# Patient Record
Sex: Female | Born: 1935
Health system: Southern US, Community
[De-identification: ages and names within clinical notes are randomized; demographics above are authoritative.]

## PROBLEM LIST (undated history)

## (undated) DIAGNOSIS — G610 Guillain-Barre syndrome: Secondary | ICD-10-CM

## (undated) DIAGNOSIS — A0472 Enterocolitis due to Clostridium difficile, not specified as recurrent: Secondary | ICD-10-CM

## (undated) DIAGNOSIS — H9313 Tinnitus, bilateral: Secondary | ICD-10-CM

## (undated) DIAGNOSIS — K7689 Other specified diseases of liver: Secondary | ICD-10-CM

## (undated) DIAGNOSIS — R011 Cardiac murmur, unspecified: Secondary | ICD-10-CM

## (undated) DIAGNOSIS — C539 Malignant neoplasm of cervix uteri, unspecified: Secondary | ICD-10-CM

## (undated) DIAGNOSIS — T8859XA Other complications of anesthesia, initial encounter: Secondary | ICD-10-CM

## (undated) DIAGNOSIS — I82409 Acute embolism and thrombosis of unspecified deep veins of unspecified lower extremity: Secondary | ICD-10-CM

## (undated) DIAGNOSIS — C801 Malignant (primary) neoplasm, unspecified: Secondary | ICD-10-CM

## (undated) DIAGNOSIS — T4145XA Adverse effect of unspecified anesthetic, initial encounter: Secondary | ICD-10-CM

## (undated) DIAGNOSIS — C189 Malignant neoplasm of colon, unspecified: Secondary | ICD-10-CM

## (undated) DIAGNOSIS — Z5189 Encounter for other specified aftercare: Secondary | ICD-10-CM

## (undated) DIAGNOSIS — D649 Anemia, unspecified: Secondary | ICD-10-CM

## (undated) DIAGNOSIS — I10 Essential (primary) hypertension: Secondary | ICD-10-CM

## (undated) DIAGNOSIS — I639 Cerebral infarction, unspecified: Secondary | ICD-10-CM

## (undated) HISTORY — DX: Cardiac murmur, unspecified: R01.1

## (undated) HISTORY — DX: Encounter for other specified aftercare: Z51.89

## (undated) HISTORY — DX: Anemia, unspecified: D64.9

## (undated) HISTORY — DX: Malignant neoplasm of colon, unspecified: C18.9

## (undated) HISTORY — PX: ABDOMINAL HYSTERECTOMY: SHX81

## (undated) HISTORY — DX: Malignant neoplasm of cervix uteri, unspecified: C53.9

---

## 1966-08-15 HISTORY — PX: TUBAL LIGATION: SHX77

## 1970-08-15 DIAGNOSIS — C539 Malignant neoplasm of cervix uteri, unspecified: Secondary | ICD-10-CM

## 1970-08-15 HISTORY — DX: Malignant neoplasm of cervix uteri, unspecified: C53.9

## 1979-08-16 HISTORY — PX: BACK SURGERY: SHX140

## 2012-04-30 ENCOUNTER — Other Ambulatory Visit (HOSPITAL_COMMUNITY)
Admission: RE | Admit: 2012-04-30 | Discharge: 2012-04-30 | Disposition: A | Discharge status: Home / Self Care | Payer: BC Managed Care – PPO | Source: Ambulatory Visit | Attending: Family Medicine | Admitting: Family Medicine

## 2012-04-30 DIAGNOSIS — Z124 Encounter for screening for malignant neoplasm of cervix: Secondary | ICD-10-CM | POA: Insufficient documentation

## 2013-08-15 DIAGNOSIS — Z5189 Encounter for other specified aftercare: Secondary | ICD-10-CM

## 2013-08-15 DIAGNOSIS — C189 Malignant neoplasm of colon, unspecified: Secondary | ICD-10-CM

## 2013-08-15 HISTORY — DX: Encounter for other specified aftercare: Z51.89

## 2013-08-15 HISTORY — DX: Malignant neoplasm of colon, unspecified: C18.9

## 2013-09-03 ENCOUNTER — Emergency Department (HOSPITAL_COMMUNITY): Payer: Medicare Other

## 2013-09-03 ENCOUNTER — Observation Stay (HOSPITAL_COMMUNITY)
Admission: EM | Admit: 2013-09-03 | Discharge: 2013-09-05 | Disposition: A | Discharge status: Home / Self Care | Payer: Medicare Other | Attending: Internal Medicine | Admitting: Internal Medicine

## 2013-09-03 ENCOUNTER — Encounter (HOSPITAL_COMMUNITY): Payer: Self-pay | Admitting: Emergency Medicine

## 2013-09-03 DIAGNOSIS — I10 Essential (primary) hypertension: Secondary | ICD-10-CM | POA: Insufficient documentation

## 2013-09-03 DIAGNOSIS — I059 Rheumatic mitral valve disease, unspecified: Secondary | ICD-10-CM | POA: Diagnosis not present

## 2013-09-03 DIAGNOSIS — J449 Chronic obstructive pulmonary disease, unspecified: Secondary | ICD-10-CM | POA: Diagnosis not present

## 2013-09-03 DIAGNOSIS — D649 Anemia, unspecified: Secondary | ICD-10-CM | POA: Diagnosis present

## 2013-09-03 DIAGNOSIS — J4489 Other specified chronic obstructive pulmonary disease: Secondary | ICD-10-CM | POA: Insufficient documentation

## 2013-09-03 DIAGNOSIS — R55 Syncope and collapse: Secondary | ICD-10-CM | POA: Diagnosis present

## 2013-09-03 DIAGNOSIS — R61 Generalized hyperhidrosis: Secondary | ICD-10-CM | POA: Diagnosis not present

## 2013-09-03 DIAGNOSIS — I951 Orthostatic hypotension: Secondary | ICD-10-CM

## 2013-09-03 DIAGNOSIS — C539 Malignant neoplasm of cervix uteri, unspecified: Secondary | ICD-10-CM | POA: Insufficient documentation

## 2013-09-03 DIAGNOSIS — D509 Iron deficiency anemia, unspecified: Secondary | ICD-10-CM | POA: Diagnosis not present

## 2013-09-03 DIAGNOSIS — R0609 Other forms of dyspnea: Secondary | ICD-10-CM | POA: Diagnosis not present

## 2013-09-03 DIAGNOSIS — I079 Rheumatic tricuspid valve disease, unspecified: Secondary | ICD-10-CM | POA: Diagnosis not present

## 2013-09-03 DIAGNOSIS — R0989 Other specified symptoms and signs involving the circulatory and respiratory systems: Secondary | ICD-10-CM | POA: Insufficient documentation

## 2013-09-03 HISTORY — DX: Malignant (primary) neoplasm, unspecified: C80.1

## 2013-09-03 HISTORY — DX: Essential (primary) hypertension: I10

## 2013-09-03 LAB — CBC WITH DIFFERENTIAL/PLATELET
Basophils Absolute: 0.1 10*3/uL (ref 0.0–0.1)
Basophils Absolute: 0.1 10*3/uL (ref 0.0–0.1)
Basophils Relative: 1 % (ref 0–1)
Basophils Relative: 1 % (ref 0–1)
EOS PCT: 4 % (ref 0–5)
Eosinophils Absolute: 0.2 10*3/uL (ref 0.0–0.7)
Eosinophils Absolute: 0.3 10*3/uL (ref 0.0–0.7)
Eosinophils Relative: 3 % (ref 0–5)
HCT: 24.3 % — ABNORMAL LOW (ref 36.0–46.0)
HEMATOCRIT: 26.7 % — AB (ref 36.0–46.0)
HEMOGLOBIN: 8.2 g/dL — AB (ref 12.0–15.0)
Hemoglobin: 7.2 g/dL — ABNORMAL LOW (ref 12.0–15.0)
LYMPHS ABS: 1.2 10*3/uL (ref 0.7–4.0)
LYMPHS ABS: 2 10*3/uL (ref 0.7–4.0)
LYMPHS PCT: 24 % (ref 12–46)
Lymphocytes Relative: 15 % (ref 12–46)
MCH: 19 pg — ABNORMAL LOW (ref 26.0–34.0)
MCH: 20.3 pg — ABNORMAL LOW (ref 26.0–34.0)
MCHC: 29.6 g/dL — ABNORMAL LOW (ref 30.0–36.0)
MCHC: 30.7 g/dL (ref 30.0–36.0)
MCV: 64.1 fL — AB (ref 78.0–100.0)
MCV: 66.1 fL — AB (ref 78.0–100.0)
MONOS PCT: 8 % (ref 3–12)
Monocytes Absolute: 0.5 10*3/uL (ref 0.1–1.0)
Monocytes Absolute: 0.7 10*3/uL (ref 0.1–1.0)
Monocytes Relative: 6 % (ref 3–12)
NEUTROS ABS: 5.1 10*3/uL (ref 1.7–7.7)
Neutro Abs: 5.7 10*3/uL (ref 1.7–7.7)
Neutrophils Relative %: 75 % (ref 43–77)
Neutrophils Relative %: 63 % (ref 43–77)
PLATELETS: 402 10*3/uL — AB (ref 150–400)
Platelets: 380 10*3/uL (ref 150–400)
RBC: 3.79 MIL/uL — AB (ref 3.87–5.11)
RBC: 4.04 MIL/uL (ref 3.87–5.11)
RDW: 16.7 % — ABNORMAL HIGH (ref 11.5–15.5)
RDW: 18.7 % — AB (ref 11.5–15.5)
WBC: 7.7 10*3/uL (ref 4.0–10.5)
WBC: 8.2 10*3/uL (ref 4.0–10.5)

## 2013-09-03 LAB — COMPREHENSIVE METABOLIC PANEL
ALK PHOS: 41 U/L (ref 39–117)
ALT: 9 U/L (ref 0–35)
AST: 19 U/L (ref 0–37)
Albumin: 3.3 g/dL — ABNORMAL LOW (ref 3.5–5.2)
BUN: 19 mg/dL (ref 6–23)
CHLORIDE: 104 meq/L (ref 96–112)
CO2: 22 mEq/L (ref 19–32)
CREATININE: 0.83 mg/dL (ref 0.50–1.10)
Calcium: 9 mg/dL (ref 8.4–10.5)
GFR calc Af Amer: 78 mL/min — ABNORMAL LOW (ref >90)
GFR, EST NON AFRICAN AMERICAN: 67 mL/min — AB (ref >90)
Glucose, Bld: 113 mg/dL — ABNORMAL HIGH (ref 70–99)
Potassium: 4.9 mEq/L (ref 3.7–5.3)
SODIUM: 140 meq/L (ref 137–147)
Total Bilirubin: 0.3 mg/dL (ref 0.3–1.2)
Total Protein: 6.7 g/dL (ref 6.0–8.3)

## 2013-09-03 LAB — PROTIME-INR
INR: 1.04 (ref 0.00–1.49)
Prothrombin Time: 13.4 seconds (ref 11.6–15.2)

## 2013-09-03 LAB — IRON AND TIBC
IRON: 10 ug/dL — AB (ref 42–135)
Saturation Ratios: 2 % — ABNORMAL LOW (ref 20–55)
TIBC: 444 ug/dL (ref 250–470)
UIBC: 434 ug/dL — AB (ref 125–400)

## 2013-09-03 LAB — TROPONIN I: Troponin I: <0.3 ng/mL (ref <0.30)

## 2013-09-03 LAB — RETICULOCYTES
RBC.: 3.77 MIL/uL — ABNORMAL LOW (ref 3.87–5.11)
Retic Count, Absolute: 41.5 10*3/uL (ref 19.0–186.0)
Retic Ct Pct: 1.1 % (ref 0.4–3.1)

## 2013-09-03 LAB — PREPARE RBC (CROSSMATCH)

## 2013-09-03 LAB — ABO/RH: ABO/RH(D): A POS

## 2013-09-03 LAB — D-DIMER, QUANTITATIVE: D-Dimer, Quant: 0.68 ug/mL-FEU — ABNORMAL HIGH (ref 0.00–0.48)

## 2013-09-03 LAB — OCCULT BLOOD, POC DEVICE: Fecal Occult Bld: NEGATIVE

## 2013-09-03 LAB — FERRITIN: Ferritin: 3 ng/mL — ABNORMAL LOW (ref 10–291)

## 2013-09-03 LAB — VITAMIN B12: Vitamin B-12: 345 pg/mL (ref 211–911)

## 2013-09-03 LAB — FOLATE

## 2013-09-03 LAB — POCT I-STAT TROPONIN I: Troponin i, poc: 0.01 ng/mL (ref 0.00–0.08)

## 2013-09-03 MED ORDER — ACETAMINOPHEN 650 MG RE SUPP: 650.0000 mg | Freq: Four times a day (QID) | RECTAL | Status: DC | PRN

## 2013-09-03 MED ORDER — SODIUM CHLORIDE 0.9 % IV SOLN
INTRAVENOUS | Status: DC
  Administered 2013-09-03 – 2013-09-05 (×3): via INTRAVENOUS

## 2013-09-03 MED ORDER — ACETAMINOPHEN 325 MG PO TABS: 650.0000 mg | ORAL_TABLET | Freq: Four times a day (QID) | ORAL | Status: DC | PRN

## 2013-09-03 NOTE — ED Provider Notes (Signed)
CSN: [id number]     Arrival date & time 09/03/13  1142 History   First MD Initiated Contact with Patient 09/03/13 1147     Chief Complaint  Patient presents with  . Near Syncope   (Consider location/radiation/quality/duration/timing/severity/associated sxs/prior Treatment) HPI  Is a 78 year old female who presents the emergency department chief complaint of near syncope.  The patient has a past medical history of hypertension.  She is otherwise healthy and takes no medications.  Patient states that today while at her church she picked up a stool to move it to another room.  She states that by the time she got there she became extremely short of breath, diaphoretic, very lightheaded.  Patient states she had to sit down.  She felt as if she were going to lose consciousness.  Her daughter states that she was unable to get her words out.  Patient did not fully collapse.  Lasted less than 30 seconds.  Patient was brought to the ED via EMS.  She denies any racing or skipping in her heart prior to the event.  She has had no recent illnesses, denies any melena or hematochezia.  No history of anemia.  The patient does endorse progressively worsening shortness of breath since the end of summer.  She states that her dyspnea is exertional.  Patient states she was walking to have mild daily before the end of summer when it became too hot.  She has not been exercising since that time.  She did not feel that her shortness of breath is due to deconditioning.  Patient is able to complete ADLs.  She states that dyspnea occurs with forceful movement such as carrying heavy objects or pushing her garbage can into the driveway.  She states this is new and has never happened before.  The patient does have a murmur which was evaluated by cardiologist. Denies fevers, chills, myalgias, arthralgias. Denies DOE, SOB, chest tightness or pressure, radiation to left arm, jaw or back, or diaphoresis. Denies dysuria, flank pain,  suprapubic pain, frequency, urgency, or hematuria. Denies headaches weakness, visual disturbances. Denies abdominal pain, nausea, vomiting, diarrhea or constipation.   Past Medical History  Diagnosis Date  . Hypertension   . Cancer    Past Surgical History  Procedure Laterality Date  . Back surgery     Family History  Problem Relation Age of Onset  . Diabetes Sister   . Diabetes Brother   . Heart failure Brother   . Hypertension Brother    History  Substance Use Topics  . Smoking status: Never Smoker   . Smokeless tobacco: Never Used  . Alcohol Use: No   OB History   Grav Para Term Preterm Abortions TAB SAB Ect Mult Living                 Review of Systems Ten systems reviewed and are negative for acute change, except as noted in the HPI.   Allergies  Review of patient's allergies indicates no known allergies.  Home Medications  No current outpatient prescriptions on file. BP 132/76  Pulse 84  Resp 14  SpO2 100% Physical Exam Physical Exam  Nursing note and vitals reviewed. Constitutional: She is oriented to person, place, and time. She appears well-developed and well-nourished. No distress.  HENT:  Head: Normocephalic and atraumatic.  Eyes: Conjunctivae normal and EOM are normal. Pupils are equal, round, and reactive to light. No scleral icterus.  Neck: Normal range of motion.  Cardiovascular: Normal rate, regular rhythm and  normal heart sounds.  Exam reveals no gallop and no friction rub.   Systolic  Pulmonary/Chest: Effort normal and breath sounds normal. No respiratory distress.  Abdominal: Soft. Bowel sounds are normal. She exhibits no distension and no mass. There is no tenderness. There is no guarding.  Neurological: She is alert and oriented to person, place, and time. Marland Kitchen Speech is clear and goal oriented, follows commands Major Cranial nerves without deficit, no facial droop Normal strength in upper and lower extremities bilaterally including  dorsiflexion and plantar flexion, strong and equal grip strength Sensation normal to light and sharp touch Moves extremities without ataxia, coordination intact Normal finger to nose and rapid alternating movements Neg romberg, no pronator drift Normal gait Normal heel-shin and balance Skin: Skin is warm and dry. She is not diaphoretic.    ED Course  Procedures (including critical care time) Labs Review Labs Reviewed  CBC WITH DIFFERENTIAL - Abnormal; Notable for the following:    RBC 3.79 (*)    Hemoglobin 7.2 (*)    HCT 24.3 (*)    MCV 64.1 (*)    MCH 19.0 (*)    MCHC 29.6 (*)    RDW 16.7 (*)    Platelets 402 (*)    All other components within normal limits  COMPREHENSIVE METABOLIC PANEL - Abnormal; Notable for the following:    Glucose, Bld 113 (*)    Albumin 3.3 (*)    GFR calc non Af Amer 67 (*)    GFR calc Af Amer 78 (*)    All other components within normal limits  PROTIME-INR  VITAMIN B12  FOLATE  IRON AND TIBC  FERRITIN  RETICULOCYTES  POCT I-STAT TROPONIN I  OCCULT BLOOD, POC DEVICE  POCT CBG (FASTING - GLUCOSE)-MANUAL ENTRY  TYPE AND SCREEN  PREPARE RBC (CROSSMATCH)  ABO/RH   Imaging Review No results found.  EKG Interpretation   None       MDM   1. Symptomatic anemia   2. Orthostasis    1:00 PM Patient with near syncope. Normal neuro. Assessment appears to have low hemoglobin.  At 7.2.  I've ordered occult stool card.   1:29 PM Stool negative. She is orthostatic. I feel she will need admission for transfusion and symptomatic anemia  2:10 PM Patient will be admitted by Dr. Clementeen Graham. Patient agrees with poc. The patient appears reasonably stabilized for admission considering the current resources, flow, and capabilities available in the ED at this time, and I doubt any other [facility] requiring further screening and/or treatment in the ED prior to admission.    Margarita Mail, PA-C 09/03/13 1411

## 2013-09-03 NOTE — ED Notes (Signed)
Per EMS- patient was at church working and suddenly became weak, pale nauseated, and diaphoretic, but did not pass out or hit her head. EMS stated paatient was awake and alert upon arrival. 12 lead unremarkable.

## 2013-09-03 NOTE — Progress Notes (Signed)
Report from Freda Munro, [place] in ED.

## 2013-09-03 NOTE — ED Notes (Signed)
Bed: HK32 Expected date:  Expected time:  Means of arrival:  Comments: EMS-syncopal episode

## 2013-09-03 NOTE — H&P (Addendum)
[facility] History and Physical  Kasen Sako UXN:[id number] DOB: 04-17-1936 DOA: 09/03/2013  Referring physician: ED  PCP: Alessandra Grout, MD  Chief Complaint:  Near syncope  HPI:  78 year old female with a history of hypertension not on any medications who was doing regular household chores at home and moving a stool in the kitchen suddenly felt short of breath and diaphoretic with dizziness and had a near-syncopal event. Her daughter held her before she slumped on the down and the symptoms lasted for less than 1 minute. Patient reports that she could hear her daughter talking. She denies any chest pain, palpitations, headache, blurred vision, denies any nausea or vomiting. Patient reports dyspnea on exertion and fatigue for past 5-6 months. She denies change in her weight or appetite. She denies taking any NSAID's. denies urinary symptoms. Denies dark stools or hematemesis. She denies any leg swellings or recent travel. Denies history of blood clots. Denies any sick contacts. She has not had EGD or colonoscopy in the past. Denies history of colon cancer in family.  Course in the ED orthostatic BP positive in ED. CBC showed hb of 7.2 and suggestive of microcytic anemia. Stool foe occult blood was negative. CXR negative. EKG was unremarkable. l. [facility] consulted for admission under observation on telemetry.   Review of Systems:  Constitutional: Diaphoresis and fatigue, Denies fever, chills, , appetite change. HEENT: Denies photophobia, eye pain, redness, hearing loss, ear pain, congestion, sore throat, rhinorrhea, sneezing, mouth sores, trouble swallowing, neck pain, neck stiffness and tinnitus.   Respiratory: Dyspnea on exertion, Denies  cough, chest tightness,  and wheezing.   Cardiovascular: Denies chest pain, palpitations and leg swelling.  Gastrointestinal: Denies nausea, vomiting, abdominal pain, diarrhea, constipation, blood in stool and abdominal distention.   Genitourinary: Denies dysuria, urgency, frequency, hematuria, flank pain and difficulty urinating.  Endocrine: Denies: hot or cold intolerance, sweats, changes in hair or nails, polyuria, polydipsia. Musculoskeletal: Denies myalgias, back pain, joint swelling, arthralgias and gait problem.  Skin: Denies pallor, rash and wound.  Neurological:  near syncope, dizziness, deniesseizures, syncope, weakness, light-headedness, numbness and headaches.  Psychiatric/Behavioral: Denies confusion, nervousness, sleep disturbance and agitation   Past Medical History  Diagnosis Date  . Hypertension   . Cancer    Past Surgical History  Procedure Laterality Date  . Back surgery     Social History:  reports that she has never smoked. She has never used smokeless tobacco. She reports that she does not drink alcohol or use illicit drugs.  No Known Allergies  Family History  Problem Relation Age of Onset  . Diabetes Sister   . Diabetes Brother   . Heart failure Brother   . Hypertension Brother     Prior to Admission medications   Not on File    Physical Exam:  Filed Vitals:   09/03/13 1232 09/03/13 1300 09/03/13 1459 09/03/13 1513  BP: 123/73 128/67 156/64 146/80  Pulse: 98 77 92 87  Temp:   98.2 F (36.8 C) 98.6 F (37 C)  TempSrc:   Oral Oral  Resp:  16 18 18   Height:   5' 1.5" (1.562 m)   Weight:   61.236 kg (135 lb)   SpO2:  100% 100% 100%    Constitutional: Vital signs reviewed.  Patient is an elderly female lying in bed in no acute distress HEENT: Pallor present, no icterus, moist oral mucosa Cardiovascular: RRR, S1 normal, S2 normal, no MRG, Pulmonary/Chest: CTAB, no wheezes, rales, or rhonchi Abdominal: Soft. Non-tender, non-distended, bowel  sounds are normal, no masses, organomegaly, or guarding present.   extremities: Warm, no edema CNS: AAO x3  Labs on Admission:  Basic Metabolic Panel:  Recent Labs Lab 09/03/13 1232  NA 140  K 4.9  CL 104  CO2 22  GLUCOSE  113*  BUN 19  CREATININE 0.83  CALCIUM 9.0   Liver Function Tests:  Recent Labs Lab 09/03/13 1232  AST 19  ALT 9  ALKPHOS 41  BILITOT 0.3  PROT 6.7  ALBUMIN 3.3*   No results found for this basename: LIPASE, AMYLASE,  in the last 168 hours No results found for this basename: AMMONIA,  in the last 168 hours CBC:  Recent Labs Lab 09/03/13 1232  WBC 7.7  NEUTROABS 5.7  HGB 7.2*  HCT 24.3*  MCV 64.1*  PLT 402*   Cardiac Enzymes: No results found for this basename: CKTOTAL, CKMB, CKMBINDEX, TROPONINI,  in the last 168 hours BNP: No components found with this basename: POCBNP,  CBG: No results found for this basename: GLUCAP,  in the last 168 hours  Radiological Exams on Admission: Dg Chest 2 View  09/03/2013   CLINICAL DATA:  Near syncope  EXAM: CHEST  2 VIEW  COMPARISON:  None.  FINDINGS: Mild pulmonary hyperinflation is seen, consistent with COPD. No evidence of pulmonary infiltrate or edema. No evidence pleural effusion. Heart size is within normal limits. No mass or lymphadenopathy identified. No suspicious bone lesions seen.  IMPRESSION: COPD.  No active disease.   Electronically Signed   By: Earle Gell M.D.   On: 09/03/2013 13:36    EKG: Sinus rhythm, no ST-T changes  Assessment/Plan  Principal Problem: Near syncope Patient was to for orthostatic blood pressure and microcytic anemia. Monitor on telemetry. We'll check serial troponins. Check d-dimer.  Transfuse 1 unit PRBC. Continue IV normal saline -stool for occult  blood was negative in ED. Symptoms of dyspnea on exertion and fatigue ongoing for few  Months. Monitor serial H&H. Check iron panel  ferritin, B12 , TSH -recheck orthostasis in am. Has not had GI w/up in the past and will benefit from outpt EGD/ colonoscopy.     DVT prophylaxis: SCDs  Code Status: full code Family Communication: Daughter at bedside Disposition Plan: home Tomorrow if H&H stable  Louellen Molder [facility] Pager [phone]  If 7PM-7AM, please contact night-coverage [email or web address] Password [facility] 09/03/2013, 3:36 PM   Total time spent: 45 minutes

## 2013-09-03 NOTE — Progress Notes (Signed)
Pt arrived from ED on stretcher. Amb to bed w/ one stand by assist. Oriented to callbell and environment. POC discussed w/ pt and dtr. Aware of need for blood transfusion and agreeable. VSS.

## 2013-09-04 ENCOUNTER — Encounter (HOSPITAL_COMMUNITY): Payer: Self-pay | Admitting: Radiology

## 2013-09-04 ENCOUNTER — Observation Stay (HOSPITAL_COMMUNITY): Payer: Medicare Other

## 2013-09-04 DIAGNOSIS — I951 Orthostatic hypotension: Secondary | ICD-10-CM | POA: Diagnosis not present

## 2013-09-04 DIAGNOSIS — D509 Iron deficiency anemia, unspecified: Secondary | ICD-10-CM | POA: Diagnosis not present

## 2013-09-04 DIAGNOSIS — C539 Malignant neoplasm of cervix uteri, unspecified: Secondary | ICD-10-CM | POA: Diagnosis not present

## 2013-09-04 DIAGNOSIS — R0609 Other forms of dyspnea: Secondary | ICD-10-CM | POA: Diagnosis not present

## 2013-09-04 LAB — TYPE AND SCREEN
ABO/RH(D): A POS
Antibody Screen: NEGATIVE
UNIT DIVISION: 0

## 2013-09-04 LAB — GLUCOSE, CAPILLARY: GLUCOSE-CAPILLARY: 96 mg/dL (ref 70–99)

## 2013-09-04 LAB — TROPONIN I
Troponin I: <0.3 ng/mL (ref <0.30)
Troponin I: <0.3 ng/mL (ref <0.30)

## 2013-09-04 LAB — TSH: TSH: 1.571 u[IU]/mL (ref 0.350–4.500)

## 2013-09-04 MED ORDER — SODIUM CHLORIDE 0.9 % IV SOLN
125.0000 mg | Freq: Once | INTRAVENOUS | Status: AC
  Administered 2013-09-04: 125 mg via INTRAVENOUS
  Filled 2013-09-04: qty 10

## 2013-09-04 MED ORDER — IOHEXOL 350 MG/ML SOLN
100.0000 mL | Freq: Once | INTRAVENOUS | Status: AC | PRN
  Administered 2013-09-04: 100 mL via INTRAVENOUS

## 2013-09-04 MED ORDER — FERROUS SULFATE 325 (65 FE) MG PO TABS
325.0000 mg | ORAL_TABLET | Freq: Two times a day (BID) | ORAL | Status: DC
  Administered 2013-09-04 – 2013-09-05 (×2): 325 mg via ORAL
  Filled 2013-09-04 (×4): qty 1

## 2013-09-04 NOTE — Progress Notes (Signed)
[facility] PROGRESS NOTE  Cathy Crounse POE:[id number] DOB: January 17, 1936 DOA: 09/03/2013 PCP: [name], MD  Assessment/Plan: Orthostatic hypotension -Continue IV fluids Near syncope/dyspnea on exertion -Echocardiogram -Partly due to symptomatic anemia -Transfused 1 unit PRBC -FOBT negative -Troponin negative x3 Symptomatic anemia -Partly due to iron deficiency-- iron saturation 2% -Nulecit IV x 1 -Start ferrous sulfate 325 mg twice a day -FOBT negative Elevated d-dimer -CT angiogram chest   Family Communication:   No family at beside Disposition Plan:   Home when medically stable       Procedures/Studies: Dg Chest 2 View  09/03/2013   CLINICAL DATA:  Near syncope  EXAM: CHEST  2 VIEW  COMPARISON:  None.  FINDINGS: Mild pulmonary hyperinflation is seen, consistent with COPD. No evidence of pulmonary infiltrate or edema. No evidence pleural effusion. Heart size is within normal limits. No mass or lymphadenopathy identified. No suspicious bone lesions seen.  IMPRESSION: COPD.  No active disease.   Electronically Signed   By: Earle Gell M.D.   On: 09/03/2013 13:36         Subjective: Patient is feeling better today. Denies fevers, chills, chest discomfort, shortness of breath, dizziness, nausea, vomiting, diarrhea, abdominal pain, dysuria, hematuria.  Objective: Filed Vitals:   09/03/13 1745 09/03/13 1812 09/03/13 2037 09/04/13 0443  BP: 138/69 150/64 133/67 149/72  Pulse: 86 84 66 68  Temp: 98.2 F (36.8 C) 98.2 F (36.8 C) 98.7 F (37.1 C) 98.4 F (36.9 C)  TempSrc: Oral Oral Oral Oral  Resp: 18 16 17 16   Height:      Weight:      SpO2: 99%  97% 99%    Intake/Output Summary (Last 24 hours) at 09/04/13 1306 Last data filed at 09/04/13 0559  Gross per 24 hour  Intake   1820 ml  Output      0 ml  Net   1820 ml   Weight change:  Exam:   General:  Pt is alert, follows commands appropriately, not in acute distress  HEENT: No icterus,  No thrush, /AT  Cardiovascular: RRR, S1/S2, no rubs, no gallops  Respiratory: CTA bilaterally, no wheezing, no crackles, no rhonchi  Abdomen: Soft/+BS, non tender, non distended, no guarding  Extremities: No edema, No lymphangitis, No petechiae, No rashes, no synovitis  Data Reviewed: Basic Metabolic Panel:  Recent Labs Lab 09/03/13 1232  NA 140  K 4.9  CL 104  CO2 22  GLUCOSE 113*  BUN 19  CREATININE 0.83  CALCIUM 9.0   Liver Function Tests:  Recent Labs Lab 09/03/13 1232  AST 19  ALT 9  ALKPHOS 41  BILITOT 0.3  PROT 6.7  ALBUMIN 3.3*   No results found for this basename: LIPASE, AMYLASE,  in the last 168 hours No results found for this basename: AMMONIA,  in the last 168 hours CBC:  Recent Labs Lab 09/03/13 1232 09/03/13 1954  WBC 7.7 8.2  NEUTROABS 5.7 5.1  HGB 7.2* 8.2*  HCT 24.3* 26.7*  MCV 64.1* 66.1*  PLT 402* 380   Cardiac Enzymes:  Recent Labs Lab 09/03/13 1954 09/04/13 0201 09/04/13 0755  TROPONINI <0.30 <0.30 <0.30   BNP: No components found with this basename: POCBNP,  CBG:  Recent Labs Lab 09/04/13 1130  GLUCAP 96    No results found for this or any previous visit (from the past 240 hour(s)).   Scheduled Meds: . ferric gluconate (FERRLECIT/NULECIT) IV  125 mg Intravenous Once  . ferrous sulfate  325 mg Oral BID  WC   Continuous Infusions: . sodium chloride 100 mL/hr at 09/04/13 0559     Laporscha Linehan, DO  [facility] Pager [phone]  If 7PM-7AM, please contact night-coverage [email or web address] Password [email or web address] 09/04/2013, 1:06 PM   LOS: 1 day

## 2013-09-05 DIAGNOSIS — I059 Rheumatic mitral valve disease, unspecified: Secondary | ICD-10-CM

## 2013-09-05 LAB — CBC
HEMATOCRIT: 26.8 % — AB (ref 36.0–46.0)
Hemoglobin: 7.8 g/dL — ABNORMAL LOW (ref 12.0–15.0)
MCH: 19.7 pg — ABNORMAL LOW (ref 26.0–34.0)
MCHC: 29.1 g/dL — ABNORMAL LOW (ref 30.0–36.0)
MCV: 67.8 fL — ABNORMAL LOW (ref 78.0–100.0)
PLATELETS: 329 10*3/uL (ref 150–400)
RBC: 3.95 MIL/uL (ref 3.87–5.11)
RDW: 18.8 % — ABNORMAL HIGH (ref 11.5–15.5)
WBC: 6.8 10*3/uL (ref 4.0–10.5)

## 2013-09-05 LAB — BASIC METABOLIC PANEL
BUN: 8 mg/dL (ref 6–23)
CHLORIDE: 108 meq/L (ref 96–112)
CO2: 25 meq/L (ref 19–32)
Calcium: 8 mg/dL — ABNORMAL LOW (ref 8.4–10.5)
Creatinine, Ser: 0.79 mg/dL (ref 0.50–1.10)
GFR calc non Af Amer: 79 mL/min — ABNORMAL LOW (ref >90)
Glucose, Bld: 91 mg/dL (ref 70–99)
Potassium: 4.2 mEq/L (ref 3.7–5.3)
Sodium: 141 mEq/L (ref 137–147)

## 2013-09-05 MED ORDER — FERROUS SULFATE 325 (65 FE) MG PO TABS: 325.0000 mg | ORAL_TABLET | Freq: Two times a day (BID) | ORAL | Status: DC

## 2013-09-05 NOTE — ED Provider Notes (Signed)
Medical screening examination/treatment/procedure(s) were conducted as a shared visit with non-physician practitioner(s) and myself.  I personally evaluated the patient during the encounter.  EKG Interpretation    Date/Time:  Tuesday September 03 2013 12:02:08 EST Ventricular Rate:  84 PR Interval:  157 QRS Duration: 89 QT Interval:  379 QTC Calculation: 448 R Axis:   38 Text Interpretation:  Sinus rhythm Abnormal inferior Q waves ED PHYSICIAN INTERPRETATION AVAILABLE IN [facility] Confirmed by TEST, RECORD ([id number]) on 09/05/2013 10:15:33 AM            Cynthia [name] is a 78 y.o. female hx of anemia, HTN here with near syncope. SOB with exertion for several weeks. Today, almost passed out. No black stool, no vomiting. No chest pain. Hg 7.2, occ neg. I think she has symptomatic anemia, will transfuse and admit.   CRITICAL CARE Performed by: Darl Householder, DAVID   Total critical care time: 30 min   Critical care time was exclusive of separately billable procedures and treating other patients.  Critical care was necessary to treat or prevent imminent or life-threatening deterioration.  Critical care was time spent personally by me on the following activities: development of treatment plan with patient and/or surrogate as well as nursing, discussions with consultants, evaluation of patient's response to treatment, examination of patient, obtaining history from patient or surrogate, ordering and performing treatments and interventions, ordering and review of laboratory studies, ordering and review of radiographic studies, pulse oximetry and re-evaluation of patient's condition.     Wandra Arthurs, MD 09/05/13 [phone]

## 2013-09-05 NOTE — Progress Notes (Signed)
Echocardiogram 2D Echocardiogram has been performed.  Joelene Millin 09/05/2013, 9:03 AM

## 2013-09-05 NOTE — Progress Notes (Signed)
D/C instructions reviewed w/ pt and dtr. All questions answered, no further questions. Pt d/c in w/c by NT in stable condition. Pt in possession of d/c instructions, script for iron, and all personal belongings. Pt d/c to dtr's car.

## 2013-09-05 NOTE — Discharge Summary (Signed)
Physician Discharge Summary  Cynthia [name] UUV:[id number] DOB: [birth year] DOA: 09/03/2013  PCP: [name], MD  Admit date: 09/03/2013 Discharge date: 09/05/2013  Recommendations for Outpatient Follow-up:  1. Pt will need to follow up with PCP in 2 weeks post discharge 2. Please obtain BMP to evaluate electrolytes and kidney function 3. Please also check CBC to evaluate Hg and Hct levels 4. Please followup on echocardiogram results performed 09/05/2013  Discharge Diagnoses:  Principal Problem:   Symptomatic anemia Active Problems:   Orthostasis   Near syncope   Iron deficiency anemia Orthostatic hypotension  -Continue IV fluids  -Clinically improved with fluids Near syncope/dyspnea on exertion  -Echocardiogram --results pending at the time of discharge -Partly due to symptomatic anemia  -Transfused 1 unit PRBC--> hemoglobin remained stable after transfusion  -FOBT negative  -Troponin negative x3  -Symptomatically improved after blood transfusion Symptomatic anemia  -Partly due to iron deficiency-- iron saturation 2%  -Nulecit IV x 1  -Start ferrous sulfate 325 mg twice a day  -FOBT negative  Elevated d-dimer  -CT angiogram chest--negative for pulmonary embolus -Patchy airspace disease on CT angiogram of the chest likely represent atelectasis as the patient has no fever, dyspnea, or leukocytosis  Discharge Condition: Stable  Disposition:  home  Diet: Regular Wt Readings from Last 3 Encounters:  09/03/13 61.236 kg (135 lb)    History of present illness:  78 year old female with a history of hypertension not on any medications who was doing regular household chores at home and moving a stool in the kitchen suddenly felt short of breath and diaphoretic with dizziness and had a near-syncopal event. Her daughter held her before she slumped on the down and the symptoms lasted for less than 1 minute. Patient reports that she could hear her daughter talking. She denies  any chest pain, palpitations, headache, blurred vision, denies any nausea or vomiting. Patient reports dyspnea on exertion and fatigue for past 5-6 months. She denies change in her weight or appetite. She denies taking any NSAID's. denies urinary symptoms. Denies dark stools or hematemesis. She denies any leg swellings or recent travel. Denies history of blood clots. Denies any sick contacts.      Discharge Exam: Filed Vitals:   09/05/13 0530  BP: 153/68  Pulse: 78  Temp: 98.2 F (36.8 C)  Resp: 16   Filed Vitals:   09/04/13 0443 09/04/13 1438 09/04/13 2115 09/05/13 0530  BP: 149/72 136/72 128/77 153/68  Pulse: 68 77 72 78  Temp: 98.4 F (36.9 C) 98.2 F (36.8 C) 98.2 F (36.8 C) 98.2 F (36.8 C)  TempSrc: Oral Oral Oral Oral  Resp: 16 15 18 16   Height:      Weight:      SpO2: 99% 100% 95% 98%   General: A&O x 3, NAD, pleasant, cooperative Cardiovascular: RRR, no rub, no gallop, no S3 Respiratory: CTAB, no wheeze, no rhonchi Abdomen:soft, nontender, nondistended, positive bowel sounds Extremities: No edema, No lymphangitis, no petechiae  Discharge Instructions      Discharge Orders   Future Orders Complete By Expires   Diet - low sodium heart healthy  As directed    Increase activity slowly  As directed        Medication List         ferrous sulfate 325 (65 FE) MG tablet  Take 1 tablet (325 mg total) by mouth 2 (two) times daily with a meal.         The results of significant diagnostics from this  hospitalization (including imaging, microbiology, ancillary and laboratory) are listed below for reference.    Significant Diagnostic Studies: Dg Chest 2 View  09/03/2013   CLINICAL DATA:  Near syncope  EXAM: CHEST  2 VIEW  COMPARISON:  None.  FINDINGS: Mild pulmonary hyperinflation is seen, consistent with COPD. No evidence of pulmonary infiltrate or edema. No evidence pleural effusion. Heart size is within normal limits. No mass or lymphadenopathy identified. No  suspicious bone lesions seen.  IMPRESSION: COPD.  No active disease.   Electronically Signed   By: Earle Gell M.D.   On: 09/03/2013 13:36   Ct Angio Chest Pe W/cm &/or Wo Cm  09/04/2013   CLINICAL DATA:  Dyspnea on exertion.  Cervical cancer.  EXAM: CT ANGIOGRAPHY CHEST WITH CONTRAST  TECHNIQUE: Multidetector CT imaging of the chest was performed using the standard protocol during bolus administration of intravenous contrast. Multiplanar CT image reconstructions including MIPs were obtained to evaluate the vascular anatomy.  CONTRAST:  134mL OMNIPAQUE IOHEXOL 350 MG/ML SOLN  COMPARISON:  None.  FINDINGS: No filling defects in the pulmonary arterial tree to suggest acute pulmonary thromboembolism  No abnormal mediastinal adenopathy.  No pericardial effusion.  No pneumothorax or pleural effusion.  Linear atelectasis at the lung bases. Minimal patchy airspace disease at the posterior right lung base.  Borderline enlarged bilateral hilar lymph nodes.  No acute bony deformity.  Low-density lesions in the left lobe of the liver are likely benign cysts. Hounsfield unit measurements are 10 or less.  Review of the MIP images confirms the above findings.  IMPRESSION: No evidence of acute pulmonary thromboembolism.  Minimal patchy airspace disease in the posterior basal segment of the right lower lobe.   Electronically Signed   By: Maryclare Bean M.D.   On: 09/04/2013 15:32     Microbiology: No results found for this or any previous visit (from the past 240 hour(s)).   Labs: Basic Metabolic Panel:  Recent Labs Lab 09/03/13 1232 09/05/13 0523  NA 140 141  K 4.9 4.2  CL 104 108  CO2 22 25  GLUCOSE 113* 91  BUN 19 8  CREATININE 0.83 0.79  CALCIUM 9.0 8.0*   Liver Function Tests:  Recent Labs Lab 09/03/13 1232  AST 19  ALT 9  ALKPHOS 41  BILITOT 0.3  PROT 6.7  ALBUMIN 3.3*   No results found for this basename: LIPASE, AMYLASE,  in the last 168 hours No results found for this basename: AMMONIA,   in the last 168 hours CBC:  Recent Labs Lab 09/03/13 1232 09/03/13 1954 09/05/13 0523  WBC 7.7 8.2 6.8  NEUTROABS 5.7 5.1  --   HGB 7.2* 8.2* 7.8*  HCT 24.3* 26.7* 26.8*  MCV 64.1* 66.1* 67.8*  PLT 402* 380 329   Cardiac Enzymes:  Recent Labs Lab 09/03/13 1954 09/04/13 0201 09/04/13 0755  TROPONINI <0.30 <0.30 <0.30   BNP: No components found with this basename: POCBNP,  CBG:  Recent Labs Lab 09/04/13 1130  GLUCAP 96    Time coordinating discharge:  Greater than 30 minutes  Signed:  Owynn Mosqueda, DO [facility] Pager: [phone] 09/05/2013, 11:44 AM

## 2013-09-10 ENCOUNTER — Other Ambulatory Visit: Payer: Self-pay | Admitting: Family Medicine

## 2013-09-10 DIAGNOSIS — Z1231 Encounter for screening mammogram for malignant neoplasm of breast: Secondary | ICD-10-CM

## 2013-09-17 ENCOUNTER — Other Ambulatory Visit: Payer: Self-pay | Admitting: Family Medicine

## 2013-09-17 DIAGNOSIS — D649 Anemia, unspecified: Secondary | ICD-10-CM

## 2013-09-17 DIAGNOSIS — R109 Unspecified abdominal pain: Secondary | ICD-10-CM

## 2013-09-20 ENCOUNTER — Ambulatory Visit
Admission: RE | Admit: 2013-09-20 | Discharge: 2013-09-20 | Disposition: A | Discharge status: Home / Self Care | Payer: Medicare Other | Source: Ambulatory Visit | Attending: Family Medicine | Admitting: Family Medicine

## 2013-09-20 DIAGNOSIS — R109 Unspecified abdominal pain: Secondary | ICD-10-CM

## 2013-09-20 DIAGNOSIS — D649 Anemia, unspecified: Secondary | ICD-10-CM

## 2013-09-20 MED ORDER — IOHEXOL 300 MG/ML  SOLN
100.0000 mL | Freq: Once | INTRAMUSCULAR | Status: AC | PRN
  Administered 2013-09-20: 100 mL via INTRAVENOUS

## 2013-09-25 ENCOUNTER — Other Ambulatory Visit: Payer: Self-pay | Admitting: Gastroenterology

## 2013-09-25 ENCOUNTER — Other Ambulatory Visit (INDEPENDENT_AMBULATORY_CARE_PROVIDER_SITE_OTHER): Payer: Self-pay | Admitting: General Surgery

## 2013-09-25 ENCOUNTER — Telehealth (INDEPENDENT_AMBULATORY_CARE_PROVIDER_SITE_OTHER): Payer: Self-pay

## 2013-09-25 DIAGNOSIS — C189 Malignant neoplasm of colon, unspecified: Secondary | ICD-10-CM

## 2013-09-25 NOTE — Telephone Encounter (Signed)
Spoke with pts daughter and informed her of the appt for pts PET scan at [facility] on 10/04/13 with an arrival time of 8:45am.  Instructed her that pt must be NPO after midnight.  She will make pt aware when she wakes up.  She is agreeable at this time.

## 2013-09-25 NOTE — Telephone Encounter (Signed)
Spoke with the pt's daughter and gave her appt information to see Dr. Dalbert Batman on 09/27/13 at 2:30pm.  Will discuss scheduling the PET scan at that appointment.

## 2013-09-26 ENCOUNTER — Telehealth (INDEPENDENT_AMBULATORY_CARE_PROVIDER_SITE_OTHER): Payer: Self-pay

## 2013-09-26 NOTE — Telephone Encounter (Signed)
Requested notes, colonoscopy report and pathology from Dr. Michail Sermon.  Pt to be seen on 09/27/13 by Dr. Dalbert Batman.

## 2013-09-27 ENCOUNTER — Ambulatory Visit (INDEPENDENT_AMBULATORY_CARE_PROVIDER_SITE_OTHER): Payer: Medicare Other | Admitting: General Surgery

## 2013-09-27 ENCOUNTER — Encounter (INDEPENDENT_AMBULATORY_CARE_PROVIDER_SITE_OTHER): Payer: Self-pay | Admitting: General Surgery

## 2013-09-27 VITALS — BP 172/100 | HR 88 | Temp 97.8°F | Resp 16 | Ht 61.75 in | Wt 133.0 lb

## 2013-09-27 DIAGNOSIS — D649 Anemia, unspecified: Secondary | ICD-10-CM

## 2013-09-27 DIAGNOSIS — C182 Malignant neoplasm of ascending colon: Secondary | ICD-10-CM

## 2013-09-27 LAB — CBC WITH DIFFERENTIAL/PLATELET
BASOS ABS: 0 10*3/uL (ref 0.0–0.1)
BASOS PCT: 1 % (ref 0–1)
Eosinophils Absolute: 0.2 10*3/uL (ref 0.0–0.7)
Eosinophils Relative: 4 % (ref 0–5)
HEMATOCRIT: 35.2 % — AB (ref 36.0–46.0)
HEMOGLOBIN: 10.6 g/dL — AB (ref 12.0–15.0)
LYMPHS PCT: 27 % (ref 12–46)
Lymphs Abs: 1.6 10*3/uL (ref 0.7–4.0)
MCH: 21.9 pg — ABNORMAL LOW (ref 26.0–34.0)
MCHC: 30.1 g/dL (ref 30.0–36.0)
MCV: 72.6 fL — ABNORMAL LOW (ref 78.0–100.0)
MONO ABS: 0.4 10*3/uL (ref 0.1–1.0)
MONOS PCT: 7 % (ref 3–12)
NEUTROS PCT: 61 % (ref 43–77)
Neutro Abs: 3.7 10*3/uL (ref 1.7–7.7)
Platelets: 402 10*3/uL — ABNORMAL HIGH (ref 150–400)
RBC: 4.85 MIL/uL (ref 3.87–5.11)
RDW: 26.3 % — ABNORMAL HIGH (ref 11.5–15.5)
WBC: 5.9 10*3/uL (ref 4.0–10.5)

## 2013-09-27 LAB — COMPREHENSIVE METABOLIC PANEL
ALBUMIN: 4.1 g/dL (ref 3.5–5.2)
ALK PHOS: 39 U/L (ref 39–117)
ALT: 10 U/L (ref 0–35)
AST: 21 U/L (ref 0–37)
BUN: 7 mg/dL (ref 6–23)
CALCIUM: 9.6 mg/dL (ref 8.4–10.5)
CHLORIDE: 104 meq/L (ref 96–112)
CO2: 26 mEq/L (ref 19–32)
Creat: 0.79 mg/dL (ref 0.50–1.10)
GLUCOSE: 98 mg/dL (ref 70–99)
POTASSIUM: 4.1 meq/L (ref 3.5–5.3)
Sodium: 139 mEq/L (ref 135–145)
TOTAL PROTEIN: 6.6 g/dL (ref 6.0–8.3)
Total Bilirubin: 0.5 mg/dL (ref 0.2–1.2)

## 2013-09-27 NOTE — Patient Instructions (Addendum)
You have a colon cancer on the right side of your  colon. This is fairly large and is almost certainly the cause of your anemia.  This has not obstructed your colon yet, but it will in the future.  There may be a spot on the posterior aspect of the right lobe of the liver.  You will be scheduled for a PET CT scan to make sure there is no sign of any cancer spread  You will be scheduled for a laparoscopic right colon resection, possible open resection.  You will need to take a bowel prep the day before surgery.    Laparoscopic Colectomy Laparoscopic colectomy is surgery to remove part or all of the large intestine (colon). This procedure is used to treat several conditions, including:  Inflammation and infection of the colon (diverticulitis).  Tumors or masses in the colon.  Inflammatory bowel disease, such as Crohn disease or ulcerative colitis. Colectomy is an option when symptoms cannot be controlled with medicines.  Bleeding from the colon that cannot be controlled by another method.  Blockage or obstruction of the colon. LET [facility] CARE PROVIDER KNOW ABOUT:  Any allergies you have.  All medicines you are taking, including vitamins, herbs, eye drops, creams, and over-the-counter medicines.  Previous problems you or members of your family have had with the use of anesthetics.  Any blood disorders you have.  Previous surgeries you have had.  Medical conditions you have. RISKS AND COMPLICATIONS Generally, this is a safe procedure. However, as with any procedure, complications can occur. Possible complications include:  Infection.  Bleeding.  Damage to other organs.  Leaking from where the colon was sewn together.  Future blockage of the small intestines from scar tissue. Another surgery may be needed to repair this. In some cases, complications such as damage to other organs or excessive bleeding may require the surgeon to convert from a laparoscopic procedure  to an open procedure. This involves making a larger incision in the abdomen to perform the procedure. BEFORE THE PROCEDURE  Ask your health care provider about changing or stopping any regular medicines.  You may be prescribed an oral bowel prep. This involves drinking a large amount of medicated liquid, starting the day before your surgery. The liquid will cause you to have multiple loose stools until your stool is almost clear or light green. This cleans out your colon in preparation for the surgery.  Do not eat or drink anything else once you have started the bowel prep, unless your health care provider tells you it is safe to do so.  You may also be given antibiotic pills to clean out your colon of bacteria. Be sure to follow the directions carefully and take the medicine at the correct time. PROCEDURE   Small monitors will be put on your body. They are used to check your heart, blood pressure, and oxygen level.  An IV access tube will be put into one of your veins. Medicine will be able to flow directly into your body through this IV tube.  You might be given a medicine to help you relax (sedative).  You will be given a medicine to make you sleep through the procedure (general anesthetic). A breathing tube may be placed into your lungs during the procedure.  A thin, flexible tube (catheter) will be placed into your bladder to collect urine.  A tube may be put in through your nose. It is called a nasogastric tube. It is used to remove stomach  fluids after surgery until the intestines start working again.  Your abdomen will be filled with air so that it expands. This gives the surgeon more room to operate and makes your organs easier to see.  Several small cuts (incisions) are made in your abdomen.  A thin, lighted tube with a tiny camera on the end (laparoscope) is put through one of the small incisions. The camera on the laparoscope sends a picture to a TV screen in the operating  room. This gives the surgeon a good view inside your abdomen.  Hollow tubes are put through the other small incisions in your abdomen. The tools needed for the procedure are put through these tubes.  Clamps or staples are put on both ends of the diseased part of the colon.  The part of the intestine between the clamps or staples is removed.  If possible, the ends of the healthy colon that remain will be stitched or stapled together to allow your body to expel waste (stool).  Sometimes, the remaining colon cannot be stitched back together. If this is the case, a colostomy is needed. For a colostomy:  An opening (stoma) to the outside of your body is made through the abdomen.  The end of the colon is brought to the opening. It is stitched to the skin.  A bag is attached to the opening. Stool will drain into this bag. The bag is removable.  The colostomy can be temporary or permanent.  The incisions from the colectomy are closed with stitches or staples. AFTER THE PROCEDURE  You will be monitored closely in a recovery area until you are stable and doing well. You will then be moved to a regular hospital room.  You will need to receive fluids through an IV tube until your bowel function has returned. This may take 1 3 days. Once your bowels are working again, you will be started on clear liquids and then advanced to solid food as tolerated.  You will be given pain medicines to control your pain. Document Released: 10/22/2002 Document Revised: 05/22/2013 Document Reviewed: 03/13/2013 [facility] Patient Information 2014 [place], [place].

## 2013-09-27 NOTE — Progress Notes (Addendum)
Patient ID: Cynthia [name], female   DOB: June 25, 1936, 78 y.o.   MRN: [id number]  Chief Complaint  Patient presents with  . Colon Cancer    new pt    HPI Cynthia [name] is a 78 y.o. female.  She is referred by Dr. Wilford [name] for evaluation and management of symptomatic adenocarcinoma of the cecum. Her primary care physician Dr. Silvana [name].  This patient has enjoyed good health. She doesn't see doctors much but does get an annual physical. She's never had a colonoscopy. She had a near syncopal episode at church recently, last month, went to the ED and found to be anemic. She was transfused one unit of blood. She was placed on iron, which turned her stools black. . She says she has felt fatigued but denies any change in appetite. Denies seeing blood in her stools or dark stools. Denies abdominal pain nausea or vomiting. Minimal change in weight.  On January 22 her hemoglobin was 7.8. She says it is up to 9.1     I cannot find that report. Basic metabolic panel normal. No other labs.  A CT scan was performed on February 6 shows a 5 cm cecal mass extending into the terminal ileum compatible with a carcinoma. There were suspected extension and the pericolonic fat with adjacent small lymph nodes age and multiple benign-appearing liver cyst. There was a 11 x 16 mm soft tissue implant overlying the posterior right hepatic lobe possibly reflecting a peritoneal nodule.There is a large right adnexal cyst. There was no evidence of obstruction by CT. A PET/CT was recommended  Dr. Michail [name] performed a colonoscopy shortly thereafter and found a large fungating ulcerated partially obstructing mass in the cecum which was circumferential. There was no blood. He was able to visualize the ileocecal valve. Biopsy shows adenocarcinoma.  She is asymptomatic today she is here with her daughter.   Comorbidities include a vaginal hysterectomy in her 46s for cervical cancer. Hypertension. Anemia.  Hyperlipidemia. Mild mitral regurgitation. HPI  Past Medical History  Diagnosis Date  . Hypertension   . Cancer     cervical ca  . Anemia   . Blood transfusion without reported diagnosis   . Colon cancer   . Heart murmur     Past Surgical History  Procedure Laterality Date  . Back surgery    . Abdominal hysterectomy      vaginal- partial    Family History  Problem Relation Age of Onset  . Diabetes Sister   . Cancer Sister     leukemia  . Diabetes Brother   . Heart failure Brother   . Hypertension Brother     Social History History  Substance Use Topics  . Smoking status: Never Smoker   . Smokeless tobacco: Never Used  . Alcohol Use: No    No Known Allergies  No current outpatient prescriptions on file.   No current facility-administered medications for this visit.    Review of Systems Review of Systems  Constitutional: Negative for fever, chills and unexpected weight change.  HENT: Negative for congestion, hearing loss, sore throat, trouble swallowing and voice change.   Eyes: Negative for visual disturbance.  Respiratory: Negative for cough and wheezing.   Cardiovascular: Negative for chest pain, palpitations and leg swelling.  Gastrointestinal: Negative for nausea, vomiting, abdominal pain, diarrhea, constipation, blood in stool, abdominal distention and anal bleeding.  Genitourinary: Negative for hematuria, vaginal bleeding and difficulty urinating.  Musculoskeletal: Negative for arthralgias.  Skin: Negative for rash and wound.  Neurological: Positive for syncope. Negative for seizures and headaches.  Hematological: Negative for adenopathy. Does not bruise/bleed easily.  Psychiatric/Behavioral: Negative for confusion.    Blood pressure 172/100, pulse 88, temperature 97.8 F (36.6 C), temperature source Oral, resp. rate 16, height 5' 1.75" (1.568 m), weight 133 lb (60.328 kg).  Physical Exam Physical Exam  Constitutional: She is oriented to person,  place, and time. She appears well-developed and well-nourished. No distress.  She looks quite good. Alert and healthy. Mild pallor but not bad. Alert and oriented. Daughter is with her throughout the counter.  HENT:  Head: Normocephalic and atraumatic.  Nose: Nose normal.  Mouth/Throat: No oropharyngeal exudate.  Eyes: Conjunctivae and EOM are normal. Pupils are equal, round, and reactive to light. Left eye exhibits no discharge. No scleral icterus.  Neck: Neck supple. No JVD present. No tracheal deviation present. No thyromegaly present.  Cardiovascular: Normal rate, regular rhythm, normal heart sounds and intact distal pulses.   No murmur heard. Pulmonary/Chest: Effort normal and breath sounds normal. No respiratory distress. She has no wheezes. She has no rales. She exhibits no tenderness.  Abdominal: Soft. Bowel sounds are normal. She exhibits no distension and no mass. There is no tenderness. There is no rebound and no guarding.  Question stretch mark versus short vertical incision below umbilicus. Patient not sure whether she have surgery or not.abdomen is soft and nontender. Not distended. No mass. No inguinal adenopathy.  Musculoskeletal: She exhibits no edema and no tenderness.  Lymphadenopathy:    She has no cervical adenopathy.  Neurological: She is alert and oriented to person, place, and time. She exhibits normal muscle tone. Coordination normal.  Skin: Skin is warm. No rash noted. She is not diaphoretic. No erythema. No pallor.  Psychiatric: She has a normal mood and affect. Her behavior is normal. Judgment and thought content normal.    Data Reviewed Referring physician office note. Colonoscopy report. CT scan. Pathology report  Assessment     locally advanced adenocarcinoma of the cecum.There clinically this is circumferential and there is concern that this will obstruct in the future, but she is not clinically obstructed at this point in time.  Question liver or  peritoneal nodule at lower edge of the posterior right lobe of liver  Right adnexal cyst. Consider excision at time of colectomy.  Status post vaginal hysterectomy remote, cervical cancer  Significant anemia, symptomatic resulting in near syncope requiring transfusion.  Hypertension  Mild mitral regurgitation     Plan    I had a long discussion with the patient and her daughter. She was advised to undergo elective laparoscopic-assisted right colectomy, possible open colectomy in the near future. I told her that we would try to assess her liver at the same time.  Over to refer her to a medical oncologist preop, and she declined but does want to see one postop  Schedule PET/CT preop-on 10/04/2013.   Discussed in GI Tumor Board 10/02/2013.  Consensus is that If PET positive at posterior liver nodule, then refer for CT guided biopsy.  If negative, ignore.  Case presented to Dr. Nancy Marus, [facility].   She states that adnexal mass should be excised.   Discussed with patient and she will see DR. [name] at [facility] March 5 for preop evaluation.  We will coordinate surgery thereafter.   We will go ahead and get lab work now to make sure we know what her hemoglobin is  I told her she could resume a soft diet  and FeSO4.  I discussed the indications, details, techniques, and numerous risk of the surgery with the patient her daughter. There where the risk of bleeding, infection, wound healing problems such as hernia, conversion to open laparotomy, injury to adjacent organs such as the ureter or intestine with major reconstructive surgery, reoperation for complications such as anastomotic leak, and other on proceeding problems. She understands all these issues are well. At this time all of her questions are entered. She agrees with this plan.        Edsel Petrin. Dalbert Batman, M.D., [facility] General and Minimally invasive Surgery Breast and Colorectal Surgery Office:    [phone] Pager:   [phone]  09/27/2013, 3:20 PM

## 2013-09-28 LAB — URINALYSIS
Bilirubin Urine: NEGATIVE
Glucose, UA: NEGATIVE mg/dL
Hgb urine dipstick: NEGATIVE
Ketones, ur: 15 mg/dL — AB
Leukocytes, UA: NEGATIVE
Nitrite: NEGATIVE
PROTEIN: NEGATIVE mg/dL
Specific Gravity, Urine: 1.011 (ref 1.005–1.030)
UROBILINOGEN UA: 0.2 mg/dL (ref 0.0–1.0)
pH: 5.5 (ref 5.0–8.0)

## 2013-09-28 LAB — CEA: CEA: 1.8 ng/mL (ref 0.0–5.0)

## 2013-09-30 ENCOUNTER — Ambulatory Visit
Admission: RE | Admit: 2013-09-30 | Discharge: 2013-09-30 | Disposition: A | Discharge status: Home / Self Care | Payer: Medicare Other | Source: Ambulatory Visit | Attending: Family Medicine | Admitting: Family Medicine

## 2013-09-30 DIAGNOSIS — Z1231 Encounter for screening mammogram for malignant neoplasm of breast: Secondary | ICD-10-CM

## 2013-10-02 ENCOUNTER — Telehealth (INDEPENDENT_AMBULATORY_CARE_PROVIDER_SITE_OTHER): Payer: Self-pay | Admitting: General Surgery

## 2013-10-02 ENCOUNTER — Other Ambulatory Visit: Payer: Self-pay | Admitting: Family Medicine

## 2013-10-02 DIAGNOSIS — R928 Other abnormal and inconclusive findings on diagnostic imaging of breast: Secondary | ICD-10-CM

## 2013-10-02 NOTE — Telephone Encounter (Signed)
Per Dr. Dalbert Batman, called pt to ask if she has a GYN, as her pre-op CT showed a cyst on her ovary.  This could be addressed during the colon surgery.  Pt states her PCP, Dr. Stephanie Acre, sees her for any GYN concerns at this time.  Dr. Dalbert Batman later called to have our office contact pt with appt to be assessed ASAP by Dr. [name] to evaluate her for removal of that ovary at the same time as her surgery for colon Ca.  Also to let OR schedulers know to schedule her surgery at [facility].  Dr. [name] will see pt at next office day, October 17, 2013 at 10:00 am, arrive 9:30 am.  [facility] to call back for this appt information.

## 2013-10-02 NOTE — Telephone Encounter (Signed)
Pt made aware of her appointment with Dr. [name].

## 2013-10-04 ENCOUNTER — Ambulatory Visit (HOSPITAL_COMMUNITY)
Admission: RE | Admit: 2013-10-04 | Discharge: 2013-10-04 | Disposition: A | Discharge status: Home / Self Care | Payer: Medicare Other | Source: Ambulatory Visit | Attending: General Surgery | Admitting: General Surgery

## 2013-10-04 ENCOUNTER — Encounter (HOSPITAL_COMMUNITY): Payer: Self-pay

## 2013-10-04 DIAGNOSIS — C189 Malignant neoplasm of colon, unspecified: Secondary | ICD-10-CM | POA: Insufficient documentation

## 2013-10-04 DIAGNOSIS — K639 Disease of intestine, unspecified: Secondary | ICD-10-CM | POA: Insufficient documentation

## 2013-10-04 DIAGNOSIS — K7689 Other specified diseases of liver: Secondary | ICD-10-CM | POA: Insufficient documentation

## 2013-10-04 LAB — GLUCOSE, CAPILLARY: GLUCOSE-CAPILLARY: 92 mg/dL (ref 70–99)

## 2013-10-04 MED ORDER — FLUDEOXYGLUCOSE F - 18 (FDG) INJECTION
7.3000 | Freq: Once | INTRAVENOUS | Status: AC | PRN
  Administered 2013-10-04: 7.3 via INTRAVENOUS

## 2013-10-07 NOTE — Addendum Note (Signed)
Addended by: Ivor Costa on: 10/07/2013 05:32 PM   Modules accepted: Orders

## 2013-10-11 ENCOUNTER — Other Ambulatory Visit (HOSPITAL_COMMUNITY): Payer: Medicare Other

## 2013-10-14 ENCOUNTER — Encounter (HOSPITAL_COMMUNITY): Payer: Self-pay | Admitting: Pharmacy Technician

## 2013-10-14 ENCOUNTER — Telehealth (INDEPENDENT_AMBULATORY_CARE_PROVIDER_SITE_OTHER): Payer: Self-pay | Admitting: *Deleted

## 2013-10-14 NOTE — Telephone Encounter (Signed)
Called patient per Dr. Dalbert Batman to let her know that the PET scan shows no evidence of liver disease.  Patient states understanding at this time.

## 2013-10-16 NOTE — Progress Notes (Signed)
Chest ct 09-04-13 epic Echo 09-05-13 epic ekg 09-03-13 epic

## 2013-10-16 NOTE — Patient Instructions (Addendum)
[place]  10/16/2013   Your procedure is scheduled on: Tuesday March 10th  Report to [place] at 530 AM.  Call this number if you have problems the morning of surgery [phone]   Remember: follow any bowel prep instructions from dr Dalbert Batman, blood type will be drawn morning of surgery in shirt stay.  Do not eat food :After Midnight Sunday night, clear liquids all day Monday 10-21-2013, no clear liquids after midnight Monday night.     Take these medicines the morning of surgery with A SIP OF WATER:                                 [place]             You may not have any metal on your body including hair pins and piercings  Do not wear jewelry, make-up.  Do not wear lotions, powders, or perfumes. No  Deodorant is to be worn.   Men may shave face and neck.  Do not bring valuables to the hospital. [place].  Contacts, dentures or bridgework may not be worn into surgery.  Leave suitcase in the car. After surgery it may be brought to your room.  For patients admitted to the hospital, checkout time is 11:00 AM the day of discharge.   P Please read over the following fact sheets that you were given: [facility] Preparing for surgery sheet,  incentive spirometer sheet, blood fact sheet.     FAILURE TO FOLLOW THESE INSTRUCTIONS MAY RESULT IN THE CANCELLATION OF YOUR SURGERY.  PATIENT SIGNATURE___________________________________________  NURSE SIGNATURE_____________________________________________

## 2013-10-16 NOTE — Progress Notes (Signed)
Cbc wirh, cmet, cea and ua done and you reviewed results 09-28-2013 in epic, do you want these labs repeated with pre op 10-17-2013 0800 am,? thanks

## 2013-10-17 ENCOUNTER — Encounter (HOSPITAL_COMMUNITY)
Admission: RE | Admit: 2013-10-17 | Discharge: 2013-10-17 | Disposition: A | Discharge status: Home / Self Care | Payer: Medicare Other | Source: Ambulatory Visit | Attending: General Surgery | Admitting: General Surgery

## 2013-10-17 ENCOUNTER — Encounter (HOSPITAL_COMMUNITY): Payer: Self-pay

## 2013-10-17 ENCOUNTER — Ambulatory Visit: Payer: Medicare Other | Attending: Gynecologic Oncology | Admitting: Gynecologic Oncology

## 2013-10-17 ENCOUNTER — Encounter: Payer: Self-pay | Admitting: Gynecologic Oncology

## 2013-10-17 DIAGNOSIS — N83209 Unspecified ovarian cyst, unspecified side: Secondary | ICD-10-CM | POA: Insufficient documentation

## 2013-10-17 DIAGNOSIS — C180 Malignant neoplasm of cecum: Secondary | ICD-10-CM | POA: Insufficient documentation

## 2013-10-17 DIAGNOSIS — I10 Essential (primary) hypertension: Secondary | ICD-10-CM | POA: Insufficient documentation

## 2013-10-17 DIAGNOSIS — R011 Cardiac murmur, unspecified: Secondary | ICD-10-CM | POA: Insufficient documentation

## 2013-10-17 DIAGNOSIS — Z01812 Encounter for preprocedural laboratory examination: Secondary | ICD-10-CM | POA: Insufficient documentation

## 2013-10-17 DIAGNOSIS — Z79899 Other long term (current) drug therapy: Secondary | ICD-10-CM | POA: Insufficient documentation

## 2013-10-17 HISTORY — DX: Adverse effect of unspecified anesthetic, initial encounter: T41.45XA

## 2013-10-17 HISTORY — DX: Other complications of anesthesia, initial encounter: T88.59XA

## 2013-10-17 HISTORY — DX: Other specified diseases of liver: K76.89

## 2013-10-17 LAB — CBC WITH DIFFERENTIAL/PLATELET
BASOS ABS: 0 10*3/uL (ref 0.0–0.1)
Basophils Relative: 0 % (ref 0–1)
Eosinophils Absolute: 0.4 10*3/uL (ref 0.0–0.7)
Eosinophils Relative: 5 % (ref 0–5)
HCT: 36.2 % (ref 36.0–46.0)
Hemoglobin: 10.9 g/dL — ABNORMAL LOW (ref 12.0–15.0)
LYMPHS ABS: 1.2 10*3/uL (ref 0.7–4.0)
Lymphocytes Relative: 16 % (ref 12–46)
MCH: 23.5 pg — ABNORMAL LOW (ref 26.0–34.0)
MCHC: 30.1 g/dL (ref 30.0–36.0)
MCV: 78.2 fL (ref 78.0–100.0)
MONOS PCT: 6 % (ref 3–12)
Monocytes Absolute: 0.5 10*3/uL (ref 0.1–1.0)
Neutro Abs: 5.5 10*3/uL (ref 1.7–7.7)
Neutrophils Relative %: 73 % (ref 43–77)
PLATELETS: 349 10*3/uL (ref 150–400)
RBC: 4.63 MIL/uL (ref 3.87–5.11)
WBC: 7.6 10*3/uL (ref 4.0–10.5)

## 2013-10-17 LAB — COMPREHENSIVE METABOLIC PANEL
ALT: 8 U/L (ref 0–35)
AST: 19 U/L (ref 0–37)
Albumin: 3.4 g/dL — ABNORMAL LOW (ref 3.5–5.2)
Alkaline Phosphatase: 42 U/L (ref 39–117)
BUN: 11 mg/dL (ref 6–23)
CO2: 27 mEq/L (ref 19–32)
Calcium: 9.6 mg/dL (ref 8.4–10.5)
Chloride: 102 mEq/L (ref 96–112)
Creatinine, Ser: 0.81 mg/dL (ref 0.50–1.10)
GFR calc non Af Amer: 69 mL/min — ABNORMAL LOW (ref >90)
GFR, EST AFRICAN AMERICAN: 80 mL/min — AB (ref >90)
Glucose, Bld: 87 mg/dL (ref 70–99)
Potassium: 4.3 mEq/L (ref 3.7–5.3)
SODIUM: 141 meq/L (ref 137–147)
TOTAL PROTEIN: 6.9 g/dL (ref 6.0–8.3)
Total Bilirubin: 0.4 mg/dL (ref 0.3–1.2)

## 2013-10-17 LAB — URINALYSIS, ROUTINE W REFLEX MICROSCOPIC
Bilirubin Urine: NEGATIVE
Glucose, UA: NEGATIVE mg/dL
Hgb urine dipstick: NEGATIVE
Ketones, ur: NEGATIVE mg/dL
LEUKOCYTES UA: NEGATIVE
NITRITE: NEGATIVE
PH: 6.5 (ref 5.0–8.0)
Protein, ur: NEGATIVE mg/dL
Specific Gravity, Urine: 1.005 (ref 1.005–1.030)
Urobilinogen, UA: 0.2 mg/dL (ref 0.0–1.0)

## 2013-10-17 LAB — CEA: CEA: 1.7 ng/mL (ref 0.0–5.0)

## 2013-10-17 NOTE — Patient Instructions (Signed)
Plan for surgery on March 10 with Dr. [name] and Dr. Dalbert Batman.    Bilateral Salpingo-Oophorectomy Bilateral salpingo-oophorectomy is the surgical removal of both fallopian tubes and ovaries. The ovaries are small organs that produce eggs in women. The fallopian tubes transport the egg from the ovary to the womb (uterus). A bilateral salpingo-oophorectomy may be done for various reasons, including:  Infection in the fallopian tube and ovary.  Scar tissue in the fallopian tube and ovary (adhesions).  A cyst or tumor on the ovary.  A need to remove the fallopian tube and ovary when removing the uterus.  Cancer of the fallopian tube or ovary. LET [facility] CARE PROVIDER KNOW ABOUT:  Any allergies you have.  All medicines you are taking, including vitamins, herbs, eye drops, creams, and over-the-counter medicines.  Previous problems you or members of your family have had with the use of anesthetics.  Any blood disorders you have.  Previous surgeries you have had.  Medical conditions you have. RISKS AND COMPLICATIONS  Generally, this is a safe procedure. However, as with any procedure, complications can occur. Possible complications include:  Injury to surrounding organs.  Bleeding.  Infection.  Blood clots in the legs or lungs.  Problems related to anesthesia. BEFORE THE PROCEDURE  Ask your health care provider about changing or stopping your regular medicines. You may need to stop taking certain medicines, such as aspirin or blood thinners, at least 1 week before the surgery.  Do not eat or drink anything for at least 8 hours before the surgery.  If you smoke, do not smoke for at least 2 weeks before the surgery.  Make plans to have someone drive you home after the procedure or after your hospital stay. Also arrange for someone to help you with activities during recovery. PROCEDURE  You will be given medicine to help you relax before the procedure (sedative). You will  then be given medicine to make you sleep through the procedure (general anesthetic). These medicines will be given through an IV access tube that is put into one of your veins.  Once you are asleep, your lower abdomen will be shaved and cleaned. A thin, flexible tube (catheter) will be placed in your bladder.  The surgeon may use a laparoscopic, robotic, or open technique for this surgery:  In the laparoscopic technique, the surgery is done through two small cuts (incisions) in the abdomen. A thin, lighted tube with a tiny camera on the end (laparoscope) is inserted into one of the incisions. The tools needed for the procedure are put through the other incision.  A robotic technique may be chosen to perform complex surgery in a small space. In the robotic technique, small incisions are made. A camera and surgical instruments are passed through the incisions. Surgical instruments are controlled with the help of a robotic arm.  In the open technique, the surgery is done through one large incision in the abdomen.  Using any of these techniques, the surgeon will remove the fallopian tube and ovary. The blood vessels will be clamped and tied.  The surgeon will then use staples or stitches to close the incision or incisions. AFTER THE PROCEDURE  You will be taken to a recovery area where your progress will be monitored for 1 3 hours. Your blood pressure, pulse, and temperature will be checked often. You will remain in the recovery area until you are stable and waking up.  If the laparoscopic technique was used, you may be allowed to go  home after several hours. You may have some shoulder pain. This is normal and usually goes away in a day or two.  If the open technique was used, you will be admitted to the hospital for a couple of days.  You will be given pain medicine as necessary.  The IV tube and catheter will be removed before you are discharged. Document Released: 05/29/2009 Document  Revised: 05/22/2013 Document Reviewed: 01/23/2013 [facility] Patient Information 2014 [employer].

## 2013-10-17 NOTE — Progress Notes (Signed)
Consult Note: Gyn-Onc  Cynthia [name] 78 y.o. female  CC:  Chief Complaint  Patient presents with  . Ovarian Cyst    New patient    HPI: Cynthia [name] is a 78 y.o. G4P4 who was referred to me today by Dr. Fanny Skates for a large right adnexal mass. She was referred to Dr. Dalbert Batman by  Dr. Wilford Corner for evaluation and management of symptomatic adenocarcinoma of the cecum. Her primary care physician Dr. Jonathon Jordan.   This patient has enjoyed good health. She doesn't see doctors much but does get an annual physical. She's never had a colonoscopy. She had a near syncopal episode at church went to the ED and found to be anemic. She was transfused one unit of blood. She was placed on iron, which turned her stools black.  On January 22 her hemoglobin was 7.8.   A CT scan was performed on September 20, 2013 shows a 5 cm cecal mass extending into the terminal ileum compatible with a carcinoma. There were suspected extension and the pericolonic fat with adjacent small lymph nodes age and multiple benign-appearing liver cyst. There was a 11 x 16 mm soft tissue implant overlying the posterior right hepatic lobe possibly reflecting a peritoneal nodule.There is a large right adnexal cyst. Status post hysterectomy. 7.0 x 5.8 cm right ovarian cyst  without malignant features on CT.  Dr. Michail Sermon performed a colonoscopy shortly thereafter and found a large fungating ulcerated partially obstructing mass in the cecum which was circumferential. There was no blood. He was able to visualize the ileocecal valve. Biopsy shows adenocarcinoma.  PET/CT 10/04/13 CHEST  No hypermetabolic mediastinal or hilar nodes. No suspicious pulmonary nodules on the CT scan. A nodular density adjacent to the major fissure on the left on image number 66 is likely an intrapulmonary lymph node.  ABDOMEN/PELVIS  Large right cecal mass as demonstrated on the prior CT scan demonstrating marked FDG uptake with SUV max of  30.0. I do not see any definite hypermetabolic pericecal lymph nodes. No hypermetabolic liver lesions to suggest liver metastasis. There are multiple benign appearing hepatic cysts. No retroperitoneal adenopathy. The 10 mm lymph node anterior to the right psoas muscle does not show any FDG uptake. There is also a small cluster of adjacent nodes which are not definitely a metabolically active. An 8 mm lymph node in the right lower pelvis on image number 159 does not show any hypermetabolism.  As demonstrated on the CT scan there is a large cyst associated with the right ovary. Hypermetabolism adjacent to this area laterally is probably the right ureter. Small irregular density in the right lower pelvis on image number 170 measuring approximately 12 mm does show hypermetabolism (SUV max 6.0) but would be an unusual drainage pattern for cecal cancer. Attention to this area on future studies is suggested.  SKELETON  No focal hypermetabolic activity to suggest skeletal metastasis.  IMPRESSION:  1. Large right cecal mass consistent with neoplasm with SUV max of 30.0  2. No findings to suggest hepatic metastatic disease.  3. Pericecal lymph nodes do not demonstrate any definite FDG uptake but are still worrisome for metastatic disease. A hypermetabolic lesion in the right lower pelvis needs close attention.  She is scheduled for surgery with Dr. Dalbert Batman on March 10. We will perform a concomitant bilateral salpingo-oophorectomy at that time.  Review of Systems  Constitutional: Denies fever. Skin: No rash, sores, jaundice, itching, or dryness.  Cardiovascular: No chest pain, shortness of breath, or  edema  Pulmonary: No cough or wheeze.  Gastro Intestinal:  No nausea, vomiting, early satiety Genitourinary: No frequency, urgency, or dysuria.  Denies vaginal bleeding and discharge.  Musculoskeletal: No myalgia, arthralgia, joint swelling or pain.  Neurologic: No weakness, numbness, or change in gait.      Current Meds:  Outpatient Encounter Prescriptions as of 10/17/2013  Medication Sig  . ferrous sulfate 325 (65 FE) MG tablet Take 325 mg by mouth 2 (two) times daily.  . ciprofloxacin (CIPRO) 500 MG tablet Take 500 mg by mouth 2 (two) times daily. Day before surgery 10-21-2013  . metroNIDAZOLE (FLAGYL) 500 MG tablet Take 500 mg by mouth 3 (three) times daily. Day before surgery 10-21-2013  . polyethylene glycol (GOLYTELY/NULYTELY) 236 G solution Take 4,000 mLs by mouth once.    Allergy: No Known Allergies  Social Hx:   History   Social History  . Marital Status: Widowed    Spouse Name: N/A    Number of Children: N/A  . Years of Education: N/A   Occupational History  . Not on file.   Social History Main Topics  . Smoking status: Never Smoker   . Smokeless tobacco: Never Used  . Alcohol Use: No  . Drug Use: No  . Sexual Activity: Not on file   Other Topics Concern  . Not on file   Social History Narrative  . No narrative on file    Past Surgical Hx: Vaginal hysterectomy 40 years ago for cervical dysplasia Past Surgical History  Procedure Laterality Date  . Abdominal hysterectomy      vaginal- partial  . Back surgery  1981    lower lumbar  . Tubal ligation  1968    Past Medical Hx:  Past Medical History  Diagnosis Date  . Cancer     cervical ca  . Anemia   . Blood transfusion without reported diagnosis jan 2015  . Colon cancer   . Heart murmur   . Hypertension     no bp meds   . Liver cyst   . Complication of anesthesia     slow to awaken in past    Oncology Hx:   No history exists.    Family Hx:  Family History  Problem Relation Age of Onset  . Diabetes Sister   . Cancer Sister     leukemia  . Diabetes Brother   . Heart failure Brother   . Hypertension Brother   . Breast cancer Maternal Aunt     Vitals:  There were no vitals taken for this visit. weight 133 pounds, height 5 foot 1, temperature 38.7, blood pressure 148/83, pulse 92,  respirations 16  Physical Exam: Well-nourished well-developed female in no acute distress.  Neck: Supple, no lymphadenopathy no thyromegaly.  Lungs: Clear to auscultation bilaterally.  Cardiovascular: Regular rate rhythm.   Abdomen: Well-healed vertical infraumbilical midline incision. Soft, nontender, nondistended.  Groins: No lymphadenopathy.  Extremities: No edema.  Pelvic: Normal external female genitalia. Bimanual examination reveals no masses or nodularity. Rectal was deferred.  Assessment/Plan: 77-year-old with seek colon cancer who appears to have disease limited in that area however, was found have an incidental right ovarian cyst. She is scheduled for a laparoscopic-assisted right colectomy possible open colectomy on March 10. We will plan to proceed with a bilateral salpingo-oophorectomy at that time. I discussed with the patient and her daughter that we use whatever incisions Dr. Ingram has used for his surgery. We will send a right ovarian mass for frozen   section. However, I do not believe it represents malignancy as it has benign features both on CT scan and PET scan.  Her questions were elicited in answer to her satisfaction. She has been counseled regarding perioperative risk.  Hally Colella A., MD 10/17/2013, 10:12 AM

## 2013-10-17 NOTE — H&P (Signed)
Cynthia [name]   MRN:  [id number]   Description: 78 year old female  Provider: [name], MD  Department: [facility]              Diagnoses      Malignant neoplasm of ascending colon    -  Primary      153.6      Symptomatic anemia          285.9            Current Vitals - Last Recorded      BP Pulse Temp(Src) Resp Ht Wt      172/100 88 97.8 F (36.6 C) (Oral) 16 5' 1.75" (1.568 m) 133 lb (60.328 kg)      BMI  24.54 kg/m2                   History and Physical     [name], MD   Status: Addendum            Patient ID: Cynthia [name], female   DOB: [birth year], 78 y.o.   MRN: [id number]               HPI Cynthia [name] is a 78 y.o. female.  She is referred by Dr. Wilford Corner for evaluation and management of symptomatic adenocarcinoma of the cecum. Her primary care physician Dr. Silvana Newness.   This patient has enjoyed good health. She doesn't see doctors much but does get an annual physical. She's never had a colonoscopy. She had a near syncopal episode at church recently, last month, went to the ED and found to be anemic. She was transfused one unit of blood. She was placed on iron, which turned her stools black. . She says she has felt fatigued but denies any change in appetite. Denies seeing blood in her stools or dark stools. Denies abdominal pain nausea or vomiting. Minimal change in weight.   On January 22 her hemoglobin was 7.8. She says it is up to 9.1     I cannot find that report. Basic metabolic panel normal. No other labs.   A CT scan was performed on February 6 shows a 5 cm cecal mass extending into the terminal ileum compatible with a carcinoma. There were suspected extension and the pericolonic fat with adjacent small lymph nodes age and multiple benign-appearing liver cyst. There was a 11 x 16 mm soft tissue implant overlying the posterior right hepatic lobe possibly reflecting a peritoneal nodule.There is a  large right adnexal cyst. There was no evidence of obstruction by CT. A PET/CT was recommended   Dr. Michail Sermon performed a colonoscopy shortly thereafter and found a large fungating ulcerated partially obstructing mass in the cecum which was circumferential. There was no blood. He was able to visualize the ileocecal valve. Biopsy shows adenocarcinoma.   She is asymptomatic today she is here with her daughter.    Comorbidities include a vaginal hysterectomy in her 66s for cervical cancer. Hypertension. Anemia. Hyperlipidemia. Mild mitral regurgitation.        Past Medical History   Diagnosis  Date   .  Hypertension     .  Cancer         cervical ca   .  Anemia     .  Blood transfusion without reported diagnosis     .  Colon cancer     .  Heart murmur  Past Surgical History   Procedure  Laterality  Date   .  Back surgery       .  Abdominal hysterectomy           vaginal- partial         Family History   Problem  Relation  Age of Onset   .  Diabetes  Sister     .  Cancer  Sister         leukemia   .  Diabetes  Brother     .  Heart failure  Brother     .  Hypertension  Brother          Social History History   Substance Use Topics   .  Smoking status:  Never Smoker    .  Smokeless tobacco:  Never Used   .  Alcohol Use:  No       No Known Allergies    No current outpatient prescriptions on file.             Review of Systems   Constitutional: Negative for fever, chills and unexpected weight change.  HENT: Negative for congestion, hearing loss, sore throat, trouble swallowing and voice change.   Eyes: Negative for visual disturbance.  Respiratory: Negative for cough and wheezing.   Cardiovascular: Negative for chest pain, palpitations and leg swelling.  Gastrointestinal: Negative for nausea, vomiting, abdominal pain, diarrhea, constipation, blood in stool, abdominal distention and anal bleeding.  Genitourinary: Negative for hematuria,  vaginal bleeding and difficulty urinating.  Musculoskeletal: Negative for arthralgias.  Skin: Negative for rash and wound.  Neurological: Positive for syncope. Negative for seizures and headaches.  Hematological: Negative for adenopathy. Does not bruise/bleed easily.  Psychiatric/Behavioral: Negative for confusion.      Blood pressure 172/100, pulse 88, temperature 97.8 F (36.6 C), temperature source Oral, resp. rate 16, height 5' 1.75" (1.568 m), weight 133 lb (60.328 kg).   Physical Exam  Constitutional: She is oriented to person, place, and time. She appears well-developed and well-nourished. No distress.  She looks quite good. Alert and healthy. Mild pallor but not bad. Alert and oriented. Daughter is with her throughout the counter.  HENT:   Head: Normocephalic and atraumatic.   Nose: Nose normal.   Mouth/Throat: No oropharyngeal exudate.  Eyes: Conjunctivae and EOM are normal. Pupils are equal, round, and reactive to light. Left eye exhibits no discharge. No scleral icterus.  Neck: Neck supple. No JVD present. No tracheal deviation present. No thyromegaly present.  Cardiovascular: Normal rate, regular rhythm, normal heart sounds and intact distal pulses.    No murmur heard. Pulmonary/Chest: Effort normal and breath sounds normal. No respiratory distress. She has no wheezes. She has no rales. She exhibits no tenderness.  Abdominal: Soft. Bowel sounds are normal. She exhibits no distension and no mass. There is no tenderness. There is no rebound and no guarding.  Question stretch mark versus short vertical incision below umbilicus. Patient not sure whether she have surgery or not.abdomen is soft and nontender. Not distended. No mass. No inguinal adenopathy.  Musculoskeletal: She exhibits no edema and no tenderness.  Lymphadenopathy:    She has no cervical adenopathy.  Neurological: She is alert and oriented to person, place, and time. She exhibits normal muscle tone. Coordination  normal.  Skin: Skin is warm. No rash noted. She is not diaphoretic. No erythema. No pallor.  Psychiatric: She has a normal mood and affect. Her behavior is normal. Judgment and thought content normal.  Data Reviewed Referring physician office note. Colonoscopy report. CT scan. Pathology report   Assessment     locally advanced adenocarcinoma of the cecum.There clinically this is circumferential and there is concern that this will obstruct in the future, but she is not clinically obstructed at this point in time.   Question liver or peritoneal nodule at lower edge of the posterior right lobe of liver   Right adnexal cyst. Consider excision at time of colectomy.   Status post vaginal hysterectomy remote, cervical cancer   Significant anemia, symptomatic resulting in near syncope requiring transfusion.   Hypertension   Mild mitral regurgitation      Plan    I had a long discussion with the patient and her daughter. She was advised to undergo elective laparoscopic-assisted right colectomy, possible open colectomy in the near future. I told her that we would try to assess her liver at the same time.   Over to refer her to a medical oncologist preop, and she declined but does want to see one postop   Schedule PET/CT preop-on 10/04/2013.   Discussed in GI Tumor Board 10/02/2013.  Consensus is that If PET positive at posterior liver nodule, then refer for CT guided biopsy.  If negative, ignore.   Case presented to Dr. Nancy Marus, [facility].   She states that adnexal mass should be excised.   Discussed with patient and she will see DR. [name] at [facility] March 5 for preop evaluation.  We will coordinate surgery thereafter.    We will go ahead and get lab work now to make sure we know what her hemoglobin is   I told her she could resume a soft diet and FeSO4.   I discussed the indications, details, techniques, and numerous risk of the surgery with the patient her  daughter. There where the risk of bleeding, infection, wound healing problems such as hernia, conversion to open laparotomy, injury to adjacent organs such as the ureter or intestine with major reconstructive surgery, reoperation for complications such as anastomotic leak, and other on proceeding problems. She understands all these issues are well. At this time all of her questions are entered. She agrees with this plan.           Edsel Petrin. Dalbert Batman, M.D., [facility] General and Minimally invasive Surgery Breast and Colorectal Surgery Office:   [phone] Pager:   [phone]

## 2013-10-18 ENCOUNTER — Telehealth: Payer: Self-pay | Admitting: *Deleted

## 2013-10-18 NOTE — Telephone Encounter (Signed)
Spoke with patient and confirmed appointment with Dr. Benay Spice for 11/11/13.  Contact names, numbers, and directions were given.  Patient had no questions and appreciated the call.

## 2013-10-21 ENCOUNTER — Inpatient Hospital Stay (HOSPITAL_COMMUNITY): Admission: RE | Admit: 2013-10-21 | Payer: Medicare Other | Source: Ambulatory Visit | Admitting: General Surgery

## 2013-10-21 ENCOUNTER — Telehealth: Payer: Self-pay | Admitting: Gynecologic Oncology

## 2013-10-21 ENCOUNTER — Encounter (HOSPITAL_COMMUNITY): Admission: RE | Payer: Self-pay | Source: Ambulatory Visit

## 2013-10-21 SURGERY — COLECTOMY, RIGHT, LAPAROSCOPIC
Anesthesia: General

## 2013-10-21 NOTE — Telephone Encounter (Signed)
Telephone call to check on pre-operative status.  Patient complaint with pre-operative instructions.  Reinforced NPO after midnight.  No questions or concerns voiced.  Instructed to call for any needs. 

## 2013-10-22 ENCOUNTER — Inpatient Hospital Stay (HOSPITAL_COMMUNITY)
Admission: RE | Admit: 2013-10-22 | Discharge: 2013-10-27 | DRG: 330 | Disposition: A | Discharge status: Home / Self Care | Payer: Medicare Other | Source: Ambulatory Visit | Attending: General Surgery | Admitting: General Surgery

## 2013-10-22 ENCOUNTER — Encounter (HOSPITAL_COMMUNITY): Payer: Self-pay | Admitting: *Deleted

## 2013-10-22 ENCOUNTER — Encounter (HOSPITAL_COMMUNITY)
Admission: RE | Disposition: A | Discharge status: Home / Self Care | Payer: Self-pay | Source: Ambulatory Visit | Attending: General Surgery

## 2013-10-22 ENCOUNTER — Encounter (HOSPITAL_COMMUNITY): Payer: Medicare Other | Admitting: Certified Registered Nurse Anesthetist

## 2013-10-22 ENCOUNTER — Inpatient Hospital Stay (HOSPITAL_COMMUNITY): Payer: Medicare Other | Admitting: Certified Registered Nurse Anesthetist

## 2013-10-22 DIAGNOSIS — R011 Cardiac murmur, unspecified: Secondary | ICD-10-CM | POA: Diagnosis present

## 2013-10-22 DIAGNOSIS — C180 Malignant neoplasm of cecum: Principal | ICD-10-CM | POA: Diagnosis present

## 2013-10-22 DIAGNOSIS — I059 Rheumatic mitral valve disease, unspecified: Secondary | ICD-10-CM | POA: Diagnosis present

## 2013-10-22 DIAGNOSIS — Z833 Family history of diabetes mellitus: Secondary | ICD-10-CM

## 2013-10-22 DIAGNOSIS — I824Z9 Acute embolism and thrombosis of unspecified deep veins of unspecified distal lower extremity: Secondary | ICD-10-CM

## 2013-10-22 DIAGNOSIS — E785 Hyperlipidemia, unspecified: Secondary | ICD-10-CM | POA: Diagnosis present

## 2013-10-22 DIAGNOSIS — D649 Anemia, unspecified: Secondary | ICD-10-CM | POA: Diagnosis present

## 2013-10-22 DIAGNOSIS — C189 Malignant neoplasm of colon, unspecified: Secondary | ICD-10-CM

## 2013-10-22 DIAGNOSIS — I10 Essential (primary) hypertension: Secondary | ICD-10-CM | POA: Diagnosis present

## 2013-10-22 DIAGNOSIS — Z8249 Family history of ischemic heart disease and other diseases of the circulatory system: Secondary | ICD-10-CM

## 2013-10-22 DIAGNOSIS — N83209 Unspecified ovarian cyst, unspecified side: Secondary | ICD-10-CM | POA: Diagnosis present

## 2013-10-22 DIAGNOSIS — Z806 Family history of leukemia: Secondary | ICD-10-CM

## 2013-10-22 DIAGNOSIS — C182 Malignant neoplasm of ascending colon: Secondary | ICD-10-CM | POA: Diagnosis present

## 2013-10-22 DIAGNOSIS — Z8541 Personal history of malignant neoplasm of cervix uteri: Secondary | ICD-10-CM

## 2013-10-22 HISTORY — PX: SALPINGOOPHORECTOMY: SHX82

## 2013-10-22 HISTORY — PX: LAPAROSCOPIC RIGHT COLECTOMY: SHX5925

## 2013-10-22 LAB — CBC
HCT: 31 % — ABNORMAL LOW (ref 36.0–46.0)
Hemoglobin: 9.7 g/dL — ABNORMAL LOW (ref 12.0–15.0)
MCH: 24.2 pg — ABNORMAL LOW (ref 26.0–34.0)
MCHC: 31.3 g/dL (ref 30.0–36.0)
MCV: 77.3 fL — ABNORMAL LOW (ref 78.0–100.0)
PLATELETS: 252 10*3/uL (ref 150–400)
RBC: 4.01 MIL/uL (ref 3.87–5.11)
WBC: 10.5 10*3/uL (ref 4.0–10.5)

## 2013-10-22 LAB — TYPE AND SCREEN
ABO/RH(D): A POS
ANTIBODY SCREEN: NEGATIVE

## 2013-10-22 SURGERY — COLECTOMY, RIGHT, LAPAROSCOPIC
Anesthesia: General | Laterality: Right

## 2013-10-22 MED ORDER — PROPOFOL 10 MG/ML IV BOLUS
INTRAVENOUS | Status: DC | PRN
  Administered 2013-10-22: 110 mg via INTRAVENOUS
  Administered 2013-10-22: 20 mg via INTRAVENOUS

## 2013-10-22 MED ORDER — EPHEDRINE SULFATE 50 MG/ML IJ SOLN
INTRAMUSCULAR | Status: AC
  Filled 2013-10-22: qty 1

## 2013-10-22 MED ORDER — EPHEDRINE SULFATE 50 MG/ML IJ SOLN
INTRAMUSCULAR | Status: DC | PRN
  Administered 2013-10-22: 5 mg via INTRAVENOUS
  Administered 2013-10-22: 10 mg via INTRAVENOUS
  Administered 2013-10-22: 5 mg via INTRAVENOUS
  Administered 2013-10-22 (×2): 10 mg via INTRAVENOUS

## 2013-10-22 MED ORDER — ALVIMOPAN 12 MG PO CAPS
12.0000 mg | ORAL_CAPSULE | Freq: Once | ORAL | Status: AC
  Administered 2013-10-22: 12 mg via ORAL
  Filled 2013-10-22: qty 1

## 2013-10-22 MED ORDER — HYDROMORPHONE HCL PF 1 MG/ML IJ SOLN
INTRAMUSCULAR | Status: AC
  Filled 2013-10-22: qty 1

## 2013-10-22 MED ORDER — FENTANYL CITRATE 0.05 MG/ML IJ SOLN
INTRAMUSCULAR | Status: DC | PRN
  Administered 2013-10-22: 100 ug via INTRAVENOUS
  Administered 2013-10-22 (×3): 50 ug via INTRAVENOUS

## 2013-10-22 MED ORDER — DEXAMETHASONE SODIUM PHOSPHATE 10 MG/ML IJ SOLN
INTRAMUSCULAR | Status: AC
  Filled 2013-10-22: qty 1

## 2013-10-22 MED ORDER — ALUM & MAG HYDROXIDE-SIMETH 200-200-20 MG/5ML PO SUSP
30.0000 mL | Freq: Four times a day (QID) | ORAL | Status: DC | PRN
  Administered 2013-10-23: 30 mL via ORAL
  Filled 2013-10-22 (×2): qty 30

## 2013-10-22 MED ORDER — BUPIVACAINE-EPINEPHRINE PF 0.5-1:200000 % IJ SOLN
INTRAMUSCULAR | Status: AC
  Filled 2013-10-22: qty 30

## 2013-10-22 MED ORDER — PROMETHAZINE HCL 25 MG/ML IJ SOLN: 6.2500 mg | INTRAMUSCULAR | Status: DC | PRN

## 2013-10-22 MED ORDER — NEOSTIGMINE METHYLSULFATE 1 MG/ML IJ SOLN
INTRAMUSCULAR | Status: AC
  Filled 2013-10-22: qty 10

## 2013-10-22 MED ORDER — POTASSIUM CHLORIDE IN NACL 20-0.9 MEQ/L-% IV SOLN
INTRAVENOUS | Status: AC
  Filled 2013-10-22: qty 1000

## 2013-10-22 MED ORDER — SODIUM CHLORIDE 0.9 % IJ SOLN
INTRAMUSCULAR | Status: AC
  Filled 2013-10-22: qty 10

## 2013-10-22 MED ORDER — SUCCINYLCHOLINE CHLORIDE 20 MG/ML IJ SOLN
INTRAMUSCULAR | Status: DC | PRN
  Administered 2013-10-22: 100 mg via INTRAVENOUS

## 2013-10-22 MED ORDER — ROCURONIUM BROMIDE 100 MG/10ML IV SOLN
INTRAVENOUS | Status: AC
  Filled 2013-10-22: qty 1

## 2013-10-22 MED ORDER — LACTATED RINGERS IV SOLN: INTRAVENOUS | Status: DC

## 2013-10-22 MED ORDER — MIDAZOLAM HCL 2 MG/2ML IJ SOLN
INTRAMUSCULAR | Status: AC
  Filled 2013-10-22: qty 2

## 2013-10-22 MED ORDER — DEXAMETHASONE SODIUM PHOSPHATE 10 MG/ML IJ SOLN
INTRAMUSCULAR | Status: DC | PRN
  Administered 2013-10-22: 10 mg via INTRAVENOUS

## 2013-10-22 MED ORDER — ONDANSETRON HCL 4 MG/2ML IJ SOLN
4.0000 mg | Freq: Four times a day (QID) | INTRAMUSCULAR | Status: DC | PRN
  Administered 2013-10-22: 4 mg via INTRAVENOUS
  Filled 2013-10-22: qty 2

## 2013-10-22 MED ORDER — HYDROMORPHONE HCL PF 1 MG/ML IJ SOLN
0.2500 mg | INTRAMUSCULAR | Status: DC | PRN
Start: 2013-10-22 — End: 2013-10-22
  Administered 2013-10-22 (×4): 0.5 mg via INTRAVENOUS

## 2013-10-22 MED ORDER — ROCURONIUM BROMIDE 100 MG/10ML IV SOLN
INTRAVENOUS | Status: DC | PRN
  Administered 2013-10-22: 40 mg via INTRAVENOUS
  Administered 2013-10-22: 5 mg via INTRAVENOUS

## 2013-10-22 MED ORDER — ONDANSETRON HCL 4 MG/2ML IJ SOLN
INTRAMUSCULAR | Status: AC
  Filled 2013-10-22: qty 2

## 2013-10-22 MED ORDER — ATROPINE SULFATE 0.4 MG/ML IJ SOLN
INTRAMUSCULAR | Status: AC
  Filled 2013-10-22: qty 1

## 2013-10-22 MED ORDER — PROPOFOL 10 MG/ML IV BOLUS
INTRAVENOUS | Status: AC
  Filled 2013-10-22: qty 20

## 2013-10-22 MED ORDER — DEXTROSE 5 % IV SOLN
2.0000 g | Freq: Two times a day (BID) | INTRAVENOUS | Status: AC
  Administered 2013-10-22: 2 g via INTRAVENOUS
  Filled 2013-10-22: qty 2

## 2013-10-22 MED ORDER — GLYCOPYRROLATE 0.2 MG/ML IJ SOLN
INTRAMUSCULAR | Status: DC | PRN
  Administered 2013-10-22: 0.4 mg via INTRAVENOUS

## 2013-10-22 MED ORDER — ENOXAPARIN SODIUM 40 MG/0.4ML ~~LOC~~ SOLN
40.0000 mg | SUBCUTANEOUS | Status: DC
Start: 2013-10-23 — End: 2013-10-24
  Administered 2013-10-23 – 2013-10-24 (×2): 40 mg via SUBCUTANEOUS
  Filled 2013-10-22 (×4): qty 0.4

## 2013-10-22 MED ORDER — HYDROMORPHONE HCL PF 1 MG/ML IJ SOLN
0.5000 mg | INTRAMUSCULAR | Status: DC | PRN
  Administered 2013-10-22 – 2013-10-24 (×3): 1 mg via INTRAVENOUS
  Filled 2013-10-22 (×3): qty 1

## 2013-10-22 MED ORDER — ONDANSETRON HCL 4 MG PO TABS
4.0000 mg | ORAL_TABLET | Freq: Four times a day (QID) | ORAL | Status: DC | PRN
  Filled 2013-10-22: qty 1

## 2013-10-22 MED ORDER — PNEUMOCOCCAL VAC POLYVALENT 25 MCG/0.5ML IJ INJ
0.5000 mL | INJECTION | INTRAMUSCULAR | Status: DC
  Filled 2013-10-22 (×2): qty 0.5

## 2013-10-22 MED ORDER — GLYCOPYRROLATE 0.2 MG/ML IJ SOLN
INTRAMUSCULAR | Status: AC
  Filled 2013-10-22: qty 3

## 2013-10-22 MED ORDER — 0.9 % SODIUM CHLORIDE (POUR BTL) OPTIME
TOPICAL | Status: DC | PRN
  Administered 2013-10-22: 2000 mL

## 2013-10-22 MED ORDER — POTASSIUM CHLORIDE IN NACL 20-0.9 MEQ/L-% IV SOLN
INTRAVENOUS | Status: DC
  Administered 2013-10-22 – 2013-10-25 (×7): via INTRAVENOUS
  Filled 2013-10-22 (×8): qty 1000

## 2013-10-22 MED ORDER — OXYCODONE-ACETAMINOPHEN 5-325 MG PO TABS
1.0000 | ORAL_TABLET | ORAL | Status: DC | PRN
  Administered 2013-10-24 – 2013-10-26 (×6): 2 via ORAL
  Filled 2013-10-22 (×6): qty 2

## 2013-10-22 MED ORDER — PROMETHAZINE HCL 25 MG/ML IJ SOLN
12.5000 mg | Freq: Four times a day (QID) | INTRAMUSCULAR | Status: DC | PRN
  Administered 2013-10-22: 12.5 mg via INTRAVENOUS
  Filled 2013-10-22: qty 1

## 2013-10-22 MED ORDER — FENTANYL CITRATE 0.05 MG/ML IJ SOLN
INTRAMUSCULAR | Status: AC
  Filled 2013-10-22: qty 5

## 2013-10-22 MED ORDER — DEXTROSE 5 % IV SOLN
2.0000 g | INTRAVENOUS | Status: AC
  Administered 2013-10-22: 2 g via INTRAVENOUS
  Filled 2013-10-22: qty 2

## 2013-10-22 MED ORDER — NEOSTIGMINE METHYLSULFATE 1 MG/ML IJ SOLN
INTRAMUSCULAR | Status: DC | PRN
  Administered 2013-10-22: 3 mg via INTRAVENOUS

## 2013-10-22 MED ORDER — LIDOCAINE HCL (CARDIAC) 20 MG/ML IV SOLN
INTRAVENOUS | Status: AC
  Filled 2013-10-22: qty 5

## 2013-10-22 MED ORDER — BUPIVACAINE-EPINEPHRINE 0.5% -1:200000 IJ SOLN
INTRAMUSCULAR | Status: DC | PRN
  Administered 2013-10-22: 28 mL

## 2013-10-22 MED ORDER — LACTATED RINGERS IV SOLN
INTRAVENOUS | Status: DC | PRN
  Administered 2013-10-22 (×3): via INTRAVENOUS

## 2013-10-22 MED ORDER — LIDOCAINE HCL (CARDIAC) 20 MG/ML IV SOLN
INTRAVENOUS | Status: DC | PRN
  Administered 2013-10-22: 60 mg via INTRAVENOUS

## 2013-10-22 MED ORDER — PHENYLEPHRINE 40 MCG/ML (10ML) SYRINGE FOR IV PUSH (FOR BLOOD PRESSURE SUPPORT)
PREFILLED_SYRINGE | INTRAVENOUS | Status: AC
  Filled 2013-10-22: qty 10

## 2013-10-22 MED ORDER — SUCCINYLCHOLINE CHLORIDE 20 MG/ML IJ SOLN
INTRAMUSCULAR | Status: AC
  Filled 2013-10-22: qty 1

## 2013-10-22 MED ORDER — ONDANSETRON HCL 4 MG/2ML IJ SOLN
INTRAMUSCULAR | Status: DC | PRN
  Administered 2013-10-22: 4 mg via INTRAVENOUS

## 2013-10-22 MED ORDER — PHENYLEPHRINE HCL 10 MG/ML IJ SOLN
INTRAMUSCULAR | Status: DC | PRN
  Administered 2013-10-22: 80 ug via INTRAVENOUS

## 2013-10-22 MED ORDER — ALVIMOPAN 12 MG PO CAPS
12.0000 mg | ORAL_CAPSULE | Freq: Two times a day (BID) | ORAL | Status: DC
  Administered 2013-10-23 (×2): 12 mg via ORAL
  Filled 2013-10-22 (×4): qty 1

## 2013-10-22 MED ORDER — CEFOTETAN DISODIUM-DEXTROSE 2-2.08 GM-% IV SOLR
INTRAVENOUS | Status: AC
  Filled 2013-10-22: qty 50

## 2013-10-22 SURGICAL SUPPLY — 100 items
APPLIER CLIP 5 13 M/L LIGAMAX5 (MISCELLANEOUS)
APPLIER CLIP ROT 10 11.4 M/L (STAPLE)
BAG URINE DRAINAGE (UROLOGICAL SUPPLIES) IMPLANT
BENZOIN TINCTURE PRP APPL 2/3 (GAUZE/BANDAGES/DRESSINGS) IMPLANT
BLADE EXTENDED COATED 6.5IN (ELECTRODE) ×5 IMPLANT
BLADE HEX COATED 2.75 (ELECTRODE) ×5 IMPLANT
BLADE SURG 15 STRL LF DISP TIS (BLADE) IMPLANT
BLADE SURG 15 STRL SS (BLADE)
BLADE SURG SZ10 CARB STEEL (BLADE) IMPLANT
CABLE HIGH FREQUENCY MONO STRZ (ELECTRODE) ×5 IMPLANT
CANISTER SUCTION 2500CC (MISCELLANEOUS) IMPLANT
CATH FOLEY 2WAY SLVR  5CC 16FR (CATHETERS)
CATH FOLEY 2WAY SLVR 5CC 16FR (CATHETERS) IMPLANT
CATH ROBINSON RED A/P 16FR (CATHETERS) IMPLANT
CELLS DAT CNTRL 66122 CELL SVR (MISCELLANEOUS) ×3 IMPLANT
CHLORAPREP W/TINT 26ML (MISCELLANEOUS) ×5 IMPLANT
CLAMP ENDO BABCK 10MM (STAPLE) IMPLANT
CLEANER TIP ELECTROSURG 2X2 (MISCELLANEOUS) IMPLANT
CLIP APPLIE 5 13 M/L LIGAMAX5 (MISCELLANEOUS) IMPLANT
CLIP APPLIE ROT 10 11.4 M/L (STAPLE) IMPLANT
CLOSURE WOUND 1/2 X4 (GAUZE/BANDAGES/DRESSINGS)
CONT SPEC 4OZ CLIKSEAL STRL BL (MISCELLANEOUS) IMPLANT
COVER MAYO STAND STRL (DRAPES) ×10 IMPLANT
DECANTER SPIKE VIAL GLASS SM (MISCELLANEOUS) IMPLANT
DEVICE TROCAR PUNCTURE CLOSURE (ENDOMECHANICALS) IMPLANT
DRAPE LAPAROSCOPIC ABDOMINAL (DRAPES) ×5 IMPLANT
DRAPE LG THREE QUARTER DISP (DRAPES) ×5 IMPLANT
DRAPE POUCH INSTRU U-SHP 10X18 (DRAPES) IMPLANT
DRAPE TABLE BACK 44X90 PK DISP (DRAPES) IMPLANT
DRAPE WARM FLUID 44X44 (DRAPE) ×5 IMPLANT
DRSG OPSITE POSTOP 4X8 (GAUZE/BANDAGES/DRESSINGS) ×5 IMPLANT
ELECT REM PT RETURN 9FT ADLT (ELECTROSURGICAL) ×5
ELECTRODE REM PT RTRN 9FT ADLT (ELECTROSURGICAL) ×3 IMPLANT
ENSEAL DEVICE STD TIP 35CM (ENDOMECHANICALS) IMPLANT
GAUZE SPONGE 4X4 16PLY XRAY LF (GAUZE/BANDAGES/DRESSINGS) IMPLANT
GLOVE BIO SURGEON STRL SZ 6.5 (GLOVE) ×4 IMPLANT
GLOVE BIO SURGEON STRL SZ7.5 (GLOVE) ×20 IMPLANT
GLOVE BIO SURGEONS STRL SZ 6.5 (GLOVE) ×1
GLOVE BIOGEL PI IND STRL 7.0 (GLOVE) ×12 IMPLANT
GLOVE BIOGEL PI INDICATOR 7.0 (GLOVE) ×8
GLOVE EUDERMIC 7 POWDERFREE (GLOVE) ×20 IMPLANT
GOWN STRL REUS W/TWL LRG LVL3 (GOWN DISPOSABLE) ×5 IMPLANT
GOWN STRL REUS W/TWL XL LVL3 (GOWN DISPOSABLE) ×50 IMPLANT
HOLDER FOLEY CATH W/STRAP (MISCELLANEOUS) ×5 IMPLANT
KIT BASIN OR (CUSTOM PROCEDURE TRAY) ×5 IMPLANT
LEGGING LITHOTOMY PAIR STRL (DRAPES) ×10 IMPLANT
LIGASURE IMPACT 36 18CM CVD LR (INSTRUMENTS) IMPLANT
MANIFOLD NEPTUNE II (INSTRUMENTS) ×5 IMPLANT
MANIPULATOR UTERINE 4.5 ZUMI (MISCELLANEOUS) IMPLANT
NS IRRIG 1000ML POUR BTL (IV SOLUTION) ×10 IMPLANT
PACK GENERAL/GYN (CUSTOM PROCEDURE TRAY) ×5 IMPLANT
PENCIL BUTTON HOLSTER BLD 10FT (ELECTRODE) ×5 IMPLANT
POUCH SPECIMEN RETRIEVAL 10MM (ENDOMECHANICALS) IMPLANT
RELOAD PROXIMATE 75MM BLUE (ENDOMECHANICALS) ×10 IMPLANT
RTRCTR WOUND ALEXIS 18CM MED (MISCELLANEOUS) ×5
SCALPEL HARMONIC ACE (MISCELLANEOUS) ×5 IMPLANT
SCISSORS LAP 5X35 DISP (ENDOMECHANICALS) ×5 IMPLANT
SET IRRIG TUBING LAPAROSCOPIC (IRRIGATION / IRRIGATOR) ×5 IMPLANT
SHEET LAVH (DRAPES) IMPLANT
SLEEVE ENDOPATH XCEL 5M (ENDOMECHANICALS) ×10 IMPLANT
SOLUTION ANTI FOG 6CC (MISCELLANEOUS) ×5 IMPLANT
SPONGE GAUZE 4X4 12PLY (GAUZE/BANDAGES/DRESSINGS) IMPLANT
SPONGE LAP 18X18 X RAY DECT (DISPOSABLE) IMPLANT
STAPLER GUN LINEAR PROX 60 (STAPLE) ×5 IMPLANT
STAPLER PROXIMATE 75MM BLUE (STAPLE) ×5 IMPLANT
STAPLER VISISTAT 35W (STAPLE) IMPLANT
STRIP CLOSURE SKIN 1/2X4 (GAUZE/BANDAGES/DRESSINGS) IMPLANT
SUCTION POOLE TIP (SUCTIONS) ×5 IMPLANT
SUT PDS AB 1 CT1 27 (SUTURE) ×10 IMPLANT
SUT PDS AB 1 TP1 96 (SUTURE) IMPLANT
SUT SILK 2 0 (SUTURE) ×2
SUT SILK 2 0 SH CR/8 (SUTURE) ×5 IMPLANT
SUT SILK 2-0 18XBRD TIE 12 (SUTURE) ×3 IMPLANT
SUT SILK 3 0 (SUTURE) ×2
SUT SILK 3 0 SH CR/8 (SUTURE) ×10 IMPLANT
SUT SILK 3-0 18XBRD TIE 12 (SUTURE) ×3 IMPLANT
SUT VIC AB 3-0 PS2 18 (SUTURE)
SUT VIC AB 3-0 PS2 18XBRD (SUTURE) IMPLANT
SUT VIC AB 3-0 SH 18 (SUTURE) ×5 IMPLANT
SUT VICRYL 0 UR6 27IN ABS (SUTURE) IMPLANT
SYR CONTROL 10ML LL (SYRINGE) IMPLANT
SYS LAPSCP GELPORT 120MM (MISCELLANEOUS) ×5
SYSTEM LAPSCP GELPORT 120MM (MISCELLANEOUS) ×3 IMPLANT
TOWEL OR 17X26 10 PK STRL BLUE (TOWEL DISPOSABLE) ×10 IMPLANT
TOWEL OR NON WOVEN STRL DISP B (DISPOSABLE) ×10 IMPLANT
TRAY FOLEY CATH 14FRSI W/METER (CATHETERS) ×5 IMPLANT
TRAY LAP CHOLE (CUSTOM PROCEDURE TRAY) ×5 IMPLANT
TROCAR BLADELESS OPT 5 100 (ENDOMECHANICALS) ×5 IMPLANT
TROCAR BLADELESS OPT 5 75 (ENDOMECHANICALS) IMPLANT
TROCAR XCEL 12X100 BLDLESS (ENDOMECHANICALS) IMPLANT
TROCAR XCEL BLUNT TIP 100MML (ENDOMECHANICALS) ×5 IMPLANT
TROCAR XCEL NON-BLD 11X100MML (ENDOMECHANICALS) ×5 IMPLANT
TROCAR XCEL UNIV SLVE 11M 100M (ENDOMECHANICALS) IMPLANT
TUBING INSUFFLATION 10FT LAP (TUBING) ×5 IMPLANT
TUBING NON-CON 1/4 X 20 CONN (TUBING) IMPLANT
TUBING NON-CON 1/4 X 20' CONN (TUBING)
UNDERPAD 30X30 INCONTINENT (UNDERPADS AND DIAPERS) ×10 IMPLANT
WATER STERILE IRR 1500ML POUR (IV SOLUTION) IMPLANT
YANKAUER SUCT BULB TIP 10FT TU (MISCELLANEOUS) ×5 IMPLANT
YANKAUER SUCT BULB TIP NO VENT (SUCTIONS) ×5 IMPLANT

## 2013-10-22 NOTE — Interval H&P Note (Signed)
History and Physical Interval Note:  10/22/2013 7:32 AM  Cynthia [name]  has presented today for surgery, with the diagnosis of Colon cancer  The goals and the various methods of treatment have been discussed with the patient and family. After consideration of risks, benefits and other options for treatment, the patient has consented to  Procedure(s): LAPAROSCOPIC RIGHT COLECTOMY POSSIBLE OPEN (Right) LAPAROSCOPIC BILATERAL SALPINGO OOPHORECTOMY (Bilateral) as a surgical intervention .  The patient's history has been reviewed, patient examined, no change in status, stable for surgery.  I have reviewed the patient's chart and labs.  Questions were answered to the patient's satisfaction.     [name]

## 2013-10-22 NOTE — Op Note (Signed)
Patient Name:           Cynthia [name]   Date of Surgery:        10/22/2013  Pre op Diagnosis:      Locally advanced adenocarcinoma of the cecum,  simple, 9 cm right ovarian cyst  Post op Diagnosis:    Same  Procedure:                 Laparoscopic-assisted right colectomy, Bilateral salpingo-oophorectomy( performed by Dr. [name])   Surgeon:                     Edsel Petrin. Dalbert Batman, M.D., FACS  Assistant:                      Alphonsa Overall, M.D., FACS  Operative Indications:   Cynthia [name] is a 78 y.o. female. She is referred by Dr. Wilford Corner for evaluation and management of symptomatic adenocarcinoma of the cecum. Her primary care physician Dr. Jonathon Jordan.  This patient has enjoyed good health.  She's never had a colonoscopy. She had a near syncopal episode at church recently, , went to the ED and found to be anemic. She was transfused one unit of blood. She was placed on iron, which turned her stools black. . She says she has felt fatigued but denies any change in appetite. Denies seeing blood in her stools or dark stools. Denies abdominal pain nausea or vomiting. Minimal change in weight.  On January 22 her hemoglobin was 7.8. Now it is 10.Marland Kitchen Basic metabolic panel normal. No other labs.  A CT scan was performed on February 6 shows a 5 cm cecal mass extending into the terminal ileum compatible with a carcinoma. There were suspected extension and the pericolonic fat with adjacent small lymph nodes age and multiple benign-appearing liver cyst. There was a 11 x 16 mm soft tissue implant overlying the posterior right hepatic lobe possibly reflecting a peritoneal nodule.There is a large, simple right adnexal cyst. There was no evidence of obstruction by CT. A PET/CT Showed no enhancement in the liver or around the liver. Dr. Michail Sermon performed a colonoscopy shortly thereafter and found a large fungating ulcerated partially obstructing mass in the cecum which was circumferential. He was  able to visualize the ileocecal valve. Biopsy shows adenocarcinoma. She was evaluated by Dr. [name] because of the large right ovarian cyst. Bilateral salpingo-oophorectomy was recommended..  Comorbidities include a vaginal hysterectomy in her 46s for cervical cancer. Hypertension. Anemia. Hyperlipidemia. Mild mitral regurgitation   Operative Findings:       There was a bulky mass in the cecum but it did not have any gross evidence of invasion of surrounding structures. The retroperitoneum was clean. The right ureter was easily visualized. The duodenum was easily visualized. The liver looked normal. Special attention was paid to the undersurface of the right lobe of the liver,  back to the diaphragm and saw no abnormality. I did not take down the triangular ligament. The right ovary was  associated with a large cyst which was smooth and appeared benign. The left ovary looked normal. No sign of any metastatic disease.  Procedure in Detail:          Following the induction of general endotracheal anesthesia a Foley catheter was placed, oral gastric tube was placed, intravenous antibiotics were given. The patient was positioned in lithotomy position and the abdomen and perineum were prepped and draped in a sterile fashion. Surgical time out  was performed. An 11 mm Hassan trocar was placed in the midline just above the umbilicus with an open technique. This was secured with pursestring suture of 0 Vicryl. Pneumoperitoneum was created. The camera was inserted. 5 mm trocars were placed in the lower midline and a 5 mm trocar placed in the left lower quadrant.          I divided the terminal ileum, ileocecal valve, right tube and ovary with the cyst, hepatic flexure, transverse colon liver gallbladder stomach and duodenum. I divided the lateral peritoneal attachments of the cecum and then  divided the peritoneum overlying the terminal ileum and mobilized this very nicely. I was unable to sweep the terminal ileum and  right colon medially. Using the harmonic scalpel I divided some of its peritoneal attachments. The duodenum came into view and was easily visualized and avoided. I continued the dissection up to the hepatic flexure. Then using the harmonic scalpel I divided the attachments of the hepatic flexure and right transverse colon to the undersurface of the liver. After dividing the gastrohepatic omentum the hepatic flexure became quite mobile and  I had completed the mobilization. We identified the right ureter with peristalsis. There is no injury. I made a 7 cm vertical incision partly below and partly above the umbilicus, connecting it with the [facility] trocar. The fascia was incised in the midline. A wound protector was placed. The  terminal ileum and right colon and hepatic flexure were easily dekicered into the wound. The terminal ileum was transected with the GIA stapling device about 6 or 7 inches proximal to the ileocecal valve. The right transverse colon was transected just to the right of the middle colic vessels, preserving the middle colic vessels. Mesenteric vessels were isolated and divided.   smaller areas were divided with the harmonic scalpel. A small right branch of the middle colic vessels and the right colic vessels were clamped divided and ligated with 2 silk ties. The specimen was removed and sent to pathology. There was no bleeding. The mesentery was closed with interrupted sutures of 3-0 Vicryl. Anastomosis was created between the terminal ileum and the midtransverse colon with the GIA stapling device. The common defect was inspected. It was pink and viable and there  was no bleeding. Common defect was closed with a TA 60 stapling device. A few other sutures were placed in the staple line at critical points. We finished the closure of the mesentery. We tacked the omentum down on the staple line. We irrigated the area and everything looked fine. We dropped the small bowel and colon back into the  abdominal cavity.      At this point in the case Dr. [name] came in and performed a bilateral salpingo-oophorectomy. Because of the laxity of the abdominal wall she was able to do this through the existing extraction site. This was uneventful and will be dictated separately.      The midline fascia was closed with a running suture of #1 PDS. After irrigating the  Wound the skin was closed with a few skin staples and Telfa wicks. A re- insufflation was performed. I irrigated out the subhepatic subphrenic and pelvic areas and there was no bleeding. The anastomosis looked good. The pelvic surgery looked good. We released pneumoperitoneum and  Removed the trocars. The trocar sites were closed with skin staples. Appropriate protocol bandages were placed and the patient taken to PACU in stable condition. EBL less than 50 cc. Counts correct. Complications none.  Edsel Petrin. Dalbert Batman, M.D., FACS General and Minimally Invasive Surgery Breast and Colorectal Surgery  10/22/2013 10:05 AM

## 2013-10-22 NOTE — H&P (View-Only) (Signed)
Consult Note: Gyn-Onc  Cynthia [name] 78 y.o. female  CC:  Chief Complaint  Patient presents with  . Ovarian Cyst    New patient    HPI: Cynthia [name] is a 78 y.o. G4P4 who was referred to me today by Dr. Fanny Skates for a large right adnexal mass. She was referred to Dr. Dalbert Batman by  Dr. Wilford Corner for evaluation and management of symptomatic adenocarcinoma of the cecum. Her primary care physician Dr. Jonathon Jordan.   This patient has enjoyed good health. She doesn't see doctors much but does get an annual physical. She's never had a colonoscopy. She had a near syncopal episode at church went to the ED and found to be anemic. She was transfused one unit of blood. She was placed on iron, which turned her stools black.  On January 22 her hemoglobin was 7.8.   A CT scan was performed on September 20, 2013 shows a 5 cm cecal mass extending into the terminal ileum compatible with a carcinoma. There were suspected extension and the pericolonic fat with adjacent small lymph nodes age and multiple benign-appearing liver cyst. There was a 11 x 16 mm soft tissue implant overlying the posterior right hepatic lobe possibly reflecting a peritoneal nodule.There is a large right adnexal cyst. Status post hysterectomy. 7.0 x 5.8 cm right ovarian cyst  without malignant features on CT.  Dr. Michail Sermon performed a colonoscopy shortly thereafter and found a large fungating ulcerated partially obstructing mass in the cecum which was circumferential. There was no blood. He was able to visualize the ileocecal valve. Biopsy shows adenocarcinoma.  PET/CT 10/04/13 CHEST  No hypermetabolic mediastinal or hilar nodes. No suspicious pulmonary nodules on the CT scan. A nodular density adjacent to the major fissure on the left on image number 66 is likely an intrapulmonary lymph node.  ABDOMEN/PELVIS  Large right cecal mass as demonstrated on the prior CT scan demonstrating marked FDG uptake with SUV max of  30.0. I do not see any definite hypermetabolic pericecal lymph nodes. No hypermetabolic liver lesions to suggest liver metastasis. There are multiple benign appearing hepatic cysts. No retroperitoneal adenopathy. The 10 mm lymph node anterior to the right psoas muscle does not show any FDG uptake. There is also a small cluster of adjacent nodes which are not definitely a metabolically active. An 8 mm lymph node in the right lower pelvis on image number 159 does not show any hypermetabolism.  As demonstrated on the CT scan there is a large cyst associated with the right ovary. Hypermetabolism adjacent to this area laterally is probably the right ureter. Small irregular density in the right lower pelvis on image number 170 measuring approximately 12 mm does show hypermetabolism (SUV max 6.0) but would be an unusual drainage pattern for cecal cancer. Attention to this area on future studies is suggested.  SKELETON  No focal hypermetabolic activity to suggest skeletal metastasis.  IMPRESSION:  1. Large right cecal mass consistent with neoplasm with SUV max of 30.0  2. No findings to suggest hepatic metastatic disease.  3. Pericecal lymph nodes do not demonstrate any definite FDG uptake but are still worrisome for metastatic disease. A hypermetabolic lesion in the right lower pelvis needs close attention.  She is scheduled for surgery with Dr. Dalbert Batman on March 10. We will perform a concomitant bilateral salpingo-oophorectomy at that time.  Review of Systems  Constitutional: Denies fever. Skin: No rash, sores, jaundice, itching, or dryness.  Cardiovascular: No chest pain, shortness of breath, or  edema  Pulmonary: No cough or wheeze.  Gastro Intestinal:  No nausea, vomiting, early satiety Genitourinary: No frequency, urgency, or dysuria.  Denies vaginal bleeding and discharge.  Musculoskeletal: No myalgia, arthralgia, joint swelling or pain.  Neurologic: No weakness, numbness, or change in gait.      Current Meds:  Outpatient Encounter Prescriptions as of 10/17/2013  Medication Sig  . ferrous sulfate 325 (65 FE) MG tablet Take 325 mg by mouth 2 (two) times daily.  . ciprofloxacin (CIPRO) 500 MG tablet Take 500 mg by mouth 2 (two) times daily. Day before surgery 10-21-2013  . metroNIDAZOLE (FLAGYL) 500 MG tablet Take 500 mg by mouth 3 (three) times daily. Day before surgery 10-21-2013  . polyethylene glycol (GOLYTELY/NULYTELY) 236 G solution Take 4,000 mLs by mouth once.    Allergy: No Known Allergies  Social Hx:   History   Social History  . Marital Status: Widowed    Spouse Name: N/A    Number of Children: N/A  . Years of Education: N/A   Occupational History  . Not on file.   Social History Main Topics  . Smoking status: Never Smoker   . Smokeless tobacco: Never Used  . Alcohol Use: No  . Drug Use: No  . Sexual Activity: Not on file   Other Topics Concern  . Not on file   Social History Narrative  . No narrative on file    Past Surgical Hx: Vaginal hysterectomy 40 years ago for cervical dysplasia Past Surgical History  Procedure Laterality Date  . Abdominal hysterectomy      vaginal- partial  . Back surgery  1981    lower lumbar  . Tubal ligation  1968    Past Medical Hx:  Past Medical History  Diagnosis Date  . Cancer     cervical ca  . Anemia   . Blood transfusion without reported diagnosis jan 2015  . Colon cancer   . Heart murmur   . Hypertension     no bp meds   . Liver cyst   . Complication of anesthesia     slow to awaken in past    Oncology Hx:   No history exists.    Family Hx:  Family History  Problem Relation Age of Onset  . Diabetes Sister   . Cancer Sister     leukemia  . Diabetes Brother   . Heart failure Brother   . Hypertension Brother   . Breast cancer Maternal Aunt     Vitals:  There were no vitals taken for this visit. weight 133 pounds, height 5 foot 1, temperature 38.7, blood pressure 148/83, pulse 92,  respirations 16  Physical Exam: Well-nourished well-developed female in no acute distress.  Neck: Supple, no lymphadenopathy no thyromegaly.  Lungs: Clear to auscultation bilaterally.  Cardiovascular: Regular rate rhythm.   Abdomen: Well-healed vertical infraumbilical midline incision. Soft, nontender, nondistended.  Groins: No lymphadenopathy.  Extremities: No edema.  Pelvic: Normal external female genitalia. Bimanual examination reveals no masses or nodularity. Rectal was deferred.  Assessment/Plan: 78 year old with seek colon cancer who appears to have disease limited in that area however, was found have an incidental right ovarian cyst. She is scheduled for a laparoscopic-assisted right colectomy possible open colectomy on March 10. We will plan to proceed with a bilateral salpingo-oophorectomy at that time. I discussed with the patient and her daughter that we use whatever incisions Dr. Derrell LollingIngram has used for his surgery. We will send a right ovarian mass for frozen  section. However, I do not believe it represents malignancy as it has benign features both on CT scan and PET scan.  Her questions were elicited in answer to her satisfaction. She has been counseled regarding perioperative risk.  Cynthia Rodger A., MD 10/17/2013, 10:12 AM

## 2013-10-22 NOTE — Preoperative (Signed)
Beta Blockers   Reason not to administer Beta Blockers:Not Applicable 

## 2013-10-22 NOTE — Anesthesia Preprocedure Evaluation (Signed)
Anesthesia Evaluation  Patient identified by MRN, date of birth, ID band Patient awake    Reviewed: Allergy & Precautions, H&P , NPO status , Patient's Chart, lab work & pertinent test results  History of Anesthesia Complications (+) PROLONGED EMERGENCE and history of anesthetic complications  Airway Mallampati: II TM Distance: >3 FB Neck ROM: Full    Dental no notable dental hx.    Pulmonary COPD breath sounds clear to auscultation  Pulmonary exam normal       Cardiovascular hypertension, + Valvular Problems/Murmurs Rhythm:Regular Rate:Normal     Neuro/Psych negative neurological ROS  negative psych ROS   GI/Hepatic negative GI ROS, Neg liver ROS,   Endo/Other  negative endocrine ROS  Renal/GU negative Renal ROS  negative genitourinary   Musculoskeletal negative musculoskeletal ROS (+)   Abdominal   Peds negative pediatric ROS (+)  Hematology  (+) anemia ,   Anesthesia Other Findings   Reproductive/Obstetrics negative OB ROS                           Anesthesia Physical Anesthesia Plan  ASA: II  Anesthesia Plan: General   Post-op Pain Management:    Induction: Intravenous  Airway Management Planned: Oral ETT  Additional Equipment:   Intra-op Plan:   Post-operative Plan: Extubation in OR  Informed Consent: I have reviewed the patients History and Physical, chart, labs and discussed the procedure including the risks, benefits and alternatives for the proposed anesthesia with the patient or authorized representative who has indicated his/her understanding and acceptance.   Dental advisory given  Plan Discussed with: CRNA  Anesthesia Plan Comments:         Anesthesia Quick Evaluation

## 2013-10-22 NOTE — Anesthesia Postprocedure Evaluation (Signed)
  Anesthesia Post-op Note  Patient: Cynthia [name]  Procedure(s) Performed: Procedure(s) (LRB): LAPAROSCOPIC RIGHT COLECTOMY  (Right) BILATERALSALPINGO OOPHORECTOMY  Patient Location: PACU  Anesthesia Type: General  Level of Consciousness: awake and alert   Airway and Oxygen Therapy: Patient Spontanous Breathing  Post-op Pain: mild  Post-op Assessment: Post-op Vital signs reviewed, Patient's Cardiovascular Status Stable, Respiratory Function Stable, Patent Airway and No signs of Nausea or vomiting  Last Vitals:  Filed Vitals:   10/22/13 1200  BP: 113/53  Pulse: 64  Temp:   Resp: 11    Post-op Vital Signs: stable   Complications: No apparent anesthesia complications

## 2013-10-22 NOTE — Op Note (Signed)
PATIENT: Cynthia [name] DATE OF BIRTH: 10-Aug-1936 ENCOUNTER DATE: 10/22/2013   Preop Diagnosis: Colon cancer, simple 9 cm right ovarian cyst  Postoperative Diagnosis: same.   Surgery: bilateral salpingo-oophorectomy  Surgeons:  Imagene Gurney A. [name], MD; Fanny Skates, MD   Anesthesia: General   Estimated blood loss: Minimal ml   IVF:  Refer to anesthesia record   Urine output:  Refer to anesthesia record   Complications: None   Pathology: Bilateral tubes and ovaries  Operative findings: When I arrived in the operating room Dr. Dalbert Batman had completed a right hemicolectomy. There were two 5 mm laparoscopic ports and a vertical midline incision measuring approximately 7 cm.  Procedure: The patient was identified in the preoperative holding area. Informed consent was signed on the chart. Patient was seen history was reviewed and exam was performed.   I arrived to the operating room as stated above there were two 5 mm laparoscopic ports and a vertical midline incision measuring approximately 7 cm. Upon palpation the right adnexal mass was able to be elevated out of the abdomen. There was a long ovarian pedicle. It was clamped x2 with Kelly clamps and transected. The area was suture ligated with 2-0 Vicryl. The area was hemostatic. Similarly on the patient's left side the ovary was identified and grasped with a Babcock clamp. The ovarian vessels were identified. There were clamped x2 with a Kelly clamp transected and ligated with 2-0 suture.  The pedicles were inspected and noted to be hemostatic.  The right ovarian mass appeared to be a thin smooth ovarian cyst most consistent with cystadenoma. It was not sent for frozen section. The left ovary was atrophic in appearance and normal for age. This concludes the bilateral salpingo-oophorectomy. The remainder of the case will be dictated by Dr. Dalbert Batman.   This is Nancy Marus dictating an operative note on patient Cynthia [name].

## 2013-10-22 NOTE — Transfer of Care (Signed)
Immediate Anesthesia Transfer of Care Note  Patient: Cynthia [name]  Procedure(s) Performed: Procedure(s) (LRB): LAPAROSCOPIC RIGHT COLECTOMY  (Right) LAPAROSCOPIC BILATERAL SALPINGO OOPHORECTOMY (Bilateral)  Patient Location: PACU  Anesthesia Type: General  Level of Consciousness: sedated, patient cooperative and responds to stimulation  Airway & Oxygen Therapy: Patient Spontanous Breathing and Patient connected to face mask oxgen  Post-op Assessment: Report given to PACU RN and Post -op Vital signs reviewed and stable  Post vital signs: Reviewed and stable  Complications: No apparent anesthesia complications

## 2013-10-22 NOTE — Interval H&P Note (Signed)
History and Physical Interval Note:  10/22/2013 7:20 AM  Cynthia [name]  has presented today for surgery, with the diagnosis of Colon cancer  The various methods of treatment have been discussed with the patient and family. After consideration of risks, benefits and other options for treatment, the patient has consented to  Procedure(s): LAPAROSCOPIC RIGHT COLECTOMY POSSIBLE OPEN (Right) LAPAROSCOPIC BILATERAL SALPINGO OOPHORECTOMY (Bilateral) as a surgical intervention .  The patient's history has been reviewed, patient examined, no change in status, stable for surgery.  I have reviewed the patient's chart and labs.  Questions were answered to the patient's satisfaction.     [place] A.

## 2013-10-23 ENCOUNTER — Encounter (HOSPITAL_COMMUNITY): Payer: Self-pay | Admitting: General Surgery

## 2013-10-23 LAB — BASIC METABOLIC PANEL
BUN: 8 mg/dL (ref 6–23)
CHLORIDE: 106 meq/L (ref 96–112)
CO2: 26 mEq/L (ref 19–32)
CREATININE: 0.76 mg/dL (ref 0.50–1.10)
Calcium: 8.4 mg/dL (ref 8.4–10.5)
GFR, EST NON AFRICAN AMERICAN: 80 mL/min — AB (ref >90)
Glucose, Bld: 101 mg/dL — ABNORMAL HIGH (ref 70–99)
Potassium: 4.7 mEq/L (ref 3.7–5.3)
Sodium: 141 mEq/L (ref 137–147)

## 2013-10-23 LAB — CBC
HEMATOCRIT: 28 % — AB (ref 36.0–46.0)
Hemoglobin: 8.6 g/dL — ABNORMAL LOW (ref 12.0–15.0)
MCH: 24 pg — ABNORMAL LOW (ref 26.0–34.0)
MCHC: 30.7 g/dL (ref 30.0–36.0)
MCV: 78 fL (ref 78.0–100.0)
Platelets: 249 10*3/uL (ref 150–400)
RBC: 3.59 MIL/uL — ABNORMAL LOW (ref 3.87–5.11)
WBC: 10.2 10*3/uL (ref 4.0–10.5)

## 2013-10-23 NOTE — Progress Notes (Signed)
1 Day Post-Op Procedure(s) (LRB): LAPAROSCOPIC RIGHT COLECTOMY  (Right) BILATERALSALPINGO OOPHORECTOMY  Subjective: Patient reports doing well except for facial flushing that developed yesterday afternoon.  Stating that her "face feels slightly warm but the rest of my body feels cold."  Tolerating sips of clears this am.  Minimal pain reported with adequate pain relief with PRN medications.  Passing flatus.  Ambulating with assist.  Denies chest pain, dyspnea, or having a bowel movement.  No concerns voiced.  Objective: Vital signs in last 24 hours: Temp:  [97.4 F (36.3 C)-98.7 F (37.1 C)] 98.5 F (36.9 C) (03/11 1000) Pulse Rate:  [53-79] 68 (03/11 1000) Resp:  [10-18] 18 (03/11 1000) BP: (110-130)/(53-81) 123/81 mmHg (03/11 1000) SpO2:  [92 %-100 %] 92 % (03/11 1000) Weight:  [133 lb 9.6 oz (60.601 kg)] 133 lb 9.6 oz (60.601 kg) (03/10 1600) Last BM Date: 10/21/13  Intake/Output from previous day: 03/10 0701 - 03/11 0700 In: 5322.1 [P.O.:120; I.V.:5152.1; IV Piggyback:50] Out: 2225 [Urine:2200; Blood:25]  Physical Examination: General: alert, cooperative, no distress and mild facial flushing noted Resp: clear to auscultation bilaterally Cardio: regular rate and rhythm, S1, S2 normal, no murmur, click, rub or gallop GI: soft, non-tender; bowel sounds normal; no masses,  no organomegaly and incision: midline incision lightly stained and intact, lap site dressings clean, dry, and intact Extremities: extremities normal, atraumatic, no cyanosis or edema  Labs: WBC/Hgb/Hct/Plts:  10.2/8.6/28.0/249 (03/11 0450) BUN/Cr/glu/ALT/AST/amyl/lip:  8/0.76/--/--/--/--/-- (03/11 0450)  Assessment: 78 y.o. s/p Procedure(s): LAPAROSCOPIC RIGHT COLECTOMY  BILATERALSALPINGO OOPHORECTOMY: stable Pain:  Pain is well-controlled on PRN medications.  Heme:  Hgb 8.6 and Hct 28.0 this am.    CV: BP and HR stable post-operatively.  Continue to monitor.  GI:  Tolerating po:  Yes, clear  liquids  FEN:  Stable post-operatively.  Prophylaxis: intermittent pneumatic compression boots and lovenox daily.  Plan: Continue post-operative plan of care per Dr. Dalbert Batman Encourage IS use, deep breathing, and coughing   LOS: 1 day    CROSS, MELISSA DEAL 10/23/2013, 11:09 AM

## 2013-10-23 NOTE — Care Management Note (Signed)
    Page 1 of 1   10/23/2013     10:28:56 AM   CARE MANAGEMENT NOTE 10/23/2013  Patient:  Cynthia [name], Cynthia [name]   Account Number:  [id number]  Date Initiated:  10/23/2013  Documentation initiated by:  Sunday Spillers  Subjective/Objective Assessment:   78 yo female admitted s/p colectomy and oophorectomy. PTA lived at home with daughter.     Action/Plan:   Home when stable   Anticipated DC Date:  10/26/2013   Anticipated DC Plan:  [place]  CM consult      Choice offered to / List presented to:             Status of service:  Completed, signed off Medicare Important Message given?  NA - LOS <3 / Initial given by admissions (If response is "NO", the following Medicare IM given date fields will be blank) Date Medicare IM given:   Date Additional Medicare IM given:    Discharge Disposition:  HOME/SELF CARE  Per UR Regulation:  Reviewed for med. necessity/level of care/duration of stay  If discussed at [place] of Stay Meetings, dates discussed:    Comments:

## 2013-10-23 NOTE — Progress Notes (Addendum)
1 Day Post-Op  Subjective: Doing well. Alert. No respiratory problems. No nausea. Was to drink some liquids. Pain under good control.Vital signs stable. Good urine output.  Morning labs pending  I discussed the operative findings with her.  Objective: Vital signs in last 24 hours: Temp:  [97.4 F (36.3 C)-98.7 F (37.1 C)] 98.7 F (37.1 C) (03/11 0201) Pulse Rate:  [46-91] 68 (03/11 0201) Resp:  [10-18] 18 (03/11 0201) BP: (112-153)/(53-86) 114/71 mmHg (03/11 0201) SpO2:  [96 %-100 %] 100 % (03/11 0201) Weight:  [133 lb 9.6 oz (60.601 kg)] 133 lb 9.6 oz (60.601 kg) (03/10 1600) Last BM Date: 10/21/13  Intake/Output from previous day: 03/10 0701 - 03/11 0700 In: 5202.1 [I.V.:5152.1; IV Piggyback:50] Out: 1925 [Urine:1900; Blood:25] Intake/Output this shift: Total I/O In: 1404.2 [I.V.:1404.2] Out: 525 [Urine:525]    EXAM: General appearance: alert. Cooperative. No distress. Oriented. Mental status normal. Resp: clear to auscultation bilaterally GI: abdomen soft. Bowel sounds present. Not distended. Minimal, appropriate tenderness.  Wounds clean.  Lab Results:  Results for orders placed during the hospital encounter of 10/22/13 (from the past 24 hour(s))  TYPE AND SCREEN     Status: None   Collection Time    10/22/13  6:25 AM      Result Value Ref Range   ABO/RH(D) A POS     Antibody Screen NEG     Sample Expiration 10/25/2013    CBC     Status: Abnormal   Collection Time    10/22/13 10:38 AM      Result Value Ref Range   WBC 10.5  4.0 - 10.5 K/uL   RBC 4.01  3.87 - 5.11 MIL/uL   Hemoglobin 9.7 (*) 12.0 - 15.0 g/dL   HCT 31.0 (*) 36.0 - 46.0 %   MCV 77.3 (*) 78.0 - 100.0 fL   MCH 24.2 (*) 26.0 - 34.0 pg   MCHC 31.3  30.0 - 36.0 g/dL   RDW NOT CALCULATED  11.5 - 15.5 %   Platelets 252  150 - 400 K/uL  CBC     Status: Abnormal   Collection Time    10/23/13  4:50 AM      Result Value Ref Range   WBC 10.2  4.0 - 10.5 K/uL   RBC 3.59 (*) 3.87 - 5.11 MIL/uL   Hemoglobin 8.6 (*) 12.0 - 15.0 g/dL   HCT 28.0 (*) 36.0 - 46.0 %   MCV 78.0  78.0 - 100.0 fL   MCH 24.0 (*) 26.0 - 34.0 pg   MCHC 30.7  30.0 - 36.0 g/dL   RDW NOT CALCULATED  11.5 - 15.5 %   Platelets 249  150 - 400 K/uL     Studies/Results: @RISRSLT24 @  . alvimopan  12 mg Oral BID  . enoxaparin (LOVENOX) injection  40 mg Subcutaneous Q24H  . pneumococcal 23 valent vaccine  0.5 mL Intramuscular Tomorrow-1000     Assessment/Plan: s/p Procedure(s): LAPAROSCOPIC RIGHT COLECTOMY  BILATERALSALPINGO OOPHORECTOMY  POD #1. Laparoscopic right colectomy and BSO. Stable and doing well. Discontinue Foley Ambulate Clear liquid diet entereg Check labs and pathology.  Adenocarcinoma of cecum. Await pathology and staging. Has appt. With Dr. Benay Spice March 30.  Anemia. Monitor hemoglobin Hypertension. Well-controlled  @PROBHOSP @  LOS: 1 day    Bryn Perkin M 10/23/2013  . .prob

## 2013-10-24 DIAGNOSIS — I82409 Acute embolism and thrombosis of unspecified deep veins of unspecified lower extremity: Secondary | ICD-10-CM

## 2013-10-24 DIAGNOSIS — M79609 Pain in unspecified limb: Secondary | ICD-10-CM

## 2013-10-24 HISTORY — DX: Acute embolism and thrombosis of unspecified deep veins of unspecified lower extremity: I82.409

## 2013-10-24 MED ORDER — BISACODYL 10 MG RE SUPP: 10.0000 mg | Freq: Once | RECTAL | Status: DC

## 2013-10-24 MED ORDER — ALVIMOPAN 12 MG PO CAPS
12.0000 mg | ORAL_CAPSULE | Freq: Two times a day (BID) | ORAL | Status: DC
  Administered 2013-10-24 (×2): 12 mg via ORAL
  Filled 2013-10-24 (×4): qty 1

## 2013-10-24 MED ORDER — ENOXAPARIN SODIUM 60 MG/0.6ML ~~LOC~~ SOLN
60.0000 mg | Freq: Two times a day (BID) | SUBCUTANEOUS | Status: AC
  Administered 2013-10-24 – 2013-10-25 (×3): 60 mg via SUBCUTANEOUS
  Filled 2013-10-24 (×4): qty 0.6

## 2013-10-24 MED ORDER — ENOXAPARIN SODIUM 60 MG/0.6ML ~~LOC~~ SOLN: 1.0000 mg/kg | Freq: Two times a day (BID) | SUBCUTANEOUS | Status: DC

## 2013-10-24 MED ORDER — ENOXAPARIN SODIUM 30 MG/0.3ML ~~LOC~~ SOLN
20.0000 mg | Freq: Once | SUBCUTANEOUS | Status: AC
  Administered 2013-10-24: 20 mg via SUBCUTANEOUS
  Filled 2013-10-24: qty 0.2

## 2013-10-24 MED ORDER — METOPROLOL TARTRATE 1 MG/ML IV SOLN
2.5000 mg | Freq: Four times a day (QID) | INTRAVENOUS | Status: DC
Start: 2013-10-24 — End: 2013-10-27
  Administered 2013-10-24 – 2013-10-27 (×13): 2.5 mg via INTRAVENOUS
  Filled 2013-10-24 (×17): qty 5

## 2013-10-24 NOTE — Progress Notes (Signed)
Dr. Rosendo Gros aware of doppler results= positive clot was noted in rt calf. See new orders received.

## 2013-10-24 NOTE — Progress Notes (Signed)
2 Days Post-Op  Subjective: Alert and stable. Tolerating clear liquids without nausea. Says she passed a little flatus and had a small stool. Has ambulated some. Voiding okay with good urine output.  Only complaint is right lateral thigh pain. Denies swelling or pain below knee. Distorted last minute. BP has been elevated a little bit.  Pathology pending. Labs yesterday showed hemoglobin 8.6, WBC 10,200, potassium 4.7, creatinine 0.76, glucose 101.  Objective: Vital signs in last 24 hours: Temp:  [97.9 F (36.6 C)-98.6 F (37 C)] 97.9 F (36.6 C) (03/12 0537) Pulse Rate:  [62-83] 76 (03/12 0537) Resp:  [16-18] 18 (03/12 0537) BP: (118-166)/(78-88) 166/88 mmHg (03/12 0537) SpO2:  [92 %-98 %] 98 % (03/12 0537) Last BM Date: 10/21/13  Intake/Output from previous day: 03/11 0701 - 03/12 0700 In: 2395.4 [P.O.:480; I.V.:1915.4] Out: 1750 [Urine:1750] Intake/Output this shift: Total I/O In: 896.3 [I.V.:896.3] Out: 950 [Urine:950]   EXAM: General appearance: alert. Pleasant. Cooperative. Mental status normal. Does not appear in distress. Resp: clear to auscultation bilaterally GI: abdomen soft. Bowel sounds present, somewhat hypoactive. Wounds look good. Question borderline distended. She says abdomen is normal. Extremities: she localizes right thigh pain to the vastus lateralis with active range of motion, but not passive range of motion. Not really tender.. No swelling of thigh or calf. Homans sign negative. Excellent femoral and DP pulses.  Lab Results:  No results found for this or any previous visit (from the past 24 hour(s)).   Studies/Results: @RISRSLT24 @  . alvimopan  12 mg Oral BID  . bisacodyl  10 mg Rectal Once  . enoxaparin (LOVENOX) injection  40 mg Subcutaneous Q24H  . metoprolol  2.5 mg Intravenous 4 times per day  . pneumococcal 23 valent vaccine  0.5 mL Intramuscular Tomorrow-1000     Assessment/Plan: s/p Procedure(s): LAPAROSCOPIC RIGHT COLECTOMY   POD  #2. Laparoscopic right colectomy and BSO. Stable and doing well.  Full liquid diet  Ambulate more entereg  Await path  Adenocarcinoma of cecum. Await pathology and staging. Has appt. With Dr. Benay Spice March 30.   Right lateral thigh pain. History and physical exam suggested musculoskeletal pain. We'll apply a heating pad. We'll get Duplex to be sure there is no DVT, but doubt  Anemia. Monitor hemoglobin .  Labs tomorrow  Hypertension. Slightly elevated with thigh pain. Add Lopressor.   @PROBHOSP @  LOS: 2 days    Cynthia [name] M 10/24/2013  . .prob

## 2013-10-24 NOTE — Progress Notes (Deleted)
Pharmacy Brief Note - Alvimopan (Entereg)  The standing order set for alvimopan (Entereg) now includes an automatic order to discontinue the drug after the patient has had a bowel movement.  The change was approved by the [place] and the Medical Executive Committee.    This patient has had a bowel movement documented by nursing.  Therefore, alvimopan has been discontinued.  If there are questions, please contact the pharmacy at [phone].  Thank you  Vanessa Waldo, PharmD.   Pager:  [phone] 10:56 AM

## 2013-10-24 NOTE — Progress Notes (Signed)
*  PRELIMINARY RESULTS* Vascular Ultrasound Lower extremity venous duplex has been completed.  Preliminary findings: Right = There appears to be isolated DVT in the right calf, possibly a soleal vein. Left = no evidence of DVT.  Gave results to RN who will contact MD.   Landry Mellow, RDMS, RVT  10/24/2013, 10:37 AM

## 2013-10-25 LAB — BASIC METABOLIC PANEL
BUN: 7 mg/dL (ref 6–23)
CALCIUM: 8.6 mg/dL (ref 8.4–10.5)
CO2: 28 meq/L (ref 19–32)
Chloride: 103 mEq/L (ref 96–112)
Creatinine, Ser: 0.66 mg/dL (ref 0.50–1.10)
GFR calc Af Amer: >90 mL/min (ref >90)
GFR, EST NON AFRICAN AMERICAN: 84 mL/min — AB (ref >90)
Glucose, Bld: 95 mg/dL (ref 70–99)
Potassium: 4.5 mEq/L (ref 3.7–5.3)
SODIUM: 139 meq/L (ref 137–147)

## 2013-10-25 LAB — CBC
HCT: 30.5 % — ABNORMAL LOW (ref 36.0–46.0)
Hemoglobin: 9.3 g/dL — ABNORMAL LOW (ref 12.0–15.0)
MCH: 24 pg — AB (ref 26.0–34.0)
MCHC: 30.5 g/dL (ref 30.0–36.0)
MCV: 78.6 fL (ref 78.0–100.0)
Platelets: 251 10*3/uL (ref 150–400)
RBC: 3.88 MIL/uL (ref 3.87–5.11)
WBC: 9.6 10*3/uL (ref 4.0–10.5)

## 2013-10-25 MED ORDER — RIVAROXABAN 15 MG PO TABS
15.0000 mg | ORAL_TABLET | Freq: Two times a day (BID) | ORAL | Status: DC
  Administered 2013-10-26 – 2013-10-27 (×3): 15 mg via ORAL
  Filled 2013-10-25 (×5): qty 1

## 2013-10-25 NOTE — Progress Notes (Signed)
Patient stating "I am doing better..I think."  She reports tolerating diet with no nausea or emesis.  Reporting improvement in right upper leg discomfort and mobility.  "Yesterday I could barely move it and today is better."  No vaginal bleeding reported.  Voiding without difficulty.  Passing flatus and reports having two bowel movements this am.  "They are working on this clot in my leg."  Currently on lovenox to be transitioned to [place].  Follow up appointment arranged with Dr. [name] for post-operative follow up on 12/04/13 at 11:45am.  Advised to call for any questions or concerns.  Abdomen soft with midline dressing and lap site intact.  Continue post-operative plan of care per Dr. Dalbert Batman.

## 2013-10-25 NOTE — Progress Notes (Signed)
ANTICOAGULATION CONSULT NOTE - Initial Consult  Pharmacy Consult for Xarelto Indication: Calf-vein DVT  No Known Allergies  Patient Measurements: Height: 5' 1.75" (156.8 cm) Weight: 133 lb 9.6 oz (60.601 kg) IBW/kg (Calculated) : 49.53   Vital Signs: Temp: 97.2 F (36.2 C) (03/13 0540) Temp src: Oral (03/13 0540) BP: 148/87 mmHg (03/13 1110) Pulse Rate: 75 (03/13 1110)  Labs:  Recent Labs  10/23/13 0450 10/25/13 0435  HGB 8.6* 9.3*  HCT 28.0* 30.5*  PLT 249 251  CREATININE 0.76 0.66    Estimated Creatinine Clearance: 51.7 ml/min (by C-G formula based on Cr of 0.66).   Medical History: Past Medical History  Diagnosis Date  . Cancer     cervical ca  . Anemia   . Blood transfusion without reported diagnosis jan 2015  . Colon cancer   . Heart murmur   . Hypertension     no bp meds   . Liver cyst   . Complication of anesthesia     slow to awaken in past    Medications:  Scheduled:  . bisacodyl  10 mg Rectal Once  . enoxaparin (LOVENOX) injection  60 mg Subcutaneous Q12H  . metoprolol  2.5 mg Intravenous 4 times per day  . pneumococcal 23 valent vaccine  0.5 mL Intramuscular Tomorrow-1000  . [START ON 10/26/2013] Rivaroxaban  15 mg Oral BID WC   Infusions:  . 0.9 % NaCl with KCl 20 mEq / L 50 mL/hr at 10/24/13 1853   PRN: alum & mag hydroxide-simeth, HYDROmorphone (DILAUDID) injection, ondansetron (ZOFRAN) IV, ondansetron, oxyCODONE-acetaminophen, promethazine  Assessment: 78 y/o F with cecal adenocarcinoma and ovarian cyst, underwent laparoscopic-assisted R colectomy and bilateral salpingo-oophorectomy on 10/22/13.  Postoperatively she developed a duplex-proven R calf-vein DVT; treatment-dose Lovenox (5m/kg = 68mq12h) was started on 3/12.  Patient expressed fear regarding planned transition to warfarin so after consultation with hematology the attending MD has decided to transition to rivaroxaban (Xarelto) instead.   Orders received for pharmacy to assist  with Xarelto dosing recommendation.  No significant drug interactions identified between Xarelto and patient's usual medication regimen.  Estimated CrCl (52 mL/min) is well above the 30 mL/min threshold recommended for assessing eligibility to proceed with Xarelto.  Recommend: 1. Discontinue Lovenox after tonight's 6pm dose. 2. Tomorrow AM, begin Xarelto 15 mg BID with breakfast and supper x 21 days, then change to Xarelto 20 mg daily with supper until discontinued by MD. 3. No clotting time monitoring needed.   Recommended dosage should remain appropriate as long as estimated CrCl remains > 30 mL/min and there is no evidence of bleeding.   Met with patient and daughter, reviewed Xarelto teaching points, answered medication-related questions.  They expressed willingness to proceed with transition to Xarelto tomorrow morning.   Teaching kit provided, including a dose diary and a voucher card that should cover the cost of her first Xarelto prescription.  RaClayburn PertPharmD, BCPS Pager: [phone]/13/2015  1:25 PM

## 2013-10-25 NOTE — Discharge Instructions (Addendum)
Information on my medicine - XARELTO (rivaroxaban)  This medication education was reviewed with me or my healthcare representative as part of my discharge preparation.  The pharmacist that spoke with me during my hospital stay was:  Absher, Julieta Bellini, RPH  WHY WAS XARELTO PRESCRIBED FOR YOU? Xarelto was prescribed to treat blood clots that may have been found in the veins of your legs (deep vein thrombosis) or in your lungs (pulmonary embolism) and to reduce the risk of them occurring again.  What do you need to know about Xarelto? The starting dose is one 15 mg tablet taken TWICE daily with food for the FIRST 21 DAYS, then the dose is changed to one 20 mg tablet taken ONCE A DAY with your evening meal.  DO NOT stop taking Xarelto without talking to the health care provider who prescribed the medication.  Refill your prescription for 20 mg tablets before you run out.  After discharge, you should have regular check-up appointments with your healthcare provider that is prescribing your Xarelto.  In the future your dose may need to be changed if your kidney function changes by a significant amount.  What do you do if you miss a dose? If you are taking Xarelto TWICE DAILY and you miss a dose, take it as soon as you remember. You may take two 15 mg tablets (total 30 mg) at the same time then resume your regularly scheduled 15 mg twice daily the next day.  If you are taking Xarelto ONCE DAILY and you miss a dose, take it as soon as you remember on the same day then continue your regularly scheduled once daily regimen the next day. Do not take two doses of Xarelto at the same time.   Important Safety Information Xarelto is a blood thinner medicine that can cause bleeding. You should call your healthcare provider right away if you experience any of the following:   Bleeding from an injury or your nose that does not stop.   Unusual colored urine (red or dark brown) or unusual colored stools  (red or black).   Unusual bruising for unknown reasons.   A serious fall or if you hit your head (even if there is no bleeding).  Some medicines may interact with Xarelto and might increase your risk of bleeding while on Xarelto. To help avoid this, consult your healthcare provider or pharmacist prior to using any new prescription or non-prescription medications, including herbals, vitamins, non-steroidal anti-inflammatory drugs (NSAIDs) and supplements.  This website has more information on Xarelto: [email or web address]   ABDOMINAL SURGERY: POST OP INSTRUCTIONS  1. DIET: Follow a light bland diet the first 24 hours after arrival home, such as soup, liquids, crackers, etc.  Be sure to include lots of fluids daily.  Avoid fast food or heavy meals as your are more likely to get nauseated.  Do not eat any uncooked fruits or vegetables for the next 2 weeks as your colon heals. 2. Take your usually prescribed home medications unless otherwise directed. 3. PAIN CONTROL: a. Pain is best controlled by a usual combination of three different methods TOGETHER: i. Ice/Heat ii. Over the counter pain medication iii. Prescription pain medication b. Most patients will experience some swelling and bruising around the incisions.  Ice packs or heating pads (30-60 minutes up to 6 times a day) will help. Use ice for the first few days to help decrease swelling and bruising, then switch to heat to help relax tight/sore spots and speed recovery.  Some  people prefer to use ice alone, heat alone, alternating between ice & heat.  Experiment to what works for you.  Swelling and bruising can take several weeks to resolve.   c. It is helpful to take an over-the-counter pain medication regularly for the first few weeks.  Choose one of the following that works best for you: i. Naproxen (Aleve, etc)  Two 220mg  tabs twice a day ii. Ibuprofen (Advil, etc) Three 200mg  tabs four times a day (every meal &  bedtime) iii. Acetaminophen (Tylenol, etc) 500-650mg  four times a day (every meal & bedtime) d. A  prescription for pain medication (such as oxycodone, hydrocodone, etc) should be given to you upon discharge.  Take your pain medication as prescribed.  i. If you are having problems/concerns with the prescription medicine (does not control pain, nausea, vomiting, rash, itching, etc), please call us [phone] to see if we need to switch you to a different pain medicine that will work better for you and/or control your side effect better. ii. If you need a refill on your pain medication, please contact your pharmacy.  They will contact our office to request authorization. Prescriptions will not be filled after 5 pm or on week-ends. 4. Avoid getting constipated.  Between the surgery and the pain medications, it is common to experience some constipation.  Increasing fluid intake and taking a fiber supplement (such as Metamucil, Citrucel, FiberCon, MiraLax, etc) 1-2 times a day regularly will usually help prevent this problem from occurring.  A mild laxative (prune juice, Milk of Magnesia, MiraLax, etc) should be taken according to package directions if there are no bowel movements after 48 hours.   5. Watch out for diarrhea.  If you have many loose bowel movements, simplify your diet to bland foods & liquids for a few days.  Stop any stool softeners and decrease your fiber supplement.  Switching to mild anti-diarrheal medications (Kayopectate, Pepto Bismol) can help.  If this worsens or does not improve, please call us. 6. Wash / shower every day.  You may shower over the incision / wound.  Avoid baths until the skin is fully healed.  Continue to shower over incision(s) after the dressing is off. 7. Remove your waterproof bandages 5 days after surgery.  You may leave the incision open to air.  You may replace a dressing/Band-Aid to cover the incision for comfort if you wish. 8. ACTIVITIES as tolerated:    a. You may resume regular (light) daily activities beginning the next day--such as daily self-care, walking, climbing stairs--gradually increasing activities as tolerated.  If you can walk 30 minutes without difficulty, it is safe to try more intense activity such as jogging, treadmill, bicycling, low-impact aerobics, swimming, etc. b. Save the most intensive and strenuous activity for last such as sit-ups, heavy lifting, contact sports, etc  Refrain from any heavy lifting or straining until you are off narcotics for pain control.   c. DO NOT PUSH THROUGH PAIN.  Let pain be your guide: If it hurts to do something, don't do it.  Pain is your body warning you to avoid that activity for another week until the pain goes down. d. You may drive when you are no longer taking prescription pain medication, you can comfortably wear a seatbelt, and you can safely maneuver your car and apply brakes. e. Dennis Bast may have sexual intercourse when it is comfortable.  9. FOLLOW UP in our office a. Please call [facility] at [phone] to set up an appointment  to see your surgeon in the office for a follow-up appointment approximately 1-2 weeks after your surgery. b. Make sure that you call for this appointment the day you arrive home to insure a convenient appointment time. 10. IF YOU HAVE DISABILITY OR FAMILY LEAVE FORMS, BRING THEM TO THE OFFICE FOR PROCESSING.  DO NOT GIVE THEM TO YOUR DOCTOR.   WHEN TO CALL us [phone]: 1. Poor pain control 2. Reactions / problems with new medications (rash/itching, nausea, etc)  3. Fever over 101.5 F (38.5 C) 4. Inability to urinate 5. Nausea and/or vomiting 6. Worsening swelling or bruising 7. Continued bleeding from incision. 8. Increased pain, redness, or drainage from the incision  The clinic staff is available to answer your questions during regular business hours (8:30am-5pm).  Please dont hesitate to call and ask to speak to one of our nurses for clinical concerns.    A surgeon from [facility] is always on call at the hospitals   If you have a medical emergency, go to the nearest emergency room or call 911.    [facility], [place], [place], [place], [place]  [address] ? MAIN: [phone]) [phone] ? TOLL FREE: [phone] ? FAX [phone] [email or web address]

## 2013-10-25 NOTE — Progress Notes (Addendum)
3 Days Post-Op  Subjective: Tolerating full occluded diet without nausea or vomiting. Passing some flatus and had another small bowel movement. Doesn't really feel hungry and did not advance to solid food yet.  She states that her right thigh pain is much better.  Duplex venous study of lower extremities showed DVT of the right soleal calf vein. She was started on full dose Lovenox last night. I discussed this with her. She is fearful of Coumadin and really does not want to have Lovenox shots long-term. She asks if there is an alternative.  Pathology report discussed and given to patient. Locally invasive adenocarcinoma of cecum involving serosa and the terminal ileum. 15 nodes negative.   T4a, N0.   Both ovaries and right ovarian cyst benign.  Objective: Vital signs in last 24 hours: Temp:  [97.2 F (36.2 C)-97.8 F (36.6 C)] 97.2 F (36.2 C) (03/13 0540) Pulse Rate:  [73-87] 83 (03/13 0540) Resp:  [18] 18 (03/13 0540) BP: (131-164)/(83-93) 164/89 mmHg (03/13 0540) SpO2:  [93 %-96 %] 93 % (03/13 0540) Last BM Date: 10/23/13  Intake/Output from previous day: 03/12 0701 - 03/13 0700 In: 2499.6 [P.O.:1680; I.V.:819.6] Out: 2400 [Urine:2400] Intake/Output this shift: Total I/O In: 1141.7 [P.O.:960; I.V.:181.7] Out: 900 [Urine:900]   EXAM: General appearance: alert and cooperative. Mental status normal. In no distress. Resp: clear to auscultation bilaterally GI: abdomen is soft. Minimally tender. Wounds are clean  and no sign of infection. Excellent bowel sounds. Extremities: lower extremities without edema. Nontender right thigh. Nontender right calf. Homans sign negative.  Lab Results:  Results for orders placed during the hospital encounter of 10/22/13 (from the past 24 hour(s))  CBC     Status: Abnormal   Collection Time    10/25/13  4:35 AM      Result Value Ref Range   WBC 9.6  4.0 - 10.5 K/uL   RBC 3.88  3.87 - 5.11 MIL/uL   Hemoglobin 9.3 (*) 12.0 - 15.0 g/dL   HCT  30.5 (*) 36.0 - 46.0 %   MCV 78.6  78.0 - 100.0 fL   MCH 24.0 (*) 26.0 - 34.0 pg   MCHC 30.5  30.0 - 36.0 g/dL   RDW NOT CALCULATED  11.5 - 15.5 %   Platelets 251  150 - 400 K/uL  BASIC METABOLIC PANEL     Status: Abnormal   Collection Time    10/25/13  4:35 AM      Result Value Ref Range   Sodium 139  137 - 147 mEq/L   Potassium 4.5  3.7 - 5.3 mEq/L   Chloride 103  96 - 112 mEq/L   CO2 28  19 - 32 mEq/L   Glucose, Bld 95  70 - 99 mg/dL   BUN 7  6 - 23 mg/dL   Creatinine, Ser 0.66  0.50 - 1.10 mg/dL   Calcium 8.6  8.4 - 10.5 mg/dL   GFR calc non Af Amer 84 (*) >90 mL/min   GFR calc Af Amer >90  >90 mL/min     Studies/Results: @RISRSLT24 @  . alvimopan  12 mg Oral BID  . bisacodyl  10 mg Rectal Once  . enoxaparin (LOVENOX) injection  60 mg Subcutaneous Q12H  . metoprolol  2.5 mg Intravenous 4 times per day  . pneumococcal 23 valent vaccine  0.5 mL Intramuscular Tomorrow-1000     Assessment/Plan: s/p Procedure(s): LAPAROSCOPIC RIGHT COLECTOMY  BILATERALSALPINGO OOPHORECTOMY   POD #3. Laparoscopic right colectomy and BSO. Stable and doing  well.  Full liquid diet , advance as tol Ambulate more  entereg  Change dressing today, remove wicks. Home Sat./Sun.  Adenocarcinoma of cecum. T4a, N0 as above. . Has appt. with Dr. Benay Spice March 30.   DVT right soleal vein. Right thigh pain may be incidental and unrelated.  She needs anticoagulation to prevent propagation for a few months. Fearful of Coumadin so I will ask Dr. Julieanne Manson to advise regarding outpatient management and surveillance, since he is going to see her March 30 anyway for medical oncology consultation. Maybe can use Xarelto.  Anemia. Monitor hemoglobin . Hgb 9.3 today...better.  Hypertension.  Better.  Lopressor for now.. Anticipate resolution as pain resolves. Was on no meds as outpatient.   @PROBHOSP @  LOS: 3 days    Khamron Gellert M 10/25/2013  . .prob

## 2013-10-25 NOTE — Progress Notes (Signed)
I discussed the patient's right calf DVT with Dr. Murriel Hopper. He thinks that Xarelto will be a good option for her.  His recommendation was loading dose of 15 mg twice a day for 3 weeks and then 20 mg per day. Alternatively he suggested pharmacy consult.  I have requested pharmacy consult for Xarelto for treatment of DVT right calf.. The plan would be to continue Lovenox until pm dose today  and then discontinue Lovenox and start Xarelto tomorrow morning. Will await pharmacy recommendations for outpatient dosing.   Edsel Petrin. Dalbert Batman, M.D., [facility] General and Minimally invasive Surgery Breast and Colorectal Surgery Office:   [phone] Pager:   [phone]

## 2013-10-26 NOTE — Progress Notes (Signed)
4 Days Post-Op  Subjective: Tolerating full liquids, having bowel function. Diet advanced in orders but pt unaware she could order more.  She will try this for lunch today.   Still has some right thigh pain, but it is much better.  Duplex venous study of lower extremities showed DVT of the right soleal calf vein. She started Xarelto this morning.    Pathology report discussed and given to patient. Locally invasive adenocarcinoma of cecum involving serosa and the terminal ileum. 15 nodes negative.   T4a, N0.   Both ovaries and right ovarian cyst benign.  Objective: Vital signs in last 24 hours: Temp:  [97.9 F (36.6 C)-99 F (37.2 C)] 98.2 F (36.8 C) (03/14 0531) Pulse Rate:  [73-78] 73 (03/14 0531) Resp:  [16-18] 16 (03/14 0531) BP: (146-153)/(80-88) 146/88 mmHg (03/14 0531) SpO2:  [95 %-96 %] 95 % (03/14 0531) Last BM Date: 10/25/13  Intake/Output from previous day: 03/13 0701 - 03/14 0700 In: 1618.3 [P.O.:480; I.V.:1138.3] Out: 3752 [Urine:3750; Stool:2] Intake/Output this shift:     EXAM: General appearance: alert and cooperative. Mental status normal. In no distress. Resp: clear to auscultation bilaterally GI: abdomen is soft. Minimally tender. Wounds are clean  and no sign of infection. Excellent bowel sounds. Extremities: lower extremities without edema. Nontender right thigh. Nontender right calf. Homans sign negative.  Lab Results:  No results found for this or any previous visit (from the past 24 hour(s)).   Studies/Results: @RISRSLT24 @  . bisacodyl  10 mg Rectal Once  . metoprolol  2.5 mg Intravenous 4 times per day  . pneumococcal 23 valent vaccine  0.5 mL Intramuscular Tomorrow-1000  . Rivaroxaban  15 mg Oral BID WC     Assessment/Plan: s/p Procedure(s): LAPAROSCOPIC RIGHT COLECTOMY  BILATERALSALPINGO OOPHORECTOMY   POD #4. Laparoscopic right colectomy and BSO. Stable and doing well.  Cont reg diet Ambulate more  Home Sat./Sun.  Adenocarcinoma  of cecum. T4a, N0 as above. . Has appt. with Dr. Benay Spice March 30.   DVT right soleal vein. Right thigh pain may be incidental and unrelated.  She needs anticoagulation to prevent propagation for a few months.  If continues to do well today, will d/c tom on 15mg  bid Xarelto dosing.   Anemia. Monitor hemoglobin .   Hypertension.  Better.  Lopressor for now.. Anticipate resolution as pain resolves. Was on no meds as outpatient.   @PROBHOSP @  LOS: 4 days    Myrel Rappleye C. 01/29/736  . .prob

## 2013-10-27 DIAGNOSIS — I824Z9 Acute embolism and thrombosis of unspecified deep veins of unspecified distal lower extremity: Secondary | ICD-10-CM

## 2013-10-27 LAB — CBC
HCT: 31.2 % — ABNORMAL LOW (ref 36.0–46.0)
Hemoglobin: 10 g/dL — ABNORMAL LOW (ref 12.0–15.0)
MCH: 24.4 pg — ABNORMAL LOW (ref 26.0–34.0)
MCHC: 32.1 g/dL (ref 30.0–36.0)
MCV: 76.3 fL — ABNORMAL LOW (ref 78.0–100.0)
PLATELETS: 325 10*3/uL (ref 150–400)
RBC: 4.09 MIL/uL (ref 3.87–5.11)
WBC: 6.6 10*3/uL (ref 4.0–10.5)

## 2013-10-27 MED ORDER — OXYCODONE-ACETAMINOPHEN 5-325 MG PO TABS: 1.0000 | ORAL_TABLET | ORAL | Status: DC | PRN

## 2013-10-27 MED ORDER — RIVAROXABAN 15 MG PO TABS: 15.0000 mg | ORAL_TABLET | Freq: Two times a day (BID) | ORAL | Status: DC

## 2013-10-27 NOTE — Discharge Summary (Signed)
Physician Discharge Summary  Patient ID: Cynthia [name] MRN: [id number] DOB/AGE: [birth year] 78 y.o.  Admit date: 10/22/2013 Discharge date: 10/27/2013  Admission Diagnoses: colon cancer  Discharge Diagnoses:  Active Problems:   Malignant neoplasm of ascending colon   DVT, lower extremity, distal   Discharged Condition: good  Hospital Course: The patient was admitted to the hospital after laparoscopic assisted right hemicolectomy. She tolerated this well. Her Foley was removed by postop day 2. Her diet was advanced slowly. She began to have bowel movements without difficulty. Her pain was controlled. She did develop some right leg pain and an ultrasound of her leg was obtained. This showed an acute DVT in her lower calf. She was started on therapeutic Lovenox and then transitioned to Xarelto. She was discharged to home on postop day 5 in stable condition.  Consults: pharmacy  Significant Diagnostic Studies: labs: cbc, chemistries and LE [place]  Treatments: IV hydration, analgesia: acetaminophen w/ codeine and anticoagulation: Xarelto   Discharge Exam: Blood pressure 149/83, pulse 73, temperature 98.1 F (36.7 C), temperature source Oral, resp. rate 16, height 5' 1.75" (1.568 m), weight 133 lb 9.6 oz (60.601 kg), SpO2 97.00%. General appearance: alert and cooperative GI: normal findings: soft, non-tender Incision/Wound: small amt of erythema at umbilicus. Does not appear to be cellulitic but more reactive    Disposition: 01-Home or Self Care   Future Appointments Provider Department Dept Phone   11/08/2013 1:30 PM [name], [facility], [place] [phone]   11/11/2013 1:30 PM [facility] [phone]   11/11/2013 2:00 PM Ladell Pier, MD [facility] [phone]   12/04/2013 11:45 AM Paola A. [name], [facility] [phone]        Medication List    STOP taking these medications       CIPRO 500 MG tablet  Generic drug:  ciprofloxacin     metroNIDAZOLE 500 MG tablet  Commonly known as:  FLAGYL     polyethylene glycol 236 G solution  Commonly known as:  GoLYTELY/NuLYTELY      TAKE these medications       ferrous sulfate 325 (65 FE) MG tablet  Take 325 mg by mouth 2 (two) times daily.     oxyCODONE-acetaminophen 5-325 MG per tablet  Commonly known as:  PERCOCET/ROXICET  Take 1-2 tablets by mouth every 4 (four) hours as needed for moderate pain.     Rivaroxaban 15 MG Tabs tablet  Commonly known as:  XARELTO  Take 1 tablet (15 mg total) by mouth 2 (two) times daily with a meal.           Follow-up Information   Follow up with LIDCVU,DTHYH A., MD On 12/04/2013. (at 11:45am at the [facility])    Specialty:  Gynecologic Oncology   Contact information:   [address] [address] [phone]       Follow up with [name], MD. (as scheduled)    Specialty:  General Surgery   Contact information:   [address] [place] [address] [phone]       Follow up with [name], MD. (as scheduled)    Specialty:  Oncology   Contact information:   [place] [address] [phone]       Signed: Rosario Adie 6/43/8381, 8:40 AM

## 2013-11-01 ENCOUNTER — Encounter (HOSPITAL_COMMUNITY): Payer: Self-pay

## 2013-11-01 ENCOUNTER — Ambulatory Visit (INDEPENDENT_AMBULATORY_CARE_PROVIDER_SITE_OTHER): Payer: Medicare Other

## 2013-11-01 DIAGNOSIS — Z4802 Encounter for removal of sutures: Secondary | ICD-10-CM

## 2013-11-01 NOTE — Progress Notes (Signed)
Pt comes in today 10 s/p laparoscopic right colectomy for staple removal. Incision has healed nicely. No signs of infection. All staples were removed without difficulty. Steri strips were applied. Pt to follow up at scheduled appointment time.

## 2013-11-04 ENCOUNTER — Encounter (INDEPENDENT_AMBULATORY_CARE_PROVIDER_SITE_OTHER): Payer: Self-pay

## 2013-11-08 ENCOUNTER — Encounter (INDEPENDENT_AMBULATORY_CARE_PROVIDER_SITE_OTHER): Payer: Medicare Other | Admitting: General Surgery

## 2013-11-08 ENCOUNTER — Ambulatory Visit (INDEPENDENT_AMBULATORY_CARE_PROVIDER_SITE_OTHER): Payer: Medicare Other | Admitting: General Surgery

## 2013-11-08 ENCOUNTER — Encounter (INDEPENDENT_AMBULATORY_CARE_PROVIDER_SITE_OTHER): Payer: Self-pay | Admitting: General Surgery

## 2013-11-08 VITALS — BP 150/78 | HR 78 | Temp 97.2°F | Resp 16 | Ht 61.0 in | Wt 129.8 lb

## 2013-11-08 DIAGNOSIS — C182 Malignant neoplasm of ascending colon: Secondary | ICD-10-CM

## 2013-11-08 DIAGNOSIS — I824Z9 Acute embolism and thrombosis of unspecified deep veins of unspecified distal lower extremity: Secondary | ICD-10-CM

## 2013-11-08 DIAGNOSIS — D509 Iron deficiency anemia, unspecified: Secondary | ICD-10-CM

## 2013-11-08 NOTE — Patient Instructions (Signed)
You are doing well following your colon surgery. We have reviewed your pathology.  Keep your appointment with Dr. Benay Spice next week. He will discuss with you whether you need any other form of treatment  Continue to follow with your primary care physician regarding management of your anemia  Continue to take xarelto for the blood clot in your right calf. Discuss the duration of therapy with Dr. Benay Spice. He is an expert in blood disorders.  Return to see Dr. Dalbert Batman in approximately 5 weeks.

## 2013-11-08 NOTE — Progress Notes (Signed)
Patient ID: Cynthia [name], female   DOB: 06-01-1936, 78 y.o.   MRN: [id number] History: This patient underwent laparoscopic-assisted right colectomy And BSS.on 10/22/2013. While in the hospital she had some right thigh pain. We did a duplex scan, and surprisingly, we found a DVT and a deep calf vein. She is on Xarelto at this time. The pain has resolved and she feels very good. Appetite is improving. Bowel movements are basically normal, usually solid occasionally  loose. No abdominal pain or nausea. No wound problems. Her pathology report showed a large cancer in the right colon, stage T4 A., N0. Tubes and ovaries were benign.She is scheduled to see Dr. Julieanne Manson this coming Monday. I've asked her to discuss the duration of the xarelto with him.  Exam: Patient looks very good. Her daughter is with her. No distress Abdomen soft. Flat. Nontender. Wounds looked good. Slight erythema but no platelets.  Assessment: Locally advanced cancer of the right colon, pathologic stage TIV A., N0. Recovering uneventfully following laparoscopic right colectomy and BSO. DVT right calf vein. Asymptomatic on xarelto Iron deficiency anemia, improving on iron, being followed by Dr. Stephanie Acre.  Plan: To  see Dr. Benay Spice  next week. He will discuss whether she needs adjuvant therapy or no. T.  He also asked him to address the duration of the Xarelto therapy Iron deficiency anemia is being managed and monitored by Dr. Lottie Dawson  return to see me in 5 weeks I discussed diet and activities.   Edsel Petrin. Dalbert Batman, M.D., [facility] General and Minimally invasive Surgery Breast and Colorectal Surgery Office:   [phone] Pager:   [phone]

## 2013-11-11 ENCOUNTER — Encounter: Payer: Self-pay | Admitting: Oncology

## 2013-11-11 ENCOUNTER — Ambulatory Visit (HOSPITAL_BASED_OUTPATIENT_CLINIC_OR_DEPARTMENT_OTHER): Payer: 59 | Admitting: Oncology

## 2013-11-11 ENCOUNTER — Other Ambulatory Visit: Payer: Self-pay | Admitting: *Deleted

## 2013-11-11 ENCOUNTER — Ambulatory Visit: Payer: Medicare Other

## 2013-11-11 ENCOUNTER — Telehealth: Payer: Self-pay | Admitting: Oncology

## 2013-11-11 VITALS — BP 166/90 | HR 91 | Temp 97.8°F | Resp 18 | Ht 61.0 in | Wt 130.7 lb

## 2013-11-11 DIAGNOSIS — Z8541 Personal history of malignant neoplasm of cervix uteri: Secondary | ICD-10-CM

## 2013-11-11 DIAGNOSIS — C189 Malignant neoplasm of colon, unspecified: Secondary | ICD-10-CM

## 2013-11-11 DIAGNOSIS — C20 Malignant neoplasm of rectum: Secondary | ICD-10-CM

## 2013-11-11 DIAGNOSIS — I82409 Acute embolism and thrombosis of unspecified deep veins of unspecified lower extremity: Secondary | ICD-10-CM

## 2013-11-11 DIAGNOSIS — D279 Benign neoplasm of unspecified ovary: Secondary | ICD-10-CM

## 2013-11-11 DIAGNOSIS — C182 Malignant neoplasm of ascending colon: Secondary | ICD-10-CM

## 2013-11-11 DIAGNOSIS — D509 Iron deficiency anemia, unspecified: Secondary | ICD-10-CM

## 2013-11-11 NOTE — Progress Notes (Signed)
Checked in new pt with no financial concerns. °

## 2013-11-11 NOTE — Telephone Encounter (Signed)
PER POF DR SHERRILL REQ TO [facility] APPT W/BARB NEFF AND 6MTH APPT/SCH PT FOR 10/5 @9 :45 & DR @10 :15-PRINTED & GAVE PT [facility]

## 2013-11-11 NOTE — Progress Notes (Signed)
Met with Cynthia [name] and daughter.   Explained role of nurse navigator. Educational information provided on colorectal cancer  Referral made to dietician for diet education. [facility] resources provided to patient, including SW service information.  Contact names and phone numbers were provided for entire [facility] team.   Patient has chosen not to take any treatment.  She was advised by MD to eat a healthy diet, exercise, and have a colonoscopy in one year.  She is deciding on whether or not to continue follow-up with Oncology or primary care.  No barriers to care identified.  Patient verbalized comprehension and teach back method was used.  Will continue to follow as needed.

## 2013-11-11 NOTE — Progress Notes (Signed)
[place] Patient Consult   Referring MD: Anica Alcaraz 78 y.o.  May 01, 1936    Reason for Referral: Colon cancer   HPI: She was admitted in January with exertional dyspnea and presyncope. She was found to have severe anemia. The hemoglobin returned at 7.2 with an MCV of 64.1 and ferritin of 3. She was transfused with one unit of packed red blood cells and placed on iron.  She was referred to Dr. Michail Sermon and was taken to a colonoscopy 09/25/2013. A fungating ulcerated mass was noted in the cecum. The mass was circumferential. Biopsies were obtained. The pathology revealed invasive adenocarcinoma.  A CT the abdomen and pelvis on 09/20/2013 revealed multiple hepatic cysts. A cecal mass was noted extending into the terminal ileum. Small pericecal lymph nodes measured up to 6 mm. An 11 x 16 mm soft tissue implant was noted overlying the posterior right hepatic lobe possibly reflecting a peritoneal nodule. A 7 cm right ovarian cyst was noted. A CT of the chest 09/04/2013 when she presented with Exertional dyspnea revealed no pulmonary embolism.  A PET scan 10/04/2013 revealed no hypermetabolic mediastinal or hilar nodes. The cecal mass had an SUV of 30. No hypermetabolic liver lesions. No hypermetabolic. Cecal lymph nodes. The 10 mm lymph node anterior to the right psoas muscle showed no FDG uptake. A small a regular density in the right pelvis measured 12 mm with an SUV of 6.  She was referred to Dr. Dalbert Batman and was taken the operating room on 10/22/2013 for a laparoscopic assisted right colectomy and bilateral oophorectomy. A mass was noted in the cecum without gross evidence of invasion of surrounding structures. The liver appeared normal. No abnormality was noted at the undersurface of the right liver. A benign-appearing cyst was seen in the right ovary. The left ovary appeared normal. No evidence of metastatic disease.  The pathology ([id number])  revealed an invasive moderately differentiated adenocarcinoma of the right colon invading through the muscularis propria and involving the serosa. 16 lymph nodes were negative for metastatic carcinoma. The resection margins were negative. No lymphovascular or perineural invasion. No tumor perforation. Intratumoral lymphocytes and a Crohn's reaction were noted at the periphery of the tumor. Mismatch repair protein expression was normal. The tumor returned microsatellite stable. The right ovary returned with a benign serous cystadenofibroma. The left ovary was benign.  She reports an uneventful operative recovery.  Past Medical History  Diagnosis Date  . Cancer  30s     cervical ca  . Anemia-iron deficiency   January 2015   .  G4 P4    . Colon cancer-cecum (T4 a, N0)   10/22/2013   . Heart murmur   . Hypertension     no bp meds   .    Marland Kitchen Complication of anesthesia     slow to awaken in past    Past Surgical History  Procedure Laterality Date  . Abdominal hysterectomy      vaginal- partial  . Back surgery  1981    lower lumbar  . Tubal ligation  1968  . Laparoscopic right colectomy Right 10/22/2013    Procedure: LAPAROSCOPIC RIGHT COLECTOMY ;  Surgeon: [name], MD;  Location: [facility];  Service: General;  Laterality: Right;  . Salpingoophorectomy  10/22/2013    Procedure: Marquette Saa;  Surgeon: [name]. [name], MD;  Location: [facility];  Service: Gynecology;;    Medications: Reviewed  Allergies: No Known Allergies  Family history:  Her maternal grandmother had "stomach cancer ", a maternal great aunt had breast cancer. A sister has "leukemia ". No history of colon, rectal, or uterine cancer  Social History:   She lives with her daughter in [place]. She is retired from the [employer]. She does not use tobacco or alcohol. No transfusion history prior to the admission in January 2015. No risk factor for HIV or hepatitis     ROS:   Positives  include: Dyspnea on exertion prior to surgery, right thigh pain following surgery-diagnosed with a DVT-the pain has resolved  A complete ROS was otherwise negative.  Physical Exam:  Blood pressure 166/90, pulse 91, temperature 97.8 F (36.6 C), temperature source Oral, resp. rate 18, height $RemoveBe'5\' 1"'EJysGHQyS$  (1.549 m), weight 130 lb 11.2 oz (59.285 kg), SpO2 100.00%.  HEENT: Oropharynx without visible mass, neck without mass Lungs: Clear bilaterally Cardiac: Regular rate and rhythm Abdomen: No hepatosplenomegaly, healed midline incision, no mass  Vascular: Trace edema at the right lower leg Lymph nodes: No cervical, supra-clavicular, axillary, or inguinal nodes Neurologic: Alert and oriented, the motor she appears intact in the upper and lower extremities Skin: No rash Musculoskeletal: No spine tenderness   LAB:  CBC  Lab Results  Component Value Date   WBC 6.6 10/27/2013   HGB 10.0* 10/27/2013   HCT 31.2* 10/27/2013   MCV 76.3* 10/27/2013   PLT 325 10/27/2013   NEUTROABS 5.5 10/17/2013     CMP      Component Value Date/Time   NA 139 10/25/2013 0435   K 4.5 10/25/2013 0435   CL 103 10/25/2013 0435   CO2 28 10/25/2013 0435   GLUCOSE 95 10/25/2013 0435   BUN 7 10/25/2013 0435   CREATININE 0.66 10/25/2013 0435   CREATININE 0.79 09/27/2013 1548   CALCIUM 8.6 10/25/2013 0435   PROT 6.9 10/17/2013 0830   ALBUMIN 3.4* 10/17/2013 0830   AST 19 10/17/2013 0830   ALT 8 10/17/2013 0830   ALKPHOS 42 10/17/2013 0830   BILITOT 0.4 10/17/2013 0830   GFRNONAA 84* 10/25/2013 0435   GFRAA >90 10/25/2013 0435    Lab Results  Component Value Date   CEA 1.7 10/17/2013    Imaging:  As per history of present illness   Assessment/Plan:   1. Moderately differentiated adenocarcinoma of the cecum, stage IIb (T4a, N0), status post a laparoscopic assisted right colectomy 10/22/2013  The tumor returned microsatellite stable with normal mismatch repair protein expression 2. Right ovary cystadenofibroma, status post a  bilateral oophorectomy 10/22/2013 3. Remote history of cervical cancer 4. Iron deficiency anemia 5. Right calf deep vein thrombosis 10/24/2013-maintained on xarelto 6. Indeterminate 12 mm hypermetabolic right pelvic density on the staging PET scan 10/04/2013 7. Soft tissue implant overlying the posterior right liver on the CT 61/60/7371-GGY hypermetabolic on the PET scan 69/48/5462   Disposition:   Ms. Doubek has been diagnosed with adenocarcinoma of the cecum. I discussed the prognosis and adjuvant treatment options with her today. We reviewed the details of the surgical pathology report.  She has a good prognosis for a long-term disease-free survival. I estimated the chance of recurrent colon cancer over the next 5 years to be in the 20-30% range. We discussed the expected improvement in the cure rate with adjuvant 5-fluorouracil based chemotherapy. We specifically discussed capecitabine. We reviewed the potential toxicities associated with capecitabine including the chance for mucositis, diarrhea, hematologic toxicity, rash, hyperpigmentation, and the hand/foot syndrome.  She indicated that she does not wish to receive  adjuvant chemotherapy.  She does not appear to have hereditary non-polyposis colon cancer syndrome. She understands family members should receive screening for colon cancer.  I will present her case at the GI tumor conference 11/13/2013 to discuss the indication for followup imaging of the nodular area near the posterior right liver and the hypermetabolic pelvic nodule.  Ms. Kisling will return for an office visit and CEA in 6 months.  I recommend she continue anticoagulation therapy for a total of 6 months. She will followup with Dr. Stephanie Acre for management of anticoagulation and followup of the iron deficiency anemia.  Approximately 50 minutes were spent with patient today. The majority of the time was used for counseling and coordination of care.  [place],  [place] 11/11/2013, 2:19 PM

## 2013-11-13 ENCOUNTER — Encounter: Payer: Self-pay | Admitting: Nutrition

## 2013-11-15 ENCOUNTER — Telehealth: Payer: Self-pay

## 2013-11-15 DIAGNOSIS — I824Z9 Acute embolism and thrombosis of unspecified deep veins of unspecified distal lower extremity: Secondary | ICD-10-CM

## 2013-11-15 MED ORDER — RIVAROXABAN 20 MG PO TABS: 20.0000 mg | ORAL_TABLET | Freq: Every day | ORAL | Status: DC

## 2013-11-15 NOTE — Telephone Encounter (Signed)
Dr. Ammie Dalton told her that he was going to electronically send a prescription the day of her visit 11-11-13.  No prescription was sent. Told Cynthia [name] that a months supply of Xarelto 20 mg daily will be sent to [facility] on [address] with refills by Dr. Stephanie Acre.

## 2013-11-20 ENCOUNTER — Telehealth: Payer: Self-pay | Admitting: *Deleted

## 2013-11-20 NOTE — Telephone Encounter (Signed)
Called patient with appointment to see Dr. Benay Spice tomorrow at 3 pm to discuss pathology and recommendations for adjuvant therapy. She agrees to appointment.

## 2013-11-21 ENCOUNTER — Ambulatory Visit (HOSPITAL_BASED_OUTPATIENT_CLINIC_OR_DEPARTMENT_OTHER): Payer: 59 | Admitting: Oncology

## 2013-11-21 VITALS — BP 151/80 | HR 82 | Temp 97.6°F | Resp 18 | Ht 61.0 in | Wt 131.7 lb

## 2013-11-21 DIAGNOSIS — C182 Malignant neoplasm of ascending colon: Secondary | ICD-10-CM

## 2013-11-21 DIAGNOSIS — D509 Iron deficiency anemia, unspecified: Secondary | ICD-10-CM

## 2013-11-21 DIAGNOSIS — I82409 Acute embolism and thrombosis of unspecified deep veins of unspecified lower extremity: Secondary | ICD-10-CM

## 2013-11-21 DIAGNOSIS — Z8541 Personal history of malignant neoplasm of cervix uteri: Secondary | ICD-10-CM

## 2013-11-21 DIAGNOSIS — C180 Malignant neoplasm of cecum: Secondary | ICD-10-CM

## 2013-11-21 NOTE — Progress Notes (Signed)
  [place] OFFICE PROGRESS NOTE   Diagnosis: Colon cancer  INTERVAL HISTORY:   She returns for an unscheduled visit. Her case was presented at the GI tumor conference 11/13/2013. Additional reviewed the pathology confirmed a positive lymph node for metastatic carcinoma. We contacted [name] to come in for additional discussion.  She is taking iron and the hemoglobin is being followed by Dr. Stephanie Acre. She continues to have malaise. No other complaint.  Objective:  Vital signs in last 24 hours:  Blood pressure 151/80, pulse 82, temperature 97.6 F (36.4 C), temperature source Oral, resp. rate 18, height _0  (1.549 m), weight 131 lb 11.2 oz (59.739 kg).   Physical examination-not performed today  Lab Results:  Lab Results  Component Value Date   WBC 6.6 10/27/2013   HGB 10.0* 10/27/2013   HCT 31.2* 10/27/2013   MCV 76.3* 10/27/2013   PLT 325 10/27/2013   NEUTROABS 5.5 10/17/2013    Lab Results  Component Value Date   CEA 1.7 10/17/2013     Medications: I have reviewed the patient's current medications.  Assessment/Plan: 1. Moderately differentiated adenocarcinoma of the cecum, stage III (T4a, N1), status post a laparoscopic assisted right colectomy 10/22/2013 The tumor returned microsatellite stable with normal mismatch repair protein expression 2. Right ovary cystadenofibroma, status post a bilateral oophorectomy 10/22/2013 3. Remote history of cervical cancer 4. Iron deficiency anemia 5. Right calf deep vein thrombosis 10/24/2013-maintained on xarelto 6. Indeterminate 12 mm hypermetabolic right pelvic density on the staging PET scan 10/04/2013-CT followup recommended per the GI tumor conference 11/13/2013 7. Soft tissue implant overlying the posterior right liver on the CT 72/27/7375-GRJ hypermetabolic on the PET scan 02/23/5246   Disposition:  She appears well. I discussed the positive lymph node with [name] and her daughter. The chance of  developing recurrent colon cancer is higher based on this finding. I recommend adjuvant capecitabine. We again reviewed the potential toxicities associated with capecitabine. She understands that if she develops recurrent colon cancer no therapy will be curative. We have multiple systemic agents available which offer the chance of partial clinical remission. We discussed the average survival for patients with metastatic colon cancer. Her initial decision is again adjuvant chemotherapy. She agreed to reviewed reading materials on capecitabine and contact us with her decision on adjuvant therapy 11/25/2013.  An indeterminate 12 mm right pelvic density was noted on the staging PET scan 10/04/2013. The conference radiologist and others present recommend a followup surveillance CT at a 3 to four-month interval. We will contact [name] with this recommendation.  She will return as scheduled for an office visit and CEA in 6 months.  Ladell Pier, MD  11/21/2013  3:38 PM

## 2013-11-22 ENCOUNTER — Encounter: Payer: Self-pay | Admitting: Oncology

## 2013-11-22 NOTE — Progress Notes (Signed)
XARELTO 20 MG APPROVED UNTIL 12/27/13. [id number].  I SPOKE TO EVAN @ [place] [phone].

## 2013-11-25 ENCOUNTER — Encounter: Payer: Self-pay | Admitting: *Deleted

## 2013-11-25 ENCOUNTER — Ambulatory Visit: Payer: Medicare Other | Admitting: Nutrition

## 2013-11-25 NOTE — Progress Notes (Signed)
78 year old female diagnosed with colon cancer status post right colectomy.  Past medical history includes anemia, heart murmur, hypertension, liver cyst, remote cervical cancer.  Medications include, ferrous sulfate, and Xarelto.  Labs were reviewed.  Height: 61 inches. Weight: 130.7 pounds March 30. Usual body weight: 135 pounds. BMI: 24.71.  Patient is interested in diet education for anemia.  She has a poor appetite, but is trying to eat small amounts often.  She complains of malaise.  She reports she continues to heal from her surgery.  She is beginning to walk on a regular basis.  She has no other nutrition concerns.  Nutrition diagnosis: Food and nutrition related knowledge deficit related to anemia.  As evidenced by no prior need for nutrition related information.  Intervention: Patient educated on high iron foods, and strategies for increased iron absorption.  I reviewed high protein diet with patient.  Encouraged adequate calories to promote weight maintenance.  Provided fact sheets for patient.  Questions were answered.  Teach back method used.  Monitoring, evaluation, goals: Patient will tolerate healthy, plant-based diet with increased iron containing foods.  Next visit: No followup scheduled.  Patient has my contact information and agrees to call me with further questions or concerns.

## 2013-11-25 NOTE — [facility] Navigator Note (Signed)
Dr. Benay Spice spoke with patient  In hallway while here to visit dietician.  Patient stated she is waiting to see her primary care MD on 12/06/13 before she makes a treatment decision.  Dr. Benay Spice advised adjuvant treatment and patient verbalized understanding but wants to discuss things over with primary care doctor re: her anemia.  She stated she would contact Dr. Benay Spice after 12/06/13 to let him know her decision.  Dr. Benay Spice also informed pt of recommendation (per GI conference) for a follow-up CT scan of pelvis in 3-4 months to evaluate small pelvic nodule found on previous PET.  She verbalized understanding and was without questions.

## 2013-12-04 ENCOUNTER — Ambulatory Visit: Payer: 59 | Attending: Gynecologic Oncology | Admitting: Gynecologic Oncology

## 2013-12-04 ENCOUNTER — Encounter: Payer: Self-pay | Admitting: Gynecologic Oncology

## 2013-12-04 VITALS — BP 169/77 | Temp 98.1°F | Resp 21 | Ht 61.75 in | Wt 131.0 lb

## 2013-12-04 DIAGNOSIS — N83209 Unspecified ovarian cyst, unspecified side: Secondary | ICD-10-CM | POA: Diagnosis present

## 2013-12-04 NOTE — Progress Notes (Signed)
Consult Note: Gyn-Onc  Cynthia [name] 78 y.o. female  CC:  Chief Complaint  Patient presents with  . Ovarian Cyst    HPI: Cynthia [name] is a 78 y.o. G4P4 who was referred to me today by Dr. Fanny Skates for a large right adnexal mass. She was referred to Dr. Dalbert Batman by  Dr. Wilford Corner for evaluation and management of symptomatic adenocarcinoma of the cecum. Her primary care physician Dr. Jonathon Jordan.   This patient has enjoyed good health. She doesn't see doctors much but does get an annual physical. She's never had a colonoscopy. She had a near syncopal episode at church went to the ED and found to be anemic. She was transfused one unit of blood. She was placed on iron, which turned her stools black.  On January 22 her hemoglobin was 7.8.   A CT scan was performed on September 20, 2013 shows a 5 cm cecal mass extending into the terminal ileum compatible with a carcinoma. There were suspected extension and the pericolonic fat with adjacent small lymph nodes age and multiple benign-appearing liver cyst. There was a 11 x 16 mm soft tissue implant overlying the posterior right hepatic lobe possibly reflecting a peritoneal nodule.There is a large right adnexal cyst. Status post hysterectomy. 7.0 x 5.8 cm right ovarian cyst  without malignant features on CT.  Dr. Michail Sermon performed a colonoscopy shortly thereafter and found a large fungating ulcerated partially obstructing mass in the cecum which was circumferential. There was no blood. He was able to visualize the ileocecal valve. Biopsy shows adenocarcinoma.  PET/CT 10/04/13 CHEST  No hypermetabolic mediastinal or hilar nodes. No suspicious pulmonary nodules on the CT scan. A nodular density adjacent to the major fissure on the left on image number 66 is likely an intrapulmonary lymph node.  ABDOMEN/PELVIS  Large right cecal mass as demonstrated on the prior CT scan demonstrating marked FDG uptake with SUV max of 30.0. I do not see  any definite hypermetabolic pericecal lymph nodes. No hypermetabolic liver lesions to suggest liver metastasis. There are multiple benign appearing hepatic cysts. No retroperitoneal adenopathy. The 10 mm lymph node anterior to the right psoas muscle does not show any FDG uptake. There is also a small cluster of adjacent nodes which are not definitely a metabolically active. An 8 mm lymph node in the right lower pelvis on image number 159 does not show any hypermetabolism.  As demonstrated on the CT scan there is a large cyst associated with the right ovary. Hypermetabolism adjacent to this area laterally is probably the right ureter. Small irregular density in the right lower pelvis on image number 170 measuring approximately 12 mm does show hypermetabolism (SUV max 6.0) but would be an unusual drainage pattern for cecal cancer. Attention to this area on future studies is suggested.  SKELETON  No focal hypermetabolic activity to suggest skeletal metastasis.  IMPRESSION:  1. Large right cecal mass consistent with neoplasm with SUV max of 30.0  2. No findings to suggest hepatic metastatic disease.  3. Pericecal lymph nodes do not demonstrate any definite FDG uptake but are still worrisome for metastatic disease. A hypermetabolic lesion in the right lower pelvis needs close attention.  On March 10 she underwent a laparoscopic hand-assisted colectomy with Dr. Dalbert Batman. At that time we performed a bilateral salpingo-oophorectomy. When I arrived in the operating room Dr. Dalbert Batman had completed a right hemicolectomy. There were two 5 mm laparoscopic ports and a vertical midline incision measuring approximately 7 cm. We  were able to use that small incision to perform her surgery and not add any additional port sites. Pathology from our perspective was benign. She comes in today for brief postoperative check. She has been seen by Dr. Dalbert Batman postoperatively as well as Dr. Benay Spice She did have a DVT in deep Vein  thrombosis of the right leg postoperatively and was placed on xarelto.  1. Lymph node, biopsy, highest right colic node - ONE LYMPH NODE, NEGATIVE FOR METASTATIC CARCINOMA (0/1). 2. Colon, segmental resection for tumor, right colon - INVASIVE MODERATELY DIFFERENTIATED ADENOCARCINOMA, INVADING THROUGH THE MUSCULARIS PROPRIA, INVOLVING THE SEROSA, EXTENDING TO THE SMALL BOWEL MUCOSA. - NO ANGIOLYMPHATIC INVASION IDENTIFIED. - ONE OF FIFTEEN LYMPH NODES, POSITIVE FOR METASTATIC CARCINOMA (1/15). - RESECTION MARGINS, NEGATIVE FOR ATYPIA OR MALIGNANCY. 3. Ovary and fallopian tube, right - BENIGN SEROUS CYSTADENOFIBROMA, NO ATYPIA OR MALIGNANCY. 4. Ovary and fallopian tube, left - BENIGN OVARIAN TISSUE WITH BENIGN SEROUS CYST AND ENDOSALPINGIOSIS, NO ATYPIA OR MALIGNANCY. - BENIGN FIBROADIPOSE TISSUE.  She comes in accompanied by her daughter today for her postoperative check. She is very tearful and upset at times during the interview. She had many questions regarding why her PET scan was negative preoperatively they found metastatic cancer lymph node. While I cannot tell the size of the metastatic focus within the lymph node, I told her that it was a microscopic focus in may be below the level of detection of the PET scan and that does occasionally unfortunately does happen. I told her that no imaging study would be perfect and that fortunately the pathologists identified the metastatic focus. She had other questions and concerns regarding the chemotherapy and her body's ability to tolerate it. I discussed that she could always attempt taking the capecitabine for a week or so and depending on the side effects she could discontinue it or continue it. She is seeing her primary care physician later this week and will ultimately make a decision. She seems to be a bit overwhelmed about her pathology report and the need for additional therapy at this time.  Current Meds:  Outpatient Encounter Prescriptions  as of 12/04/2013  Medication Sig  . ferrous sulfate 325 (65 FE) MG tablet Take 325 mg by mouth 2 (two) times daily.  . Rivaroxaban (XARELTO) 20 MG TABS tablet Take 1 tablet (20 mg total) by mouth daily with supper.  Marland Kitchen oxyCODONE-acetaminophen (PERCOCET/ROXICET) 5-325 MG per tablet     Allergy: No Known Allergies  Social Hx:   History   Social History  . Marital Status: Widowed    Spouse Name: N/A    Number of Children: N/A  . Years of Education: N/A   Occupational History  . Not on file.   Social History Main Topics  . Smoking status: Never Smoker   . Smokeless tobacco: Never Used  . Alcohol Use: No  . Drug Use: No  . Sexual Activity: Not on file   Other Topics Concern  . Not on file   Social History Narrative  . No narrative on file    Past Surgical Hx: Vaginal hysterectomy 40 years ago for cervical dysplasia Past Surgical History  Procedure Laterality Date  . Abdominal hysterectomy      vaginal- partial  . Back surgery  1981    lower lumbar  . Tubal ligation  1968  . Laparoscopic right colectomy Right 10/22/2013    Procedure: LAPAROSCOPIC RIGHT COLECTOMY ;  Surgeon: [name], MD;  Location: [facility];  Service: General;  Laterality: Right;  . Salpingoophorectomy  10/22/2013    Procedure: Lesly Dukes OOPHORECTOMY;  Surgeon: Imagene Gurney A. [name], MD;  Location: [facility];  Service: Gynecology;;    Past Medical Hx:  Past Medical History  Diagnosis Date  . Cancer     cervical ca  . Anemia   . Blood transfusion without reported diagnosis jan 2015  . Colon cancer   . Heart murmur   . Hypertension     no bp meds   . Liver cyst   . Complication of anesthesia     slow to awaken in past    Oncology Hx:   No history exists.    Family Hx:  Family History  Problem Relation Age of Onset  . Diabetes Sister   . Cancer Sister     leukemia  . Diabetes Brother   . Heart failure Brother   . Hypertension Brother   . Breast cancer Maternal Aunt     Vitals:   Blood pressure 169/77, temperature 98.1 F (36.7 C), temperature source Oral, resp. rate 21, height 5' 1.75" (1.568 m), weight 131 lb (59.421 kg). weight 133 pounds, height 5 foot 1, temperature 38.7, blood pressure 148/83, pulse 92, respirations 16  Physical Exam: Well-nourished well-developed female in no acute distress.  Abdomen: Well-healed vertical infraumbilical midline incision. Soft, nontender, nondistended.  Extremities: No edema.  Assessment/Plan: 78 year old with T4 colon cancer at the time of her pre-op imaging, she was found to have a large right adnexal mass. Her ovaries showed benign pathology. She's doing well from a GYN oncology perspective. She has followup scheduled with her medical oncologist, Dr. Benay Spice. She will return to see [place] prn.   Jerred Zaremba A. [name], MD 12/04/2013, 12:08 PM

## 2013-12-04 NOTE — Patient Instructions (Signed)
Please call for any questions or concerns. 

## 2013-12-09 ENCOUNTER — Telehealth: Payer: Self-pay | Admitting: *Deleted

## 2013-12-09 ENCOUNTER — Telehealth: Payer: Self-pay | Admitting: Oncology

## 2013-12-09 ENCOUNTER — Other Ambulatory Visit: Payer: Self-pay | Admitting: *Deleted

## 2013-12-09 NOTE — Telephone Encounter (Signed)
S/w the pt and she is aware of her appt on 12/11/2013.

## 2013-12-09 NOTE — Telephone Encounter (Signed)
Received phone call from patient stating that she has decided to take Xeloda as recommended by Dr. Benay Spice.  Dr. Benay Spice informed and will be ordering the drug.  Patient is aware that she will be notified by drug company as to where the drug will be dispensed.  She is aware that she will be called with appointment to meet with MD and will be scheduled for a chemo class.

## 2013-12-11 ENCOUNTER — Encounter: Payer: Self-pay | Admitting: Oncology

## 2013-12-11 ENCOUNTER — Other Ambulatory Visit: Payer: Medicare Other

## 2013-12-11 ENCOUNTER — Ambulatory Visit (HOSPITAL_BASED_OUTPATIENT_CLINIC_OR_DEPARTMENT_OTHER): Payer: 59 | Admitting: Oncology

## 2013-12-11 ENCOUNTER — Telehealth: Payer: Self-pay | Admitting: Oncology

## 2013-12-11 ENCOUNTER — Other Ambulatory Visit: Payer: Self-pay | Admitting: *Deleted

## 2013-12-11 VITALS — BP 163/89 | HR 85 | Temp 97.8°F | Resp 19 | Ht 61.75 in | Wt 132.2 lb

## 2013-12-11 DIAGNOSIS — D509 Iron deficiency anemia, unspecified: Secondary | ICD-10-CM

## 2013-12-11 DIAGNOSIS — I82409 Acute embolism and thrombosis of unspecified deep veins of unspecified lower extremity: Secondary | ICD-10-CM

## 2013-12-11 DIAGNOSIS — Z8541 Personal history of malignant neoplasm of cervix uteri: Secondary | ICD-10-CM

## 2013-12-11 DIAGNOSIS — D279 Benign neoplasm of unspecified ovary: Secondary | ICD-10-CM

## 2013-12-11 DIAGNOSIS — C180 Malignant neoplasm of cecum: Secondary | ICD-10-CM

## 2013-12-11 DIAGNOSIS — C189 Malignant neoplasm of colon, unspecified: Secondary | ICD-10-CM

## 2013-12-11 MED ORDER — CAPECITABINE 500 MG PO TABS: 1500.0000 mg | ORAL_TABLET | Freq: Two times a day (BID) | ORAL | Status: DC

## 2013-12-11 MED ORDER — PROCHLORPERAZINE MALEATE 5 MG PO TABS: 5.0000 mg | ORAL_TABLET | Freq: Four times a day (QID) | ORAL | Status: DC | PRN

## 2013-12-11 NOTE — Telephone Encounter (Signed)
Gave pt appt for lab and MD for May today

## 2013-12-11 NOTE — Progress Notes (Signed)
Faxed xeloda prescription to [facility], [phone] phone # [phone]

## 2013-12-11 NOTE — Telephone Encounter (Signed)
, °

## 2013-12-11 NOTE — Progress Notes (Signed)
  [place] OFFICE PROGRESS NOTE   Diagnosis: Colon cancer  INTERVAL HISTORY:   Cynthia [name] has decided to proceed with adjuvant capecitabine. She has reviewed reading materials on capecitabine. She feels well. She continues iron and xarelto.  Objective:  Vital signs in last 24 hours:  Blood pressure 163/89, pulse 85, temperature 97.8 F (36.6 C), temperature source Oral, resp. rate 19, height 5' 1.75" (1.568 m), weight 132 lb 3.2 oz (59.966 kg).   Resp: Lungs clear bilaterally Cardio: Regular rate and rhythm GI: Nontender, no hepatomegaly, healed midline incision Vascular: Trace edema at the right lower leg   Lab Results:  Lab Results  Component Value Date   WBC 6.6 10/27/2013   HGB 10.0* 10/27/2013   HCT 31.2* 10/27/2013   MCV 76.3* 10/27/2013   PLT 325 10/27/2013   NEUTROABS 5.5 10/17/2013    Lab Results  Component Value Date   CEA 1.7 10/17/2013    Imaging:  No results found.  Medications: I have reviewed the patient's current medications.  Assessment/Plan: 1. Moderately differentiated adenocarcinoma of the cecum, stage III (T4a, N1), status post a laparoscopic assisted right colectomy 10/22/2013 The tumor returned microsatellite stable with normal mismatch repair protein expression 2. Right ovary cystadenofibroma, status post a bilateral oophorectomy 10/22/2013 3. Remote history of cervical cancer 4. Iron deficiency anemia 5. Right calf deep vein thrombosis 10/24/2013-maintained on xarelto 6. Indeterminate 12 mm hypermetabolic right pelvic density on the staging PET scan 10/04/2013-CT followup recommended per the GI tumor conference 11/13/2013 7. Soft tissue implant overlying the posterior right liver on the CT 79/15/0413-SCB hypermetabolic on the PET scan 83/77/9396   Disposition:  She has recovered from surgery. Cynthia [name] has decided to proceed with adjuvant capecitabine. We again reviewed the potential toxicities associated with  capecitabine including the chance for mucositis, diarrhea, hematologic toxicity, rash, hyperpigmentation, and the hand/foot syndrome. She will attend a chemotherapy teaching class today. The plan is to begin a first cycle of adjuvant capecitabine 12/16/2013.  Cynthia [name] will return for an office and lab visit 01/02/2014.  Cynthia Pier, MD  12/11/2013  11:38 AM

## 2013-12-17 ENCOUNTER — Ambulatory Visit (INDEPENDENT_AMBULATORY_CARE_PROVIDER_SITE_OTHER): Payer: Medicare Other | Admitting: General Surgery

## 2013-12-17 ENCOUNTER — Telehealth: Payer: Self-pay | Admitting: *Deleted

## 2013-12-17 ENCOUNTER — Encounter (INDEPENDENT_AMBULATORY_CARE_PROVIDER_SITE_OTHER): Payer: Self-pay | Admitting: General Surgery

## 2013-12-17 VITALS — BP 154/82 | HR 88 | Temp 98.5°F | Resp 16 | Ht 61.75 in | Wt 132.0 lb

## 2013-12-17 DIAGNOSIS — D509 Iron deficiency anemia, unspecified: Secondary | ICD-10-CM

## 2013-12-17 DIAGNOSIS — I824Z9 Acute embolism and thrombosis of unspecified deep veins of unspecified distal lower extremity: Secondary | ICD-10-CM

## 2013-12-17 DIAGNOSIS — C182 Malignant neoplasm of ascending colon: Secondary | ICD-10-CM

## 2013-12-17 NOTE — Patient Instructions (Signed)
You have healed all of your surgical wounds very well.  You may resume normal physical activities without restriction  Discuss the duration of the iron and his xarelto with Dr. Benay Spice  Drink lots of fluids and eat lots of fruits and vegetables.  You should get a colonoscopy in one year  Return to see Dr. Dalbert Batman in June, 2016

## 2013-12-17 NOTE — Progress Notes (Signed)
Patient ID: Cynthia [name], female   DOB: 1935-10-28, 78 y.o.   MRN: [id number] History:  This patient underwent laparoscopic-assisted right colectomy And BSO on 10/22/2013. While in the hospital she had some right thigh pain. We did a duplex scan, and surprisingly, we found a DVT in a deep calf vein. She is on Xarelto at this time. The pain has resolved and she feels very good.  Appetite is improving. Bowel movements are basically normal, usually solid.   No abdominal pain or nausea. No wound problems.  Her pathology report showed a large cancer in the right colon, stage T4a,., N0. Tubes and ovaries were benign.She has seen Dr. Julieanne Manson and has decided to go on adjuvant cepacitabine.  I've asked her to discuss the duration of the iron and xarelto with him.   Exam:  Patient looks very good. Color good.  Her daughter is with her. No distress  Abdomen soft. Flat. Nontender. Wounds looked good. Well healed. No hernia.  Assessment:  Locally advanced cancer of the right colon, pathologic stage T4a,., N0. Recovering uneventfully following laparoscopic right colectomy and BSO.  DVT right calf vein. Asymptomatic on xarelto  Iron deficiency anemia, improving on iron, being followed by Dr. Stephanie Acre.   Plan:   Dr. Benay Spice plans to initiate adjuvant  Cepacitabine.  Anticipate that he will draw blood work periodically and obtain followup CT scan at the one year mark.  Recommend colonoscopy in one year. Hydration, fruits and vegetables emphasized.  Resume normal physical activity without restriction I asked the patient and her daughter to discuss the duration of the xarelto with Dr. Benay Spice. I told them it should be at least 3 months, but no more than 6 months total. Iron deficiency anemia is being managed and monitored by Dr. Neena Rhymes M. Dalbert Batman, M.D., [facility]  General and Minimally invasive Surgery  Breast and Colorectal Surgery  Office: [phone]   Pager: [phone]

## 2013-12-17 NOTE — Telephone Encounter (Signed)
Has not heard from pharmacy yet-was supposed to start Xeloda cycle on 12/16/13.

## 2013-12-17 NOTE — Telephone Encounter (Signed)
Confirmed with pharmacy, [facility] at [phone] that order still in process. Made them aware it was to be started yesterday-script faxed on 12/11/13. They will contact her today and get it delivered by 12/18/13. Called [facility] and made her aware of call and gave her phone # to follow up this afternoon if she has not heard from them.

## 2013-12-20 ENCOUNTER — Telehealth: Payer: Self-pay | Admitting: *Deleted

## 2013-12-20 ENCOUNTER — Other Ambulatory Visit: Payer: Self-pay | Admitting: Oncology

## 2013-12-20 NOTE — Telephone Encounter (Signed)
Message from pt reporting she has not heard from pharmacy yet. Called [facility], prescription was approved 3 days ago. They were waiting for pt to contact them again. Prescription was still under review the first time she called. Requested pharmacy contact pt to set up delivery of med. Left message on voicemail for pt informing her of above.

## 2013-12-23 NOTE — Telephone Encounter (Signed)
Too early, not due until after followup on 01/02/14

## 2013-12-24 ENCOUNTER — Telehealth: Payer: Self-pay | Admitting: *Deleted

## 2013-12-24 NOTE — Telephone Encounter (Signed)
Message from pt reporting Xeloda arrived today. Plans to start 12/25/13 with breakfast. Order sent to scheduler to move office visit appt to the following week.

## 2013-12-28 ENCOUNTER — Telehealth: Payer: Self-pay | Admitting: Oncology

## 2013-12-28 NOTE — Telephone Encounter (Signed)
lvm for pt regarding to May appt....amiled pt avs and letter

## 2014-01-02 ENCOUNTER — Other Ambulatory Visit: Payer: 59

## 2014-01-02 ENCOUNTER — Ambulatory Visit: Payer: 59 | Admitting: Oncology

## 2014-01-07 ENCOUNTER — Other Ambulatory Visit: Payer: Self-pay | Admitting: Oncology

## 2014-01-07 ENCOUNTER — Ambulatory Visit (HOSPITAL_BASED_OUTPATIENT_CLINIC_OR_DEPARTMENT_OTHER): Payer: 59 | Admitting: Oncology

## 2014-01-07 ENCOUNTER — Other Ambulatory Visit (HOSPITAL_BASED_OUTPATIENT_CLINIC_OR_DEPARTMENT_OTHER): Payer: 59

## 2014-01-07 ENCOUNTER — Telehealth: Payer: Self-pay | Admitting: Oncology

## 2014-01-07 VITALS — BP 162/84 | HR 73 | Temp 98.2°F | Resp 18 | Ht 61.0 in | Wt 133.3 lb

## 2014-01-07 DIAGNOSIS — C182 Malignant neoplasm of ascending colon: Secondary | ICD-10-CM

## 2014-01-07 DIAGNOSIS — M799 Soft tissue disorder, unspecified: Secondary | ICD-10-CM

## 2014-01-07 DIAGNOSIS — C180 Malignant neoplasm of cecum: Secondary | ICD-10-CM

## 2014-01-07 DIAGNOSIS — N949 Unspecified condition associated with female genital organs and menstrual cycle: Secondary | ICD-10-CM

## 2014-01-07 DIAGNOSIS — I824Z9 Acute embolism and thrombosis of unspecified deep veins of unspecified distal lower extremity: Secondary | ICD-10-CM

## 2014-01-07 DIAGNOSIS — C189 Malignant neoplasm of colon, unspecified: Secondary | ICD-10-CM

## 2014-01-07 DIAGNOSIS — D279 Benign neoplasm of unspecified ovary: Secondary | ICD-10-CM

## 2014-01-07 LAB — COMPREHENSIVE METABOLIC PANEL (CC13)
ALK PHOS: 35 U/L — AB (ref 40–150)
ALT: 13 U/L (ref 0–55)
ANION GAP: 11 meq/L (ref 3–11)
AST: 21 U/L (ref 5–34)
Albumin: 3.8 g/dL (ref 3.5–5.0)
BILIRUBIN TOTAL: 0.51 mg/dL (ref 0.20–1.20)
BUN: 12.4 mg/dL (ref 7.0–26.0)
CO2: 25 meq/L (ref 22–29)
CREATININE: 0.8 mg/dL (ref 0.6–1.1)
Calcium: 9.4 mg/dL (ref 8.4–10.4)
Chloride: 107 mEq/L (ref 98–109)
GLUCOSE: 96 mg/dL (ref 70–140)
Potassium: 4.5 mEq/L (ref 3.5–5.1)
Sodium: 143 mEq/L (ref 136–145)
Total Protein: 6.7 g/dL (ref 6.4–8.3)

## 2014-01-07 LAB — CBC WITH DIFFERENTIAL/PLATELET
BASO%: 0.5 % (ref 0.0–2.0)
Basophils Absolute: 0 10*3/uL (ref 0.0–0.1)
EOS%: 1.7 % (ref 0.0–7.0)
Eosinophils Absolute: 0.1 10*3/uL (ref 0.0–0.5)
HEMATOCRIT: 38.1 % (ref 34.8–46.6)
HGB: 12.2 g/dL (ref 11.6–15.9)
LYMPH%: 32.5 % (ref 14.0–49.7)
MCH: 27.6 pg (ref 25.1–34.0)
MCHC: 31.9 g/dL (ref 31.5–36.0)
MCV: 86.6 fL (ref 79.5–101.0)
MONO#: 0.4 10*3/uL (ref 0.1–0.9)
MONO%: 7.6 % (ref 0.0–14.0)
NEUT#: 2.7 10*3/uL (ref 1.5–6.5)
NEUT%: 57.7 % (ref 38.4–76.8)
Platelets: 247 10*3/uL (ref 145–400)
RBC: 4.4 10*6/uL (ref 3.70–5.45)
RDW: 15.6 % — ABNORMAL HIGH (ref 11.2–14.5)
WBC: 4.6 10*3/uL (ref 3.9–10.3)
lymph#: 1.5 10*3/uL (ref 0.9–3.3)

## 2014-01-07 NOTE — Progress Notes (Signed)
  [place] OFFICE PROGRESS NOTE   Diagnosis: Colon cancer  INTERVAL HISTORY:   She returns as scheduled. She began a first cycle of adjuvant Xeloda 12/25/2013. No mouth sores, diarrhea, or hand/foot pain. No bleeding. She continues anticoagulation.  Objective:  Vital signs in last 24 hours:  Blood pressure 162/84, pulse 73, temperature 98.2 F (36.8 C), temperature source Oral, resp. rate 18, height $RemoveBe'5\' 1"'cUOUjkTOy$  (1.549 m), weight 133 lb 4.8 oz (60.464 kg).    HEENT: No thrush or ulcers Resp: Lungs clear bilaterally Cardio: Regular rate and rhythm GI: No hepatomegaly, nontender Vascular: No leg edema  Skin: Palms without erythema    Lab Results:  Lab Results  Component Value Date   WBC 4.6 01/07/2014   HGB 12.2 01/07/2014   HCT 38.1 01/07/2014   MCV 86.6 01/07/2014   PLT 247 01/07/2014   NEUTROABS 2.7 01/07/2014    Medications: I have reviewed the patient's current medications.  Assessment/Plan: 1. Moderately differentiated adenocarcinoma of the cecum, stage III (T4a, N1), status post a laparoscopic assisted right colectomy 10/22/2013 The tumor returned microsatellite stable with normal mismatch repair protein expression Cycle 1 adjuvant Xeloda 12/25/2013 2. Right ovary cystadenofibroma, status post a bilateral oophorectomy 10/22/2013 3. Remote history of cervical cancer 4. Iron deficiency anemia-resolved 5. Right calf deep vein thrombosis 10/24/2013-maintained on xarelto 6. Indeterminate 12 mm hypermetabolic right pelvic density on the staging PET scan 10/04/2013-CT followup recommended per the GI tumor conference 11/13/2013 7. Soft tissue implant overlying the posterior right liver on the CT 01/13/5614-PPH hypermetabolic on the PET scan 43/27/6147   Disposition:  She will complete the first cycle of adjuvant Xeloda today. She appears to be tolerating the Xeloda well. He plans to begin cycle 2 01/15/2014. The iron deficiency anemia has resolved. She will  discontinue iron therapy. The plan is to continue xarelto anticoagulation until she returns for a followup office visit 01/29/2014.  Ladell Pier, MD  01/07/2014  12:13 PM

## 2014-01-07 NOTE — Telephone Encounter (Signed)
gv and printed appts cehd and avs for pt for June °

## 2014-01-09 ENCOUNTER — Ambulatory Visit
Admission: RE | Admit: 2014-01-09 | Discharge: 2014-01-09 | Disposition: A | Discharge status: Home / Self Care | Payer: Medicare Other | Source: Ambulatory Visit | Attending: Family Medicine | Admitting: Family Medicine

## 2014-01-09 DIAGNOSIS — R928 Other abnormal and inconclusive findings on diagnostic imaging of breast: Secondary | ICD-10-CM

## 2014-01-22 ENCOUNTER — Other Ambulatory Visit: Payer: Self-pay | Admitting: Oncology

## 2014-01-29 ENCOUNTER — Other Ambulatory Visit: Payer: Self-pay | Admitting: *Deleted

## 2014-01-29 ENCOUNTER — Ambulatory Visit (HOSPITAL_BASED_OUTPATIENT_CLINIC_OR_DEPARTMENT_OTHER): Payer: Medicare Other | Admitting: Nurse Practitioner

## 2014-01-29 ENCOUNTER — Other Ambulatory Visit (HOSPITAL_BASED_OUTPATIENT_CLINIC_OR_DEPARTMENT_OTHER): Payer: Medicare Other

## 2014-01-29 ENCOUNTER — Telehealth: Payer: Self-pay | Admitting: Oncology

## 2014-01-29 VITALS — BP 157/88 | HR 82 | Temp 98.1°F | Resp 18 | Ht 61.0 in | Wt 131.4 lb

## 2014-01-29 DIAGNOSIS — C189 Malignant neoplasm of colon, unspecified: Secondary | ICD-10-CM

## 2014-01-29 DIAGNOSIS — R21 Rash and other nonspecific skin eruption: Secondary | ICD-10-CM

## 2014-01-29 DIAGNOSIS — C180 Malignant neoplasm of cecum: Secondary | ICD-10-CM

## 2014-01-29 DIAGNOSIS — L539 Erythematous condition, unspecified: Secondary | ICD-10-CM

## 2014-01-29 DIAGNOSIS — I82409 Acute embolism and thrombosis of unspecified deep veins of unspecified lower extremity: Secondary | ICD-10-CM

## 2014-01-29 DIAGNOSIS — C182 Malignant neoplasm of ascending colon: Secondary | ICD-10-CM

## 2014-01-29 DIAGNOSIS — Z8541 Personal history of malignant neoplasm of cervix uteri: Secondary | ICD-10-CM

## 2014-01-29 LAB — CBC WITH DIFFERENTIAL/PLATELET
BASO%: 1 % (ref 0.0–2.0)
BASOS ABS: 0 10*3/uL (ref 0.0–0.1)
EOS%: 6.2 % (ref 0.0–7.0)
Eosinophils Absolute: 0.3 10*3/uL (ref 0.0–0.5)
HCT: 37.8 % (ref 34.8–46.6)
HEMOGLOBIN: 12.2 g/dL (ref 11.6–15.9)
LYMPH#: 1.5 10*3/uL (ref 0.9–3.3)
LYMPH%: 30.7 % (ref 14.0–49.7)
MCH: 28.5 pg (ref 25.1–34.0)
MCHC: 32.2 g/dL (ref 31.5–36.0)
MCV: 88.5 fL (ref 79.5–101.0)
MONO#: 0.4 10*3/uL (ref 0.1–0.9)
MONO%: 9.1 % (ref 0.0–14.0)
NEUT#: 2.5 10*3/uL (ref 1.5–6.5)
NEUT%: 53 % (ref 38.4–76.8)
PLATELETS: 243 10*3/uL (ref 145–400)
RBC: 4.27 10*6/uL (ref 3.70–5.45)
RDW: 15.8 % — ABNORMAL HIGH (ref 11.2–14.5)
WBC: 4.8 10*3/uL (ref 3.9–10.3)

## 2014-01-29 MED ORDER — CAPECITABINE 500 MG PO TABS: ORAL_TABLET | ORAL | Status: DC

## 2014-01-29 NOTE — Telephone Encounter (Signed)
gv and printed appt sched and avs fo rpt for July °

## 2014-01-29 NOTE — Progress Notes (Signed)
  [place] OFFICE PROGRESS NOTE   Diagnosis:  Colon cancer.  INTERVAL HISTORY:   Cynthia [name] returns as scheduled. She completed cycle 2 Xeloda beginning 01/15/2014. She denies nausea/vomiting. No mouth sores. No diarrhea. No hand or foot pain or redness. She denies abdominal pain. She has a good appetite. No bleeding. She has noted a pruritic rash at the right upper back for several weeks.  Objective:  Vital signs in last 24 hours:  Blood pressure 157/88, pulse 82, temperature 98.1 F (36.7 C), temperature source Oral, resp. rate 18, height $RemoveBe'5\' 1"'JdJoYkMPS$  (1.549 m), weight 131 lb 6.4 oz (59.603 kg), SpO2 98.00%.    HEENT: No thrush or ulcerations. Resp: Lungs clear. Cardio: Regular cardiac rhythm. GI: Abdomen soft and nontender. No hepatomegaly. Vascular: No leg edema. Skin: Palms and soles with mild erythema. Approximate 2 cm dry appearing rash at the right upper back, does not appear vesicular.    Lab Results:  Lab Results  Component Value Date   WBC 4.8 01/29/2014   HGB 12.2 01/29/2014   HCT 37.8 01/29/2014   MCV 88.5 01/29/2014   PLT 243 01/29/2014   NEUTROABS 2.5 01/29/2014    Imaging:  No results found.  Medications: I have reviewed the patient's current medications.  Assessment/Plan: 1. Moderately differentiated adenocarcinoma of the cecum, stage III (T4a, N1), status post a laparoscopic assisted right colectomy 10/22/2013 The tumor returned microsatellite stable with normal mismatch repair protein expression  Cycle 1 adjuvant Xeloda 12/25/2013. Cycle 2 adjuvant Xeloda 01/15/2014. 2. Right ovary cystadenofibroma, status post a bilateral oophorectomy 10/22/2013 3. Remote history of cervical cancer. 4. Iron deficiency anemia-resolved. 5. Right calf deep vein thrombosis 10/24/2013-maintained on xarelto. 6. Indeterminate 12 mm hypermetabolic right pelvic density on the staging PET scan 10/04/2013-CT followup recommended per the GI tumor conference  11/13/2013. 7. Soft tissue implant overlying the posterior right liver on the CT 15/61/5379-KFE hypermetabolic on the PET scan 76/14/7092.   Disposition: She appears stable. She has completed 2 cycles of adjuvant Xeloda. Plan to proceed with cycle 3 as scheduled on 02/05/2014.  She has completed 3 months of anticoagulation for the right leg DVT. She will discontinued Xarelto.  She will return for a followup visit on 02/20/2014. She will contact the office in the interim with any problems. We specifically discussed progressive symptoms of hand-foot syndrome.  Plan reviewed with Dr. Benay Spice.    Ned Card ANP/GNP-BC   01/29/2014  10:58 AM

## 2014-02-13 ENCOUNTER — Telehealth: Payer: Self-pay | Admitting: *Deleted

## 2014-02-13 NOTE — Telephone Encounter (Signed)
Called back to follow up on diarrhea. Has been having one loose/almost liquid stool per day despite what she eats. Has not taken anything yet. Told her she can take OTC Imodium if she wants to thicken up her stool some. May take up to 6/day if necessary. If continues to have liquid stools 4-5 times/day, then hold her Xeloda and call office.

## 2014-02-13 NOTE — Telephone Encounter (Signed)
Left VM that she is having diarrhea. Asking what to do?

## 2014-02-20 ENCOUNTER — Telehealth: Payer: Self-pay | Admitting: Oncology

## 2014-02-20 ENCOUNTER — Other Ambulatory Visit (HOSPITAL_BASED_OUTPATIENT_CLINIC_OR_DEPARTMENT_OTHER): Payer: Medicare Other

## 2014-02-20 ENCOUNTER — Ambulatory Visit (HOSPITAL_BASED_OUTPATIENT_CLINIC_OR_DEPARTMENT_OTHER): Payer: Medicare Other | Admitting: Oncology

## 2014-02-20 VITALS — BP 134/87 | HR 93 | Temp 97.1°F | Resp 18 | Ht 61.0 in | Wt 131.5 lb

## 2014-02-20 DIAGNOSIS — C180 Malignant neoplasm of cecum: Secondary | ICD-10-CM

## 2014-02-20 DIAGNOSIS — C189 Malignant neoplasm of colon, unspecified: Secondary | ICD-10-CM

## 2014-02-20 DIAGNOSIS — R197 Diarrhea, unspecified: Secondary | ICD-10-CM

## 2014-02-20 DIAGNOSIS — Z8541 Personal history of malignant neoplasm of cervix uteri: Secondary | ICD-10-CM

## 2014-02-20 DIAGNOSIS — Z86718 Personal history of other venous thrombosis and embolism: Secondary | ICD-10-CM

## 2014-02-20 DIAGNOSIS — C182 Malignant neoplasm of ascending colon: Secondary | ICD-10-CM

## 2014-02-20 LAB — CBC WITH DIFFERENTIAL/PLATELET
BASO%: 0.3 % (ref 0.0–2.0)
BASOS ABS: 0 10*3/uL (ref 0.0–0.1)
EOS%: 0.9 % (ref 0.0–7.0)
Eosinophils Absolute: 0.1 10*3/uL (ref 0.0–0.5)
HEMATOCRIT: 37.4 % (ref 34.8–46.6)
HEMOGLOBIN: 12.4 g/dL (ref 11.6–15.9)
LYMPH%: 14.3 % (ref 14.0–49.7)
MCH: 30.5 pg (ref 25.1–34.0)
MCHC: 33.1 g/dL (ref 31.5–36.0)
MCV: 92.2 fL (ref 79.5–101.0)
MONO#: 0.8 10*3/uL (ref 0.1–0.9)
MONO%: 11.6 % (ref 0.0–14.0)
NEUT#: 5.3 10*3/uL (ref 1.5–6.5)
NEUT%: 72.9 % (ref 38.4–76.8)
PLATELETS: 236 10*3/uL (ref 145–400)
RBC: 4.05 10*6/uL (ref 3.70–5.45)
RDW: 18.9 % — ABNORMAL HIGH (ref 11.2–14.5)
WBC: 7.3 10*3/uL (ref 3.9–10.3)
lymph#: 1 10*3/uL (ref 0.9–3.3)

## 2014-02-20 LAB — COMPREHENSIVE METABOLIC PANEL (CC13)
ALT: 10 U/L (ref 0–55)
AST: 14 U/L (ref 5–34)
Albumin: 3.4 g/dL — ABNORMAL LOW (ref 3.5–5.0)
Alkaline Phosphatase: 50 U/L (ref 40–150)
Anion Gap: 7 mEq/L (ref 3–11)
BILIRUBIN TOTAL: 0.65 mg/dL (ref 0.20–1.20)
BUN: 13.3 mg/dL (ref 7.0–26.0)
CALCIUM: 8.9 mg/dL (ref 8.4–10.4)
CHLORIDE: 105 meq/L (ref 98–109)
CO2: 26 mEq/L (ref 22–29)
CREATININE: 0.8 mg/dL (ref 0.6–1.1)
Glucose: 123 mg/dl (ref 70–140)
Potassium: 4.1 mEq/L (ref 3.5–5.1)
Sodium: 138 mEq/L (ref 136–145)
Total Protein: 6.4 g/dL (ref 6.4–8.3)

## 2014-02-20 NOTE — Telephone Encounter (Signed)
gv adn printed appt scehd and avs fo rpt for July

## 2014-02-20 NOTE — Progress Notes (Signed)
  [place] OFFICE PROGRESS NOTE   Diagnosis: Colon cancer  INTERVAL HISTORY:   She returns as scheduled. She began a third cycle of Xeloda 02/05/2014. No mouth sores or hand/foot pain. She developed diarrhea yesterday (series 3 episodes) and reports 1 episode of diarrhea today. She complains of malaise and anorexia beginning on 02/16/2014. No fever. She has a chronic cold feeling. Her toes turn "purple "in the cold. No foot pain.  Objective:  Vital signs in last 24 hours:  Blood pressure 134/87, pulse 93, temperature 97.1 F (36.2 C), temperature source Oral, resp. rate 18, height $RemoveBe'5\' 1"'hfvcfcrBt$  (1.549 m), weight 131 lb 8 oz (59.648 kg).    HEENT: The mucous membranes are moist, no thrush or ulcers Resp: Lungs clear bilaterally Cardio: Regular rate and rhythm GI: No hepatomegaly, no mass, mild tenderness in the right lower quadrant, active bowel sounds Vascular: No leg edema , good pulses in the feet Skin: Mild purplish discoloration at the fingers and toes. Mild erythema over the soles. No skin thickening or breakdown.     Lab Results:  Lab Results  Component Value Date   WBC 7.3 02/20/2014   HGB 12.4 02/20/2014   HCT 37.4 02/20/2014   MCV 92.2 02/20/2014   PLT 236 02/20/2014   NEUTROABS 5.3 02/20/2014   Potassium 4.1, creatinine 0.8, BUN 13.3  Medications: I have reviewed the patient's current medications.  Assessment/Plan: 1. Moderately differentiated adenocarcinoma of the cecum, stage III (T4a, N1), status post a laparoscopic assisted right colectomy 10/22/2013 The tumor returned microsatellite stable with normal mismatch repair protein expression  Cycle 1 adjuvant Xeloda 12/25/2013.  Cycle 2 adjuvant Xeloda 01/15/2014. Cycle 3 adjuvant Xeloda 02/05/2014 2. Right ovary cystadenofibroma, status post a bilateral oophorectomy 10/22/2013 3. Remote history of cervical cancer. 4. Iron deficiency anemia-resolved. 5. Right calf deep vein thrombosis 10/24/2013-she completed  3 months of xarelto 6. Indeterminate 12 mm hypermetabolic right pelvic density on the staging PET scan 10/04/2013-CT followup recommended per the GI tumor conference 11/13/2013. 7. Soft tissue implant overlying the posterior right liver on the CT 71/21/9758-ITG hypermetabolic on the PET scan 54/98/2641. 8. Diarrhea-likely secondary to Xeloda   Disposition:  She has completed 3 cycles of adjuvant Xeloda. She is scheduled to begin cycle 4 on 02/26/2014. I encouraged her to push fluids and use Imodium. She will contact us on 02/24/2014 with an update on her condition. She will not begin the next cycle of Xeloda if the diarrhea has not resolved.  The purplish discoloration at the fingers and toes is likely related to Raynaud's phenomena. She reports this is a chronic finding.  [name], MD  02/20/2014  11:43 AM

## 2014-02-21 ENCOUNTER — Ambulatory Visit: Payer: Medicare Other | Admitting: Nutrition

## 2014-02-21 ENCOUNTER — Telehealth: Payer: Self-pay | Admitting: *Deleted

## 2014-02-21 NOTE — Telephone Encounter (Signed)
Pt called with update :Reports she felt "terrible" after office visit 02/20/14. Noticed fever (101) when she got home. Temp 99 this morning. Latest temp 101.2.  Nausea last night- improved now. 2 loose stools in last 24 hours. Pt reports she is drinking Gatorade and water. Was able to eat bland foods this AM. Fatigued. Reviewed with Dr. Benay Spice: Pt should go to ED to be evaluated. MD does not think this is related to chemo.  Called pt with above instructions. She voiced understanding. Stated she does not wish to go to ED. Informed her that elevated temp may be sign of infection and ED would be best to rule that out. She agreed to do go to ED.

## 2014-02-21 NOTE — Progress Notes (Signed)
Received message from RN. Patient was having difficulties with taste alterations and fatigue.  I contacted patient by phone this morning and spoke with her regarding appetite.  Patient reports everything tastes different.  She is very tired and feels that she sleeps a lot during the day.  Weight stable and was documented as 131 pounds July 9.  Nutrition diagnosis: Food and nutrition related knowledge deficit continues.  Intervention: Patient educated on strategies for improving taste alterations. Recommended patient consume cold soft, moist protein foods in small amounts throughout the day.   Provided strategies for increased oral intake.   Will mail fact sheets for patient.   Questions were answered.  Teach back method used.  Monitoring, evaluation, goals: Patient will tolerate adequate calories and protein to minimize weight loss.  Next visit: I will attempt to contact patient by phone for followup.   **Disclaimer: This note was dictated with voice recognition software. Similar sounding words can inadvertently be transcribed and this note may contain transcription errors which may not have been corrected upon publication of note.**

## 2014-02-22 ENCOUNTER — Emergency Department (HOSPITAL_COMMUNITY): Payer: Medicare Other

## 2014-02-22 ENCOUNTER — Encounter (HOSPITAL_COMMUNITY): Payer: Self-pay | Admitting: Emergency Medicine

## 2014-02-22 ENCOUNTER — Emergency Department (HOSPITAL_COMMUNITY)
Admission: EM | Admit: 2014-02-22 | Discharge: 2014-02-22 | Disposition: A | Discharge status: Home / Self Care | Payer: Medicare Other | Attending: Emergency Medicine | Admitting: Emergency Medicine

## 2014-02-22 DIAGNOSIS — C969 Malignant neoplasm of lymphoid, hematopoietic and related tissue, unspecified: Secondary | ICD-10-CM | POA: Insufficient documentation

## 2014-02-22 DIAGNOSIS — I10 Essential (primary) hypertension: Secondary | ICD-10-CM | POA: Insufficient documentation

## 2014-02-22 DIAGNOSIS — C189 Malignant neoplasm of colon, unspecified: Secondary | ICD-10-CM | POA: Insufficient documentation

## 2014-02-22 DIAGNOSIS — R197 Diarrhea, unspecified: Secondary | ICD-10-CM | POA: Insufficient documentation

## 2014-02-22 DIAGNOSIS — Z8541 Personal history of malignant neoplasm of cervix uteri: Secondary | ICD-10-CM | POA: Insufficient documentation

## 2014-02-22 DIAGNOSIS — R011 Cardiac murmur, unspecified: Secondary | ICD-10-CM | POA: Insufficient documentation

## 2014-02-22 DIAGNOSIS — R509 Fever, unspecified: Secondary | ICD-10-CM | POA: Insufficient documentation

## 2014-02-22 DIAGNOSIS — R112 Nausea with vomiting, unspecified: Secondary | ICD-10-CM

## 2014-02-22 LAB — CBC WITH DIFFERENTIAL/PLATELET
Basophils Absolute: 0 10*3/uL (ref 0.0–0.1)
Basophils Relative: 0 % (ref 0–1)
EOS ABS: 0.1 10*3/uL (ref 0.0–0.7)
Eosinophils Relative: 1 % (ref 0–5)
HCT: 35.7 % — ABNORMAL LOW (ref 36.0–46.0)
HEMOGLOBIN: 12.4 g/dL (ref 12.0–15.0)
LYMPHS ABS: 1.1 10*3/uL (ref 0.7–4.0)
Lymphocytes Relative: 16 % (ref 12–46)
MCH: 30.8 pg (ref 26.0–34.0)
MCHC: 34.7 g/dL (ref 30.0–36.0)
MCV: 88.6 fL (ref 78.0–100.0)
Monocytes Absolute: 1.7 10*3/uL — ABNORMAL HIGH (ref 0.1–1.0)
Monocytes Relative: 24 % — ABNORMAL HIGH (ref 3–12)
NEUTROS ABS: 3.9 10*3/uL (ref 1.7–7.7)
Neutrophils Relative %: 58 % (ref 43–77)
Platelets: 232 10*3/uL (ref 150–400)
RBC: 4.03 MIL/uL (ref 3.87–5.11)
RDW: 21.3 % — ABNORMAL HIGH (ref 11.5–15.5)
WBC: 6.8 10*3/uL (ref 4.0–10.5)

## 2014-02-22 LAB — COMPREHENSIVE METABOLIC PANEL
ALK PHOS: 55 U/L (ref 39–117)
ALT: 9 U/L (ref 0–35)
AST: 15 U/L (ref 0–37)
Albumin: 3.1 g/dL — ABNORMAL LOW (ref 3.5–5.2)
Anion gap: 14 (ref 5–15)
BUN: 15 mg/dL (ref 6–23)
CHLORIDE: 97 meq/L (ref 96–112)
CO2: 24 mEq/L (ref 19–32)
Calcium: 8.8 mg/dL (ref 8.4–10.5)
Creatinine, Ser: 0.81 mg/dL (ref 0.50–1.10)
GFR calc non Af Amer: 69 mL/min — ABNORMAL LOW (ref >90)
GFR, EST AFRICAN AMERICAN: 80 mL/min — AB (ref >90)
GLUCOSE: 111 mg/dL — AB (ref 70–99)
POTASSIUM: 3.7 meq/L (ref 3.7–5.3)
Sodium: 135 mEq/L — ABNORMAL LOW (ref 137–147)
Total Bilirubin: 0.8 mg/dL (ref 0.3–1.2)
Total Protein: 6.4 g/dL (ref 6.0–8.3)

## 2014-02-22 LAB — URINE MICROSCOPIC-ADD ON

## 2014-02-22 LAB — URINALYSIS, ROUTINE W REFLEX MICROSCOPIC
GLUCOSE, UA: NEGATIVE mg/dL
Hgb urine dipstick: NEGATIVE
KETONES UR: NEGATIVE mg/dL
NITRITE: NEGATIVE
PROTEIN: 30 mg/dL — AB
Specific Gravity, Urine: 1.03 (ref 1.005–1.030)
Urobilinogen, UA: 0.2 mg/dL (ref 0.0–1.0)
pH: 5.5 (ref 5.0–8.0)

## 2014-02-22 MED ORDER — ONDANSETRON HCL 4 MG/2ML IJ SOLN
4.0000 mg | Freq: Once | INTRAMUSCULAR | Status: AC
Start: 2014-02-22 — End: 2014-02-22
  Administered 2014-02-22: 4 mg via INTRAVENOUS
  Filled 2014-02-22: qty 2

## 2014-02-22 MED ORDER — CIPROFLOXACIN HCL 500 MG PO TABS: 500.0000 mg | ORAL_TABLET | Freq: Two times a day (BID) | ORAL | Status: DC

## 2014-02-22 MED ORDER — ONDANSETRON 8 MG PO TBDP: 8.0000 mg | ORAL_TABLET | Freq: Three times a day (TID) | ORAL | Status: DC | PRN

## 2014-02-22 MED ORDER — ACETAMINOPHEN 325 MG PO TABS
650.0000 mg | ORAL_TABLET | Freq: Once | ORAL | Status: AC
  Administered 2014-02-22: 650 mg via ORAL
  Filled 2014-02-22: qty 2

## 2014-02-22 MED ORDER — SODIUM CHLORIDE 0.9 % IV BOLUS (SEPSIS)
500.0000 mL | Freq: Once | INTRAVENOUS | Status: AC
  Administered 2014-02-22: 500 mL via INTRAVENOUS

## 2014-02-22 MED ORDER — SODIUM CHLORIDE 0.9 % IV BOLUS (SEPSIS)
1000.0000 mL | Freq: Once | INTRAVENOUS | Status: AC
Start: 2014-02-22 — End: 2014-02-22
  Administered 2014-02-22: 1000 mL via INTRAVENOUS

## 2014-02-22 NOTE — ED Notes (Signed)
Pt is aware that a urine sample is needed.  

## 2014-02-22 NOTE — ED Notes (Signed)
Initial contact-pt A&Ox4. Ambulatory with steady gait, moving all extremities equally. C/o fever with Tmax 101 at home. C/o nausea with no actual emesis occurences-only dry heaves. Per daughter "when she drinks too much liquid she feels sick." On third round of PO chemo. Since starting chemo pt states "I have had lots of diarrhea." In NAD. MD Steinl at bedside.

## 2014-02-22 NOTE — Discharge Instructions (Signed)
Rest. Drink plenty of fluids. Hold/stop the xeloda until instructed otherwise by your doctor/oncologist.  Take zofran as need for nausea. Take cipro (antibiotic) as prescribed. Follow up with your doctor Monday for recheck if symptoms fail to resolve. Return to ER if worse, new symptoms, persistent vomiting, weak/faint, abdominal pain, severe diarrhea, other concern.     Nausea and Vomiting Nausea is a sick feeling that often comes before throwing up (vomiting). Vomiting is a reflex where stomach contents come out of your mouth. Vomiting can cause severe loss of body fluids (dehydration). Children and elderly adults can become dehydrated quickly, especially if they also have diarrhea. Nausea and vomiting are symptoms of a condition or disease. It is important to find the cause of your symptoms. CAUSES   Direct irritation of the stomach lining. This irritation can result from increased acid production (gastroesophageal reflux disease), infection, food poisoning, taking certain medicines (such as nonsteroidal anti-inflammatory drugs), alcohol use, or tobacco use.  Signals from the brain.These signals could be caused by a headache, heat exposure, an inner ear disturbance, increased pressure in the brain from injury, infection, a tumor, or a concussion, pain, emotional stimulus, or metabolic problems.  An obstruction in the gastrointestinal tract (bowel obstruction).  Illnesses such as diabetes, hepatitis, gallbladder problems, appendicitis, kidney problems, cancer, sepsis, atypical symptoms of a heart attack, or eating disorders.  Medical treatments such as chemotherapy and radiation.  Receiving medicine that makes you sleep (general anesthetic) during surgery. DIAGNOSIS Your caregiver may ask for tests to be done if the problems do not improve after a few days. Tests may also be done if symptoms are severe or if the reason for the nausea and vomiting is not clear. Tests may include:  Urine  tests.  Blood tests.  Stool tests.  Cultures (to look for evidence of infection).  X-rays or other imaging studies. Test results can help your caregiver make decisions about treatment or the need for additional tests. TREATMENT You need to stay well hydrated. Drink frequently but in small amounts.You may wish to drink water, sports drinks, clear broth, or eat frozen ice pops or gelatin dessert to help stay hydrated.When you eat, eating slowly may help prevent nausea.There are also some antinausea medicines that may help prevent nausea. HOME CARE INSTRUCTIONS   Take all medicine as directed by your caregiver.  If you do not have an appetite, do not force yourself to eat. However, you must continue to drink fluids.  If you have an appetite, eat a normal diet unless your caregiver tells you differently.  Eat a variety of complex carbohydrates (rice, wheat, potatoes, bread), lean meats, yogurt, fruits, and vegetables.  Avoid high-fat foods because they are more difficult to digest.  Drink enough water and fluids to keep your urine clear or pale yellow.  If you are dehydrated, ask your caregiver for specific rehydration instructions. Signs of dehydration may include:  Severe thirst.  Dry lips and mouth.  Dizziness.  Dark urine.  Decreasing urine frequency and amount.  Confusion.  Rapid breathing or pulse. SEEK IMMEDIATE MEDICAL CARE IF:   You have blood or brown flecks (like coffee grounds) in your vomit.  You have black or bloody stools.  You have a severe headache or stiff neck.  You are confused.  You have severe abdominal pain.  You have chest pain or trouble breathing.  You do not urinate at least once every 8 hours.  You develop cold or clammy skin.  You continue to vomit for  longer than 24 to 48 hours.  You have a fever. MAKE SURE YOU:   Understand these instructions.  Will watch your condition.  Will get help right away if you are not doing  well or get worse. Document Released: 08/01/2005 Document Revised: 10/24/2011 Document Reviewed: 12/29/2010 [facility] Patient Information 2015 [place], [place]. This information is not intended to replace advice given to you by your health care provider. Make sure you discuss any questions you have with your health care provider.    Diarrhea Diarrhea is frequent loose and watery bowel movements. It can cause you to feel weak and dehydrated. Dehydration can cause you to become tired and thirsty, have a dry mouth, and have decreased urination that often is dark yellow. Diarrhea is a sign of another problem, most often an infection that will not last long. In most cases, diarrhea typically lasts 2-3 days. However, it can last longer if it is a sign of something more serious. It is important to treat your diarrhea as directed by your caregive to lessen or prevent future episodes of diarrhea. CAUSES  Some common causes include:  Gastrointestinal infections caused by viruses, bacteria, or parasites.  Food poisoning or food allergies.  Certain medicines, such as antibiotics, chemotherapy, and laxatives.  Artificial sweeteners and fructose.  Digestive disorders. HOME CARE INSTRUCTIONS  Ensure adequate fluid intake (hydration): have 1 cup (8 oz) of fluid for each diarrhea episode. Avoid fluids that contain simple sugars or sports drinks, fruit juices, whole milk products, and sodas. Your urine should be clear or pale yellow if you are drinking enough fluids. Hydrate with an oral rehydration solution that you can purchase at pharmacies, retail stores, and online. You can prepare an oral rehydration solution at home by mixing the following ingredients together:   - tsp table salt.   tsp baking soda.   tsp salt substitute containing potassium chloride.  1  tablespoons sugar.  1 L (34 oz) of water.  Certain foods and beverages may increase the speed at which food moves through the  gastrointestinal (GI) tract. These foods and beverages should be avoided and include:  Caffeinated and alcoholic beverages.  High-fiber foods, such as raw fruits and vegetables, nuts, seeds, and whole grain breads and cereals.  Foods and beverages sweetened with sugar alcohols, such as xylitol, sorbitol, and mannitol.  Some foods may be well tolerated and may help thicken stool including:  Starchy foods, such as rice, toast, pasta, low-sugar cereal, oatmeal, grits, baked potatoes, crackers, and bagels.  Bananas.  Applesauce.  Add probiotic-rich foods to help increase healthy bacteria in the GI tract, such as yogurt and fermented milk products.  Wash your hands well after each diarrhea episode.  Only take over-the-counter or prescription medicines as directed by your caregiver.  Take a warm bath to relieve any burning or pain from frequent diarrhea episodes. SEEK IMMEDIATE MEDICAL CARE IF:   You are unable to keep fluids down.  You have persistent vomiting.  You have blood in your stool, or your stools are black and tarry.  You do not urinate in 6-8 hours, or there is only a small amount of very dark urine.  You have abdominal pain that increases or localizes.  You have weakness, dizziness, confusion, or lightheadedness.  You have a severe headache.  Your diarrhea gets worse or does not get better.  You have a fever or persistent symptoms for more than 2-3 days.  You have a fever and your symptoms suddenly get worse. MAKE SURE  YOU:   Understand these instructions.  Will watch your condition.  Will get help right away if you are not doing well or get worse. Document Released: 07/22/2002 Document Revised: 07/18/2012 Document Reviewed: 04/08/2012 [facility] Patient Information 2015 [place], [place]. This information is not intended to replace advice given to you by your health care provider. Make sure you discuss any questions you have with your health care provider.

## 2014-02-22 NOTE — ED Provider Notes (Signed)
CSN: [id number]     Arrival date & time 02/22/14  1157 History   First MD Initiated Contact with Patient 02/22/14 1237     Chief Complaint  Patient presents with  . Fever  . Nausea  . Emesis  . Diarrhea  . Cancer     (Consider location/radiation/quality/duration/timing/severity/associated sxs/prior Treatment) Patient is a 78 y.o. female presenting with fever, vomiting, and diarrhea. The history is provided by the patient and a relative.  Fever Associated symptoms: diarrhea and vomiting   Associated symptoms: no chest pain, no confusion, no cough, no dysuria, no headaches, no rash, no rhinorrhea and no sore throat   Emesis Associated symptoms: diarrhea   Associated symptoms: no abdominal pain, no headaches and no sore throat   Diarrhea Associated symptoms: fever and vomiting   Associated symptoms: no abdominal pain and no headaches   pt with hx colon ca, s/p partial colectomy a few month ago, mets to lymph nodes, c/o nvd, and fevers, in the past 2 days. Persistent nausea w dry heaves, no bilious or bloody emesis. Diarrhea watery, not bloody. No abd pain or distension. Fevers x 2 days. Denies headaches. No cough or uri c/o. No chest pain. No sob. No dysuria or gu c/o. No rash. No recent abx. States only new med is Xeloda, which she was started on 2-3 weeks ago.      Past Medical History  Diagnosis Date  . Cancer     cervical ca  . Anemia   . Blood transfusion without reported diagnosis jan 2015  . Colon cancer   . Heart murmur   . Hypertension     no bp meds   . Liver cyst   . Complication of anesthesia     slow to awaken in past   Past Surgical History  Procedure Laterality Date  . Abdominal hysterectomy      vaginal- partial  . Back surgery  1981    lower lumbar  . Tubal ligation  1968  . Laparoscopic right colectomy Right 10/22/2013    Procedure: LAPAROSCOPIC RIGHT COLECTOMY ;  Surgeon: [name], MD;  Location: [facility];  Service: General;  Laterality: Right;   . Salpingoophorectomy  10/22/2013    Procedure: Marquette Saa;  Surgeon: [name]. [name], MD;  Location: [facility];  Service: Gynecology;;   Family History  Problem Relation Age of Onset  . Diabetes Sister   . Cancer Sister     leukemia  . Diabetes Brother   . Heart failure Brother   . Hypertension Brother   . Breast cancer Maternal Aunt    History  Substance Use Topics  . Smoking status: Never Smoker   . Smokeless tobacco: Never Used  . Alcohol Use: No   OB History   Grav Para Term Preterm Abortions TAB SAB Ect Mult Living                 Review of Systems  Constitutional: Positive for fever.  HENT: Negative for rhinorrhea and sore throat.   Eyes: Negative for redness.  Respiratory: Negative for cough and shortness of breath.   Cardiovascular: Negative for chest pain and leg swelling.  Gastrointestinal: Positive for vomiting and diarrhea. Negative for abdominal pain and blood in stool.  Genitourinary: Negative for dysuria and flank pain.  Musculoskeletal: Negative for back pain and neck pain.  Skin: Negative for rash.  Neurological: Negative for headaches.  Hematological: Does not bruise/bleed easily.  Psychiatric/Behavioral: Negative for confusion.  Allergies  Review of patient's allergies indicates no known allergies.  Home Medications   Prior to Admission medications   Medication Sig Start Date End Date Taking? Authorizing Provider  capecitabine (XELODA) 500 MG tablet Take 3 tablets by mouth (1500mg ) every morning and 3 tablets every evening for 14 days on and 7 days off 01/29/14   Ladell Pier, MD  Loperamide HCl (IMODIUM A-D PO) Take 2 mg by mouth as needed.    Historical Provider, MD  prochlorperazine (COMPAZINE) 5 MG tablet Take 1 tablet (5 mg total) by mouth every 6 (six) hours as needed for nausea or vomiting. 12/11/13   Ladell Pier, MD   BP 122/72  Pulse 95  Temp(Src) 98.8 F (37.1 C) (Oral)  Resp 18  SpO2 96% Physical Exam   Nursing note and vitals reviewed. Constitutional: She is oriented to person, place, and time. She appears well-developed and well-nourished. No distress.  HENT:  Mouth/Throat: Oropharynx is clear and moist.  Eyes: Conjunctivae are normal. Pupils are equal, round, and reactive to light. No scleral icterus.  Neck: Neck supple. No tracheal deviation present.  No stiffness or rigidity  Cardiovascular: Normal rate, regular rhythm, normal heart sounds and intact distal pulses.  Exam reveals no gallop and no friction rub.   No murmur heard. Pulmonary/Chest: Effort normal and breath sounds normal. No respiratory distress.  Abdominal: Soft. Normal appearance and bowel sounds are normal. She exhibits no distension and no mass. There is no tenderness. There is no rebound and no guarding.  Genitourinary:  No cva tenderness  Musculoskeletal: She exhibits no edema and no tenderness.  Neurological: She is alert and oriented to person, place, and time.  Skin: Skin is warm and dry. No rash noted. She is not diaphoretic.  Psychiatric: She has a normal mood and affect.    ED Course  Procedures (including critical care time) Labs Review  Results for orders placed during the hospital encounter of 02/22/14  CBC WITH DIFFERENTIAL      Result Value Ref Range   WBC 6.8  4.0 - 10.5 K/uL   RBC 4.03  3.87 - 5.11 MIL/uL   Hemoglobin 12.4  12.0 - 15.0 g/dL   HCT 35.7 (*) 36.0 - 46.0 %   MCV 88.6  78.0 - 100.0 fL   MCH 30.8  26.0 - 34.0 pg   MCHC 34.7  30.0 - 36.0 g/dL   RDW 21.3 (*) 11.5 - 15.5 %   Platelets 232  150 - 400 K/uL   Neutrophils Relative % 58  43 - 77 %   Neutro Abs 3.9  1.7 - 7.7 K/uL   Lymphocytes Relative 16  12 - 46 %   Lymphs Abs 1.1  0.7 - 4.0 K/uL   Monocytes Relative 24 (*) 3 - 12 %   Monocytes Absolute 1.7 (*) 0.1 - 1.0 K/uL   Eosinophils Relative 1  0 - 5 %   Eosinophils Absolute 0.1  0.0 - 0.7 K/uL   Basophils Relative 0  0 - 1 %   Basophils Absolute 0.0  0.0 - 0.1 K/uL   COMPREHENSIVE METABOLIC PANEL      Result Value Ref Range   Sodium 135 (*) 137 - 147 mEq/L   Potassium 3.7  3.7 - 5.3 mEq/L   Chloride 97  96 - 112 mEq/L   CO2 24  19 - 32 mEq/L   Glucose, Bld 111 (*) 70 - 99 mg/dL   BUN 15  6 - 23 mg/dL  Creatinine, Ser 0.81  0.50 - 1.10 mg/dL   Calcium 8.8  8.4 - 10.5 mg/dL   Total Protein 6.4  6.0 - 8.3 g/dL   Albumin 3.1 (*) 3.5 - 5.2 g/dL   AST 15  0 - 37 U/L   ALT 9  0 - 35 U/L   Alkaline Phosphatase 55  39 - 117 U/L   Total Bilirubin 0.8  0.3 - 1.2 mg/dL   GFR calc non Af Amer 69 (*) >90 mL/min   GFR calc Af Amer 80 (*) >90 mL/min   Anion gap 14  5 - 15  URINALYSIS, ROUTINE W REFLEX MICROSCOPIC      Result Value Ref Range   Color, Urine ORANGE (*) YELLOW   APPearance CLOUDY (*) CLEAR   Specific Gravity, Urine 1.030  1.005 - 1.030   pH 5.5  5.0 - 8.0   Glucose, UA NEGATIVE  NEGATIVE mg/dL   Hgb urine dipstick NEGATIVE  NEGATIVE   Bilirubin Urine SMALL (*) NEGATIVE   Ketones, ur NEGATIVE  NEGATIVE mg/dL   Protein, ur 30 (*) NEGATIVE mg/dL   Urobilinogen, UA 0.2  0.0 - 1.0 mg/dL   Nitrite NEGATIVE  NEGATIVE   Leukocytes, UA SMALL (*) NEGATIVE  URINE MICROSCOPIC-ADD ON      Result Value Ref Range   WBC, UA 0-2  <3 WBC/hpf   Bacteria, UA FEW (*) RARE   Casts HYALINE CASTS (*) NEGATIVE   Urine-Other MUCOUS PRESENT     Dg Chest Port 1 View  02/22/2014   CLINICAL DATA:  Fever for 3 days, colon cancer on chemotherapy, history hypertension  EXAM: PORTABLE CHEST - 1 VIEW  COMPARISON:  Portable exam 3267 hr compared to 09/03/2013  FINDINGS: Normal heart size, mediastinal contours, and pulmonary vascularity.  Lungs appear hyperinflated but clear.  No pleural effusion or pneumothorax.  Bones demineralized.  IMPRESSION: No acute abnormalities.   Electronically Signed   By: Lavonia Dana M.D.   On: 02/22/2014 13:44     MDM  Iv ns bolus. zofran iv.  Labs.  Reviewed nursing notes and prior charts for additional history.   Discussed pt with  on call oncologist, Dr Earlie Server - he indicates other than holding pts xeloda, would give few days rx w cipro for home, they will follow up in clinic.  Recheck pt - pt feels much improved. No nv.  abd soft nt.  Tolerating po fluids.  Pt appears currently stable for d/c.  To return if worse, persistent vomiting, abdominal pain, severe diarrhea, weak/faint.    Mirna Mires, MD 02/22/14 [phone]

## 2014-02-22 NOTE — ED Notes (Signed)
Pt did not have stool prior to discharge. MD aware

## 2014-02-22 NOTE — ED Notes (Signed)
PT has colon cancer and is taking chemo pills.  States that since Thursday, she has had fever and NVD.  She has had fever of 101 at home.  Down to 98.8 now.

## 2014-02-23 LAB — URINE CULTURE
Colony Count: NO GROWTH
Culture: NO GROWTH

## 2014-02-24 ENCOUNTER — Telehealth: Payer: Self-pay | Admitting: *Deleted

## 2014-02-24 MED ORDER — DIPHENOXYLATE-ATROPINE 2.5-0.025 MG PO TABS: 1.0000 | ORAL_TABLET | Freq: Four times a day (QID) | ORAL | Status: DC | PRN

## 2014-02-24 NOTE — Telephone Encounter (Signed)
   Provider input needed: Diarrhea & Can't sleep   Reason for call: Diarrhea, Can't sleep.  Constitutional: positive for fatigue Gastrointestinal: positive for diarrhea and 3-6 loose stools day despite taking up to #6 Imodium/day Integument/breast: positive for bruises, but upon further discussion these are from IV sticks and she is having no spontaneous bleeding   ALLERGIES:  has No Known Allergies.  Patient last received chemotherapy/ treatment on 6/27-7/8-Xeloda  Patient was last seen in the office on 02/20/14  Next appt is 03/13/14   Is patient having fevers greater than 100.5?  No, temp now 97.9   Is patient having uncontrolled pain, or new pain? no   Is patient having new back pain that changes with position (worsens or eases when laying down?)  no   Is patient able to eat and drink? yes    Is patient able to pass stool without difficulty?   yes, 3-6 loose stools/day     Is patient having uncontrolled nausea?  no    Summary Based on the above information advised patient to  Push po fluids, Continue the Cipro she was put on in ER this weekend, will call in Lomotil and she can take 1-2 tablets qid prn per Dr. Benay Spice. Hold her Xeloda and call office with update on 7/14 or 7/15. She declines to come in to office today saying "I'm too weak because I'm not getting much sleep". Does not want anything to sleep, saying "I don't want any more medicine".   [name]  02/24/2014, 12:27 PM   Background Info  Cynthia [name]   DOB: Sep 05, 1935   MR#: [id number]   CSN#   [id number] 02/24/2014

## 2014-02-26 ENCOUNTER — Telehealth: Payer: Self-pay | Admitting: Oncology

## 2014-02-26 ENCOUNTER — Telehealth: Payer: Self-pay | Admitting: *Deleted

## 2014-02-26 NOTE — Telephone Encounter (Addendum)
Call from pt requesting office visit appt. Reports she feels better, has had 5 loose stools in 24 hours. Taking antidiarrheal med. Has not had a fever in 2 days. Completed antibiotic. Still feeling weak, although a improved a bit. Reviewed with Dr. Benay Spice: appt given for 03/03/14 with Lattie Haw, NP. Called pt with appt, she voiced understanding, knows to [place] until office visit.

## 2014-02-26 NOTE — Telephone Encounter (Signed)
lmonvm for pt re appt for 7/20. schedule mailed.

## 2014-02-28 LAB — CULTURE, BLOOD (ROUTINE X 2)
CULTURE: NO GROWTH
Culture: NO GROWTH

## 2014-03-03 ENCOUNTER — Other Ambulatory Visit (HOSPITAL_BASED_OUTPATIENT_CLINIC_OR_DEPARTMENT_OTHER): Payer: Medicare Other

## 2014-03-03 ENCOUNTER — Ambulatory Visit (HOSPITAL_BASED_OUTPATIENT_CLINIC_OR_DEPARTMENT_OTHER): Payer: Medicare Other | Admitting: Nurse Practitioner

## 2014-03-03 VITALS — BP 155/71 | HR 77 | Temp 98.7°F | Resp 17 | Ht 61.0 in | Wt 133.2 lb

## 2014-03-03 DIAGNOSIS — Z8541 Personal history of malignant neoplasm of cervix uteri: Secondary | ICD-10-CM

## 2014-03-03 DIAGNOSIS — C180 Malignant neoplasm of cecum: Secondary | ICD-10-CM

## 2014-03-03 DIAGNOSIS — Z86718 Personal history of other venous thrombosis and embolism: Secondary | ICD-10-CM

## 2014-03-03 DIAGNOSIS — R197 Diarrhea, unspecified: Secondary | ICD-10-CM

## 2014-03-03 DIAGNOSIS — C182 Malignant neoplasm of ascending colon: Secondary | ICD-10-CM

## 2014-03-03 LAB — COMPREHENSIVE METABOLIC PANEL (CC13)
ALT: 16 U/L (ref 0–55)
AST: 17 U/L (ref 5–34)
Albumin: 2.7 g/dL — ABNORMAL LOW (ref 3.5–5.0)
Alkaline Phosphatase: 47 U/L (ref 40–150)
Anion Gap: 7 mEq/L (ref 3–11)
BUN: 13.2 mg/dL (ref 7.0–26.0)
CALCIUM: 8.8 mg/dL (ref 8.4–10.4)
CO2: 29 mEq/L (ref 22–29)
Chloride: 107 mEq/L (ref 98–109)
Creatinine: 0.7 mg/dL (ref 0.6–1.1)
Glucose: 109 mg/dl (ref 70–140)
POTASSIUM: 3.9 meq/L (ref 3.5–5.1)
SODIUM: 143 meq/L (ref 136–145)
TOTAL PROTEIN: 6.1 g/dL — AB (ref 6.4–8.3)
Total Bilirubin: 0.33 mg/dL (ref 0.20–1.20)

## 2014-03-03 LAB — CBC WITH DIFFERENTIAL/PLATELET
BASO%: 0.4 % (ref 0.0–2.0)
Basophils Absolute: 0 10*3/uL (ref 0.0–0.1)
EOS%: 2.2 % (ref 0.0–7.0)
Eosinophils Absolute: 0.2 10*3/uL (ref 0.0–0.5)
HCT: 32.8 % — ABNORMAL LOW (ref 34.8–46.6)
HGB: 10.8 g/dL — ABNORMAL LOW (ref 11.6–15.9)
LYMPH%: 14.9 % (ref 14.0–49.7)
MCH: 30.6 pg (ref 25.1–34.0)
MCHC: 32.8 g/dL (ref 31.5–36.0)
MCV: 93.5 fL (ref 79.5–101.0)
MONO#: 0.6 10*3/uL (ref 0.1–0.9)
MONO%: 7.8 % (ref 0.0–14.0)
NEUT#: 6 10*3/uL (ref 1.5–6.5)
NEUT%: 74.7 % (ref 38.4–76.8)
Platelets: 405 10*3/uL — ABNORMAL HIGH (ref 145–400)
RBC: 3.51 10*6/uL — ABNORMAL LOW (ref 3.70–5.45)
RDW: 23.8 % — AB (ref 11.2–14.5)
WBC: 8.1 10*3/uL (ref 3.9–10.3)
lymph#: 1.2 10*3/uL (ref 0.9–3.3)

## 2014-03-03 NOTE — Progress Notes (Signed)
  [facility] OFFICE PROGRESS NOTE   Diagnosis:  Colon cancer.  INTERVAL HISTORY:   Cynthia [name] returns prior to scheduled followup. She completed cycle 3 Xeloda beginning 02/05/2014. She was seen in the emergency Department on 02/22/2014 with fever, nausea and diarrhea. Blood cultures and a urine culture were negative. Chest x-ray was negative. She completed a course of Cipro.  She reports persistent diarrhea up until yesterday. She has been taking Lomotil. She last took Lomotil 03/02/2014. She has had no loose stools so far today. No more fevers. No nausea/vomiting. No mouth sores. She notes that her palms are mildly red. No hand or foot pain. She is tolerating fluids without difficulty.  Objective:  Vital signs in last 24 hours:  Blood pressure 155/71, pulse 77, temperature 98.7 F (37.1 C), temperature source Oral, resp. rate 17, height _0  (1.549 m), weight 133 lb 3.2 oz (60.419 kg), SpO2 98.00%.    HEENT: No thrush or ulceration. Resp: Lungs clear. Cardio: Regular cardiac rhythm. GI: Abdomen is soft and nontender. No mass. No organomegaly. Bowel sounds active. Vascular: No leg edema.  Skin: Palms with skin thickening and hyperpigmentation. No erythema or skin breakdown.    Lab Results:  Lab Results  Component Value Date   WBC 8.1 03/03/2014   HGB 10.8* 03/03/2014   HCT 32.8* 03/03/2014   MCV 93.5 03/03/2014   PLT 405* 03/03/2014   NEUTROABS 6.0 03/03/2014    Imaging:  No results found.  Medications: I have reviewed the patient's current medications.  Assessment/Plan: 1. Moderately differentiated adenocarcinoma of the cecum, stage III (T4a, N1), status post a laparoscopic assisted right colectomy 10/22/2013 The tumor returned microsatellite stable with normal mismatch repair protein expression  Cycle 1 adjuvant Xeloda 12/25/2013.  Cycle 2 adjuvant Xeloda 01/15/2014.  Cycle 3 adjuvant Xeloda 02/05/2014. 2. Right ovary cystadenofibroma, status post a  bilateral oophorectomy 10/22/2013 3. Remote history of cervical cancer. 4. Iron deficiency anemia-resolved. 5. Right calf deep vein thrombosis 10/24/2013-she completed 3 months of xarelto. 6. Indeterminate 12 mm hypermetabolic right pelvic density on the staging PET scan 10/04/2013-CT followup recommended per the GI tumor conference 11/13/2013. 7. Soft tissue implant overlying the posterior right liver on the CT 19/41/7408-XKG hypermetabolic on the PET scan 81/85/6314. 8. Diarrhea-likely secondary to Xeloda.   Disposition: Cynthia [name] has completed 3 cycles of adjuvant Xeloda. Cycle 4 was held due to diarrhea. The diarrhea persisted through yesterday.  The diarrhea is likely related to Xeloda. We are placing the Xeloda on hold. She was given a collection container for C. difficile if the diarrhea recurs. Otherwise she will return for her scheduled followup visit on 03/13/2014.  She will contact the office in the interim with further problems.    Ned Card ANP/GNP-BC   03/03/2014  4:57 PM

## 2014-03-05 ENCOUNTER — Telehealth: Payer: Self-pay | Admitting: *Deleted

## 2014-03-05 ENCOUNTER — Ambulatory Visit: Payer: Medicare Other

## 2014-03-05 ENCOUNTER — Other Ambulatory Visit: Payer: Self-pay | Admitting: *Deleted

## 2014-03-05 DIAGNOSIS — R197 Diarrhea, unspecified: Secondary | ICD-10-CM

## 2014-03-05 LAB — CLOSTRIDIUM DIFFICILE BY PCR: Toxigenic C. Difficile by PCR: POSITIVE — CR

## 2014-03-05 MED ORDER — METRONIDAZOLE 500 MG PO TABS: 500.0000 mg | ORAL_TABLET | Freq: Three times a day (TID) | ORAL | Status: DC

## 2014-03-05 NOTE — Telephone Encounter (Signed)
Per Dr. Benay Spice; notified pt that C.Diff results came back POSITIVE.  Instruction given to take Flagyl 500 mg three times a day for 10 days; HOLD Xeloda and to call office if diarrhea is not better in 2-3 days.  Pt verbalized understanding of instructions.

## 2014-03-11 ENCOUNTER — Other Ambulatory Visit: Payer: Self-pay

## 2014-03-12 ENCOUNTER — Other Ambulatory Visit: Payer: Self-pay | Admitting: *Deleted

## 2014-03-12 DIAGNOSIS — C182 Malignant neoplasm of ascending colon: Secondary | ICD-10-CM

## 2014-03-13 ENCOUNTER — Ambulatory Visit (HOSPITAL_BASED_OUTPATIENT_CLINIC_OR_DEPARTMENT_OTHER): Payer: Medicare Other | Admitting: Nurse Practitioner

## 2014-03-13 ENCOUNTER — Telehealth: Payer: Self-pay | Admitting: Oncology

## 2014-03-13 ENCOUNTER — Other Ambulatory Visit (HOSPITAL_BASED_OUTPATIENT_CLINIC_OR_DEPARTMENT_OTHER): Payer: Medicare Other

## 2014-03-13 VITALS — BP 158/95 | HR 74 | Temp 98.4°F | Resp 18 | Ht 61.0 in | Wt 129.8 lb

## 2014-03-13 DIAGNOSIS — C180 Malignant neoplasm of cecum: Secondary | ICD-10-CM

## 2014-03-13 DIAGNOSIS — C189 Malignant neoplasm of colon, unspecified: Secondary | ICD-10-CM

## 2014-03-13 DIAGNOSIS — C182 Malignant neoplasm of ascending colon: Secondary | ICD-10-CM

## 2014-03-13 LAB — CBC WITH DIFFERENTIAL/PLATELET
BASO%: 1.4 % (ref 0.0–2.0)
Basophils Absolute: 0.1 10*3/uL (ref 0.0–0.1)
EOS%: 2.6 % (ref 0.0–7.0)
Eosinophils Absolute: 0.2 10*3/uL (ref 0.0–0.5)
HCT: 37.1 % (ref 34.8–46.6)
HGB: 11.8 g/dL (ref 11.6–15.9)
LYMPH%: 25.1 % (ref 14.0–49.7)
MCH: 30.4 pg (ref 25.1–34.0)
MCHC: 31.7 g/dL (ref 31.5–36.0)
MCV: 96 fL (ref 79.5–101.0)
MONO#: 0.5 10*3/uL (ref 0.1–0.9)
MONO%: 8.4 % (ref 0.0–14.0)
NEUT%: 62.5 % (ref 38.4–76.8)
NEUTROS ABS: 3.7 10*3/uL (ref 1.5–6.5)
PLATELETS: 327 10*3/uL (ref 145–400)
RBC: 3.86 10*6/uL (ref 3.70–5.45)
RDW: 24.6 % — ABNORMAL HIGH (ref 11.2–14.5)
WBC: 5.9 10*3/uL (ref 3.9–10.3)
lymph#: 1.5 10*3/uL (ref 0.9–3.3)

## 2014-03-13 LAB — COMPREHENSIVE METABOLIC PANEL (CC13)
ALK PHOS: 38 U/L — AB (ref 40–150)
ALT: 16 U/L (ref 0–55)
AST: 25 U/L (ref 5–34)
Albumin: 3.3 g/dL — ABNORMAL LOW (ref 3.5–5.0)
Anion Gap: 7 mEq/L (ref 3–11)
BILIRUBIN TOTAL: 0.45 mg/dL (ref 0.20–1.20)
BUN: 11.3 mg/dL (ref 7.0–26.0)
CO2: 25 mEq/L (ref 22–29)
Calcium: 9.5 mg/dL (ref 8.4–10.4)
Chloride: 109 mEq/L (ref 98–109)
Creatinine: 0.7 mg/dL (ref 0.6–1.1)
Glucose: 86 mg/dl (ref 70–140)
Potassium: 4.5 mEq/L (ref 3.5–5.1)
SODIUM: 142 meq/L (ref 136–145)
TOTAL PROTEIN: 6.7 g/dL (ref 6.4–8.3)

## 2014-03-13 NOTE — Progress Notes (Addendum)
  [place] OFFICE PROGRESS NOTE   Diagnosis: Colon cancer.   INTERVAL HISTORY:   Ms. Geoffroy returns as scheduled. She completed cycle 3 Xeloda beginning 02/05/2014. She developed diarrhea and was seen in the emergency Department on 02/22/2014. We saw her in the office on 03/03/2014. C. difficile by PCR returned positive 03/05/2014. She was started on a course of Flagyl.  The diarrhea is better. Stools are more formed. She is having 2-3 bowel movements a day. No nausea or vomiting. No mouth sores. No hand or foot pain or redness.  Objective:  Vital signs in last 24 hours:  Blood pressure 158/95, pulse 74, temperature 98.4 F (36.9 C), temperature source Oral, resp. rate 18, height $RemoveBe'5\' 1"'ulNhrftKV$  (1.549 m), weight 129 lb 12.8 oz (58.877 kg), SpO2 98.00%.    HEENT: No thrush or ulcerations. Resp: Lungs clear. Cardio: Regular cardiac rhythm. GI: Abdomen soft and nontender. No hepatomegaly. Vascular: No leg edema.  Skin: Palms without erythema.    Lab Results:  Lab Results  Component Value Date   WBC 5.9 03/13/2014   HGB 11.8 03/13/2014   HCT 37.1 03/13/2014   MCV 96.0 03/13/2014   PLT 327 03/13/2014   NEUTROABS 3.7 03/13/2014    Imaging:  No results found.  Medications: I have reviewed the patient's current medications.  Assessment/Plan: 1. Moderately differentiated adenocarcinoma of the cecum, stage III (T4a, N1), status post a laparoscopic assisted right colectomy 10/22/2013 The tumor returned microsatellite stable with normal mismatch repair protein expression  Cycle 1 adjuvant Xeloda 12/25/2013.  Cycle 2 adjuvant Xeloda 01/15/2014.  Cycle 3 adjuvant Xeloda 02/05/2014. 2. Right ovary cystadenofibroma, status post a bilateral oophorectomy 10/22/2013 3. Remote history of cervical cancer. 4. Iron deficiency anemia-resolved. 5. Right calf deep vein thrombosis 10/24/2013-she completed 3 months of xarelto. 6. Indeterminate 12 mm hypermetabolic right pelvic  density on the staging PET scan 10/04/2013-CT followup recommended per the GI tumor conference 11/13/2013. 7. Soft tissue implant overlying the posterior right liver on the CT 42/68/3419-QQI hypermetabolic on the PET scan 29/79/8921. 8. C. difficile colitis 03/05/2014. She is completing a course of Flagyl.   Disposition: Ms. Baise appears stable. She has completed 3 cycles of adjuvant Xeloda. Cycle 4 was held due to diarrhea. The diarrhea was likely due to C. difficile and is improving with Flagyl.  Dr. Benay Spice recommends resuming Xeloda following completion of the course of Flagyl and resolution of diarrhea. The Xeloda will be dose reduced to 1000 mg twice daily for 14 days followed by a 7 day break. We anticipate she will begin cycle 4 on 03/18/2014 if the diarrhea has resolved.  We will see her in followup on 04/04/2014. She understands to discontinue Xeloda and contact the office if the diarrhea recurs.  Patient seen with Dr. Benay Spice. 25 minutes were spent face-to-face at today's visit with the majority of that time involved in counseling/coordination of care.    Ned Card ANP/GNP-BC   03/13/2014  12:56 PM  This was a shared visit with Ned Card. She is completing treatment for C. Difficile colitis. I suspect the C. Difficile calls the diarrhea and fever/chills. The plan is to resume Xeloda with a dose reduction after completion of antibiotics and if the diarrhea has resolved.  Julieanne Manson, M.D.

## 2014-03-13 NOTE — Telephone Encounter (Signed)
Pt confirmed labs/ov per 07/30 POF, gave pt AVS....KJ °

## 2014-03-24 ENCOUNTER — Telehealth: Payer: Self-pay | Admitting: *Deleted

## 2014-03-24 ENCOUNTER — Other Ambulatory Visit: Payer: Self-pay | Admitting: *Deleted

## 2014-03-24 DIAGNOSIS — C182 Malignant neoplasm of ascending colon: Secondary | ICD-10-CM

## 2014-03-24 NOTE — Telephone Encounter (Signed)
Per Dr. Benay Spice : Continue to hold Xeloda. Recheck stool sample for C. Diff. Patient notified and agrees. Will have collection container at front desk for family member to pick up and she will return when she gets sample. She has not had any diarrhea today or needed a Lomotil.

## 2014-03-24 NOTE — Telephone Encounter (Signed)
   Provider input needed: Diarrhea & Xeloda   Reason for call: Weekend diarrhea     ALLERGIES:  has No Known Allergies.  Patient last received chemotherapy/ treatment on Resumed xeloda 1000 mg bid 03/18/14-took on 8/4-8/7. Then stopped  Patient was last seen in the office on 7/30    Next appt is 8/21  Is patient having fevers greater than 100.5?  no   Is patient having uncontrolled pain, or new pain? no   Is patient having new back pain that changes with position (worsens or eases when laying down?)  no   Is patient able to eat and drink? yes    Is patient able to pass stool without difficulty?   yes, loose stools 5/day on Saturday and 4/day on Sunday with Lomotil. Has not had stool or dose of Lomotil today.     Is patient having uncontrolled nausea?  no    patient calls 03/24/2014 with complaint of  Gastrointestinal: positive for diarrhea   Summary Based on the above information advised patient to continue to hold Xeloda and will call her back with MD recommendations.   [name]  03/24/2014, 10:16 AM   Background Info  Cynthia [name]   DOB: 1936-06-29   MR#: [id number]   CSN#   [id number] 03/24/2014

## 2014-03-26 ENCOUNTER — Other Ambulatory Visit: Payer: Self-pay | Admitting: Nurse Practitioner

## 2014-03-26 ENCOUNTER — Ambulatory Visit: Payer: Medicare Other

## 2014-03-26 ENCOUNTER — Telehealth: Payer: Self-pay | Admitting: *Deleted

## 2014-03-26 DIAGNOSIS — C182 Malignant neoplasm of ascending colon: Secondary | ICD-10-CM

## 2014-03-26 DIAGNOSIS — A0472 Enterocolitis due to Clostridium difficile, not specified as recurrent: Secondary | ICD-10-CM

## 2014-03-26 LAB — CLOSTRIDIUM DIFFICILE BY PCR: Toxigenic C. Difficile by PCR: POSITIVE — CR

## 2014-03-26 MED ORDER — METRONIDAZOLE 500 MG PO TABS: 500.0000 mg | ORAL_TABLET | Freq: Three times a day (TID) | ORAL | Status: DC

## 2014-03-26 NOTE — Telephone Encounter (Signed)
Notified pt that test came back positive for C.Diff; instructed to start taking Flagyl 1 tablet three times a day for 10 days.  Pt very upset "why do I continue to test positive"  Pt states she has been off her Xeloda and Cipro and "for the first time since January, I feel good"  "Nobody has any answers for me"  Explained to pt to start Flagyl tonight and be consistent with taking meds as directed 3 times a day.  Pt stated she did have appt with MD;  Informed pt that she does have appt  On 04/04/14 and encouraged pt to keep appt to discuss her concerns.  Pt verbalized understanding of instructions.

## 2014-04-04 ENCOUNTER — Telehealth: Payer: Self-pay | Admitting: Oncology

## 2014-04-04 ENCOUNTER — Other Ambulatory Visit: Payer: Self-pay | Admitting: *Deleted

## 2014-04-04 ENCOUNTER — Encounter: Payer: Self-pay | Admitting: Oncology

## 2014-04-04 ENCOUNTER — Other Ambulatory Visit (HOSPITAL_BASED_OUTPATIENT_CLINIC_OR_DEPARTMENT_OTHER): Payer: Medicare Other

## 2014-04-04 ENCOUNTER — Ambulatory Visit (HOSPITAL_BASED_OUTPATIENT_CLINIC_OR_DEPARTMENT_OTHER): Payer: Medicare Other | Admitting: Oncology

## 2014-04-04 VITALS — BP 173/81 | HR 78 | Temp 98.0°F | Resp 18 | Ht 61.0 in | Wt 134.4 lb

## 2014-04-04 DIAGNOSIS — C180 Malignant neoplasm of cecum: Secondary | ICD-10-CM

## 2014-04-04 DIAGNOSIS — C189 Malignant neoplasm of colon, unspecified: Secondary | ICD-10-CM

## 2014-04-04 DIAGNOSIS — Z86718 Personal history of other venous thrombosis and embolism: Secondary | ICD-10-CM

## 2014-04-04 DIAGNOSIS — R197 Diarrhea, unspecified: Secondary | ICD-10-CM

## 2014-04-04 LAB — COMPREHENSIVE METABOLIC PANEL (CC13)
ALK PHOS: 39 U/L — AB (ref 40–150)
ALT: 11 U/L (ref 0–55)
AST: 23 U/L (ref 5–34)
Albumin: 3.6 g/dL (ref 3.5–5.0)
Anion Gap: 8 mEq/L (ref 3–11)
BILIRUBIN TOTAL: 0.22 mg/dL (ref 0.20–1.20)
BUN: 13.6 mg/dL (ref 7.0–26.0)
CO2: 27 mEq/L (ref 22–29)
CREATININE: 0.7 mg/dL (ref 0.6–1.1)
Calcium: 9.3 mg/dL (ref 8.4–10.4)
Chloride: 108 mEq/L (ref 98–109)
GLUCOSE: 86 mg/dL (ref 70–140)
Potassium: 4.5 mEq/L (ref 3.5–5.1)
SODIUM: 143 meq/L (ref 136–145)
TOTAL PROTEIN: 6.8 g/dL (ref 6.4–8.3)

## 2014-04-04 LAB — CBC WITH DIFFERENTIAL/PLATELET
BASO%: 1 % (ref 0.0–2.0)
Basophils Absolute: 0.1 10*3/uL (ref 0.0–0.1)
EOS ABS: 0.2 10*3/uL (ref 0.0–0.5)
EOS%: 3.1 % (ref 0.0–7.0)
HCT: 36.8 % (ref 34.8–46.6)
HGB: 12 g/dL (ref 11.6–15.9)
LYMPH%: 20.9 % (ref 14.0–49.7)
MCH: 32.2 pg (ref 25.1–34.0)
MCHC: 32.6 g/dL (ref 31.5–36.0)
MCV: 98.7 fL (ref 79.5–101.0)
MONO#: 0.5 10*3/uL (ref 0.1–0.9)
MONO%: 8.5 % (ref 0.0–14.0)
NEUT#: 4.1 10*3/uL (ref 1.5–6.5)
NEUT%: 66.5 % (ref 38.4–76.8)
PLATELETS: 258 10*3/uL (ref 145–400)
RBC: 3.73 10*6/uL (ref 3.70–5.45)
RDW: 20.6 % — ABNORMAL HIGH (ref 11.2–14.5)
WBC: 6.1 10*3/uL (ref 3.9–10.3)
lymph#: 1.3 10*3/uL (ref 0.9–3.3)

## 2014-04-04 MED ORDER — VANCOMYCIN HCL 125 MG PO CAPS: ORAL_CAPSULE | ORAL | Status: DC

## 2014-04-04 NOTE — Telephone Encounter (Signed)
Pt confirmed ov per 08/21 POF, gave pt AVS....KJ

## 2014-04-04 NOTE — Progress Notes (Signed)
CALLED [facility] [phone] SPOKE TO ELIZABETH AND HER VANCOMYCIN HAS BEEN APPROVED FROM 04/04/14 TO 04-18-14.  [id number]  CALLED [facility] [phone] AND IT WENT THRU.

## 2014-04-04 NOTE — Progress Notes (Signed)
CALLED NURSE AMY AND LET HER KNOW.

## 2014-04-04 NOTE — Progress Notes (Signed)
  [place] OFFICE PROGRESS NOTE   Diagnosis: Colon cancer, C. difficile colitis  INTERVAL HISTORY:   She returns as scheduled. She resumed capecitabine on 03/18/2014 and developed recurrent diarrhea beginning on 03/21/2014. She discontinued the capecitabine. A repeat stool sample on 03/26/2014 returned positive for the C. difficile toxin. She was placed back on Flagyl. She has taken Flagyl for the past week and continues to have 4 loose stools per day. No dominant pain, nausea, or fever. Good appetite. No hand or foot pain.  Objective:  Vital signs in last 24 hours:  Blood pressure 173/81, pulse 78, temperature 98 F (36.7 C), temperature source Oral, resp. rate 18, height _0  (1.549 m), weight 134 lb 6.4 oz (60.963 kg), SpO2 98.00%.    HEENT: No thrush or ulcers Resp: Lungs clear bilaterally Cardio: Regular rate and rhythm GI: The abdomen is soft and nontender, no hepatomegaly, no mass Vascular: No leg edema  Skin: Palms without erythema     Lab Results:  Lab Results  Component Value Date   WBC 6.1 04/04/2014   HGB 12.0 04/04/2014   HCT 36.8 04/04/2014   MCV 98.7 04/04/2014   PLT 258 04/04/2014   NEUTROABS 4.1 04/04/2014   Potassium 4.5    Medications: I have reviewed the patient's current medications.  Assessment/Plan: 1. Moderately differentiated adenocarcinoma of the cecum, stage III (T4a, N1), status post a laparoscopic assisted right colectomy 10/22/2013 The tumor returned microsatellite stable with normal mismatch repair protein expression  Cycle 1 adjuvant Xeloda 12/25/2013.  Cycle 2 adjuvant Xeloda 01/15/2014.  Cycle 3 adjuvant Xeloda 02/05/2014. Cycle 4 adjuvant Xeloda 03/18/2014, discontinued after 4 days secondary to diarrhea 2. Right ovary cystadenofibroma, status post a bilateral oophorectomy 10/22/2013 3. Remote history of cervical cancer. 4. Iron deficiency anemia-resolved. 5. Right calf deep vein thrombosis 10/24/2013-she completed 3  months of xarelto. 6. Indeterminate 12 mm hypermetabolic right pelvic density on the staging PET scan 10/04/2013-CT followup recommended per the GI tumor conference 11/13/2013. 7. Soft tissue implant overlying the posterior right liver on the CT 33/29/5188-CZY hypermetabolic on the PET scan 60/63/0160. 8. C. difficile colitis 03/05/2014. She completed a course of Flagyl, recurrent diarrhea beginning 03/21/2014, positive C. difficile toxin 03/26/2014-resumed Flagyl for planned 10 day course  Vancomycin beginning 04/04/2014   Disposition:  She appears well, but she continues to have 4 loose stools per day despite another week of Flagyl. I think it is likely the diarrhea is related to persistent C. difficile colitis as opposed to toxicity from capecitabine. She will discontinue Flagyl and began a slow vancomycin taper. She will contact us for increased diarrhea or new symptoms. Ms. Avery will return for an office visit 04/15/2014.  [name], MD  04/04/2014  10:56 AM

## 2014-04-07 ENCOUNTER — Telehealth: Payer: Self-pay | Admitting: *Deleted

## 2014-04-07 NOTE — Telephone Encounter (Signed)
Received message from pt re: new antibiotic; states she has not started yet because it was not approved by insurance.  This office call pt Rx - [facility] and they stated it was approved by pt insurance but they had to order and should be available for pick-up 04/08/14.  Called pt and left voice message to this effect and encouraged pt to call Rx in am for time to pick-up med and to call this office if questions.

## 2014-04-15 ENCOUNTER — Encounter: Payer: Self-pay | Admitting: *Deleted

## 2014-04-15 ENCOUNTER — Telehealth: Payer: Self-pay | Admitting: Oncology

## 2014-04-15 ENCOUNTER — Ambulatory Visit (HOSPITAL_BASED_OUTPATIENT_CLINIC_OR_DEPARTMENT_OTHER): Payer: Medicare Other | Admitting: Oncology

## 2014-04-15 VITALS — BP 150/92 | HR 81 | Temp 97.7°F | Resp 18 | Ht 61.0 in | Wt 135.8 lb

## 2014-04-15 DIAGNOSIS — Z86718 Personal history of other venous thrombosis and embolism: Secondary | ICD-10-CM

## 2014-04-15 DIAGNOSIS — Z8541 Personal history of malignant neoplasm of cervix uteri: Secondary | ICD-10-CM

## 2014-04-15 DIAGNOSIS — R197 Diarrhea, unspecified: Secondary | ICD-10-CM

## 2014-04-15 DIAGNOSIS — C182 Malignant neoplasm of ascending colon: Secondary | ICD-10-CM

## 2014-04-15 DIAGNOSIS — C180 Malignant neoplasm of cecum: Secondary | ICD-10-CM

## 2014-04-15 NOTE — Progress Notes (Signed)
  [place] OFFICE PROGRESS NOTE   Diagnosis: Colon cancer  INTERVAL HISTORY:   She returns as scheduled. The diarrhea has improved since starting vancomycin. She continues a slow vancomycin taper. She reports having 3-4 bowel movements per day. The bowel movements are formed. She has noted swelling in the left foot for the past 2 days. No leg swelling or pain. The swelling is better today.  Objective:  Vital signs in last 24 hours:  Blood pressure 150/92, pulse 81, temperature 97.7 F (36.5 C), temperature source Oral, resp. rate 18, height $RemoveBe'5\' 1"'cOdpWmqUc$  (1.549 m), weight 135 lb 12.8 oz (61.598 kg), SpO2 100.00%.    HEENT: No thrush or ulcer Resp: Lungs clear bilaterally Cardio: Regular rate and rhythm GI: No hepatomegaly Vascular: Trace edema at the left ankle. No leg edema or erythema. No leg tenderness  Skin: Palms without erythema, mild dry desquamation at the soles     Lab Results:  Lab Results  Component Value Date   WBC 6.1 04/04/2014   HGB 12.0 04/04/2014   HCT 36.8 04/04/2014   MCV 98.7 04/04/2014   PLT 258 04/04/2014   NEUTROABS 4.1 04/04/2014    Lab Results  Component Value Date   NA 143 04/04/2014    Lab Results  Component Value Date   CEA 1.7 10/17/2013     Medications: I have reviewed the patient's current medications.  Assessment/Plan: 1. Moderately differentiated adenocarcinoma of the cecum, stage III (T4a, N1), status post a laparoscopic assisted right colectomy 10/22/2013 The tumor returned microsatellite stable with normal mismatch repair protein expression  Cycle 1 adjuvant Xeloda 12/25/2013.  Cycle 2 adjuvant Xeloda 01/15/2014.  Cycle 3 adjuvant Xeloda 02/05/2014.  Cycle 4 adjuvant Xeloda 03/18/2014, discontinued after 4 days secondary to diarrhea Cycle 5 adjuvant Xeloda 04/16/2014, Xeloda dose reduced to 1000 mg in the a.m. and 500 mg in the p.m. 2. Right ovary cystadenofibroma, status post a bilateral oophorectomy  10/22/2013 3. Remote history of cervical cancer. 4. Iron deficiency anemia-resolved. 5. Right calf deep vein thrombosis 10/24/2013-she completed 3 months of xarelto. 6. Indeterminate 12 mm hypermetabolic right pelvic density on the staging PET scan 10/04/2013-CT followup recommended per the GI tumor conference 11/13/2013. 7. Soft tissue implant overlying the posterior right liver on the CT 62/95/2841-LKG hypermetabolic on the PET scan 40/05/2724. 8. C. difficile colitis 03/05/2014. She completed a course of Flagyl, recurrent diarrhea beginning 03/21/2014, positive C. difficile toxin 03/26/2014-resumed Flagyl for planned 10 day course Vancomycin beginning 04/08/2014    Disposition:  The diarrhea has improved. She understands it is unclear whether the diarrhea is related to C. difficile colitis or capecitabine. She will complete a vancomycin taper as prescribed. She will begin the next cycle of capecitabine 04/16/2014 with a dose reduction. She knows to discontinue capecitabine and contact us for recurrent diarrhea. Ms. Evans will return for an office visit in 2 weeks. She will call us if the left ankle/foot edema progresses.  [name], MD  04/15/2014  8:37 AM

## 2014-04-15 NOTE — Telephone Encounter (Signed)
Pt confirmed labs/ov per 09/01 POF, gave pt AVS..Marland KitchenKJ

## 2014-04-30 ENCOUNTER — Other Ambulatory Visit: Payer: Self-pay | Admitting: *Deleted

## 2014-04-30 ENCOUNTER — Telehealth: Payer: Self-pay | Admitting: Oncology

## 2014-04-30 ENCOUNTER — Other Ambulatory Visit (HOSPITAL_BASED_OUTPATIENT_CLINIC_OR_DEPARTMENT_OTHER): Payer: Medicare Other

## 2014-04-30 ENCOUNTER — Ambulatory Visit (HOSPITAL_BASED_OUTPATIENT_CLINIC_OR_DEPARTMENT_OTHER): Payer: Medicare Other | Admitting: Oncology

## 2014-04-30 VITALS — BP 162/87 | HR 66 | Temp 97.8°F | Resp 18 | Ht 61.0 in | Wt 136.1 lb

## 2014-04-30 DIAGNOSIS — C180 Malignant neoplasm of cecum: Secondary | ICD-10-CM

## 2014-04-30 DIAGNOSIS — C182 Malignant neoplasm of ascending colon: Secondary | ICD-10-CM

## 2014-04-30 DIAGNOSIS — Z86718 Personal history of other venous thrombosis and embolism: Secondary | ICD-10-CM

## 2014-04-30 LAB — CBC WITH DIFFERENTIAL/PLATELET
BASO%: 0.2 % (ref 0.0–2.0)
BASOS ABS: 0 10*3/uL (ref 0.0–0.1)
EOS ABS: 0.2 10*3/uL (ref 0.0–0.5)
EOS%: 3.2 % (ref 0.0–7.0)
HCT: 38.4 % (ref 34.8–46.6)
HGB: 12.5 g/dL (ref 11.6–15.9)
LYMPH%: 30.5 % (ref 14.0–49.7)
MCH: 32.1 pg (ref 25.1–34.0)
MCHC: 32.6 g/dL (ref 31.5–36.0)
MCV: 98.7 fL (ref 79.5–101.0)
MONO#: 0.4 10*3/uL (ref 0.1–0.9)
MONO%: 6.9 % (ref 0.0–14.0)
NEUT#: 3.1 10*3/uL (ref 1.5–6.5)
NEUT%: 59.2 % (ref 38.4–76.8)
PLATELETS: 221 10*3/uL (ref 145–400)
RBC: 3.89 10*6/uL (ref 3.70–5.45)
RDW: 15.4 % — ABNORMAL HIGH (ref 11.2–14.5)
WBC: 5.2 10*3/uL (ref 3.9–10.3)
lymph#: 1.6 10*3/uL (ref 0.9–3.3)

## 2014-04-30 LAB — COMPREHENSIVE METABOLIC PANEL (CC13)
ALT: 10 U/L (ref 0–55)
ANION GAP: 11 meq/L (ref 3–11)
AST: 23 U/L (ref 5–34)
Albumin: 3.7 g/dL (ref 3.5–5.0)
Alkaline Phosphatase: 44 U/L (ref 40–150)
BUN: 13.8 mg/dL (ref 7.0–26.0)
CALCIUM: 9 mg/dL (ref 8.4–10.4)
CHLORIDE: 106 meq/L (ref 98–109)
CO2: 24 meq/L (ref 22–29)
CREATININE: 0.8 mg/dL (ref 0.6–1.1)
GLUCOSE: 85 mg/dL (ref 70–140)
Potassium: 4.3 mEq/L (ref 3.5–5.1)
Sodium: 142 mEq/L (ref 136–145)
Total Bilirubin: 0.42 mg/dL (ref 0.20–1.20)
Total Protein: 7 g/dL (ref 6.4–8.3)

## 2014-04-30 MED ORDER — CAPECITABINE 500 MG PO TABS: ORAL_TABLET | ORAL | Status: DC

## 2014-04-30 NOTE — Telephone Encounter (Signed)
gv and printed appt sched and avs for pt for OCT. °

## 2014-04-30 NOTE — Telephone Encounter (Signed)
PER 9/16 POF MOVED 10/13 APPTS TO 10/6. LMONVM FOR PT AND MAILED SCHEDULE.

## 2014-04-30 NOTE — Telephone Encounter (Signed)
Pt notified that she has 1 re-fill remaining on her current script for Xeloda and to call few days prior to her next cycle that will begin 9/23 for re-fill.  Pt verbalized understanding of information.

## 2014-04-30 NOTE — Telephone Encounter (Signed)
p[er pof to sch pt appt-pt aware of sch appt

## 2014-04-30 NOTE — Progress Notes (Signed)
  [place] OFFICE PROGRESS NOTE   Diagnosis: Colon cancer  INTERVAL HISTORY:   She returns as scheduled. She resumed Xeloda 04/16/2014. She denies mouth sores, hand/foot pain, and the diarrhea continues to improve. She continues the vancomycin taper. She has one large formed bowel movement each morning and a few small bowel movements later in the day. No Frank diarrhea. No fever.  Objective:  Vital signs in last 24 hours:  Blood pressure 162/87, pulse 66, temperature 97.8 F (36.6 C), temperature source Oral, resp. rate 18, height $RemoveBe'5\' 1"'aLjGDCjhp$  (1.549 m), weight 136 lb 1.6 oz (61.735 kg).    HEENT: No thrush or ulcers Resp: Lungs clear bilaterally Cardio: Regular in rhythm GI: No hepatomegaly, nontender Vascular: No leg edema  Skin: Mild hyperpigmentation hands     Lab Results:  Lab Results  Component Value Date   WBC 5.2 04/30/2014   HGB 12.5 04/30/2014   HCT 38.4 04/30/2014   MCV 98.7 04/30/2014   PLT 221 04/30/2014   NEUTROABS 3.1 04/30/2014     Lab Results  Component Value Date   CEA 1.7 10/17/2013    Imaging:  No results found.  Medications: I have reviewed the patient's current medications.  Assessment/Plan: 1. Moderately differentiated adenocarcinoma of the cecum, stage III (T4a, N1), status post a laparoscopic assisted right colectomy 10/22/2013 The tumor returned microsatellite stable with normal mismatch repair protein expression  Cycle 1 adjuvant Xeloda 12/25/2013.  Cycle 2 adjuvant Xeloda 01/15/2014.  Cycle 3 adjuvant Xeloda 02/05/2014.  Cycle 4 adjuvant Xeloda 03/18/2014, discontinued after 4 days secondary to diarrhea  Cycle 5 adjuvant Xeloda 04/16/2014, Xeloda dose reduced to 1000 mg in the a.m. and 500 mg in the p.m. 2. Right ovary cystadenofibroma, status post a bilateral oophorectomy 10/22/2013 3. Remote history of cervical cancer. 4. Iron deficiency anemia-resolved. 5. Right calf deep vein thrombosis 10/24/2013-she completed 3 months  of xarelto. 6. Indeterminate 12 mm hypermetabolic right pelvic density on the staging PET scan 10/04/2013-CT followup recommended per the GI tumor conference 11/13/2013. 7. Soft tissue implant overlying the posterior right liver on the CT 42/35/3614-ERX hypermetabolic on the PET scan 54/00/8676. 8. C. difficile colitis 03/05/2014. She completed a course of Flagyl, recurrent diarrhea beginning 03/21/2014, positive C. difficile toxin 03/26/2014-resumed Flagyl for planned 10 day course Vancomycin beginning 04/08/2014   Disposition:  She appears to be tolerating the Xeloda well at the current dose. She will complete the planned vancomycin taper. The plan is to complete a total of 8 cycles of adjuvant Xeloda. She will contact us for recurrent diarrhea. Cynthia [name] will return for an office visit after the next cycle of Xeloda.  [name], MD  04/30/2014  12:40 PM

## 2014-05-02 ENCOUNTER — Telehealth: Payer: Self-pay | Admitting: *Deleted

## 2014-05-02 MED ORDER — CAPECITABINE 500 MG PO TABS: ORAL_TABLET | ORAL | Status: DC

## 2014-05-02 NOTE — Telephone Encounter (Signed)
Message from [facility] to clarify pt's Xeloda instructions. They have refill on file for Xeloda 1,500 mg BID and received a message to refill. Returned call to pharmacy with verbal order for Xeloda 1,000 mg QAM 500 mg QPM for 14 days then 7 days off. Pt to start 05/07/14.

## 2014-05-05 ENCOUNTER — Other Ambulatory Visit: Payer: Self-pay | Admitting: Oncology

## 2014-05-05 DIAGNOSIS — C182 Malignant neoplasm of ascending colon: Secondary | ICD-10-CM

## 2014-05-13 ENCOUNTER — Encounter: Payer: Self-pay | Admitting: *Deleted

## 2014-05-19 ENCOUNTER — Other Ambulatory Visit: Payer: 59

## 2014-05-19 ENCOUNTER — Ambulatory Visit: Payer: 59 | Admitting: Oncology

## 2014-05-20 ENCOUNTER — Ambulatory Visit (HOSPITAL_BASED_OUTPATIENT_CLINIC_OR_DEPARTMENT_OTHER): Payer: Medicare Other | Admitting: Nurse Practitioner

## 2014-05-20 ENCOUNTER — Telehealth: Payer: Self-pay | Admitting: Nurse Practitioner

## 2014-05-20 ENCOUNTER — Other Ambulatory Visit (HOSPITAL_BASED_OUTPATIENT_CLINIC_OR_DEPARTMENT_OTHER): Payer: Medicare Other

## 2014-05-20 VITALS — BP 155/89 | HR 87 | Temp 98.5°F | Resp 15 | Wt 137.3 lb

## 2014-05-20 DIAGNOSIS — C180 Malignant neoplasm of cecum: Secondary | ICD-10-CM

## 2014-05-20 DIAGNOSIS — Z8541 Personal history of malignant neoplasm of cervix uteri: Secondary | ICD-10-CM

## 2014-05-20 DIAGNOSIS — C189 Malignant neoplasm of colon, unspecified: Secondary | ICD-10-CM

## 2014-05-20 DIAGNOSIS — Z86718 Personal history of other venous thrombosis and embolism: Secondary | ICD-10-CM

## 2014-05-20 DIAGNOSIS — C182 Malignant neoplasm of ascending colon: Secondary | ICD-10-CM

## 2014-05-20 LAB — CBC WITH DIFFERENTIAL/PLATELET
BASO%: 0.8 % (ref 0.0–2.0)
Basophils Absolute: 0 10*3/uL (ref 0.0–0.1)
EOS ABS: 0.1 10*3/uL (ref 0.0–0.5)
EOS%: 2 % (ref 0.0–7.0)
HEMATOCRIT: 38.9 % (ref 34.8–46.6)
HGB: 12.5 g/dL (ref 11.6–15.9)
LYMPH%: 26.4 % (ref 14.0–49.7)
MCH: 31.5 pg (ref 25.1–34.0)
MCHC: 32 g/dL (ref 31.5–36.0)
MCV: 98.3 fL (ref 79.5–101.0)
MONO#: 0.4 10*3/uL (ref 0.1–0.9)
MONO%: 6.2 % (ref 0.0–14.0)
NEUT%: 64.6 % (ref 38.4–76.8)
NEUTROS ABS: 3.7 10*3/uL (ref 1.5–6.5)
PLATELETS: 261 10*3/uL (ref 145–400)
RBC: 3.96 10*6/uL (ref 3.70–5.45)
RDW: 16.3 % — ABNORMAL HIGH (ref 11.2–14.5)
WBC: 5.8 10*3/uL (ref 3.9–10.3)
lymph#: 1.5 10*3/uL (ref 0.9–3.3)

## 2014-05-20 NOTE — Telephone Encounter (Signed)
, °

## 2014-05-20 NOTE — Progress Notes (Signed)
  [place] OFFICE PROGRESS NOTE   Diagnosis:  Colon cancer  INTERVAL HISTORY:   Cynthia [name] returns as scheduled. She completed cycle 6 adjuvant Xeloda beginning 05/07/2014. She denies nausea/vomiting. No mouth sores. No hand or foot pain or redness. She is not having loose stools. She estimates 2 formed bowel movements a day. She completes the vancomycin taper tomorrow. She denies pain.  Objective:  Vital signs in last 24 hours:  Blood pressure 155/89, pulse 87, temperature 98.5 F (36.9 C), temperature source Oral, resp. rate 15, weight 137 lb 4.8 oz (62.279 kg).    HEENT: No thrush or ulcers. Resp: Lungs clear bilaterally. Cardio: Regular rate and rhythm. GI: Abdomen soft and nontender. No hepatomegaly. Vascular: No leg edema. Calves soft and nontender.  Skin: Palms with mild erythema. No skin breakdown.    Lab Results:  Lab Results  Component Value Date   WBC 5.8 05/20/2014   HGB 12.5 05/20/2014   HCT 38.9 05/20/2014   MCV 98.3 05/20/2014   PLT 261 05/20/2014   NEUTROABS 3.7 05/20/2014    Imaging:  No results found.  Medications: I have reviewed the patient's current medications.  Assessment/Plan: 1. Moderately differentiated adenocarcinoma of the cecum, stage III (T4a, N1), status post a laparoscopic assisted right colectomy 10/22/2013 The tumor returned microsatellite stable with normal mismatch repair protein expression  Cycle 1 adjuvant Xeloda 12/25/2013.  Cycle 2 adjuvant Xeloda 01/15/2014.  Cycle 3 adjuvant Xeloda 02/05/2014.  Cycle 4 adjuvant Xeloda 03/18/2014, discontinued after 4 days secondary to diarrhea  Cycle 5 adjuvant Xeloda 04/16/2014, Xeloda dose reduced to 1000 mg in the a.m. and 500 mg in the p.m. Cycle 6 adjuvant Xeloda 05/07/2014. 2. Right ovary cystadenofibroma, status post a bilateral oophorectomy 10/22/2013 3. Remote history of cervical cancer. 4. Iron deficiency anemia-resolved. 5. Right calf deep vein thrombosis  10/24/2013-she completed 3 months of xarelto. 6. Indeterminate 12 mm hypermetabolic right pelvic density on the staging PET scan 10/04/2013-CT followup recommended per the GI tumor conference 11/13/2013. 7. Soft tissue implant overlying the posterior right liver on the CT 13/03/6577-ION hypermetabolic on the PET scan 62/95/2841. 8. C. difficile colitis 03/05/2014. She completed a course of Flagyl, recurrent diarrhea beginning 03/21/2014, positive C. difficile toxin 03/26/2014-resumed Flagyl for planned 10 day course Vancomycin beginning 04/08/2014    Disposition: Ms. Manny appears stable. She has completed 6 cycles of adjuvant Xeloda. Plan to proceed with cycle 7 beginning 05/28/2014. She will return for a followup visit on 06/11/2014. She will contact the office in the interim with any problems. We specifically discussed recurrent diarrhea.    Ned Card ANP/GNP-BC   05/20/2014  2:46 PM

## 2014-05-21 ENCOUNTER — Telehealth: Payer: Self-pay | Admitting: *Deleted

## 2014-05-21 ENCOUNTER — Other Ambulatory Visit: Payer: Self-pay | Admitting: Oncology

## 2014-05-21 DIAGNOSIS — C189 Malignant neoplasm of colon, unspecified: Secondary | ICD-10-CM

## 2014-05-21 LAB — CEA: CEA: 1.7 ng/mL (ref 0.0–5.0)

## 2014-05-21 NOTE — Telephone Encounter (Signed)
Left message on voicemail informing pt of normal labs.  

## 2014-05-21 NOTE — Telephone Encounter (Signed)
Message copied by Brien Few on Wed May 21, 2014  9:15 AM ------      Message from: [name] B      Created: Wed May 21, 2014  8:26 AM       Please call patient, cea is normal ------

## 2014-05-27 ENCOUNTER — Ambulatory Visit: Payer: Medicare Other | Admitting: Nurse Practitioner

## 2014-05-27 ENCOUNTER — Other Ambulatory Visit: Payer: Medicare Other

## 2014-06-11 ENCOUNTER — Ambulatory Visit (HOSPITAL_BASED_OUTPATIENT_CLINIC_OR_DEPARTMENT_OTHER): Payer: Medicare Other | Admitting: Nurse Practitioner

## 2014-06-11 ENCOUNTER — Telehealth: Payer: Self-pay | Admitting: Nurse Practitioner

## 2014-06-11 ENCOUNTER — Other Ambulatory Visit (HOSPITAL_BASED_OUTPATIENT_CLINIC_OR_DEPARTMENT_OTHER): Payer: Medicare Other

## 2014-06-11 VITALS — BP 166/88 | HR 83 | Temp 97.9°F | Resp 18 | Ht 61.0 in | Wt 133.9 lb

## 2014-06-11 DIAGNOSIS — C180 Malignant neoplasm of cecum: Secondary | ICD-10-CM

## 2014-06-11 DIAGNOSIS — C189 Malignant neoplasm of colon, unspecified: Secondary | ICD-10-CM

## 2014-06-11 DIAGNOSIS — Z86718 Personal history of other venous thrombosis and embolism: Secondary | ICD-10-CM

## 2014-06-11 LAB — CBC WITH DIFFERENTIAL/PLATELET
BASO%: 0.7 % (ref 0.0–2.0)
Basophils Absolute: 0 10*3/uL (ref 0.0–0.1)
EOS%: 1.3 % (ref 0.0–7.0)
Eosinophils Absolute: 0.1 10*3/uL (ref 0.0–0.5)
HEMATOCRIT: 40.6 % (ref 34.8–46.6)
HGB: 13 g/dL (ref 11.6–15.9)
LYMPH%: 23.4 % (ref 14.0–49.7)
MCH: 31.1 pg (ref 25.1–34.0)
MCHC: 32 g/dL (ref 31.5–36.0)
MCV: 97.2 fL (ref 79.5–101.0)
MONO#: 0.5 10*3/uL (ref 0.1–0.9)
MONO%: 8.8 % (ref 0.0–14.0)
NEUT#: 3.8 10*3/uL (ref 1.5–6.5)
NEUT%: 65.8 % (ref 38.4–76.8)
PLATELETS: 224 10*3/uL (ref 145–400)
RBC: 4.17 10*6/uL (ref 3.70–5.45)
RDW: 17 % — ABNORMAL HIGH (ref 11.2–14.5)
WBC: 5.8 10*3/uL (ref 3.9–10.3)
lymph#: 1.4 10*3/uL (ref 0.9–3.3)

## 2014-06-11 LAB — COMPREHENSIVE METABOLIC PANEL (CC13)
ALT: 13 U/L (ref 0–55)
ANION GAP: 8 meq/L (ref 3–11)
AST: 18 U/L (ref 5–34)
Albumin: 3.9 g/dL (ref 3.5–5.0)
Alkaline Phosphatase: 49 U/L (ref 40–150)
BILIRUBIN TOTAL: 0.46 mg/dL (ref 0.20–1.20)
BUN: 12.3 mg/dL (ref 7.0–26.0)
CO2: 28 mEq/L (ref 22–29)
CREATININE: 0.8 mg/dL (ref 0.6–1.1)
Calcium: 9.5 mg/dL (ref 8.4–10.4)
Chloride: 107 mEq/L (ref 98–109)
Glucose: 82 mg/dl (ref 70–140)
Potassium: 4.1 mEq/L (ref 3.5–5.1)
Sodium: 142 mEq/L (ref 136–145)
Total Protein: 7.1 g/dL (ref 6.4–8.3)

## 2014-06-11 NOTE — Telephone Encounter (Signed)
Gave avs. Will mail cal w/ appt.

## 2014-06-11 NOTE — Progress Notes (Signed)
  [place] OFFICE PROGRESS NOTE   Diagnosis:  Colon cancer  INTERVAL HISTORY:   [name] returns as scheduled. She completed cycle 7 adjuvant Xeloda beginning 05/28/2014. She denies nausea/vomiting. No mouth sores. No diarrhea. No hand or foot pain or redness. She has a good appetite. She denies pain.  Objective:  Vital signs in last 24 hours:  Blood pressure 166/88, pulse 83, temperature 97.9 F (36.6 C), temperature source Oral, resp. rate 18, height $RemoveBe'5\' 1"'vjqFYtMrJ$  (1.549 m), weight 133 lb 14.4 oz (60.737 kg), SpO2 100.00%.    HEENT: No thrush or ulcers. Resp: Lungs clear bilaterally. Cardio: Regular rate and rhythm. GI: Abdomen soft and nontender. No hepatomegaly. Vascular: No leg edema.  Skin: Palms with mild erythema. No skin breakdown.    Lab Results:  Lab Results  Component Value Date   WBC 5.8 06/11/2014   HGB 13.0 06/11/2014   HCT 40.6 06/11/2014   MCV 97.2 06/11/2014   PLT 224 06/11/2014   NEUTROABS 3.8 06/11/2014    Imaging:  No results found.  Medications: I have reviewed the patient's current medications.  Assessment/Plan: 1. Moderately differentiated adenocarcinoma of the cecum, stage III (T4a, N1), status post a laparoscopic assisted right colectomy 10/22/2013 The tumor returned microsatellite stable with normal mismatch repair protein expression  Cycle 1 adjuvant Xeloda 12/25/2013.  Cycle 2 adjuvant Xeloda 01/15/2014.  Cycle 3 adjuvant Xeloda 02/05/2014.  Cycle 4 adjuvant Xeloda 03/18/2014, discontinued after 4 days secondary to diarrhea  Cycle 5 adjuvant Xeloda 04/16/2014, Xeloda dose reduced to 1000 mg in the a.m. and 500 mg in the p.m.  Cycle 6 adjuvant Xeloda 05/07/2014. Cycle 7 adjuvant Xeloda 05/28/2014. Cycle 8 adjuvant Xeloda 06/18/2014. 2. Right ovary cystadenofibroma, status post a bilateral oophorectomy 10/22/2013 3. Remote history of cervical cancer. 4. Iron deficiency anemia-resolved. 5. Right calf deep vein  thrombosis 10/24/2013-she completed 3 months of xarelto. 6. Indeterminate 12 mm hypermetabolic right pelvic density on the staging PET scan 10/04/2013-CT followup recommended per the GI tumor conference 11/13/2013. 7. Soft tissue implant overlying the posterior right liver on the CT 93/26/7124-PYK hypermetabolic on the PET scan 99/83/3825. 8. C. difficile colitis 03/05/2014. She completed a course of Flagyl, recurrent diarrhea beginning 03/21/2014, positive C. difficile toxin 03/26/2014-resumed Flagyl for planned 10 day course.  Vancomycin beginning 04/08/2014.  Taper complete 05/21/2014.   Disposition: Cynthia [name] appears stable. She has completed 7 cycles of adjuvant Xeloda. Plan to proceed with the eighth and final cycle beginning 06/18/2014.  She will return for a followup visit on 07/18/2014 with CT scans approximately one week prior. She will contact the office in the interim with any problems.  Plan reviewed with Dr. Benay Spice.    Ned Card ANP/GNP-BC   06/11/2014  12:23 PM

## 2014-07-14 ENCOUNTER — Other Ambulatory Visit: Payer: Medicare Other

## 2014-07-14 ENCOUNTER — Ambulatory Visit (HOSPITAL_COMMUNITY)
Admission: RE | Admit: 2014-07-14 | Discharge: 2014-07-14 | Disposition: A | Discharge status: Home / Self Care | Payer: Medicare Other | Source: Ambulatory Visit | Attending: Nurse Practitioner | Admitting: Nurse Practitioner

## 2014-07-14 ENCOUNTER — Other Ambulatory Visit (HOSPITAL_BASED_OUTPATIENT_CLINIC_OR_DEPARTMENT_OTHER): Payer: Medicare Other

## 2014-07-14 DIAGNOSIS — Z90710 Acquired absence of both cervix and uterus: Secondary | ICD-10-CM | POA: Insufficient documentation

## 2014-07-14 DIAGNOSIS — Z9049 Acquired absence of other specified parts of digestive tract: Secondary | ICD-10-CM | POA: Insufficient documentation

## 2014-07-14 DIAGNOSIS — C180 Malignant neoplasm of cecum: Secondary | ICD-10-CM

## 2014-07-14 DIAGNOSIS — C189 Malignant neoplasm of colon, unspecified: Secondary | ICD-10-CM

## 2014-07-14 DIAGNOSIS — R911 Solitary pulmonary nodule: Secondary | ICD-10-CM | POA: Diagnosis not present

## 2014-07-14 DIAGNOSIS — K7689 Other specified diseases of liver: Secondary | ICD-10-CM | POA: Diagnosis not present

## 2014-07-14 LAB — CBC WITH DIFFERENTIAL/PLATELET
BASO%: 0.2 % (ref 0.0–2.0)
BASOS ABS: 0 10*3/uL (ref 0.0–0.1)
EOS ABS: 0.1 10*3/uL (ref 0.0–0.5)
EOS%: 2.3 % (ref 0.0–7.0)
HCT: 40.5 % (ref 34.8–46.6)
HEMOGLOBIN: 13.3 g/dL (ref 11.6–15.9)
LYMPH%: 25.1 % (ref 14.0–49.7)
MCH: 31.4 pg (ref 25.1–34.0)
MCHC: 32.8 g/dL (ref 31.5–36.0)
MCV: 95.5 fL (ref 79.5–101.0)
MONO#: 0.4 10*3/uL (ref 0.1–0.9)
MONO%: 6.2 % (ref 0.0–14.0)
NEUT%: 66.2 % (ref 38.4–76.8)
NEUTROS ABS: 4 10*3/uL (ref 1.5–6.5)
Platelets: 220 10*3/uL (ref 145–400)
RBC: 4.24 10*6/uL (ref 3.70–5.45)
RDW: 17.3 % — AB (ref 11.2–14.5)
WBC: 6 10*3/uL (ref 3.9–10.3)
lymph#: 1.5 10*3/uL (ref 0.9–3.3)

## 2014-07-14 LAB — COMPREHENSIVE METABOLIC PANEL (CC13)
ALBUMIN: 4.1 g/dL (ref 3.5–5.0)
ALK PHOS: 55 U/L (ref 40–150)
ALT: 12 U/L (ref 0–55)
AST: 26 U/L (ref 5–34)
Anion Gap: 10 mEq/L (ref 3–11)
BUN: 12.2 mg/dL (ref 7.0–26.0)
CALCIUM: 10 mg/dL (ref 8.4–10.4)
CO2: 27 mEq/L (ref 22–29)
Chloride: 104 mEq/L (ref 98–109)
Creatinine: 0.8 mg/dL (ref 0.6–1.1)
GLUCOSE: 97 mg/dL (ref 70–140)
POTASSIUM: 4.2 meq/L (ref 3.5–5.1)
Sodium: 141 mEq/L (ref 136–145)
TOTAL PROTEIN: 7.5 g/dL (ref 6.4–8.3)
Total Bilirubin: 0.6 mg/dL (ref 0.20–1.20)

## 2014-07-14 MED ORDER — IOHEXOL 300 MG/ML  SOLN
100.0000 mL | Freq: Once | INTRAMUSCULAR | Status: AC | PRN
  Administered 2014-07-14: 100 mL via INTRAVENOUS

## 2014-07-15 LAB — CEA: CEA: 2.5 ng/mL (ref 0.0–5.0)

## 2014-07-18 ENCOUNTER — Telehealth: Payer: Self-pay | Admitting: Oncology

## 2014-07-18 ENCOUNTER — Other Ambulatory Visit: Payer: Medicare Other

## 2014-07-18 ENCOUNTER — Ambulatory Visit (HOSPITAL_BASED_OUTPATIENT_CLINIC_OR_DEPARTMENT_OTHER): Payer: Medicare Other | Admitting: Oncology

## 2014-07-18 VITALS — BP 177/87 | HR 75 | Temp 97.8°F | Resp 18 | Ht 61.0 in | Wt 135.7 lb

## 2014-07-18 DIAGNOSIS — C189 Malignant neoplasm of colon, unspecified: Secondary | ICD-10-CM

## 2014-07-18 DIAGNOSIS — C180 Malignant neoplasm of cecum: Secondary | ICD-10-CM

## 2014-07-18 NOTE — Telephone Encounter (Signed)
Gave avs & cal for March 2016. °

## 2014-07-18 NOTE — Progress Notes (Addendum)
Cynthia [name] OFFICE PROGRESS NOTE   Diagnosis: Colon cancer  INTERVAL HISTORY:   She returns as scheduled. She began cycle 8 of adjuvant Xeloda 06/18/2014. She reports tolerating the Xeloda well. No mouth sores, diarrhea, or hand/foot pain. She feels well. Good appetite.  Objective:  Vital signs in last 24 hours:  Blood pressure 177/87, pulse 75, temperature 97.8 F (36.6 C), temperature source Oral, resp. rate 18, height _0  (1.549 m), weight 135 lb 11.2 oz (61.553 kg).    HEENT: Thrush or ulcers Lymphatics: No cervical, supra-clavicular, axillary, or inguinal nodes Resp: Lungs clear bilaterally Cardio: Regular rate and rhythm GI: No hepatomegaly, nontender, no mass Vascular: No leg edema   Lab Results:  Lab Results  Component Value Date   WBC 6.0 07/14/2014   HGB 13.3 07/14/2014   HCT 40.5 07/14/2014   MCV 95.5 07/14/2014   PLT 220 07/14/2014   NEUTROABS 4.0 07/14/2014     Lab Results  Component Value Date   CEA 2.5 07/14/2014    Imaging:  Ct Chest W Contrast  07/14/2014   CLINICAL DATA:  Followup stage III colon carcinoma. Status post right colectomy and adjuvant chemotherapy.  EXAM: CT CHEST, ABDOMEN, AND PELVIS WITH CONTRAST  TECHNIQUE: Multidetector CT imaging of the chest, abdomen and pelvis was performed following the standard protocol during bolus administration of intravenous contrast.  CONTRAST:  172m OMNIPAQUE IOHEXOL 300 MG/ML  SOLN  COMPARISON:  PET-CT on 10/04/13  FINDINGS: CT CHEST FINDINGS  Mediastinum/Hilar Regions: No masses or pathologically enlarged lymph nodes identified.  Other Thoracic Lymphadenopathy:  None.  Lungs: 4 mm pulmonary nodule in the left midlung the adjacent to the fissure on image 28 is stable and most likely represents a pulmonary lymph node. No new or enlarging pulmonary nodules or masses identified.  Pleura:  No evidence of effusion or mass.  Vascular/Cardiac:  No acute findings identified.  Other:  None.   Musculoskeletal:  No suspicious bone lesions identified.  CT ABDOMEN AND PELVIS FINDINGS  Hepatobiliary: Multiple cysts in the left hepatic lobe are stable. No liver masses are identified. Gallbladder is unremarkable.  Pancreas: No mass, inflammatory changes, or other parenchymal abnormality identified.  Spleen:  Within normal limits in size and appearance.  Adrenal Glands:  No mass identified.  Kidneys/Urinary Tract: No masses identified. No evidence of hydronephrosis.  Stomach/Bowel/Peritoneum: Postop changes seen from interval right colectomy. No residual wall thickening or soft tissue mass seen involving bowel. No evidence of bowel obstruction.  Vascular/Lymphatic: No pathologically enlarged lymph nodes identified. No other significant abnormality identified.  Reproductive: Prior hysterectomy. Previous seen cystic lesion in the right adnexa is no longer visualized.  Other:  None.  Musculoskeletal:  No suspicious bone lesions identified.  IMPRESSION: No radiographic evidence of recurrent or metastatic carcinoma within the chest, abdomen, or pelvis. No other acute findings identified.   Electronically Signed   By: JEarle GellM.D.   On: 07/14/2014 15:15   Ct Abdomen Pelvis W Contrast  07/14/2014   CLINICAL DATA:  Followup stage III colon carcinoma. Status post right colectomy and adjuvant chemotherapy.  EXAM: CT CHEST, ABDOMEN, AND PELVIS WITH CONTRAST  TECHNIQUE: Multidetector CT imaging of the chest, abdomen and pelvis was performed following the standard protocol during bolus administration of intravenous contrast.  CONTRAST:  1014mOMNIPAQUE IOHEXOL 300 MG/ML  SOLN  COMPARISON:  PET-CT on 10/04/13  FINDINGS: CT CHEST FINDINGS  Mediastinum/Hilar Regions: No masses or pathologically enlarged lymph nodes identified.  Other Thoracic Lymphadenopathy:  None.  Lungs: 4 mm pulmonary nodule in the left midlung the adjacent to the fissure on image 28 is stable and most likely represents a pulmonary lymph node. No  new or enlarging pulmonary nodules or masses identified.  Pleura:  No evidence of effusion or mass.  Vascular/Cardiac:  No acute findings identified.  Other:  None.  Musculoskeletal:  No suspicious bone lesions identified.  CT ABDOMEN AND PELVIS FINDINGS  Hepatobiliary: Multiple cysts in the left hepatic lobe are stable. No liver masses are identified. Gallbladder is unremarkable.  Pancreas: No mass, inflammatory changes, or other parenchymal abnormality identified.  Spleen:  Within normal limits in size and appearance.  Adrenal Glands:  No mass identified.  Kidneys/Urinary Tract: No masses identified. No evidence of hydronephrosis.  Stomach/Bowel/Peritoneum: Postop changes seen from interval right colectomy. No residual wall thickening or soft tissue mass seen involving bowel. No evidence of bowel obstruction.  Vascular/Lymphatic: No pathologically enlarged lymph nodes identified. No other significant abnormality identified.  Reproductive: Prior hysterectomy. Previous seen cystic lesion in the right adnexa is no longer visualized.  Other:  None.  Musculoskeletal:  No suspicious bone lesions identified.  IMPRESSION: No radiographic evidence of recurrent or metastatic carcinoma within the chest, abdomen, or pelvis. No other acute findings identified.   Electronically Signed   By: Earle Gell M.D.   On: 07/14/2014 15:15    Medications: I have reviewed the patient's current medications.  Assessment/Plan: 1. Moderately differentiated adenocarcinoma of the cecum, stage III (T4a, N1), status post a laparoscopic assisted right colectomy 10/22/2013  The tumor returned microsatellite stable with normal mismatch repair protein expression   Cycle 1 adjuvant Xeloda 12/25/2013.   Cycle 2 adjuvant Xeloda 01/15/2014.   Cycle 3 adjuvant Xeloda 02/05/2014.   Cycle 4 adjuvant Xeloda 03/18/2014, discontinued after 4 days secondary to diarrhea   Cycle 5 adjuvant Xeloda 04/16/2014, Xeloda dose reduced to 1000 mg  in the a.m. and 500 mg in the p.m.   Cycle 6 adjuvant Xeloda 05/07/2014.  Cycle 7 adjuvant Xeloda 05/28/2014.  Cycle 8 adjuvant Xeloda 06/18/2014.  Restaging CTs on 07/14/2014 with no evidence of metastatic colon cancer 2. Right ovary cystadenofibroma, status post a bilateral oophorectomy 10/22/2013 3. Remote history of cervical cancer. 4. Iron deficiency anemia-resolved. 5. Right calf deep vein thrombosis 10/24/2013-she completed 3 months of xarelto. 6. Indeterminate 12 mm hypermetabolic right pelvic density on the staging PET scan 10/04/2013-CT followup recommended per the GI tumor conference 11/13/2013. No longer visualized on a CT 07/14/2014. 7. Soft tissue implant overlying the posterior right liver on the CT 54/65/0354-SFK hypermetabolic on the PET scan 81/27/5170. 8. C. difficile colitis 03/05/2014. She completed a course of Flagyl, recurrent diarrhea beginning 03/21/2014, positive C. difficile toxin 03/26/2014-resumed Flagyl for planned 10 day course.  Vancomycin beginning 04/08/2014.  Taper complete 05/21/2014.   Disposition:  Cynthia [name] has completed adjuvant therapy. The C. difficile colitis has resolved. The restaging CT reveals no evidence of recurrent colon cancer.  She will return for an office visit and CEA in 3 months. We will refer her to Dr. Michail Sermon for a one-year surveillance colonoscopy. She will see Dr. Stephanie Acre for management of hypertension.  [name], MD  07/18/2014  11:12 AM   I reviewed the 07/14/2014 CT with a radiologist at [facility]. The right pelvic hypermetabolic soft tissue lesion noted on the PET/CT  is not visualized on the current study.

## 2014-07-21 ENCOUNTER — Ambulatory Visit: Payer: Medicare Other | Admitting: Oncology

## 2014-10-16 ENCOUNTER — Telehealth: Payer: Self-pay | Admitting: Oncology

## 2014-10-16 ENCOUNTER — Ambulatory Visit (HOSPITAL_BASED_OUTPATIENT_CLINIC_OR_DEPARTMENT_OTHER): Payer: Medicare Other | Admitting: Oncology

## 2014-10-16 ENCOUNTER — Other Ambulatory Visit (HOSPITAL_BASED_OUTPATIENT_CLINIC_OR_DEPARTMENT_OTHER): Payer: Medicare Other

## 2014-10-16 VITALS — BP 186/95 | HR 78 | Temp 97.8°F | Resp 20 | Ht 61.0 in | Wt 135.1 lb

## 2014-10-16 DIAGNOSIS — Z8541 Personal history of malignant neoplasm of cervix uteri: Secondary | ICD-10-CM

## 2014-10-16 DIAGNOSIS — C180 Malignant neoplasm of cecum: Secondary | ICD-10-CM

## 2014-10-16 DIAGNOSIS — C189 Malignant neoplasm of colon, unspecified: Secondary | ICD-10-CM

## 2014-10-16 DIAGNOSIS — Z86718 Personal history of other venous thrombosis and embolism: Secondary | ICD-10-CM

## 2014-10-16 LAB — CEA: CEA: 2.3 ng/mL (ref 0.0–5.0)

## 2014-10-16 NOTE — Progress Notes (Signed)
  [facility] OFFICE PROGRESS NOTE   Diagnosis: Colon cancer  INTERVAL HISTORY:   She returns as scheduled. She feels well. No diarrhea. She reports the blood pressure is lower when she takes it at home. She underwent a colonoscopy by Dr. Michail Sermon last month (we do not have the report available today)   Objective:  Vital signs in last 24 hours:  Blood pressure 186/95, pulse 78, temperature 97.8 F (36.6 C), temperature source Oral, resp. rate 20, height $RemoveBe'5\' 1"'MLAssPIoo$  (1.549 m), weight 135 lb 1.6 oz (61.281 kg).    HEENT: Neck without mass Lymphatics: No cervical, supra-clavicular, axillary, or inguinal nodes Resp: Lungs clear bilaterally Cardio: Regular rate and rhythm GI: No hepatosplenomegaly, nontender, no mass Vascular: No leg edema   Lab Results  Component Value Date   CEA 2.5 07/14/2014    Medications: I have reviewed the patient's current medications.  Assessment/Plan: 1. Moderately differentiated adenocarcinoma of the cecum, stage III (T4a, N1), status post a laparoscopic assisted right colectomy 10/22/2013  The tumor returned microsatellite stable with normal mismatch repair protein expression   Cycle 1 adjuvant Xeloda 12/25/2013.   Cycle 2 adjuvant Xeloda 01/15/2014.   Cycle 3 adjuvant Xeloda 02/05/2014.   Cycle 4 adjuvant Xeloda 03/18/2014, discontinued after 4 days secondary to diarrhea   Cycle 5 adjuvant Xeloda 04/16/2014, Xeloda dose reduced to 1000 mg in the a.m. and 500 mg in the p.m.   Cycle 6 adjuvant Xeloda 05/07/2014.  Cycle 7 adjuvant Xeloda 05/28/2014.  Cycle 8 adjuvant Xeloda 06/18/2014.  Restaging CTs on 07/14/2014 with no evidence of metastatic colon cancer 2. Right ovary cystadenofibroma, status post a bilateral oophorectomy 10/22/2013 3. Remote history of cervical cancer. 4. Iron deficiency anemia-resolved. 5. Right calf deep vein thrombosis 10/24/2013-she completed 3 months of xarelto. 6. Indeterminate 12 mm  hypermetabolic right pelvic density on the staging PET scan 10/04/2013-CT followup recommended per the GI tumor conference 11/13/2013. No longer visualized on a CT 07/14/2014. 7. Soft tissue implant overlying the posterior right liver on the CT 18/48/5927-GFR hypermetabolic on the PET scan 43/20/0379. 8. C. difficile colitis 03/05/2014. She completed a course of Flagyl, recurrent diarrhea beginning 03/21/2014, positive C. difficile toxin 03/26/2014-resumed Flagyl for planned 10 day course.  Vancomycin beginning 04/08/2014.  Taper complete 05/21/2014.   Disposition:  She remains in clinical remission from colon cancer. We will follow-up on the CEA from today. She will return for an office visit and CEA in 6 months. We will schedule surveillance CT scans for early December 2016. We will get the colonoscopy report from Dr. Michail Sermon.  Ms. Blum will schedule an appointment with Dr. Stephanie Acre to evaluate the hypertension.  [name], MD  10/16/2014  10:52 AM

## 2014-10-16 NOTE — Telephone Encounter (Addendum)
Labs/ov per 03/03 POF scheduled..... KJ, pt was given schedule by another scheduler

## 2014-10-16 NOTE — Telephone Encounter (Signed)
gv adn printed appt sched and avs for pt for Sept.... °

## 2014-10-17 ENCOUNTER — Telehealth: Payer: Self-pay | Admitting: *Deleted

## 2014-10-17 NOTE — Telephone Encounter (Signed)
Patient called back to Triage.  Let her know that Dr. Benay Spice wanted her to know that her CEA is normal and that she should follow up as scheduled.  She appreciated the information.

## 2014-10-17 NOTE — Telephone Encounter (Signed)
Lm for pt to rtn call for lab results. 

## 2014-10-17 NOTE — Telephone Encounter (Signed)
-----   Message from Ludwig Lean, RN sent at 10/17/2014  2:08 PM EST -----   ----- Message -----    From: Ladell Pier, MD    Sent: 10/17/2014   7:09 AM      To: Ludwig Lean, RN, #  Please call patient, cea is normal, f/u as scheduled

## 2014-12-14 DIAGNOSIS — I639 Cerebral infarction, unspecified: Secondary | ICD-10-CM

## 2014-12-14 HISTORY — DX: Cerebral infarction, unspecified: I63.9

## 2015-01-11 ENCOUNTER — Encounter (HOSPITAL_COMMUNITY): Payer: Self-pay | Admitting: Emergency Medicine

## 2015-01-11 ENCOUNTER — Inpatient Hospital Stay (HOSPITAL_COMMUNITY)
Admission: EM | Admit: 2015-01-11 | Discharge: 2015-01-13 | DRG: 069 | Disposition: A | Discharge status: Home Health | Payer: Medicare Other | Attending: Family Medicine | Admitting: Family Medicine

## 2015-01-11 ENCOUNTER — Emergency Department (HOSPITAL_COMMUNITY): Payer: Medicare Other

## 2015-01-11 ENCOUNTER — Inpatient Hospital Stay (HOSPITAL_COMMUNITY): Payer: Medicare Other

## 2015-01-11 DIAGNOSIS — R200 Anesthesia of skin: Secondary | ICD-10-CM

## 2015-01-11 DIAGNOSIS — R410 Disorientation, unspecified: Secondary | ICD-10-CM | POA: Diagnosis present

## 2015-01-11 DIAGNOSIS — G459 Transient cerebral ischemic attack, unspecified: Secondary | ICD-10-CM | POA: Diagnosis present

## 2015-01-11 DIAGNOSIS — R208 Other disturbances of skin sensation: Secondary | ICD-10-CM | POA: Diagnosis not present

## 2015-01-11 DIAGNOSIS — Z8541 Personal history of malignant neoplasm of cervix uteri: Secondary | ICD-10-CM

## 2015-01-11 DIAGNOSIS — I10 Essential (primary) hypertension: Secondary | ICD-10-CM | POA: Diagnosis present

## 2015-01-11 DIAGNOSIS — G92 Toxic encephalopathy: Secondary | ICD-10-CM | POA: Diagnosis present

## 2015-01-11 DIAGNOSIS — R209 Unspecified disturbances of skin sensation: Secondary | ICD-10-CM | POA: Diagnosis present

## 2015-01-11 DIAGNOSIS — Z85038 Personal history of other malignant neoplasm of large intestine: Secondary | ICD-10-CM

## 2015-01-11 DIAGNOSIS — R202 Paresthesia of skin: Secondary | ICD-10-CM

## 2015-01-11 DIAGNOSIS — I672 Cerebral atherosclerosis: Secondary | ICD-10-CM | POA: Diagnosis present

## 2015-01-11 LAB — URINALYSIS, ROUTINE W REFLEX MICROSCOPIC
Bilirubin Urine: NEGATIVE
Glucose, UA: NEGATIVE mg/dL
Hgb urine dipstick: NEGATIVE
KETONES UR: NEGATIVE mg/dL
LEUKOCYTES UA: NEGATIVE
NITRITE: NEGATIVE
Protein, ur: NEGATIVE mg/dL
Specific Gravity, Urine: 1.008 (ref 1.005–1.030)
Urobilinogen, UA: 0.2 mg/dL (ref 0.0–1.0)
pH: 7 (ref 5.0–8.0)

## 2015-01-11 LAB — I-STAT CHEM 8, ED
BUN: 14 mg/dL (ref 6–20)
CALCIUM ION: 1.16 mmol/L (ref 1.13–1.30)
CHLORIDE: 104 mmol/L (ref 101–111)
Creatinine, Ser: 0.8 mg/dL (ref 0.44–1.00)
Glucose, Bld: 95 mg/dL (ref 65–99)
HEMATOCRIT: 43 % (ref 36.0–46.0)
Hemoglobin: 14.6 g/dL (ref 12.0–15.0)
POTASSIUM: 4 mmol/L (ref 3.5–5.1)
Sodium: 140 mmol/L (ref 135–145)
TCO2: 22 mmol/L (ref 0–100)

## 2015-01-11 LAB — PROTIME-INR
INR: 1.06 (ref 0.00–1.49)
Prothrombin Time: 14 seconds (ref 11.6–15.2)

## 2015-01-11 LAB — ETHANOL: Alcohol, Ethyl (B): <5 mg/dL (ref <5)

## 2015-01-11 LAB — CBC
HCT: 40.6 % (ref 36.0–46.0)
Hemoglobin: 13.4 g/dL (ref 12.0–15.0)
MCH: 29.1 pg (ref 26.0–34.0)
MCHC: 33 g/dL (ref 30.0–36.0)
MCV: 88.1 fL (ref 78.0–100.0)
Platelets: 206 10*3/uL (ref 150–400)
RBC: 4.61 MIL/uL (ref 3.87–5.11)
RDW: 14.6 % (ref 11.5–15.5)
WBC: 6.4 10*3/uL (ref 4.0–10.5)

## 2015-01-11 LAB — RAPID URINE DRUG SCREEN, HOSP PERFORMED
AMPHETAMINES: NOT DETECTED
Barbiturates: NOT DETECTED
Benzodiazepines: NOT DETECTED
COCAINE: NOT DETECTED
OPIATES: NOT DETECTED
Tetrahydrocannabinol: NOT DETECTED

## 2015-01-11 LAB — DIFFERENTIAL
Basophils Absolute: 0 10*3/uL (ref 0.0–0.1)
Basophils Relative: 0 % (ref 0–1)
EOS PCT: 2 % (ref 0–5)
Eosinophils Absolute: 0.1 10*3/uL (ref 0.0–0.7)
LYMPHS ABS: 1.8 10*3/uL (ref 0.7–4.0)
Lymphocytes Relative: 29 % (ref 12–46)
Monocytes Absolute: 0.4 10*3/uL (ref 0.1–1.0)
Monocytes Relative: 6 % (ref 3–12)
NEUTROS ABS: 4 10*3/uL (ref 1.7–7.7)
NEUTROS PCT: 63 % (ref 43–77)

## 2015-01-11 LAB — COMPREHENSIVE METABOLIC PANEL
ALK PHOS: 42 U/L (ref 38–126)
ALT: 13 U/L — AB (ref 14–54)
AST: 24 U/L (ref 15–41)
Albumin: 4 g/dL (ref 3.5–5.0)
Anion gap: 11 (ref 5–15)
BUN: 13 mg/dL (ref 6–20)
CO2: 25 mmol/L (ref 22–32)
CREATININE: 0.74 mg/dL (ref 0.44–1.00)
Calcium: 9.2 mg/dL (ref 8.9–10.3)
Chloride: 103 mmol/L (ref 101–111)
GFR calc Af Amer: >60 mL/min (ref >60)
GFR calc non Af Amer: >60 mL/min (ref >60)
GLUCOSE: 93 mg/dL (ref 65–99)
POTASSIUM: 4.1 mmol/L (ref 3.5–5.1)
SODIUM: 139 mmol/L (ref 135–145)
TOTAL PROTEIN: 7.4 g/dL (ref 6.5–8.1)
Total Bilirubin: 0.5 mg/dL (ref 0.3–1.2)

## 2015-01-11 LAB — I-STAT TROPONIN, ED: Troponin i, poc: 0 ng/mL (ref 0.00–0.08)

## 2015-01-11 LAB — AMMONIA: Ammonia: 18 umol/L (ref 9–35)

## 2015-01-11 LAB — APTT: aPTT: 27 seconds (ref 24–37)

## 2015-01-11 LAB — VITAMIN B12: Vitamin B-12: 168 pg/mL — ABNORMAL LOW (ref 180–914)

## 2015-01-11 LAB — PHOSPHORUS: Phosphorus: 4.2 mg/dL (ref 2.5–4.6)

## 2015-01-11 LAB — MAGNESIUM: MAGNESIUM: 1.8 mg/dL (ref 1.7–2.4)

## 2015-01-11 LAB — TSH: TSH: 1.802 u[IU]/mL (ref 0.350–4.500)

## 2015-01-11 MED ORDER — LABETALOL HCL 5 MG/ML IV SOLN: 10.0000 mg | INTRAVENOUS | Status: DC | PRN

## 2015-01-11 MED ORDER — METOPROLOL SUCCINATE ER 25 MG PO TB24
25.0000 mg | ORAL_TABLET | Freq: Every evening | ORAL | Status: DC
  Administered 2015-01-12: 25 mg via ORAL
  Filled 2015-01-11: qty 1

## 2015-01-11 MED ORDER — ASPIRIN 300 MG RE SUPP
300.0000 mg | Freq: Every day | RECTAL | Status: DC
  Administered 2015-01-12: 300 mg via RECTAL
  Filled 2015-01-11: qty 1

## 2015-01-11 MED ORDER — SENNOSIDES-DOCUSATE SODIUM 8.6-50 MG PO TABS: 1.0000 | ORAL_TABLET | Freq: Every evening | ORAL | Status: DC | PRN

## 2015-01-11 MED ORDER — ASPIRIN 325 MG PO TABS
325.0000 mg | ORAL_TABLET | Freq: Every day | ORAL | Status: DC
  Administered 2015-01-13: 325 mg via ORAL
  Filled 2015-01-11: qty 1

## 2015-01-11 NOTE — ED Notes (Signed)
Doctors at the bedside

## 2015-01-11 NOTE — Significant Event (Signed)
Rapid Response Event Note  Overview:  Called to see patient as part of Code Stroke at 1150.    Initial Focused Assessment: Arrived at ED desk alert but confused. NIHSS 3 with numbness on left side and unable to recall month or age.  No relevant history. LSN 1120 in church, with sudden onset of confusion and numbness. Arrived by EMS transport.  Interventions: Assisted with CT scan, assessment, and transfer to MRI. Patient remained confused through out tests. No stroke intervention indicated at this time per neurologist.   Event Summary:   at      at          Cynthia [name]

## 2015-01-11 NOTE — H&P (Signed)
[facility] Admission History and Physical Service Pager: [phone]  Patient name: Cynthia [name] Medical record number: [id number] Date of birth: 01-Dec-1935 Age: 79 y.o. Gender: female  Primary Care Provider: [name], MD Consultants: Neurology Code Status: Partial (DNI) Confirmed on admission.   Chief Complaint: Dizziness / Right sided numbness.  Assessment and Plan: Cynthia [name] is a 79 y.o. female presenting with Loss of short term memory and an episode of right sided numbness. PMH is significant for Cervical Cancer, Colon Cancer s/p hemicolectomy and in remission, HTN.   # Acute Encephalopathy -  Pt. With hx of right sided numbness / weakness while at [facility] this am, Now with no memory of events from last night until now. HTN to 200/120 by EMS at [facility]. She did not understand why she is in the hospital. Her children filled in the story. NIHSS - 3 on admission. Poor short term memory with remembering 1/3 words on MMSE otherwise no deficits on MMSE. Neurological evaluation without any focal deficits. MRI/ CT without evidence of stroke. Electrolytes normal, BAL < 5, EKG without acute changes. Differential includes TIA, Transient Global Amnesia, HTN Encephalopathy, PRES, Metabolic derangement, Toxic Metabolic, post-ictal. Hopeful for recovery given limited, albeit significant, scope of deficit.    - Admit to tele / neuro under Dr. Mingo Amber - Observation overnight for improvement.  - Toxic Metabolic Encephalopathy Labs - Mg, Phos, B12, Folate, TSH, ammonia - UDS pending - Holding off on infectious workup given the transient nature of her symptoms and little evidence of other abnormality on initial testing.  - Started on ASA - Holding Lovenox for 24 hours.  - Neurology on board.  - CVA/TIA work up:  - Carotid dopplers  - 2D echocardiogram  - Neuro checks per protocol - PT/OT - High intensity statin deferred to PCP, Dr. Thayer Jew - EEG  # HTN  - On Metoprolol 25mg  XL at home. Continue here.   FEN/GI: NPO until swallow screen.  Prophylaxis: SCD's and then Lovenox after 24 hours.   Disposition: Observation pending further workup and resolution of symptoms.   History of Present Illness: Cynthia [name] is a 79 y.o. female presenting with confusion and loss of short term memory after an episode of right sided numbness and weakness at 11:30am today. She was at [facility], but she cannot remember anything after yesterday evening. She has never had an episode like this. She is confused in the Ed, and her two Adult children filled in the rest of the history / report from [facility] members and EMS. They said that she was sitting in church / other members reported that she was acting somewhat strange. She would not stand up to sing in the choir like she normally does, and then when they tried to get her to stand up she couldn't. She was acting strange / couldn't remember where she was. EMS was called, and found her BP to be 200/120 when they initially evaluated her. They brought her to the ED, where she was found to be still confused. She no longer had the right sided numbness or weakness in the ED. Her NIHSS was 3.   In the ED, Vital signs were stable, and only significant for mildly elevated BP's to 185 / 92 initially, but returned to 150's/80s without intervention. .   Review Of Systems: Per HPI with the following additions: None Otherwise 12 point review of systems was performed and was unremarkable.  Patient Active Problem List   Diagnosis Date  Noted  . TIA (transient ischemic attack) 01/11/2015  . Confusion   . Left arm numbness   . DVT, lower extremity, distal 10/27/2013  . T4a, N0 09/27/2013  . Iron deficiency anemia 09/04/2013  . Symptomatic anemia 09/03/2013  . Orthostasis 09/03/2013  . Near syncope 09/03/2013   Past Medical History: Past Medical History  Diagnosis Date  . Cancer     cervical ca  . Anemia   . Blood transfusion  without reported diagnosis jan 2015  . Colon cancer   . Heart murmur   . Hypertension     no bp meds   . Liver cyst   . Complication of anesthesia     slow to awaken in past   Past Surgical History: Past Surgical History  Procedure Laterality Date  . Abdominal hysterectomy      vaginal- partial  . Back surgery  1981    lower lumbar  . Tubal ligation  1968  . Laparoscopic right colectomy Right 10/22/2013    Procedure: LAPAROSCOPIC RIGHT COLECTOMY ;  Surgeon: [name], MD;  Location: [facility] ORS;  Service: General;  Laterality: Right;  . Salpingoophorectomy  10/22/2013    Procedure: Marquette Saa;  Surgeon: [name]. [name], MD;  Location: [facility] ORS;  Service: Gynecology;;   Social History: History  Substance Use Topics  . Smoking status: Never Smoker   . Smokeless tobacco: Never Used  . Alcohol Use: No   Additional social history: None  Please also refer to relevant sections of EMR.  Family History: Family History  Problem Relation Age of Onset  . Diabetes Sister   . Cancer Sister     leukemia  . Diabetes Brother   . Heart failure Brother   . Hypertension Brother   . Breast cancer Maternal Aunt    Allergies and Medications: No Known Allergies No current facility-administered medications on file prior to encounter.   Current Outpatient Prescriptions on File Prior to Encounter  Medication Sig Dispense Refill  . loperamide (IMODIUM) 2 MG capsule Take 2-4 mg by mouth every 4 (four) hours as needed for diarrhea or loose stools.    . ondansetron (ZOFRAN ODT) 8 MG disintegrating tablet Take 1 tablet (8 mg total) by mouth every 8 (eight) hours as needed for nausea or vomiting. (Patient not taking: Reported on 07/18/2014) 10 tablet 0    Objective: BP 164/74 mmHg  Pulse 66  Temp(Src) 98.1 F (36.7 C) (Oral)  Resp 18  SpO2 96% Exam: General: NAD, conversant, oriented only to [place], Laying flat in bed.  Eyes: EOMI, PERRLA, No injection.  ENTM: Nares  patent, O/P clear, MMM Neck: FROM, Supple, no LAD Cardiovascular: RRR, 2/6 systolic ejection murmur, no gallops or rubs, no heaves, or thrills.  Respiratory: CTA Bilaterally, appropriate rate, unlabored.  Abdomen: S, NT, ND, +BS MSK: MAEW, No TTP, no Edema, 2+ distal pulses.  Skin: No rashes, no lesions Neuro: AAOx3 (name, "hospital in [place]," and "June" but didn't know year or day of week/month) not oriented to situation at all, CN II-XII tested and in tact, 5/5 motor in all extremities and equal, No sensory deficits, Ambulation without difficulty, No pronator drift, Cerebellar tested and in tact. MMSE without any deficits other than missed "O" in "WORLD backwards", and missed 2/3 short term work recall. Instant recall is 3/3 intact. Psych: Appropriate mood / affect.   Labs and Imaging: CBC BMET   Recent Labs Lab 01/11/15 1200 01/11/15 1207  WBC 6.4  --   HGB 13.4  14.6  HCT 40.6 43.0  PLT 206  --     Recent Labs Lab 01/11/15 1200 01/11/15 1207  NA 139 140  K 4.1 4.0  CL 103 104  CO2 25  --   BUN 13 14  CREATININE 0.74 0.80  GLUCOSE 93 95  CALCIUM 9.2  --      Troponin - 0.0  EKG - No acute changes   MRI Brain 5/29:  FINDINGS: No evidence for acute infarction, hemorrhage, mass lesion, hydrocephalus, or extra-axial fluid. Moderate cerebral and cerebellar atrophy. Moderate subcortical and periventricular T2 and FLAIR hyperintensities, likely chronic microvascular ischemic change. Chronic lacunar infarct RIGHT basal ganglia. Prominent perivascular spaces. Flow voids are maintained throughout the carotid, basilar, and vertebral arteries. There are no areas of chronic hemorrhage. Partial empty sella. No tonsillar herniation. No upper cervical lesions. Extracranial soft tissues unremarkable.  IMPRESSION: Chronic changes as described. No acute intracranial findings.  Specifically no evidence for acute stroke.   Electronically Signed  By: Rolla Flatten  M.D.  On: 01/11/2015 13:31    Aquilla Hacker, MD 01/11/2015, 4:48 PM PGY-1, [facility] pager: [phone], text pages welcome   I have seen and evaluated the patient with Dr. Minda Ditto. I am in agreement with the note above in its revised form. My additions are in red.  Heiley Shaikh B. Bonner Puna, MD, PGY-2 01/11/2015 5:49 PM

## 2015-01-11 NOTE — ED Notes (Signed)
To ED via [facility] from church with episode of "confusion" while at church. Pt having repetitive questioning, stating " I thought it was somebody else that was sick, I did not know it was me. I thought I was taking care of someone else." continues to ask "what happened?" "was I at church?" Daughter at bedside.

## 2015-01-11 NOTE — Progress Notes (Signed)
Patient arrived to room from ED. Daughter at bedside. Safety precaution and orders reviewed with patient/daughter. TELE applied and confirmed. Report to oncoming RN.   Ave Filter, RN

## 2015-01-11 NOTE — Consult Note (Signed)
Stroke Consult    Chief Complaint: left sided numbness and confusion HPI: Cynthia [name] is an 79 y.o. female hx of HTN, colon cancer presenting with acute onset of left sided numbness and confusion. Per EMS LSW 1120. She was at church when she acutely developed numbness on her left side. Per EMS she was alert and oriented upon their arrival, CBG of 93 and BP 200/120. They report on the ride over she appeared to get more confused. Her daughter notes that her blood pressure has been elevated for the past few days which is not typical for her. No prior TIA or CVA history. She is not on an antiplatelet at home. CT head imaging reviewed and no acute process.   Initial NIHSS of 3. Discussed tPA with patients daughter. Discussed the benefits and risks of treating. Based on mildness of symptoms her daughter wished to hold off at this point pending MRI results. MRI imaging shows no acute infarct.     Past Medical History  Diagnosis Date  . Cancer     cervical ca  . Anemia   . Blood transfusion without reported diagnosis jan 2015  . Colon cancer   . Heart murmur   . Hypertension     no bp meds   . Liver cyst   . Complication of anesthesia     slow to awaken in past    Past Surgical History  Procedure Laterality Date  . Abdominal hysterectomy      vaginal- partial  . Back surgery  1981    lower lumbar  . Tubal ligation  1968  . Laparoscopic right colectomy Right 10/22/2013    Procedure: LAPAROSCOPIC RIGHT COLECTOMY ;  Surgeon: [name], MD;  Location: [facility];  Service: General;  Laterality: Right;  . Salpingoophorectomy  10/22/2013    Procedure: Marquette Saa;  Surgeon: [name]. [name], MD;  Location: [facility];  Service: Gynecology;;    Family History  Problem Relation Age of Onset  . Diabetes Sister   . Cancer Sister     leukemia  . Diabetes Brother   . Heart failure Brother   . Hypertension Brother   . Breast cancer Maternal Aunt    Social History:   reports that she has never smoked. She has never used smokeless tobacco. She reports that she does not drink alcohol or use illicit drugs.  Allergies: No Known Allergies   (Not in a hospital admission)  ROS: Out of a complete 14 system review, the patient complains of only the following symptoms, and all other reviewed systems are negative. +confusion   Physical Examination: Filed Vitals:   01/11/15 1223  BP: 185/92  Pulse: 78  Temp: 98.1 F (36.7 C)  Resp: 16   Physical Exam  Constitutional: He appears well-developed and well-nourished.  Psych: Affect appropriate to situation Eyes: No scleral injection HENT: No OP obstrucion Head: Normocephalic.  Cardiovascular: Normal rate and regular rhythm.  Respiratory: Effort normal and breath sounds normal.  GI: Soft. Bowel sounds are normal. No distension. There is no tenderness.  Skin: WDI   Neurologic Examination: Mental Status: Alert, oriented to name, hospital, unsure on date and age. Mild confusion, repeatedly asks "i thought it was somebody else that was sick, I did not know it was me".   Speech fluent without evidence of aphasia. No dysarthria. Able to follow 3 step commands without difficulty. Cranial Nerves: II: funduscopic exam wnl bilaterally, visual fields grossly normal, pupils equal, round, reactive to light and  accommodation III,IV, VI: ptosis not present, extra-ocular motions intact bilaterally V,VII: smile symmetric, facial light touch sensation normal bilaterally VIII: hearing normal bilaterally IX,X: gag reflex present XI: trapezius strength/neck flexion strength normal bilaterally XII: tongue strength normal  Motor: Right : Upper extremity    Left:     Upper extremity 5/5 deltoid       5/5 deltoid 5/5 biceps      5/5 biceps  5/5 triceps      5/5 triceps 5/5 hand grip      5/5 hand grip  Lower extremity     Lower extremity 5/5 hip flexor      5/5 hip flexor 5/5 quadricep      5/5 quadriceps  5/5  hamstrings     5/5 hamstrings 5/5 plantar flexion       5/5 plantar flexion 5/5 plantar extension     5/5 plantar extension Tone and bulk:normal tone throughout; no atrophy noted Sensory: decreased LT on the LUE > LLE Deep Tendon Reflexes: 2+ and symmetric throughout Plantars: Right: downgoing   Left: downgoing Cerebellar: normal finger-to-nose, and normal heel-to-shin test Gait: deferred  Laboratory Studies:   Basic Metabolic Panel:  Recent Labs Lab 01/11/15 1207  NA 140  K 4.0  CL 104  GLUCOSE 95  BUN 14  CREATININE 0.80    Liver Function Tests: No results for input(s): AST, ALT, ALKPHOS, BILITOT, PROT, ALBUMIN in the last 168 hours. No results for input(s): LIPASE, AMYLASE in the last 168 hours. No results for input(s): AMMONIA in the last 168 hours.  CBC:  Recent Labs Lab 01/11/15 1200 01/11/15 1207  WBC 6.4  --   NEUTROABS 4.0  --   HGB 13.4 14.6  HCT 40.6 43.0  MCV 88.1  --   PLT 206  --     Cardiac Enzymes: No results for input(s): CKTOTAL, CKMB, CKMBINDEX, TROPONINI in the last 168 hours.  BNP: Invalid input(s): POCBNP  CBG: No results for input(s): GLUCAP in the last 168 hours.  Microbiology: Results for orders placed or performed in visit on 03/26/14  Clostridium Difficile by PCR     Status: Abnormal   Collection Time: 03/26/14  2:26 PM  Result Value Ref Range Status   C difficile by pcr Positive (AA) Negative Final    Comment:  This assay detects the presence of Clostridium difficile DNA codingfor toxin B (tcdB) by real-time polymerase chain reaction (PCR)amplification.This test was developed and its performance characteristics have beendetermined by [employer].  Performance characteristics referto the analytical performance of the test. This test has not beencleared or approved by the [place] Food and Drug Administration. The FDAhas determined that such clearance or approval is not necessary. Thislaboratory is  certified under the  [facility] as qualified to perform high complexity clinicallaboratory testing.CALLED B. MISENHEIMER MT 16:45 03/26/14 ([email or web address])    Coagulation Studies: No results for input(s): LABPROT, INR in the last 72 hours.  Urinalysis: No results for input(s): COLORURINE, LABSPEC, PHURINE, GLUCOSEU, HGBUR, BILIRUBINUR, KETONESUR, PROTEINUR, UROBILINOGEN, NITRITE, LEUKOCYTESUR in the last 168 hours.  Invalid input(s): APPERANCEUR  Lipid Panel:  No results found for: CHOL, TRIG, HDL, CHOLHDL, VLDL, LDLCALC  HgbA1C: No results found for: HGBA1C  Urine Drug Screen:  No results found for: LABOPIA, COCAINSCRNUR, LABBENZ, AMPHETMU, THCU, LABBARB  Alcohol Level: No results for input(s): ETH in the last 168 hours.  Other results:  Imaging: Ct Head Wo Contrast  01/11/2015   CLINICAL DATA:  Code stroke, right  facial numbness, confusion, left-sided weakness  EXAM: CT HEAD WITHOUT CONTRAST  TECHNIQUE: Contiguous axial images were obtained from the base of the skull through the vertex without intravenous contrast.  COMPARISON:  None.  FINDINGS: No evidence of parenchymal hemorrhage or extra-axial fluid collection. No mass lesion, mass effect, or midline shift.  No CT evidence of acute infarction.  Subcortical white matter and periventricular small vessel ischemic changes. Mild intracranial atherosclerosis.  Global cortical atrophy.  No ventriculomegaly.  The visualized paranasal sinuses are essentially clear. The mastoid air cells are unopacified.  No evidence of calvarial fracture.  IMPRESSION: No evidence of acute intracranial abnormality.  Atrophy with small vessel ischemic changes.  These results were called by telephone at the time of interpretation on 01/11/2015 at 12:10 pm to Dr. Jim Like, who verbally acknowledged these results.   Electronically Signed   By: Julian Hy M.D.   On: 01/11/2015 12:11    Assessment: 79 y.o. female with history of HTN, colon cancer  presenting with acute onset of left sided sensory loss and confusion. BP noted to be 200/120 upon EMS arrival. MRI brain shows no acute infarct. Will admit for further workup. Differential includes hypertensive encephalopathy/PRES vs TIA vs seizure/post-ictal. Patient afebrile with no leukocytosis.   Plan: 1. BP control per primary team 2. EEG 3. Echocardiogram 4. Carotid dopplers 5. Check B12, TSH, ammonia, UDS 6. ASA 81mg  daily 7. Will continue to follow   Jim Like, DO [facility] [phone]  If 7pm- 7am, please page neurology on call as listed in [place]. 01/11/2015, 12:36 PM

## 2015-01-11 NOTE — ED Notes (Signed)
Remains in MRI 

## 2015-01-11 NOTE — ED Notes (Signed)
Pt undressed, in gown, on monitor, continuous pulse oximetry and blood pressure cuff; visitor at bedside at this time

## 2015-01-11 NOTE — ED Notes (Signed)
Pt is in MRI at this time.

## 2015-01-11 NOTE — ED Provider Notes (Signed)
CSN: [id number]     Arrival date & time 01/11/15  1156 History   First MD Initiated Contact with Patient 01/11/15 1205     Chief Complaint  Patient presents with  . Code Stroke    @EDPCLEARED @ (Consider location/radiation/quality/duration/timing/severity/associated sxs/prior Treatment) HPI The patient acutely developed confusion and left-sided numbness while at church at 11:30. There was not apparent motor defect. She had confusion about location and repetitive questioning. Upon EMS arrival the patient was hypertensive. Initially they found her to be fairly well oriented but then on route seemed more confused. She has had recent blood pressure elevations over the past 3 days per her daughter's report. No other apparent recent illness. Past Medical History  Diagnosis Date  . Cancer     cervical ca  . Anemia   . Blood transfusion without reported diagnosis jan 2015  . Colon cancer   . Heart murmur   . Hypertension     no bp meds   . Liver cyst   . Complication of anesthesia     slow to awaken in past   Past Surgical History  Procedure Laterality Date  . Abdominal hysterectomy      vaginal- partial  . Back surgery  1981    lower lumbar  . Tubal ligation  1968  . Laparoscopic right colectomy Right 10/22/2013    Procedure: LAPAROSCOPIC RIGHT COLECTOMY ;  Surgeon: [name], MD;  Location: [facility];  Service: General;  Laterality: Right;  . Salpingoophorectomy  10/22/2013    Procedure: Marquette Saa;  Surgeon: [name]. [name], MD;  Location: [facility];  Service: Gynecology;;   Family History  Problem Relation Age of Onset  . Diabetes Sister   . Cancer Sister     leukemia  . Diabetes Brother   . Heart failure Brother   . Hypertension Brother   . Breast cancer Maternal Aunt    History  Substance Use Topics  . Smoking status: Never Smoker   . Smokeless tobacco: Never Used  . Alcohol Use: No   OB History    No data available     Review of Systems 10  Systems reviewed and are negative for acute change except as noted in the HPI.    Allergies  Review of patient's allergies indicates no known allergies.  Home Medications   Prior to Admission medications   Medication Sig Start Date End Date Taking? Authorizing Provider  loperamide (IMODIUM) 2 MG capsule Take 2-4 mg by mouth every 4 (four) hours as needed for diarrhea or loose stools.   Yes Historical Provider, MD  metoprolol succinate (TOPROL-XL) 25 MG 24 hr tablet Take 25 mg by mouth every evening. 12/20/14  Yes Historical Provider, MD  ondansetron (ZOFRAN ODT) 8 MG disintegrating tablet Take 1 tablet (8 mg total) by mouth every 8 (eight) hours as needed for nausea or vomiting. Patient not taking: Reported on 07/18/2014 02/22/14   Lajean Saver, MD   BP 164/74 mmHg  Pulse 66  Temp(Src) 98.1 F (36.7 C) (Oral)  Resp 18  SpO2 96% Physical Exam  Constitutional: She appears well-developed and well-nourished.  Mildly confused. No respiratory distress.  HENT:  Head: Normocephalic and atraumatic.  Mouth/Throat: Oropharynx is clear and moist.  Eyes: EOM are normal. Pupils are equal, round, and reactive to light.  Neck: Neck supple.  Cardiovascular: Normal rate, regular rhythm, normal heart sounds and intact distal pulses.   Pulmonary/Chest: Effort normal and breath sounds normal.  Abdominal: Soft. Bowel sounds are normal.  She exhibits no distension. There is no tenderness.  Musculoskeletal: Normal range of motion. She exhibits no edema.  Neurological: She is alert. She has normal strength. No cranial nerve deficit. She exhibits normal muscle tone. Coordination normal. GCS eye subscore is 4. GCS verbal subscore is 5. GCS motor subscore is 6.  Patient does report some difficulty using the left hand to remove rings on her right due to numbness.  Skin: Skin is warm, dry and intact.    ED Course  Procedures (including critical care time) Labs Review Labs Reviewed  COMPREHENSIVE METABOLIC  PANEL - Abnormal; Notable for the following:    ALT 13 (*)    All other components within normal limits  ETHANOL  PROTIME-INR  APTT  CBC  DIFFERENTIAL  URINE RAPID DRUG SCREEN (HOSP PERFORMED) NOT AT [facility]  URINALYSIS, ROUTINE W REFLEX MICROSCOPIC (NOT AT [facility])  I-STAT CHEM 8, ED  I-STAT TROPOININ, ED    Imaging Review Ct Head Wo Contrast  01/11/2015   CLINICAL DATA:  Code stroke, right facial numbness, confusion, left-sided weakness  EXAM: CT HEAD WITHOUT CONTRAST  TECHNIQUE: Contiguous axial images were obtained from the base of the skull through the vertex without intravenous contrast.  COMPARISON:  None.  FINDINGS: No evidence of parenchymal hemorrhage or extra-axial fluid collection. No mass lesion, mass effect, or midline shift.  No CT evidence of acute infarction.  Subcortical white matter and periventricular small vessel ischemic changes. Mild intracranial atherosclerosis.  Global cortical atrophy.  No ventriculomegaly.  The visualized paranasal sinuses are essentially clear. The mastoid air cells are unopacified.  No evidence of calvarial fracture.  IMPRESSION: No evidence of acute intracranial abnormality.  Atrophy with small vessel ischemic changes.  These results were called by telephone at the time of interpretation on 01/11/2015 at 12:10 pm to Dr. Jim Like, who verbally acknowledged these results.   Electronically Signed   By: Julian Hy M.D.   On: 01/11/2015 12:11   Mr Brain Wo Contrast  01/11/2015   CLINICAL DATA:  Acute onset of confusion while at church earlier today. LEFT arm numbness.  EXAM: MRI HEAD WITHOUT CONTRAST  TECHNIQUE: Multiplanar, multiecho pulse sequences of the brain and surrounding structures were obtained without intravenous contrast.  COMPARISON:  CT head 01/11/2015.  FINDINGS: No evidence for acute infarction, hemorrhage, mass lesion, hydrocephalus, or extra-axial fluid. Moderate cerebral and cerebellar atrophy. Moderate subcortical and periventricular  T2 and FLAIR hyperintensities, likely chronic microvascular ischemic change. Chronic lacunar infarct RIGHT basal ganglia. Prominent perivascular spaces. Flow voids are maintained throughout the carotid, basilar, and vertebral arteries. There are no areas of chronic hemorrhage. Partial empty sella. No tonsillar herniation. No upper cervical lesions. Extracranial soft tissues unremarkable.  IMPRESSION: Chronic changes as described.  No acute intracranial findings.  Specifically no evidence for acute stroke.   Electronically Signed   By: Rolla Flatten M.D.   On: 01/11/2015 13:31     EKG Interpretation   Date/Time:  Sunday Jan 11 2015 12:20:45 EDT Ventricular Rate:  75 PR Interval:  180 QRS Duration: 88 QT Interval:  398 QTC Calculation: 444 R Axis:   65 Text Interpretation:  Sinus rhythm Biatrial enlargement Minimal ST  depression, lateral leads normal. no chabge c/w old Confirmed by Johnney Killian,  MD, Jeannie Done [phone]) on 01/11/2015 3:30:35 PM      MDM   Final diagnoses:  Disorientation  Arm numbness left   Patient initially evaluated as code stroke. NIH scale of 3. Dr. Janann Colonel evaluated the patient and had  MRI performed. At this point in time acute CVA is not identified. Of note hypertension blood pressures were reported from the field. A bladder Sumner's first thought was for possible hypertensive encephalopathy. However in the emergency department the pressures have been systolics of 818M to 037V. The patient's mental status rains unchanged. At this point the patient will be admitted for further evaluation and diagnostic workup as needed.    Charlesetta Shanks, MD 01/11/15 [phone]

## 2015-01-11 NOTE — ED Notes (Signed)
Pt in MRI-- continues to ask repetitive questions -- this nurse in MRI with pt, Dr. In MRI also.

## 2015-01-11 NOTE — ED Notes (Signed)
Returned from MRI-- monitor on, daughter and friend at bedside.

## 2015-01-11 NOTE — ED Notes (Signed)
Pt passed the swallow screenb

## 2015-01-11 NOTE — ED Notes (Signed)
The pt had difficulty gettiing rid of the cracker in the back of her throat after she had chewed and swallowed the cracker,.  She was failed t as swallow screen

## 2015-01-12 ENCOUNTER — Inpatient Hospital Stay (HOSPITAL_COMMUNITY): Payer: Medicare Other

## 2015-01-12 ENCOUNTER — Encounter (HOSPITAL_COMMUNITY): Payer: Self-pay

## 2015-01-12 DIAGNOSIS — R410 Disorientation, unspecified: Secondary | ICD-10-CM

## 2015-01-12 DIAGNOSIS — R200 Anesthesia of skin: Secondary | ICD-10-CM

## 2015-01-12 DIAGNOSIS — G459 Transient cerebral ischemic attack, unspecified: Secondary | ICD-10-CM

## 2015-01-12 DIAGNOSIS — R202 Paresthesia of skin: Secondary | ICD-10-CM

## 2015-01-12 DIAGNOSIS — R208 Other disturbances of skin sensation: Secondary | ICD-10-CM

## 2015-01-12 LAB — LIPID PANEL
CHOL/HDL RATIO: 3.6 ratio
CHOLESTEROL: 245 mg/dL — AB (ref 0–200)
HDL: 69 mg/dL (ref >40)
LDL Cholesterol: 164 mg/dL — ABNORMAL HIGH (ref 0–99)
TRIGLYCERIDES: 60 mg/dL (ref <150)
VLDL: 12 mg/dL (ref 0–40)

## 2015-01-12 MED ORDER — CYANOCOBALAMIN 1000 MCG/ML IJ SOLN
1000.0000 ug | Freq: Once | INTRAMUSCULAR | Status: AC
  Administered 2015-01-12: 1000 ug via INTRAMUSCULAR
  Filled 2015-01-12: qty 1

## 2015-01-12 MED ORDER — HEPARIN SODIUM (PORCINE) 5000 UNIT/ML IJ SOLN
5000.0000 [IU] | Freq: Three times a day (TID) | INTRAMUSCULAR | Status: DC
  Administered 2015-01-12 – 2015-01-13 (×2): 5000 [IU] via SUBCUTANEOUS
  Filled 2015-01-12 (×2): qty 1

## 2015-01-12 NOTE — Progress Notes (Signed)
Subjective: Patient still feels her fingers on her left hand have paresthesia.  No paresthesia of her palm or dorsum of her hand.  She also still has decreased sensation on the left cheek that "comes and goes".   [id number] TSH-1.8 Mag- 1.8 Ammonia- 18   Objective: Current vital signs: BP 162/86 mmHg  Pulse 77  Temp(Src) 98.6 F (37 C) (Oral)  Resp 18  Ht 5\' 2"  (1.575 m)  Wt 61.916 kg (136 lb 8 oz)  BMI 24.96 kg/m2  SpO2 95% Vital signs in last 24 hours: Temp:  [97.7 F (36.5 C)-99.3 F (37.4 C)] 98.6 F (37 C) (05/30 0630) Pulse Rate:  [64-84] 77 (05/30 0800) Resp:  [14-18] 18 (05/30 0800) BP: (123-185)/(56-92) 162/86 mmHg (05/30 0800) SpO2:  [94 %-100 %] 95 % (05/30 0800) Weight:  [61.916 kg (136 lb 8 oz)] 61.916 kg (136 lb 8 oz) (05/29 2050)  Intake/Output from previous day:   Intake/Output this shift:   Nutritional status: Diet NPO time specified  Neurologic Exam:  Mental Status: Alert, oriented, thought content appropriate.  Speech fluent without evidence of aphasia.  Able to follow 3 step commands without difficulty. Cranial Nerves: II: Visual fields grossly normal, pupils equal, round, reactive to light and accommodation III,IV, VI: ptosis not present, extra-ocular motions intact bilaterally V,VII: smile symmetric, facial light touch sensation decreased on the left cheek VIII: hearing normal bilaterally IX,X: uvula rises symmetrically XI: bilateral shoulder shrug XII: midline tongue extension without atrophy or fasciculations  Motor: Right : Upper extremity   5/5    Left:     Upper extremity   5/5  Lower extremity   5/5     Lower extremity   5/5 Tone and bulk:normal tone throughout; no atrophy noted Sensory: Pinprick and light touch decreased in left fingers on left hand, otherwise intact.  Deep Tendon Reflexes:  Right: Upper Extremity   Left: Upper extremity   biceps (C-5 to C-6) 2/4   biceps (C-5 to C-6) 2/4 tricep (C7) 2/4    triceps (C7)  2/4 Brachioradialis (C6) 2/4  Brachioradialis (C6) 2/4  Lower Extremity Lower Extremity  quadriceps (L-2 to L-4) 2/4   quadriceps (L-2 to L-4) 2/4 Achilles (S1) 2/4   Achilles (S1) 2/4  Plantars: Right: downgoing   Left: downgoing Cerebellar: normal finger-to-nose,  normal heel-to-shin test    Lab Results: Basic Metabolic Panel:  Recent Labs Lab 01/11/15 1200 01/11/15 1207 01/11/15 1920  NA 139 140  --   K 4.1 4.0  --   CL 103 104  --   CO2 25  --   --   GLUCOSE 93 95  --   BUN 13 14  --   CREATININE 0.74 0.80  --   CALCIUM 9.2  --   --   MG  --   --  1.8  PHOS  --   --  4.2    Liver Function Tests:  Recent Labs Lab 01/11/15 1200  AST 24  ALT 13*  ALKPHOS 42  BILITOT 0.5  PROT 7.4  ALBUMIN 4.0   No results for input(s): LIPASE, AMYLASE in the last 168 hours.  Recent Labs Lab 01/11/15 1920  AMMONIA 18    CBC:  Recent Labs Lab 01/11/15 1200 01/11/15 1207  WBC 6.4  --   NEUTROABS 4.0  --   HGB 13.4 14.6  HCT 40.6 43.0  MCV 88.1  --   PLT 206  --     Cardiac Enzymes: No results for input(s):  CKTOTAL, CKMB, CKMBINDEX, TROPONINI in the last 168 hours.  Lipid Panel: No results for input(s): CHOL, TRIG, HDL, CHOLHDL, VLDL, LDLCALC in the last 168 hours.  CBG: No results for input(s): GLUCAP in the last 168 hours.  Microbiology: Results for orders placed or performed in visit on 03/26/14  Clostridium Difficile by PCR     Status: Abnormal   Collection Time: 03/26/14  2:26 PM  Result Value Ref Range Status   C difficile by pcr Positive (AA) Negative Final    Comment:  This assay detects the presence of Clostridium difficile DNA codingfor toxin B (tcdB) by real-time polymerase chain reaction (PCR)amplification.This test was developed and its performance characteristics have beendetermined by [employer].  Performance characteristics referto the analytical performance of the test. This test has not beencleared or approved by the [place] Food  and Drug Administration. The FDAhas determined that such clearance or approval is not necessary. Thislaboratory is  certified under the [facility] [place] as qualified to perform high complexity clinicallaboratory testing.CALLED B. MISENHEIMER MT 16:45 03/26/14 ([email or web address])    Coagulation Studies:  Recent Labs  01/11/15 1200  LABPROT 14.0  INR 1.06    Imaging: Ct Head Wo Contrast  01/12/2015   CLINICAL DATA:  Dizziness with numbness and weakness, beginning 01/11/2015. Recurrence of symptoms today with numbness in LEFT face and tingling in LEFT fingers. Patient complains of seeing black spot in RIGHT eye.  EXAM: CT HEAD WITHOUT CONTRAST  TECHNIQUE: Contiguous axial images were obtained from the base of the skull through the vertex without intravenous contrast.  COMPARISON:  MR brain 01/11/2015.  CT head 01/11/2015.  FINDINGS: No evidence for acute infarction, hemorrhage, mass lesion, hydrocephalus, or extra-axial fluid. Age-related atrophic changes are stable.  Hypoattenuation of white matter representing small vessel disease without features to suggest PRES or acute subcortical infarction. No subarachnoid blood.  Calcific atherosclerosis of the proximal intracranial vessels. Unremarkable orbits, sinuses, and mastoids.  IMPRESSION: Stable exam. No acute intracranial findings. No features to suggest proximal large vessel occlusion.   Electronically Signed   By: Rolla Flatten M.D.   On: 01/12/2015 08:39   Ct Head Wo Contrast  01/11/2015   CLINICAL DATA:  Code stroke, right facial numbness, confusion, left-sided weakness  EXAM: CT HEAD WITHOUT CONTRAST  TECHNIQUE: Contiguous axial images were obtained from the base of the skull through the vertex without intravenous contrast.  COMPARISON:  None.  FINDINGS: No evidence of parenchymal hemorrhage or extra-axial fluid collection. No mass lesion, mass effect, or midline shift.  No CT evidence of acute infarction.  Subcortical white  matter and periventricular small vessel ischemic changes. Mild intracranial atherosclerosis.  Global cortical atrophy.  No ventriculomegaly.  The visualized paranasal sinuses are essentially clear. The mastoid air cells are unopacified.  No evidence of calvarial fracture.  IMPRESSION: No evidence of acute intracranial abnormality.  Atrophy with small vessel ischemic changes.  These results were called by telephone at the time of interpretation on 01/11/2015 at 12:10 pm to Dr. Jim Like, who verbally acknowledged these results.   Electronically Signed   By: Julian Hy M.D.   On: 01/11/2015 12:11   Mr Jodene Nam Head Wo Contrast  01/11/2015   CLINICAL DATA:  Follow-up.  History of colon cancer, hypertension.  EXAM: MRA HEAD WITHOUT CONTRAST  TECHNIQUE: Angiographic images of the Circle of Willis were obtained using MRA technique without intravenous contrast.  COMPARISON:  MRI brain Jan 11, 2015 at 1241 hours of  FINDINGS: Anterior circulation:  Normal flow related enhancement of the included cervical, petrous, cavernous and supra clinoid internal carotid arteries. Patent anterior communicating artery. Normal flow related enhancement of the anterior and middle cerebral arteries, including more distal segments.  No large vessel occlusion, high-grade stenosis, aneurysm. Moderate luminal irregularity of the anterior and middle cerebral arteries  Posterior circulation: RIGHT vertebral artery is dominant. Basilar artery is patent, with normal flow related enhancement of the main branch vessels. Normal flow related enhancement of the posterior cerebral arteries.  No high-grade stenosis, abnormal luminal irregularity, aneurysm. Mild to moderate stenosis RIGHT P1 segment, with thready flow related enhancement of P3 segment, poor visualization RIGHT P4 segment, thready LEFT P3 and P4 segments.  IMPRESSION: Moderate luminal irregularity of the anterior circulation most consistent with atherosclerosis.  Poor visualization of  RIGHT greater than LEFT distal posterior cerebral arteries favoring slow flow, less likely occlusion. Mild to moderate RIGHT  P1 stenosis. Mild luminal irregularity of the posterior cerebral arteries consistent with atherosclerosis.   Electronically Signed   By: Elon Alas M.D.   On: 01/11/2015 22:21   Mr Brain Wo Contrast  01/11/2015   CLINICAL DATA:  Acute onset of confusion while at church earlier today. LEFT arm numbness.  EXAM: MRI HEAD WITHOUT CONTRAST  TECHNIQUE: Multiplanar, multiecho pulse sequences of the brain and surrounding structures were obtained without intravenous contrast.  COMPARISON:  CT head 01/11/2015.  FINDINGS: No evidence for acute infarction, hemorrhage, mass lesion, hydrocephalus, or extra-axial fluid. Moderate cerebral and cerebellar atrophy. Moderate subcortical and periventricular T2 and FLAIR hyperintensities, likely chronic microvascular ischemic change. Chronic lacunar infarct RIGHT basal ganglia. Prominent perivascular spaces. Flow voids are maintained throughout the carotid, basilar, and vertebral arteries. There are no areas of chronic hemorrhage. Partial empty sella. No tonsillar herniation. No upper cervical lesions. Extracranial soft tissues unremarkable.  IMPRESSION: Chronic changes as described.  No acute intracranial findings.  Specifically no evidence for acute stroke.   Electronically Signed   By: Rolla Flatten M.D.   On: 01/11/2015 13:31    Medications:  Scheduled: . aspirin  300 mg Rectal Daily   Or  . aspirin  325 mg Oral Daily  . metoprolol succinate  25 mg Oral QPM    Assessment/Plan:  79 y.o. female with history of HTN, colon cancer presenting with acute onset of left sided sensory loss and confusion. BP noted to be 200/120 upon EMS arrival. MRI brain shows no acute infarct. Currently confusion has resolved and left sided numbness improving. Continues to have decreased sensation in fingers of left hand and left cheek.  EEG, Carotids, echo all  pending. Symptoms likely secondary to elevated BP but will continue to rule out seizure versus TIA.   Will continue to follow.    Etta Quill PA-C [facility] [phone]  01/12/2015, 9:39 AM  I personally participated in this patient's evaluation and management, including formulating above clinical impression and management recommendations.  Rush Farmer M.D. [facility] [phone]

## 2015-01-12 NOTE — Progress Notes (Signed)
*  PRELIMINARY RESULTS* Vascular Ultrasound Carotid Duplex (Doppler) has been completed.  Preliminary findings: Bilateral:  1-39% ICA stenosis.  Vertebral artery flow is antegrade.      Landry Mellow, RDMS, RVT  01/12/2015, 1:37 PM

## 2015-01-12 NOTE — Evaluation (Signed)
Physical Therapy Evaluation Patient Details Name: Cynthia [name] MRN: [id number] DOB: 08-Nov-1935 Today's Date: 01/12/2015   History of Present Illness  79 y.o. female presenting with loss of short term memory and left sided numbness. PMH: Cervical Cancer, Colon Cancer s/p hemicolectomy and in remission, HTN  Clinical Impression  Patient demonstrates deficits in functional mobility as indicated below. Will need continued skilled PT to address deficits and maximize function. Will see as indicated and progress as tolerated.  OF NOTE: Patient with very apparent difficulties performing cognitive tasks and ambulation resulting in several LOB, patient also limited by left visual field deficits causing patient to not recognize hazards in this region. Spoke with patient and family at length regarding mobility concerns and strategies for management. Patient will need HHPT evaluation upon acute discharge and recommend supervision during all mobility.    Follow Up Recommendations Home health PT;Supervision for mobility/OOB    Equipment Recommendations  None recommended by PT    Recommendations for Other Services       Precautions / Restrictions Precautions Precautions: Fall Restrictions Weight Bearing Restrictions: No      Mobility  Bed Mobility Overal bed mobility: Independent                Transfers Overall transfer level: Needs assistance Equipment used: None Transfers: Sit to/from Stand Sit to Stand: Min guard            Ambulation/Gait Ambulation/Gait assistance: Min guard;Min assist Ambulation Distance (Feet): 280 Feet Assistive device: None Gait Pattern/deviations: Step-through pattern;Decreased stride length;Narrow base of support Gait velocity: decreased Gait velocity interpretation: Below normal speed for age/gender General Gait Details: patient with difficulty performing cognitive tasks during mobility. patient also continues to demonstrate deficits in  left visual field impacting ability to mobilize safely. Patient required assist for LOB x3 and almost walked into door x2 and other objects.  Stairs Stairs: Yes Stairs assistance: Min guard Stair Management: One rail Right;Step to pattern Number of Stairs: 3 General stair comments: min guard for assist  Wheelchair Mobility    Modified Rankin (Stroke Patients Only)       Balance Overall balance assessment: Needs assistance Sitting-balance support: Single extremity supported;Feet supported Sitting balance-Leahy Scale: Good     Standing balance support: No upper extremity supported Standing balance-Leahy Scale: Fair                               Pertinent Vitals/Pain Pain Assessment: No/denies pain    Home Living Family/patient expects to be discharged to:: Private residence Living Arrangements: Children (Lives with Daughter) Available Help at Discharge: Family;Available PRN/intermittently (Daughter can adjust schedule to meet Pt needs) Type of Home: House Home Access: Stairs to enter Entrance Stairs-Rails: None Entrance Stairs-Number of Steps: 3 Home Layout: One level Home Equipment: Walker - 2 wheels;Cane - single point      Prior Function Level of Independence: Independent               Hand Dominance   Dominant Hand: Right    Extremity/Trunk Assessment   Upper Extremity Assessment: LUE deficits/detail       LUE Deficits / Details: Pt reports numbness and tingling in L hand and digits3-5   Lower Extremity Assessment: Defer to PT evaluation         Communication   Communication: No difficulties  Cognition Arousal/Alertness: Awake/alert Behavior During Therapy: WFL for tasks assessed/performed Overall Cognitive Status: Within Functional Limits for tasks assessed  General Comments General comments (skin integrity, edema, etc.): spoke with patient and family at length regarding concerns for mobility and  visual deficits. Discussed daughters concerns re: home environment and safety with mobility. Patient and daughter receptive to HHPT evaluation for safety and mobility.     Exercises        Assessment/Plan    PT Assessment Patient needs continued PT services  PT Diagnosis Difficulty walking;Abnormality of gait   PT Problem List Decreased activity tolerance;Decreased balance;Decreased mobility;Decreased coordination;Decreased cognition  PT Treatment Interventions DME instruction;Gait training;Stair training;Functional mobility training;Therapeutic activities;Therapeutic exercise;Balance training;Cognitive remediation;Patient/family education   PT Goals (Current goals can be found in the Care Plan section) Acute Rehab PT Goals Patient Stated Goal: To brush teeth PT Goal Formulation: With patient/family Time For Goal Achievement: 01/26/15 Potential to Achieve Goals: Good    Frequency Min 3X/week   Barriers to discharge        Co-evaluation               End of Session Equipment Utilized During Treatment: Gait belt Activity Tolerance: Patient tolerated treatment well Patient left: in bed;with call bell/phone within reach;with bed alarm set;with family/visitor present Nurse Communication: Mobility status         Time: 1497-0263 PT Time Calculation (min) (ACUTE ONLY): 26 min   Charges:   PT Evaluation $Initial PT Evaluation Tier I: 1 Procedure PT Treatments $Self Care/Home Management: 8-22   PT G CodesDuncan Dull 19-Jan-2015, 4:07 PM Alben Deeds, [place] DPT  [phone]

## 2015-01-12 NOTE — Progress Notes (Signed)
PT Cancellation Note  Patient Details Name: Cynthia [name] MRN: [id number] DOB: 1935/12/24   Cancelled Treatment:    Reason Eval/Treat Not Completed: Medical issues which prohibited therapy, Per nursing patient with new symptoms, will hold PT evaluation until MD clearance.   Duncan Dull 01/12/2015, 7:48 AM Alben Deeds, PT DPT  [phone]

## 2015-01-12 NOTE — Discharge Summary (Signed)
[facility] Discharge Summary  Patient name: Cynthia [name] Medical record number: [id number] Date of birth: 1936/07/29 Age: 79 y.o. Gender: female Date of Admission: 01/11/2015  Date of Discharge: 01/13/15 Admitting Physician: Alveda Reasons, MD  Primary Care Provider: [name], MD Consultants: neurology  Indication for Hospitalization: neurology  Discharge Diagnoses/Problem List:  TIA L arm numbness Disorientation Numbness and tingling of L side of face  Disposition: Discharge home with [facility] physical therapy  Discharge Condition: Stable  Discharge Exam:  Temp: [98.8 F (37.1 C)-99.9 F (37.7 C)] 98.8 F (37.1 C) (05/31 0617) Pulse Rate: [71-86] 73 (05/31 0617) Resp: [16-18] 18 (05/31 0617) BP: (115-145)/(58-80) 117/70 mmHg (05/31 0617) SpO2: [94 %-97 %] 95 % (05/31 0617) Physical Exam: General: awake, alert, NAD, daughter at bedside Cardiovascular: RRR, systolic murmur, no rubs Respiratory: CTAB, no increased WOB Abdomen: flat, soft, NT/ND, +BS Extremities: WWP, no edema, +2DP Neuro: 5/5 UE and LE strength, negative rapid alternating hand movement test, negative finger to nose test, CN 2-12 grossly in tact, sensation slightly decreased on medial aspect of L hand compared to R, visual field testing normal, EOMI, PERRLA, speech normal, follows commands, AOx3 Psych: pleasant, good eye contact, mood stable, speech normal Skin: dry, intact, no rashes  MMSE: significant for 1/3 short term recall. Patient able to remember with prompt.  Brief Hospital Course:  Cynthia [name] is a 79 y.o. female that presented with Loss of short term memory and an episode of left sided numbness. PMH is significant for Cervical Cancer, Colon Cancer s/p hemicolectomy and in remission, HTN.   On exam, NIHSS 3, patient exhibited poor short term memory.  She had an unremarkable neurologic exam.  Her electrolytes were normal, blood alcohol  negative, EKG with q waves but this was unchanged from previous.  CT/MRI were negative for acute processes.  MRA with evidence of atherosclerosis in the anterior circulation and P1 stenosis.  Posterior cerebral arteries with atherosclerosis. Distal posterior a's poorly visualized- appear to have slow flow R>L.  Neurology was consulted who recommended EEG, Echo, Carotid dopplers.  Patient was allowed to be permissively hypertensive while stroke evaluation took place.  EEG was unremarkable.  Carotid dopplers were unremarkable.  Echo showed Grade 1 diastolic dysfunction with EF 65-70%, mild TR, and a trivial pericardial effusion.  As stroke/TIA workup was essentially negative for cause, neurology recommended that patient follow up outpatient for continued surveillance of her L facial numbness.  Patient was evaluated by PT/OT.  Home health PT was recommended at discharge.  Patient endorsed intermittent "black spots" in her L lower visual fields that had been present before hospitalization during times of stress.  On exam, she exhibited no visual field deficits.  However, PT noted that patient had difficulty seeing object in her left visual field.  Upon reevaluation patient's symptoms had again resolved.  We discussed that it would be in her best interest to follow up with opthalmology should she continue to have these symptoms.    Discharge instructions and return precautions were reviewed with patient and her daughter.  Patient was discharged in stable condition with instructions to follow up closely with her PCP and neurology.  Patient and daughter voiced good understanding.  Issues for Follow Up:  1. MMSE, short term memory 2. Consider referral to opthalmology if continued floaters in visual field 3. Will need outpatient neurology referral for continued evaluation/management of L face/L hand numbness 4. CBGs/A1c.  A1c in hospital 5.8 5. Tolerance of Lipitor.  Patient started on Lipitor 40mg   Significant  Procedures: none  Significant Labs and Imaging:   Recent Labs Lab 01/11/15 1200 01/11/15 1207 01/11/15 1920  WBC 6.4  --   --   HGB 13.4 14.6  --   HCT 40.6 43.0 36.9  PLT 206  --   --     Recent Labs Lab 01/11/15 1200 01/11/15 1207 01/11/15 1920  NA 139 140  --   K 4.1 4.0  --   CL 103 104  --   CO2 25  --   --   GLUCOSE 93 95  --   BUN 13 14  --   CREATININE 0.74 0.80  --   CALCIUM 9.2  --   --   MG  --   --  1.8  PHOS  --   --  4.2  ALKPHOS 42  --   --   AST 24  --   --   ALT 13*  --   --   ALBUMIN 4.0  --   --    Lipid Panel     Component Value Date/Time   CHOL 245* 01/12/2015 1400   TRIG 60 01/12/2015 1400   HDL 69 01/12/2015 1400   CHOLHDL 3.6 01/12/2015 1400   VLDL 12 01/12/2015 1400   LDLCALC 164* 01/12/2015 1400   Iron/TIBC/Ferritin/ %Sat    Component Value Date/Time   IRON 10* 09/03/2013 1425   TIBC 444 09/03/2013 1425   FERRITIN 3* 09/03/2013 1425   IRONPCTSAT 2* 09/03/2013 1425   B12: 168 Hgb A1c: 5.8 TSH: 1.802 Mg: 1.8 Phos: 4.2 Ammonia: 18 Ethanol: <5  Drugs of Abuse     Component Value Date/Time   LABOPIA NONE DETECTED 01/11/2015 1725   COCAINSCRNUR NONE DETECTED 01/11/2015 1725   LABBENZ NONE DETECTED 01/11/2015 1725   AMPHETMU NONE DETECTED 01/11/2015 1725   THCU NONE DETECTED 01/11/2015 1725   LABBARB NONE DETECTED 01/11/2015 1725    Urinalysis    Component Value Date/Time   COLORURINE YELLOW 01/11/2015 [place] 01/11/2015 1725   LABSPEC 1.008 01/11/2015 1725   PHURINE 7.0 01/11/2015 1725   GLUCOSEU NEGATIVE 01/11/2015 1725   HGBUR NEGATIVE 01/11/2015 1725   BILIRUBINUR NEGATIVE 01/11/2015 1725   KETONESUR NEGATIVE 01/11/2015 1725   PROTEINUR NEGATIVE 01/11/2015 1725   UROBILINOGEN 0.2 01/11/2015 1725   NITRITE NEGATIVE 01/11/2015 [place] 01/11/2015 1725   2D echo: Study Conclusions - Left ventricle: The cavity size was normal. There was mild concentric hypertrophy.  Systolic function was vigorous. The estimated ejection fraction was in the range of 65% to 70%. Wall motion was normal; there were no regional wall motion abnormalities. Doppler parameters are consistent with abnormal left ventricular relaxation (grade 1 diastolic dysfunction). - Aortic valve: There was no regurgitation. - Mitral valve: Structurally normal valve. There was no regurgitation. - Left atrium: The atrium was normal in size. - Right ventricle: The cavity size was normal. Wall thickness was normal. Systolic function was normal. - Right atrium: The atrium was normal in size. - Atrial septum: No defect or patent foramen ovale was identified. - Tricuspid valve: Structurally normal valve. There was mild regurgitation. - Pulmonic valve: There was no regurgitation. - Pulmonary arteries: Systolic pressure was within the normal range. - Inferior vena cava: The vessel was normal in size. - Pericardium, extracardiac: A trivial pericardial effusion was identified posterior to the heart. Features were not consistent with tamponade physiology.  Carotid dopplers: Preliminary findings: Bilateral:  1-39% ICA stenosis. Vertebral artery flow is antegrade.  EEG:  The study was performed during wakefulness and drowsiness. No activating procedures performed.  Description:In the wakeful state, the best background consisted of a relatively low amplitude, posterior dominant, well sustained, symmetric and reactive 16 Hz rhythm (fast alpha, normal variant). Drowsiness demonstrated dropout of the alpha rhythm. No focal or generalized epileptiform discharges noted.  No pathologic areas of slowing seen.  EKG showed sinus rhythm.  Impression: this is a normal awake and drowsy EEG. Please, be aware that a normal EEG does not exclude the possibility of epilepsy.   Ct Head Wo Contrast  01/12/2015   CLINICAL DATA:  Dizziness with numbness and weakness, beginning 01/11/2015.  Recurrence of symptoms today with numbness in LEFT face and tingling in LEFT fingers. Patient complains of seeing black spot in RIGHT eye.  EXAM: CT HEAD WITHOUT CONTRAST  TECHNIQUE: Contiguous axial images were obtained from the base of the skull through the vertex without intravenous contrast.  COMPARISON:  MR brain 01/11/2015.  CT head 01/11/2015.  FINDINGS: No evidence for acute infarction, hemorrhage, mass lesion, hydrocephalus, or extra-axial fluid. Age-related atrophic changes are stable.  Hypoattenuation of white matter representing small vessel disease without features to suggest PRES or acute subcortical infarction. No subarachnoid blood.  Calcific atherosclerosis of the proximal intracranial vessels. Unremarkable orbits, sinuses, and mastoids.  IMPRESSION: Stable exam. No acute intracranial findings. No features to suggest proximal large vessel occlusion.   Electronically Signed   By: Rolla Flatten M.D.   On: 01/12/2015 08:39   Ct Head Wo Contrast  01/11/2015   CLINICAL DATA:  Code stroke, right facial numbness, confusion, left-sided weakness  EXAM: CT HEAD WITHOUT CONTRAST  TECHNIQUE: Contiguous axial images were obtained from the base of the skull through the vertex without intravenous contrast.  COMPARISON:  None.  FINDINGS: No evidence of parenchymal hemorrhage or extra-axial fluid collection. No mass lesion, mass effect, or midline shift.  No CT evidence of acute infarction.  Subcortical white matter and periventricular small vessel ischemic changes. Mild intracranial atherosclerosis.  Global cortical atrophy.  No ventriculomegaly.  The visualized paranasal sinuses are essentially clear. The mastoid air cells are unopacified.  No evidence of calvarial fracture.  IMPRESSION: No evidence of acute intracranial abnormality.  Atrophy with small vessel ischemic changes.  These results were called by telephone at the time of interpretation on 01/11/2015 at 12:10 pm to Dr. Jim Like, who verbally  acknowledged these results.   Electronically Signed   By: Julian Hy M.D.   On: 01/11/2015 12:11   Mr Jodene Nam Head Wo Contrast  01/11/2015   CLINICAL DATA:  Follow-up.  History of colon cancer, hypertension.  EXAM: MRA HEAD WITHOUT CONTRAST  TECHNIQUE: Angiographic images of the Circle of Willis were obtained using MRA technique without intravenous contrast.  COMPARISON:  MRI brain Jan 11, 2015 at 1241 hours of  FINDINGS: Anterior circulation: Normal flow related enhancement of the included cervical, petrous, cavernous and supra clinoid internal carotid arteries. Patent anterior communicating artery. Normal flow related enhancement of the anterior and middle cerebral arteries, including more distal segments.  No large vessel occlusion, high-grade stenosis, aneurysm. Moderate luminal irregularity of the anterior and middle cerebral arteries  Posterior circulation: RIGHT vertebral artery is dominant. Basilar artery is patent, with normal flow related enhancement of the main branch vessels. Normal flow related enhancement of the posterior cerebral arteries.  No high-grade stenosis, abnormal luminal irregularity, aneurysm. Mild to moderate stenosis RIGHT P1 segment, with thready  flow related enhancement of P3 segment, poor visualization RIGHT P4 segment, thready LEFT P3 and P4 segments.  IMPRESSION: Moderate luminal irregularity of the anterior circulation most consistent with atherosclerosis.  Poor visualization of RIGHT greater than LEFT distal posterior cerebral arteries favoring slow flow, less likely occlusion. Mild to moderate RIGHT  P1 stenosis. Mild luminal irregularity of the posterior cerebral arteries consistent with atherosclerosis.   Electronically Signed   By: Elon Alas M.D.   On: 01/11/2015 22:21   Mr Brain Wo Contrast  01/11/2015   CLINICAL DATA:  Acute onset of confusion while at church earlier today. LEFT arm numbness.  EXAM: MRI HEAD WITHOUT CONTRAST  TECHNIQUE: Multiplanar, multiecho  pulse sequences of the brain and surrounding structures were obtained without intravenous contrast.  COMPARISON:  CT head 01/11/2015.  FINDINGS: No evidence for acute infarction, hemorrhage, mass lesion, hydrocephalus, or extra-axial fluid. Moderate cerebral and cerebellar atrophy. Moderate subcortical and periventricular T2 and FLAIR hyperintensities, likely chronic microvascular ischemic change. Chronic lacunar infarct RIGHT basal ganglia. Prominent perivascular spaces. Flow voids are maintained throughout the carotid, basilar, and vertebral arteries. There are no areas of chronic hemorrhage. Partial empty sella. No tonsillar herniation. No upper cervical lesions. Extracranial soft tissues unremarkable.  IMPRESSION: Chronic changes as described.  No acute intracranial findings.  Specifically no evidence for acute stroke.   Electronically Signed   By: Rolla Flatten M.D.   On: 01/11/2015 13:31    Results/Tests Pending at Time of Discharge: none  Discharge Medications:    Medication List    STOP taking these medications        loperamide 2 MG capsule  Commonly known as:  IMODIUM     ondansetron 8 MG disintegrating tablet  Commonly known as:  ZOFRAN ODT      TAKE these medications        aspirin EC 81 MG tablet  Take 1 tablet (81 mg total) by mouth daily.     atorvastatin 40 MG tablet  Commonly known as:  LIPITOR  Take 1 tablet (40 mg total) by mouth daily.     metoprolol succinate 25 MG 24 hr tablet  Commonly known as:  TOPROL-XL  Take 25 mg by mouth every evening.        Discharge Instructions: Please refer to Patient Instructions section of EMR for full details.  Patient was counseled important signs and symptoms that should prompt return to medical care, changes in medications, dietary instructions, activity restrictions, and follow up appointments.   Follow-Up Appointments: Follow-up Information    Follow up with [name], MD. Schedule an appointment as soon as  possible for a visit in 3 days.   Specialty:  Family Medicine   Why:  hospital follow up   Contact information:   [place] [address] [phone]       Schedule an appointment as soon as possible for a visit with Neurology.   Why:  L hand numbness/ face numbness      Janora Norlander, DO 01/15/2015, 4:54 PM PGY-1, [place]

## 2015-01-12 NOTE — Progress Notes (Signed)
OT Cancellation Note  Patient Details Name: Cynthia [name] MRN: [id number] DOB: [birth year]   Cancelled Treatment:    Reason Eval/Treat Not Completed: Patient not medically ready (RN requesting AM hold- Ot to continue to check back)  Pt with new symptoms at this time and not appropriate for evaluation    Peri Maris  Pager: [phone]  01/12/2015, 7:52 AM

## 2015-01-12 NOTE — Progress Notes (Signed)
EEG completed, results pending. 

## 2015-01-12 NOTE — Progress Notes (Signed)
Family Medicine Teaching Service Daily Progress Note Intern Pager: [phone]  Patient name: Cynthia [name] Medical record number: [id number] Date of birth: 05-03-1936 Age: 79 y.o. Gender: female  Primary Care Provider: [name], MD Consultants: neurology Code Status: partial (DNI)  Pt Overview and Major Events to Date:  05/29: admitted  Assessment and Plan: Cynthia [name] is a 79 y.o. female presenting with Loss of short term memory and an episode of left sided numbness. PMH is significant for Cervical Cancer, Colon Cancer s/p hemicolectomy and in remission, HTN.   # Acute Encephalopathy - HTN to 200/120 by EMS at [facility]. NIHSS - 3 on admission.  Neurological evaluation without any focal deficits. MRI/ CT without evidence of stroke. Electrolytes normal.  MRA with evidence of atherosclerosis in the anterior circulation and P1 stenosis/posterior cerebral arteries with atherosclerosis.  Distal posterior a's poorly visualized- appear to have slow flow R>L - telemetry - Holding off on infectious workup given the transient nature of her symptoms and little evidence of other abnormality on initial testing.  - ASA - Holding Lovenox for 24 hours.  - c/s neurology, appreciate recs - Awaiting Carotid dopplers & 2D echocardiogram  - Awaiting EEG - Neuro checks per protocol - PT/OT - High intensity statin deferred to PCP, Dr. Thayer [name]  # HTN - On Metoprolol 25mg  XL at home. Continue here.   FEN/GI: NPO until swallow screen.  Prophylaxis: SCD's and then Lovenox after 24 hours.   Disposition: Observation pending further workup and resolution of symptoms.  Subjective:  Patient reports to me that she is feeling ok this am.  She notes that she has numbness in her L hand on the medial 3 fingers.  "It feels [name] my hand is trying to fall asleep".  She also reports intermittent "black spots" in her vision, particularly on the left lower visual fields.  She reports that she has had  this before.  It comes and goes when she is stressed.  We discussed patient's memory with daughter.  Daughter reports that her mother's memory is usually really good.  She does not get lost and is able to perform all ADLs independently.  Patient feels [name] "lots of stuff has been happening" to her body since she was diagnosed with cancer.  Upon examination, she was no longer seeing black spots.  Objective: Temp:  [97.7 F (36.5 C)-99.3 F (37.4 C)] 98.6 F (37 C) (05/30 0630) Pulse Rate:  [64-84] 76 (05/30 0630) Resp:  [14-18] 18 (05/30 0630) BP: (123-185)/(56-92) 139/77 mmHg (05/30 0630) SpO2:  [94 %-100 %] 94 % (05/30 0630) Weight:  [136 lb 8 oz (61.916 kg)] 136 lb 8 oz (61.916 kg) (05/29 2050) Physical Exam: General: awake, alert, NAD, daughter at bedside Cardiovascular: RRR, systolic murmur, no rubs Respiratory: CTAB, no increased WOB Abdomen: flat, soft, NT/ND, +BS Extremities: WWP, no edema,  +2DP Neuro: 5/5 UE and LE strength, negative rapid alternating hand movement test, negative finger to nose test, CN 2-12 grossly in tact, sensation slightly decreased on medial aspect of L hand compared to R, visual field testing normal, EOMI, PERRLA, speech normal, follows commands  MMSE: significant for 0/3 short term recall.  Patient able to remember with prompt.  Laboratory:  Recent Labs Lab 01/11/15 1200 01/11/15 1207  WBC 6.4  --   HGB 13.4 14.6  HCT 40.6 43.0  PLT 206  --     Recent Labs Lab 01/11/15 1200 01/11/15 1207  NA 139 140  K 4.1 4.0  CL 103  104  CO2 25  --   BUN 13 14  CREATININE 0.74 0.80  CALCIUM 9.2  --   PROT 7.4  --   BILITOT 0.5  --   ALKPHOS 42  --   ALT 13*  --   AST 24  --   GLUCOSE 93 95   TSH 1.802 Vit B12: 168 Mg 1.8 Phos: 4.2 Ammonia: 18 UDS neg UA neg ETOH neg  Imaging/Diagnostic Tests: Ct Head Wo Contrast  01/11/2015   CLINICAL DATA:  Code stroke, right facial numbness, confusion, left-sided weakness  EXAM: CT HEAD WITHOUT  CONTRAST  TECHNIQUE: Contiguous axial images were obtained from the base of the skull through the vertex without intravenous contrast.  COMPARISON:  None.  FINDINGS: No evidence of parenchymal hemorrhage or extra-axial fluid collection. No mass lesion, mass effect, or midline shift.  No CT evidence of acute infarction.  Subcortical white matter and periventricular small vessel ischemic changes. Mild intracranial atherosclerosis.  Global cortical atrophy.  No ventriculomegaly.  The visualized paranasal sinuses are essentially clear. The mastoid air cells are unopacified.  No evidence of calvarial fracture.  IMPRESSION: No evidence of acute intracranial abnormality.  Atrophy with small vessel ischemic changes.  These results were called by telephone at the time of interpretation on 01/11/2015 at 12:10 pm to Cynthia [name], who verbally acknowledged these results.   Electronically Signed   By: Julian Hy M.D.   On: 01/11/2015 12:11   Mr Jodene Nam Head Wo Contrast  01/11/2015   CLINICAL DATA:  Follow-up.  History of colon cancer, hypertension.  EXAM: MRA HEAD WITHOUT CONTRAST  TECHNIQUE: Angiographic images of the Circle of Willis were obtained using MRA technique without intravenous contrast.  COMPARISON:  MRI brain Jan 11, 2015 at 1241 hours of  FINDINGS: Anterior circulation: Normal flow related enhancement of the included cervical, petrous, cavernous and supra clinoid internal carotid arteries. Patent anterior communicating artery. Normal flow related enhancement of the anterior and middle cerebral arteries, including more distal segments.  No large vessel occlusion, high-grade stenosis, aneurysm. Moderate luminal irregularity of the anterior and middle cerebral arteries  Posterior circulation: RIGHT vertebral artery is dominant. Basilar artery is patent, with normal flow related enhancement of the main branch vessels. Normal flow related enhancement of the posterior cerebral arteries.  No high-grade stenosis,  abnormal luminal irregularity, aneurysm. Mild to moderate stenosis RIGHT P1 segment, with thready flow related enhancement of P3 segment, poor visualization RIGHT P4 segment, thready LEFT P3 and P4 segments.  IMPRESSION: Moderate luminal irregularity of the anterior circulation most consistent with atherosclerosis.  Poor visualization of RIGHT greater than LEFT distal posterior cerebral arteries favoring slow flow, less likely occlusion. Mild to moderate RIGHT  P1 stenosis. Mild luminal irregularity of the posterior cerebral arteries consistent with atherosclerosis.   Electronically Signed   By: Elon Alas M.D.   On: 01/11/2015 22:21   Mr Brain Wo Contrast  01/11/2015   CLINICAL DATA:  Acute onset of confusion while at church earlier today. LEFT arm numbness.  EXAM: MRI HEAD WITHOUT CONTRAST  TECHNIQUE: Multiplanar, multiecho pulse sequences of the brain and surrounding structures were obtained without intravenous contrast.  COMPARISON:  CT head 01/11/2015.  FINDINGS: No evidence for acute infarction, hemorrhage, mass lesion, hydrocephalus, or extra-axial fluid. Moderate cerebral and cerebellar atrophy. Moderate subcortical and periventricular T2 and FLAIR hyperintensities, likely chronic microvascular ischemic change. Chronic lacunar infarct RIGHT basal ganglia. Prominent perivascular spaces. Flow voids are maintained throughout the carotid, basilar, and vertebral arteries. There  are no areas of chronic hemorrhage. Partial empty sella. No tonsillar herniation. No upper cervical lesions. Extracranial soft tissues unremarkable.  IMPRESSION: Chronic changes as described.  No acute intracranial findings.  Specifically no evidence for acute stroke.   Electronically Signed   By: Rolla Flatten M.D.   On: 01/11/2015 13:31   Janora Norlander, DO 01/12/2015, 8:00 AM PGY-1, [place] Intern pager: [phone], text pages welcome

## 2015-01-12 NOTE — Evaluation (Signed)
Clinical/Bedside Swallow Evaluation Patient Details  Name: Cynthia [name] MRN: [id number] Date of Birth: August 08, 1936  Today's Date: 01/12/2015 Time: SLP Start Time (ACUTE ONLY): 27 SLP Stop Time (ACUTE ONLY): 1030 SLP Time Calculation (min) (ACUTE ONLY): 10 min  Past Medical History:  Past Medical History  Diagnosis Date  . Cancer     cervical ca  . Anemia   . Blood transfusion without reported diagnosis jan 2015  . Colon cancer   . Heart murmur   . Hypertension     no bp meds   . Liver cyst   . Complication of anesthesia     slow to awaken in past   Past Surgical History:  Past Surgical History  Procedure Laterality Date  . Abdominal hysterectomy      vaginal- partial  . Back surgery  1981    lower lumbar  . Tubal ligation  1968  . Laparoscopic right colectomy Right 10/22/2013    Procedure: LAPAROSCOPIC RIGHT COLECTOMY ;  Surgeon: [name], MD;  Location: [facility];  Service: General;  Laterality: Right;  . Salpingoophorectomy  10/22/2013    Procedure: [place] Saa;  Surgeon: [name]. [name], MD;  Location: [facility];  Service: Gynecology;;   HPI:  Cynthia [name] is a 79 y.o. female presenting with Loss of short term memory and an episode of right sided numbness. PMH is significant for Cervical Cancer, Colon Cancer s/p hemicolectomy and in remission, HTN. MRI negative. New left sided facial and LUE numbness reported in am on 5/30.    Assessment / Plan / Recommendation Clinical Impression  Pt demonstrates normal swallow function. No signs of aspiration observed.     Aspiration Risk  Mild    Diet Recommendation  (regular/thin)   Medication Administration: Whole meds with liquid    Other  Recommendations Oral Care Recommendations: Patient independent with oral care   Follow Up Recommendations       Frequency and Duration        Pertinent Vitals/Pain NA    SLP Swallow Goals     Swallow Study Prior Functional Status        General Other Pertinent Information: Cynthia [name] is a 79 y.o. female presenting with Loss of short term memory and an episode of right sided numbness. PMH is significant for Cervical Cancer, Colon Cancer s/p hemicolectomy and in remission, HTN. MRI negative. New left sided facial and LUE numbness reported in am on 5/30.  Type of Study: Bedside swallow evaluation Diet Prior to this Study: NPO Temperature Spikes Noted: No Respiratory Status: Room air Behavior/Cognition: Alert;Cooperative;Pleasant mood Oral Cavity - Dentition: Adequate natural dentition/normal for age Self-Feeding Abilities: Able to feed self Patient Positioning: Upright in bed Baseline Vocal Quality: Normal Volitional Cough: Strong Volitional Swallow: Able to elicit    Oral/Motor/Sensory Function Overall Oral Motor/Sensory Function: Appears within functional limits for tasks assessed   Ice Chips     Thin Liquid Thin Liquid: Within functional limits Presentation: Cup;Self Fed;Straw    Nectar Thick Nectar Thick Liquid: Not tested   Honey Thick Honey Thick Liquid: Not tested   Puree Puree: Within functional limits   Solid   GO    Solid: Within functional limits      [facility], MA CCC-SLP [phone]  Cynthia [name], Katherene Ponto 01/12/2015,10:40 AM

## 2015-01-12 NOTE — Progress Notes (Addendum)
Patient c/o numbness on the left face and tingling in her left fingers. She reported that she feels like her left fingers are going to sleep and they seem to get tighter.Pt also c/o seeing "black spot" on right eye"Patient is alert and oriented at this time. Daughter at bedside at this time. Attending MD and Neurologist Paged. Dr. Nicole Kindred called back with order.    Ave Filter, RN

## 2015-01-12 NOTE — Progress Notes (Signed)
**  Interval Note**  Additionally, below is patient's clock from this am's exam    Feiga Nadel M. Lajuana Ripple, DO PGY-1, [place]

## 2015-01-12 NOTE — Procedures (Signed)
EEG report.  Brief clinical history: 79 y.o. female with history of HTN, colon cancer presenting with acute onset of left sided sensory loss and confusion. BP noted to be 200/120 upon EMS arrival. MRI brain shows no acute infarct. Currently confusion has resolved and left sided numbness improving.   Technique: this is a 17 channel routine scalp EEG performed at the bedside with bipolar and monopolar montages arranged in accordance to the international 10/20 system of electrode placement. One channel was dedicated to EKG recording.  The study was performed during wakefulness and drowsiness. No activating procedures performed.  Description:In the wakeful state, the best background consisted of a relatively low amplitude, posterior dominant, well sustained, symmetric and reactive 16 Hz rhythm (fast alpha, normal variant). Drowsiness demonstrated dropout of the alpha rhythm. No focal or generalized epileptiform discharges noted.  No pathologic areas of slowing seen.  EKG showed sinus rhythm.  Impression: this is a normal awake and drowsy EEG. Please, be aware that a normal EEG does not exclude the possibility of epilepsy.  Clinical correlation is advised.   Dorian Pod, MD

## 2015-01-12 NOTE — Progress Notes (Signed)
  Echocardiogram 2D Echocardiogram has been performed.  Cynthia [name] 01/12/2015, 1:35 PM

## 2015-01-12 NOTE — Evaluation (Signed)
Occupational Therapy Evaluation Patient Details Name: Cynthia [name] MRN: [id number] DOB: 01/26/1936 Today's Date: 01/12/2015    History of Present Illness 79 y.o. female presenting with loss of short term memory and left sided numbness. PMH: Cervical Cancer, Colon Cancer s/p hemicolectomy and in remission, HTN   Clinical Impression   Pt has mild balance deficits (leans left) during functional mobility.  Cuing required for placement at sink during grooming task due to lack of awareness of left side in space. Education provided on safety during ADLs with decreased sensation on L side (face and UE). Pt reports seeing small black dots on L side while sitting in bed, BP was 145/85. Pt reports black dots disappeared after grooming activity and BP decreased to 133/80 while seated at end of session. Pt would benefit from skilled OT services to address sensation deficits on L side and balance during functional tasks.    Follow Up Recommendations  Outpatient OT    Equipment Recommendations       Recommendations for Other Services       Precautions / Restrictions Precautions Precautions: Fall Restrictions Weight Bearing Restrictions: No      Mobility Bed Mobility Overal bed mobility: Independent                Transfers Overall transfer level: Needs assistance Equipment used: None Transfers: Sit to/from Stand Sit to Stand: Min guard              Balance Overall balance assessment: Needs assistance Sitting-balance support: Single extremity supported;Feet supported       Standing balance support: Single extremity supported;During functional activity                                ADL Overall ADL's : Needs assistance/impaired Eating/Feeding: Independent   Grooming: Wash/dry hands;Wash/dry face;Oral care;Cueing for safety;Standing;Min guard Grooming Details (indicate cue type and reason): Pt required cuing for body alignment at sink due to L  neglect Upper Body Bathing: Sitting;Min guard   Lower Body Bathing: Sit to/from stand;Min guard   Upper Body Dressing : Sitting;Supervision/safety   Lower Body Dressing: Min guard;Sit to/from stand   Toilet Transfer: Nature conservation officer;Ambulation   Toileting- Clothing Manipulation and Hygiene: Min guard;Sit to/from stand   Tub/ Banker: Civil engineer, contracting;Ambulation;Tub transfer;Grab bars   Functional mobility during ADLs: Minimal assistance General ADL Comments: Pt required cues to align body at sink and bumped into items on L side. Was able to complete grooming task while maintaining balance.     Vision Vision Assessment?: Yes Eye Alignment: Within Functional Limits Alignment/Gaze Preference: Within Defined Limits Tracking/Visual Pursuits: Able to track stimulus in all quads without difficulty Convergence: Within functional limits Visual Fields: No apparent deficits Additional Comments: Pt reports seeing small black dots in L eye when BP 145/85. Dots disappear when seated at end of session and BP 133/80.    Perception     Praxis      Pertinent Vitals/Pain Pain Assessment: No/denies pain     Hand Dominance Right   Extremity/Trunk Assessment Upper Extremity Assessment Upper Extremity Assessment: LUE deficits/detail LUE Deficits / Details: Pt reports numbness and tingling in L hand and digits3-5   Lower Extremity Assessment Lower Extremity Assessment: Defer to PT evaluation       Communication Communication Communication: No difficulties   Cognition Arousal/Alertness: Awake/alert Behavior During Therapy: WFL for tasks assessed/performed Overall Cognitive Status: Within Functional Limits for tasks assessed  General Comments       Exercises       Shoulder Instructions      Home Living Family/patient expects to be discharged to:: Private residence Living Arrangements: Children (Lives with Daughter) Available Help at  Discharge: Family;Available PRN/intermittently (Daughter can adjust schedule to meet Pt needs) Type of Home: House Home Access: Stairs to enter [employer] of Steps: 3 Entrance Stairs-Rails: None Home Layout: One level     Bathroom Shower/Tub: Teacher, early years/pre: Standard     Home Equipment: None          Prior Functioning/Environment Level of Independence: Independent             OT Diagnosis: Generalized weakness;Disturbance of vision   OT Problem List: Impaired balance (sitting and/or standing);Decreased safety awareness;Impaired sensation   OT Treatment/Interventions: Self-care/ADL training;Therapeutic exercise;DME and/or AE instruction;Therapeutic activities;Patient/family education;Balance training    OT Goals(Current goals can be found in the care plan section) Acute Rehab OT Goals Patient Stated Goal: To brush teeth OT Goal Formulation: With patient Time For Goal Achievement: 01/26/15 Potential to Achieve Goals: Good ADL Goals Pt Will Perform Lower Body Dressing: with modified independence;sit to/from stand (maintain balance) Pt Will Perform Tub/Shower Transfer: with min guard assist;Tub transfer;ambulating;shower seat Additional ADL Goal #1: Pt will locate 4 out of 5 items on left side of body during functional activity.  OT Frequency: Min 3X/week   Barriers to D/C:            Co-evaluation              End of Session Equipment Utilized During Treatment: Gait belt  Activity Tolerance: Patient tolerated treatment well Patient left:  (with transport)   Time: 1240-1310 OT Time Calculation (min): 30 min Charges:  OT General Charges $OT Visit: 1 Procedure OT Treatments $Self Care/Home Management : 8-22 mins G-Codes:    Forest Gleason 01/12/2015, 2:03 PM

## 2015-01-13 LAB — FOLATE RBC
FOLATE, RBC: 1042 ng/mL (ref >498)
Folate, Hemolysate: 384.6 ng/mL
Hematocrit: 36.9 % (ref 34.0–46.6)

## 2015-01-13 LAB — HEMOGLOBIN A1C
HEMOGLOBIN A1C: 5.8 % — AB (ref 4.8–5.6)
Mean Plasma Glucose: 120 mg/dL

## 2015-01-13 MED ORDER — ASPIRIN EC 81 MG PO TBEC: 81.0000 mg | DELAYED_RELEASE_TABLET | Freq: Every day | ORAL | Status: DC

## 2015-01-13 MED ORDER — ATORVASTATIN CALCIUM 40 MG PO TABS: 40.0000 mg | ORAL_TABLET | Freq: Every day | ORAL | Status: DC

## 2015-01-13 NOTE — Care Management Note (Signed)
Case Management Note  Patient Details  Name: JAZSMIN COUSE MRN: [id number] Date of Birth: 03/30/1936  Subjective/Objective:                    Action/Plan:  Met with patient and family to discuss home health needs. Patient has chosen [facility]. Miranda with [facility] was notified and has accepted the referral for probable discharge home later today.  Patient's contact number is [phone]. Expected Discharge Date:                  Expected Discharge Plan:  [place]  In-House Referral:     Discharge planning Services  CM Consult  Post Acute Care Choice:    Choice offered to:  Patient  DME Arranged:    DME Agency:     HH Arranged:  PT, OT HH Agency:  [place]  Status of Service:  Completed, signed off  Medicare Important Message Given:  Yes Date Medicare IM Given:  01/13/15 Medicare IM give by:  Lorne Skeens RN, MSN, CM Date Additional Medicare IM Given:    Additional Medicare Important Message give by:     If discussed at [place] of Stay Meetings, dates discussed:    Additional Comments:  Rolm Baptise, RN 01/13/2015, 10:51 AM

## 2015-01-13 NOTE — Progress Notes (Signed)
Patient refused bath this AM verbalizing that she is being discharge and will take care this at home.  Ave Filter, RN

## 2015-01-13 NOTE — Progress Notes (Signed)
Patient denied seeing black spots in left eye but reported seeing "carnation" characterized by a white area then red string as she move her head and eventually fade away. RN asked if this is the first time and she said no and stated "it happened yesterday and I thought I told you". Daughter at bedside confirmed that she was with pt all day and denied patient reporting this to her. Family is concern and want MD to know. MD paged.   Ave Filter, RN

## 2015-01-13 NOTE — Progress Notes (Signed)
Family Medicine Teaching Service Daily Progress Note Intern Pager: [phone]  Patient name: Cynthia [name] Medical record number: [id number] Date of birth: August 12, 1936 Age: 79 y.o. Gender: female  Primary Care Provider: [name], MD Consultants: neurology Code Status: partial (DNI)  Pt Overview and Major Events to Date:  05/29: admitted  Assessment and Plan: Cynthia [name] is a 79 y.o. female presenting with Loss of short term memory and an episode of left sided numbness. PMH is significant for Cervical Cancer, Colon Cancer s/p hemicolectomy and in remission, HTN.   # Acute Encephalopathy - HTN to 200/120 by EMS at [facility]. NIHSS - 3 on admission.  Neurological evaluation without any focal deficits. MRI/ CT without evidence of stroke. Electrolytes normal.  MRA with evidence of atherosclerosis in the anterior circulation and P1 stenosis/posterior cerebral arteries with atherosclerosis.  Distal posterior a's poorly visualized- appear to have slow flow R>L. Carotid doppler, EEG unremarkable.  Echo showed Grade 1 diastolic dysfunction with EF 65-70%, mild TR, and a trivial pericardial effusion - telemetry - Holding off on infectious workup given the transient nature of her symptoms and little evidence of other abnormality on initial testing.  - [facility] - c/s neurology, appreciate recs - Neuro checks per protocol - PT/OT - High intensity statin deferred to PCP, Dr. Thayer Jew  # HTN - BP controlled.  On Metoprolol 25mg  XL at home. Continue here.   FEN/GI: NPO until swallow screen.  Prophylaxis: SCD's and then Lovenox after 24 hours.   Disposition: Likely discharge home today.  Subjective:  Patient reports to me that she is better this am.  She is no longer seeing black spots.  She continues to reports a "numb" feeling in her L cheek and medial aspect of her L hand but on examination she reports that sensation feels the same bilaterally, there is just an underlying numb feeling  there.  Patient denies any other concerns at this time.  Objective: Temp:  [98.8 F (37.1 C)-99.9 F (37.7 C)] 98.8 F (37.1 C) (05/31 0617) Pulse Rate:  [71-86] 73 (05/31 0617) Resp:  [16-18] 18 (05/31 0617) BP: (115-145)/(58-80) 117/70 mmHg (05/31 0617) SpO2:  [94 %-97 %] 95 % (05/31 0617) Physical Exam: General: awake, alert, NAD, daughter at bedside Cardiovascular: RRR, systolic murmur, no rubs Respiratory: CTAB, no increased WOB Abdomen: flat, soft, NT/ND, +BS Extremities: WWP, no edema,  +2DP Neuro: 5/5 UE and LE strength, negative rapid alternating hand movement test, negative finger to nose test, CN 2-12 grossly in tact, sensation slightly decreased on medial aspect of L hand compared to R, visual field testing normal, EOMI, PERRLA, speech normal, follows commands, AOx3  MMSE: significant for 1/3 short term recall.  Patient able to remember with prompt.  Laboratory:  Recent Labs Lab 01/11/15 1200 01/11/15 1207  WBC 6.4  --   HGB 13.4 14.6  HCT 40.6 43.0  PLT 206  --     Recent Labs Lab 01/11/15 1200 01/11/15 1207  NA 139 140  K 4.1 4.0  CL 103 104  CO2 25  --   BUN 13 14  CREATININE 0.74 0.80  CALCIUM 9.2  --   PROT 7.4  --   BILITOT 0.5  --   ALKPHOS 42  --   ALT 13*  --   AST 24  --   GLUCOSE 93 95   TSH 1.802 Vit B12: 168 Mg 1.8 Phos: 4.2 Ammonia: 18 UDS neg UA neg ETOH neg  Imaging/Diagnostic Tests: Ct Head Wo Contrast  01/12/2015   CLINICAL DATA:  Dizziness with numbness and weakness, beginning 01/11/2015. Recurrence of symptoms today with numbness in LEFT face and tingling in LEFT fingers. Patient complains of seeing black spot in RIGHT eye.  EXAM: CT HEAD WITHOUT CONTRAST  TECHNIQUE: Contiguous axial images were obtained from the base of the skull through the vertex without intravenous contrast.  COMPARISON:  MR brain 01/11/2015.  CT head 01/11/2015.  FINDINGS: No evidence for acute infarction, hemorrhage, mass lesion, hydrocephalus, or  extra-axial fluid. Age-related atrophic changes are stable.  Hypoattenuation of white matter representing small vessel disease without features to suggest PRES or acute subcortical infarction. No subarachnoid blood.  Calcific atherosclerosis of the proximal intracranial vessels. Unremarkable orbits, sinuses, and mastoids.  IMPRESSION: Stable exam. No acute intracranial findings. No features to suggest proximal large vessel occlusion.   Electronically Signed   By: Rolla Flatten M.D.   On: 01/12/2015 08:39   Ct Head Wo Contrast  01/11/2015   CLINICAL DATA:  Code stroke, right facial numbness, confusion, left-sided weakness  EXAM: CT HEAD WITHOUT CONTRAST  TECHNIQUE: Contiguous axial images were obtained from the base of the skull through the vertex without intravenous contrast.  COMPARISON:  None.  FINDINGS: No evidence of parenchymal hemorrhage or extra-axial fluid collection. No mass lesion, mass effect, or midline shift.  No CT evidence of acute infarction.  Subcortical white matter and periventricular small vessel ischemic changes. Mild intracranial atherosclerosis.  Global cortical atrophy.  No ventriculomegaly.  The visualized paranasal sinuses are essentially clear. The mastoid air cells are unopacified.  No evidence of calvarial fracture.  IMPRESSION: No evidence of acute intracranial abnormality.  Atrophy with small vessel ischemic changes.  These results were called by telephone at the time of interpretation on 01/11/2015 at 12:10 pm to Dr. Jim Like, who verbally acknowledged these results.   Electronically Signed   By: Julian Hy M.D.   On: 01/11/2015 12:11   Mr Jodene Nam Head Wo Contrast  01/11/2015   CLINICAL DATA:  Follow-up.  History of colon cancer, hypertension.  EXAM: MRA HEAD WITHOUT CONTRAST  TECHNIQUE: Angiographic images of the Circle of Willis were obtained using MRA technique without intravenous contrast.  COMPARISON:  MRI brain Jan 11, 2015 at 1241 hours of  FINDINGS: Anterior  circulation: Normal flow related enhancement of the included cervical, petrous, cavernous and supra clinoid internal carotid arteries. Patent anterior communicating artery. Normal flow related enhancement of the anterior and middle cerebral arteries, including more distal segments.  No large vessel occlusion, high-grade stenosis, aneurysm. Moderate luminal irregularity of the anterior and middle cerebral arteries  Posterior circulation: RIGHT vertebral artery is dominant. Basilar artery is patent, with normal flow related enhancement of the main branch vessels. Normal flow related enhancement of the posterior cerebral arteries.  No high-grade stenosis, abnormal luminal irregularity, aneurysm. Mild to moderate stenosis RIGHT P1 segment, with thready flow related enhancement of P3 segment, poor visualization RIGHT P4 segment, thready LEFT P3 and P4 segments.  IMPRESSION: Moderate luminal irregularity of the anterior circulation most consistent with atherosclerosis.  Poor visualization of RIGHT greater than LEFT distal posterior cerebral arteries favoring slow flow, less likely occlusion. Mild to moderate RIGHT  P1 stenosis. Mild luminal irregularity of the posterior cerebral arteries consistent with atherosclerosis.   Electronically Signed   By: Elon Alas M.D.   On: 01/11/2015 22:21   Mr Brain Wo Contrast  01/11/2015   CLINICAL DATA:  Acute onset of confusion while at church earlier today. LEFT arm numbness.  EXAM: MRI HEAD WITHOUT CONTRAST  TECHNIQUE: Multiplanar, multiecho pulse sequences of the brain and surrounding structures were obtained without intravenous contrast.  COMPARISON:  CT head 01/11/2015.  FINDINGS: No evidence for acute infarction, hemorrhage, mass lesion, hydrocephalus, or extra-axial fluid. Moderate cerebral and cerebellar atrophy. Moderate subcortical and periventricular T2 and FLAIR hyperintensities, likely chronic microvascular ischemic change. Chronic lacunar infarct RIGHT basal  ganglia. Prominent perivascular spaces. Flow voids are maintained throughout the carotid, basilar, and vertebral arteries. There are no areas of chronic hemorrhage. Partial empty sella. No tonsillar herniation. No upper cervical lesions. Extracranial soft tissues unremarkable.  IMPRESSION: Chronic changes as described.  No acute intracranial findings.  Specifically no evidence for acute stroke.   Electronically Signed   By: Rolla Flatten M.D.   On: 01/11/2015 13:31   Janora Norlander, DO 01/13/2015, 8:35 AM PGY-1, [place] Intern pager: [phone], text pages welcome

## 2015-01-13 NOTE — Progress Notes (Signed)
Occupational Therapy Treatment Patient Details Name: Cynthia [name] MRN: [id number] DOB: March 11, 1936 Today's Date: 01/13/2015    History of present illness 79 y.o. female presenting with loss of short term memory and left sided numbness. PMH: Cervical Cancer, Colon Cancer s/p hemicolectomy and in remission, HTN   OT comments  Pt in bed upon arrival, reporting seeing red flower in left visual field that moves.  BP 132/80 laying. Pt performed grooming task at sink, requiring cues to find items on L side of counter. Assessed vision during focusing activities while seated, Pt reports seeing red flower more frequently when focusing on written tasks. Pt reports red flower is carnation shaped and blinks/ jumps to left. Would benefit from continued OT service for L neglect during ADLs.  Follow Up Recommendations  Outpatient OT    Equipment Recommendations       Recommendations for Other Services      Precautions / Restrictions Precautions Precautions: Fall Restrictions Weight Bearing Restrictions: No       Mobility Bed Mobility Overal bed mobility: Independent                Transfers Overall transfer level: Needs assistance Equipment used: None Transfers: Sit to/from Stand Sit to Stand: Min guard              Balance Overall balance assessment: Needs assistance Sitting-balance support: Feet supported       Standing balance support: Single extremity supported;During functional activity                       ADL Overall ADL's : Needs assistance/impaired     Grooming: Wash/dry hands;Wash/dry face;Oral care;Min guard;Standing Grooming Details (indicate cue type and reason): Pt. states numbness on L side of mouth and neck while grooming. Upper Body Bathing: Min guard;Standing                           Functional mobility during ADLs: Min guard General ADL Comments: Pt aware of L sided numbness during grooming and bathing task. Pt able to  find items on L side of sink with cuing.      Vision                 Additional Comments: Pt reports seeing red carnation flower in L visual field. States flower blinks and jumps, and is not constant. Pt sees red flower more often when focusing on visual task. Pt provided letter cancellation task and able to scan locate letters. Pt required cues to recall instructions midway through task. pt started using information obtained from first line on the third line and needed cues back to the instructions. Pt provided clock with time. pt needed min cues to recall instructions of time to draw. Pt able to correctly draw clock. Pt reports seeing flower to the L side of therapist face, left side of handout, and with focused attention disappears. Pt able to fill in 2 out 3 items ( unable to recognize a smiley face but recognize house and flower) Pt able to return demo drawing house and flower. Pt able to see flower with R and L eye occlusion for monocular vision.    Perception     Praxis      Cognition   Behavior During Therapy: WFL for tasks assessed/performed Overall Cognitive Status: Within Functional Limits for tasks assessed  Extremity/Trunk Assessment               Exercises     Shoulder Instructions       General Comments      Pertinent Vitals/ Pain       Pain Assessment: No/denies pain  Home Living                                          Prior Functioning/Environment              Frequency Min 3X/week     Progress Toward Goals  OT Goals(current goals can now be found in the care plan section)     Acute Rehab OT Goals Patient Stated Goal: to get out of bed OT Goal Formulation: With patient ADL Goals Pt Will Perform Lower Body Dressing: with modified independence;sit to/from stand Pt Will Perform Tub/Shower Transfer: with min guard assist;Tub transfer;ambulating;shower seat Additional ADL Goal #1: Pt will  locate 4 out of 5 items on left side of body during functional activity.  Plan      Co-evaluation                 End of Session Equipment Utilized During Treatment: Gait belt   Activity Tolerance Patient tolerated treatment well   Patient Left in chair;with call bell/phone within reach   Nurse Communication          Time: [phone] OT Time Calculation (min): 40 min  Charges: OT General Charges $OT Visit: 1 Procedure OT Treatments $Self Care/Home Management : 8-22 mins $Therapeutic Activity: 23-37 mins  Forest Gleason 01/13/2015, 11:36 AM

## 2015-01-13 NOTE — Discharge Instructions (Signed)
You were admitted for altered mental status and numbness in your left upper extremity that was concerning for stroke/TIA.  You had imaging studies of your head, which revealed no acute processes (meaning no stroke).  Echocardiogram, EEG, Carotid dopplers were all unremarkable and did not demonstrate any findings to explain your symptoms.  You should follow up with your primary care doctor in the next week.  We recommend that you have an outpatient opthalmology appointment if you continue to see black spots.  In addition, you should see neurology outpatient for continued monitoring/management of the numbness in your L fingers.  Paresthesia Paresthesia is a burning or prickling feeling. This feeling can happen in any part of the body. It often happens in the hands, arms, legs, or feet. HOME CARE  Avoid drinking alcohol.  Try massage or needle therapy (acupuncture) to help with your problems.  Keep all doctor visits as told. GET HELP RIGHT AWAY IF:   You feel weak.  You have trouble walking or moving.  You have problems speaking or seeing.  You feel confused.  You cannot control when you poop (bowel movement) or pee (urinate).  You lose feeling (numbness) after an injury.  You pass out (faint).  Your burning or prickling feeling gets worse when you walk.  You have pain, cramps, or feel dizzy.  You have a rash. MAKE SURE YOU:   Understand these instructions.  Will watch your condition.  Will get help right away if you are not doing well or get worse. Document Released: 07/14/2008 Document Revised: 10/24/2011 Document Reviewed: 04/22/2011 [facility] Patient Information 2015 [place], [place]. This information is not intended to replace advice given to you by your health care provider. Make sure you discuss any questions you have with your health care provider.  Transient Ischemic Attack A transient ischemic attack (TIA) is a "warning stroke" that causes stroke-like symptoms. Unlike  a stroke, a TIA does not cause permanent damage to the brain. The symptoms of a TIA can happen very fast and do not last long. It is important to know the symptoms of a TIA and what to do. This can help prevent a major stroke or death. CAUSES   A TIA is caused by a temporary blockage in an artery in the brain or neck (carotid artery). The blockage does not allow the brain to get the blood supply it needs and can cause different symptoms. The blockage can be caused by either:  A blood clot.  Fatty buildup (plaque) in a neck or brain artery. RISK FACTORS  High blood pressure (hypertension).  High cholesterol.  Diabetes mellitus.  Heart disease.  The build up of plaque in the blood vessels (peripheral artery disease or atherosclerosis).  The build up of plaque in the blood vessels providing blood and oxygen to the brain (carotid artery stenosis).  An abnormal heart rhythm (atrial fibrillation).  Obesity.  Smoking.  Taking oral contraceptives (especially in combination with smoking).  Physical inactivity.  A diet high in fats, salt (sodium), and calories.  Alcohol use.  Use of illegal drugs (especially cocaine and methamphetamine).  Being female.  Being African American.  Being over the age of 15.  Family history of stroke.  Previous history of blood clots, stroke, TIA, or heart attack.  Sickle cell disease. SYMPTOMS  TIA symptoms are the same as a stroke but are temporary. These symptoms usually develop suddenly, or may be newly present upon awakening from sleep:  Sudden weakness or numbness of the face, arm,  or leg, especially on one side of the body.  Sudden trouble walking or difficulty moving arms or legs.  Sudden confusion.  Sudden personality changes.  Trouble speaking (aphasia) or understanding.  Difficulty swallowing.  Sudden trouble seeing in one or both eyes.  Double vision.  Dizziness.  Loss of balance or coordination.  Sudden severe  headache with no known cause.  Trouble reading or writing.  Loss of bowel or bladder control.  Loss of consciousness. DIAGNOSIS  Your caregiver may be able to determine the presence or absence of a TIA based on your symptoms, history, and physical exam. Computed tomography (CT scan) of the brain is usually performed to help identify a TIA. Other tests may be done to diagnose a TIA. These tests may include:  Electrocardiography.  Continuous heart monitoring.  Echocardiography.  Carotid ultrasonography.  Magnetic resonance imaging (MRI).  A scan of the brain circulation.  Blood tests. PREVENTION  The risk of a TIA can be decreased by appropriately treating high blood pressure, high cholesterol, diabetes, heart disease, and obesity and by quitting smoking, limiting alcohol, and staying physically active. TREATMENT  Time is of the essence. Since the symptoms of TIA are the same as a stroke, it is important to seek treatment as soon as possible because you may need a medicine to dissolve the clot (thrombolytic) that cannot be given if too much time has passed. Treatment options vary. Treatment options may include rest, oxygen, intravenous (IV) fluids, and medicines to thin the blood (anticoagulants). Medicines and diet may be used to address diabetes, high blood pressure, and other risk factors. Measures will be taken to prevent short-term and long-term complications, including infection from breathing foreign material into the lungs (aspiration pneumonia), blood clots in the legs, and falls. Treatment options include procedures to either remove plaque in the carotid arteries or dilate carotid arteries that have narrowed due to plaque. Those procedures are:  Carotid endarterectomy.  Carotid angioplasty and stenting. HOME CARE INSTRUCTIONS   Take all medicines prescribed by your caregiver. Follow the directions carefully. Medicines may be used to control risk factors for a stroke. Be sure  you understand all your medicine instructions.  You may be told to take aspirin or the anticoagulant warfarin. Warfarin needs to be taken exactly as instructed.  Taking too much or too little warfarin is dangerous. Too much warfarin increases the risk of bleeding. Too little warfarin continues to allow the risk for blood clots. While taking warfarin, you will need to have regular blood tests to measure your blood clotting time. A PT blood test measures how long it takes for blood to clot. Your PT is used to calculate another value called an INR. Your PT and INR help your caregiver to adjust your dose of warfarin. The dose can change for many reasons. It is critically important that you take warfarin exactly as prescribed.  Many foods, especially foods high in vitamin K can interfere with warfarin and affect the PT and INR. Foods high in vitamin K include spinach, kale, broccoli, cabbage, collard and turnip greens, brussels sprouts, peas, cauliflower, seaweed, and parsley as well as beef and pork liver, green tea, and soybean oil. You should eat a consistent amount of foods high in vitamin K. Avoid major changes in your diet, or notify your caregiver before changing your diet. Arrange a visit with a dietitian to answer your questions.  Many medicines can interfere with warfarin and affect the PT and INR. You must tell your caregiver about  any and all medicines you take, this includes all vitamins and supplements. Be especially cautious with aspirin and anti-inflammatory medicines. Do not take or discontinue any prescribed or over-the-counter medicine except on the advice of your caregiver or pharmacist.  Warfarin can have side effects, such as excessive bruising or bleeding. You will need to hold pressure over cuts for longer than usual. Your caregiver or pharmacist will discuss other potential side effects.  Avoid sports or activities that may cause injury or bleeding.  Be mindful when shaving,  flossing your teeth, or handling sharp objects.  Alcohol can change the body's ability to handle warfarin. It is best to avoid alcoholic drinks or consume only very small amounts while taking warfarin. Notify your caregiver if you change your alcohol intake.  Notify your dentist or other caregivers before procedures.  Eat a diet that includes 5 or more servings of fruits and vegetables each day. This may reduce the risk of stroke. Certain diets may be prescribed to address high blood pressure, high cholesterol, diabetes, or obesity.  A low-sodium, low-saturated fat, low-trans fat, low-cholesterol diet is recommended to manage high blood pressure.  A low-saturated fat, low-trans fat, low-cholesterol, and high-fiber diet may control cholesterol levels.  A controlled-carbohydrate, controlled-sugar diet is recommended to manage diabetes.  A reduced-calorie, low-sodium, low-saturated fat, low-trans fat, low-cholesterol diet is recommended to manage obesity.  Maintain a healthy weight.  Stay physically active. It is recommended that you get at least 30 minutes of activity on most or all days.  Do not smoke.  Limit alcohol use even if you are not taking warfarin. Moderate alcohol use is considered to be:  No more than 2 drinks each day for men.  No more than 1 drink each day for nonpregnant women.  Stop drug abuse.  Home safety. A safe home environment is important to reduce the risk of falls. Your caregiver may arrange for specialists to evaluate your home. Having grab bars in the bedroom and bathroom is often important. Your caregiver may arrange for equipment to be used at home, such as raised toilets and a seat for the shower.  Follow all instructions for follow-up with your caregiver. This is very important. This includes any referrals and lab tests. Proper follow up can prevent a stroke or another TIA from occurring. SEEK MEDICAL CARE IF:  You have personality changes.  You have  difficulty swallowing.  You are seeing double.  You have dizziness.  You have a fever.  You have skin breakdown. SEEK IMMEDIATE MEDICAL CARE IF:  Any of these symptoms may represent a serious problem that is an emergency. Do not wait to see if the symptoms will go away. Get medical help right away. Call your local emergency services (911 in [place] Do not drive yourself to the hospital.  You have sudden weakness or numbness of the face, arm, or leg, especially on one side of the body.  You have sudden trouble walking or difficulty moving arms or legs.  You have sudden confusion.  You have trouble speaking (aphasia) or understanding.  You have sudden trouble seeing in one or both eyes.  You have a loss of balance or coordination.  You have a sudden, severe headache with no known cause.  You have new chest pain or an irregular heartbeat.  You have a partial or total loss of consciousness. MAKE SURE YOU:   Understand these instructions.  Will watch your condition.  Will get help right away if you are not doing  well or get worse. Document Released: 05/11/2005 Document Revised: 08/06/2013 Document Reviewed: 11/06/2013 [facility] Patient Information 2015 [place], [place]. This information is not intended to replace advice given to you by your health care provider. Make sure you discuss any questions you have with your health care provider.

## 2015-01-13 NOTE — Progress Notes (Signed)
Discharge instructions reviewed with patient/family. All questions answered at this time. Equipment received. Transport by family.   Ave Filter, RN

## 2015-01-13 NOTE — Progress Notes (Signed)
Subjective: No changes over night. Continues to feel numbness on the left cheek and left fingers. No other complaints.   Objective: Current vital signs: BP 117/70 mmHg  Pulse 73  Temp(Src) 98.8 F (37.1 C) (Oral)  Resp 18  Ht 5\' 2"  (1.575 m)  Wt 61.916 kg (136 lb 8 oz)  BMI 24.96 kg/m2  SpO2 95% Vital signs in last 24 hours: Temp:  [98.8 F (37.1 C)-99.9 F (37.7 C)] 98.8 F (37.1 C) (05/31 0617) Pulse Rate:  [71-86] 73 (05/31 0617) Resp:  [16-18] 18 (05/31 0617) BP: (115-145)/(58-80) 117/70 mmHg (05/31 0617) SpO2:  [94 %-97 %] 95 % (05/31 0617)  Intake/Output from previous day: 05/30 0701 - 05/31 0700 In: 720 [P.O.:720] Out: -  Intake/Output this shift: Total I/O In: 360 [P.O.:360] Out: -  Nutritional status: Diet Heart Room service appropriate?: Yes; Fluid consistency:: Thin  Neurologic Exam: Mental Status: Alert, oriented, thought content appropriate. Speech fluent without evidence of aphasia. Able to follow 3 step commands without difficulty. Cranial Nerves: II: Visual fields grossly normal, pupils equal, round, reactive to light and accommodation III,IV, VI: ptosis not present, extra-ocular motions intact bilaterally V,VII: smile symmetric, facial light touch sensation decreased on the left cheek VIII: hearing normal bilaterally IX,X: uvula rises symmetrically XI: bilateral shoulder shrug XII: midline tongue extension without atrophy or fasciculations  Motor: Right :Upper extremity 5/5Left: Upper extremity 5/5 Lower extremity 5/5Lower extremity 5/5 Tone and bulk:normal tone throughout; no atrophy noted Sensory: Pinprick and light touch decreased in left fingers on left hand, otherwise intact.  Deep Tendon Reflexes:  Right: Upper Extremity Left: Upper extremity   biceps (C-5 to C-6) 2/4 biceps (C-5 to  C-6) 2/4 tricep (C7) 2/4triceps (C7) 2/4 Brachioradialis (C6) 2/4Brachioradialis (C6) 2/4  Lower Extremity Lower Extremity  quadriceps (L-2 to L-4) 2/4 quadriceps (L-2 to L-4) 2/4 Achilles (S1) 2/4Achilles (S1) 2/4  Plantars: Right: downgoingLeft: downgoing Cerebellar: normal finger-to-nose, normal heel-to-shin test   Lab Results: Basic Metabolic Panel:  Recent Labs Lab 01/11/15 1200 01/11/15 1207 01/11/15 1920  NA 139 140  --   K 4.1 4.0  --   CL 103 104  --   CO2 25  --   --   GLUCOSE 93 95  --   BUN 13 14  --   CREATININE 0.74 0.80  --   CALCIUM 9.2  --   --   MG  --   --  1.8  PHOS  --   --  4.2    Liver Function Tests:  Recent Labs Lab 01/11/15 1200  AST 24  ALT 13*  ALKPHOS 42  BILITOT 0.5  PROT 7.4  ALBUMIN 4.0   No results for input(s): LIPASE, AMYLASE in the last 168 hours.  Recent Labs Lab 01/11/15 1920  AMMONIA 18    CBC:  Recent Labs Lab 01/11/15 1200 01/11/15 1207  WBC 6.4  --   NEUTROABS 4.0  --   HGB 13.4 14.6  HCT 40.6 43.0  MCV 88.1  --   PLT 206  --     Cardiac Enzymes: No results for input(s): CKTOTAL, CKMB, CKMBINDEX, TROPONINI in the last 168 hours.  Lipid Panel:  Recent Labs Lab 01/12/15 1400  CHOL 245*  TRIG 60  HDL 69  CHOLHDL 3.6  VLDL 12  LDLCALC 164*    CBG: No results for input(s): GLUCAP in the last 168 hours.  Microbiology: Results for orders placed or performed in visit on 03/26/14  Clostridium Difficile by PCR  Status: Abnormal   Collection Time: 03/26/14  2:26 PM  Result Value Ref Range Status   C difficile by pcr Positive (AA) Negative Final    Comment:  This assay detects the presence of Clostridium difficile DNA codingfor toxin B (tcdB) by real-time polymerase chain reaction (PCR)amplification.This test was developed and its performance  characteristics have beendetermined by [employer].  Performance characteristics referto the analytical performance of the test. This test has not beencleared or approved by the [place] Food and Drug Administration. The FDAhas determined that such clearance or approval is not necessary. Thislaboratory is  certified under the [facility] as qualified to perform high complexity clinicallaboratory testing.CALLED B. MISENHEIMER MT 16:45 03/26/14 ([email or web address])    Coagulation Studies:  Recent Labs  01/11/15 1200  LABPROT 14.0  INR 1.06    Imaging: Ct Head Wo Contrast  01/12/2015   CLINICAL DATA:  Dizziness with numbness and weakness, beginning 01/11/2015. Recurrence of symptoms today with numbness in LEFT face and tingling in LEFT fingers. Patient complains of seeing black spot in RIGHT eye.  EXAM: CT HEAD WITHOUT CONTRAST  TECHNIQUE: Contiguous axial images were obtained from the base of the skull through the vertex without intravenous contrast.  COMPARISON:  MR brain 01/11/2015.  CT head 01/11/2015.  FINDINGS: No evidence for acute infarction, hemorrhage, mass lesion, hydrocephalus, or extra-axial fluid. Age-related atrophic changes are stable.  Hypoattenuation of white matter representing small vessel disease without features to suggest PRES or acute subcortical infarction. No subarachnoid blood.  Calcific atherosclerosis of the proximal intracranial vessels. Unremarkable orbits, sinuses, and mastoids.  IMPRESSION: Stable exam. No acute intracranial findings. No features to suggest proximal large vessel occlusion.   Electronically Signed   By: Rolla Flatten M.D.   On: 01/12/2015 08:39   Ct Head Wo Contrast  01/11/2015   CLINICAL DATA:  Code stroke, right facial numbness, confusion, left-sided weakness  EXAM: CT HEAD WITHOUT CONTRAST  TECHNIQUE: Contiguous axial images were obtained from the base of the skull through the vertex without intravenous contrast.   COMPARISON:  None.  FINDINGS: No evidence of parenchymal hemorrhage or extra-axial fluid collection. No mass lesion, mass effect, or midline shift.  No CT evidence of acute infarction.  Subcortical white matter and periventricular small vessel ischemic changes. Mild intracranial atherosclerosis.  Global cortical atrophy.  No ventriculomegaly.  The visualized paranasal sinuses are essentially clear. The mastoid air cells are unopacified.  No evidence of calvarial fracture.  IMPRESSION: No evidence of acute intracranial abnormality.  Atrophy with small vessel ischemic changes.  These results were called by telephone at the time of interpretation on 01/11/2015 at 12:10 pm to Dr. Jim Like, who verbally acknowledged these results.   Electronically Signed   By: Julian Hy M.D.   On: 01/11/2015 12:11   Mr Jodene Nam Head Wo Contrast  01/11/2015   CLINICAL DATA:  Follow-up.  History of colon cancer, hypertension.  EXAM: MRA HEAD WITHOUT CONTRAST  TECHNIQUE: Angiographic images of the Circle of Willis were obtained using MRA technique without intravenous contrast.  COMPARISON:  MRI brain Jan 11, 2015 at 1241 hours of  FINDINGS: Anterior circulation: Normal flow related enhancement of the included cervical, petrous, cavernous and supra clinoid internal carotid arteries. Patent anterior communicating artery. Normal flow related enhancement of the anterior and middle cerebral arteries, including more distal segments.  No large vessel occlusion, high-grade stenosis, aneurysm. Moderate luminal irregularity of the anterior and middle cerebral arteries  Posterior circulation: RIGHT vertebral artery is  dominant. Basilar artery is patent, with normal flow related enhancement of the main branch vessels. Normal flow related enhancement of the posterior cerebral arteries.  No high-grade stenosis, abnormal luminal irregularity, aneurysm. Mild to moderate stenosis RIGHT P1 segment, with thready flow related enhancement of P3  segment, poor visualization RIGHT P4 segment, thready LEFT P3 and P4 segments.  IMPRESSION: Moderate luminal irregularity of the anterior circulation most consistent with atherosclerosis.  Poor visualization of RIGHT greater than LEFT distal posterior cerebral arteries favoring slow flow, less likely occlusion. Mild to moderate RIGHT  P1 stenosis. Mild luminal irregularity of the posterior cerebral arteries consistent with atherosclerosis.   Electronically Signed   By: Elon Alas M.D.   On: 01/11/2015 22:21   Mr Brain Wo Contrast  01/11/2015   CLINICAL DATA:  Acute onset of confusion while at church earlier today. LEFT arm numbness.  EXAM: MRI HEAD WITHOUT CONTRAST  TECHNIQUE: Multiplanar, multiecho pulse sequences of the brain and surrounding structures were obtained without intravenous contrast.  COMPARISON:  CT head 01/11/2015.  FINDINGS: No evidence for acute infarction, hemorrhage, mass lesion, hydrocephalus, or extra-axial fluid. Moderate cerebral and cerebellar atrophy. Moderate subcortical and periventricular T2 and FLAIR hyperintensities, likely chronic microvascular ischemic change. Chronic lacunar infarct RIGHT basal ganglia. Prominent perivascular spaces. Flow voids are maintained throughout the carotid, basilar, and vertebral arteries. There are no areas of chronic hemorrhage. Partial empty sella. No tonsillar herniation. No upper cervical lesions. Extracranial soft tissues unremarkable.  IMPRESSION: Chronic changes as described.  No acute intracranial findings.  Specifically no evidence for acute stroke.   Electronically Signed   By: Rolla Flatten M.D.   On: 01/11/2015 13:31    EEG:  Normal and no electrographic seizures.   Echo: EF 65-70% with no PFO  Carotid doppler: 1-39 % stenosis bilaterally  LDL 164 A1c pending    Medications:  Scheduled: . aspirin  300 mg Rectal Daily   Or  . aspirin  325 mg Oral Daily  . heparin subcutaneous  5,000 Units Subcutaneous 3 times per day   . metoprolol succinate  25 mg Oral QPM    Assessment/Plan: 79 YO female with acute onset left sided numbness and confusion . Confusion has resolved but numbness remains. Stroke work up does not reveal etiology for patch numbness.   Recommend: 1) Continue BP control 2) Continue ASA daily 3) Follow up out patient neurology for further evaluation of numbness --possibly consider EMG/NCV as out patient.   Neurology will S/O  Etta Quill PA-C [facility] [phone]  01/13/2015, 8:46 AM  I personally participated in this patient's evaluation and management, including formulating the above clinical impression and management recommendations.  Rush Farmer M.D. [facility] [phone]

## 2015-01-29 ENCOUNTER — Ambulatory Visit: Payer: Self-pay | Admitting: Neurology

## 2015-01-29 ENCOUNTER — Encounter: Payer: Self-pay | Admitting: Neurology

## 2015-01-29 ENCOUNTER — Ambulatory Visit (INDEPENDENT_AMBULATORY_CARE_PROVIDER_SITE_OTHER): Payer: Medicare Other | Admitting: Neurology

## 2015-01-29 VITALS — BP 138/74 | HR 76 | Resp 18 | Ht 61.0 in | Wt 139.1 lb

## 2015-01-29 DIAGNOSIS — I639 Cerebral infarction, unspecified: Secondary | ICD-10-CM | POA: Insufficient documentation

## 2015-01-29 DIAGNOSIS — I10 Essential (primary) hypertension: Secondary | ICD-10-CM | POA: Diagnosis not present

## 2015-01-29 DIAGNOSIS — E538 Deficiency of other specified B group vitamins: Secondary | ICD-10-CM | POA: Diagnosis not present

## 2015-01-29 NOTE — Patient Instructions (Signed)
You probably had a stroke that was not picked up on MRI 1.  Continue aspirin 81mg  daily 2.  Continue Lipitor 40mg  daily.  Would recheck lipid panel in 3 months 3.  Continue B12 supplementation.  Follow up level with Dr. Stephanie Acre 4.  Mediterranean diet 5.  Routine exercise 6.  Follow up in 3 months.

## 2015-01-29 NOTE — Progress Notes (Signed)
NEUROLOGY CONSULTATION NOTE  Cynthia [name] MRN: [id number] DOB: 12/17/1935  Referring provider: Dr. Stephanie Acre Primary care provider: Dr. Stephanie Acre  Reason for consult:  TIA  HISTORY OF PRESENT ILLNESS: Cynthia [name] is a 79 year old right-handed woman with hypertension, hypercholesterolemia, mild MR, chronic low back pain and history of cervical cancer who presents for TIA.  She is accompanied by her daughter who provides some history.  She was admitted to [facility] on 01/11/15 for altered mental status.  She was sitting in church when suddenly she slumped over.  She reportedly said she could not see out of her left eye.  She has no recollection of this and does not remember anything until she was already in her hospital bed.  She had elevated blood pressure.  MRI of the brain showed moderate cerebral and cerebellar atrophy with chronic small vessel disease but no acute infarcts or bleed.  MRA of the head showed moderate atherosclerosis in the anterior circulation.  Carotid doppler showed less than 40% bilateral stenosis.  Hgb A1c was 5.8.  LDL was 164.  She was started on ASA 81mg  daily and Lipitor 40mg  daily.  She reported left sided numbness involving the face, arm and leg.  At this time, she still notes numbness and tingling involving the face and arm.  Vision has resolved.  She was also found to have a B12 level of 168.  She received shots in the hospital.  She currently is taking 1073mcg daily.  Her PCP plans to follow up the level, and if not much improved, will likely switch to injections.  PAST MEDICAL HISTORY: Past Medical History  Diagnosis Date  . Cancer     cervical ca  . Anemia   . Blood transfusion without reported diagnosis jan 2015  . Colon cancer   . Heart murmur   . Hypertension     no bp meds   . Liver cyst   . Complication of anesthesia     slow to awaken in past  . Colon cancer   . Cervical cancer     PAST SURGICAL HISTORY: Past Surgical  History  Procedure Laterality Date  . Abdominal hysterectomy      vaginal- partial  . Back surgery  1981    lower lumbar  . Tubal ligation  1968  . Laparoscopic right colectomy Right 10/22/2013    Procedure: LAPAROSCOPIC RIGHT COLECTOMY ;  Surgeon: [name], MD;  Location: [facility];  Service: General;  Laterality: Right;  . Salpingoophorectomy  10/22/2013    Procedure: Marquette Saa;  Surgeon: [name]. [name], MD;  Location: [facility];  Service: Gynecology;;    MEDICATIONS: Current Outpatient Prescriptions on File Prior to Visit  Medication Sig Dispense Refill  . aspirin EC 81 MG tablet Take 1 tablet (81 mg total) by mouth daily. 90 tablet 0  . atorvastatin (LIPITOR) 40 MG tablet Take 1 tablet (40 mg total) by mouth daily. 90 tablet 0  . metoprolol succinate (TOPROL-XL) 25 MG 24 hr tablet Take 25 mg by mouth every evening.     No current facility-administered medications on file prior to visit.    ALLERGIES: No Known Allergies  FAMILY HISTORY: Family History  Problem Relation Age of Onset  . Diabetes Sister   . Cancer Sister     leukemia  . Diabetes Brother   . Heart failure Brother   . Hypertension Brother   . Breast cancer Maternal Aunt   . Rheum arthritis Mother   .  Heart failure Father   . Cancer Maternal Grandmother     GIST  . Heart failure Maternal Grandfather   . Aneurysm Paternal Grandmother     SOCIAL HISTORY: History   Social History  . Marital Status: Widowed    Spouse Name: N/A  . Number of Children: N/A  . Years of Education: N/A   Occupational History  . Not on file.   Social History Main Topics  . Smoking status: Never Smoker   . Smokeless tobacco: Never Used  . Alcohol Use: No  . Drug Use: No  . Sexual Activity: No   Other Topics Concern  . Not on file   Social History Narrative    REVIEW OF SYSTEMS: Constitutional: No fevers, chills, or sweats, no generalized fatigue, change in appetite Eyes: No visual changes,  double vision, eye pain Ear, nose and throat: No hearing loss, ear pain, nasal congestion, sore throat Cardiovascular: No chest pain, palpitations Respiratory:  No shortness of breath at rest or with exertion, wheezes GastrointestinaI: No nausea, vomiting, diarrhea, abdominal pain, fecal incontinence Genitourinary:  No dysuria, urinary retention or frequency Musculoskeletal:  No neck pain, back pain Integumentary: No rash, pruritus, skin lesions Neurological: as above Psychiatric: No depression, insomnia, anxiety Endocrine: No palpitations, fatigue, diaphoresis, mood swings, change in appetite, change in weight, increased thirst Hematologic/Lymphatic:  No anemia, purpura, petechiae. Allergic/Immunologic: no itchy/runny eyes, nasal congestion, recent allergic reactions, rashes  PHYSICAL EXAM: Filed Vitals:   01/29/15 1506  BP: 138/74  Pulse: 76  Resp: 18   General: No acute distress Head:  Normocephalic/atraumatic Eyes:  fundi unremarkable, without vessel changes, exudates, hemorrhages or papilledema. Neck: supple, no paraspinal tenderness, full range of motion Back: No paraspinal tenderness Heart: regular rate and rhythm Lungs: Clear to auscultation bilaterally. Vascular: No carotid bruits. Neurological Exam: Mental status: alert and oriented to person, place, and time, recent and remote memory intact, fund of knowledge intact, attention and concentration intact, speech fluent and not dysarthric, language intact. Cranial nerves: CN I: not tested CN II: pupils equal, round and reactive to light, visual fields intact, fundi unremarkable, without vessel changes, exudates, hemorrhages or papilledema. CN III, IV, VI:  full range of motion, no nystagmus, no ptosis CN V: decreased left V1-V3 sensation CN VII: upper and lower face symmetric CN VIII: hearing intact CN IX, X: gag intact, uvula midline CN XI: sternocleidomastoid and trapezius muscles intact CN XII: tongue midline Bulk &  Tone: normal, no fasciculations. Motor:  5/5 throughout Sensation:  Pinprick and vibration intact Deep Tendon Reflexes:  2+ throughout, toes downgoing Finger to nose testing:  No dysmetria Heel to shin:  No dysmetria Gait:  Normal station and stride.  Some minor unsteadiness with tandem walking. Romberg negative.  IMPRESSION: Probable CVA not picked up on diffusion-weighted imaging B12 deficiency.  Likely incidental finding.  The symptoms were of sudden onset and numbness is unilateral. Hypertension.  Blood pressure is a little elevated, which she says is due to white coat syndrome.  BP checks at home are low.  PLAN: Continue ASA 81mg  daily Continue Liptor 40mg  (LDL goal should be less than 100.  Recheck fasting lipid panel in 3 months) Monitor B12 as per PCP Mediterranean diet Routine exercise Monitor BP with PCP Follow up in 3 months  Thank you for allowing me to take part in the care of this patient.  Metta Clines, DO  CC:  Jonathon Jordan, MD

## 2015-04-16 ENCOUNTER — Other Ambulatory Visit (HOSPITAL_BASED_OUTPATIENT_CLINIC_OR_DEPARTMENT_OTHER): Payer: Medicare Other

## 2015-04-16 ENCOUNTER — Ambulatory Visit (HOSPITAL_BASED_OUTPATIENT_CLINIC_OR_DEPARTMENT_OTHER): Payer: Medicare Other | Admitting: Nurse Practitioner

## 2015-04-16 ENCOUNTER — Encounter: Payer: Self-pay | Admitting: Nurse Practitioner

## 2015-04-16 ENCOUNTER — Telehealth: Payer: Self-pay | Admitting: Nurse Practitioner

## 2015-04-16 VITALS — BP 162/82 | HR 72 | Temp 98.3°F | Resp 18 | Ht 61.0 in | Wt 142.7 lb

## 2015-04-16 DIAGNOSIS — Z85038 Personal history of other malignant neoplasm of large intestine: Secondary | ICD-10-CM | POA: Diagnosis not present

## 2015-04-16 DIAGNOSIS — C189 Malignant neoplasm of colon, unspecified: Secondary | ICD-10-CM

## 2015-04-16 NOTE — Progress Notes (Signed)
  [facility] OFFICE PROGRESS NOTE   Diagnosis:  Colon cancer  INTERVAL HISTORY:   Cynthia [name] returns as scheduled. She reports having a stroke earlier this year.. She is now followed by neurology. She reports residual left facial numbness and left visual deficit.  Bowels moving regularly. She reports having a colonoscopy in February of this year. No bloody or black stools. No pain with bowel movements. No nausea or vomiting. No abdominal pain except after eating nuts.  Objective:  Vital signs in last 24 hours:  Blood pressure 162/82, pulse 72, temperature 98.3 F (36.8 C), temperature source Oral, resp. rate 18, height _0  (1.549 m), weight 142 lb 11.2 oz (64.728 kg), SpO2 96 %.    HEENT: No thrush or ulcers. Lymphatics: No palpable cervical, supra cervical, axillary or inguinal lymph nodes. Resp: Lungs clear bilaterally. Cardio: Regular rate and rhythm. GI: Abdomen soft and nontender. No organomegaly. No mass. Vascular: No leg edema.  Lab Results:  Lab Results  Component Value Date   WBC 6.4 01/11/2015   HGB 14.6 01/11/2015   HCT 36.9 01/11/2015   MCV 88.1 01/11/2015   PLT 206 01/11/2015   NEUTROABS 4.0 01/11/2015    Imaging:  No results found.  Medications: I have reviewed the patient's current medications.  Assessment/Plan: 1. Moderately differentiated adenocarcinoma of the cecum, stage III (T4a, N1), status post a laparoscopic assisted right colectomy 10/22/2013  The tumor returned microsatellite stable with normal mismatch repair protein expression   Cycle 1 adjuvant Xeloda 12/25/2013.   Cycle 2 adjuvant Xeloda 01/15/2014.   Cycle 3 adjuvant Xeloda 02/05/2014.   Cycle 4 adjuvant Xeloda 03/18/2014, discontinued after 4 days secondary to diarrhea   Cycle 5 adjuvant Xeloda 04/16/2014, Xeloda dose reduced to 1000 mg in the a.m. and 500 mg in the p.m.   Cycle 6 adjuvant Xeloda 05/07/2014.  Cycle 7 adjuvant Xeloda  05/28/2014.  Cycle 8 adjuvant Xeloda 06/18/2014.  Restaging CTs on 07/14/2014 with no evidence of metastatic colon cancer 2. Right ovary cystadenofibroma, status post a bilateral oophorectomy 10/22/2013 3. Remote history of cervical cancer. 4. Iron deficiency anemia-resolved. 5. Right calf deep vein thrombosis 10/24/2013-she completed 3 months of xarelto. 6. Indeterminate 12 mm hypermetabolic right pelvic density on the staging PET scan 10/04/2013-CT followup recommended per the GI tumor conference 11/13/2013. No longer visualized on a CT 07/14/2014. 7. Soft tissue implant overlying the posterior right liver on the CT 11/73/5670-LID hypermetabolic on the PET scan 10/13/3141. 8. C. difficile colitis 03/05/2014. She completed a course of Flagyl, recurrent diarrhea beginning 03/21/2014, positive C. difficile toxin 03/26/2014-resumed Flagyl for planned 10 day course.  Vancomycin beginning 04/08/2014.  Taper complete 05/21/2014.   Disposition: Cynthia [name] remains in clinical remission from colon cancer. We will follow-up on the CEA from today. We are referring her for surveillance CT scans in 3 months. She will return for a follow-up visit a few days after the scans to review the results. She will contact the office in the interim with any problems.  Plan reviewed with Dr. Benay Spice.    Ned Card ANP/GNP-BC   04/16/2015  10:59 AM

## 2015-04-16 NOTE — Telephone Encounter (Signed)
per pof to sch pt appt-gave pt copy of avs-gave pt contrast-adv cntral sch will call to sch scan

## 2015-04-16 NOTE — Progress Notes (Signed)
Checked in pt for front desk. Pt made pymt of $40 via [employer].receipt given. Pt signed Aob.

## 2015-04-17 LAB — CEA: CEA: 2.6 ng/mL (ref 0.0–5.0)

## 2015-05-15 ENCOUNTER — Encounter: Payer: Self-pay | Admitting: Neurology

## 2015-05-15 ENCOUNTER — Encounter: Payer: Self-pay | Admitting: *Deleted

## 2015-05-15 ENCOUNTER — Ambulatory Visit (INDEPENDENT_AMBULATORY_CARE_PROVIDER_SITE_OTHER): Payer: Medicare Other | Admitting: Neurology

## 2015-05-15 VITALS — BP 180/100 | HR 80 | Ht 61.0 in | Wt 142.4 lb

## 2015-05-15 DIAGNOSIS — I10 Essential (primary) hypertension: Secondary | ICD-10-CM | POA: Diagnosis not present

## 2015-05-15 DIAGNOSIS — I639 Cerebral infarction, unspecified: Secondary | ICD-10-CM | POA: Diagnosis not present

## 2015-05-15 DIAGNOSIS — E7800 Pure hypercholesterolemia, unspecified: Secondary | ICD-10-CM

## 2015-05-15 NOTE — Patient Instructions (Signed)
Continue aspirin 81mg  daily Continue Lipitor 40mg  daily (have a fasting lipid panel performed with your next blood draw in November) Mediterranean diet Routine exercise (use the treadmill) Monitor blood pressure Follow up in May

## 2015-05-15 NOTE — Progress Notes (Signed)
NEUROLOGY FOLLOW UP OFFICE NOTE  Cynthia [name] [id number]  HISTORY OF PRESENT ILLNESS: Cynthia [name] is a 79 year old right-handed woman with hypertension, hypercholesterolemia, mild MR, chronic low back pain and history of cervical cancer who follows up for CVA.  UPDATE: She takes ASA 81mg  and Lipitor 40mg .  She is doing well.  She denies new symptoms.  HISTORY: She was admitted to [facility] on 01/11/15 for altered mental status.  She was sitting in church when suddenly she slumped over.  She reportedly said she could not see out of her left eye.  She has no recollection of this and does not remember anything until she was already in her hospital bed.  She had elevated blood pressure.  MRI of the brain showed moderate cerebral and cerebellar atrophy with chronic small vessel disease but no acute infarcts or bleed.  MRA of the head showed moderate atherosclerosis in the anterior circulation.  Carotid doppler showed less than 40% bilateral stenosis.  Hgb A1c was 5.8.  LDL was 164.  She was started on ASA 81mg  daily and Lipitor 40mg  daily.  She reported left sided numbness involving the face, arm and leg.  At this time, she still notes numbness and tingling involving the face and arm.  Vision has resolved.  She was also found to have a B12 level of 168.  She received shots in the hospital.  She currently is taking 1022mcg daily.  Her PCP plans to follow up the level, and if not much improved, will likely switch to injections.  PAST MEDICAL HISTORY: Past Medical History  Diagnosis Date  . Cancer     cervical ca  . Anemia   . Blood transfusion without reported diagnosis jan 2015  . Colon cancer   . Heart murmur   . Hypertension     no bp meds   . Liver cyst   . Complication of anesthesia     slow to awaken in past  . Colon cancer   . Cervical cancer     MEDICATIONS: Current Outpatient Prescriptions on File Prior to Visit  Medication Sig Dispense Refill  .  aspirin EC 81 MG tablet Take 1 tablet (81 mg total) by mouth daily. 90 tablet 0  . atorvastatin (LIPITOR) 40 MG tablet Take 1 tablet (40 mg total) by mouth daily. 90 tablet 0  . metoprolol succinate (TOPROL-XL) 25 MG 24 hr tablet Take 25 mg by mouth every evening.    . Vitamin D, Ergocalciferol, (DRISDOL) 50000 UNITS CAPS capsule Take 50,000 Units by mouth 1 day or 1 dose.     No current facility-administered medications on file prior to visit.    ALLERGIES: No Known Allergies  FAMILY HISTORY: Family History  Problem Relation Age of Onset  . Diabetes Sister   . Cancer Sister     leukemia  . Diabetes Brother   . Heart failure Brother   . Hypertension Brother   . Breast cancer Maternal Aunt   . Rheum arthritis Mother   . Heart failure Father   . Cancer Maternal Grandmother     GIST  . Heart failure Maternal Grandfather   . Aneurysm Paternal Grandmother     SOCIAL HISTORY: Social History   Social History  . Marital Status: Widowed    Spouse Name: N/A  . Number of Children: N/A  . Years of Education: N/A   Occupational History  . Not on file.   Social History Main Topics  . Smoking  status: Never Smoker   . Smokeless tobacco: Never Used  . Alcohol Use: No  . Drug Use: No  . Sexual Activity: No   Other Topics Concern  . Not on file   Social History Narrative    REVIEW OF SYSTEMS: Constitutional: No fevers, chills, or sweats, no generalized fatigue, change in appetite Eyes: No visual changes, double vision, eye pain Ear, nose and throat: No hearing loss, ear pain, nasal congestion, sore throat Cardiovascular: No chest pain, palpitations Respiratory:  No shortness of breath at rest or with exertion, wheezes GastrointestinaI: No nausea, vomiting, diarrhea, abdominal pain, fecal incontinence Genitourinary:  No dysuria, urinary retention or frequency Musculoskeletal:  No neck pain, back pain Integumentary: No rash, pruritus, skin lesions Neurological: as  above Psychiatric: No depression, insomnia, anxiety Endocrine: No palpitations, fatigue, diaphoresis, mood swings, change in appetite, change in weight, increased thirst Hematologic/Lymphatic:  No anemia, purpura, petechiae. Allergic/Immunologic: no itchy/runny eyes, nasal congestion, recent allergic reactions, rashes  PHYSICAL EXAM: Filed Vitals:   05/15/15 1104  BP: 180/100  Pulse: 80   General: No acute distress.  Patient appears well-groomed.   Head:  Normocephalic/atraumatic Eyes:  Fundoscopic exam unremarkable without vessel changes, exudates, hemorrhages or papilledema. Neck: supple, no paraspinal tenderness, full range of motion Heart:  Regular rate and rhythm Lungs:  Clear to auscultation bilaterally Back: No paraspinal tenderness Neurological Exam: alert and oriented to person, place, and time. Attention span and concentration intact, recent and remote memory intact, fund of knowledge intact.  Speech fluent and not dysarthric, language intact.  CN II-XII intact. Fundoscopic exam unremarkable without vessel changes, exudates, hemorrhages or papilledema.  Bulk and tone normal, muscle strength 5/5 throughout.  Sensation to pinprick reduced in left foot.  Otherwise, pinprick and vibration intact.  Deep tendon reflexes 2+ throughout, toes downgoing.  Finger to nose and heel to shin testing intact.  Gait normal, Romberg negative.  IMPRESSION: Probable CVA not picked up on diffusion-weighted imaging Hypertension.  Blood pressure is a little elevated, which she says is due to white coat syndrome.  BP checks at home are low. Hyperlipidemia  PLAN: Continue ASA 81mg  daily Continue Liptor 40mg  (LDL goal should be less than 100.  She said she is having blood work done in November.  Asked that fasting lipid panel be repeated) Monitor B12 as per PCP Mediterranean diet Routine exercise Monitor BP with PCP Follow up in May  15 minutes spent face to face with patient, over 50% spent  discussing management.  Metta Clines, DO  CC:  Jonathon Jordan, MD

## 2015-07-16 ENCOUNTER — Ambulatory Visit (HOSPITAL_COMMUNITY)
Admission: RE | Admit: 2015-07-16 | Discharge: 2015-07-16 | Disposition: A | Discharge status: Home / Self Care | Payer: Medicare Other | Source: Ambulatory Visit | Attending: Nurse Practitioner | Admitting: Nurse Practitioner

## 2015-07-16 ENCOUNTER — Encounter (HOSPITAL_COMMUNITY): Payer: Self-pay

## 2015-07-16 ENCOUNTER — Other Ambulatory Visit (HOSPITAL_BASED_OUTPATIENT_CLINIC_OR_DEPARTMENT_OTHER): Payer: Medicare Other

## 2015-07-16 DIAGNOSIS — R160 Hepatomegaly, not elsewhere classified: Secondary | ICD-10-CM | POA: Insufficient documentation

## 2015-07-16 DIAGNOSIS — Z85038 Personal history of other malignant neoplasm of large intestine: Secondary | ICD-10-CM | POA: Insufficient documentation

## 2015-07-16 DIAGNOSIS — Z08 Encounter for follow-up examination after completed treatment for malignant neoplasm: Secondary | ICD-10-CM | POA: Diagnosis present

## 2015-07-16 DIAGNOSIS — Z980 Intestinal bypass and anastomosis status: Secondary | ICD-10-CM | POA: Diagnosis not present

## 2015-07-16 DIAGNOSIS — R911 Solitary pulmonary nodule: Secondary | ICD-10-CM | POA: Insufficient documentation

## 2015-07-16 LAB — COMPREHENSIVE METABOLIC PANEL (CC13)
ALBUMIN: 3.9 g/dL (ref 3.5–5.0)
ALT: 14 U/L (ref 0–55)
AST: 27 U/L (ref 5–34)
Alkaline Phosphatase: 44 U/L (ref 40–150)
Anion Gap: 9 mEq/L (ref 3–11)
BILIRUBIN TOTAL: 0.65 mg/dL (ref 0.20–1.20)
BUN: 14.7 mg/dL (ref 7.0–26.0)
CALCIUM: 9.5 mg/dL (ref 8.4–10.4)
CO2: 26 mEq/L (ref 22–29)
CREATININE: 0.9 mg/dL (ref 0.6–1.1)
Chloride: 106 mEq/L (ref 98–109)
EGFR: 62 mL/min/{1.73_m2} — AB (ref >90)
GLUCOSE: 87 mg/dL (ref 70–140)
Potassium: 4.4 mEq/L (ref 3.5–5.1)
SODIUM: 141 meq/L (ref 136–145)
Total Protein: 7 g/dL (ref 6.4–8.3)

## 2015-07-16 LAB — CEA: CEA: 1.7 ng/mL (ref 0.0–5.0)

## 2015-07-16 MED ORDER — IOHEXOL 300 MG/ML  SOLN
100.0000 mL | Freq: Once | INTRAMUSCULAR | Status: AC | PRN
  Administered 2015-07-16: 100 mL via INTRAVENOUS

## 2015-07-20 ENCOUNTER — Ambulatory Visit (HOSPITAL_BASED_OUTPATIENT_CLINIC_OR_DEPARTMENT_OTHER): Payer: Medicare Other | Admitting: Oncology

## 2015-07-20 ENCOUNTER — Telehealth: Payer: Self-pay | Admitting: Oncology

## 2015-07-20 VITALS — BP 164/83 | HR 64 | Temp 97.7°F | Ht 61.0 in | Wt 139.6 lb

## 2015-07-20 DIAGNOSIS — Z8673 Personal history of transient ischemic attack (TIA), and cerebral infarction without residual deficits: Secondary | ICD-10-CM

## 2015-07-20 DIAGNOSIS — K769 Liver disease, unspecified: Secondary | ICD-10-CM

## 2015-07-20 DIAGNOSIS — C182 Malignant neoplasm of ascending colon: Secondary | ICD-10-CM

## 2015-07-20 DIAGNOSIS — Z86718 Personal history of other venous thrombosis and embolism: Secondary | ICD-10-CM | POA: Diagnosis not present

## 2015-07-20 DIAGNOSIS — Z8541 Personal history of malignant neoplasm of cervix uteri: Secondary | ICD-10-CM

## 2015-07-20 DIAGNOSIS — C180 Malignant neoplasm of cecum: Secondary | ICD-10-CM | POA: Diagnosis not present

## 2015-07-20 NOTE — Progress Notes (Signed)
  [facility] OFFICE PROGRESS NOTE   Diagnosis: Colon cancer  INTERVAL HISTORY:   Cynthia [name] returns as scheduled. She continues to have left-sided numbness and tingling following the CVA the summer. No other complaint.  Objective:  Vital signs in last 24 hours:  Blood pressure 164/83, pulse 64, temperature 97.7 F (36.5 C), temperature source Oral, height 5' 1" (1.549 m), weight 139 lb 9.6 oz (63.322 kg), SpO2 98 %.    HEENT: Neck without mass Lymphatics: No cervical, supraclavicular, axillary, or inguinal nodes Resp: Lungs clear bilaterally Cardio: Regular rate and rhythm GI: No hepatomegaly, no mass, nontender Vascular: No leg edema   Lab Results:  Lab Results  Component Value Date   WBC 6.4 01/11/2015   HGB 14.6 01/11/2015   HCT 36.9 01/11/2015   MCV 88.1 01/11/2015   PLT 206 01/11/2015   NEUTROABS 4.0 01/11/2015      Lab Results  Component Value Date   CEA 1.7 07/16/2015    Imaging:  CT chest, abdomen, and pelvis 07/16/2015-left lower lobe pulmonary nodule measuring 4 mm is unchanged. 2 new hypodense liver lesions measuring 1.7 cm and 1.2 cm in the right liver suspicious for metastases. Other liver cyst persist.  Medications: I have reviewed the patient's current medications.  Assessment/Plan: 1. Moderately differentiated adenocarcinoma of the cecum, stage III (T4a, N1), status post a laparoscopic assisted right colectomy 10/22/2013  The tumor returned microsatellite stable with normal mismatch repair protein expression   Cycle 1 adjuvant Xeloda 12/25/2013.   Cycle 2 adjuvant Xeloda 01/15/2014.   Cycle 3 adjuvant Xeloda 02/05/2014.   Cycle 4 adjuvant Xeloda 03/18/2014, discontinued after 4 days secondary to diarrhea   Cycle 5 adjuvant Xeloda 04/16/2014, Xeloda dose reduced to 1000 mg in the a.m. and 500 mg in the p.m.   Cycle 6 adjuvant Xeloda 05/07/2014.  Cycle 7 adjuvant Xeloda 05/28/2014.  Cycle 8 adjuvant Xeloda  06/18/2014.  Restaging CTs on 07/14/2014 with no evidence of metastatic colon cancer  Restaging CTs 07/16/2015 with 2 new hypodense liver lesions concerning for metastases, no other evidence of disease progression 2. Right ovary cystadenofibroma, status post a bilateral oophorectomy 10/22/2013 3. Remote history of cervical cancer. 4. Iron deficiency anemia-resolved. 5. Right calf deep vein thrombosis 10/24/2013-she completed 3 months of xarelto. 6. Indeterminate 12 mm hypermetabolic right pelvic density on the staging PET scan 10/04/2013-CT followup recommended per the GI tumor conference 11/13/2013. No longer visualized on a CT 07/14/2014. 7. Soft tissue implant overlying the posterior right liver on the CT 06/14/5944-OPF hypermetabolic on the PET scan 29/24/4628. 8. C. difficile colitis 03/05/2014. She completed a course of Flagyl, recurrent diarrhea beginning 03/21/2014, positive C. difficile toxin 03/26/2014-resumed Flagyl for planned 10 day course.  Vancomycin beginning 04/08/2014.  Taper complete 05/21/2014.  9.   CVA May 2016   Disposition:  [name] appears stable. The restaging CT reveals 2 liver lesions suspicious for metastases. I reviewed the CT images with [name] and her daughter. She will be referred for a staging PET scan and biopsy of a liver lesion. She will return for an office visit after these procedures.  If a diagnosis of metastatic colon cancer is confirmed we will decide on treatment which may include a referral to consider liver resection, radiofrequency ablation, or systemic therapy.  [name], MD  07/20/2015  1:30 PM

## 2015-07-20 NOTE — Telephone Encounter (Signed)
per pof to sch pt appt-adv [facility] al to sch trmt-pt aware

## 2015-07-23 ENCOUNTER — Other Ambulatory Visit: Payer: Self-pay | Admitting: Radiology

## 2015-07-28 ENCOUNTER — Ambulatory Visit (HOSPITAL_COMMUNITY)
Admission: RE | Admit: 2015-07-28 | Discharge: 2015-07-28 | Disposition: A | Discharge status: Home / Self Care | Payer: Medicare Other | Source: Ambulatory Visit | Attending: Oncology | Admitting: Oncology

## 2015-07-28 DIAGNOSIS — K769 Liver disease, unspecified: Secondary | ICD-10-CM | POA: Diagnosis present

## 2015-07-28 DIAGNOSIS — I10 Essential (primary) hypertension: Secondary | ICD-10-CM | POA: Diagnosis not present

## 2015-07-28 DIAGNOSIS — Z85038 Personal history of other malignant neoplasm of large intestine: Secondary | ICD-10-CM | POA: Diagnosis not present

## 2015-07-28 DIAGNOSIS — Z8541 Personal history of malignant neoplasm of cervix uteri: Secondary | ICD-10-CM | POA: Insufficient documentation

## 2015-07-28 DIAGNOSIS — Z833 Family history of diabetes mellitus: Secondary | ICD-10-CM | POA: Insufficient documentation

## 2015-07-28 DIAGNOSIS — Z79899 Other long term (current) drug therapy: Secondary | ICD-10-CM | POA: Insufficient documentation

## 2015-07-28 DIAGNOSIS — Z8249 Family history of ischemic heart disease and other diseases of the circulatory system: Secondary | ICD-10-CM | POA: Insufficient documentation

## 2015-07-28 DIAGNOSIS — Z9049 Acquired absence of other specified parts of digestive tract: Secondary | ICD-10-CM | POA: Insufficient documentation

## 2015-07-28 DIAGNOSIS — C182 Malignant neoplasm of ascending colon: Secondary | ICD-10-CM

## 2015-07-28 LAB — GLUCOSE, CAPILLARY: Glucose-Capillary: 85 mg/dL (ref 65–99)

## 2015-07-28 MED ORDER — FLUDEOXYGLUCOSE F - 18 (FDG) INJECTION
6.8700 | Freq: Once | INTRAVENOUS | Status: AC | PRN
  Administered 2015-07-28: 6.87 via INTRAVENOUS

## 2015-07-29 ENCOUNTER — Telehealth: Payer: Self-pay | Admitting: *Deleted

## 2015-07-29 ENCOUNTER — Other Ambulatory Visit: Payer: Self-pay | Admitting: Radiology

## 2015-07-29 NOTE — Telephone Encounter (Signed)
-----   Message from Ladell Pier, MD sent at 07/28/2015  5:42 PM EST ----- Please call patient, PET identified only 1 liver lesion suspicious for a metastasis. Proceed with biopsy as scheduled and f/u 12/22 as scheduled

## 2015-07-29 NOTE — Telephone Encounter (Signed)
Voicemail received from patient asking what information collaborative nurse left for her.  This nurse called repeating previous call information on patient identified voicemail.  Biopsy scheduled tomorrow.

## 2015-07-30 ENCOUNTER — Ambulatory Visit (HOSPITAL_COMMUNITY)
Admission: RE | Admit: 2015-07-30 | Discharge: 2015-07-30 | Disposition: A | Discharge status: Home / Self Care | Payer: Medicare Other | Source: Ambulatory Visit | Attending: Oncology | Admitting: Oncology

## 2015-07-30 ENCOUNTER — Encounter (HOSPITAL_COMMUNITY): Payer: Self-pay

## 2015-07-30 DIAGNOSIS — K769 Liver disease, unspecified: Secondary | ICD-10-CM | POA: Diagnosis not present

## 2015-07-30 DIAGNOSIS — C182 Malignant neoplasm of ascending colon: Secondary | ICD-10-CM

## 2015-07-30 LAB — APTT: aPTT: 22 seconds — ABNORMAL LOW (ref 24–37)

## 2015-07-30 LAB — CBC
HCT: 40.8 % (ref 36.0–46.0)
Hemoglobin: 13.4 g/dL (ref 12.0–15.0)
MCH: 29.8 pg (ref 26.0–34.0)
MCHC: 32.8 g/dL (ref 30.0–36.0)
MCV: 90.7 fL (ref 78.0–100.0)
PLATELETS: 217 10*3/uL (ref 150–400)
RBC: 4.5 MIL/uL (ref 3.87–5.11)
RDW: 13.9 % (ref 11.5–15.5)
WBC: 5.2 10*3/uL (ref 4.0–10.5)

## 2015-07-30 LAB — PROTIME-INR
INR: 1.07 (ref 0.00–1.49)
PROTHROMBIN TIME: 14.1 s (ref 11.6–15.2)

## 2015-07-30 MED ORDER — SODIUM CHLORIDE 0.9 % IV SOLN: Freq: Once | INTRAVENOUS | Status: DC

## 2015-07-30 MED ORDER — FENTANYL CITRATE (PF) 100 MCG/2ML IJ SOLN
INTRAMUSCULAR | Status: AC
  Filled 2015-07-30: qty 2

## 2015-07-30 MED ORDER — GELATIN ABSORBABLE 12-7 MM EX MISC
CUTANEOUS | Status: AC
  Filled 2015-07-30: qty 1

## 2015-07-30 MED ORDER — FENTANYL CITRATE (PF) 100 MCG/2ML IJ SOLN
INTRAMUSCULAR | Status: AC | PRN
  Administered 2015-07-30: 50 ug via INTRAVENOUS

## 2015-07-30 MED ORDER — LIDOCAINE HCL (PF) 1 % IJ SOLN
INTRAMUSCULAR | Status: AC
  Filled 2015-07-30: qty 10

## 2015-07-30 MED ORDER — MIDAZOLAM HCL 2 MG/2ML IJ SOLN
INTRAMUSCULAR | Status: AC | PRN
  Administered 2015-07-30: 1 mg via INTRAVENOUS

## 2015-07-30 MED ORDER — SODIUM CHLORIDE 0.9 % IV SOLN
INTRAVENOUS | Status: AC | PRN
  Administered 2015-07-30: 10 mL/h via INTRAVENOUS

## 2015-07-30 MED ORDER — MIDAZOLAM HCL 2 MG/2ML IJ SOLN
INTRAMUSCULAR | Status: AC
  Filled 2015-07-30: qty 4

## 2015-07-30 NOTE — H&P (Signed)
Chief Complaint: Patient was seen in consultation today for liver lesion bx at the request of Sherrill,[name]  Referring Physician(s): Sherrill,[name]  History of Present Illness: Cynthia [name] is a 79 y.o. female   Hx colon Ca---colectomy 10/2013 Hx cervical Ca New liver lesion noted on follow up/staging studies  IMPRESSION: 2 cm hypermetabolic liver lesion in the inferior right hepatic lobe, consistent with liver metastasis.  No other sites of recurrent or metastatic carcinoma identified.  Now scheduled for liver lesion biopsy per Dr Benay Spice  Past Medical History  Diagnosis Date  . Cancer ([place])     cervical ca  . Anemia   . Blood transfusion without reported diagnosis jan 2015  . Colon cancer ([place])   . Heart murmur   . Hypertension     no bp meds   . Liver cyst   . Complication of anesthesia     slow to awaken in past  . Colon cancer ([place])   . Cervical cancer [facility])     Past Surgical History  Procedure Laterality Date  . Abdominal hysterectomy      vaginal- partial  . Back surgery  1981    lower lumbar  . Tubal ligation  1968  . Laparoscopic right colectomy Right 10/22/2013    Procedure: LAPAROSCOPIC RIGHT COLECTOMY ;  Surgeon: [name], MD;  Location: [facility];  Service: General;  Laterality: Right;  . Salpingoophorectomy  10/22/2013    Procedure: [place] Saa;  Surgeon: [name]. [name], MD;  Location: [facility];  Service: Gynecology;;    Allergies: Review of patient's allergies indicates no known allergies.  Medications: Prior to Admission medications   Medication Sig Start Date End Date Taking? Authorizing Provider  aspirin EC 81 MG tablet Take 1 tablet (81 mg total) by mouth daily. 01/13/15  Yes Ashly M Gottschalk, DO  atorvastatin (LIPITOR) 40 MG tablet Take 1 tablet (40 mg total) by mouth daily. 01/13/15  Yes Ashly M Gottschalk, DO  metoprolol succinate (TOPROL-XL) 25 MG 24 hr tablet Take 25 mg by mouth every evening.  12/20/14  Yes Historical Provider, MD  Multiple Vitamin (MULTIVITAMIN WITH MINERALS) TABS tablet Take 1 tablet by mouth daily.   Yes Historical Provider, MD     Family History  Problem Relation Age of Onset  . Diabetes Sister   . Cancer Sister     leukemia  . Diabetes Brother   . Heart failure Brother   . Hypertension Brother   . Breast cancer Maternal Aunt   . Rheum arthritis Mother   . Heart failure Father   . Cancer Maternal Grandmother     GIST  . Heart failure Maternal Grandfather   . Aneurysm Paternal Grandmother     Social History   Social History  . Marital Status: Widowed    Spouse Name: N/A  . Number of Children: N/A  . Years of Education: N/A   Social History Main Topics  . Smoking status: Never Smoker   . Smokeless tobacco: Never Used  . Alcohol Use: No  . Drug Use: No  . Sexual Activity: No   Other Topics Concern  . None   Social History Narrative    Review of Systems: A 12 point ROS discussed and pertinent positives are indicated in the HPI above.  All other systems are negative.  Review of Systems  Constitutional: Negative for fever, activity change, appetite change and fatigue.  Cardiovascular: Negative for chest pain.  Gastrointestinal: Positive for abdominal pain.  Musculoskeletal: Negative for back pain.  Neurological: Positive for weakness.  Psychiatric/Behavioral: Negative for behavioral problems and confusion.    Vital Signs: BP 182/83 mmHg  Pulse 72  Temp(Src) 97.6 F (36.4 C)  Physical Exam  Constitutional: She is oriented to person, place, and time.  Cardiovascular: Normal rate, regular rhythm and normal heart sounds.   Pulmonary/Chest: Effort normal.  Abdominal: Soft. Bowel sounds are normal.  Musculoskeletal: Normal range of motion.  Neurological: She is alert and oriented to person, place, and time.  Skin: Skin is warm and dry.  Psychiatric: She has a normal mood and affect. Her behavior is normal. Judgment and thought  content normal.  Nursing note and vitals reviewed.   Mallampati Score:  MD Evaluation Airway: WNL Heart: WNL Abdomen: WNL Chest/ Lungs: WNL ASA  Classification: 3 Mallampati/Airway Score: One  Imaging: Ct Chest W Contrast  07/16/2015  CLINICAL DATA:  Stage III colon cancer status post right colectomy on 10/22/2013 with ongoing chemotherapy, presenting for restaging. Remote history of cervical cancer and hysterectomy. EXAM: CT CHEST, ABDOMEN, AND PELVIS WITH CONTRAST TECHNIQUE: Multidetector CT imaging of the chest, abdomen and pelvis was performed following the standard protocol during bolus administration of intravenous contrast. CONTRAST:  177mL OMNIPAQUE IOHEXOL 300 MG/ML  SOLN COMPARISON:  07/14/2014 CT chest, abdomen and pelvis. FINDINGS: CT CHEST FINDINGS Mediastinum/Nodes: Normal heart size. No pericardial fluid/thickening. Left anterior descending coronary atherosclerosis. Great vessels are normal in course and caliber. No central pulmonary emboli. Normal visualized thyroid. Normal esophagus. No pathologically enlarged axillary, mediastinal or hilar lymph nodes. Lungs/Pleura: No pneumothorax. No pleural effusion. Solid anterior left lower lobe 4 mm pulmonary nodule (series 5/image 20) is stable since 09/04/2013. No acute consolidative airspace disease, new significant pulmonary nodules or lung masses. Stable minimal scarring at both lung bases. Musculoskeletal: No aggressive appearing focal osseous lesions. Minimal degenerative changes in the thoracic spine. CT ABDOMEN PELVIS FINDINGS Hepatobiliary: There are two new hypodense liver masses measuring 1.7 cm and segment 6 of the right liver lobe (series 2/ image 66) and 1.2 cm in segment 7 of the right liver lobe (series 2/ image 47). There is a 0.6 cm hypodense lesion in segment 2 of the left liver lobe (series 2/ image 54), which is decreased in size from 2.2 cm, in keeping with a decreasing liver cyst. There are 5 additional simple liver  cysts that are unchanged, largest 5.3 cm in segment 3 of the left liver lobe. There is a hypodense 1.4 cm ovoid structure posterior to the right liver lobe (series 2/ image 60), which is stable back to 09/20/2013. Normal gallbladder with no radiopaque cholelithiasis. No biliary ductal dilatation. Pancreas: Normal, with no mass or duct dilation. Spleen: Normal size. No mass. Adrenals/Urinary Tract: Normal adrenals. Normal kidneys with no hydronephrosis and no renal mass. Normal bladder. Stomach/Bowel: Grossly normal stomach. Stable postsurgical changes status post subtotal right hemicolectomy, with no evidence of anastomotic tumor recurrence at the ileocolic anastomosis. Normal caliber small bowel with no small bowel wall thickening. Mild to moderate sigmoid diverticulosis, with no wall thickening or pericolonic fat stranding in the residual colon. Vascular/Lymphatic: Atherosclerotic nonaneurysmal abdominal aorta. Patent portal, splenic, hepatic and renal veins. No pathologically enlarged lymph nodes in the abdomen or pelvis. Reproductive: Status post hysterectomy, with no abnormal findings at the vaginal cuff. No adnexal mass. Other: No pneumoperitoneum, ascites or focal fluid collection. Musculoskeletal: No aggressive appearing focal osseous lesions. Moderate degenerative changes in the lumbar spine. IMPRESSION: 1. Two new hypodense liver masses, suspicious for  liver metastases. 2. Otherwise no new sites of metastatic disease in the chest, abdomen or pelvis. No evidence of local tumor recurrence at the ileocolic anastomosis. 3. Stable solitary 4 mm left lower lobe pulmonary nodule, for which 22 months stability has been demonstrated, probably benign. 4. Stable small hypodense ovoid structure posterior to the right liver lobe, for which 21 month stability has been demonstrated, probably benign. Electronically Signed   By: Ilona Sorrel M.D.   On: 07/16/2015 10:21   Ct Abdomen Pelvis W Contrast  07/16/2015   CLINICAL DATA:  Stage III colon cancer status post right colectomy on 10/22/2013 with ongoing chemotherapy, presenting for restaging. Remote history of cervical cancer and hysterectomy. EXAM: CT CHEST, ABDOMEN, AND PELVIS WITH CONTRAST TECHNIQUE: Multidetector CT imaging of the chest, abdomen and pelvis was performed following the standard protocol during bolus administration of intravenous contrast. CONTRAST:  14mL OMNIPAQUE IOHEXOL 300 MG/ML  SOLN COMPARISON:  07/14/2014 CT chest, abdomen and pelvis. FINDINGS: CT CHEST FINDINGS Mediastinum/Nodes: Normal heart size. No pericardial fluid/thickening. Left anterior descending coronary atherosclerosis. Great vessels are normal in course and caliber. No central pulmonary emboli. Normal visualized thyroid. Normal esophagus. No pathologically enlarged axillary, mediastinal or hilar lymph nodes. Lungs/Pleura: No pneumothorax. No pleural effusion. Solid anterior left lower lobe 4 mm pulmonary nodule (series 5/image 20) is stable since 09/04/2013. No acute consolidative airspace disease, new significant pulmonary nodules or lung masses. Stable minimal scarring at both lung bases. Musculoskeletal: No aggressive appearing focal osseous lesions. Minimal degenerative changes in the thoracic spine. CT ABDOMEN PELVIS FINDINGS Hepatobiliary: There are two new hypodense liver masses measuring 1.7 cm and segment 6 of the right liver lobe (series 2/ image 66) and 1.2 cm in segment 7 of the right liver lobe (series 2/ image 47). There is a 0.6 cm hypodense lesion in segment 2 of the left liver lobe (series 2/ image 54), which is decreased in size from 2.2 cm, in keeping with a decreasing liver cyst. There are 5 additional simple liver cysts that are unchanged, largest 5.3 cm in segment 3 of the left liver lobe. There is a hypodense 1.4 cm ovoid structure posterior to the right liver lobe (series 2/ image 60), which is stable back to 09/20/2013. Normal gallbladder with no radiopaque  cholelithiasis. No biliary ductal dilatation. Pancreas: Normal, with no mass or duct dilation. Spleen: Normal size. No mass. Adrenals/Urinary Tract: Normal adrenals. Normal kidneys with no hydronephrosis and no renal mass. Normal bladder. Stomach/Bowel: Grossly normal stomach. Stable postsurgical changes status post subtotal right hemicolectomy, with no evidence of anastomotic tumor recurrence at the ileocolic anastomosis. Normal caliber small bowel with no small bowel wall thickening. Mild to moderate sigmoid diverticulosis, with no wall thickening or pericolonic fat stranding in the residual colon. Vascular/Lymphatic: Atherosclerotic nonaneurysmal abdominal aorta. Patent portal, splenic, hepatic and renal veins. No pathologically enlarged lymph nodes in the abdomen or pelvis. Reproductive: Status post hysterectomy, with no abnormal findings at the vaginal cuff. No adnexal mass. Other: No pneumoperitoneum, ascites or focal fluid collection. Musculoskeletal: No aggressive appearing focal osseous lesions. Moderate degenerative changes in the lumbar spine. IMPRESSION: 1. Two new hypodense liver masses, suspicious for liver metastases. 2. Otherwise no new sites of metastatic disease in the chest, abdomen or pelvis. No evidence of local tumor recurrence at the ileocolic anastomosis. 3. Stable solitary 4 mm left lower lobe pulmonary nodule, for which 22 months stability has been demonstrated, probably benign. 4. Stable small hypodense ovoid structure posterior to the right liver lobe, for  which 21 month stability has been demonstrated, probably benign. Electronically Signed   By: Ilona Sorrel M.D.   On: 07/16/2015 10:21   Nm Pet Image Restag (ps) Skull Base To Thigh  07/28/2015  CLINICAL DATA:  Subsequent treatment strategy for right colonic carcinoma. New liver lesions seen on recent CT. EXAM: NUCLEAR MEDICINE PET SKULL BASE TO THIGH TECHNIQUE: 6.9 mCi F-18 FDG was injected intravenously. Full-ring PET imaging was  performed from the skull base to thigh after the radiotracer. CT data was obtained and used for attenuation correction and anatomic localization. FASTING BLOOD GLUCOSE:  Value: 85 mg/dl COMPARISON:  CT on 07/16/2015 and PET-CT on 10/04/2013 FINDINGS: NECK No hypermetabolic lymph nodes in the neck. CHEST No hypermetabolic mediastinal or hilar nodes. 4 mm left midlung pulmonary nodule on image 31/series 8 remains stable and shows no associated metabolic activity, likely an intrapulmonary lymph node. ABDOMEN/PELVIS A few benign left hepatic lobe cysts are again noted. At least 1 new small hypermetabolic lesion is seen in the inferior right hepatic lobe which measures approximately 1.7 cm on image 116/series 4, and is hypermetabolic, with SUV max of 6.5. This is consistent with a liver metastasis. No other definite hypermetabolic liver lesions visualized. No abnormal hypermetabolic activity within the pancreas, adrenal glands, or spleen. No hypermetabolic lymph nodes in the abdomen or pelvis. Sigmoid diverticulosis again demonstrated, without evidence of diverticulitis. SKELETON No focal hypermetabolic activity to suggest skeletal metastasis. IMPRESSION: 2 cm hypermetabolic liver lesion in the inferior right hepatic lobe, consistent with liver metastasis. No other sites of recurrent or metastatic carcinoma identified. Electronically Signed   By: Earle Gell M.D.   On: 07/28/2015 16:42    Labs:  CBC:  Recent Labs  01/11/15 1200 01/11/15 1207 01/11/15 1920  WBC 6.4  --   --   HGB 13.4 14.6  --   HCT 40.6 43.0 36.9  PLT 206  --   --     COAGS:  Recent Labs  01/11/15 1200  INR 1.06  APTT 27    BMP:  Recent Labs  01/11/15 1200 01/11/15 1207 07/16/15 0755  NA 139 140 141  K 4.1 4.0 4.4  CL 103 104  --   CO2 25  --  26  GLUCOSE 93 95 87  BUN 13 14 14.7  CALCIUM 9.2  --  9.5  CREATININE 0.74 0.80 0.9  GFRNONAA >60  --   --   GFRAA >60  --   --     LIVER FUNCTION TESTS:  Recent  Labs  01/11/15 1200 07/16/15 0755  BILITOT 0.5 0.65  AST 24 27  ALT 13* 14  ALKPHOS 42 44  PROT 7.4 7.0  ALBUMIN 4.0 3.9    TUMOR MARKERS:  Recent Labs  10/16/14 1020 04/16/15 1027 07/16/15 0755  CEA 2.3 2.6 1.7    Assessment and Plan:  Hx Cervical Ca Hx Colon Ca New liver lesions +PET Scheduled now for biopsy of same Risks and Benefits discussed with the patient including, but not limited to bleeding, infection, damage to adjacent structures or low yield requiring additional tests. All of the patient's questions were answered, patient is agreeable to proceed. Consent signed and in chart.   Thank you for this interesting consult.  I greatly enjoyed meeting Cynthia [name] and look forward to participating in their care.  A copy of this report was sent to the requesting provider on this date.  Signed: Clelia Trabucco A 07/30/2015, 12:42 PM   I spent a total  of  30 Minutes   in face to face in clinical consultation, greater than 50% of which was counseling/coordinating care for liver lesion bx

## 2015-07-30 NOTE — Discharge Instructions (Signed)

## 2015-07-30 NOTE — Procedures (Signed)
[place] core bx R liver lesion 18g x2 to surg path No complication No blood loss. See complete dictation in [facility].

## 2015-08-06 ENCOUNTER — Ambulatory Visit (HOSPITAL_BASED_OUTPATIENT_CLINIC_OR_DEPARTMENT_OTHER): Payer: Medicare Other | Admitting: Oncology

## 2015-08-06 ENCOUNTER — Telehealth: Payer: Self-pay | Admitting: Oncology

## 2015-08-06 ENCOUNTER — Other Ambulatory Visit: Payer: Self-pay | Admitting: *Deleted

## 2015-08-06 VITALS — BP 149/95 | HR 73 | Temp 98.4°F | Resp 17 | Ht 61.0 in | Wt 140.5 lb

## 2015-08-06 DIAGNOSIS — C189 Malignant neoplasm of colon, unspecified: Secondary | ICD-10-CM

## 2015-08-06 DIAGNOSIS — C787 Secondary malignant neoplasm of liver and intrahepatic bile duct: Secondary | ICD-10-CM | POA: Diagnosis not present

## 2015-08-06 DIAGNOSIS — C180 Malignant neoplasm of cecum: Secondary | ICD-10-CM | POA: Diagnosis not present

## 2015-08-06 MED ORDER — CEPHALEXIN 500 MG PO CAPS: 500.0000 mg | ORAL_CAPSULE | Freq: Three times a day (TID) | ORAL | Status: DC

## 2015-08-06 NOTE — Telephone Encounter (Signed)
Gave patient avs report and appointments for February and Dr. Lisbeth Renshaw 08/26/2015. Left message for [facility] re order for eval/tx ablation of liver lesions - IR will call patient - patient aware.

## 2015-08-06 NOTE — Telephone Encounter (Signed)
Received return call from [facility] in [facility] per [facility] is the facility who does the consult eval/tx for the liver ablation and the referral has been sent to them. I left a follow up message for Vicky at [facility] [phone] re order and [facility] will contact patient re appointment. Also left message for patient and informed her to expect a call from [facility] re liver ablation and to call me back if she has any questions.

## 2015-08-06 NOTE — Progress Notes (Signed)
[facility] OFFICE PROGRESS NOTE   Diagnosis:  Colon cancer  INTERVAL HISTORY:    She returns as scheduled. Shin when a PET scan on 07/28/2015. 1 small  Hypermetabolic lesion was noted in the right liver. No other hypermetabolic liver lesions were seen. No other site of metastatic disease.  She was taken to an ultrasound-guided biopsy of the hypermobile right liver lesion by Dr. Vernard Gambles on 07/30/2015. The pathology [phone] ) confirmed adenocarcinoma consistent with a primary colorectal tumor. The tumor cells returned positive for CD X-2 and cytokeratin 20.    she reports erythema and tenderness overlying a right great toe bunion for the past week. This has improved with Neosporin ointment.  Objective:  Vital signs in last 24 hours:  Blood pressure 149/95, pulse 73, temperature 98.4 F (36.9 C), temperature source Oral, resp. rate 17, height _0  (1.549 m), weight 140 lb 8 oz (63.73 kg), SpO2 97 %.    HEENT:  Neck without mass Lymphatics:  No cervical or supra-clavicular nodes Resp:  Lungs clear bilaterally Cardio:  Regular rate and rhythm, 2/6 systolic murmur GI:  No hepatomegaly, no mass Vascular:  No leg edema  Skin: there is a bunion at the first MTP joint with overlying erythema/superficial ulceration an fluctuance. I could not express discharge.   Portacath/PICC-without erythema  Lab Results:  Lab Results  Component Value Date   WBC 5.2 07/30/2015   HGB 13.4 07/30/2015   HCT 40.8 07/30/2015   MCV 90.7 07/30/2015   PLT 217 07/30/2015   NEUTROABS 4.0 01/11/2015      Lab Results  Component Value Date   CEA 1.7 07/16/2015    Imaging:  PET scan images from  07/28/2015-reviewed Medications: I have reviewed the patient's current medications.  Assessment/Plan: 1. Moderately differentiated adenocarcinoma of the cecum, stage III (T4a, N1), status post a laparoscopic assisted right colectomy 10/22/2013  The tumor returned microsatellite  stable with normal mismatch repair protein expression   Cycle 1 adjuvant Xeloda 12/25/2013.   Cycle 2 adjuvant Xeloda 01/15/2014.   Cycle 3 adjuvant Xeloda 02/05/2014.   Cycle 4 adjuvant Xeloda 03/18/2014, discontinued after 4 days secondary to diarrhea   Cycle 5 adjuvant Xeloda 04/16/2014, Xeloda dose reduced to 1000 mg in the a.m. and 500 mg in the p.m.   Cycle 6 adjuvant Xeloda 05/07/2014.  Cycle 7 adjuvant Xeloda 05/28/2014.  Cycle 8 adjuvant Xeloda 06/18/2014.  Restaging CTs on 07/14/2014 with no evidence of metastatic colon cancer  Restaging CTs 07/16/2015 with 2 new hypodense liver lesions concerning for metastases, no other evidence of disease progression   PET scan 07/28/2015 with a single hypermetabolic right liver lesion, no other evidence of metastatic disease   Ultrasound-guided biopsy of the right liver lesion 07/30/2015 confirmed metastatic colon cancer 2. Right ovary cystadenofibroma, status post a bilateral oophorectomy 10/22/2013 3. Remote history of cervical cancer. 4. Iron deficiency anemia-resolved. 5. Right calf deep vein thrombosis 10/24/2013-she completed 3 months of xarelto. 6. Indeterminate 12 mm hypermetabolic right pelvic density on the staging PET scan 10/04/2013-CT followup recommended per the GI tumor conference 11/13/2013. No longer visualized on a CT 07/14/2014. 7. Soft tissue implant overlying the posterior right liver on the CT 66/44/0347-QQV hypermetabolic on the PET scan 95/63/8756. 8. C. difficile colitis 03/05/2014. She completed a course of Flagyl, recurrent diarrhea beginning 03/21/2014, positive C. difficile toxin 03/26/2014-resumed Flagyl for planned 10 day course.  Vancomycin beginning 04/08/2014.  Taper complete 05/21/2014.  9. CVA May 2016 10.  Skin infection at the  right first MTP joint- Keflex prescribed 08/06/2015    Disposition:   Ms.Shams has been diagnosed with metastatic colon cancer involving a right liver  lesion. There is no additional clinical or x-ray evidence of distant metastatic disease aside from the possibility of another right liver lesion. Her case was presented at the GI tumor conference 08/05/2015. It may be difficult to ablate the superior liver lesion. She will be referred to consider ablation of the dominant liver lesion. I will refer her to Dr. Lisbeth Renshaw to consider SRS to the other liver lesion if this grows on a follow-up CT.    Ms. Wiersma at her daughter are in agreement to this plan. They understand the small chance that an ablation procedure will be curative. We discussed the possibility of a hepatectomy procedure and she would rather be considered for ablation.   Ms. Heiden will return for an office visit in approximately 6 weeks.  [name], MD  08/06/2015  2:46 PM

## 2015-08-18 ENCOUNTER — Ambulatory Visit
Admission: RE | Admit: 2015-08-18 | Discharge: 2015-08-18 | Disposition: A | Discharge status: Home / Self Care | Payer: Medicare Other | Source: Ambulatory Visit | Attending: Oncology | Admitting: Oncology

## 2015-08-18 DIAGNOSIS — C189 Malignant neoplasm of colon, unspecified: Secondary | ICD-10-CM | POA: Insufficient documentation

## 2015-08-18 DIAGNOSIS — C787 Secondary malignant neoplasm of liver and intrahepatic bile duct: Principal | ICD-10-CM

## 2015-08-18 NOTE — Consult Note (Signed)
Chief Complaint: Patient was seen in consultation today at the request of Sherrill,[name] Metastatic colon adenocarcinoma to the liver.   Referring Physician(s): Sherrill,[name]  History of Present Illness: Cynthia [name] is a 80 y.o. female with original diagnosis of invasive adenocarcinoma of the cecum in February, 2015. She underwent right hemicolectomy and lymph node dissection on 10/22/2013 demonstrating a 7 cm maximum diameter cecal adenocarcinoma with moderate differentiation. One of 15 resected lymph nodes were positive for metastatic carcinoma.  She underwent 8 cycles of adjuvant Xeloda from May, 2015 until November, 2015.  CT on 07/16/2015 demonstrated 2 new liver lesions with a roughly 1.7 cm inferior right lobe lesion in segment 6 and a 1.2 cm lesion in the superior right lobe in segment 7. PET scan on 07/28/2015 demonstrated increased metabolic activity in the inferior 1.7 cm right lobe liver lesion with SUV max of 6.5. Increased metabolic activity was not identified in the area of possible superior right lobe lesion. CEA has not been elevated with the latest CEA 1.7 on 07/16/2015.  Ultrasound guided biopsy was performed of the inferior and larger right lobe liver lesion on 07/30/2015 revealing evidence of adenocarcinoma with immunohistochemistry consistent with metastatic colorectal adenocarcinoma.  Past Medical History  Diagnosis Date  . Cancer ([place])     cervical ca  . Anemia   . Blood transfusion without reported diagnosis jan 2015  . Colon cancer ([place])   . Heart murmur   . Hypertension     no bp meds   . Liver cyst   . Complication of anesthesia     slow to awaken in past  . Colon cancer ([place])   . Cervical cancer [facility])     Past Surgical History  Procedure Laterality Date  . Abdominal hysterectomy      vaginal- partial  . Back surgery  1981    lower lumbar  . Tubal ligation  1968  . Laparoscopic right colectomy Right 10/22/2013    Procedure:  LAPAROSCOPIC RIGHT COLECTOMY ;  Surgeon: [name], MD;  Location: [facility];  Service: General;  Laterality: Right;  . Salpingoophorectomy  10/22/2013    Procedure: [place] Saa;  Surgeon: [name]. [name], MD;  Location: [facility];  Service: Gynecology;;    Allergies: Review of patient's allergies indicates no known allergies.  Medications: Prior to Admission medications   Medication Sig Start Date End Date Taking? Authorizing Provider  aspirin EC 81 MG tablet Take 1 tablet (81 mg total) by mouth daily. 01/13/15  Yes Ashly M Gottschalk, DO  atorvastatin (LIPITOR) 40 MG tablet Take 1 tablet (40 mg total) by mouth daily. 01/13/15  Yes Ashly M Gottschalk, DO  metoprolol succinate (TOPROL-XL) 25 MG 24 hr tablet Take 25 mg by mouth every evening. 12/20/14  Yes Historical Provider, MD  Multiple Vitamin (MULTIVITAMIN WITH MINERALS) TABS tablet Take 1 tablet by mouth daily.   Yes Historical Provider, MD  cephALEXin (KEFLEX) 500 MG capsule Take 1 capsule (500 mg total) by mouth 3 (three) times daily. For 7 days Patient not taking: Reported on 08/18/2015 08/06/15   Ladell Pier, MD     Family History  Problem Relation Age of Onset  . Diabetes Sister   . Cancer Sister     leukemia  . Diabetes Brother   . Heart failure Brother   . Hypertension Brother   . Breast cancer Maternal Aunt   . Rheum arthritis Mother   . Heart failure Father   .  Cancer Maternal Grandmother     GIST  . Heart failure Maternal Grandfather   . Aneurysm Paternal Grandmother     Social History   Social History  . Marital Status: Widowed    Spouse Name: N/A  . Number of Children: N/A  . Years of Education: N/A   Social History Main Topics  . Smoking status: Never Smoker   . Smokeless tobacco: Never Used  . Alcohol Use: No  . Drug Use: No  . Sexual Activity: No   Other Topics Concern  . Not on file   Social History Narrative    ECOG Status: 0 - Asymptomatic  Review of Systems: A 12  point ROS discussed and pertinent positives are indicated in the HPI above.  All other systems are negative.  Review of Systems  Constitutional: Negative.   Respiratory: Negative.   Cardiovascular: Negative.   Gastrointestinal: Negative.   Genitourinary: Negative.   Musculoskeletal: Negative.   Neurological: Negative.     Vital Signs: BP 158/82 mmHg  Pulse 77  Temp(Src) 98.4 F (36.9 C) (Oral)  Resp 13  Ht 5' 2" (1.575 m)  Wt 139 lb (63.05 kg)  BMI 25.42 kg/m2  SpO2 97%  Physical Exam  Constitutional: She is oriented to person, place, and time. She appears well-developed. No distress.  HENT:  Head: Normocephalic and atraumatic.  Neck: Neck supple. No JVD present.  Cardiovascular: Normal rate, regular rhythm and normal heart sounds.  Exam reveals no gallop and no friction rub.   No murmur heard. Pulmonary/Chest: Effort normal and breath sounds normal. No respiratory distress. She has no wheezes. She has no rales.  Abdominal: Soft. Bowel sounds are normal. She exhibits no distension and no mass. There is no tenderness. There is no rebound and no guarding.  Musculoskeletal: She exhibits no edema.  Neurological: She is alert and oriented to person, place, and time.  Skin: She is not diaphoretic.  Nursing note and vitals reviewed.   Mallampati Score:     Imaging: Nm Pet Image Restag (ps) Skull Base To Thigh  07/28/2015  CLINICAL DATA:  Subsequent treatment strategy for right colonic carcinoma. New liver lesions seen on recent CT. EXAM: NUCLEAR MEDICINE PET SKULL BASE TO THIGH TECHNIQUE: 6.9 mCi F-18 FDG was injected intravenously. Full-ring PET imaging was performed from the skull base to thigh after the radiotracer. CT data was obtained and used for attenuation correction and anatomic localization. FASTING BLOOD GLUCOSE:  Value: 85 mg/dl COMPARISON:  CT on 07/16/2015 and PET-CT on 10/04/2013 FINDINGS: NECK No hypermetabolic lymph nodes in the neck. CHEST No hypermetabolic  mediastinal or hilar nodes. 4 mm left midlung pulmonary nodule on image 31/series 8 remains stable and shows no associated metabolic activity, likely an intrapulmonary lymph node. ABDOMEN/PELVIS A few benign left hepatic lobe cysts are again noted. At least 1 new small hypermetabolic lesion is seen in the inferior right hepatic lobe which measures approximately 1.7 cm on image 116/series 4, and is hypermetabolic, with SUV max of 6.5. This is consistent with a liver metastasis. No other definite hypermetabolic liver lesions visualized. No abnormal hypermetabolic activity within the pancreas, adrenal glands, or spleen. No hypermetabolic lymph nodes in the abdomen or pelvis. Sigmoid diverticulosis again demonstrated, without evidence of diverticulitis. SKELETON No focal hypermetabolic activity to suggest skeletal metastasis. IMPRESSION: 2 cm hypermetabolic liver lesion in the inferior right hepatic lobe, consistent with liver metastasis. No other sites of recurrent or metastatic carcinoma identified. Electronically Signed   By: Sharrie Rothman.D.  On: 07/28/2015 16:42   US Biopsy  07/30/2015  CLINICAL DATA:  History of colon carcinoma. Hypermetabolic right lobe liver lesion. EXAM: ULTRASOUND-GUIDED CORE LIVER BIOPSY TECHNIQUE: An ultrasound guided liver biopsy was thoroughly discussed with the patient and questions were answered. The benefits, risks, alternatives, and complications were also discussed. The patient understands and wishes to proceed with the procedure. A verbal as well as written consent was obtained. Survey ultrasound of the liver was performed, the right lobe lesion localized, and an appropriate skin entry site was determined. Skin site was marked, prepped with Betadine, and draped in usual sterile fashion, and infiltrated locally with 1% lidocaine. Intravenous Fentanyl and Versed were administered as conscious sedation during continuous cardiorespiratory monitoring by the radiology RN, with a total  moderate sedation time of less than 30 minutes. A 17 gauge trocar needle was advanced under ultrasound guidance to the margin of the lesion. 2 coaxial 18gauge core samples were then obtained through the guide needle. The guide needle was removed. Post procedure scans demonstrate no apparent complication. COMPLICATIONS: COMPLICATIONS None immediate FINDINGS: 12 mm subcapsular lesion at identified in the right lobe near the tip of the liver corresponding to lesion on PET-CT. Core biopsy samples obtained as above. IMPRESSION: 1. Technically successful ultrasound guided core liver lesion biopsy. Electronically Signed   By: Lucrezia Europe M.D.   On: 07/30/2015 14:46    Labs:  CBC:  Recent Labs  01/11/15 1200 01/11/15 1207 01/11/15 1920 07/30/15 1230  WBC 6.4  --   --  5.2  HGB 13.4 14.6  --  13.4  HCT 40.6 43.0 36.9 40.8  PLT 206  --   --  217    COAGS:  Recent Labs  01/11/15 1200 07/30/15 1230  INR 1.06 1.07  APTT 27 22*    BMP:  Recent Labs  01/11/15 1200 01/11/15 1207 07/16/15 0755  NA 139 140 141  K 4.1 4.0 4.4  CL 103 104  --   CO2 25  --  26  GLUCOSE 93 95 87  BUN 13 14 14.7  CALCIUM 9.2  --  9.5  CREATININE 0.74 0.80 0.9  GFRNONAA >60  --   --   GFRAA >60  --   --     LIVER FUNCTION TESTS:  Recent Labs  01/11/15 1200 07/16/15 0755  BILITOT 0.5 0.65  AST 24 27  ALT 13* 14  ALKPHOS 42 44  PROT 7.4 7.0  ALBUMIN 4.0 3.9    TUMOR MARKERS:  Recent Labs  10/16/14 1020 04/16/15 1027 07/16/15 0755  CEA 2.3 2.6 1.7    Assessment and Plan:  I met with Cynthia [name] and her daughter. We reviewed the recent imaging findings. The inferior right lobe lesion is small enough and in a good location to treat with percutaneous thermal ablation. When reviewing the recent CT study, the superior right lobe lesion probably measures around 10 mm in diameter. This may be on the border of what may be accurately detectable by PET scan in the liver. This lesion would be  more difficult technically to treat with ablation but is amenable to thermal ablation as well, if it shows further evidence of growth to suggest metastatic disease. I did recommend to Cynthia [name] that we obtain an MRI of the abdomen to fully evaluate the liver which may be more convincing of metastatic disease in the smaller lesion and also evaluate for any other subtle lesions that might have been undetected by CT.  CT-guided thermal ablation  of metastatic disease in the liver was discussed including risks and benefits. The procedure involves general anesthesia and overnight hospital stay and is performed at [facility]. The patient would like to definitely proceed with scheduling a thermal ablation procedure of the larger biopsy-proven liver metastasis. We will begin the scheduling process. In the meantime, we will also schedule an outpatient MRI of the abdomen with gadolinium to further evaluate the smaller liver lesion.  Thank you for this interesting consult.  I greatly enjoyed meeting Cynthia [name] and look forward to participating in their care.  A copy of this report was sent to the requesting provider on this date.  SignedAletta Edouard T 08/18/2015, 3:39 PM   I spent a total of 40 Minutes in face to face in clinical consultation, greater than 50% of which was counseling/coordinating care for treatment of metastatic colon adenocarcinoma to the liver.

## 2015-08-19 ENCOUNTER — Other Ambulatory Visit (HOSPITAL_COMMUNITY): Payer: Self-pay | Admitting: Interventional Radiology

## 2015-08-19 DIAGNOSIS — C787 Secondary malignant neoplasm of liver and intrahepatic bile duct: Secondary | ICD-10-CM

## 2015-08-20 ENCOUNTER — Ambulatory Visit (HOSPITAL_COMMUNITY)
Admission: RE | Admit: 2015-08-20 | Discharge: 2015-08-20 | Disposition: A | Discharge status: Home / Self Care | Payer: Medicare Other | Source: Ambulatory Visit | Attending: Interventional Radiology | Admitting: Interventional Radiology

## 2015-08-20 DIAGNOSIS — K7689 Other specified diseases of liver: Secondary | ICD-10-CM | POA: Diagnosis not present

## 2015-08-20 DIAGNOSIS — C787 Secondary malignant neoplasm of liver and intrahepatic bile duct: Secondary | ICD-10-CM

## 2015-08-20 MED ORDER — GADOBENATE DIMEGLUMINE 529 MG/ML IV SOLN
15.0000 mL | Freq: Once | INTRAVENOUS | Status: AC | PRN
  Administered 2015-08-20: 13 mL via INTRAVENOUS

## 2015-08-21 ENCOUNTER — Telehealth: Payer: Self-pay | Admitting: *Deleted

## 2015-08-21 NOTE — Telephone Encounter (Signed)
Pt returned call, informed her of MRI result. Only 2 lesions, per Dr. Benay Spice. Proceed with ablation as planned. Pt voiced understanding. Reports it may not be scheduled until early Feb.

## 2015-08-21 NOTE — Telephone Encounter (Signed)
-----   Message from Cynthia Pier, MD sent at 08/20/2015  7:57 PM EST ----- Please call patient, MRI confirms only 2 liver lesions, proceed with ablation as planned by radiology

## 2015-08-24 ENCOUNTER — Other Ambulatory Visit: Payer: Self-pay | Admitting: Interventional Radiology

## 2015-08-24 DIAGNOSIS — C189 Malignant neoplasm of colon, unspecified: Secondary | ICD-10-CM

## 2015-08-24 DIAGNOSIS — C787 Secondary malignant neoplasm of liver and intrahepatic bile duct: Principal | ICD-10-CM

## 2015-08-25 ENCOUNTER — Encounter: Payer: Self-pay | Admitting: Radiation Oncology

## 2015-08-25 NOTE — Progress Notes (Addendum)
GI Location of Tumor / Histology: Metastatic Colon cancer  Involving right liver lesion   Cynthia [name] presented  months ago with symptoms of:   Biopsies of  (if applicable) revealed: Diagnosis 07/30/15: Dr. Vernard Gambles.,MD Liver, needle/core biopsy, right lobe - ADENOCARCINOMA.consuistent with a primary colorectal tumor   Past/Anticipated interventions by surgeon, if any:  CT  : 08/17/14 appt with Dr. Otho Ket;  Right thermal Ablation Liver Dr. Cherie Dark 09/25/2015  Past/Anticipated interventions by medical oncology, if any:Dr. Sherrill,08/06/15 and   Ned Card, f/u2/2/17  Weight changes, if any: NO Bowel/Bladder complaints, if IN:[id number]  Nausea / Vomiting, if any: NO  Pain issues, if any: No  Any blood per rectum:   NO  SAFETY ISSUES: yes  Prior radiation? NO  Pacemaker/ICD?  NO  Possible current pregnancy?  N/A  Is the patient on methotrexate? NO   Widowed, 3 living children, 1 deceased,  c/o: HX remote cervical cancer, Mod diff adenocarcinoma of the cecum,stage III (T4,N1) s/p laparoscopic assisted right colectomy 10/22/13, hx DVT 10/24/13, CVA May 2016, non smoker, no alcohol or drug use,no smokeless tobacco Sister cancer, dx 3 years ago, age 69 now, Leukemia, living, taking chemo pill   Numbnes left side of head all the way down from stroke that was in the middle of May 2016,  BP 156/88 mmHg  Pulse 92  Temp(Src) 98.4 F (36.9 C) (Oral)  Resp 16  Ht 5\' 2"  (1.575 m)  Wt 143 lb 4.8 oz (65 kg)  BMI 26.20 kg/m2  Wt Readings from Last 3 Encounters:  08/26/15 143 lb 4.8 oz (65 kg)  08/18/15 139 lb (63.05 kg)  08/06/15 140 lb 8 oz (63.73 kg)

## 2015-08-26 ENCOUNTER — Ambulatory Visit
Admission: RE | Admit: 2015-08-26 | Discharge: 2015-08-26 | Disposition: A | Discharge status: Home / Self Care | Payer: Medicare Other | Source: Ambulatory Visit | Attending: Radiation Oncology | Admitting: Radiation Oncology

## 2015-08-26 ENCOUNTER — Encounter: Payer: Self-pay | Admitting: Radiation Oncology

## 2015-08-26 VITALS — BP 156/88 | HR 92 | Temp 98.4°F | Resp 16 | Ht 62.0 in | Wt 143.3 lb

## 2015-08-26 DIAGNOSIS — Z803 Family history of malignant neoplasm of breast: Secondary | ICD-10-CM | POA: Insufficient documentation

## 2015-08-26 DIAGNOSIS — C787 Secondary malignant neoplasm of liver and intrahepatic bile duct: Secondary | ICD-10-CM | POA: Diagnosis not present

## 2015-08-26 DIAGNOSIS — Z7982 Long term (current) use of aspirin: Secondary | ICD-10-CM | POA: Insufficient documentation

## 2015-08-26 DIAGNOSIS — Z8249 Family history of ischemic heart disease and other diseases of the circulatory system: Secondary | ICD-10-CM | POA: Diagnosis not present

## 2015-08-26 DIAGNOSIS — I10 Essential (primary) hypertension: Secondary | ICD-10-CM | POA: Insufficient documentation

## 2015-08-26 DIAGNOSIS — Z9049 Acquired absence of other specified parts of digestive tract: Secondary | ICD-10-CM | POA: Insufficient documentation

## 2015-08-26 DIAGNOSIS — Z85038 Personal history of other malignant neoplasm of large intestine: Secondary | ICD-10-CM | POA: Diagnosis not present

## 2015-08-26 DIAGNOSIS — C20 Malignant neoplasm of rectum: Secondary | ICD-10-CM

## 2015-08-26 DIAGNOSIS — Z8541 Personal history of malignant neoplasm of cervix uteri: Secondary | ICD-10-CM | POA: Insufficient documentation

## 2015-08-26 DIAGNOSIS — Z8673 Personal history of transient ischemic attack (TIA), and cerebral infarction without residual deficits: Secondary | ICD-10-CM | POA: Diagnosis not present

## 2015-08-26 DIAGNOSIS — Z833 Family history of diabetes mellitus: Secondary | ICD-10-CM | POA: Diagnosis not present

## 2015-08-26 DIAGNOSIS — Z79899 Other long term (current) drug therapy: Secondary | ICD-10-CM | POA: Diagnosis not present

## 2015-08-26 DIAGNOSIS — Z9221 Personal history of antineoplastic chemotherapy: Secondary | ICD-10-CM | POA: Insufficient documentation

## 2015-08-26 HISTORY — DX: Enterocolitis due to Clostridium difficile, not specified as recurrent: A04.72

## 2015-08-26 HISTORY — DX: Cerebral infarction, unspecified: I63.9

## 2015-08-26 NOTE — Progress Notes (Addendum)
Radiation Oncology         [phone] ________________________________  Name: Cynthia [name] MRN: [id number]  Date: 08/26/2015  DOB: 12/22/1935  HL:[id number],SLHTDS A, MD  Ladell Pier, MD     REFERRING PHYSICIAN: Ladell Pier, MD   DIAGNOSIS: The encounter diagnosis was Metastasis to liver [facility]).    Adenocarcinoma of the cecum,stage III (T4,N1) with liver metastases   HISTORY OF PRESENT ILLNESS::Cynthia [name] is a 80 y.o. female who is seen for an initial consultation visit regarding the patient's diagnosis of invasive adenocarcinoma of the cecum in February, 2015 now with liver metastases.    She underwent right hemicolectomy and lymph node dissection on 10/22/2013 demonstrating a 7 cm maximum diameter cecal adenocarcinoma with moderate differentiation. One of 15 resected lymph nodes were positive for metastatic carcinoma. She underwent 8 cycles of adjuvant Xeloda from May, 2015 until November, 2015. CT on 07/16/2015 demonstrated 2 new liver lesions with a roughly 1.7 cm inferior right lobe lesion in segment 6 and a 1.2 cm lesion in the superior right lobe in segment 7. PET scan on 07/28/2015 demonstrated increased metabolic activity in the inferior 1.7 cm right lobe liver lesion with SUV max of 6.5. Increased metabolic activity was not identified in the area of possible superior right lobe lesion. CEA has not been elevated with the latest CEA 1.7 on 07/16/2015.   The patient's case was discussed in GI clinic. The patient met in consultation for thermal ablation potentially to the two live lesions with Dr.Yamagata. The patient is scheduled for ablation on February 10th.   On today's visit, the patient does not have any physical complaints.  PREVIOUS RADIATION THERAPY: No   PAST MEDICAL HISTORY:  has a past medical history of Anemia; Blood transfusion without reported diagnosis (jan 2015); Heart murmur; Hypertension; Liver cyst; Complication of anesthesia; Cervical  cancer ([place]); Colon cancer ([place]); Colon cancer ([place]); Cancer ([place]); C. difficile colitis (03/05/14/03/22/14/04/05/14); and Stroke ([place]) (12/2014).     PAST SURGICAL HISTORY: Past Surgical History  Procedure Laterality Date  . Abdominal hysterectomy      vaginal- partial  . Back surgery  1981    lower lumbar  . Tubal ligation  1968  . Laparoscopic right colectomy Right 10/22/2013    Procedure: LAPAROSCOPIC RIGHT COLECTOMY ;  Surgeon: [name], MD;  Location: [facility];  Service: General;  Laterality: Right;  . Salpingoophorectomy  10/22/2013    Procedure: [place] Saa;  Surgeon: [name]. [name], MD;  Location: [facility]S;  Service: Gynecology;;     FAMILY HISTORY: family history includes Aneurysm in her paternal grandmother; Breast cancer in her maternal aunt; Cancer in her maternal grandmother and sister; Diabetes in her brother and sister; Heart failure in her brother, father, and maternal grandfather; Hypertension in her brother; Rheum arthritis in her mother.   SOCIAL HISTORY:  reports that she has never smoked. She has never used smokeless tobacco. She reports that she does not drink alcohol or use illicit drugs.   ALLERGIES: Review of patient's allergies indicates no known allergies.   MEDICATIONS:  Current Outpatient Prescriptions  Medication Sig Dispense Refill  . aspirin EC 81 MG tablet Take 1 tablet (81 mg total) by mouth daily. 90 tablet 0  . atorvastatin (LIPITOR) 40 MG tablet Take 1 tablet (40 mg total) by mouth daily. 90 tablet 0  . metoprolol succinate (TOPROL-XL) 25 MG 24 hr tablet Take 25 mg by mouth every evening.    . Multiple Vitamin (MULTIVITAMIN WITH  MINERALS) TABS tablet Take 1 tablet by mouth daily.     No current facility-administered medications for this encounter.     REVIEW OF SYSTEMS:  A 15 point review of systems is documented in the electronic medical record. This was obtained by the nursing staff. However, I reviewed this with the  patient to discuss relevant findings and make appropriate changes.  Pertinent items are noted in HPI.    PHYSICAL EXAM:  height is 5' 2"  (1.575 m) and weight is 143 lb 4.8 oz (65 kg). Her oral temperature is 98.4 F (36.9 C). Her blood pressure is 156/88 and her pulse is 92. Her respiration is 16.    ECOG = 0  0 - Asymptomatic (Fully active, able to carry on all predisease activities without restriction)  1 - Symptomatic but completely ambulatory (Restricted in physically strenuous activity but ambulatory and able to carry out work of a light or sedentary nature. For example, light housework, office work)  2 - Symptomatic, <50% in bed during the day (Ambulatory and capable of all self care but unable to carry out any work activities. Up and about more than 50% of waking hours)  3 - Symptomatic, >50% in bed, but not bedbound (Capable of only limited self-care, confined to bed or chair 50% or more of waking hours)  4 - Bedbound (Completely disabled. Cannot carry on any self-care. Totally confined to bed or chair)  5 - Death   Eustace Pen MM, Creech RH, Tormey DC, et al. [phone]). "Toxicity and response criteria of the  [facility]". [place] Oncol. 5 (6): 649-55  _   LABORATORY DATA:  Lab Results  Component Value Date   WBC 5.2 07/30/2015   HGB 13.4 07/30/2015   HCT 40.8 07/30/2015   MCV 90.7 07/30/2015   PLT 217 07/30/2015   Lab Results  Component Value Date   NA 141 07/16/2015   K 4.4 07/16/2015   CL 104 01/11/2015   CO2 26 07/16/2015   Lab Results  Component Value Date   ALT 14 07/16/2015   AST 27 07/16/2015   ALKPHOS 44 07/16/2015   BILITOT 0.65 07/16/2015      RADIOGRAPHY: Mr Abdomen W Wo Contrast  08/20/2015  CLINICAL DATA:  Adenocarcinoma, liver metastasis with primary indicative of colorectal adenocarcinoma. Evaluate hypermetabolic liver lesions discovered on prior PET-CT. EXAM: MRI ABDOMEN WITHOUT AND WITH CONTRAST TECHNIQUE: Multiplanar  multisequence MR imaging of the abdomen was performed both before and after the administration of intravenous contrast. CONTRAST:  31m MULTIHANCE GADOBENATE DIMEGLUMINE 529 MG/ML IV SOLN COMPARISON:  07/28/2015 PET.  07/16/2015 abdominal pelvic CT. FINDINGS: Mild to moderate motion degradation, primarily affecting the pre and postcontrast dynamic series. Lower chest: Normal heart size without pericardial or pleural effusion. Hepatobiliary: Multiple hepatic cysts. Tiny hepatic dome lesion described on the 07/16/2015 CT is best visualized on image 17/ series 6, measuring on the order of 9 mm. This demonstrates probable restrict diffusion on image 42/series 4 and peripheral post-contrast enhancement, most apparent on image 37/series 12. Similar to slightly decreased from 12 mm on the prior CT. Posterior segment right liver lobe (segment 6) lesion is again identified. This measures 1.6 x 0.9 cm on image 61/ series 1002. Minimally decreased from 1.7 x 1.1 cm on 07/16/2015. This demonstrates definite restricted diffusion including on image 54/series 4. Peripheral post-contrast enhancement. No new suspicious liver lesion identified. Normal gallbladder, without biliary ductal dilatation. Pancreas: Normal, without mass or ductal dilatation. Spleen: Normal in size, without  focal abnormality. Adrenals/Urinary Tract: Normal adrenal glands. Normal kidneys, without hydronephrosis. Stomach/Bowel: Proximal gastric underdistention. Large and small bowel loops unremarkable. Vascular/Lymphatic: Normal caliber of the aorta and branch vessels. No retroperitoneal or retrocrural adenopathy. Other: No ascites. Musculoskeletal: No acute osseous abnormality. IMPRESSION: 1. Similar to slight decrease in size of 2 hepatic metastasis. Given definite restricted diffusion in the larger lesion and possible restricted diffusion in the smaller lesion, residual viable tumor is favored. 2. No new liver lesions or extrahepatic metastatic disease  identified. 3. Motion degraded exam, especially the pre and postcontrast dynamic portions. Electronically Signed   By: Abigail Miyamoto M.D.   On: 08/20/2015 10:14   US Biopsy  07/30/2015  CLINICAL DATA:  History of colon carcinoma. Hypermetabolic right lobe liver lesion. EXAM: ULTRASOUND-GUIDED CORE LIVER BIOPSY TECHNIQUE: An ultrasound guided liver biopsy was thoroughly discussed with the patient and questions were answered. The benefits, risks, alternatives, and complications were also discussed. The patient understands and wishes to proceed with the procedure. A verbal as well as written consent was obtained. Survey ultrasound of the liver was performed, the right lobe lesion localized, and an appropriate skin entry site was determined. Skin site was marked, prepped with Betadine, and draped in usual sterile fashion, and infiltrated locally with 1% lidocaine. Intravenous Fentanyl and Versed were administered as conscious sedation during continuous cardiorespiratory monitoring by the radiology RN, with a total moderate sedation time of less than 30 minutes. A 17 gauge trocar needle was advanced under ultrasound guidance to the margin of the lesion. 2 coaxial 18gauge core samples were then obtained through the guide needle. The guide needle was removed. Post procedure scans demonstrate no apparent complication. COMPLICATIONS: COMPLICATIONS None immediate FINDINGS: 12 mm subcapsular lesion at identified in the right lobe near the tip of the liver corresponding to lesion on PET-CT. Core biopsy samples obtained as above. IMPRESSION: 1. Technically successful ultrasound guided core liver lesion biopsy. Electronically Signed   By: Lucrezia Europe M.D.   On: 07/30/2015 14:46       IMPRESSION:  The patient has been diagnosed with adenocarcinoma of the cecum,stage III (T4,N1) with liver metastases. 2 metastases have been seen on recent imaging with one metastasis superiorly near the diaphragm. The patient is currently  scheduled for thermal ablation of the two liver lesions on February 10th. However, if ablation is unable to be performed on the superior lesion, then the patient would be a good candidate for stereotactic body radiation treatment to this liver metastasis. The patient would receive 3 treatments.  PLAN: We discussed the possible side effects and risks of treatment in addition to the possible benefits of treatment. We discussed the protocol for radiation treatment.  All of the patient's questions were answered.   I will continue to follow the patient's case and will look out for the results of her ablation.  The patient was seen today for 30 minutes, with the majority of the time spent counseling the patient on his diagnosis of cancer and coordinating his care.   ________________________________   Jodelle Gross, MD, PhD   **Disclaimer: This note was dictated with voice recognition software. Similar sounding words can inadvertently be transcribed and this note may contain transcription errors which may not have been corrected upon publication of note.**  This document serves as a record of services personally performed by Kyung Rudd, MD. It was created on his behalf by Derek Mound, a trained medical scribe. The creation of this record is based on the scribe's personal  observations and the provider's statements to them. This document has been checked and approved by the attending provider.

## 2015-08-26 NOTE — Progress Notes (Signed)
Please see the Nurse Progress Note in the MD Initial Consult Encounter for this patient. 

## 2015-08-28 ENCOUNTER — Encounter: Payer: Self-pay | Admitting: Radiation Oncology

## 2015-08-28 DIAGNOSIS — C787 Secondary malignant neoplasm of liver and intrahepatic bile duct: Secondary | ICD-10-CM | POA: Insufficient documentation

## 2015-09-04 ENCOUNTER — Other Ambulatory Visit: Payer: Self-pay | Admitting: Radiology

## 2015-09-16 DIAGNOSIS — G610 Guillain-Barre syndrome: Secondary | ICD-10-CM

## 2015-09-16 HISTORY — DX: Guillain-Barre syndrome: G61.0

## 2015-09-17 ENCOUNTER — Ambulatory Visit (HOSPITAL_BASED_OUTPATIENT_CLINIC_OR_DEPARTMENT_OTHER): Payer: Medicare Other | Admitting: Nurse Practitioner

## 2015-09-17 VITALS — BP 167/98 | HR 84 | Temp 98.4°F | Resp 18 | Wt 141.2 lb

## 2015-09-17 DIAGNOSIS — C180 Malignant neoplasm of cecum: Secondary | ICD-10-CM

## 2015-09-17 DIAGNOSIS — C787 Secondary malignant neoplasm of liver and intrahepatic bile duct: Secondary | ICD-10-CM | POA: Diagnosis not present

## 2015-09-17 DIAGNOSIS — C189 Malignant neoplasm of colon, unspecified: Secondary | ICD-10-CM

## 2015-09-17 DIAGNOSIS — Z8541 Personal history of malignant neoplasm of cervix uteri: Secondary | ICD-10-CM | POA: Diagnosis not present

## 2015-09-17 DIAGNOSIS — Z86718 Personal history of other venous thrombosis and embolism: Secondary | ICD-10-CM

## 2015-09-17 NOTE — Progress Notes (Addendum)
  Cynthia [name] OFFICE PROGRESS NOTE   Diagnosis:  Colon cancer  INTERVAL HISTORY:   Cynthia [name] returns as scheduled. He feels well. She denies pain. No nausea or vomiting. Bowels moving regularly. She has a good appetite.  Objective:  Vital signs in last 24 hours:  Blood pressure 167/98, pulse 84, temperature 98.4 F (36.9 C), temperature source Oral, resp. rate 18, weight 141 lb 3.2 oz (64.048 kg), SpO2 99 %.    HEENT: No thrush or ulcers. Resp: Lungs clear bilaterally. Cardio: Regular rate and rhythm. GI: Abdomen soft and nontender. No hepatomegaly. Vascular: No leg edema.   Lab Results:  Lab Results  Component Value Date   WBC 5.2 07/30/2015   HGB 13.4 07/30/2015   HCT 40.8 07/30/2015   MCV 90.7 07/30/2015   PLT 217 07/30/2015   NEUTROABS 4.0 01/11/2015    Imaging:  No results found.  Medications: I have reviewed the patient's current medications.  Assessment/Plan: 1. Moderately differentiated adenocarcinoma of the cecum, stage III (T4a, N1), status post a laparoscopic assisted right colectomy 10/22/2013  The tumor returned microsatellite stable with normal mismatch repair protein expression   Cycle 1 adjuvant Xeloda 12/25/2013.   Cycle 2 adjuvant Xeloda 01/15/2014.   Cycle 3 adjuvant Xeloda 02/05/2014.   Cycle 4 adjuvant Xeloda 03/18/2014, discontinued after 4 days secondary to diarrhea   Cycle 5 adjuvant Xeloda 04/16/2014, Xeloda dose reduced to 1000 mg in the a.m. and 500 mg in the p.m.   Cycle 6 adjuvant Xeloda 05/07/2014.  Cycle 7 adjuvant Xeloda 05/28/2014.  Cycle 8 adjuvant Xeloda 06/18/2014.  Restaging CTs on 07/14/2014 with no evidence of metastatic colon cancer  Restaging CTs 07/16/2015 with 2 new hypodense liver lesions concerning for metastases, no other evidence of disease progression  PET scan 07/28/2015 with a single hypermetabolic right liver lesion, no other evidence of metastatic  disease  Ultrasound-guided biopsy of the right liver lesion 07/30/2015 confirmed metastatic colon cancer  MRI abdomen 08/20/2015 with similar to slight decrease in size of 2 hepatic metastasis. No new liver lesions or extrahepatic metastatic disease identified. 2. Right ovary cystadenofibroma, status post a bilateral oophorectomy 10/22/2013 3. Remote history of cervical cancer. 4. Iron deficiency anemia-resolved. 5. Right calf deep vein thrombosis 10/24/2013-she completed 3 months of xarelto. 6. Indeterminate 12 mm hypermetabolic right pelvic density on the staging PET scan 10/04/2013-CT followup recommended per the GI tumor conference 11/13/2013. No longer visualized on a CT 07/14/2014. 7. Soft tissue implant overlying the posterior right liver on the CT 59/93/5701-XBL hypermetabolic on the PET scan 39/10/90. 8. C. difficile colitis 03/05/2014. She completed a course of Flagyl, recurrent diarrhea beginning 03/21/2014, positive C. difficile toxin 03/26/2014-resumed Flagyl for planned 10 day course.  Vancomycin beginning 04/08/2014.  Taper complete 05/21/2014.  9. CVA May 2016 10. Skin infection at the right first MTP joint- Keflex prescribed 08/06/2015   Disposition: Cynthia [name] appears stable. She is scheduled to undergo CT guided thermal ablation of the metastatic disease in the liver 09/25/2015. We scheduled a return visit here in 2 months.  Patient seen with Dr. Benay Spice.    Ned Card ANP/GNP-BC   09/17/2015  12:31 PM   This was a shared visit with Ned Card. Cynthia [name] will undergo ablation of the liver lesions next week. She will return for an office visit here in 2 months.  Julieanne Manson, M.D.

## 2015-09-17 NOTE — Patient Instructions (Addendum)
TALIJAH CHESEBRO  09/17/2015   Your procedure is scheduled on: 09-25-15  Report to [facility] Main  Entrance take [facility]  elevators to 3rd floor to  [place] at 630 AM.  Call this number if you have problems the morning of surgery [phone]   Remember: ONLY 1 PERSON MAY GO WITH YOU TO SHORT STAY TO GET  READY MORNING OF [place].  Do not eat food or drink liquids :After Midnight.     Take these medicines the morning of surgery with A SIP OF WATER: NONE                               You may not have any metal on your body including hair pins and              piercings  Do not wear jewelry, make-up, lotions, powders or perfumes, deodorant             Do not wear nail polish.  Do not shave  48 hours prior to surgery.              Men may shave face and neck.   Do not bring valuables to the hospital. [place].  Contacts, dentures or bridgework may not be worn into surgery.  Leave suitcase in the car. After surgery it may be brought to your room.                  Please read over the following fact sheets you were given: _____________________________________________________________________             [facility] - Preparing for Surgery Before surgery, you can play an important role.  Because skin is not sterile, your skin needs to be as free of germs as possible.  You can reduce the number of germs on your skin by washing with CHG (chlorahexidine gluconate) soap before surgery.  CHG is an antiseptic cleaner which kills germs and bonds with the skin to continue killing germs even after washing. Please DO NOT use if you have an allergy to CHG or antibacterial soaps.  If your skin becomes reddened/irritated stop using the CHG and inform your nurse when you arrive at Short Stay. Do not shave (including legs and underarms) for at least 48 hours prior to the first CHG shower.  You may shave your  face/neck. Please follow these instructions carefully:  1.  Shower with CHG Soap the night before surgery and the  morning of Surgery.  2.  If you choose to wash your hair, wash your hair first as usual with your  normal  shampoo.  3.  After you shampoo, rinse your hair and body thoroughly to remove the  shampoo.                           4.  Use CHG as you would any other liquid soap.  You can apply chg directly  to the skin and wash                       Gently with a scrungie or clean washcloth.  5.  Apply the CHG Soap to  your body ONLY FROM THE NECK DOWN.   Do not use on face/ open                           Wound or open sores. Avoid contact with eyes, ears mouth and genitals (private parts).                       Wash face,  Genitals (private parts) with your normal soap.             6.  Wash thoroughly, paying special attention to the area where your surgery  will be performed.  7.  Thoroughly rinse your body with warm water from the neck down.  8.  DO NOT shower/wash with your normal soap after using and rinsing off  the CHG Soap.                9.  Pat yourself dry with a clean towel.            10.  Wear clean pajamas.            11.  Place clean sheets on your bed the night of your first shower and do not  sleep with pets. Day of Surgery : Do not apply any lotions/deodorants the morning of surgery.  Please wear clean clothes to the hospital/surgery center.  FAILURE TO FOLLOW THESE INSTRUCTIONS MAY RESULT IN THE CANCELLATION OF YOUR SURGERY PATIENT SIGNATURE_________________________________  NURSE SIGNATURE__________________________________  ________________________________________________________________________  WHAT IS A BLOOD TRANSFUSION? Blood Transfusion Information  A transfusion is the replacement of blood or some of its parts. Blood is made up of multiple cells which provide different functions.  Red blood cells carry oxygen and are used for blood loss  replacement.  White blood cells fight against infection.  Platelets control bleeding.  Plasma helps clot blood.  Other blood products are available for specialized needs, such as hemophilia or other clotting disorders. BEFORE THE TRANSFUSION  Who gives blood for transfusions?   Healthy volunteers who are fully evaluated to make sure their blood is safe. This is blood bank blood. Transfusion therapy is the safest it has ever been in the practice of medicine. Before blood is taken from a donor, a complete history is taken to make sure that person has no history of diseases nor engages in risky social behavior (examples are intravenous drug use or sexual activity with multiple partners). The donor's travel history is screened to minimize risk of transmitting infections, such as malaria. The donated blood is tested for signs of infectious diseases, such as HIV and hepatitis. The blood is then tested to be sure it is compatible with you in order to minimize the chance of a transfusion reaction. If you or a relative donates blood, this is often done in anticipation of surgery and is not appropriate for emergency situations. It takes many days to process the donated blood. RISKS AND COMPLICATIONS Although transfusion therapy is very safe and saves many lives, the main dangers of transfusion include:   Getting an infectious disease.  Developing a transfusion reaction. This is an allergic reaction to something in the blood you were given. Every precaution is taken to prevent this. The decision to have a blood transfusion has been considered carefully by your caregiver before blood is given. Blood is not given unless the benefits outweigh the risks. AFTER THE TRANSFUSION  Right after receiving a blood transfusion, you will usually  feel much better and more energetic. This is especially true if your red blood cells have gotten low (anemic). The transfusion raises the level of the red blood cells which  carry oxygen, and this usually causes an energy increase.  The nurse administering the transfusion will monitor you carefully for complications. HOME CARE INSTRUCTIONS  No special instructions are needed after a transfusion. You may find your energy is better. Speak with your caregiver about any limitations on activity for underlying diseases you may have. SEEK MEDICAL CARE IF:   Your condition is not improving after your transfusion.  You develop redness or irritation at the intravenous (IV) site. SEEK IMMEDIATE MEDICAL CARE IF:  Any of the following symptoms occur over the next 12 hours:  Shaking chills.  You have a temperature by mouth above 102 F (38.9 C), not controlled by medicine.  Chest, back, or muscle pain.  People around you feel you are not acting correctly or are confused.  Shortness of breath or difficulty breathing.  Dizziness and fainting.  You get a rash or develop hives.  You have a decrease in urine output.  Your urine turns a dark color or changes to pink, red, or brown. Any of the following symptoms occur over the next 10 days:  You have a temperature by mouth above 102 F (38.9 C), not controlled by medicine.  Shortness of breath.  Weakness after normal activity.  The white part of the eye turns yellow (jaundice).  You have a decrease in the amount of urine or are urinating less often.  Your urine turns a dark color or changes to pink, red, or brown. Document Released: 07/29/2000 Document Revised: 10/24/2011 Document Reviewed: 03/17/2008 [facility] Patient Information 2014 [place], [place].  _______________________________________________________________________

## 2015-09-17 NOTE — Progress Notes (Signed)
CHEST CT 07-16-15 EPIC ECHO 01-12-15 EPIC CAROTID DUPLEX 01-12-15 EPIC EKG 01-11-15 EPIC

## 2015-09-18 ENCOUNTER — Telehealth: Payer: Self-pay | Admitting: Oncology

## 2015-09-18 NOTE — Telephone Encounter (Signed)
Lft msg for pt confirming MD visit per 02/02 POF, mailed out schedule to pt.Marland Kitchen KJ

## 2015-09-21 ENCOUNTER — Encounter (HOSPITAL_COMMUNITY)
Admission: RE | Admit: 2015-09-21 | Discharge: 2015-09-21 | Disposition: A | Discharge status: Home / Self Care | Payer: Medicare Other | Source: Ambulatory Visit | Attending: Interventional Radiology | Admitting: Interventional Radiology

## 2015-09-21 ENCOUNTER — Encounter (HOSPITAL_COMMUNITY): Payer: Self-pay

## 2015-09-21 DIAGNOSIS — Z01812 Encounter for preprocedural laboratory examination: Secondary | ICD-10-CM | POA: Insufficient documentation

## 2015-09-21 DIAGNOSIS — Z0183 Encounter for blood typing: Secondary | ICD-10-CM | POA: Insufficient documentation

## 2015-09-21 DIAGNOSIS — K769 Liver disease, unspecified: Secondary | ICD-10-CM | POA: Insufficient documentation

## 2015-09-21 HISTORY — DX: Tinnitus, bilateral: H93.13

## 2015-09-21 LAB — CBC
HEMATOCRIT: 40.2 % (ref 36.0–46.0)
HEMOGLOBIN: 13.1 g/dL (ref 12.0–15.0)
MCH: 29.3 pg (ref 26.0–34.0)
MCHC: 32.6 g/dL (ref 30.0–36.0)
MCV: 89.9 fL (ref 78.0–100.0)
Platelets: 232 10*3/uL (ref 150–400)
RBC: 4.47 MIL/uL (ref 3.87–5.11)
RDW: 14.2 % (ref 11.5–15.5)
WBC: 5.6 10*3/uL (ref 4.0–10.5)

## 2015-09-21 LAB — COMPREHENSIVE METABOLIC PANEL
ALBUMIN: 4.2 g/dL (ref 3.5–5.0)
ALT: 17 U/L (ref 14–54)
ANION GAP: 9 (ref 5–15)
AST: 28 U/L (ref 15–41)
Alkaline Phosphatase: 42 U/L (ref 38–126)
BILIRUBIN TOTAL: 0.8 mg/dL (ref 0.3–1.2)
BUN: 15 mg/dL (ref 6–20)
CHLORIDE: 105 mmol/L (ref 101–111)
CO2: 28 mmol/L (ref 22–32)
Calcium: 10 mg/dL (ref 8.9–10.3)
Creatinine, Ser: 0.87 mg/dL (ref 0.44–1.00)
GFR calc Af Amer: >60 mL/min (ref >60)
GFR calc non Af Amer: >60 mL/min (ref >60)
GLUCOSE: 89 mg/dL (ref 65–99)
POTASSIUM: 4.8 mmol/L (ref 3.5–5.1)
SODIUM: 142 mmol/L (ref 135–145)
TOTAL PROTEIN: 7.2 g/dL (ref 6.5–8.1)

## 2015-09-21 LAB — PROTIME-INR
INR: 1.03 (ref 0.00–1.49)
PROTHROMBIN TIME: 13.7 s (ref 11.6–15.2)

## 2015-09-21 LAB — APTT: APTT: 28 s (ref 24–37)

## 2015-09-24 ENCOUNTER — Other Ambulatory Visit: Payer: Self-pay | Admitting: General Surgery

## 2015-09-25 ENCOUNTER — Ambulatory Visit (HOSPITAL_COMMUNITY): Payer: Medicare Other | Admitting: Certified Registered Nurse Anesthetist

## 2015-09-25 ENCOUNTER — Encounter (HOSPITAL_COMMUNITY): Payer: Self-pay

## 2015-09-25 ENCOUNTER — Ambulatory Visit (HOSPITAL_COMMUNITY)
Admission: RE | Admit: 2015-09-25 | Discharge: 2015-09-25 | Disposition: A | Discharge status: Home / Self Care | Payer: Medicare Other | Source: Ambulatory Visit | Attending: Interventional Radiology | Admitting: Interventional Radiology

## 2015-09-25 ENCOUNTER — Encounter (HOSPITAL_COMMUNITY): Payer: Self-pay | Admitting: Anesthesiology

## 2015-09-25 ENCOUNTER — Observation Stay (HOSPITAL_COMMUNITY)
Admission: RE | Admit: 2015-09-25 | Discharge: 2015-09-26 | Disposition: A | Discharge status: Home / Self Care | Payer: Medicare Other | Source: Ambulatory Visit | Attending: Interventional Radiology | Admitting: Interventional Radiology

## 2015-09-25 ENCOUNTER — Encounter (HOSPITAL_COMMUNITY)
Admission: RE | Disposition: A | Discharge status: Home / Self Care | Payer: Self-pay | Source: Ambulatory Visit | Attending: Interventional Radiology

## 2015-09-25 DIAGNOSIS — C189 Malignant neoplasm of colon, unspecified: Secondary | ICD-10-CM | POA: Diagnosis not present

## 2015-09-25 DIAGNOSIS — Z8673 Personal history of transient ischemic attack (TIA), and cerebral infarction without residual deficits: Secondary | ICD-10-CM | POA: Insufficient documentation

## 2015-09-25 DIAGNOSIS — R109 Unspecified abdominal pain: Secondary | ICD-10-CM | POA: Insufficient documentation

## 2015-09-25 DIAGNOSIS — D649 Anemia, unspecified: Secondary | ICD-10-CM | POA: Insufficient documentation

## 2015-09-25 DIAGNOSIS — I739 Peripheral vascular disease, unspecified: Secondary | ICD-10-CM | POA: Insufficient documentation

## 2015-09-25 DIAGNOSIS — C787 Secondary malignant neoplasm of liver and intrahepatic bile duct: Secondary | ICD-10-CM | POA: Diagnosis not present

## 2015-09-25 DIAGNOSIS — I10 Essential (primary) hypertension: Secondary | ICD-10-CM | POA: Insufficient documentation

## 2015-09-25 DIAGNOSIS — Z79899 Other long term (current) drug therapy: Secondary | ICD-10-CM | POA: Insufficient documentation

## 2015-09-25 DIAGNOSIS — Z8541 Personal history of malignant neoplasm of cervix uteri: Secondary | ICD-10-CM | POA: Insufficient documentation

## 2015-09-25 DIAGNOSIS — Z7982 Long term (current) use of aspirin: Secondary | ICD-10-CM | POA: Insufficient documentation

## 2015-09-25 DIAGNOSIS — Z9049 Acquired absence of other specified parts of digestive tract: Secondary | ICD-10-CM | POA: Diagnosis not present

## 2015-09-25 LAB — TYPE AND SCREEN
ABO/RH(D): A POS
Antibody Screen: NEGATIVE

## 2015-09-25 SURGERY — RADIO FREQUENCY ABLATION
Anesthesia: General | Laterality: Right

## 2015-09-25 MED ORDER — ROCURONIUM BROMIDE 100 MG/10ML IV SOLN
INTRAVENOUS | Status: DC | PRN
  Administered 2015-09-25: 50 mg via INTRAVENOUS
  Administered 2015-09-25: 20 mg via INTRAVENOUS

## 2015-09-25 MED ORDER — CEFAZOLIN SODIUM-DEXTROSE 2-3 GM-% IV SOLR
2.0000 g | Freq: Once | INTRAVENOUS | Status: AC
  Administered 2015-09-25: 2 g via INTRAVENOUS
  Filled 2015-09-25 (×2): qty 50

## 2015-09-25 MED ORDER — METOPROLOL SUCCINATE ER 25 MG PO TB24
25.0000 mg | ORAL_TABLET | ORAL | Status: AC
  Administered 2015-09-25: 25 mg via ORAL
  Filled 2015-09-25: qty 1

## 2015-09-25 MED ORDER — METOPROLOL SUCCINATE ER 25 MG PO TB24
25.0000 mg | ORAL_TABLET | Freq: Every evening | ORAL | Status: DC
  Administered 2015-09-25: 25 mg via ORAL
  Filled 2015-09-25 (×2): qty 1

## 2015-09-25 MED ORDER — FENTANYL CITRATE (PF) 250 MCG/5ML IJ SOLN
INTRAMUSCULAR | Status: AC
  Filled 2015-09-25: qty 5

## 2015-09-25 MED ORDER — PROPOFOL 10 MG/ML IV BOLUS
INTRAVENOUS | Status: DC | PRN
  Administered 2015-09-25: 120 mg via INTRAVENOUS

## 2015-09-25 MED ORDER — ONDANSETRON HCL 4 MG/2ML IJ SOLN
INTRAMUSCULAR | Status: DC | PRN
  Administered 2015-09-25: 4 mg via INTRAVENOUS

## 2015-09-25 MED ORDER — PROMETHAZINE HCL 25 MG/ML IJ SOLN
6.2500 mg | INTRAMUSCULAR | Status: DC | PRN
  Administered 2015-09-25: 12.5 mg via INTRAVENOUS

## 2015-09-25 MED ORDER — SENNOSIDES-DOCUSATE SODIUM 8.6-50 MG PO TABS: 1.0000 | ORAL_TABLET | Freq: Every day | ORAL | Status: DC | PRN

## 2015-09-25 MED ORDER — HYDROCODONE-ACETAMINOPHEN 5-325 MG PO TABS
1.0000 | ORAL_TABLET | ORAL | Status: DC | PRN
  Administered 2015-09-25: 2 via ORAL
  Filled 2015-09-25: qty 2

## 2015-09-25 MED ORDER — FENTANYL CITRATE (PF) 100 MCG/2ML IJ SOLN
INTRAMUSCULAR | Status: AC
  Filled 2015-09-25: qty 2

## 2015-09-25 MED ORDER — FENTANYL CITRATE (PF) 100 MCG/2ML IJ SOLN
INTRAMUSCULAR | Status: DC | PRN
  Administered 2015-09-25: 100 ug via INTRAVENOUS
  Administered 2015-09-25 (×3): 50 ug via INTRAVENOUS

## 2015-09-25 MED ORDER — LACTATED RINGERS IV SOLN
INTRAVENOUS | Status: DC
  Administered 2015-09-25 (×2): via INTRAVENOUS

## 2015-09-25 MED ORDER — HYDROMORPHONE HCL 1 MG/ML IJ SOLN: 1.0000 mg | INTRAMUSCULAR | Status: DC | PRN

## 2015-09-25 MED ORDER — HYDROMORPHONE HCL 1 MG/ML IJ SOLN: 0.2500 mg | INTRAMUSCULAR | Status: DC | PRN

## 2015-09-25 MED ORDER — SUGAMMADEX SODIUM 200 MG/2ML IV SOLN
INTRAVENOUS | Status: DC | PRN
  Administered 2015-09-25: 200 mg via INTRAVENOUS

## 2015-09-25 MED ORDER — PHENYLEPHRINE HCL 10 MG/ML IJ SOLN
INTRAMUSCULAR | Status: DC | PRN
  Administered 2015-09-25 (×2): 80 ug via INTRAVENOUS
  Administered 2015-09-25: 40 ug via INTRAVENOUS
  Administered 2015-09-25 (×4): 80 ug via INTRAVENOUS
  Administered 2015-09-25: 40 ug via INTRAVENOUS
  Administered 2015-09-25 (×3): 80 ug via INTRAVENOUS
  Administered 2015-09-25: 40 ug via INTRAVENOUS

## 2015-09-25 MED ORDER — ATORVASTATIN CALCIUM 40 MG PO TABS
40.0000 mg | ORAL_TABLET | Freq: Every evening | ORAL | Status: DC
  Administered 2015-09-25: 40 mg via ORAL
  Filled 2015-09-25 (×2): qty 1

## 2015-09-25 MED ORDER — PROMETHAZINE HCL 25 MG/ML IJ SOLN
INTRAMUSCULAR | Status: AC
  Filled 2015-09-25: qty 1

## 2015-09-25 MED ORDER — DOCUSATE SODIUM 100 MG PO CAPS
100.0000 mg | ORAL_CAPSULE | Freq: Two times a day (BID) | ORAL | Status: DC
  Administered 2015-09-25: 100 mg via ORAL
  Filled 2015-09-25 (×2): qty 1

## 2015-09-25 MED ORDER — ONDANSETRON HCL 4 MG/2ML IJ SOLN
4.0000 mg | Freq: Four times a day (QID) | INTRAMUSCULAR | Status: DC | PRN
  Administered 2015-09-25: 4 mg via INTRAVENOUS
  Filled 2015-09-25: qty 2

## 2015-09-25 MED ORDER — MORPHINE SULFATE 2 MG/ML IV SOLN
INTRAVENOUS | Status: AC
  Filled 2015-09-25: qty 25

## 2015-09-25 MED ORDER — HYDROMORPHONE HCL 2 MG/ML IJ SOLN
INTRAMUSCULAR | Status: AC
  Filled 2015-09-25: qty 1

## 2015-09-25 MED ORDER — EPHEDRINE SULFATE 50 MG/ML IJ SOLN
INTRAMUSCULAR | Status: DC | PRN
  Administered 2015-09-25 (×2): 5 mg via INTRAVENOUS
  Administered 2015-09-25: 10 mg via INTRAVENOUS

## 2015-09-25 MED ORDER — LIDOCAINE HCL (CARDIAC) 20 MG/ML IV SOLN
INTRAVENOUS | Status: DC | PRN
  Administered 2015-09-25: 50 mg via INTRAVENOUS

## 2015-09-25 MED ORDER — SODIUM CHLORIDE 0.9 % IV SOLN
INTRAVENOUS | Status: DC
  Administered 2015-09-25: 13:00:00 via INTRAVENOUS

## 2015-09-25 MED ORDER — PHENYLEPHRINE HCL 10 MG/ML IJ SOLN
10.0000 mg | INTRAVENOUS | Status: DC | PRN
  Administered 2015-09-25: 50 ug/min via INTRAVENOUS

## 2015-09-25 NOTE — Anesthesia Postprocedure Evaluation (Signed)
Anesthesia Post Note  Patient: Cynthia [name]  Procedure(s) Performed: Procedure(s) (LRB): RIGHT THERMAL ABLATION OF LIVER (Right)  Patient location during evaluation: PACU Anesthesia Type: General Level of consciousness: awake and alert Pain management: pain level controlled Vital Signs Assessment: post-procedure vital signs reviewed and stable Respiratory status: spontaneous breathing, nonlabored ventilation, respiratory function stable and patient connected to nasal cannula oxygen Cardiovascular status: blood pressure returned to baseline and stable Postop Assessment: no signs of nausea or vomiting Anesthetic complications: no    Last Vitals:  Filed Vitals:   09/25/15 1230 09/25/15 1250  BP: 144/78 149/74  Pulse: 63 57  Temp:    Resp: 12 14    Last Pain:  Filed Vitals:   09/25/15 1317  PainSc: 0-No pain                 Maddelyn Rocca J

## 2015-09-25 NOTE — Anesthesia Preprocedure Evaluation (Addendum)
Anesthesia Evaluation  Patient identified by MRN, date of birth, ID band Patient awake    Reviewed: Allergy & Precautions, NPO status , Patient's Chart, lab work & pertinent test results  History of Anesthesia Complications (+) history of anesthetic complications  Airway Mallampati: II  TM Distance: >3 FB Neck ROM: Full    Dental no notable dental hx.    Pulmonary neg pulmonary ROS,    Pulmonary exam normal breath sounds clear to auscultation       Cardiovascular Exercise Tolerance: Good hypertension, Pt. on medications and Pt. on home beta blockers + Peripheral Vascular Disease  Normal cardiovascular exam+ Valvular Problems/Murmurs  Rhythm:Regular Rate:Normal  ECHO 01-12-15: Study Conclusions  - Left ventricle: The cavity size was normal. There was mild concentric hypertrophy. Systolic function was vigorous. The estimated ejection fraction was in the range of 65% to 70%. Wall motion was normal; there were no regional wall motion abnormalities. Doppler parameters are consistent with abnormal left ventricular relaxation (grade 1 diastolic dysfunction). - Aortic valve: There was no regurgitation. - Mitral valve: Structurally normal valve. There was no regurgitation. - Left atrium: The atrium was normal in size. - Right ventricle: The cavity size was normal. Wall thickness was normal. Systolic function was normal. - Right atrium: The atrium was normal in size. - Atrial septum: No defect or patent foramen ovale was identified. - Tricuspid valve: Structurally normal valve. There was mild regurgitation. - Pulmonic valve: There was no regurgitation. - Pulmonary arteries: Systolic pressure was within the normal range. - Inferior vena cava: The vessel was normal in size. - Pericardium, extracardiac: A trivial pericardial effusion was identified posterior to the heart. Features were not consistent with  tamponade physiology.    Neuro/Psych PSYCHIATRIC DISORDERS TIACVA    GI/Hepatic negative GI ROS, Neg liver ROS,   Endo/Other  negative endocrine ROS  Renal/GU negative Renal ROS  negative genitourinary   Musculoskeletal negative musculoskeletal ROS (+)   Abdominal   Peds negative pediatric ROS (+)  Hematology  (+) anemia ,   Anesthesia Other Findings   Reproductive/Obstetrics negative OB ROS                            Anesthesia Physical Anesthesia Plan  ASA: III  Anesthesia Plan: General   Post-op Pain Management:    Induction: Intravenous  Airway Management Planned: Oral ETT  Additional Equipment:   Intra-op Plan:   Post-operative Plan: Extubation in OR  Informed Consent: I have reviewed the patients History and Physical, chart, labs and discussed the procedure including the risks, benefits and alternatives for the proposed anesthesia with the patient or authorized representative who has indicated his/her understanding and acceptance.   Dental advisory given  Plan Discussed with: CRNA  Anesthesia Plan Comments:         Anesthesia Quick Evaluation

## 2015-09-25 NOTE — Anesthesia Procedure Notes (Signed)
Procedure Name: Intubation Date/Time: 09/25/2015 8:50 AM Performed by: Deliah Boston Pre-anesthesia Checklist: Patient identified, Emergency Drugs available, Suction available and Patient being monitored Patient Re-evaluated:Patient Re-evaluated prior to inductionOxygen Delivery Method: Circle System Utilized Preoxygenation: Pre-oxygenation with 100% oxygen Intubation Type: IV induction Ventilation: Mask ventilation without difficulty Laryngoscope Size: Mac and 3 Grade View: Grade I Tube type: Oral Tube size: 7.0 mm Number of attempts: 1 Airway Equipment and Method: Oral airway Placement Confirmation: ETT inserted through vocal cords under direct vision,  positive ETCO2 and breath sounds checked- equal and bilateral Secured at: 21 cm Tube secured with: Tape Dental Injury: Teeth and Oropharynx as per pre-operative assessment

## 2015-09-25 NOTE — Progress Notes (Signed)
Patient ID: Cynthia [name], female   DOB: 1935-10-05, 80 y.o.   MRN: [id number] Follow up on patient after Microwave thermal ablation to 2 liver lesions this morning.  Patient is complaining of some abdominal pain on the anterior side of her abdomen.  She just received 2 vicodin about 10 minutes ago.  Abd: soft, tender in RUQ appropriately  A/P: 1. S/p microwave thermal ablation of liver lesions x2 -add IV dilaudid if needed for breakthrough pain -Dc foley around 1800 -may start to mobilize -try some liquids and advance diet as tolerates -plan for DC home tomorrow  [name] E 4:24 PM 09/25/2015

## 2015-09-25 NOTE — H&P (Signed)
Chief Complaint: Patient was seen in consultation today for microwave ablation of hepatic lesions at the request of Dr. Adolph Pollack  Referring Physician(s): Dr. Benay Spice  History of Present Illness: Cynthia [name] is a 80 y.o. female with metastatic colon cancer to the liver. She is found to have 2 separate lesions and was seen in consult by Dr. Kathlene Cote. She is now scheduled for CT guided microwave ablation. Please see full consult note for details. Feels well, no recent illness, fevers, chills. No medications changes. Has been NPO this am  Past Medical History  Diagnosis Date  . Anemia   . Blood transfusion without reported diagnosis jan 2015  . Heart murmur   . Hypertension     no bp meds   . Liver cyst   . C. difficile colitis 03/05/14/03/22/14/04/05/14    recurrent  . Stroke ([place]) 12/2014    NUMBNESS ON LEFT SIDE  . Cervical cancer ([place]) 1972  . Colon cancer ([place]) JAN 2015  . Colon cancer ([place])   . Cancer ([place])     liver  . Complication of anesthesia     slow to awaken in past, DONE WELL RECENTLY  . Tinnitus of both ears ALL THE TIME    Past Surgical History  Procedure Laterality Date  . Back surgery  1981    lower lumbar  . Tubal ligation  1968  . Laparoscopic right colectomy Right 10/22/2013    Procedure: LAPAROSCOPIC RIGHT COLECTOMY ;  Surgeon: [name], MD;  Location: [facility];  Service: General;  Laterality: Right;  . Salpingoophorectomy  10/22/2013    Procedure: [place] Saa;  Surgeon: [name]. [name], MD;  Location: [facility];  Service: Gynecology;;  . Abdominal hysterectomy      vaginal- partial    Allergies: Review of patient's allergies indicates no known allergies.  Medications: Prior to Admission medications   Medication Sig Start Date End Date Taking? Authorizing Provider  aspirin EC 81 MG tablet Take 1 tablet (81 mg total) by mouth daily. Patient taking differently: Take 81 mg by mouth every morning.  01/13/15   Janora Norlander, DO  atorvastatin (LIPITOR) 40 MG tablet Take 1 tablet (40 mg total) by mouth daily. Patient taking differently: Take 40 mg by mouth every evening.  01/13/15   Janora Norlander, DO  metoprolol succinate (TOPROL-XL) 25 MG 24 hr tablet Take 25 mg by mouth every evening. 12/20/14   Historical Provider, MD  Multiple Vitamin (MULTIVITAMIN WITH MINERALS) TABS tablet Take 1 tablet by mouth every morning.     Historical Provider, MD     Family History  Problem Relation Age of Onset  . Diabetes Sister   . Cancer Sister     leukemia  . Diabetes Brother   . Heart failure Brother   . Hypertension Brother   . Breast cancer Maternal Aunt   . Rheum arthritis Mother   . Heart failure Father   . Cancer Maternal Grandmother     GIST  . Heart failure Maternal Grandfather   . Aneurysm Paternal Grandmother     Social History   Social History  . Marital Status: Widowed    Spouse Name: N/A  . Number of Children: N/A  . Years of Education: N/A   Social History Main Topics  . Smoking status: Never Smoker   . Smokeless tobacco: Never Used  . Alcohol Use: No  . Drug Use: No  . Sexual Activity: No   Other Topics Concern  .  None   Social History Narrative     Review of Systems: A 12 point ROS discussed and pertinent positives are indicated in the HPI above.  All other systems are negative.  Review of Systems  Vital Signs: T: 97.9, BP: 169/97, HR: 97, RR: 16  Physical Exam  Constitutional: She is oriented to person, place, and time. She appears well-developed and well-nourished. No distress.  HENT:  Head: Normocephalic.  Mouth/Throat: Oropharynx is clear and moist.  Neck: Normal range of motion. No tracheal deviation present.  Cardiovascular: Normal rate, regular rhythm and normal heart sounds.   Pulmonary/Chest: Effort normal and breath sounds normal. No respiratory distress.  Neurological: She is alert and oriented to person, place, and time.  Skin: Skin is warm and dry.    Psychiatric: She has a normal mood and affect. Judgment normal.    Mallampati Score:  MD Evaluation Airway: WNL Heart: WNL Abdomen: WNL Chest/ Lungs: WNL  Imaging: No results found.  Labs:  CBC:  Recent Labs  01/11/15 1200 01/11/15 1207 01/11/15 1920 07/30/15 1230 09/21/15 1035  WBC 6.4  --   --  5.2 5.6  HGB 13.4 14.6  --  13.4 13.1  HCT 40.6 43.0 36.9 40.8 40.2  PLT 206  --   --  217 232    COAGS:  Recent Labs  01/11/15 1200 07/30/15 1230 09/21/15 1035  INR 1.06 1.07 1.03  APTT 27 22* 28    BMP:  Recent Labs  01/11/15 1200 01/11/15 1207 07/16/15 0755 09/21/15 1035  NA 139 140 141 142  K 4.1 4.0 4.4 4.8  CL 103 104  --  105  CO2 25  --  26 28  GLUCOSE 93 95 87 89  BUN 13 14 14.7 15  CALCIUM 9.2  --  9.5 10.0  CREATININE 0.74 0.80 0.9 0.87  GFRNONAA >60  --   --  >60  GFRAA >60  --   --  >60    LIVER FUNCTION TESTS:  Recent Labs  01/11/15 1200 07/16/15 0755 09/21/15 1035  BILITOT 0.5 0.65 0.8  AST 24 27 28   ALT 13* 14 17  ALKPHOS 42 44 42  PROT 7.4 7.0 7.2  ALBUMIN 4.0 3.9 4.2    TUMOR MARKERS:  Recent Labs  10/16/14 1020 04/16/15 1027 07/16/15 0755  CEA 2.3 2.6 1.7    Assessment and Plan: Metastatic colon cancer to the liver For CT guided microwave ablation of hepatic lesion(s) Procedure reviewed, including risks, complications, plan for overnight admission Labs reviewed, ok Consent signed in chart   Electronically Signed: Ascencion Dike 09/25/2015, 8:27 AM   I spent a total of 20 Minutes  in face to face in clinical consultation, greater than 50% of which was counseling/coordinating care for CT guided cryoablation of liver lesion

## 2015-09-25 NOTE — Transfer of Care (Signed)
Immediate Anesthesia Transfer of Care Note  Patient: Cynthia [name]  Procedure(s) Performed: Procedure(s): RIGHT THERMAL ABLATION OF LIVER (Right)  Patient Location: PACU  Anesthesia Type:General  Level of Consciousness: Patient easily awoken, sedated, comfortable, cooperative, following commands, responds to stimulation.   Airway & Oxygen Therapy: Patient spontaneously breathing, ventilating well, oxygen via simple oxygen mask.  Post-op Assessment: Report given to PACU RN, vital signs reviewed and stable, moving all extremities.   Post vital signs: Reviewed and stable.  Complications: No apparent anesthesia complications

## 2015-09-25 NOTE — Procedures (Signed)
Interventional Radiology Procedure Note  Procedure:  CT guided thermal ablation of liver metastases  Anesthesia:  General  Complications:  None  Estimated Blood Loss: < 10 mL  Inferior right lobe liver metastasis treated with NeuWave PR MWA probe:  10 min cycle at 72 W Superior dome liver metastasis treated with same NeuWave PR probe:  5 min cycle at 65W  Plan:  PACU recovery followed by overnight observation. Cynthia [name]. Kathlene Cote, M.D Pager:  [phone]

## 2015-09-26 ENCOUNTER — Other Ambulatory Visit: Payer: Self-pay | Admitting: Physician Assistant

## 2015-09-26 DIAGNOSIS — C787 Secondary malignant neoplasm of liver and intrahepatic bile duct: Secondary | ICD-10-CM

## 2015-09-26 LAB — COMPREHENSIVE METABOLIC PANEL
ALT: 207 U/L — AB (ref 14–54)
ANION GAP: 7 (ref 5–15)
AST: 371 U/L — ABNORMAL HIGH (ref 15–41)
Albumin: 3.1 g/dL — ABNORMAL LOW (ref 3.5–5.0)
Alkaline Phosphatase: 34 U/L — ABNORMAL LOW (ref 38–126)
BUN: 12 mg/dL (ref 6–20)
CALCIUM: 8.2 mg/dL — AB (ref 8.9–10.3)
CHLORIDE: 105 mmol/L (ref 101–111)
CO2: 27 mmol/L (ref 22–32)
Creatinine, Ser: 0.75 mg/dL (ref 0.44–1.00)
GLUCOSE: 94 mg/dL (ref 65–99)
POTASSIUM: 3.9 mmol/L (ref 3.5–5.1)
SODIUM: 139 mmol/L (ref 135–145)
TOTAL PROTEIN: 5.4 g/dL — AB (ref 6.5–8.1)
Total Bilirubin: 0.8 mg/dL (ref 0.3–1.2)

## 2015-09-26 LAB — CBC
HCT: 33.5 % — ABNORMAL LOW (ref 36.0–46.0)
Hemoglobin: 10.8 g/dL — ABNORMAL LOW (ref 12.0–15.0)
MCH: 29.8 pg (ref 26.0–34.0)
MCHC: 32.2 g/dL (ref 30.0–36.0)
MCV: 92.3 fL (ref 78.0–100.0)
PLATELETS: 181 10*3/uL (ref 150–400)
RBC: 3.63 MIL/uL — AB (ref 3.87–5.11)
RDW: 14.8 % (ref 11.5–15.5)
WBC: 8.3 10*3/uL (ref 4.0–10.5)

## 2015-09-26 NOTE — Progress Notes (Signed)
Discharge instructions reviewed with patient until no more questions ask. Assessment uchanged

## 2015-09-26 NOTE — Care Management Obs Status (Signed)
[facility] NOTIFICATION   Patient Details  Name: Cynthia [name] MRN: [id number] Date of Birth: Oct 02, 1935   Medicare Observation Status Notification Given:  Yes    Erenest Rasher, RN 09/26/2015, 10:14 AM

## 2015-09-26 NOTE — Discharge Summary (Signed)
Patient ID: CHANEKA SANDMAN MRN: [id number] DOB/AGE: 04/22/1936 80 y.o.  Admit date: 09/25/2015 Discharge date: 09/26/2015  Admission Diagnoses: Colon cancer with metastatic disease to the liver  Discharge Diagnoses:  Active Problems:   Colon carcinoma metastatic to liver [facility])   Discharged Condition: good  Hospital Course:   Cynthia [name] is a 80 y.o. female with metastatic colon cancer to the liver.  She was found to have 2 separate lesions and was seen in consult by Dr. Kathlene Cote.   She underwent a CT guided microwave ablation of those lesions yesterday.  She has done very well.    She is tolerating PO. She is not requiring any pain medications.  She is ambulating and voiding normally  Consults: None   Treatments:   CLINICAL DATA: Metastatic colon carcinoma to the liver. Two separate metastatic lesions are present with a larger 1.6 cm biopsy proven metastasis in the posterior and inferior right lobe and a 10 mm lesion at the dome of the liver.  EXAM: CT-GUIDED PERCUTANEOUS THERMAL ABLATION OF LIVER  COMPARISON: MRI of the abdomen on 08/20/2015  ANESTHESIA/SEDATION: Anesthesia: General  Medications: 2 G IV Ancef. As antibiotic prophylaxis, Ancef was ordered pre-procedure and administered intravenously within one hour of incision.  CONTRAST: None.  PROCEDURE: The procedure, risks, benefits, and alternatives were explained to the patient. Questions regarding the procedure were encouraged and answered. The patient understands and consents to the procedure.  The patient was placed under general anesthesia. A time-out was performed. Initial unenhanced CT was performed in a supine and slightly oblique position to localize the liver. Ultrasound was also performed of the liver for procedural planning.  The right abdominal wall was prepped with Betadine in a sterile fashion, and a sterile drape was applied covering the  operative field. A sterile gown and sterile gloves were used for the procedure.  Under CT guidance, a 15 cm length NeuWave PR percutaneous thermal ablation probe was advanced into the inferior right lobe of the liver. Probe positioning was confirmed by CT prior to ablation.  Thermal ablation was performed of the inferior liver lesion using the NeuWave system for 10 minutes at 47 W. Tract cautery was then performed as the probe was retracted.  Additional localizing CT was performed of the superior liver. Under CT guidance, a 15 cm NeuWave PR ablation probe was advanced into the superior liver. Probe positioning was confirmed by CT. Thermal ablation was performed of the superior liver lesion for 5 minutes at 87 W. Tract cautery was performed as the probe was retracted.  Post-procedural CT was performed.  COMPLICATIONS: None  FINDINGS: By ultrasound, neither of the liver lesions was well visualized. Unenhanced CT was able to localize the smaller lesion at the medial dome of the liver. This measures approximately 1.3 cm in greatest diameter. The second lesion in the inferior right lobe posteriorly measures approximately 1.4 x 2.0 cm. Both were able to be successfully ablated. Postprocedure CT shows no evidence of hemorrhage. No pneumothorax is identified at the right lung base.  IMPRESSION: CT guided percutaneous thermal ablation of two separate metastatic lesions of the liver. The patient will be observed overnight. Initial follow-up will be performed in approximately 4 weeks.   Electronically Signed  By: Aletta Edouard M.D.  On: 09/25/2015 13:10  Discharge Exam: Blood pressure 105/57, pulse 60, temperature 98.6 F (37 C), temperature source Oral, resp. rate 18, height 5\' 2"  (1.575 m), weight 141 lb 8 oz (64.184 kg), SpO2 100 %. Awake  and Alert NAD Lungs Clear bilaterally Heart RRR Abdomen soft, NTND RUQ stick site looks good, no  bleeding/bruising  Disposition: 06-Home-Health Care Svc She will follow up with Dr. Kathlene Cote in clinic in about 4 weeks.    Medication List    TAKE these medications        aspirin EC 81 MG tablet  Take 1 tablet (81 mg total) by mouth daily.     atorvastatin 40 MG tablet  Commonly known as:  LIPITOR  Take 1 tablet (40 mg total) by mouth daily.     metoprolol succinate 25 MG 24 hr tablet  Commonly known as:  TOPROL-XL  Take 25 mg by mouth every evening.     multivitamin with minerals Tabs tablet  Take 1 tablet by mouth every morning.          Electronically Signed: Murrell Redden PA-C 09/26/2015, 9:05 AM   I have spent Less Than 30 Minutes discharging Pricilla [name].

## 2015-09-26 NOTE — Care Management Note (Signed)
Case Management Note  Patient Details  Name: BREANNIA PHILIPS MRN: [id number] Date of Birth: 04/21/1936  Subjective/Objective:  Colon cancer with metastatic disease to the liver                  Action/Plan: NCM spoke to pt and dtr, Carma Lair # [phone]. Pt has RW at home. States she is independent at home. She can afford her medications.  PCP Dr Jonathon Jordan Expected Discharge Date:  09/26/2015              Expected Discharge Plan:  Home/Self Care  In-House Referral:  NA  Discharge planning Services  CM Consult  Post Acute Care Choice:  NA Choice offered to:  NA  DME Arranged:  N/A DME Agency:  NA  HH Arranged:  NA HH Agency:  NA  Status of Service:  Completed, signed off  Medicare Important Message Given:    Date Medicare IM Given:    Medicare IM give by:    Date Additional Medicare IM Given:    Additional Medicare Important Message give by:     If discussed at [place] of Stay Meetings, dates discussed:    Additional Comments:  Erenest Rasher, RN 09/26/2015, 10:21 AM

## 2015-09-28 ENCOUNTER — Other Ambulatory Visit (HOSPITAL_COMMUNITY): Payer: Self-pay | Admitting: Interventional Radiology

## 2015-09-28 DIAGNOSIS — C787 Secondary malignant neoplasm of liver and intrahepatic bile duct: Secondary | ICD-10-CM

## 2015-10-06 ENCOUNTER — Inpatient Hospital Stay (HOSPITAL_COMMUNITY)
Admission: EM | Admit: 2015-10-06 | Discharge: 2015-10-27 | DRG: 094 | Disposition: A | Discharge status: Rehabilitation Facility | Payer: Medicare Other | Attending: Internal Medicine | Admitting: Internal Medicine

## 2015-10-06 ENCOUNTER — Emergency Department (HOSPITAL_COMMUNITY): Payer: Medicare Other

## 2015-10-06 ENCOUNTER — Encounter (HOSPITAL_COMMUNITY): Payer: Self-pay | Admitting: Radiology

## 2015-10-06 ENCOUNTER — Observation Stay (HOSPITAL_COMMUNITY): Payer: Medicare Other

## 2015-10-06 DIAGNOSIS — J9600 Acute respiratory failure, unspecified whether with hypoxia or hypercapnia: Secondary | ICD-10-CM

## 2015-10-06 DIAGNOSIS — E1165 Type 2 diabetes mellitus with hyperglycemia: Secondary | ICD-10-CM | POA: Diagnosis present

## 2015-10-06 DIAGNOSIS — T83518A Infection and inflammatory reaction due to other urinary catheter, initial encounter: Secondary | ICD-10-CM | POA: Diagnosis not present

## 2015-10-06 DIAGNOSIS — J9690 Respiratory failure, unspecified, unspecified whether with hypoxia or hypercapnia: Secondary | ICD-10-CM

## 2015-10-06 DIAGNOSIS — T451X5A Adverse effect of antineoplastic and immunosuppressive drugs, initial encounter: Secondary | ICD-10-CM | POA: Diagnosis present

## 2015-10-06 DIAGNOSIS — R202 Paresthesia of skin: Secondary | ICD-10-CM | POA: Diagnosis not present

## 2015-10-06 DIAGNOSIS — M47812 Spondylosis without myelopathy or radiculopathy, cervical region: Secondary | ICD-10-CM | POA: Diagnosis present

## 2015-10-06 DIAGNOSIS — R579 Shock, unspecified: Secondary | ICD-10-CM | POA: Diagnosis present

## 2015-10-06 DIAGNOSIS — J9811 Atelectasis: Secondary | ICD-10-CM | POA: Diagnosis not present

## 2015-10-06 DIAGNOSIS — Z7982 Long term (current) use of aspirin: Secondary | ICD-10-CM

## 2015-10-06 DIAGNOSIS — D62 Acute posthemorrhagic anemia: Secondary | ICD-10-CM | POA: Diagnosis not present

## 2015-10-06 DIAGNOSIS — I10 Essential (primary) hypertension: Secondary | ICD-10-CM

## 2015-10-06 DIAGNOSIS — Y95 Nosocomial condition: Secondary | ICD-10-CM | POA: Diagnosis not present

## 2015-10-06 DIAGNOSIS — Z85038 Personal history of other malignant neoplasm of large intestine: Secondary | ICD-10-CM

## 2015-10-06 DIAGNOSIS — Z978 Presence of other specified devices: Secondary | ICD-10-CM

## 2015-10-06 DIAGNOSIS — G373 Acute transverse myelitis in demyelinating disease of central nervous system: Secondary | ICD-10-CM | POA: Insufficient documentation

## 2015-10-06 DIAGNOSIS — E876 Hypokalemia: Secondary | ICD-10-CM | POA: Diagnosis not present

## 2015-10-06 DIAGNOSIS — I69354 Hemiplegia and hemiparesis following cerebral infarction affecting left non-dominant side: Secondary | ICD-10-CM

## 2015-10-06 DIAGNOSIS — H9313 Tinnitus, bilateral: Secondary | ICD-10-CM | POA: Diagnosis present

## 2015-10-06 DIAGNOSIS — R270 Ataxia, unspecified: Secondary | ICD-10-CM | POA: Diagnosis not present

## 2015-10-06 DIAGNOSIS — D6489 Other specified anemias: Secondary | ICD-10-CM | POA: Diagnosis present

## 2015-10-06 DIAGNOSIS — G610 Guillain-Barre syndrome: Secondary | ICD-10-CM | POA: Diagnosis not present

## 2015-10-06 DIAGNOSIS — G8250 Quadriplegia, unspecified: Secondary | ICD-10-CM | POA: Diagnosis present

## 2015-10-06 DIAGNOSIS — G9340 Encephalopathy, unspecified: Secondary | ICD-10-CM

## 2015-10-06 DIAGNOSIS — G629 Polyneuropathy, unspecified: Secondary | ICD-10-CM

## 2015-10-06 DIAGNOSIS — E785 Hyperlipidemia, unspecified: Secondary | ICD-10-CM | POA: Diagnosis present

## 2015-10-06 DIAGNOSIS — I6310 Cerebral infarction due to embolism of unspecified precerebral artery: Secondary | ICD-10-CM | POA: Diagnosis not present

## 2015-10-06 DIAGNOSIS — N390 Urinary tract infection, site not specified: Secondary | ICD-10-CM | POA: Diagnosis not present

## 2015-10-06 DIAGNOSIS — R4701 Aphasia: Secondary | ICD-10-CM | POA: Diagnosis present

## 2015-10-06 DIAGNOSIS — G6289 Other specified polyneuropathies: Secondary | ICD-10-CM

## 2015-10-06 DIAGNOSIS — Z9221 Personal history of antineoplastic chemotherapy: Secondary | ICD-10-CM

## 2015-10-06 DIAGNOSIS — Z9049 Acquired absence of other specified parts of digestive tract: Secondary | ICD-10-CM

## 2015-10-06 DIAGNOSIS — K920 Hematemesis: Secondary | ICD-10-CM | POA: Diagnosis not present

## 2015-10-06 DIAGNOSIS — C189 Malignant neoplasm of colon, unspecified: Secondary | ICD-10-CM | POA: Diagnosis present

## 2015-10-06 DIAGNOSIS — Y738 Miscellaneous gastroenterology and urology devices associated with adverse incidents, not elsewhere classified: Secondary | ICD-10-CM | POA: Diagnosis not present

## 2015-10-06 DIAGNOSIS — Z8541 Personal history of malignant neoplasm of cervix uteri: Secondary | ICD-10-CM

## 2015-10-06 DIAGNOSIS — Z8505 Personal history of malignant neoplasm of liver: Secondary | ICD-10-CM

## 2015-10-06 DIAGNOSIS — I119 Hypertensive heart disease without heart failure: Secondary | ICD-10-CM | POA: Diagnosis present

## 2015-10-06 DIAGNOSIS — D7589 Other specified diseases of blood and blood-forming organs: Secondary | ICD-10-CM | POA: Diagnosis not present

## 2015-10-06 DIAGNOSIS — Z4659 Encounter for fitting and adjustment of other gastrointestinal appliance and device: Secondary | ICD-10-CM

## 2015-10-06 DIAGNOSIS — J189 Pneumonia, unspecified organism: Secondary | ICD-10-CM | POA: Diagnosis not present

## 2015-10-06 DIAGNOSIS — N179 Acute kidney failure, unspecified: Secondary | ICD-10-CM | POA: Diagnosis not present

## 2015-10-06 DIAGNOSIS — E871 Hypo-osmolality and hyponatremia: Secondary | ICD-10-CM | POA: Diagnosis not present

## 2015-10-06 DIAGNOSIS — E538 Deficiency of other specified B group vitamins: Secondary | ICD-10-CM | POA: Diagnosis present

## 2015-10-06 DIAGNOSIS — T17908A Unspecified foreign body in respiratory tract, part unspecified causing other injury, initial encounter: Secondary | ICD-10-CM

## 2015-10-06 DIAGNOSIS — R042 Hemoptysis: Secondary | ICD-10-CM

## 2015-10-06 DIAGNOSIS — R29898 Other symptoms and signs involving the musculoskeletal system: Secondary | ICD-10-CM

## 2015-10-06 DIAGNOSIS — C787 Secondary malignant neoplasm of liver and intrahepatic bile duct: Secondary | ICD-10-CM

## 2015-10-06 DIAGNOSIS — I6340 Cerebral infarction due to embolism of unspecified cerebral artery: Secondary | ICD-10-CM | POA: Diagnosis present

## 2015-10-06 DIAGNOSIS — D638 Anemia in other chronic diseases classified elsewhere: Secondary | ICD-10-CM | POA: Diagnosis present

## 2015-10-06 DIAGNOSIS — M21379 Foot drop, unspecified foot: Secondary | ICD-10-CM | POA: Diagnosis not present

## 2015-10-06 DIAGNOSIS — J9601 Acute respiratory failure with hypoxia: Secondary | ICD-10-CM | POA: Diagnosis not present

## 2015-10-06 DIAGNOSIS — R339 Retention of urine, unspecified: Secondary | ICD-10-CM | POA: Diagnosis not present

## 2015-10-06 DIAGNOSIS — Z452 Encounter for adjustment and management of vascular access device: Secondary | ICD-10-CM

## 2015-10-06 HISTORY — DX: Acute embolism and thrombosis of unspecified deep veins of unspecified lower extremity: I82.409

## 2015-10-06 LAB — I-STAT CHEM 8, ED
BUN: 9 mg/dL (ref 6–20)
CHLORIDE: 101 mmol/L (ref 101–111)
Calcium, Ion: 1.1 mmol/L — ABNORMAL LOW (ref 1.13–1.30)
Creatinine, Ser: 0.6 mg/dL (ref 0.44–1.00)
GLUCOSE: 105 mg/dL — AB (ref 65–99)
HEMATOCRIT: 42 % (ref 36.0–46.0)
Hemoglobin: 14.3 g/dL (ref 12.0–15.0)
POTASSIUM: 4.2 mmol/L (ref 3.5–5.1)
Sodium: 140 mmol/L (ref 135–145)
TCO2: 25 mmol/L (ref 0–100)

## 2015-10-06 LAB — URINALYSIS, ROUTINE W REFLEX MICROSCOPIC
Bilirubin Urine: NEGATIVE
GLUCOSE, UA: NEGATIVE mg/dL
Hgb urine dipstick: NEGATIVE
Ketones, ur: NEGATIVE mg/dL
LEUKOCYTES UA: NEGATIVE
Nitrite: NEGATIVE
PROTEIN: NEGATIVE mg/dL
SPECIFIC GRAVITY, URINE: 1.009 (ref 1.005–1.030)
pH: 8 (ref 5.0–8.0)

## 2015-10-06 LAB — CBC
HEMATOCRIT: 36.7 % (ref 36.0–46.0)
Hemoglobin: 12 g/dL (ref 12.0–15.0)
MCH: 29.3 pg (ref 26.0–34.0)
MCHC: 32.7 g/dL (ref 30.0–36.0)
MCV: 89.5 fL (ref 78.0–100.0)
PLATELETS: 332 10*3/uL (ref 150–400)
RBC: 4.1 MIL/uL (ref 3.87–5.11)
RDW: 13.9 % (ref 11.5–15.5)
WBC: 7.5 10*3/uL (ref 4.0–10.5)

## 2015-10-06 LAB — COMPREHENSIVE METABOLIC PANEL
ALT: 36 U/L (ref 14–54)
ANION GAP: 10 (ref 5–15)
AST: 31 U/L (ref 15–41)
Albumin: 3.1 g/dL — ABNORMAL LOW (ref 3.5–5.0)
Alkaline Phosphatase: 46 U/L (ref 38–126)
BILIRUBIN TOTAL: 0.2 mg/dL — AB (ref 0.3–1.2)
BUN: 8 mg/dL (ref 6–20)
CO2: 25 mmol/L (ref 22–32)
Calcium: 9.1 mg/dL (ref 8.9–10.3)
Chloride: 104 mmol/L (ref 101–111)
Creatinine, Ser: 0.62 mg/dL (ref 0.44–1.00)
Glucose, Bld: 109 mg/dL — ABNORMAL HIGH (ref 65–99)
POTASSIUM: 4.2 mmol/L (ref 3.5–5.1)
Sodium: 139 mmol/L (ref 135–145)
TOTAL PROTEIN: 6.7 g/dL (ref 6.5–8.1)

## 2015-10-06 LAB — RAPID URINE DRUG SCREEN, HOSP PERFORMED
Amphetamines: NOT DETECTED
BARBITURATES: NOT DETECTED
Benzodiazepines: NOT DETECTED
COCAINE: NOT DETECTED
Opiates: NOT DETECTED
Tetrahydrocannabinol: NOT DETECTED

## 2015-10-06 LAB — VITAMIN B12: VITAMIN B 12: 457 pg/mL (ref 180–914)

## 2015-10-06 LAB — I-STAT TROPONIN, ED: TROPONIN I, POC: 0.01 ng/mL (ref 0.00–0.08)

## 2015-10-06 LAB — APTT: APTT: 28 s (ref 24–37)

## 2015-10-06 LAB — PROTIME-INR
INR: 1.19 (ref 0.00–1.49)
Prothrombin Time: 15.3 seconds — ABNORMAL HIGH (ref 11.6–15.2)

## 2015-10-06 LAB — DIFFERENTIAL
BASOS ABS: 0 10*3/uL (ref 0.0–0.1)
BASOS PCT: 0 %
EOS ABS: 0.2 10*3/uL (ref 0.0–0.7)
EOS PCT: 2 %
Lymphocytes Relative: 13 %
Lymphs Abs: 1 10*3/uL (ref 0.7–4.0)
MONOS PCT: 7 %
Monocytes Absolute: 0.5 10*3/uL (ref 0.1–1.0)
NEUTROS ABS: 5.8 10*3/uL (ref 1.7–7.7)
NEUTROS PCT: 78 %

## 2015-10-06 LAB — ETHANOL

## 2015-10-06 MED ORDER — ONDANSETRON HCL 4 MG/2ML IJ SOLN
4.0000 mg | Freq: Four times a day (QID) | INTRAMUSCULAR | Status: DC | PRN
Start: 2015-10-06 — End: 2015-10-11
  Administered 2015-10-06 – 2015-10-08 (×5): 4 mg via INTRAVENOUS
  Filled 2015-10-06 (×5): qty 2

## 2015-10-06 MED ORDER — MORPHINE SULFATE (PF) 2 MG/ML IV SOLN
0.5000 mg | INTRAVENOUS | Status: DC | PRN
  Administered 2015-10-06 – 2015-10-07 (×6): 0.5 mg via INTRAVENOUS
  Filled 2015-10-06 (×6): qty 1

## 2015-10-06 MED ORDER — STROKE: EARLY STAGES OF RECOVERY BOOK
Freq: Once | Status: DC
  Filled 2015-10-06: qty 1

## 2015-10-06 MED ORDER — ACETAMINOPHEN 500 MG PO TABS
1000.0000 mg | ORAL_TABLET | Freq: Three times a day (TID) | ORAL | Status: DC | PRN
  Administered 2015-10-06: 1000 mg via ORAL
  Filled 2015-10-06 (×3): qty 2

## 2015-10-06 MED ORDER — ASPIRIN EC 81 MG PO TBEC: 81.0000 mg | DELAYED_RELEASE_TABLET | Freq: Every morning | ORAL | Status: DC

## 2015-10-06 MED ORDER — SENNOSIDES-DOCUSATE SODIUM 8.6-50 MG PO TABS: 1.0000 | ORAL_TABLET | Freq: Every evening | ORAL | Status: DC | PRN

## 2015-10-06 MED ORDER — ATORVASTATIN CALCIUM 40 MG PO TABS
40.0000 mg | ORAL_TABLET | Freq: Every day | ORAL | Status: DC
  Administered 2015-10-06: 40 mg via ORAL
  Filled 2015-10-06: qty 1

## 2015-10-06 MED ORDER — METOPROLOL SUCCINATE ER 25 MG PO TB24
37.5000 mg | ORAL_TABLET | Freq: Every evening | ORAL | Status: DC
  Administered 2015-10-06 – 2015-10-08 (×3): 37.5 mg via ORAL
  Filled 2015-10-06 (×3): qty 2

## 2015-10-06 MED ORDER — ENOXAPARIN SODIUM 40 MG/0.4ML ~~LOC~~ SOLN
40.0000 mg | SUBCUTANEOUS | Status: DC
  Administered 2015-10-06 – 2015-10-08 (×3): 40 mg via SUBCUTANEOUS
  Filled 2015-10-06 (×3): qty 0.4

## 2015-10-06 NOTE — ED Notes (Signed)
Paged Linna Darner to [place], RN

## 2015-10-06 NOTE — ED Notes (Signed)
PER [facility], Pt from home for increased bilateral weakness to legs since 0100 and tingling to right side of body. Hx of Stroke in May 2016 with left side tingling and weakness deficit. No speech difficulty, no droop, grips equal. 20g to RAC.

## 2015-10-06 NOTE — ED Provider Notes (Signed)
MSE was initiated and I personally evaluated the patient and placed orders (if any) at  6:18 AM on October 06, 2015.  The patient appears stable so that the remainder of the MSE may be completed by another provider.   Pt is a 80 y.o. female with history of hypertension, hyperlipidemia, previous stroke affecting her left side who presents to the emergency department with tingling in the right arm and right leg that are new for her that started at 1 AM. States she called EMS out because of this tingling in her right arm around 1 AM but refused transport. Later she developed bilateral lower extremity weakness sometime this morning which is new for her and she called EMS back and agreed to transport. States she has been unable to ambulate because of this weakness. Patient seen by Dr. Nicole Kindred with neurology who is canceled a code stroke. Hemodynamically stable. Labs, head CT pending. I feel she can be followed by oncoming provider. She is stable. Airway intact. Vital signs unremarkable.  [place], DO 10/06/15 [phone]

## 2015-10-06 NOTE — H&P (Signed)
[facility] History and Physical  Cynthia [name] [id number] DOB: 22-Dec-1935 DOA: 10/06/2015  Referring physician: Emergency Department PCP: Cynthia Coma, MD   CHIEF COMPLAINT:    Numbness of both hands and feet               HPI: Cynthia [name] is a 80 y.o. female with metastatic colon cancer, status post right colectomy and chemotherapy 2015. In December patient was found to have liver lesions, biopsy confirmed metastatic colon cancer. She underwent ablation of the liver lesions on the tenth of this month. Patient presented to the emergency department today with complaints of tingling in her right arm and leg which started last night. Hands and feet feel like they are asleep or "cold". She was very dizzy last night before bedtime and noticed a colorful floater in her left eye. This am patient was unable to standing secondary to poor balance and weakness of her lower extremities. Patient had a stroke a few years ago. She was left with some left-sided weakness but normally walks around independently. Patient has not started any new medications lately other than extra strength Tylenol. She gives a history of B12 deficiency.   Patient has been running temps near 100 at home, told by Radiation Onc to expect this.          ED COURSE:           Labs:   Troponin normal. Glucose 105, normal renal function. CBC normal. UDS negative, alcohol level less than 5  CXR:    Right lower lobe atelectasis and small effusion         Head CT without acute findings  EKG:    Sinus rhythm Anterolateral infarct, age indeterminate Baseline wander in lead(s) II III aVF V2 V3 V4 No significant change since last tracing Confirmed by WARD, DO, KRISTEN [id number]) on 10/06/2015 6:34:42 AM Also confirmed by WARD, DO, KRISTEN [id number]), editor [place] CCT, BEVERLY ([id number]) on 10/06/2015 8:23:29 AM                  Medications - No data to display   Review of Systems  Constitutional: Positive  for fever.  HENT: Negative.   Eyes: Negative.   Respiratory: Negative.   Cardiovascular: Negative.   Gastrointestinal: Negative.   Genitourinary: Negative.   Musculoskeletal: Negative.   Skin: Negative.   Neurological: Positive for dizziness and tingling.  Endo/Heme/Allergies: Negative.   Psychiatric/Behavioral: Negative.     Past Medical History  Diagnosis Date  . Anemia   . Blood transfusion without reported diagnosis jan 2015  . Heart murmur   . Hypertension     no bp meds   . Liver cyst   . C. difficile colitis 03/05/14/03/22/14/04/05/14    recurrent  . Stroke ([place]) 12/2014    NUMBNESS ON LEFT SIDE  . Cervical cancer ([place]) 1972  . Colon cancer ([place]) JAN 2015  . Colon cancer ([place])   . Cancer ([place])     liver  . Complication of anesthesia     slow to awaken in past, DONE WELL RECENTLY  . Tinnitus of both ears ALL THE TIME   Past Surgical History  Procedure Laterality Date  . Back surgery  1981    lower lumbar  . Tubal ligation  1968  . Laparoscopic right colectomy Right 10/22/2013    Procedure: LAPAROSCOPIC RIGHT COLECTOMY ;  Surgeon: Cynthia Hector, MD;  Location: [facility];  Service: General;  Laterality: Right;  . Salpingoophorectomy  10/22/2013  Procedure: BILATERALSALPINGO OOPHORECTOMY;  Surgeon: Cynthia Lora. [name], MD;  Location: [facility];  Service: Gynecology;;  . Abdominal hysterectomy      vaginal- partial    SOCIAL HISTORY:  reports that she has never smoked. She has never used smokeless tobacco. She reports that she does not drink alcohol or use illicit drugs. Lives:   At home with daughter     Assistive devices:   None needed for ambulation.   No Known Allergies  Family History  Problem Relation Age of Onset  . Diabetes Sister   . Cancer Sister     leukemia  . Diabetes Brother   . Heart failure Brother   . Hypertension Brother   . Breast cancer Maternal Aunt   . Rheum arthritis Mother   . Heart failure Father   . Cancer Maternal Grandmother     GIST    . Heart failure Maternal Grandfather   . Aneurysm Paternal Grandmother     Prior to Admission medications   Medication Sig Start Date End Date Taking? Authorizing Provider  acetaminophen (TYLENOL) 500 MG tablet Take 500 mg by mouth every 6 (six) hours as needed for mild pain.   Yes Historical Provider, MD  aspirin EC 81 MG tablet Take 1 tablet (81 mg total) by mouth daily. Patient taking differently: Take 81 mg by mouth every morning.  01/13/15  Yes Ashly M Gottschalk, DO  atorvastatin (LIPITOR) 40 MG tablet Take 1 tablet (40 mg total) by mouth daily. 01/13/15  Yes Ashly M Gottschalk, DO  metoprolol succinate (TOPROL-XL) 25 MG 24 hr tablet Take 37.5 mg by mouth every evening.  12/20/14  Yes Historical Provider, MD  Multiple Vitamin (MULTIVITAMIN WITH MINERALS) TABS tablet Take 1 tablet by mouth every morning.    Yes Historical Provider, MD   PHYSICAL EXAM: Filed Vitals:   10/06/15 1030 10/06/15 1100 10/06/15 1115 10/06/15 1130  BP: 172/91 151/95 161/89 152/84  Pulse: 68 71 66 65  Temp:      TempSrc:      Resp:      Height:      Weight:      SpO2: 96% 95% 96% 97%    Wt Readings from Last 3 Encounters:  10/06/15 62.097 kg (136 lb 14.4 oz)  09/25/15 64.184 kg (141 lb 8 oz)  09/21/15 63.322 kg (139 lb 9.6 oz)    General:  Pleasant white female. Appears calm and comfortable Eyes: PER, normal lids, irises & conjunctiva ENT: grossly normal hearing, lips & tongue Neck: no LAD, no masses Cardiovascular: RRR, no murmurs. No LE edema.  Respiratory: Respirations even and unlabored. Normal respiratory effort. Lungs CTA bilaterally, no wheezes / rales .   Abdomen: soft, non-distended, non-tender, active bowel sounds. No obvious masses.  Skin: no rash seen on limited exam Musculoskeletal: grossly normal tone BUE/BLE Psychiatric: grossly normal mood and affect, speech fluent and appropriate Neurologic: grossly non-focal.         LABS ON ADMISSION:    Basic Metabolic Panel:  Recent  Labs Lab 10/06/15 0612 10/06/15 0620  NA 139 140  K 4.2 4.2  CL 104 101  CO2 25  --   GLUCOSE 109* 105*  BUN 8 9  CREATININE 0.62 0.60  CALCIUM 9.1  --    Liver Function Tests:  Recent Labs Lab 10/06/15 0612  AST 31  ALT 36  ALKPHOS 46  BILITOT 0.2*  PROT 6.7  ALBUMIN 3.1*    CBC:  Recent Labs Lab 10/06/15 0612 10/06/15  [id number]  WBC 7.5  --   NEUTROABS 5.8  --   HGB 12.0 14.3  HCT 36.7 42.0  MCV 89.5  --   PLT 332  --     CREATININE: 0.6 (10/06/15 0620) Estimated creatinine clearance - 51 mL/min  Radiological Exams on Admission: Dg Chest 2 View  10/06/2015  CLINICAL DATA:  Shortness of breath, dizziness EXAM: CHEST  2 VIEW COMPARISON:  02/22/2014 FINDINGS: Linear atelectasis or scarring in the right lower lung. Small right pleural effusion. Left lung is clear. Heart is normal size. No acute bony abnormality. IMPRESSION: Right lower lung atelectasis with small right effusion. Electronically Signed   By: Rolm Baptise M.D.   On: 10/06/2015 09:19   Ct Head Wo Contrast  10/06/2015  CLINICAL DATA:  Acute onset of right-sided tingling and bilateral weakness. Initial encounter. EXAM: CT HEAD WITHOUT CONTRAST TECHNIQUE: Contiguous axial images were obtained from the base of the skull through the vertex without intravenous contrast. COMPARISON:  CT of the head performed 01/12/2015, and MRI/MRA of the brain performed 01/11/2015 FINDINGS: There is no evidence of acute infarction, mass lesion, or intra- or extra-axial hemorrhage on CT. Mild periventricular white matter change likely reflects small vessel ischemic microangiopathy. Prominence of the ventricles and sulci suggest mild cortical volume loss. The posterior fossa, including the cerebellum, brainstem and fourth ventricle, is within normal limits. The third and lateral ventricles, and basal ganglia are unremarkable in appearance. The cerebral hemispheres demonstrate grossly normal gray-white differentiation. No mass effect or  midline shift is seen. There is no evidence of fracture; visualized osseous structures are unremarkable in appearance. The visualized portions of the orbits are within normal limits. The paranasal sinuses and mastoid air cells are well-aerated. No significant soft tissue abnormalities are seen. IMPRESSION: 1. No acute intracranial pathology seen on CT. 2. Mild cortical volume loss and scattered small vessel ischemic microangiopathy. Electronically Signed   By: Garald Balding M.D.   On: 10/06/2015 06:32     ASSESSMENT / PLAN   Paresthesia of both hands and both feet accompanied by loss of balance and lower extremity weakness.   Acute onset. Started last night after an episode of dizziness. Etiology unclear. No acute findings on head CT -Admit to Observation - Tele bed -Neurology evaluation in progress.  -B12, thiamine levels -TIA / Stroke orders utilized including MRI / MRA head , carotid studies, echocardiogram, fasting lipids,    Hgb A1c, Swallow screen by RN. Can eat if passes. PT, OT evaluation.  -Will defer any additional workup to Neuro  Colon cancer metastasized to liver. Patient is s/p colon resection followed by chemo. Found in Dec 2016 to have liver lesions, s/p ablation earlier this month.  Hypertension. BP mildly elevated in ED.  -hold home toprol for permissive HTN until ruled out for CVA  Hyperlipidemia.  Continue home statin  Hx of CVA per patient, no old infarcts seen on imaging    CONSULTANTS:    Neurology  Code Status: Partial code, DNI DVT Prophylaxis: Lovenox  Family Communication:  Patient alert, oriented and understands plan of care.  Disposition Plan: Discharge to home in 24-48 hours   Time spent: 60 minutes Tye Savoy  NP [facility] Pager [phone]

## 2015-10-06 NOTE — ED Notes (Signed)
Juliann Pulse- [phone]

## 2015-10-06 NOTE — Consult Note (Addendum)
NEURO HOSPITALIST CONSULT NOTE   Requestig physician: Dr. Wallace Going, NP   Reason for Consult: Gait ataxia, numbness and paresthesias.  HPI:                                                                                                                                          Cynthia [name] is an 80 y.o. female with metastatic colon cancer s/p surgery and chemotherapy, who presents to the ED today with complaints of right arm and leg paresthesia as well as numbness of hands and feet. Was dizzy the night before admission and also noticed a colorful "floater" in her left eye. New symptoms on awakening included difficulty standing with poor balance and bilateral lower extremity weakness. She has a prior history of stroke with residual left sided weakness. At baseline she walks independently. She endorses a history of B12 deficiency. She has also been having low grade fevers at home.    Past Medical History  Diagnosis Date  . Anemia   . Blood transfusion without reported diagnosis jan 2015  . Heart murmur   . Hypertension     no bp meds   . Liver cyst   . C. difficile colitis 03/05/14/03/22/14/04/05/14    recurrent  . Stroke ([place]) 12/2014    NUMBNESS ON LEFT SIDE  . Cervical cancer ([place]) 1972  . Colon cancer ([place]) JAN 2015  . Colon cancer ([place])   . Cancer ([place])     liver  . Complication of anesthesia     slow to awaken in past, DONE WELL RECENTLY  . Tinnitus of both ears ALL THE TIME    Past Surgical History  Procedure Laterality Date  . Back surgery  1981    lower lumbar  . Tubal ligation  1968  . Laparoscopic right colectomy Right 10/22/2013    Procedure: LAPAROSCOPIC RIGHT COLECTOMY ;  Surgeon: [name], MD;  [facility];  Service: General;  Laterality: Right;  . Salpingoophorectomy  10/22/2013    Procedure: [place] Saa;  Surgeon: [name]. [name], MD;  Location: [facility];  Service: Gynecology;;  . Abdominal hysterectomy       vaginal- partial    Family History  Problem Relation Age of Onset  . Diabetes Sister   . Cancer Sister     leukemia  . Diabetes Brother   . Heart failure Brother   . Hypertension Brother   . Breast cancer Maternal Aunt   . Rheum arthritis Mother   . Heart failure Father   . Cancer Maternal Grandmother     GIST  . Heart failure Maternal Grandfather   . Aneurysm Paternal Grandmother     Social History:  reports that she has never smoked. She has never used smokeless tobacco. She reports  that she does not drink alcohol or use illicit drugs.  No Known Allergies Prior to Admission:  Prescriptions prior to admission  Medication Sig Dispense Refill Last Dose  . acetaminophen (TYLENOL) 500 MG tablet Take 500 mg by mouth every 6 (six) hours as needed for mild pain.   10/04/2015  . aspirin EC 81 MG tablet Take 1 tablet (81 mg total) by mouth daily. (Patient taking differently: Take 81 mg by mouth every morning. ) 90 tablet 0 10/05/2015 at Unknown time  . atorvastatin (LIPITOR) 40 MG tablet Take 1 tablet (40 mg total) by mouth daily. 90 tablet 0 10/05/2015 at Unknown time  . metoprolol succinate (TOPROL-XL) 25 MG 24 hr tablet Take 37.5 mg by mouth every evening.    10/05/2015 at 1800  . Multiple Vitamin (MULTIVITAMIN WITH MINERALS) TABS tablet Take 1 tablet by mouth every morning.    10/05/2015 at Unknown time   MEDICATIONS:                                                                                                                        ROS:                                                                                                                                       History obtained from   General ROS: negative for - chills, fatigue, fever, night sweats, weight gain or weight loss Psychological ROS: negative for - behavioral disorder, hallucinations, memory difficulties, mood swings or suicidal ideation Ophthalmic ROS: negative for - blurry vision, double vision, eye pain or  loss of vision ENT ROS: negative for - epistaxis, nasal discharge, oral lesions, sore throat, tinnitus or vertigo Allergy and Immunology ROS: negative for - hives or itchy/watery eyes Hematological and Lymphatic ROS: negative for - bleeding problems, bruising or swollen lymph nodes Endocrine ROS: negative for - galactorrhea, hair pattern changes, polydipsia/polyuria or temperature intolerance Respiratory ROS: negative for - cough, hemoptysis, shortness of breath or wheezing Cardiovascular ROS: negative for - chest pain, dyspnea on exertion, edema or irregular heartbeat Gastrointestinal ROS: negative for - abdominal pain, diarrhea, hematemesis, nausea/vomiting or stool incontinence Genito-Urinary ROS: negative for - dysuria, hematuria, incontinence or urinary frequency/urgency Musculoskeletal ROS: negative for - joint swelling or muscular weakness Neurological ROS: as noted in HPI Dermatological ROS: negative for rash and skin lesion changes   Blood pressure 152/84, pulse 65, temperature  97.7 F (36.5 C), temperature source Oral, resp. rate 12, height 5\' 2"  (1.575 m), weight 62.097 kg (136 lb 14.4 oz), SpO2 97 %.   Neurologic Examination:                                                                                                      HEENT-  Normocephalic, no lesions, without obvious abnormality.  Normal external eye and conjunctiva.  Normal TM's bilaterally.  Normal auditory canals and external ears. Normal external nose, mucus membranes and septum.  Normal pharynx.  Neurological Examination Mental Status: Alert, oriented, thought content appropriate.  Speech fluent without evidence of aphasia.  Able to follow 3 step commands without difficulty. Cranial Nerves: II: Visual fields grossly normal, pupils equal, round, reactive to light and accommodation III,IV, VI: ptosis not present, extra-ocular motions intact bilaterally V: Decreased sensation to left face. VII: No facial droop VIII:  hearing intact to conversation IX,X: No hypophonia or hoarseness noted. XI: bilateral shoulder shrug symmetric XII: midline tongue extension Motor: Right : 4/5 strength x 4 without asymmetry.  Sensory: Severe loss of vibration sensation and proprioception to toes and ankles. Moderately decreased vibration sensation at knees. Decreased temperature sensation to right leg sparing the foot. Decreased temperature left forearm. Deep Tendon Reflexes: 1+ bilateral brachioradialis. 0 at biceps, patellae and achilles bilaterally.  Plantars: Equivocal.  Cerebellar: Dysmetria with left finger to nose.  Gait: Knees buckle with attempted ambulation.     Lab Results: Basic Metabolic Panel:  Recent Labs Lab 10/06/15 0612 10/06/15 0620  NA 139 140  K 4.2 4.2  CL 104 101  CO2 25  --   GLUCOSE 109* 105*  BUN 8 9  CREATININE 0.62 0.60  CALCIUM 9.1  --     Liver Function Tests:  Recent Labs Lab 10/06/15 0612  AST 31  ALT 36  ALKPHOS 46  BILITOT 0.2*  PROT 6.7  ALBUMIN 3.1*   No results for input(s): LIPASE, AMYLASE in the last 168 hours. No results for input(s): AMMONIA in the last 168 hours.  CBC:  Recent Labs Lab 10/06/15 0612 10/06/15 0620  WBC 7.5  --   NEUTROABS 5.8  --   HGB 12.0 14.3  HCT 36.7 42.0  MCV 89.5  --   PLT 332  --     Cardiac Enzymes: No results for input(s): CKTOTAL, CKMB, CKMBINDEX, TROPONINI in the last 168 hours.  Lipid Panel: No results for input(s): CHOL, TRIG, HDL, CHOLHDL, VLDL, LDLCALC in the last 168 hours.  CBG: No results for input(s): GLUCAP in the last 168 hours.  Microbiology: Results for orders placed or performed in visit on 03/26/14  Clostridium Difficile by PCR     Status: Abnormal   Collection Time: 03/26/14  2:26 PM  Result Value Ref Range Status   Toxigenic C Difficile by pcr Positive (AA) Negative Final    Comment:  This assay detects the presence of Clostridium difficile DNA codingfor toxin B (tcdB) by real-time polymerase  chain reaction (PCR)amplification.This test was developed and its performance characteristics have beendetermined by [employer].  Performance characteristics referto the analytical performance of the test. This test has not beencleared or approved by the [place] Food and Drug Administration. The FDAhas determined that such clearance or approval is not necessary. Thislaboratory is  certified under the [facility] as qualified to perform high complexity clinicallaboratory testing.CALLED B. MISENHEIMER MT 16:45 03/26/14 ([email or web address])    Coagulation Studies:  Recent Labs  10/06/15 0612  LABPROT 15.3*  INR 1.19    Imaging: Dg Chest 2 View  10/06/2015  CLINICAL DATA:  Shortness of breath, dizziness EXAM: CHEST  2 VIEW COMPARISON:  02/22/2014 FINDINGS: Linear atelectasis or scarring in the right lower lung. Small right pleural effusion. Left lung is clear. Heart is normal size. No acute bony abnormality. IMPRESSION: Right lower lung atelectasis with small right effusion. Electronically Signed   By: Rolm Baptise M.D.   On: 10/06/2015 09:19   Ct Head Wo Contrast  10/06/2015  CLINICAL DATA:  Acute onset of right-sided tingling and bilateral weakness. Initial encounter. EXAM: CT HEAD WITHOUT CONTRAST TECHNIQUE: Contiguous axial images were obtained from the base of the skull through the vertex without intravenous contrast. COMPARISON:  CT of the head performed 01/12/2015, and MRI/MRA of the brain performed 01/11/2015 FINDINGS: There is no evidence of acute infarction, mass lesion, or intra- or extra-axial hemorrhage on CT. Mild periventricular white matter change likely reflects small vessel ischemic microangiopathy. Prominence of the ventricles and sulci suggest mild cortical volume loss. The posterior fossa, including the cerebellum, brainstem and fourth ventricle, is within normal limits. The third and lateral ventricles, and basal ganglia are unremarkable in  appearance. The cerebral hemispheres demonstrate grossly normal gray-white differentiation. No mass effect or midline shift is seen. There is no evidence of fracture; visualized osseous structures are unremarkable in appearance. The visualized portions of the orbits are within normal limits. The paranasal sinuses and mastoid air cells are well-aerated. No significant soft tissue abnormalities are seen. IMPRESSION: 1. No acute intracranial pathology seen on CT. 2. Mild cortical volume loss and scattered small vessel ischemic microangiopathy. Electronically Signed   By: Garald Balding M.D.   On: 10/06/2015 06:32   Assessment: 1. Acute onset of gait ataxia with dizziness, nausea, numbness and tingling of feet. Exam in ED reveals no lateralized motor/sensory deficit or ataxia.  MRI reveals no acute infarction. DDx includes small cerebellar TIA, atypical presentation of Guillain-Barre syndrome and acute thiamine deficiency.  2. Severely impaired proprioception and vibration sensation of lower extremities. Not likely to be due to chronic B12 deficiency as B12 level was normal. May be a length-dependent axonal neuropathy secondary to chemotherapy. GBS possible, but absent reflexes more likely secondary to chemotherapy.   Recommendations: 1. HgbA1c, fasting lipid panel 2. MRA brain 3. PT consult, OT consult, Speech consult 4. Echocardiogram 5. Carotid dopplers 6. Continue home ASA and atorvastatin for now. If results of work up are most consistent with TIA, then consider escalation of antiplatelet therapy.   7. Risk factor modification 8. Telemetry monitoring 9. B12 level.  10. Thiamine level.  11. MRI of cervical spine to assess for possible dorsal column lesion.  12. If above work up is unrevealing, may need outpatient EMG/NCS to further assess whether the distal neuropathy is axonal, demyelinating or both. Also to assess if there is a NMJ defect since paraneoplastic syndrome is possible given her  diagnosis of colon cancer. Also may need lumbar puncture if GBS is felt to be higher on DDx after stroke work up, or if she develops  dyspnea.    Kerney Elbe, MD 10/06/2015, 12:12 PM

## 2015-10-06 NOTE — Progress Notes (Signed)
Pt with Nausea and vomiting, stating pain 5/10 in both hands and both feet.  Pt states her hands and feet feel cold, painful, numb, and tingling.  MD notified, IV zofran and IV morphine given to pt (See MAR).  Will continue to monitor.   Fredrich Romans, RN

## 2015-10-06 NOTE — ED Provider Notes (Signed)
CSN: [id number]     Arrival date & time 10/06/15  0609 History   First MD Initiated Contact with Patient 10/06/15 [phone]     Chief Complaint  Patient presents with  . Tingling     (Consider location/radiation/quality/duration/timing/severity/associated sxs/prior Treatment) HPI   Patient is a 80 year old female with history of metastatic colon cancer, HTN, CVA May 2016, she presents to the ER with complaints of tingling and pain in bilateral hands and feet that began at 1am.  She describes it as a tightening, and it feels like "socks are squeezing both feet and hurts."  Since her stroke in May of last year she states that she has only had numbness in her left hand and her left leg, she now has that sensation bilaterally.  She also states that at 1 AM this morning she had vertigo whenever she attempted to stand up she felt that she is going to lose her balance. It is not reproduced with sitting up or rolling over. Her family members who live with her at the bedside and they deny any slurred speech, facial droop, altered mental status, weakness or fall.  The only other symptom patient reports has some generalized weakness since surgery on February 10 which worsened yesterday when she went to the store. She became very dyspneic with minimal activity. She denies chest pain, palpitations, cough, wheeze, lower extremity edema, neck pain, back pain, headache, orthopnea, PND.  Since patient had her procedure 11 days ago she has intermittent subjective low-grade fevers with TMax 100.    1:47 PM Pt has clarified timeline, she first noticed feeling off balance last night at approximately 8:30 PM. She sat down and watch some TV and then when she attempts to get up to go to bed between 9:30 and 10 PM she had the strength to get up and had a hold onto things be able to walk through her home. At 1 AM she had laid in bed and noticed bilateral numbness which was new. She was unable to go to sleep so she called her  daughter who attempted to assist her out of bed however she was unable to stand up at all. 911 was called however the patient refused to be transported. Later 911 was called again and she arrived to the emergency room at approximately 6:30 AM.   Past Medical History  Diagnosis Date  . Anemia   . Blood transfusion without reported diagnosis jan 2015  . Heart murmur   . Hypertension     no bp meds   . Liver cyst   . C. difficile colitis 03/05/14/03/22/14/04/05/14    recurrent  . Stroke ([place]) 12/2014    NUMBNESS ON LEFT SIDE  . Cervical cancer ([place]) 1972  . Colon cancer ([place]) JAN 2015  . Colon cancer ([place])   . Cancer ([place])     liver  . Complication of anesthesia     slow to awaken in past, DONE WELL RECENTLY  . Tinnitus of both ears ALL THE TIME   Past Surgical History  Procedure Laterality Date  . Back surgery  1981    lower lumbar  . Tubal ligation  1968  . Laparoscopic right colectomy Right 10/22/2013    Procedure: LAPAROSCOPIC RIGHT COLECTOMY ;  Surgeon: [name], MD;  Location: [facility];  Service: General;  Laterality: Right;  . Salpingoophorectomy  10/22/2013    Procedure: [place] Saa;  Surgeon: [name]. [name], MD;  Location: [facility];  Service: Gynecology;;  . Abdominal  hysterectomy      vaginal- partial   Family History  Problem Relation Age of Onset  . Diabetes Sister   . Cancer Sister     leukemia  . Diabetes Brother   . Heart failure Brother   . Hypertension Brother   . Breast cancer Maternal Aunt   . Rheum arthritis Mother   . Heart failure Father   . Cancer Maternal Grandmother     GIST  . Heart failure Maternal Grandfather   . Aneurysm Paternal Grandmother    Social History  Substance Use Topics  . Smoking status: Never Smoker   . Smokeless tobacco: Never Used  . Alcohol Use: No   OB History    No data available     Review of Systems  All other systems reviewed and are negative.     Allergies  Review of patient's  allergies indicates no known allergies.  Home Medications   Prior to Admission medications   Medication Sig Start Date End Date Taking? Authorizing Provider  acetaminophen (TYLENOL) 500 MG tablet Take 500 mg by mouth every 6 (six) hours as needed for mild pain.   Yes Historical Provider, MD  aspirin EC 81 MG tablet Take 1 tablet (81 mg total) by mouth daily. Patient taking differently: Take 81 mg by mouth every morning.  01/13/15  Yes Ashly M Gottschalk, DO  atorvastatin (LIPITOR) 40 MG tablet Take 1 tablet (40 mg total) by mouth daily. 01/13/15  Yes Ashly M Gottschalk, DO  metoprolol succinate (TOPROL-XL) 25 MG 24 hr tablet Take 37.5 mg by mouth every evening.  12/20/14  Yes Historical Provider, MD  Multiple Vitamin (MULTIVITAMIN WITH MINERALS) TABS tablet Take 1 tablet by mouth every morning.    Yes Historical Provider, MD   BP 161/84 mmHg  Pulse 57  Temp(Src) 97.7 F (36.5 C) (Oral)  Resp 12  Ht 5\' 2"  (1.575 m)  Wt 62.097 kg  BMI 25.03 kg/m2  SpO2 94% Physical Exam  Constitutional: She is oriented to person, place, and time. She appears well-developed and well-nourished. No distress.  Elderly female, appears stated age, nontoxic in appearance  HENT:  Head: Normocephalic and atraumatic.  Nose: Nose normal.  Mouth/Throat: Oropharynx is clear and moist. No oropharyngeal exudate.  Bilateral TM's normal in appearance  Eyes: Conjunctivae and EOM are normal. Pupils are equal, round, and reactive to light. Right eye exhibits no discharge. Left eye exhibits no discharge. No scleral icterus.  Neck: Normal range of motion. No JVD present. No tracheal deviation present. No thyromegaly present.  Cardiovascular: Normal rate, regular rhythm, normal heart sounds and intact distal pulses.  Exam reveals no gallop and no friction rub.   No murmur heard. Pulmonary/Chest: Effort normal and breath sounds normal. No respiratory distress. She has no wheezes. She has no rales. She exhibits no tenderness.   Abdominal: Soft. Bowel sounds are normal. She exhibits no distension and no mass. There is no tenderness. There is no rebound and no guarding.  Musculoskeletal: Normal range of motion. She exhibits no edema or tenderness.  Lymphadenopathy:    She has no cervical adenopathy.  Neurological: She is alert and oriented to person, place, and time. No cranial nerve deficit. She exhibits normal muscle tone. Coordination abnormal.  Speech is clear and goal oriented, follows commands Major Cranial nerves without deficit, no facial droop Normal strength in upper and lower extremities bilaterally including dorsiflexion and plantar flexion, strong and equal grip strength Sensation normal to light touch in all extremities Moves  extremities without ataxia/dysmetria Heel/shin normal Normal finger to nose No pronator drift when done from seated position Ataxic gait   Skin: Skin is warm and dry. No rash noted. She is not diaphoretic. No erythema. No pallor.  Psychiatric: She has a normal mood and affect. Her behavior is normal. Judgment and thought content normal.  Nursing note and vitals reviewed.   ED Course  Procedures (including critical care time) Labs Review Labs Reviewed  PROTIME-INR - Abnormal; Notable for the following:    Prothrombin Time 15.3 (*)    All other components within normal limits  COMPREHENSIVE METABOLIC PANEL - Abnormal; Notable for the following:    Glucose, Bld 109 (*)    Albumin 3.1 (*)    Total Bilirubin 0.2 (*)    All other components within normal limits  I-STAT CHEM 8, ED - Abnormal; Notable for the following:    Glucose, Bld 105 (*)    Calcium, Ion 1.10 (*)    All other components within normal limits  ETHANOL  APTT  CBC  DIFFERENTIAL  URINE RAPID DRUG SCREEN, HOSP PERFORMED  URINALYSIS, ROUTINE W REFLEX MICROSCOPIC (NOT AT [facility])  VITAMIN B12  VITAMIN B1  I-STAT TROPOININ, ED    Imaging Review Dg Chest 2 View  10/06/2015  CLINICAL DATA:  Shortness of  breath, dizziness EXAM: CHEST  2 VIEW COMPARISON:  02/22/2014 FINDINGS: Linear atelectasis or scarring in the right lower lung. Small right pleural effusion. Left lung is clear. Heart is normal size. No acute bony abnormality. IMPRESSION: Right lower lung atelectasis with small right effusion. Electronically Signed   By: Rolm Baptise M.D.   On: 10/06/2015 09:19   Ct Head Wo Contrast  10/06/2015  CLINICAL DATA:  Acute onset of right-sided tingling and bilateral weakness. Initial encounter. EXAM: CT HEAD WITHOUT CONTRAST TECHNIQUE: Contiguous axial images were obtained from the base of the skull through the vertex without intravenous contrast. COMPARISON:  CT of the head performed 01/12/2015, and MRI/MRA of the brain performed 01/11/2015 FINDINGS: There is no evidence of acute infarction, mass lesion, or intra- or extra-axial hemorrhage on CT. Mild periventricular white matter change likely reflects small vessel ischemic microangiopathy. Prominence of the ventricles and sulci suggest mild cortical volume loss. The posterior fossa, including the cerebellum, brainstem and fourth ventricle, is within normal limits. The third and lateral ventricles, and basal ganglia are unremarkable in appearance. The cerebral hemispheres demonstrate grossly normal gray-white differentiation. No mass effect or midline shift is seen. There is no evidence of fracture; visualized osseous structures are unremarkable in appearance. The visualized portions of the orbits are within normal limits. The paranasal sinuses and mastoid air cells are well-aerated. No significant soft tissue abnormalities are seen. IMPRESSION: 1. No acute intracranial pathology seen on CT. 2. Mild cortical volume loss and scattered small vessel ischemic microangiopathy. Electronically Signed   By: Garald Balding M.D.   On: 10/06/2015 06:32   I have personally reviewed and evaluated these images and lab results as part of my medical decision-making.   EKG  Interpretation   Date/Time:  Tuesday October 06 2015 06:30:57 EST Ventricular Rate:  76 PR Interval:  169 QRS Duration: 87 QT Interval:  394 QTC Calculation: 443 R Axis:   28 Text Interpretation:  Sinus rhythm Anterolateral infarct, age  indeterminate Baseline wander in lead(s) II III aVF V2 V3 V4 No  significant change since last tracing Confirmed by WARD,  DO, KRISTEN  [id number]) on 10/06/2015 6:34:42 AM Also confirmed by WARD,  DO,  KRISTEN  [phone]), editor [place]  CCT, [place] ([id number])  on 10/06/2015 8:23:29 AM      MDM   Possible CVA - pt presented with vague sx, including tingling, numbness, "squeezing pain" to bilateral hands and feet, with unclear timeline Work up for stroke initiated, code stroke cleared initially by Dr. Leonides Schanz.  In ER gurney pt appears to have non-focal neuro exam, normal strength, sensation and coordination with finger nose/heel shin, CN 2-12 grossly normal, however with 2-person assist, pt has ataxic gait and unable to ambulate or stand independently.   She clarified timeline stating sx began at 8:30 pm last night and progressed until 1 am when the pt could no longer stand or walk independently.  She initially refused EMS transport to the hospital, however she later presented at 6:30 am to [facility] ED via [facility].  Pt discussed with Dr. Cheral Marker inpt neurology Spoke with Nevin Bloodgood from [facility] for admission to tele (Dr. Marily Memos), for further work up and tx  MRI brain was ordered  Final diagnoses:  Ataxia  Other polyneuropathy ([place])       Delsa Grana, PA-C 10/06/15 1347  Pattricia Boss, MD 10/06/15 [phone]

## 2015-10-07 ENCOUNTER — Ambulatory Visit (HOSPITAL_BASED_OUTPATIENT_CLINIC_OR_DEPARTMENT_OTHER): Payer: Medicare Other

## 2015-10-07 ENCOUNTER — Observation Stay (HOSPITAL_COMMUNITY): Payer: Medicare Other

## 2015-10-07 DIAGNOSIS — C787 Secondary malignant neoplasm of liver and intrahepatic bile duct: Secondary | ICD-10-CM | POA: Diagnosis not present

## 2015-10-07 DIAGNOSIS — R579 Shock, unspecified: Secondary | ICD-10-CM | POA: Diagnosis not present

## 2015-10-07 DIAGNOSIS — G622 Polyneuropathy due to other toxic agents: Secondary | ICD-10-CM

## 2015-10-07 DIAGNOSIS — R202 Paresthesia of skin: Secondary | ICD-10-CM | POA: Diagnosis not present

## 2015-10-07 DIAGNOSIS — R270 Ataxia, unspecified: Secondary | ICD-10-CM | POA: Diagnosis not present

## 2015-10-07 DIAGNOSIS — G459 Transient cerebral ischemic attack, unspecified: Secondary | ICD-10-CM | POA: Diagnosis not present

## 2015-10-07 DIAGNOSIS — C189 Malignant neoplasm of colon, unspecified: Secondary | ICD-10-CM | POA: Diagnosis not present

## 2015-10-07 LAB — LIPID PANEL
CHOL/HDL RATIO: 3.8 ratio
CHOLESTEROL: 149 mg/dL (ref 0–200)
HDL: 39 mg/dL — ABNORMAL LOW (ref >40)
LDL Cholesterol: 99 mg/dL (ref 0–99)
TRIGLYCERIDES: 56 mg/dL (ref <150)
VLDL: 11 mg/dL (ref 0–40)

## 2015-10-07 LAB — COMPREHENSIVE METABOLIC PANEL
ALT: 32 U/L (ref 14–54)
AST: 30 U/L (ref 15–41)
Albumin: 3.1 g/dL — ABNORMAL LOW (ref 3.5–5.0)
Alkaline Phosphatase: 50 U/L (ref 38–126)
Anion gap: 14 (ref 5–15)
BUN: 13 mg/dL (ref 6–20)
CHLORIDE: 99 mmol/L — AB (ref 101–111)
CO2: 25 mmol/L (ref 22–32)
Calcium: 9.7 mg/dL (ref 8.9–10.3)
Creatinine, Ser: 0.65 mg/dL (ref 0.44–1.00)
Glucose, Bld: 150 mg/dL — ABNORMAL HIGH (ref 65–99)
POTASSIUM: 4 mmol/L (ref 3.5–5.1)
Sodium: 138 mmol/L (ref 135–145)
Total Bilirubin: 0.5 mg/dL (ref 0.3–1.2)
Total Protein: 7.4 g/dL (ref 6.5–8.1)

## 2015-10-07 LAB — CK: CK TOTAL: 81 U/L (ref 38–234)

## 2015-10-07 MED ORDER — ATORVASTATIN CALCIUM 80 MG PO TABS: 80.0000 mg | ORAL_TABLET | Freq: Every day | ORAL | Status: DC

## 2015-10-07 MED ORDER — MORPHINE SULFATE (PF) 2 MG/ML IV SOLN
1.0000 mg | INTRAVENOUS | Status: DC | PRN
  Administered 2015-10-08 (×6): 1 mg via INTRAVENOUS
  Filled 2015-10-07 (×7): qty 1

## 2015-10-07 MED ORDER — GABAPENTIN 100 MG PO CAPS
100.0000 mg | ORAL_CAPSULE | Freq: Three times a day (TID) | ORAL | Status: DC
  Administered 2015-10-07 – 2015-10-10 (×11): 100 mg via ORAL
  Filled 2015-10-07 (×11): qty 1

## 2015-10-07 MED ORDER — CLOPIDOGREL BISULFATE 75 MG PO TABS
75.0000 mg | ORAL_TABLET | Freq: Every day | ORAL | Status: DC
  Administered 2015-10-07 – 2015-10-09 (×3): 75 mg via ORAL
  Filled 2015-10-07 (×3): qty 1

## 2015-10-07 MED ORDER — MORPHINE SULFATE (PF) 2 MG/ML IV SOLN
1.0000 mg | INTRAVENOUS | Status: AC
  Administered 2015-10-07: 1 mg via INTRAVENOUS

## 2015-10-07 MED ORDER — DEXTROSE-NACL 5-0.9 % IV SOLN
INTRAVENOUS | Status: DC
  Administered 2015-10-07: 13:00:00 via INTRAVENOUS

## 2015-10-07 MED ORDER — ALPRAZOLAM 0.5 MG PO TABS
0.5000 mg | ORAL_TABLET | Freq: Once | ORAL | Status: AC
  Administered 2015-10-07: 0.5 mg via ORAL
  Filled 2015-10-07: qty 1

## 2015-10-07 MED ORDER — DEXTROSE-NACL 5-0.9 % IV SOLN
INTRAVENOUS | Status: DC
  Filled 2015-10-07: qty 1000

## 2015-10-07 NOTE — Progress Notes (Signed)
STROKE TEAM PROGRESS NOTE   HISTORY OF PRESENT ILLNESS Cynthia [name] is an 80 y.o. female with metastatic colon cancer s/p surgery and chemotherapy, who presents to the ED today with complaints of right arm and leg paresthesia as well as numbness of hands and feet. Was dizzy the night before admission and also noticed a colorful "floater" in her left eye. New symptoms on awakening included difficulty standing with poor balance and bilateral lower extremity weakness. She has a prior history of stroke with residual left sided weakness. At baseline she walks independently. She endorses a history of B12 deficiency. She has also been having low grade fevers at home.     SUBJECTIVE (INTERVAL HISTORY) Family at bedside. Generally weak. No headache. She feel lightheaded. Very tired. Getting more tired. General  neurology consulted.   OBJECTIVE Temp:  [97.5 F (36.4 C)-98 F (36.7 C)] 97.9 F (36.6 C) (02/22 0600) Pulse Rate:  [50-100] 71 (02/22 0600) Cardiac Rhythm:  [-] Sinus bradycardia (02/22 0700) Resp:  [16-20] 16 (02/22 0600) BP: (117-187)/(77-99) 163/92 mmHg (02/22 0600) SpO2:  [91 %-100 %] 97 % (02/22 0600)  CBC:  Recent Labs Lab 10/06/15 0612 10/06/15 0620  WBC 7.5  --   NEUTROABS 5.8  --   HGB 12.0 14.3  HCT 36.7 42.0  MCV 89.5  --   PLT 332  --     Basic Metabolic Panel:  Recent Labs Lab 10/06/15 0612 10/06/15 0620  NA 139 140  K 4.2 4.2  CL 104 101  CO2 25  --   GLUCOSE 109* 105*  BUN 8 9  CREATININE 0.62 0.60  CALCIUM 9.1  --     Lipid Panel:    Component Value Date/Time   CHOL 149 10/07/2015 0555   TRIG 56 10/07/2015 0555   HDL 39* 10/07/2015 0555   CHOLHDL 3.8 10/07/2015 0555   VLDL 11 10/07/2015 0555   LDLCALC 99 10/07/2015 0555   HgbA1c:  Lab Results  Component Value Date   HGBA1C 5.8* 01/12/2015   Urine Drug Screen:    Component Value Date/Time   LABOPIA NONE DETECTED 10/06/2015 0733   COCAINSCRNUR NONE DETECTED 10/06/2015 0733    LABBENZ NONE DETECTED 10/06/2015 0733   AMPHETMU NONE DETECTED 10/06/2015 0733   THCU NONE DETECTED 10/06/2015 0733   LABBARB NONE DETECTED 10/06/2015 0733      IMAGING  Dg Chest 2 View 10/06/2015   Right lower lung atelectasis with small right effusion.   Ct Head Wo Contrast 10/06/2015   1. No acute intracranial pathology seen on CT.  2. Mild cortical volume loss and scattered small vessel ischemic microangiopathy.    Mr Brain Wo Contrast 10/06/2015   Atrophy and chronic microvascular ischemia No acute abnormality.      PHYSICAL EXAM  Physical exam: Exam: Gen: NAD    CV: RRR, no SEM  Neuro: Detailed Neurologic Exam  Speech:    Speech is normal; fluent and spontaneous with normal comprehension.  Cognition:    The patient is oriented to person, place, and day, date, month;    Cranial Nerves:    The pupils are equal, round, and reactive to light. Attempted funduscopic exam could not visualize. Visual fields are full to finger confrontation. Extraocular movements are intact. Trigeminal sensation is intact and the muscles of mastication are normal. The face is symmetric. The palate elevates in the midline. Hearing intact. Voice is normal. Shoulder shrug is normal. The tongue has normal motion without fasciculations.   Coordination:  finger to nose difficulty but not out of proportion to weakness  Motor Observation:    No asymmetry, no atrophy, and no involuntary movements noted. Tone:    Normal muscle tone.      Strength:    Generalized weakness 3/5-3+/5     Reflex Exam:  DTR's:    Globally absent except trace bracioradialis Toes:    The toes are downgoing bilaterally.   Clonus:    Clonus is absent.     ASSESSMENT/PLAN Cynthia [name] is a 80 y.o. female with history of metastatic colon cancer status post surgery and chemotherapy, previous stroke with residual left-sided weakness, B-12 deficiency, anemia, and hypertension, presenting with right  arm and leg paresthesias, poor balance, and bilateral lower extremity weakness.  She did not receive IV t-PA due to minimal deficits.  Patient's progression not consistent with stroke or TIA. General neurology consulted.   MRI  No acute abnormality.   MRA  pending  Carotid Doppler pending  2D Echo pending  LDL 99  HgbA1c pending  VTE prophylaxis - Lovenox  Diet Heart Room service appropriate?: Yes; Fluid consistency:: Thin  aspirin 81 mg daily prior to admission, now on aspirin 81 mg daily   Will change to Plavix  Patient counseled to be compliant with her antithrombotic medications  Ongoing aggressive stroke risk factor management  Therapy recommendations:  Pending  Disposition: Pending  Hypertension  Blood pressure running mildly high   Hyperlipidemia  Home meds:  Lipitor 40 mg daily resumed in hospital  LDL 99, goal < 70  Increase Lipitor to 80 mg daily  Continue statin at discharge    Other Stroke Risk Factors  Advanced age  Hx stroke/TIA   Other Active Problems  Metastatic colon cancer  Hospital day #   Mikey Bussing PA-C [facility] Pager [phone] 10/07/2015, 9:31 AM  Patient's progression not consistent with stroke or TIA. General neurology consulted. Instruct him a sign off at this time.   Personally examined patient and images, and have participated in and made any corrections needed to history, physical, neuro exam,assessment and plan as stated above.  I have personally obtained the history, evaluated lab date, reviewed imaging studies and agree with radiology interpretations.    Sarina Ill, MD Stroke Neurology [phone] [facility]     To contact Stroke Continuity provider, please refer to [email or web address] After hours, contact General Neurology with pressure running mildly high

## 2015-10-07 NOTE — Care Management Note (Signed)
Case Management Note  Patient Details  Name: Cynthia [name] MRN: [id number] Date of Birth: [birth year]  Subjective/Objective:                    Action/Plan: Patient presented with Numbness of both hands and feetLives at home with children. Will follow for discharge needs pending PT/OT evals and physician orders.  Expected Discharge Date:                  Expected Discharge Plan:     In-House Referral:     Discharge planning Services     Post Acute Care Choice:    Choice offered to:     DME Arranged:    DME Agency:     HH Arranged:    HH Agency:     Status of Service:  In process, will continue to follow  Medicare Important Message Given:    Date Medicare IM Given:    Medicare IM give by:    Date Additional Medicare IM Given:    Additional Medicare Important Message give by:     If discussed at [place] of Stay Meetings, dates discussed:    Additional Comments:  Rolm Baptise, RN 10/07/2015, 11:36 AM [phone]

## 2015-10-07 NOTE — Progress Notes (Signed)
OT Cancellation Note  Patient Details Name: Cynthia [name] MRN: [id number] DOB: [birth year]   Cancelled Treatment:    Reason Eval/Treat Not Completed: Pain limiting ability to participate (Pt observed to be grimicing and writhing in bed.) Pt reports pain (pins and needles) throughout UEs and LEs; worst in RLE. Will follow up tomorrow for OT eval as time allows.   Binnie Kand M.S., OTR/L Pager: [phone]  10/07/2015, 4:05 PM

## 2015-10-07 NOTE — Progress Notes (Addendum)
Inpatient Rehabilitation  Patient was screened by Gunnar Fusi for appropriateness for an Inpatient Acute Rehab consult.  At this time we are recommending an Inpatient Rehab consult if patient is changed to inpatient status and if we have a more definitive rehab diagnosis.  Notified RN CM.  Please call with questions.    Carmelia Roller., CCC/SLP Admission Coordinator  [place]  Cell [phone]

## 2015-10-07 NOTE — Evaluation (Signed)
Physical Therapy Evaluation Patient Details Name: Cynthia [name] MRN: [id number] DOB: April 04, 1936 Today's Date: 10/07/2015   History of Present Illness  Cynthia [name] is a 80 y.o. female with metastatic colon cancer, status post right colectomy and chemotherapy 2015. In December patient was found to have liver lesions, biopsy confirmed metastatic colon cancer. She underwent ablation of the liver lesions on the tenth of this month. Presented with numbness and tingling and weakness in LE's.  Was unable to ambulate and normally ambulates independent.  Clinical Impression  Patient presents with decreased independence with mobility due to deficits listed in PT problem list.  She will benefit from skilled PT in the acute setting to allow return home with family support following CIR level rehab stay.  Spoke with MD regarding presentation and possible need for spinal imaging.      Follow Up Recommendations CIR    Equipment Recommendations  Wheelchair (measurements PT);Wheelchair cushion (measurements PT)    Recommendations for Other Services Rehab consult     Precautions / Restrictions Precautions Precautions: Fall      Mobility  Bed Mobility Overal bed mobility: Needs Assistance Bed Mobility: Rolling;Sidelying to Sit;Sit to Supine Rolling: Mod assist Sidelying to sit: Max assist   Sit to supine: Mod assist   General bed mobility comments: rolling with use of rail and cues assist for lower body; side to sit, assist for legs off bed and to lift trunk upright; assist to supine cues for managing upper body with hand placement and assist for legs into bed  Transfers Overall transfer level: Needs assistance   Transfers: Lateral/Scoot Transfers          Lateral/Scoot Transfers: Max assist General transfer comment: cues and assist for anterior weight shift, blocking knees, and scooting hips along edge of bed toward head of bed several scoots to achieve  positioning  Ambulation/Gait                Stairs            Wheelchair Mobility    Modified Rankin (Stroke Patients Only)       Balance Overall balance assessment: Needs assistance Sitting-balance support: Bilateral upper extremity supported;Feet supported Sitting balance-Leahy Scale: Poor Sitting balance - Comments: maintains with supervision with UE support on edge of bed x 1 minute                                     Pertinent Vitals/Pain Pain Assessment: 0-10 Pain Score: 6  Pain Location: All over, mostly hands and feet Pain Descriptors / Indicators: Aching;Tingling;Numbness Pain Intervention(s): Monitored during session    Home Living Family/patient expects to be discharged to:: Private residence Living Arrangements: Children Available Help at Discharge: Family;Available PRN/intermittently Type of Home: House Home Access: Stairs to enter   Entrance Stairs-Number of Steps: 1 Home Layout: One level Home Equipment: Walker - 2 wheels;Walker - standard;Cane - quad;Crutches;Bedside commode;Shower seat;Grab bars - tub/shower      Prior Function Level of Independence: Independent               Hand Dominance   Dominant Hand: Right    Extremity/Trunk Assessment   Upper Extremity Assessment: LUE deficits/detail;RUE deficits/detail RUE Deficits / Details: AAROM WFL, strength shoulder flexion 2+/5, elbow flexion 3+/5, elbow extension 3-/5; numbness and tingling fingers to shoulders   RUE Sensation: decreased light touch LUE Deficits / Details: AROM WFL, strength shoulder flexion  3/5, elbow flexion 4-/5, elbow extension 3/5, numbness and tingling fingers to shoulders   Lower Extremity Assessment: RLE deficits/detail;LLE deficits/detail RLE Deficits / Details: AAROM WFL, strength hip flexion 2-/5, knee extension 3/5, ankle DF 2/5, numbness and tingling feet to knees LLE Deficits / Details: AAROM WFL, strength hip flexion 2-/5, knee  extension 3-/5, ankle DF 2-/5,  numbness and tingling feet to knees     Communication   Communication: No difficulties  Cognition Arousal/Alertness: Awake/alert Behavior During Therapy: WFL for tasks assessed/performed Overall Cognitive Status: Within Functional Limits for tasks assessed                      General Comments General comments (skin integrity, edema, etc.): reports previous history of stress ncontinence, says she can still tell when she is wet    Exercises General Exercises - Lower Extremity Ankle Circles/Pumps: AAROM;Both;10 reps;Supine Heel Slides: AAROM;Both;5 reps;Supine      Assessment/Plan    PT Assessment Patient needs continued PT services  PT Diagnosis Generalized weakness;Other (comment) (quadraparesis)   PT Problem List Decreased strength;Impaired sensation;Decreased mobility;Decreased balance;Decreased activity tolerance;Decreased safety awareness;Decreased knowledge of use of DME  PT Treatment Interventions DME instruction;Balance training;Functional mobility training;Patient/family education;Gait training;Therapeutic activities;Therapeutic exercise;Wheelchair mobility training   PT Goals (Current goals can be found in the Care Plan section) Acute Rehab PT Goals Patient Stated Goal: To walk PT Goal Formulation: With patient Time For Goal Achievement: 10/21/15 Potential to Achieve Goals: Good    Frequency Min 3X/week   Barriers to discharge Decreased caregiver support daughter works 2-3 half days, per week    Co-evaluation               End of Session Equipment Utilized During Treatment: Gait belt Activity Tolerance: Patient limited by fatigue;Other (comment) (nausea) Patient left: in bed;with bed alarm set;with call bell/phone within reach      Functional Assessment Tool Used: Clinical Judgement Functional Limitation: Mobility: Walking and moving around Mobility: Walking and Moving Around Current Status [id number]): At least 60  percent but less than 80 percent impaired, limited or restricted Mobility: Walking and Moving Around Goal Status [id number]): At least 40 percent but less than 60 percent impaired, limited or restricted    Time: [id number] PT Time Calculation (min) (ACUTE ONLY): 30 min   Charges:   PT Evaluation $PT Eval High Complexity: 1 Procedure PT Treatments $Therapeutic Activity: 8-22 mins   PT G Codes:   PT G-Codes **NOT FOR INPATIENT CLASS** Functional Assessment Tool Used: Clinical Judgement Functional Limitation: Mobility: Walking and moving around Mobility: Walking and Moving Around Current Status [id number]): At least 60 percent but less than 80 percent impaired, limited or restricted Mobility: Walking and Moving Around Goal Status [phone]): At least 40 percent but less than 60 percent impaired, limited or restricted    Reginia Naas 10/07/2015, 12:02 PM  Magda Kiel, [place] 10/07/2015

## 2015-10-07 NOTE — Progress Notes (Signed)
[facility] PROGRESS NOTE  Cynthia [name] [id number] DOB: Oct 10, 1935 DOA: 10/06/2015 PCP: [name], MD  Assessment/Plan: Paresthesia of both hands and both feet accompanied by loss of balance and lower extremity weakness. Acute onset. Started last night after an episode of dizziness. Etiology unclear. No acute findings on head CT -Neurology evaluation in progress.  -B12 level is 457 -TIA / Stroke orders utilized including MRI / MRA head , carotid studies, echocardiogram, fasting lipids. Will start gabapentin 100 g by mouth 3 times a day  Colon cancer metastasized to liver. Patient is s/p colon resection followed by chemo. Found in Dec 2016 to have liver lesions, s/p ablation earlier this month.  Hypertension. BP mildly elevated in ED. MRI head is negative for stroke We'll continue with home medication Toprol-XL 37.5 mg by mouth daily  Hyperlipidemia.  Continue home statin  Hx of CVA per patient, no old infarcts seen on imaging   Code Status: Partial code, DO NOT INTUBATE Family Communication: Discussed with patient's daughter at bedside Disposition Plan: Pending workup of generalized weakness, numbness   Consultants:  *Neurology  Procedures:  Carotid Dopplers  Antibiotics:  None  HPI/Subjective: 80 y.o. female with metastatic colon cancer, status post right colectomy and chemotherapy 2015. In December patient was found to have liver lesions, biopsy confirmed metastatic colon cancer. She underwent ablation of the liver lesions on the tenth of this month. Patient presented to the emergency department today with complaints of tingling in her right arm and leg which started last night. Hands and feet feel like they are asleep or "cold". She was very dizzy last night before bedtime and noticed a colorful floater in her left eye. This am patient was unable to standing secondary to poor balance and weakness of her lower extremities. Patient had a stroke a  few years ago. She was left with some left-sided weakness but normally walks around independently. Patient has not started any new medications lately other than extra strength Tylenol. She gives a history of B12 deficiency.  Patient continues to complain of numbness and tingling of upper and lower extremities.   Objective: Filed Vitals:   10/07/15 1020 10/07/15 1442  BP: 160/88 181/91  Pulse: 78 62  Temp: 98.9 F (37.2 C) 97.6 F (36.4 C)  Resp: 18 18   No intake or output data in the 24 hours ending 10/07/15 1624 Filed Weights   10/06/15 0628  Weight: 62.097 kg (136 lb 14.4 oz)    Exam:   General:  Appears in no acute distress  Cardiovascular: S1-S2 regular  Respiratory: Clear to auscultation bilaterally  Abdomen: Soft, nontender, no organomegaly  Musculoskeletal: No cyanosis/clubbing/edema of the lower extremities   Data Reviewed: Basic Metabolic Panel:  Recent Labs Lab 10/06/15 0612 10/06/15 0620 10/07/15 1041  NA 139 140 138  K 4.2 4.2 4.0  CL 104 101 99*  CO2 25  --  25  GLUCOSE 109* 105* 150*  BUN 8 9 13   CREATININE 0.62 0.60 0.65  CALCIUM 9.1  --  9.7   Liver Function Tests:  Recent Labs Lab 10/06/15 0612 10/07/15 1041  AST 31 30  ALT 36 32  ALKPHOS 46 50  BILITOT 0.2* 0.5  PROT 6.7 7.4  ALBUMIN 3.1* 3.1*   CBC:  Recent Labs Lab 10/06/15 0612 10/06/15 0620  WBC 7.5  --   NEUTROABS 5.8  --   HGB 12.0 14.3  HCT 36.7 42.0  MCV 89.5  --   PLT 332  --  Cardiac Enzymes:  Recent Labs Lab 10/07/15 1041  CKTOTAL 81     Studies: Dg Chest 2 View  10/06/2015  CLINICAL DATA:  Shortness of breath, dizziness EXAM: CHEST  2 VIEW COMPARISON:  02/22/2014 FINDINGS: Linear atelectasis or scarring in the right lower lung. Small right pleural effusion. Left lung is clear. Heart is normal size. No acute bony abnormality. IMPRESSION: Right lower lung atelectasis with small right effusion. Electronically Signed   By: Rolm Baptise M.D.   On:  10/06/2015 09:19   Ct Head Wo Contrast  10/06/2015  CLINICAL DATA:  Acute onset of right-sided tingling and bilateral weakness. Initial encounter. EXAM: CT HEAD WITHOUT CONTRAST TECHNIQUE: Contiguous axial images were obtained from the base of the skull through the vertex without intravenous contrast. COMPARISON:  CT of the head performed 01/12/2015, and MRI/MRA of the brain performed 01/11/2015 FINDINGS: There is no evidence of acute infarction, mass lesion, or intra- or extra-axial hemorrhage on CT. Mild periventricular white matter change likely reflects small vessel ischemic microangiopathy. Prominence of the ventricles and sulci suggest mild cortical volume loss. The posterior fossa, including the cerebellum, brainstem and fourth ventricle, is within normal limits. The third and lateral ventricles, and basal ganglia are unremarkable in appearance. The cerebral hemispheres demonstrate grossly normal gray-white differentiation. No mass effect or midline shift is seen. There is no evidence of fracture; visualized osseous structures are unremarkable in appearance. The visualized portions of the orbits are within normal limits. The paranasal sinuses and mastoid air cells are well-aerated. No significant soft tissue abnormalities are seen. IMPRESSION: 1. No acute intracranial pathology seen on CT. 2. Mild cortical volume loss and scattered small vessel ischemic microangiopathy. Electronically Signed   By: Garald Balding M.D.   On: 10/06/2015 06:32   Mr Brain Wo Contrast  10/06/2015  CLINICAL DATA:  Ataxia. EXAM: MRI HEAD WITHOUT CONTRAST TECHNIQUE: Multiplanar, multiecho pulse sequences of the brain and surrounding structures were obtained without intravenous contrast. COMPARISON:  CT head 10/06/2015 FINDINGS: Negative for acute infarct. Mild to moderate chronic microvascular ischemic change in the white matter. Chronic ischemia in the basal ganglia and thalamus bilaterally. Mild chronic ischemia in the pons.  Age-appropriate atrophy.  Negative for hydrocephalus Negative for intracranial hemorrhage.  Negative for mass or edema Paranasal sinuses clear.  Pituitary not enlarged.  Normal skullbase. IMPRESSION: Atrophy and chronic microvascular ischemia No acute abnormality. Electronically Signed   By: Franchot Gallo M.D.   On: 10/06/2015 14:01   Mr Jodene Nam Head/brain Wo Cm  10/07/2015  CLINICAL DATA:  Gait ataxia, numbness, and paresthesias. EXAM: MRA HEAD WITHOUT CONTRAST TECHNIQUE: Angiographic images of the Circle of Willis were obtained using MRA technique without intravenous contrast. COMPARISON:  01/11/2015 FINDINGS: The study is mildly motion degraded. The visualized distal vertebral arteries are patent with the right being dominant. PICA, AICA, and SCA origins are patent. Basilar artery is patent without stenosis. Posterior communicating arteries are not identified. PCAs are patent without evidence of significant proximal stenosis, although there is right greater than left left PCA branch vessel irregularity and attenuation. This is less prominent than on the prior study. The distal left cervical ICA is tortuous. Intracranial ICAs are patent without evidence of significant stenosis. ACAs and MCAs are patent without evidence of significant proximal stenosis or major branch occlusion. Moderate anterior circulation branch vessel irregularity is again seen. No intracranial aneurysm is identified. IMPRESSION: 1. No evidence of large vessel occlusion or significant proximal stenosis. 2. Moderate branch vessel irregularity suggestive of atherosclerosis. Electronically  Signed   By: Logan Bores M.D.   On: 10/07/2015 11:32    Scheduled Meds: .  stroke: mapping our early stages of recovery book   Does not apply Once  . ALPRAZolam  0.5 mg Oral Once  . clopidogrel  75 mg Oral Daily  . enoxaparin (LOVENOX) injection  40 mg Subcutaneous Q24H  . metoprolol succinate  37.5 mg Oral QPM   Continuous Infusions: . dextrose 5 %  and 0.9% NaCl 75 mL/hr at 10/07/15 1309    Active Problems:   Colon cancer metastasized to liver [facility])   Peripheral neuropathy (HCC)   Paresthesia of both hands   Paresthesia of both feet   Cerebral embolism with cerebral infarction    Time spent: 25 min    [facility] Pager [phone]. If 7PM-7AM, please contact night-coverage at [email or web address], password [facility] 10/07/2015, 4:24 PM

## 2015-10-07 NOTE — Progress Notes (Signed)
*  PRELIMINARY RESULTS* Vascular Ultrasound Carotid Duplex (Doppler) has been completed.   There is no obvious evidence of hemodynamically significant internal carotid artery stenosis bilaterally. Vertebral arteries are patent with antegrade flow.  10/07/2015 12:21 PM Maudry Mayhew, RVT, RDCS, RDMS

## 2015-10-08 ENCOUNTER — Observation Stay (HOSPITAL_COMMUNITY): Payer: Medicare Other

## 2015-10-08 ENCOUNTER — Ambulatory Visit (HOSPITAL_COMMUNITY): Payer: Medicare Other

## 2015-10-08 DIAGNOSIS — C787 Secondary malignant neoplasm of liver and intrahepatic bile duct: Secondary | ICD-10-CM | POA: Diagnosis not present

## 2015-10-08 DIAGNOSIS — G622 Polyneuropathy due to other toxic agents: Secondary | ICD-10-CM | POA: Diagnosis not present

## 2015-10-08 DIAGNOSIS — C189 Malignant neoplasm of colon, unspecified: Secondary | ICD-10-CM | POA: Diagnosis not present

## 2015-10-08 DIAGNOSIS — R202 Paresthesia of skin: Secondary | ICD-10-CM | POA: Diagnosis not present

## 2015-10-08 LAB — HEMOGLOBIN A1C
Hgb A1c MFr Bld: 5.9 % — ABNORMAL HIGH (ref 4.8–5.6)
Mean Plasma Glucose: 123 mg/dL

## 2015-10-08 LAB — VITAMIN B1: VITAMIN B1 (THIAMINE): 147.2 nmol/L (ref 66.5–200.0)

## 2015-10-08 LAB — MAGNESIUM: MAGNESIUM: 1.9 mg/dL (ref 1.7–2.4)

## 2015-10-08 MED ORDER — GADOBENATE DIMEGLUMINE 529 MG/ML IV SOLN
15.0000 mL | Freq: Once | INTRAVENOUS | Status: AC | PRN
  Administered 2015-10-08: 13 mL via INTRAVENOUS

## 2015-10-08 NOTE — Progress Notes (Signed)
C/o of persistent legs pain without relief from morphine. NP paged with increased dose of morphine. Continues to c/o morphine 1mg  is not effective.

## 2015-10-08 NOTE — Evaluation (Signed)
Occupational Therapy Evaluation Patient Details Name: Cynthia [name] MRN: [id number] DOB: 06/04/1936 Today's Date: 10/08/2015    History of Present Illness Cynthia [name] is a 80 y.o. female with metastatic colon cancer, status post right colectomy and chemotherapy 2015. In December patient was found to have liver lesions, biopsy confirmed metastatic colon cancer. She underwent ablation of the liver lesions on the tenth of this month. Presented with numbness and tingling and weakness in LE's.  Was unable to ambulate and normally ambulates independent.   Clinical Impression   Pt reports she was independent with ADLs and mobility PTA. Currently pt is overall min-mod assist for ADLs and bed mobility. Pt declined transfers at this time secondary to pain. Pt with episode of urinary incontinence; reports she can tell when she is wet but did not identify to therapist that she was wet during session (RN aware). RN tech assisted therapist with cleaning pt and bed change. Recommending CIR level therapies for follow up to maximize independence and safety with ADLs and functional mobility. Pt would benefit from continued skilled OT to address established goals.    Follow Up Recommendations  CIR;Supervision/Assistance - 24 hour    Equipment Recommendations  Other (comment) (TBD)    Recommendations for Other Services Rehab consult     Precautions / Restrictions Precautions Precautions: Fall Restrictions Weight Bearing Restrictions: No      Mobility Bed Mobility Overal bed mobility: Needs Assistance Bed Mobility: Rolling;Sidelying to Sit;Sit to Sidelying Rolling: Min assist Sidelying to sit: Min assist (assist for trunk and controlled descent of LEs to ground)     Sit to sidelying: Min assist (assist to bring LEs onto bed) General bed mobility comments: Heavy use of hand rail, HOB flat. VCs for hand placement and technique. Pt able to pull self to top of bed with use of rails and  knees bent min assist to hold feet.   Transfers                 General transfer comment: Transfers not attempted at this time; pt declining sencondary to pain.    Balance Overall balance assessment: Needs assistance Sitting-balance support: Bilateral upper extremity supported Sitting balance-Leahy Scale: Poor                                      ADL Overall ADL's : Needs assistance/impaired Eating/Feeding: Set up;Sitting   Grooming: Minimal assistance;Sitting   Upper Body Bathing: Minimal assitance;Sitting   Lower Body Bathing: Moderate assistance;+2 for safety/equipment;Sit to/from stand   Upper Body Dressing : Minimal assistance;Sitting Upper Body Dressing Details (indicate cue type and reason): Pt able to doff/don hospital gown at bed level. Lower Body Dressing: Moderate assistance;Sit to/from stand;+2 for safety/equipment Lower Body Dressing Details (indicate cue type and reason): Pt unable to reach feet to pull up socks secondary to pain in legs and lower back               General ADL Comments: Pts daughter present for OT eval. Pt unable to identify that she had urinated in bed and was wet. Transfers were not attempted at this time; pt reports she is in too much pain to attempt standing.     Vision     Perception     Praxis      Pertinent Vitals/Pain Pain Assessment: 0-10 Pain Score: 8  Pain Location: bilateral hands, bilateral LEs up to lower back  Pain Descriptors / Indicators: Aching;Numbness;Tightness;Pins and needles Pain Intervention(s): Limited activity within patient's tolerance;Monitored during session;Repositioned;Premedicated before session     Hand Dominance Right   Extremity/Trunk Assessment Upper Extremity Assessment Upper Extremity Assessment: Generalized weakness   Lower Extremity Assessment Lower Extremity Assessment: Defer to PT evaluation   Cervical / Trunk Assessment Cervical / Trunk Assessment: Other  exceptions Cervical / Trunk Exceptions: hx of back sx   Communication Communication Communication: No difficulties   Cognition Arousal/Alertness: Awake/alert Behavior During Therapy: Anxious Overall Cognitive Status: Within Functional Limits for tasks assessed                     General Comments       Exercises       Shoulder Instructions      Home Living Family/patient expects to be discharged to:: Unsure Living Arrangements: Children Available Help at Discharge: Family;Available 24 hours/day Type of Home: House Home Access: Stairs to enter [employer] of Steps: 1 Entrance Stairs-Rails: None Home Layout: One level     Bathroom Shower/Tub: Tub/shower unit Shower/tub characteristics: Architectural technologist: Standard Bathroom Accessibility: Yes How Accessible: Accessible via walker Home Equipment: [place] - 2 wheels;Walker - standard;Cane - quad;Crutches;Bedside commode;Shower seat;Grab bars - tub/shower          Prior Functioning/Environment Level of Independence: Independent             OT Diagnosis: Generalized weakness;Acute pain   OT Problem List: Decreased strength;Decreased range of motion;Decreased activity tolerance;Impaired balance (sitting and/or standing);Decreased safety awareness;Decreased knowledge of use of DME or AE;Decreased knowledge of precautions;Pain   OT Treatment/Interventions: Self-care/ADL training;Therapeutic exercise;Energy conservation;DME and/or AE instruction;Therapeutic activities;Patient/family education;Balance training    OT Goals(Current goals can be found in the care plan section) Acute Rehab OT Goals Patient Stated Goal: return to being independent OT Goal Formulation: With patient Time For Goal Achievement: 10/22/15 Potential to Achieve Goals: Good ADL Goals Pt Will Perform Grooming: with min assist;standing Pt Will Perform Upper Body Bathing: with supervision;sitting Pt Will Perform Lower Body  Bathing: with min assist;sit to/from stand Pt Will Transfer to Toilet: with min assist;with +2 assist;ambulating;bedside commode (over toilet) Pt Will Perform Toileting - Clothing Manipulation and hygiene: with min assist;sit to/from stand  OT Frequency: Min 2X/week   Barriers to D/C:            Co-evaluation              End of Session Nurse Communication: Mobility status;Other (comment) (pt with urinary incontinent episode; RN tech assisted)  Activity Tolerance: Patient limited by pain Patient left: in bed;with call bell/phone within reach;with nursing/sitter in room   Time: 1410-1445 OT Time Calculation (min): 35 min Charges:  OT General Charges $OT Visit: 1 Procedure OT Evaluation $OT Eval Moderate Complexity: 1 Procedure OT Treatments $Self Care/Home Management : 8-22 mins G-Codes: OT G-codes **NOT FOR INPATIENT CLASS** Functional Assessment Tool Used: Clinical judgement Functional Limitation: Self care Self Care Current Status [id number]): At least 20 percent but less than 40 percent impaired, limited or restricted Self Care Goal Status [id number]): At least 1 percent but less than 20 percent impaired, limited or restricted   Binnie Kand M.S., OTR/L Pager: [phone]  10/08/2015, 3:04 PM

## 2015-10-08 NOTE — Progress Notes (Signed)
  Echocardiogram 2D Echocardiogram has been performed.  Cynthia [name] 10/08/2015, 11:03 AM

## 2015-10-08 NOTE — Care Management Obs Status (Signed)
[place] NOTIFICATION   Patient Details  Name: Cynthia [name] MRN: [id number] Date of Birth: Jan 30, 1936   Medicare Observation Status Notification Given:  Yes    Pollie Friar, RN 10/08/2015, 1:59 PM

## 2015-10-08 NOTE — Progress Notes (Signed)
[facility] PROGRESS NOTE  Cynthia [name] [id number] DOB: [birth year] DOA: 10/06/2015 PCP: [name], MD  Assessment/Plan:  Paresthesia of both hands and both feet accompanied by loss of balance and lower extremity weakness.  Acute onset. Started last night after an episode of dizziness. Etiology unclear. No acute findings on head CT -Neurology evaluation in progress.  -B12 level is 457 - Magnesium is 1.9, CK 81 - MRI/MRA brain negative for stroke. Started on gabapentin 100 g by mouth 3 times a day  Colon cancer metastasized to liver. Patient is s/p colon resection followed by chemo. Found in Dec 2016 to have liver lesions, s/p ablation of liver lesions earlier this month.  Hypertension. BP mildly elevated in ED. MRI head is negative for stroke We'll continue with home medication Toprol-XL 37.5 mg by mouth daily  Hyperlipidemia.  Continue home statin  Hx of CVA per patient, no old infarcts seen on imaging   Code Status: Partial code, DO NOT INTUBATE Family Communication: Discussed with patient's daughter at bedside Disposition Plan: Pending workup of generalized weakness, numbness   Consultants:  *Neurology  Procedures:  Carotid Dopplers  Antibiotics:  None  HPI/Subjective: 80 y.o. female with metastatic colon cancer, status post right colectomy and chemotherapy 2015. In December patient was found to have liver lesions, biopsy confirmed metastatic colon cancer. She underwent ablation of the liver lesions on the tenth of this month. Patient presented to the emergency department today with complaints of tingling in her right arm and leg which started last night. Hands and feet feel like they are asleep or "cold". She was very dizzy last night before bedtime and noticed a colorful floater in her left eye. This am patient was unable to standing secondary to poor balance and weakness of her lower extremities. Patient had a stroke a few years ago. She  was left with some left-sided weakness but normally walks around independently. Patient has not started any new medications lately other than extra strength Tylenol. She gives a history of B12 deficiency.   Patient says that she feels better this morning. But continues to have numbness and muscle pain in lower extremities   Objective: Filed Vitals:   10/08/15 0621 10/08/15 1059  BP: 166/72 177/89  Pulse: 53 67  Temp: 97.5 F (36.4 C) 98.4 F (36.9 C)  Resp: 20 20   No intake or output data in the 24 hours ending 10/08/15 1340 Filed Weights   10/06/15 0628  Weight: 62.097 kg (136 lb 14.4 oz)    Exam:   General:  Appears in no acute distress  Cardiovascular: S1-S2 regular  Respiratory: Clear to auscultation bilaterally  Abdomen: Soft, nontender, no organomegaly  Musculoskeletal: No cyanosis/clubbing/edema of the lower extremities   Data Reviewed: Basic Metabolic Panel:  Recent Labs Lab 10/06/15 0612 10/06/15 0620 10/07/15 1041 10/08/15 0521  NA 139 140 138  --   K 4.2 4.2 4.0  --   CL 104 101 99*  --   CO2 25  --  25  --   GLUCOSE 109* 105* 150*  --   BUN 8 9 13   --   CREATININE 0.62 0.60 0.65  --   CALCIUM 9.1  --  9.7  --   MG  --   --   --  1.9   Liver Function Tests:  Recent Labs Lab 10/06/15 0612 10/07/15 1041  AST 31 30  ALT 36 32  ALKPHOS 46 50  BILITOT 0.2* 0.5  PROT 6.7 7.4  ALBUMIN  3.1* 3.1*   CBC:  Recent Labs Lab 10/06/15 0612 10/06/15 0620  WBC 7.5  --   NEUTROABS 5.8  --   HGB 12.0 14.3  HCT 36.7 42.0  MCV 89.5  --   PLT 332  --    Cardiac Enzymes:  Recent Labs Lab 10/07/15 1041  CKTOTAL 81     Studies: Mr Brain Wo Contrast  10/06/2015  CLINICAL DATA:  Ataxia. EXAM: MRI HEAD WITHOUT CONTRAST TECHNIQUE: Multiplanar, multiecho pulse sequences of the brain and surrounding structures were obtained without intravenous contrast. COMPARISON:  CT head 10/06/2015 FINDINGS: Negative for acute infarct. Mild to moderate chronic  microvascular ischemic change in the white matter. Chronic ischemia in the basal ganglia and thalamus bilaterally. Mild chronic ischemia in the pons. Age-appropriate atrophy.  Negative for hydrocephalus Negative for intracranial hemorrhage.  Negative for mass or edema Paranasal sinuses clear.  Pituitary not enlarged.  Normal skullbase. IMPRESSION: Atrophy and chronic microvascular ischemia No acute abnormality. Electronically Signed   By: Franchot Gallo M.D.   On: 10/06/2015 14:01   Mr Jodene Nam Head/brain Wo Cm  10/07/2015  CLINICAL DATA:  Gait ataxia, numbness, and paresthesias. EXAM: MRA HEAD WITHOUT CONTRAST TECHNIQUE: Angiographic images of the Circle of Willis were obtained using MRA technique without intravenous contrast. COMPARISON:  01/11/2015 FINDINGS: The study is mildly motion degraded. The visualized distal vertebral arteries are patent with the right being dominant. PICA, AICA, and SCA origins are patent. Basilar artery is patent without stenosis. Posterior communicating arteries are not identified. PCAs are patent without evidence of significant proximal stenosis, although there is right greater than left left PCA branch vessel irregularity and attenuation. This is less prominent than on the prior study. The distal left cervical ICA is tortuous. Intracranial ICAs are patent without evidence of significant stenosis. ACAs and MCAs are patent without evidence of significant proximal stenosis or major branch occlusion. Moderate anterior circulation branch vessel irregularity is again seen. No intracranial aneurysm is identified. IMPRESSION: 1. No evidence of large vessel occlusion or significant proximal stenosis. 2. Moderate branch vessel irregularity suggestive of atherosclerosis. Electronically Signed   By: Logan Bores M.D.   On: 10/07/2015 11:32    Scheduled Meds: .  stroke: mapping our early stages of recovery book   Does not apply Once  . clopidogrel  75 mg Oral Daily  . enoxaparin (LOVENOX)  injection  40 mg Subcutaneous Q24H  . gabapentin  100 mg Oral TID  . metoprolol succinate  37.5 mg Oral QPM   Continuous Infusions: . dextrose 5 % and 0.9% NaCl 75 mL/hr at 10/07/15 1309    Active Problems:   Colon cancer metastasized to liver [facility])   Peripheral neuropathy (HCC)   Paresthesia of both hands   Paresthesia of both feet   Cerebral embolism with cerebral infarction    Time spent: 25 min    [facility] Pager [phone]. If 7PM-7AM, please contact night-coverage at [email or web address], password [facility] 10/08/2015, 1:40 PM

## 2015-10-08 NOTE — Progress Notes (Signed)
OT Cancellation Note  Patient Details Name: Cynthia [name] MRN: [id number] DOB: [birth year]   Cancelled Treatment:    Reason Eval/Treat Not Completed: Patient at procedure or test/ unavailable. Will follow up for OT eval as time allows.  Binnie Kand M.S., OTR/L Pager: [phone]  10/08/2015, 10:41 AM

## 2015-10-09 ENCOUNTER — Inpatient Hospital Stay (HOSPITAL_COMMUNITY): Payer: Medicare Other

## 2015-10-09 ENCOUNTER — Observation Stay (HOSPITAL_COMMUNITY): Payer: Medicare Other

## 2015-10-09 DIAGNOSIS — Y95 Nosocomial condition: Secondary | ICD-10-CM | POA: Diagnosis not present

## 2015-10-09 DIAGNOSIS — C787 Secondary malignant neoplasm of liver and intrahepatic bile duct: Secondary | ICD-10-CM

## 2015-10-09 DIAGNOSIS — R202 Paresthesia of skin: Secondary | ICD-10-CM | POA: Diagnosis not present

## 2015-10-09 DIAGNOSIS — J9600 Acute respiratory failure, unspecified whether with hypoxia or hypercapnia: Secondary | ICD-10-CM | POA: Diagnosis not present

## 2015-10-09 DIAGNOSIS — J9811 Atelectasis: Secondary | ICD-10-CM | POA: Diagnosis not present

## 2015-10-09 DIAGNOSIS — T17998A Other foreign object in respiratory tract, part unspecified causing other injury, initial encounter: Secondary | ICD-10-CM | POA: Diagnosis not present

## 2015-10-09 DIAGNOSIS — R410 Disorientation, unspecified: Secondary | ICD-10-CM | POA: Diagnosis not present

## 2015-10-09 DIAGNOSIS — Z9049 Acquired absence of other specified parts of digestive tract: Secondary | ICD-10-CM | POA: Diagnosis not present

## 2015-10-09 DIAGNOSIS — R270 Ataxia, unspecified: Secondary | ICD-10-CM | POA: Diagnosis present

## 2015-10-09 DIAGNOSIS — T83518A Infection and inflammatory reaction due to other urinary catheter, initial encounter: Secondary | ICD-10-CM | POA: Diagnosis not present

## 2015-10-09 DIAGNOSIS — R209 Unspecified disturbances of skin sensation: Secondary | ICD-10-CM | POA: Diagnosis not present

## 2015-10-09 DIAGNOSIS — R339 Retention of urine, unspecified: Secondary | ICD-10-CM | POA: Diagnosis not present

## 2015-10-09 DIAGNOSIS — I69354 Hemiplegia and hemiparesis following cerebral infarction affecting left non-dominant side: Secondary | ICD-10-CM | POA: Diagnosis not present

## 2015-10-09 DIAGNOSIS — N179 Acute kidney failure, unspecified: Secondary | ICD-10-CM | POA: Diagnosis not present

## 2015-10-09 DIAGNOSIS — Z9221 Personal history of antineoplastic chemotherapy: Secondary | ICD-10-CM | POA: Diagnosis not present

## 2015-10-09 DIAGNOSIS — D62 Acute posthemorrhagic anemia: Secondary | ICD-10-CM | POA: Diagnosis not present

## 2015-10-09 DIAGNOSIS — C189 Malignant neoplasm of colon, unspecified: Secondary | ICD-10-CM | POA: Diagnosis not present

## 2015-10-09 DIAGNOSIS — F4323 Adjustment disorder with mixed anxiety and depressed mood: Secondary | ICD-10-CM | POA: Diagnosis not present

## 2015-10-09 DIAGNOSIS — G373 Acute transverse myelitis in demyelinating disease of central nervous system: Secondary | ICD-10-CM | POA: Insufficient documentation

## 2015-10-09 DIAGNOSIS — Z8541 Personal history of malignant neoplasm of cervix uteri: Secondary | ICD-10-CM | POA: Diagnosis not present

## 2015-10-09 DIAGNOSIS — K920 Hematemesis: Secondary | ICD-10-CM | POA: Diagnosis not present

## 2015-10-09 DIAGNOSIS — N319 Neuromuscular dysfunction of bladder, unspecified: Secondary | ICD-10-CM | POA: Diagnosis not present

## 2015-10-09 DIAGNOSIS — M21379 Foot drop, unspecified foot: Secondary | ICD-10-CM | POA: Diagnosis not present

## 2015-10-09 DIAGNOSIS — I119 Hypertensive heart disease without heart failure: Secondary | ICD-10-CM | POA: Diagnosis present

## 2015-10-09 DIAGNOSIS — Z7982 Long term (current) use of aspirin: Secondary | ICD-10-CM | POA: Diagnosis not present

## 2015-10-09 DIAGNOSIS — E1165 Type 2 diabetes mellitus with hyperglycemia: Secondary | ICD-10-CM | POA: Diagnosis present

## 2015-10-09 DIAGNOSIS — I6310 Cerebral infarction due to embolism of unspecified precerebral artery: Secondary | ICD-10-CM

## 2015-10-09 DIAGNOSIS — R042 Hemoptysis: Secondary | ICD-10-CM | POA: Diagnosis not present

## 2015-10-09 DIAGNOSIS — R630 Anorexia: Secondary | ICD-10-CM | POA: Diagnosis not present

## 2015-10-09 DIAGNOSIS — G622 Polyneuropathy due to other toxic agents: Secondary | ICD-10-CM | POA: Diagnosis not present

## 2015-10-09 DIAGNOSIS — E871 Hypo-osmolality and hyponatremia: Secondary | ICD-10-CM | POA: Diagnosis not present

## 2015-10-09 DIAGNOSIS — R4701 Aphasia: Secondary | ICD-10-CM | POA: Diagnosis present

## 2015-10-09 DIAGNOSIS — E785 Hyperlipidemia, unspecified: Secondary | ICD-10-CM | POA: Diagnosis present

## 2015-10-09 DIAGNOSIS — N390 Urinary tract infection, site not specified: Secondary | ICD-10-CM | POA: Diagnosis not present

## 2015-10-09 DIAGNOSIS — R579 Shock, unspecified: Secondary | ICD-10-CM

## 2015-10-09 DIAGNOSIS — D7589 Other specified diseases of blood and blood-forming organs: Secondary | ICD-10-CM | POA: Diagnosis not present

## 2015-10-09 DIAGNOSIS — Z8505 Personal history of malignant neoplasm of liver: Secondary | ICD-10-CM | POA: Diagnosis not present

## 2015-10-09 DIAGNOSIS — E538 Deficiency of other specified B group vitamins: Secondary | ICD-10-CM | POA: Diagnosis present

## 2015-10-09 DIAGNOSIS — T451X5A Adverse effect of antineoplastic and immunosuppressive drugs, initial encounter: Secondary | ICD-10-CM | POA: Diagnosis present

## 2015-10-09 DIAGNOSIS — G8220 Paraplegia, unspecified: Secondary | ICD-10-CM | POA: Diagnosis not present

## 2015-10-09 DIAGNOSIS — Y738 Miscellaneous gastroenterology and urology devices associated with adverse incidents, not elsewhere classified: Secondary | ICD-10-CM | POA: Diagnosis not present

## 2015-10-09 DIAGNOSIS — G610 Guillain-Barre syndrome: Secondary | ICD-10-CM | POA: Diagnosis not present

## 2015-10-09 DIAGNOSIS — D638 Anemia in other chronic diseases classified elsewhere: Secondary | ICD-10-CM | POA: Diagnosis present

## 2015-10-09 DIAGNOSIS — G8250 Quadriplegia, unspecified: Secondary | ICD-10-CM | POA: Diagnosis not present

## 2015-10-09 DIAGNOSIS — J9601 Acute respiratory failure with hypoxia: Secondary | ICD-10-CM | POA: Diagnosis not present

## 2015-10-09 DIAGNOSIS — R609 Edema, unspecified: Secondary | ICD-10-CM | POA: Diagnosis not present

## 2015-10-09 DIAGNOSIS — E876 Hypokalemia: Secondary | ICD-10-CM | POA: Diagnosis not present

## 2015-10-09 DIAGNOSIS — R4182 Altered mental status, unspecified: Secondary | ICD-10-CM | POA: Diagnosis not present

## 2015-10-09 DIAGNOSIS — D6489 Other specified anemias: Secondary | ICD-10-CM | POA: Diagnosis present

## 2015-10-09 DIAGNOSIS — I82502 Chronic embolism and thrombosis of unspecified deep veins of left lower extremity: Secondary | ICD-10-CM | POA: Diagnosis not present

## 2015-10-09 DIAGNOSIS — H9313 Tinnitus, bilateral: Secondary | ICD-10-CM | POA: Diagnosis present

## 2015-10-09 DIAGNOSIS — J189 Pneumonia, unspecified organism: Secondary | ICD-10-CM | POA: Diagnosis not present

## 2015-10-09 DIAGNOSIS — M47812 Spondylosis without myelopathy or radiculopathy, cervical region: Secondary | ICD-10-CM | POA: Diagnosis present

## 2015-10-09 DIAGNOSIS — Z85038 Personal history of other malignant neoplasm of large intestine: Secondary | ICD-10-CM | POA: Diagnosis not present

## 2015-10-09 LAB — CBC
HCT: 29.7 % — ABNORMAL LOW (ref 36.0–46.0)
HEMATOCRIT: 31.6 % — AB (ref 36.0–46.0)
Hemoglobin: 10.7 g/dL — ABNORMAL LOW (ref 12.0–15.0)
Hemoglobin: 10 g/dL — ABNORMAL LOW (ref 12.0–15.0)
MCH: 29.2 pg (ref 26.0–34.0)
MCH: 29.2 pg (ref 26.0–34.0)
MCHC: 33.9 g/dL (ref 30.0–36.0)
MCHC: 33.7 g/dL (ref 30.0–36.0)
MCV: 86.3 fL (ref 78.0–100.0)
MCV: 86.6 fL (ref 78.0–100.0)
PLATELETS: 458 10*3/uL — AB (ref 150–400)
Platelets: 525 10*3/uL — ABNORMAL HIGH (ref 150–400)
RBC: 3.66 MIL/uL — AB (ref 3.87–5.11)
RBC: 3.43 MIL/uL — AB (ref 3.87–5.11)
RDW: 13.5 % (ref 11.5–15.5)
RDW: 13.4 % (ref 11.5–15.5)
WBC: 19.2 10*3/uL — AB (ref 4.0–10.5)
WBC: 15.8 10*3/uL — AB (ref 4.0–10.5)

## 2015-10-09 LAB — COMPREHENSIVE METABOLIC PANEL
ALT: 24 U/L (ref 14–54)
AST: 36 U/L (ref 15–41)
Albumin: 2.5 g/dL — ABNORMAL LOW (ref 3.5–5.0)
Alkaline Phosphatase: 40 U/L (ref 38–126)
Anion gap: 12 (ref 5–15)
BUN: 13 mg/dL (ref 6–20)
CHLORIDE: 97 mmol/L — AB (ref 101–111)
CO2: 21 mmol/L — AB (ref 22–32)
Calcium: 8.4 mg/dL — ABNORMAL LOW (ref 8.9–10.3)
Creatinine, Ser: 1.01 mg/dL — ABNORMAL HIGH (ref 0.44–1.00)
GFR, EST NON AFRICAN AMERICAN: 52 mL/min — AB (ref >60)
Glucose, Bld: 201 mg/dL — ABNORMAL HIGH (ref 65–99)
POTASSIUM: 3.6 mmol/L (ref 3.5–5.1)
SODIUM: 130 mmol/L — AB (ref 135–145)
Total Bilirubin: 0.5 mg/dL (ref 0.3–1.2)
Total Protein: 5.8 g/dL — ABNORMAL LOW (ref 6.5–8.1)

## 2015-10-09 LAB — POCT I-STAT 3, ART BLOOD GAS (G3+)
Acid-base deficit: 6 mmol/L — ABNORMAL HIGH (ref 0.0–2.0)
Bicarbonate: 18.5 mEq/L — ABNORMAL LOW (ref 20.0–24.0)
O2 Saturation: 90 %
PH ART: 7.354 (ref 7.350–7.450)
TCO2: 20 mmol/L (ref 0–100)
pCO2 arterial: 33 mmHg — ABNORMAL LOW (ref 35.0–45.0)
pO2, Arterial: 59 mmHg — ABNORMAL LOW (ref 80.0–100.0)

## 2015-10-09 LAB — TYPE AND SCREEN
ABO/RH(D): A POS
Antibody Screen: NEGATIVE

## 2015-10-09 LAB — LACTIC ACID, PLASMA
LACTIC ACID, VENOUS: 3.9 mmol/L — AB (ref 0.5–2.0)
LACTIC ACID, VENOUS: 1.7 mmol/L (ref 0.5–2.0)

## 2015-10-09 LAB — ABO/RH: ABO/RH(D): A POS

## 2015-10-09 LAB — MRSA PCR SCREENING: MRSA by PCR: NEGATIVE

## 2015-10-09 MED ORDER — SODIUM CHLORIDE 0.9 % IV SOLN
INTRAVENOUS | Status: DC
  Administered 2015-10-09: 13:00:00 via INTRAVENOUS

## 2015-10-09 MED ORDER — CHLORHEXIDINE GLUCONATE 0.12 % MT SOLN
15.0000 mL | Freq: Two times a day (BID) | OROMUCOSAL | Status: DC
  Administered 2015-10-09: 15 mL via OROMUCOSAL

## 2015-10-09 MED ORDER — SODIUM CHLORIDE 0.9 % IV SOLN
80.0000 mg | Freq: Once | INTRAVENOUS | Status: DC
  Filled 2015-10-09: qty 80

## 2015-10-09 MED ORDER — SODIUM CHLORIDE 0.9 % IV SOLN
750.0000 mL | INTRAVENOUS | Status: DC | PRN
  Administered 2015-10-09: 750 mL via INTRAVENOUS

## 2015-10-09 MED ORDER — PANTOPRAZOLE SODIUM 40 MG IV SOLR: 40.0000 mg | Freq: Two times a day (BID) | INTRAVENOUS | Status: DC

## 2015-10-09 MED ORDER — SODIUM CHLORIDE 0.9 % IV SOLN
8.0000 mg/h | INTRAVENOUS | Status: DC
  Filled 2015-10-09 (×8): qty 80

## 2015-10-09 MED ORDER — SODIUM CHLORIDE 0.9 % IV SOLN
8.0000 mg/h | INTRAVENOUS | Status: DC
  Filled 2015-10-09 (×2): qty 80

## 2015-10-09 MED ORDER — CETYLPYRIDINIUM CHLORIDE 0.05 % MT LIQD
7.0000 mL | Freq: Two times a day (BID) | OROMUCOSAL | Status: DC
  Administered 2015-10-09: 7 mL via OROMUCOSAL

## 2015-10-09 MED ORDER — SODIUM CHLORIDE 0.9 % IV SOLN: INTRAVENOUS | Status: DC

## 2015-10-09 MED ORDER — PANTOPRAZOLE SODIUM 40 MG IV SOLR
40.0000 mg | Freq: Two times a day (BID) | INTRAVENOUS | Status: DC
  Administered 2015-10-12 – 2015-10-14 (×5): 40 mg via INTRAVENOUS
  Filled 2015-10-09 (×5): qty 40

## 2015-10-09 MED ORDER — SODIUM CHLORIDE 0.9 % IV SOLN
INTRAVENOUS | Status: DC
  Administered 2015-10-09 – 2015-10-10 (×2): via INTRAVENOUS

## 2015-10-09 MED ORDER — CETYLPYRIDINIUM CHLORIDE 0.05 % MT LIQD
7.0000 mL | Freq: Two times a day (BID) | OROMUCOSAL | Status: DC
  Administered 2015-10-09 – 2015-10-11 (×4): 7 mL via OROMUCOSAL

## 2015-10-09 NOTE — Progress Notes (Signed)
Pt states seeing things, seeing people. Pt can't fall asleep. Morphine not effective with pain management. NP made aware with no new order.

## 2015-10-09 NOTE — Progress Notes (Signed)
Critical MD in the room, patient still drowsy. Rapid response nurse called. Patient transferred to [facility].

## 2015-10-09 NOTE — Progress Notes (Signed)
[place] reported to the writer that there was no step down bed. MD notified.

## 2015-10-09 NOTE — Consult Note (Signed)
[facility] Consultation Note  Referring Provider: Dr. Merrie Roof [facility]) Primary Care Physician:  [name], MD  Reason for Consultation:  Acute upper GI tract bleed  HPI: Cynthia [name] is a 80 y.o. female with history of colon cancer with liver metastases.  Admitted to hospital for variety of neurologic complaints, currently on clopidigrel, and is undergoing neurologic evaluation.  Apparently, late this morning, patient had acute onset of worsening neurologic symptoms with some drop in systolic blood pressure and was transferred in ICU recently.  Once in ICU patient apparently had large amount of coffee ground emesis.  No reported abdominal pain.  No frankly blood emesis.  No melena or hematochezia.  Unclear whether patient has ever had an endoscopy before.   Past Medical History  Diagnosis Date  . Anemia   . Blood transfusion without reported diagnosis jan 2015  . Heart murmur   . Hypertension     no bp meds   . Liver cyst   . C. difficile colitis 03/05/14/03/22/14/04/05/14    recurrent  . Stroke ([place]) 12/2014    NUMBNESS ON LEFT SIDE  . Cervical cancer ([place]) 1972  . Colon cancer ([place]) JAN 2015  . Colon cancer ([place])   . Cancer ([place])     liver  . Complication of anesthesia     slow to awaken in past, DONE WELL RECENTLY  . Tinnitus of both ears ALL THE TIME    Past Surgical History  Procedure Laterality Date  . Back surgery  1981    lower lumbar  . Tubal ligation  1968  . Laparoscopic right colectomy Right 10/22/2013    Procedure: LAPAROSCOPIC RIGHT COLECTOMY ;  Surgeon: [name], MD;  Location: [facility] ORS;  Service: General;  Laterality: Right;  . Salpingoophorectomy  10/22/2013    Procedure: [place] Saa;  Surgeon: [name]. [name], MD;  Location: [facility] ORS;  Service: Gynecology;;  . Abdominal hysterectomy      vaginal- partial    Prior to Admission medications   Medication Sig Start Date End Date Taking? Authorizing Provider   acetaminophen (TYLENOL) 500 MG tablet Take 500 mg by mouth every 6 (six) hours as needed for mild pain.   Yes Historical Provider, MD  aspirin EC 81 MG tablet Take 1 tablet (81 mg total) by mouth daily. Patient taking differently: Take 81 mg by mouth every morning.  01/13/15  Yes Ashly M Gottschalk, DO  atorvastatin (LIPITOR) 40 MG tablet Take 1 tablet (40 mg total) by mouth daily. 01/13/15  Yes Ashly M Gottschalk, DO  metoprolol succinate (TOPROL-XL) 25 MG 24 hr tablet Take 37.5 mg by mouth every evening.  12/20/14  Yes Historical Provider, MD  Multiple Vitamin (MULTIVITAMIN WITH MINERALS) TABS tablet Take 1 tablet by mouth every morning.    Yes Historical Provider, MD    Current Facility-Administered Medications  Medication Dose Route Frequency Provider Last Rate Last Dose  .  stroke: mapping our early stages of recovery book   Does not apply Once Willia Craze, NP      . 0.9 %  sodium chloride infusion   Intravenous Continuous Raylene Miyamoto, MD      . 0.9 %  sodium chloride infusion   Intravenous Continuous Raylene Miyamoto, MD      . 0.9 %  sodium chloride infusion   Intravenous Continuous [name], NP      . acetaminophen (TYLENOL) tablet 1,000 mg  1,000 mg Oral TID PRN Waldemar Dickens,  MD   1,000 mg at 10/06/15 1659  . dextrose 5 %-0.9 % sodium chloride infusion   Intravenous Continuous Oswald Hillock, MD 75 mL/hr at 10/07/15 1309    . enoxaparin (LOVENOX) injection 40 mg  40 mg Subcutaneous Q24H Willia Craze, NP   40 mg at 10/08/15 2042  . gabapentin (NEURONTIN) capsule 100 mg  100 mg Oral TID Oswald Hillock, MD   100 mg at 10/09/15 0858  . metoprolol succinate (TOPROL-XL) 24 hr tablet 37.5 mg  37.5 mg Oral QPM Willia Craze, NP   37.5 mg at 10/08/15 1810  . morphine 2 MG/ML injection 1 mg  1 mg Intravenous Q3H PRN Gardiner Barefoot, NP   1 mg at 10/08/15 2150  . ondansetron (ZOFRAN) injection 4 mg  4 mg Intravenous Q6H PRN Gardiner Barefoot, NP   4 mg at 10/08/15  0425  . pantoprazole (PROTONIX) 80 mg in sodium chloride 0.9 % 100 mL IVPB  80 mg Intravenous Once Oswald Hillock, MD      . pantoprazole (PROTONIX) 80 mg in sodium chloride 0.9 % 250 mL (0.32 mg/mL) infusion  8 mg/hr Intravenous Continuous Oswald Hillock, MD      . Derrill Memo ON 10/12/2015] pantoprazole (PROTONIX) injection 40 mg  40 mg Intravenous Q12H Oswald Hillock, MD      . senna-docusate (Senokot-S) tablet 1 tablet  1 tablet Oral QHS PRN Willia Craze, NP        Allergies as of 10/06/2015  . (No Known Allergies)    Family History  Problem Relation Age of Onset  . Diabetes Sister   . Cancer Sister     leukemia  . Diabetes Brother   . Heart failure Brother   . Hypertension Brother   . Breast cancer Maternal Aunt   . Rheum arthritis Mother   . Heart failure Father   . Cancer Maternal Grandmother     GIST  . Heart failure Maternal Grandfather   . Aneurysm Paternal Grandmother     Social History   Social History  . Marital Status: Widowed    Spouse Name: N/A  . Number of Children: N/A  . Years of Education: N/A   Occupational History  . Not on file.   Social History Main Topics  . Smoking status: Never Smoker   . Smokeless tobacco: Never Used  . Alcohol Use: No  . Drug Use: No  . Sexual Activity: No   Other Topics Concern  . Not on file   Social History Narrative    Review of Systems: Patient somnolent, slow to answer questions  Physical Exam: Vital signs in last 24 hours: Temp:  [97.4 F (36.3 C)-98.9 F (37.2 C)] 97.8 F (36.6 C) (02/24 0943) Pulse Rate:  [53-91] 85 (02/24 1215) Resp:  [16-20] 16 (02/24 1215) BP: (65-181)/(50-94) 137/86 mmHg (02/24 1215) SpO2:  [93 %-100 %] 100 % (02/24 1215) Last BM Date: 10/06/15 General:   Alert, somnolent but arousable, difficult to answer questions Head:  Normocephalic and atraumatic. Eyes:  Sclera clear, no icterus.   Conjunctiva pink. Ears:  Normal auditory acuity. Nose:  NGT in place; 50cc of coffee ground  material, no frank blood seen.  No deformity, discharge,  or lesions. Mouth:  No deformity or lesions.  Oropharynx pink & moist. Neck:  Supple; no masses or thyromegaly. Lungs:  Clear throughout to auscultation.   No wheezes, crackles, or rhonchi. No acute distress. Heart:  Regular rate and rhythm;  no murmurs, clicks, rubs,  or gallops. Abdomen:  Soft, nontender and nondistended. Old surgical scars No masses, hepatosplenomegaly or hernias noted. Normal bowel sounds, without guarding, and without rebound.     Msk:  Symmetrical without gross deformities. Normal posture. Pulses:  Normal pulses noted. Extremities:  Without clubbing or edema. Neurologic:  Alert and  oriented x4; somnolent but arousable Skin:  Pale, scattered ecchymoses, Intact without significant lesions or rashes. Psych:  Somnolent but arousable, slow to answer questions   Lab Results:  Recent Labs  10/09/15 1030  WBC 19.2*  HGB 10.7*  HCT 31.6*  PLT 525*   BMET  Recent Labs  10/07/15 1041 10/09/15 1030  NA 138 130*  K 4.0 3.6  CL 99* 97*  CO2 25 21*  GLUCOSE 150* 201*  BUN 13 13  CREATININE 0.65 1.01*  CALCIUM 9.7 8.4*   LFT  Recent Labs  10/09/15 1030  PROT 5.8*  ALBUMIN 2.5*  AST 36  ALT 24  ALKPHOS 40  BILITOT 0.5   PT/INR No results for input(s): LABPROT, INR in the last 72 hours.  Studies/Results: Mr Cervical Spine W Wo Contrast  10/08/2015  CLINICAL DATA:  80 year old female with ataxia, loss of balance and lower extremity weakness with bilateral upper and lower extremity paresthesia, acute onset. Metastatic colon cancer. Subsequent encounter. EXAM: MRI CERVICAL SPINE WITHOUT AND WITH CONTRAST TECHNIQUE: Multiplanar and multiecho pulse sequences of the cervical spine, to include the craniocervical junction and cervicothoracic junction, were obtained according to standard protocol without and with intravenous contrast. CONTRAST:  66mL MULTIHANCE GADOBENATE DIMEGLUMINE 529 MG/ML IV SOLN  COMPARISON:  Brain MRI 10/06/2015.  PET-CT 07/28/2015 FINDINGS: Straightening of cervical lordosis. Multilevel cervical degenerative endplate changes. There is trace superior endplate marrow edema anteriorly at C7 which appears degenerative in nature. No marrow lesion in the cervical spine suspicious for metastatic disease. Visible bone marrow signal at the skullbase appears stable and within normal limits. Cervicomedullary junction is within normal limits. Negative visualized posterior fossa structures. Spinal cord signal is within normal limits at all visualized levels. No abnormal cervical intradural enhancement. Negative paraspinal soft tissues. Cervical spine degenerative changes: C2-C3: Mild to moderate facet hypertrophy greater on the right. Borderline to mild right C3 foraminal stenosis. C3-C4: Disc space loss with circumferential disc osteophyte complex eccentric to the right. Broad-based posterior component of disc. Narrowing of the ventral CSF space without spinal stenosis. Moderate to severe uncovertebral and facet hypertrophy on the right. Mild left and severe right C4 foraminal stenosis. C4-C5: Trace anterolisthesis with moderate to severe facet hypertrophy greater on the right. Mild disc bulge. No spinal stenosis. Moderate right C5 foraminal stenosis. C5-C6: Disc space loss. Circumferential disc osteophyte complex broad-based posterior component of disc. Mild ligament flavum hypertrophy. Borderline to mild spinal stenosis. Uncovertebral hypertrophy with moderate bilateral C6 foraminal stenosis. C6-C7: Disc space loss. Circumferential disc osteophyte complex. Broad-based posterior component of disc eccentric to the left. Uncovertebral hypertrophy greater on the left. Mild ligament flavum hypertrophy. Borderline to mild spinal stenosis. Severe left C7 foraminal stenosis. C7-T1: Moderate facet hypertrophy.  No stenosis. No upper thoracic spinal stenosis. IMPRESSION: 1. No metastatic disease identified in  the cervical spine. Mild degenerative appearing endplate marrow edema at C7. 2. Multilevel disc, endplate, and facet degeneration in the cervical spine. 3. Subsequent borderline to mild spinal stenosis at C5-C6 and C6-C7 with no spinal cord mass effect or signal abnormality. 4. Subsequent moderate or severe neural foraminal stenosis at the right C4, right C5, bilateral C6, and  left C7 nerve levels. Electronically Signed   By: Genevie Ann M.D.   On: 10/08/2015 17:39   Dg Chest Port 1 View  10/09/2015  CLINICAL DATA:  Hemoptysis EXAM: PORTABLE CHEST 1 VIEW COMPARISON:  10/06/2015 FINDINGS: Cardiomediastinal silhouette is stable. Elevation of the right hemidiaphragm again noted. Again noted triangular shape atelectasis in right middle lobe/ infrahilar region. There is new linear atelectasis left base medially. No segmental infiltrate or pulmonary edema. IMPRESSION: Elevation of the right hemidiaphragm again noted. Again noted triangular shape atelectasis in right middle lobe/ infrahilar region. There is new linear atelectasis left base medially. No segmental infiltrate or pulmonary edema. Electronically Signed   By: Lahoma Crocker M.D.   On: 10/09/2015 10:24   Impression:  1.  Coffee ground emesis.  There is no evidence of overt active upper GI tract bleeding.  Suspect current emesis represents esophagitis  and gastritis from clopidigrel. 2.  Anemia.  Likely multifactorial.  No overt GI tract bleeding at the present time. 3.  Metastatic colon cancer  Plan:  1.  Volume repletion as needed with IVF. 2.  PPI. 3.  Follow serial CBCs. 4.  No need for any type of endoscopic evaluation at the present time. 5.  [facility] will follow.   LOS: 0 days   Natalyn Szymanowski M  10/09/2015, 12:36 PM  Pager [phone] If no answer or after 5 PM call [phone]

## 2015-10-09 NOTE — Progress Notes (Addendum)
[facility] PROGRESS NOTE  Cynthia [name] [id number] DOB: 1935/12/10 DOA: 10/06/2015 PCP: [name], MD  Assessment/Plan:  Shock/hypotension- in the setting of coffee-ground emesis, likely upper GI bleed. Discontinue Plavix. Started normal saline fluid bolus, IV Protonix infusion, occult blood testing of upper GI gastric/duodenum. Check stat CBC, lactic acid, CMP. Transfer the patient to stepdown  Paresthesia of both hands and both feet accompanied by loss of balance and lower extremity weakness.  Acute onset. Started last night after an episode of dizziness. Etiology unclear. No acute findings on head CT -Neurology evaluation in progress.  -B12 level is 457 - Magnesium is 1.9, CK 81 - MRI/MRA brain negative for stroke. Started on gabapentin 100 g by mouth 3 times a day - CT cervical spine shows changes of cervical spondylosis.  Colon cancer metastasized to liver. Patient is s/p colon resection followed by chemo. Found in Dec 2016 to have liver lesions, s/p ablation of liver lesions earlier this month.  Hypertension. BP mildly elevated in ED. MRI head is negative for stroke We'll continue with home medication Toprol-XL 37.5 mg by mouth daily  Hyperlipidemia.  Continue home statin  Hx of CVA per patient, no old infarcts seen on imaging   Code Status: Partial code, DO NOT INTUBATE Family Communication: Discussed with patient's daughter at bedside Disposition Plan: Pending, patient now has hypotension, likely upper GI bleed. We will transfer to stepdown/ICU.   Consultants:  *Neurology  Procedures:  Carotid Dopplers  Antibiotics:  None  HPI/Subjective: 80 y.o. female with metastatic colon cancer, status post right colectomy and chemotherapy 2015. In December patient was found to have liver lesions, biopsy confirmed metastatic colon cancer. She underwent ablation of the liver lesions on the tenth of this month. Patient presented to the emergency  department today with complaints of tingling in her right arm and leg which started last night. Hands and feet feel like they are asleep or "cold". She was very dizzy last night before bedtime and noticed a colorful floater in her left eye. This am patient was unable to standing secondary to poor balance and weakness of her lower extremities. Patient had a stroke a few years ago. She was left with some left-sided weakness but normally walks around independently. Patient has not started any new medications lately other than extra strength Tylenol. She gives a history of B12 deficiency.   This morning patient was found to be hypotensive, lethargic, also had coffee-ground emesis. Patient was recently put on Plavix were neurology recommendation. Patient denied any cough, shortness of breath. No abdominal pain. No dysuria.    Objective: Filed Vitals:   10/09/15 1315 10/09/15 1330  BP: 95/68 83/58  Pulse: 85 66  Temp:    Resp: 18 18    Intake/Output Summary (Last 24 hours) at 10/09/15 1356 Last data filed at 10/09/15 1300  Gross per 24 hour  Intake 3618.5 ml  Output      0 ml  Net 3618.5 ml   Filed Weights   10/06/15 0628  Weight: 62.097 kg (136 lb 14.4 oz)    Exam:   General:  Appears  lethargic   Cardiovascular: S1-S2 regular  Respiratory: Clear to auscultation bilaterally  Abdomen: Soft, nontender, no organomegaly  Musculoskeletal: No cyanosis/clubbing/edema of the lower extremities   Data Reviewed: Basic Metabolic Panel:  Recent Labs Lab 10/06/15 0612 10/06/15 0620 10/07/15 1041 10/08/15 0521 10/09/15 1030  NA 139 140 138  --  130*  K 4.2 4.2 4.0  --  3.6  CL  104 101 99*  --  97*  CO2 25  --  25  --  21*  GLUCOSE 109* 105* 150*  --  201*  BUN 8 9 13   --  13  CREATININE 0.62 0.60 0.65  --  1.01*  CALCIUM 9.1  --  9.7  --  8.4*  MG  --   --   --  1.9  --    Liver Function Tests:  Recent Labs Lab 10/06/15 0612 10/07/15 1041 10/09/15 1030  AST 31 30 36   ALT 36 32 24  ALKPHOS 46 50 40  BILITOT 0.2* 0.5 0.5  PROT 6.7 7.4 5.8*  ALBUMIN 3.1* 3.1* 2.5*   CBC:  Recent Labs Lab 10/06/15 0612 10/06/15 0620 10/09/15 1030  WBC 7.5  --  19.2*  NEUTROABS 5.8  --   --   HGB 12.0 14.3 10.7*  HCT 36.7 42.0 31.6*  MCV 89.5  --  86.3  PLT 332  --  525*   Cardiac Enzymes:  Recent Labs Lab 10/07/15 1041  CKTOTAL 81     Studies: Mr Cervical Spine W Wo Contrast  10/08/2015  CLINICAL DATA:  80 year old female with ataxia, loss of balance and lower extremity weakness with bilateral upper and lower extremity paresthesia, acute onset. Metastatic colon cancer. Subsequent encounter. EXAM: MRI CERVICAL SPINE WITHOUT AND WITH CONTRAST TECHNIQUE: Multiplanar and multiecho pulse sequences of the cervical spine, to include the craniocervical junction and cervicothoracic junction, were obtained according to standard protocol without and with intravenous contrast. CONTRAST:  39mL MULTIHANCE GADOBENATE DIMEGLUMINE 529 MG/ML IV SOLN COMPARISON:  Brain MRI 10/06/2015.  PET-CT 07/28/2015 FINDINGS: Straightening of cervical lordosis. Multilevel cervical degenerative endplate changes. There is trace superior endplate marrow edema anteriorly at C7 which appears degenerative in nature. No marrow lesion in the cervical spine suspicious for metastatic disease. Visible bone marrow signal at the skullbase appears stable and within normal limits. Cervicomedullary junction is within normal limits. Negative visualized posterior fossa structures. Spinal cord signal is within normal limits at all visualized levels. No abnormal cervical intradural enhancement. Negative paraspinal soft tissues. Cervical spine degenerative changes: C2-C3: Mild to moderate facet hypertrophy greater on the right. Borderline to mild right C3 foraminal stenosis. C3-C4: Disc space loss with circumferential disc osteophyte complex eccentric to the right. Broad-based posterior component of disc. Narrowing of  the ventral CSF space without spinal stenosis. Moderate to severe uncovertebral and facet hypertrophy on the right. Mild left and severe right C4 foraminal stenosis. C4-C5: Trace anterolisthesis with moderate to severe facet hypertrophy greater on the right. Mild disc bulge. No spinal stenosis. Moderate right C5 foraminal stenosis. C5-C6: Disc space loss. Circumferential disc osteophyte complex broad-based posterior component of disc. Mild ligament flavum hypertrophy. Borderline to mild spinal stenosis. Uncovertebral hypertrophy with moderate bilateral C6 foraminal stenosis. C6-C7: Disc space loss. Circumferential disc osteophyte complex. Broad-based posterior component of disc eccentric to the left. Uncovertebral hypertrophy greater on the left. Mild ligament flavum hypertrophy. Borderline to mild spinal stenosis. Severe left C7 foraminal stenosis. C7-T1: Moderate facet hypertrophy.  No stenosis. No upper thoracic spinal stenosis. IMPRESSION: 1. No metastatic disease identified in the cervical spine. Mild degenerative appearing endplate marrow edema at C7. 2. Multilevel disc, endplate, and facet degeneration in the cervical spine. 3. Subsequent borderline to mild spinal stenosis at C5-C6 and C6-C7 with no spinal cord mass effect or signal abnormality. 4. Subsequent moderate or severe neural foraminal stenosis at the right C4, right C5, bilateral C6, and left C7 nerve levels.  Electronically Signed   By: Genevie Ann M.D.   On: 10/08/2015 17:39   Dg Chest Port 1 View  10/09/2015  CLINICAL DATA:  Central catheter placement EXAM: PORTABLE CHEST 1 VIEW COMPARISON:  October 09, 2015 study obtained earlier in the day FINDINGS: Central catheter tip is in the superior vena cava. Nasogastric tube tip and side port are below the diaphragm with the side port seen in the stomach. No pneumothorax. There is atelectatic change in the bases, slightly more on the left than on the right. Lungs elsewhere clear. Heart size and pulmonary  vascularity are normal. No adenopathy. No bone lesions. IMPRESSION: Tube and catheter positions as described without pneumothorax. Bibasilar atelectasis. No consolidation. No change in cardiac silhouette. Electronically Signed   By: Lowella Grip III M.D.   On: 10/09/2015 12:43   Dg Chest Port 1 View  10/09/2015  CLINICAL DATA:  Hemoptysis EXAM: PORTABLE CHEST 1 VIEW COMPARISON:  10/06/2015 FINDINGS: Cardiomediastinal silhouette is stable. Elevation of the right hemidiaphragm again noted. Again noted triangular shape atelectasis in right middle lobe/ infrahilar region. There is new linear atelectasis left base medially. No segmental infiltrate or pulmonary edema. IMPRESSION: Elevation of the right hemidiaphragm again noted. Again noted triangular shape atelectasis in right middle lobe/ infrahilar region. There is new linear atelectasis left base medially. No segmental infiltrate or pulmonary edema. Electronically Signed   By: Lahoma Crocker M.D.   On: 10/09/2015 10:24    Scheduled Meds: .  stroke: mapping our early stages of recovery book   Does not apply Once  . antiseptic oral rinse  7 mL Mouth Rinse q12n4p  . chlorhexidine  15 mL Mouth Rinse BID  . enoxaparin (LOVENOX) injection  40 mg Subcutaneous Q24H  . gabapentin  100 mg Oral TID  . metoprolol succinate  37.5 mg Oral QPM  . pantoprazole (PROTONIX) IV  80 mg Intravenous Once  . [START ON 10/12/2015] pantoprazole (PROTONIX) IV  40 mg Intravenous Q12H   Continuous Infusions: . sodium chloride    . sodium chloride 10 mL/hr at 10/09/15 1300  . sodium chloride 75 mL/hr at 10/09/15 1300  . pantoprozole (PROTONIX) infusion      Active Problems:   Colon cancer metastasized to liver [facility])   Peripheral neuropathy (HCC)   Paresthesia of both hands   Paresthesia of both feet   Cerebral embolism with cerebral infarction   Shock ([place])    Time spent: 25 min    [facility] Pager [phone]. If 7PM-7AM, please contact  night-coverage at [email or web address], password [facility] 10/09/2015, 1:56 PM  LOS: 0 days

## 2015-10-09 NOTE — Progress Notes (Signed)
Notified neuro MD on call for increase in NIH stroke scale from 5 to 10. Pt is A&O x 4 and in NAD. No new orders received at this time. Will continue to monitor closely.  Sherlie Ban, RN

## 2015-10-09 NOTE — Progress Notes (Signed)
Subjective: Became hypotensive this AM. Upper GI bleed suspected. Transferred to MICU.   Objective: Current vital signs: BP 148/70 mmHg  Pulse 74  Temp(Src) 97.4 F (36.3 C) (Oral)  Resp 18  Ht 5' 2"  (1.575 m)  Wt 62.097 kg (136 lb 14.4 oz)  BMI 25.03 kg/m2  SpO2 98% Vital signs in last 24 hours: Temp:  [97.4 F (36.3 C)-98.1 F (36.7 C)] 97.4 F (36.3 C) (02/24 2050) Pulse Rate:  [60-91] 74 (02/24 2100) Resp:  [14-22] 18 (02/24 2100) BP: (65-175)/(50-108) 148/70 mmHg (02/24 2100) SpO2:  [93 %-100 %] 98 % (02/24 2100)  Intake/Output from previous day:   Intake/Output this shift: Total I/O In: 255 [I.V.:255] Out: 15 [Urine:15] Nutritional status:    Neurologic Exam: General: Coffee ground emesis occurred after transfer to MICU.   Mental Status: Drowsy, oriented, thought content appropriate. Speech fluent without evidence of aphasia. Good insight. Able to follow all commands without difficulty. Cranial Nerves: II: Visual fields grossly normal, pupils equal, round, reactive to light and accommodation III,IV, VI: ptosis not present, extra-ocular motions intact bilaterally V: Decreased sensation to left face. VII: No facial droop VIII: hearing intact to conversation IX,X: No hypophonia or hoarseness noted. XI: bilateral shoulder shrug symmetric XII: midline tongue extension Motor: 4/5 strength bilateral upper extremities. 3-4/5 lower extremities without asymmetry.  Sensory: Severe loss of vibration sensation and proprioception to toes and ankles. Moderately decreased vibration sensation at knees. Decreased temperature sensation to right leg sparing the foot. Decreased temperature left forearm. Deep Tendon Reflexes: 1+ bilateral brachioradialis. 0 at biceps, patellae and achilles bilaterally.  Plantars: Equivocal.  Cerebellar: Dysmetria with left finger to nose.  Gait: Deferred.   Lab Results: Basic Metabolic Panel:  Recent Labs Lab 10/06/15 0612 10/06/15 0620  10/07/15 1041 10/08/15 0521 10/09/15 1030  NA 139 140 138  --  130*  K 4.2 4.2 4.0  --  3.6  CL 104 101 99*  --  97*  CO2 25  --  25  --  21*  GLUCOSE 109* 105* 150*  --  201*  BUN 8 9 13   --  13  CREATININE 0.62 0.60 0.65  --  1.01*  CALCIUM 9.1  --  9.7  --  8.4*  MG  --   --   --  1.9  --     Liver Function Tests:  Recent Labs Lab 10/06/15 0612 10/07/15 1041 10/09/15 1030  AST 31 30 36  ALT 36 32 24  ALKPHOS 46 50 40  BILITOT 0.2* 0.5 0.5  PROT 6.7 7.4 5.8*  ALBUMIN 3.1* 3.1* 2.5*   No results for input(s): LIPASE, AMYLASE in the last 168 hours. No results for input(s): AMMONIA in the last 168 hours.  CBC:  Recent Labs Lab 10/06/15 0612 10/06/15 0620 10/09/15 1030 10/09/15 1400  WBC 7.5  --  19.2* 15.8*  NEUTROABS 5.8  --   --   --   HGB 12.0 14.3 10.7* 10.0*  HCT 36.7 42.0 31.6* 29.7*  MCV 89.5  --  86.3 86.6  PLT 332  --  525* 458*    Cardiac Enzymes:  Recent Labs Lab 10/07/15 1041  CKTOTAL 81    Lipid Panel:  Recent Labs Lab 10/07/15 0555  CHOL 149  TRIG 56  HDL 39*  CHOLHDL 3.8  VLDL 11  LDLCALC 99    CBG: No results for input(s): GLUCAP in the last 168 hours.  Microbiology: Results for orders placed or performed during the hospital encounter of  10/06/15  MRSA PCR Screening     Status: None   Collection Time: 10/09/15 12:58 PM  Result Value Ref Range Status   MRSA by PCR NEGATIVE NEGATIVE Final    Comment:        The GeneXpert MRSA Assay (FDA approved for NASAL specimens only), is one component of a comprehensive MRSA colonization surveillance program. It is not intended to diagnose MRSA infection nor to guide or monitor treatment for MRSA infections.     Coagulation Studies: No results for input(s): LABPROT, INR in the last 72 hours.  Imaging: Mr Cervical Spine W Wo Contrast  10/08/2015  CLINICAL DATA:  80 year old female with ataxia, loss of balance and lower extremity weakness with bilateral upper and lower  extremity paresthesia, acute onset. Metastatic colon cancer. Subsequent encounter. EXAM: MRI CERVICAL SPINE WITHOUT AND WITH CONTRAST TECHNIQUE: Multiplanar and multiecho pulse sequences of the cervical spine, to include the craniocervical junction and cervicothoracic junction, were obtained according to standard protocol without and with intravenous contrast. CONTRAST:  52m MULTIHANCE GADOBENATE DIMEGLUMINE 529 MG/ML IV SOLN COMPARISON:  Brain MRI 10/06/2015.  PET-CT 07/28/2015 FINDINGS: Straightening of cervical lordosis. Multilevel cervical degenerative endplate changes. There is trace superior endplate marrow edema anteriorly at C7 which appears degenerative in nature. No marrow lesion in the cervical spine suspicious for metastatic disease. Visible bone marrow signal at the skullbase appears stable and within normal limits. Cervicomedullary junction is within normal limits. Negative visualized posterior fossa structures. Spinal cord signal is within normal limits at all visualized levels. No abnormal cervical intradural enhancement. Negative paraspinal soft tissues. Cervical spine degenerative changes: C2-C3: Mild to moderate facet hypertrophy greater on the right. Borderline to mild right C3 foraminal stenosis. C3-C4: Disc space loss with circumferential disc osteophyte complex eccentric to the right. Broad-based posterior component of disc. Narrowing of the ventral CSF space without spinal stenosis. Moderate to severe uncovertebral and facet hypertrophy on the right. Mild left and severe right C4 foraminal stenosis. C4-C5: Trace anterolisthesis with moderate to severe facet hypertrophy greater on the right. Mild disc bulge. No spinal stenosis. Moderate right C5 foraminal stenosis. C5-C6: Disc space loss. Circumferential disc osteophyte complex broad-based posterior component of disc. Mild ligament flavum hypertrophy. Borderline to mild spinal stenosis. Uncovertebral hypertrophy with moderate bilateral C6  foraminal stenosis. C6-C7: Disc space loss. Circumferential disc osteophyte complex. Broad-based posterior component of disc eccentric to the left. Uncovertebral hypertrophy greater on the left. Mild ligament flavum hypertrophy. Borderline to mild spinal stenosis. Severe left C7 foraminal stenosis. C7-T1: Moderate facet hypertrophy.  No stenosis. No upper thoracic spinal stenosis. IMPRESSION: 1. No metastatic disease identified in the cervical spine. Mild degenerative appearing endplate marrow edema at C7. 2. Multilevel disc, endplate, and facet degeneration in the cervical spine. 3. Subsequent borderline to mild spinal stenosis at C5-C6 and C6-C7 with no spinal cord mass effect or signal abnormality. 4. Subsequent moderate or severe neural foraminal stenosis at the right C4, right C5, bilateral C6, and left C7 nerve levels. Electronically Signed   By: HGenevie AnnM.D.   On: 10/08/2015 17:39   Dg Chest Port 1 View  10/09/2015  CLINICAL DATA:  Central catheter placement EXAM: PORTABLE CHEST 1 VIEW COMPARISON:  October 09, 2015 study obtained earlier in the day FINDINGS: Central catheter tip is in the superior vena cava. Nasogastric tube tip and side port are below the diaphragm with the side port seen in the stomach. No pneumothorax. There is atelectatic change in the bases, slightly more on the left  than on the right. Lungs elsewhere clear. Heart size and pulmonary vascularity are normal. No adenopathy. No bone lesions. IMPRESSION: Tube and catheter positions as described without pneumothorax. Bibasilar atelectasis. No consolidation. No change in cardiac silhouette. Electronically Signed   By: Lowella Grip III M.D.   On: 10/09/2015 12:43   Dg Chest Port 1 View  10/09/2015  CLINICAL DATA:  Hemoptysis EXAM: PORTABLE CHEST 1 VIEW COMPARISON:  10/06/2015 FINDINGS: Cardiomediastinal silhouette is stable. Elevation of the right hemidiaphragm again noted. Again noted triangular shape atelectasis in right middle  lobe/ infrahilar region. There is new linear atelectasis left base medially. No segmental infiltrate or pulmonary edema. IMPRESSION: Elevation of the right hemidiaphragm again noted. Again noted triangular shape atelectasis in right middle lobe/ infrahilar region. There is new linear atelectasis left base medially. No segmental infiltrate or pulmonary edema. Electronically Signed   By: Lahoma Crocker M.D.   On: 10/09/2015 10:24    Medications: Medication list reviewed.   Assessment: 1. Acute onset of gait ataxia with dizziness, nausea, numbness and tingling of feet. Exam in ED revealed no lateralized motor/sensory deficit or ataxia. MRI revealed no acute infarction. DDx included small cerebellar TIA, atypical presentation of Guillain-Barre syndrome. Thiamine and B12 levels drawn this admission are normal.  2. DDx now also includes paraneoplastic encephalomyelitis, which, although rare, should be further evaluated with LP and MRI thoracic and lumbar spine given continued lower extremity weakness and sensory level at T12 seen on today's exam. A distinct sensory level was not seen on initial exam this admission, suggestive of possible transverse myelitis.   3. Severely impaired proprioception and vibration sensation of lower extremities. Also on DDx for item #2 sensory findings would be a length-dependent axonal neuropathy secondary to chemotherapy. GBS possible, but absent reflexes are more likely secondary to chemotherapy.  4. MRI of cervical spine did not reveal a structural explanation for her lower extremity weakness.  5. New upper GI bleed with hypotension.  6. Stated prior history of stroke. Felt by patient to be the etiology for the LUE ataxia seen on exam, which she states is chronic since her stroke. No definite chronic stroke seen on MRI. Possible MRI negative cerebral infarction (rare) versus paraneoplastic syndrome for her LUE ataxia (also on DDx for her acute onset of lower extremity ataxia  precipitating this admission).   Recommendations: 1. Agree with holding Plavix. When stable and no further bleeding, secondary stroke prevention should be with ASA.  vical spine to assess for possible dorsal column lesion.  2. If above work up is unrevealing, may need outpatient EMG/NCS to further assess whether the distal neuropathy is axonal, demyelinating or both. Also to assess if there is a NMJ defect since paraneoplastic syndrome is possible given her diagnosis of colon cancer.  3. Order serum anti-voltage-gated calcium channel antibody titer to assess for possible Lambert-Eaton myasthenic syndrome (a paraneoplastic syndrome resulting in weakness secondary to NMJ defect).  4. When stable and Plavix has been discontinued for 3-5 days, consider obtaining LP. Labs should include cell count, protein, albumin, glucose, IgG and oligoclonal bands. Concomitant blood draw for serum IgG and albumin levels should also be obtained to calculate intrathecal IgG synthesis rate.  5. If weakness continues to worsen and it is not felt to be secondary to hypotension/deconditioning/intercurrent illness, consider a trial of IVIG. IVIG is one of the first line treatments for GBS and paraneoplastic encephalitis, as well as transverse myelitis.  6. ESR, C-reactive protein, P-ANCA and C-ANCA. 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  A 7. When stable, and if renal function is adequate, obtain MRI of thoracic and lumbar spine with and without contrast. ` 8. Extensively discussed with family today.   65 total minutes spent in the care of this critically ill patient, including bedside evaluation and family discussion.   Kerney Elbe, MD 10/09/2015, 10:13 PM

## 2015-10-09 NOTE — Progress Notes (Signed)
Patient started on 1 litre bolus, but blood pressure still low in the 65/44. MD notified. MD ordered patient to be transferred to ICU.

## 2015-10-09 NOTE — Progress Notes (Signed)
PULMONARY / CRITICAL CARE MEDICINE   Name: Cynthia [name] MRN: [id number] DOB: [birth year]    ADMISSION DATE:  10/06/2015 CONSULTATION DATE: 10/09/15  REFERRING MD: Clide Dales   CHIEF COMPLAINT: Complaints of tingling in her right arm and leg which  HISTORY OF PRESENT ILLNESS:  Cynthia [name] is a 80 y.o. White  female with metastatic colon cancer, status post right colectomy and chemotherapy 2015. In December patient was found to have liver lesions, biopsy was performed and  Confirmed to have metastatic colon cancer. She underwent ablation of the liver lesions on the tenth of this month. Patient presented to the emergency department on 10/06/15 with complaints of tingling in her right arm and leg which started previous night that  .   .On 10/07/15 patient was unable to stand due   to poor balance and weakness of her lower extremities. Patient had a stroke a few years ago. She was left with some left-sided weakness but normally walks around independently.  Patient continues to feel weak and tired, numbness and tingling of upper extrimity and neurology was following her  On 10/09/15  Patient was found to be Hypotensive,85/50  And was as low as 65/44and was transferred to the ICU for closer monitoring.  PAST MEDICAL HISTORY :  She  has a past medical history of Anemia; Blood transfusion without reported diagnosis (jan 2015); Heart murmur; Hypertension; Liver cyst; C. difficile colitis (03/05/14/03/22/14/04/05/14); Stroke [facility]) (12/2014); Cervical cancer ([place]) (1972); Colon cancer ([place]) (JAN 2015); Colon cancer ([place]); Cancer ([place]); Complication of anesthesia; and Tinnitus of both ears (ALL THE TIME).  PAST SURGICAL HISTORY: She  has past surgical history that includes Back surgery (1981); Tubal ligation (1968); Laparoscopic right colectomy (Right, 10/22/2013); Salpingoophorectomy (10/22/2013); and Abdominal hysterectomy.  No Known Allergies  No current facility-administered medications on file  prior to encounter.   Current Outpatient Prescriptions on File Prior to Encounter  Medication Sig  . aspirin EC 81 MG tablet Take 1 tablet (81 mg total) by mouth daily. (Patient taking differently: Take 81 mg by mouth every morning. )  . atorvastatin (LIPITOR) 40 MG tablet Take 1 tablet (40 mg total) by mouth daily.  . metoprolol succinate (TOPROL-XL) 25 MG 24 hr tablet Take 37.5 mg by mouth every evening.   . Multiple Vitamin (MULTIVITAMIN WITH MINERALS) TABS tablet Take 1 tablet by mouth every morning.     FAMILY HISTORY:  Her indicated that her mother is deceased. She indicated that her father is deceased. She indicated that her sister is deceased. She indicated that her brother is alive.   SOCIAL HISTORY: She  reports that she has never smoked. She has never used smokeless tobacco. She reports that she does not drink alcohol or use illicit drugs.  REVIEW OF SYSTEMS:   Review of Systems  Constitutional: Positive for malaise/fatigue. Negative for fever and chills.  HENT: Negative for congestion, ear discharge, ear pain, hearing loss, nosebleeds, sore throat and tinnitus.   Eyes: Negative for blurred vision, double vision, photophobia, pain, discharge and redness.  Respiratory: Negative for cough, hemoptysis, sputum production, shortness of breath, wheezing and stridor.   Cardiovascular: Negative for chest pain, palpitations, orthopnea, claudication and leg swelling.  Gastrointestinal: Negative for abdominal pain, diarrhea, constipation, blood in stool and melena.  Genitourinary: Negative for urgency, frequency and hematuria.  Musculoskeletal: Negative for back pain and neck pain.  Skin: Negative for itching and rash.  Neurological: Positive for tingling and weakness. Negative for speech change, focal weakness, seizures and headaches.  Endo/Heme/Allergies: Negative for polydipsia.  Psychiatric/Behavioral: Negative for hallucinations and substance abuse.   SUBJECTIVE:  Patient was   Awake , alert, oriented, answers all questions appropriately. On arrival to the unit she was normotensive but started vomitting . Therefore N/G tube was inserted and placed on low intermittent wall suction. The contents og the n/g tube was more like coffee colored therefore GI was consulted for suspected GI bleed  VITAL SIGNS: BP 83/58 mmHg  Pulse 66  Temp(Src) 97.8 F (36.6 C) (Oral)  Resp 18  Ht 5\' 2"  (1.575 m)  Wt 136 lb 14.4 oz (62.097 kg)  BMI 25.03 kg/m2  SpO2 100%  HEMODYNAMICS:    VENTILATOR SETTINGS:   INTAKE / OUTPUT:   PHYSICAL EXAMINATION: General:  Elderly white female,found bed,  Neuro:  Awake, alert, oriented, follows commands HEENT: Atraumatic, normocephalic Cardiovascular: SI,S2, rrr, no MRG Lungs: Clear,no use of accessory muscle Abdomen: Soft, nontender Musculoskeletal: normal muscle tone  Skin:Intact  LABS:  BMET  Recent Labs Lab 10/06/15 0612 10/06/15 0620 10/07/15 1041 10/09/15 1030  NA 139 140 138 130*  K 4.2 4.2 4.0 3.6  CL 104 101 99* 97*  CO2 25  --  25 21*  BUN 8 9 13 13   CREATININE 0.62 0.60 0.65 1.01*  GLUCOSE 109* 105* 150* 201*    Electrolytes  Recent Labs Lab 10/06/15 0612 10/07/15 1041 10/08/15 0521 10/09/15 1030  CALCIUM 9.1 9.7  --  8.4*  MG  --   --  1.9  --     CBC  Recent Labs Lab 10/06/15 0612 10/06/15 0620 10/09/15 1030 10/09/15 1400  WBC 7.5  --  19.2* 15.8*  HGB 12.0 14.3 10.7* 10.0*  HCT 36.7 42.0 31.6* 29.7*  PLT 332  --  525* 458*    Coag's  Recent Labs Lab 10/06/15 0612  APTT 28  INR 1.19    Sepsis Markers  Recent Labs Lab 10/09/15 1047  LATICACIDVEN 3.9*    ABG  Recent Labs Lab 10/09/15 1123  PHART 7.354  PCO2ART 33.0*  PO2ART 59.0*    Liver Enzymes  Recent Labs Lab 10/06/15 0612 10/07/15 1041 10/09/15 1030  AST 31 30 36  ALT 36 32 24  ALKPHOS 46 50 40  BILITOT 0.2* 0.5 0.5  ALBUMIN 3.1* 3.1* 2.5*    Cardiac Enzymes No results for input(s): TROPONINI,  PROBNP in the last 168 hours.  Glucose No results for input(s): GLUCAP in the last 168 hours.  Imaging Mr Cervical Spine W Wo Contrast  10/08/2015  CLINICAL DATA:  80 year old female with ataxia, loss of balance and lower extremity weakness with bilateral upper and lower extremity paresthesia, acute onset. Metastatic colon cancer. Subsequent encounter. EXAM: MRI CERVICAL SPINE WITHOUT AND WITH CONTRAST TECHNIQUE: Multiplanar and multiecho pulse sequences of the cervical spine, to include the craniocervical junction and cervicothoracic junction, were obtained according to standard protocol without and with intravenous contrast. CONTRAST:  39mL MULTIHANCE GADOBENATE DIMEGLUMINE 529 MG/ML IV SOLN COMPARISON:  Brain MRI 10/06/2015.  PET-CT 07/28/2015 FINDINGS: Straightening of cervical lordosis. Multilevel cervical degenerative endplate changes. There is trace superior endplate marrow edema anteriorly at C7 which appears degenerative in nature. No marrow lesion in the cervical spine suspicious for metastatic disease. Visible bone marrow signal at the skullbase appears stable and within normal limits. Cervicomedullary junction is within normal limits. Negative visualized posterior fossa structures. Spinal cord signal is within normal limits at all visualized levels. No abnormal cervical intradural enhancement. Negative paraspinal soft tissues. Cervical spine degenerative changes:  C2-C3: Mild to moderate facet hypertrophy greater on the right. Borderline to mild right C3 foraminal stenosis. C3-C4: Disc space loss with circumferential disc osteophyte complex eccentric to the right. Broad-based posterior component of disc. Narrowing of the ventral CSF space without spinal stenosis. Moderate to severe uncovertebral and facet hypertrophy on the right. Mild left and severe right C4 foraminal stenosis. C4-C5: Trace anterolisthesis with moderate to severe facet hypertrophy greater on the right. Mild disc bulge. No spinal  stenosis. Moderate right C5 foraminal stenosis. C5-C6: Disc space loss. Circumferential disc osteophyte complex broad-based posterior component of disc. Mild ligament flavum hypertrophy. Borderline to mild spinal stenosis. Uncovertebral hypertrophy with moderate bilateral C6 foraminal stenosis. C6-C7: Disc space loss. Circumferential disc osteophyte complex. Broad-based posterior component of disc eccentric to the left. Uncovertebral hypertrophy greater on the left. Mild ligament flavum hypertrophy. Borderline to mild spinal stenosis. Severe left C7 foraminal stenosis. C7-T1: Moderate facet hypertrophy.  No stenosis. No upper thoracic spinal stenosis. IMPRESSION: 1. No metastatic disease identified in the cervical spine. Mild degenerative appearing endplate marrow edema at C7. 2. Multilevel disc, endplate, and facet degeneration in the cervical spine. 3. Subsequent borderline to mild spinal stenosis at C5-C6 and C6-C7 with no spinal cord mass effect or signal abnormality. 4. Subsequent moderate or severe neural foraminal stenosis at the right C4, right C5, bilateral C6, and left C7 nerve levels. Electronically Signed   By: Genevie Ann M.D.   On: 10/08/2015 17:39   Dg Chest Port 1 View  10/09/2015  CLINICAL DATA:  Central catheter placement EXAM: PORTABLE CHEST 1 VIEW COMPARISON:  October 09, 2015 study obtained earlier in the day FINDINGS: Central catheter tip is in the superior vena cava. Nasogastric tube tip and side port are below the diaphragm with the side port seen in the stomach. No pneumothorax. There is atelectatic change in the bases, slightly more on the left than on the right. Lungs elsewhere clear. Heart size and pulmonary vascularity are normal. No adenopathy. No bone lesions. IMPRESSION: Tube and catheter positions as described without pneumothorax. Bibasilar atelectasis. No consolidation. No change in cardiac silhouette. Electronically Signed   By: Lowella Grip III M.D.   On: 10/09/2015 12:43    Dg Chest Port 1 View  10/09/2015  CLINICAL DATA:  Hemoptysis EXAM: PORTABLE CHEST 1 VIEW COMPARISON:  10/06/2015 FINDINGS: Cardiomediastinal silhouette is stable. Elevation of the right hemidiaphragm again noted. Again noted triangular shape atelectasis in right middle lobe/ infrahilar region. There is new linear atelectasis left base medially. No segmental infiltrate or pulmonary edema. IMPRESSION: Elevation of the right hemidiaphragm again noted. Again noted triangular shape atelectasis in right middle lobe/ infrahilar region. There is new linear atelectasis left base medially. No segmental infiltrate or pulmonary edema. Electronically Signed   By: Lahoma Crocker M.D.   On: 10/09/2015 10:24     STUDIES:   MRI head wo contrast >2/22 was indicative ofNo evidence of large vessel occlusion or significant proximal stenosis. Moderate branch vessel irregularity suggestive of Atherosclerosis MR Brain wo contrast>2/21 indicative ofAtrophy and chronic microvascular ischemia  CThead wo contrast>2/211 was negative  CULTURES: None  ANTIBIOTICS: None  SIGNIFICANT EVENTS: ED> 2/21 ICU> 2/22  LINES/TUBES: Central line> 10/09/15  DISCUSSION: Cynthia [name] is a 80 y.o. White  female with metastatic colon cancer, status post right colectomy and chemotherapy 2015. She came eo the ED on  10/06/15 with left sided  Numbness, tingling and increased weakness .  She was moved to ICU on 10/09/15 with hypotension  For closer monitoring  ASSESSMENT / PLAN:  PULMONARY A: Risk for aspiration. P:   NPO now N/G tube on low intermittent suction Will intubate if airway compromised  CARDIOVASCULAR A:  Hypotension- resolved P:  Continue  Fluids MAP GOAL>65  RENAL A:   AKI Thrombocytosis Hypoalbunemia P:   Trend chem  Volume resuscitated  GASTROINTESTINAL A:    Hx of metastatic colon cancer S/P right colectomy and chemo(2015) Liver lesion  Acute upper GI bleed  P:   Gastroentrology   Consulted Continue Protonix  HEMATOLOGIC A:   At risk of anemia  P:  Transfuse for HgB<7  INFECTIOUS A:   Leukocytosis possibly due to stress response   P:   Possibly due to stress response CXR  negative    ENDOCRINE A:   No active issue P:   SSI if BS> 150mg /dl  NEUROLOGIC A:   Acute CVA not present P:   RASS goal: 0 Neurology following Dc plavix empiric  FAMILY  - Updates: No family present at the bedside  - Inter-disciplinary family meet or Palliative Care meeting due by: 10/14/15  Bincy Varughese,AG-ACNP Pulmonary & Critical Care Pulmonary and [place] Pager: [phone]  10/09/2015, 2:34 PM   STAFF NOTE: I, Merrie Roof, MD FACP have personally reviewed patient's available data, including medical history, events of note, physical examination and test results as part of my evaluation. I have discussed with resident/NP and other care providers such as pharmacist, RN and RRT. In addition, I personally evaluated patient and elicited key findings of: upon evaluation on floors, she was obtunded but could awaken to commanda and speak, she looked hypoperfused, mottled knees, noted coffee ground blood in mouth, no sig tachy but on BB, concern is acute bleed upper with plavix as source in setting colon Cancer recent embolization liver lesions, move to icu stat, obtain cbc frequent, ensure type and hold, GI consul tin case worsens and needs scope, stat coags, lactic, bolus, and place large bore line for resus and possible need pressors, MAP goal 65, npo, i also updated son in room extensive and d/w neurology there working dx, dc plavix of course, if needed add levophed, obtain cvp if declines to need pressors The patient is critically ill with multiple organ systems failure and requires high complexity decision making for assessment and support, frequent evaluation and titration of therapies, application of advanced monitoring  technologies and extensive interpretation of multiple databases.   Critical Care Time devoted to patient care services described in this note is 53 Minutes. This time reflects time of care of this signee: Merrie Roof, MD FACP. This critical care time does not reflect procedure time, or teaching time or supervisory time of PA/NP/Med student/Med Resident etc but could involve care discussion time. Rest per NP/medical resident whose note is outlined above and that I agree with   [name]. Titus Mould, MD, [place] Pgr: [place] [facility] 10/09/2015 4:13 PM

## 2015-10-09 NOTE — Procedures (Signed)
Central Venous Catheter Insertion Procedure Note Cynthia [name] [id number] Jan 01, 1936  Procedure: Insertion of Central Venous Catheter Indications: Assessment of intravascular volume, Drug and/or fluid administration and Frequent blood sampling  Procedure Details Consent: Unable to obtain consent because of emergent medical necessity. Time Out: Verified patient identification, verified procedure, site/side was marked, verified correct patient position, special equipment/implants available, medications/allergies/relevent history reviewed, required imaging and test results available.  Performed  Maximum sterile technique was used including antiseptics, cap, gloves, gown, hand hygiene, mask and sheet. Skin prep: Chlorhexidine; local anesthetic administered A antimicrobial bonded/coated single lumen catheter was placed in the right internal jugular vein using the Seldinger technique.  Placement of the guide wire was verified by Utrasound Evaluation Blood flow good Complications: No apparent complications Patient did tolerate procedure well. Chest X-ray ordered to verify placement.  CXR: pending.  Cynthia [name] 10/09/2015, 12:11 PM  Cynthia [name]  Cynthia [name]. Cynthia Mould, MD, [place] Pgr: Cynthia [name] Pulmonary & Critical Care

## 2015-10-09 NOTE — Progress Notes (Signed)
   10/09/15 1145  PT Visit Information  Reason Eval/Treat Not Completed Medical issues which prohibited therapy (Pt transferred to ICU post rapid response.  PT to follow up due to change in medical status.  )

## 2015-10-09 NOTE — Progress Notes (Signed)
Noted patient's BP 85/50. MD on unit, was verbally notified.

## 2015-10-09 NOTE — Progress Notes (Signed)
MD saw patient and requested patient be transferred to step down.

## 2015-10-10 MED ORDER — LABETALOL HCL 5 MG/ML IV SOLN
10.0000 mg | INTRAVENOUS | Status: DC | PRN
  Administered 2015-10-10 (×2): 10 mg via INTRAVENOUS
  Filled 2015-10-10 (×2): qty 4

## 2015-10-10 MED ORDER — HYDRALAZINE HCL 20 MG/ML IJ SOLN
10.0000 mg | INTRAMUSCULAR | Status: DC | PRN
  Administered 2015-10-10 – 2015-10-11 (×2): 10 mg via INTRAVENOUS
  Filled 2015-10-10 (×2): qty 1

## 2015-10-10 NOTE — Progress Notes (Signed)
Pt now hypertensive, BP 165/79 at 0200, repeated at 0215 to verify, BP 164/82. Notified Dr. Jimmy Footman, no new orders received at this time, will re-notify if SBP >170 and continue to monitor closely.

## 2015-10-10 NOTE — Progress Notes (Signed)
PULMONARY / CRITICAL CARE MEDICINE   Name: ELLAJANE EYESTONE MRN: [id number] DOB: April 18, 1936    ADMISSION DATE:  10/06/2015 CONSULTATION DATE: 10/09/15  REFERRING MD: Clide Dales   CHIEF COMPLAINT: Complaints of tingling in her right arm and leg which  HISTORY OF PRESENT ILLNESS:  Cynthia [name] is a 80 y.o. White  female with metastatic colon cancer, status post right colectomy and chemotherapy 2015. In December patient was found to have liver lesions, biopsy was performed and  Confirmed to have metastatic colon cancer. She underwent ablation of the liver lesions on the tenth of this month. Patient presented to the emergency department on 10/06/15 with complaints of tingling in her right arm and leg which started previous night that  .   .On 10/07/15 patient was unable to stand due   to poor balance and weakness of her lower extremities. Patient had a stroke a few years ago. She was left with some left-sided weakness but normally walks around independently.  Patient continues to feel weak and tired, numbness and tingling of upper extrimity and neurology was following her  On 10/09/15  Patient was found to be Hypotensive,85/50  And was as low as 65/44and was transferred to the ICU for closer monitoring.   SUBJECTIVE:  Awake and in no distress.   VITAL SIGNS: BP 176/79 mmHg  Pulse 83  Temp(Src) 97.5 F (36.4 C) (Oral)  Resp 11  Ht 5\' 2"  (1.575 m)  Wt 136 lb 14.4 oz (62.097 kg)  BMI 25.03 kg/m2  SpO2 100%  HEMODYNAMICS:    VENTILATOR SETTINGS:   INTAKE / OUTPUT: I/O last 3 completed shifts: In: [id number] [I.V.:6314; NG/GT:90] Out: 2050 [Urine:1730; Emesis/NG output:320] PHYSICAL EXAMINATION: General:  Elderly white female, no distress.   Neuro:  Awake, alert, oriented, follows commands HEENT: Atraumatic, normocephalic Cardiovascular: SI,S2, rrr, no MRG Lungs: Clear,no use of accessory muscle Abdomen: Soft, nontender Musculoskeletal: normal muscle tone   Skin:Intact  LABS:  BMET  Recent Labs Lab 10/06/15 0612 10/06/15 0620 10/07/15 1041 10/09/15 1030  NA 139 140 138 130*  K 4.2 4.2 4.0 3.6  CL 104 101 99* 97*  CO2 25  --  25 21*  BUN 8 9 13 13   CREATININE 0.62 0.60 0.65 1.01*  GLUCOSE 109* 105* 150* 201*    Electrolytes  Recent Labs Lab 10/06/15 0612 10/07/15 1041 10/08/15 0521 10/09/15 1030  CALCIUM 9.1 9.7  --  8.4*  MG  --   --  1.9  --     CBC  Recent Labs Lab 10/06/15 0612 10/06/15 0620 10/09/15 1030 10/09/15 1400  WBC 7.5  --  19.2* 15.8*  HGB 12.0 14.3 10.7* 10.0*  HCT 36.7 42.0 31.6* 29.7*  PLT 332  --  525* 458*    Coag's  Recent Labs Lab 10/06/15 0612  APTT 28  INR 1.19    Sepsis Markers  Recent Labs Lab 10/09/15 1047 10/09/15 1343  LATICACIDVEN 3.9* 1.7    ABG  Recent Labs Lab 10/09/15 1123  PHART 7.354  PCO2ART 33.0*  PO2ART 59.0*    Liver Enzymes  Recent Labs Lab 10/06/15 0612 10/07/15 1041 10/09/15 1030  AST 31 30 36  ALT 36 32 24  ALKPHOS 46 50 40  BILITOT 0.2* 0.5 0.5  ALBUMIN 3.1* 3.1* 2.5*    Cardiac Enzymes No results for input(s): TROPONINI, PROBNP in the last 168 hours.  Glucose No results for input(s): GLUCAP in the last 168 hours.  Imaging No results found.   STUDIES:  MRI head wo contrast >2/22 was indicative ofNo evidence of large vessel occlusion or significant proximal stenosis. Moderate branch vessel irregularity suggestive of Atherosclerosis MR Brain wo contrast>2/21 indicative ofAtrophy and chronic microvascular ischemia  CThead wo contrast>2/211 was negative  CULTURES: None  ANTIBIOTICS: None  SIGNIFICANT EVENTS: ED> 2/21 ICU> 2/22  LINES/TUBES: Central line> 10/09/15   ASSESSMENT / PLAN:  Hx of metastatic colon cancer; S/P right colectomy and chemo(2015) Liver lesion Acute upper GI bleed -->hgb stable  Plan:   Cont NG to LIWS Cont PPI Decision for endoscopy per GI  Acute onset of gate ataia and  paraesthesia. To date etiology unclear.  CT neg for CVA. ? Atypical GBS vs paraneoplastic encephalomyelitis.. She is more awake today but remains weak.  Plan:   Neurology following Given no clear dx would consider LP first prior to IVIG but if neurology feels otherwise there no strong contra-indication from a critical care stand-point to go forward with this  Discussion Cynthia [name] is a 80 y.o. White  female with metastatic colon cancer, status post right colectomy and chemotherapy 2015. She came eo the ED on  10/06/15 with left sided  Numbness, tingling and increased weakness .  She was moved to ICU on 10/09/15 with hypotension  For closer monitoring. Stable over night. Will move her to the SDU. GI has recommended keeping NGT to LIWS and cont PPI. They will determine EGD timing. From a CCM stand-point no strong contra-indication to IVIG but having LP for actual dx would be helpful. She is stable to move from the ICU to the step-down setting. We will s/o. The medical team will assume care effective in the am.   Erick Colace ACNP-BC [place] Pager # [phone] OR # [phone] if no answer     10/10/2015 12:58 PM

## 2015-10-10 NOTE — Progress Notes (Signed)
Pt restless and pulling at hands, states she has been having tactile hallucinations on her hands since being given morphine yesterday. Pt is A&O x 4, ICU-CAM negative. Pt states she knows the hallucinations are not real, but cannot "get rid of them". Will continue to monitor closely.

## 2015-10-10 NOTE — Progress Notes (Addendum)
Subjective: States that her leg strength is somewhat improved subjectively since yesterday. Experiencing formed tactile sensations with a feeling as if her hands are encased in a sticky glue - the hallucination becomes worse with hand movement. No visual component to this hallucination, although she has also had visual hallucinations this admission, felt by patient to most likely be due to opiate medication.   Objective: Current vital signs: BP 151/75 mmHg  Pulse 73  Temp(Src) 96.5 F (35.8 C) (Oral)  Resp 21  Ht 5' 2"  (1.575 m)  Wt 62.097 kg (136 lb 14.4 oz)  BMI 25.03 kg/m2  SpO2 99% Vital signs in last 24 hours: Temp:  [96.5 F (35.8 C)-98.4 F (36.9 C)] 96.5 F (35.8 C) (02/25 0900) Pulse Rate:  [66-94] 73 (02/25 0900) Resp:  [12-25] 21 (02/25 0900) BP: (65-178)/(50-108) 151/75 mmHg (02/25 0900) SpO2:  [93 %-100 %] 99 % (02/25 0900)  Intake/Output from previous day: 02/24 0701 - 02/25 0700 In: 6404 [I.V.:6314; NG/GT:90] Out: 2050 [Urine:1730; Emesis/NG output:320] Intake/Output this shift: Total I/O In: 170 [I.V.:170] Out: 200 [Urine:200] Nutritional status:    Neurologic Exam: Mental Status: Awake, oriented, thought content appropriate. Speech fluent without evidence of aphasia. Good insight. Able to follow all commands without difficulty. Appears fatigued. Cranial Nerves: II: Fixates and tracks normally. III,IV, VI: ptosis not present, extra-ocular motions intact bilaterally V: Decreased sensation to left face. VII: No facial droop VIII: hearing intact to conversation IX,X: No hypophonia or hoarseness noted. XI: bilateral shoulder shrug symmetric XII: midline tongue extension Motor: 4/5 strength bilateral upper extremities.  LLE: 2/5 hip flexion, 4/5 knee extension, 3/5 ankle dorsiflexion. RLE: 2/5 hip flexion, 2/5 knee extension, 2/5 ankle dorsiflexion. Bilateral lower extremity motor findings with some inconsistency most likely secondary to effort-dependence  and fatigueability. After a short rest strength improves somewhat but still with significant weakness.  Sensory: Severe loss of vibration sensation and proprioception to toes and ankles. Moderately decreased vibration sensation at knees. Decreased temperature sensation to right leg sparing the foot. Decreased temperature left forearm. Decreased fine touch sensation bilateral lower extremities from feet to hip girdle region with no clearly defined sensory level.  Deep Tendon Reflexes: 1+ bilateral brachioradialis. Trace reflexes at biceps. 0 at patellae and achilles bilaterally.  Plantars: Mute.  Cerebellar: Dysmetria with left finger to nose.  Gait: Unable to ambulate.   Lab Results: Basic Metabolic Panel:  Recent Labs Lab 10/06/15 0612 10/06/15 0620 10/07/15 1041 10/08/15 0521 10/09/15 1030  NA 139 140 138  --  130*  K 4.2 4.2 4.0  --  3.6  CL 104 101 99*  --  97*  CO2 25  --  25  --  21*  GLUCOSE 109* 105* 150*  --  201*  BUN 8 9 13   --  13  CREATININE 0.62 0.60 0.65  --  1.01*  CALCIUM 9.1  --  9.7  --  8.4*  MG  --   --   --  1.9  --     Liver Function Tests:  Recent Labs Lab 10/06/15 0612 10/07/15 1041 10/09/15 1030  AST 31 30 36  ALT 36 32 24  ALKPHOS 46 50 40  BILITOT 0.2* 0.5 0.5  PROT 6.7 7.4 5.8*  ALBUMIN 3.1* 3.1* 2.5*   No results for input(s): LIPASE, AMYLASE in the last 168 hours. No results for input(s): AMMONIA in the last 168 hours.  CBC:  Recent Labs Lab 10/06/15 0612 10/06/15 0620 10/09/15 1030 10/09/15 1400  WBC 7.5  --  19.2* 15.8*  NEUTROABS 5.8  --   --   --   HGB 12.0 14.3 10.7* 10.0*  HCT 36.7 42.0 31.6* 29.7*  MCV 89.5  --  86.3 86.6  PLT 332  --  525* 458*    Cardiac Enzymes:  Recent Labs Lab 10/07/15 1041  CKTOTAL 81    Lipid Panel:  Recent Labs Lab 10/07/15 0555  CHOL 149  TRIG 56  HDL 39*  CHOLHDL 3.8  VLDL 11  LDLCALC 99    CBG: No results for input(s): GLUCAP in the last 168  hours.  Microbiology: Results for orders placed or performed during the hospital encounter of 10/06/15  MRSA PCR Screening     Status: None   Collection Time: 10/09/15 12:58 PM  Result Value Ref Range Status   MRSA by PCR NEGATIVE NEGATIVE Final    Comment:        The GeneXpert MRSA Assay (FDA approved for NASAL specimens only), is one component of a comprehensive MRSA colonization surveillance program. It is not intended to diagnose MRSA infection nor to guide or monitor treatment for MRSA infections.     Coagulation Studies: No results for input(s): LABPROT, INR in the last 72 hours.  Imaging: Mr Cervical Spine W Wo Contrast  10/08/2015  CLINICAL DATA:  80 year old female with ataxia, loss of balance and lower extremity weakness with bilateral upper and lower extremity paresthesia, acute onset. Metastatic colon cancer. Subsequent encounter. EXAM: MRI CERVICAL SPINE WITHOUT AND WITH CONTRAST TECHNIQUE: Multiplanar and multiecho pulse sequences of the cervical spine, to include the craniocervical junction and cervicothoracic junction, were obtained according to standard protocol without and with intravenous contrast. CONTRAST:  74m MULTIHANCE GADOBENATE DIMEGLUMINE 529 MG/ML IV SOLN COMPARISON:  Brain MRI 10/06/2015.  PET-CT 07/28/2015 FINDINGS: Straightening of cervical lordosis. Multilevel cervical degenerative endplate changes. There is trace superior endplate marrow edema anteriorly at C7 which appears degenerative in nature. No marrow lesion in the cervical spine suspicious for metastatic disease. Visible bone marrow signal at the skullbase appears stable and within normal limits. Cervicomedullary junction is within normal limits. Negative visualized posterior fossa structures. Spinal cord signal is within normal limits at all visualized levels. No abnormal cervical intradural enhancement. Negative paraspinal soft tissues. Cervical spine degenerative changes: C2-C3: Mild to moderate  facet hypertrophy greater on the right. Borderline to mild right C3 foraminal stenosis. C3-C4: Disc space loss with circumferential disc osteophyte complex eccentric to the right. Broad-based posterior component of disc. Narrowing of the ventral CSF space without spinal stenosis. Moderate to severe uncovertebral and facet hypertrophy on the right. Mild left and severe right C4 foraminal stenosis. C4-C5: Trace anterolisthesis with moderate to severe facet hypertrophy greater on the right. Mild disc bulge. No spinal stenosis. Moderate right C5 foraminal stenosis. C5-C6: Disc space loss. Circumferential disc osteophyte complex broad-based posterior component of disc. Mild ligament flavum hypertrophy. Borderline to mild spinal stenosis. Uncovertebral hypertrophy with moderate bilateral C6 foraminal stenosis. C6-C7: Disc space loss. Circumferential disc osteophyte complex. Broad-based posterior component of disc eccentric to the left. Uncovertebral hypertrophy greater on the left. Mild ligament flavum hypertrophy. Borderline to mild spinal stenosis. Severe left C7 foraminal stenosis. C7-T1: Moderate facet hypertrophy.  No stenosis. No upper thoracic spinal stenosis. IMPRESSION: 1. No metastatic disease identified in the cervical spine. Mild degenerative appearing endplate marrow edema at C7. 2. Multilevel disc, endplate, and facet degeneration in the cervical spine. 3. Subsequent borderline to mild spinal stenosis at C5-C6 and C6-C7 with no spinal cord mass effect  or signal abnormality. 4. Subsequent moderate or severe neural foraminal stenosis at the right C4, right C5, bilateral C6, and left C7 nerve levels. Electronically Signed   By: Genevie Ann M.D.   On: 10/08/2015 17:39   Dg Chest Port 1 View  10/09/2015  CLINICAL DATA:  Central catheter placement EXAM: PORTABLE CHEST 1 VIEW COMPARISON:  October 09, 2015 study obtained earlier in the day FINDINGS: Central catheter tip is in the superior vena cava. Nasogastric tube  tip and side port are below the diaphragm with the side port seen in the stomach. No pneumothorax. There is atelectatic change in the bases, slightly more on the left than on the right. Lungs elsewhere clear. Heart size and pulmonary vascularity are normal. No adenopathy. No bone lesions. IMPRESSION: Tube and catheter positions as described without pneumothorax. Bibasilar atelectasis. No consolidation. No change in cardiac silhouette. Electronically Signed   By: Lowella Grip III M.D.   On: 10/09/2015 12:43   Dg Chest Port 1 View  10/09/2015  CLINICAL DATA:  Hemoptysis EXAM: PORTABLE CHEST 1 VIEW COMPARISON:  10/06/2015 FINDINGS: Cardiomediastinal silhouette is stable. Elevation of the right hemidiaphragm again noted. Again noted triangular shape atelectasis in right middle lobe/ infrahilar region. There is new linear atelectasis left base medially. No segmental infiltrate or pulmonary edema. IMPRESSION: Elevation of the right hemidiaphragm again noted. Again noted triangular shape atelectasis in right middle lobe/ infrahilar region. There is new linear atelectasis left base medially. No segmental infiltrate or pulmonary edema. Electronically Signed   By: Lahoma Crocker M.D.   On: 10/09/2015 10:24    Medications: Medication list reviewed.   Assessment/Plan: 1. Acute onset of gait ataxia with dizziness, nausea, numbness and tingling of feet. Exam in ED revealed no lateralized motor/sensory deficit or ataxia. MRI revealed no acute infarction. DDx included small cerebellar TIA, atypical presentation of Guillain-Barre syndrome. Thiamine and B12 levels drawn this admission are normal. States that her leg strength is somewhat improved subjectively since yesterday. On exam there is bilateral lower extremity weakness with some inconsistency most likely secondary to effort-dependence and fatigueability. After a short rest strength improves somewhat but still with significant weakness.  2. DDx also includes  paraneoplastic encephalomyelitis, which, although rare, should be further evaluated with LP and MRI thoracic and lumbar spine given continued lower extremity weakness and sensory level at T12 seen on today's exam. A distinct sensory level was not seen on initial exam this admission, suggestive of possible transverse myelitis.  3. Severely impaired proprioception and vibration sensation of lower extremities. Also on DDx for item #2 sensory findings would be a length-dependent axonal neuropathy secondary to chemotherapy. GBS possible and higher on the DDx given gradually worsening lower extremity motor strength, although fatigue from recent blood loss could play a role.   4. MRI of cervical spine did not reveal a structural explanation for her lower extremity weakness.  5. Upper GI bleed. Hgb level yesterday at 2 PM following stabilization was 10.   6. Stated prior history of stroke. Felt by patient to be the etiology for the LUE ataxia seen on exam, which she states is chronic since her stroke. No definite chronic stroke seen on MRI. Possible MRI negative cerebral infarction (rare) versus paraneoplastic syndrome for her LUE ataxia (also on DDx for her acute onset of lower extremity ataxia precipitating this admission).  7. Experiencing formed tactile sensations with a feeling as if her hands are encased in a sticky glue - the hallucination becomes worse with hand movement.  No visual component to this hallucination, although she has also had visual hallucinations this admission, felt by patient to most likely be due to opiate medication. Also compatible with a paraneoplastic encephalitis.   Recommendations: 1. Now appears stable enough to obtain MRI of thoracic and lumbar spine with and without contrast, which is necessary to rule out intrathecal metastatic lesion, vertebral fracture with compression of the cauda equina, transverse myelitis or other structural abnormality that could explain her lower  extremity weakness.  2. Given areflexia on lower extremity exam and hypoactive upper extremity reflexes, potential benefits of a 5 trial of IVIG for possible GBS may outweigh risks. If ICU team feels that this is safe given her other comorbidities, administer IVIG at 0.4g/kg qd x 5 days.   3. Order serum anti-voltage-gated calcium channel antibody titer to assess for possible Lambert-Eaton myasthenic syndrome (a paraneoplastic syndrome resulting in weakness secondary to NMJ defect).  4. When stable and Plavix has been discontinued for 3-5 days, obtain LP. Labs should include cell count, protein, albumin, glucose, IgG and oligoclonal bands. Concomitant blood draw for serum IgG and albumin levels should also be obtained to calculate intrathecal IgG synthesis rate.  5. ESR, C-reactive protein, P-ANCA and C-ANCA. Case reports document these as being positive in rare cases of paraneoplastic neuropathy secondary to colon adenocarcinoma. A 6. Agree with holding Plavix. When stable and no further bleeding, secondary stroke prevention should be with ASA. 7. Will need outpatient EMG/NCS to further assess whether the distal neuropathy is axonal, demyelinating or both. Also to assess if there is a NMJ defect since paraneoplastic syndrome is possible given her diagnosis of colon cancer.   A total of 35 minutes were spent in the care of this critically ill patient today, including bedside evaluation and coordination of care.   Kerney Elbe, MD  10/10/2015, 9:16 AM

## 2015-10-10 NOTE — Progress Notes (Signed)
[facility] Progress Note  Subjective: Became hypotensive briefly last night having a lot of hallucinations, later became hypertensive. NG tube draining very small amount of coffee-ground material overnight  Objective: Vital signs in last 24 hours: Temp:  [97.4 F (36.3 C)-98.4 F (36.9 C)] 98.4 F (36.9 C) (02/25 0338) Pulse Rate:  [66-94] 89 (02/25 0700) Resp:  [12-25] 21 (02/25 0700) BP: (65-178)/(50-108) 178/91 mmHg (02/25 0700) SpO2:  [93 %-100 %] 100 % (02/25 0700) Weight change:    PE: Unchanged  Lab Results: Results for orders placed or performed during the hospital encounter of 10/06/15 (from the past 24 hour(s))  Comprehensive metabolic panel     Status: Abnormal   Collection Time: 10/09/15 10:30 AM  Result Value Ref Range   Sodium 130 (L) 135 - 145 mmol/L   Potassium 3.6 3.5 - 5.1 mmol/L   Chloride 97 (L) 101 - 111 mmol/L   CO2 21 (L) 22 - 32 mmol/L   Glucose, Bld 201 (H) 65 - 99 mg/dL   BUN 13 6 - 20 mg/dL   Creatinine, Ser 1.01 (H) 0.44 - 1.00 mg/dL   Calcium 8.4 (L) 8.9 - 10.3 mg/dL   Total Protein 5.8 (L) 6.5 - 8.1 g/dL   Albumin 2.5 (L) 3.5 - 5.0 g/dL   AST 36 15 - 41 U/L   ALT 24 14 - 54 U/L   Alkaline Phosphatase 40 38 - 126 U/L   Total Bilirubin 0.5 0.3 - 1.2 mg/dL   GFR calc non Af Amer 52 (L) >60 mL/min   GFR calc Af Amer >60 >60 mL/min   Anion gap 12 5 - 15  CBC     Status: Abnormal   Collection Time: 10/09/15 10:30 AM  Result Value Ref Range   WBC 19.2 (H) 4.0 - 10.5 K/uL   RBC 3.66 (L) 3.87 - 5.11 MIL/uL   Hemoglobin 10.7 (L) 12.0 - 15.0 g/dL   HCT 31.6 (L) 36.0 - 46.0 %   MCV 86.3 78.0 - 100.0 fL   MCH 29.2 26.0 - 34.0 pg   MCHC 33.9 30.0 - 36.0 g/dL   RDW 13.5 11.5 - 15.5 %   Platelets 525 (H) 150 - 400 K/uL  Lactic acid, plasma     Status: Abnormal   Collection Time: 10/09/15 10:47 AM  Result Value Ref Range   Lactic Acid, Venous 3.9 (HH) 0.5 - 2.0 mmol/L  Type and screen [place]     Status: None   Collection Time: 10/09/15 11:00 AM  Result Value Ref Range   ABO/RH(D) A POS    Antibody Screen NEG    Sample Expiration 10/12/2015   ABO/Rh     Status: None   Collection Time: 10/09/15 11:00 AM  Result Value Ref Range   ABO/RH(D) A POS   I-STAT 3, arterial blood gas (G3+)     Status: Abnormal   Collection Time: 10/09/15 11:23 AM  Result Value Ref Range   pH, Arterial 7.354 7.350 - 7.450   pCO2 arterial 33.0 (L) 35.0 - 45.0 mmHg   pO2, Arterial 59.0 (L) 80.0 - 100.0 mmHg   Bicarbonate 18.5 (L) 20.0 - 24.0 mEq/L   TCO2 20 0 - 100 mmol/L   O2 Saturation 90.0 %   Acid-base deficit 6.0 (H) 0.0 - 2.0 mmol/L   Patient temperature 97.5 F    Collection site RADIAL, ALLEN'S TEST ACCEPTABLE    Drawn by Operator    Sample type ARTERIAL   MRSA PCR Screening  Status: None   Collection Time: 10/09/15 12:58 PM  Result Value Ref Range   MRSA by PCR NEGATIVE NEGATIVE  Lactic acid, plasma     Status: None   Collection Time: 10/09/15  1:43 PM  Result Value Ref Range   Lactic Acid, Venous 1.7 0.5 - 2.0 mmol/L  CBC     Status: Abnormal   Collection Time: 10/09/15  2:00 PM  Result Value Ref Range   WBC 15.8 (H) 4.0 - 10.5 K/uL   RBC 3.43 (L) 3.87 - 5.11 MIL/uL   Hemoglobin 10.0 (L) 12.0 - 15.0 g/dL   HCT 29.7 (L) 36.0 - 46.0 %   MCV 86.6 78.0 - 100.0 fL   MCH 29.2 26.0 - 34.0 pg   MCHC 33.7 30.0 - 36.0 g/dL   RDW 13.4 11.5 - 15.5 %   Platelets 458 (H) 150 - 400 K/uL    Studies/Results: Mr Cervical Spine W Wo Contrast  10/08/2015  CLINICAL DATA:  80 year old female with ataxia, loss of balance and lower extremity weakness with bilateral upper and lower extremity paresthesia, acute onset. Metastatic colon cancer. Subsequent encounter. EXAM: MRI CERVICAL SPINE WITHOUT AND WITH CONTRAST TECHNIQUE: Multiplanar and multiecho pulse sequences of the cervical spine, to include the craniocervical junction and cervicothoracic junction, were obtained according to standard protocol without and with  intravenous contrast. CONTRAST:  93mL MULTIHANCE GADOBENATE DIMEGLUMINE 529 MG/ML IV SOLN COMPARISON:  Brain MRI 10/06/2015.  PET-CT 07/28/2015 FINDINGS: Straightening of cervical lordosis. Multilevel cervical degenerative endplate changes. There is trace superior endplate marrow edema anteriorly at C7 which appears degenerative in nature. No marrow lesion in the cervical spine suspicious for metastatic disease. Visible bone marrow signal at the skullbase appears stable and within normal limits. Cervicomedullary junction is within normal limits. Negative visualized posterior fossa structures. Spinal cord signal is within normal limits at all visualized levels. No abnormal cervical intradural enhancement. Negative paraspinal soft tissues. Cervical spine degenerative changes: C2-C3: Mild to moderate facet hypertrophy greater on the right. Borderline to mild right C3 foraminal stenosis. C3-C4: Disc space loss with circumferential disc osteophyte complex eccentric to the right. Broad-based posterior component of disc. Narrowing of the ventral CSF space without spinal stenosis. Moderate to severe uncovertebral and facet hypertrophy on the right. Mild left and severe right C4 foraminal stenosis. C4-C5: Trace anterolisthesis with moderate to severe facet hypertrophy greater on the right. Mild disc bulge. No spinal stenosis. Moderate right C5 foraminal stenosis. C5-C6: Disc space loss. Circumferential disc osteophyte complex broad-based posterior component of disc. Mild ligament flavum hypertrophy. Borderline to mild spinal stenosis. Uncovertebral hypertrophy with moderate bilateral C6 foraminal stenosis. C6-C7: Disc space loss. Circumferential disc osteophyte complex. Broad-based posterior component of disc eccentric to the left. Uncovertebral hypertrophy greater on the left. Mild ligament flavum hypertrophy. Borderline to mild spinal stenosis. Severe left C7 foraminal stenosis. C7-T1: Moderate facet hypertrophy.  No  stenosis. No upper thoracic spinal stenosis. IMPRESSION: 1. No metastatic disease identified in the cervical spine. Mild degenerative appearing endplate marrow edema at C7. 2. Multilevel disc, endplate, and facet degeneration in the cervical spine. 3. Subsequent borderline to mild spinal stenosis at C5-C6 and C6-C7 with no spinal cord mass effect or signal abnormality. 4. Subsequent moderate or severe neural foraminal stenosis at the right C4, right C5, bilateral C6, and left C7 nerve levels. Electronically Signed   By: Genevie Ann M.D.   On: 10/08/2015 17:39   Dg Chest Port 1 View  10/09/2015  CLINICAL DATA:  Central catheter placement EXAM:  PORTABLE CHEST 1 VIEW COMPARISON:  October 09, 2015 study obtained earlier in the day FINDINGS: Central catheter tip is in the superior vena cava. Nasogastric tube tip and side port are below the diaphragm with the side port seen in the stomach. No pneumothorax. There is atelectatic change in the bases, slightly more on the left than on the right. Lungs elsewhere clear. Heart size and pulmonary vascularity are normal. No adenopathy. No bone lesions. IMPRESSION: Tube and catheter positions as described without pneumothorax. Bibasilar atelectasis. No consolidation. No change in cardiac silhouette. Electronically Signed   By: Lowella Grip III M.D.   On: 10/09/2015 12:43   Dg Chest Port 1 View  10/09/2015  CLINICAL DATA:  Hemoptysis EXAM: PORTABLE CHEST 1 VIEW COMPARISON:  10/06/2015 FINDINGS: Cardiomediastinal silhouette is stable. Elevation of the right hemidiaphragm again noted. Again noted triangular shape atelectasis in right middle lobe/ infrahilar region. There is new linear atelectasis left base medially. No segmental infiltrate or pulmonary edema. IMPRESSION: Elevation of the right hemidiaphragm again noted. Again noted triangular shape atelectasis in right middle lobe/ infrahilar region. There is new linear atelectasis left base medially. No segmental infiltrate  or pulmonary edema. Electronically Signed   By: Lahoma Crocker M.D.   On: 10/09/2015 10:24      Assessment: Coffee-ground emesis appears to represent a non-destabilizing upper GI bleed History of colon cancer with liver metastases  Plan: Continue NG suction IV PPI and monitor stools and hemoglobin, may need an endoscopy in the next day or 2.    Raveena Hebdon C 10/10/2015, 8:19 AM  Pager [phone] If no answer or after 5 PM call [phone]

## 2015-10-10 NOTE — Progress Notes (Signed)
PRN orders received for IV Labetalol. Will administer and continue to monitor.

## 2015-10-10 NOTE — Progress Notes (Signed)
Pt oliguric since beginning of shift, bladder scan showed 46 cc. eLink notified, no new orders received at this time. Will continue to monitor closely.

## 2015-10-11 ENCOUNTER — Inpatient Hospital Stay (HOSPITAL_COMMUNITY): Payer: Medicare Other

## 2015-10-11 DIAGNOSIS — J9600 Acute respiratory failure, unspecified whether with hypoxia or hypercapnia: Secondary | ICD-10-CM | POA: Insufficient documentation

## 2015-10-11 LAB — CBC WITH DIFFERENTIAL/PLATELET
Basophils Absolute: 0 10*3/uL (ref 0.0–0.1)
Basophils Relative: 0 %
EOS ABS: 0 10*3/uL (ref 0.0–0.7)
EOS PCT: 0 %
HCT: 25.4 % — ABNORMAL LOW (ref 36.0–46.0)
Hemoglobin: 8.4 g/dL — ABNORMAL LOW (ref 12.0–15.0)
LYMPHS ABS: 1.2 10*3/uL (ref 0.7–4.0)
Lymphocytes Relative: 7 %
MCH: 28.3 pg (ref 26.0–34.0)
MCHC: 33.1 g/dL (ref 30.0–36.0)
MCV: 85.5 fL (ref 78.0–100.0)
MONOS PCT: 5 %
Monocytes Absolute: 0.8 10*3/uL (ref 0.1–1.0)
Neutro Abs: 15.3 10*3/uL — ABNORMAL HIGH (ref 1.7–7.7)
Neutrophils Relative %: 88 %
PLATELETS: 455 10*3/uL — AB (ref 150–400)
RBC: 2.97 MIL/uL — AB (ref 3.87–5.11)
RDW: 14 % (ref 11.5–15.5)
WBC: 17.4 10*3/uL — AB (ref 4.0–10.5)

## 2015-10-11 LAB — POCT I-STAT 3, ART BLOOD GAS (G3+)
Acid-Base Excess: 2 mmol/L (ref 0.0–2.0)
Bicarbonate: 26.6 mEq/L — ABNORMAL HIGH (ref 20.0–24.0)
O2 SAT: 100 %
PCO2 ART: 39.1 mmHg (ref 35.0–45.0)
PH ART: 7.437 (ref 7.350–7.450)
TCO2: 28 mmol/L (ref 0–100)
pO2, Arterial: 393 mmHg — ABNORMAL HIGH (ref 80.0–100.0)

## 2015-10-11 LAB — COMPREHENSIVE METABOLIC PANEL
ALT: 24 U/L (ref 14–54)
ANION GAP: 13 (ref 5–15)
AST: 37 U/L (ref 15–41)
Albumin: 2.5 g/dL — ABNORMAL LOW (ref 3.5–5.0)
Alkaline Phosphatase: 53 U/L (ref 38–126)
BILIRUBIN TOTAL: 0.8 mg/dL (ref 0.3–1.2)
BUN: 15 mg/dL (ref 6–20)
CO2: 22 mmol/L (ref 22–32)
Calcium: 8.6 mg/dL — ABNORMAL LOW (ref 8.9–10.3)
Chloride: 99 mmol/L — ABNORMAL LOW (ref 101–111)
Creatinine, Ser: 0.53 mg/dL (ref 0.44–1.00)
Glucose, Bld: 136 mg/dL — ABNORMAL HIGH (ref 65–99)
POTASSIUM: 3.4 mmol/L — AB (ref 3.5–5.1)
Sodium: 134 mmol/L — ABNORMAL LOW (ref 135–145)
TOTAL PROTEIN: 5.8 g/dL — AB (ref 6.5–8.1)

## 2015-10-11 LAB — TROPONIN I
TROPONIN I: 0.17 ng/mL — AB (ref <0.031)
TROPONIN I: 0.2 ng/mL — AB (ref <0.031)
Troponin I: 0.22 ng/mL — ABNORMAL HIGH (ref <0.031)

## 2015-10-11 LAB — TSH: TSH: 0.676 u[IU]/mL (ref 0.350–4.500)

## 2015-10-11 LAB — MAGNESIUM: MAGNESIUM: 1.8 mg/dL (ref 1.7–2.4)

## 2015-10-11 LAB — PROCALCITONIN: PROCALCITONIN: 0.96 ng/mL

## 2015-10-11 LAB — LACTIC ACID, PLASMA: LACTIC ACID, VENOUS: 1.5 mmol/L (ref 0.5–2.0)

## 2015-10-11 MED ORDER — FENTANYL CITRATE (PF) 100 MCG/2ML IJ SOLN
50.0000 ug | INTRAMUSCULAR | Status: AC | PRN
  Administered 2015-10-11 (×2): 25 ug via INTRAVENOUS
  Administered 2015-10-11: 50 ug via INTRAVENOUS
  Filled 2015-10-11 (×2): qty 2

## 2015-10-11 MED ORDER — GADOBENATE DIMEGLUMINE 529 MG/ML IV SOLN
13.0000 mL | Freq: Once | INTRAVENOUS | Status: AC | PRN
  Administered 2015-10-11: 13 mL via INTRAVENOUS

## 2015-10-11 MED ORDER — DEXTROSE 5 % IV SOLN
30.0000 ug/min | INTRAVENOUS | Status: DC
  Administered 2015-10-11: 40 ug/min via INTRAVENOUS
  Filled 2015-10-11 (×2): qty 1

## 2015-10-11 MED ORDER — PIPERACILLIN-TAZOBACTAM 3.375 G IVPB 30 MIN
3.3750 g | Freq: Once | INTRAVENOUS | Status: AC
  Administered 2015-10-11: 3.375 g via INTRAVENOUS
  Filled 2015-10-11: qty 50

## 2015-10-11 MED ORDER — SODIUM CHLORIDE 0.9 % IV BOLUS (SEPSIS)
750.0000 mL | Freq: Once | INTRAVENOUS | Status: AC
  Administered 2015-10-11: 750 mL via INTRAVENOUS

## 2015-10-11 MED ORDER — IMMUNE GLOBULIN (HUMAN) 5 GM/50ML IV SOLN
400.0000 mg/kg | INTRAVENOUS | Status: DC
  Administered 2015-10-11 – 2015-10-15 (×4): 25 g via INTRAVENOUS
  Filled 2015-10-11 (×9): qty 50

## 2015-10-11 MED ORDER — DEXMEDETOMIDINE HCL IN NACL 200 MCG/50ML IV SOLN
0.0000 ug/kg/h | INTRAVENOUS | Status: DC
Start: 2015-10-11 — End: 2015-10-11
  Administered 2015-10-11: 0.6 ug/kg/h via INTRAVENOUS
  Filled 2015-10-11: qty 50

## 2015-10-11 MED ORDER — VANCOMYCIN HCL 500 MG IV SOLR
500.0000 mg | Freq: Two times a day (BID) | INTRAVENOUS | Status: DC
  Administered 2015-10-11 – 2015-10-13 (×5): 500 mg via INTRAVENOUS
  Filled 2015-10-11 (×7): qty 500

## 2015-10-11 MED ORDER — FENTANYL CITRATE (PF) 100 MCG/2ML IJ SOLN
INTRAMUSCULAR | Status: AC
  Administered 2015-10-11: 100 ug
  Filled 2015-10-11: qty 4

## 2015-10-11 MED ORDER — CHLORHEXIDINE GLUCONATE 0.12% ORAL RINSE (MEDLINE KIT)
15.0000 mL | Freq: Two times a day (BID) | OROMUCOSAL | Status: DC
  Administered 2015-10-11 – 2015-10-15 (×9): 15 mL via OROMUCOSAL

## 2015-10-11 MED ORDER — VANCOMYCIN HCL 10 G IV SOLR
1250.0000 mg | Freq: Once | INTRAVENOUS | Status: AC
  Administered 2015-10-11: 1250 mg via INTRAVENOUS
  Filled 2015-10-11: qty 1250

## 2015-10-11 MED ORDER — FENTANYL CITRATE (PF) 100 MCG/2ML IJ SOLN
50.0000 ug | INTRAMUSCULAR | Status: DC | PRN
  Administered 2015-10-11 – 2015-10-14 (×11): 50 ug via INTRAVENOUS
  Filled 2015-10-11 (×12): qty 2

## 2015-10-11 MED ORDER — ETOMIDATE 2 MG/ML IV SOLN
20.0000 mg | Freq: Once | INTRAVENOUS | Status: AC
  Administered 2015-10-11: 20 mg via INTRAVENOUS

## 2015-10-11 MED ORDER — MIDAZOLAM HCL 2 MG/2ML IJ SOLN
INTRAMUSCULAR | Status: AC
  Administered 2015-10-11: 2 mg
  Filled 2015-10-11: qty 4

## 2015-10-11 MED ORDER — PIPERACILLIN-TAZOBACTAM 3.375 G IVPB
3.3750 g | Freq: Three times a day (TID) | INTRAVENOUS | Status: DC
  Administered 2015-10-11 – 2015-10-17 (×18): 3.375 g via INTRAVENOUS
  Filled 2015-10-11 (×19): qty 50

## 2015-10-11 MED ORDER — ANTISEPTIC ORAL RINSE SOLUTION (CORINZ)
7.0000 mL | Freq: Four times a day (QID) | OROMUCOSAL | Status: DC
  Administered 2015-10-11 – 2015-10-15 (×16): 7 mL via OROMUCOSAL

## 2015-10-11 NOTE — Progress Notes (Signed)
PULMONARY / CRITICAL CARE MEDICINE   Name: Cynthia [name] MRN: [id number] DOB: 1936/03/06    ADMISSION DATE:  10/06/2015 CONSULTATION DATE: 10/09/15  REFERRING MD: Clide Dales   CHIEF COMPLAINT: Complaints of tingling in her right arm and leg which  HISTORY OF PRESENT ILLNESS:  Cynthia [name] is a 80 y.o. White  female with metastatic colon cancer, status post right colectomy and chemotherapy 2015. In December patient was found to have liver lesions, biopsy was performed and  Confirmed to have metastatic colon cancer. She underwent ablation of the liver lesions on the tenth of this month. Patient presented to the emergency department on 10/06/15 with complaints of tingling in her right arm and leg which started previous night that  .   .On 10/07/15 patient was unable to stand due   to poor balance and weakness of her lower extremities. Patient had a stroke a few years ago. She was left with some left-sided weakness but normally walks around independently.  Patient continues to feel weak and tired, numbness and tingling of upper extrimity and neurology was following her.    SUBJECTIVE:  .   VITAL SIGNS: BP 140/82 mmHg  Pulse 106  Temp(Src) 97.5 F (36.4 C) (Axillary)  Resp 23  Ht 5\' 2"  (1.575 m)  Wt 136 lb 14.4 oz (62.097 kg)  BMI 25.03 kg/m2  SpO2 95%  HEMODYNAMICS:    VENTILATOR SETTINGS:   INTAKE / OUTPUT: I/O last 3 completed shifts: In: 3080 [I.V.:2900; NG/GT:180] Out: 2080 [Urine:2010; Emesis/NG output:70] PHYSICAL EXAMINATION: General:  Elderly white female w/ increased accessory muscle use and intermittent confusion  Neuro:  Awake, alert, confused, follows commands, marked decreased LE strength HEENT: Atraumatic, normocephalic, NGT in place. Neck veins flat. MM dry Cardiovascular: SI,S2, rrr, no MRG Lungs:increased accessory muscle use. Diffuse rhonchi. Tactile frem  Abdomen: Soft, nontender Musculoskeletal: normal muscle tone   Skin:Intact  LABS:  BMET  Recent Labs Lab 10/06/15 0612 10/06/15 0620 10/07/15 1041 10/09/15 1030  NA 139 140 138 130*  K 4.2 4.2 4.0 3.6  CL 104 101 99* 97*  CO2 25  --  25 21*  BUN 8 9 13 13   CREATININE 0.62 0.60 0.65 1.01*  GLUCOSE 109* 105* 150* 201*    Electrolytes  Recent Labs Lab 10/06/15 0612 10/07/15 1041 10/08/15 0521 10/09/15 1030  CALCIUM 9.1 9.7  --  8.4*  MG  --   --  1.9  --     CBC  Recent Labs Lab 10/09/15 1030 10/09/15 1400 10/11/15 0930  WBC 19.2* 15.8* 17.4*  HGB 10.7* 10.0* 8.4*  HCT 31.6* 29.7* 25.4*  PLT 525* 458* 455*    Coag's  Recent Labs Lab 10/06/15 0612  APTT 28  INR 1.19    Sepsis Markers  Recent Labs Lab 10/09/15 1047 10/09/15 1343  LATICACIDVEN 3.9* 1.7    ABG  Recent Labs Lab 10/09/15 1123  PHART 7.354  PCO2ART 33.0*  PO2ART 59.0*    Liver Enzymes  Recent Labs Lab 10/06/15 0612 10/07/15 1041 10/09/15 1030  AST 31 30 36  ALT 36 32 24  ALKPHOS 46 50 40  BILITOT 0.2* 0.5 0.5  ALBUMIN 3.1* 3.1* 2.5*    Cardiac Enzymes No results for input(s): TROPONINI, PROBNP in the last 168 hours.  Glucose No results for input(s): GLUCAP in the last 168 hours.  Imaging No results found.   STUDIES:   MRI head wo contrast >2/22 was indicative ofNo evidence of large vessel occlusion or significant proximal  stenosis. Moderate branch vessel irregularity suggestive of Atherosclerosis MR Brain wo contrast>2/21 indicative ofAtrophy and chronic microvascular ischemia  CThead wo contrast>2/211 was negative  CULTURES: Sputum 2/26>>>  ANTIBIOTICS: vanc 2/26>>> Zosyn 2/26>>>  SIGNIFICANT EVENTS: ED> 2/21 On 10/09/15  Patient was found to be Hypotensive,85/50  And was as low as 65/44and was transferred to the ICU for closer monitoring. Prob UGIB from plavix  2/25 looked better; plan for SDU 2/26: increased weakness, worsening resp distress. Code status reversed, Intubated. Emergent IVIG and  MRI   LINES/TUBES: Central line> 10/09/15>>> OETT 2/26>>>   ASSESSMENT / PLAN:  Neuro: A: Acute onset of markedly progressive gate ataia, followed by ascending LE weakness and paraesthesia.  CT neg for CVA. ? Atypical GBS vs transverse myelitis vs  paraneoplastic encephalomyelitis. Remarkably worse. Now w/ resp muscle weakness. Spoke w/ neuro; planning on empiric IVIG w/ working dx of GBS vs TM P:   MRI today Empiric IVIG Supportive care   PULMONARY A: Acute hypoxic respiratory syndrome d/t neuromuscular weakness and progressive atelectasis Possible HCAP P Intubate/ventilate PAD protocol  F/u cxr  Sputum culture   Cardiovascular A: borderline HTN; Sedation related hypotension P Tele IVFs Fluid bolus and neo as indicated GI:  A: Hx of metastatic colon cancer; S/P right colectomy and chemo(2015) Liver lesion Acute upper GI bleed -->hgb stable  P:   Cont NG to LIWS Cont PPI Decision for endoscopy per GI; may want to consider this after intubation   Renal A: Mild hyponatremia  P: Check abg Cont NS Avoid hypotension Strict I&O  HEME A: Anemia of critical illness w/ UGIB-->no evidence of active bleeding as of 2/24 Rising leukocytosis  P: See ID section  Trend CBC Place PAS Transfuse per protocol   Infectious disease A: R/o HCAP P: Sputum culture Start empiric vanc/zosyn 2/26>>> PCT algo   Endocrine A: Hyperglycemia/DM P: ssi   Much worse w/ progressive neuro-muscular weakness. Now requiring intubation. Changed to full code. Will plan for MRI, empiric IVIG, add HCAP coverage given rising WBC count. Extensive time organizing this w/ the neuro-hosptialist team as well as discussing this w/ family who have agreed to full code status and brief intubation to see if we can reverse the current neurological acute issues.   Cynthia [name] ACNP-BC [place] Pager # [phone] OR # [phone] if no answer     10/11/2015 10:11  AM

## 2015-10-11 NOTE — Progress Notes (Signed)
NIF -21, VC .84, .91 results given to MD.

## 2015-10-11 NOTE — Progress Notes (Signed)
[facility] Progress Note  Subjective: Patient alert and oriented but is experiencing upper and lower extremity weakness, still having visual and tactile hallucinations. About 50 mL of mildly coffee-ground material and suction canister. No stools. No hemoglobin in 2 days.  Objective: Vital signs in last 24 hours: Temp:  [96.5 F (35.8 C)-97.6 F (36.4 C)] 97.5 F (36.4 C) (02/26 0800) Pulse Rate:  [73-109] 106 (02/26 0800) Resp:  [11-26] 22 (02/26 0800) BP: (83-214)/(57-161) 115/89 mmHg (02/26 0800) SpO2:  [94 %-100 %] 98 % (02/26 0800) Weight change:    PE: Detailed exam not done Lab Results: No results found for this or any previous visit (from the past 24 hour(s)).  Studies/Results: Dg Chest Port 1 View  10/09/2015  CLINICAL DATA:  Central catheter placement EXAM: PORTABLE CHEST 1 VIEW COMPARISON:  October 09, 2015 study obtained earlier in the day FINDINGS: Central catheter tip is in the superior vena cava. Nasogastric tube tip and side port are below the diaphragm with the side port seen in the stomach. No pneumothorax. There is atelectatic change in the bases, slightly more on the left than on the right. Lungs elsewhere clear. Heart size and pulmonary vascularity are normal. No adenopathy. No bone lesions. IMPRESSION: Tube and catheter positions as described without pneumothorax. Bibasilar atelectasis. No consolidation. No change in cardiac silhouette. Electronically Signed   By: Lowella Grip III M.D.   On: 10/09/2015 12:43   Dg Chest Port 1 View  10/09/2015  CLINICAL DATA:  Hemoptysis EXAM: PORTABLE CHEST 1 VIEW COMPARISON:  10/06/2015 FINDINGS: Cardiomediastinal silhouette is stable. Elevation of the right hemidiaphragm again noted. Again noted triangular shape atelectasis in right middle lobe/ infrahilar region. There is new linear atelectasis left base medially. No segmental infiltrate or pulmonary edema. IMPRESSION: Elevation of the right hemidiaphragm again  noted. Again noted triangular shape atelectasis in right middle lobe/ infrahilar region. There is new linear atelectasis left base medially. No segmental infiltrate or pulmonary edema. Electronically Signed   By: Lahoma Crocker M.D.   On: 10/09/2015 10:24      Assessment: Coffee-ground emesis, seems to have slowed History of colon cancer with liver metastases. Weakness, Hallucinations  Plan: 1. Check hemoglobin and continue to monitor stools and NG output 2. Continue PPI 3. Will need EGD at some point but will hold off due to clinical stability and neuropsychiatric issues which are being worked up. 4. Cannot tell from chart status of her metastatic cancer and may need imaging in relation to her paresis.    Cynthia [name] C 10/11/2015, 8:49 AM  Pager [phone] If no answer or after 5 PM call [phone]

## 2015-10-11 NOTE — Progress Notes (Addendum)
Subjective: Called to reevaluate patient for recurrent hallucinations and worsening leg weakness.   Objective: Current vital signs: BP 140/82 mmHg  Pulse 106  Temp(Src) 97.5 F (36.4 C) (Axillary)  Resp 23  Ht 5\' 2"  (1.575 m)  Wt 62.097 kg (136 lb 14.4 oz)  BMI 25.03 kg/m2  SpO2 95% Vital signs in last 24 hours: Temp:  [97.5 F (36.4 C)-97.6 F (36.4 C)] 97.5 F (36.4 C) (02/26 0800) Pulse Rate:  [77-133] 106 (02/26 0900) Resp:  [11-26] 23 (02/26 0900) BP: (83-214)/(57-161) 140/82 mmHg (02/26 0900) SpO2:  [94 %-100 %] 95 % (02/26 0900)  Intake/Output from previous day: 02/25 0701 - 02/26 0700 In: 2060 [I.V.:1880; NG/GT:180] Out: 1970 [id number]; Emesis/NG output:50] Intake/Output this shift: Total I/O In: 105 [I.V.:75; NG/GT:30] Out: 300 [Urine:300] Nutritional status:    General exam: HEENT: NCAT Pulmonary: Using accessory muscles of respiration. Appears dyspneic. Skin: Mottling of skin along portions of the lower extremities, most notably the knees.   Neurologic Exam: Mental Status: Awake, oriented, thought content appropriate with good insight. Appears fatigued, limiting speech output. No dysphasia.  Cranial Nerves: II: Fixates and tracks normally. III,IV, VI: ptosis not present, extra-ocular motions intact bilaterally V: Decreased sensation to left face. VII: No facial droop VIII: hearing intact to conversation IX,X: No hypophonia or hoarseness noted. XI: bilateral shoulder shrug symmetric XII: midline tongue extension Motor: 2/5 strength bilateral upper extremities symmetrically. Worsened since prior exam.  LLE: 1/5 hip flexion, 2/5 knee extension, 2/5 ankle dorsiflexion. Worsened since prior exam.  RLE: 1/5 hip flexion, 2/5 knee extension, 2/5 ankle dorsiflexion. Worsened since prior exam.  Flaccid tone x 4, worse in lower extremities.   Sensory: Severe loss of vibration sensation and proprioception to toes and ankles. Moderately decreased vibration  sensation at knees. Decreased temperature sensation to right leg sparing the foot. Decreased temperature left forearm. Decreased fine touch sensation bilateral lower extremities from feet to hip girdle region with no clearly defined sensory level. Sensory findings unchanged since prior exam.  Deep Tendon Reflexes: 1+ bilateral brachioradialis, decreased since last exam. 0 at biceps. 0 at patellae and achilles bilaterally.  Plantars: Mute.  Cerebellar: Unable to test.  Gait: Unable to ambulate.   Lab Results: Basic Metabolic Panel:  Recent Labs Lab 10/06/15 0612 10/06/15 0620 10/07/15 1041 10/08/15 0521 10/09/15 1030  NA 139 140 138  --  130*  K 4.2 4.2 4.0  --  3.6  CL 104 101 99*  --  97*  CO2 25  --  25  --  21*  GLUCOSE 109* 105* 150*  --  201*  BUN 8 9 13   --  13  CREATININE 0.62 0.60 0.65  --  1.01*  CALCIUM 9.1  --  9.7  --  8.4*  MG  --   --   --  1.9  --     Liver Function Tests:  Recent Labs Lab 10/06/15 0612 10/07/15 1041 10/09/15 1030  AST 31 30 36  ALT 36 32 24  ALKPHOS 46 50 40  BILITOT 0.2* 0.5 0.5  PROT 6.7 7.4 5.8*  ALBUMIN 3.1* 3.1* 2.5*   No results for input(s): LIPASE, AMYLASE in the last 168 hours. No results for input(s): AMMONIA in the last 168 hours.  CBC:  Recent Labs Lab 10/06/15 0612 10/06/15 0620 10/09/15 1030 10/09/15 1400 10/11/15 0930  WBC 7.5  --  19.2* 15.8* 17.4*  NEUTROABS 5.8  --   --   --  15.3*  HGB 12.0 14.3 10.7*  10.0* 8.4*  HCT 36.7 42.0 31.6* 29.7* 25.4*  MCV 89.5  --  86.3 86.6 85.5  PLT 332  --  525* 458* 455*    Cardiac Enzymes:  Recent Labs Lab 10/07/15 1041  CKTOTAL 81    Lipid Panel:  Recent Labs Lab 10/07/15 0555  CHOL 149  TRIG 56  HDL 39*  CHOLHDL 3.8  VLDL 11  LDLCALC 99    CBG: No results for input(s): GLUCAP in the last 168 hours.  Microbiology: Results for orders placed or performed during the hospital encounter of 10/06/15  MRSA PCR Screening     Status: None    Collection Time: 10/09/15 12:58 PM  Result Value Ref Range Status   MRSA by PCR NEGATIVE NEGATIVE Final    Comment:        The GeneXpert MRSA Assay (FDA approved for NASAL specimens only), is one component of a comprehensive MRSA colonization surveillance program. It is not intended to diagnose MRSA infection nor to guide or monitor treatment for MRSA infections.     Coagulation Studies: No results for input(s): LABPROT, INR in the last 72 hours.  Imaging: Dg Chest Port 1 View  10/09/2015  CLINICAL DATA:  Central catheter placement EXAM: PORTABLE CHEST 1 VIEW COMPARISON:  October 09, 2015 study obtained earlier in the day FINDINGS: Central catheter tip is in the superior vena cava. Nasogastric tube tip and side port are below the diaphragm with the side port seen in the stomach. No pneumothorax. There is atelectatic change in the bases, slightly more on the left than on the right. Lungs elsewhere clear. Heart size and pulmonary vascularity are normal. No adenopathy. No bone lesions. IMPRESSION: Tube and catheter positions as described without pneumothorax. Bibasilar atelectasis. No consolidation. No change in cardiac silhouette. Electronically Signed   By: Lowella Grip III M.D.   On: 10/09/2015 12:43   Dg Chest Port 1 View  10/09/2015  CLINICAL DATA:  Hemoptysis EXAM: PORTABLE CHEST 1 VIEW COMPARISON:  10/06/2015 FINDINGS: Cardiomediastinal silhouette is stable. Elevation of the right hemidiaphragm again noted. Again noted triangular shape atelectasis in right middle lobe/ infrahilar region. There is new linear atelectasis left base medially. No segmental infiltrate or pulmonary edema. IMPRESSION: Elevation of the right hemidiaphragm again noted. Again noted triangular shape atelectasis in right middle lobe/ infrahilar region. There is new linear atelectasis left base medially. No segmental infiltrate or pulmonary edema. Electronically Signed   By: Lahoma Crocker M.D.   On: 10/09/2015 10:24     Medications:  Current facility-administered medications:  .   stroke: mapping our early stages of recovery book, , Does not apply, Once, Willia Craze, NP .  0.9 %  sodium chloride infusion, , Intravenous, Continuous, Raylene Miyamoto, MD .  0.9 %  sodium chloride infusion, , Intravenous, Continuous, Raylene Miyamoto, MD, Stopped at 10/10/15 1500 .  0.9 %  sodium chloride infusion, , Intravenous, Continuous, [name], NP, Last Rate: 75 mL/hr at 10/11/15 0600 .  0.9 %  sodium chloride infusion, 750 mL, Intravenous, PRN, Erick Colace, NP, Stopped at 10/09/15 1500 .  acetaminophen (TYLENOL) tablet 1,000 mg, 1,000 mg, Oral, TID PRN, Waldemar Dickens, MD, 1,000 mg at 10/06/15 1659 .  antiseptic oral rinse (CPC / CETYLPYRIDINIUM CHLORIDE 0.05%) solution 7 mL, 7 mL, Mouth Rinse, BID, Raylene Miyamoto, MD, 7 mL at 10/10/15 2333 .  fentaNYL (SUBLIMAZE) 100 MCG/2ML injection, , , ,  .  gabapentin (NEURONTIN) capsule 100 mg, 100  mg, Oral, TID, Oswald Hillock, MD, 100 mg at 10/10/15 2232 .  hydrALAZINE (APRESOLINE) injection 10 mg, 10 mg, Intravenous, Q2H PRN, Colbert Coyer, MD, 10 mg at 10/11/15 0420 .  labetalol (NORMODYNE,TRANDATE) injection 10 mg, 10 mg, Intravenous, Q2H PRN, Colbert Coyer, MD, 10 mg at 10/10/15 0707 .  metoprolol succinate (TOPROL-XL) 24 hr tablet 37.5 mg, 37.5 mg, Oral, QPM, Willia Craze, NP, 37.5 mg at 10/08/15 1810 .  midazolam (VERSED) 2 MG/2ML injection, , , ,  .  morphine 2 MG/ML injection 1 mg, 1 mg, Intravenous, Q3H PRN, Gardiner Barefoot, NP, 1 mg at 10/08/15 2150 .  ondansetron (ZOFRAN) injection 4 mg, 4 mg, Intravenous, Q6H PRN, Gardiner Barefoot, NP, 4 mg at 10/08/15 0425 .  pantoprazole (PROTONIX) 80 mg in sodium chloride 0.9 % 100 mL IVPB, 80 mg, Intravenous, Once, Oswald Hillock, MD, 80 mg at 10/09/15 1130 .  pantoprazole (PROTONIX) 80 mg in sodium chloride 0.9 % 250 mL (0.32 mg/mL) infusion, 8 mg/hr, Intravenous, Continuous,  Oswald Hillock, MD, 8 mg/hr at 10/09/15 1130 .  [START ON 10/12/2015] pantoprazole (PROTONIX) injection 40 mg, 40 mg, Intravenous, Q12H, Oswald Hillock, MD .  senna-docusate (Senokot-S) tablet 1 tablet, 1 tablet, Oral, QHS PRN, Willia Craze, NP  Assessment: 1. Progressive worsening of lower extremity weakness, now with new onset of worsened upper extremity motor strength. Hyporeflexia of upper extremities also has worsened. Lower extremities remain areflexic. Tone in all 4 extremities has decreased since yesterday. Now using accessory muscles of respiration and appears dyspneic. FVC and NIF ordered at time of this evaluation are 0.84 liter and -21 mm Hg, respectively. Most likely component of the DDx given rate of worsening and ascending pattern of her weakness is GBS. Still on the DDx but less likely would be paraneoplastic encephalomyelitis. Overall clinical picture would be atypical for myasthenia gravis and LEMS, given lack of bulbar weakness for the former and rapid fatigueability for the latter. Her upper and lower extremity findings today are now more consistent with an acute neuropathy than a transverse myelitis. Her hallucinations are more likely attributable to mild hospital delirium. A thoracic and lumbar spine MRI will still be needed to rule out a transverse myelitis. Benefits/risks favor empiric treatment of probable GBS prior to obtaining LP and MRI, given rapid deterioration. Discussed with patient as well as her daughter over the phone, who have both expressed understanding and agreement with the plan.  2. Will likely need intubation. Has a DNI order. Discussed with patient as well as her daughter over the phone. We have obtained verbal consent to reverse the DNI order and intubate if necessary.  3. Son, who is her healthcare power of attorney, was unreachable by telephone despite two attempts.  Recommendations: 1. Discussed with MICU attending. The patient will need to be intubated due to  respiratory compromise.  2. Obtain MRI of thoracic and lumbar spine with and without contrast after she has been intubated and stabilized.  3. Start IVIG now. Plan is for 0.4 g/kg qd x 5 days.  4. Frequent neuro checks.  5. Will remain on the Intensivist service.  6. Avoid benzodiazepines and other muscle relaxants as well as meds that can worsen neuromuscular junction transmission.  7. Avoid medications that can decrease respiratory drive.  8. Avoid morphine as patient states she has hallucinations with this medication.   45 minutes spent in the bedside assessment of this critically ill patient. Time included discussion with family  and coordination of care.   Kerney Elbe, MD 10/11/2015, 10:09 AM

## 2015-10-11 NOTE — Progress Notes (Signed)
Paged neuro dr Cheral Marker and hospitalist dr Sherral Hammers to make aware of change in status. Pt unable move bilateral lower extremities with decreased sensation, unable to move bilateral arms to gravity. Pt uable to keep trunk upright in bed. Increased work of breathing at times with rate in upper 20s. Increased O2 from 1L to 3L [place] due to sats dropping to upper 80. Pt has decreased breath sounds throughout.

## 2015-10-11 NOTE — Progress Notes (Signed)
Notify NP pete babcock about decrease heart rate to 50s and decrease BP 80s/50s while on precedex and having to turn on neo. Order to DC precedex

## 2015-10-11 NOTE — Procedures (Signed)
Intubation Procedure Note Cynthia [name] [id number] 1936/07/14  Procedure: Intubation Indications: Respiratory insufficiency  Procedure Details Consent: Risks of procedure as well as the alternatives and risks of each were explained to the (patient/caregiver).  Consent for procedure obtained. Time Out: Verified patient identification, verified procedure, site/side was marked, verified correct patient position, special equipment/implants available, medications/allergies/relevent history reviewed, required imaging and test results available.  Performed  Cynthia [name] and 3   Evaluation Hemodynamic Status: BP stable throughout; O2 sats: transiently fell during during procedure Patient's Current Condition: stable Complications: No apparent complications Patient did tolerate procedure well. Chest X-ray ordered to verify placement.  CXR: pending.   Cynthia [name] 10/11/2015

## 2015-10-11 NOTE — Progress Notes (Signed)
Pharmacy Antibiotic Note  Cynthia [name] is a 80 y.o. female admitted on 10/06/2015 with pneumonia.  Increased weakness, respiratory distress, intubated on 2/26. Pharmacy has been consulted for vanc/zosyn dosing. Hx metastatic colon cancer. Afeb, wbc 17.4 (up). SCr fluctuating 0.65>1.01>0.53, CrCl~51, good UOP.  Plan: Zosyn 3.375g IV (57min inf) x 1; then 3.375g IV q8h (4h inf) Vanc 1250mg  x1; then 500mg  IV q12h Monitor clinical progress, c/s, renal function, abx plan/LOT VT@SS  as indicated  Height: 5\' 2"  (157.5 cm) Weight: 136 lb 14.4 oz (62.097 kg) IBW/kg (Calculated) : 50.1  Temp (24hrs), Avg:97.6 F (36.4 C), Min:97.5 F (36.4 C), Max:97.6 F (36.4 C)   Recent Labs Lab 10/06/15 0612 10/06/15 0620 10/07/15 1041 10/09/15 1030 10/09/15 1047 10/09/15 1343 10/09/15 1400 10/11/15 0930  WBC 7.5  --   --  19.2*  --   --  15.8* 17.4*  CREATININE 0.62 0.60 0.65 1.01*  --   --   --  0.53  LATICACIDVEN  --   --   --   --  3.9* 1.7  --  1.5    Estimated Creatinine Clearance: 51 mL/min (by C-G formula based on Cr of 0.53).    No Known Allergies  Antimicrobials this admission: 2/26 vanc >>  2/26 zosyn >>   Dose adjustments this admission: n/a  Microbiology results: 2/26 BCx:  2/26 Sputum:   2/26 UC: 2/26 MRSA PCR: neg  Elicia Lamp, PharmD, BCPS Clinical Pharmacist Pager [phone] 10/11/2015 10:45 AM

## 2015-10-12 ENCOUNTER — Inpatient Hospital Stay (HOSPITAL_COMMUNITY): Payer: Medicare Other

## 2015-10-12 ENCOUNTER — Encounter (HOSPITAL_COMMUNITY): Payer: Self-pay | Admitting: Pulmonary Disease

## 2015-10-12 DIAGNOSIS — R209 Unspecified disturbances of skin sensation: Secondary | ICD-10-CM

## 2015-10-12 DIAGNOSIS — J9601 Acute respiratory failure with hypoxia: Secondary | ICD-10-CM

## 2015-10-12 LAB — BASIC METABOLIC PANEL
ANION GAP: 12 (ref 5–15)
Anion gap: 8 (ref 5–15)
BUN: 14 mg/dL (ref 6–20)
BUN: 15 mg/dL (ref 6–20)
CALCIUM: 7.9 mg/dL — AB (ref 8.9–10.3)
CALCIUM: 8 mg/dL — AB (ref 8.9–10.3)
CO2: 25 mmol/L (ref 22–32)
CO2: 25 mmol/L (ref 22–32)
CREATININE: 0.52 mg/dL (ref 0.44–1.00)
Chloride: 100 mmol/L — ABNORMAL LOW (ref 101–111)
Chloride: 101 mmol/L (ref 101–111)
Creatinine, Ser: 0.63 mg/dL (ref 0.44–1.00)
GFR calc Af Amer: >60 mL/min (ref >60)
Glucose, Bld: 116 mg/dL — ABNORMAL HIGH (ref 65–99)
Glucose, Bld: 132 mg/dL — ABNORMAL HIGH (ref 65–99)
Potassium: 2.7 mmol/L — CL (ref 3.5–5.1)
Potassium: 3.3 mmol/L — ABNORMAL LOW (ref 3.5–5.1)
SODIUM: 133 mmol/L — AB (ref 135–145)
Sodium: 138 mmol/L (ref 135–145)

## 2015-10-12 LAB — PROCALCITONIN: PROCALCITONIN: 0.61 ng/mL

## 2015-10-12 LAB — URINE CULTURE: CULTURE: NO GROWTH

## 2015-10-12 LAB — CBC
HCT: 22.7 % — ABNORMAL LOW (ref 36.0–46.0)
Hemoglobin: 7.5 g/dL — ABNORMAL LOW (ref 12.0–15.0)
MCH: 28.4 pg (ref 26.0–34.0)
MCHC: 33 g/dL (ref 30.0–36.0)
MCV: 86 fL (ref 78.0–100.0)
PLATELETS: 388 10*3/uL (ref 150–400)
RBC: 2.64 MIL/uL — ABNORMAL LOW (ref 3.87–5.11)
RDW: 14.3 % (ref 11.5–15.5)
WBC: 13.7 10*3/uL — AB (ref 4.0–10.5)

## 2015-10-12 LAB — APTT: aPTT: 27 seconds (ref 24–37)

## 2015-10-12 MED ORDER — POTASSIUM CHLORIDE 20 MEQ/15ML (10%) PO SOLN
40.0000 meq | ORAL | Status: AC
  Administered 2015-10-12 (×2): 40 meq
  Filled 2015-10-12 (×3): qty 30

## 2015-10-12 NOTE — Progress Notes (Addendum)
10/12/2015 8:36 AM Pt. With loose, dark bm. Dr. Watt Climes on floor and made aware. No new orders at this time. Will continue to closely monitor patient.  Bushra Denman, Arville Lime

## 2015-10-12 NOTE — Progress Notes (Signed)
[facility] ADULT ICU REPLACEMENT PROTOCOL FOR AM LAB REPLACEMENT ONLY  The patient does apply for the [facility] Adult ICU Electrolyte Replacment Protocol based on the criteria listed below:   1. Is GFR >/= 40 ml/min? Yes.    Patient's GFR today is >60 2. Is urine output >/= 0.5 ml/kg/hr for the last 6 hours? Yes.   Patient's UOP is 1.2 ml/kg/hr 3. Is BUN < 60 mg/dL? Yes.    Patient's BUN today is 14 4. Abnormal electrolyte(s): K2.7 5. Ordered repletion with: per protocol 6. If a panic level lab has been reported, has the CCM MD in charge been notified? Yes.  .   Physician:  A De-Dios, MD  Vear Clock 10/12/2015 5:13 AM

## 2015-10-12 NOTE — Progress Notes (Addendum)
10/12/2015 5:30 PM Neurologist at bedside with verbal order to decrease IVIG infusion to lowest possible rate and do not increase due to increase in BP. Orders enacted. Will continue to closely monitor patient.  Salena Ortlieb, Arville Lime

## 2015-10-12 NOTE — Progress Notes (Signed)
Initial Nutrition Assessment  DOCUMENTATION CODES:   Not applicable  INTERVENTION:    Start TF as medically appropriate.  Recommend Vital AF 1.2 formula at 10 ml/hr and increase by 10 ml every 8 hours to goal rate of 30 ml/hr.   Prostat liquid protein 30 ml TID.  Total TF regimen to provide 1164 kcals, 99 gm protein, 584 ml of free water  NUTRITION DIAGNOSIS:   Inadequate oral intake related to inability to eat as evidenced by NPO status  GOAL:   Patient will meet greater than or equal to 90% of their needs  MONITOR:   Vent status, Labs, Weight trends, Skin, I & O's  REASON FOR ASSESSMENT:   Ventilator  ASSESSMENT:   80 yo Female with metastatic colon cancer, and with neurologic issues of numbness tingling increased weakness. Stay complicated by GI bleed. Transfer to the ICU for hypotension.  Patient is currently intubated on ventilator support -- NGT to LIS MV: 6.4 L/min Temp (24hrs), Avg:97.7 F (36.5 C), Min:97.2 F (36.2 C), Max:98.2 F (36.8 C)   Patient discussed in ICU rounds.    MRI showed [place] Liter.  To start IVIG today.  Pt also with subacute upper GI bleeding.  + coffee ground/ dark stool emesis.  Nutrition focused physical exam completed.  No muscle or subcutaneous fat depletion noticed.  Diet Order:  Diet NPO time specified  Skin:  Reviewed, no issues  Last BM:  2/27  Height:   Ht Readings from Last 1 Encounters:  10/06/15 5\' 2"  (1.575 m)    Weight:   Wt Readings from Last 1 Encounters:  10/06/15 136 lb 14.4 oz (62.097 kg)    Ideal Body Weight:  50 kg  BMI:  Body mass index is 25.03 kg/(m^2).  Estimated Nutritional Needs:   Kcal:  1154  Protein:  95-105 gm  Fluid:  per MD  EDUCATION NEEDS:   No education needs identified at this time  Arthur Holms, RD, LDN Pager #: [phone] After-Hours Pager #: [phone]

## 2015-10-12 NOTE — Progress Notes (Signed)
Cynthia [name] 9:26 AM  Subjective: Patient seen and examined and case discussed with her nurse and my partner Dr. Amedeo Plenty and her hospital computer chart reviewed and she denies any abdominal pain and the results of her MRI was noted  Objective: Vital signs stable afebrile no acute distress abdomen is soft nontender she is intubated and coffee grounds coming out of her NG tube with dark bowel movement as per nurse hemoglobin slight decrease  Assessment: Cynthia [name] in patient with multiple medical problems including subacute upper GI bleeding  Plan: Will ask critical care team to call my partner Dr. Oletta Lamas who is available all day to discuss the timing of endoscopy which we are happy to proceed with when okay with you  [facility]  Pager [phone] After 5PM or if no answer call [phone]

## 2015-10-12 NOTE — Progress Notes (Signed)
PT Cancellation Note  Patient Details Name: Cynthia [name] MRN: [id number] DOB: 01/27/1936   Cancelled Treatment:    Reason Eval/Treat Not Completed: Patient not medically ready Pt emergently intubated last night and found to have GBS. Not appropriate for PT at this time. Will follow up.   Marguarite Arbour A Berenice Oehlert 10/12/2015, 9:41 AM Wray Kearns, PT, DPT [phone]

## 2015-10-12 NOTE — Progress Notes (Signed)
Interval History:                                                                                                                      Cynthia [name] is an 80 y.o. female patient with severe upper and lower extremity weakness, intubated. Suspected Guillain-Barr syndrome based on clinical evaluation with acute weakness, and areflexia. She is started on IVIG by Dr. Cheral Marker yesterday.   Nursing staff reports the patient was able to move her upper extremities better, and able to shrug her shoulders better than yesterday. Tolerating lower rates of IVIG infusion at 30 mL an hour, with some hypertension noted with higher rates beyond 70 mL an hour.   Patient's daughter at bedside.    Past Medical History: Past Medical History  Diagnosis Date  . Anemia   . Blood transfusion without reported diagnosis jan 2015  . Heart murmur   . Hypertension     no bp meds   . Liver cyst   . C. difficile colitis 03/05/14, 03/22/14, 04/05/14    recurrent  . Stroke ([place]) 12/2014    NUMBNESS ON LEFT SIDE  . Cervical cancer ([place]) 1972  . Colon cancer ([place]) JAN 2015  . Colon cancer ([place])   . Cancer ([place])     liver  . Complication of anesthesia     slow to awaken in past, DONE WELL RECENTLY  . Tinnitus of both ears ALL THE TIME  . DVT (deep venous thrombosis) ([place]) 10/24/13    Right leg    Past Surgical History  Procedure Laterality Date  . Back surgery  1981    lower lumbar  . Tubal ligation  1968  . Laparoscopic right colectomy Right 10/22/2013    Procedure: LAPAROSCOPIC RIGHT COLECTOMY ;  Surgeon: [name], MD;  Location: [facility];  Service: General;  Laterality: Right;  . Salpingoophorectomy  10/22/2013    Procedure: [place] Saa;  Surgeon: [name]. [name], MD;  Location: [facility];  Service: Gynecology;;  . Abdominal hysterectomy      vaginal- partial    Family History: Family History  Problem Relation Age of Onset  . Diabetes Sister   . Cancer Sister     leukemia  .  Diabetes Brother   . Heart failure Brother   . Hypertension Brother   . Breast cancer Maternal Aunt   . Rheum arthritis Mother   . Heart failure Father   . Cancer Maternal Grandmother     GIST  . Heart failure Maternal Grandfather   . Aneurysm Paternal Grandmother     Social History:   reports that she has never smoked. She has never used smokeless tobacco. She reports that she does not drink alcohol or use illicit drugs.  Allergies:  No Known Allergies   Medications:  Current facility-administered medications:  .  0.9 %  sodium chloride infusion, , Intravenous, Continuous, Chesley Mires, MD, Last Rate: 50 mL/hr at 10/12/15 1900 .  antiseptic oral rinse solution (CORINZ), 7 mL, Mouth Rinse, QID, Erick Colace, NP, 7 mL at 10/12/15 1620 .  chlorhexidine gluconate (PERIDEX) 0.12 % solution 15 mL, 15 mL, Mouth Rinse, BID, Erick Colace, NP, 15 mL at 10/12/15 2047 .  fentaNYL (SUBLIMAZE) injection 50 mcg, 50 mcg, Intravenous, Q2H PRN, Erick Colace, NP, 50 mcg at 10/12/15 2048 .  Immune Globulin 10% (OCTAGAM) IV infusion 25 g, 400 mg/kg, Intravenous, Q24 Hr x 5, Kerney Elbe, MD, Last Rate: 37.2 mL/hr at 10/12/15 1900, 25 g at 10/12/15 1900 .  pantoprazole (PROTONIX) injection 40 mg, 40 mg, Intravenous, Q12H, Oswald Hillock, MD, 40 mg at 10/12/15 2133 .  piperacillin-tazobactam (ZOSYN) IVPB 3.375 g, 3.375 g, Intravenous, Q8H, Romona Curls, RPH, 3.375 g at 10/12/15 1907 .  vancomycin (VANCOCIN) 500 mg in sodium chloride 0.9 % 100 mL IVPB, 500 mg, Intravenous, Q12H, Romona Curls, RPH, 500 mg at 10/12/15 1130   Neurologic Examination:                                                                                                      Blood pressure 108/58, pulse 83, temperature 98.7 F (37.1 C), temperature source Oral, resp. rate 16, height 5\' 2"  (1.575 m), weight  62.097 kg (136 lb 14.4 oz), SpO2 100 %.  Patient is intubated, able to open eyes to verbal command. She follows commands consistently not in acute distress. She is able to squeeze hands and slightly move her upper extremity is bilaterally. No movement in lower extremities.  she is areflexic. Reports grossly intact sensation throughout.     Lab Results: Basic Metabolic Panel:  Recent Labs Lab 10/07/15 1041 10/08/15 0521 10/09/15 1030 10/11/15 0930 10/12/15 0347 10/12/15 2039  NA 138  --  130* 134* 133* 138  K 4.0  --  3.6 3.4* 2.7* 3.3*  CL 99*  --  97* 99* 100* 101  CO2 25  --  21* 22 25 25   GLUCOSE 150*  --  201* 136* 116* 132*  BUN 13  --  13 15 14 15   CREATININE 0.65  --  1.01* 0.53 0.52 0.63  CALCIUM 9.7  --  8.4* 8.6* 7.9* 8.0*  MG  --  1.9  --  1.8  --   --     Liver Function Tests:  Recent Labs Lab 10/06/15 0612 10/07/15 1041 10/09/15 1030 10/11/15 0930  AST 31 30 36 37  ALT 36 32 24 24  ALKPHOS 46 50 40 53  BILITOT 0.2* 0.5 0.5 0.8  PROT 6.7 7.4 5.8* 5.8*  ALBUMIN 3.1* 3.1* 2.5* 2.5*   No results for input(s): LIPASE, AMYLASE in the last 168 hours. No results for input(s): AMMONIA in the last 168 hours.  CBC:  Recent Labs Lab 10/06/15 0612 10/06/15 0620 10/09/15 1030 10/09/15 1400 10/11/15 0930 10/12/15 0347  WBC 7.5  --  19.2* 15.8* 17.4* 13.7*  NEUTROABS 5.8  --   --   --  15.3*  --   HGB 12.0 14.3 10.7* 10.0* 8.4* 7.5*  HCT 36.7 42.0 31.6* 29.7* 25.4* 22.7*  MCV 89.5  --  86.3 86.6 85.5 86.0  PLT 332  --  525* 458* 455* 388    Cardiac Enzymes:  Recent Labs Lab 10/07/15 1041 10/11/15 0930 10/11/15 1738 10/11/15 2214  CKTOTAL 81  --   --   --   TROPONINI  --  0.17* 0.22* 0.20*    Lipid Panel:  Recent Labs Lab 10/07/15 0555  CHOL 149  TRIG 56  HDL 39*  CHOLHDL 3.8  VLDL 11  LDLCALC 99    CBG: No results for input(s): GLUCAP in the last 168 hours.  Microbiology: Results for orders placed or performed during the  hospital encounter of 10/06/15  MRSA PCR Screening     Status: None   Collection Time: 10/09/15 12:58 PM  Result Value Ref Range Status   MRSA by PCR NEGATIVE NEGATIVE Final    Comment:        The GeneXpert MRSA Assay (FDA approved for NASAL specimens only), is one component of a comprehensive MRSA colonization surveillance program. It is not intended to diagnose MRSA infection nor to guide or monitor treatment for MRSA infections.   Urine culture     Status: None   Collection Time: 10/11/15  9:36 AM  Result Value Ref Range Status   Specimen Description URINE, RANDOM  Final   Special Requests NONE  Final   Culture NO GROWTH 1 DAY  Final   Report Status 10/12/2015 FINAL  Final  Culture, respiratory (NON-Expectorated)     Status: None (Preliminary result)   Collection Time: 10/11/15 10:45 AM  Result Value Ref Range Status   Specimen Description TRACHEAL ASPIRATE  Final   Special Requests NONE  Final   Gram Stain   Final    RARE WBC NO SQUAMOUS EPITHELIAL CELLS SEEN FEW GRAM POSITIVE COCCI IN CLUSTERS Performed at [facility]    Culture   Final    NORMAL OROPHARYNGEAL FLORA Performed at [facility]    Report Status PENDING  Incomplete  Culture, blood (routine x 2)     Status: None (Preliminary result)   Collection Time: 10/11/15 11:45 AM  Result Value Ref Range Status   Specimen Description BLOOD LEFT ANTECUBITAL  Final   Special Requests BOTTLES DRAWN AEROBIC AND ANAEROBIC  5CC  Final   Culture NO GROWTH 1 DAY  Final   Report Status PENDING  Incomplete  Culture, blood (routine x 2)     Status: None (Preliminary result)   Collection Time: 10/11/15 11:52 AM  Result Value Ref Range Status   Specimen Description BLOOD LEFT ARM  Final   Special Requests IN PEDIATRIC BOTTLE  2CC  Final   Culture NO GROWTH 1 DAY  Final   Report Status PENDING  Incomplete    Imaging: Mr Thoracic Spine W Wo Contrast  10/11/2015  CLINICAL DATA:  Colon cancer. Progressive  lower extremity weakness. New onset of upper extremity motor weakness. Height but reflexia of the upper extremities has progressed. The lower extremities are without reflexes. Decreased tone. Question Guillain-Barre syndrome. EXAM: MRI THORACIC AND LUMBAR SPINE WITHOUT AND WITH CONTRAST TECHNIQUE: Multiplanar and multiecho pulse sequences of the thoracic and lumbar spine were obtained without and with intravenous contrast. CONTRAST:  54mL MULTIHANCE GADOBENATE DIMEGLUMINE 529 MG/ML IV SOLN COMPARISON:  MRI of the brain 10/06/2015. MRI  of the cervical spine 10/08/2015. FINDINGS: MR THORACIC SPINE FINDINGS Normal signal is present throughout the thoracic spinal cord. The conus medullaris terminates at L1, within normal limits. Heterogeneous marrow signal is present. There is no focal enhancement to suggest metastatic disease. Posterior hemangiomas are present at T6 and T7. Vertebral body heights are maintained throughout the thoracic spine. Bilateral pleural effusions are present, right greater than left. There is associated volume loss and atelectasis. Mild leftward curvature is present in the upper thoracic spine, centered at T5. No significant focal disc protrusion is present within the thoracic spine. Asymmetric left-sided facet hypertrophy is present at T9-10 results in mild left foraminal stenosis. The foramina are otherwise patent bilaterally. There is some enhancement about the distal spinal cord and particularly within the nerve roots. MR LUMBAR SPINE FINDINGS Normal signal is present in the conus medullaris which terminates at L2. A prominent hemangioma is noted posteriorly at the L1 level. There is chronic fatty endplate marrow change at L5-S1. Limited imaging of the abdomen demonstrates atherosclerotic changes in the aorta without aneurysm. Prominent hepatic cysts are again noted. L1-2: Mild facet hypertrophy is noted bilaterally without significant stenosis. L2-3: Mild facet hypertrophy is present  bilaterally. There is mild disc bulging. There is no significant stenosis. L3-4: A broad-based disc protrusion is present. Mild facet hypertrophy is evident bilaterally. A left laminectomy is noted. There is no residual or recurrent stenosis. L4-5: A left laminectomy is noted. Mild facet hypertrophy is present bilaterally. A broad-based disc protrusion is worse on the right. This results in mild subarticular and foraminal narrowing bilaterally. L5-S1: A left laminectomy is noted. Mild facet hypertrophy is worse on the left. Mild foraminal narrowing is also worse on the left. Both anterior and posterior nerve roots demonstrate enhancement without significant enlargement. IMPRESSION: 1. No evidence for metastatic disease to the thoracic or lumbar spine. 2. Peripheral enhancement of the distal thoracic spinal cord is well is the nerve roots with anterior and posterior suggesting Guillain-Barre. No discrete mass lesion is present. Other inflammatory or autoimmune process ease or metastatic disease are considered less likely given the uniformity and lack of significant nerve root enlargement. 3. Left laminectomies at L3-4, L4-5, and L5-S1. 4. Mild foraminal narrowing bilaterally at L4-5 and L5-S1. 5. Mild subarticular narrowing bilaterally at L4-5 despite laminectomy. 6. Mild left foraminal narrowing at T9-10 due to facet disease. Electronically Signed   By: San Morelle M.D.   On: 10/11/2015 17:35   Mr Lumbar Spine W Wo Contrast  10/11/2015  : Please see lumbar spine report by Dr. Jobe Igo, under accession [id number] Electronically Signed   By: Van Clines M.D.   On: 10/11/2015 17:52   Portable Chest Xray  10/12/2015  CLINICAL DATA:  Intubation . EXAM: PORTABLE CHEST 1 VIEW COMPARISON:  10/11/2015. FINDINGS: Endotracheal tube, right IJ central line, NG tube in stable position. Cardiomegaly with normal pulmonary vascularity. Low lung volumes with basilar atelectasis and/or infiltrates. Small  bilateral pleural effusions. IMPRESSION: 1. Lines and tubes in stable position. 2. Low lung volumes with bibasilar atelectasis and/or infiltrates. Small bilateral pleural effusions. Electronically Signed   By: Marcello Moores  Register   On: 10/12/2015 07:37   Portable Chest Xray  10/11/2015  CLINICAL DATA:  Acute respiratory failure -endotracheal tube placement. EXAM: PORTABLE CHEST 1 VIEW COMPARISON:  10/09/2015 and prior exams FINDINGS: Endotracheal tube with tip 4.5 cm above carina is now noted. NG tube with tip overlying the mid stomach and right IJ central venous catheter sheath again noted. Bilateral lower  lung opacities are noted. Trace bilateral pleural effusions are present. There is no evidence of pneumothorax. The cardiomediastinal silhouette is unchanged. IMPRESSION: Endotracheal tube with tip 4.5 cm above carina. Little significant change in bilateral lower lung atelectasis/ airspace disease with trace bilateral pleural effusions. Electronically Signed   By: Margarette Canada M.D.   On: 10/11/2015 11:16   Dg Chest Port 1 View  10/09/2015  CLINICAL DATA:  Central catheter placement EXAM: PORTABLE CHEST 1 VIEW COMPARISON:  October 09, 2015 study obtained earlier in the day FINDINGS: Central catheter tip is in the superior vena cava. Nasogastric tube tip and side port are below the diaphragm with the side port seen in the stomach. No pneumothorax. There is atelectatic change in the bases, slightly more on the left than on the right. Lungs elsewhere clear. Heart size and pulmonary vascularity are normal. No adenopathy. No bone lesions. IMPRESSION: Tube and catheter positions as described without pneumothorax. Bibasilar atelectasis. No consolidation. No change in cardiac silhouette. Electronically Signed   By: Lowella Grip III M.D.   On: 10/09/2015 12:43   Dg Chest Port 1 View  10/09/2015  CLINICAL DATA:  Hemoptysis EXAM: PORTABLE CHEST 1 VIEW COMPARISON:  10/06/2015 FINDINGS: Cardiomediastinal silhouette  is stable. Elevation of the right hemidiaphragm again noted. Again noted triangular shape atelectasis in right middle lobe/ infrahilar region. There is new linear atelectasis left base medially. No segmental infiltrate or pulmonary edema. IMPRESSION: Elevation of the right hemidiaphragm again noted. Again noted triangular shape atelectasis in right middle lobe/ infrahilar region. There is new linear atelectasis left base medially. No segmental infiltrate or pulmonary edema. Electronically Signed   By: Lahoma Crocker M.D.   On: 10/09/2015 10:24    Assessment and plan:   Cynthia [name] is an 80 y.o. female patient  with quadriparesis, areflexia, suspected Guillain-Barr syndrome based on clinical evaluation and started on IVIG. She is doing better with some mild improvement in the upper extremities as described above. Tolerating lower rates of IVIG infusion better. Hence recommended keep the rate of infusion at 30 mL an hour without increasing it further.  Total of 5 days of  IVIG infusion planned with a total dose of 2 g/kg over 5 days.  We'll follow-up.

## 2015-10-12 NOTE — Progress Notes (Signed)
CRITICAL VALUE ALERT  Critical value received:  K 2.7  Date of notification:  10/12/2015  Time of notification:  0500  Critical value read back:Yes.    Nurse who received alert:  Glenford Peers RN  MD notified (1st page):  Warren Lacy MD  Time of first page:  0504  MD notified (2nd page):  Time of second page:  Responding MD:  De-Dios  Time MD responded:  [phone]

## 2015-10-12 NOTE — Progress Notes (Signed)
PULMONARY / CRITICAL CARE MEDICINE   Name: Cynthia [name] MRN: [id number] DOB: September 10, 1935    ADMISSION DATE:  10/06/2015 CONSULTATION DATE: 10/09/15  REFERRING MD: Triad  CHIEF COMPLAINT: Paresthesia  SUBJECTIVE:  Remains on vent.  VITAL SIGNS: BP 127/69 mmHg  Pulse 78  Temp(Src) 98.2 F (36.8 C) (Axillary)  Resp 19  Ht 5\' 2"  (1.575 m)  Wt 136 lb 14.4 oz (62.097 kg)  BMI 25.03 kg/m2  SpO2 99%  VENTILATOR SETTINGS: Vent Mode:  [-] CPAP;PSV FiO2 (%):  [40 %-100 %] 40 % Set Rate:  [14 bmp] 14 bmp Vt Set:  [400 mL] 400 mL PEEP:  [5 cmH20] 5 cmH20 Pressure Support:  [10 cmH20] 10 cmH20 Plateau Pressure:  [14 cmH20-15 cmH20] 14 cmH20   INTAKE / OUTPUT: I/O last 3 completed shifts: In: 4426.5 [I.V.:2854; NG/GT:210; IV Piggyback:1362.5] Out: 2300 [Urine:2175; Emesis/NG output:125]   PHYSICAL EXAMINATION: General: ill appearing Neuro: RASS -3 HEENT: ETT in place Cardiac: regular, no murmur Chest: scattered crackles Abd: soft, non tender Ext: no edema Skin: no rashes  LABS:  BMET  Recent Labs Lab 10/09/15 1030 10/11/15 0930 10/12/15 0347  NA 130* 134* 133*  K 3.6 3.4* 2.7*  CL 97* 99* 100*  CO2 21* 22 25  BUN 13 15 14   CREATININE 1.01* 0.53 0.52  GLUCOSE 201* 136* 116*    Electrolytes  Recent Labs Lab 10/08/15 0521 10/09/15 1030 10/11/15 0930 10/12/15 0347  CALCIUM  --  8.4* 8.6* 7.9*  MG 1.9  --  1.8  --     CBC  Recent Labs Lab 10/09/15 1400 10/11/15 0930 10/12/15 0347  WBC 15.8* 17.4* 13.7*  HGB 10.0* 8.4* 7.5*  HCT 29.7* 25.4* 22.7*  PLT 458* 455* 388    Coag's  Recent Labs Lab 10/06/15 0612  APTT 28  INR 1.19    Sepsis Markers  Recent Labs Lab 10/09/15 1047 10/09/15 1343 10/11/15 0930 10/12/15 0347  LATICACIDVEN 3.9* 1.7 1.5  --   PROCALCITON  --   --  0.96 0.61    ABG  Recent Labs Lab 10/09/15 1123 10/11/15 1141  PHART 7.354 7.437  PCO2ART 33.0* 39.1  PO2ART 59.0* 393.0*    Liver  Enzymes  Recent Labs Lab 10/07/15 1041 10/09/15 1030 10/11/15 0930  AST 30 36 37  ALT 32 24 24  ALKPHOS 50 40 53  BILITOT 0.5 0.5 0.8  ALBUMIN 3.1* 2.5* 2.5*    Cardiac Enzymes  Recent Labs Lab 10/11/15 0930 10/11/15 1738 10/11/15 2214  TROPONINI 0.17* 0.22* 0.20*    Glucose No results for input(s): GLUCAP in the last 168 hours.  Imaging Mr Thoracic Spine W Wo Contrast  10/11/2015  CLINICAL DATA:  Colon cancer. Progressive lower extremity weakness. New onset of upper extremity motor weakness. Height but reflexia of the upper extremities has progressed. The lower extremities are without reflexes. Decreased tone. Question Guillain-Barre syndrome. EXAM: MRI THORACIC AND LUMBAR SPINE WITHOUT AND WITH CONTRAST TECHNIQUE: Multiplanar and multiecho pulse sequences of the thoracic and lumbar spine were obtained without and with intravenous contrast. CONTRAST:  50mL MULTIHANCE GADOBENATE DIMEGLUMINE 529 MG/ML IV SOLN COMPARISON:  MRI of the brain 10/06/2015. MRI of the cervical spine 10/08/2015. FINDINGS: MR THORACIC SPINE FINDINGS Normal signal is present throughout the thoracic spinal cord. The conus medullaris terminates at L1, within normal limits. Heterogeneous marrow signal is present. There is no focal enhancement to suggest metastatic disease. Posterior hemangiomas are present at T6 and T7. Vertebral body heights are maintained throughout the  thoracic spine. Bilateral pleural effusions are present, right greater than left. There is associated volume loss and atelectasis. Mild leftward curvature is present in the upper thoracic spine, centered at T5. No significant focal disc protrusion is present within the thoracic spine. Asymmetric left-sided facet hypertrophy is present at T9-10 results in mild left foraminal stenosis. The foramina are otherwise patent bilaterally. There is some enhancement about the distal spinal cord and particularly within the nerve roots. MR LUMBAR SPINE FINDINGS  Normal signal is present in the conus medullaris which terminates at L2. A prominent hemangioma is noted posteriorly at the L1 level. There is chronic fatty endplate marrow change at L5-S1. Limited imaging of the abdomen demonstrates atherosclerotic changes in the aorta without aneurysm. Prominent hepatic cysts are again noted. L1-2: Mild facet hypertrophy is noted bilaterally without significant stenosis. L2-3: Mild facet hypertrophy is present bilaterally. There is mild disc bulging. There is no significant stenosis. L3-4: A broad-based disc protrusion is present. Mild facet hypertrophy is evident bilaterally. A left laminectomy is noted. There is no residual or recurrent stenosis. L4-5: A left laminectomy is noted. Mild facet hypertrophy is present bilaterally. A broad-based disc protrusion is worse on the right. This results in mild subarticular and foraminal narrowing bilaterally. L5-S1: A left laminectomy is noted. Mild facet hypertrophy is worse on the left. Mild foraminal narrowing is also worse on the left. Both anterior and posterior nerve roots demonstrate enhancement without significant enlargement. IMPRESSION: 1. No evidence for metastatic disease to the thoracic or lumbar spine. 2. Peripheral enhancement of the distal thoracic spinal cord is well is the nerve roots with anterior and posterior suggesting Guillain-Barre. No discrete mass lesion is present. Other inflammatory or autoimmune process ease or metastatic disease are considered less likely given the uniformity and lack of significant nerve root enlargement. 3. Left laminectomies at L3-4, L4-5, and L5-S1. 4. Mild foraminal narrowing bilaterally at L4-5 and L5-S1. 5. Mild subarticular narrowing bilaterally at L4-5 despite laminectomy. 6. Mild left foraminal narrowing at T9-10 due to facet disease. Electronically Signed   By: San Morelle M.D.   On: 10/11/2015 17:35   Mr Lumbar Spine W Wo Contrast  10/11/2015  : Please see lumbar spine  report by Dr. Jobe Igo, under accession [id number] Electronically Signed   By: Van Clines M.D.   On: 10/11/2015 17:52   Portable Chest Xray  10/12/2015  CLINICAL DATA:  Intubation . EXAM: PORTABLE CHEST 1 VIEW COMPARISON:  10/11/2015. FINDINGS: Endotracheal tube, right IJ central line, NG tube in stable position. Cardiomegaly with normal pulmonary vascularity. Low lung volumes with basilar atelectasis and/or infiltrates. Small bilateral pleural effusions. IMPRESSION: 1. Lines and tubes in stable position. 2. Low lung volumes with bibasilar atelectasis and/or infiltrates. Small bilateral pleural effusions. Electronically Signed   By: Marcello Moores  Register   On: 10/12/2015 07:37   Portable Chest Xray  10/11/2015  CLINICAL DATA:  Acute respiratory failure -endotracheal tube placement. EXAM: PORTABLE CHEST 1 VIEW COMPARISON:  10/09/2015 and prior exams FINDINGS: Endotracheal tube with tip 4.5 cm above carina is now noted. NG tube with tip overlying the mid stomach and right IJ central venous catheter sheath again noted. Bilateral lower lung opacities are noted. Trace bilateral pleural effusions are present. There is no evidence of pneumothorax. The cardiomediastinal silhouette is unchanged. IMPRESSION: Endotracheal tube with tip 4.5 cm above carina. Little significant change in bilateral lower lung atelectasis/ airspace disease with trace bilateral pleural effusions. Electronically Signed   By: Margarette Canada M.D.   On:  10/11/2015 11:16     STUDIES:  2/21 CT head >> no acute findings 2/21 MRI brain >> chronic microvascular ischemic changes 2/23 Echo >> EF 60 to 65%, mild AR, mild MR 2/23 MRI c spine >> stenosis 2/26 MRI t/l spine >> Peripheral enhancement of the distal thoracic spinal cord is well is the nerve roots with anterior and posterior suggesting Guillain-Barre  CULTURES: 2/26 Urine >> 2/26 Sputum >> 2/26 Blood >>  ANTIBIOTICS: 2/26 Vancomycin >> 2/26 Zosyn >>  SIGNIFICANT EVENTS: 2/21  Admit 2/22 neurology consulted 2/24 Coffee ground emesis, hypotension >> to ICU; GI consulted 2/26 Respiratory worse >> changed code status >> intubated; start IVIG 2/27 Off pressors  LINES/TUBES: 2/24 Rt IJ CVL >>  2/26 ETT >>   DISCUSSION: 80 yo female presented with paresthesia in upper/lower extremities.  This was associated with gait ataxia.  She developed coffee ground emesis and hypotension >> transferred to ICU.  She has hx of CVA in May 2016, metastatic colon cancer s/p Rt colectomy and xeloda (last in November 2015), and recurrent C diff.  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 P:   F/u LP results Day 2/5 of IVIG RASS goal -1  PULMONARY A: Acute hypoxic respiratory failure. P Full vent support F/u CXR  CARDIAC A: Hypotension 2nd to GI bleed 2/24 >> resolved. P Monitor hemodynamics  GASTROENTEROLOGY A: Acute upper GI bleed 2/214. Hx of metastatic colon cancer >> s/p abalation of liver lesions by IR 10/06/15. P:   F/u with GI to determine timing of EGD Continue protonix bid Add tube feeds when okay with GI  RENAL A: Hypokalemia. P: Replace electrolytes as needed  HEMATOLOGY A: Anemia 2nd to GI bleed, critical illness, chronic disease. P: F/u CBC Transfuse for Hb < 7 SCD's for DVT prevention  INFECTIOUS DISEASE. A: HCAP. P: Day 2 of vancomycin, zosyn  ENDOCRINE A: Hyperglycemia. P: Monitor blood sugar on BMET  Goals of Care: Was initially DNI >> code status reversed 2/26.  Initial plan to re-assess after 5 to 7 days on vent.  Explained to family that recovery from GBS is typically prolonged process that requires long term vent support, and that they should take this into consideration as we discuss goals of care.  Updated pt's family at bedside.  CC time 47 minutes.  Chesley Mires, MD [facility] Pulmonary/Critical Care 10/12/2015, 11:24  AM Pager:  [phone] After 3pm call: [phone]

## 2015-10-13 ENCOUNTER — Inpatient Hospital Stay (HOSPITAL_COMMUNITY): Payer: Medicare Other

## 2015-10-13 LAB — HEMOGLOBIN AND HEMATOCRIT, BLOOD
HCT: 29.4 % — ABNORMAL LOW (ref 36.0–46.0)
Hemoglobin: 9.4 g/dL — ABNORMAL LOW (ref 12.0–15.0)

## 2015-10-13 LAB — BASIC METABOLIC PANEL
Anion gap: 11 (ref 5–15)
Anion gap: 8 (ref 5–15)
BUN: 14 mg/dL (ref 6–20)
BUN: 14 mg/dL (ref 6–20)
CALCIUM: 7.6 mg/dL — AB (ref 8.9–10.3)
CO2: 26 mmol/L (ref 22–32)
CO2: 26 mmol/L (ref 22–32)
CREATININE: 0.54 mg/dL (ref 0.44–1.00)
Calcium: 8.1 mg/dL — ABNORMAL LOW (ref 8.9–10.3)
Chloride: 104 mmol/L (ref 101–111)
Chloride: 105 mmol/L (ref 101–111)
Creatinine, Ser: 0.66 mg/dL (ref 0.44–1.00)
GFR calc Af Amer: >60 mL/min (ref >60)
GFR calc Af Amer: >60 mL/min (ref >60)
GLUCOSE: 111 mg/dL — AB (ref 65–99)
GLUCOSE: 118 mg/dL — AB (ref 65–99)
POTASSIUM: 3.9 mmol/L (ref 3.5–5.1)
Potassium: 2.9 mmol/L — ABNORMAL LOW (ref 3.5–5.1)
SODIUM: 141 mmol/L (ref 135–145)
SODIUM: 139 mmol/L (ref 135–145)

## 2015-10-13 LAB — CBC
HCT: 19.7 % — ABNORMAL LOW (ref 36.0–46.0)
HEMOGLOBIN: 6.7 g/dL — AB (ref 12.0–15.0)
MCH: 29.8 pg (ref 26.0–34.0)
MCHC: 34 g/dL (ref 30.0–36.0)
MCV: 87.6 fL (ref 78.0–100.0)
PLATELETS: 304 10*3/uL (ref 150–400)
RBC: 2.25 MIL/uL — ABNORMAL LOW (ref 3.87–5.11)
RDW: 15 % (ref 11.5–15.5)
WBC: 12.4 10*3/uL — ABNORMAL HIGH (ref 4.0–10.5)

## 2015-10-13 LAB — MAGNESIUM: MAGNESIUM: 1.8 mg/dL (ref 1.7–2.4)

## 2015-10-13 LAB — PROCALCITONIN: PROCALCITONIN: 0.33 ng/mL

## 2015-10-13 LAB — PREPARE RBC (CROSSMATCH)

## 2015-10-13 MED ORDER — GABAPENTIN 250 MG/5ML PO SOLN
250.0000 mg | Freq: Three times a day (TID) | ORAL | Status: DC
  Administered 2015-10-13 – 2015-10-14 (×4): 250 mg via ORAL
  Filled 2015-10-13 (×7): qty 6

## 2015-10-13 MED ORDER — POTASSIUM CHLORIDE 20 MEQ/15ML (10%) PO SOLN
40.0000 meq | ORAL | Status: AC
  Administered 2015-10-13 (×2): 40 meq
  Filled 2015-10-13 (×3): qty 30

## 2015-10-13 MED ORDER — HYDRALAZINE HCL 20 MG/ML IJ SOLN
10.0000 mg | INTRAMUSCULAR | Status: DC | PRN
  Administered 2015-10-13 – 2015-10-14 (×3): 10 mg via INTRAVENOUS
  Filled 2015-10-13 (×3): qty 1

## 2015-10-13 MED ORDER — SODIUM CHLORIDE 0.9 % IV SOLN: Freq: Once | INTRAVENOUS | Status: AC

## 2015-10-13 MED ORDER — MAGNESIUM SULFATE 2 GM/50ML IV SOLN
2.0000 g | Freq: Once | INTRAVENOUS | Status: AC
  Administered 2015-10-13: 2 g via INTRAVENOUS
  Filled 2015-10-13: qty 50

## 2015-10-13 MED ORDER — LIDOCAINE 5 % EX PTCH
2.0000 | MEDICATED_PATCH | CUTANEOUS | Status: DC
  Administered 2015-10-13 – 2015-10-26 (×8): 2 via TRANSDERMAL
  Filled 2015-10-13 (×16): qty 2

## 2015-10-13 MED ORDER — GABAPENTIN 250 MG/5ML PO SOLN
250.0000 mg | Freq: Three times a day (TID) | ORAL | Status: DC
  Filled 2015-10-13: qty 6

## 2015-10-13 NOTE — Progress Notes (Signed)
[facility] [facility] Progress Note Patient Name: JADELIN WIMS DOB: 01/28/1936 MRN: [id number]   Date of Service  10/13/2015  HPI/Events of Note  RN called for Hb 6.56from 7.6 from 8 from 10. GI following. With coffee ground emesis  eICU Interventions  Transfuse 1 u PRBC     Intervention Category Major Interventions: Hemorrhage - evaluation and management  [place] 10/13/2015, 7:04 AM

## 2015-10-13 NOTE — Progress Notes (Signed)
[facility] ICU Electrolyte Replacement Protocol  Patient Name: Cynthia [name] DOB: Mar 30, 1936 MRN: [id number]  Date of Service  10/13/2015   HPI/Events of Note    Recent Labs Lab 10/08/15 0521 10/09/15 1030 10/11/15 0930 10/12/15 0347 10/12/15 2039 10/13/15 0415  NA  --  130* 134* 133* 138 141  K  --  3.6 3.4* 2.7* 3.3* 2.9*  CL  --  97* 99* 100* 101 104  CO2  --  21* 22 25 25 26   GLUCOSE  --  201* 136* 116* 132* 111*  BUN  --  13 15 14 15 14   CREATININE  --  1.01* 0.53 0.52 0.63 0.66  CALCIUM  --  8.4* 8.6* 7.9* 8.0* 7.6*  MG 1.9  --  1.8  --   --  1.8    Estimated Creatinine Clearance: 51 mL/min (by C-G formula based on Cr of 0.66).  Intake/Output      02/27 0701 - 02/28 0700   I.V. (mL/kg) 964.7 (15.5)   NG/GT 30   IV Piggyback 200   Total Intake(mL/kg) 1194.7 (19.2)   Urine (mL/kg/hr) 785 (0.5)   Emesis/NG output 350 (0.2)   Total Output 1135   Net +59.7        - I/O DETAILED x24h    Total I/O In: 110 [I.V.:60; IV Piggyback:50] Out: 230 [Urine:230] - I/O THIS SHIFT    ASSESSMENT   eICURN Interventions  K+ replaced per protocol.   ASSESSMENT: MAJOR ELECTROLYTE    Lorene Dy 10/13/2015, 6:04 AM

## 2015-10-13 NOTE — Progress Notes (Signed)
10/13/2015 1450 Notified pharmacy reference, ready to administer IVIG. Will notify IV team upon arrival to floor.  [name], Arville Lime

## 2015-10-13 NOTE — Progress Notes (Signed)
Pt. able to pull (-)35 on NIF/FVC .8 L, RT to monitor.

## 2015-10-13 NOTE — Progress Notes (Signed)
10/13/2015 1615 Notified IV therapy IVIG on floor and ready to infuse. Will await IV team to initiate.  Nissa Stannard, Arville Lime

## 2015-10-13 NOTE — Progress Notes (Signed)
Cynthia [name] 7:16 AM  Subjective: Patient continues to have coffee ground from her NG without any further bowel movements and no abdominal pain and she seems to answer questions appropriately and her case was discussed with her nurse as well  Objective: Vital signs stable afebrile no acute distress abdomen is soft nontender hemoglobin decreased  Assessment: Multiple medical problems including subacute upper GI bleeding  Plan: Please let myself or Dr. Oletta Lamas know when it is okay to proceed with endoscopy from critical care team standpoint  [facility]  Pager [phone] After 5PM or if no answer call [phone]

## 2015-10-13 NOTE — Progress Notes (Signed)
10/13/2015 1130 Nursing note Pt. C/o significant increase in pain/tingling sensation in RUE. Pt. States prn fentanyl is not helping. Neurology paged and made aware. MD to see pt. And place orders. Pt. And family updated on plan of care. Will continue to closely monitor patient. Kasheem Toner, Arville Lime

## 2015-10-13 NOTE — Progress Notes (Signed)
Tentative bedside egd tomorrow at [facility]

## 2015-10-13 NOTE — Progress Notes (Signed)
Informed by primary RN that the IVIG is to infuse at the lowest rate for the entire infusion due to it causing her BP to rise.  So it will not be titrated per protocol.   Left at 37.13ml/hr via pump.

## 2015-10-13 NOTE — Progress Notes (Signed)
PULMONARY / CRITICAL CARE MEDICINE   Name: Cynthia [name] MRN: [id number] DOB: 1936/05/18    ADMISSION DATE:  10/06/2015 CONSULTATION DATE: 10/09/15  REFERRING MD: Triad  CHIEF COMPLAINT: Paresthesia  SUBJECTIVE:  Denies chest pain or abdominal pain.  Improved movement in upper extremities.  VITAL SIGNS: BP 169/79 mmHg  Pulse 66  Temp(Src) 98.1 F (36.7 C) (Axillary)  Resp 16  Ht 5\' 2"  (1.575 m)  Wt 136 lb 14.4 oz (62.097 kg)  BMI 25.03 kg/m2  SpO2 100%  VENTILATOR SETTINGS: Vent Mode:  [-] PRVC FiO2 (%):  [40 %] 40 % Set Rate:  [14 bmp] 14 bmp Vt Set:  [400 mL] 400 mL PEEP:  [5 cmH20] 5 cmH20 Plateau Pressure:  [14 cmH20-21 cmH20] 14 cmH20   INTAKE / OUTPUT: I/O last 3 completed shifts: In: 2474.2 [I.V.:1979.2; NG/GT:120; IV Piggyback:375] Out: 2365 [Urine:1490; Emesis/NG output:875]   PHYSICAL EXAMINATION: General: ill appearing Neuro: RASS 0, improved movement in upper extremities HEENT: ETT in place Cardiac: regular, no murmur Chest: crackles Rt > Lt Abd: soft, non tender Ext: no edema Skin: no rashes  LABS:  BMET  Recent Labs Lab 10/12/15 0347 10/12/15 2039 10/13/15 0415  NA 133* 138 141  K 2.7* 3.3* 2.9*  CL 100* 101 104  CO2 25 25 26   BUN 14 15 14   CREATININE 0.52 0.63 0.66  GLUCOSE 116* 132* 111*    Electrolytes  Recent Labs Lab 10/08/15 0521  10/11/15 0930 10/12/15 0347 10/12/15 2039 10/13/15 0415  CALCIUM  --   < > 8.6* 7.9* 8.0* 7.6*  MG 1.9  --  1.8  --   --  1.8  < > = values in this interval not displayed.  CBC  Recent Labs Lab 10/11/15 0930 10/12/15 0347 10/13/15 0415  WBC 17.4* 13.7* 12.4*  HGB 8.4* 7.5* 6.7*  HCT 25.4* 22.7* 19.7*  PLT 455* 388 304    Coag's  Recent Labs Lab 10/12/15 1200  APTT 27    Sepsis Markers  Recent Labs Lab 10/09/15 1047 10/09/15 1343 10/11/15 0930 10/12/15 0347 10/13/15 0415  LATICACIDVEN 3.9* 1.7 1.5  --   --   PROCALCITON  --   --  0.96 0.61 0.33     ABG  Recent Labs Lab 10/09/15 1123 10/11/15 1141  PHART 7.354 7.437  PCO2ART 33.0* 39.1  PO2ART 59.0* 393.0*    Liver Enzymes  Recent Labs Lab 10/07/15 1041 10/09/15 1030 10/11/15 0930  AST 30 36 37  ALT 32 24 24  ALKPHOS 50 40 53  BILITOT 0.5 0.5 0.8  ALBUMIN 3.1* 2.5* 2.5*    Cardiac Enzymes  Recent Labs Lab 10/11/15 0930 10/11/15 1738 10/11/15 2214  TROPONINI 0.17* 0.22* 0.20*    Glucose No results for input(s): GLUCAP in the last 168 hours.  Imaging Dg Chest Port 1 View  10/13/2015  CLINICAL DATA:  Respiratory failure EXAM: PORTABLE CHEST 1 VIEW COMPARISON:  10/12/2015 FINDINGS: Endotracheal tube and nasogastric catheter are again seen and stable. A right jugular central the sheath is noted and stable. Small bilateral pleural effusions are again identified. Slight increased progression in the degree of left lower lobe consolidation is noted IMPRESSION: Small bilateral pleural effusions. Tubes and lines as described above. Progressive consolidation in the left lower lobe. Electronically Signed   By: Inez Catalina M.D.   On: 10/13/2015 07:43     STUDIES:  2/21 CT head >> no acute findings 2/21 MRI brain >> chronic microvascular ischemic changes 2/23 Echo >> EF  60 to 65%, mild AR, mild MR 2/23 MRI c spine >> stenosis 2/26 MRI t/l spine >> Peripheral enhancement of the distal thoracic spinal cord is well is the nerve roots with anterior and posterior suggesting Guillain-Barre  CULTURES: 2/26 Urine >> negative 2/26 Sputum >> 2/26 Blood >>  ANTIBIOTICS: 2/26 Vancomycin >> 2/26 Zosyn >>  SIGNIFICANT EVENTS: 2/21 Admit 2/22 neurology consulted 2/24 Coffee ground emesis, hypotension >> to ICU; GI consulted 2/26 Respiratory worse >> changed code status >> intubated; start IVIG 2/27 Off pressors 2/28 Transfuse 1 unit PRBC  LINES/TUBES: 2/24 Rt IJ CVL >>  2/26 ETT >>   DISCUSSION: 80 yo female presented with paresthesia in upper/lower extremities.   This was associated with gait ataxia.  She developed coffee ground emesis and hypotension >> transferred to ICU.  She has hx of CVA in May 2016, metastatic colon cancer s/p Rt colectomy and xeloda (last in November 2015) and s/p ablation of liver lesion by IR 10/06/15, and recurrent C diff.  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 P:   Day 3/5 of IVIG RASS goal 0 to -1  PULMONARY A: Acute hypoxic respiratory failure. P Pressure support wean as tolerated F/u CXR  CARDIAC A: Hypotension 2nd to GI bleed 2/24 >> resolved. Hypertension. P Monitor hemodynamics Prn hydralazine for SBP > 160  GASTROENTEROLOGY A: Acute upper GI bleed 2/24. P:   Should be okay to arrange for EGD on 3/01 or 3/02 >> defer to GI Continue protonix bid Add tube feeds when okay with GI  RENAL A: Hypokalemia. Hypomagnesemia. P: Replace electrolytes as needed  HEMATOLOGY A: Anemia 2nd to GI bleed, critical illness, chronic disease. P: F/u CBC Transfuse for Hb < 7 SCD's for DVT prevention  INFECTIOUS DISEASE. A: HCAP. P: Day 3 of vancomycin, zosyn  ENDOCRINE A: Hyperglycemia. P: Monitor blood sugar on BMET  Goals of Care: Was initially DNI >> code status reversed 2/26.  Initial plan to re-assess after 5 to 7 days on vent.   CC time 33 minutes.  Chesley Mires, MD [facility] Pulmonary/Critical Care 10/13/2015, 9:19 AM Pager:  [phone] After 3pm call: [phone]

## 2015-10-13 NOTE — Progress Notes (Signed)
Interval History:                                                                                                                      Cynthia [name] is an 80 y.o. female patient with Guillain-Barr syndrome, currently on IVIG infusion, Therese day 3. She has noticed significant improvement in the motor motor strength, especially in her upper extremities. She is able to elevate bilateral upper extremities antigravity and briefly although couldn't sustain. She is able to flex her elbow easily bilaterally. Lower studies she has slightly increased movement but not significant, does not have any antigravity strength in lower extremities, severe weakness in her feet. She is alert and follows commands, continues to remain intubated.  She's been experiencing some distress due to painful paresthesias in bilateral upper extremities worse on the right arm. She is able to communicate with nodding yes and no consistently.   Past Medical History: Past Medical History  Diagnosis Date  . Anemia   . Blood transfusion without reported diagnosis jan 2015  . Heart murmur   . Hypertension     no bp meds   . Liver cyst   . C. difficile colitis 03/05/14, 03/22/14, 04/05/14    recurrent  . Stroke ([place]) 12/2014    NUMBNESS ON LEFT SIDE  . Cervical cancer ([place]) 1972  . Colon cancer ([place]) JAN 2015  . Colon cancer ([place])   . Cancer ([place])     liver  . Complication of anesthesia     slow to awaken in past, DONE WELL RECENTLY  . Tinnitus of both ears ALL THE TIME  . DVT (deep venous thrombosis) ([place]) 10/24/13    Right leg    Past Surgical History  Procedure Laterality Date  . Back surgery  1981    lower lumbar  . Tubal ligation  1968  . Laparoscopic right colectomy Right 10/22/2013    Procedure: LAPAROSCOPIC RIGHT COLECTOMY ;  Surgeon: [name], MD;  Location: [facility];  Service: General;  Laterality: Right;  . Salpingoophorectomy  10/22/2013    Procedure: [place] Saa;  Surgeon: [name]. [name], MD;  Location: [facility];  Service: Gynecology;;  . Abdominal hysterectomy      vaginal- partial    Family History: Family History  Problem Relation Age of Onset  . Diabetes Sister   . Cancer Sister     leukemia  . Diabetes Brother   . Heart failure Brother   . Hypertension Brother   . Breast cancer Maternal Aunt   . Rheum arthritis Mother   . Heart failure Father   . Cancer Maternal Grandmother     GIST  . Heart failure Maternal Grandfather   . Aneurysm Paternal Grandmother     Social History:   reports that she has never smoked. She has never used smokeless tobacco. She reports that she does not drink alcohol or use illicit drugs.  Allergies:  No Known Allergies   Medications:  Current facility-administered medications:  .  0.9 %  sodium chloride infusion, , Intravenous, Continuous, Chesley Mires, MD, Last Rate: 50 mL/hr at 10/13/15 1300 .  antiseptic oral rinse solution (CORINZ), 7 mL, Mouth Rinse, QID, Erick Colace, NP, 7 mL at 10/13/15 1200 .  chlorhexidine gluconate (PERIDEX) 0.12 % solution 15 mL, 15 mL, Mouth Rinse, BID, Erick Colace, NP, 15 mL at 10/13/15 0804 .  fentaNYL (SUBLIMAZE) injection 50 mcg, 50 mcg, Intravenous, Q2H PRN, Erick Colace, NP, 50 mcg at 10/13/15 1340 .  gabapentin (NEURONTIN) 250 MG/5ML solution 250 mg, 250 mg, Oral, 3 times per day, Pema Thomure Fuller Mandril, MD .  hydrALAZINE (APRESOLINE) injection 10 mg, 10 mg, Intravenous, Q4H PRN, Chesley Mires, MD, 10 mg at 10/13/15 1122 .  Immune Globulin 10% (OCTAGAM) IV infusion 25 g, 400 mg/kg, Intravenous, Q24 Hr x 5, Kerney Elbe, MD, Last Rate: 37.2 mL/hr at 10/12/15 1900, 25 g at 10/12/15 1900 .  lidocaine (LIDODERM) 5 % 2 patch, 2 patch, Transdermal, Q24H, Donye Campanelli Fuller Mandril, MD .  pantoprazole (PROTONIX) injection 40 mg, 40 mg, Intravenous, Q12H, Oswald Hillock, MD, 40 mg at 10/13/15 0907 .  piperacillin-tazobactam (ZOSYN) IVPB 3.375 g, 3.375 g, Intravenous, Q8H, Haley Henderson Newcomer, RPH, 3.375 g at 10/13/15 1049 .  vancomycin (VANCOCIN) 500 mg in sodium chloride 0.9 % 100 mL IVPB, 500 mg, Intravenous, Q12H, Romona Curls, RPH, 500 mg at 10/13/15 1048   Neurologic Examination:                                                                                                      Blood pressure 156/75, pulse 85, temperature 97.3 F (36.3 C), temperature source Axillary, resp. rate 16, height 5\' 2"  (1.575 m), weight 62.097 kg (136 lb 14.4 oz), SpO2 100 %.  Patient remains intubated, not on any sedation. She is alert and follows commands easily. Able to flex bilateral elbows easily and briefly able to show some antigravity strength for couple of seconds in bilateral upper extremities but could not sustain beyond that. At the lower extremity she is able to move her legs sideways without antigravity strength no motor movement in her ankles. Nods to feeling symmetric sensation throughout. She is in distress from painful paresthesias in her bilateral upper extremities. Continues to be areflexic in bilateral upper and lower extremities. No abnormal involuntary movements noted  Lab Results: Basic Metabolic Panel:  Recent Labs Lab 10/08/15 0521 10/09/15 1030 10/11/15 0930 10/12/15 0347 10/12/15 2039 10/13/15 0415  NA  --  130* 134* 133* 138 141  K  --  3.6 3.4* 2.7* 3.3* 2.9*  CL  --  97* 99* 100* 101 104  CO2  --  21* 22 25 25 26   GLUCOSE  --  201* 136* 116* 132* 111*  BUN  --  13 15 14 15 14   CREATININE  --  1.01* 0.53 0.52 0.63 0.66  CALCIUM  --  8.4* 8.6* 7.9* 8.0* 7.6*  MG 1.9  --  1.8  --   --  1.8  Liver Function Tests:  Recent Labs Lab 10/07/15 1041 10/09/15 1030 10/11/15 0930  AST 30 36 37  ALT 32 24 24  ALKPHOS 50 40 53  BILITOT 0.5 0.5 0.8  PROT 7.4 5.8* 5.8*  ALBUMIN 3.1* 2.5* 2.5*   No results for input(s): LIPASE, AMYLASE  in the last 168 hours. No results for input(s): AMMONIA in the last 168 hours.  CBC:  Recent Labs Lab 10/09/15 1030 10/09/15 1400 10/11/15 0930 10/12/15 0347 10/13/15 0415  WBC 19.2* 15.8* 17.4* 13.7* 12.4*  NEUTROABS  --   --  15.3*  --   --   HGB 10.7* 10.0* 8.4* 7.5* 6.7*  HCT 31.6* 29.7* 25.4* 22.7* 19.7*  MCV 86.3 86.6 85.5 86.0 87.6  PLT 525* 458* 455* 388 304    Cardiac Enzymes:  Recent Labs Lab 10/07/15 1041 10/11/15 0930 10/11/15 1738 10/11/15 2214  CKTOTAL 81  --   --   --   TROPONINI  --  0.17* 0.22* 0.20*    Lipid Panel:  Recent Labs Lab 10/07/15 0555  CHOL 149  TRIG 56  HDL 39*  CHOLHDL 3.8  VLDL 11  LDLCALC 99    CBG: No results for input(s): GLUCAP in the last 168 hours.  Microbiology: Results for orders placed or performed during the hospital encounter of 10/06/15  MRSA PCR Screening     Status: None   Collection Time: 10/09/15 12:58 PM  Result Value Ref Range Status   MRSA by PCR NEGATIVE NEGATIVE Final    Comment:        The GeneXpert MRSA Assay (FDA approved for NASAL specimens only), is one component of a comprehensive MRSA colonization surveillance program. It is not intended to diagnose MRSA infection nor to guide or monitor treatment for MRSA infections.   Urine culture     Status: None   Collection Time: 10/11/15  9:36 AM  Result Value Ref Range Status   Specimen Description URINE, RANDOM  Final   Special Requests NONE  Final   Culture NO GROWTH 1 DAY  Final   Report Status 10/12/2015 FINAL  Final  Culture, respiratory (NON-Expectorated)     Status: None (Preliminary result)   Collection Time: 10/11/15 10:45 AM  Result Value Ref Range Status   Specimen Description TRACHEAL ASPIRATE  Final   Special Requests NONE  Final   Gram Stain   Final    RARE WBC NO SQUAMOUS EPITHELIAL CELLS SEEN FEW GRAM POSITIVE COCCI IN CLUSTERS Performed at [facility]    Culture   Final    Culture reincubated for better  growth Performed at [facility]    Report Status PENDING  Incomplete  Culture, blood (routine x 2)     Status: None (Preliminary result)   Collection Time: 10/11/15 11:45 AM  Result Value Ref Range Status   Specimen Description BLOOD LEFT ANTECUBITAL  Final   Special Requests BOTTLES DRAWN AEROBIC AND ANAEROBIC  5CC  Final   Culture NO GROWTH 2 DAYS  Final   Report Status PENDING  Incomplete  Culture, blood (routine x 2)     Status: None (Preliminary result)   Collection Time: 10/11/15 11:52 AM  Result Value Ref Range Status   Specimen Description BLOOD LEFT ARM  Final   Special Requests IN PEDIATRIC BOTTLE  2CC  Final   Culture NO GROWTH 2 DAYS  Final   Report Status PENDING  Incomplete    Imaging: Mr Thoracic Spine W Wo Contrast  10/11/2015  CLINICAL DATA:  Colon cancer. Progressive lower extremity weakness. New onset of upper extremity motor weakness. Height but reflexia of the upper extremities has progressed. The lower extremities are without reflexes. Decreased tone. Question Guillain-Barre syndrome. EXAM: MRI THORACIC AND LUMBAR SPINE WITHOUT AND WITH CONTRAST TECHNIQUE: Multiplanar and multiecho pulse sequences of the thoracic and lumbar spine were obtained without and with intravenous contrast. CONTRAST:  25mL MULTIHANCE GADOBENATE DIMEGLUMINE 529 MG/ML IV SOLN COMPARISON:  MRI of the brain 10/06/2015. MRI of the cervical spine 10/08/2015. FINDINGS: MR THORACIC SPINE FINDINGS Normal signal is present throughout the thoracic spinal cord. The conus medullaris terminates at L1, within normal limits. Heterogeneous marrow signal is present. There is no focal enhancement to suggest metastatic disease. Posterior hemangiomas are present at T6 and T7. Vertebral body heights are maintained throughout the thoracic spine. Bilateral pleural effusions are present, right greater than left. There is associated volume loss and atelectasis. Mild leftward curvature is present in the upper  thoracic spine, centered at T5. No significant focal disc protrusion is present within the thoracic spine. Asymmetric left-sided facet hypertrophy is present at T9-10 results in mild left foraminal stenosis. The foramina are otherwise patent bilaterally. There is some enhancement about the distal spinal cord and particularly within the nerve roots. MR LUMBAR SPINE FINDINGS Normal signal is present in the conus medullaris which terminates at L2. A prominent hemangioma is noted posteriorly at the L1 level. There is chronic fatty endplate marrow change at L5-S1. Limited imaging of the abdomen demonstrates atherosclerotic changes in the aorta without aneurysm. Prominent hepatic cysts are again noted. L1-2: Mild facet hypertrophy is noted bilaterally without significant stenosis. L2-3: Mild facet hypertrophy is present bilaterally. There is mild disc bulging. There is no significant stenosis. L3-4: A broad-based disc protrusion is present. Mild facet hypertrophy is evident bilaterally. A left laminectomy is noted. There is no residual or recurrent stenosis. L4-5: A left laminectomy is noted. Mild facet hypertrophy is present bilaterally. A broad-based disc protrusion is worse on the right. This results in mild subarticular and foraminal narrowing bilaterally. L5-S1: A left laminectomy is noted. Mild facet hypertrophy is worse on the left. Mild foraminal narrowing is also worse on the left. Both anterior and posterior nerve roots demonstrate enhancement without significant enlargement. IMPRESSION: 1. No evidence for metastatic disease to the thoracic or lumbar spine. 2. Peripheral enhancement of the distal thoracic spinal cord is well is the nerve roots with anterior and posterior suggesting Guillain-Barre. No discrete mass lesion is present. Other inflammatory or autoimmune process ease or metastatic disease are considered less likely given the uniformity and lack of significant nerve root enlargement. 3. Left  laminectomies at L3-4, L4-5, and L5-S1. 4. Mild foraminal narrowing bilaterally at L4-5 and L5-S1. 5. Mild subarticular narrowing bilaterally at L4-5 despite laminectomy. 6. Mild left foraminal narrowing at T9-10 due to facet disease. Electronically Signed   By: San Morelle M.D.   On: 10/11/2015 17:35   Mr Lumbar Spine W Wo Contrast  10/11/2015  : Please see lumbar spine report by Dr. Jobe Igo, under accession [id number] Electronically Signed   By: Van Clines M.D.   On: 10/11/2015 17:52   Dg Chest Port 1 View  10/13/2015  CLINICAL DATA:  Respiratory failure EXAM: PORTABLE CHEST 1 VIEW COMPARISON:  10/12/2015 FINDINGS: Endotracheal tube and nasogastric catheter are again seen and stable. A right jugular central the sheath is noted and stable. Small bilateral pleural effusions are again identified. Slight increased progression in the degree of left lower  lobe consolidation is noted IMPRESSION: Small bilateral pleural effusions. Tubes and lines as described above. Progressive consolidation in the left lower lobe. Electronically Signed   By: Inez Catalina M.D.   On: 10/13/2015 07:43   Portable Chest Xray  10/12/2015  CLINICAL DATA:  Intubation . EXAM: PORTABLE CHEST 1 VIEW COMPARISON:  10/11/2015. FINDINGS: Endotracheal tube, right IJ central line, NG tube in stable position. Cardiomegaly with normal pulmonary vascularity. Low lung volumes with basilar atelectasis and/or infiltrates. Small bilateral pleural effusions. IMPRESSION: 1. Lines and tubes in stable position. 2. Low lung volumes with bibasilar atelectasis and/or infiltrates. Small bilateral pleural effusions. Electronically Signed   By: Marcello Moores  Register   On: 10/12/2015 07:37   Portable Chest Xray  10/11/2015  CLINICAL DATA:  Acute respiratory failure -endotracheal tube placement. EXAM: PORTABLE CHEST 1 VIEW COMPARISON:  10/09/2015 and prior exams FINDINGS: Endotracheal tube with tip 4.5 cm above carina is now noted. NG tube with tip  overlying the mid stomach and right IJ central venous catheter sheath again noted. Bilateral lower lung opacities are noted. Trace bilateral pleural effusions are present. There is no evidence of pneumothorax. The cardiomediastinal silhouette is unchanged. IMPRESSION: Endotracheal tube with tip 4.5 cm above carina. Little significant change in bilateral lower lung atelectasis/ airspace disease with trace bilateral pleural effusions. Electronically Signed   By: Margarette Canada M.D.   On: 10/11/2015 11:16    Assessment and plan:   Cynthia [name] is an 80 y.o. female patient with Guillain-Barr syndrome, currently intubated and although not on sedation. She is alert and follows commands. There is some mild improvement on the motor strength in bilateral upper extremities as described above. Today's a day 3 of IVIG, total planned for 5 days of infusion with a total dose of 2 g/kg. Tolerating well.  Discussed her current neurological assessment with her daughter at bedside and also with her son on phone, answered several of their questions. Patient has been expressing distress due to painful paresthesias in bilateral upper extremities. Recommend starting gabapentin 250 mg 3 times a day via NG tube, and uses Lidoderm patch topically in her upper extremities in the location causing significant distress.  We'll follow-up.

## 2015-10-14 ENCOUNTER — Inpatient Hospital Stay (HOSPITAL_COMMUNITY): Payer: Medicare Other

## 2015-10-14 ENCOUNTER — Encounter (HOSPITAL_COMMUNITY): Payer: Self-pay

## 2015-10-14 ENCOUNTER — Encounter (HOSPITAL_COMMUNITY)
Admission: EM | Disposition: A | Discharge status: Rehabilitation Facility | Payer: Self-pay | Source: Home / Self Care | Attending: Pulmonary Disease

## 2015-10-14 HISTORY — PX: ESOPHAGOGASTRODUODENOSCOPY: SHX5428

## 2015-10-14 LAB — TYPE AND SCREEN
ABO/RH(D): A POS
Antibody Screen: NEGATIVE
UNIT DIVISION: 0

## 2015-10-14 LAB — GLUCOSE, CAPILLARY
GLUCOSE-CAPILLARY: 163 mg/dL — AB (ref 65–99)
GLUCOSE-CAPILLARY: 106 mg/dL — AB (ref 65–99)

## 2015-10-14 LAB — CBC
HCT: 27.8 % — ABNORMAL LOW (ref 36.0–46.0)
Hemoglobin: 9 g/dL — ABNORMAL LOW (ref 12.0–15.0)
MCH: 28.6 pg (ref 26.0–34.0)
MCHC: 32.4 g/dL (ref 30.0–36.0)
MCV: 88.3 fL (ref 78.0–100.0)
Platelets: 330 10*3/uL (ref 150–400)
RBC: 3.15 MIL/uL — ABNORMAL LOW (ref 3.87–5.11)
RDW: 15 % (ref 11.5–15.5)
WBC: 13.9 10*3/uL — ABNORMAL HIGH (ref 4.0–10.5)

## 2015-10-14 LAB — VANCOMYCIN, TROUGH: VANCOMYCIN TR: 5 ug/mL — AB (ref 10.0–20.0)

## 2015-10-14 LAB — PROCALCITONIN: PROCALCITONIN: 0.21 ng/mL

## 2015-10-14 LAB — MAGNESIUM: Magnesium: 2 mg/dL (ref 1.7–2.4)

## 2015-10-14 SURGERY — EGD (ESOPHAGOGASTRODUODENOSCOPY)
Anesthesia: Moderate Sedation

## 2015-10-14 MED ORDER — GABAPENTIN 250 MG/5ML PO SOLN
500.0000 mg | Freq: Three times a day (TID) | ORAL | Status: DC
  Administered 2015-10-14 – 2015-10-22 (×23): 500 mg via ORAL
  Filled 2015-10-14 (×27): qty 10

## 2015-10-14 MED ORDER — FENTANYL CITRATE (PF) 100 MCG/2ML IJ SOLN
INTRAMUSCULAR | Status: DC | PRN
  Administered 2015-10-14: 25 ug via INTRAVENOUS

## 2015-10-14 MED ORDER — SODIUM CHLORIDE 0.9 % IV SOLN
25.0000 ug/h | INTRAVENOUS | Status: DC
  Administered 2015-10-14: 50 ug/h via INTRAVENOUS
  Administered 2015-10-16 – 2015-10-17 (×2): 75 ug/h via INTRAVENOUS
  Filled 2015-10-14 (×4): qty 50

## 2015-10-14 MED ORDER — VITAL AF 1.2 CAL PO LIQD
1000.0000 mL | ORAL | Status: DC
  Administered 2015-10-14 – 2015-10-22 (×7): 1000 mL
  Filled 2015-10-14 (×8): qty 1000

## 2015-10-14 MED ORDER — KCL IN DEXTROSE-NACL 20-5-0.45 MEQ/L-%-% IV SOLN
INTRAVENOUS | Status: DC
  Administered 2015-10-14: 50 mL/h via INTRAVENOUS
  Administered 2015-10-15 – 2015-10-22 (×6): via INTRAVENOUS
  Administered 2015-10-24: 30 mL/h via INTRAVENOUS
  Administered 2015-10-27: 04:00:00 via INTRAVENOUS
  Filled 2015-10-14 (×17): qty 1000

## 2015-10-14 MED ORDER — VITAL HIGH PROTEIN PO LIQD: 1000.0000 mL | ORAL | Status: DC

## 2015-10-14 MED ORDER — MIDAZOLAM HCL 10 MG/2ML IJ SOLN
INTRAMUSCULAR | Status: DC | PRN
  Administered 2015-10-14: 2 mg via INTRAVENOUS

## 2015-10-14 MED ORDER — PRO-STAT SUGAR FREE PO LIQD
30.0000 mL | Freq: Two times a day (BID) | ORAL | Status: DC
  Administered 2015-10-14 – 2015-10-26 (×24): 30 mL
  Filled 2015-10-14 (×26): qty 30

## 2015-10-14 MED ORDER — FENTANYL CITRATE (PF) 100 MCG/2ML IJ SOLN
INTRAMUSCULAR | Status: AC
  Filled 2015-10-14: qty 2

## 2015-10-14 MED ORDER — SODIUM CHLORIDE 0.9 % IV SOLN
INTRAVENOUS | Status: DC
  Administered 2015-10-14: 20 mL/h via INTRAVENOUS

## 2015-10-14 MED ORDER — VANCOMYCIN HCL IN DEXTROSE 1-5 GM/200ML-% IV SOLN
1000.0000 mg | Freq: Two times a day (BID) | INTRAVENOUS | Status: DC
  Administered 2015-10-14 – 2015-10-15 (×2): 1000 mg via INTRAVENOUS
  Filled 2015-10-14 (×3): qty 200

## 2015-10-14 MED ORDER — MIDAZOLAM HCL 5 MG/ML IJ SOLN
INTRAMUSCULAR | Status: AC
  Filled 2015-10-14: qty 2

## 2015-10-14 MED ORDER — FENTANYL BOLUS VIA INFUSION
25.0000 ug | INTRAVENOUS | Status: DC | PRN
  Administered 2015-10-15: 25 ug via INTRAVENOUS
  Filled 2015-10-14: qty 25

## 2015-10-14 NOTE — Procedures (Signed)
CORTRAK FEEDING TUBE PLACEMENT  Person Inserting Tube:  HOFFMAN, PAUL W Tube Type:  Cortrak - 43 inches Tube Location:  Right nare Initial Placement:  Stomach Cortrak Total Procedure Time:  11 Cortrak Secured At:  68 cm Procedure Comments:  KUB ordered for placement No complications

## 2015-10-14 NOTE — Progress Notes (Signed)
Pt. able to pull (-)20 cmH20 thru vent circuit with good effort/FVC performed by pt. after coaching for her to take deep breath with forced exhalation was able to exhale .85 L with good effort, RT to monitor.

## 2015-10-14 NOTE — Progress Notes (Signed)
Interval History:                                                                                                                      Cynthia [name] is an 80 y.o. female patient with Guillain-Barr syndrome, on IVIG at a 4. Nursing staff informed that she is been making progress able to elevate her upper extremities better today. During my evaluation she is receiving a fentanyl has been sedated, sleeping. She remains intubated. Her son was at bedside.  Past Medical History: Past Medical History  Diagnosis Date  . Anemia   . Blood transfusion without reported diagnosis jan 2015  . Heart murmur   . Hypertension     no bp meds   . Liver cyst   . C. difficile colitis 03/05/14, 03/22/14, 04/05/14    recurrent  . Stroke ([place]) 12/2014    NUMBNESS ON LEFT SIDE  . Cervical cancer ([place]) 1972  . Colon cancer ([place]) JAN 2015  . Colon cancer ([place])   . Cancer ([place])     liver  . Complication of anesthesia     slow to awaken in past, DONE WELL RECENTLY  . Tinnitus of both ears ALL THE TIME  . DVT (deep venous thrombosis) ([place]) 10/24/13    Right leg    Past Surgical History  Procedure Laterality Date  . Back surgery  1981    lower lumbar  . Tubal ligation  1968  . Laparoscopic right colectomy Right 10/22/2013    Procedure: LAPAROSCOPIC RIGHT COLECTOMY ;  Surgeon: [name], MD;  Location: [facility];  Service: General;  Laterality: Right;  . Salpingoophorectomy  10/22/2013    Procedure: [place] Saa;  Surgeon: [name]. [name], MD;  Location: [facility];  Service: Gynecology;;  . Abdominal hysterectomy      vaginal- partial    Family History: Family History  Problem Relation Age of Onset  . Diabetes Sister   . Cancer Sister     leukemia  . Diabetes Brother   . Heart failure Brother   . Hypertension Brother   . Breast cancer Maternal Aunt   . Rheum arthritis Mother   . Heart failure Father   . Cancer Maternal Grandmother     GIST  . Heart failure Maternal  Grandfather   . Aneurysm Paternal Grandmother     Social History:   reports that she has never smoked. She has never used smokeless tobacco. She reports that she does not drink alcohol or use illicit drugs.  Allergies:  No Known Allergies   Medications:  Current facility-administered medications:  .  antiseptic oral rinse solution (CORINZ), 7 mL, Mouth Rinse, QID, Erick Colace, NP, 7 mL at 10/14/15 1650 .  chlorhexidine gluconate (PERIDEX) 0.12 % solution 15 mL, 15 mL, Mouth Rinse, BID, Erick Colace, NP, 15 mL at 10/14/15 1920 .  dextrose 5 % and 0.45 % NaCl with KCl 20 mEq/L infusion, , Intravenous, Continuous, Chesley Mires, MD, Last Rate: 50 mL/hr at 10/14/15 0952, 50 mL/hr at 10/14/15 0952 .  feeding supplement (PRO-STAT SUGAR FREE 64) liquid 30 mL, 30 mL, Per Tube, BID, Chesley Mires, MD, 30 mL at 10/14/15 1535 .  feeding supplement (VITAL AF 1.2 CAL) liquid 1,000 mL, 1,000 mL, Per Tube, Continuous, Chesley Mires, MD, Last Rate: 20 mL/hr at 10/14/15 1535, 1,000 mL at 10/14/15 1535 .  fentaNYL (SUBLIMAZE) 2,500 mcg in sodium chloride 0.9 % 250 mL (10 mcg/mL) infusion, 25-400 mcg/hr, Intravenous, Continuous, Praveen Mannam, MD, Last Rate: 5 mL/hr at 10/14/15 1742, 50 mcg/hr at 10/14/15 1742 .  fentaNYL (SUBLIMAZE) bolus via infusion 25 mcg, 25 mcg, Intravenous, Q1H PRN, Praveen Mannam, MD .  gabapentin (NEURONTIN) 250 MG/5ML solution 500 mg, 500 mg, Oral, 3 times per day, Metztli Sachdev Fuller Mandril, MD .  hydrALAZINE (APRESOLINE) injection 10 mg, 10 mg, Intravenous, Q4H PRN, Chesley Mires, MD, 10 mg at 10/14/15 0242 .  Immune Globulin 10% (OCTAGAM) IV infusion 25 g, 400 mg/kg, Intravenous, Q24 Hr x 5, Kerney Elbe, MD, Last Rate: 37 mL/hr at 10/14/15 1756, 25 g at 10/14/15 1756 .  lidocaine (LIDODERM) 5 % 2 patch, 2 patch, Transdermal, Q24H, Jay Haskew Fuller Mandril, MD,  2 patch at 10/13/15 1937 .  pantoprazole (PROTONIX) injection 40 mg, 40 mg, Intravenous, Q12H, Oswald Hillock, MD, 40 mg at 10/14/15 0902 .  piperacillin-tazobactam (ZOSYN) IVPB 3.375 g, 3.375 g, Intravenous, Q8H, Romona Curls, RPH, 3.375 g at 10/14/15 1912 .  vancomycin (VANCOCIN) IVPB 1000 mg/200 mL premix, 1,000 mg, Intravenous, Q12H, Karren Cobble, RPH, 1,000 mg at 10/14/15 1235   Neurologic Examination:                                                                                                      Blood pressure 103/61, pulse 72, temperature 97.5 F (36.4 C), temperature source Oral, resp. rate 18, height 5\' 2"  (1.575 m), weight 62.097 kg (136 lb 14.4 oz), SpO2 100 %. Patient intubated, sedated with fentanyl. Sleeping   Lab Results: Basic Metabolic Panel:  Recent Labs Lab 10/08/15 0521  10/11/15 0930 10/12/15 0347 10/12/15 2039 10/13/15 0415 10/13/15 2008 10/14/15 0456  NA  --   < > 134* 133* 138 141 139  --   K  --   < > 3.4* 2.7* 3.3* 2.9* 3.9  --   CL  --   < > 99* 100* 101 104 105  --   CO2  --   < > 22 25 25 26 26   --   GLUCOSE  --   < > 136* 116* 132* 111* 118*  --   BUN  --   < >  15 14 15 14 14   --   CREATININE  --   < > 0.53 0.52 0.63 0.66 0.54  --   CALCIUM  --   < > 8.6* 7.9* 8.0* 7.6* 8.1*  --   MG 1.9  --  1.8  --   --  1.8  --  2.0  < > = values in this interval not displayed.  Liver Function Tests:  Recent Labs Lab 10/09/15 1030 10/11/15 0930  AST 36 37  ALT 24 24  ALKPHOS 40 53  BILITOT 0.5 0.8  PROT 5.8* 5.8*  ALBUMIN 2.5* 2.5*   No results for input(s): LIPASE, AMYLASE in the last 168 hours. No results for input(s): AMMONIA in the last 168 hours.  CBC:  Recent Labs Lab 10/09/15 1400 10/11/15 0930 10/12/15 0347 10/13/15 0415 10/13/15 1500 10/14/15 0456  WBC 15.8* 17.4* 13.7* 12.4*  --  13.9*  NEUTROABS  --  15.3*  --   --   --   --   HGB 10.0* 8.4* 7.5* 6.7* 9.4* 9.0*  HCT 29.7* 25.4* 22.7* 19.7* 29.4* 27.8*  MCV 86.6  85.5 86.0 87.6  --  88.3  PLT 458* 455* 388 304  --  330    Cardiac Enzymes:  Recent Labs Lab 10/11/15 0930 10/11/15 1738 10/11/15 2214  TROPONINI 0.17* 0.22* 0.20*    Lipid Panel: No results for input(s): CHOL, TRIG, HDL, CHOLHDL, VLDL, LDLCALC in the last 168 hours.  CBG:  Recent Labs Lab 10/14/15 1602 10/14/15 1922  GLUCAP 163* 106*    Microbiology: Results for orders placed or performed during the hospital encounter of 10/06/15  MRSA PCR Screening     Status: None   Collection Time: 10/09/15 12:58 PM  Result Value Ref Range Status   MRSA by PCR NEGATIVE NEGATIVE Final    Comment:        The GeneXpert MRSA Assay (FDA approved for NASAL specimens only), is one component of a comprehensive MRSA colonization surveillance program. It is not intended to diagnose MRSA infection nor to guide or monitor treatment for MRSA infections.   Urine culture     Status: None   Collection Time: 10/11/15  9:36 AM  Result Value Ref Range Status   Specimen Description URINE, RANDOM  Final   Special Requests NONE  Final   Culture NO GROWTH 1 DAY  Final   Report Status 10/12/2015 FINAL  Final  Culture, respiratory (NON-Expectorated)     Status: None (Preliminary result)   Collection Time: 10/11/15 10:45 AM  Result Value Ref Range Status   Specimen Description TRACHEAL ASPIRATE  Final   Special Requests NONE  Final   Gram Stain   Final    RARE WBC NO SQUAMOUS EPITHELIAL CELLS SEEN FEW GRAM POSITIVE COCCI IN CLUSTERS Performed at [facility]    Culture   Final    Culture reincubated for better growth Performed at [facility]    Report Status PENDING  Incomplete  Culture, blood (routine x 2)     Status: None (Preliminary result)   Collection Time: 10/11/15 11:45 AM  Result Value Ref Range Status   Specimen Description BLOOD LEFT ANTECUBITAL  Final   Special Requests BOTTLES DRAWN AEROBIC AND ANAEROBIC  5CC  Final   Culture NO GROWTH 3 DAYS  Final    Report Status PENDING  Incomplete  Culture, blood (routine x 2)     Status: None (Preliminary result)   Collection Time: 10/11/15 11:52 AM  Result  Value Ref Range Status   Specimen Description BLOOD LEFT ARM  Final   Special Requests IN PEDIATRIC BOTTLE  2CC  Final   Culture NO GROWTH 3 DAYS  Final   Report Status PENDING  Incomplete    Imaging: Mr Thoracic Spine W Wo Contrast  10/11/2015  CLINICAL DATA:  Colon cancer. Progressive lower extremity weakness. New onset of upper extremity motor weakness. Height but reflexia of the upper extremities has progressed. The lower extremities are without reflexes. Decreased tone. Question Guillain-Barre syndrome. EXAM: MRI THORACIC AND LUMBAR SPINE WITHOUT AND WITH CONTRAST TECHNIQUE: Multiplanar and multiecho pulse sequences of the thoracic and lumbar spine were obtained without and with intravenous contrast. CONTRAST:  45mL MULTIHANCE GADOBENATE DIMEGLUMINE 529 MG/ML IV SOLN COMPARISON:  MRI of the brain 10/06/2015. MRI of the cervical spine 10/08/2015. FINDINGS: MR THORACIC SPINE FINDINGS Normal signal is present throughout the thoracic spinal cord. The conus medullaris terminates at L1, within normal limits. Heterogeneous marrow signal is present. There is no focal enhancement to suggest metastatic disease. Posterior hemangiomas are present at T6 and T7. Vertebral body heights are maintained throughout the thoracic spine. Bilateral pleural effusions are present, right greater than left. There is associated volume loss and atelectasis. Mild leftward curvature is present in the upper thoracic spine, centered at T5. No significant focal disc protrusion is present within the thoracic spine. Asymmetric left-sided facet hypertrophy is present at T9-10 results in mild left foraminal stenosis. The foramina are otherwise patent bilaterally. There is some enhancement about the distal spinal cord and particularly within the nerve roots. MR LUMBAR SPINE FINDINGS Normal  signal is present in the conus medullaris which terminates at L2. A prominent hemangioma is noted posteriorly at the L1 level. There is chronic fatty endplate marrow change at L5-S1. Limited imaging of the abdomen demonstrates atherosclerotic changes in the aorta without aneurysm. Prominent hepatic cysts are again noted. L1-2: Mild facet hypertrophy is noted bilaterally without significant stenosis. L2-3: Mild facet hypertrophy is present bilaterally. There is mild disc bulging. There is no significant stenosis. L3-4: A broad-based disc protrusion is present. Mild facet hypertrophy is evident bilaterally. A left laminectomy is noted. There is no residual or recurrent stenosis. L4-5: A left laminectomy is noted. Mild facet hypertrophy is present bilaterally. A broad-based disc protrusion is worse on the right. This results in mild subarticular and foraminal narrowing bilaterally. L5-S1: A left laminectomy is noted. Mild facet hypertrophy is worse on the left. Mild foraminal narrowing is also worse on the left. Both anterior and posterior nerve roots demonstrate enhancement without significant enlargement. IMPRESSION: 1. No evidence for metastatic disease to the thoracic or lumbar spine. 2. Peripheral enhancement of the distal thoracic spinal cord is well is the nerve roots with anterior and posterior suggesting Guillain-Barre. No discrete mass lesion is present. Other inflammatory or autoimmune process ease or metastatic disease are considered less likely given the uniformity and lack of significant nerve root enlargement. 3. Left laminectomies at L3-4, L4-5, and L5-S1. 4. Mild foraminal narrowing bilaterally at L4-5 and L5-S1. 5. Mild subarticular narrowing bilaterally at L4-5 despite laminectomy. 6. Mild left foraminal narrowing at T9-10 due to facet disease. Electronically Signed   By: San Morelle M.D.   On: 10/11/2015 17:35   Mr Lumbar Spine W Wo Contrast  10/11/2015  : Please see lumbar spine report  by Dr. Jobe Igo, under accession [id number] Electronically Signed   By: Van Clines M.D.   On: 10/11/2015 17:52   Dg Chest [facility]  10/14/2015  CLINICAL DATA:  Followup respiratory failure EXAM: PORTABLE CHEST 1 VIEW COMPARISON:  10/13/2015 FINDINGS: Endotracheal tube, nasogastric catheter and right jugular sheath are again seen and stable. The cardiac shadow is stable. Increased density is noted in the left lung base but slightly increased from the previous exam. Mild right basilar atelectasis remains. No bony abnormality is seen. IMPRESSION: Bibasilar changes with slight progression on the left. Electronically Signed   By: Inez Catalina M.D.   On: 10/14/2015 07:51   Dg Chest Port 1 View  10/13/2015  CLINICAL DATA:  Respiratory failure EXAM: PORTABLE CHEST 1 VIEW COMPARISON:  10/12/2015 FINDINGS: Endotracheal tube and nasogastric catheter are again seen and stable. A right jugular central the sheath is noted and stable. Small bilateral pleural effusions are again identified. Slight increased progression in the degree of left lower lobe consolidation is noted IMPRESSION: Small bilateral pleural effusions. Tubes and lines as described above. Progressive consolidation in the left lower lobe. Electronically Signed   By: Inez Catalina M.D.   On: 10/13/2015 07:43   Portable Chest Xray  10/12/2015  CLINICAL DATA:  Intubation . EXAM: PORTABLE CHEST 1 VIEW COMPARISON:  10/11/2015. FINDINGS: Endotracheal tube, right IJ central line, NG tube in stable position. Cardiomegaly with normal pulmonary vascularity. Low lung volumes with basilar atelectasis and/or infiltrates. Small bilateral pleural effusions. IMPRESSION: 1. Lines and tubes in stable position. 2. Low lung volumes with bibasilar atelectasis and/or infiltrates. Small bilateral pleural effusions. Electronically Signed   By: Marcello Moores  Register   On: 10/12/2015 07:37   Portable Chest Xray  10/11/2015  CLINICAL DATA:  Acute respiratory failure  -endotracheal tube placement. EXAM: PORTABLE CHEST 1 VIEW COMPARISON:  10/09/2015 and prior exams FINDINGS: Endotracheal tube with tip 4.5 cm above carina is now noted. NG tube with tip overlying the mid stomach and right IJ central venous catheter sheath again noted. Bilateral lower lung opacities are noted. Trace bilateral pleural effusions are present. There is no evidence of pneumothorax. The cardiomediastinal silhouette is unchanged. IMPRESSION: Endotracheal tube with tip 4.5 cm above carina. Little significant change in bilateral lower lung atelectasis/ airspace disease with trace bilateral pleural effusions. Electronically Signed   By: Margarette Canada M.D.   On: 10/11/2015 11:16   Dg Abd Portable 1v  10/14/2015  CLINICAL DATA:  NG tube placement EXAM: PORTABLE ABDOMEN - 1 VIEW COMPARISON:  None FINDINGS: Feeding tube is in place with the tip in the peripyloric region. Nonobstructive bowel gas pattern. IMPRESSION: Feeding tube tip in the peripyloric region. Electronically Signed   By: Rolm Baptise M.D.   On: 10/14/2015 12:39    Assessment and plan:   Cynthia [name] is an 80 y.o. female patient with Guillain-Barr syndrome, on IVIG day 4 today. Per nursing staff report, she has been making gradual progress with her motor strength in upper extremities. Her distress from painful paresthesias in her upper extremities have improved gabapentin although requires fentanyl due to some residual distress. Recommend increasing the gabapentin dose to 500 mg every 8 hours, and reduce use of fentanyl as tolerated.  Tomorrow is the last day of IVIG.  Discussed with her son at bedside, answered several of his questions  We'll follow-up.

## 2015-10-14 NOTE — Progress Notes (Signed)
PULMONARY / CRITICAL CARE MEDICINE   Name: Cynthia [name] MRN: [id number] DOB: 05/26/1936    ADMISSION DATE:  10/06/2015 CONSULTATION DATE: 10/09/15  REFERRING MD: [facility]  CHIEF COMPLAINT: Paresthesia  SUBJECTIVE:  Pain in extremities improved.  Tolerating some pressure support.  VITAL SIGNS: BP 147/77 mmHg  Pulse 85  Temp(Src) 97 F (36.1 C) (Axillary)  Resp 18  Ht 5\' 2"  (1.575 m)  Wt 136 lb 14.4 oz (62.097 kg)  BMI 25.03 kg/m2  SpO2 99%  VENTILATOR SETTINGS: Vent Mode:  [-] PSV;CPAP FiO2 (%):  [40 %] 40 % Set Rate:  [14 bmp] 14 bmp Vt Set:  [400 mL] 400 mL PEEP:  [5 cmH20] 5 cmH20 Pressure Support:  [10 cmH20] 10 cmH20 Plateau Pressure:  [11 cmH20-15 cmH20] 15 cmH20   INTAKE / OUTPUT: I/O last 3 completed shifts: In: 2408.8 [I.V.:1478.8; Blood:330; NG/GT:200; IV Piggyback:400] Out: 2170 [id number]; Emesis/NG output:580]   PHYSICAL EXAMINATION: General: ill appearing Neuro: RASS 0, improved movement in upper extremities HEENT: ETT in place Cardiac: regular, no murmur Chest: better air movement Abd: soft, non tender Ext: no edema Skin: no rashes  LABS:  BMET  Recent Labs Lab 10/12/15 2039 10/13/15 0415 10/13/15 2008  NA 138 141 139  K 3.3* 2.9* 3.9  CL 101 104 105  CO2 25 26 26   BUN 15 14 14   CREATININE 0.63 0.66 0.54  GLUCOSE 132* 111* 118*    Electrolytes  Recent Labs Lab 10/11/15 0930  10/12/15 2039 10/13/15 0415 10/13/15 2008 10/14/15 0456  CALCIUM 8.6*  < > 8.0* 7.6* 8.1*  --   MG 1.8  --   --  1.8  --  2.0  < > = values in this interval not displayed.  CBC  Recent Labs Lab 10/12/15 0347 10/13/15 0415 10/13/15 1500 10/14/15 0456  WBC 13.7* 12.4*  --  13.9*  HGB 7.5* 6.7* 9.4* 9.0*  HCT 22.7* 19.7* 29.4* 27.8*  PLT 388 304  --  330    Coag's  Recent Labs Lab 10/12/15 1200  APTT 27    Sepsis Markers  Recent Labs Lab 10/09/15 1047 10/09/15 1343  10/11/15 0930 10/12/15 0347 10/13/15 0415 10/14/15 0456   LATICACIDVEN 3.9* 1.7  --  1.5  --   --   --   PROCALCITON  --   --   < > 0.96 0.61 0.33 0.21  < > = values in this interval not displayed.  ABG  Recent Labs Lab 10/09/15 1123 10/11/15 1141  PHART 7.354 7.437  PCO2ART 33.0* 39.1  PO2ART 59.0* 393.0*    Liver Enzymes  Recent Labs Lab 10/07/15 1041 10/09/15 1030 10/11/15 0930  AST 30 36 37  ALT 32 24 24  ALKPHOS 50 40 53  BILITOT 0.5 0.5 0.8  ALBUMIN 3.1* 2.5* 2.5*    Cardiac Enzymes  Recent Labs Lab 10/11/15 0930 10/11/15 1738 10/11/15 2214  TROPONINI 0.17* 0.22* 0.20*    Glucose No results for input(s): GLUCAP in the last 168 hours.  Imaging Dg Chest Port 1 View  10/14/2015  CLINICAL DATA:  Followup respiratory failure EXAM: PORTABLE CHEST 1 VIEW COMPARISON:  10/13/2015 FINDINGS: Endotracheal tube, nasogastric catheter and right jugular sheath are again seen and stable. The cardiac shadow is stable. Increased density is noted in the left lung base but slightly increased from the previous exam. Mild right basilar atelectasis remains. No bony abnormality is seen. IMPRESSION: Bibasilar changes with slight progression on the left. Electronically Signed   By: Inez Catalina  M.D.   On: 10/14/2015 07:51     STUDIES:  2/21 CT head >> no acute findings 2/21 MRI brain >> chronic microvascular ischemic changes 2/23 Echo >> EF 60 to 65%, mild AR, mild MR 2/23 MRI c spine >> stenosis 2/26 MRI t/l spine >> Peripheral enhancement of the distal thoracic spinal cord is well is the nerve roots with anterior and posterior suggesting Guillain-Barre  CULTURES: 2/26 Urine >> negative 2/26 Sputum >> 2/26 Blood >>  ANTIBIOTICS: 2/26 Vancomycin >> 2/26 Zosyn >>  SIGNIFICANT EVENTS: 2/21 Admit 2/22 neurology consulted 2/24 Coffee ground emesis, hypotension >> to ICU; GI consulted 2/26 Respiratory worse >> changed code status >> intubated; start IVIG 2/27 Off pressors 2/28 Transfuse 1 unit PRBC  LINES/TUBES: 2/24 Rt IJ  CVL >>  2/26 ETT >>   DISCUSSION: 80 yo female presented with paresthesia in upper/lower extremities.  This was associated with gait ataxia.  She developed coffee ground emesis and hypotension >> transferred to ICU.  She has hx of CVA in May 2016, metastatic colon cancer s/p Rt colectomy and xeloda (last in November 2015) and s/p ablation of liver lesion by IR 10/06/15, and recurrent C diff.  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 Neuropathic pain. P:   Day 4/5 of IVIG RASS goal 0 to -1 Continue neurontin, lidoderm patch, fentanyl gtt  PULMONARY A: Acute hypoxic respiratory failure. P Pressure support wean as tolerated F/u CXR  CARDIAC A: Hypotension 2nd to GI bleed 2/24 >> resolved. Hypertension. P Monitor hemodynamics Prn hydralazine for SBP > 160  GASTROENTEROLOGY A: Acute upper GI bleed 2/24. P:   For EGD 3/01 Continue protonix bid Add tube feeds when okay with GI  RENAL A: Hypokalemia. Hypomagnesemia. P: Replace electrolytes as needed  HEMATOLOGY A: Anemia 2nd to GI bleed, critical illness, chronic disease. P: F/u CBC Transfuse for Hb < 7 SCD's for DVT prevention  INFECTIOUS DISEASE. A: HCAP. P: Day 4 of vancomycin, zosyn  ENDOCRINE A: Hyperglycemia. P: Monitor blood sugar on BMET  Goals of Care: Was initially DNI >> code status reversed 2/26.  Initial plan to re-assess after 5 to 7 days on vent.   CC time 32 minutes.  Chesley Mires, MD [facility] Pulmonary/Critical Care 10/14/2015, 8:51 AM Pager:  [phone] After 3pm call: [phone]

## 2015-10-14 NOTE — Progress Notes (Signed)
Patient was able to pull (-20) for NIF through vent circuit with great effort. Patient VC was .76 L,, good effort.

## 2015-10-14 NOTE — Progress Notes (Addendum)
Nutrition Follow Up  DOCUMENTATION CODES:   Not applicable  INTERVENTION:    Initiate Vital AF 1.2 formula at 20 ml/hr and increase by 10 ml every 8 hours to goal rate of 40 ml/hr.   Prostat liquid protein 30 ml BID.  Total TF regimen to provide 1352 kcals, 102 gm protein, 803 ml of free water  NUTRITION DIAGNOSIS:   Inadequate oral intake related to inability to eat as evidenced by NPO status, ongoing  GOAL:   Patient will meet greater than or equal to 90% of their needs, progressing  MONITOR:   TF tolerance, Vent status, Labs, Weight trends, Skin, I & O's  ASSESSMENT:   80 yo Female with metastatic colon cancer, and with neurologic issues of numbness tingling increased weakness. Stay complicated by GI bleed. Transfer to the ICU for hypotension.   Patient s/p procedure 3/1: EGD, DIAGNOSTIC -- impression -- no active bleeding  Patient is currently intubated on ventilator support MV: 8.2 L/min Temp (24hrs), Avg:97.4 F (36.3 C), Min:96.5 F (35.8 C), Max:98.6 F (37 C)   MRI showed GuilIain Barr.  Receiving IVIG.  Ok to start TF per GI.  CORTRAK feeding tube in place.  KUB pending.  Will place TF orders.  Diet Order:  Diet NPO time specified  Skin:  Reviewed, no issues  Last BM:  2/28  Height:   Ht Readings from Last 1 Encounters:  10/06/15 5\' 2"  (1.575 m)    Weight:   Wt Readings from Last 1 Encounters:  10/06/15 136 lb 14.4 oz (62.097 kg)    Ideal Body Weight:  50 kg  BMI:  Body mass index is 25.03 kg/(m^2).  Estimated Nutritional Needs:   Kcal:  1239  Protein:  95-105 gm  Fluid:  per MD  EDUCATION NEEDS:   No education needs identified at this time  Arthur Holms, RD, LDN Pager #: [phone] After-Hours Pager #: [phone]

## 2015-10-14 NOTE — Progress Notes (Signed)
ANTIBIOTIC CONSULT NOTE - INITIAL  Pharmacy Consult for Vancomycin/Zosyn Indication: HCAP  No Known Allergies  Patient Measurements: Height: 5\' 2"  (157.5 cm) Weight: 136 lb 14.4 oz (62.097 kg) IBW/kg (Calculated) : 50.1 Adjusted Body Weight:    Vital Signs: Temp: 97 F (36.1 C) (03/01 0824) Temp Source: Axillary (03/01 0824) BP: 141/75 mmHg (03/01 0900) Pulse Rate: 83 (03/01 0900) Intake/Output from previous day: 02/28 0701 - 03/01 0700 In: 2353.8 [I.V.:1473.8; Blood:330; NG/GT:200; IV Piggyback:350] Out: 1440 [Urine:1360; Emesis/NG output:80] Intake/Output from this shift: Total I/O In: 114.7 [I.V.:114.7] Out: 45 [Urine:45]  Labs:  Recent Labs  10/12/15 0347 10/12/15 2039 10/13/15 0415 10/13/15 1500 10/13/15 2008 10/14/15 0456  WBC 13.7*  --  12.4*  --   --  13.9*  HGB 7.5*  --  6.7* 9.4*  --  9.0*  PLT 388  --  304  --   --  330  CREATININE 0.52 0.63 0.66  --  0.54  --    Estimated Creatinine Clearance: 51 mL/min (by C-G formula based on Cr of 0.54). No results for input(s): VANCOTROUGH, VANCOPEAK, VANCORANDOM, GENTTROUGH, GENTPEAK, GENTRANDOM, TOBRATROUGH, TOBRAPEAK, TOBRARND, AMIKACINPEAK, AMIKACINTROU, AMIKACIN in the last 72 hours.   Microbiology: Recent Results (from the past 720 hour(s))  MRSA PCR Screening     Status: None   Collection Time: 10/09/15 12:58 PM  Result Value Ref Range Status   MRSA by PCR NEGATIVE NEGATIVE Final    Comment:        The GeneXpert MRSA Assay (FDA approved for NASAL specimens only), is one component of a comprehensive MRSA colonization surveillance program. It is not intended to diagnose MRSA infection nor to guide or monitor treatment for MRSA infections.   Urine culture     Status: None   Collection Time: 10/11/15  9:36 AM  Result Value Ref Range Status   Specimen Description URINE, RANDOM  Final   Special Requests NONE  Final   Culture NO GROWTH 1 DAY  Final   Report Status 10/12/2015 FINAL  Final  Culture,  respiratory (NON-Expectorated)     Status: None (Preliminary result)   Collection Time: 10/11/15 10:45 AM  Result Value Ref Range Status   Specimen Description TRACHEAL ASPIRATE  Final   Special Requests NONE  Final   Gram Stain   Final    RARE WBC NO SQUAMOUS EPITHELIAL CELLS SEEN FEW GRAM POSITIVE COCCI IN CLUSTERS Performed at [facility]    Culture   Final    Culture reincubated for better growth Performed at [facility]    Report Status PENDING  Incomplete  Culture, blood (routine x 2)     Status: None (Preliminary result)   Collection Time: 10/11/15 11:45 AM  Result Value Ref Range Status   Specimen Description BLOOD LEFT ANTECUBITAL  Final   Special Requests BOTTLES DRAWN AEROBIC AND ANAEROBIC  5CC  Final   Culture NO GROWTH 2 DAYS  Final   Report Status PENDING  Incomplete  Culture, blood (routine x 2)     Status: None (Preliminary result)   Collection Time: 10/11/15 11:52 AM  Result Value Ref Range Status   Specimen Description BLOOD LEFT ARM  Final   Special Requests IN PEDIATRIC BOTTLE  2CC  Final   Culture NO GROWTH 2 DAYS  Final   Report Status PENDING  Incomplete    Medical History: Past Medical History  Diagnosis Date  . Anemia   . Blood transfusion without reported diagnosis jan 2015  .  Heart murmur   . Hypertension     no bp meds   . Liver cyst   . C. difficile colitis 03/05/14, 03/22/14, 04/05/14    recurrent  . Stroke ([place]) 12/2014    NUMBNESS ON LEFT SIDE  . Cervical cancer ([place]) 1972  . Colon cancer ([place]) JAN 2015  . Colon cancer ([place])   . Cancer ([place])     liver  . Complication of anesthesia     slow to awaken in past, DONE WELL RECENTLY  . Tinnitus of both ears ALL THE TIME  . DVT (deep venous thrombosis) ([place]) 10/24/13    Right leg   Assessment: Cynthia [name] is a 80 y.o. female admitted on 10/06/2015 with pneumonia.  Increased weakness, respiratory distress, intubated on 2/26.  Hx metastatic colon cancer. Afeb, wbc 17.4  (up). SCr fluctuating 0.65>1.01>0.53, CrCl~51.  Infectious Disease: Vanc/Zosyn D#4 for r/o HAP. Afeb (low 96.5), wbc down 17.4 > 13.7> 12.4>13.9 back up, LA trend down to 1.5. CrCl 51. IVIG x 5d. PC 0.21. (Also with h/o recurrent Cdiff). All doses have been charted.  2/26 Vanc >>  - 3/1 VT 5: Incr to 1g IV q12h (15mg /kg) 2/26 Zosyn >>   2/26 BCx: ngtd 2/26 Sputum: few GPC in clusters 2/26 UC: negative final 2/26 MRSA PCR: neg  Goal of Therapy:  Vancomycin trough level 15-20 mcg/ml  Plan:  Zosyn 3.375g Iv q8hr Change Vancomycin to 1g IV q12 hrs today (closer to 15mg /kg) Trough again after 3-5 doses at [facility] if continued.   Cynthia [name], PharmD, BCPS Clinical Staff Pharmacist Pager [phone]  Eilene Ghazi Stillinger 10/14/2015,9:31 AM

## 2015-10-14 NOTE — Progress Notes (Signed)
Utilization Review Completed.  

## 2015-10-14 NOTE — Op Note (Signed)
[facility] [place], [address]   ENDOSCOPY PROCEDURE REPORT  PATIENT: Cynthia, [name]  MR#: [id number] BIRTHDATE: October 13, 1935 , 79  yrs. old GENDER: female ENDOSCOPIST: Clarene Essex, MD REFERRED BY: PROCEDURE DATE:  10-21-2015 PROCEDURE:  EGD, diagnostic ASA CLASS:     Class IV INDICATIONS:  hematemesis. MEDICATIONS: Fentanyl 25 mcg IV and Versed 2 mg IV TOPICAL ANESTHETIC: none  DESCRIPTION OF PROCEDURE: After the risks benefits and alternatives of the procedure were thoroughly explained, informed consent was obtained.  The Pentax Gastroscope [id number] endoscope was introduced through the mouth and advanced to the second portion of the duodenum ,after her NG tube was removed in the customary fashion Without limitations.  The instrument was slowly withdrawn as the mucosa was fully examined. Estimated blood loss is zero unless otherwise noted in this procedure report.    The findings are recorded below       Retroflexed views revealed no abnormalities.     The scope was then withdrawn from the patient and the procedure completed.  COMPLICATIONS: There were no immediate complications.  ENDOSCOPIC IMPRESSION: 1. No active bleeding seen 2. Tiny to small hiatal hernia with minimal NG tube trauma 3. Proximal linear ulcer questionable from NG tube 4. Otherwise within normal limits to the second portion of the duodenum  RECOMMENDATIONS: continue pump inhibitors okay to begin tube feeds and okay to hold off on further NG suction for now and okay to use blood thinners if need be and please call us if we could be of any further assistance  REPEAT EXAM: as needed  eSigned:  Clarene Essex, MD 10-21-15 11:04 AM    CC:  CPT CODES: ICD CODES:  The ICD and CPT codes recommended by this software are interpretations from the data that the clinical staff has captured with the software.  The verification of the translation of this report  to the ICD and CPT codes and modifiers is the sole responsibility of the health care institution and practicing physician where this report was generated.  [employer] will not be held responsible for the validity of the ICD and CPT codes included on this report.  AMA assumes no liability for data contained or not contained herein. CPT is a Designer, television/film set of the [employer].  PATIENT NAME:  Cynthia, [name] MR#: [id number]

## 2015-10-15 ENCOUNTER — Inpatient Hospital Stay (HOSPITAL_COMMUNITY): Payer: Medicare Other

## 2015-10-15 ENCOUNTER — Encounter (HOSPITAL_COMMUNITY): Payer: Self-pay | Admitting: Gastroenterology

## 2015-10-15 LAB — CBC
HCT: 26.7 % — ABNORMAL LOW (ref 36.0–46.0)
Hemoglobin: 8.6 g/dL — ABNORMAL LOW (ref 12.0–15.0)
MCH: 28.6 pg (ref 26.0–34.0)
MCHC: 32.2 g/dL (ref 30.0–36.0)
MCV: 88.7 fL (ref 78.0–100.0)
PLATELETS: 286 10*3/uL (ref 150–400)
RBC: 3.01 MIL/uL — AB (ref 3.87–5.11)
RDW: 15.2 % (ref 11.5–15.5)
WBC: 10.7 10*3/uL — AB (ref 4.0–10.5)

## 2015-10-15 LAB — GLUCOSE, CAPILLARY
GLUCOSE-CAPILLARY: 132 mg/dL — AB (ref 65–99)
GLUCOSE-CAPILLARY: 120 mg/dL — AB (ref 65–99)
GLUCOSE-CAPILLARY: 115 mg/dL — AB (ref 65–99)
Glucose-Capillary: 108 mg/dL — ABNORMAL HIGH (ref 65–99)
Glucose-Capillary: 136 mg/dL — ABNORMAL HIGH (ref 65–99)
Glucose-Capillary: 143 mg/dL — ABNORMAL HIGH (ref 65–99)

## 2015-10-15 LAB — BASIC METABOLIC PANEL
ANION GAP: 3 — AB (ref 5–15)
BUN: 21 mg/dL — ABNORMAL HIGH (ref 6–20)
CO2: 28 mmol/L (ref 22–32)
Calcium: 7.9 mg/dL — ABNORMAL LOW (ref 8.9–10.3)
Chloride: 105 mmol/L (ref 101–111)
Creatinine, Ser: 0.51 mg/dL (ref 0.44–1.00)
GFR calc Af Amer: >60 mL/min (ref >60)
GLUCOSE: 156 mg/dL — AB (ref 65–99)
POTASSIUM: 3.4 mmol/L — AB (ref 3.5–5.1)
SODIUM: 136 mmol/L (ref 135–145)

## 2015-10-15 LAB — CULTURE, RESPIRATORY

## 2015-10-15 LAB — CULTURE, RESPIRATORY W GRAM STAIN: Culture: NORMAL

## 2015-10-15 LAB — PROCALCITONIN: PROCALCITONIN: 0.17 ng/mL

## 2015-10-15 MED ORDER — CHLORHEXIDINE GLUCONATE 0.12% ORAL RINSE (MEDLINE KIT)
15.0000 mL | Freq: Two times a day (BID) | OROMUCOSAL | Status: DC
  Administered 2015-10-15 – 2015-10-18 (×6): 15 mL via OROMUCOSAL

## 2015-10-15 MED ORDER — PANTOPRAZOLE SODIUM 40 MG PO PACK
40.0000 mg | PACK | ORAL | Status: DC
  Administered 2015-10-16 – 2015-10-22 (×7): 40 mg
  Filled 2015-10-15 (×10): qty 20

## 2015-10-15 MED ORDER — POTASSIUM CHLORIDE 20 MEQ/15ML (10%) PO SOLN
20.0000 meq | ORAL | Status: AC
  Administered 2015-10-15 (×2): 20 meq
  Filled 2015-10-15 (×2): qty 15

## 2015-10-15 MED ORDER — GERHARDT'S BUTT CREAM
TOPICAL_CREAM | Freq: Four times a day (QID) | CUTANEOUS | Status: DC
  Administered 2015-10-15 (×4): via TOPICAL
  Administered 2015-10-16 (×3): 1 via TOPICAL
  Administered 2015-10-16 – 2015-10-17 (×4): via TOPICAL
  Administered 2015-10-17: 1 via TOPICAL
  Administered 2015-10-18 (×2): via TOPICAL
  Administered 2015-10-18: 1 via TOPICAL
  Administered 2015-10-18 – 2015-10-19 (×2): via TOPICAL
  Administered 2015-10-19 (×2): 1 via TOPICAL
  Administered 2015-10-19: 10:00:00 via TOPICAL
  Administered 2015-10-20: 1 via TOPICAL
  Administered 2015-10-20: 23:00:00 via TOPICAL
  Administered 2015-10-20: 1 via TOPICAL
  Administered 2015-10-20 – 2015-10-23 (×12): via TOPICAL
  Administered 2015-10-24: 1 via TOPICAL
  Administered 2015-10-24: 22:00:00 via TOPICAL
  Administered 2015-10-24 (×2): 1 via TOPICAL
  Administered 2015-10-25 – 2015-10-27 (×10): via TOPICAL
  Filled 2015-10-15 (×2): qty 1

## 2015-10-15 MED ORDER — ANTISEPTIC ORAL RINSE SOLUTION (CORINZ)
7.0000 mL | OROMUCOSAL | Status: DC
  Administered 2015-10-15 – 2015-10-18 (×35): 7 mL via OROMUCOSAL

## 2015-10-15 NOTE — Progress Notes (Signed)
Orthopedic Tech Progress Note Patient Details:  Cynthia [name] 09/16/1935 [id number]  Ortho Devices Type of Ortho Device: Postop shoe/boot Ortho Device/Splint Interventions: Application   [name] 10/15/2015, 8:24 AM

## 2015-10-15 NOTE — Progress Notes (Signed)
Due to spo2 dropping down in the mid 80's patients FIO2 was increased to 50%. Nurse is aware, will continue to monitor pt.

## 2015-10-15 NOTE — Progress Notes (Signed)
PULMONARY / CRITICAL CARE MEDICINE   Name: Cynthia [name] MRN: [id number] DOB: [birth year]    ADMISSION DATE:  10/06/2015 CONSULTATION DATE: 10/09/15  REFERRING MD: Triad  CHIEF COMPLAINT: Paresthesia  SUBJECTIVE:   Patient asking to be turned.  Reports back pain.  Denies SOB.  RT reports episodes of desaturations with movement, slow to recover.    VITAL SIGNS: BP 155/75 mmHg  Pulse 102  Temp(Src) 97.9 F (36.6 C) (Oral)  Resp 15  Ht 5\' 2"  (1.575 m)  Wt 145 lb 11.6 oz (66.1 kg)  BMI 26.65 kg/m2  SpO2 87%  VENTILATOR SETTINGS: Vent Mode:  [-] PRVC FiO2 (%):  [40 %] 40 % Set Rate:  [14 bmp] 14 bmp Vt Set:  [400 mL] 400 mL PEEP:  [5 cmH20] 5 cmH20 Pressure Support:  [10 cmH20] 10 cmH20 Plateau Pressure:  [13 cmH20-19 cmH20] 13 cmH20   INTAKE / OUTPUT: I/O last 3 completed shifts: In: 3633.1 [I.V.:2354.8; Other:60; NG/GT:468.3; IV Piggyback:750] Out: [id number] [Urine:1205; Stool:200]   PHYSICAL EXAMINATION: General: ill appearing female on vent Neuro: RASS 0, improved movement in upper extremities, 2-3/5 BUE  HEENT: ETT in place, mm pink/moist Cardiac: regular, no murmur Chest: even/non-labored, rhonchi bilaterally posterior Abd: soft, non tender Ext: no edema Skin: no rashes or lesions, cool lower extremities with motteling  LABS:  BMET  Recent Labs Lab 10/13/15 0415 10/13/15 2008 10/15/15 0406  NA 141 139 136  K 2.9* 3.9 3.4*  CL 104 105 105  CO2 26 26 28   BUN 14 14 21*  CREATININE 0.66 0.54 0.51  GLUCOSE 111* 118* 156*    Electrolytes  Recent Labs Lab 10/11/15 0930  10/13/15 0415 10/13/15 2008 10/14/15 0456 10/15/15 0406  CALCIUM 8.6*  < > 7.6* 8.1*  --  7.9*  MG 1.8  --  1.8  --  2.0  --   < > = values in this interval not displayed.  CBC  Recent Labs Lab 10/13/15 0415 10/13/15 1500 10/14/15 0456 10/15/15 0406  WBC 12.4*  --  13.9* 10.7*  HGB 6.7* 9.4* 9.0* 8.6*  HCT 19.7* 29.4* 27.8* 26.7*  PLT 304  --  330 286     Coag's  Recent Labs Lab 10/12/15 1200  APTT 27    Sepsis Markers  Recent Labs Lab 10/09/15 1047 10/09/15 1343 10/11/15 0930  10/13/15 0415 10/14/15 0456 10/15/15 0406  LATICACIDVEN 3.9* 1.7 1.5  --   --   --   --   PROCALCITON  --   --  0.96  < > 0.33 0.21 0.17  < > = values in this interval not displayed.  ABG  Recent Labs Lab 10/09/15 1123 10/11/15 1141  PHART 7.354 7.437  PCO2ART 33.0* 39.1  PO2ART 59.0* 393.0*    Liver Enzymes  Recent Labs Lab 10/09/15 1030 10/11/15 0930  AST 36 37  ALT 24 24  ALKPHOS 40 53  BILITOT 0.5 0.8  ALBUMIN 2.5* 2.5*    Cardiac Enzymes  Recent Labs Lab 10/11/15 0930 10/11/15 1738 10/11/15 2214  TROPONINI 0.17* 0.22* 0.20*    Glucose  Recent Labs Lab 10/14/15 1602 10/14/15 1922 10/14/15 2352 10/15/15 0359  GLUCAP 163* 106* 143* 132*    Imaging Dg Chest Port 1 View  10/15/2015  CLINICAL DATA:  Respiratory failure. EXAM: PORTABLE CHEST 1 VIEW COMPARISON:  10/14/2015. FINDINGS: Endotracheal tube, feeding tube, right IJ sheath in stable position. Stable mild cardiomegaly. No pulmonary venous congestion. Persistent left lower lobe atelectasis and consolidation. Persistent right base  subsegmental atelectasis. Small left pleural effusion. IMPRESSION: 1. Lines and tubes in stable position. 2. Persistent dense left lower lobe atelectasis and consolidation. Persistent right base subsegmental atelectasis. Persistent small left pleural effusion . Electronically Signed   By: Marcello Moores  Register   On: 10/15/2015 07:38   Dg Abd Portable 1v  10/14/2015  CLINICAL DATA:  NG tube placement EXAM: PORTABLE ABDOMEN - 1 VIEW COMPARISON:  None FINDINGS: Feeding tube is in place with the tip in the peripyloric region. Nonobstructive bowel gas pattern. IMPRESSION: Feeding tube tip in the peripyloric region. Electronically Signed   By: Rolm Baptise M.D.   On: 10/14/2015 12:39     STUDIES:  2/21 CT head >> no acute findings 2/21 MRI  brain >> chronic microvascular ischemic changes 2/23 Echo >> EF 60 to 65%, mild AR, mild MR 2/23 MRI c spine >> stenosis 2/26 MRI t/l spine >> Peripheral enhancement of the distal thoracic spinal cord is well is the nerve roots with anterior and posterior suggesting Guillain-Barre 3/01 EGD >> no active bleeding, tiny to small hiatal hernia with minimal NGT trauma  CULTURES: 2/26 Urine >> negative 2/26 Sputum >> normal flora 2/26 Blood >>  ANTIBIOTICS: 2/26 Vancomycin >> 2/26 Zosyn >>  SIGNIFICANT EVENTS: 2/21 Admit 2/22 neurology consulted 2/24 Coffee ground emesis, hypotension >> to ICU; GI consulted 2/26 Respiratory worse >> changed code status >> intubated; start IVIG 2/27 Off pressors 2/28 Transfuse 1 unit PRBC 3/02 BUE strength 2-3/5  LINES/TUBES: 2/24 Rt IJ CVL >>  2/26 ETT >>   DISCUSSION: 80 yo female presented with paresthesia in upper/lower extremities.  This was associated with gait ataxia.  She developed coffee ground emesis and hypotension >> transferred to ICU.  She has hx of CVA in May 2016, metastatic colon cancer s/p Rt colectomy and xeloda (last in November 2015) and s/p ablation of liver lesion by IR 10/06/15, and recurrent C diff.  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 Neuropathic pain. P:   Day 5/5 of IVIG RASS goal 0 to -1 Continue neurontin, lidoderm patch, fentanyl gtt PT efforts, passive ROM Q6  PULMONARY A: Acute hypoxic respiratory failure. LLL Airspace Disease - mucus plugging / infiltrate vs effusion P: Adjust vent settings, PEEP increased to 12, R 18 Daily SBT when meets criteria  F/u CXR  CARDIAC A: Hypotension 2nd to GI bleed 2/24 >> resolved. Hypertension. P Monitor hemodynamics PRN hydralazine for SBP > 160  GASTROENTEROLOGY A: Acute upper GI bleed 2/24 - s/p EGD 3/1  P:   Continue protonix bid TF per nutrition  No NGT  suction for now  RENAL A: Hypokalemia. Hypomagnesemia. P: Replace electrolytes as needed Trend BMP / UOP   HEMATOLOGY A: Anemia 2nd to GI bleed, critical illness, chronic disease. P: F/u CBC Transfuse for Hb < 7 SCD's for DVT prevention  INFECTIOUS DISEASE. A: HCAP. P: Day 5 of vancomycin, zosyn Follow abx as above  ENDOCRINE A: Hyperglycemia. P: Monitor blood sugar on BMET  Goals of Care: Was initially DNI >> code status reversed 2/26.  Plan to re-assess after 5 to 7 days on vent.     Noe Gens, NP-C [facility] Pgr: [phone] or if no answer [phone] 10/15/2015, 9:03 AM

## 2015-10-15 NOTE — Progress Notes (Signed)
Cynthia [name] 9:28 AM  Subjective: Patient in good spirits still intubated and case discussed with her daughter and we reviewed her endoscopic findings and no signs of further bleeding or other obvious GI issues  Objective: Vital signs stable afebrile no acute distress abdomen is soft nontender hemoglobin stable BUN slight increase  Assessment: Seemingly resolved upper GI bleeding  Plan: Please let us know if we could be of any further assistance during this hospital stay and wish the patient and the daughter well  [facility]  Pager [phone] After 5PM or if no answer call [phone]

## 2015-10-15 NOTE — Progress Notes (Signed)
[facility] ICU Electrolyte Replacement Protocol  Patient Name: Cynthia [name] DOB: 1936-03-22 MRN: [id number]  Date of Service  10/15/2015   HPI/Events of Note    Recent Labs Lab 10/11/15 0930 10/12/15 0347 10/12/15 2039 10/13/15 0415 10/13/15 2008 10/14/15 0456 10/15/15 0406  NA 134* 133* 138 141 139  --  136  K 3.4* 2.7* 3.3* 2.9* 3.9  --  3.4*  CL 99* 100* 101 104 105  --  105  CO2 22 25 25 26 26   --  28  GLUCOSE 136* 116* 132* 111* 118*  --  156*  BUN 15 14 15 14 14   --  21*  CREATININE 0.53 0.52 0.63 0.66 0.54  --  0.51  CALCIUM 8.6* 7.9* 8.0* 7.6* 8.1*  --  7.9*  MG 1.8  --   --  1.8  --  2.0  --     Estimated Creatinine Clearance: 51 mL/min (by C-G formula based on Cr of 0.51).  Intake/Output      03/01 0701 - 03/02 0700   I.V. (mL/kg) 1500.6 (24.2)   Other 60   NG/GT 408.3   IV Piggyback 550   Total Intake(mL/kg) 2518.9 (40.6)   Urine (mL/kg/hr) 765 (0.5)   Stool 200 (0.1)   Total Output 965   Net +1553.9       Stool Occurrence 2 x    - I/O DETAILED x24h    Total I/O In: 1497 [I.V.:827; Other:60; NG/GT:310; IV Piggyback:300] Out: 530 [Urine:330; Stool:200] - I/O THIS SHIFT    ASSESSMENT   eICURN Interventions  K+ replaced per protocol. MD notified   ASSESSMENT: MAJOR ELECTROLYTE    Lorene Dy 10/15/2015, 6:08 AM

## 2015-10-16 ENCOUNTER — Inpatient Hospital Stay (HOSPITAL_COMMUNITY): Payer: Medicare Other

## 2015-10-16 LAB — GLUCOSE, CAPILLARY
GLUCOSE-CAPILLARY: 119 mg/dL — AB (ref 65–99)
GLUCOSE-CAPILLARY: 97 mg/dL (ref 65–99)
Glucose-Capillary: 100 mg/dL — ABNORMAL HIGH (ref 65–99)
Glucose-Capillary: 105 mg/dL — ABNORMAL HIGH (ref 65–99)
Glucose-Capillary: 111 mg/dL — ABNORMAL HIGH (ref 65–99)
Glucose-Capillary: 111 mg/dL — ABNORMAL HIGH (ref 65–99)

## 2015-10-16 LAB — CBC
HEMATOCRIT: 27.6 % — AB (ref 36.0–46.0)
HEMOGLOBIN: 8.7 g/dL — AB (ref 12.0–15.0)
MCH: 28.5 pg (ref 26.0–34.0)
MCHC: 31.5 g/dL (ref 30.0–36.0)
MCV: 90.5 fL (ref 78.0–100.0)
Platelets: 273 10*3/uL (ref 150–400)
RBC: 3.05 MIL/uL — AB (ref 3.87–5.11)
RDW: 15.5 % (ref 11.5–15.5)
WBC: 9.5 10*3/uL (ref 4.0–10.5)

## 2015-10-16 LAB — BASIC METABOLIC PANEL
ANION GAP: 7 (ref 5–15)
BUN: 20 mg/dL (ref 6–20)
CHLORIDE: 101 mmol/L (ref 101–111)
CO2: 30 mmol/L (ref 22–32)
Calcium: 8 mg/dL — ABNORMAL LOW (ref 8.9–10.3)
Creatinine, Ser: 0.42 mg/dL — ABNORMAL LOW (ref 0.44–1.00)
Glucose, Bld: 145 mg/dL — ABNORMAL HIGH (ref 65–99)
Potassium: 3.7 mmol/L (ref 3.5–5.1)
Sodium: 138 mmol/L (ref 135–145)

## 2015-10-16 LAB — CULTURE, BLOOD (ROUTINE X 2)
CULTURE: NO GROWTH
Culture: NO GROWTH

## 2015-10-16 LAB — PROCALCITONIN: Procalcitonin: 0.11 ng/mL

## 2015-10-16 MED ORDER — ENOXAPARIN SODIUM 40 MG/0.4ML ~~LOC~~ SOLN
40.0000 mg | SUBCUTANEOUS | Status: DC
Start: 2015-10-16 — End: 2015-10-27
  Administered 2015-10-16 – 2015-10-27 (×12): 40 mg via SUBCUTANEOUS
  Filled 2015-10-16 (×12): qty 0.4

## 2015-10-16 NOTE — Progress Notes (Signed)
PULMONARY / CRITICAL CARE MEDICINE   Name: Cynthia [name] MRN: [id number] DOB: 1935/12/15    ADMISSION DATE:  10/06/2015 CONSULTATION DATE: 10/09/15  REFERRING MD: Triad  CHIEF COMPLAINT: Paresthesia  SUBJECTIVE:    Oxygen needs better.  Pain better controlled.  VITAL SIGNS: BP 112/62 mmHg  Pulse 88  Temp(Src) 96.5 F (35.8 C) (Axillary)  Resp 19  Ht 5\' 2"  (1.575 m)  Wt 143 lb 4.8 oz (65 kg)  BMI 26.20 kg/m2  SpO2 100%  VENTILATOR SETTINGS: Vent Mode:  [-] PRVC FiO2 (%):  [50 %-60 %] 50 % Set Rate:  [18 bmp] 18 bmp Vt Set:  [400 mL] 400 mL PEEP:  [12 cmH20] 12 cmH20 Plateau Pressure:  [21 cmH20-25 cmH20] 25 cmH20   INTAKE / OUTPUT: I/O last 3 completed shifts: In: 4009.5 [I.V.:2249.5; Other:60; NG/GT:1250; IV Piggyback:450] Out: [id number]; Stool:200]   PHYSICAL EXAMINATION: General: on vent Neuro: RASS 0, 3/5 upper extremities HEENT: ETT in place Cardiac: regular, no murmur Chest: no wheeze, better air movement at bases Abd: soft, non tender Ext: no edema Skin: no rashes or lesions  LABS:  BMET  Recent Labs Lab 10/13/15 2008 10/15/15 0406 10/16/15 0415  NA 139 136 138  K 3.9 3.4* 3.7  CL 105 105 101  CO2 26 28 30   BUN 14 21* 20  CREATININE 0.54 0.51 0.42*  GLUCOSE 118* 156* 145*    Electrolytes  Recent Labs Lab 10/11/15 0930  10/13/15 0415 10/13/15 2008 10/14/15 0456 10/15/15 0406 10/16/15 0415  CALCIUM 8.6*  < > 7.6* 8.1*  --  7.9* 8.0*  MG 1.8  --  1.8  --  2.0  --   --   < > = values in this interval not displayed.  CBC  Recent Labs Lab 10/14/15 0456 10/15/15 0406 10/16/15 0415  WBC 13.9* 10.7* 9.5  HGB 9.0* 8.6* 8.7*  HCT 27.8* 26.7* 27.6*  PLT 330 286 273    Coag's  Recent Labs Lab 10/12/15 1200  APTT 27    Sepsis Markers  Recent Labs Lab 10/09/15 1047 10/09/15 1343 10/11/15 0930  10/14/15 0456 10/15/15 0406 10/16/15 0415  LATICACIDVEN 3.9* 1.7 1.5  --   --   --   --   PROCALCITON  --   --   0.96  < > 0.21 0.17 0.11  < > = values in this interval not displayed.  ABG  Recent Labs Lab 10/09/15 1123 10/11/15 1141  PHART 7.354 7.437  PCO2ART 33.0* 39.1  PO2ART 59.0* 393.0*    Liver Enzymes  Recent Labs Lab 10/09/15 1030 10/11/15 0930  AST 36 37  ALT 24 24  ALKPHOS 40 53  BILITOT 0.5 0.8  ALBUMIN 2.5* 2.5*    Cardiac Enzymes  Recent Labs Lab 10/11/15 0930 10/11/15 1738 10/11/15 2214  TROPONINI 0.17* 0.22* 0.20*    Glucose  Recent Labs Lab 10/15/15 0837 10/15/15 1210 10/15/15 1542 10/15/15 1928 10/16/15 10/16/15 0401  GLUCAP 136* 108* 120* 115* 111* 105*    Imaging Dg Chest Port 1 View  10/16/2015  CLINICAL DATA:  Respiratory failure. EXAM: PORTABLE CHEST 1 VIEW COMPARISON:  10/15/2015. FINDINGS: Endotracheal tube, right IJ line, feeding tube in stable position. Mediastinum hilar structures normal. Low lung volumes with bibasilar atelectasis and/or infiltrates, partial improvement from prior exam. Small left pleural effusion. No pneumothorax . IMPRESSION: 1. Lines and tubes in stable position. 2. Low lung volumes with persistent but significantly improved bibasilar atelectasis and/or infiltrates. Small left pleural effusion . Electronically  Signed   ByMarcello Moores  Register   On: 10/16/2015 07:41     STUDIES:  2/21 CT head >> no acute findings 2/21 MRI brain >> chronic microvascular ischemic changes 2/23 Echo >> EF 60 to 65%, mild AR, mild MR 2/23 MRI c spine >> stenosis 2/26 MRI t/l spine >> Peripheral enhancement of the distal thoracic spinal cord is well is the nerve roots with anterior and posterior suggesting Guillain-Barre 3/01 EGD >> no active bleeding, tiny to small hiatal hernia with minimal NGT trauma  CULTURES: 2/26 Urine >> negative 2/26 Sputum >> normal flora 2/26 Blood >>  ANTIBIOTICS: 2/26 Vancomycin >> 2/26 Zosyn >>  SIGNIFICANT EVENTS: 2/21 Admit 2/22 neurology consulted 2/24 Coffee ground emesis, hypotension >> to ICU; GI  consulted 2/26 Respiratory worse >> changed code status >> intubated; start IVIG 2/27 Off pressors 2/28 Transfuse 1 unit PRBC 3/02 increased O2/PEEP needss 3/03 O2 needs improved  LINES/TUBES: 2/24 Rt IJ CVL >>  2/26 ETT >>   DISCUSSION: 80 yo female presented with paresthesia in upper/lower extremities.  This was associated with gait ataxia.  She developed coffee ground emesis and hypotension >> transferred to ICU.  She has hx of CVA in May 2016, metastatic colon cancer s/p Rt colectomy and xeloda (last in November 2015) and s/p ablation of liver lesion by IR 10/06/15, and recurrent C diff.  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 Neuropathic pain. P:   RASS goal 0 to -1 Continue neurontin, lidoderm patch, fentanyl gtt PT efforts, passive ROM Q6 Foot drop boots  PULMONARY A: Acute hypoxic respiratory failure. Basilar ATX 3/02 >> CXR better 3/03. P: Decreased FiO2 to 45%, PEEP to 5 Try pushing vent weaning over weekend F/u CXR  CARDIAC A: Hypotension 2nd to GI bleed 2/24 >> resolved. Hypertension. P Monitor hemodynamics PRN hydralazine for SBP > 160  GASTROENTEROLOGY A: Acute upper GI bleed 2/24. P:   Protonix daily TF per nutrition  No NGT suction for now  RENAL A: Hypokalemia. Hypomagnesemia. P: Replace electrolytes as needed Trend BMP / UOP   HEMATOLOGY A: Anemia 2nd to GI bleed, critical illness, chronic disease. P: F/u CBC Transfuse for Hb < 7 SCD's for DVT prevention >> add lovenox 3/03  INFECTIOUS DISEASE. A: HCAP. P: Day 6/7 of vancomycin, zosyn  ENDOCRINE A: Hyperglycemia. P: Monitor blood sugar on BMET  Goals of Care: Was initially DNI >> code status reversed 2/26.  Plan to re-assess after 5 to 7 days on vent.  If neuro function continues to improve, then she might be ready for extubation trial over  weekend.  Updated pts family at bedside.  CC time 34 minutes.  Chesley Mires, MD [facility] 10/16/2015, 9:16 AM Pager:  [phone] After 3pm call: [phone]

## 2015-10-16 NOTE — Progress Notes (Signed)
Wasted 192ml of Fentanyl down the sink with Hurshel Party RN

## 2015-10-17 ENCOUNTER — Inpatient Hospital Stay (HOSPITAL_COMMUNITY): Payer: Medicare Other

## 2015-10-17 DIAGNOSIS — R270 Ataxia, unspecified: Secondary | ICD-10-CM

## 2015-10-17 DIAGNOSIS — Z789 Other specified health status: Secondary | ICD-10-CM

## 2015-10-17 DIAGNOSIS — T17908A Unspecified foreign body in respiratory tract, part unspecified causing other injury, initial encounter: Secondary | ICD-10-CM | POA: Insufficient documentation

## 2015-10-17 DIAGNOSIS — Z978 Presence of other specified devices: Secondary | ICD-10-CM | POA: Insufficient documentation

## 2015-10-17 DIAGNOSIS — J9600 Acute respiratory failure, unspecified whether with hypoxia or hypercapnia: Secondary | ICD-10-CM

## 2015-10-17 DIAGNOSIS — T17998A Other foreign object in respiratory tract, part unspecified causing other injury, initial encounter: Secondary | ICD-10-CM

## 2015-10-17 LAB — GLUCOSE, CAPILLARY
GLUCOSE-CAPILLARY: 100 mg/dL — AB (ref 65–99)
Glucose-Capillary: 113 mg/dL — ABNORMAL HIGH (ref 65–99)
Glucose-Capillary: 102 mg/dL — ABNORMAL HIGH (ref 65–99)
Glucose-Capillary: 120 mg/dL — ABNORMAL HIGH (ref 65–99)
Glucose-Capillary: 115 mg/dL — ABNORMAL HIGH (ref 65–99)
Glucose-Capillary: 108 mg/dL — ABNORMAL HIGH (ref 65–99)

## 2015-10-17 LAB — CBC
HEMATOCRIT: 24.4 % — AB (ref 36.0–46.0)
Hemoglobin: 7.6 g/dL — ABNORMAL LOW (ref 12.0–15.0)
MCH: 28.4 pg (ref 26.0–34.0)
MCHC: 31.1 g/dL (ref 30.0–36.0)
MCV: 91 fL (ref 78.0–100.0)
PLATELETS: 217 10*3/uL (ref 150–400)
RBC: 2.68 MIL/uL — ABNORMAL LOW (ref 3.87–5.11)
RDW: 15.8 % — AB (ref 11.5–15.5)
WBC: 8.1 10*3/uL (ref 4.0–10.5)

## 2015-10-17 LAB — PROCALCITONIN: PROCALCITONIN: 0.13 ng/mL

## 2015-10-17 LAB — BASIC METABOLIC PANEL
ANION GAP: 5 (ref 5–15)
BUN: 22 mg/dL — ABNORMAL HIGH (ref 6–20)
CO2: 30 mmol/L (ref 22–32)
Calcium: 7.8 mg/dL — ABNORMAL LOW (ref 8.9–10.3)
Chloride: 103 mmol/L (ref 101–111)
Creatinine, Ser: 0.53 mg/dL (ref 0.44–1.00)
GFR calc Af Amer: >60 mL/min (ref >60)
Glucose, Bld: 113 mg/dL — ABNORMAL HIGH (ref 65–99)
POTASSIUM: 3.6 mmol/L (ref 3.5–5.1)
SODIUM: 138 mmol/L (ref 135–145)

## 2015-10-17 MED ORDER — FUROSEMIDE 10 MG/ML IJ SOLN
20.0000 mg | Freq: Two times a day (BID) | INTRAMUSCULAR | Status: AC
Start: 2015-10-17 — End: 2015-10-18
  Administered 2015-10-17 – 2015-10-18 (×4): 20 mg via INTRAVENOUS
  Filled 2015-10-17 (×4): qty 2

## 2015-10-17 MED ORDER — POTASSIUM CHLORIDE 20 MEQ/15ML (10%) PO SOLN
40.0000 meq | Freq: Once | ORAL | Status: AC
  Administered 2015-10-17: 40 meq
  Filled 2015-10-17: qty 30

## 2015-10-17 NOTE — Progress Notes (Signed)
PULMONARY / CRITICAL CARE MEDICINE   Name: MITHRA BRANDEN MRN: [id number] DOB: 01/27/1936    ADMISSION DATE:  10/06/2015 CONSULTATION DATE: 10/09/15  REFERRING MD: Triad  CHIEF COMPLAINT: Paresthesia  SUBJECTIVE:    weaning  VITAL SIGNS: BP 153/80 mmHg  Pulse 96  Temp(Src) 97.9 F (36.6 C) (Axillary)  Resp 13  Ht 5\' 2"  (1.575 m)  Wt 66.6 kg (146 lb 13.2 oz)  BMI 26.85 kg/m2  SpO2 100%  VENTILATOR SETTINGS: Vent Mode:  [-] CPAP;PSV FiO2 (%):  [40 %] 40 % Set Rate:  [18 bmp] 18 bmp Vt Set:  [400 mL] 400 mL PEEP:  [8 cmH20] 8 cmH20 Pressure Support:  [5 cmH20] 5 cmH20 Plateau Pressure:  [18 cmH20-21 cmH20] 21 cmH20   INTAKE / OUTPUT: I/O last 3 completed shifts: In: 3288.5 [I.V.:1638.5; NG/GT:1400; IV Piggyback:250] Out: 2280 [Urine:1580; Stool:700]   PHYSICAL EXAMINATION: General: on vent Neuro: RASS 0, 3/5 upper extremities - weak, bad prox muscle weakness more, fc pos HEENT: ETT in place Cardiac: s1 s2 RRR no r Chest: CTA Abd: soft, non tender Ext: no edema Skin: no rashes or lesions  LABS:  BMET  Recent Labs Lab 10/15/15 0406 10/16/15 0415 10/17/15 0430  NA 136 138 138  K 3.4* 3.7 3.6  CL 105 101 103  CO2 28 30 30   BUN 21* 20 22*  CREATININE 0.51 0.42* 0.53  GLUCOSE 156* 145* 113*    Electrolytes  Recent Labs Lab 10/11/15 0930  10/13/15 0415  10/14/15 0456 10/15/15 0406 10/16/15 0415 10/17/15 0430  CALCIUM 8.6*  < > 7.6*  < >  --  7.9* 8.0* 7.8*  MG 1.8  --  1.8  --  2.0  --   --   --   < > = values in this interval not displayed.  CBC  Recent Labs Lab 10/15/15 0406 10/16/15 0415 10/17/15 0430  WBC 10.7* 9.5 8.1  HGB 8.6* 8.7* 7.6*  HCT 26.7* 27.6* 24.4*  PLT 286 273 217    Coag's  Recent Labs Lab 10/12/15 1200  APTT 27    Sepsis Markers  Recent Labs Lab 10/11/15 0930  10/15/15 0406 10/16/15 0415 10/17/15 0430  LATICACIDVEN 1.5  --   --   --   --   PROCALCITON 0.96  < > 0.17 0.11 0.13  < > = values  in this interval not displayed.  ABG  Recent Labs Lab 10/11/15 1141  PHART 7.437  PCO2ART 39.1  PO2ART 393.0*    Liver Enzymes  Recent Labs Lab 10/11/15 0930  AST 37  ALT 24  ALKPHOS 53  BILITOT 0.8  ALBUMIN 2.5*    Cardiac Enzymes  Recent Labs Lab 10/11/15 0930 10/11/15 1738 10/11/15 2214  TROPONINI 0.17* 0.22* 0.20*    Glucose  Recent Labs Lab 10/16/15 1220 10/16/15 1653 10/16/15 1940 10/17/15 0007 10/17/15 0351 10/17/15 0750  GLUCAP 97 111* 100* 100* 113* 102*    Imaging Dg Chest Port 1 View  10/17/2015  CLINICAL DATA:  Respiratory failure EXAM: PORTABLE CHEST 1 VIEW COMPARISON:  October 16, 2015 FINDINGS: There is increased new opacity in the left retrocardiac region with shift of the heart and mediastinum to the left. The shift in the of the heart and mediastinum suggests volume loss on the left, possibly atelectasis. No pneumothorax. No other change in the cardiomediastinal silhouette. The ET tube is in good position in the mid trachea. A feeding tube terminates below today's film. A right IJ sheath remains. No other  interval changes or acute abnormalities. IMPRESSION: 1. There is new opacity in the left base obscuring the left hemidiaphragm. There is also a shift of the heart and mediastinum to the left suggesting at least a component of this opacity is due to atelectasis. Superimposed infiltrate is not excluded. Recommend attention on follow-up. 2. No other interval changes or acute abnormalities. Electronically Signed   By: Dorise Bullion III M.D   On: 10/17/2015 07:12     STUDIES:  2/21 CT head >> no acute findings 2/21 MRI brain >> chronic microvascular ischemic changes 2/23 Echo >> EF 60 to 65%, mild AR, mild MR 2/23 MRI c spine >> stenosis 2/26 MRI t/l spine >> Peripheral enhancement of the distal thoracic spinal cord is well is the nerve roots with anterior and posterior suggesting Guillain-Barre 3/01 EGD >> no active bleeding, tiny to small  hiatal hernia with minimal NGT trauma  CULTURES: 2/26 Urine >> negative 2/26 Sputum >> normal flora 2/26 Blood >>  ANTIBIOTICS: 2/26 Vancomycin >> 2/26 Zosyn >>  SIGNIFICANT EVENTS: 2/21 Admit 2/22 neurology consulted 2/24 Coffee ground emesis, hypotension >> to ICU; GI consulted 2/26 Respiratory worse >> changed code status >> intubated; start IVIG 2/27 Off pressors 2/28 Transfuse 1 unit PRBC 3/02 increased O2/PEEP needss 3/03 O2 needs improved  LINES/TUBES: 2/24 Rt IJ CVL >>  2/26 ETT >>   DISCUSSION: 80 yo female presented with paresthesia in upper/lower extremities.  This was associated with gait ataxia.  She developed coffee ground emesis and hypotension >> transferred to ICU.  She has hx of CVA in May 2016, metastatic colon cancer s/p Rt colectomy and xeloda (last in November 2015) and s/p ablation of liver lesion by IR 10/06/15, and recurrent C diff.  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 Neuropathic pain. P:   RASS goal 0, fent drip, with mandatory WUA Continue neurontin, lidoderm patch, fentanyl gtt PT efforts, passive ROM Q6 Foot drop boots Assess NIF, V, will reorder  PULMONARY A: Acute hypoxic respiratory failure - neuromusclar Basilar ATX 3/02 >> CXR better 3/03. Resolving pcxr P: Decreased FiO2 to 45%, PEEP to 5 pcxr in am , seems to be improving Waning cpap 5 ps p5, goal 2 h, currently at PS 8 , need to reduce if able Get NIF, VC q12h See neuro Even balance  CARDIAC A: Hypotension 2nd to GI bleed 2/24 >> resolved. Hypertension. P Monitor hemodynamics PRN hydralazine for SBP > 160 Add oral agents if persistently over 150  GASTROENTEROLOGY A: Acute upper GI bleed 2/24. P:   Protonix daily TF per nutrition   RENAL A: Edema on arms P: k supp small Chem in am  Lasix start kvo  HEMATOLOGY A: Anemia 2nd  to GI bleed, critical illness, chronic disease. P: F/u CBC add lovenox 3/03  INFECTIOUS DISEASE. A: HCAP. P: Zosyn to stop today, consider 7 days, today  ENDOCRINE A: Hyperglycemia. P: Monitor blood sugar on BMET  Goals of Care: Was initially DNI >> code status reversed 2/26.  Plan to re-assess after 5 to 7 days on vent.  If neuro function continues to improve, then she might be ready for extubation trial over weekend.  Updated pts family at bedside. Done df  Ccm time 30 min   [name]. Titus Mould, MD, [place] Pgr: [facility]

## 2015-10-18 ENCOUNTER — Inpatient Hospital Stay (HOSPITAL_COMMUNITY): Payer: Medicare Other

## 2015-10-18 DIAGNOSIS — G610 Guillain-Barre syndrome: Secondary | ICD-10-CM | POA: Insufficient documentation

## 2015-10-18 LAB — CBC WITH DIFFERENTIAL/PLATELET
BASOS PCT: 0 %
Basophils Absolute: 0 10*3/uL (ref 0.0–0.1)
EOS PCT: 1 %
Eosinophils Absolute: 0.1 10*3/uL (ref 0.0–0.7)
HEMATOCRIT: 27.5 % — AB (ref 36.0–46.0)
Hemoglobin: 8.6 g/dL — ABNORMAL LOW (ref 12.0–15.0)
LYMPHS PCT: 8 %
Lymphs Abs: 1 10*3/uL (ref 0.7–4.0)
MCH: 28.5 pg (ref 26.0–34.0)
MCHC: 31.3 g/dL (ref 30.0–36.0)
MCV: 91.1 fL (ref 78.0–100.0)
MONO ABS: 0.8 10*3/uL (ref 0.1–1.0)
MONOS PCT: 7 %
NEUTROS ABS: 9.8 10*3/uL — AB (ref 1.7–7.7)
Neutrophils Relative %: 84 %
PLATELETS: 229 10*3/uL (ref 150–400)
RBC: 3.02 MIL/uL — ABNORMAL LOW (ref 3.87–5.11)
RDW: 15.9 % — AB (ref 11.5–15.5)
WBC: 11.8 10*3/uL — ABNORMAL HIGH (ref 4.0–10.5)

## 2015-10-18 LAB — GLUCOSE, CAPILLARY
GLUCOSE-CAPILLARY: 124 mg/dL — AB (ref 65–99)
GLUCOSE-CAPILLARY: 107 mg/dL — AB (ref 65–99)
GLUCOSE-CAPILLARY: 101 mg/dL — AB (ref 65–99)
GLUCOSE-CAPILLARY: 108 mg/dL — AB (ref 65–99)
Glucose-Capillary: 111 mg/dL — ABNORMAL HIGH (ref 65–99)
Glucose-Capillary: 113 mg/dL — ABNORMAL HIGH (ref 65–99)

## 2015-10-18 LAB — BASIC METABOLIC PANEL
Anion gap: 8 (ref 5–15)
BUN: 24 mg/dL — AB (ref 6–20)
CALCIUM: 8.1 mg/dL — AB (ref 8.9–10.3)
CHLORIDE: 101 mmol/L (ref 101–111)
CO2: 31 mmol/L (ref 22–32)
Creatinine, Ser: 0.54 mg/dL (ref 0.44–1.00)
GFR calc Af Amer: >60 mL/min (ref >60)
GLUCOSE: 146 mg/dL — AB (ref 65–99)
POTASSIUM: 3.8 mmol/L (ref 3.5–5.1)
Sodium: 140 mmol/L (ref 135–145)

## 2015-10-18 LAB — PROCALCITONIN: Procalcitonin: 0.11 ng/mL

## 2015-10-18 MED ORDER — CETYLPYRIDINIUM CHLORIDE 0.05 % MT LIQD
7.0000 mL | Freq: Two times a day (BID) | OROMUCOSAL | Status: DC
  Administered 2015-10-19 – 2015-10-27 (×15): 7 mL via OROMUCOSAL

## 2015-10-18 MED ORDER — CHLORHEXIDINE GLUCONATE 0.12 % MT SOLN
15.0000 mL | Freq: Two times a day (BID) | OROMUCOSAL | Status: DC
  Administered 2015-10-18 – 2015-10-27 (×17): 15 mL via OROMUCOSAL
  Filled 2015-10-18 (×17): qty 15

## 2015-10-18 NOTE — Progress Notes (Signed)
PULMONARY / CRITICAL CARE MEDICINE   Name: Cynthia [name] MRN: [id number] DOB: 03-30-1936    ADMISSION DATE:  10/06/2015 CONSULTATION DATE: 10/09/15  REFERRING MD: Triad  CHIEF COMPLAINT: Paresthesia  SUBJECTIVE:    Weaning well, NIF, VC are great Neg 1.4 liters  VITAL SIGNS: BP 109/60 mmHg  Pulse 93  Temp(Src) 97.9 F (36.6 C) (Axillary)  Resp 15  Ht 5\' 2"  (1.575 m)  Wt 64.864 kg (143 lb)  BMI 26.15 kg/m2  SpO2 100%  VENTILATOR SETTINGS: Vent Mode:  [-] CPAP;PSV FiO2 (%):  [40 %] 40 % Set Rate:  [18 bmp] 18 bmp Vt Set:  [400 mL] 400 mL PEEP:  [5 cmH20] 5 cmH20 Pressure Support:  [5 cmH20] 5 cmH20 Plateau Pressure:  [15 cmH20-21 cmH20] 18 cmH20   INTAKE / OUTPUT: I/O last 3 completed shifts: In: 3054.8 [I.V.:1299.8; Other:150; NG/GT:1530; IV Piggyback:75] Out: [id number] [Urine:3285; Stool:1150]   PHYSICAL EXAMINATION: General: on vent Neuro: RASS 0, improved strength ext HEENT: ETT in place Cardiac: s1 s2 RRR no r Chest: CTA, good effort on VC and NIF Abd: soft, non tender, no r/g Ext: no edema Skin: no rashes or lesions  LABS:  BMET  Recent Labs Lab 10/16/15 0415 10/17/15 0430 10/18/15 0341  NA 138 138 140  K 3.7 3.6 3.8  CL 101 103 101  CO2 30 30 31   BUN 20 22* 24*  CREATININE 0.42* 0.53 0.54  GLUCOSE 145* 113* 146*    Electrolytes  Recent Labs Lab 10/13/15 0415  10/14/15 0456  10/16/15 0415 10/17/15 0430 10/18/15 0341  CALCIUM 7.6*  < >  --   < > 8.0* 7.8* 8.1*  MG 1.8  --  2.0  --   --   --   --   < > = values in this interval not displayed.  CBC  Recent Labs Lab 10/16/15 0415 10/17/15 0430 10/18/15 0341  WBC 9.5 8.1 11.8*  HGB 8.7* 7.6* 8.6*  HCT 27.6* 24.4* 27.5*  PLT 273 217 229    Coag's  Recent Labs Lab 10/12/15 1200  APTT 27    Sepsis Markers  Recent Labs Lab 10/16/15 0415 10/17/15 0430 10/18/15 0341  PROCALCITON 0.11 0.13 0.11    ABG  Recent Labs Lab 10/11/15 1141  PHART 7.437  PCO2ART  39.1  PO2ART 393.0*    Liver Enzymes No results for input(s): AST, ALT, ALKPHOS, BILITOT, ALBUMIN in the last 168 hours.  Cardiac Enzymes  Recent Labs Lab 10/11/15 1738 10/11/15 2214  TROPONINI 0.22* 0.20*    Glucose  Recent Labs Lab 10/17/15 1200 10/17/15 1607 10/17/15 2031 10/18/15 0029 10/18/15 0429 10/18/15 0751  GLUCAP 120* 115* 108* 113* 111* 124*    Imaging Dg Chest Port 1 View  10/18/2015  CLINICAL DATA:  Aspiration, history hypertension, colon cancer EXAM: PORTABLE CHEST 1 VIEW COMPARISON:  Portable exam 0522 hours compared to 10/17/2015 FINDINGS: Tip of endotracheal tube projects 4.6 cm above carina. Feeding tube extends into stomach. RIGHT jugular central venous catheter tip projects over confluence of the SVC. Normal heart size, mediastinal contours and pulmonary vascularity. Atherosclerotic calcification aorta. Persistent LEFT lower lobe atelectasis versus consolidation with associated small LEFT pleural effusion. Lungs emphysematous but otherwise clear. No pneumothorax. Bones demineralized. IMPRESSION: Persistent LEFT lower lobe atelectasis versus consolidation with small LEFT pleural effusion. When compared to the previous exam, little change. Electronically Signed   By: Lavonia Dana M.D.   On: 10/18/2015 07:18     STUDIES:  2/21 CT head >>  no acute findings 2/21 MRI brain >> chronic microvascular ischemic changes 2/23 Echo >> EF 60 to 65%, mild AR, mild MR 2/23 MRI c spine >> stenosis 2/26 MRI t/l spine >> Peripheral enhancement of the distal thoracic spinal cord is well is the nerve roots with anterior and posterior suggesting Guillain-Barre 3/01 EGD >> no active bleeding, tiny to small hiatal hernia with minimal NGT trauma  CULTURES: 2/26 Urine >> negative 2/26 Sputum >> normal flora 2/26 Blood >>  ANTIBIOTICS: 2/26 Vancomycin >> 2/26 Zosyn >>  SIGNIFICANT EVENTS: 2/21 Admit 2/22 neurology consulted 2/24 Coffee ground emesis, hypotension >> to  ICU; GI consulted 2/26 Respiratory worse >> changed code status >> intubated; start IVIG 2/27 Off pressors 2/28 Transfuse 1 unit PRBC 3/02 increased O2/PEEP needss 3/03 O2 needs improved  LINES/TUBES: 2/24 Rt IJ CVL >>  2/26 ETT >>   DISCUSSION: 80 yo female presented with paresthesia in upper/lower extremities.  This was associated with gait ataxia.  She developed coffee ground emesis and hypotension >> transferred to ICU.  She has hx of CVA in May 2016, metastatic colon cancer s/p Rt colectomy and xeloda (last in November 2015) and s/p ablation of liver lesion by IR 10/06/15, and recurrent C diff.  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 Neuropathic pain. P:   RASS goal 0, fent drip, with mandatory WUA Continue neurontin, lidoderm patch, fentanyl gtt - off currently Foot drop boots Assess NIF, V, excellent results  PULMONARY A: Acute hypoxic respiratory failure - neuromusclar Basilar ATX 3/02 >> CXR better 3/03. Resolving pcxr Effusion left base P: upright Waning cpap 5 ps p5, goal 30 min , assess for extubation today, mechanics support , has been treated GBS Get NIF, VC q12h - excellent results Neg balance has improved left base Will confirm no trach option with family pre extubation and if so then DNI   CARDIAC A: Hypotension 2nd to GI bleed 2/24 >> resolved. Hypertension contollled P Monitor hemodynamics PRN hydralazine for SBP > 160  GASTROENTEROLOGY A: Acute upper GI bleed 2/24. P:   Protonix daily TF per nutrition - hold for weaning If extubate, keep cortak in place  RENAL A: Edema on arms Left effusion P: Chem in am  maintain lasix, same goals kvo  HEMATOLOGY A: Anemia 2nd to GI bleed, critical illness, chronic disease. P: lovenox 3/03  INFECTIOUS DISEASE. A: HCAP. P: Received coarse  abx  ENDOCRINE A: Hyperglycemia. P: Monitor blood sugar on BMET wnl thus far  Goals of Care: Was initially DNI >> code status reversed 2/26.  Will assess wanting for reintubation / trach  Pre dc tube  Updated pts family at bedside. Done df son  Ccm time 30 min   [name]. Titus Mould, MD, [place] Pgr: [facility]

## 2015-10-18 NOTE — Progress Notes (Signed)
NIF -42, VC 1.2L with great pt effort.  Vital signs stable, sats 100% on 40% FIO2, decreased to 30% FIO2.  PT is weaning in CPAP/PS SBT trial.  RT will continue to monitor.

## 2015-10-18 NOTE — Procedures (Signed)
Extubation Procedure Note  Patient Details:   Name: Cynthia [name] DOB: [birth year] MRN: [id number]   Airway Documentation:  Airway 7.5 mm (Active)  Secured at (cm) 23 cm 10/18/2015  7:49 AM  Measured From Lips 10/18/2015  7:49 AM  [place] 10/18/2015  7:49 AM  Secured By [employer] 10/18/2015  7:49 AM  Tube Holder Repositioned Yes 10/18/2015  7:49 AM  Cuff Pressure (cm H2O) 24 cm H2O 10/18/2015  3:25 AM  Site Condition Dry 10/18/2015  7:49 AM    Evaluation  O2 sats: stable throughout Complications: No apparent complications Patient did tolerate procedure well. Bilateral Breath Sounds: Rhonchi Suctioning: Airway Yes   Pt extubated to 4L [place].  Sats are 99% and pt is tolerating well.  RT will continue to monitor.  Pierre Bali 10/18/2015, 11:28 AM

## 2015-10-18 NOTE — Progress Notes (Signed)
Interval History:                                                                                                                      Cynthia [name] is an 80 y.o. female patient with flaccid quadriparesis, areflexia diagnosis Guillain-Barr syndrome, status post 5 days of IVIG treatment with a total dose of 2 g/kg. She is still intubated , weaning well.   Her neuropathic pain is fairly well controlled at this time. She is on gabapentin, lidocaine patches and was previously on fentanyl infusion for pain which has been off.  She is alert while intubated, follows commands and nods  Yes/no consistently  to answer questions.    Past Medical History: Past Medical History  Diagnosis Date  . Anemia   . Blood transfusion without reported diagnosis jan 2015  . Heart murmur   . Hypertension     no bp meds   . Liver cyst   . C. difficile colitis 03/05/14, 03/22/14, 04/05/14    recurrent  . Stroke ([place]) 12/2014    NUMBNESS ON LEFT SIDE  . Cervical cancer ([place]) 1972  . Colon cancer ([place]) JAN 2015  . Colon cancer ([place])   . Cancer ([place])     liver  . Complication of anesthesia     slow to awaken in past, DONE WELL RECENTLY  . Tinnitus of both ears ALL THE TIME  . DVT (deep venous thrombosis) ([place]) 10/24/13    Right leg    Past Surgical History  Procedure Laterality Date  . Back surgery  1981    lower lumbar  . Tubal ligation  1968  . Laparoscopic right colectomy Right 10/22/2013    Procedure: LAPAROSCOPIC RIGHT COLECTOMY ;  Surgeon: [name], MD;  Location: [facility];  Service: General;  Laterality: Right;  . Salpingoophorectomy  10/22/2013    Procedure: [place] Saa;  Surgeon: [name]. [name], MD;  Location: [facility];  Service: Gynecology;;  . Abdominal hysterectomy      vaginal- partial  . Esophagogastroduodenoscopy N/A 10/14/2015    Procedure: ESOPHAGOGASTRODUODENOSCOPY (EGD);  Surgeon: Clarene Essex, MD;  Location: [facility];  Service: Endoscopy;  Laterality: N/A;   Bedside in ICU    Family History: Family History  Problem Relation Age of Onset  . Diabetes Sister   . Cancer Sister     leukemia  . Diabetes Brother   . Heart failure Brother   . Hypertension Brother   . Breast cancer Maternal Aunt   . Rheum arthritis Mother   . Heart failure Father   . Cancer Maternal Grandmother     GIST  . Heart failure Maternal Grandfather   . Aneurysm Paternal Grandmother     Social History:   reports that she has never smoked. She has never used smokeless tobacco. She reports that she does not drink alcohol or use illicit drugs.  Allergies:  No Known Allergies   Medications:  Current facility-administered medications:  .  [START ON 10/19/2015] antiseptic oral rinse (CPC / CETYLPYRIDINIUM CHLORIDE 0.05%) solution 7 mL, 7 mL, Mouth Rinse, q12n4p, Raylene Miyamoto, MD .  chlorhexidine (PERIDEX) 0.12 % solution 15 mL, 15 mL, Mouth Rinse, BID, Raylene Miyamoto, MD .  dextrose 5 % and 0.45 % NaCl with KCl 20 mEq/L infusion, , Intravenous, Continuous, Chesley Mires, MD, Last Rate: 30 mL/hr at 10/18/15 1800 .  enoxaparin (LOVENOX) injection 40 mg, 40 mg, Subcutaneous, Q24H, Otilio Miu, RPH, 40 mg at 10/18/15 1000 .  feeding supplement (PRO-STAT SUGAR FREE 64) liquid 30 mL, 30 mL, Per Tube, BID, Chesley Mires, MD, 30 mL at 10/18/15 1000 .  feeding supplement (VITAL AF 1.2 CAL) liquid 1,000 mL, 1,000 mL, Per Tube, Continuous, Chesley Mires, MD, Last Rate: 40 mL/hr at 10/18/15 0800, 1,000 mL at 10/18/15 0800 .  fentaNYL (SUBLIMAZE) 2,500 mcg in sodium chloride 0.9 % 250 mL (10 mcg/mL) infusion, 25-400 mcg/hr, Intravenous, Continuous, Praveen Mannam, MD, Stopped at 10/18/15 0800 .  fentaNYL (SUBLIMAZE) bolus via infusion 25 mcg, 25 mcg, Intravenous, Q1H PRN, Praveen Mannam, MD, 25 mcg at 10/15/15 2338 .  furosemide (LASIX) injection 20 mg, 20 mg,  Intravenous, Q12H, Raylene Miyamoto, MD, 20 mg at 10/18/15 1000 .  gabapentin (NEURONTIN) 250 MG/5ML solution 500 mg, 500 mg, Oral, 3 times per day, Lianni Kanaan Fuller Mandril, MD, 500 mg at 10/18/15 1528 .  Gerhardt's butt cream, , Topical, QID, Chesley Mires, MD .  hydrALAZINE (APRESOLINE) injection 10 mg, 10 mg, Intravenous, Q4H PRN, Chesley Mires, MD, 10 mg at 10/14/15 0242 .  lidocaine (LIDODERM) 5 % 2 patch, 2 patch, Transdermal, Q24H, Reiko Vinje Fuller Mandril, MD, 2 patch at 10/18/15 2025 .  pantoprazole sodium (PROTONIX) 40 mg/20 mL oral suspension 40 mg, 40 mg, Per Tube, Q24H, Chesley Mires, MD, 40 mg at 10/18/15 1000   Neurologic Examination:                                                                                                      Blood pressure 145/75, pulse 85, temperature 98.2 F (36.8 C), temperature source Oral, resp. rate 13, height 5\' 2"  (1.575 m), weight 64.864 kg (143 lb), SpO2 100 %.   intubated , alert , follows commands.  Able to elevate her bilateral upper extremities antigravity, better with the left upper extremity.  Has some movement in bilateral lower extremities but no anti-gravity strength. Continues to be areflexic.   Lab Results: Basic Metabolic Panel:  Recent Labs Lab 10/13/15 0415 10/13/15 2008 10/14/15 0456 10/15/15 0406 10/16/15 0415 10/17/15 0430 10/18/15 0341  NA 141 139  --  136 138 138 140  K 2.9* 3.9  --  3.4* 3.7 3.6 3.8  CL 104 105  --  105 101 103 101  CO2 26 26  --  28 30 30 31   GLUCOSE 111* 118*  --  156* 145* 113* 146*  BUN 14 14  --  21* 20 22* 24*  CREATININE 0.66 0.54  --  0.51 0.42* 0.53 0.54  CALCIUM  7.6* 8.1*  --  7.9* 8.0* 7.8* 8.1*  MG 1.8  --  2.0  --   --   --   --     Liver Function Tests: No results for input(s): AST, ALT, ALKPHOS, BILITOT, PROT, ALBUMIN in the last 168 hours. No results for input(s): LIPASE, AMYLASE in the last 168 hours. No results for input(s): AMMONIA in the last 168  hours.  CBC:  Recent Labs Lab 10/14/15 0456 10/15/15 0406 10/16/15 0415 10/17/15 0430 10/18/15 0341  WBC 13.9* 10.7* 9.5 8.1 11.8*  NEUTROABS  --   --   --   --  9.8*  HGB 9.0* 8.6* 8.7* 7.6* 8.6*  HCT 27.8* 26.7* 27.6* 24.4* 27.5*  MCV 88.3 88.7 90.5 91.0 91.1  PLT 330 286 273 217 229    Cardiac Enzymes:  Recent Labs Lab 10/11/15 2214  TROPONINI 0.20*    Lipid Panel: No results for input(s): CHOL, TRIG, HDL, CHOLHDL, VLDL, LDLCALC in the last 168 hours.  CBG:  Recent Labs Lab 10/18/15 0429 10/18/15 0751 10/18/15 1212 10/18/15 1641 10/18/15 1951  GLUCAP 111* 124* 107* 101* 108*    Microbiology: Results for orders placed or performed during the hospital encounter of 10/06/15  MRSA PCR Screening     Status: None   Collection Time: 10/09/15 12:58 PM  Result Value Ref Range Status   MRSA by PCR NEGATIVE NEGATIVE Final    Comment:        The GeneXpert MRSA Assay (FDA approved for NASAL specimens only), is one component of a comprehensive MRSA colonization surveillance program. It is not intended to diagnose MRSA infection nor to guide or monitor treatment for MRSA infections.   Urine culture     Status: None   Collection Time: 10/11/15  9:36 AM  Result Value Ref Range Status   Specimen Description URINE, RANDOM  Final   Special Requests NONE  Final   Culture NO GROWTH 1 DAY  Final   Report Status 10/12/2015 FINAL  Final  Culture, respiratory (NON-Expectorated)     Status: None   Collection Time: 10/11/15 10:45 AM  Result Value Ref Range Status   Specimen Description TRACHEAL ASPIRATE  Final   Special Requests NONE  Final   Gram Stain   Final    RARE WBC NO SQUAMOUS EPITHELIAL CELLS SEEN FEW GRAM POSITIVE COCCI IN CLUSTERS Performed at [facility]    Culture   Final    NORMAL OROPHARYNGEAL FLORA Performed at [facility]    Report Status 10/15/2015 FINAL  Final  Culture, blood (routine x 2)     Status: None   Collection  Time: 10/11/15 11:45 AM  Result Value Ref Range Status   Specimen Description BLOOD LEFT ANTECUBITAL  Final   Special Requests BOTTLES DRAWN AEROBIC AND ANAEROBIC  5CC  Final   Culture NO GROWTH 5 DAYS  Final   Report Status 10/16/2015 FINAL  Final  Culture, blood (routine x 2)     Status: None   Collection Time: 10/11/15 11:52 AM  Result Value Ref Range Status   Specimen Description BLOOD LEFT ARM  Final   Special Requests IN PEDIATRIC BOTTLE  [facility]  Final   Culture NO GROWTH 5 DAYS  Final   Report Status 10/16/2015 FINAL  Final    Imaging: Dg Chest Port 1 View  10/18/2015  CLINICAL DATA:  Aspiration, history hypertension, colon cancer EXAM: PORTABLE CHEST 1 VIEW COMPARISON:  Portable exam 0522 hours compared to 10/17/2015 FINDINGS:  Tip of endotracheal tube projects 4.6 cm above carina. Feeding tube extends into stomach. RIGHT jugular central venous catheter tip projects over confluence of the SVC. Normal heart size, mediastinal contours and pulmonary vascularity. Atherosclerotic calcification aorta. Persistent LEFT lower lobe atelectasis versus consolidation with associated small LEFT pleural effusion. Lungs emphysematous but otherwise clear. No pneumothorax. Bones demineralized. IMPRESSION: Persistent LEFT lower lobe atelectasis versus consolidation with small LEFT pleural effusion. When compared to the previous exam, little change. Electronically Signed   By: Lavonia Dana M.D.   On: 10/18/2015 07:18   Dg Chest Port 1 View  10/17/2015  CLINICAL DATA:  Respiratory failure EXAM: PORTABLE CHEST 1 VIEW COMPARISON:  October 16, 2015 FINDINGS: There is increased new opacity in the left retrocardiac region with shift of the heart and mediastinum to the left. The shift in the of the heart and mediastinum suggests volume loss on the left, possibly atelectasis. No pneumothorax. No other change in the cardiomediastinal silhouette. The ET tube is in good position in the mid trachea. A feeding tube terminates  below today's film. A right IJ sheath remains. No other interval changes or acute abnormalities. IMPRESSION: 1. There is new opacity in the left base obscuring the left hemidiaphragm. There is also a shift of the heart and mediastinum to the left suggesting at least a component of this opacity is due to atelectasis. Superimposed infiltrate is not excluded. Recommend attention on follow-up. 2. No other interval changes or acute abnormalities. Electronically Signed   By: Dorise Bullion III M.D   On: 10/17/2015 07:12   Dg Chest Port 1 View  10/16/2015  CLINICAL DATA:  Respiratory failure. EXAM: PORTABLE CHEST 1 VIEW COMPARISON:  10/15/2015. FINDINGS: Endotracheal tube, right IJ line, feeding tube in stable position. Mediastinum hilar structures normal. Low lung volumes with bibasilar atelectasis and/or infiltrates, partial improvement from prior exam. Small left pleural effusion. No pneumothorax . IMPRESSION: 1. Lines and tubes in stable position. 2. Low lung volumes with persistent but significantly improved bibasilar atelectasis and/or infiltrates. Small left pleural effusion . Electronically Signed   By: Marcello Moores  Register   On: 10/16/2015 07:41   Dg Chest Port 1 View  10/15/2015  CLINICAL DATA:  Respiratory failure. EXAM: PORTABLE CHEST 1 VIEW COMPARISON:  10/14/2015. FINDINGS: Endotracheal tube, feeding tube, right IJ sheath in stable position. Stable mild cardiomegaly. No pulmonary venous congestion. Persistent left lower lobe atelectasis and consolidation. Persistent right base subsegmental atelectasis. Small left pleural effusion. IMPRESSION: 1. Lines and tubes in stable position. 2. Persistent dense left lower lobe atelectasis and consolidation. Persistent right base subsegmental atelectasis. Persistent small left pleural effusion . Electronically Signed   By: Marcello Moores  Register   On: 10/15/2015 07:38    Assessment and plan:   Cynthia [name] is an 80 y.o. female patient with flaccid quadriparesis,  areflexia diagnosis Guillain-Barr syndrome, status post 5 days of IVIG treatment with a total dose of 2 g/kg. She is still intubated , weaning well.  Her neuropathic pain is fairly well controlled at this time. She is on gabapentin, lidocaine patches and was previously on fentanyl infusion for pain which has been off.  She is alert while intubated, follows commands and nods  Yes/no consistently  to answer questions. Able to elevate her bilateral upper extremities antigravity, better with the left upper extremity.  Has some movement in bilateral lower extremities but no anti-gravity strength. Continues to be areflexic.   expected to be extubated soon,  As she has been weaning well with  the vent settings.  Continue physical and occupational therapy.  Her painful paresthesias appear to be well controlled with gabapentin and lidocaine patches.   no further neurological recommendations at this time.  We'll follow-up as needed. Please call for any questions.

## 2015-10-19 ENCOUNTER — Inpatient Hospital Stay (HOSPITAL_COMMUNITY): Payer: Medicare Other

## 2015-10-19 DIAGNOSIS — R4182 Altered mental status, unspecified: Secondary | ICD-10-CM

## 2015-10-19 LAB — CBC WITH DIFFERENTIAL/PLATELET
BASOS ABS: 0 10*3/uL (ref 0.0–0.1)
BASOS PCT: 0 %
EOS PCT: 1 %
Eosinophils Absolute: 0.1 10*3/uL (ref 0.0–0.7)
HCT: 26.2 % — ABNORMAL LOW (ref 36.0–46.0)
Hemoglobin: 8.1 g/dL — ABNORMAL LOW (ref 12.0–15.0)
Lymphocytes Relative: 9 %
Lymphs Abs: 0.9 10*3/uL (ref 0.7–4.0)
MCH: 28 pg (ref 26.0–34.0)
MCHC: 30.9 g/dL (ref 30.0–36.0)
MCV: 90.7 fL (ref 78.0–100.0)
MONO ABS: 0.5 10*3/uL (ref 0.1–1.0)
MONOS PCT: 5 %
Neutro Abs: 8.7 10*3/uL — ABNORMAL HIGH (ref 1.7–7.7)
Neutrophils Relative %: 85 %
PLATELETS: 208 10*3/uL (ref 150–400)
RBC: 2.89 MIL/uL — ABNORMAL LOW (ref 3.87–5.11)
RDW: 15.8 % — AB (ref 11.5–15.5)
WBC: 10.2 10*3/uL (ref 4.0–10.5)

## 2015-10-19 LAB — BASIC METABOLIC PANEL
ANION GAP: 5 (ref 5–15)
BUN: 19 mg/dL (ref 6–20)
CALCIUM: 8.1 mg/dL — AB (ref 8.9–10.3)
CO2: 32 mmol/L (ref 22–32)
CREATININE: 0.54 mg/dL (ref 0.44–1.00)
Chloride: 104 mmol/L (ref 101–111)
GFR calc Af Amer: >60 mL/min (ref >60)
GLUCOSE: 114 mg/dL — AB (ref 65–99)
POTASSIUM: 4 mmol/L (ref 3.5–5.1)
SODIUM: 141 mmol/L (ref 135–145)

## 2015-10-19 LAB — GLUCOSE, CAPILLARY
GLUCOSE-CAPILLARY: 105 mg/dL — AB (ref 65–99)
GLUCOSE-CAPILLARY: 94 mg/dL (ref 65–99)
GLUCOSE-CAPILLARY: 97 mg/dL (ref 65–99)
Glucose-Capillary: 92 mg/dL (ref 65–99)
Glucose-Capillary: 102 mg/dL — ABNORMAL HIGH (ref 65–99)
Glucose-Capillary: 117 mg/dL — ABNORMAL HIGH (ref 65–99)

## 2015-10-19 LAB — PHOSPHORUS: Phosphorus: 3.5 mg/dL (ref 2.5–4.6)

## 2015-10-19 LAB — MAGNESIUM: MAGNESIUM: 1.7 mg/dL (ref 1.7–2.4)

## 2015-10-19 LAB — PROCALCITONIN

## 2015-10-19 MED ORDER — MAGNESIUM SULFATE 50 % IJ SOLN
2.0000 g | Freq: Once | INTRAMUSCULAR | Status: AC
  Administered 2015-10-19: 2 g via INTRAVENOUS
  Filled 2015-10-19: qty 4

## 2015-10-19 MED ORDER — FENTANYL CITRATE (PF) 100 MCG/2ML IJ SOLN
25.0000 ug | INTRAMUSCULAR | Status: DC | PRN
  Administered 2015-10-19 (×2): 25 ug via INTRAVENOUS
  Filled 2015-10-19 (×2): qty 2

## 2015-10-19 NOTE — Progress Notes (Signed)
RT note- NIF-38 FVC-1.5L

## 2015-10-19 NOTE — Evaluation (Signed)
Physical Therapy Re-Evaluation Patient Details Name: Cynthia [name] MRN: [id number] DOB: [birth year] Today's Date: 10/19/2015   History of Present Illness  Cynthia [name] is a 80 y.o. female with metastatic colon cancer, status post right colectomy and chemotherapy 2015. In December patient was found to have liver lesions, biopsy confirmed metastatic colon cancer. She underwent ablation of the liver lesions on the tenth of last month.  Was found to have GBS and was intubated 2/26-10/18/15.  Clinical Impression  Patient presents with worsening weakness and poor balance and other issues listed in PT problem list.  She will benefit from skilled PT in the acute setting to allow return home with family support following CIR level rehab stay.    Follow Up Recommendations CIR    Equipment Recommendations  Wheelchair (measurements PT);Wheelchair cushion (measurements PT)    Recommendations for Other Services Rehab consult;OT consult     Precautions / Restrictions Precautions Precautions: Fall      Mobility  Bed Mobility Overal bed mobility: Needs Assistance   Rolling: Mod assist;+2 for physical assistance Sidelying to sit: Total assist;+2 for physical assistance   Sit to supine: +2 for physical assistance;Total assist   General bed mobility comments: assist to flex knee and reach for rail and bring legs off bed and lift trunk  Transfers                 General transfer comment: attempted with lift pad to use sky lift for OOB to chair, but sky lift broken and nurses on unit reported no maximove available, so removed lift pad and   Ambulation/Gait                Stairs            Wheelchair Mobility    Modified Rankin (Stroke Patients Only)       Balance Overall balance assessment: Needs assistance Sitting-balance support: Bilateral upper extremity supported;Feet unsupported Sitting balance-Leahy Scale: Zero Sitting balance - Comments: needed +2 A  for EOB                                     Pertinent Vitals/Pain Faces Pain Scale: Hurts whole lot Pain Location: feet and legs with movement Pain Descriptors / Indicators: Aching;Numbness;Tingling Pain Intervention(s): Monitored during session;Repositioned;Limited activity within patient's tolerance    Home Living Family/patient expects to be discharged to:: Inpatient rehab Living Arrangements: Children Available Help at Discharge: Family;Available 24 hours/day Type of Home: House Home Access: Stairs to enter Entrance Stairs-Rails: None Entrance Stairs-Number of Steps: 1 Home Layout: One level Home Equipment: Walker - 2 wheels;Walker - standard;Cane - quad;Crutches;Bedside commode;Shower seat;Grab bars - tub/shower      Prior Function Level of Independence: Independent               Hand Dominance        Extremity/Trunk Assessment     RUE Deficits / Details: AAROM WFL, strength shoulder flexion 2+/5, elbow flexion 3/5, elbow extension 2/5; numbness and tingling fingers to shoulders; poor control with movements, i.e. hit herself when flexing elbows     LUE Deficits / Details: AAROM WFL, strength shoulder flexion 2+/5, elbow flexion 3/5, elbow extension 2/5; numbness and tingling fingers to shoulders; poor control with movements, i.e. hit herself when flexing elbows     RLE Deficits / Details: AAROM WFL, strength hip flexion 2-/5, knee extension 1+/5, ankle DF 0/5, numbness and  tingling feet to knees LLE Deficits / Details: AAROM WFL, strength hip flexion 2-/5, knee extension 1+/5, ankle DF 0/5, numbness and tingling feet to knees     Communication   Communication: No difficulties  Cognition Arousal/Alertness: Awake/alert Behavior During Therapy: WFL for tasks assessed/performed Overall Cognitive Status: Within Functional Limits for tasks assessed                      General Comments General comments (skin integrity, edema, etc.): has  rectal pouch, foley, mepilex on heels and PRAFO    Exercises General Exercises - Lower Extremity Ankle Circles/Pumps: PROM;Both;10 reps;Supine Heel Slides: AAROM;Both;5 reps;Supine      Assessment/Plan    PT Assessment Patient needs continued PT services  PT Diagnosis Generalized weakness;Other (comment) (quadriparesis)   PT Problem List Decreased strength;Decreased activity tolerance;Decreased balance;Pain;Decreased mobility;Decreased coordination;Impaired sensation  PT Treatment Interventions DME instruction;Balance training;Neuromuscular re-education;Functional mobility training;Patient/family education;Therapeutic activities;Therapeutic exercise;Wheelchair mobility training   PT Goals (Current goals can be found in the Care Plan section) Acute Rehab PT Goals Patient Stated Goal: return to being independent PT Goal Formulation: With patient Time For Goal Achievement: 11/02/15 Potential to Achieve Goals: Fair    Frequency Min 3X/week   Barriers to discharge Decreased caregiver support      Co-evaluation               End of Session Equipment Utilized During Treatment: Oxygen Activity Tolerance: Patient limited by pain;Patient limited by fatigue Patient left: with call bell/phone within reach;in bed           Time: 1337-1400 PT Time Calculation (min) (ACUTE ONLY): 23 min   Charges:   PT Evaluation $PT Re-evaluation: 1 Procedure PT Treatments $Therapeutic Activity: 8-22 mins   PT G Codes:        Reginia Naas 11-16-15, 4:55 PM Magda Kiel, [place] 11/16/15

## 2015-10-19 NOTE — Progress Notes (Addendum)
Wasted 192ml of Fentanyl down the sink with Lissa Morales.

## 2015-10-19 NOTE — Progress Notes (Addendum)
Nutrition Follow Up  DOCUMENTATION CODES:   Not applicable  INTERVENTION:    If TF re-initiation warranted, recommend resuming Vital AF 1.2 formula at 25 ml/hr and increase by 10 ml every 4 hours to goal rate of 55 ml/hr  Above TF regimen to provide 1584 kcals, 99 gm protein, 1071 ml of free water  NUTRITION DIAGNOSIS:   Inadequate oral intake related to inability to eat as evidenced by NPO status, ongoing  GOAL:   Patient will meet greater than or equal to 90% of their needs, currently unmet  MONITOR:   Diet advancement, TF tolerance, Labs, Weight trends, I & O's  ASSESSMENT:   80 yo Female with metastatic colon cancer, and with neurologic issues of numbness tingling increased weakness. Stay complicated by GI bleed. Transfer to the ICU for hypotension.   Patient s/p procedure 3/1: EGD, DIAGNOSTIC -- impression -- no active bleeding  Patient extubated 3/5. TF (Vital AF 1.2 formula) stopped.  CORTRAK tube (tip in peripyloric region) remains in place. MRI showed [place].  IVIG completed for 5 days.  Neurology note 3/6 reviewed.  Patient slowly improving. S/p bedside swallow evaluation this AM.  SLP recommending FEES.  Diet Order:  Diet NPO time specified  Skin:  Reviewed, no issues  Last BM:  N/A  Height:   Ht Readings from Last 1 Encounters:  10/06/15 5\' 2"  (1.575 m)    Weight:   Wt Readings from Last 1 Encounters:  10/18/15 143 lb (64.864 kg)    Ideal Body Weight:  50 kg  BMI:  Body mass index is 26.15 kg/(m^2).  Estimated Nutritional Needs:   Kcal:  1500-1700  Protein:  95-105 gm  Fluid:  >/= 1.5 L  EDUCATION NEEDS:   No education needs identified at this time  Arthur Holms, RD, LDN Pager #: [phone] After-Hours Pager #: [phone]

## 2015-10-19 NOTE — Progress Notes (Signed)
Rehab Admissions Coordinator Note:  Patient was screened by Cleatrice Burke for appropriateness for an Inpatient Acute Rehab Consult per PT recommendation.  At this time, we are recommending Inpatient Rehab consult. Please place order.  Cleatrice Burke 10/19/2015, 5:02 PM  I can be reached at [phone].

## 2015-10-19 NOTE — Progress Notes (Addendum)
PULMONARY / CRITICAL CARE MEDICINE   Name: Cynthia [name] MRN: [id number] DOB: 1935/09/06    ADMISSION DATE:  10/06/2015 CONSULTATION DATE: 10/09/15  REFERRING MD: [facility]  CHIEF COMPLAINT: Paresthesia  SUBJECTIVE:    Extubated well, no sig secretions, coughing well  VITAL SIGNS: BP 129/68 mmHg  Pulse 66  Temp(Src) 98 F (36.7 C) (Oral)  Resp 17  Ht 5\' 2"  (1.575 m)  Wt 64.864 kg (143 lb)  BMI 26.15 kg/m2  SpO2 98%  VENTILATOR SETTINGS:     INTAKE / OUTPUT: I/O last 3 completed shifts: In: 2019.5 [I.V.:1149.5; NG/GT:870] Out: [id number] [Urine:3695; Stool:650]   PHYSICAL EXAMINATION: General: on vent Neuro: improved strength, cough well, lifting arms well HEENT:jvd wnl Cardiac: s1 s2 RRR no r Chest: CTA, good effort Abd: soft, non tender, no r/g Ext: no edema Skin: no rashes or lesions  LABS:  BMET  Recent Labs Lab 10/17/15 0430 10/18/15 0341 10/19/15 0446  NA 138 140 141  K 3.6 3.8 4.0  CL 103 101 104  CO2 30 31 32  BUN 22* 24* 19  CREATININE 0.53 0.54 0.54  GLUCOSE 113* 146* 114*    Electrolytes  Recent Labs Lab 10/13/15 0415  10/14/15 0456  10/17/15 0430 10/18/15 0341 10/19/15 0446  CALCIUM 7.6*  < >  --   < > 7.8* 8.1* 8.1*  MG 1.8  --  2.0  --   --   --  1.7  PHOS  --   --   --   --   --   --  3.5  < > = values in this interval not displayed.  CBC  Recent Labs Lab 10/17/15 0430 10/18/15 0341 10/19/15 0446  WBC 8.1 11.8* 10.2  HGB 7.6* 8.6* 8.1*  HCT 24.4* 27.5* 26.2*  PLT 217 229 208    Coag's  Recent Labs Lab 10/12/15 1200  APTT 27    Sepsis Markers  Recent Labs Lab 10/17/15 0430 10/18/15 0341 10/19/15 0446  PROCALCITON 0.13 0.11 <0.10    ABG No results for input(s): PHART, PCO2ART, PO2ART in the last 168 hours.  Liver Enzymes No results for input(s): AST, ALT, ALKPHOS, BILITOT, ALBUMIN in the last 168 hours.  Cardiac Enzymes No results for input(s): TROPONINI, PROBNP in the last 168  hours.  Glucose  Recent Labs Lab 10/18/15 1212 10/18/15 1641 10/18/15 1951 10/19/15 0023 10/19/15 0347 10/19/15 0831  GLUCAP 107* 101* 108* 105* 92 97    Imaging Dg Chest Port 1 View  10/19/2015  CLINICAL DATA:  Respiratory failure. EXAM: PORTABLE CHEST 1 VIEW COMPARISON:  10/18/2015. FINDINGS: Interim extubation. Right IJ sheath and feeding tube in stable position. Low lung volumes. Interim partial clearing of left base atelectasis and/or infiltrate. Mild right base and right mid lung field subsegmental atelectasis. Persistent small left pleural effusion. Small left pleural effusion. No pneumothorax. IMPRESSION: 1. Interim extubation. Right IJ sheath and feeding tube in stable position. 2. Low lung volumes. Interim partial clearing of left base atelectasis and/or infiltrate. Mild right base and right mid lung field subsegmental atelectasis. Persistent small left pleural effusion. Continued follow-up exam suggested to demonstrate clearing to exclude underlying focal lesion. Electronically Signed   By: Marcello Moores  Register   On: 10/19/2015 07:19    STUDIES:  2/21 CT head >> no acute findings 2/21 MRI brain >> chronic microvascular ischemic changes 2/23 Echo >> EF 60 to 65%, mild AR, mild MR 2/23 MRI c spine >> stenosis 2/26 MRI t/l spine >> Peripheral enhancement of the  distal thoracic spinal cord is well is the nerve roots with anterior and posterior suggesting Guillain-Barre 3/01 EGD >> no active bleeding, tiny to small hiatal hernia with minimal NGT trauma 3/6 fees>>>  CULTURES: 2/26 Urine >> negative 2/26 Sputum >> normal flora 2/26 Blood >>  ANTIBIOTICS: 2/26 Vancomycin >> 2/26 Zosyn >>  SIGNIFICANT EVENTS: 2/21 Admit 2/22 neurology consulted 2/24 Coffee ground emesis, hypotension >> to ICU; GI consulted 2/26 Respiratory worse >> changed code status >> intubated; start IVIG 2/27 Off pressors 2/28 Transfuse 1 unit PRBC 3/02 increased O2/PEEP needss 3/03 O2 needs  improved 3/6 neg 2 liters   LINES/TUBES: 2/24 Rt IJ CVL >>3/6  2/26 ETT >> 3/6  DISCUSSION: 80 yo female presented with paresthesia in upper/lower extremities.  This was associated with gait ataxia.  She developed coffee ground emesis and hypotension >> transferred to ICU.  She has hx of CVA in May 2016, metastatic colon cancer s/p Rt colectomy and xeloda (last in November 2015) and s/p ablation of liver lesion by IR 10/06/15, and recurrent C diff.  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 Neuropathic pain. P:   Continue neurontin, lidoderm patch Maintain NIF, VC PT active  PULMONARY A: Acute hypoxic respiratory failure - neuromusclar Basilar ATX 3/02 >> CXR better 3/03. Resolving pcxr Effusion left base P: upright PT Upright as able IS  CARDIAC A: Hypotension 2nd to GI bleed 2/24 >> resolved. Hypertension contollled P Monitor hemodynamics PRN hydralazine for SBP > 160, not needed  GASTROENTEROLOGY A: Acute upper GI bleed 2/24. High risk dysphagia P:   Protonix daily TF per nutrition - hold for fees first  RENAL A: Edema on arms Left effusion Neg balance tolerated well P: Chem in am  maintain lasix, same goals kvo Mag supp  HEMATOLOGY A: Anemia 2nd to GI bleed, critical illness, chronic disease. P: lovenox 3/03 Diluted anemia, lasix  INFECTIOUS DISEASE. A: HCAP. P: Received coarse abx  ENDOCRINE A: Hyperglycemia. P: cbg in am   Full code, if re ett then trach To [facility], sdu Dc line Dc foley in 24 hr, lasix maintain, poor to turn   [employer]. Titus Mould, MD, [place] Pgr: [facility]

## 2015-10-19 NOTE — Procedures (Signed)
Objective Swallowing Evaluation: Type of Study: FEES-Fiberoptic Endoscopic Evaluation of Swallow  Patient Details  Name: Cynthia [name] MRN: [id number] Date of Birth: [birth year]  Today's Date: 10/19/2015 Time: SLP Start Time (ACUTE ONLY): 1520-SLP Stop Time (ACUTE ONLY): 1550 SLP Time Calculation (min) (ACUTE ONLY): 30 min  Past Medical History:  Past Medical History  Diagnosis Date  . Anemia   . Blood transfusion without reported diagnosis jan 2015  . Heart murmur   . Hypertension     no bp meds   . Liver cyst   . C. difficile colitis 03/05/14, 03/22/14, 04/05/14    recurrent  . Stroke ([place]) 12/2014    NUMBNESS ON LEFT SIDE  . Cervical cancer ([place]) 1972  . Colon cancer ([place]) JAN 2015  . Colon cancer ([place])   . Cancer ([place])     liver  . Complication of anesthesia     slow to awaken in past, DONE WELL RECENTLY  . Tinnitus of both ears ALL THE TIME  . DVT (deep venous thrombosis) ([employer]) 10/24/13    Right leg   Past Surgical History:  Past Surgical History  Procedure Laterality Date  . Back surgery  1981    lower lumbar  . Tubal ligation  1968  . Laparoscopic right colectomy Right 10/22/2013    Procedure: LAPAROSCOPIC RIGHT COLECTOMY ;  Surgeon: [name], MD;  Location: [facility];  Service: General;  Laterality: Right;  . Salpingoophorectomy  10/22/2013    Procedure: Marquette Saa;  Surgeon: [name]. [name], MD;  Location: [facility]S;  Service: Gynecology;;  . Abdominal hysterectomy      vaginal- partial  . Esophagogastroduodenoscopy N/A 10/14/2015    Procedure: ESOPHAGOGASTRODUODENOSCOPY (EGD);  Surgeon: Clarene Essex, MD;  Location: [facility] ENDOSCOPY;  Service: Endoscopy;  Laterality: N/A;  Bedside in ICU   HPI: RETTA SALIBA is an 80 y.o. female patient presented with paresthesia in UE/LE. 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 Pt developed coffee ground  emesis and hypotension - acute UGI bleed on 2/24.  Pt was intubated from 10/11/15 until 10/18/15. She has hx of finding of chronic right  basal ganglia CVA in May 2016 (BSE done, pt normal), metastatic colon cancer s/p Rt colectomy.   No Data Recorded  Assessment / Plan / Recommendation  CHL IP CLINICAL IMPRESSIONS 10/19/2015  Therapy Diagnosis --  Clinical Impression Pt presents with diffuse weakness across the hyolaryngeal musculature particularly impacting pharyngeal constriction, epigltotic deflection and laryngeal closure. WIth purees and nectar thick liquids the pt silently penetrates trace amounts to the cords during the swallow and there is diffuse residual throughout oropharynx post swallow. Suspect mild neuromuscular weakness impacts strength of musculature, which combined with sensory deficits and weak cough, put pt at high risk of aspiration. Expect improvement in function as treatment continues. Pt may benefit from RMT and strengthening exercises with reassessment in 2-5 days.   Impact on safety and function Severe aspiration risk      CHL IP TREATMENT RECOMMENDATION 10/19/2015  Treatment Recommendations Therapy as outlined in treatment plan below     No flowsheet data found.  CHL IP DIET RECOMMENDATION 10/19/2015  SLP Diet Recommendations NPO;Alternative means - temporary  Liquid Administration via --  Medication Administration --  Compensations --  Postural Changes --      No flowsheet data found.    CHL IP FOLLOW UP RECOMMENDATIONS 10/19/2015  Follow up Recommendations  Inpatient Rehab      No flowsheet data found.         CHL IP ORAL PHASE 10/19/2015  Oral Phase WFL  Oral - Pudding Teaspoon --  Oral - Pudding Cup --  Oral - Honey Teaspoon --  Oral - Honey Cup --  Oral - Nectar Teaspoon --  Oral - Nectar Cup --  Oral - Nectar Straw --  Oral - Thin Teaspoon --  Oral - Thin Cup --  Oral - Thin Straw --  Oral - Puree --  Oral - Mech Soft --  Oral - Regular --  Oral -  Multi-Consistency --  Oral - Pill --  Oral Phase - Comment --    CHL IP PHARYNGEAL PHASE 10/19/2015  Pharyngeal Phase Impaired  Pharyngeal- Pudding Teaspoon --  Pharyngeal --  Pharyngeal- Pudding Cup --  Pharyngeal --  Pharyngeal- Honey Teaspoon --  Pharyngeal --  Pharyngeal- Honey Cup --  Pharyngeal --  Pharyngeal- Nectar Teaspoon --  Pharyngeal --  Pharyngeal- Nectar Cup Delayed swallow initiation-vallecula;Reduced epiglottic inversion;Reduced airway/laryngeal closure;Reduced pharyngeal peristalsis;Penetration/Aspiration during swallow;Pharyngeal residue - valleculae;Inter-arytenoid space residue;Lateral channel residue;Pharyngeal residue - posterior pharnyx  Pharyngeal Material enters airway, CONTACTS cords and not ejected out  Pharyngeal- Nectar Straw --  Pharyngeal --  Pharyngeal- Thin Teaspoon --  Pharyngeal --  Pharyngeal- Thin Cup --  Pharyngeal --  Pharyngeal- Thin Straw --  Pharyngeal --  Pharyngeal- Puree Delayed swallow initiation-vallecula;Reduced epiglottic inversion;Reduced airway/laryngeal closure;Reduced pharyngeal peristalsis;Penetration/Aspiration during swallow;Pharyngeal residue - valleculae;Inter-arytenoid space residue;Lateral channel residue;Pharyngeal residue - posterior pharnyx  Pharyngeal Material enters airway, CONTACTS cords and not ejected out  Pharyngeal- Mechanical Soft --  Pharyngeal --  Pharyngeal- Regular --  Pharyngeal --  Pharyngeal- Multi-consistency --  Pharyngeal --  Pharyngeal- Pill --  Pharyngeal --  Pharyngeal Comment --     No flowsheet data found.  No flowsheet data found.  Chaz Ronning, Katherene Ponto 10/19/2015, 4:02 PM

## 2015-10-19 NOTE — Evaluation (Signed)
Clinical/Bedside Swallow Evaluation Patient Details  Name: Cynthia [name] MRN: [id number] Date of Birth: [birth year]  Today's Date: 10/19/2015 Time: SLP Start Time (ACUTE ONLY): 15 SLP Stop Time (ACUTE ONLY): 0938 SLP Time Calculation (min) (ACUTE ONLY): 23 min  Past Medical History:  Past Medical History  Diagnosis Date  . Anemia   . Blood transfusion without reported diagnosis jan 2015  . Heart murmur   . Hypertension     no bp meds   . Liver cyst   . C. difficile colitis 03/05/14, 03/22/14, 04/05/14    recurrent  . Stroke ([place]) 12/2014    NUMBNESS ON LEFT SIDE  . Cervical cancer ([place]) 1972  . Colon cancer ([place]) JAN 2015  . Colon cancer ([place])   . Cancer ([place])     liver  . Complication of anesthesia     slow to awaken in past, DONE WELL RECENTLY  . Tinnitus of both ears ALL THE TIME  . DVT (deep venous thrombosis) ([place]) 10/24/13    Right leg   Past Surgical History:  Past Surgical History  Procedure Laterality Date  . Back surgery  1981    lower lumbar  . Tubal ligation  1968  . Laparoscopic right colectomy Right 10/22/2013    Procedure: LAPAROSCOPIC RIGHT COLECTOMY ;  Surgeon: [name], MD;  Location: [facility];  Service: General;  Laterality: Right;  . Salpingoophorectomy  10/22/2013    Procedure: Marquette Saa;  Surgeon: [name]. [name], MD;  Location: [facility];  Service: Gynecology;;  . Abdominal hysterectomy      vaginal- partial  . Esophagogastroduodenoscopy N/A 10/14/2015    Procedure: ESOPHAGOGASTRODUODENOSCOPY (EGD);  Surgeon: Clarene Essex, MD;  Location: [facility];  Service: Endoscopy;  Laterality: N/A;  Bedside in ICU   HPI:  Cynthia [name] is an 80 y.o. female patient presented with paresthesia in UE/LE. 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 Pt developed coffee ground emesis and hypotension - acute UGI bleed on 2/24.  Pt was  intubated from 10/11/15 until 10/18/15. She has hx of finding of chronic right  basal ganglia CVA in May 2016 (BSE done, pt normal), metastatic colon cancer s/p Rt colectomy.    Assessment / Plan / Recommendation Clinical Impression  Pt demonstrates no signs of neuromuscular impact on oral or oropharyngeal function; pt takes bites and sips with adequate ROM, labial seal, rapid oral transit and timely swallow. However, dysphonia following intubation concerning for decreased airway protection with increased risk due to weak cough with accessory muscle use. Pt initially with no signs of aspiration, but delayed cough after trials complete concerning for silent aspiration. Recommend FEES for objective assessment of swallow prior to PO diet. Pt in agreement.     Aspiration Risk  Severe aspiration risk    Diet Recommendation NPO;Ice chips PRN after oral care   Medication Administration: Via alternative means    Other  Recommendations Oral Care Recommendations: Oral care QID   Follow up Recommendations  Inpatient Rehab    Frequency and Duration            Prognosis        Swallow Study   General HPI: Cynthia [name] is an 79 y.o. female patient presented with paresthesia in UE/LE. 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 Pt developed coffee  ground emesis and hypotension - acute UGI bleed on 2/24.  Pt was intubated from 10/11/15 until 10/18/15. She has hx of finding of chronic right  basal ganglia CVA in May 2016 (BSE done, pt normal), metastatic colon cancer s/p Rt colectomy.  Type of Study: Bedside Swallow Evaluation Previous Swallow Assessment: see HPI Diet Prior to this Study: NG Tube;NPO Temperature Spikes Noted: No Respiratory Status: Room air History of Recent Intubation: Yes Length of Intubations (days): 8 days Date extubated: 10/18/15 Behavior/Cognition:  Alert;Cooperative;Pleasant mood Oral Cavity Assessment: Dry Oral Care Completed by SLP: No Oral Cavity - Dentition: Adequate natural dentition Vision: Functional for self-feeding Self-Feeding Abilities: Total assist Patient Positioning: Upright in bed Baseline Vocal Quality: Hoarse Volitional Cough: Weak Volitional Swallow: Able to elicit    Oral/Motor/Sensory Function Overall Oral Motor/Sensory Function: Within functional limits   Ice Chips     Thin Liquid Thin Liquid: Impaired Pharyngeal  Phase Impairments: Cough - Delayed    Nectar Thick Nectar Thick Liquid: Not tested   Honey Thick Honey Thick Liquid: Not tested   Puree Puree: Within functional limits   Solid   GO   Solid: Not tested       Herbie Baltimore, MA CCC-SLP [phone]  Lynann Beaver 10/19/2015,9:48 AM

## 2015-10-19 NOTE — Progress Notes (Signed)
Subjective: Patient was successfully extubated yesterday and has continued to breathe adequately on her own. No adverse events occurred overnight. She's complaining of mild to moderate pain involving distal upper extremities.  Objective: Current vital signs: BP 129/68 mmHg  Pulse 66  Temp(Src) 98 F (36.7 C) (Oral)  Resp 17  Ht 5\' 2"  (1.575 m)  Wt 64.864 kg (143 lb)  BMI 26.15 kg/m2  SpO2 98%  Neurologic Exam: Patient was alert and in no acute distress. Mental status was normal. She was able to verbalize with low volume speech. Extraocular was were full and conjugate. No facial weakness was noted. She was able to lift both upper extremities against gravity but had moderately severe weakness proximally and distally. Lower extremities with much weaker with slight voluntary movement of right lower extremity distracted and not involving left lower extremity. She also had numbness of her left lower extremity more so than the right.  Medications: I have reviewed the patient's current medications.  Assessment/Plan: 80 year old lady with GBS, slowly improving following a course of IVIG for 5 days. She is extubated at this point and breathing well on on her own. Strength of upper extremities is slowly improving. Patient will undoubtedly need extensive rehabilitation intervention.  Physical occupational therapy consults have been ordered, as well as speech therapy to assess patient's swallowing. Based on their recommendations we will also consult inpatient rehabilitation.  No changes in current management recommended. We will continue to follow this patient with you.  C.R. Nicole Kindred, MD [facility] [phone]  10/19/2015  9:28 AM

## 2015-10-19 NOTE — Progress Notes (Signed)
[place] Progress Note Patient Name: ANGULA WOOLF DOB: [birth year] MRN: [id number]   Date of Service  10/19/2015  HPI/Events of Note  Patient c/o leg pain.   eICU Interventions  Will order Fentanyl 25 mcg IV Q 2 hours PRN.     Intervention Category Intermediate Interventions: Pain - evaluation and management  Sommer,Steven Eugene 10/19/2015, 4:44 AM

## 2015-10-20 ENCOUNTER — Encounter (HOSPITAL_COMMUNITY): Payer: Self-pay | Admitting: *Deleted

## 2015-10-20 ENCOUNTER — Inpatient Hospital Stay (HOSPITAL_COMMUNITY): Payer: Medicare Other

## 2015-10-20 LAB — GLUCOSE, CAPILLARY
GLUCOSE-CAPILLARY: 128 mg/dL — AB (ref 65–99)
GLUCOSE-CAPILLARY: 112 mg/dL — AB (ref 65–99)
Glucose-Capillary: 100 mg/dL — ABNORMAL HIGH (ref 65–99)
Glucose-Capillary: 108 mg/dL — ABNORMAL HIGH (ref 65–99)
Glucose-Capillary: 113 mg/dL — ABNORMAL HIGH (ref 65–99)

## 2015-10-20 LAB — CBC WITH DIFFERENTIAL/PLATELET
BASOS ABS: 0 10*3/uL (ref 0.0–0.1)
BASOS PCT: 0 %
Eosinophils Absolute: 0.1 10*3/uL (ref 0.0–0.7)
Eosinophils Relative: 1 %
HEMATOCRIT: 28.3 % — AB (ref 36.0–46.0)
HEMOGLOBIN: 8.9 g/dL — AB (ref 12.0–15.0)
Lymphocytes Relative: 11 %
Lymphs Abs: 1 10*3/uL (ref 0.7–4.0)
MCH: 28.3 pg (ref 26.0–34.0)
MCHC: 31.4 g/dL (ref 30.0–36.0)
MCV: 90.1 fL (ref 78.0–100.0)
MONO ABS: 0.7 10*3/uL (ref 0.1–1.0)
Monocytes Relative: 8 %
NEUTROS ABS: 7.5 10*3/uL (ref 1.7–7.7)
NEUTROS PCT: 80 %
Platelets: 244 10*3/uL (ref 150–400)
RBC: 3.14 MIL/uL — AB (ref 3.87–5.11)
RDW: 15.8 % — AB (ref 11.5–15.5)
WBC: 9.3 10*3/uL (ref 4.0–10.5)

## 2015-10-20 LAB — COMPREHENSIVE METABOLIC PANEL
ALBUMIN: 1.7 g/dL — AB (ref 3.5–5.0)
ALT: 29 U/L (ref 14–54)
AST: 44 U/L — AB (ref 15–41)
Alkaline Phosphatase: 40 U/L (ref 38–126)
Anion gap: 9 (ref 5–15)
BILIRUBIN TOTAL: 0.4 mg/dL (ref 0.3–1.2)
BUN: 21 mg/dL — AB (ref 6–20)
CHLORIDE: 101 mmol/L (ref 101–111)
CO2: 28 mmol/L (ref 22–32)
Calcium: 8.2 mg/dL — ABNORMAL LOW (ref 8.9–10.3)
Creatinine, Ser: 0.56 mg/dL (ref 0.44–1.00)
GFR calc Af Amer: >60 mL/min (ref >60)
GFR calc non Af Amer: >60 mL/min (ref >60)
GLUCOSE: 133 mg/dL — AB (ref 65–99)
POTASSIUM: 4 mmol/L (ref 3.5–5.1)
Sodium: 138 mmol/L (ref 135–145)
TOTAL PROTEIN: 5.9 g/dL — AB (ref 6.5–8.1)

## 2015-10-20 LAB — PROCALCITONIN

## 2015-10-20 MED ORDER — METOPROLOL TARTRATE 1 MG/ML IV SOLN
2.5000 mg | Freq: Four times a day (QID) | INTRAVENOUS | Status: DC
  Administered 2015-10-20 – 2015-10-23 (×11): 2.5 mg via INTRAVENOUS
  Filled 2015-10-20 (×11): qty 5

## 2015-10-20 MED ORDER — METOPROLOL SUCCINATE ER 25 MG PO TB24: 37.5000 mg | ORAL_TABLET | Freq: Every evening | ORAL | Status: DC

## 2015-10-20 MED ORDER — ATORVASTATIN CALCIUM 40 MG PO TABS: 40.0000 mg | ORAL_TABLET | Freq: Every day | ORAL | Status: DC

## 2015-10-20 NOTE — Progress Notes (Addendum)
Speech Language Pathology Treatment: Dysphagia  Patient Details Name: Cynthia [name] MRN: [id number] DOB: 11/29/1935 Today's Date: 10/20/2015 Time: [id number] SLP Time Calculation (min) (ACUTE ONLY): 12 min  Assessment / Plan / Recommendation Clinical Impression  SLP provided therapeutic exercise with ice chips. Minimal verbal instruction needed for effortful swallow and supraglottic swallow. Will continue to monitor for readiness for repeat swallow eval (MBS).    HPI HPI: Cynthia [name] is an 80 y.o. female patient presented with paresthesia in UE/LE. 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 Pt developed coffee ground emesis and hypotension - acute UGI bleed on 2/24.  Pt was intubated from 10/11/15 until 10/18/15. She has hx of finding of chronic right  basal ganglia CVA in May 2016 (BSE done, pt normal), metastatic colon cancer s/p Rt colectomy.       SLP Plan  Continue with current plan of care     Recommendations  Diet recommendations: NPO (ice chips ok) Medication Administration: Via alternative means             General recommendations: Rehab consult Oral Care Recommendations: Oral care QID Follow up Recommendations: Inpatient Rehab Plan: Continue with current plan of care     GO                Romuald Mccaslin, Katherene Ponto 10/20/2015, 3:59 PM

## 2015-10-20 NOTE — Progress Notes (Signed)
RT note- FVC-1.55L NIF-40 Good effort

## 2015-10-20 NOTE — Progress Notes (Signed)
NIF-35 FVC 4L with good effort

## 2015-10-20 NOTE — Progress Notes (Signed)
UR Completed Sharalee Witman Graves-Bigelow, RN,BSN [phone]  

## 2015-10-20 NOTE — Progress Notes (Signed)
Physical Therapy Treatment Patient Details Name: Cynthia [name] MRN: [id number] DOB: 1935/10/12 Today's Date: 10/20/2015    History of Present Illness Cynthia [name] is a 80 y.o. female with metastatic colon cancer, status post right colectomy and chemotherapy 2015. In December patient was found to have liver lesions, biopsy confirmed metastatic colon cancer. She underwent ablation of the liver lesions on the tenth of last month.  Was found to have GBS and was intubated 2/26-10/18/15.    PT Comments    Patient very tired today. Strength is slowly improving in BUEs however pt continues to have incoordination. Focused on there ex in supine and bed mobility. Tolerated transfer OOB to chair using maxi-move. Will continue to follow.   Follow Up Recommendations  CIR     Equipment Recommendations  Wheelchair (measurements PT);Wheelchair cushion (measurements PT)    Recommendations for Other Services       Precautions / Restrictions Precautions Precautions: Fall Restrictions Weight Bearing Restrictions: No    Mobility  Bed Mobility Overal bed mobility: Needs Assistance Bed Mobility: Rolling Rolling: Max assist;+2 for physical assistance         General bed mobility comments: Assist to flex knee and reach for rail when rolling to right/left. Difficulty maintaining grip on rail with BUEs.   Transfers Overall transfer level: Needs assistance               General transfer comment: Used maxi move to transfer pt to chair.   Ambulation/Gait                 Stairs            Wheelchair Mobility    Modified Rankin (Stroke Patients Only)       Balance Overall balance assessment: Needs assistance (not formally assessed.) Sitting-balance support: Feet supported;No upper extremity supported Sitting balance-Leahy Scale: Zero Sitting balance - Comments: Requires total A to reposition trunk in chair with pillows for upright.                            Cognition Arousal/Alertness: Lethargic Behavior During Therapy: WFL for tasks assessed/performed Overall Cognitive Status: Within Functional Limits for tasks assessed                      Exercises General Exercises - Lower Extremity Ankle Circles/Pumps: PROM;Both;10 reps;Supine Quad Sets: Both;10 reps;Supine Heel Slides: AAROM;5 reps;Supine    General Comments General comments (skin integrity, edema, etc.): Has rectal pouch, foley, PRAFO.      Pertinent Vitals/Pain Pain Assessment: Faces Faces Pain Scale: Hurts even more Pain Location: legs with movement Pain Descriptors / Indicators: Sore;Aching Pain Intervention(s): Monitored during session;Limited activity within patient's tolerance;Repositioned    Home Living                      Prior Function            PT Goals (current goals can now be found in the care plan section) Progress towards PT goals: Progressing toward goals (slowly)    Frequency  Min 3X/week    PT Plan Current plan remains appropriate    Co-evaluation             End of Session   Activity Tolerance: Patient limited by lethargy Patient left: in chair;with call bell/phone within reach     Time: 1422-1444 PT Time Calculation (min) (ACUTE ONLY): 22 min  Charges:  $Therapeutic  Activity: 8-22 mins                    G Codes:      Deidra Spease A Seleen Walter 10/20/2015, 2:58 PM Wray Kearns, [place], DPT [phone]

## 2015-10-20 NOTE — Progress Notes (Signed)
[facility] PROGRESS NOTE  TANGINA LORDI [id number] DOB: 08/16/1935 DOA: 10/06/2015 PCP: [name], MD  HPI/Brief narrative 80 yo female presented with paresthesia in upper/lower extremities. This was associated with gait ataxia. She developed coffee ground emesis and hypotension . Patient was transferred to the ICU and intubated on 2/26.  Assessment/Plan: 1. GBS 1. Seen by neurology 2. Patient received 5 day course of IVIG with improvement 3. PT/OT consulted with recs for CIR 2. Hypoxic respiratory failure 1. Patient required intubation on PCCM service until 3/7 2. Patient successfully extubated 3/6 and currently remains on min O2 support 3. HTN 1. BP currently stable, albeit suboptimal 2. Home metoprolol on hold as pt remains NPO 3. Will start IV lopressor as tolerated, plan to resume home PO meds when able to tolerate PO 4. Acute blood loss anemia secondary to upper gi bleed 1. Pt was seen by GI and underwent EGD on 3/1 with findings of tiny to small hiatal hernia and prox linear ulcer questionable from NG tube 2. Hgb stable at present 5. HCAP 1. Completed course of abx while in ICU 6. DVT prophylaxis 1. On lovenox subq  Code Status: Full Family Communication: Pt in room Disposition Plan: CIR work up pending   Consultants:  PCCM  Neurology  GI  Procedures:  EGD 3/1  Antibiotics: Anti-infectives    Start     Dose/Rate Route Frequency Ordered Stop   10/14/15 1200  vancomycin (VANCOCIN) IVPB 1000 mg/200 mL premix  Status:  Discontinued     1,000 mg 200 mL/hr over 60 Minutes Intravenous Every 12 hours 10/14/15 1114 10/15/15 0930   10/11/15 2300  vancomycin (VANCOCIN) 500 mg in sodium chloride 0.9 % 100 mL IVPB  Status:  Discontinued     500 mg 100 mL/hr over 60 Minutes Intravenous Every 12 hours 10/11/15 1051 10/14/15 1114   10/11/15 1900  piperacillin-tazobactam (ZOSYN) IVPB 3.375 g  Status:  Discontinued     3.375 g 12.5 mL/hr over 240  Minutes Intravenous Every 8 hours 10/11/15 1051 10/17/15 1044   10/11/15 1100  piperacillin-tazobactam (ZOSYN) IVPB 3.375 g     3.375 g 100 mL/hr over 30 Minutes Intravenous  Once 10/11/15 1051 10/11/15 1155   10/11/15 1100  vancomycin (VANCOCIN) 1,250 mg in sodium chloride 0.9 % 250 mL IVPB     1,250 mg 166.7 mL/hr over 90 Minutes Intravenous  Once 10/11/15 1051 10/11/15 1415       HPI/Subjective: No complaints this AM.  Objective: Filed Vitals:   10/20/15 1100 10/20/15 1137 10/20/15 1200 10/20/15 1614  BP:   158/80   Pulse: 79  88   Temp:  97.9 F (36.6 C)  98.2 F (36.8 C)  TempSrc:  Oral  Oral  Resp: 9  16   Height:      Weight:      SpO2: 99%  100%     Intake/Output Summary (Last 24 hours) at 10/20/15 1624 Last data filed at 10/20/15 1200  Gross per 24 hour  Intake   1500 ml  Output   1125 ml  Net    375 ml   Filed Weights   10/17/15 0418 10/18/15 0300 10/20/15 0429  Weight: 66.6 kg (146 lb 13.2 oz) 64.864 kg (143 lb) 65.1 kg (143 lb 8.3 oz)    Exam:   General:  Awake, in nad  Cardiovascular: regular, s1, s2  Respiratory: normal resp effort, no wheezing  Abdomen: soft,nondistended  Musculoskeletal: perfused, no clubbing   Data Reviewed: Basic  Metabolic Panel:  Recent Labs Lab 10/14/15 0456  10/16/15 0415 10/17/15 0430 10/18/15 0341 10/19/15 0446 10/20/15 0500  NA  --   < > 138 138 140 141 138  K  --   < > 3.7 3.6 3.8 4.0 4.0  CL  --   < > 101 103 101 104 101  CO2  --   < > 30 30 31  32 28  GLUCOSE  --   < > 145* 113* 146* 114* 133*  BUN  --   < > 20 22* 24* 19 21*  CREATININE  --   < > 0.42* 0.53 0.54 0.54 0.56  CALCIUM  --   < > 8.0* 7.8* 8.1* 8.1* 8.2*  MG 2.0  --   --   --   --  1.7  --   PHOS  --   --   --   --   --  3.5  --   < > = values in this interval not displayed. Liver Function Tests:  Recent Labs Lab 10/20/15 0500  AST 44*  ALT 29  ALKPHOS 40  BILITOT 0.4  PROT 5.9*  ALBUMIN 1.7*   No results for input(s):  LIPASE, AMYLASE in the last 168 hours. No results for input(s): AMMONIA in the last 168 hours. CBC:  Recent Labs Lab 10/16/15 0415 10/17/15 0430 10/18/15 0341 10/19/15 0446 10/20/15 0500  WBC 9.5 8.1 11.8* 10.2 9.3  NEUTROABS  --   --  9.8* 8.7* 7.5  HGB 8.7* 7.6* 8.6* 8.1* 8.9*  HCT 27.6* 24.4* 27.5* 26.2* 28.3*  MCV 90.5 91.0 91.1 90.7 90.1  PLT 273 217 229 208 244   Cardiac Enzymes: No results for input(s): CKTOTAL, CKMB, CKMBINDEX, TROPONINI in the last 168 hours. BNP (last 3 results) No results for input(s): BNP in the last 8760 hours.  ProBNP (last 3 results) No results for input(s): PROBNP in the last 8760 hours.  CBG:  Recent Labs Lab 10/19/15 1605 10/19/15 1954 10/20/15 0427 10/20/15 0800 10/20/15 1136  GLUCAP 117* 94 100* 128* 112*    Recent Results (from the past 240 hour(s))  Urine culture     Status: None   Collection Time: 10/11/15  9:36 AM  Result Value Ref Range Status   Specimen Description URINE, RANDOM  Final   Special Requests NONE  Final   Culture NO GROWTH 1 DAY  Final   Report Status 10/12/2015 FINAL  Final  Culture, respiratory (NON-Expectorated)     Status: None   Collection Time: 10/11/15 10:45 AM  Result Value Ref Range Status   Specimen Description TRACHEAL ASPIRATE  Final   Special Requests NONE  Final   Gram Stain   Final    RARE WBC NO SQUAMOUS EPITHELIAL CELLS SEEN FEW GRAM POSITIVE COCCI IN CLUSTERS Performed at [facility]    Culture   Final    NORMAL OROPHARYNGEAL FLORA Performed at [facility]    Report Status 10/15/2015 FINAL  Final  Culture, blood (routine x 2)     Status: None   Collection Time: 10/11/15 11:45 AM  Result Value Ref Range Status   Specimen Description BLOOD LEFT ANTECUBITAL  Final   Special Requests BOTTLES DRAWN AEROBIC AND ANAEROBIC  5CC  Final   Culture NO GROWTH 5 DAYS  Final   Report Status 10/16/2015 FINAL  Final  Culture, blood (routine x 2)     Status: None   Collection  Time: 10/11/15 11:52 AM  Result Value Ref  Range Status   Specimen Description BLOOD LEFT ARM  Final   Special Requests IN PEDIATRIC BOTTLE  [facility]  Final   Culture NO GROWTH 5 DAYS  Final   Report Status 10/16/2015 FINAL  Final     Studies: Dg Chest Port 1 View  10/20/2015  CLINICAL DATA:  Guillain-Barre syndrome. EXAM: PORTABLE CHEST 1 VIEW COMPARISON:  10/19/2015. FINDINGS: Right IJ sheath and feeding tube in stable position. Mediastinum and hilar structures are stable. Interim improvement of bibasilar atelectasis. Small bilateral pleural effusions. No pneumothorax. IMPRESSION: 1. Lines and tubes in stable position. 2. Interim partial clearing of bibasilar atelectasis. Small bilateral pleural effusions are again noted . Electronically Signed   By: Marcello Moores  Register   On: 10/20/2015 07:49   Dg Chest Port 1 View  10/19/2015  CLINICAL DATA:  Respiratory failure. EXAM: PORTABLE CHEST 1 VIEW COMPARISON:  10/18/2015. FINDINGS: Interim extubation. Right IJ sheath and feeding tube in stable position. Low lung volumes. Interim partial clearing of left base atelectasis and/or infiltrate. Mild right base and right mid lung field subsegmental atelectasis. Persistent small left pleural effusion. Small left pleural effusion. No pneumothorax. IMPRESSION: 1. Interim extubation. Right IJ sheath and feeding tube in stable position. 2. Low lung volumes. Interim partial clearing of left base atelectasis and/or infiltrate. Mild right base and right mid lung field subsegmental atelectasis. Persistent small left pleural effusion. Continued follow-up exam suggested to demonstrate clearing to exclude underlying focal lesion. Electronically Signed   By: Marcello Moores  Register   On: 10/19/2015 07:19   Dg Abd Portable 1v  10/20/2015  CLINICAL DATA:  Encounter for feeding tube placement. EXAM: PORTABLE ABDOMEN - 1 VIEW COMPARISON:  October 14, 2015. FINDINGS: The bowel gas pattern is normal. Distal tip of feeding tube is seen in expected  position of distal stomach or first portion of duodenum. IMPRESSION: Distal tip of feeding tube seen in expected position of distal stomach or first portion of duodenum. Electronically Signed   By: Marijo Conception, M.D.   On: 10/20/2015 14:00    Scheduled Meds: . antiseptic oral rinse  7 mL Mouth Rinse q12n4p  . chlorhexidine  15 mL Mouth Rinse BID  . enoxaparin (LOVENOX) injection  40 mg Subcutaneous Q24H  . feeding supplement (PRO-STAT SUGAR FREE 64)  30 mL Per Tube BID  . gabapentin  500 mg Oral 3 times per day  . Gerhardt's butt cream   Topical QID  . lidocaine  2 patch Transdermal Q24H  . pantoprazole sodium  40 mg Per Tube Q24H   Continuous Infusions: . dextrose 5 % and 0.45 % NaCl with KCl 20 mEq/L 30 mL/hr at 10/20/15 0800  . feeding supplement (VITAL AF 1.2 CAL) 1,000 mL (10/20/15 0000)    Active Problems:   Colon cancer metastasized to liver [facility])   Peripheral neuropathy (HCC)   Paresthesia of both hands   Paresthesia of both feet   Cerebral embolism with cerebral infarction   Shock ([place])   Hemoptysis   Transverse myelitis ([place])   Acute respiratory failure ([place])   Aspiration into airway   Endotracheally intubated   GBS (Guillain-Barre syndrome) ([place])   [facility], [facility] Pager [phone]. If 7PM-7AM, please contact night-coverage at [email or web address], password [facility] 10/20/2015, 4:24 PM  LOS: 11 days

## 2015-10-20 NOTE — Care Management Note (Addendum)
Case Management Note  Patient Details  Name: Cynthia [name] MRN: [id number] Date of Birth: 1935-12-06  Subjective/Objective:     Pt transferred from the unit. 80 yo female presented with paresthesia in upper/lower extremities- associated with gait ataxia.Pt developed coffee ground emesis and hypotension . Patient was transferred to the ICU and intubated on 2/26 extubated on 10-19-15. GBS- Consulted by neurology -Patient received 5 day course of IVIG with improvement. PT/OT consulted with recommendation for CIR- Orders placed for CIR to consult.              Action/Plan: CM did reach out to [facility] with CIR- she is following the patient. CM will continue to monitor for disposition needs.   Expected Discharge Date:                  Expected Discharge Plan:  [place]  In-House Referral:     Discharge planning Services  CM Consult  Post Acute Care Choice:   N/A Choice offered to:   N/A  DME Arranged:   N/A DME Agency:   N/A  HH Arranged:   N/A HH Agency:   N/A  Status of Service:  Completed.  Medicare Important Message Given:  Yes Date Medicare IM Given:    Medicare IM give by:    Date Additional Medicare IM Given:    Additional Medicare Important Message give by:     If discussed at [place] of Stay Meetings, dates discussed:    Additional Comments: [place]erton Chapel, RN,BSN [phone] Pt approved for CIR. Plan for d/c today. No further needs from CM at this time.  Bethena Roys, RN 10/20/2015, 4:20 PM

## 2015-10-20 NOTE — Progress Notes (Signed)
Report given to [facility], pt transported on bed, maintained on cardiac monitor, room air, VS. Stable. No acute distress. Accompanied by RN.

## 2015-10-20 NOTE — Care Management Important Message (Signed)
Important Message  Patient Details  Name: Cynthia [name] MRN: [id number] Date of Birth: 1936/01/17   Medicare Important Message Given:  Yes    Loann Quill 10/20/2015, 8:25 AM

## 2015-10-21 DIAGNOSIS — R202 Paresthesia of skin: Secondary | ICD-10-CM

## 2015-10-21 DIAGNOSIS — G8250 Quadriplegia, unspecified: Secondary | ICD-10-CM

## 2015-10-21 LAB — CBC
HCT: 29.9 % — ABNORMAL LOW (ref 36.0–46.0)
HEMOGLOBIN: 9.5 g/dL — AB (ref 12.0–15.0)
MCH: 28.5 pg (ref 26.0–34.0)
MCHC: 31.8 g/dL (ref 30.0–36.0)
MCV: 89.8 fL (ref 78.0–100.0)
PLATELETS: 265 10*3/uL (ref 150–400)
RBC: 3.33 MIL/uL — AB (ref 3.87–5.11)
RDW: 15.7 % — ABNORMAL HIGH (ref 11.5–15.5)
WBC: 10 10*3/uL (ref 4.0–10.5)

## 2015-10-21 LAB — GLUCOSE, CAPILLARY
GLUCOSE-CAPILLARY: 108 mg/dL — AB (ref 65–99)
GLUCOSE-CAPILLARY: 110 mg/dL — AB (ref 65–99)
GLUCOSE-CAPILLARY: 121 mg/dL — AB (ref 65–99)
Glucose-Capillary: 115 mg/dL — ABNORMAL HIGH (ref 65–99)
Glucose-Capillary: 106 mg/dL — ABNORMAL HIGH (ref 65–99)
Glucose-Capillary: 113 mg/dL — ABNORMAL HIGH (ref 65–99)

## 2015-10-21 LAB — BASIC METABOLIC PANEL
Anion gap: 8 (ref 5–15)
BUN: 20 mg/dL (ref 6–20)
CALCIUM: 8.1 mg/dL — AB (ref 8.9–10.3)
CHLORIDE: 103 mmol/L (ref 101–111)
CO2: 22 mmol/L (ref 22–32)
CREATININE: 0.46 mg/dL (ref 0.44–1.00)
GFR calc Af Amer: >60 mL/min (ref >60)
Glucose, Bld: 108 mg/dL — ABNORMAL HIGH (ref 65–99)
POTASSIUM: 4.3 mmol/L (ref 3.5–5.1)
SODIUM: 133 mmol/L — AB (ref 135–145)

## 2015-10-21 LAB — PROCALCITONIN: Procalcitonin: <0.1 ng/mL

## 2015-10-21 NOTE — Progress Notes (Signed)
RT note- FVC-1.48L NIF-38 Good effort

## 2015-10-21 NOTE — Consult Note (Signed)
Physical Medicine and Rehabilitation Consult Reason for Consult: GBS/multi-medical Referring Physician: [facility]   HPI: Cynthia [name] is a 80 y.o. right handed female with history of hypertension, CVA maintained on Plavix and little residual weakness, metastatic colon cancer status post right colectomy and chemotherapy 2015 followed by radiation oncology Dr. Lisbeth Renshaw.. Recently underwent ablation of liver lesions 09/25/2015. Patient lives with daughter and independent prior to admission. One level home. Presented 10/06/2015 with complaints of tingling in her hands and feet and numbness to right arm and leg with associated dizziness. Cranial CT scan MRI showed no acute abnormalities. Echocardiogram with ejection fraction 65% mild MR. MRI thoracic lumbar spine show peripheral enhancement of the distal thoracic spinal cord as well as the nerve roots suggesting Guillain-Barr syndrome. Neurology follow-up placed on IVIG 5 days. Patient with acute respiratory failure required intubation 10/11/15- 10/18/2015. Hospital course gastroenterology for coffee-ground emesis hemoglobin 7.6 with EGD completed showing no active bleeding there was a tiny to small hiatal hernia. Hemoglobin and hematocrit remained stable latest hemoglobin 9.5. Patient has a nasogastric tube for  for nutritional support and currently remains nothing by mouth with follow-up per speech therapy. Marland KitchenHCAP with antibiotic course completed. Subcutaneous Lovenox added for DVT prophylaxis 10/16/2015. Physical therapy evaluation completed and ongoing with recommendations of physical medicine rehabilitation consult.  Still has rectal tube, last IV fentanyl yesterday  Review of Systems  Constitutional: Positive for malaise/fatigue. Negative for fever and chills.  HENT: Negative for hearing loss.   Eyes: Negative for blurred vision and double vision.  Respiratory: Negative for cough.        Some shortness of breath with heavy exertion    Cardiovascular: Positive for leg swelling. Negative for chest pain and palpitations.  Gastrointestinal: Positive for constipation. Negative for nausea and vomiting.  Genitourinary: Negative for dysuria and hematuria.  Musculoskeletal: Positive for myalgias and joint pain.  Skin: Negative for rash.  Neurological: Positive for weakness. Negative for seizures and headaches.  All other systems reviewed and are negative.  Past Medical History  Diagnosis Date  . Anemia   . Blood transfusion without reported diagnosis jan 2015  . Heart murmur   . Hypertension     no bp meds   . Liver cyst   . C. difficile colitis 03/05/14, 03/22/14, 04/05/14    recurrent  . Stroke ([place]) 12/2014    NUMBNESS ON LEFT SIDE  . Cervical cancer ([place]) 1972  . Colon cancer ([place]) JAN 2015  . Colon cancer ([place])   . Cancer ([place])     liver  . Complication of anesthesia     slow to awaken in past, DONE WELL RECENTLY  . Tinnitus of both ears ALL THE TIME  . DVT (deep venous thrombosis) ([place]) 10/24/13    Right leg   Past Surgical History  Procedure Laterality Date  . Back surgery  1981    lower lumbar  . Tubal ligation  1968  . Laparoscopic right colectomy Right 10/22/2013    Procedure: LAPAROSCOPIC RIGHT COLECTOMY ;  Surgeon: [name], MD;  Location: [facility] ORS;  Service: General;  Laterality: Right;  . Salpingoophorectomy  10/22/2013    Procedure: [place] Saa;  Surgeon: [name]. [name], MD;  Location: [facility] ORS;  Service: Gynecology;;  . Abdominal hysterectomy      vaginal- partial  . Esophagogastroduodenoscopy N/A 10/14/2015    Procedure: ESOPHAGOGASTRODUODENOSCOPY (EGD);  Surgeon: Clarene Essex, MD;  Location: [facility] ENDOSCOPY;  Service: Endoscopy;  Laterality: N/A;  Bedside in  ICU   Family History  Problem Relation Age of Onset  . Diabetes Sister   . Cancer Sister     leukemia  . Diabetes Brother   . Heart failure Brother   . Hypertension Brother   . Breast cancer Maternal Aunt   . Rheum  arthritis Mother   . Heart failure Father   . Cancer Maternal Grandmother     GIST  . Heart failure Maternal Grandfather   . Aneurysm Paternal Grandmother    Social History:  reports that she has never smoked. She has never used smokeless tobacco. She reports that she does not drink alcohol or use illicit drugs. Allergies: No Known Allergies Medications Prior to Admission  Medication Sig Dispense Refill  . acetaminophen (TYLENOL) 500 MG tablet Take 500 mg by mouth every 6 (six) hours as needed for mild pain.    Marland Kitchen aspirin EC 81 MG tablet Take 1 tablet (81 mg total) by mouth daily. (Patient taking differently: Take 81 mg by mouth every morning. ) 90 tablet 0  . atorvastatin (LIPITOR) 40 MG tablet Take 1 tablet (40 mg total) by mouth daily. 90 tablet 0  . metoprolol succinate (TOPROL-XL) 25 MG 24 hr tablet Take 37.5 mg by mouth every evening.     . Multiple Vitamin (MULTIVITAMIN WITH MINERALS) TABS tablet Take 1 tablet by mouth every morning.       Home: Home Living Family/patient expects to be discharged to:: Inpatient rehab Living Arrangements: Children Available Help at Discharge: Family, Available 24 hours/day Type of Home: House Home Access: Stairs to enter [employer] of Steps: 1 Entrance Stairs-Rails: None Home Layout: One level Bathroom Shower/Tub: Chiropodist: Standard Bathroom Accessibility: Yes Home Equipment: Environmental consultant - 2 wheels, Walker - standard, Cane - quad, Crutches, Bedside commode, Shower seat, Grab bars - tub/shower  Functional History: Prior Function Level of Independence: Independent Functional Status:  Mobility: Bed Mobility Overal bed mobility: Needs Assistance Bed Mobility: Rolling Rolling: Max assist, +2 for physical assistance Sidelying to sit: Total assist, +2 for physical assistance Sit to supine: +2 for physical assistance, Total assist Sit to sidelying: Min assist (assist to bring LEs onto bed) General bed mobility  comments: Assist to flex knee and reach for rail when rolling to right/left. Difficulty maintaining grip on rail with BUEs.  Transfers Overall transfer level: Needs assistance Transfer via Lift Equipment: Maximove Transfers: Lateral/Scoot Transfers  Lateral/Scoot Transfers: Max assist General transfer comment: Used maxi move to transfer pt to chair.       ADL: ADL Overall ADL's : Needs assistance/impaired Eating/Feeding: Set up, Sitting Grooming: Minimal assistance, Sitting Upper Body Bathing: Minimal assitance, Sitting Lower Body Bathing: Moderate assistance, +2 for safety/equipment, Sit to/from stand Upper Body Dressing : Minimal assistance, Sitting Upper Body Dressing Details (indicate cue type and reason): Pt able to doff/don hospital gown at bed level. Lower Body Dressing: Moderate assistance, Sit to/from stand, +2 for safety/equipment Lower Body Dressing Details (indicate cue type and reason): Pt unable to reach feet to pull up socks secondary to pain in legs and lower back General ADL Comments: Pts daughter present for OT eval. Pt unable to identify that she had urinated in bed and was wet. Transfers were not attempted at this time; pt reports she is in too much pain to attempt standing.  Cognition: Cognition Overall Cognitive Status: Within Functional Limits for tasks assessed Orientation Level: Oriented X4 Cognition Arousal/Alertness: Lethargic Behavior During Therapy: WFL for tasks assessed/performed Overall Cognitive Status: Within Functional Limits  for tasks assessed  Blood pressure 143/73, pulse 70, temperature 97.9 F (36.6 C), temperature source Oral, resp. rate 16, height _0  (1.575 m), weight 62.823 kg (138 lb 8 oz), SpO2 100 %. Physical Exam  Constitutional: She is oriented to person, place, and time. She appears well-developed.  HENT:  Nasogastric tube in place  Eyes:  Pupils round and reactive to light  Neck: Neck supple. No thyromegaly present.    Cardiovascular: Regular rhythm.   Respiratory:  Decreased breath sounds at the bases but clear to auscultation  GI: Soft. Bowel sounds are normal. She exhibits no distension.  Neurological: She is alert and oriented to person, place, and time.  Patient interacts to conversation. Follows simple commands. Fair awareness of deficits.  Skin: Skin is warm and dry.  Sensation absent LT in BUE and BLE Motor 3/5 in bilat delt bi tri grip, 2- HF, 3- knee ext 0 ADF Sensory ataxia in UEs  Results for orders placed or performed during the hospital encounter of 10/06/15 (from the past 24 hour(s))  Glucose, capillary     Status: Abnormal   Collection Time: 10/20/15  8:00 AM  Result Value Ref Range   Glucose-Capillary 128 (H) 65 - 99 mg/dL   Comment 1 Capillary Specimen    Comment 2 Notify RN   Glucose, capillary     Status: Abnormal   Collection Time: 10/20/15 11:36 AM  Result Value Ref Range   Glucose-Capillary 112 (H) 65 - 99 mg/dL   Comment 1 Capillary Specimen    Comment 2 Notify RN   Glucose, capillary     Status: Abnormal   Collection Time: 10/20/15  4:12 PM  Result Value Ref Range   Glucose-Capillary 108 (H) 65 - 99 mg/dL  Glucose, capillary     Status: Abnormal   Collection Time: 10/20/15  7:40 PM  Result Value Ref Range   Glucose-Capillary 113 (H) 65 - 99 mg/dL  Glucose, capillary     Status: Abnormal   Collection Time: 10/21/15 12:57 AM  Result Value Ref Range   Glucose-Capillary 110 (H) 65 - 99 mg/dL  Glucose, capillary     Status: Abnormal   Collection Time: 10/21/15  3:58 AM  Result Value Ref Range   Glucose-Capillary 121 (H) 65 - 99 mg/dL   Comment 1 Notify RN    Comment 2 Document in Chart   Basic metabolic panel     Status: Abnormal   Collection Time: 10/21/15  4:00 AM  Result Value Ref Range   Sodium 133 (L) 135 - 145 mmol/L   Potassium 4.3 3.5 - 5.1 mmol/L   Chloride 103 101 - 111 mmol/L   CO2 22 22 - 32 mmol/L   Glucose, Bld 108 (H) 65 - 99 mg/dL   BUN 20 6 -  20 mg/dL   Creatinine, Ser 0.46 0.44 - 1.00 mg/dL   Calcium 8.1 (L) 8.9 - 10.3 mg/dL   GFR calc non Af Amer >60 >60 mL/min   GFR calc Af Amer >60 >60 mL/min   Anion gap 8 5 - 15  CBC     Status: Abnormal   Collection Time: 10/21/15  4:00 AM  Result Value Ref Range   WBC 10.0 4.0 - 10.5 K/uL   RBC 3.33 (L) 3.87 - 5.11 MIL/uL   Hemoglobin 9.5 (L) 12.0 - 15.0 g/dL   HCT 29.9 (L) 36.0 - 46.0 %   MCV 89.8 78.0 - 100.0 fL   MCH 28.5 26.0 - 34.0 pg  MCHC 31.8 30.0 - 36.0 g/dL   RDW 15.7 (H) 11.5 - 15.5 %   Platelets 265 150 - 400 K/uL   Dg Chest Port 1 View  10/20/2015  CLINICAL DATA:  Guillain-Barre syndrome. EXAM: PORTABLE CHEST 1 VIEW COMPARISON:  10/19/2015. FINDINGS: Right IJ sheath and feeding tube in stable position. Mediastinum and hilar structures are stable. Interim improvement of bibasilar atelectasis. Small bilateral pleural effusions. No pneumothorax. IMPRESSION: 1. Lines and tubes in stable position. 2. Interim partial clearing of bibasilar atelectasis. Small bilateral pleural effusions are again noted . Electronically Signed   By: Marcello Moores  Register   On: 10/20/2015 07:49   Dg Abd Portable 1v  10/20/2015  CLINICAL DATA:  Encounter for feeding tube placement. EXAM: PORTABLE ABDOMEN - 1 VIEW COMPARISON:  October 14, 2015. FINDINGS: The bowel gas pattern is normal. Distal tip of feeding tube is seen in expected position of distal stomach or first portion of duodenum. IMPRESSION: Distal tip of feeding tube seen in expected position of distal stomach or first portion of duodenum. Electronically Signed   By: Marijo Conception, M.D.   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 Does the need for close, 24 hr/day medical supervision in concert with the patient's rehab needs make it unreasonable for this patient to be served in a less intensive setting? Yes 2. Co-Morbidities requiring supervision/potential complications: s/p colectomy for hx of colon ca, recent  ablation of liver met 3. Due to bladder management, bowel management, safety, skin/wound care, disease management, medication administration, pain management and patient education, does the patient require 24 hr/day rehab nursing? Yes 4. Does the patient require coordinated care of a physician, rehab nurse, PT (1-2 hrs/day, 5 days/week), OT (1-2 hrs/day, 5 days/week) and SLP (.5-1 hrs/day, 5 days/week) to address physical and functional deficits in the context of the above medical diagnosis(es)? Yes Addressing deficits in the following areas: balance, endurance, locomotion, strength, transferring, bowel/bladder control, bathing, dressing, feeding, grooming, toileting, cognition, speech, language, swallowing and psychosocial support 5. Can the patient actively participate in an intensive therapy program of at least 3 hrs of therapy per day at least 5 days per week? not currently 6. The potential for patient to make measurable gains while on inpatient rehab is fair 7. Anticipated functional outcomes upon discharge from inpatient rehab are min assist and mod assist  with PT, min assist and mod assist with OT, min assist and mod assist with SLP. 8. Estimated rehab length of stay to reach the above functional goals is: 22-28d 9. Does the patient have adequate social supports and living environment to accommodate these discharge functional goals? Potentially 10. Anticipated D/C setting: Home 11. Anticipated post D/C treatments: [place] therapy 12. Overall Rehab/Functional Prognosis: good and fair  RECOMMENDATIONS: This patient's condition is appropriate for continued rehabilitative care in the following setting: CIR once rectal tube is out and off IV fentanyl Patient has agreed to participate in recommended program. Yes Note that insurance prior authorization may be required for reimbursement for recommended care.  Comment:     10/21/2015

## 2015-10-21 NOTE — Progress Notes (Signed)
PROGRESS NOTE  Cynthia [name] [id number] DOB: 09-01-1935 DOA: 10/06/2015 PCP: [name], MD Outpatient Specialists:    LOS: 12 days   Brief Narrative: 80 yo female presented with paresthesia in upper/lower extremities. This was associated with gait ataxia. She developed coffee ground emesis and hypotension . Patient was transferred to the ICU and intubated on 2/26.  Assessment & Plan: Active Problems:   Colon cancer metastasized to liver [facility])   Peripheral neuropathy (HCC)   Paresthesia of both hands   Paresthesia of both feet   Cerebral embolism with cerebral infarction   Shock ([place])   Hemoptysis   Transverse myelitis ([place])   Acute respiratory failure (HCC)   Aspiration into airway   Endotracheally intubated   GBS (Guillain-Barre syndrome) (HCC)   GBS - neurology was consulted and have followed patient while hospitalized, she has received 5 day course of IVIG with subsequent improvement in her weakness.  - PT/OT consulted with recs for CIR Hypoxic respiratory failure - Patient required intubation on PCCM service until 3/7, successfully extubated 3/6 and currently remains on min O2 support - continue NIF HTN - BP currently stable, albeit suboptimal - Home metoprolol on hold as pt remains NPO - Will start IV lopressor as tolerated, plan to resume home PO meds when able to tolerate PO Acute blood loss anemia secondary to upper gi bleed - Pt was seen by GI and underwent EGD on 3/1 with findings of tiny to small hiatal hernia and prox linear ulcer questionable from NG tube - Hgb stable at present HCAP Completed course of abx while in ICU with Vancomycin and Zosyn, end date 3/4   DVT prophylaxis: Lovenox Code Status: Full Family Communication: no family bedside Disposition Plan: CIR vs SNF   Consultants:   Neurology   PCCM  Procedures:   EGD 3/1  Cortrak tube  Antimicrobials:  Vancomycin / Zosyn - finished course 3/4  Subjective: - no  complaints, she is feeling stronger today, denies any chest pain, denies any shortness of breath.  Objective: Filed Vitals:   10/21/15 0400 10/21/15 0623 10/21/15 0820 10/21/15 1250  BP:  149/80 162/90   Pulse:  72 92   Temp:   97.5 F (36.4 C) 97.6 F (36.4 C)  TempSrc:   Oral Oral  Resp:  15 18   Height:      Weight: 62.823 kg (138 lb 8 oz)     SpO2:  100% 100%     Intake/Output Summary (Last 24 hours) at 10/21/15 1433 Last data filed at 10/21/15 1303  Gross per 24 hour  Intake 1021.5 ml  Output   1400 ml  Net -378.5 ml   Filed Weights   10/18/15 0300 10/20/15 0429 10/21/15 0400  Weight: 64.864 kg (143 lb) 65.1 kg (143 lb 8.3 oz) 62.823 kg (138 lb 8 oz)    Examination: BP 162/90 mmHg  Pulse 92  Temp(Src) 97.6 F (36.4 C) (Oral)  Resp 18  Ht 5\' 2"  (1.575 m)  Wt 62.823 kg (138 lb 8 oz)  BMI 25.33 kg/m2  SpO2 100% General exam: NAD Respiratory system: Clear. No increased work of breathing. No wheezing, no crackles Cardiovascular system: regular rate and rhythm, no murmurs, gallops. No JVD. No peripheral edema.  Gastrointestinal system: Abdomen is nondistended, soft and nontender. Normal bowel sounds heard. Central nervous system: AxOx3. Strength 4 out of 5 in the upper extremities, 1 out of 5 in the lower extremities. Extremities: No clubbing/cyanosis Skin: no rashes   Data Reviewed:  I have personally reviewed following labs and imaging studies  CBC:  Recent Labs Lab 10/17/15 0430 10/18/15 0341 10/19/15 0446 10/20/15 0500 10/21/15 0400  WBC 8.1 11.8* 10.2 9.3 10.0  NEUTROABS  --  9.8* 8.7* 7.5  --   HGB 7.6* 8.6* 8.1* 8.9* 9.5*  HCT 24.4* 27.5* 26.2* 28.3* 29.9*  MCV 91.0 91.1 90.7 90.1 89.8  PLT 217 229 208 244 [id number]   Basic Metabolic Panel:  Recent Labs Lab 10/17/15 0430 10/18/15 0341 10/19/15 0446 10/20/15 0500 10/21/15 0400  NA 138 140 141 138 133*  K 3.6 3.8 4.0 4.0 4.3  CL 103 101 104 101 103  CO2 30 31 32 28 22  GLUCOSE 113* 146* 114*  133* 108*  BUN 22* 24* 19 21* 20  CREATININE 0.53 0.54 0.54 0.56 0.46  CALCIUM 7.8* 8.1* 8.1* 8.2* 8.1*  MG  --   --  1.7  --   --   PHOS  --   --  3.5  --   --    GFR: Estimated Creatinine Clearance: 51.3 mL/min (by C-G formula based on Cr of 0.46). Liver Function Tests:  Recent Labs Lab 10/20/15 0500  AST 44*  ALT 29  ALKPHOS 40  BILITOT 0.4  PROT 5.9*  ALBUMIN 1.7*   No results for input(s): LIPASE, AMYLASE in the last 168 hours. No results for input(s): AMMONIA in the last 168 hours. Coagulation Profile: No results for input(s): INR, PROTIME in the last 168 hours. Cardiac Enzymes: No results for input(s): CKTOTAL, CKMB, CKMBINDEX, TROPONINI in the last 168 hours. BNP (last 3 results) No results for input(s): PROBNP in the last 8760 hours. HbA1C: No results for input(s): HGBA1C in the last 72 hours. CBG:  Recent Labs Lab 10/20/15 1940 10/21/15 0057 10/21/15 0358 10/21/15 0817 10/21/15 1127  GLUCAP 113* 110* 121* 108* 115*   Lipid Profile: No results for input(s): CHOL, HDL, LDLCALC, TRIG, CHOLHDL, LDLDIRECT in the last 72 hours. Thyroid Function Tests: No results for input(s): TSH, T4TOTAL, FREET4, T3FREE, THYROIDAB in the last 72 hours. Anemia Panel: No results for input(s): VITAMINB12, FOLATE, FERRITIN, TIBC, IRON, RETICCTPCT in the last 72 hours. Urine analysis:    Component Value Date/Time   COLORURINE YELLOW 10/06/2015 [place] 10/06/2015 0733   LABSPEC 1.009 10/06/2015 0733   PHURINE 8.0 10/06/2015 0733   GLUCOSEU NEGATIVE 10/06/2015 0733   HGBUR NEGATIVE 10/06/2015 0733   BILIRUBINUR NEGATIVE 10/06/2015 0733   KETONESUR NEGATIVE 10/06/2015 0733   PROTEINUR NEGATIVE 10/06/2015 0733   UROBILINOGEN 0.2 01/11/2015 1725   NITRITE NEGATIVE 10/06/2015 0733   LEUKOCYTESUR NEGATIVE 10/06/2015 0733   Sepsis Labs: Invalid input(s): PROCALCITONIN, LACTICIDVEN  No results found for this or any previous visit (from the past 240 hour(s)).     Radiology Studies: Dg Chest Port 1 View  10/20/2015  CLINICAL DATA:  Guillain-Barre syndrome. EXAM: PORTABLE CHEST 1 VIEW COMPARISON:  10/19/2015. FINDINGS: Right IJ sheath and feeding tube in stable position. Mediastinum and hilar structures are stable. Interim improvement of bibasilar atelectasis. Small bilateral pleural effusions. No pneumothorax. IMPRESSION: 1. Lines and tubes in stable position. 2. Interim partial clearing of bibasilar atelectasis. Small bilateral pleural effusions are again noted . Electronically Signed   By: Marcello Moores  Register   On: 10/20/2015 07:49   Dg Abd Portable 1v  10/20/2015  CLINICAL DATA:  Encounter for feeding tube placement. EXAM: PORTABLE ABDOMEN - 1 VIEW COMPARISON:  October 14, 2015. FINDINGS: The bowel gas pattern is normal. Distal  tip of feeding tube is seen in expected position of distal stomach or first portion of duodenum. IMPRESSION: Distal tip of feeding tube seen in expected position of distal stomach or first portion of duodenum. Electronically Signed   By: Marijo Conception, M.D.   On: 10/20/2015 14:00     Scheduled Meds: . antiseptic oral rinse  7 mL Mouth Rinse q12n4p  . chlorhexidine  15 mL Mouth Rinse BID  . enoxaparin (LOVENOX) injection  40 mg Subcutaneous Q24H  . feeding supplement (PRO-STAT SUGAR FREE 64)  30 mL Per Tube BID  . gabapentin  500 mg Oral 3 times per day  . Gerhardt's butt cream   Topical QID  . lidocaine  2 patch Transdermal Q24H  . metoprolol  2.5 mg Intravenous 4 times per day  . pantoprazole sodium  40 mg Per Tube Q24H   Continuous Infusions: . dextrose 5 % and 0.45 % NaCl with KCl 20 mEq/L 30 mL/hr at 10/20/15 2247  . feeding supplement (VITAL AF 1.2 CAL) 1,000 mL (10/20/15 2250)     Marzetta Board, MD, PhD [facility] Pager [phone]  If 7PM-7AM, please contact night-coverage [email or web address] Password [facility] 10/21/2015, 2:33 PM

## 2015-10-21 NOTE — Progress Notes (Signed)
I met with pt and her daughter at bedside. Both prefer inpt rehab when medically ready. Noted rectal tube. I iwll begin insurance authorization when rectal tube no longer needed and tolerance for more intense therapy improved. [phone]

## 2015-10-22 LAB — URINALYSIS, ROUTINE W REFLEX MICROSCOPIC
BILIRUBIN URINE: NEGATIVE
Glucose, UA: NEGATIVE mg/dL
KETONES UR: NEGATIVE mg/dL
NITRITE: POSITIVE — AB
PH: 7 (ref 5.0–8.0)
Protein, ur: NEGATIVE mg/dL
Specific Gravity, Urine: 1.015 (ref 1.005–1.030)

## 2015-10-22 LAB — URINE MICROSCOPIC-ADD ON

## 2015-10-22 LAB — PROCALCITONIN: Procalcitonin: <0.1 ng/mL

## 2015-10-22 LAB — GLUCOSE, CAPILLARY
GLUCOSE-CAPILLARY: 103 mg/dL — AB (ref 65–99)
GLUCOSE-CAPILLARY: 106 mg/dL — AB (ref 65–99)
GLUCOSE-CAPILLARY: 107 mg/dL — AB (ref 65–99)
GLUCOSE-CAPILLARY: 108 mg/dL — AB (ref 65–99)
GLUCOSE-CAPILLARY: 116 mg/dL — AB (ref 65–99)
GLUCOSE-CAPILLARY: 118 mg/dL — AB (ref 65–99)
Glucose-Capillary: 114 mg/dL — ABNORMAL HIGH (ref 65–99)

## 2015-10-22 MED ORDER — GABAPENTIN 250 MG/5ML PO SOLN
300.0000 mg | Freq: Three times a day (TID) | ORAL | Status: DC
  Administered 2015-10-22 (×2): 300 mg via ORAL
  Filled 2015-10-22 (×5): qty 6

## 2015-10-22 MED ORDER — DEXTROSE 5 % IV SOLN
1.0000 g | INTRAVENOUS | Status: DC
  Administered 2015-10-22 – 2015-10-27 (×6): 1 g via INTRAVENOUS
  Filled 2015-10-22 (×6): qty 10

## 2015-10-22 NOTE — Clinical Social Work Note (Signed)
Clinical Social Work Assessment  Patient Details  Name: Cynthia [name] MRN: [id number] Date of Birth: 1936-07-12  Date of referral:  10/22/15               Reason for consult:  Discharge Planning                Permission sought to share information with:  Family Supports Permission granted to share information::  Yes, Verbal Permission Granted  Name::     Tax adviser::     Relationship::     Contact Information:     Housing/Transportation Living arrangements for the past 2 months:  Single Family Home Source of Information:  Patient Patient Interpreter Needed:  None Criminal Activity/Legal Involvement Pertinent to Current Situation/Hospitalization:  No - Comment as needed Significant Relationships:  Adult Children Lives with:  Self Do you feel safe going back to the place where you live?  Yes Need for family participation in patient care:  Yes (Comment)  Care giving concerns:  The patient is hopefull that she will be able to go to CIR at discharge.   Social Worker assessment / plan:  CSW met with patient and patient's daughter at bedside. The patient and daughter state that they are hoping for a CIR admission. The patient and daughter are not interested in looking at SNF for discharge as both feel CIR will serve the patient best. CSW explained that CIR admission cannot be guaranteed and the purpose of looking at SNF as alternative. The patient and daughter still want to refrain from SNF referrals at this time. CSW will return if CIR is not likely. The patient appears to have strong family support.  Employment status:  Retired Nurse, adult PT Recommendations:  Inpatient [place] / Referral to community resources:  Other (Comment Required) (Patient and daughter would like to refrain from SNF placement.)   Patient/Family's Response to care:  The patient shares that she is appreciative of the care she has received.    Patient/Family's Understanding of and Emotional Response to Diagnosis, Current Treatment, and Prognosis: The patient appears to be coping well with this complex hospitalization. She is hopefull she will progress to no longer needing the feeding tube and starting her PT. Both patient and daughter have realistic expectations of the patient's post DC needs.  Emotional Assessment Appearance:  Appears stated age Attitude/Demeanor/Rapport:  Other (Patient is appropriate and welcoming of CSW.) Affect (typically observed):  Accepting, Appropriate, Calm, Pleasant Orientation:  Oriented to Self, Oriented to Place, Oriented to  Time, Oriented to Situation Alcohol / Substance use:  Not Applicable Psych involvement (Current and /or in the community):  No (Comment)  Discharge Needs  Concerns to be addressed:  Discharge Planning Concerns Readmission within the last 30 days:  No Current discharge risk:  Physical Impairment Barriers to Discharge:  Continued Medical Work up   Rigoberto Noel, LCSW 10/22/2015, 3:43 PM

## 2015-10-22 NOTE — Progress Notes (Addendum)
Patient had foley cath removed earlier today. Patient is to void and placed on bed pan, unable to void. Bladder scanned and patient has 837cc in bladder, patient has no feeling of discomfort or feeling like she needs to void. K.Kirby NP paged to make aware and question if patient needs in and out cath or foley replaced. [facility] [employer]

## 2015-10-22 NOTE — Progress Notes (Signed)
Orthopedic Tech Progress Note Patient Details:  Cynthia [name] 02-18-1936 [id number]  Ortho Devices Type of Ortho Device: Postop shoe/boot Ortho Device/Splint Location: heel boot Ortho Device/Splint Interventions: Application   [name] 10/22/2015, 7:07 PM

## 2015-10-22 NOTE — Progress Notes (Signed)
UR Completed Shahira Fiske Graves-Bigelow, RN,BSN [phone]  

## 2015-10-22 NOTE — Progress Notes (Signed)
NIF -45, VC 1.6  Good effort

## 2015-10-22 NOTE — Progress Notes (Signed)
Physical Therapy Treatment Patient Details Name: Cynthia [name] MRN: [id number] DOB: [birth year] Today's Date: 10/22/2015    History of Present Illness Cynthia [name] is a 80 y.o. female with metastatic colon cancer, status post right colectomy and chemotherapy 2015. In December patient was found to have liver lesions, biopsy confirmed metastatic colon cancer. She underwent ablation of the liver lesions on the tenth of last month.  Was found to have GBS and was intubated 2/26-10/18/15.    PT Comments    Session focused on sitting edge of bed. The patient was able to provide some assistance when sitting through bilateral UEs. She does fatigue very quickly and supported rest breaks were required. The patient requested returning to bed due to fatigue. PT to continue to follow.   Follow Up Recommendations  CIR     Equipment Recommendations  Wheelchair (measurements PT);Wheelchair cushion (measurements PT)    Recommendations for Other Services Rehab consult;OT consult     Precautions / Restrictions Precautions Precautions: Fall Restrictions Weight Bearing Restrictions: No    Mobility  Bed Mobility Overal bed mobility: Needs Assistance Bed Mobility: Supine to Sit;Sit to Supine     Supine to sit: +2 for physical assistance;Total assist (pt attempting to assist with UEs) Sit to supine: Max assist;+2 for physical assistance (Pt attempting to assist with UEs)   General bed mobility comments: When going from sitting to supine the patient's Lt UE did get internally rotated prior to going supine. This was repositioned and the patient dened any pain or discomfort in the Lt shoulder.   Transfers                    Ambulation/Gait                 Stairs            Wheelchair Mobility    Modified Rankin (Stroke Patients Only)       Balance Overall balance assessment: Needs assistance Sitting-balance support: Bilateral upper extremity  supported Sitting balance-Leahy Scale: Zero Sitting balance - Comments: While sitting the patient was able to provide assistance through bilateral UEs and trunk musculature. Support decreasing briefly to max assist.                             Cognition Arousal/Alertness: Lethargic Behavior During Therapy: WFL for tasks assessed/performed Overall Cognitive Status: Within Functional Limits for tasks assessed                      Exercises      General Comments General comments (skin integrity, edema, etc.): Focus of session was on sitting balance and tolerance. Patient fatiguing quickly during session.       Pertinent Vitals/Pain Pain Assessment: No/denies pain    Home Living                      Prior Function            PT Goals (current goals can now be found in the care plan section) Acute Rehab PT Goals Patient Stated Goal: get some rest PT Goal Formulation: With patient Time For Goal Achievement: 11/02/15 Potential to Achieve Goals: Fair Progress towards PT goals: Progressing toward goals    Frequency  Min 3X/week    PT Plan Current plan remains appropriate    Co-evaluation  End of Session Equipment Utilized During Treatment: Oxygen Activity Tolerance: Patient limited by fatigue Patient left: in bed;with call bell/phone within reach;with bed alarm set;with SCD's reapplied     Time: 1015-1035 PT Time Calculation (min) (ACUTE ONLY): 20 min  Charges:  $Therapeutic Activity: 8-22 mins                    G Codes:      Cassell Clement, PT, CSCS Pager [phone] Office [phone]  10/22/2015, 11:40 AM

## 2015-10-22 NOTE — Progress Notes (Signed)
Speech Language Pathology Treatment: Dysphagia  Patient Details Name: Cynthia [name] MRN: [id number] DOB: 05-11-1936 Today's Date: 10/22/2015 Time: [id number] SLP Time Calculation (min) (ACUTE ONLY): 14 min  Assessment / Plan / Recommendation Clinical Impression  Pt observed to demonstrate improved vocal quality though strength of cough still poor due to neuromuscular weakness. SLP provided oral care followed by trials for ice chips with pt completing effortful swallow independently. She reports completing this exercise with ice chip over the past two days as recommended. SLP provided 2 bites of puree, again with hard effortful swallows though pt did have some throat clearing following trials. She reports this is due to her dislike of the flavor. Plan to f/u with MBS tomorrow for objective determination of PO readiness. Pt in agreement.     HPI        SLP Plan  MBS     Recommendations  Diet recommendations: NPO             General recommendations: Rehab consult Oral Care Recommendations: Oral care BID;Oral care QID Follow up Recommendations: Inpatient Rehab Plan: [place] Virlan Kempker, [place] CCC-SLP [id number]  Lynann Beaver 10/22/2015, 2:49 PM

## 2015-10-22 NOTE — Progress Notes (Signed)
RT in to see pt. for Respiratory mechanics, FVC-1.34(L)/NIF(-)45, awake and alert, RT to monitor.

## 2015-10-22 NOTE — Progress Notes (Signed)
PROGRESS NOTE  Cynthia [name] [id number] DOB: 03-01-1936 DOA: 10/06/2015 PCP: [name], MD Outpatient Specialists:    LOS: 13 days   Brief Narrative: 80 yo female presented with paresthesia in upper/lower extremities. This was associated with gait ataxia. She developed coffee ground emesis and hypotension . Patient was transferred to the ICU and intubated on 2/26.  Assessment & Plan: Active Problems:   Colon cancer metastasized to liver [facility])   Peripheral neuropathy (HCC)   Paresthesia of both hands   Paresthesia of both feet   Cerebral embolism with cerebral infarction   Shock ([place])   Hemoptysis   Transverse myelitis ([place])   Acute respiratory failure (HCC)   Aspiration into airway   Endotracheally intubated   GBS (Guillain-Barre syndrome) (HCC)   GBS - neurology was consulted and have followed patient while hospitalized, she has received 5 day course of IVIG with subsequent improvement in her weakness.  - PT/OT consulted with recs for CIR - admission pending insurance authorization and medical clearance. Not ready today  - work with PT today, d/c rectal tube afterwards. Discussed with RN  Hypoxic respiratory failure - Patient required intubation on PCCM service until 3/7, successfully extubated 3/6 and currently remains on min O2 support - continue NIF - stable today  Confusion / intermittent hallucinations - patient with intermittent hallucinations throughout her hospital stay, related to morphine initially and also to fenanyl - has not used narcotics in 2 days, discontinue fentanyl - decrease gabapentin today - check UA  UTI - UA with strong evidence of infection - start Ceftriaxone - urine cultures pending  HTN - BP currently stable, albeit suboptimal - Home metoprolol on hold as pt remains NPO - Will start IV lopressor as tolerated, plan to resume home PO meds when able to tolerate PO  Acute blood loss anemia secondary to upper gi bleed -  Pt was seen by GI and underwent EGD on 3/1 with findings of tiny to small hiatal hernia and prox linear ulcer questionable from NG tube - Hgb stable at present  HCAP - Completed course of abx while in ICU with Vancomycin and Zosyn, end date 3/4   DVT prophylaxis: Lovenox Code Status: Full Family Communication: no family bedside Disposition Plan: CIR  Consultants:   Neurology   PCCM  Procedures:   EGD 3/1  Cortrak tube  Antimicrobials:  Vancomycin / Zosyn - finished course 3/4  Subjective: - no complaints, denies shortness of breath or chest pain. She is AxOx4 however some slight evidence of confusion  Objective: Filed Vitals:   10/22/15 0400 10/22/15 0755 10/22/15 0800 10/22/15 1049  BP: 147/77 141/82 92/62 137/82  Pulse:  86 81 96  Temp:  97.9 F (36.6 C) 97.9 F (36.6 C)   TempSrc:  Oral Oral   Resp:  24 12 14   Height:      Weight:      SpO2:  98% 99% 98%    Intake/Output Summary (Last 24 hours) at 10/22/15 1127 Last data filed at 10/22/15 0600  Gross per 24 hour  Intake    440 ml  Output   3110 ml  Net  -2670 ml   Filed Weights   10/20/15 0429 10/21/15 0400 10/22/15 0325  Weight: 65.1 kg (143 lb 8.3 oz) 62.823 kg (138 lb 8 oz) 67.858 kg (149 lb 9.6 oz)   Examination: BP 137/82 mmHg  Pulse 96  Temp(Src) 97.9 F (36.6 C) (Oral)  Resp 14  Ht 5\' 2"  (1.575 m)  Wt  67.858 kg (149 lb 9.6 oz)  BMI 27.36 kg/m2  SpO2 98% General exam: NAD Respiratory system: Clear. No increased work of breathing. No wheezing, no crackles Cardiovascular system: regular rate and rhythm, no murmurs, gallops. No JVD. No peripheral edema.  Gastrointestinal system: Abdomen is nondistended, soft and nontender. Normal bowel sounds heard. Central nervous system: AxOx3. Strength 4 out of 5 in the upper extremities, 1 out of 5 in the lower extremities. Extremities: No clubbing/cyanosis Skin: no rashes  Data Reviewed: I have personally reviewed following labs and imaging  studies  CBC:  Recent Labs Lab 10/17/15 0430 10/18/15 0341 10/19/15 0446 10/20/15 0500 10/21/15 0400  WBC 8.1 11.8* 10.2 9.3 10.0  NEUTROABS  --  9.8* 8.7* 7.5  --   HGB 7.6* 8.6* 8.1* 8.9* 9.5*  HCT 24.4* 27.5* 26.2* 28.3* 29.9*  MCV 91.0 91.1 90.7 90.1 89.8  PLT 217 229 208 244 [id number]   Basic Metabolic Panel:  Recent Labs Lab 10/17/15 0430 10/18/15 0341 10/19/15 0446 10/20/15 0500 10/21/15 0400  NA 138 140 141 138 133*  K 3.6 3.8 4.0 4.0 4.3  CL 103 101 104 101 103  CO2 30 31 32 28 22  GLUCOSE 113* 146* 114* 133* 108*  BUN 22* 24* 19 21* 20  CREATININE 0.53 0.54 0.54 0.56 0.46  CALCIUM 7.8* 8.1* 8.1* 8.2* 8.1*  MG  --   --  1.7  --   --   PHOS  --   --  3.5  --   --    GFR: Estimated Creatinine Clearance: 53.2 mL/min (by C-G formula based on Cr of 0.46). Liver Function Tests:  Recent Labs Lab 10/20/15 0500  AST 44*  ALT 29  ALKPHOS 40  BILITOT 0.4  PROT 5.9*  ALBUMIN 1.7*   CBG:  Recent Labs Lab 10/21/15 1618 10/21/15 1935 10/21/15 2350 10/22/15 0436 10/22/15 0935  GLUCAP 106* 113* 116* 107* 114*   Urine analysis:    Component Value Date/Time   COLORURINE YELLOW 10/22/2015 0919   APPEARANCEUR CLOUDY* 10/22/2015 0919   LABSPEC 1.015 10/22/2015 0919   PHURINE 7.0 10/22/2015 0919   GLUCOSEU NEGATIVE 10/22/2015 0919   HGBUR SMALL* 10/22/2015 0919   BILIRUBINUR NEGATIVE 10/22/2015 0919   KETONESUR NEGATIVE 10/22/2015 0919   PROTEINUR NEGATIVE 10/22/2015 0919   UROBILINOGEN 0.2 01/11/2015 1725   NITRITE POSITIVE* 10/22/2015 0919   LEUKOCYTESUR MODERATE* 10/22/2015 0919   Sepsis Labs: Invalid input(s): PROCALCITONIN, LACTICIDVEN  No results found for this or any previous visit (from the past 240 hour(s)).    Radiology Studies: Dg Abd Portable 1v  10/20/2015  CLINICAL DATA:  Encounter for feeding tube placement. EXAM: PORTABLE ABDOMEN - 1 VIEW COMPARISON:  October 14, 2015. FINDINGS: The bowel gas pattern is normal. Distal tip of feeding tube  is seen in expected position of distal stomach or first portion of duodenum. IMPRESSION: Distal tip of feeding tube seen in expected position of distal stomach or first portion of duodenum. Electronically Signed   By: Marijo Conception, M.D.   On: 10/20/2015 14:00     Scheduled Meds: . antiseptic oral rinse  7 mL Mouth Rinse q12n4p  . cefTRIAXone (ROCEPHIN)  IV  1 g Intravenous Q24H  . chlorhexidine  15 mL Mouth Rinse BID  . enoxaparin (LOVENOX) injection  40 mg Subcutaneous Q24H  . feeding supplement (PRO-STAT SUGAR FREE 64)  30 mL Per Tube BID  . gabapentin  300 mg Oral 3 times per day  . Gerhardt's butt cream  Topical QID  . lidocaine  2 patch Transdermal Q24H  . metoprolol  2.5 mg Intravenous 4 times per day  . pantoprazole sodium  40 mg Per Tube Q24H   Continuous Infusions: . dextrose 5 % and 0.45 % NaCl with KCl 20 mEq/L 30 mL/hr at 10/22/15 1046  . feeding supplement (VITAL AF 1.2 CAL) 1,000 mL (10/22/15 0437)     Marzetta Board, MD, PhD [facility] Pager [phone]  If 7PM-7AM, please contact night-coverage [email or web address] Password [email or web address] 10/22/2015, 11:27 AM

## 2015-10-23 ENCOUNTER — Inpatient Hospital Stay (HOSPITAL_COMMUNITY): Payer: Medicare Other

## 2015-10-23 LAB — BASIC METABOLIC PANEL
ANION GAP: 8 (ref 5–15)
BUN: 23 mg/dL — ABNORMAL HIGH (ref 6–20)
CALCIUM: 8.6 mg/dL — AB (ref 8.9–10.3)
CO2: 25 mmol/L (ref 22–32)
Chloride: 99 mmol/L — ABNORMAL LOW (ref 101–111)
Creatinine, Ser: 0.45 mg/dL (ref 0.44–1.00)
Glucose, Bld: 113 mg/dL — ABNORMAL HIGH (ref 65–99)
Potassium: 4.2 mmol/L (ref 3.5–5.1)
Sodium: 132 mmol/L — ABNORMAL LOW (ref 135–145)

## 2015-10-23 LAB — CBC
HCT: 32.7 % — ABNORMAL LOW (ref 36.0–46.0)
Hemoglobin: 10.3 g/dL — ABNORMAL LOW (ref 12.0–15.0)
MCH: 28.2 pg (ref 26.0–34.0)
MCHC: 31.5 g/dL (ref 30.0–36.0)
MCV: 89.6 fL (ref 78.0–100.0)
PLATELETS: 404 10*3/uL — AB (ref 150–400)
RBC: 3.65 MIL/uL — ABNORMAL LOW (ref 3.87–5.11)
RDW: 15.9 % — AB (ref 11.5–15.5)
WBC: 12.5 10*3/uL — AB (ref 4.0–10.5)

## 2015-10-23 LAB — GLUCOSE, CAPILLARY
GLUCOSE-CAPILLARY: 99 mg/dL (ref 65–99)
GLUCOSE-CAPILLARY: 105 mg/dL — AB (ref 65–99)
GLUCOSE-CAPILLARY: 130 mg/dL — AB (ref 65–99)
GLUCOSE-CAPILLARY: 101 mg/dL — AB (ref 65–99)
Glucose-Capillary: 108 mg/dL — ABNORMAL HIGH (ref 65–99)

## 2015-10-23 LAB — PROCALCITONIN: Procalcitonin: <0.1 ng/mL

## 2015-10-23 MED ORDER — ACETAMINOPHEN 325 MG PO TABS
650.0000 mg | ORAL_TABLET | Freq: Four times a day (QID) | ORAL | Status: DC | PRN
Start: 2015-10-23 — End: 2015-10-27
  Administered 2015-10-26: 650 mg via ORAL
  Filled 2015-10-23 (×2): qty 2

## 2015-10-23 MED ORDER — TRAZODONE HCL 50 MG PO TABS
25.0000 mg | ORAL_TABLET | Freq: Every day | ORAL | Status: DC
  Administered 2015-10-23 – 2015-10-24 (×2): 25 mg via ORAL
  Filled 2015-10-23 (×2): qty 1

## 2015-10-23 MED ORDER — ATORVASTATIN CALCIUM 40 MG PO TABS
40.0000 mg | ORAL_TABLET | Freq: Every day | ORAL | Status: DC
  Administered 2015-10-23 – 2015-10-26 (×4): 40 mg via ORAL
  Filled 2015-10-23 (×5): qty 1

## 2015-10-23 MED ORDER — METOPROLOL SUCCINATE ER 25 MG PO TB24
37.5000 mg | ORAL_TABLET | Freq: Every day | ORAL | Status: DC
  Administered 2015-10-24 – 2015-10-26 (×3): 37.5 mg via ORAL
  Filled 2015-10-23 (×5): qty 2

## 2015-10-23 MED ORDER — PANTOPRAZOLE SODIUM 40 MG PO TBEC
40.0000 mg | DELAYED_RELEASE_TABLET | Freq: Every day | ORAL | Status: DC
  Administered 2015-10-23 – 2015-10-27 (×5): 40 mg via ORAL
  Filled 2015-10-23 (×5): qty 1

## 2015-10-23 MED ORDER — LORAZEPAM 0.5 MG PO TABS
0.5000 mg | ORAL_TABLET | Freq: Four times a day (QID) | ORAL | Status: DC | PRN
  Administered 2015-10-23: 0.5 mg via ORAL
  Filled 2015-10-23: qty 1

## 2015-10-23 NOTE — Progress Notes (Signed)
Patient only voided 100 ml on bed pan. Bladder scan of patinet was >927ml. MD made aware. MD verbal order to place Foley Catheter. Will continue to monitor.

## 2015-10-23 NOTE — Progress Notes (Addendum)
Paged M.Lynch NP regarding CBG's for this patient is no longer on tube feeds and has no history of DM. Patient has order for q4 hour CBG's.Order received for ac/hs cbg's . [facility] [employer]

## 2015-10-23 NOTE — Progress Notes (Signed)
PROGRESS NOTE  Cynthia [name] ZOX:[id number] DOB: [birth year] DOA: 10/06/2015 PCP: [name], MD Outpatient Specialists:    LOS: 14 days   Brief Narrative: 80 yo female presented with paresthesia in upper/lower extremities. This was associated with gait ataxia. She developed coffee ground emesis and hypotension . Patient was transferred to the ICU and intubated on 2/26.  Assessment & Plan: Active Problems:   Colon cancer metastasized to liver [facility])   Peripheral neuropathy (HCC)   Paresthesia of both hands   Paresthesia of both feet   Cerebral embolism with cerebral infarction   Shock ([place])   Hemoptysis   Transverse myelitis ([place])   Acute respiratory failure (HCC)   Aspiration into airway   Endotracheally intubated   GBS (Guillain-Barre syndrome) (HCC)   GBS - neurology was consulted and have followed patient while hospitalized, she has received 5 day course of IVIG with subsequent improvement in her weakness.  - PT/OT consulted with recs for CIR - Able to discontinue rectal tube as well as Foley catheter yesterday 3/9 - Required I&O catheterization last night, continue to monitor, may need Foley placement if she continues to retain - admission pending insurance authorization and medical clearance. Not ready today  - Self removed her feeding overnight, however fortunately she passed a swallow eval today  Hypoxic respiratory failure - Patient required intubation on PCCM service until 3/7, successfully extubated 3/6 and currently remains on min O2 support - continue NIF - stable today  Confusion / intermittent hallucinations - patient with intermittent hallucinations throughout her hospital stay, related to morphine initially and also to fenanyl - has not used narcotics recently, fentanyl discontinued on 3/9 - decrease gabapentin 3/9  UTI - UA with strong evidence of infection, patient also symptomatic - start Ceftriaxone, first dose on 3/9 - urine cultures  pending, patient with intermittent hallucinations still, suspect may be related to the urinary tract infection, posterior monitor  HTN - BP currently stable, albeit suboptimal - Patient finally passed a swallow eval today, resume her oral antihypertensive agents  Acute blood loss anemia secondary to upper gi bleed - Pt was seen by GI and underwent EGD on 3/1 with findings of tiny to small hiatal hernia and prox linear ulcer questionable from NG tube - Hgb stable at present - Switch Protonix to by mouth Given the fact that she is now has a diet  HCAP - Completed course of abx while in ICU with Vancomycin and Zosyn, end date 3/4 - Ounce of traction for her UTI  DVT prophylaxis: Lovenox Code Status: Full Family Communication: no family bedside Disposition Plan: CIR When ready, anticipate 2-3 days  Consultants:   Neurology   PCCM  Procedures:   EGD 3/1  Cortrak tube Discontinued 3/9  Antimicrobials:  Vancomycin / Zosyn - finished course 3/4  Subjective: - no complaints, denies shortness of breath or chest pain. She is AxOx4 however some slight evidence of confusion and appears to have had vivid overnight hallucinations. - Self removed her feeding tube overnight  Objective: Filed Vitals:   10/23/15 0440 10/23/15 0441 10/23/15 0446 10/23/15 1219  BP:  152/81    Pulse:      Temp: 97.7 F (36.5 C) 98.3 F (36.8 C)  97.8 F (36.6 C)  TempSrc: Oral Oral  Oral  Resp:  14    Height:      Weight:   60.646 kg (133 lb 11.2 oz)   SpO2:  99%      Intake/Output Summary (Last 24 hours)  at 10/23/15 1241 Last data filed at 10/23/15 0747  Gross per 24 hour  Intake 3022.5 ml  Output   1150 ml  Net 1872.5 ml   Filed Weights   10/21/15 0400 10/22/15 0325 10/23/15 0446  Weight: 62.823 kg (138 lb 8 oz) 67.858 kg (149 lb 9.6 oz) 60.646 kg (133 lb 11.2 oz)   Examination: BP 152/81 mmHg  Pulse 74  Temp(Src) 97.8 F (36.6 C) (Oral)  Resp 14  Ht 5' 2" (1.575 m)  Wt 60.646 kg  (133 lb 11.2 oz)  BMI 24.45 kg/m2  SpO2 99% General exam: NAD Respiratory system: Clear. No increased work of breathing. No wheezing, no crackles Cardiovascular system: regular rate and rhythm, no murmurs, gallops. No JVD. No peripheral edema.  Gastrointestinal system: Abdomen is nondistended, soft and nontender. Normal bowel sounds heard. Central nervous system: AxOx3. Strength 4 out of 5 in the upper extremities, 1 out of 5 in the lower extremities. Extremities: No clubbing/cyanosis Skin: no rashes  Data Reviewed: I have personally reviewed following labs and imaging studies  CBC:  Recent Labs Lab 10/18/15 0341 10/19/15 0446 10/20/15 0500 10/21/15 0400 10/23/15 0357  WBC 11.8* 10.2 9.3 10.0 12.5*  NEUTROABS 9.8* 8.7* 7.5  --   --   HGB 8.6* 8.1* 8.9* 9.5* 10.3*  HCT 27.5* 26.2* 28.3* 29.9* 32.7*  MCV 91.1 90.7 90.1 89.8 89.6  PLT 229 208 244 265 630*   Basic Metabolic Panel:  Recent Labs Lab 10/18/15 0341 10/19/15 0446 10/20/15 0500 10/21/15 0400 10/23/15 0357  NA 140 141 138 133* 132*  K 3.8 4.0 4.0 4.3 4.2  CL 101 104 101 103 99*  CO2 31 32 _0 GLUCOSE 146* 114* 133* 108* 113*  BUN 24* 19 21* 20 23*  CREATININE 0.54 0.54 0.56 0.46 0.45  CALCIUM 8.1* 8.1* 8.2* 8.1* 8.6*  MG  --  1.7  --   --   --   PHOS  --  3.5  --   --   --    GFR: Estimated Creatinine Clearance: 50.5 mL/min (by C-G formula based on Cr of 0.45). Liver Function Tests:  Recent Labs Lab 10/20/15 0500  AST 44*  ALT 29  ALKPHOS 40  BILITOT 0.4  PROT 5.9*  ALBUMIN 1.7*   CBG:  Recent Labs Lab 10/22/15 2003 10/22/15 2342 10/23/15 0533 10/23/15 0753 10/23/15 1125  GLUCAP 103* 108* 99 105* 130*   Urine analysis:    Component Value Date/Time   COLORURINE YELLOW 10/22/2015 0919   APPEARANCEUR CLOUDY* 10/22/2015 0919   LABSPEC 1.015 10/22/2015 0919   PHURINE 7.0 10/22/2015 0919   GLUCOSEU NEGATIVE 10/22/2015 0919   HGBUR SMALL* 10/22/2015 0919   BILIRUBINUR NEGATIVE  10/22/2015 0919   KETONESUR NEGATIVE 10/22/2015 0919   PROTEINUR NEGATIVE 10/22/2015 0919   UROBILINOGEN 0.2 01/11/2015 1725   NITRITE POSITIVE* 10/22/2015 0919   LEUKOCYTESUR MODERATE* 10/22/2015 0919   Sepsis Labs: Invalid input(s): PROCALCITONIN, LACTICIDVEN  No results found for this or any previous visit (from the past 240 hour(s)).    Radiology Studies: Dg Swallowing Func-speech Pathology  10/23/2015  Objective Swallowing Evaluation: Type of Study: MBS-Modified Barium Swallow Study Patient Details Name: REAH JUSTO MRN: [id number] Date of Birth: Jul 25, 1936 Today's Date: 10/23/2015 Time: SLP Start Time (ACUTE ONLY): 1030-SLP Stop Time (ACUTE ONLY): 1100 SLP Time Calculation (min) (ACUTE ONLY): 30 min Past Medical History: Past Medical History Diagnosis Date . Anemia  . Blood transfusion without reported diagnosis jan 2015 . Heart  murmur  . Hypertension    no bp meds  . Liver cyst  . C. difficile colitis 03/05/14, 03/22/14, 04/05/14   recurrent . Stroke ([place]) 12/2014   NUMBNESS ON LEFT SIDE . Cervical cancer ([place]) 1972 . Colon cancer ([place]) JAN 2015 . Colon cancer ([place])  . Cancer ([place])    liver . Complication of anesthesia    slow to awaken in past, DONE WELL RECENTLY . Tinnitus of both ears ALL THE TIME . DVT (deep venous thrombosis) ([place]) 10/24/13   Right leg Past Surgical History: Past Surgical History Procedure Laterality Date . Back surgery  1981   lower lumbar . Tubal ligation  1968 . Laparoscopic right colectomy Right 10/22/2013   Procedure: LAPAROSCOPIC RIGHT COLECTOMY ;  Surgeon: [name], MD;  Location: [facility] ORS;  Service: General;  Laterality: Right; . Salpingoophorectomy  10/22/2013   Procedure: [place] Saa;  Surgeon: [name]. [name], MD;  Location: [facility] ORS;  Service: Gynecology;; . Abdominal hysterectomy     vaginal- partial . Esophagogastroduodenoscopy N/A 10/14/2015   Procedure: ESOPHAGOGASTRODUODENOSCOPY (EGD);  Surgeon: Clarene Essex, MD;  Location: [facility] ENDOSCOPY;   Service: Endoscopy;  Laterality: N/A;  Bedside in ICU HPI: CLORISSA GRUENBERG is an 80 y.o. female patient presented with paresthesia in UE/LE. 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 Pt developed coffee ground emesis and hypotension - acute UGI bleed on 2/24.  Pt was intubated from 10/11/15 until 10/18/15. She has hx of finding of chronic right  basal ganglia CVA in May 2016 (BSE done, pt normal), metastatic colon cancer s/p Rt colectomy.  No Data Recorded Assessment / Plan / Recommendation CHL IP CLINICAL IMPRESSIONS 10/23/2015 Therapy Diagnosis Moderate pharyngeal phase dysphagia Clinical Impression pt continue to present with a moderate oropharyngeal dysphagia characterized by delayed swallow initaition leading to aspiration before the swallow with sensation but insufficient cough. If bolus size is reduced to teaspoon size and pt is instructed to hold the bolus, she can eliminate aspriation. She was also successful with large sips fo thin with a straw with a complete chin tuck. Risk of aspiration is moderate given weak muscles of respiration and occasional confusion. She would benefit from initiating a Respiratory Muscle Strength Training program.  Pt will need full supervision for reminders for a chin tuck. She may initaite a regular diet and thin liquids if these conditions are being met.  Impact on safety and function Moderate aspiration risk   CHL IP TREATMENT RECOMMENDATION 10/23/2015 Treatment Recommendations Therapy as outlined in treatment plan below   Prognosis 10/23/2015 Prognosis for Safe Diet Advancement Good Barriers to Reach Goals -- Barriers/Prognosis Comment -- CHL IP DIET RECOMMENDATION 10/23/2015 SLP Diet Recommendations Regular solids;Thin liquid Liquid Administration via Straw Medication Administration Whole meds with puree Compensations Chin tuck Postural Changes Seated upright at 90 degrees    CHL IP OTHER RECOMMENDATIONS 10/23/2015 Recommended Consults -- Oral Care Recommendations Oral care BID Other Recommendations --   CHL IP FOLLOW UP RECOMMENDATIONS 10/23/2015 Follow up Recommendations Inpatient Rehab   CHL IP FREQUENCY AND DURATION 10/23/2015 Speech Therapy Frequency (ACUTE ONLY) min 2x/week Treatment Duration 2 weeks      CHL IP ORAL PHASE 10/23/2015 Oral Phase WFL Oral - Pudding Teaspoon -- Oral - Pudding Cup -- Oral - Honey Teaspoon -- Oral - Honey Cup -- Oral - Nectar Teaspoon -- Oral - Nectar Cup -- Oral - Nectar Straw -- Oral - Thin  Teaspoon -- Oral - Thin Cup -- Oral - Thin Straw -- Oral - Puree -- Oral - Mech Soft -- Oral - Regular -- Oral - Multi-Consistency -- Oral - Pill -- Oral Phase - Comment --  CHL IP PHARYNGEAL PHASE 10/23/2015 Pharyngeal Phase Impaired Pharyngeal- Pudding Teaspoon -- Pharyngeal -- Pharyngeal- Pudding Cup -- Pharyngeal -- Pharyngeal- Honey Teaspoon -- Pharyngeal -- Pharyngeal- Honey Cup -- Pharyngeal -- Pharyngeal- Nectar Teaspoon Delayed swallow initiation-vallecula Pharyngeal -- Pharyngeal- Nectar Cup Penetration/Aspiration before swallow;Significant aspiration (Amount) Pharyngeal Material enters airway, passes BELOW cords and not ejected out despite cough attempt by patient Pharyngeal- Nectar Straw -- Pharyngeal -- Pharyngeal- Thin Teaspoon -- Pharyngeal -- Pharyngeal- Thin Cup -- Pharyngeal -- Pharyngeal- Thin Straw Delayed swallow initiation-vallecula;Delayed swallow initiation-pyriform sinuses;Compensatory strategies attempted (with notebox) Pharyngeal -- Pharyngeal- Puree Delayed swallow initiation-vallecula Pharyngeal Material does not enter airway Pharyngeal- Mechanical Soft -- Pharyngeal -- Pharyngeal- Regular Delayed swallow initiation-vallecula Pharyngeal -- Pharyngeal- Multi-consistency -- Pharyngeal -- Pharyngeal- Pill Delayed swallow initiation-vallecula Pharyngeal -- Pharyngeal Comment --  No flowsheet data found. No flowsheet data found. Herbie Baltimore,  [place] CCC-SLP [phone] Lynann Beaver 10/23/2015, 11:18 AM              Scheduled Meds: . antiseptic oral rinse  7 mL Mouth Rinse q12n4p  . atorvastatin  40 mg Oral q1800  . cefTRIAXone (ROCEPHIN)  IV  1 g Intravenous Q24H  . chlorhexidine  15 mL Mouth Rinse BID  . enoxaparin (LOVENOX) injection  40 mg Subcutaneous Q24H  . feeding supplement (PRO-STAT SUGAR FREE 64)  30 mL Per Tube BID  . gabapentin  300 mg Oral 3 times per day  . Gerhardt's butt cream   Topical QID  . lidocaine  2 patch Transdermal Q24H  . metoprolol succinate  37.5 mg Oral QHS  . pantoprazole  40 mg Oral Daily   Continuous Infusions: . dextrose 5 % and 0.45 % NaCl with KCl 20 mEq/L 30 mL/hr at 10/22/15 1046  . feeding supplement (VITAL AF 1.2 CAL) 1,000 mL (10/22/15 0437)    Marzetta Board, MD, PhD [facility] Pager [phone]  If 7PM-7AM, please contact night-coverage [email or web address] Password [facility] 10/23/2015, 12:41 PM

## 2015-10-23 NOTE — Progress Notes (Signed)
Noted pt's progress. I have equested for PT and OT to see pt Monday morning so that I can then pursue insurance authorization for a possible inpt rehab admission. [id number]

## 2015-10-23 NOTE — Progress Notes (Addendum)
PT much calmer at this time. NSR 80's to 90's. BP 98 /83  Pt o2 sats 100% on room air. RR 15. Will continue to moitor.

## 2015-10-23 NOTE — Care Management Important Message (Signed)
Important Message  Patient Details  Name: Cynthia [name] MRN: [id number] Date of Birth: July 19, 1936   Medicare Important Message Given:  Yes    Jonh Mcqueary [place] 10/23/2015, 11:40 AM

## 2015-10-23 NOTE — Progress Notes (Signed)
VC: 1.65 L/min    NIF: -30 cm H20

## 2015-10-23 NOTE — Progress Notes (Signed)
MBSS complete. Full report located under chart review in imaging section. Gibril Mastro, MA CCC-SLP [phone]  

## 2015-10-23 NOTE — Progress Notes (Signed)
Patient performed NIF/VC with NIF being -45 cmH2O and VC 1.2L with good effort

## 2015-10-23 NOTE — Progress Notes (Signed)
Patient noted to have NG tube pulled out. K.Kirby NP was paged to notify. Pt has no coughing or VSS changes noted. Lungs sounds clear, diminished. Will continue to monitor. [facility] [employer]

## 2015-10-23 NOTE — Progress Notes (Signed)
Paged MD about pt's increase in anxiety and and hallucinations. Gave pt 0.5mg  PO ativan per MD order. Will continue to monitor.

## 2015-10-23 NOTE — Progress Notes (Signed)
Called by RN to evaluate patient for concerns for hypoxia, unequal pupils and increased spastic movements. On my evaluation, she appears confused although very alert. She answers some orientation questions appropriately however states random sentences occasionally. She is 98-100 on room air currently. Pupils appear equal and reactive to light. She has increasing choreic movements that were present earlier on although not as high in amplitude. Will obtain CT head to rule out acute intracranial pathology although clinically unlikely. Will discuss with neurology in am.  Cynthia [name] M. Cruzita Lederer, MD [facility] [phone]

## 2015-10-23 NOTE — Progress Notes (Signed)
In and out cath per order with 700cc amber urine obtained, patient tolerated well. [employer]

## 2015-10-23 NOTE — Progress Notes (Signed)
Paged MD about increased spastic movements, continued hallucinations. PT Sinus Tach 114, BP 110/70. Awaiting new orders. Will continue to monitor.

## 2015-10-24 ENCOUNTER — Inpatient Hospital Stay (HOSPITAL_COMMUNITY): Payer: Medicare Other

## 2015-10-24 LAB — CBC
HCT: 31.8 % — ABNORMAL LOW (ref 36.0–46.0)
Hemoglobin: 10 g/dL — ABNORMAL LOW (ref 12.0–15.0)
MCH: 28.3 pg (ref 26.0–34.0)
MCHC: 31.4 g/dL (ref 30.0–36.0)
MCV: 90.1 fL (ref 78.0–100.0)
PLATELETS: 459 10*3/uL — AB (ref 150–400)
RBC: 3.53 MIL/uL — AB (ref 3.87–5.11)
RDW: 16.4 % — AB (ref 11.5–15.5)
WBC: 10.7 10*3/uL — ABNORMAL HIGH (ref 4.0–10.5)

## 2015-10-24 LAB — GLUCOSE, CAPILLARY
GLUCOSE-CAPILLARY: 110 mg/dL — AB (ref 65–99)
Glucose-Capillary: 109 mg/dL — ABNORMAL HIGH (ref 65–99)
Glucose-Capillary: 103 mg/dL — ABNORMAL HIGH (ref 65–99)
Glucose-Capillary: 94 mg/dL (ref 65–99)

## 2015-10-24 LAB — URINE CULTURE

## 2015-10-24 LAB — BASIC METABOLIC PANEL
Anion gap: 9 (ref 5–15)
BUN: 22 mg/dL — AB (ref 6–20)
CALCIUM: 8.7 mg/dL — AB (ref 8.9–10.3)
CO2: 25 mmol/L (ref 22–32)
CREATININE: 0.51 mg/dL (ref 0.44–1.00)
Chloride: 101 mmol/L (ref 101–111)
GFR calc non Af Amer: >60 mL/min (ref >60)
Glucose, Bld: 118 mg/dL — ABNORMAL HIGH (ref 65–99)
Potassium: 4.2 mmol/L (ref 3.5–5.1)
SODIUM: 135 mmol/L (ref 135–145)

## 2015-10-24 LAB — PROCALCITONIN: Procalcitonin: <0.1 ng/mL

## 2015-10-24 MED ORDER — CYANOCOBALAMIN 1000 MCG/ML IJ SOLN
1000.0000 ug | Freq: Once | INTRAMUSCULAR | Status: AC
Start: 2015-10-24 — End: 2015-10-24
  Administered 2015-10-24: 1000 ug via INTRAMUSCULAR
  Filled 2015-10-24: qty 1

## 2015-10-24 NOTE — Progress Notes (Signed)
Subjective: Patient has had intermittent confusion and situations. She has been confabulating fairly frequently. No increase in extremity weakness noted. CT scan of the head was obtained yesterday which showed no acute intracranial abnormality. Laboratory studies were unremarkable.  Objective: Current vital signs: BP 140/77 mmHg  Pulse 86  Temp(Src) 98.1 F (36.7 C) (Oral)  Resp 17  Ht 5\' 2"  (1.575 m)  Wt 61.236 kg (135 lb)  BMI 24.69 kg/m2  SpO2 100%  Neurologic Exam: Patient was alert and in no acute distress. She showed no signs of active hallucinations during this evaluation. There was no confabulation, as well. She was well-oriented to time as well as place. Extraocular movements were full and conjugate. No facial weakness was noted. Speech was normal. Neck flexor strength was 5-/5, and extensors were normal. She was able to hold her arms extended against gravity, with moderate upper extremity weakness proximally and distally in the: Lower extremities were markedly weak with only 1 over 5 strength proximally and distally. Deep tendon reflexes remain absent throughout  Medications: I have reviewed the patient's current medications.  Assessment/Plan: 80 year old lady with acute Guillain-Barr syndrome, status post treatment with IVIG, with slow improvement extremity strength.  Recommendations: 1 EEG, routine adult study to rule out encephalopathic state as well as to rule out possible focal seizure activity. 2. No changes in current management, with anticipation of inpatient rehabilitation following acute hospital stay.  C.R. Nicole Kindred, MD [facility] [phone]  10/24/2015  9:54 AM

## 2015-10-24 NOTE — Procedures (Signed)
VC  1.3l , NIF - 60 good pt effort.

## 2015-10-24 NOTE — Progress Notes (Signed)
SLP Cancellation Note  Patient Details Name: Cynthia [name] MRN: [id number] DOB: [birth year]   Cancelled treatment:       Reason Eval/Treat Not Completed: Fatigue/lethargy limiting ability to participate. Pt finally comfortable and resting. Discussed precautions with RN and daughter. Will f/u when able.  Herbie Baltimore, [place] CCC-SLP [phone]  .   [name], Katherene Ponto 10/24/2015, 1:13 PM

## 2015-10-24 NOTE — Progress Notes (Signed)
EEG Completed; Results Pending  

## 2015-10-24 NOTE — Progress Notes (Signed)
VC 1.6L,  NIF -40cmH20  Good effort.  Very cooperative

## 2015-10-24 NOTE — Progress Notes (Signed)
PROGRESS NOTE  Cynthia [name] TGP:[id number] DOB: 06-26-1936 DOA: 10/06/2015 PCP: [name], MD Outpatient Specialists:    LOS: 15 days   Brief Narrative: 80 yo female presented with paresthesia in upper/lower extremities. This was associated with gait ataxia. She developed coffee ground emesis and hypotension . Patient was transferred to the ICU and intubated on 2/26.  Assessment & Plan: Active Problems:   Colon cancer metastasized to liver [facility])   Peripheral neuropathy (HCC)   Paresthesia of both hands   Paresthesia of both feet   Cerebral embolism with cerebral infarction   Shock ([place])   Hemoptysis   Transverse myelitis ([place])   Acute respiratory failure (HCC)   Aspiration into airway   Endotracheally intubated   GBS (Guillain-Barre syndrome) (HCC)   GBS - neurology was consulted and have followed patient while hospitalized, she has received 5 day course of IVIG with subsequent improvement in her weakness.  - PT/OT consulted with recs for CIR - Able to discontinue rectal tube as well as Foley catheter yesterday 3/9, however Foley catheter needed to be placed back again on 3/10 given ongoing urinary retention - admission pending insurance authorization and medical clearance. Not ready given worsening of her mental status - Self removed her feeding overnight 3/9, however fortunately she passed a swallow eval 3/10  Hypoxic respiratory failure - Patient required intubation on PCCM service until 3/7, successfully extubated 3/6 and currently remains on min O2 support - continue NIF - stable today  Confusion / intermittent hallucinations / delirium - patient with intermittent hallucinations throughout her hospital stay, related to morphine initially and also to fenanyl - has not used narcotics recently, fentanyl discontinued on 3/9 - decreased gabapentin 3/9, discontinued altogether on 3/10 - Avoid benzodiazepines as patient became more agitated yesterday after  benzodiazepines use - Progressively getting worse, I reconsulted neurology, discussed with Dr. Nicole Kindred today, appreciate input. We'll obtain an EEG. CT scan of the head unremarkable. She was B12 deficient last year, received by mouth supplementation, her B12 looks somewhat improved, however I will give her a B12 shot and obtain an MMA level.  UTI - UA with strong evidence of infection, patient also symptomatic - start Ceftriaxone, first dose on 3/9 - urine cultures with Citrobacter, sensitive to ceftriaxone, continue for now, plan for a full 7-10 days  HTN - BP currently stable, albeit suboptimal - Patient finally passed a swallow eval today, resume her oral antihypertensive agents  Acute blood loss anemia secondary to upper gi bleed - Pt was seen by GI and underwent EGD on 3/1 with findings of tiny to small hiatal hernia and prox linear ulcer questionable from NG tube - Hgb stable at present  HCAP - Completed course of abx while in ICU with Vancomycin and Zosyn, end date 3/4   DVT prophylaxis: Lovenox Code Status: Full Family Communication: no family bedside Disposition Plan: CIR When ready, anticipate 2-3 days  Consultants:   Neurology   PCCM  Procedures:   EGD 3/1  Cortrak tube Discontinued 3/9  Antimicrobials:  Vancomycin / Zosyn - finished course 3/4  Ceftriaxone 3/9 >>  Subjective: - very confused this morning, asking me "when we'll might train ride stop?". Denies any chest pain, shortness of breath.  Objective: Filed Vitals:   10/24/15 0010 10/24/15 0416 10/24/15 0757 10/24/15 1100  BP: 127/90 126/79 140/77 120/98  Pulse:   86 73  Temp: 98.1 F (36.7 C) 97.7 F (36.5 C) 98.1 F (36.7 C)   TempSrc: Oral Oral Oral  Resp: 20 24 17 17   Height:      Weight:  61.236 kg (135 lb)    SpO2:  100% 100% 100%    Intake/Output Summary (Last 24 hours) at 10/24/15 1231 Last data filed at 10/24/15 1009  Gross per 24 hour  Intake  886.5 ml  Output   2300 ml    Net -1413.5 ml   Filed Weights   10/22/15 0325 10/23/15 0446 10/24/15 0416  Weight: 67.858 kg (149 lb 9.6 oz) 60.646 kg (133 lb 11.2 oz) 61.236 kg (135 lb)   Examination: BP 120/98 mmHg  Pulse 73  Temp(Src) 98.1 F (36.7 C) (Oral)  Resp 17  Ht 5' 2"  (1.575 m)  Wt 61.236 kg (135 lb)  BMI 24.69 kg/m2  SpO2 100% General exam: NAD, appears agitated and confused this morning Respiratory system: Clear. No increased work of breathing. No wheezing, no crackles Cardiovascular system: regular rate and rhythm, no murmurs, gallops. No JVD. No peripheral edema.  Gastrointestinal system: Abdomen is nondistended, soft and nontender. Normal bowel sounds heard. Central nervous system: AxOx1-2. Strength 4 out of 5 in the upper extremities, 1 out of 5 in the lower extremities. Extremities: No clubbing/cyanosis Skin: no rashes  Data Reviewed: I have personally reviewed following labs and imaging studies  CBC:  Recent Labs Lab 10/18/15 0341 10/19/15 0446 10/20/15 0500 10/21/15 0400 10/23/15 0357 10/24/15 0547  WBC 11.8* 10.2 9.3 10.0 12.5* 10.7*  NEUTROABS 9.8* 8.7* 7.5  --   --   --   HGB 8.6* 8.1* 8.9* 9.5* 10.3* 10.0*  HCT 27.5* 26.2* 28.3* 29.9* 32.7* 31.8*  MCV 91.1 90.7 90.1 89.8 89.6 90.1  PLT 229 208 244 265 404* 889*   Basic Metabolic Panel:  Recent Labs Lab 10/19/15 0446 10/20/15 0500 10/21/15 0400 10/23/15 0357 10/24/15 0547  NA 141 138 133* 132* 135  K 4.0 4.0 4.3 4.2 4.2  CL 104 101 103 99* 101  CO2 32 28 22 25 25   GLUCOSE 114* 133* 108* 113* 118*  BUN 19 21* 20 23* 22*  CREATININE 0.54 0.56 0.46 0.45 0.51  CALCIUM 8.1* 8.2* 8.1* 8.6* 8.7*  MG 1.7  --   --   --   --   PHOS 3.5  --   --   --   --    GFR: Estimated Creatinine Clearance: 50.7 mL/min (by C-G formula based on Cr of 0.51). Liver Function Tests:  Recent Labs Lab 10/20/15 0500  AST 44*  ALT 29  ALKPHOS 40  BILITOT 0.4  PROT 5.9*  ALBUMIN 1.7*   CBG:  Recent Labs Lab 10/23/15 1125  10/23/15 1644 10/23/15 2009 10/24/15 0800 10/24/15 1205  GLUCAP 130* 108* 101* 110* 109*   Urine analysis:    Component Value Date/Time   COLORURINE YELLOW 10/22/2015 0919   APPEARANCEUR CLOUDY* 10/22/2015 0919   LABSPEC 1.015 10/22/2015 0919   PHURINE 7.0 10/22/2015 0919   GLUCOSEU NEGATIVE 10/22/2015 0919   HGBUR SMALL* 10/22/2015 0919   BILIRUBINUR NEGATIVE 10/22/2015 0919   KETONESUR NEGATIVE 10/22/2015 0919   PROTEINUR NEGATIVE 10/22/2015 0919   UROBILINOGEN 0.2 01/11/2015 1725   NITRITE POSITIVE* 10/22/2015 0919   LEUKOCYTESUR MODERATE* 10/22/2015 0919   Sepsis Labs: Invalid input(s): PROCALCITONIN, LACTICIDVEN  Recent Results (from the past 240 hour(s))  Culture, Urine     Status: None   Collection Time: 10/22/15  9:19 AM  Result Value Ref Range Status   Specimen Description URINE, RANDOM  Final   Special Requests NONE  Final   Culture >=100,000 COLONIES/mL CITROBACTER KOSERI  Final   Report Status 10/24/2015 FINAL  Final   Organism ID, Bacteria CITROBACTER KOSERI  Final      Susceptibility   Citrobacter koseri - MIC*    CEFAZOLIN <=4 SENSITIVE Sensitive     CEFTRIAXONE <=1 SENSITIVE Sensitive     CIPROFLOXACIN <=0.25 SENSITIVE Sensitive     GENTAMICIN <=1 SENSITIVE Sensitive     IMIPENEM <=0.25 SENSITIVE Sensitive     NITROFURANTOIN 32 SENSITIVE Sensitive     TRIMETH/SULFA <=20 SENSITIVE Sensitive     PIP/TAZO 16 SENSITIVE Sensitive     * >=100,000 COLONIES/mL CITROBACTER KOSERI      Radiology Studies: Ct Head Wo Contrast  10/23/2015  CLINICAL DATA:  Altered mental status. EXAM: CT HEAD WITHOUT CONTRAST TECHNIQUE: Contiguous axial images were obtained from the base of the skull through the vertex without intravenous contrast. COMPARISON:  10/06/2015 FINDINGS: There is no evidence for acute hemorrhage, hydrocephalus, mass lesion, or abnormal extra-axial fluid collection. No definite CT evidence for acute infarction. Diffuse loss of parenchymal volume is  consistent with atrophy. Patchy low attenuation in the deep hemispheric and periventricular white matter is nonspecific, but likely reflects chronic microvascular ischemic demyelination. The visualized paranasal sinuses and mastoid air cells are clear. IMPRESSION: Stable exam without acute intracranial abnormality. Atrophy with chronic small vessel white matter ischemic disease. Electronically Signed   By: Misty Stanley M.D.   On: 10/23/2015 21:13   Dg Swallowing Func-speech Pathology  10/23/2015  Objective Swallowing Evaluation: Type of Study: MBS-Modified Barium Swallow Study Patient Details Name: JULLIANNA GABOR MRN: [id number] Date of Birth: [birth year] Today's Date: 10/23/2015 Time: SLP Start Time (ACUTE ONLY): 1030-SLP Stop Time (ACUTE ONLY): 1100 SLP Time Calculation (min) (ACUTE ONLY): 30 min Past Medical History: Past Medical History Diagnosis Date . Anemia  . Blood transfusion without reported diagnosis jan 2015 . Heart murmur  . Hypertension    no bp meds  . Liver cyst  . C. difficile colitis 03/05/14, 03/22/14, 04/05/14   recurrent . Stroke ([place]) 12/2014   NUMBNESS ON LEFT SIDE . Cervical cancer ([place]) 1972 . Colon cancer ([place]) JAN 2015 . Colon cancer ([place])  . Cancer ([place])    liver . Complication of anesthesia    slow to awaken in past, DONE WELL RECENTLY . Tinnitus of both ears ALL THE TIME . DVT (deep venous thrombosis) ([place]) 10/24/13   Right leg Past Surgical History: Past Surgical History Procedure Laterality Date . Back surgery  1981   lower lumbar . Tubal ligation  1968 . Laparoscopic right colectomy Right 10/22/2013   Procedure: LAPAROSCOPIC RIGHT COLECTOMY ;  Surgeon: [name], MD;  Location: [facility] ORS;  Service: General;  Laterality: Right; . Salpingoophorectomy  10/22/2013   Procedure: [place] Saa;  Surgeon: [name]. [name], MD;  Location: [facility] ORS;  Service: Gynecology;; . Abdominal hysterectomy     vaginal- partial . Esophagogastroduodenoscopy N/A 10/14/2015   Procedure:  ESOPHAGOGASTRODUODENOSCOPY (EGD);  Surgeon: Clarene Essex, MD;  Location: [facility] ENDOSCOPY;  Service: Endoscopy;  Laterality: N/A;  Bedside in ICU HPI: Cynthia [name] is an 80 y.o. female patient presented with paresthesia in UE/LE. 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 Pt developed coffee ground emesis and hypotension - acute UGI bleed on 2/24.  Pt was intubated from 10/11/15 until 10/18/15. She has hx of finding of chronic right  basal ganglia CVA  in May 2016 (BSE done, pt normal), metastatic colon cancer s/p Rt colectomy.  No Data Recorded Assessment / Plan / Recommendation CHL IP CLINICAL IMPRESSIONS 10/23/2015 Therapy Diagnosis Moderate pharyngeal phase dysphagia Clinical Impression pt continue to present with a moderate oropharyngeal dysphagia characterized by delayed swallow initaition leading to aspiration before the swallow with sensation but insufficient cough. If bolus size is reduced to teaspoon size and pt is instructed to hold the bolus, she can eliminate aspriation. She was also successful with large sips fo thin with a straw with a complete chin tuck. Risk of aspiration is moderate given weak muscles of respiration and occasional confusion. She would benefit from initiating a Respiratory Muscle Strength Training program.  Pt will need full supervision for reminders for a chin tuck. She may initaite a regular diet and thin liquids if these conditions are being met.  Impact on safety and function Moderate aspiration risk   CHL IP TREATMENT RECOMMENDATION 10/23/2015 Treatment Recommendations Therapy as outlined in treatment plan below   Prognosis 10/23/2015 Prognosis for Safe Diet Advancement Good Barriers to Reach Goals -- Barriers/Prognosis Comment -- CHL IP DIET RECOMMENDATION 10/23/2015 SLP Diet Recommendations Regular solids;Thin liquid Liquid Administration via Straw Medication Administration Whole  meds with puree Compensations Chin tuck Postural Changes Seated upright at 90 degrees   CHL IP OTHER RECOMMENDATIONS 10/23/2015 Recommended Consults -- Oral Care Recommendations Oral care BID Other Recommendations --   CHL IP FOLLOW UP RECOMMENDATIONS 10/23/2015 Follow up Recommendations Inpatient Rehab   CHL IP FREQUENCY AND DURATION 10/23/2015 Speech Therapy Frequency (ACUTE ONLY) min 2x/week Treatment Duration 2 weeks      CHL IP ORAL PHASE 10/23/2015 Oral Phase WFL Oral - Pudding Teaspoon -- Oral - Pudding Cup -- Oral - Honey Teaspoon -- Oral - Honey Cup -- Oral - Nectar Teaspoon -- Oral - Nectar Cup -- Oral - Nectar Straw -- Oral - Thin Teaspoon -- Oral - Thin Cup -- Oral - Thin Straw -- Oral - Puree -- Oral - Mech Soft -- Oral - Regular -- Oral - Multi-Consistency -- Oral - Pill -- Oral Phase - Comment --  CHL IP PHARYNGEAL PHASE 10/23/2015 Pharyngeal Phase Impaired Pharyngeal- Pudding Teaspoon -- Pharyngeal -- Pharyngeal- Pudding Cup -- Pharyngeal -- Pharyngeal- Honey Teaspoon -- Pharyngeal -- Pharyngeal- Honey Cup -- Pharyngeal -- Pharyngeal- Nectar Teaspoon Delayed swallow initiation-vallecula Pharyngeal -- Pharyngeal- Nectar Cup Penetration/Aspiration before swallow;Significant aspiration (Amount) Pharyngeal Material enters airway, passes BELOW cords and not ejected out despite cough attempt by patient Pharyngeal- Nectar Straw -- Pharyngeal -- Pharyngeal- Thin Teaspoon -- Pharyngeal -- Pharyngeal- Thin Cup -- Pharyngeal -- Pharyngeal- Thin Straw Delayed swallow initiation-vallecula;Delayed swallow initiation-pyriform sinuses;Compensatory strategies attempted (with notebox) Pharyngeal -- Pharyngeal- Puree Delayed swallow initiation-vallecula Pharyngeal Material does not enter airway Pharyngeal- Mechanical Soft -- Pharyngeal -- Pharyngeal- Regular Delayed swallow initiation-vallecula Pharyngeal -- Pharyngeal- Multi-consistency -- Pharyngeal -- Pharyngeal- Pill Delayed swallow initiation-vallecula Pharyngeal --  Pharyngeal Comment --  No flowsheet data found. No flowsheet data found. Herbie [place], [place] CCC-SLP [phone] Lynann Beaver 10/23/2015, 11:18 AM              Scheduled Meds: . antiseptic oral rinse  7 mL Mouth Rinse q12n4p  . atorvastatin  40 mg Oral q1800  . cefTRIAXone (ROCEPHIN)  IV  1 g Intravenous Q24H  . chlorhexidine  15 mL Mouth Rinse BID  . cyanocobalamin  1,000 mcg Intramuscular Once  . enoxaparin (LOVENOX) injection  40 mg Subcutaneous Q24H  . feeding supplement (PRO-STAT SUGAR FREE 64)  30 mL Per Tube BID  . Gerhardt's butt cream   Topical QID  . lidocaine  2 patch Transdermal Q24H  . metoprolol succinate  37.5 mg Oral QHS  . pantoprazole  40 mg Oral Daily  . traZODone  25 mg Oral QHS   Continuous Infusions: . dextrose 5 % and 0.45 % NaCl with KCl 20 mEq/L 30 mL/hr (10/24/15 0420)  . feeding supplement (VITAL AF 1.2 CAL) 1,000 mL (10/22/15 0437)   Time spent: 35 minutes, > 50% involved in long discussion with family bedside regarding mental status changes  Marzetta Board, MD, PhD [facility] Pager [phone]  If 7PM-7AM, please contact night-coverage [email or web address] Password [facility] 10/24/2015, 12:31 PM

## 2015-10-24 NOTE — Progress Notes (Addendum)
Walden Field NP paged regarding patient's b/p 92/77 and toprol that was ordered today. Returned call from her to hold toprol this dose. Cynthia [name]

## 2015-10-25 DIAGNOSIS — R410 Disorientation, unspecified: Secondary | ICD-10-CM

## 2015-10-25 LAB — BLOOD GAS, ARTERIAL
Acid-Base Excess: 0.4 mmol/L (ref 0.0–2.0)
BICARBONATE: 24 meq/L (ref 20.0–24.0)
Drawn by: 449561
FIO2: 0.21
O2 Saturation: 95 %
PATIENT TEMPERATURE: 98.6
PH ART: 7.446 (ref 7.350–7.450)
PO2 ART: 74.8 mmHg — AB (ref 80.0–100.0)
TCO2: 25.1 mmol/L (ref 0–100)
pCO2 arterial: 35.4 mmHg (ref 35.0–45.0)

## 2015-10-25 LAB — COMPREHENSIVE METABOLIC PANEL
ALK PHOS: 46 U/L (ref 38–126)
ALT: 33 U/L (ref 14–54)
ANION GAP: 10 (ref 5–15)
AST: 32 U/L (ref 15–41)
Albumin: 2.2 g/dL — ABNORMAL LOW (ref 3.5–5.0)
BILIRUBIN TOTAL: 1 mg/dL (ref 0.3–1.2)
BUN: 16 mg/dL (ref 6–20)
CALCIUM: 8.7 mg/dL — AB (ref 8.9–10.3)
CO2: 25 mmol/L (ref 22–32)
Chloride: 99 mmol/L — ABNORMAL LOW (ref 101–111)
Creatinine, Ser: 0.49 mg/dL (ref 0.44–1.00)
GFR calc Af Amer: >60 mL/min (ref >60)
GFR calc non Af Amer: >60 mL/min (ref >60)
GLUCOSE: 106 mg/dL — AB (ref 65–99)
Potassium: 4.2 mmol/L (ref 3.5–5.1)
Sodium: 134 mmol/L — ABNORMAL LOW (ref 135–145)
TOTAL PROTEIN: 6.6 g/dL (ref 6.5–8.1)

## 2015-10-25 LAB — CBC
HCT: 32.9 % — ABNORMAL LOW (ref 36.0–46.0)
Hemoglobin: 10.3 g/dL — ABNORMAL LOW (ref 12.0–15.0)
MCH: 28.2 pg (ref 26.0–34.0)
MCHC: 31.3 g/dL (ref 30.0–36.0)
MCV: 90.1 fL (ref 78.0–100.0)
Platelets: 455 10*3/uL — ABNORMAL HIGH (ref 150–400)
RBC: 3.65 MIL/uL — ABNORMAL LOW (ref 3.87–5.11)
RDW: 16.1 % — AB (ref 11.5–15.5)
WBC: 7.7 10*3/uL (ref 4.0–10.5)

## 2015-10-25 LAB — GLUCOSE, CAPILLARY
GLUCOSE-CAPILLARY: 107 mg/dL — AB (ref 65–99)
GLUCOSE-CAPILLARY: 110 mg/dL — AB (ref 65–99)
Glucose-Capillary: 120 mg/dL — ABNORMAL HIGH (ref 65–99)
Glucose-Capillary: 116 mg/dL — ABNORMAL HIGH (ref 65–99)

## 2015-10-25 LAB — PROCALCITONIN

## 2015-10-25 LAB — TSH: TSH: 2.448 u[IU]/mL (ref 0.350–4.500)

## 2015-10-25 MED ORDER — QUETIAPINE FUMARATE 25 MG PO TABS
12.5000 mg | ORAL_TABLET | Freq: Every day | ORAL | Status: DC
  Administered 2015-10-25 – 2015-10-26 (×2): 12.5 mg via ORAL
  Filled 2015-10-25 (×2): qty 1

## 2015-10-25 NOTE — Progress Notes (Signed)
[facility] notified of VC and NIF findings, as well as patient presentation.  ABG collected per order.

## 2015-10-25 NOTE — Progress Notes (Signed)
Unable to do NIF & VC at this time due to pt's mental status.

## 2015-10-25 NOTE — Progress Notes (Signed)
PROGRESS NOTE  Cynthia [name] [id number] DOB: [birth year] DOA: 10/06/2015 PCP: [name], MD Outpatient Specialists:    LOS: 16 days   Brief Narrative: 80 yo female presented with paresthesia in upper/lower extremities. This was associated with gait ataxia. She developed coffee ground emesis and hypotension . Patient was transferred to the ICU and intubated on 2/26.  Assessment & Plan: Active Problems:   Colon cancer metastasized to liver [facility])   Peripheral neuropathy (HCC)   Paresthesia of both hands   Paresthesia of both feet   Cerebral embolism with cerebral infarction   Shock ([place])   Hemoptysis   Transverse myelitis ([place])   Acute respiratory failure (HCC)   Aspiration into airway   Endotracheally intubated   GBS (Guillain-Barre syndrome) (HCC)   GBS - neurology was consulted and have followed patient while hospitalized, she has received 5 day course of IVIG with subsequent improvement in her weakness.  - PT/OT consulted with recs for CIR - Able to discontinue rectal tube as well as Foley catheter yesterday 3/9, however Foley catheter needed to be placed back again on 3/10 given ongoing urinary retention - admission pending insurance authorization and medical clearance. Not ready given worsening of her mental status - Self removed her feeding overnight 3/9, however fortunately she passed a swallow eval 3/10  Hypoxic respiratory failure - Patient required intubation on PCCM service until 3/7, successfully extubated 3/6 and currently remains on min O2 support - continue NIF - stable today, on room air  Confusion / intermittent hallucinations / delirium - patient with intermittent hallucinations throughout her hospital stay, related to morphine initially and also to fenanyl - has not used narcotics recently, fentanyl discontinued on 3/9 - decreased gabapentin 3/9, discontinued altogether on 3/10 - Avoid benzodiazepines as patient became more agitated  yesterday after benzodiazepines use - Progressively getting worse, I reconsulted neurology, discussed with Dr. Nicole Kindred, appreciate input. EEG non specific. CT scan of the head unremarkable. She was B12 deficient last year, received by mouth supplementation, her B12 looks somewhat improved, however got another B12 shot on 3/11  - MMA level pending - consulted psychiatry today to help with diagnosis and treatment, appreciate input  UTI - UA with strong evidence of infection, patient also symptomatic - start Ceftriaxone, first dose on 3/9 - urine cultures with Citrobacter, sensitive to ceftriaxone, continue for now, plan for a full 7-10 days  HTN - BP currently stable, albeit suboptimal - Patient finally passed a swallow eval today, resume her oral antihypertensive agents  Acute blood loss anemia secondary to upper gi bleed - Pt was seen by GI and underwent EGD on 3/1 with findings of tiny to small hiatal hernia and prox linear ulcer questionable from NG tube - Hgb stable at present  HCAP - Completed course of abx while in ICU with Vancomycin and Zosyn, end date 3/4   DVT prophylaxis: Lovenox Code Status: Full Family Communication: d/w daughter bedside Disposition Plan: CIR When ready, anticipate 2-3 days  Consultants:   Neurology   PCCM  Psychiatry   Procedures:   EGD 3/1  Cortrak tube Discontinued 3/9  Antimicrobials:  Vancomycin / Zosyn - finished course 3/4  Ceftriaxone 3/9 >>  Subjective: - continues to be confused, thinks she is in an airplane in [place], [place]  Objective: Filed Vitals:   10/24/15 2150 10/25/15 0020 10/25/15 0427 10/25/15 0745  BP: 129/85 144/57 138/93 122/69  Pulse: 75 73  87  Temp: 97.4 F (36.3 C) 97.7 F (36.5 C) 97.4 F (  36.3 C) 98.3 F (36.8 C)  TempSrc:   Oral Oral  Resp: 22 15 17 18   Height:      Weight:   63.322 kg (139 lb 9.6 oz)   SpO2: 100% 100% 99% 97%    Intake/Output Summary (Last 24 hours) at 10/25/15 1210 Last  data filed at 10/25/15 0900  Gross per 24 hour  Intake   1490 ml  Output      0 ml  Net   1490 ml   Filed Weights   10/23/15 0446 10/24/15 0416 10/25/15 0427  Weight: 60.646 kg (133 lb 11.2 oz) 61.236 kg (135 lb) 63.322 kg (139 lb 9.6 oz)   Examination: BP 122/69 mmHg  Pulse 87  Temp(Src) 98.3 F (36.8 C) (Oral)  Resp 18  Ht 5\' 2"  (1.575 m)  Wt 63.322 kg (139 lb 9.6 oz)  BMI 25.53 kg/m2  SpO2 97% General exam: NAD, appears agitated and confused this morning Respiratory system: Clear. No increased work of breathing. No wheezing, no crackles Cardiovascular system: regular rate and rhythm, no murmurs, gallops. No JVD. No peripheral edema.  Gastrointestinal system: Abdomen is nondistended, soft and nontender. Normal bowel sounds heard. Central nervous system: AxOx1-2. Strength 4 out of 5 in the upper extremities, 1 out of 5 in the lower extremities. Extremities: No clubbing/cyanosis Skin: no rashes  Data Reviewed: I have personally reviewed following labs and imaging studies  CBC:  Recent Labs Lab 10/19/15 0446 10/20/15 0500 10/21/15 0400 10/23/15 0357 10/24/15 0547 10/25/15 0801  WBC 10.2 9.3 10.0 12.5* 10.7* 7.7  NEUTROABS 8.7* 7.5  --   --   --   --   HGB 8.1* 8.9* 9.5* 10.3* 10.0* 10.3*  HCT 26.2* 28.3* 29.9* 32.7* 31.8* 32.9*  MCV 90.7 90.1 89.8 89.6 90.1 90.1  PLT 208 244 265 404* 459* Q000111Q*   Basic Metabolic Panel:  Recent Labs Lab 10/19/15 0446 10/20/15 0500 10/21/15 0400 10/23/15 0357 10/24/15 0547 10/25/15 0801  NA 141 138 133* 132* 135 134*  K 4.0 4.0 4.3 4.2 4.2 4.2  CL 104 101 103 99* 101 99*  CO2 32 28 22 25 25 25   GLUCOSE 114* 133* 108* 113* 118* 106*  BUN 19 21* 20 23* 22* 16  CREATININE 0.54 0.56 0.46 0.45 0.51 0.49  CALCIUM 8.1* 8.2* 8.1* 8.6* 8.7* 8.7*  MG 1.7  --   --   --   --   --   PHOS 3.5  --   --   --   --   --    GFR: Estimated Creatinine Clearance: 51.5 mL/min (by C-G formula based on Cr of 0.49). Liver Function  Tests:  Recent Labs Lab 10/20/15 0500 10/25/15 0801  AST 44* 32  ALT 29 33  ALKPHOS 40 46  BILITOT 0.4 1.0  PROT 5.9* 6.6  ALBUMIN 1.7* 2.2*   CBG:  Recent Labs Lab 10/24/15 0800 10/24/15 1205 10/24/15 1642 10/24/15 2156 10/25/15 0740  GLUCAP 110* 109* 103* 94 107*   Urine analysis:    Component Value Date/Time   COLORURINE YELLOW 10/22/2015 0919   APPEARANCEUR CLOUDY* 10/22/2015 0919   LABSPEC 1.015 10/22/2015 0919   PHURINE 7.0 10/22/2015 0919   GLUCOSEU NEGATIVE 10/22/2015 0919   HGBUR SMALL* 10/22/2015 0919   BILIRUBINUR NEGATIVE 10/22/2015 0919   KETONESUR NEGATIVE 10/22/2015 0919   PROTEINUR NEGATIVE 10/22/2015 0919   UROBILINOGEN 0.2 01/11/2015 1725   NITRITE POSITIVE* 10/22/2015 0919   LEUKOCYTESUR MODERATE* 10/22/2015 0919   Sepsis Labs: Invalid  input(s): PROCALCITONIN, LACTICIDVEN  Recent Results (from the past 240 hour(s))  Culture, Urine     Status: None   Collection Time: 10/22/15  9:19 AM  Result Value Ref Range Status   Specimen Description URINE, RANDOM  Final   Special Requests NONE  Final   Culture >=100,000 COLONIES/mL CITROBACTER KOSERI  Final   Report Status 10/24/2015 FINAL  Final   Organism ID, Bacteria CITROBACTER KOSERI  Final      Susceptibility   Citrobacter koseri - MIC*    CEFAZOLIN <=4 SENSITIVE Sensitive     CEFTRIAXONE <=1 SENSITIVE Sensitive     CIPROFLOXACIN <=0.25 SENSITIVE Sensitive     GENTAMICIN <=1 SENSITIVE Sensitive     IMIPENEM <=0.25 SENSITIVE Sensitive     NITROFURANTOIN 32 SENSITIVE Sensitive     TRIMETH/SULFA <=20 SENSITIVE Sensitive     PIP/TAZO 16 SENSITIVE Sensitive     * >=100,000 COLONIES/mL CITROBACTER KOSERI      Radiology Studies: Ct Head Wo Contrast  10/23/2015  CLINICAL DATA:  Altered mental status. EXAM: CT HEAD WITHOUT CONTRAST TECHNIQUE: Contiguous axial images were obtained from the base of the skull through the vertex without intravenous contrast. COMPARISON:  10/06/2015 FINDINGS: There is  no evidence for acute hemorrhage, hydrocephalus, mass lesion, or abnormal extra-axial fluid collection. No definite CT evidence for acute infarction. Diffuse loss of parenchymal volume is consistent with atrophy. Patchy low attenuation in the deep hemispheric and periventricular white matter is nonspecific, but likely reflects chronic microvascular ischemic demyelination. The visualized paranasal sinuses and mastoid air cells are clear. IMPRESSION: Stable exam without acute intracranial abnormality. Atrophy with chronic small vessel white matter ischemic disease. Electronically Signed   By: Misty Stanley M.D.   On: 10/23/2015 21:13   Scheduled Meds: . antiseptic oral rinse  7 mL Mouth Rinse q12n4p  . atorvastatin  40 mg Oral q1800  . cefTRIAXone (ROCEPHIN)  IV  1 g Intravenous Q24H  . chlorhexidine  15 mL Mouth Rinse BID  . enoxaparin (LOVENOX) injection  40 mg Subcutaneous Q24H  . feeding supplement (PRO-STAT SUGAR FREE 64)  30 mL Per Tube BID  . Gerhardt's butt cream   Topical QID  . lidocaine  2 patch Transdermal Q24H  . metoprolol succinate  37.5 mg Oral QHS  . pantoprazole  40 mg Oral Daily  . traZODone  25 mg Oral QHS   Continuous Infusions: . dextrose 5 % and 0.45 % NaCl with KCl 20 mEq/L 30 mL/hr (10/24/15 0420)  . feeding supplement (VITAL AF 1.2 CAL) 1,000 mL (10/22/15 0437)   Marzetta Board, MD, PhD [facility] Pager [phone]  If 7PM-7AM, please contact night-coverage [email or web address] Password [facility] 10/25/2015, 12:10 PM

## 2015-10-25 NOTE — Procedures (Signed)
ELECTROENCEPHALOGRAM REPORT  Patient: Cynthia [name]       Room #: [id number] EEG No. ID: [id number] Age: 80 y.o.        Sex: female Referring Physician: Wardell Heath Report Date:  10/25/2015        Interpreting Physician: Anthony Sar  History: PABLO FERKO is an 80 y.o. female with Guillain-Barr syndrome with altered mental status with confusion and visual hallucinations as well as confabulations.  Indications for study:  Rule out encephalopathy; rule out seizure activity.  Technique: This is an 18 channel routine scalp EEG performed at the bedside with bipolar and monopolar montages arranged in accordance to the international 10/20 system of electrode placement.   Description: This EEG recording was performed during wakefulness. Predominant activity consisted of diffuse low amplitude symmetrical irregular delta activity with superimposed low amplitude diffuse 20-25 Hz beta activity. Photic stimulation was not performed. Were numerous occurrences of flailing of her upper extremities, with no associated change in background electrocortical activity. No epileptiform discharges were recorded.  Interpretation: CT study is abnormal with mild generalized nonspecific continuous slowing of cerebral activity. This pattern of slowing can be seen with metabolic and toxic encephalopathies, as well as with degenerative central nervous system disorders. No evidence of an epileptic disorder was demonstrated.   Rush Farmer M.D. [facility] [phone]

## 2015-10-25 NOTE — Progress Notes (Signed)
RT entered room for NIF and VC.  Patient noted somnolent during encounter.  Speaks in soft tone, arouses with verbal stimulation.  Noted mild confusion; oriented to self and place, but not time.  Patient not able to lift head from pillow.  Weak/ineffective cough noted with coaching.  VC attempt 1: 350 VC attempt 2: 750  NIF attempt 1: -10 NIF attempt 2: -15  Daytime RT noted patient too lethargic for NIF and VC testing.    Vital signs stable at this time HR: 68 RR: 15 BP: 103/65 SpO2: 98% on RA.

## 2015-10-26 LAB — GLUCOSE, CAPILLARY
GLUCOSE-CAPILLARY: 99 mg/dL (ref 65–99)
GLUCOSE-CAPILLARY: 93 mg/dL (ref 65–99)
Glucose-Capillary: 128 mg/dL — ABNORMAL HIGH (ref 65–99)
Glucose-Capillary: 108 mg/dL — ABNORMAL HIGH (ref 65–99)

## 2015-10-26 LAB — PROCALCITONIN

## 2015-10-26 MED ORDER — POLYETHYLENE GLYCOL 3350 17 G PO PACK
17.0000 g | PACK | Freq: Two times a day (BID) | ORAL | Status: DC
  Administered 2015-10-26 – 2015-10-27 (×2): 17 g via ORAL
  Filled 2015-10-26 (×2): qty 1

## 2015-10-26 MED ORDER — BOOST PLUS PO LIQD
237.0000 mL | Freq: Three times a day (TID) | ORAL | Status: DC
  Administered 2015-10-26 – 2015-10-27 (×4): 237 mL via ORAL
  Filled 2015-10-26 (×7): qty 237

## 2015-10-26 MED ORDER — GLYCERIN (LAXATIVE) 2.1 G RE SUPP
1.0000 | Freq: Once | RECTAL | Status: AC
  Administered 2015-10-26: 1 via RECTAL
  Filled 2015-10-26 (×2): qty 1

## 2015-10-26 NOTE — Progress Notes (Signed)
PROGRESS NOTE  Cynthia [name] [id number] DOB: 1936/08/11 DOA: 10/06/2015 PCP: [name], MD Outpatient Specialists:    LOS: 17 days   Brief Narrative: 80 yo female presented with paresthesia in upper/lower extremities. This was associated with gait ataxia. She developed coffee ground emesis and hypotension . Patient was transferred to the ICU and intubated on 2/26.  Assessment & Plan: Active Problems:   Colon cancer metastasized to liver [facility])   Peripheral neuropathy (HCC)   Paresthesia of both hands   Paresthesia of both feet   Cerebral embolism with cerebral infarction   Shock ([place])   Hemoptysis   Transverse myelitis ([place])   Acute respiratory failure (HCC)   Aspiration into airway   Endotracheally intubated   GBS (Guillain-Barre syndrome) (HCC)   GBS - neurology was consulted and have followed patient while hospitalized, she has received 5 day course of IVIG with subsequent improvement in her weakness.  - PT/OT consulted with recs for CIR - Able to discontinue rectal tube as well as Foley catheter yesterday 3/9, however Foley catheter needed to be placed back again on 3/10 given ongoing urinary retention - Self removed her feeding overnight 3/9, however fortunately she passed a swallow eval 3/10 - Discussed with inpatient rehabilitation today, patient's mental status is improved today, if this maintains and patient is able to be involved in physical therapy may be able to be discharged to inpatient rehabilitation tomorrow  Hypoxic respiratory failure - Patient required intubation on PCCM service until 3/7, successfully extubated 3/6 and currently remains on min O2 support - continue NIF - stable today, on room air  Confusion / intermittent hallucinations / delirium - patient with intermittent hallucinations throughout her hospital stay, related to morphine initially and also to fenanyl - has not used narcotics recently, fentanyl discontinued on 3/9 -  decreased gabapentin 3/9, discontinued altogether on 3/10 - Avoid benzodiazepines as patient became more agitated yesterday after benzodiazepines use - Progressively getting worse, I reconsulted neurology, discussed with Dr. Nicole Kindred, appreciate input. EEG non specific. CT scan of the head unremarkable. She was B12 deficient last year, received by mouth supplementation, her B12 looks somewhat improved, however got another B12 shot on 3/11  - MMA level pending - consulted psychiatry 3/12 to help with diagnosis and treatment, appreciate input  - Much improved today, she is somewhat recalls sold hallucinations over the weekend, she appears close to baseline as far as I could tell.  UTI - UA with strong evidence of infection, patient also symptomatic - start Ceftriaxone, first dose on 3/9 - urine cultures with Citrobacter, sensitive to ceftriaxone, continue for now, plan for a full 7-10 days - We'll be able to narrow 2 by mouth on discharge  HTN - BP currently stable, albeit suboptimal - Continue her oral antihypertensive agents  Acute blood loss anemia secondary to upper gi bleed - Pt was seen by GI and underwent EGD on 3/1 with findings of tiny to small hiatal hernia and prox linear ulcer questionable from NG tube - Hgb stable at present  HCAP - Completed course of abx while in ICU with Vancomycin and Zosyn, end date 3/4   DVT prophylaxis: Lovenox Code Status: Full Family Communication: no family bedside Disposition Plan: CIR When ready, 1-2 days if mental status remains clear  Consultants:   Neurology   PCCM  Psychiatry   Procedures:   EGD 3/1  Cortrak tube Discontinued 3/9  Antimicrobials:  Vancomycin / Zosyn - finished course 3/4  Ceftriaxone 3/9 >>  Subjective: -  Alert and oriented 4, has some recollection of being in an airplane over the weekend, she is much more appropriate today, denies any chest pain, denies any abdominal pain, she has no nausea, vomiting or  diarrhea  Objective: Filed Vitals:   10/26/15 0216 10/26/15 0328 10/26/15 0646 10/26/15 0728  BP: 123/75 106/60  138/71  Pulse:  65  65  Temp:  97.4 F (36.3 C)  97.7 F (36.5 C)  TempSrc:  Oral  Oral  Resp:  16  16  Height:      Weight:   60.827 kg (134 lb 1.6 oz)   SpO2:  100%  98%    Intake/Output Summary (Last 24 hours) at 10/26/15 1207 Last data filed at 10/26/15 1000  Gross per 24 hour  Intake    750 ml  Output    900 ml  Net   -150 ml   Filed Weights   10/24/15 0416 10/25/15 0427 10/26/15 0646  Weight: 61.236 kg (135 lb) 63.322 kg (139 lb 9.6 oz) 60.827 kg (134 lb 1.6 oz)   Examination: BP 138/71 mmHg  Pulse 65  Temp(Src) 97.7 F (36.5 C) (Oral)  Resp 16  Ht 5\' 2"  (1.575 m)  Wt 60.827 kg (134 lb 1.6 oz)  BMI 24.52 kg/m2  SpO2 98% General exam: NAD, pleasant, Respiratory system: Clear. No increased work of breathing. No wheezing, no crackles Cardiovascular system: regular rate and rhythm, no murmurs, gallops. No JVD. No peripheral edema.  Gastrointestinal system: Abdomen is nondistended, soft and nontender. Normal bowel sounds heard. Central nervous system: AxOx 3. Strength 4 out of 5 in the upper extremities, 1 out of 5 in the lower extremities. Extremities: No clubbing/cyanosis Skin: no rashes  Data Reviewed: I have personally reviewed following labs and imaging studies  CBC:  Recent Labs Lab 10/20/15 0500 10/21/15 0400 10/23/15 0357 10/24/15 0547 10/25/15 0801  WBC 9.3 10.0 12.5* 10.7* 7.7  NEUTROABS 7.5  --   --   --   --   HGB 8.9* 9.5* 10.3* 10.0* 10.3*  HCT 28.3* 29.9* 32.7* 31.8* 32.9*  MCV 90.1 89.8 89.6 90.1 90.1  PLT 244 265 404* 459* Q000111Q*   Basic Metabolic Panel:  Recent Labs Lab 10/20/15 0500 10/21/15 0400 10/23/15 0357 10/24/15 0547 10/25/15 0801  NA 138 133* 132* 135 134*  K 4.0 4.3 4.2 4.2 4.2  CL 101 103 99* 101 99*  CO2 28 22 25 25 25   GLUCOSE 133* 108* 113* 118* 106*  BUN 21* 20 23* 22* 16  CREATININE 0.56 0.46  0.45 0.51 0.49  CALCIUM 8.2* 8.1* 8.6* 8.7* 8.7*   GFR: Estimated Creatinine Clearance: 50.6 mL/min (by C-G formula based on Cr of 0.49). Liver Function Tests:  Recent Labs Lab 10/20/15 0500 10/25/15 0801  AST 44* 32  ALT 29 33  ALKPHOS 40 46  BILITOT 0.4 1.0  PROT 5.9* 6.6  ALBUMIN 1.7* 2.2*   CBG:  Recent Labs Lab 10/25/15 1144 10/25/15 1648 10/25/15 2154 10/26/15 0830 10/26/15 1143  GLUCAP 120* 116* 110* 99 128*   Urine analysis:    Component Value Date/Time   COLORURINE YELLOW 10/22/2015 0919   APPEARANCEUR CLOUDY* 10/22/2015 0919   LABSPEC 1.015 10/22/2015 0919   PHURINE 7.0 10/22/2015 0919   GLUCOSEU NEGATIVE 10/22/2015 0919   HGBUR SMALL* 10/22/2015 0919   BILIRUBINUR NEGATIVE 10/22/2015 0919   KETONESUR NEGATIVE 10/22/2015 0919   PROTEINUR NEGATIVE 10/22/2015 0919   UROBILINOGEN 0.2 01/11/2015 1725   NITRITE POSITIVE* 10/22/2015 0919  LEUKOCYTESUR MODERATE* 10/22/2015 0919   Sepsis Labs: Invalid input(s): PROCALCITONIN, LACTICIDVEN  Recent Results (from the past 240 hour(s))  Culture, Urine     Status: None   Collection Time: 10/22/15  9:19 AM  Result Value Ref Range Status   Specimen Description URINE, RANDOM  Final   Special Requests NONE  Final   Culture >=100,000 COLONIES/mL CITROBACTER KOSERI  Final   Report Status 10/24/2015 FINAL  Final   Organism ID, Bacteria CITROBACTER KOSERI  Final      Susceptibility   Citrobacter koseri - MIC*    CEFAZOLIN <=4 SENSITIVE Sensitive     CEFTRIAXONE <=1 SENSITIVE Sensitive     CIPROFLOXACIN <=0.25 SENSITIVE Sensitive     GENTAMICIN <=1 SENSITIVE Sensitive     IMIPENEM <=0.25 SENSITIVE Sensitive     NITROFURANTOIN 32 SENSITIVE Sensitive     TRIMETH/SULFA <=20 SENSITIVE Sensitive     PIP/TAZO 16 SENSITIVE Sensitive     * >=100,000 COLONIES/mL CITROBACTER KOSERI      Radiology Studies: No results found. Scheduled Meds: . antiseptic oral rinse  7 mL Mouth Rinse q12n4p  . atorvastatin  40 mg Oral  q1800  . cefTRIAXone (ROCEPHIN)  IV  1 g Intravenous Q24H  . chlorhexidine  15 mL Mouth Rinse BID  . enoxaparin (LOVENOX) injection  40 mg Subcutaneous Q24H  . feeding supplement (PRO-STAT SUGAR FREE 64)  30 mL Per Tube BID  . Gerhardt's butt cream   Topical QID  . Glycerin (Adult)  1 suppository Rectal Once  . lidocaine  2 patch Transdermal Q24H  . metoprolol succinate  37.5 mg Oral QHS  . pantoprazole  40 mg Oral Daily  . polyethylene glycol  17 g Oral BID  . QUEtiapine  12.5 mg Oral QHS   Continuous Infusions: . dextrose 5 % and 0.45 % NaCl with KCl 20 mEq/L 30 mL/hr at 10/25/15 1700  . feeding supplement (VITAL AF 1.2 CAL) 1,000 mL (10/22/15 0437)   Marzetta Board, MD, PhD [facility] Pager [phone]  If 7PM-7AM, please contact night-coverage [email or web address] Password [facility] 10/26/2015, 12:07 PM

## 2015-10-26 NOTE — Progress Notes (Signed)
Physical Therapy Treatment Patient Details Name: Cynthia [name] MRN: [id number] DOB: Jul 30, 1936 Today's Date: 10/26/2015    History of Present Illness Cynthia [name] is a 80 y.o. female with metastatic colon cancer, status post right colectomy and chemotherapy 2015. In December patient was found to have liver lesions, biopsy confirmed metastatic colon cancer. She underwent ablation of the liver lesions on the tenth of last month.  Was found to have GBS and was intubated 2/26-10/18/15.    PT Comments    Pt very fatigued today and indicates pain in Bil LEs limiting sitting EOB tolerance.  Encouraged pt to attempt to A with mobility, but pt indicates too painful and fatigued.  Continue to feel pt will need continued therapy to maximize independence and decrease burden of care at D/C.    Follow Up Recommendations  CIR     Equipment Recommendations  Wheelchair (measurements PT);Wheelchair cushion (measurements PT)    Recommendations for Other Services       Precautions / Restrictions Precautions Precautions: Fall Restrictions Weight Bearing Restrictions: No    Mobility  Bed Mobility Overal bed mobility: Needs Assistance;+2 for physical assistance Bed Mobility: Supine to Sit;Sit to Supine     Supine to sit: Total assist;HOB elevated Sit to supine: Total assist;+2 for physical assistance   General bed mobility comments: pt with minla participation in bed mobility today and c/o fatigue and Bil LE pain.    Transfers                    Ambulation/Gait                 Stairs            Wheelchair Mobility    Modified Rankin (Stroke Patients Only)       Balance Overall balance assessment: Needs assistance Sitting-balance support: Feet supported Sitting balance-Leahy Scale: Zero Sitting balance - Comments: No attempts at assisting to maintain balance today despite cueing.   Postural control: Posterior lean                          Cognition Arousal/Alertness: Lethargic Behavior During Therapy: WFL for tasks assessed/performed Overall Cognitive Status: Within Functional Limits for tasks assessed                      Exercises      General Comments        Pertinent Vitals/Pain Pain Assessment: Faces Faces Pain Scale: Hurts even more Pain Location: Bil LEs Pain Descriptors / Indicators: Grimacing Pain Intervention(s): Monitored during session;Repositioned;Limited activity within patient's tolerance    Home Living                      Prior Function            PT Goals (current goals can now be found in the care plan section) Acute Rehab PT Goals Patient Stated Goal: get some rest PT Goal Formulation: With patient Time For Goal Achievement: 11/02/15 Potential to Achieve Goals: Fair Progress towards PT goals: Not progressing toward goals - comment (Fatigue and pain today.)    Frequency  Min 3X/week    PT Plan Current plan remains appropriate    Co-evaluation             End of Session   Activity Tolerance: Patient limited by fatigue;Patient limited by pain Patient left: in bed;with call bell/phone within reach  Time: [id number] PT Time Calculation (min) (ACUTE ONLY): 15 min  Charges:  $Therapeutic Activity: 8-22 mins                    G CodesCatarina Hartshorn, [place] [phone] 10/26/2015, 9:35 AM

## 2015-10-26 NOTE — Plan of Care (Signed)
Problem: Activity: Goal: Risk for activity intolerance will decrease Outcome: Progressing Pt sat up in chair for 1:45 min. Tolerated well. Used maxi lift. Pt ate 10% of meal sitting up. Pt desired going to sleep, now resting comfortably back in bed.

## 2015-10-26 NOTE — PMR Pre-admission (Signed)
PMR Admission Coordinator Pre-Admission Assessment  Patient: Cynthia [name] is an 80 y.o., female MRN: [id number] DOB: August 20, 1935 Height: 5\' 2"  (157.5 cm) Weight: 58.106 kg (128 lb 1.6 oz)              Insurance Information HMO:     PPO: yes     PCP:      IPA:      80/20:      OTHER: Medicare replacement policy PRIMARY: [place]      Policy#: [id number]      Subscriber: pt CM Name: Orvan July      Phone#: [phone]    Fax#: [facility] portal Pre-Cert#: [id number]    Employer: retired F/u with [employer]. Has EMR access Benefits:  Phone #: [phone]     Name: 3/13 Eff. Date: 08/16/15     Deduct: none      Out of Pocket Max: $4000      Life Max: none CIR: $160 per day days 1-10 then covers 100%      SNF: no copay days 1-20; $50 copay days 21-100 Outpatient: $20 per visit     Co-Pay: no visit limit Home Health: 100%      Co-Pay: no visit limit DME: 80%     Co-Pay: 20% Providers: in netowrk  SECONDARY: none       Medicaid Application Date:       Case Manager:  Disability Application Date:       Case Worker:   Emergency Contact Information Contact Information    Name Relation Home Work Mobile   [employer] Daughter [phone]  [phone]   Gary Fleet [phone]       Current Medical History  Patient Admitting Diagnosis: 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 right handed female with history of hypertension, CVA maintained on Plavix and little residual weakness, metastatic colon cancer status post right colectomy and chemotherapy 2015 followed by radiation oncology Dr. Lisbeth Renshaw.. Recently underwent ablation of liver lesions 09/25/2015. Patient lives with daughter and independent prior to admission. One level home. Presented 10/06/2015 with complaints of tingling in her hands and feet and numbness to right arm and leg with associated dizziness. Cranial CT scan MRI showed no acute abnormalities.  Echocardiogram with ejection fraction 65% mild MR. MRI thoracic lumbar spine show peripheral enhancement of the distal thoracic spinal cord as well as the nerve roots suggesting Guillain-Barr syndrome. Neurology follow-up placed on IVIG 5 days. Patient with acute respiratory failure required intubation 10/11/15- 10/18/2015. Hospital course gastroenterology for coffee-ground emesis hemoglobin 7.6 with EGD completed showing no active bleeding there was a tiny to small hiatal hernia. Hemoglobin and hematocrit remained stable latest hemoglobin 9.5. Patient has a nasogastric tube for for nutritional support and currently remains nothing by mouth with follow-up per speech therapy. Marland KitchenHCAP with antibiotic course completed. Subcutaneous Lovenox added for DVT prophylaxis 10/16/2015.   Rectal tube removed and foley catheter on 3/9 however foley replaced on 3/10 due to urinary retention. Self removed feeding tube on 3/9 but passed swallow eval on 3/10.   Intermittent hallucinations with narcotics with all removed 3/9 and discontinued gabapentin on 3/10. Patient became agitated over the weekend with neurology consulted and ordered EEG which was nonspecific and CT scan head unremarkable. UTI with Ceftriaxone began 3/9. With urine cultures with citrobacter . Plan for 7- 11 day course of treatment.  Past Medical History  Past Medical History  Diagnosis Date  . Anemia   .  Blood transfusion without reported diagnosis jan 2015  . Heart murmur   . Hypertension     no bp meds   . Liver cyst   . C. difficile colitis 03/05/14, 03/22/14, 04/05/14    recurrent  . Stroke ([place]) 12/2014    NUMBNESS ON LEFT SIDE  . Cervical cancer ([place]) 1972  . Colon cancer ([place]) JAN 2015  . Colon cancer ([place])   . Cancer ([place])     liver  . Complication of anesthesia     slow to awaken in past, DONE WELL RECENTLY  . Tinnitus of both ears ALL THE TIME  . DVT (deep venous thrombosis) ([place]) 10/24/13    Right leg    Family History  family  history includes Aneurysm in her paternal grandmother; Breast cancer in her maternal aunt; Cancer in her maternal grandmother and sister; Diabetes in her brother and sister; Heart failure in her brother, father, and maternal grandfather; Hypertension in her brother; Rheum arthritis in her mother.  Prior Rehab/Hospitalizations:  Has the patient had major surgery during 100 days prior to admission? No  Current Medications   Current facility-administered medications:  .  acetaminophen (TYLENOL) tablet 650 mg, 650 mg, Oral, Q6H PRN, Caren Griffins, MD, 650 mg at 10/26/15 2038 .  antiseptic oral rinse (CPC / CETYLPYRIDINIUM CHLORIDE 0.05%) solution 7 mL, 7 mL, Mouth Rinse, q12n4p, Raylene Miyamoto, MD, 7 mL at 10/27/15 1243 .  atorvastatin (LIPITOR) tablet 40 mg, 40 mg, Oral, q1800, Caren Griffins, MD, 40 mg at 10/26/15 1750 .  cefTRIAXone (ROCEPHIN) 1 g in dextrose 5 % 50 mL IVPB, 1 g, Intravenous, Q24H, Costin Karlyne Greenspan, MD, 1 g at 10/27/15 1240 .  chlorhexidine (PERIDEX) 0.12 % solution 15 mL, 15 mL, Mouth Rinse, BID, Raylene Miyamoto, MD, 15 mL at 10/27/15 1104 .  dextrose 5 % and 0.45 % NaCl with KCl 20 mEq/L infusion, , Intravenous, Continuous, Chesley Mires, MD, Last Rate: 30 mL/hr at 10/27/15 0407 .  enoxaparin (LOVENOX) injection 40 mg, 40 mg, Subcutaneous, Q24H, Otilio Miu, RPH, 40 mg at 10/27/15 1108 .  Gerhardt's butt cream, , Topical, QID, Chesley Mires, MD .  hydrALAZINE (APRESOLINE) injection 10 mg, 10 mg, Intravenous, Q4H PRN, Chesley Mires, MD, 10 mg at 10/14/15 0242 .  lactose free nutrition (BOOST PLUS) liquid 237 mL, 237 mL, Oral, TID BM, Costin Karlyne Greenspan, MD, 237 mL at 10/27/15 1106 .  lidocaine (LIDODERM) 5 % 2 patch, 2 patch, Transdermal, Q24H, Ram Fuller Mandril, MD, 2 patch at 10/26/15 1900 .  metoprolol succinate (TOPROL-XL) 24 hr tablet 37.5 mg, 37.5 mg, Oral, QHS, Caren Griffins, MD, 37.5 mg at 10/26/15 2257 .  pantoprazole (PROTONIX) EC tablet 40 mg, 40  mg, Oral, Daily, Caren Griffins, MD, 40 mg at 10/27/15 1106 .  polyethylene glycol (MIRALAX / GLYCOLAX) packet 17 g, 17 g, Oral, BID, Caren Griffins, MD, 17 g at 10/27/15 1107 .  QUEtiapine (SEROQUEL) tablet 12.5 mg, 12.5 mg, Oral, QHS, Caren Griffins, MD, 12.5 mg at 10/26/15 2257  Patients Current Diet: DIET DYS 3 Room service appropriate?: Yes; Fluid consistency:: Thin  Precautions / Restrictions Precautions Precautions: Fall Restrictions Weight Bearing Restrictions: No   Has the patient had 2 or more falls or a fall with injury in the past year?No  Prior Activity Level Community (5-7x/wk): Did not drive due to CVA [id number]. Independent without AD. Did own cooking and own adls. Vision affected by previous CVA only. 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 Av[employer]. Retired [employer].  Home Assistive Devices / Equipment Home Assistive Devices/Equipment: Bedside commode/3-in-1, Crutches, Grab bars around toilet, Wheelchair (all equipment from her Mom when she cared for her) Home Equipment: Environmental consultant - 2 wheels, Environmental consultant - standard, [employer] - quad, Crutches, Bedside commode, Shower seat, Grab bars - tub/shower  Prior Device Use: Indicate devices/aids used by the patient prior to current illness, exacerbation or injury? None of the above  Prior Functional Level Prior Function Level of Independence: Independent Comments: cooked and did all own adls.  Self Care: Did the patient need help bathing, dressing, using the toilet or eating?  Independent  Indoor Mobility: Did the patient need assistance with walking from room to room (with or without device)? Independent  Stairs: Did the patient need assistance with internal or external stairs (with or without device)? Independent  Functional Cognition: Did the patient need help planning regular tasks such as shopping or remembering to take medications? Independent  Current Functional Level Cognition  Overall Cognitive Status:  Within Functional Limits for tasks assessed (a little slow to process) Orientation Level: Oriented X4    Extremity Assessment (includes Sensation/Coordination)  Upper Extremity Assessment: Generalized weakness RUE Deficits / Details: AAROM WFL (minimal pain when flexing shoulder to 180*-full range), strength shoulder flexion 2+/5, elbow flexion 3/5, elbow extension 2/5; numbness and tingling fingers to elbow; poor control with movements, i.e. hit herself when flexing elbows RUE Sensation: decreased light touch RUE Coordination: decreased gross motor, decreased fine motor LUE Deficits / Details: AAROM WFL (minimal pain when flexing shoulder to 180*-full range), strength shoulder flexion 2+/5, elbow flexion 3/5, elbow extension 2/5; numbness and tingling fingers to elbow; poor control with movements, i.e. hit herself when flexing elbows LUE Sensation: decreased light touch LUE Coordination: decreased gross motor, decreased fine motor  Lower Extremity Assessment: Defer to PT evaluation RLE Deficits / Details: AAROM WFL, strength hip flexion 2-/5, knee extension 1+/5, ankle DF 0/5, numbness and tingling feet to knees RLE Sensation: decreased proprioception, decreased light touch RLE Coordination: decreased gross motor LLE Deficits / Details: AAROM WFL, strength hip flexion 2-/5, knee extension 1+/5, ankle DF 0/5, numbness and tingling feet to knees LLE Sensation: decreased light touch, decreased proprioception LLE Coordination: decreased gross motor    ADLs  Overall ADL's : Needs assistance/impaired Eating/Feeding: Maximal assistance, Total assistance, Bed level Eating/Feeding Details (indicate cue type and reason): hand over hand assist until pt too tired to perform Grooming: Maximal assistance, Total assistance, Wash/dry hands, Wash/dry face, Oral care, Brushing hair Grooming Details (indicate cue type and reason): hand over hand assist until pt too tired to perform Upper Body Bathing: Total  assistance, Sitting Upper Body Bathing Details (indicate cue type and reason): supported in recliner, recommending bed level ADL at this time Lower Body Bathing: Total assistance, +2 for physical assistance, +2 for safety/equipment, Sitting/lateral leans Lower Body Bathing Details (indicate cue type and reason): supported in recliner, recommending bed level ADL at this time Upper Body Dressing : Total assistance, Sitting Upper Body Dressing Details (indicate cue type and reason): supported in recliner, recommending bed level ADL at this time Lower Body Dressing: Total assistance, +2 for physical assistance, +2 for safety/equipment, Sitting/lateral leans Lower Body Dressing Details (indicate cue type and reason): supported in recliner, recommending bed level ADL at this time Toilet Transfer Details (indicate cue type and reason): did not occur, pt requires lift transfer for OOB, recommend bedpan a this time Toileting- Clothing Manipulation and Hygiene:  Total assistance, Bed level, +2 for safety/equipment Tub/Shower Transfer Details (indicate cue type and reason): did not occur, pt requires lift transfer for OOB General ADL Comments: Pt found seated in recliner after nursing staff assisted pt into recliner via lift transfer. At this time, recommend pt complete bed level ADL for patient and staff safety. Recommend bedpan at this time as well.     Mobility  Overal bed mobility: Needs Assistance, +2 for physical assistance Bed Mobility: Supine to Sit, Sit to Supine Rolling: Max assist, +2 for physical assistance Sidelying to sit: Total assist, +2 for physical assistance Supine to sit: Total assist, HOB elevated Sit to supine: Total assist, +2 for physical assistance Sit to sidelying: Min assist (assist to bring LEs onto bed) General bed mobility comments: Pt found seated in recliner after nursing staff assisted with getting her OOB    Transfers  Overall transfer level: Needs assistance Transfer  via Lift Equipment: Maximove Transfers: Lateral/Scoot Transfers  Lateral/Scoot Transfers: Max assist General transfer comment: Did not occur, maxi move used to transfer patient by nursing staff prior to OT entering room    Ambulation / Gait / Stairs / Office manager / Balance Dynamic Sitting Balance Sitting balance - Comments: No attempts at assisting to maintain balance today despite cueing.   Balance Overall balance assessment: Needs assistance Sitting-balance support: Bilateral upper extremity supported, Feet supported Sitting balance-Leahy Scale: Zero Sitting balance - Comments: No attempts at assisting to maintain balance today despite cueing.   Postural control: Posterior lean Standing balance comment: unable dur to paralysis     Special needs/care consideration  Skin reddened perianal area for had rectal tube last week up until 3/9 with frequent BMS. Using Gearhardt cream prn for moisture                              Bowel mgmt: incontinent Bladder mgmt: Foley reinserted due to urinary retention. Treating since 3/9 for UTI    Previous Home Environment Living Arrangements: Children (Pt and dtr, Merchant navy officer, live together)  Lives With: Daughter Available Help at Discharge: Family, Available 24 hours/day Type of Home: House Home Layout: One level Home Access: Stairs to enter Entrance Stairs-Rails: None Entrance Stairs-Number of Steps: 1 Bathroom Shower/Tub: Optometrist: Yes How Accessible: Accessible via walker [place]: No  Discharge Living Setting Plans for Discharge Living Setting: Patient's home, Lives with (comment) (daughter) Type of Home at Discharge: House Discharge Home Layout: One level Discharge Home Access: Stairs to enter Entrance Stairs-Rails: None Entrance Stairs-Number of Steps: 1 Discharge Bathroom Shower/Tub: Tub/shower unit Discharge Bathroom Toilet: Standard Discharge  Bathroom Accessibility: Yes How Accessible: Accessible via walker Does the patient have any problems obtaining your medications?: No  Social/Family/Support Systems Patient Roles: Parent Contact Information: [name], daughter Anticipated Caregiver: daughter  Anticipated Caregiver's Contact Information: see above Ability/Limitations of Caregiver: Dtr cleans houses so can arrange assist with sister in law as needed Caregiver Availability: 24/7 Discharge Plan Discussed with Primary Caregiver: Yes Is Caregiver In Agreement with Plan?: Yes Does Caregiver/Family have Issues with Lodging/Transportation while Pt is in Rehab?: No  Goals/Additional Needs Patient/Family Goal for Rehab: Min to mod assist short distances at wheelchair level Expected length of stay: ELOS 22-28 days Dietary Needs: poor appetite . Likes boost Pt/Family Agrees to Admission and willing to participate: Yes Program Orientation Provided & Reviewed with Pt/Caregiver Including Roles  &  Responsibilities: Yes  Decrease burden of Care through IP rehab admission: n/a  Possible need for SNF placement upon discharge: not anticipated  Patient Condition: This patient's medical and functional status has changed since the consult dated: 10/21/15 in which the Rehabilitation Physician determined and documented that the patient's condition is appropriate for intensive rehabilitative care in an inpatient rehabilitation facility. See "History of Present Illness" (above) for medical update. Functional changes are: max to total assist. Patient's medical and functional status update has been discussed with the Rehabilitation physician and patient remains appropriate for inpatient rehabilitation. Will admit to inpatient rehab today.  Preadmission Screen Completed By:  Cleatrice Burke, 10/27/2015 1:56 PM ______________________________________________________________________   Discussed status with Dr. Posey Pronto on 10/27/15 at  1355 and received  telephone approval for admission today.  Admission Coordinator:  Cleatrice Burke, time [id number] Date 10/27/15

## 2015-10-26 NOTE — Progress Notes (Signed)
Speech Language Pathology Treatment: Dysphagia  Patient Details Name: Cynthia [name] MRN: [id number] DOB: 12-05-1935 Today's Date: 10/26/2015 Time: [id number] SLP Time Calculation (min) (ACUTE ONLY): 25 min  Assessment / Plan / Recommendation Clinical Impression  Continued PO trials with implementation of safe swallow strategies including chin tuck with thin liquids. Pt unable to tolerate fully upright positioning secondary to LE pain. Pt without overt signs or symptoms this date with any PO trials (ice chips and puree) however pt not agreeable to multiple PO consistencies and states she has a poor appetite.  Nursing reports pt difficulty with regular consistency (hamburger) with noted fatigue and poor endurance. Recommend diet downgrade to dysphagia 3  (mechancial soft) with thin liquids with continued use of chin tuck strategy. Recommmend medicines whole in puree. ST to follow up.     HPI HPI: Cynthia [name] is an 80 y.o. female patient presented with paresthesia in UE/LE. 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 Pt developed coffee ground emesis and hypotension - acute UGI bleed on 2/24.  Pt was intubated from 10/11/15 until 10/18/15. She has hx of finding of chronic right  basal ganglia CVA in May 2016 (BSE done, pt normal), metastatic colon cancer s/p Rt colectomy.       SLP Plan  Continue with current plan of care     Recommendations  Diet recommendations: Dysphagia 3 (mechanical soft);Thin liquid Liquids provided via: Teaspoon;Cup Medication Administration: Whole meds with puree Supervision: Full supervision/cueing for compensatory strategies;Trained caregiver to feed patient Compensations: Chin tuck;Slow rate;Small sips/bites Postural Changes and/or Swallow Maneuvers: Seated upright 90 degrees;Upright 30-60 min after meal             General recommendations: Rehab  consult Oral Care Recommendations: Oral care BID Follow up Recommendations: Inpatient Rehab Plan: Continue with current plan of care     [place] MA, [place] Language Pathologist    Levi Aland 10/26/2015, 5:11 PM

## 2015-10-26 NOTE — Progress Notes (Signed)
RT Note: NIF and Vital Capacity were performed with patient this AM. She is very sleepy but is arousable and able to follow commands. She gave a good effort considering she is very tired this morning. The results are below:  Nif- -28 Vital Capacity- .550 of 1 Liter  RT will continue to perform mechanics and monitory.

## 2015-10-26 NOTE — Progress Notes (Signed)
I have requested OT to eval pt tomorrow morning so that I can pursue a possible inpt rehab admission tomorrow. Noted Nursing lifted pt to chair today with lift as requested and pt tolerated well. [id number]

## 2015-10-26 NOTE — Progress Notes (Signed)
I met with pt at bedside and then contacted her daughter by phone. Pt just worked with PT this am at EOB only. I discussed with pt and her daughter the need to be able to get out of bed to increase her tolerance to participate with therapy, Daughter states that pt's appetite has been poor until yesterday, little sleep due to confusion and hand movements even when resting, and her UTI issues. I encouraged pt and discussed with her daughter the need to get up in the chair. I will discuss with Nursing if lifting into a chair could be a possibility BID. I will discuss with RN CM and SW and follow. [phone]

## 2015-10-26 NOTE — Progress Notes (Signed)
Nutrition Follow-up  DOCUMENTATION CODES:   Not applicable  INTERVENTION:    Boost Plus PO TID, each supplement provides 360 kcal and 14 gm protein  NUTRITION DIAGNOSIS:   Inadequate oral intake related to poor appetite as evidenced by meal completion < 25%.  Ongoing  GOAL:   Patient will meet greater than or equal to 90% of their needs  Unmet  MONITOR:   PO intake, Supplement acceptance, Labs, Weight trends  ASSESSMENT:   80 yo Female with metastatic colon cancer, and with neurologic issues of numbness tingling increased weakness. Stay complicated by GI bleed. Transfer to the ICU for hypotension.  Patient is now receiving a regular diet. No longer receiving TF. Cortrak tube has been removed. Patient c/o poor appetite due to inactivity. She has been eating ~10% of meals per discussion with RN. She likes chocolate Boost Plus supplement, will order. SLP following.  Diet Order:  Diet regular Room service appropriate?: Yes; Fluid consistency:: Nectar Thick  Skin:  Reviewed, no issues  Last BM:  3/7  Height:   Ht Readings from Last 1 Encounters:  10/22/15 5\' 2"  (1.575 m)    Weight:   Wt Readings from Last 1 Encounters:  10/26/15 134 lb 1.6 oz (60.827 kg)    Ideal Body Weight:  50 kg  BMI:  Body mass index is 24.52 kg/(m^2).  Estimated Nutritional Needs:   Kcal:  1500-1700  Protein:  80-100 gm  Fluid:  >/= 1.5 L  EDUCATION NEEDS:   No education needs identified at this time  Molli Barrows, [place], [place], [place] Pager [phone] After Hours Pager [phone]

## 2015-10-27 ENCOUNTER — Inpatient Hospital Stay (HOSPITAL_COMMUNITY)
Admission: RE | Admit: 2015-10-27 | Discharge: 2015-12-01 | DRG: 052 | Disposition: A | Discharge status: Skilled Nursing Facility | Payer: Medicare Other | Source: Intra-hospital | Attending: Physical Medicine & Rehabilitation | Admitting: Physical Medicine & Rehabilitation

## 2015-10-27 DIAGNOSIS — G610 Guillain-Barre syndrome: Secondary | ICD-10-CM | POA: Diagnosis present

## 2015-10-27 DIAGNOSIS — G8220 Paraplegia, unspecified: Secondary | ICD-10-CM

## 2015-10-27 DIAGNOSIS — C787 Secondary malignant neoplasm of liver and intrahepatic bile duct: Secondary | ICD-10-CM | POA: Diagnosis present

## 2015-10-27 DIAGNOSIS — R630 Anorexia: Secondary | ICD-10-CM | POA: Insufficient documentation

## 2015-10-27 DIAGNOSIS — F4323 Adjustment disorder with mixed anxiety and depressed mood: Secondary | ICD-10-CM | POA: Diagnosis not present

## 2015-10-27 DIAGNOSIS — R609 Edema, unspecified: Secondary | ICD-10-CM

## 2015-10-27 DIAGNOSIS — I82492 Acute embolism and thrombosis of other specified deep vein of left lower extremity: Secondary | ICD-10-CM | POA: Diagnosis present

## 2015-10-27 DIAGNOSIS — N390 Urinary tract infection, site not specified: Secondary | ICD-10-CM | POA: Diagnosis present

## 2015-10-27 DIAGNOSIS — G8250 Quadriplegia, unspecified: Principal | ICD-10-CM | POA: Diagnosis present

## 2015-10-27 DIAGNOSIS — B9689 Other specified bacterial agents as the cause of diseases classified elsewhere: Secondary | ICD-10-CM | POA: Diagnosis present

## 2015-10-27 DIAGNOSIS — R202 Paresthesia of skin: Secondary | ICD-10-CM | POA: Diagnosis not present

## 2015-10-27 DIAGNOSIS — R200 Anesthesia of skin: Secondary | ICD-10-CM | POA: Diagnosis present

## 2015-10-27 DIAGNOSIS — E871 Hypo-osmolality and hyponatremia: Secondary | ICD-10-CM | POA: Diagnosis present

## 2015-10-27 DIAGNOSIS — N319 Neuromuscular dysfunction of bladder, unspecified: Secondary | ICD-10-CM | POA: Diagnosis present

## 2015-10-27 DIAGNOSIS — D62 Acute posthemorrhagic anemia: Secondary | ICD-10-CM | POA: Diagnosis present

## 2015-10-27 DIAGNOSIS — G4700 Insomnia, unspecified: Secondary | ICD-10-CM | POA: Diagnosis present

## 2015-10-27 DIAGNOSIS — R208 Other disturbances of skin sensation: Secondary | ICD-10-CM | POA: Diagnosis present

## 2015-10-27 DIAGNOSIS — Z8541 Personal history of malignant neoplasm of cervix uteri: Secondary | ICD-10-CM | POA: Diagnosis not present

## 2015-10-27 DIAGNOSIS — H9313 Tinnitus, bilateral: Secondary | ICD-10-CM | POA: Diagnosis present

## 2015-10-27 DIAGNOSIS — M4802 Spinal stenosis, cervical region: Secondary | ICD-10-CM | POA: Diagnosis present

## 2015-10-27 DIAGNOSIS — C189 Malignant neoplasm of colon, unspecified: Secondary | ICD-10-CM | POA: Diagnosis present

## 2015-10-27 DIAGNOSIS — I82502 Chronic embolism and thrombosis of unspecified deep veins of left lower extremity: Secondary | ICD-10-CM | POA: Diagnosis not present

## 2015-10-27 DIAGNOSIS — Z85038 Personal history of other malignant neoplasm of large intestine: Secondary | ICD-10-CM | POA: Diagnosis not present

## 2015-10-27 DIAGNOSIS — R1310 Dysphagia, unspecified: Secondary | ICD-10-CM | POA: Diagnosis present

## 2015-10-27 DIAGNOSIS — I69398 Other sequelae of cerebral infarction: Secondary | ICD-10-CM

## 2015-10-27 DIAGNOSIS — K922 Gastrointestinal hemorrhage, unspecified: Secondary | ICD-10-CM

## 2015-10-27 DIAGNOSIS — I10 Essential (primary) hypertension: Secondary | ICD-10-CM | POA: Diagnosis present

## 2015-10-27 DIAGNOSIS — R339 Retention of urine, unspecified: Secondary | ICD-10-CM | POA: Diagnosis not present

## 2015-10-27 LAB — BASIC METABOLIC PANEL
ANION GAP: 9 (ref 5–15)
BUN: 19 mg/dL (ref 6–20)
CHLORIDE: 103 mmol/L (ref 101–111)
CO2: 25 mmol/L (ref 22–32)
Calcium: 8.7 mg/dL — ABNORMAL LOW (ref 8.9–10.3)
Creatinine, Ser: 0.62 mg/dL (ref 0.44–1.00)
GFR calc Af Amer: >60 mL/min (ref >60)
GLUCOSE: 100 mg/dL — AB (ref 65–99)
POTASSIUM: 3.8 mmol/L (ref 3.5–5.1)
Sodium: 137 mmol/L (ref 135–145)

## 2015-10-27 LAB — PROCALCITONIN: Procalcitonin: <0.1 ng/mL

## 2015-10-27 LAB — CBC
HEMATOCRIT: 32.3 % — AB (ref 36.0–46.0)
HEMOGLOBIN: 10.1 g/dL — AB (ref 12.0–15.0)
MCH: 28.5 pg (ref 26.0–34.0)
MCHC: 31.3 g/dL (ref 30.0–36.0)
MCV: 91.2 fL (ref 78.0–100.0)
Platelets: 438 10*3/uL — ABNORMAL HIGH (ref 150–400)
RBC: 3.54 MIL/uL — ABNORMAL LOW (ref 3.87–5.11)
RDW: 16.2 % — ABNORMAL HIGH (ref 11.5–15.5)
WBC: 6 10*3/uL (ref 4.0–10.5)

## 2015-10-27 LAB — METHYLMALONIC ACID, SERUM: METHYLMALONIC ACID, QUANTITATIVE: 274 nmol/L (ref 0–378)

## 2015-10-27 LAB — GLUCOSE, CAPILLARY
GLUCOSE-CAPILLARY: 102 mg/dL — AB (ref 65–99)
Glucose-Capillary: 99 mg/dL (ref 65–99)

## 2015-10-27 MED ORDER — ENOXAPARIN SODIUM 40 MG/0.4ML ~~LOC~~ SOLN
40.0000 mg | SUBCUTANEOUS | Status: DC
  Administered 2015-10-28 – 2015-11-27 (×31): 40 mg via SUBCUTANEOUS
  Filled 2015-10-27 (×31): qty 0.4

## 2015-10-27 MED ORDER — METOPROLOL SUCCINATE ER 25 MG PO TB24
37.5000 mg | ORAL_TABLET | Freq: Every day | ORAL | Status: DC
  Administered 2015-10-27 – 2015-11-30 (×35): 37.5 mg via ORAL
  Filled 2015-10-27 (×35): qty 2

## 2015-10-27 MED ORDER — BOOST PLUS PO LIQD
237.0000 mL | Freq: Three times a day (TID) | ORAL | Status: DC
  Administered 2015-10-27 – 2015-11-16 (×32): 237 mL via ORAL
  Filled 2015-10-27 (×103): qty 237

## 2015-10-27 MED ORDER — FLEET ENEMA 7-19 GM/118ML RE ENEM: 1.0000 | ENEMA | Freq: Once | RECTAL | Status: DC | PRN

## 2015-10-27 MED ORDER — GUAIFENESIN-DM 100-10 MG/5ML PO SYRP
5.0000 mL | ORAL_SOLUTION | Freq: Four times a day (QID) | ORAL | Status: DC | PRN
Start: 2015-10-27 — End: 2015-12-01

## 2015-10-27 MED ORDER — DEXTROSE 5 % IV SOLN: 1.0000 g | INTRAVENOUS | Status: DC

## 2015-10-27 MED ORDER — DIPHENHYDRAMINE HCL 12.5 MG/5ML PO ELIX
12.5000 mg | ORAL_SOLUTION | Freq: Four times a day (QID) | ORAL | Status: DC | PRN
  Administered 2015-11-02: 25 mg via ORAL
  Filled 2015-10-27: qty 10

## 2015-10-27 MED ORDER — ATORVASTATIN CALCIUM 40 MG PO TABS
40.0000 mg | ORAL_TABLET | Freq: Every day | ORAL | Status: DC
  Administered 2015-10-28 – 2015-11-30 (×34): 40 mg via ORAL
  Filled 2015-10-27 (×36): qty 1

## 2015-10-27 MED ORDER — QUETIAPINE FUMARATE 25 MG PO TABS: 12.5000 mg | ORAL_TABLET | Freq: Every day | ORAL | Status: DC

## 2015-10-27 MED ORDER — LIDOCAINE 5 % EX PTCH
2.0000 | MEDICATED_PATCH | CUTANEOUS | Status: DC
Start: 2015-10-27 — End: 2015-12-01
  Administered 2015-10-27 – 2015-11-10 (×12): 2 via TRANSDERMAL
  Filled 2015-10-27 (×24): qty 2

## 2015-10-27 MED ORDER — QUETIAPINE FUMARATE 25 MG PO TABS
12.5000 mg | ORAL_TABLET | Freq: Every day | ORAL | Status: DC
  Administered 2015-10-27 – 2015-11-30 (×35): 12.5 mg via ORAL
  Filled 2015-10-27 (×35): qty 1

## 2015-10-27 MED ORDER — POLYETHYLENE GLYCOL 3350 17 G PO PACK
17.0000 g | PACK | Freq: Two times a day (BID) | ORAL | Status: DC
  Administered 2015-10-27 – 2015-11-28 (×24): 17 g via ORAL
  Filled 2015-10-27 (×66): qty 1

## 2015-10-27 MED ORDER — PROCHLORPERAZINE EDISYLATE 5 MG/ML IJ SOLN: 5.0000 mg | Freq: Four times a day (QID) | INTRAMUSCULAR | Status: DC | PRN

## 2015-10-27 MED ORDER — DEXTROSE 5 % IV SOLN
1.0000 g | INTRAVENOUS | Status: AC
  Administered 2015-10-28: 1 g via INTRAVENOUS
  Filled 2015-10-27: qty 10

## 2015-10-27 MED ORDER — BISACODYL 10 MG RE SUPP: 10.0000 mg | Freq: Every day | RECTAL | Status: DC | PRN

## 2015-10-27 MED ORDER — CEPHALEXIN 500 MG PO CAPS
500.0000 mg | ORAL_CAPSULE | Freq: Four times a day (QID) | ORAL | Status: DC
Start: 2015-10-27 — End: 2015-12-01

## 2015-10-27 MED ORDER — ACETAMINOPHEN 325 MG PO TABS
325.0000 mg | ORAL_TABLET | ORAL | Status: DC | PRN
  Administered 2015-10-29 – 2015-11-05 (×5): 650 mg via ORAL
  Administered 2015-11-07: 325 mg via ORAL
  Administered 2015-11-11: 650 mg via ORAL
  Administered 2015-11-12: 325 mg via ORAL
  Filled 2015-10-27 (×9): qty 2

## 2015-10-27 MED ORDER — PROCHLORPERAZINE 25 MG RE SUPP: 12.5000 mg | Freq: Four times a day (QID) | RECTAL | Status: DC | PRN

## 2015-10-27 MED ORDER — CHLORHEXIDINE GLUCONATE 0.12 % MT SOLN
15.0000 mL | Freq: Two times a day (BID) | OROMUCOSAL | Status: DC
  Administered 2015-10-27 – 2015-11-09 (×19): 15 mL via OROMUCOSAL
  Filled 2015-10-27 (×23): qty 15

## 2015-10-27 MED ORDER — GERHARDT'S BUTT CREAM
TOPICAL_CREAM | Freq: Four times a day (QID) | CUTANEOUS | Status: DC
  Administered 2015-10-27 – 2015-11-01 (×16): via TOPICAL
  Administered 2015-11-01: 1 via TOPICAL
  Administered 2015-11-01 – 2015-11-03 (×8): via TOPICAL
  Administered 2015-11-03: 1 via TOPICAL
  Administered 2015-11-04 – 2015-11-27 (×72): via TOPICAL
  Administered 2015-11-29 (×3): 1 via TOPICAL
  Administered 2015-11-29 – 2015-12-01 (×6): via TOPICAL
  Filled 2015-10-27 (×2): qty 1

## 2015-10-27 MED ORDER — ALUM & MAG HYDROXIDE-SIMETH 200-200-20 MG/5ML PO SUSP: 30.0000 mL | ORAL | Status: DC | PRN

## 2015-10-27 MED ORDER — CETYLPYRIDINIUM CHLORIDE 0.05 % MT LIQD
7.0000 mL | Freq: Two times a day (BID) | OROMUCOSAL | Status: DC
  Administered 2015-10-28 – 2015-11-27 (×43): 7 mL via OROMUCOSAL

## 2015-10-27 MED ORDER — PROCHLORPERAZINE MALEATE 5 MG PO TABS: 5.0000 mg | ORAL_TABLET | Freq: Four times a day (QID) | ORAL | Status: DC | PRN

## 2015-10-27 MED ORDER — ACETAMINOPHEN 325 MG PO TABS
650.0000 mg | ORAL_TABLET | Freq: Four times a day (QID) | ORAL | Status: DC | PRN
  Administered 2015-10-28 – 2015-11-18 (×6): 650 mg via ORAL
  Filled 2015-10-27 (×6): qty 2

## 2015-10-27 MED ORDER — PANTOPRAZOLE SODIUM 40 MG PO TBEC
40.0000 mg | DELAYED_RELEASE_TABLET | Freq: Every day | ORAL | Status: DC
  Administered 2015-10-28 – 2015-12-01 (×35): 40 mg via ORAL
  Filled 2015-10-27 (×36): qty 1

## 2015-10-27 NOTE — Progress Notes (Signed)
I have insurance approval and will admit pt to inpt rehab today. RN CM and SW are aware. Pt and dtr in agreement. I will make the arrangements. [id number]

## 2015-10-27 NOTE — Progress Notes (Signed)
RT Note: NIF and VC completed at this time. Pt alert and orientated. Good effort. Family at bedside. NIF -28, -32 VC 1.64. 1.69 L

## 2015-10-27 NOTE — Discharge Summary (Signed)
Physician Discharge Summary  Cynthia [name] NOB:[id number] DOB: 20-Jan-1936 DOA: 10/06/2015  PCP: [name], MD  Admit date: 10/06/2015 Discharge date: 10/27/2015  Time spent: > 30 minutes  Recommendations for Outpatient Follow-up:  1. Discharge to Inpatient Rehab 2. Continue Keflex for 4 additional days for UTI. End date 10/31/15  3. Outpatient follow up with Neurology once d/c from CIR  Discharge Diagnoses:  Active Problems:   Colon cancer metastasized to liver [facility])   Peripheral neuropathy (HCC)   Paresthesia of both hands   Paresthesia of both feet   Cerebral embolism with cerebral infarction   Shock ([place])   Hemoptysis   Transverse myelitis ([place])   Acute respiratory failure (HCC)   Aspiration into airway   Endotracheally intubated   GBS (Guillain-Barre syndrome) ([place])  Discharge Condition: stable  Diet recommendation: as tolerated  Filed Weights   10/25/15 0427 10/26/15 0646 10/27/15 0414  Weight: 63.322 kg (139 lb 9.6 oz) 60.827 kg (134 lb 1.6 oz) 58.106 kg (128 lb 1.6 oz)    History of present illness:  Cynthia [name] is a 80 y.o. female with metastatic colon cancer, status post right colectomy and chemotherapy 2015. In December patient was found to have liver lesions, biopsy confirmed metastatic colon cancer. She underwent ablation of the liver lesions on the tenth of this month. Patient presented to the emergency department today with complaints of tingling in her right arm and leg which started last night. Hands and feet feel like they are asleep or "cold". She was very dizzy last night before bedtime and noticed a colorful floater in her left eye. This am patient was unable to standing secondary to poor balance and weakness of her lower extremities. Patient had a stroke a few years ago. She was left with some left-sided weakness but normally walks around independently. Patient has not started any new medications lately other than extra strength  Tylenol. She gives a history of B12 deficiency. Patient has been running temps near 100 at home, told by Radiation Onc to expect this.   Hospital Course:  GBS - neurology was consulted and have followed patient while hospitalized, she has received 5 day course of IVIG with subsequent improvement in her weakness. PT/OT consulted with recs for CIR. Due to immoblity she had a rectal tube and a Foley catheter. Able to discontinue rectal tube as well as Foley catheter on 3/9, however Foley catheter needed to be placed back again on 3/10 given ongoing urinary retention. Due to intubation status and weakness, she had a NG feeding tube which she self discontinued on 3/9. She fortunately she passed a swallow eval 3/10.  Hypoxic respiratory failure - Patient required intubation on PCCM service until 3/7, successfully extubated 3/6 and currently remains on room air.  Confusion / intermittent hallucinations / delirium - patient with intermittent hallucinations throughout her hospital stay, initially thought to be due to narcotics or gabapentin, these were discontinued. Possible new catheter associated UTI is contributing, she was started on ceftriaxone. She was started on Seroquel QHS. Eventually her delirium has resolved with above mentioned interventions, she is now AxOx4, able to participate in rehab adequately. Avoid benzodiazepines/narcotics/gabapentin. Patient has a history of agitation and did so while here to benzodiazepines. She was B12 deficient last year, received by mouth supplementation, her B12 looks somewhat improved, however got another B12 shot on 3/11. MMA level pending UTI - UA with strong evidence of infection, patient also symptomatic. She was empitically on Ceftriaxone, received 6 days while  hospitalized, her urine cultures with Citrobacter, will narrow to Keflex based on sensitivities and needs 4 additional days for 10 day course.  HTN - BP currently stable Acute blood loss anemia secondary  to upper gi bleed - Pt was seen by GI and underwent EGD on 3/1 with findings of tiny to small hiatal hernia and prox linear ulcer questionable from NG tube. Hgb stable at present HCAP - Completed course of abx while in ICU with Vancomycin and Zosyn, end date 3/4  Procedures:  EGD 3/1  Cortrak tube d/c 3/9   Consultations:  Neurology  PCCM  Psychiatry   Discharge Exam: Filed Vitals:   10/27/15 0007 10/27/15 0414 10/27/15 0848 10/27/15 1203  BP: 121/46 127/50 110/76 127/70  Pulse: 61  87 70  Temp: 98.4 F (36.9 C) 97.7 F (36.5 C) 97.8 F (36.6 C) 97.3 F (36.3 C)  TempSrc:  Axillary Oral Oral  Resp: 21 20    Height:      Weight:  58.106 kg (128 lb 1.6 oz)    SpO2: 97% 99% 97% 98%    General: MAD Cardiovascular: RRR Respiratory: CTA biL  Discharge Instructions Activity:  As tolerated   Get Medicines reviewed and adjusted: Please take all your medications with you for your next visit with your Primary MD  Please request your Primary MD to go over all hospital tests and procedure/radiological results at the follow up, please ask your Primary MD to get all Hospital records sent to his/her office.  If you experience worsening of your admission symptoms, develop shortness of breath, life threatening emergency, suicidal or homicidal thoughts you must seek medical attention immediately by calling 911 or calling your MD immediately if symptoms less severe.  You must read complete instructions/literature along with all the possible adverse reactions/side effects for all the Medicines you take and that have been prescribed to you. Take any new Medicines after you have completely understood and accpet all the possible adverse reactions/side effects.   Do not drive when taking Pain medications.   Do not take more than prescribed Pain, Sleep and Anxiety Medications  Special Instructions: If you have smoked or chewed Tobacco in the last 2 yrs please stop smoking, stop any  regular Alcohol and or any Recreational drug use.  Wear Seat belts while driving.  Please note  You were cared for by a hospitalist during your hospital stay. Once you are discharged, your primary care physician will handle any further medical issues. Please note that NO REFILLS for any discharge medications will be authorized once you are discharged, as it is imperative that you return to your primary care physician (or establish a relationship with a primary care physician if you do not have one) for your aftercare needs so that they can reassess your need for medications and monitor your lab values.    Medication List    TAKE these medications        acetaminophen 500 MG tablet  Commonly known as:  TYLENOL  Take 500 mg by mouth every 6 (six) hours as needed for mild pain.     aspirin EC 81 MG tablet  Take 1 tablet (81 mg total) by mouth daily.     atorvastatin 40 MG tablet  Commonly known as:  LIPITOR  Take 1 tablet (40 mg total) by mouth daily.     cephALEXin 500 MG capsule  Commonly known as:  KEFLEX  Take 1 capsule (500 mg total) by mouth 4 (four) times daily. For  4 days     metoprolol succinate 25 MG 24 hr tablet  Commonly known as:  TOPROL-XL  Take 37.5 mg by mouth every evening.     multivitamin with minerals Tabs tablet  Take 1 tablet by mouth every morning.     QUEtiapine 25 MG tablet  Commonly known as:  SEROQUEL  Take 0.5 tablets (12.5 mg total) by mouth at bedtime.       The results of significant diagnostics from this hospitalization (including imaging, microbiology, ancillary and laboratory) are listed below for reference.    Significant Diagnostic Studies: Dg Chest 2 View  10/06/2015  CLINICAL DATA:  Shortness of breath, dizziness EXAM: CHEST  2 VIEW COMPARISON:  02/22/2014 FINDINGS: Linear atelectasis or scarring in the right lower lung. Small right pleural effusion. Left lung is clear. Heart is normal size. No acute bony abnormality. IMPRESSION: Right  lower lung atelectasis with small right effusion. Electronically Signed   By: Rolm Baptise M.D.   On: 10/06/2015 09:19   Ct Head Wo Contrast  10/23/2015  CLINICAL DATA:  Altered mental status. EXAM: CT HEAD WITHOUT CONTRAST TECHNIQUE: Contiguous axial images were obtained from the base of the skull through the vertex without intravenous contrast. COMPARISON:  10/06/2015 FINDINGS: There is no evidence for acute hemorrhage, hydrocephalus, mass lesion, or abnormal extra-axial fluid collection. No definite CT evidence for acute infarction. Diffuse loss of parenchymal volume is consistent with atrophy. Patchy low attenuation in the deep hemispheric and periventricular white matter is nonspecific, but likely reflects chronic microvascular ischemic demyelination. The visualized paranasal sinuses and mastoid air cells are clear. IMPRESSION: Stable exam without acute intracranial abnormality. Atrophy with chronic small vessel white matter ischemic disease. Electronically Signed   By: Misty Stanley M.D.   On: 10/23/2015 21:13   Ct Head Wo Contrast  10/06/2015  CLINICAL DATA:  Acute onset of right-sided tingling and bilateral weakness. Initial encounter. EXAM: CT HEAD WITHOUT CONTRAST TECHNIQUE: Contiguous axial images were obtained from the base of the skull through the vertex without intravenous contrast. COMPARISON:  CT of the head performed 01/12/2015, and MRI/MRA of the brain performed 01/11/2015 FINDINGS: There is no evidence of acute infarction, mass lesion, or intra- or extra-axial hemorrhage on CT. Mild periventricular white matter change likely reflects small vessel ischemic microangiopathy. Prominence of the ventricles and sulci suggest mild cortical volume loss. The posterior fossa, including the cerebellum, brainstem and fourth ventricle, is within normal limits. The third and lateral ventricles, and basal ganglia are unremarkable in appearance. The cerebral hemispheres demonstrate grossly normal gray-white  differentiation. No mass effect or midline shift is seen. There is no evidence of fracture; visualized osseous structures are unremarkable in appearance. The visualized portions of the orbits are within normal limits. The paranasal sinuses and mastoid air cells are well-aerated. No significant soft tissue abnormalities are seen. IMPRESSION: 1. No acute intracranial pathology seen on CT. 2. Mild cortical volume loss and scattered small vessel ischemic microangiopathy. Electronically Signed   By: Garald Balding M.D.   On: 10/06/2015 06:32   Mr Brain Wo Contrast  10/06/2015  CLINICAL DATA:  Ataxia. EXAM: MRI HEAD WITHOUT CONTRAST TECHNIQUE: Multiplanar, multiecho pulse sequences of the brain and surrounding structures were obtained without intravenous contrast. COMPARISON:  CT head 10/06/2015 FINDINGS: Negative for acute infarct. Mild to moderate chronic microvascular ischemic change in the white matter. Chronic ischemia in the basal ganglia and thalamus bilaterally. Mild chronic ischemia in the pons. Age-appropriate atrophy.  Negative for hydrocephalus Negative for intracranial hemorrhage.  Negative for mass or edema Paranasal sinuses clear.  Pituitary not enlarged.  Normal skullbase. IMPRESSION: Atrophy and chronic microvascular ischemia No acute abnormality. Electronically Signed   By: Franchot Gallo M.D.   On: 10/06/2015 14:01   Mr Cervical Spine W Wo Contrast  10/08/2015  CLINICAL DATA:  80 year old female with ataxia, loss of balance and lower extremity weakness with bilateral upper and lower extremity paresthesia, acute onset. Metastatic colon cancer. Subsequent encounter. EXAM: MRI CERVICAL SPINE WITHOUT AND WITH CONTRAST TECHNIQUE: Multiplanar and multiecho pulse sequences of the cervical spine, to include the craniocervical junction and cervicothoracic junction, were obtained according to standard protocol without and with intravenous contrast. CONTRAST:  43m MULTIHANCE GADOBENATE DIMEGLUMINE 529 MG/ML  IV SOLN COMPARISON:  Brain MRI 10/06/2015.  PET-CT 07/28/2015 FINDINGS: Straightening of cervical lordosis. Multilevel cervical degenerative endplate changes. There is trace superior endplate marrow edema anteriorly at C7 which appears degenerative in nature. No marrow lesion in the cervical spine suspicious for metastatic disease. Visible bone marrow signal at the skullbase appears stable and within normal limits. Cervicomedullary junction is within normal limits. Negative visualized posterior fossa structures. Spinal cord signal is within normal limits at all visualized levels. No abnormal cervical intradural enhancement. Negative paraspinal soft tissues. Cervical spine degenerative changes: C2-C3: Mild to moderate facet hypertrophy greater on the right. Borderline to mild right C3 foraminal stenosis. C3-C4: Disc space loss with circumferential disc osteophyte complex eccentric to the right. Broad-based posterior component of disc. Narrowing of the ventral CSF space without spinal stenosis. Moderate to severe uncovertebral and facet hypertrophy on the right. Mild left and severe right C4 foraminal stenosis. C4-C5: Trace anterolisthesis with moderate to severe facet hypertrophy greater on the right. Mild disc bulge. No spinal stenosis. Moderate right C5 foraminal stenosis. C5-C6: Disc space loss. Circumferential disc osteophyte complex broad-based posterior component of disc. Mild ligament flavum hypertrophy. Borderline to mild spinal stenosis. Uncovertebral hypertrophy with moderate bilateral C6 foraminal stenosis. C6-C7: Disc space loss. Circumferential disc osteophyte complex. Broad-based posterior component of disc eccentric to the left. Uncovertebral hypertrophy greater on the left. Mild ligament flavum hypertrophy. Borderline to mild spinal stenosis. Severe left C7 foraminal stenosis. C7-T1: Moderate facet hypertrophy.  No stenosis. No upper thoracic spinal stenosis. IMPRESSION: 1. No metastatic disease  identified in the cervical spine. Mild degenerative appearing endplate marrow edema at C7. 2. Multilevel disc, endplate, and facet degeneration in the cervical spine. 3. Subsequent borderline to mild spinal stenosis at C5-C6 and C6-C7 with no spinal cord mass effect or signal abnormality. 4. Subsequent moderate or severe neural foraminal stenosis at the right C4, right C5, bilateral C6, and left C7 nerve levels. Electronically Signed   By: HGenevie AnnM.D.   On: 10/08/2015 17:39   Mr Thoracic Spine W Wo Contrast  10/11/2015  CLINICAL DATA:  Colon cancer. Progressive lower extremity weakness. New onset of upper extremity motor weakness. Height but reflexia of the upper extremities has progressed. The lower extremities are without reflexes. Decreased tone. Question Guillain-Barre syndrome. EXAM: MRI THORACIC AND LUMBAR SPINE WITHOUT AND WITH CONTRAST TECHNIQUE: Multiplanar and multiecho pulse sequences of the thoracic and lumbar spine were obtained without and with intravenous contrast. CONTRAST:  125mMULTIHANCE GADOBENATE DIMEGLUMINE 529 MG/ML IV SOLN COMPARISON:  MRI of the brain 10/06/2015. MRI of the cervical spine 10/08/2015. FINDINGS: MR THORACIC SPINE FINDINGS Normal signal is present throughout the thoracic spinal cord. The conus medullaris terminates at L1, within normal limits. Heterogeneous marrow signal is present. There is no focal enhancement to  suggest metastatic disease. Posterior hemangiomas are present at T6 and T7. Vertebral body heights are maintained throughout the thoracic spine. Bilateral pleural effusions are present, right greater than left. There is associated volume loss and atelectasis. Mild leftward curvature is present in the upper thoracic spine, centered at T5. No significant focal disc protrusion is present within the thoracic spine. Asymmetric left-sided facet hypertrophy is present at T9-10 results in mild left foraminal stenosis. The foramina are otherwise patent bilaterally. There  is some enhancement about the distal spinal cord and particularly within the nerve roots. MR LUMBAR SPINE FINDINGS Normal signal is present in the conus medullaris which terminates at L2. A prominent hemangioma is noted posteriorly at the L1 level. There is chronic fatty endplate marrow change at L5-S1. Limited imaging of the abdomen demonstrates atherosclerotic changes in the aorta without aneurysm. Prominent hepatic cysts are again noted. L1-2: Mild facet hypertrophy is noted bilaterally without significant stenosis. L2-3: Mild facet hypertrophy is present bilaterally. There is mild disc bulging. There is no significant stenosis. L3-4: A broad-based disc protrusion is present. Mild facet hypertrophy is evident bilaterally. A left laminectomy is noted. There is no residual or recurrent stenosis. L4-5: A left laminectomy is noted. Mild facet hypertrophy is present bilaterally. A broad-based disc protrusion is worse on the right. This results in mild subarticular and foraminal narrowing bilaterally. L5-S1: A left laminectomy is noted. Mild facet hypertrophy is worse on the left. Mild foraminal narrowing is also worse on the left. Both anterior and posterior nerve roots demonstrate enhancement without significant enlargement. IMPRESSION: 1. No evidence for metastatic disease to the thoracic or lumbar spine. 2. Peripheral enhancement of the distal thoracic spinal cord is well is the nerve roots with anterior and posterior suggesting Guillain-Barre. No discrete mass lesion is present. Other inflammatory or autoimmune process ease or metastatic disease are considered less likely given the uniformity and lack of significant nerve root enlargement. 3. Left laminectomies at L3-4, L4-5, and L5-S1. 4. Mild foraminal narrowing bilaterally at L4-5 and L5-S1. 5. Mild subarticular narrowing bilaterally at L4-5 despite laminectomy. 6. Mild left foraminal narrowing at T9-10 due to facet disease. Electronically Signed   By:  San Morelle M.D.   On: 10/11/2015 17:35   Mr Lumbar Spine W Wo Contrast  10/11/2015  : Please see lumbar spine report by Dr. Jobe Igo, under accession #[id number] Electronically Signed   By: Van Clines M.D.   On: 10/11/2015 17:52   Dg Chest Port 1 View  10/20/2015  CLINICAL DATA:  Guillain-Barre syndrome. EXAM: PORTABLE CHEST 1 VIEW COMPARISON:  10/19/2015. FINDINGS: Right IJ sheath and feeding tube in stable position. Mediastinum and hilar structures are stable. Interim improvement of bibasilar atelectasis. Small bilateral pleural effusions. No pneumothorax. IMPRESSION: 1. Lines and tubes in stable position. 2. Interim partial clearing of bibasilar atelectasis. Small bilateral pleural effusions are again noted . Electronically Signed   By: Marcello Moores  Register   On: 10/20/2015 07:49   Dg Chest Port 1 View  10/19/2015  CLINICAL DATA:  Respiratory failure. EXAM: PORTABLE CHEST 1 VIEW COMPARISON:  10/18/2015. FINDINGS: Interim extubation. Right IJ sheath and feeding tube in stable position. Low lung volumes. Interim partial clearing of left base atelectasis and/or infiltrate. Mild right base and right mid lung field subsegmental atelectasis. Persistent small left pleural effusion. Small left pleural effusion. No pneumothorax. IMPRESSION: 1. Interim extubation. Right IJ sheath and feeding tube in stable position. 2. Low lung volumes. Interim partial clearing of left base atelectasis and/or infiltrate. Mild right  base and right mid lung field subsegmental atelectasis. Persistent small left pleural effusion. Continued follow-up exam suggested to demonstrate clearing to exclude underlying focal lesion. Electronically Signed   By: Marcello Moores  Register   On: 10/19/2015 07:19   Dg Chest Port 1 View  10/18/2015  CLINICAL DATA:  Aspiration, history hypertension, colon cancer EXAM: PORTABLE CHEST 1 VIEW COMPARISON:  Portable exam 0522 hours compared to 10/17/2015 FINDINGS: Tip of endotracheal tube projects 4.6  cm above carina. Feeding tube extends into stomach. RIGHT jugular central venous catheter tip projects over confluence of the SVC. Normal heart size, mediastinal contours and pulmonary vascularity. Atherosclerotic calcification aorta. Persistent LEFT lower lobe atelectasis versus consolidation with associated small LEFT pleural effusion. Lungs emphysematous but otherwise clear. No pneumothorax. Bones demineralized. IMPRESSION: Persistent LEFT lower lobe atelectasis versus consolidation with small LEFT pleural effusion. When compared to the previous exam, little change. Electronically Signed   By: Lavonia Dana M.D.   On: 10/18/2015 07:18   Dg Chest Port 1 View  10/17/2015  CLINICAL DATA:  Respiratory failure EXAM: PORTABLE CHEST 1 VIEW COMPARISON:  October 16, 2015 FINDINGS: There is increased new opacity in the left retrocardiac region with shift of the heart and mediastinum to the left. The shift in the of the heart and mediastinum suggests volume loss on the left, possibly atelectasis. No pneumothorax. No other change in the cardiomediastinal silhouette. The ET tube is in good position in the mid trachea. A feeding tube terminates below today's film. A right IJ sheath remains. No other interval changes or acute abnormalities. IMPRESSION: 1. There is new opacity in the left base obscuring the left hemidiaphragm. There is also a shift of the heart and mediastinum to the left suggesting at least a component of this opacity is due to atelectasis. Superimposed infiltrate is not excluded. Recommend attention on follow-up. 2. No other interval changes or acute abnormalities. Electronically Signed   By: Dorise Bullion III M.D   On: 10/17/2015 07:12   Dg Chest Port 1 View  10/16/2015  CLINICAL DATA:  Respiratory failure. EXAM: PORTABLE CHEST 1 VIEW COMPARISON:  10/15/2015. FINDINGS: Endotracheal tube, right IJ line, feeding tube in stable position. Mediastinum hilar structures normal. Low lung volumes with bibasilar  atelectasis and/or infiltrates, partial improvement from prior exam. Small left pleural effusion. No pneumothorax . IMPRESSION: 1. Lines and tubes in stable position. 2. Low lung volumes with persistent but significantly improved bibasilar atelectasis and/or infiltrates. Small left pleural effusion . Electronically Signed   By: Marcello Moores  Register   On: 10/16/2015 07:41   Dg Chest Port 1 View  10/15/2015  CLINICAL DATA:  Respiratory failure. EXAM: PORTABLE CHEST 1 VIEW COMPARISON:  10/14/2015. FINDINGS: Endotracheal tube, feeding tube, right IJ sheath in stable position. Stable mild cardiomegaly. No pulmonary venous congestion. Persistent left lower lobe atelectasis and consolidation. Persistent right base subsegmental atelectasis. Small left pleural effusion. IMPRESSION: 1. Lines and tubes in stable position. 2. Persistent dense left lower lobe atelectasis and consolidation. Persistent right base subsegmental atelectasis. Persistent small left pleural effusion . Electronically Signed   By: Marcello Moores  Register   On: 10/15/2015 07:38   Dg Chest Port 1 View  10/14/2015  CLINICAL DATA:  Followup respiratory failure EXAM: PORTABLE CHEST 1 VIEW COMPARISON:  10/13/2015 FINDINGS: Endotracheal tube, nasogastric catheter and right jugular sheath are again seen and stable. The cardiac shadow is stable. Increased density is noted in the left lung base but slightly increased from the previous exam. Mild right basilar atelectasis remains. No  bony abnormality is seen. IMPRESSION: Bibasilar changes with slight progression on the left. Electronically Signed   By: Inez Catalina M.D.   On: 10/14/2015 07:51   Dg Chest Port 1 View  10/13/2015  CLINICAL DATA:  Respiratory failure EXAM: PORTABLE CHEST 1 VIEW COMPARISON:  10/12/2015 FINDINGS: Endotracheal tube and nasogastric catheter are again seen and stable. A right jugular central the sheath is noted and stable. Small bilateral pleural effusions are again identified. Slight increased  progression in the degree of left lower lobe consolidation is noted IMPRESSION: Small bilateral pleural effusions. Tubes and lines as described above. Progressive consolidation in the left lower lobe. Electronically Signed   By: Inez Catalina M.D.   On: 10/13/2015 07:43   Portable Chest Xray  10/12/2015  CLINICAL DATA:  Intubation . EXAM: PORTABLE CHEST 1 VIEW COMPARISON:  10/11/2015. FINDINGS: Endotracheal tube, right IJ central line, NG tube in stable position. Cardiomegaly with normal pulmonary vascularity. Low lung volumes with basilar atelectasis and/or infiltrates. Small bilateral pleural effusions. IMPRESSION: 1. Lines and tubes in stable position. 2. Low lung volumes with bibasilar atelectasis and/or infiltrates. Small bilateral pleural effusions. Electronically Signed   By: Marcello Moores  Register   On: 10/12/2015 07:37   Portable Chest Xray  10/11/2015  CLINICAL DATA:  Acute respiratory failure -endotracheal tube placement. EXAM: PORTABLE CHEST 1 VIEW COMPARISON:  10/09/2015 and prior exams FINDINGS: Endotracheal tube with tip 4.5 cm above carina is now noted. NG tube with tip overlying the mid stomach and right IJ central venous catheter sheath again noted. Bilateral lower lung opacities are noted. Trace bilateral pleural effusions are present. There is no evidence of pneumothorax. The cardiomediastinal silhouette is unchanged. IMPRESSION: Endotracheal tube with tip 4.5 cm above carina. Little significant change in bilateral lower lung atelectasis/ airspace disease with trace bilateral pleural effusions. Electronically Signed   By: Margarette Canada M.D.   On: 10/11/2015 11:16   Dg Chest Port 1 View  10/09/2015  CLINICAL DATA:  Central catheter placement EXAM: PORTABLE CHEST 1 VIEW COMPARISON:  October 09, 2015 study obtained earlier in the day FINDINGS: Central catheter tip is in the superior vena cava. Nasogastric tube tip and side port are below the diaphragm with the side port seen in the stomach. No  pneumothorax. There is atelectatic change in the bases, slightly more on the left than on the right. Lungs elsewhere clear. Heart size and pulmonary vascularity are normal. No adenopathy. No bone lesions. IMPRESSION: Tube and catheter positions as described without pneumothorax. Bibasilar atelectasis. No consolidation. No change in cardiac silhouette. Electronically Signed   By: Lowella Grip III M.D.   On: 10/09/2015 12:43   Dg Chest Port 1 View  10/09/2015  CLINICAL DATA:  Hemoptysis EXAM: PORTABLE CHEST 1 VIEW COMPARISON:  10/06/2015 FINDINGS: Cardiomediastinal silhouette is stable. Elevation of the right hemidiaphragm again noted. Again noted triangular shape atelectasis in right middle lobe/ infrahilar region. There is new linear atelectasis left base medially. No segmental infiltrate or pulmonary edema. IMPRESSION: Elevation of the right hemidiaphragm again noted. Again noted triangular shape atelectasis in right middle lobe/ infrahilar region. There is new linear atelectasis left base medially. No segmental infiltrate or pulmonary edema. Electronically Signed   By: Lahoma Crocker M.D.   On: 10/09/2015 10:24   Dg Abd Portable 1v  10/20/2015  CLINICAL DATA:  Encounter for feeding tube placement. EXAM: PORTABLE ABDOMEN - 1 VIEW COMPARISON:  October 14, 2015. FINDINGS: The bowel gas pattern is normal. Distal tip of feeding tube is  seen in expected position of distal stomach or first portion of duodenum. IMPRESSION: Distal tip of feeding tube seen in expected position of distal stomach or first portion of duodenum. Electronically Signed   By: Marijo Conception, M.D.   On: 10/20/2015 14:00   Dg Abd Portable 1v  10/14/2015  CLINICAL DATA:  NG tube placement EXAM: PORTABLE ABDOMEN - 1 VIEW COMPARISON:  None FINDINGS: Feeding tube is in place with the tip in the peripyloric region. Nonobstructive bowel gas pattern. IMPRESSION: Feeding tube tip in the peripyloric region. Electronically Signed   By: Rolm Baptise M.D.    On: 10/14/2015 12:39   Dg Swallowing Func-speech Pathology  10/23/2015  Objective Swallowing Evaluation: Type of Study: MBS-Modified Barium Swallow Study Patient Details Name: Cynthia [name] MRN: [id number] Date of Birth: 1935-10-21 Today's Date: 10/23/2015 Time: SLP Start Time (ACUTE ONLY): 1030-SLP Stop Time (ACUTE ONLY): 1100 SLP Time Calculation (min) (ACUTE ONLY): 30 min Past Medical History: Past Medical History Diagnosis Date . Anemia  . Blood transfusion without reported diagnosis jan 2015 . Heart murmur  . Hypertension    no bp meds  . Liver cyst  . C. difficile colitis 03/05/14, 03/22/14, 04/05/14   recurrent . Stroke ([place]) 12/2014   NUMBNESS ON LEFT SIDE . Cervical cancer ([place]) 1972 . Colon cancer ([place]) JAN 2015 . Colon cancer ([place])  . Cancer ([place])    liver . Complication of anesthesia    slow to awaken in past, DONE WELL RECENTLY . Tinnitus of both ears ALL THE TIME . DVT (deep venous thrombosis) ([place]) 10/24/13   Right leg Past Surgical History: Past Surgical History Procedure Laterality Date . Back surgery  1981   lower lumbar . Tubal ligation  1968 . Laparoscopic right colectomy Right 10/22/2013   Procedure: LAPAROSCOPIC RIGHT COLECTOMY ;  Surgeon: [name], MD;  Location: [facility];  Service: General;  Laterality: Right; . Salpingoophorectomy  10/22/2013   Procedure: [place] Saa;  Surgeon: [name]. [name], MD;  Location: [facility];  Service: Gynecology;; . Abdominal hysterectomy     vaginal- partial . Esophagogastroduodenoscopy N/A 10/14/2015   Procedure: ESOPHAGOGASTRODUODENOSCOPY (EGD);  Surgeon: Clarene Essex, MD;  Location: [facility] ENDOSCOPY;  Service: Endoscopy;  Laterality: N/A;  Bedside in ICU HPI: YARIELA TISON is an 80 y.o. female patient presented with paresthesia in UE/LE. 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 Pt developed coffee ground emesis and hypotension - acute  UGI bleed on 2/24.  Pt was intubated from 10/11/15 until 10/18/15. She has hx of finding of chronic right  basal ganglia CVA in May 2016 (BSE done, pt normal), metastatic colon cancer s/p Rt colectomy.  No Data Recorded Assessment / Plan / Recommendation CHL IP CLINICAL IMPRESSIONS 10/23/2015 Therapy Diagnosis Moderate pharyngeal phase dysphagia Clinical Impression pt continue to present with a moderate oropharyngeal dysphagia characterized by delayed swallow initaition leading to aspiration before the swallow with sensation but insufficient cough. If bolus size is reduced to teaspoon size and pt is instructed to hold the bolus, she can eliminate aspriation. She was also successful with large sips fo thin with a straw with a complete chin tuck. Risk of aspiration is moderate given weak muscles of respiration and occasional confusion. She would benefit from initiating a Respiratory Muscle Strength Training program.  Pt will need full supervision for reminders for a chin tuck. She may initaite a regular diet and thin  liquids if these conditions are being met.  Impact on safety and function Moderate aspiration risk   CHL IP TREATMENT RECOMMENDATION 10/23/2015 Treatment Recommendations Therapy as outlined in treatment plan below   Prognosis 10/23/2015 Prognosis for Safe Diet Advancement Good Barriers to Reach Goals -- Barriers/Prognosis Comment -- CHL IP DIET RECOMMENDATION 10/23/2015 SLP Diet Recommendations Regular solids;Thin liquid Liquid Administration via Straw Medication Administration Whole meds with puree Compensations Chin tuck Postural Changes Seated upright at 90 degrees   CHL IP OTHER RECOMMENDATIONS 10/23/2015 Recommended Consults -- Oral Care Recommendations Oral care BID Other Recommendations --   CHL IP FOLLOW UP RECOMMENDATIONS 10/23/2015 Follow up Recommendations Inpatient Rehab   CHL IP FREQUENCY AND DURATION 10/23/2015 Speech Therapy Frequency (ACUTE ONLY) min 2x/week Treatment Duration 2 weeks      CHL IP  ORAL PHASE 10/23/2015 Oral Phase WFL Oral - Pudding Teaspoon -- Oral - Pudding Cup -- Oral - Honey Teaspoon -- Oral - Honey Cup -- Oral - Nectar Teaspoon -- Oral - Nectar Cup -- Oral - Nectar Straw -- Oral - Thin Teaspoon -- Oral - Thin Cup -- Oral - Thin Straw -- Oral - Puree -- Oral - Mech Soft -- Oral - Regular -- Oral - Multi-Consistency -- Oral - Pill -- Oral Phase - Comment --  CHL IP PHARYNGEAL PHASE 10/23/2015 Pharyngeal Phase Impaired Pharyngeal- Pudding Teaspoon -- Pharyngeal -- Pharyngeal- Pudding Cup -- Pharyngeal -- Pharyngeal- Honey Teaspoon -- Pharyngeal -- Pharyngeal- Honey Cup -- Pharyngeal -- Pharyngeal- Nectar Teaspoon Delayed swallow initiation-vallecula Pharyngeal -- Pharyngeal- Nectar Cup Penetration/Aspiration before swallow;Significant aspiration (Amount) Pharyngeal Material enters airway, passes BELOW cords and not ejected out despite cough attempt by patient Pharyngeal- Nectar Straw -- Pharyngeal -- Pharyngeal- Thin Teaspoon -- Pharyngeal -- Pharyngeal- Thin Cup -- Pharyngeal -- Pharyngeal- Thin Straw Delayed swallow initiation-vallecula;Delayed swallow initiation-pyriform sinuses;Compensatory strategies attempted (with notebox) Pharyngeal -- Pharyngeal- Puree Delayed swallow initiation-vallecula Pharyngeal Material does not enter airway Pharyngeal- Mechanical Soft -- Pharyngeal -- Pharyngeal- Regular Delayed swallow initiation-vallecula Pharyngeal -- Pharyngeal- Multi-consistency -- Pharyngeal -- Pharyngeal- Pill Delayed swallow initiation-vallecula Pharyngeal -- Pharyngeal Comment --  No flowsheet data found. No flowsheet data found. Herbie [place], [place] CCC-SLP [phone] Lynann Beaver 10/23/2015, 11:18 AM              Mr Jodene Nam Head/brain Wo Cm  10/07/2015  CLINICAL DATA:  Gait ataxia, numbness, and paresthesias. EXAM: MRA HEAD WITHOUT CONTRAST TECHNIQUE: Angiographic images of the Circle of Willis were obtained using MRA technique without intravenous contrast. COMPARISON:   01/11/2015 FINDINGS: The study is mildly motion degraded. The visualized distal vertebral arteries are patent with the right being dominant. PICA, AICA, and SCA origins are patent. Basilar artery is patent without stenosis. Posterior communicating arteries are not identified. PCAs are patent without evidence of significant proximal stenosis, although there is right greater than left left PCA branch vessel irregularity and attenuation. This is less prominent than on the prior study. The distal left cervical ICA is tortuous. Intracranial ICAs are patent without evidence of significant stenosis. ACAs and MCAs are patent without evidence of significant proximal stenosis or major branch occlusion. Moderate anterior circulation branch vessel irregularity is again seen. No intracranial aneurysm is identified. IMPRESSION: 1. No evidence of large vessel occlusion or significant proximal stenosis. 2. Moderate branch vessel irregularity suggestive of atherosclerosis. Electronically Signed   By: Logan Bores M.D.   On: 10/07/2015 11:32   Microbiology: Recent Results (from the past 240 hour(s))  Culture, Urine     Status:  None   Collection Time: 10/22/15  9:19 AM  Result Value Ref Range Status   Specimen Description URINE, RANDOM  Final   Special Requests NONE  Final   Culture >=100,000 COLONIES/mL CITROBACTER KOSERI  Final   Report Status 10/24/2015 FINAL  Final   Organism ID, Bacteria CITROBACTER KOSERI  Final      Susceptibility   Citrobacter koseri - MIC*    CEFAZOLIN <=4 SENSITIVE Sensitive     CEFTRIAXONE <=1 SENSITIVE Sensitive     CIPROFLOXACIN <=0.25 SENSITIVE Sensitive     GENTAMICIN <=1 SENSITIVE Sensitive     IMIPENEM <=0.25 SENSITIVE Sensitive     NITROFURANTOIN 32 SENSITIVE Sensitive     TRIMETH/SULFA <=20 SENSITIVE Sensitive     PIP/TAZO 16 SENSITIVE Sensitive     * >=100,000 COLONIES/mL CITROBACTER KOSERI   Labs: Basic Metabolic Panel:  Recent Labs Lab 10/21/15 0400 10/23/15 0357  10/24/15 0547 10/25/15 0801 10/27/15 0458  NA 133* 132* 135 134* 137  K 4.3 4.2 4.2 4.2 3.8  CL 103 99* 101 99* 103  CO2 _0 GLUCOSE 108* 113* 118* 106* 100*  BUN 20 23* 22* 16 19  CREATININE 0.46 0.45 0.51 0.49 0.62  CALCIUM 8.1* 8.6* 8.7* 8.7* 8.7*   Liver Function Tests:  Recent Labs Lab 10/25/15 0801  AST 32  ALT 33  ALKPHOS 46  BILITOT 1.0  PROT 6.6  ALBUMIN 2.2*   CBC:  Recent Labs Lab 10/21/15 0400 10/23/15 0357 10/24/15 0547 10/25/15 0801 10/27/15 0458  WBC 10.0 12.5* 10.7* 7.7 6.0  HGB 9.5* 10.3* 10.0* 10.3* 10.1*  HCT 29.9* 32.7* 31.8* 32.9* 32.3*  MCV 89.8 89.6 90.1 90.1 91.2  PLT 265 404* 459* 455* 438*   CBG:  Recent Labs Lab 10/26/15 1143 10/26/15 1732 10/26/15 2101 10/27/15 0740 10/27/15 1126  GLUCAP 128* 108* 93 99 102*    Signed:  Tonyia Marschall  [facility] 10/27/2015, 1:41 PM

## 2015-10-27 NOTE — Care Management Important Message (Signed)
Important Message  Patient Details  Name: Cynthia [name] MRN: [id number] Date of Birth: 1936/01/11   Medicare Important Message Given:  Yes    Harsha Yusko Abena 10/27/2015, 11:25 AM

## 2015-10-27 NOTE — Evaluation (Signed)
Occupational Therapy Evaluation Patient Details Name: Cynthia [name] MRN: [id number] DOB: May 19, 1936 Today's Date: 10/27/2015    History of Present Illness Cynthia [name] is a 80 y.o. female with metastatic colon cancer, status post right colectomy and chemotherapy 2015. In December patient was found to have liver lesions, biopsy confirmed metastatic colon cancer. She underwent ablation of the liver lesions on the tenth of last month.  Was found to have GBS and was intubated 2/26-10/18/15.   Clinical Impression   Patient presenting with decreased ADL and functional mobility independence secondary to above. Patient independent PTA. Patient currently functioning at an overall max/total to total assist +2 level, using maxi move for transfers OOB. Patient will benefit from acute OT to increase overall independence in the areas of ADLs, education on BUE functional strengthening HEP, functional mobility, and overall safety in order to safely discharge to venue listed below.     Follow Up Recommendations  CIR;Supervision/Assistance - 24 hour    Equipment Recommendations  Other (comment) (TBD next venue of care)    Recommendations for Other Services Rehab consult   Precautions / Restrictions Precautions Precautions: Fall Restrictions Weight Bearing Restrictions: No    Mobility Bed Mobility General bed mobility comments: Pt found seated in recliner after nursing staff assisted with getting her OOB  Transfers General transfer comment: Did not occur, maxi move used to transfer patient by nursing staff prior to OT entering room    Balance Overall balance assessment: Needs assistance Sitting-balance support: Bilateral upper extremity supported;Feet supported Sitting balance-Leahy Scale: Zero Standing balance comment: unable due to paralysis       ADL Overall ADL's : Needs assistance/impaired Eating/Feeding: Maximal assistance;Total assistance;Bed level Eating/Feeding Details  (indicate cue type and reason): hand over hand assist until pt too tired to perform Grooming: Maximal assistance;Total assistance;Wash/dry hands;Wash/dry face;Oral care;Brushing hair Grooming Details (indicate cue type and reason): hand over hand assist until pt too tired to perform Upper Body Bathing: Total assistance;Sitting Upper Body Bathing Details (indicate cue type and reason): supported in recliner, recommending bed level ADL at this time Lower Body Bathing: Total assistance;+2 for physical assistance;+2 for safety/equipment;Sitting/lateral leans Lower Body Bathing Details (indicate cue type and reason): supported in recliner, recommending bed level ADL at this time Upper Body Dressing : Total assistance;Sitting Upper Body Dressing Details (indicate cue type and reason): supported in recliner, recommending bed level ADL at this time Lower Body Dressing: Total assistance;+2 for physical assistance;+2 for safety/equipment;Sitting/lateral leans Lower Body Dressing Details (indicate cue type and reason): supported in recliner, recommending bed level ADL at this time   Toilet Transfer Details (indicate cue type and reason): did not occur, pt requires lift transfer for OOB, recommend bedpan a this time Toileting- Clothing Manipulation and Hygiene: Total assistance;Bed level;+2 for safety/equipment     Tub/Shower Transfer Details (indicate cue type and reason): did not occur, pt requires lift transfer for OOB   General ADL Comments: Pt found seated in recliner after nursing staff assisted pt into recliner via lift transfer. At this time, recommend pt complete bed level ADL for patient and staff safety. Recommend bedpan at this time as well.     Vision  No change from baseline          Pertinent Vitals/Pain Pain Assessment: Faces Faces Pain Scale: Hurts little more Pain Location: BLEs during movement and BUEs during movement  Pain Descriptors / Indicators: Grimacing;Sore Pain  Intervention(s): Monitored during session;Repositioned;Relaxation    Hand Dominance Right   Extremity/Trunk Assessment Upper Extremity  Assessment Upper Extremity Assessment: Generalized weakness RUE Deficits / Details: AAROM WFL (minimal pain when flexing shoulder to 180*-full range), strength shoulder flexion 2+/5, elbow flexion 3/5, elbow extension 2/5; numbness and tingling fingers to elbow; poor control with movements, i.e. hit herself when flexing elbows RUE Sensation: decreased light touch RUE Coordination: decreased gross motor;decreased fine motor LUE Deficits / Details: AAROM WFL (minimal pain when flexing shoulder to 180*-full range), strength shoulder flexion 2+/5, elbow flexion 3/5, elbow extension 2/5; numbness and tingling fingers to elbow; poor control with movements, i.e. hit herself when flexing elbows LUE Sensation: decreased light touch LUE Coordination: decreased gross motor;decreased fine motor   Lower Extremity Assessment Lower Extremity Assessment: Defer to PT evaluation   Cervical / Trunk Assessment Cervical / Trunk Assessment: Other exceptions Cervical / Trunk Exceptions: hx of back sx   Communication Communication Communication: No difficulties   Cognition Arousal/Alertness: Awake/alert Behavior During Therapy: WFL for tasks assessed/performed Overall Cognitive Status: Within Functional Limits for tasks assessed (a little slow to process)      Exercises Shoulder shrugs; AROM, X10 Shoulder retraction; AROM, X10 Finger flexion/extension for grip & release; AROM, X10        Home Living Family/patient expects to be discharged to:: Inpatient rehab Living Arrangements: Children (daughter) Available Help at Discharge: Family;Available 24 hours/day (between daughter and family members) Type of Home: House Home Access: Stairs to enter [employer] of Steps: 1 Entrance Stairs-Rails: None Home Layout: One level     Bathroom Shower/Tub: Tub/shower  unit Shower/tub characteristics: Architectural technologist: Standard Bathroom Accessibility: Yes How Accessible: Accessible via walker Home Equipment: [place] - 2 wheels;Walker - standard;Cane - quad;Crutches;Bedside commode;Shower seat;Grab bars - tub/shower   Prior Functioning/Environment Level of Independence: Independent     OT Diagnosis: Generalized weakness;Acute pain   OT Problem List: Decreased strength;Decreased range of motion;Decreased activity tolerance;Impaired balance (sitting and/or standing);Decreased safety awareness;Decreased knowledge of use of DME or AE;Decreased knowledge of precautions;Pain;Decreased coordination;Impaired sensation;Impaired UE functional use;Impaired tone   OT Treatment/Interventions: Self-care/ADL training;Therapeutic exercise;Energy conservation;DME and/or AE instruction;Therapeutic activities;Patient/family education;Balance training    OT Goals(Current goals can be found in the care plan section) Acute Rehab OT Goals - *NEW GOALS*  Patient Stated Goal: go to rehab OT Goal Formulation: With patient Time For Goal Achievement: 11/10/15 Potential to Achieve Goals: Good ADL Goals Pt Will Perform Grooming: with mod assist;with adaptive equipment;sitting Pt Will Transfer to Toilet: with max assist;with transfer board;bedside commode Pt/caregiver will Perform Home Exercise Program: Increased ROM;Increased strength;Both right and left upper extremity;With written HEP provided;With minimal assist Additional ADL Goal #1: Pt will engage in bed mobility with min assist as a precursor for ADL and decrease burden of care Additional ADL Goal #2: Pt will be able to tolerate sitting in bed side chair for at least 4 hours to increase overall activity tolerance/endurance  OT Frequency: Min 2X/week   Barriers to D/C: none known at this time    End of Session Activity Tolerance: Patient tolerated treatment well;Patient limited by fatigue Patient left: in chair;with  call bell/phone within reach (soft call bell)   Time: [id number] OT Time Calculation (min): 28 min Charges:  OT General Charges $OT Visit: 1 Procedure OT Evaluation $OT Eval Moderate Complexity: 1 Procedure OT Treatments $Self Care/Home Management : 8-22 mins  Chrys Racer , MS, OTR/L, CLT Pager: [phone]  10/27/2015, 10:17 AM

## 2015-10-27 NOTE — Plan of Care (Signed)
Problem: Activity: Goal: Risk for activity intolerance will decrease Outcome: Progressing Continuing to work with the client with active and passive range of motion. Briefly talked about inpatient rehab with the client and the importance of rest periods.

## 2015-10-27 NOTE — Progress Notes (Signed)
PMR Admission Coordinator Pre-Admission Assessment  Patient: Cynthia [name] is an 80 y.o., female MRN: [id number] DOB: 15-Mar-1936 Height: 5\' 2"  (157.5 cm) Weight: 58.106 kg (128 lb 1.6 oz)  Insurance Information HMO: PPO: yes PCP: IPA: 80/20: OTHER: Medicare replacement policy PRIMARY: [place] Policy#: [id number] Subscriber: pt CM Name: Orvan July Phone#: [phone] Fax#: [facility] portal Pre-Cert#: [id number] Employer: retired F/u with [employer]. Has EMR access Benefits: Phone #: [phone] Name: 3/13 Eff. Date: 08/16/15 Deduct: none Out of Pocket Max: $4000 Life Max: none CIR: $160 per day days 1-10 then covers 100% SNF: no copay days 1-20; $50 copay days 21-100 Outpatient: $20 per visit Co-Pay: no visit limit Home Health: 100% Co-Pay: no visit limit DME: 80% Co-Pay: 20% Providers: in netowrk  SECONDARY: none  Medicaid Application Date: Case Manager:  Disability Application Date: Case Worker:   Emergency Contact Information Contact Information    Name Relation Home Work Mobile   [place] Daughter [phone]  [phone]   Gary Fleet [phone]       Current Medical History  Patient Admitting Diagnosis: 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 right handed female with history of hypertension, CVA maintained on Plavix and little residual weakness, metastatic colon cancer status post right colectomy and chemotherapy 2015 followed by radiation oncology Dr. Lisbeth Renshaw.. Recently underwent ablation of liver lesions 09/25/2015. Patient lives with daughter and independent prior to admission. One level home. Presented 10/06/2015 with complaints of  tingling in her hands and feet and numbness to right arm and leg with associated dizziness. Cranial CT scan MRI showed no acute abnormalities. Echocardiogram with ejection fraction 65% mild MR. MRI thoracic lumbar spine show peripheral enhancement of the distal thoracic spinal cord as well as the nerve roots suggesting Guillain-Barr syndrome. Neurology follow-up placed on IVIG 5 days. Patient with acute respiratory failure required intubation 10/11/15- 10/18/2015. Hospital course gastroenterology for coffee-ground emesis hemoglobin 7.6 with EGD completed showing no active bleeding there was a tiny to small hiatal hernia. Hemoglobin and hematocrit remained stable latest hemoglobin 9.5. Patient has a nasogastric tube for for nutritional support and currently remains nothing by mouth with follow-up per speech therapy. Marland KitchenHCAP with antibiotic course completed. Subcutaneous Lovenox added for DVT prophylaxis 10/16/2015.   Rectal tube removed and foley catheter on 3/9 however foley replaced on 3/10 due to urinary retention. Self removed feeding tube on 3/9 but passed swallow eval on 3/10.   Intermittent hallucinations with narcotics with all removed 3/9 and discontinued gabapentin on 3/10. Patient became agitated over the weekend with neurology consulted and ordered EEG which was nonspecific and CT scan head unremarkable. UTI with Ceftriaxone began 3/9. With urine cultures with citrobacter . Plan for 7- 11 day course of treatment.  Past Medical History  Past Medical History  Diagnosis Date  . Anemia   . Blood transfusion without reported diagnosis jan 2015  . Heart murmur   . Hypertension     no bp meds   . Liver cyst   . C. difficile colitis 03/05/14, 03/22/14, 04/05/14    recurrent  . Stroke ([place]) 12/2014    NUMBNESS ON LEFT SIDE  . Cervical cancer ([place]) 1972  . Colon cancer ([place]) JAN 2015  . Colon cancer ([place])   . Cancer ([place])     liver  . Complication  of anesthesia     slow to awaken in past, DONE WELL RECENTLY  . Tinnitus of both ears ALL THE TIME  .  DVT (deep venous thrombosis) ([place]) 10/24/13    Right leg    Family History  family history includes Aneurysm in her paternal grandmother; Breast cancer in her maternal aunt; Cancer in her maternal grandmother and sister; Diabetes in her brother and sister; Heart failure in her brother, father, and maternal grandfather; Hypertension in her brother; Rheum arthritis in her mother.  Prior Rehab/Hospitalizations:  Has the patient had major surgery during 100 days prior to admission? No  Current Medications   Current facility-administered medications:  . acetaminophen (TYLENOL) tablet 650 mg, 650 mg, Oral, Q6H PRN, Caren Griffins, MD, 650 mg at 10/26/15 2038 . antiseptic oral rinse (CPC / CETYLPYRIDINIUM CHLORIDE 0.05%) solution 7 mL, 7 mL, Mouth Rinse, q12n4p, Raylene Miyamoto, MD, 7 mL at 10/27/15 1243 . atorvastatin (LIPITOR) tablet 40 mg, 40 mg, Oral, q1800, Caren Griffins, MD, 40 mg at 10/26/15 1750 . cefTRIAXone (ROCEPHIN) 1 g in dextrose 5 % 50 mL IVPB, 1 g, Intravenous, Q24H, Costin Karlyne Greenspan, MD, 1 g at 10/27/15 1240 . chlorhexidine (PERIDEX) 0.12 % solution 15 mL, 15 mL, Mouth Rinse, BID, Raylene Miyamoto, MD, 15 mL at 10/27/15 1104 . dextrose 5 % and 0.45 % NaCl with KCl 20 mEq/L infusion, , Intravenous, Continuous, Chesley Mires, MD, Last Rate: 30 mL/hr at 10/27/15 0407 . enoxaparin (LOVENOX) injection 40 mg, 40 mg, Subcutaneous, Q24H, Otilio Miu, RPH, 40 mg at 10/27/15 1108 . Gerhardt's butt cream, , Topical, QID, Chesley Mires, MD . hydrALAZINE (APRESOLINE) injection 10 mg, 10 mg, Intravenous, Q4H PRN, Chesley Mires, MD, 10 mg at 10/14/15 0242 . lactose free nutrition (BOOST PLUS) liquid 237 mL, 237 mL, Oral, TID BM, Costin Karlyne Greenspan, MD, 237 mL at 10/27/15 1106 . lidocaine (LIDODERM) 5 % 2 patch, 2 patch, Transdermal, Q24H, Ram Fuller Mandril, MD, 2 patch at 10/26/15 1900 . metoprolol succinate (TOPROL-XL) 24 hr tablet 37.5 mg, 37.5 mg, Oral, QHS, Caren Griffins, MD, 37.5 mg at 10/26/15 2257 . pantoprazole (PROTONIX) EC tablet 40 mg, 40 mg, Oral, Daily, Caren Griffins, MD, 40 mg at 10/27/15 1106 . polyethylene glycol (MIRALAX / GLYCOLAX) packet 17 g, 17 g, Oral, BID, Caren Griffins, MD, 17 g at 10/27/15 1107 . QUEtiapine (SEROQUEL) tablet 12.5 mg, 12.5 mg, Oral, QHS, Caren Griffins, MD, 12.5 mg at 10/26/15 2257  Patients Current Diet: DIET DYS 3 Room service appropriate?: Yes; Fluid consistency:: Thin  Precautions / Restrictions Precautions Precautions: Fall Restrictions Weight Bearing Restrictions: No   Has the patient had 2 or more falls or a fall with injury in the past year?No  Prior Activity Level Community (5-7x/wk): Did not drive due to CVA [id number]. Independent without AD. Did own cooking and own adls. Vision affected by previous CVA only. 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 Retired [employer].  Home Assistive Devices / Equipment Home Assistive Devices/Equipment: Bedside commode/3-in-1, Crutches, Grab bars around toilet, Wheelchair (all equipment from her Mom when she cared for her) Home Equipment: Environmental consultant - 2 wheels, Environmental consultant - standard, [employer] - quad, Crutches, Bedside commode, Shower seat, Grab bars - tub/shower  Prior Device Use: Indicate devices/aids used by the patient prior to current illness, exacerbation or injury? None of the above  Prior Functional Level Prior Function Level of Independence: Independent Comments: cooked and did all own adls.  Self Care: Did the patient need help bathing, dressing, using the toilet or eating? Independent  Indoor Mobility: Did the patient need assistance with walking  from room to room (with or without device)? Independent  Stairs: Did the patient need assistance with internal or external stairs (with or without device)?  Independent  Functional Cognition: Did the patient need help planning regular tasks such as shopping or remembering to take medications? Independent  Current Functional Level Cognition  Overall Cognitive Status: Within Functional Limits for tasks assessed (a little slow to process) Orientation Level: Oriented X4   Extremity Assessment (includes Sensation/Coordination)  Upper Extremity Assessment: Generalized weakness RUE Deficits / Details: AAROM WFL (minimal pain when flexing shoulder to 180*-full range), strength shoulder flexion 2+/5, elbow flexion 3/5, elbow extension 2/5; numbness and tingling fingers to elbow; poor control with movements, i.e. hit herself when flexing elbows RUE Sensation: decreased light touch RUE Coordination: decreased gross motor, decreased fine motor LUE Deficits / Details: AAROM WFL (minimal pain when flexing shoulder to 180*-full range), strength shoulder flexion 2+/5, elbow flexion 3/5, elbow extension 2/5; numbness and tingling fingers to elbow; poor control with movements, i.e. hit herself when flexing elbows LUE Sensation: decreased light touch LUE Coordination: decreased gross motor, decreased fine motor  Lower Extremity Assessment: Defer to PT evaluation RLE Deficits / Details: AAROM WFL, strength hip flexion 2-/5, knee extension 1+/5, ankle DF 0/5, numbness and tingling feet to knees RLE Sensation: decreased proprioception, decreased light touch RLE Coordination: decreased gross motor LLE Deficits / Details: AAROM WFL, strength hip flexion 2-/5, knee extension 1+/5, ankle DF 0/5, numbness and tingling feet to knees LLE Sensation: decreased light touch, decreased proprioception LLE Coordination: decreased gross motor    ADLs  Overall ADL's : Needs assistance/impaired Eating/Feeding: Maximal assistance, Total assistance, Bed level Eating/Feeding Details (indicate cue type and reason): hand over hand assist until pt too tired to  perform Grooming: Maximal assistance, Total assistance, Wash/dry hands, Wash/dry face, Oral care, Brushing hair Grooming Details (indicate cue type and reason): hand over hand assist until pt too tired to perform Upper Body Bathing: Total assistance, Sitting Upper Body Bathing Details (indicate cue type and reason): supported in recliner, recommending bed level ADL at this time Lower Body Bathing: Total assistance, +2 for physical assistance, +2 for safety/equipment, Sitting/lateral leans Lower Body Bathing Details (indicate cue type and reason): supported in recliner, recommending bed level ADL at this time Upper Body Dressing : Total assistance, Sitting Upper Body Dressing Details (indicate cue type and reason): supported in recliner, recommending bed level ADL at this time Lower Body Dressing: Total assistance, +2 for physical assistance, +2 for safety/equipment, Sitting/lateral leans Lower Body Dressing Details (indicate cue type and reason): supported in recliner, recommending bed level ADL at this time Toilet Transfer Details (indicate cue type and reason): did not occur, pt requires lift transfer for OOB, recommend bedpan a this time Toileting- Clothing Manipulation and Hygiene: Total assistance, Bed level, +2 for safety/equipment Tub/Shower Transfer Details (indicate cue type and reason): did not occur, pt requires lift transfer for OOB General ADL Comments: Pt found seated in recliner after nursing staff assisted pt into recliner via lift transfer. At this time, recommend pt complete bed level ADL for patient and staff safety. Recommend bedpan at this time as well.     Mobility  Overal bed mobility: Needs Assistance, +2 for physical assistance Bed Mobility: Supine to Sit, Sit to Supine Rolling: Max assist, +2 for physical assistance Sidelying to sit: Total assist, +2 for physical assistance Supine to sit: Total assist, HOB elevated Sit to supine: Total assist, +2 for physical  assistance Sit to sidelying: Min assist (assist to  bring LEs onto bed) General bed mobility comments: Pt found seated in recliner after nursing staff assisted with getting her OOB    Transfers  Overall transfer level: Needs assistance Transfer via Lift Equipment: Maximove Transfers: Lateral/Scoot Transfers Lateral/Scoot Transfers: Max assist General transfer comment: Did not occur, maxi move used to transfer patient by nursing staff prior to OT entering room    Ambulation / Gait / Stairs / Office manager / Balance Dynamic Sitting Balance Sitting balance - Comments: No attempts at assisting to maintain balance today despite cueing.  Balance Overall balance assessment: Needs assistance Sitting-balance support: Bilateral upper extremity supported, Feet supported Sitting balance-Leahy Scale: Zero Sitting balance - Comments: No attempts at assisting to maintain balance today despite cueing.  Postural control: Posterior lean Standing balance comment: unable dur to paralysis     Special needs/care consideration  Skin reddened perianal area for had rectal tube last week up until 3/9 with frequent BMS. Using Gearhardt cream prn for moisture  Bowel mgmt: incontinent Bladder mgmt: Foley reinserted due to urinary retention. Treating since 3/9 for UTI    Previous Home Environment Living Arrangements: Children (Pt and dtr, Merchant navy officer, live together) Lives With: Daughter Available Help at Discharge: Family, Available 24 hours/day Type of Home: House Home Layout: One level Home Access: Stairs to enter Entrance Stairs-Rails: None Entrance Stairs-Number of Steps: 1 Bathroom Shower/Tub: Optometrist: Yes How Accessible: Accessible via walker Gracey: No  Discharge Living Setting Plans for Discharge Living Setting: Patient's home, Lives with (comment)  (daughter) Type of Home at Discharge: House Discharge Home Layout: One level Discharge Home Access: Stairs to enter Entrance Stairs-Rails: None Entrance Stairs-Number of Steps: 1 Discharge Bathroom Shower/Tub: Tub/shower unit Discharge Bathroom Toilet: Standard Discharge Bathroom Accessibility: Yes How Accessible: Accessible via walker Does the patient have any problems obtaining your medications?: No  Social/Family/Support Systems Patient Roles: Parent Contact Information: [name], daughter Anticipated Caregiver: daughter  Anticipated Caregiver's Contact Information: see above Ability/Limitations of Caregiver: Dtr cleans houses so can arrange assist with sister in law as needed Caregiver Availability: 24/7 Discharge Plan Discussed with Primary Caregiver: Yes Is Caregiver In Agreement with Plan?: Yes Does Caregiver/Family have Issues with Lodging/Transportation while Pt is in Rehab?: No  Goals/Additional Needs Patient/Family Goal for Rehab: Min to mod assist short distances at wheelchair level Expected length of stay: ELOS 22-28 days Dietary Needs: poor appetite . Likes boost Pt/Family Agrees to Admission and willing to participate: Yes Program Orientation Provided & Reviewed with Pt/Caregiver Including Roles & Responsibilities: Yes  Decrease burden of Care through IP rehab admission: n/a  Possible need for SNF placement upon discharge: not anticipated  Patient Condition: This patient's medical and functional status has changed since the consult dated: 10/21/15 in which the Rehabilitation Physician determined and documented that the patient's condition is appropriate for intensive rehabilitative care in an inpatient rehabilitation facility. See "History of Present Illness" (above) for medical update. Functional changes are: max to total assist. Patient's medical and functional status update has been discussed with the Rehabilitation physician and patient remains appropriate for  inpatient rehabilitation. Will admit to inpatient rehab today.  Preadmission Screen Completed By: Cleatrice Burke, 10/27/2015 1:56 PM ______________________________________________________________________  Discussed status with Dr. Posey Pronto on 10/27/15 at 1355 and received telephone approval for admission today.  Admission Coordinator: Cleatrice Burke, time [id number] Date 10/27/15          Cosigned by: Ankit Lorie Phenix, MD at  10/27/2015 2:02 PM  Revision History     Date/Time User Provider Type Action   10/27/2015 2:02 PM Ankit Lorie Phenix, MD Physician Cosign   10/27/2015 1:56 PM Cristina Gong, RN Rehab Admission Coordinator Sign

## 2015-10-27 NOTE — NC FL2 (Signed)
[place] LEVEL OF CARE SCREENING TOOL     IDENTIFICATION  Patient Name: Cynthia [name] Birthdate: 05-Aug-1936 Sex: female Admission Date (Current Location): 10/06/2015  [facility] and [place] Number:  Herbalist and Address:  [facility] [place] [address] [place], [place] [address]      Provider Number: [id number]  Attending Physician Name and Address:  Caren Griffins, MD  Relative Name and Phone Number:       Current Level of Care: Hospital Recommended Level of Care: [place] Prior Approval Number:    Date Approved/Denied:   PASRR Number: [id number]  Discharge Plan: SNF    Current Diagnoses: Patient Active Problem List   Diagnosis Date Noted  . GBS (Guillain-Barre syndrome) ([place])   . Aspiration into airway   . Endotracheally intubated   . Acute respiratory failure ([place])   . Shock ([place]) 10/09/2015  . Hemoptysis   . Transverse myelitis ([place])   . Peripheral neuropathy ([place]) 10/06/2015  . Paresthesia of both hands 10/06/2015  . Paresthesia of both feet 10/06/2015  . Cerebral embolism with cerebral infarction 10/06/2015  . Ataxia   . Colon carcinoma metastatic to liver ([place]) 09/25/2015  . Metastasis to liver ([place]) 08/28/2015  . Colon cancer metastasized to liver ([place])   . Liver lesion   . Cerebral infarction due to unspecified mechanism 01/29/2015  . B12 deficiency 01/29/2015  . Essential hypertension 01/29/2015  . Arm numbness left   . Disorientation   . Numbness and tingling of left side of face   . TIA (transient ischemic attack) 01/11/2015  . Confusion   . Left arm numbness   . DVT, lower extremity, distal ([place]) 10/27/2013  . T4a, N0 09/27/2013  . Iron deficiency anemia 09/04/2013  . Symptomatic anemia 09/03/2013  . Orthostasis 09/03/2013  . Near syncope 09/03/2013    Orientation RESPIRATION BLADDER Height & Weight     Self  Normal Continent Weight: 58.106 kg (128 lb 1.6  oz) Height:  5\' 2"  (157.5 cm)  BEHAVIORAL SYMPTOMS/MOOD NEUROLOGICAL BOWEL NUTRITION STATUS   (NONE)  (NONE) Continent Diet (DYS 3)  AMBULATORY STATUS COMMUNICATION OF NEEDS Skin   Extensive Assist Verbally Normal                       Personal Care Assistance Level of Assistance  Bathing, Feeding, Dressing Bathing Assistance: Maximum assistance Feeding assistance: Independent Dressing Assistance: Maximum assistance     Functional Limitations Info  Sight, Hearing, Speech Sight Info: Adequate Hearing Info: Adequate Speech Info: Adequate    SPECIAL CARE FACTORS FREQUENCY  PT (By licensed PT), OT (By licensed OT), Speech therapy     PT Frequency: 5/week OT Frequency: 5/week     Speech Therapy Frequency: 5/week      Contractures Contractures Info: Not present    Additional Factors Info  Allergies, Code Status, Psychotropic Code Status Info: Full Allergies Info: NKDA Psychotropic Info: Seroquel         Current Medications (10/27/2015):  This is the current hospital active medication list Current Facility-Administered Medications  Medication Dose Route Frequency Provider Last Rate Last Dose  . acetaminophen (TYLENOL) tablet 650 mg  650 mg Oral Q6H PRN Caren Griffins, MD   650 mg at 10/26/15 2038  . antiseptic oral rinse (CPC / CETYLPYRIDINIUM CHLORIDE 0.05%) solution 7 mL  7 mL Mouth Rinse q12n4p Raylene Miyamoto, MD   7 mL at 10/26/15 1600  .  atorvastatin (LIPITOR) tablet 40 mg  40 mg Oral q1800 Caren Griffins, MD   40 mg at 10/26/15 1750  . cefTRIAXone (ROCEPHIN) 1 g in dextrose 5 % 50 mL IVPB  1 g Intravenous Q24H Caren Griffins, MD   1 g at 10/26/15 1046  . chlorhexidine (PERIDEX) 0.12 % solution 15 mL  15 mL Mouth Rinse BID Raylene Miyamoto, MD   15 mL at 10/26/15 1000  . dextrose 5 % and 0.45 % NaCl with KCl 20 mEq/L infusion   Intravenous Continuous Chesley Mires, MD 30 mL/hr at 10/27/15 0407    . enoxaparin (LOVENOX) injection 40 mg  40 mg  Subcutaneous Q24H Otilio Miu, RPH   40 mg at 10/26/15 [id number]  . Gerhardt's butt cream   Topical QID Chesley Mires, MD      . hydrALAZINE (APRESOLINE) injection 10 mg  10 mg Intravenous Q4H PRN Chesley Mires, MD   10 mg at 10/14/15 0242  . lactose free nutrition (BOOST PLUS) liquid 237 mL  237 mL Oral TID BM Costin Karlyne Greenspan, MD   237 mL at 10/26/15 2000  . lidocaine (LIDODERM) 5 % 2 patch  2 patch Transdermal Q24H Ram Fuller Mandril, MD   2 patch at 10/26/15 1900  . metoprolol succinate (TOPROL-XL) 24 hr tablet 37.5 mg  37.5 mg Oral QHS Caren Griffins, MD   37.5 mg at 10/26/15 2257  . pantoprazole (PROTONIX) EC tablet 40 mg  40 mg Oral Daily Caren Griffins, MD   40 mg at 10/26/15 0738  . polyethylene glycol (MIRALAX / GLYCOLAX) packet 17 g  17 g Oral BID Caren Griffins, MD   17 g at 10/26/15 2259  . QUEtiapine (SEROQUEL) tablet 12.5 mg  12.5 mg Oral QHS Caren Griffins, MD   12.5 mg at 10/26/15 2257     Discharge Medications: Please see discharge summary for a list of discharge medications.  Relevant Imaging Results:  Relevant Lab Results:   Additional Information SSN: [id number]  Rigoberto Noel, LCSW

## 2015-10-27 NOTE — Progress Notes (Signed)
NIF -40 , VC .45 good effort

## 2015-10-27 NOTE — H&P (Signed)
**Note Cynthia-Identified via Obfuscation**   Physical Medicine and Rehabilitation Admission H&P    Chief Complaint  Patient presents with  . GBS   HPI:  Cynthia [name] is an 80 y.o. female with metastatic colon cancer s/p surgery and chemotherapy, recurrent C-diff, HTN, stroke with lleft sided weakness/numbness, DVT who was admitted on 10/06/15 with complaints of dizziness, paraesthesias of right arm and right leg, numbness of hands and feet, BLE weakness and difficulty with walking.  She underwent thermal ablation of liver metastatic lesion on 09/25/15 and has been having low grade fevers PTA.  MRI brain negative for stroke.  She developed hypotension with hypotension and large amount of coffee ground emesis on 02/24 and Gi consulted for input. Dr. Outlaw felt symptoms due to esophagitis/gastritis from plavix and recommended serial H/H and hydration.   She was intubated 02/26- 03/05 due to respiratory insufficiency and was treated for HCAP with Vanc/Zosyn.  MRI Cervical/thoracic/lumbar spine showed severe neural foraminal stenosis R-C4, R-C5, B-C6 and L-C7 nerve roots and peripheral enhancement of distal thoracic spinal cord is well nerve roots with anterior and posterior suggestive of GBS. Due to areflexia of BLE and hypoactive UE reflexes she was treated with IVIG X 5 doses.  She has had visual and tactile hallucinations felt to be due to opiates.    She underwent EGD 03/1 showing question of proximal linear ulcer question from NGT and she was cleared to resume blood thinners.   BUE strength has been improving and respiratory status stable. FEES 03/6 Showed severe dysphagia and patient kept NPO with tube feeds for nutritional support. Repeat MBS done 03/10 and she was started on  She continues to have intermittent hallucinations/confusion question due to Citrobacter UTI and was started on Ceftriaxone 3/9. Had MS changes with hypoxia and lethargy therefore EEG done 03/11 showing mild generalized slowing likely due to metabolic or  toxic encephalopathy v/s degenerative central nervous disorders.  Psychiatry consulted for input and medications adjusted.  She continues to be limited by motor weakness, ongoing issues with insomnia and confusion, poor po intake as well as pain and fatigue. CIR recommended for follow up therapy and patient admitted today.    Review of Systems  Constitutional:       Poor appetite  HENT: Negative for hearing loss.   Eyes: Negative for blurred vision and double vision.  Respiratory: Negative for cough, sputum production and shortness of breath.   Cardiovascular: Negative for chest pain and palpitations.  Gastrointestinal: Negative for heartburn, nausea and diarrhea.  Genitourinary: Negative for dysuria and urgency.  Musculoskeletal: Positive for myalgias.  Skin: Negative for rash.  Neurological: Positive for tingling, sensory change, focal weakness and weakness. Negative for headaches.  Psychiatric/Behavioral: The patient is nervous/anxious and has insomnia.   All other systems reviewed and are negative.     Past Medical History  Diagnosis Date  . Anemia   . Blood transfusion without reported diagnosis jan 2015  . Heart murmur   . Hypertension     no bp meds   . Liver cyst   . C. difficile colitis 03/05/14, 03/22/14, 04/05/14    recurrent  . Stroke (HCC) 12/2014    NUMBNESS ON LEFT SIDE  . Cervical cancer (HCC) 1972  . Colon cancer (HCC) JAN 2015  . Colon cancer (HCC)   . Cancer (HCC)     liver  . Complication of anesthesia     slow to awaken in past, DONE WELL RECENTLY  . Tinnitus of both ears ALL THE TIME  .   DVT (deep venous thrombosis) ([place]) 10/24/13    Right leg    Past Surgical History  Procedure Laterality Date  . Back surgery  1981    lower lumbar  . Tubal ligation  1968  . Laparoscopic right colectomy Right 10/22/2013    Procedure: LAPAROSCOPIC RIGHT COLECTOMY ;  Surgeon: [name], MD;  Location: [facility];  Service: General;  Laterality: Right;  .  Salpingoophorectomy  10/22/2013    Procedure: Marquette Saa;  Surgeon: [name]. [name], MD;  Location: [facility];  Service: Gynecology;;  . Abdominal hysterectomy      vaginal- partial  . Esophagogastroduodenoscopy N/A 10/14/2015    Procedure: ESOPHAGOGASTRODUODENOSCOPY (EGD);  Surgeon: Clarene Essex, MD;  Location: [facility];  Service: Endoscopy;  Laterality: N/A;  Bedside in ICU    Family History  Problem Relation Age of Onset  . Diabetes Sister   . Cancer Sister     leukemia  . Diabetes Brother   . Heart failure Brother   . Hypertension Brother   . Breast cancer Maternal Aunt   . Rheum arthritis Mother   . Heart failure Father   . Cancer Maternal Grandmother     GIST  . Heart failure Maternal Grandfather   . Aneurysm Paternal Grandmother     Social History:  reports that she has never smoked. She has never used smokeless tobacco. She reports that she does not drink alcohol or use illicit drugs.    Allergies: No Known Allergies    Medications Prior to Admission  Medication Sig Dispense Refill  . acetaminophen (TYLENOL) 500 MG tablet Take 500 mg by mouth every 6 (six) hours as needed for mild pain.    Marland Kitchen aspirin EC 81 MG tablet Take 1 tablet (81 mg total) by mouth daily. (Patient taking differently: Take 81 mg by mouth every morning. ) 90 tablet 0  . atorvastatin (LIPITOR) 40 MG tablet Take 1 tablet (40 mg total) by mouth daily. 90 tablet 0  . metoprolol succinate (TOPROL-XL) 25 MG 24 hr tablet Take 37.5 mg by mouth every evening.     . Multiple Vitamin (MULTIVITAMIN WITH MINERALS) TABS tablet Take 1 tablet by mouth every morning.       Home: Home Living Family/patient expects to be discharged to:: Inpatient rehab Living Arrangements: Children (daughter) Available Help at Discharge: Family, Available 24 hours/day (between daughter and family members) Type of Home: House Home Access: Stairs to enter Technical brewer of Steps: 1 Entrance Stairs-Rails:  None Home Layout: One level Bathroom Shower/Tub: Chiropodist: Standard Bathroom Accessibility: Yes Home Equipment: Environmental consultant - 2 wheels, Environmental consultant - standard, Cane - quad, Crutches, Bedside commode, Shower seat, Grab bars - tub/shower   Functional History: Prior Function Level of Independence: Independent  Functional Status:  Mobility: Bed Mobility Overal bed mobility: Needs Assistance, +2 for physical assistance Bed Mobility: Supine to Sit, Sit to Supine Rolling: Max assist, +2 for physical assistance Sidelying to sit: Total assist, +2 for physical assistance Supine to sit: Total assist, HOB elevated Sit to supine: Total assist, +2 for physical assistance Sit to sidelying: Min assist (assist to bring LEs onto bed) General bed mobility comments: Pt found seated in recliner after nursing staff assisted with getting her OOB Transfers Overall transfer level: Needs assistance Transfer via Lift Equipment: Maximove Transfers: Lateral/Scoot Transfers  Lateral/Scoot Transfers: Max assist General transfer comment: Did not occur, maxi move used to transfer patient by nursing staff prior to OT entering room  ADL: ADL Overall ADL's : Needs assistance/impaired Eating/Feeding: Maximal assistance, Total assistance, Bed level Eating/Feeding Details (indicate cue type and reason): hand over hand assist until pt too tired to perform Grooming: Maximal assistance, Total assistance, Wash/dry hands, Wash/dry face, Oral care, Brushing hair Grooming Details (indicate cue type and reason): hand over hand assist until pt too tired to perform Upper Body Bathing: Total assistance, Sitting Upper Body Bathing Details (indicate cue type and reason): supported in recliner, recommending bed level ADL at this time Lower Body Bathing: Total assistance, +2 for physical assistance, +2 for safety/equipment, Sitting/lateral leans Lower Body Bathing Details (indicate cue type and reason):  supported in recliner, recommending bed level ADL at this time Upper Body Dressing : Total assistance, Sitting Upper Body Dressing Details (indicate cue type and reason): supported in recliner, recommending bed level ADL at this time Lower Body Dressing: Total assistance, +2 for physical assistance, +2 for safety/equipment, Sitting/lateral leans Lower Body Dressing Details (indicate cue type and reason): supported in recliner, recommending bed level ADL at this time Toilet Transfer Details (indicate cue type and reason): did not occur, pt requires lift transfer for OOB, recommend bedpan a this time Toileting- Clothing Manipulation and Hygiene: Total assistance, Bed level, +2 for safety/equipment Tub/Shower Transfer Details (indicate cue type and reason): did not occur, pt requires lift transfer for OOB General ADL Comments: Pt found seated in recliner after nursing staff assisted pt into recliner via lift transfer. At this time, recommend pt complete bed level ADL for patient and staff safety. Recommend bedpan at this time as well.   Cognition: Cognition Overall Cognitive Status: Within Functional Limits for tasks assessed (a little slow to process) Orientation Level: Oriented X4 Cognition Arousal/Alertness: Awake/alert Behavior During Therapy: WFL for tasks assessed/performed Overall Cognitive Status: Within Functional Limits for tasks assessed (a little slow to process)  Physical Exam: Blood pressure 127/70, pulse 70, temperature 97.3 F (36.3 C), temperature source Oral, resp. rate 20, height 5' 2" (1.575 m), weight 58.106 kg (128 lb 1.6 oz), SpO2 98 %. Physical Exam  Nursing note and vitals reviewed. Constitutional: She is oriented to person, place, and time. She appears well-developed and well-nourished.  Anxious appearing elderly female.   HENT:  Head: Normocephalic and atraumatic.  Lips with dry flaky skin and dry oral mucous membranes.   Eyes: Conjunctivae and EOM are normal.  Pupils are equal, round, and reactive to light.  Neck: Normal range of motion. Neck supple.  Cardiovascular: Normal rate and regular rhythm.   No murmur heard. Respiratory: Effort normal and breath sounds normal. No respiratory distress. She has no wheezes.  GI: Soft. Bowel sounds are normal. She exhibits no distension. There is no tenderness.  Musculoskeletal: She exhibits edema. She exhibits no tenderness.  Neurological: She is alert and oriented to person, place, and time.  Speech clear.  Oriented to month, year, place, pres, DOB.  Able to follow basic commands without difficulty.   Weak voice DTRs diminished lower extremities or the upper extremities R UE: Shoulder abduction, elbow flexion/extension 3+/5, wrist extension, hand grip 3/5 LUE: Shoulder abduction, elbow flexion/extension 3+/5, wrist extension, hand grip 2+/5 RLE: Hip flexion 3/5, knee extension 2/5, ankle dorsi/plantar flexion 0/5 LLE: Hip flexion 3/5, knee extension 0/5, ankle dorsi/plantar flexion 0/5 Numbness L5/S1 distribution bilaterally.   Skin: Skin is warm and dry.  Psychiatric: Her behavior is normal. Thought content normal. Her mood appears anxious.    Results for orders placed or performed during the hospital encounter of 10/06/15 (from the   past 48 hour(s))  Glucose, capillary     Status: Abnormal   Collection Time: 10/25/15  4:48 PM  Result Value Ref Range   Glucose-Capillary 116 (H) 65 - 99 mg/dL  Blood gas, arterial     Status: Abnormal   Collection Time: 10/25/15  9:14 PM  Result Value Ref Range   FIO2 0.21    pH, Arterial 7.446 7.350 - 7.450   pCO2 arterial 35.4 35.0 - 45.0 mmHg   pO2, Arterial 74.8 (L) 80.0 - 100.0 mmHg   Bicarbonate 24.0 20.0 - 24.0 mEq/L   TCO2 25.1 0 - 100 mmol/L   Acid-Base Excess 0.4 0.0 - 2.0 mmol/L   O2 Saturation 95.0 %   Patient temperature 98.6    Collection site BRACHIAL ARTERY    Drawn by [id number]    Sample type ARTERIAL DRAW    Allens test (pass/fail) PASS PASS    Glucose, capillary     Status: Abnormal   Collection Time: 10/25/15  9:54 PM  Result Value Ref Range   Glucose-Capillary 110 (H) 65 - 99 mg/dL  Procalcitonin     Status: None   Collection Time: 10/26/15  4:20 AM  Result Value Ref Range   Procalcitonin <0.10 ng/mL    Comment:        Interpretation: PCT (Procalcitonin) <= 0.5 ng/mL: Systemic infection (sepsis) is not likely. Local bacterial infection is possible. (NOTE)         ICU PCT Algorithm               Non ICU PCT Algorithm    ----------------------------     ------------------------------         PCT < 0.25 ng/mL                 PCT < 0.1 ng/mL     Stopping of antibiotics            Stopping of antibiotics       strongly encouraged.               strongly encouraged.    ----------------------------     ------------------------------       PCT level decrease by               PCT < 0.25 ng/mL       >= 80% from peak PCT       OR PCT 0.25 - 0.5 ng/mL          Stopping of antibiotics                                             encouraged.     Stopping of antibiotics           encouraged.    ----------------------------     ------------------------------       PCT level decrease by              PCT >= 0.25 ng/mL       < 80% from peak PCT        AND PCT >= 0.5 ng/mL            Continuin g antibiotics                                                encouraged.       Continuing antibiotics            encouraged.    ----------------------------     ------------------------------     PCT level increase compared          PCT > 0.5 ng/mL         with peak PCT AND          PCT >= 0.5 ng/mL             Escalation of antibiotics                                          strongly encouraged.      Escalation of antibiotics        strongly encouraged.   Glucose, capillary     Status: None   Collection Time: 10/26/15  8:30 AM  Result Value Ref Range   Glucose-Capillary 99 65 - 99 mg/dL   Comment 1 Notify RN    Comment 2 Document in  Chart   Glucose, capillary     Status: Abnormal   Collection Time: 10/26/15 11:43 AM  Result Value Ref Range   Glucose-Capillary 128 (H) 65 - 99 mg/dL  Glucose, capillary     Status: Abnormal   Collection Time: 10/26/15  5:32 PM  Result Value Ref Range   Glucose-Capillary 108 (H) 65 - 99 mg/dL   Comment 1 Notify RN    Comment 2 Document in Chart   Glucose, capillary     Status: None   Collection Time: 10/26/15  9:01 PM  Result Value Ref Range   Glucose-Capillary 93 65 - 99 mg/dL  Procalcitonin     Status: None   Collection Time: 10/27/15  4:58 AM  Result Value Ref Range   Procalcitonin <0.10 ng/mL    Comment:        Interpretation: PCT (Procalcitonin) <= 0.5 ng/mL: Systemic infection (sepsis) is not likely. Local bacterial infection is possible. (NOTE)         ICU PCT Algorithm               Non ICU PCT Algorithm    ----------------------------     ------------------------------         PCT < 0.25 ng/mL                 PCT < 0.1 ng/mL     Stopping of antibiotics            Stopping of antibiotics       strongly encouraged.               strongly encouraged.    ----------------------------     ------------------------------       PCT level decrease by               PCT < 0.25 ng/mL       >= 80% from peak PCT       OR PCT 0.25 - 0.5 ng/mL          Stopping of antibiotics                                             encouraged.     Stopping of antibiotics             encouraged.    ----------------------------     ------------------------------       PCT level decrease by              PCT >= 0.25 ng/mL       < 80% from peak PCT        AND PCT >= 0.5 ng/mL            Continuin g antibiotics                                              encouraged.       Continuing antibiotics            encouraged.    ----------------------------     ------------------------------     PCT level increase compared          PCT > 0.5 ng/mL         with peak PCT AND          PCT >= 0.5 ng/mL              Escalation of antibiotics                                          strongly encouraged.      Escalation of antibiotics        strongly encouraged.   Basic metabolic panel     Status: Abnormal   Collection Time: 10/27/15  4:58 AM  Result Value Ref Range   Sodium 137 135 - 145 mmol/L   Potassium 3.8 3.5 - 5.1 mmol/L   Chloride 103 101 - 111 mmol/L   CO2 25 22 - 32 mmol/L   Glucose, Bld 100 (H) 65 - 99 mg/dL   BUN 19 6 - 20 mg/dL   Creatinine, Ser 0.62 0.44 - 1.00 mg/dL   Calcium 8.7 (L) 8.9 - 10.3 mg/dL   GFR calc non Af Amer >60 >60 mL/min   GFR calc Af Amer >60 >60 mL/min    Comment: (NOTE) The eGFR has been calculated using the CKD EPI equation. This calculation has not been validated in all clinical situations. eGFR's persistently <60 mL/min signify possible Chronic Kidney Disease.    Anion gap 9 5 - 15  CBC     Status: Abnormal   Collection Time: 10/27/15  4:58 AM  Result Value Ref Range   WBC 6.0 4.0 - 10.5 K/uL   RBC 3.54 (L) 3.87 - 5.11 MIL/uL   Hemoglobin 10.1 (L) 12.0 - 15.0 g/dL   HCT 32.3 (L) 36.0 - 46.0 %   MCV 91.2 78.0 - 100.0 fL   MCH 28.5 26.0 - 34.0 pg   MCHC 31.3 30.0 - 36.0 g/dL   RDW 16.2 (H) 11.5 - 15.5 %   Platelets 438 (H) 150 - 400 K/uL  Glucose, capillary     Status: None   Collection Time: 10/27/15  7:40 AM  Result Value Ref Range   Glucose-Capillary 99 65 - 99 mg/dL  Glucose, capillary     Status: Abnormal   Collection Time: 10/27/15 11:26 AM  Result Value Ref Range   Glucose-Capillary 102 (H) 65 - 99 mg/dL   No results found.   Medical Problem List and Plan: 1.  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 2.  DVT Prophylaxis/Anticoagulation: Pharmaceutical: Lovenox 3. Pain Management: Tylenol prn. Limit narcotics.  4. Mood: LCSW to follow for evaluation and support.  5. Neuropsych: This patient is not fully capable of making decisions on her own behalf. 6. Skin/Wound Care: Routine pressure relief measures.  7.  Fluids/Electrolytes/Nutrition: Monitor I/O. Check lytes in am. Offer supplements between meals.  8.  Citrobacter Koseri UTI: On Ceftriaxone D#6/7. Discontinue foley and start bladder training. Avoid prolonged antibiotics due to history of C diff.  9. Colon cancer with liver mets: No complaints of pain.   10. UGIB with ABLA: H/H stable and anemia resolving. Continue to monitor for any recurrent signs of bleeding.    Post Admission Physician Evaluation: 1. Functional deficits secondary  to GBS. 2. Patient is admitted to receive collaborative, interdisciplinary care between the physiatrist, rehab nursing staff, and therapy team. 3. Patient's level of medical complexity and substantial therapy needs in context of that medical necessity cannot be provided at a lesser intensity of care such as a SNF. 4. Patient has experienced substantial functional loss from his/her baseline which was documented above under the "Functional History" and "Functional Status" headings.  Judging by the patient's diagnosis, physical exam, and functional history, the patient has potential for functional progress which will result in measurable gains while on inpatient rehab.  These gains will be of substantial and practical use upon discharge  in facilitating mobility and self-care at the household level. 5. Physiatrist will provide 24 hour management of medical needs as well as oversight of the therapy plan/treatment and provide guidance as appropriate regarding the interaction of the two. 6. 24 hour rehab nursing will assist with bladder management, bowel management, safety, skin/wound care, disease management, medication administration, pain management and patient education and help integrate therapy concepts, techniques,education, etc. 7. PT will assess and treat for/with: Lower extremity strength, range of motion, stamina, balance, functional mobility, safety, adaptive techniques and equipment, woundcare, coping skills, pain  control, education.   Goals are: Mod/min assist. 8. OT will assess and treat for/with: ADL's, functional mobility, safety, upper extremity strength, adaptive techniques and equipment, wound mgt, ego support, and community reintegration.   Goals are: Mod/min assist. Therapy may proceed with showering this patient. 9. SLP will assess and treat for/with: Speech, high-level cognition, swallowing.  Goals are: Min/mod assist. 10. Case Management and Social Worker will assess and treat for psychological issues and discharge planning. 11. Team conference will be held weekly to assess progress toward goals and to determine barriers to discharge. 12. Patient will receive at least 3 hours of therapy per day at least 5 days per week. 13. ELOS: 20-25 days.       14. Prognosis:  good  Ankit Patel, MD 10/27/2015 

## 2015-10-27 NOTE — Progress Notes (Signed)
Physical Medicine and Rehabilitation Consult Reason for Consult: GBS/multi-medical Referring Physician: [facility]   HPI: Cynthia [name] is a 80 y.o. right handed female with history of hypertension, CVA maintained on Plavix and little residual weakness, metastatic colon cancer status post right colectomy and chemotherapy 2015 followed by radiation oncology Dr. Lisbeth Renshaw.. Recently underwent ablation of liver lesions 09/25/2015. Patient lives with daughter and independent prior to admission. One level home. Presented 10/06/2015 with complaints of tingling in her hands and feet and numbness to right arm and leg with associated dizziness. Cranial CT scan MRI showed no acute abnormalities. Echocardiogram with ejection fraction 65% mild MR. MRI thoracic lumbar spine show peripheral enhancement of the distal thoracic spinal cord as well as the nerve roots suggesting Guillain-Barr syndrome. Neurology follow-up placed on IVIG 5 days. Patient with acute respiratory failure required intubation 10/11/15- 10/18/2015. Hospital course gastroenterology for coffee-ground emesis hemoglobin 7.6 with EGD completed showing no active bleeding there was a tiny to small hiatal hernia. Hemoglobin and hematocrit remained stable latest hemoglobin 9.5. Patient has a nasogastric tube for for nutritional support and currently remains nothing by mouth with follow-up per speech therapy. Marland KitchenHCAP with antibiotic course completed. Subcutaneous Lovenox added for DVT prophylaxis 10/16/2015. Physical therapy evaluation completed and ongoing with recommendations of physical medicine rehabilitation consult.  Still has rectal tube, last IV fentanyl yesterday  Review of Systems  Constitutional: Positive for malaise/fatigue. Negative for fever and chills.  HENT: Negative for hearing loss.  Eyes: Negative for blurred vision and double vision.  Respiratory: Negative for cough.   Some shortness of breath with heavy exertion    Cardiovascular: Positive for leg swelling. Negative for chest pain and palpitations.  Gastrointestinal: Positive for constipation. Negative for nausea and vomiting.  Genitourinary: Negative for dysuria and hematuria.  Musculoskeletal: Positive for myalgias and joint pain.  Skin: Negative for rash.  Neurological: Positive for weakness. Negative for seizures and headaches.  All other systems reviewed and are negative.  Past Medical History  Diagnosis Date  . Anemia   . Blood transfusion without reported diagnosis jan 2015  . Heart murmur   . Hypertension     no bp meds   . Liver cyst   . C. difficile colitis 03/05/14, 03/22/14, 04/05/14    recurrent  . Stroke ([place]) 12/2014    NUMBNESS ON LEFT SIDE  . Cervical cancer ([place]) 1972  . Colon cancer ([place]) JAN 2015  . Colon cancer ([place])   . Cancer ([place])     liver  . Complication of anesthesia     slow to awaken in past, DONE WELL RECENTLY  . Tinnitus of both ears ALL THE TIME  . DVT (deep venous thrombosis) ([place]) 10/24/13    Right leg   Past Surgical History  Procedure Laterality Date  . Back surgery  1981    lower lumbar  . Tubal ligation  1968  . Laparoscopic right colectomy Right 10/22/2013    Procedure: LAPAROSCOPIC RIGHT COLECTOMY ; Surgeon: [name], MD; Location: [facility] ORS; Service: General; Laterality: Right;  . Salpingoophorectomy  10/22/2013    Procedure: [place] Saa; Surgeon: [name]. [name], MD; Location: [facility] ORS; Service: Gynecology;;  . Abdominal hysterectomy      vaginal- partial  . Esophagogastroduodenoscopy N/A 10/14/2015    Procedure: ESOPHAGOGASTRODUODENOSCOPY (EGD); Surgeon: Clarene Essex, MD; Location: [facility]; Service: Endoscopy; Laterality: N/A; Bedside in ICU   Family History  Problem Relation Age of Onset  . Diabetes Sister   . Cancer Sister  leukemia   . Diabetes Brother   . Heart failure Brother   . Hypertension Brother   . Breast cancer Maternal Aunt   . Rheum arthritis Mother   . Heart failure Father   . Cancer Maternal Grandmother     GIST  . Heart failure Maternal Grandfather   . Aneurysm Paternal Grandmother    Social History:  reports that she has never smoked. She has never used smokeless tobacco. She reports that she does not drink alcohol or use illicit drugs. Allergies: No Known Allergies Medications Prior to Admission  Medication Sig Dispense Refill  . acetaminophen (TYLENOL) 500 MG tablet Take 500 mg by mouth every 6 (six) hours as needed for mild pain.    Marland Kitchen aspirin EC 81 MG tablet Take 1 tablet (81 mg total) by mouth daily. (Patient taking differently: Take 81 mg by mouth every morning. ) 90 tablet 0  . atorvastatin (LIPITOR) 40 MG tablet Take 1 tablet (40 mg total) by mouth daily. 90 tablet 0  . metoprolol succinate (TOPROL-XL) 25 MG 24 hr tablet Take 37.5 mg by mouth every evening.     . Multiple Vitamin (MULTIVITAMIN WITH MINERALS) TABS tablet Take 1 tablet by mouth every morning.       Home: Home Living Family/patient expects to be discharged to:: Inpatient rehab Living Arrangements: Children Available Help at Discharge: Family, Available 24 hours/day Type of Home: House Home Access: Stairs to enter [employer] of Steps: 1 Entrance Stairs-Rails: None Home Layout: One level Bathroom Shower/Tub: Chiropodist: Standard Bathroom Accessibility: Yes Home Equipment: Environmental consultant - 2 wheels, Walker - standard, Cane - quad, Crutches, Bedside commode, Shower seat, Grab bars - tub/shower  Functional History: Prior Function Level of Independence: Independent Functional Status:  Mobility: Bed Mobility Overal bed mobility: Needs Assistance Bed Mobility: Rolling Rolling: Max assist, +2 for physical assistance Sidelying to  sit: Total assist, +2 for physical assistance Sit to supine: +2 for physical assistance, Total assist Sit to sidelying: Min assist (assist to bring LEs onto bed) General bed mobility comments: Assist to flex knee and reach for rail when rolling to right/left. Difficulty maintaining grip on rail with BUEs.  Transfers Overall transfer level: Needs assistance Transfer via Lift Equipment: Maximove Transfers: Lateral/Scoot Transfers Lateral/Scoot Transfers: Max assist General transfer comment: Used maxi move to transfer pt to chair.       ADL: ADL Overall ADL's : Needs assistance/impaired Eating/Feeding: Set up, Sitting Grooming: Minimal assistance, Sitting Upper Body Bathing: Minimal assitance, Sitting Lower Body Bathing: Moderate assistance, +2 for safety/equipment, Sit to/from stand Upper Body Dressing : Minimal assistance, Sitting Upper Body Dressing Details (indicate cue type and reason): Pt able to doff/don hospital gown at bed level. Lower Body Dressing: Moderate assistance, Sit to/from stand, +2 for safety/equipment Lower Body Dressing Details (indicate cue type and reason): Pt unable to reach feet to pull up socks secondary to pain in legs and lower back General ADL Comments: Pts daughter present for OT eval. Pt unable to identify that she had urinated in bed and was wet. Transfers were not attempted at this time; pt reports she is in too much pain to attempt standing.  Cognition: Cognition Overall Cognitive Status: Within Functional Limits for tasks assessed Orientation Level: Oriented X4 Cognition Arousal/Alertness: Lethargic Behavior During Therapy: WFL for tasks assessed/performed Overall Cognitive Status: Within Functional Limits for tasks assessed  Blood pressure 143/73, pulse 70, temperature 97.9 F (36.6 C), temperature source Oral, resp. rate 16, height 5' 2"  (1.575 m),  weight 62.823 kg (138 lb 8 oz), SpO2 100 %. Physical Exam  Constitutional: She is oriented to  person, place, and time. She appears well-developed.  HENT:  Nasogastric tube in place  Eyes:  Pupils round and reactive to light  Neck: Neck supple. No thyromegaly present.  Cardiovascular: Regular rhythm.  Respiratory:  Decreased breath sounds at the bases but clear to auscultation  GI: Soft. Bowel sounds are normal. She exhibits no distension.  Neurological: She is alert and oriented to person, place, and time.  Patient interacts to conversation. Follows simple commands. Fair awareness of deficits.  Skin: Skin is warm and dry.  Sensation absent LT in BUE and BLE Motor 3/5 in bilat delt bi tri grip, 2- HF, 3- knee ext 0 ADF Sensory ataxia in UEs   Lab Results Last 24 Hours    Results for orders placed or performed during the hospital encounter of 10/06/15 (from the past 24 hour(s))  Glucose, capillary Status: Abnormal   Collection Time: 10/20/15 8:00 AM  Result Value Ref Range   Glucose-Capillary 128 (H) 65 - 99 mg/dL   Comment 1 Capillary Specimen    Comment 2 Notify RN   Glucose, capillary Status: Abnormal   Collection Time: 10/20/15 11:36 AM  Result Value Ref Range   Glucose-Capillary 112 (H) 65 - 99 mg/dL   Comment 1 Capillary Specimen    Comment 2 Notify RN   Glucose, capillary Status: Abnormal   Collection Time: 10/20/15 4:12 PM  Result Value Ref Range   Glucose-Capillary 108 (H) 65 - 99 mg/dL  Glucose, capillary Status: Abnormal   Collection Time: 10/20/15 7:40 PM  Result Value Ref Range   Glucose-Capillary 113 (H) 65 - 99 mg/dL  Glucose, capillary Status: Abnormal   Collection Time: 10/21/15 12:57 AM  Result Value Ref Range   Glucose-Capillary 110 (H) 65 - 99 mg/dL  Glucose, capillary Status: Abnormal   Collection Time: 10/21/15 3:58 AM  Result Value Ref Range   Glucose-Capillary 121 (H) 65 - 99 mg/dL   Comment 1 Notify RN    Comment 2 Document in  Chart   Basic metabolic panel Status: Abnormal   Collection Time: 10/21/15 4:00 AM  Result Value Ref Range   Sodium 133 (L) 135 - 145 mmol/L   Potassium 4.3 3.5 - 5.1 mmol/L   Chloride 103 101 - 111 mmol/L   CO2 22 22 - 32 mmol/L   Glucose, Bld 108 (H) 65 - 99 mg/dL   BUN 20 6 - 20 mg/dL   Creatinine, Ser 0.46 0.44 - 1.00 mg/dL   Calcium 8.1 (L) 8.9 - 10.3 mg/dL   GFR calc non Af Amer >60 >60 mL/min   GFR calc Af Amer >60 >60 mL/min   Anion gap 8 5 - 15  CBC Status: Abnormal   Collection Time: 10/21/15 4:00 AM  Result Value Ref Range   WBC 10.0 4.0 - 10.5 K/uL   RBC 3.33 (L) 3.87 - 5.11 MIL/uL   Hemoglobin 9.5 (L) 12.0 - 15.0 g/dL   HCT 29.9 (L) 36.0 - 46.0 %   MCV 89.8 78.0 - 100.0 fL   MCH 28.5 26.0 - 34.0 pg   MCHC 31.8 30.0 - 36.0 g/dL   RDW 15.7 (H) 11.5 - 15.5 %   Platelets 265 150 - 400 K/uL      Imaging Results (Last 48 hours)    Dg Chest Port 1 View  10/20/2015 CLINICAL DATA: Guillain-Barre syndrome. EXAM: PORTABLE CHEST 1 VIEW COMPARISON: 10/19/2015. FINDINGS:  Right IJ sheath and feeding tube in stable position. Mediastinum and hilar structures are stable. Interim improvement of bibasilar atelectasis. Small bilateral pleural effusions. No pneumothorax. IMPRESSION: 1. Lines and tubes in stable position. 2. Interim partial clearing of bibasilar atelectasis. Small bilateral pleural effusions are again noted . Electronically Signed By: Marcello Moores Register On: 10/20/2015 07:49   Dg Abd Portable 1v  10/20/2015 CLINICAL DATA: Encounter for feeding tube placement. EXAM: PORTABLE ABDOMEN - 1 VIEW COMPARISON: October 14, 2015. FINDINGS: The bowel gas pattern is normal. Distal tip of feeding tube is seen in expected position of distal stomach or first portion of duodenum. IMPRESSION: Distal tip of feeding tube seen in expected position of distal stomach or first portion of  duodenum. Electronically Signed By: Marijo Conception, M.D. 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 Does the need for close, 24 hr/day medical supervision in concert with the patient's rehab needs make it unreasonable for this patient to be served in a less intensive setting? Yes 2. Co-Morbidities requiring supervision/potential complications: s/p colectomy for hx of colon ca, recent ablation of liver met 3. Due to bladder management, bowel management, safety, skin/wound care, disease management, medication administration, pain management and patient education, does the patient require 24 hr/day rehab nursing? Yes 4. Does the patient require coordinated care of a physician, rehab nurse, PT (1-2 hrs/day, 5 days/week), OT (1-2 hrs/day, 5 days/week) and SLP (.5-1 hrs/day, 5 days/week) to address physical and functional deficits in the context of the above medical diagnosis(es)? Yes Addressing deficits in the following areas: balance, endurance, locomotion, strength, transferring, bowel/bladder control, bathing, dressing, feeding, grooming, toileting, cognition, speech, language, swallowing and psychosocial support 5. Can the patient actively participate in an intensive therapy program of at least 3 hrs of therapy per day at least 5 days per week? not currently 6. The potential for patient to make measurable gains while on inpatient rehab is fair 7. Anticipated functional outcomes upon discharge from inpatient rehab are min assist and mod assist with PT, min assist and mod assist with OT, min assist and mod assist with SLP. 8. Estimated rehab length of stay to reach the above functional goals is: 22-28d 9. Does the patient have adequate social supports and living environment to accommodate these discharge functional goals? Potentially 10. Anticipated D/C setting: Home 11. Anticipated post D/C treatments: [place] therapy 12. Overall Rehab/Functional Prognosis: good  and fair  RECOMMENDATIONS: This patient's condition is appropriate for continued rehabilitative care in the following setting: CIR once rectal tube is out and off IV fentanyl Patient has agreed to participate in recommended program. Yes Note that insurance prior authorization may be required for reimbursement for recommended care.  Comment:     10/21/2015       Revision History     Date/Time User Provider Type Action   10/21/2015 3:21 PM Charlett Blake, MD Physician Sign   10/21/2015 6:42 AM Cathlyn Parsons, PA-C Physician Assistant Pend   View Details Report       Routing History     Date/Time From To Method   10/21/2015 3:21 PM Charlett Blake, MD Charlett Blake, MD In Basket   10/21/2015 3:21 PM Charlett Blake, MD Jonathon Jordan, MD Fax

## 2015-10-27 NOTE — Interval H&P Note (Signed)
Cynthia [name] was admitted today to Inpatient Rehabilitation with the diagnosis of GBS.  The patient's history has been reviewed, patient examined, and there is no change in status.  Patient continues to be appropriate for intensive inpatient rehabilitation.  I have reviewed the patient's chart and labs.  Questions were answered to the patient's satisfaction. The PAPE has been reviewed and assessment remains appropriate.  Ankit Lorie Phenix 10/27/2015, 5:28 PM

## 2015-10-27 NOTE — Progress Notes (Signed)
Updated therapy assessments have been submitted to [facility] this morning. I await their determination if pt will be approved for an inpt rehab admission today. [id number]

## 2015-10-27 NOTE — H&P (View-Only) (Signed)
Physical Medicine and Rehabilitation Admission H&P    Chief Complaint  Patient presents with  . GBS   HPI:  Cynthia [name] is an 80 y.o. female with metastatic colon cancer s/p surgery and chemotherapy, recurrent C-diff, HTN, stroke with lleft sided weakness/numbness, DVT who was admitted on 10/06/15 with complaints of dizziness, paraesthesias of right arm and right leg, numbness of hands and feet, BLE weakness and difficulty with walking.  She underwent thermal ablation of liver metastatic lesion on 09/25/15 and has been having low grade fevers PTA.  MRI brain negative for stroke.  She developed hypotension with hypotension and large amount of coffee ground emesis on 02/24 and Gi consulted for input. Dr. Paulita Fujita felt symptoms due to esophagitis/gastritis from plavix and recommended serial H/H and hydration.   She was intubated 02/26- 03/05 due to respiratory insufficiency and was treated for HCAP with Vanc/Zosyn.  MRI Cervical/thoracic/lumbar spine showed severe neural foraminal stenosis R-C4, R-C5, B-C6 and L-C7 nerve roots and peripheral enhancement of distal thoracic spinal cord is well nerve roots with anterior and posterior suggestive of GBS. Due to areflexia of BLE and hypoactive UE reflexes she was treated with IVIG X 5 doses.  She has had visual and tactile hallucinations felt to be due to opiates.    She underwent EGD 03/1 showing question of proximal linear ulcer question from NGT and she was cleared to resume blood thinners.   BUE strength has been improving and respiratory status stable. FEES 03/6 Showed severe dysphagia and patient kept NPO with tube feeds for nutritional support. Repeat MBS done 03/10 and she was started on  She continues to have intermittent hallucinations/confusion question due to Citrobacter UTI and was started on Ceftriaxone 3/9. Had MS changes with hypoxia and lethargy therefore EEG done 03/11 showing mild generalized slowing likely due to metabolic or  toxic encephalopathy v/s degenerative central nervous disorders.  Psychiatry consulted for input and medications adjusted.  She continues to be limited by motor weakness, ongoing issues with insomnia and confusion, poor po intake as well as pain and fatigue. CIR recommended for follow up therapy and patient admitted today.    Review of Systems  Constitutional:       Poor appetite  HENT: Negative for hearing loss.   Eyes: Negative for blurred vision and double vision.  Respiratory: Negative for cough, sputum production and shortness of breath.   Cardiovascular: Negative for chest pain and palpitations.  Gastrointestinal: Negative for heartburn, nausea and diarrhea.  Genitourinary: Negative for dysuria and urgency.  Musculoskeletal: Positive for myalgias.  Skin: Negative for rash.  Neurological: Positive for tingling, sensory change, focal weakness and weakness. Negative for headaches.  Psychiatric/Behavioral: The patient is nervous/anxious and has insomnia.   All other systems reviewed and are negative.     Past Medical History  Diagnosis Date  . Anemia   . Blood transfusion without reported diagnosis jan 2015  . Heart murmur   . Hypertension     no bp meds   . Liver cyst   . C. difficile colitis 03/05/14, 03/22/14, 04/05/14    recurrent  . Stroke ([place]) 12/2014    NUMBNESS ON LEFT SIDE  . Cervical cancer ([place]) 1972  . Colon cancer ([place]) JAN 2015  . Colon cancer ([place])   . Cancer ([place])     liver  . Complication of anesthesia     slow to awaken in past, DONE WELL RECENTLY  . Tinnitus of both ears ALL THE TIME  .  DVT (deep venous thrombosis) ([place]) 10/24/13    Right leg    Past Surgical History  Procedure Laterality Date  . Back surgery  1981    lower lumbar  . Tubal ligation  1968  . Laparoscopic right colectomy Right 10/22/2013    Procedure: LAPAROSCOPIC RIGHT COLECTOMY ;  Surgeon: [name], MD;  Location: [facility];  Service: General;  Laterality: Right;  .  Salpingoophorectomy  10/22/2013    Procedure: Mar[place] Saa;  Surgeon: [name]. [name], MD;  Location: [facility];  Service: Gynecology;;  . Abdominal hysterectomy      vaginal- partial  . Esophagogastroduodenoscopy N/A 10/14/2015    Procedure: ESOPHAGOGASTRODUODENOSCOPY (EGD);  Surgeon: Clarene Essex, MD;  Location: [facility];  Service: Endoscopy;  Laterality: N/A;  Bedside in ICU    Family History  Problem Relation Age of Onset  . Diabetes Sister   . Cancer Sister     leukemia  . Diabetes Brother   . Heart failure Brother   . Hypertension Brother   . Breast cancer Maternal Aunt   . Rheum arthritis Mother   . Heart failure Father   . Cancer Maternal Grandmother     GIST  . Heart failure Maternal Grandfather   . Aneurysm Paternal Grandmother     Social History:  reports that she has never smoked. She has never used smokeless tobacco. She reports that she does not drink alcohol or use illicit drugs.    Allergies: No Known Allergies    Medications Prior to Admission  Medication Sig Dispense Refill  . acetaminophen (TYLENOL) 500 MG tablet Take 500 mg by mouth every 6 (six) hours as needed for mild pain.    Marland Kitchen aspirin EC 81 MG tablet Take 1 tablet (81 mg total) by mouth daily. (Patient taking differently: Take 81 mg by mouth every morning. ) 90 tablet 0  . atorvastatin (LIPITOR) 40 MG tablet Take 1 tablet (40 mg total) by mouth daily. 90 tablet 0  . metoprolol succinate (TOPROL-XL) 25 MG 24 hr tablet Take 37.5 mg by mouth every evening.     . Multiple Vitamin (MULTIVITAMIN WITH MINERALS) TABS tablet Take 1 tablet by mouth every morning.       Home: Home Living Family/patient expects to be discharged to:: Inpatient rehab Living Arrangements: Children (daughter) Available Help at Discharge: Family, Available 24 hours/day (between daughter and family members) Type of Home: House Home Access: Stairs to enter Technical brewer of Steps: 1 Entrance Stairs-Rails:  None Home Layout: One level Bathroom Shower/Tub: Chiropodist: Standard Bathroom Accessibility: Yes Home Equipment: Environmental consultant - 2 wheels, Environmental consultant - standard, Cane - quad, Crutches, Bedside commode, Shower seat, Grab bars - tub/shower   Functional History: Prior Function Level of Independence: Independent  Functional Status:  Mobility: Bed Mobility Overal bed mobility: Needs Assistance, +2 for physical assistance Bed Mobility: Supine to Sit, Sit to Supine Rolling: Max assist, +2 for physical assistance Sidelying to sit: Total assist, +2 for physical assistance Supine to sit: Total assist, HOB elevated Sit to supine: Total assist, +2 for physical assistance Sit to sidelying: Min assist (assist to bring LEs onto bed) General bed mobility comments: Pt found seated in recliner after nursing staff assisted with getting her OOB Transfers Overall transfer level: Needs assistance Transfer via Lift Equipment: Maximove Transfers: Lateral/Scoot Transfers  Lateral/Scoot Transfers: Max assist General transfer comment: Did not occur, maxi move used to transfer patient by nursing staff prior to OT entering room  ADL: ADL Overall ADL's : Needs assistance/impaired Eating/Feeding: Maximal assistance, Total assistance, Bed level Eating/Feeding Details (indicate cue type and reason): hand over hand assist until pt too tired to perform Grooming: Maximal assistance, Total assistance, Wash/dry hands, Wash/dry face, Oral care, Brushing hair Grooming Details (indicate cue type and reason): hand over hand assist until pt too tired to perform Upper Body Bathing: Total assistance, Sitting Upper Body Bathing Details (indicate cue type and reason): supported in recliner, recommending bed level ADL at this time Lower Body Bathing: Total assistance, +2 for physical assistance, +2 for safety/equipment, Sitting/lateral leans Lower Body Bathing Details (indicate cue type and reason):  supported in recliner, recommending bed level ADL at this time Upper Body Dressing : Total assistance, Sitting Upper Body Dressing Details (indicate cue type and reason): supported in recliner, recommending bed level ADL at this time Lower Body Dressing: Total assistance, +2 for physical assistance, +2 for safety/equipment, Sitting/lateral leans Lower Body Dressing Details (indicate cue type and reason): supported in recliner, recommending bed level ADL at this time Toilet Transfer Details (indicate cue type and reason): did not occur, pt requires lift transfer for OOB, recommend bedpan a this time Toileting- Clothing Manipulation and Hygiene: Total assistance, Bed level, +2 for safety/equipment Tub/Shower Transfer Details (indicate cue type and reason): did not occur, pt requires lift transfer for OOB General ADL Comments: Pt found seated in recliner after nursing staff assisted pt into recliner via lift transfer. At this time, recommend pt complete bed level ADL for patient and staff safety. Recommend bedpan at this time as well.   Cognition: Cognition Overall Cognitive Status: Within Functional Limits for tasks assessed (a little slow to process) Orientation Level: Oriented X4 Cognition Arousal/Alertness: Awake/alert Behavior During Therapy: WFL for tasks assessed/performed Overall Cognitive Status: Within Functional Limits for tasks assessed (a little slow to process)  Physical Exam: Blood pressure 127/70, pulse 70, temperature 97.3 F (36.3 C), temperature source Oral, resp. rate 20, height 5' 2" (1.575 m), weight 58.106 kg (128 lb 1.6 oz), SpO2 98 %. Physical Exam  Nursing note and vitals reviewed. Constitutional: She is oriented to person, place, and time. She appears well-developed and well-nourished.  Anxious appearing elderly female.   HENT:  Head: Normocephalic and atraumatic.  Lips with dry flaky skin and dry oral mucous membranes.   Eyes: Conjunctivae and EOM are normal.  Pupils are equal, round, and reactive to light.  Neck: Normal range of motion. Neck supple.  Cardiovascular: Normal rate and regular rhythm.   No murmur heard. Respiratory: Effort normal and breath sounds normal. No respiratory distress. She has no wheezes.  GI: Soft. Bowel sounds are normal. She exhibits no distension. There is no tenderness.  Musculoskeletal: She exhibits edema. She exhibits no tenderness.  Neurological: She is alert and oriented to person, place, and time.  Speech clear.  Oriented to month, year, place, pres, DOB.  Able to follow basic commands without difficulty.   Weak voice DTRs diminished lower extremities or the upper extremities R UE: Shoulder abduction, elbow flexion/extension 3+/5, wrist extension, hand grip 3/5 LUE: Shoulder abduction, elbow flexion/extension 3+/5, wrist extension, hand grip 2+/5 RLE: Hip flexion 3/5, knee extension 2/5, ankle dorsi/plantar flexion 0/5 LLE: Hip flexion 3/5, knee extension 0/5, ankle dorsi/plantar flexion 0/5 Numbness L5/S1 distribution bilaterally.   Skin: Skin is warm and dry.  Psychiatric: Her behavior is normal. Thought content normal. Her mood appears anxious.    Results for orders placed or performed during the hospital encounter of 10/06/15 (from the   past 48 hour(s))  Glucose, capillary     Status: Abnormal   Collection Time: 10/25/15  4:48 PM  Result Value Ref Range   Glucose-Capillary 116 (H) 65 - 99 mg/dL  Blood gas, arterial     Status: Abnormal   Collection Time: 10/25/15  9:14 PM  Result Value Ref Range   FIO2 0.21    pH, Arterial 7.446 7.350 - 7.450   pCO2 arterial 35.4 35.0 - 45.0 mmHg   pO2, Arterial 74.8 (L) 80.0 - 100.0 mmHg   Bicarbonate 24.0 20.0 - 24.0 mEq/L   TCO2 25.1 0 - 100 mmol/L   Acid-Base Excess 0.4 0.0 - 2.0 mmol/L   O2 Saturation 95.0 %   Patient temperature 98.6    Collection site BRACHIAL ARTERY    Drawn by [id number]    Sample type ARTERIAL DRAW    Allens test (pass/fail) PASS PASS    Glucose, capillary     Status: Abnormal   Collection Time: 10/25/15  9:54 PM  Result Value Ref Range   Glucose-Capillary 110 (H) 65 - 99 mg/dL  Procalcitonin     Status: None   Collection Time: 10/26/15  4:20 AM  Result Value Ref Range   Procalcitonin <0.10 ng/mL    Comment:        Interpretation: PCT (Procalcitonin) <= 0.5 ng/mL: Systemic infection (sepsis) is not likely. Local bacterial infection is possible. (NOTE)         ICU PCT Algorithm               Non ICU PCT Algorithm    ----------------------------     ------------------------------         PCT < 0.25 ng/mL                 PCT < 0.1 ng/mL     Stopping of antibiotics            Stopping of antibiotics       strongly encouraged.               strongly encouraged.    ----------------------------     ------------------------------       PCT level decrease by               PCT < 0.25 ng/mL       >= 80% from peak PCT       OR PCT 0.25 - 0.5 ng/mL          Stopping of antibiotics                                             encouraged.     Stopping of antibiotics           encouraged.    ----------------------------     ------------------------------       PCT level decrease by              PCT >= 0.25 ng/mL       < 80% from peak PCT        AND PCT >= 0.5 ng/mL            Continuin g antibiotics  encouraged.       Continuing antibiotics            encouraged.    ----------------------------     ------------------------------     PCT level increase compared          PCT > 0.5 ng/mL         with peak PCT AND          PCT >= 0.5 ng/mL             Escalation of antibiotics                                          strongly encouraged.      Escalation of antibiotics        strongly encouraged.   Glucose, capillary     Status: None   Collection Time: 10/26/15  8:30 AM  Result Value Ref Range   Glucose-Capillary 99 65 - 99 mg/dL   Comment 1 Notify RN    Comment 2 Document in  Chart   Glucose, capillary     Status: Abnormal   Collection Time: 10/26/15 11:43 AM  Result Value Ref Range   Glucose-Capillary 128 (H) 65 - 99 mg/dL  Glucose, capillary     Status: Abnormal   Collection Time: 10/26/15  5:32 PM  Result Value Ref Range   Glucose-Capillary 108 (H) 65 - 99 mg/dL   Comment 1 Notify RN    Comment 2 Document in Chart   Glucose, capillary     Status: None   Collection Time: 10/26/15  9:01 PM  Result Value Ref Range   Glucose-Capillary 93 65 - 99 mg/dL  Procalcitonin     Status: None   Collection Time: 10/27/15  4:58 AM  Result Value Ref Range   Procalcitonin <0.10 ng/mL    Comment:        Interpretation: PCT (Procalcitonin) <= 0.5 ng/mL: Systemic infection (sepsis) is not likely. Local bacterial infection is possible. (NOTE)         ICU PCT Algorithm               Non ICU PCT Algorithm    ----------------------------     ------------------------------         PCT < 0.25 ng/mL                 PCT < 0.1 ng/mL     Stopping of antibiotics            Stopping of antibiotics       strongly encouraged.               strongly encouraged.    ----------------------------     ------------------------------       PCT level decrease by               PCT < 0.25 ng/mL       >= 80% from peak PCT       OR PCT 0.25 - 0.5 ng/mL          Stopping of antibiotics                                             encouraged.     Stopping of antibiotics  encouraged.    ----------------------------     ------------------------------       PCT level decrease by              PCT >= 0.25 ng/mL       < 80% from peak PCT        AND PCT >= 0.5 ng/mL            Continuin g antibiotics                                              encouraged.       Continuing antibiotics            encouraged.    ----------------------------     ------------------------------     PCT level increase compared          PCT > 0.5 ng/mL         with peak PCT AND          PCT >= 0.5 ng/mL              Escalation of antibiotics                                          strongly encouraged.      Escalation of antibiotics        strongly encouraged.   Basic metabolic panel     Status: Abnormal   Collection Time: 10/27/15  4:58 AM  Result Value Ref Range   Sodium 137 135 - 145 mmol/L   Potassium 3.8 3.5 - 5.1 mmol/L   Chloride 103 101 - 111 mmol/L   CO2 25 22 - 32 mmol/L   Glucose, Bld 100 (H) 65 - 99 mg/dL   BUN 19 6 - 20 mg/dL   Creatinine, Ser 0.62 0.44 - 1.00 mg/dL   Calcium 8.7 (L) 8.9 - 10.3 mg/dL   GFR calc non Af Amer >60 >60 mL/min   GFR calc Af Amer >60 >60 mL/min    Comment: (NOTE) The eGFR has been calculated using the CKD EPI equation. This calculation has not been validated in all clinical situations. eGFR's persistently <60 mL/min signify possible Chronic Kidney Disease.    Anion gap 9 5 - 15  CBC     Status: Abnormal   Collection Time: 10/27/15  4:58 AM  Result Value Ref Range   WBC 6.0 4.0 - 10.5 K/uL   RBC 3.54 (L) 3.87 - 5.11 MIL/uL   Hemoglobin 10.1 (L) 12.0 - 15.0 g/dL   HCT 32.3 (L) 36.0 - 46.0 %   MCV 91.2 78.0 - 100.0 fL   MCH 28.5 26.0 - 34.0 pg   MCHC 31.3 30.0 - 36.0 g/dL   RDW 16.2 (H) 11.5 - 15.5 %   Platelets 438 (H) 150 - 400 K/uL  Glucose, capillary     Status: None   Collection Time: 10/27/15  7:40 AM  Result Value Ref Range   Glucose-Capillary 99 65 - 99 mg/dL  Glucose, capillary     Status: Abnormal   Collection Time: 10/27/15 11:26 AM  Result Value Ref Range   Glucose-Capillary 102 (H) 65 - 99 mg/dL   No results found.   Medical Problem List and Plan: 1.  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 2.  DVT Prophylaxis/Anticoagulation: Pharmaceutical: Lovenox 3. Pain Management: Tylenol prn. Limit narcotics.  4. Mood: LCSW to follow for evaluation and support.  5. Neuropsych: This patient is not fully capable of making decisions on her own behalf. 6. Skin/Wound Care: Routine pressure relief measures.  7.  Fluids/Electrolytes/Nutrition: Monitor I/O. Check lytes in am. Offer supplements between meals.  8.  Citrobacter Koseri UTI: On Ceftriaxone D#6/7. Discontinue foley and start bladder training. Avoid prolonged antibiotics due to history of C diff.  9. Colon cancer with liver mets: No complaints of pain.   10. UGIB with ABLA: H/H stable and anemia resolving. Continue to monitor for any recurrent signs of bleeding.    Post Admission Physician Evaluation: 1. Functional deficits secondary  to GBS. 2. Patient is admitted to receive collaborative, interdisciplinary care between the physiatrist, rehab nursing staff, and therapy team. 3. Patient's level of medical complexity and substantial therapy needs in context of that medical necessity cannot be provided at a lesser intensity of care such as a SNF. 4. Patient has experienced substantial functional loss from his/her baseline which was documented above under the "Functional History" and "Functional Status" headings.  Judging by the patient's diagnosis, physical exam, and functional history, the patient has potential for functional progress which will result in measurable gains while on inpatient rehab.  These gains will be of substantial and practical use upon discharge  in facilitating mobility and self-care at the household level. 5. Physiatrist will provide 24 hour management of medical needs as well as oversight of the therapy plan/treatment and provide guidance as appropriate regarding the interaction of the two. 6. 24 hour rehab nursing will assist with bladder management, bowel management, safety, skin/wound care, disease management, medication administration, pain management and patient education and help integrate therapy concepts, techniques,education, etc. 7. PT will assess and treat for/with: Lower extremity strength, range of motion, stamina, balance, functional mobility, safety, adaptive techniques and equipment, woundcare, coping skills, pain  control, education.   Goals are: Mod/min assist. 8. OT will assess and treat for/with: ADL's, functional mobility, safety, upper extremity strength, adaptive techniques and equipment, wound mgt, ego support, and community reintegration.   Goals are: Mod/min assist. Therapy may proceed with showering this patient. 9. SLP will assess and treat for/with: Speech, high-level cognition, swallowing.  Goals are: Min/mod assist. 10. Case Management and Social Worker will assess and treat for psychological issues and discharge planning. 11. Team conference will be held weekly to assess progress toward goals and to determine barriers to discharge. 12. Patient will receive at least 3 hours of therapy per day at least 5 days per week. 13. ELOS: 20-25 days.       14. Prognosis:  good   , MD 10/27/2015 

## 2015-10-28 ENCOUNTER — Inpatient Hospital Stay (HOSPITAL_COMMUNITY): Payer: Medicare Other | Admitting: Occupational Therapy

## 2015-10-28 ENCOUNTER — Ambulatory Visit (HOSPITAL_COMMUNITY): Payer: Medicare Other

## 2015-10-28 ENCOUNTER — Inpatient Hospital Stay (HOSPITAL_COMMUNITY): Payer: Medicare Other | Admitting: Physical Therapy

## 2015-10-28 ENCOUNTER — Inpatient Hospital Stay (HOSPITAL_COMMUNITY): Payer: Medicare Other | Admitting: Speech Pathology

## 2015-10-28 DIAGNOSIS — G8250 Quadriplegia, unspecified: Principal | ICD-10-CM

## 2015-10-28 LAB — CBC WITH DIFFERENTIAL/PLATELET
BASOS PCT: 0 %
Basophils Absolute: 0 10*3/uL (ref 0.0–0.1)
EOS ABS: 0.1 10*3/uL (ref 0.0–0.7)
Eosinophils Relative: 1 %
HEMATOCRIT: 32.8 % — AB (ref 36.0–46.0)
Hemoglobin: 10.7 g/dL — ABNORMAL LOW (ref 12.0–15.0)
Lymphocytes Relative: 15 %
Lymphs Abs: 1 10*3/uL (ref 0.7–4.0)
MCH: 29.8 pg (ref 26.0–34.0)
MCHC: 32.6 g/dL (ref 30.0–36.0)
MCV: 91.4 fL (ref 78.0–100.0)
MONO ABS: 0.4 10*3/uL (ref 0.1–1.0)
MONOS PCT: 6 %
Neutro Abs: 5.4 10*3/uL (ref 1.7–7.7)
Neutrophils Relative %: 78 %
Platelets: 405 10*3/uL — ABNORMAL HIGH (ref 150–400)
RBC: 3.59 MIL/uL — ABNORMAL LOW (ref 3.87–5.11)
RDW: 16.2 % — AB (ref 11.5–15.5)
WBC: 7 10*3/uL (ref 4.0–10.5)

## 2015-10-28 LAB — COMPREHENSIVE METABOLIC PANEL
ALBUMIN: 2 g/dL — AB (ref 3.5–5.0)
ALT: 20 U/L (ref 14–54)
AST: 23 U/L (ref 15–41)
Alkaline Phosphatase: 42 U/L (ref 38–126)
Anion gap: 7 (ref 5–15)
BILIRUBIN TOTAL: 0.9 mg/dL (ref 0.3–1.2)
BUN: 13 mg/dL (ref 6–20)
CALCIUM: 8.4 mg/dL — AB (ref 8.9–10.3)
CO2: 25 mmol/L (ref 22–32)
Chloride: 101 mmol/L (ref 101–111)
Creatinine, Ser: 0.47 mg/dL (ref 0.44–1.00)
GFR calc non Af Amer: >60 mL/min (ref >60)
GLUCOSE: 94 mg/dL (ref 65–99)
POTASSIUM: 4 mmol/L (ref 3.5–5.1)
SODIUM: 133 mmol/L — AB (ref 135–145)
TOTAL PROTEIN: 6.1 g/dL — AB (ref 6.5–8.1)

## 2015-10-28 NOTE — Evaluation (Signed)
Physical Therapy Assessment and Plan  Patient Details  Name: Cynthia [name] MRN: [id number] Date of Birth: [birth year]  PT Diagnosis: Abnormal posture, Abnormality of gait, Cognitive deficits, Difficulty walking, Impaired sensation, Muscle weakness, Pain in bilateral lower extremities and Quadriplegia Rehab Potential: Fair ELOS: 24-27 days   Today's Date: 10/28/2015 PT Individual Time: 0900-0955 PT Individual Time Calculation (min): 55 min    Problem List:  Patient Active Problem List   Diagnosis Date Noted  . Quadriplegia and quadriparesis ([place])   . Numbness and tingling   . Paresthesias   . UTI (lower urinary tract infection)   . History of colon cancer   . Acute blood loss anemia   . Upper GI bleed   . GBS (Guillain-Barre syndrome) ([place])   . Aspiration into airway   . Endotracheally intubated   . Acute respiratory failure ([place])   . Shock ([place]) 10/09/2015  . Hemoptysis   . Transverse myelitis ([place])   . Peripheral neuropathy ([place]) 10/06/2015  . Paresthesia of both hands 10/06/2015  . Paresthesia of both feet 10/06/2015  . Cerebral embolism with cerebral infarction 10/06/2015  . Ataxia   . Colon carcinoma metastatic to liver ([place]) 09/25/2015  . Metastasis to liver ([place]) 08/28/2015  . Colon cancer metastasized to liver ([place])   . Liver lesion   . Cerebral infarction due to unspecified mechanism 01/29/2015  . B12 deficiency 01/29/2015  . Essential hypertension 01/29/2015  . Arm numbness left   . Disorientation   . Numbness and tingling of left side of face   . TIA (transient ischemic attack) 01/11/2015  . Confusion   . Left arm numbness   . DVT, lower extremity, distal ([place]) 10/27/2013  . T4a, N0 09/27/2013  . Iron deficiency anemia 09/04/2013  . Symptomatic anemia 09/03/2013  . Orthostasis 09/03/2013  . Near syncope 09/03/2013    Past Medical History:  Past Medical History  Diagnosis Date  . Anemia   . Blood transfusion without reported diagnosis jan 2015   . Heart murmur   . Hypertension     no bp meds   . Liver cyst   . C. difficile colitis 03/05/14, 03/22/14, 04/05/14    recurrent  . Stroke ([place]) 12/2014    NUMBNESS ON LEFT SIDE  . Cervical cancer ([place]) 1972  . Colon cancer ([place]) JAN 2015  . Colon cancer ([place])   . Cancer ([place])     liver  . Complication of anesthesia     slow to awaken in past, DONE WELL RECENTLY  . Tinnitus of both ears ALL THE TIME  . DVT (deep venous thrombosis) ([place]) 10/24/13    Right leg   Past Surgical History:  Past Surgical History  Procedure Laterality Date  . Back surgery  1981    lower lumbar  . Tubal ligation  1968  . Laparoscopic right colectomy Right 10/22/2013    Procedure: LAPAROSCOPIC RIGHT COLECTOMY ;  Surgeon: [name], MD;  Location: [facility] ORS;  Service: General;  Laterality: Right;  . Salpingoophorectomy  10/22/2013    Procedure: [place] Saa;  Surgeon: [name]. [name], MD;  Location: [facility]S;  Service: Gynecology;;  . Abdominal hysterectomy      vaginal- partial  . Esophagogastroduodenoscopy N/A 10/14/2015    Procedure: ESOPHAGOGASTRODUODENOSCOPY (EGD);  Surgeon: Clarene Essex, MD;  Location: [facility] ENDOSCOPY;  Service: Endoscopy;  Laterality: N/A;  Bedside in ICU    Assessment & Plan Clinical Impression: [name] is an 80 y.o. female with metastatic colon  cancer s/p surgery and chemotherapy, recurrent C-diff, HTN, stroke with lleft sided weakness/numbness, DVT who was admitted on 10/06/15 with complaints of dizziness, paraesthesias of right arm and right leg, numbness of hands and feet, BLE weakness and difficulty with walking. She underwent thermal ablation of liver metastatic lesion on 09/25/15 and has been having low grade fevers PTA. MRI brain negative for stroke.  She developed hypotension with hypotension and large amount of coffee ground emesis on 02/24 and Gi consulted for input. Dr. Paulita Fujita felt symptoms due to esophagitis/gastritis from plavix and recommended  serial H/H and hydration.   She was intubated 02/26- 03/05 due to respiratory insufficiency and was treated for HCAP with Vanc/Zosyn. MRI Cervical/thoracic/lumbar spine showed severe neural foraminal stenosis R-C4, R-C5, B-C6 and L-C7 nerve roots and peripheral enhancement of distal thoracic spinal cord is well nerve roots with anterior and posterior suggestive of GBS. Due to areflexia of BLE and hypoactive UE reflexes she was treated with IVIG X 5 doses. She has had visual and tactile hallucinations felt to be due to opiates. She underwent EGD 03/1 showing question of proximal linear ulcer question from NGT and she was cleared to resume blood thinners.   BUE strength has been improving and respiratory status stable. FEES 03/6 Showed severe dysphagia and patient kept NPO with tube feeds for nutritional support. Repeat MBS done 03/10 and she was started on  She continues to have intermittent hallucinations/confusion question due to Citrobacter UTI and was started on Ceftriaxone 3/9. Had MS changes with hypoxia and lethargy therefore EEG done 03/11 showing mild generalized slowing likely due to metabolic or toxic encephalopathy v/s degenerative central nervous disorders. Psychiatry consulted for input and medications adjusted. She continues to be limited by motor weakness, ongoing issues with insomnia and confusion, poor po intake as well as pain and fatigue. CIR recommended for follow up therapy and patient admitted today. Patient transferred to CIR on 10/27/2015.   Patient currently requires total with mobility secondary to muscle weakness, muscle joint tightness and muscle paralysis, decreased cardiorespiratoy endurance, impaired timing and sequencing, abnormal tone, unbalanced muscle activation and decreased coordination, decreased initiation, decreased attention, decreased awareness, decreased problem solving, decreased safety awareness, decreased memory and delayed processing and decreased sitting  balance, decreased standing balance, decreased postural control and decreased balance strategies.  Prior to hospitalization, patient was independent  with mobility and lived with Daughter in a House home.  Home access is 1Stairs to enter.  Patient will benefit from skilled PT intervention to maximize safe functional mobility, minimize fall risk and decrease caregiver burden for planned discharge home with 24 hour assist.  Anticipate patient will benefit from follow up [facility] at discharge.  PT - End of Session Activity Tolerance: Tolerates < 10 min activity, no significant change in vital signs Endurance Deficit: Yes Endurance Deficit Description: fatigued easily even with task of reporting hx PT Assessment Rehab Potential (ACUTE/IP ONLY): Fair Barriers to Discharge: Decreased caregiver support PT Patient demonstrates impairments in the following area(s): Balance;Edema;Endurance;Motor;Nutrition;Pain;Safety;Sensory PT Transfers Functional Problem(s): Bed Mobility;Bed to Chair;Car;Furniture PT Locomotion Functional Problem(s): Ambulation;Wheelchair Mobility;Stairs PT Plan PT Intensity: Minimum of 1-2 x/day ,45 to 90 minutes PT Frequency: 5 out of 7 days PT Duration Estimated Length of Stay: 24-27 days PT Treatment/Interventions: Ambulation/gait training;Balance/vestibular training;DME/adaptive equipment instruction;Community reintegration;Discharge planning;Disease management/prevention;Functional electrical stimulation;Functional mobility training;Neuromuscular re-education;Pain management;Psychosocial support;Patient/family education;Splinting/orthotics;Stair training;Therapeutic Activities;Therapeutic Exercise;UE/LE Coordination activities;UE/LE Strength taining/ROM;Wheelchair propulsion/positioning PT Transfers Anticipated Outcome(s): mod A PT Locomotion Anticipated Outcome(s): mod A wheelchair level PT Recommendation Recommendations for Other Services:  Neuropsych consult Follow Up  Recommendations: Home health PT;24 hour supervision/assistance Patient destination: Home Equipment Recommended: Wheelchair (measurements);Wheelchair cushion (measurements);To be determined  Skilled Therapeutic Intervention Skilled therapeutic intervention initiated after completion of evaluation. Educated patient on purpose of PT, plan of care, and goals. Patient in bed without PRAFOs (in corner) and educated on importance of positioning and need for PRAFOs when patient in bed, verbalized understanding. Patient engaged in rolling to R and L with max verbal/visual cues to hook UEs on rails with multiple attempts and total A +2 with inconsistent and extreme cramping/hypersensitive pain in LLE > RLE with PROM of lower extremities, RN made aware. Attempted to transfer supine > sit with total A +2 but patient unable to tolerate having BLE moved and BLE in dependent position, shouting out in pain, therefore patient returned to supine and required +3 to reposition in bed in order to have one person strictly responsible for managing BLE. PT searched for appropriate wheelchair to trial with patient when appropriate but was unable to locate TIS wheelchair with elevating leg rests as patient unable to tolerate BLE in dependent position at this time. Will continue to assess most appropriate seating and positioning for patient as able. Patient fatigued from effort and pain and left supine in bed with soft call bell on chest and bed alarm on.  PT Evaluation Precautions/Restrictions Precautions Precautions: Fall Precaution Comments: quadriparesis, move BLE very carefully due to pain Restrictions Weight Bearing Restrictions: No General Chart Reviewed: Yes PT Amount of Missed Time (min): 20 Minutes PT Missed Treatment Reason: Patient fatigue;Pain Family/Caregiver Present: No Vital Signs  Pain Pain Assessment Pain Assessment: Faces Faces Pain Scale: Hurts worst Pain Type: Acute pain;Neuropathic pain Pain  Location: Leg Pain Orientation: Right;Left Pain Descriptors / Indicators: Cramping;Sharp;Tingling Pain Onset: With Activity (moving BLE) Pain Intervention(s): RN made aware;Repositioned;Rest Home Living/Prior Functioning Home Living Available Help at Discharge: Family;Available 24 hours/day (between daughter and family members) Type of Home: House Home Access: Stairs to enter Technical brewer of Steps: 1 Entrance Stairs-Rails: None Home Layout: One level Bathroom Shower/Tub: Chiropodist: Standard Bathroom Accessibility: Yes  Lives With: Daughter Prior Function Level of Independence: Independent with basic ADLs;Independent with transfers;Independent with gait Vision/Perception   No change from baseline  Cognition Overall Cognitive Status: Impaired/Different from baseline Arousal/Alertness: Lethargic Attention: Focused;Sustained Focused Attention: Appears intact Sustained Attention: Impaired Sustained Attention Impairment: Verbal basic;Functional basic Memory: Impaired Memory Impairment: Decreased short term memory;Decreased recall of new information Decreased Short Term Memory: Verbal basic;Functional basic Awareness: Impaired Awareness Impairment: Emergent impairment Problem Solving: Impaired Problem Solving Impairment: Verbal basic;Functional basic Executive Function: Initiating Initiating: Impaired Initiating Impairment: Functional basic Behaviors: Restless;Poor frustration tolerance;Perseveration Safety/Judgment: Impaired Sensation Sensation Light Touch: Impaired by gross assessment Stereognosis: Impaired by gross assessment Hot/Cold: Impaired by gross assessment Proprioception: Impaired by gross assessment Additional Comments: Decreased sensation/proprioception R/L lower extremity, absent sensation/proprioception B feet  Coordination Gross Motor Movements are Fluid and Coordinated: No Fine Motor Movements are Fluid and Coordinated:  No Motor  Motor Motor: Abnormal postural alignment and control;Abnormal tone;Tetraplegia  Mobility Bed Mobility Bed Mobility: Rolling Right;Rolling Left Rolling Right: 1: +2 Total assist;With rail Rolling Left: 1: +2 Total assist;With rail Transfers Transfers: No (unable to tolerate sitting EOB/upright position due to BLE pain) Locomotion  Ambulation Ambulation: No Gait Gait: No Stairs / Additional Locomotion Stairs: No Wheelchair Mobility Wheelchair Mobility: No  Trunk/Postural Assessment   Difficult to assess, patient unable to tolerate sitting edge of bed or OOB activity due to pain in  BLE Balance Balance Balance Assessed: Yes Static Sitting Balance Static Sitting - Balance Support: Bilateral upper extremity supported Static Sitting - Level of Assistance: 1: +2 Total assist Static Sitting - Comment/# of Minutes: < 1 minute, could not tolerate sitting EOB with BLE in dependent position/pain with all movement of BLE Extremity Assessment  RUE Assessment RUE Assessment: Exceptions to [facility] (2+/5 throughout) LUE Assessment LUE Assessment: Exceptions to [facility] (2+/5 throughout) RLE Assessment RLE Assessment: Exceptions to [facility] RLE Strength RLE Overall Strength: Deficits RLE Overall Strength Comments: 2-/5 hip flexion, 0/5 knee flexion/extension, 0/5 ankle DF/PF LLE Assessment LLE Assessment: Exceptions to [facility] LLE Strength LLE Overall Strength: Deficits LLE Overall Strength Comments: 2-/5 hip flexion, 0/5 knee flexion/extension, 0/5 ankle DF/PF   See Function Navigator for Current Functional Status.   Refer to Care Plan for Long Term Goals  Recommendations for other services: Neuropsych  Discharge Criteria: Patient will be discharged from PT if patient refuses treatment 3 consecutive times without medical reason, if treatment goals not met, if there is a change in medical status, if patient makes no progress towards goals or if patient is discharged from hospital.  The  above assessment, treatment plan, treatment alternatives and goals were discussed and mutually agreed upon: by patient  Laretta Alstrom 10/28/2015, 10:14 AM

## 2015-10-28 NOTE — Progress Notes (Signed)
80 y.o. female with metastatic colon cancer s/p surgery and chemotherapy, recurrent C-diff, HTN, stroke with lleft sided weakness/numbness, DVT who was admitted on 10/06/15 with complaints of dizziness, paraesthesias of right arm and right leg, numbness of hands and feet, BLE weakness and difficulty with walking. She underwent thermal ablation of liver metastatic lesion on 09/25/15 and has been having low grade fevers PTA. MRI brain negative for stroke.  She developed hypotension with hypotension and large amount of coffee ground emesis on 02/24 and Gi consulted for input. Dr. Paulita Fujita felt symptoms due to esophagitis/gastritis from plavix and recommended serial H/H and hydration.   She was intubated 02/26- 03/05 due to respiratory insufficiency and was treated for HCAP with Vanc/Zosyn. MRI Cervical/thoracic/lumbar spine showed severe neural foraminal stenosis R-C4, R-C5, B-C6 and L-C7 nerve roots and peripheral enhancement of distal thoracic spinal cord is well nerve roots with anterior and posterior suggestive of GBS. Due to areflexia of BLE and hypoactive UE reflexes she was treated with IVIG X 5 doses. She has had visual and tactile hallucinations felt to be due to opiates. She underwent EGD 03/1 showing question of proximal linear ulcer question from NGT and she was cleared to resume blood thinners.   BUE strength has been improving and respiratory status stable. FEES 03/6 Showed severe dysphagia and patient kept NPO with tube feeds for nutritional support. Repeat MBS done 03/10   Subjective/Complaints: Slept ok, no pain, feels tingling and numbness in BLE  Objective: Vital Signs: Blood pressure 128/63, pulse 75, temperature 98.6 F (37 C), temperature source Oral, resp. rate 17, height _0  (1.575 m), weight 58 kg (127 lb 13.9 oz), SpO2 99 %. No results found. Results for orders placed or performed during the hospital encounter of 10/27/15 (from the past 72 hour(s))  CBC WITH  DIFFERENTIAL     Status: Abnormal   Collection Time: 10/28/15  6:52 AM  Result Value Ref Range   WBC 7.0 4.0 - 10.5 K/uL   RBC 3.59 (L) 3.87 - 5.11 MIL/uL   Hemoglobin 10.7 (L) 12.0 - 15.0 g/dL   HCT 32.8 (L) 36.0 - 46.0 %   MCV 91.4 78.0 - 100.0 fL   MCH 29.8 26.0 - 34.0 pg   MCHC 32.6 30.0 - 36.0 g/dL   RDW 16.2 (H) 11.5 - 15.5 %   Platelets 405 (H) 150 - 400 K/uL   Neutrophils Relative % 78 %   Neutro Abs 5.4 1.7 - 7.7 K/uL   Lymphocytes Relative 15 %   Lymphs Abs 1.0 0.7 - 4.0 K/uL   Monocytes Relative 6 %   Monocytes Absolute 0.4 0.1 - 1.0 K/uL   Eosinophils Relative 1 %   Eosinophils Absolute 0.1 0.0 - 0.7 K/uL   Basophils Relative 0 %   Basophils Absolute 0.0 0.0 - 0.1 K/uL  Comprehensive metabolic panel     Status: Abnormal   Collection Time: 10/28/15  6:52 AM  Result Value Ref Range   Sodium 133 (L) 135 - 145 mmol/L   Potassium 4.0 3.5 - 5.1 mmol/L   Chloride 101 101 - 111 mmol/L   CO2 25 22 - 32 mmol/L   Glucose, Bld 94 65 - 99 mg/dL   BUN 13 6 - 20 mg/dL   Creatinine, Ser 0.47 0.44 - 1.00 mg/dL   Calcium 8.4 (L) 8.9 - 10.3 mg/dL   Total Protein 6.1 (L) 6.5 - 8.1 g/dL   Albumin 2.0 (L) 3.5 - 5.0 g/dL   AST 23 15 - 41  U/L   ALT 20 14 - 54 U/L   Alkaline Phosphatase 42 38 - 126 U/L   Total Bilirubin 0.9 0.3 - 1.2 mg/dL   GFR calc non Af Amer >60 >60 mL/min   GFR calc Af Amer >60 >60 mL/min    Comment: (NOTE) The eGFR has been calculated using the CKD EPI equation. This calculation has not been validated in all clinical situations. eGFR's persistently <60 mL/min signify possible Chronic Kidney Disease.    Anion gap 7 5 - 15     HEENT: normal Cardio: RRR Resp: CTA B/L GI: BS positive and NT, ND Extremity:  No Edema Skin:   Intact Neuro: Alert/Oriented, Abnormal Sensory absent LT and Proprio in BLE, and Abnormal Motor 3+ LUE, 3/5 RUE, 2- B hip ext o/w 0 in BLE Musc/Skel:  Other no pain with UE or LE ROM Gen Fatigued but NAD   Assessment/Plan: 1.  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 Physiatrist is providing close team supervision and 24 hour management of active medical problems listed below. Physiatrist and rehab team continue to assess barriers to discharge/monitor patient progress toward functional and medical goals. FIM:                   Function - Comprehension Comprehension: Auditory Comprehension assist level: Understands complex 90% of the time/cues 10% of the time  Function - Expression Expression: Verbal Expression assist level: Expresses complex 90% of the time/cues < 10% of the time  Function - Social Interaction Social Interaction assist level: Interacts appropriately 90% of the time - Needs monitoring or encouragement for participation or interaction.  Function - Problem Solving Problem solving assist level: Solves complex 90% of the time/cues < 10% of the time  Function - Memory Memory assist level: Recognizes or recalls 90% of the time/requires cueing < 10% of the time Patient normally able to recall (first 3 days only): Current season  Medical Problem List and Plan: 1.  Quadriplegia, numbness, gait instability secondary to GBS. 2.  DVT Prophylaxis/Anticoagulation: Pharmaceutical: Lovenox 3. Pain Management: Tylenol prn. Limit narcotics.   4. Mood: LCSW to follow for evaluation and support.   5. Neuropsych: This patient is not fully capable of making decisions on her own behalf. 6. Skin/Wound Care: Routine pressure relief measures.   7. Fluids/Electrolytes/Nutrition: Monitor I/O. Check lytes in am. Offer supplements between meals.   8.  Citrobacter [place] UTI: On Ceftriaxone D#7/7. Discontinue foley and start bladder training. Avoid prolonged antibiotics due to history of C diff.   9. Colon cancer with liver mets: No complaints of pain.    10. UGIB with ABLA: H/H stable and  anemia resolving. Continue to monitor for any recurrent signs of bleeding. Am Hgb 10.7, stable   LOS (Days) 1 A FACE TO FACE EVALUATION WAS PERFORMED  Cynthia [name] E 10/28/2015, 8:33 AM

## 2015-10-28 NOTE — Progress Notes (Signed)
Patient information reviewed and entered into eRehab system by Miko Markwood, RN, CRRN, PPS Coordinator.  Information including medical coding and functional independence measure will be reviewed and updated through discharge.     Per nursing patient was given "Data Collection Information Summary for Patients in Inpatient Rehabilitation Facilities with attached "Privacy Act Statement-Health Care Records" upon admission.  

## 2015-10-28 NOTE — Evaluation (Signed)
Occupational Therapy Assessment and Plan  Patient Details  Name: Cynthia [name] MRN: [id number] Date of Birth: [birth year]  OT Diagnosis: abnormal posture, acute pain, cognitive deficits, muscle weakness (generalized) and impaired sensation, coordination deficit, quadriplegia Rehab Potential: Rehab Potential (ACUTE ONLY): Fair ELOS: 24-27 days   Today's Date: 10/28/2015 OT Individual Time: 4098-1191 OT Individual Time Calculation (min): 55 min     Problem List:  Patient Active Problem List   Diagnosis Date Noted  . Quadriplegia and quadriparesis ([place])   . Numbness and tingling   . Paresthesias   . UTI (lower urinary tract infection)   . History of colon cancer   . Acute blood loss anemia   . Upper GI bleed   . GBS (Guillain-Barre syndrome) ([place])   . Aspiration into airway   . Endotracheally intubated   . Acute respiratory failure ([place])   . Shock ([place]) 10/09/2015  . Hemoptysis   . Transverse myelitis ([place])   . Peripheral neuropathy ([place]) 10/06/2015  . Paresthesia of both hands 10/06/2015  . Paresthesia of both feet 10/06/2015  . Cerebral embolism with cerebral infarction 10/06/2015  . Ataxia   . Colon carcinoma metastatic to liver ([place]) 09/25/2015  . Metastasis to liver ([place]) 08/28/2015  . Colon cancer metastasized to liver ([place])   . Liver lesion   . Cerebral infarction due to unspecified mechanism 01/29/2015  . B12 deficiency 01/29/2015  . Essential hypertension 01/29/2015  . Arm numbness left   . Disorientation   . Numbness and tingling of left side of face   . TIA (transient ischemic attack) 01/11/2015  . Confusion   . Left arm numbness   . DVT, lower extremity, distal ([place]) 10/27/2013  . T4a, N0 09/27/2013  . Iron deficiency anemia 09/04/2013  . Symptomatic anemia 09/03/2013  . Orthostasis 09/03/2013  . Near syncope 09/03/2013    Past Medical History:  Past Medical History  Diagnosis Date  . Anemia   . Blood transfusion without reported diagnosis jan  2015  . Heart murmur   . Hypertension     no bp meds   . Liver cyst   . C. difficile colitis 03/05/14, 03/22/14, 04/05/14    recurrent  . Stroke ([place]) 12/2014    NUMBNESS ON LEFT SIDE  . Cervical cancer ([place]) 1972  . Colon cancer ([place]) JAN 2015  . Colon cancer ([place])   . Cancer ([place])     liver  . Complication of anesthesia     slow to awaken in past, DONE WELL RECENTLY  . Tinnitus of both ears ALL THE TIME  . DVT (deep venous thrombosis) ([place]) 10/24/13    Right leg   Past Surgical History:  Past Surgical History  Procedure Laterality Date  . Back surgery  1981    lower lumbar  . Tubal ligation  1968  . Laparoscopic right colectomy Right 10/22/2013    Procedure: LAPAROSCOPIC RIGHT COLECTOMY ;  Surgeon: [name], MD;  Location: [facility];  Service: General;  Laterality: Right;  . Salpingoophorectomy  10/22/2013    Procedure: Marquette Saa;  Surgeon: [name]. [name], MD;  Location: [facility];  Service: Gynecology;;  . Abdominal hysterectomy      vaginal- partial  . Esophagogastroduodenoscopy N/A 10/14/2015    Procedure: ESOPHAGOGASTRODUODENOSCOPY (EGD);  Surgeon: Clarene Essex, MD;  Location: [facility] ENDOSCOPY;  Service: Endoscopy;  Laterality: N/A;  Bedside in ICU    Assessment & Plan Clinical Impression: Patient is a 80 y.o. year old female with metastatic colon  cancer s/p surgery and chemotherapy, recurrent C-diff, HTN, stroke with lleft sided weakness/numbness, DVT who was admitted on 10/06/15 with complaints of dizziness, paraesthesias of right arm and right leg, numbness of hands and feet, BLE weakness and difficulty with walking. She underwent thermal ablation of liver metastatic lesion on 09/25/15 and has been having low grade fevers PTA. MRI brain negative for stroke.  She developed hypotension with hypotension and large amount of coffee ground emesis on 02/24 and Gi consulted for input. Dr. Paulita Fujita felt symptoms due to esophagitis/gastritis from plavix and recommended  serial H/H and hydration.   She was intubated 02/26- 03/05 due to respiratory insufficiency and was treated for HCAP with Vanc/Zosyn. MRI Cervical/thoracic/lumbar spine showed severe neural foraminal stenosis R-C4, R-C5, B-C6 and L-C7 nerve roots and peripheral enhancement of distal thoracic spinal cord is well nerve roots with anterior and posterior suggestive of GBS. Due to areflexia of BLE and hypoactive UE reflexes she was treated with IVIG X 5 doses. She has had visual and tactile hallucinations felt to be due to opiates. She underwent EGD 03/1 showing question of proximal linear ulcer question from NGT and she was cleared to resume blood thinners.   BUE strength has been improving and respiratory status stable. FEES 03/6 Showed severe dysphagia and patient kept NPO with tube feeds for nutritional support. Repeat MBS done 03/10 and she was started on  She continues to have intermittent hallucinations/confusion question due to Citrobacter UTI and was started on Ceftriaxone 3/9. Had MS changes with hypoxia and lethargy therefore EEG done 03/11 showing mild generalized slowing likely due to metabolic or toxic encephalopathy v/s degenerative central nervous disorders. Psychiatry consulted for input and medications adjusted. She continues to be limited by motor weakness, ongoing issues with insomnia and confusion, poor po intake as well as pain and fatigue. CIR recommended for follow up therapy and patient admitted today. Patient transferred to CIR on 10/27/2015 .    Patient currently requires total with basic self-care skills secondary to muscle weakness, decreased cardiorespiratoy endurance, decreased coordination and decreased motor planning, decreased safety awareness and decreased sitting balance, decreased standing balance, decreased postural control and decreased balance strategies.  Prior to hospitalization, patient could complete ADls with modified independent .  Patient will benefit from  skilled intervention to decrease level of assist with basic self-care skills prior to discharge home with care partner.  Anticipate patient will require moderate physical assestance and follow up home health.  OT - End of Session Activity Tolerance: Decreased this session Endurance Deficit: Yes Endurance Deficit Description: fatigued easily during sessoin and required multiple rest breaks OT Assessment Rehab Potential (ACUTE ONLY): Fair Barriers to Discharge Comments: none known at this time OT Patient demonstrates impairments in the following area(s): Balance;Cognition;Endurance;Motor;Pain;Perception;Safety;Sensory;Skin Integrity OT Basic ADL's Functional Problem(s): Eating;Grooming;Bathing;Dressing;Toileting OT Transfers Functional Problem(s): Toilet;Tub/Shower OT Plan OT Intensity: Minimum of 1-2 x/day, 45 to 90 minutes OT Frequency: 5 out of 7 days OT Duration/Estimated Length of Stay: 24-27 days OT Treatment/Interventions: Balance/vestibular training;Cognitive remediation/compensation;Discharge planning;Community reintegration;Pain management;UE/LE Coordination activities;Skin care/wound managment;Neuromuscular re-education;Functional mobility training;Self Care/advanced ADL retraining;UE/LE Strength taining/ROM;Therapeutic Exercise;Psychosocial support;DME/adaptive equipment instruction;Patient/family education;Therapeutic Activities;Wheelchair propulsion/positioning OT Self Feeding Anticipated Outcome(s): set up A OT Basic Self-Care Anticipated Outcome(s): set up A - mod A OT Toileting Anticipated Outcome(s): mod A OT Bathroom Transfers Anticipated Outcome(s): mod A OT Recommendation Recommendations for Other Services: Neuropsych consult Patient destination: Home Follow Up Recommendations: Home health OT Equipment Recommended: To be determined   Skilled Therapeutic Intervention Upon entering the room, pt  supine in bed with no c/o pain but pt does grimace when rolling to L <> R. OT  educated pt on OT purpose, POC, and goals with pt verbalizing understanding and in agreement. Pt engaged in bathing from bed level with pt able to wash face with set up as well as chest. Pt fatigued quickly and required hand over hand assistance for UB tasks. Pt rolled L <> R with total A from therapist to don/doff adult brief. Pt unable to "hook" UE on bedrail in order to assist with task. After dressed, OT set up food tray and put food on fork and placed in pt's hand. She was then able to bring from hand to mouth. Pt utilizing much energy to chew food. OT suggested 6 small meals to pt and RN for energy conservation. Pt exhausted at end of session and unable to keep eyes open. Bed alarm activated with soft call bell placed within reach upon exiting the room.   OT Evaluation Precautions/Restrictions  Precautions Precautions: Fall Restrictions Weight Bearing Restrictions: No Pain Pain Assessment Pain Assessment: Faces Faces Pain Scale: Hurts a little bit Pain Type: Acute pain Pain Location: Leg Pain Orientation: Right;Left Pain Descriptors / Indicators: Sore;Guarding Pain Onset: With Activity Patients Stated Pain Goal: 0 Pain Intervention(s): Repositioned;Rest Multiple Pain Sites: No Home Living/Prior Functioning Home Living Available Help at Discharge: Family, Available 24 hours/day Type of Home: House Home Access: Stairs to enter [employer] of Steps: 1 Entrance Stairs-Rails: None Home Layout: One level Bathroom Shower/Tub: Associate Professor: Yes  Lives With: Daughter Prior Function Level of Independence: Independent with basic ADLs, Independent with transfers, Independent with gait Vision/Perception  Vision- History Baseline Vision/History: Wears glasses Wears Glasses: At all times Patient Visual Report: No change from baseline Vision- Assessment Vision Assessment?: No apparent visual deficits  Cognition Overall  Cognitive Status: Impaired/Different from baseline Arousal/Alertness: Lethargic Orientation Level: Person;Place;Situation Person: Oriented Place: Oriented Situation: Oriented Year: 2017 Month: March Day of Week: Correct Memory: Impaired Memory Impairment: Decreased short term memory;Decreased recall of new information Decreased Short Term Memory: Verbal basic;Functional basic Immediate Memory Recall: Sock;Blue;Bed Memory Recall: Sock (unable to state others) Memory Recall Sock: Without Cue Attention: Focused;Sustained Focused Attention: Appears intact Sustained Attention: Impaired Sustained Attention Impairment: Verbal basic;Functional basic Awareness: Impaired Awareness Impairment: Emergent impairment Problem Solving: Impaired Problem Solving Impairment: Verbal basic;Functional basic Executive Function: Initiating Initiating: Impaired Initiating Impairment: Functional basic Behaviors: Restless;Poor frustration tolerance;Perseveration Safety/Judgment: Impaired Sensation Sensation Light Touch: Impaired by gross assessment Stereognosis: Impaired by gross assessment Hot/Cold: Impaired by gross assessment Proprioception: Impaired by gross assessment Additional Comments: Decreased sensation/proprioception R/L lower extremity, absent sensation/proprioception B feet  Coordination Gross Motor Movements are Fluid and Coordinated: No Fine Motor Movements are Fluid and Coordinated: No Motor  Motor Motor: Abnormal postural alignment and control;Abnormal tone;Tetraplegia Mobility  Bed Mobility Bed Mobility: Rolling Right;Rolling Left Rolling Right: 1: +1 Total assist Rolling Left: 1: +1 Total assist   Balance Balance Balance Assessed: Yes Static Sitting Balance Static Sitting - Balance Support: Bilateral upper extremity supported Static Sitting - Level of Assistance: 1: +2 Total assist Static Sitting - Comment/# of Minutes: < 1 minute, could not tolerate sitting EOB with BLE  in dependent position/pain with all movement of BLE Extremity/Trunk Assessment RUE Assessment RUE Assessment: Exceptions to [facility] (2+/5 throughout) LUE Assessment LUE Assessment: Exceptions to [facility] (2+/5 throughout)   See Function Navigator for Current Functional Status.   Refer to Care Plan for Long Term Goals  Recommendations  for other services: Neuropsych  Discharge Criteria: Patient will be discharged from OT if patient refuses treatment 3 consecutive times without medical reason, if treatment goals not met, if there is a change in medical status, if patient makes no progress towards goals or if patient is discharged from hospital.  The above assessment, treatment plan, treatment alternatives and goals were discussed and mutually agreed upon: by patient  Phineas Semen 10/28/2015, 12:47 PM

## 2015-10-28 NOTE — Evaluation (Signed)
Speech Language Pathology Cognitive-linguistic evaluation and BSE  Patient Details  Name: Cynthia [name] MRN: [id number] Date of Birth: [birth year]  SLP Diagnosis: Dysphagia;Cognitive Impairments  Rehab Potential: Good ELOS: 24-27 days     Today's Date: 10/28/2015  Session 1: SLP Individual Time: 1030-1055 SLP Individual Time Calculation (min): 25 min   Session 2: SLP Individual Time: 7017-7939 SLP Individual Time Calculation (min): 35 min    Problem List:  Patient Active Problem List   Diagnosis Date Noted  . Quadriplegia and quadriparesis ([place])   . Numbness and tingling   . Paresthesias   . UTI (lower urinary tract infection)   . History of colon cancer   . Acute blood loss anemia   . Upper GI bleed   . GBS (Guillain-Barre syndrome) ([place])   . Aspiration into airway   . Endotracheally intubated   . Acute respiratory failure ([place])   . Shock ([place]) 10/09/2015  . Hemoptysis   . Transverse myelitis ([place])   . Peripheral neuropathy ([place]) 10/06/2015  . Paresthesia of both hands 10/06/2015  . Paresthesia of both feet 10/06/2015  . Cerebral embolism with cerebral infarction 10/06/2015  . Ataxia   . Colon carcinoma metastatic to liver ([place]) 09/25/2015  . Metastasis to liver ([place]) 08/28/2015  . Colon cancer metastasized to liver ([place])   . Liver lesion   . Cerebral infarction due to unspecified mechanism 01/29/2015  . B12 deficiency 01/29/2015  . Essential hypertension 01/29/2015  . Arm numbness left   . Disorientation   . Numbness and tingling of left side of face   . TIA (transient ischemic attack) 01/11/2015  . Confusion   . Left arm numbness   . DVT, lower extremity, distal ([place]) 10/27/2013  . T4a, N0 09/27/2013  . Iron deficiency anemia 09/04/2013  . Symptomatic anemia 09/03/2013  . Orthostasis 09/03/2013  . Near syncope 09/03/2013   Past Medical History:  Past Medical History  Diagnosis Date  . Anemia   . Blood transfusion without reported diagnosis jan  2015  . Heart murmur   . Hypertension     no bp meds   . Liver cyst   . C. difficile colitis 03/05/14, 03/22/14, 04/05/14    recurrent  . Stroke ([place]) 12/2014    NUMBNESS ON LEFT SIDE  . Cervical cancer ([place]) 1972  . Colon cancer ([place]) JAN 2015  . Colon cancer ([place])   . Cancer ([place])     liver  . Complication of anesthesia     slow to awaken in past, DONE WELL RECENTLY  . Tinnitus of both ears ALL THE TIME  . DVT (deep venous thrombosis) ([place]) 10/24/13    Right leg   Past Surgical History:  Past Surgical History  Procedure Laterality Date  . Back surgery  1981    lower lumbar  . Tubal ligation  1968  . Laparoscopic right colectomy Right 10/22/2013    Procedure: LAPAROSCOPIC RIGHT COLECTOMY ;  Surgeon: [name], MD;  Location: [facility];  Service: General;  Laterality: Right;  . Salpingoophorectomy  10/22/2013    Procedure: Marquette Saa;  Surgeon: [name]. [name], MD;  Location: [facility];  Service: Gynecology;;  . Abdominal hysterectomy      vaginal- partial  . Esophagogastroduodenoscopy N/A 10/14/2015    Procedure: ESOPHAGOGASTRODUODENOSCOPY (EGD);  Surgeon: Clarene Essex, MD;  Location: [facility] ENDOSCOPY;  Service: Endoscopy;  Laterality: N/A;  Bedside in ICU    Assessment / Plan / Recommendation Clinical Impression Patient is a 80 y.o.  female with metastatic colon cancer s/p surgery and chemotherapy, recurrent C-diff, HTN, stroke with left sided weakness/numbness, DVT who was admitted on 10/06/15 with complaints of dizziness, paraesthesias of right arm and right leg, numbness of hands and feet, BLE weakness and difficulty with walking.  She underwent thermal ablation of liver metastatic lesion on 09/25/15 and has been having low grade fevers PTA.  MRI brain negative for stroke. She developed hypotension and large amount of coffee ground emesis on 02/24 and GI consulted for input. Dr. Paulita Fujita felt symptoms due to esophagitis/gastritis from plavix and recommended serial H/H and  hydration. She was intubated 02/26- 03/05 due to respiratory insufficiency and was treated for HCAP with Vanc/Zosyn.  MRI cervical/thoracic/lumbar spine showed severe neural foraminal stenosis R-C4, R-C5, B-C6 and L-C7 nerve roots and peripheral enhancement of distal thoracic spinal cord is well nerve roots with anterior and posterior suggestive of GBS. Due to areflexia of BLE and hypoactive UE reflexes she was treated with IVIG X 5 doses.  She has had visual and tactile hallucinations felt to be due to opiates. She underwent EGD 03/1 showing question of proximal linear ulcer question from NGT and she was cleared to resume blood thinners. BUE strength has been improving and respiratory status stable. FEES 03/6 showed severe dysphagia and patient kept NPO with tube feeds for nutritional support. Repeat FEES done 03/10 and she was started on regular textures with thin liquids with use of a chin tuck. She continues to have intermittent hallucinations/confusion question due to Citrobacter UTI and was started on Ceftriaxone 3/9. Had MS changes with hypoxia and lethargy therefore EEG done 03/11 showing mild generalized slowing likely due to metabolic or toxic encephalopathy v/s degenerative central nervous disorders.  Psychiatry consulted for input and medications adjusted.  She continues to be limited by motor weakness, ongoing issues with insomnia and confusion, poor PO intake as well as pain and fatigue. CIR recommended for follow up therapy and patient admitted 10/27/15. Patient demonstrates cognitive impairments characterized by impaired sustained attention, functional problem solving, initiation, recall of new information and emergent awareness which impacts her ability to complete functional and familiar tasks safely. Patient also demonstrates overt s/s of aspiration with thin liquids with cup and straw sips with decreased coordination of chin tuck and prolonged and decreased mastication of solid textures which is  also exacerbated by fatigue. Recommend patient continue current diet of Dys. 3 textures with thin liquids with full supervision for use of swallowing strategies. Patient would benefit from skilled SLP intervention to maximize cognitive and swallowing function and overall functional independence prior to discharge.   Skilled Therapeutic Interventions          Session 1: Administered a cognitive-linguistic evaluation. Please see above for details. Patient reporting discomfort and fatigue while in bed and was unable to be placed in appropriate position for PO trials, therefore, clinician will re-attempt BSE with lunch meal.   Session 2: Administered a limited BSE. Please see above for details. Patient with limited PO intake due to dislike of food and fatigue and required overall Min A verbal cues for use of chin tuck with thin liquids. Poor timing of chin tuck with cup sips of hot coffee resulted in overt coughing.   SLP Assessment  Patient will need skilled Speech Lanaguage Pathology Services during CIR admission    Recommendations  SLP Diet Recommendations: Dysphagia 3 (Mech soft);Thin Liquid Administration via: Straw Medication Administration: Whole meds with puree Supervision: Full supervision/cueing for compensatory strategies;Trained caregiver to feed patient Compensations:  Chin tuck;Slow rate;Small sips/bites;Minimize environmental distractions Postural Changes and/or Swallow Maneuvers: Seated upright 90 degrees;Upright 30-60 min after meal Oral Care Recommendations: Oral care BID Recommendations for Other Services: Neuropsych consult Patient destination: Home Follow up Recommendations: Home Health SLP;24 hour supervision/assistance Equipment Recommended: None recommended by SLP    SLP Frequency 3 to 5 out of 7 days   SLP Duration  SLP Intensity  SLP Treatment/Interventions 24-27 days   Minumum of 1-2 x/day, 30 to 90 minutes  Cognitive remediation/compensation;Cueing  hierarchy;Dysphagia/aspiration precaution training;Functional tasks;Patient/family education;Therapeutic Activities;Speech/Language facilitation;Internal/external aids;Environmental controls;Therapeutic Exercise    Pain Pain in bottom and legs, RN made aware. Patient repositioned and pre-medicated   Function:  Eating Eating   Modified Consistency Diet: Yes Eating Assist Level: Helper feeds patient;Supervision or verbal cues           Cognition Comprehension Comprehension assist level: Follows basic conversation/direction with no assist  Expression   Expression assist level: Expresses basic 90% of the time/requires cueing < 10% of the time.  Social Interaction Social Interaction assist level: Interacts appropriately 75 - 89% of the time - Needs redirection for appropriate language or to initiate interaction.  Problem Solving Problem solving assist level: Solves basic 50 - 74% of the time/requires cueing 25 - 49% of the time  Memory Memory assist level: Recognizes or recalls 50 - 74% of the time/requires cueing 25 - 49% of the time   Short Term Goals: Week 1: SLP Short Term Goal 1 (Week 1): Patient will consume current diet with minimal overt s/s of aspiration with supervision verbal cues for use of swallowing compenstory strategies.  SLP Short Term Goal 2 (Week 1): Patient will demonstrate sustained attention to functional task for 5 minutes with Min A verbal cues for redirection.  SLP Short Term Goal 3 (Week 1): Patient will recall at least 1 event from previous therapy sessions with Min A question and verbal cues.   Refer to Care Plan for Long Term Goals  Recommendations for other services: Neuropsych  Discharge Criteria: Patient will be discharged from SLP if patient refuses treatment 3 consecutive times without medical reason, if treatment goals not met, if there is a change in medical status, if patient makes no progress towards goals or if patient is discharged from  hospital.  The above assessment, treatment plan, treatment alternatives and goals were discussed and mutually agreed upon: by patient and by family  Maxie Slovacek 10/28/2015, 5:18 PM

## 2015-10-28 NOTE — Progress Notes (Signed)
NIF -52 FVC 1.4 L  Pt tolerated well.  Good patient effort.  RT will continue to monitor.

## 2015-10-28 NOTE — Progress Notes (Signed)
Patient able to lift head from pillow without difficulty.  NIF: -29 VC: 1.4L  No respiratory complaints.  HR: 72 RR: 16 SpO2: 97%

## 2015-10-29 ENCOUNTER — Inpatient Hospital Stay (HOSPITAL_COMMUNITY): Payer: Medicare Other | Admitting: Physical Therapy

## 2015-10-29 ENCOUNTER — Inpatient Hospital Stay (HOSPITAL_COMMUNITY): Payer: Medicare Other | Admitting: Occupational Therapy

## 2015-10-29 ENCOUNTER — Inpatient Hospital Stay (HOSPITAL_COMMUNITY): Payer: Medicare Other

## 2015-10-29 ENCOUNTER — Inpatient Hospital Stay (HOSPITAL_COMMUNITY): Payer: Self-pay | Admitting: *Deleted

## 2015-10-29 ENCOUNTER — Ambulatory Visit (HOSPITAL_COMMUNITY): Payer: Self-pay

## 2015-10-29 ENCOUNTER — Inpatient Hospital Stay (HOSPITAL_COMMUNITY): Payer: Medicare Other | Admitting: Speech Pathology

## 2015-10-29 ENCOUNTER — Other Ambulatory Visit: Payer: Self-pay

## 2015-10-29 ENCOUNTER — Ambulatory Visit (HOSPITAL_COMMUNITY): Payer: Medicare Other

## 2015-10-29 ENCOUNTER — Inpatient Hospital Stay (HOSPITAL_COMMUNITY): Payer: Self-pay

## 2015-10-29 DIAGNOSIS — G8220 Paraplegia, unspecified: Secondary | ICD-10-CM

## 2015-10-29 MED ORDER — GABAPENTIN 100 MG PO CAPS
100.0000 mg | ORAL_CAPSULE | Freq: Three times a day (TID) | ORAL | Status: DC
  Administered 2015-10-29 – 2015-10-30 (×4): 100 mg via ORAL
  Filled 2015-10-29 (×4): qty 1

## 2015-10-29 MED ORDER — PRO-STAT SUGAR FREE PO LIQD
30.0000 mL | Freq: Every day | ORAL | Status: DC
  Administered 2015-10-30: 30 mL via ORAL
  Filled 2015-10-29 (×4): qty 30

## 2015-10-29 NOTE — Progress Notes (Signed)
Initial Nutrition Assessment  DOCUMENTATION CODES:   Severe malnutrition in context of acute illness/injury  INTERVENTION:  Continue Boost Plus po TID, each supplement provides 360 kcal and 14 grams of protein.   Provide 30 ml Prostat po once daily, each supplement provides 100 kcal and 15 grams of protein.   Encourage adequate PO intake.  NUTRITION DIAGNOSIS:   Malnutrition related to acute illness as evidenced by percent weight loss, moderate depletions of muscle mass.  GOAL:   Patient will meet greater than or equal to 90% of their needs  MONITOR:   PO intake, Supplement acceptance, Weight trends, Labs, I & O's, Skin  REASON FOR ASSESSMENT:   Low Braden    ASSESSMENT:   80 y.o. female with metastatic colon cancer s/p surgery and chemotherapy, recurrent C-diff, HTN, stroke with lleft sided weakness/numbness, DVT who was admitted on 10/06/15 with complaints of dizziness, paraesthesias of right arm and right leg, numbness of hands and feet, BLE weakness and difficulty with walking. She was intubated 02/26- 03/05 due to respiratory insufficiency and was treated for HCAP with Vanc/Zosyn. MRI Cervical/thoracic/lumbar spine showed severe neural foraminal stenosis R-C4, R-C5, B-C6 and L-C7 nerve roots and peripheral enhancement of distal thoracic spinal cord is well nerve roots with anterior and posterior suggestive of GBS  Meal completion has been 5-35%. Pt reports having a lack of appetite which has been ongoing over the past couple of days. She reports PTA eating well with no other difficulties. Usual body weight reported to be ~140 lbs. Per Epic weight records, pt with a 7.8% weight loss in 1 month. Pt currently has Boost Plus ordered and would like to continue with them. RD to additionally order Prostat to aid in protein needs. Pt was encouraged to eat her food at meals and to drink her supplements.   Nutrition-Focused physical exam completed. Findings are no fat depletion,  moderate muscle depletion, and mild edema.   Labs and medications reviewed.   Diet Order:  DIET DYS 3 Room service appropriate?: Yes; Fluid consistency:: Thin  Skin:  Wound (see comment) (Stage I pressure ulcer on coccyx.)  Last BM:  3/16  Height:   Ht Readings from Last 1 Encounters:  10/27/15 5\' 2"  (1.575 m)    Weight:   Wt Readings from Last 1 Encounters:  10/29/15 130 lb (58.968 kg)    Ideal Body Weight:  50 kg  BMI:  Body mass index is 23.77 kg/(m^2).  Estimated Nutritional Needs:   Kcal:  1600-1850  Protein:  80-90 grams  Fluid:  1.6 - 1.8 L/day  EDUCATION NEEDS:   No education needs identified at this time  Corrin Parker, MS, RD, LDN Pager # [phone] After hours/ weekend pager # [phone]

## 2015-10-29 NOTE — Progress Notes (Signed)
Notified Silvestre Mesi, PA with finding of DVT in LLE. No new orders at this time.

## 2015-10-29 NOTE — Progress Notes (Signed)
Occupational Therapy Session Note  Patient Details  Name: SAIMA PESCADOR MRN: [id number] Date of Birth: 10-Mar-1936  Today's Date: 10/29/2015 OT Individual Time: [id number] OT Individual Time Calculation (min): 58 min    Short Term Goals: Week 1:  OT Short Term Goal 1 (Week 1): Pt will tolerate 7 minutes of functional task with no more than 2 rest breaks in order to increase endurance for functional tasks.  OT Short Term Goal 2 (Week 1): Pt will perform UB bathing  in supported position with min A in order to increase I with self care.  OT Short Term Goal 3 (Week 1): Pt will perform toilet transfer with assistance of 1 person in order to decrease level of assist for functional transfers.  OT Short Term Goal 4 (Week 1): Pt willl tolerate sitting wheelchair for functional tasks at sink for 20 minutes or greater in order to increase activity tolerance.   Skilled Therapeutic Interventions/Progress Updates:    Pt resting in bed upon arrival.  Pt engaged in BADL retraining including dressing at bed level.  Pt is currently receiving baths from nursing staff during the evenings.  Pt donned shirt with HOB elevated and assistance to sit at 90 degrees to facilitate pulling shirt down in back.  Pt was able to thread her LUE into shirt sleeve.  Pt required tot A + 2 to don pants.  Focus on bed mobility (tot A + 2) with rolling right and left to facilitate pulling pants over hips and placement of pad.  Ted hose issued and donned to desensitize BLE and reduce pain.  Pt also engaged in combing hair and brushing teeth with [facility] assistance.  Coban wrapped on comb and foam tubing placed on toothbrush to facilitate pt's ability to grasp and hold. Focus on activity tolerance, bed mobility, BADL retraining, and safety awareness.  Therapy Documentation Precautions:  Precautions Precautions: Fall Precaution Comments: quadriparesis, move BLE very carefully due to pain Restrictions Weight Bearing Restrictions: No   Pain: Pain Assessment Pain Assessment: Faces Faces Pain Scale: Hurts whole lot Pain Type: Acute pain;Neuropathic pain Pain Location: Leg Pain Orientation: Right;Left Pain Descriptors / Indicators: Grimacing;Burning;Sharp;Shooting;Cramping Pain Onset: With Activity Pain Intervention(s): MD notified (Comment);Repositioned;Rest  See Function Navigator for Current Functional Status.   Therapy/Group: Individual Therapy  Leroy Libman 10/29/2015, 10:31 AM

## 2015-10-29 NOTE — Progress Notes (Signed)
Preliminary results by tech - Venous Duplex Lower Ext. Completed. Positive for acute deep vein thrombosis involving the left peroneal vein and gastro veins. Right leg, no evidence of deep or superficial vein thrombosis. Results given to [place]. Oda Cogan, BS, RDMS, RVT

## 2015-10-29 NOTE — Progress Notes (Signed)
10/29/2015- 1515- Respiratory care note- Pulmonary mechanics done-pt with a very good effort NIF -48 FVC 1.8l

## 2015-10-29 NOTE — Progress Notes (Signed)
80 y.o. female with metastatic colon cancer s/p surgery and chemotherapy, recurrent C-diff, HTN, stroke with lleft sided weakness/numbness, DVT who was admitted on 10/06/15 with complaints of dizziness, paraesthesias of right arm and right leg, numbness of hands and feet, BLE weakness and difficulty with walking. She underwent thermal ablation of liver metastatic lesion on 09/25/15 and has been having low grade fevers PTA. MRI brain negative for stroke.  She developed hypotension with hypotension and large amount of coffee ground emesis on 02/24 and Gi consulted for input. Dr. Paulita Fujita felt symptoms due to esophagitis/gastritis from plavix and recommended serial H/H and hydration.   She was intubated 02/26- 03/05 due to respiratory insufficiency and was treated for HCAP with Vanc/Zosyn. MRI Cervical/thoracic/lumbar spine showed severe neural foraminal stenosis R-C4, R-C5, B-C6 and L-C7 nerve roots and peripheral enhancement of distal thoracic spinal cord is well nerve roots with anterior and posterior suggestive of GBS. Due to areflexia of BLE and hypoactive UE reflexes she was treated with IVIG X 5 doses. She has had visual and tactile hallucinations felt to be due to opiates. She underwent EGD 03/1 showing question of proximal linear ulcer question from NGT and she was cleared to resume blood thinners.   BUE strength has been improving and respiratory status stable. FEES 03/6 Showed severe dysphagia and patient kept NPO with tube feeds for nutritional support. Repeat MBS done 03/10   Subjective/Complaints: Having substantial dysesthesias in legs which interfere with movement/basic transfers.   ROS: Pt denies fever, rash/itching, headache, blurred or double vision, nausea, vomiting, abdominal pain, diarrhea, chest pain, shortness of breath, palpitations, dysuria, dizziness, neck or back pain, bleeding, anxiety, or depression  Objective: Vital Signs: Blood pressure 136/65, pulse 71, temperature  97.8 F (36.6 C), temperature source Oral, resp. rate 16, height _0  (1.575 m), weight 58.968 kg (130 lb), SpO2 98 %. No results found. Results for orders placed or performed during the hospital encounter of 10/27/15 (from the past 72 hour(s))  CBC WITH DIFFERENTIAL     Status: Abnormal   Collection Time: 10/28/15  6:52 AM  Result Value Ref Range   WBC 7.0 4.0 - 10.5 K/uL   RBC 3.59 (L) 3.87 - 5.11 MIL/uL   Hemoglobin 10.7 (L) 12.0 - 15.0 g/dL   HCT 32.8 (L) 36.0 - 46.0 %   MCV 91.4 78.0 - 100.0 fL   MCH 29.8 26.0 - 34.0 pg   MCHC 32.6 30.0 - 36.0 g/dL   RDW 16.2 (H) 11.5 - 15.5 %   Platelets 405 (H) 150 - 400 K/uL   Neutrophils Relative % 78 %   Neutro Abs 5.4 1.7 - 7.7 K/uL   Lymphocytes Relative 15 %   Lymphs Abs 1.0 0.7 - 4.0 K/uL   Monocytes Relative 6 %   Monocytes Absolute 0.4 0.1 - 1.0 K/uL   Eosinophils Relative 1 %   Eosinophils Absolute 0.1 0.0 - 0.7 K/uL   Basophils Relative 0 %   Basophils Absolute 0.0 0.0 - 0.1 K/uL  Comprehensive metabolic panel     Status: Abnormal   Collection Time: 10/28/15  6:52 AM  Result Value Ref Range   Sodium 133 (L) 135 - 145 mmol/L   Potassium 4.0 3.5 - 5.1 mmol/L   Chloride 101 101 - 111 mmol/L   CO2 25 22 - 32 mmol/L   Glucose, Bld 94 65 - 99 mg/dL   BUN 13 6 - 20 mg/dL   Creatinine, Ser 0.47 0.44 - 1.00 mg/dL   Calcium 8.4 (  L) 8.9 - 10.3 mg/dL   Total Protein 6.1 (L) 6.5 - 8.1 g/dL   Albumin 2.0 (L) 3.5 - 5.0 g/dL   AST 23 15 - 41 U/L   ALT 20 14 - 54 U/L   Alkaline Phosphatase 42 38 - 126 U/L   Total Bilirubin 0.9 0.3 - 1.2 mg/dL   GFR calc non Af Amer >60 >60 mL/min   GFR calc Af Amer >60 >60 mL/min    Comment: (NOTE) The eGFR has been calculated using the CKD EPI equation. This calculation has not been validated in all clinical situations. eGFR's persistently <60 mL/min signify possible Chronic Kidney Disease.    Anion gap 7 5 - 15     HEENT: normal Cardio: RRR Resp: CTA B/L GI: BS positive and NT,  ND Extremity:  No Edema Skin:   Intact Neuro: Alert/Oriented, Abnormal Sensory absent LT and Proprio in BLE, and Abnormal Motor 3+ LUE, 3/5 RUE, 2- B hip ext KE tr-1, ADF/APF trace. Sensation decreased in stocking glove distribution in both legs. Has little sense of gross touch in either foot. Hands/UE's with similar pattern but much less affected.  Musc/Skel:  Other no pain with UE or LE ROM Gen Fatigued but NAD   Assessment/Plan: 1. 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 Physiatrist is providing close team supervision and 24 hour management of active medical problems listed below. Physiatrist and rehab team continue to assess barriers to discharge/monitor patient progress toward functional and medical goals. FIM: Function - Bathing Position: Bed Body parts bathed by patient: Chest Body parts bathed by helper: Right arm, Left arm, Abdomen, Front perineal area, Buttocks, Left upper leg, Right upper leg, Right lower leg, Left lower leg Bathing not applicable: Back Assist Level:  (total A)  Function- Upper Body Dressing/Undressing What is the patient wearing?: Hospital gown Assist Level:  (total A) Function - Lower Body Dressing/Undressing What is the patient wearing?: Hospital Gown, Non-skid slipper socks Position: Bed Non-skid slipper socks- Performed by helper: Don/doff right sock, Don/doff left sock Assist for footwear: Dependant Assist for lower body dressing:  (total A)  Function - Toileting Toileting activity did not occur: No continent bowel/bladder event  Function - Air cabin crew transfer activity did not occur: Safety/medical concerns  Function - Chair/bed transfer Chair/bed transfer activity did not occur: Safety/medical concerns (unable to tolerate sitting upright EOB )  Function - Locomotion: Wheelchair Will patient use wheelchair at  discharge?: Yes Wheelchair activity did not occur: Safety/medical concerns (unable to tolerate sitting upright EOB) Function - Locomotion: Ambulation Ambulation activity did not occur: Safety/medical concerns  Function - Comprehension Comprehension: Auditory Comprehension assist level: Follows basic conversation/direction with no assist  Function - Expression Expression: Verbal Expression assist level: Expresses basic 90% of the time/requires cueing < 10% of the time.  Function - Social Interaction Social Interaction assist level: Interacts appropriately 75 - 89% of the time - Needs redirection for appropriate language or to initiate interaction.  Function - Problem Solving Problem solving assist level: Solves basic 50 - 74% of the time/requires cueing 25 - 49% of the time  Function - Memory Memory assist level: Recognizes or recalls 50 - 74% of the time/requires cueing 25 - 49% of the time Patient normally able to recall (first 3 days only): Current season, Location of own room, Staff names and faces, That he or she is in a hospital  Medical Problem  List and Plan: 1.  Quadriplegia, numbness, gait instability secondary to GBS  -continue CIR. 2.  DVT Prophylaxis/Anticoagulation: Pharmaceutical: Lovenox 3. Pain Management: Tylenol prn. Limit narcotics due to cognitive effects     -will introduce low dose gabapentin for dysesthesias  -spoke to patient about desensitization/activity as tolerated==can wear TEDS 4. Mood: LCSW to follow for evaluation and support.   5. Neuropsych: This patient is not fully capable of making decisions on her own behalf. 6. Skin/Wound Care: Routine pressure relief measures.   7. Fluids/Electrolytes/Nutrition: Monitor I/O.   -need to watch sodium levels--recheck tomorrow  -encourage PO.   8.  Citrobacter Koseri UTI: treated with ctx  -voiding trial, I/O cath prn. 9. Colon cancer with liver mets: No complaints of pain.    10. UGIB with ABLA: H/H stable  and anemia resolving. Continue to monitor for any recurrent signs of bleeding. Am Hgb 10.7, stable   LOS (Days) 2 A FACE TO FACE EVALUATION WAS PERFORMED  Layci Stenglein T 10/29/2015, 9:21 AM

## 2015-10-29 NOTE — Progress Notes (Signed)
Occupational Therapy Note  Patient Details  Name: LAKELYNN RASOR MRN: [id number] Date of Birth: [birth year]  Today's Date: 10/29/2015 OT Individual Time: 1300-1330 OT Individual Time Calculation (min): 30 min   Pt denied pain Individual therapy  Pt resting in bed with NT assisting with meal.  Pt continued with eating lunch with OT providing HOH assistance for self feeding.  Pt issued foam tubing to place on utensils to facilitate patient's ability to grasp utensils.  Pt issued cup with lid and handle to facilitate pt's independence with drinking from cup.  Pt provided HOH assistance to use cup.  Pt also engaged in grooming tasks; washing hands.  Pt exhibits decrease FMC/coordination with [facility] and gross motor tasks.    [name] [facility] 10/29/2015, 2:25 PM

## 2015-10-29 NOTE — Progress Notes (Signed)
Speech Language Pathology Daily Session Note  Patient Details  Name: Cynthia [name] MRN: [id number] Date of Birth: 1936/07/29  Today's Date: 10/29/2015 SLP Individual Time: 1345-1410 SLP Individual Time Calculation (min): 25 min  Short Term Goals: Week 1: SLP Short Term Goal 1 (Week 1): Patient will consume current diet with minimal overt s/s of aspiration with supervision verbal cues for use of swallowing compenstory strategies.  SLP Short Term Goal 2 (Week 1): Patient will demonstrate sustained attention to functional task for 5 minutes with Min A verbal cues for redirection.  SLP Short Term Goal 2 - Progress (Week 1): Met SLP Short Term Goal 3 (Week 1): Patient will recall at least 1 event from previous therapy sessions with Min A question and verbal cues.  SLP Short Term Goal 3 - Progress (Week 1): Met SLP Short Term Goal 4 (Week 1): Patient will demonstrate efficient mastication with complete oral clearance with a self-percieved effort level of 5 or less in regards to fatigue with regular textures with supervision verbal cues  over 2 consecutive sesions prior to upgrade.   Skilled Therapeutic Interventions: Skilled treatment session focused on cognitive goals. Upon arrival, patient was awake while sitting upright in bed and appeared brighter and more engaged in therapy session due to decreased pain. SLP facilitated session by administering the MoCA-Blind due to UE weakness with inability to complete visuospatial tasks on full MoCA assessment. Patient scored 22/22 points without any evidence of cognitive deficits, therefore, all cognitive goals were discharged at this time. However, patient will continue to be followed by SLP for swallowing goals. Patient and family both verbalized understanding and agreement. Patient left upright in bed with all needs within reach. Continue with current plan of care.    Function:  Cognition Comprehension Comprehension assist level: Follows basic  conversation/direction with no assist  Expression   Expression assist level: Expresses basic 90% of the time/requires cueing < 10% of the time.  Social Interaction Social Interaction assist level: Interacts appropriately with others with medication or extra time (anti-anxiety, antidepressant).  Problem Solving Problem solving assist level: Solves complex problems: With extra time  Memory Memory assist level: Recognizes or recalls 90% of the time/requires cueing < 10% of the time    Pain No reports of pain throughout session  Therapy/Group: Individual Therapy  Kailin Leu 10/29/2015, 4:41 PM

## 2015-10-29 NOTE — Progress Notes (Signed)
Physical Therapy Session Note  Patient Details  Name: Cynthia [name] MRN: [id number] Date of Birth: [birth year]  Today's Date: 10/29/2015 PT Individual Time: 0800-0840 PT Individual Time Calculation (min): 40 min   Short Term Goals: Week 1:  PT Short Term Goal 1 (Week 1): Patient will maintain sitting balance with max A x 3 minutes.  PT Short Term Goal 2 (Week 1): Patient will initiate transfers bed <> wheelchair.  PT Short Term Goal 3 (Week 1): Patient will tolerate OOB x 30 minutes.  PT Short Term Goal 4 (Week 1): Patient will perform rolling with hospital bed functions with max A.   Skilled Therapeutic Interventions/Progress Updates:   Patient semi reclined in bed, appearing brighter and with stronger vocal intensity this date. Patient wearing PRAFOs but BLE internally rotated due to kickstand on lateral sides, educated patient on positioning of kickstands and sign posted above bed to notify RN staff to place kickstands on medial side due to patient posturing in bed. MD present and notified of paresthesias/pain in LLE > RLE, reported he will look at medications and gave verbal order for TEDs in effort to decrease hypersensitivity. Performed PROM B heel cords, AAROM hip/knee flexion and resisted hip/knee extension x 5 each LE before patient required rest break. Patient agreeable to try sitting edge of bed with encouragement. With nurse tech present to assist with keeping BLE in extension to prevent dependent position, transferred supine > R sidelying > sit with total A x 2, patient shouting out in pain and requesting to return to bed. Patient repositioned in L sidelying for pressure relief and left with soft call bell in reach, obtained TED hose and next therapist notified to trial in session.   Therapy Documentation Precautions:  Precautions Precautions: Fall Precaution Comments: quadriparesis, move BLE very carefully due to pain Restrictions Weight Bearing Restrictions:  No Pain: Pain Assessment Pain Assessment: Faces Faces Pain Scale: Hurts whole lot Pain Type: Acute pain;Neuropathic pain Pain Location: Leg Pain Orientation: Right;Left Pain Descriptors / Indicators: Grimacing;Burning;Sharp;Shooting;Cramping Pain Onset: With Activity Pain Intervention(s): MD notified (Comment);Repositioned;Rest   See Function Navigator for Current Functional Status.   Therapy/Group: Individual Therapy  Laretta Alstrom 10/29/2015, 8:43 AM

## 2015-10-29 NOTE — Care Management Note (Signed)
[place] Individual Statement of Services  Patient Name:  Cynthia [name]  Date:  10/29/2015  Welcome to the [facility].  Our goal is to provide you with an individualized program based on your diagnosis and situation, designed to meet your specific needs.  With this comprehensive rehabilitation program, you will be expected to participate in at least 3 hours of rehabilitation therapies Monday-Friday, with modified therapy programming on the weekends.  Your rehabilitation program will include the following services:  Physical Therapy (PT), Occupational Therapy (OT), Speech Therapy (ST), 24 hour per day rehabilitation nursing, Therapeutic Recreaction (TR), Neuropsychology, Case Management (Social Worker), Rehabilitation Medicine, Nutrition Services and Pharmacy Services  Weekly team conferences will be held on Tuesdays to discuss your progress.  Your Social Worker will talk with you frequently to get your input and to update you on team discussions.  Team conferences with you and your family in attendance may also be held.  Expected length of stay: 24-28 days  Overall anticipated outcome: moderate assistance @ wheelchair  Depending on your progress and recovery, your program may change. Your Social Worker will coordinate services and will keep you informed of any changes. Your Social Worker's name and contact numbers are listed  below.  The following services may also be recommended but are not provided by the [place] will be made to provide these services after discharge if needed.  Arrangements include referral to agencies that provide these services.  Your insurance has been verified to be:  [facility] Medicare Your primary doctor is:  Jonathon Jordan  Pertinent information will be shared with your doctor and  your insurance company.  Social Worker:  [place], [place] or [phone]   Information discussed with and copy given to patient by: Lennart Pall, 10/29/2015, 10:59 AM

## 2015-10-29 NOTE — Progress Notes (Signed)
Occupational Therapy Session Note  Patient Details  Name: Cynthia [name] MRN: [id number] Date of Birth: 12-Apr-1936  Today's Date: 10/29/2015 OT Individual Time: [id number] OT Individual Time Calculation (min): 32 min    Short Term Goals: Week 1:  OT Short Term Goal 1 (Week 1): Pt will tolerate 7 minutes of functional task with no more than 2 rest breaks in order to increase endurance for functional tasks.  OT Short Term Goal 2 (Week 1): Pt will perform UB bathing  in supported position with min A in order to increase I with self care.  OT Short Term Goal 3 (Week 1): Pt will perform toilet transfer with assistance of 1 person in order to decrease level of assist for functional transfers.  OT Short Term Goal 4 (Week 1): Pt willl tolerate sitting wheelchair for functional tasks at sink for 20 minutes or greater in order to increase activity tolerance.   Skilled Therapeutic Interventions/Progress Updates:    Pt pleasant and cooperative during session.  Slightly anxious and nervous about her pain but therapist helped re-assure her that we were going to take it one step at a time.  Pt agreed to try sitting EOB during session.  Began with bilateral knee flexion with max assist.  Pt with no report of pain with this.  Worked on rolling to the right side with max assist to complete.  Pt only reporting slight pain in the weightbearing side on the RLE.  Attempted transition to sitting by bringing LEs off edge of bed and bringing trunk up.  After reaching only 75% of transition from sidelying to sit pt voiced extreme pain with therapist and tech repositioning her back into sidelying and then supine.  After rest break and re-assurance attempted supine to sit again with rolling to the left side this time.  She was able to roll with max assist again and little report of pain.  This time had therapy tech assist with bringing LEs off of bed slowly and lowering them until they reached their maximum ROM in this  position.  Pt reporting numbness and tingling in her legs but not pain.  After approximately 30 seconds therapist assist pt into sitting position from sidelying with max assist.  She did not report significant pain with this either and remained sitting on EOB for 10 mins.  Max assist needed to maintain static sitting with BUEs in supported position beside of her.  Increased LOB posteriorly and to the left.  Transitioned back to supine with +2 total assist and pt positioned with pillows under her arms and bilateral PRAFO still on her LEs.  Pt excited to be able to tolerate sitting for the first time on rehab.  Soft touch call button and call light in her lap.    Therapy Documentation Precautions:  Precautions Precautions: Fall Precaution Comments: quadriparesis, move BLE very carefully due to pain Restrictions Weight Bearing Restrictions: No  Pain: Pain Assessment Pain Assessment: 0-10 Pain Score: 10-Worst pain ever Pain Type: Neuropathic pain Pain Location: Leg Pain Orientation: Right;Left Pain Descriptors / Indicators: Burning;Crushing;Grimacing Pain Onset: With Activity Pain Intervention(s): Repositioned;Emotional support ADL: See Function Navigator for Current Functional Status.   Therapy/Group: Individual Therapy  Cynthia [name] OTR/L 10/29/2015, 4:19 PM

## 2015-10-30 ENCOUNTER — Inpatient Hospital Stay (HOSPITAL_COMMUNITY): Payer: Self-pay | Admitting: *Deleted

## 2015-10-30 ENCOUNTER — Inpatient Hospital Stay (HOSPITAL_COMMUNITY): Payer: Self-pay

## 2015-10-30 ENCOUNTER — Ambulatory Visit (HOSPITAL_COMMUNITY): Payer: Self-pay | Admitting: Speech Pathology

## 2015-10-30 LAB — BASIC METABOLIC PANEL
ANION GAP: 7 (ref 5–15)
BUN: 13 mg/dL (ref 6–20)
CHLORIDE: 101 mmol/L (ref 101–111)
CO2: 27 mmol/L (ref 22–32)
Calcium: 8.7 mg/dL — ABNORMAL LOW (ref 8.9–10.3)
Creatinine, Ser: 0.55 mg/dL (ref 0.44–1.00)
GFR calc Af Amer: >60 mL/min (ref >60)
GFR calc non Af Amer: >60 mL/min (ref >60)
GLUCOSE: 100 mg/dL — AB (ref 65–99)
POTASSIUM: 4.3 mmol/L (ref 3.5–5.1)
Sodium: 135 mmol/L (ref 135–145)

## 2015-10-30 MED ORDER — GABAPENTIN 100 MG PO CAPS
200.0000 mg | ORAL_CAPSULE | Freq: Every day | ORAL | Status: DC
  Administered 2015-10-30: 200 mg via ORAL
  Filled 2015-10-30: qty 2

## 2015-10-30 MED ORDER — GABAPENTIN 100 MG PO CAPS
100.0000 mg | ORAL_CAPSULE | Freq: Two times a day (BID) | ORAL | Status: DC
  Administered 2015-10-30 – 2015-10-31 (×2): 100 mg via ORAL
  Filled 2015-10-30 (×2): qty 1

## 2015-10-30 NOTE — Progress Notes (Signed)
Social Work  Social Work Assessment and Plan  Patient Details  Name: Cynthia [name] MRN: [id number] Date of Birth: 09/08/1935  Today's Date: 10/30/2015  Problem List:  Patient Active Problem List   Diagnosis Date Noted  . Quadriplegia and quadriparesis ([place])   . Numbness and tingling   . Paresthesias   . UTI (lower urinary tract infection)   . History of colon cancer   . Acute blood loss anemia   . Upper GI bleed   . GBS (Guillain-Barre syndrome) ([place])   . Aspiration into airway   . Endotracheally intubated   . Acute respiratory failure ([place])   . Shock ([place]) 10/09/2015  . Hemoptysis   . Transverse myelitis ([place])   . Peripheral neuropathy ([place]) 10/06/2015  . Paresthesia of both hands 10/06/2015  . Paresthesia of both feet 10/06/2015  . Cerebral embolism with cerebral infarction 10/06/2015  . Ataxia   . Colon carcinoma metastatic to liver ([place]) 09/25/2015  . Metastasis to liver ([place]) 08/28/2015  . Colon cancer metastasized to liver ([place])   . Liver lesion   . Cerebral infarction due to unspecified mechanism 01/29/2015  . B12 deficiency 01/29/2015  . Essential hypertension 01/29/2015  . Arm numbness left   . Disorientation   . Numbness and tingling of left side of face   . TIA (transient ischemic attack) 01/11/2015  . Confusion   . Left arm numbness   . DVT, lower extremity, distal ([place]) 10/27/2013  . T4a, N0 09/27/2013  . Iron deficiency anemia 09/04/2013  . Symptomatic anemia 09/03/2013  . Orthostasis 09/03/2013  . Near syncope 09/03/2013   Past Medical History:  Past Medical History  Diagnosis Date  . Anemia   . Blood transfusion without reported diagnosis jan 2015  . Heart murmur   . Hypertension     no bp meds   . Liver cyst   . C. difficile colitis 03/05/14, 03/22/14, 04/05/14    recurrent  . Stroke ([place]) 12/2014    NUMBNESS ON LEFT SIDE  . Cervical cancer ([place]) 1972  . Colon cancer ([place]) JAN 2015  . Colon cancer ()   . Cancer ([place])     liver   . Complication of anesthesia     slow to awaken in past, DONE WELL RECENTLY  . Tinnitus of both ears ALL THE TIME  . DVT (deep venous thrombosis) ([place]) 10/24/13    Right leg   Past Surgical History:  Past Surgical History  Procedure Laterality Date  . Back surgery  1981    lower lumbar  . Tubal ligation  1968  . Laparoscopic right colectomy Right 10/22/2013    Procedure: LAPAROSCOPIC RIGHT COLECTOMY ;  Surgeon: [name], MD;  Location: [facility] ORS;  Service: General;  Laterality: Right;  . Salpingoophorectomy  10/22/2013    Procedure: [place] Saa;  Surgeon: [name]. [name], MD;  Location: [facility] ORS;  Service: Gynecology;;  . Abdominal hysterectomy      vaginal- partial  . Esophagogastroduodenoscopy N/A 10/14/2015    Procedure: ESOPHAGOGASTRODUODENOSCOPY (EGD);  Surgeon: Clarene Essex, MD;  Location[facility] ENDOSCOPY;  Service: Endoscopy;  Laterality: N/A;  Bedside in ICU   Social History:  reports that she has never smoked. She has never used smokeless tobacco. She reports that she does not drink alcohol or use illicit drugs.  Family / Support Systems Marital Status: Widow/Widower How Long?: 20 yrs Patient Roles: Parent Children: daughter, Cynthia [name] @ [phone] or ([phone];  son, Cynthia [name] @ [phone]  and daughter, Cynthia [name] Anticipated Caregiver: daughter, Cynthia [name] to be primary caregiver with assistance from her brother's wife. Ability/Limitations of Caregiver: Dtr cleans houses so can arrange assist with sister in law as needed Caregiver Availability: 24/7 Family Dynamics: Daughter here daily and very supportive and encouraging.  Pt reports they have lived together since pt was widowed 20 yrs ago.  Social History Preferred language: English Religion: Baptist Cultural Background: NA Education: college Read: Yes Write: Yes Employment Status: Retired Freight forwarder Issues: None Guardian/Conservator: None - per MD, pt not yet fully  capable of making decisions on her own behalf - defer to daughter.   Abuse/Neglect Physical Abuse: Denies Verbal Abuse: Denies Sexual Abuse: Denies Exploitation of patient/patient's resources: Denies Self-Neglect: Denies  Emotional Status Pt's affect, behavior adn adjustment status: Pt able to complete assessment interview without difficulty.  Smiling throughout but obviously struggles with respiration with lengthier answers.  She admits frustration with extent of limitations, however, reports that "people have told me it gets better" and is hopeful for a good recovery.  She is realistic that it will likley be a lenthy recovery.  She denies any s/s of any emotional distress.  Specifically no s/s of depression - will monitor and refer for neuropsychology consult as indicated. Recent Psychosocial Issues: colon ca and liver involvement with recent treatment feb 2017 Pyschiatric History: None Substance Abuse History: None  Patient / Family Perceptions, Expectations & Goals Pt/Family understanding of illness & functional limitations: Pt report that she was unfamiliar with GBS but feels she and family are getting good information and education about the illness.  Good understanding of her functional limitations/ need for CIR. Premorbid pt/family roles/activities: Completely independent Anticipated changes in roles/activities/participation: Per goals, pt will require 24/7 assistance at home.  Daughter prepared to assume primary caregiver role with intermittent assistance from other family. Pt/family expectations/goals: Pt aware that she will likely d/c at w/c level.  Hopeful she can eventually regain some level of independence.  Community Resources [employer]s: None Premorbid Home Care/DME Agencies: None Transportation available at discharge: yes Resource referrals recommended: Neuropsychology, Support group (specify)  Discharge Planning Living Arrangements: Children Support Systems:  Children, Other relatives, Friends/neighbors Type of Residence: Private residence Insurance Resources: [employer] ([place]) Financial Resources: Radio broadcast assistant Screen Referred: No Living Expenses: Own Money Management: Patient Does the patient have any problems obtaining your medications?: No Patient/Family Preliminary Plans: pt plans to d/c home with daughter as primary caregiver Social Work Anticipated Follow Up Needs: HH/OP Expected length of stay: 24-28 days  Clinical Impression Very pleasant woman here following diagnosis and treatment for GBS.  Very hopeful about gains to be made on CIR.  Developing a good understanding of this disease and recovery process.  Denies any significant emotional distress.  Will monitor and refer for neuropsych as needed.  Daughter living with pt and will be the primary caregiver along with intermittent assist of other family.  They are able to cover 24/7 care.  Will follow for support and d/c planning needs.,  Cynthia [name] 10/30/2015, 2:28 PM

## 2015-10-30 NOTE — Progress Notes (Signed)
NIF-44, VC 1.8L

## 2015-10-30 NOTE — Progress Notes (Signed)
Speech Language Pathology Daily Session Note  Patient Details  Name: Cynthia [name] MRN: [id number] Date of Birth: 02-23-1936  Today's Date: 10/30/2015 SLP Individual Time: 1300-1330 SLP Individual Time Calculation (min): 30 min  Short Term Goals: Week 1: SLP Short Term Goal 1 (Week 1): Patient will consume current diet with minimal overt s/s of aspiration with supervision verbal cues for use of swallowing compenstory strategies.  SLP Short Term Goal 2 (Week 1): Patient will demonstrate sustained attention to functional task for 5 minutes with Min A verbal cues for redirection.  SLP Short Term Goal 2 - Progress (Week 1): Met SLP Short Term Goal 3 (Week 1): Patient will recall at least 1 event from previous therapy sessions with Min A question and verbal cues.  SLP Short Term Goal 3 - Progress (Week 1): Met SLP Short Term Goal 4 (Week 1): Patient will demonstrate efficient mastication with complete oral clearance with a self-percieved effort level of 5 or less in regards to fatigue with regular textures with supervision verbal cues  over 2 consecutive sesions prior to upgrade.    Skilled Therapeutic Interventions: Skilled treatment session focused on dysphagia goals. SLP facilitated session by providing Min A verbal cues for utilization of swallowing compensatory strategies with her lunch meal of Dys. 3 textures with thin liquids via straw. Patient with intermittent overt coughing, however, cough sounded dry in nature and is difficulty to differentiate dysphagia vs baseline cough. Patient self-fed with hand over hand assist with limited PO intake, suspect due to fatigue. Patient's daughter present throughout session and educated on how to appropriately provide supervision and cues. Patient's daughter verbalized and demonstrated understanding and is now signed off to provide supervision. Patient left upright in bed with family present. Continue with current plan of care.     Function:  Eating Eating   Modified Consistency Diet: Yes Eating Assist Level: Helper scoops food on utensil;Hand over hand assist;Help managing cup/glass;Supervision or verbal cues;Set up assist for;Helper feeds patient     Helper Scoops Food on Utensil: Every scoop     Cognition Comprehension Comprehension assist level: Follows basic conversation/direction with no assist  Expression   Expression assist level: Expresses basic 90% of the time/requires cueing < 10% of the time.  Social Interaction Social Interaction assist level: Interacts appropriately with others with medication or extra time (anti-anxiety, antidepressant).  Problem Solving Problem solving assist level: Solves complex problems: With extra time  Memory Memory assist level: Recognizes or recalls 90% of the time/requires cueing < 10% of the time    Pain No reports of pain while upright in bed   Therapy/Group: Individual Therapy  Cordell Coke, [place] 10/30/2015, 3:59 PM

## 2015-10-30 NOTE — Progress Notes (Signed)
Occupational Therapy Note  Patient Details  Name: Cynthia [name] MRN: [id number] Date of Birth: 1936-08-06  Today's Date: 10/30/2015 OT Individual Time: 1330-1430 OT Individual Time Calculation (min): 60 min   Pt c/o continued "freezing" hands and feet with tingling, increasing in intensity with activity; RN aware and meds admin during session Individual Therapy  Pt resting in bed with daughter present.  Pt initially engaged in self care tasks, including combing hair and brushing teeth, requiring hand over hand assist for increased coordination.  Pt exhibited increased strength in shoulders but continues to require assistance for distal gross and fine motor control.  Pt fatigues quickly and requires rest breaks during tasks.  Pt transitioned to BUE tasks including reaching and grasping objects with emphasis on increased control and accuracy. Focus on increased BUE control/strength to increase independence with BADLs.   [name] [facility] 10/30/2015, 2:42 PM

## 2015-10-30 NOTE — Progress Notes (Signed)
Physical Therapy Session Note  Patient Details  Name: Cynthia [name] MRN: [id number] Date of Birth: [birth year]  Today's Date: 10/30/2015 PT Individual Time: 1100-1200 PT Individual Time Calculation (min): 60 min   Short Term Goals: Week 1:  PT Short Term Goal 1 (Week 1): Patient will maintain sitting balance with max A x 3 minutes.  PT Short Term Goal 2 (Week 1): Patient will initiate transfers bed <> wheelchair.  PT Short Term Goal 3 (Week 1): Patient will tolerate OOB x 30 minutes.  PT Short Term Goal 4 (Week 1): Patient will perform rolling with hospital bed functions with max A.   Skilled Therapeutic Interventions/Progress Updates:  Tx focused on rolling and bed mobility, EOB sitting, slidingboard transfers, and WC management. Note positive finding of DVT in LLE, but no change in plan or mobility orders per MD note and discussion with RN. Pt in significant parasthesia pain this afternoon, but agreeable to PT as tolerable.   PT performed passive ROM bil LEs x10 each.  Serial rolling R/L x3 each with max A and rest break between due to fatigue with cues for sequence and technique, including chin tuck and downward gaze.   Supine<>sit x2 with +2 assist with pillow between knees.  Pt needed Max A for supported sitting (PT kneeling at back) 1x8 min and 1x67min with cues for postural control.  Sliding board transfer with +2, pt able to contribute 10%.   Positioned pt in best available WC, which was a reclining chair with gel cushion and elevating leg rests.  Pt left up in [facility] with family and SLP, where she plans to stay until after lunch if able.  Therapy Documentation Precautions:  Precautions Precautions: Fall Precaution Comments: quadriparesis, move BLE very carefully due to pain Restrictions Weight Bearing Restrictions: No    Pain: 8/10, modified tx throughout and rest breaks.     See Function Navigator for Current Functional Status.   Therapy/Group: Individual  Therapy  Terrence Pizana Soundra Pilon, PT, DPT  10/30/2015, 12:03 PM

## 2015-10-30 NOTE — Progress Notes (Signed)
Occupational Therapy Session Note  Patient Details  Name: Cynthia [name] MRN: [id number] Date of Birth: [birth year]  Today's Date: 10/30/2015 OT Individual Time: [id number] OT Individual Time Calculation (min): 60 min    Short Term Goals: Week 1:  OT Short Term Goal 1 (Week 1): Pt will tolerate 7 minutes of functional task with no more than 2 rest breaks in order to increase endurance for functional tasks.  OT Short Term Goal 2 (Week 1): Pt will perform UB bathing  in supported position with min A in order to increase I with self care.  OT Short Term Goal 3 (Week 1): Pt will perform toilet transfer with assistance of 1 person in order to decrease level of assist for functional transfers.  OT Short Term Goal 4 (Week 1): Pt willl tolerate sitting wheelchair for functional tasks at sink for 20 minutes or greater in order to increase activity tolerance.   Skilled Therapeutic Interventions/Progress Updates:    Pt resting in bed stating that her pain had increased since yesterday's therapy but she was willing to work on getting dressed.  Pt engaged in BADL retraining and bed mobility activities.  Focus on dressing at bed level.  Pt initiates rolling to right and left but continues to requires tot A + 2 to complete tasks and max A to maintain side lying position.  Pt was able to thread BUE into shirt sleeves without assistance this morning.  Pt initiates placing head through shirt and pulling down but continues to require assistance to complete.  Pt requires more than a reasonable amount of time to complete tasks with multiple rest breaks throughout session.    Therapy Documentation Precautions:  Precautions Precautions: Fall Precaution Comments: quadriparesis, move BLE very carefully due to pain Restrictions Weight Bearing Restrictions: No   Pain:  Intermittent pain in bilateral hands and BLE rated at 10/10.  Pt also states that her hands and feet are "freezing". MD in attendance and meds  admin by RN    See Function Navigator for Current Functional Status.   Therapy/Group: Individual Therapy  Leroy Libman 10/30/2015, 9:49 AM

## 2015-10-30 NOTE — Progress Notes (Signed)
Asked by pt. to "let me alone, was moving all extremities, appeared to be in no apparent distress and able to lift head from pillow.

## 2015-10-30 NOTE — IPOC Note (Signed)
Overall Plan of Care [facility]) Patient Details Name: Cynthia [name] MRN: [id number] DOB: [birth year]  Admitting Diagnosis: GBS  Hospital Problems: Principal Problem:   Quadriplegia and quadriparesis ([place]) Active Problems:   GBS (Guillain-Barre syndrome) (HCC)   Numbness and tingling   Paresthesias   UTI (lower urinary tract infection)   History of colon cancer   Acute blood loss anemia   Upper GI bleed     Functional Problem List: Nursing Bladder, Bowel, Endurance, Medication Management, Nutrition, Pain, Safety, Sensory, Skin Integrity  PT Balance, Edema, Endurance, Motor, Nutrition, Pain, Safety, Sensory  OT Balance, Cognition, Endurance, Motor, Pain, Perception, Safety, Sensory, Skin Integrity  SLP    TR         Basic ADL's: OT Eating, Grooming, Bathing, Dressing, Toileting     Advanced  ADL's: OT       Transfers: PT Bed Mobility, Bed to Chair, Car, Manufacturing systems engineer, Metallurgist: PT Ambulation, Emergency planning/management officer, Stairs     Additional Impairments: OT    SLP Swallowing, Firefighter, Attention, Awareness, Problem Solving, Social Interaction  TR      Anticipated Outcomes Item Anticipated Outcome  Self Feeding set up A  Swallowing  Supervision    Basic self-care  set up A - mod A  Toileting  mod A   Bathroom Transfers mod A  Bowel/Bladder  Mod assist  Transfers  mod A  Locomotion  mod A wheelchair level  Communication     Cognition  Supervision   Pain  4 or less  Safety/Judgment  mod assist   Therapy Plan: PT Intensity: Minimum of 1-2 x/day ,45 to 90 minutes PT Frequency: 5 out of 7 days PT Duration Estimated Length of Stay: 24-27 days OT Intensity: Minimum of 1-2 x/day, 45 to 90 minutes OT Frequency: 5 out of 7 days OT Duration/Estimated Length of Stay: 24-27 days SLP Intensity: Minumum of 1-2 x/day, 30 to 90 minutes SLP Frequency: 3 to 5 out of 7 days SLP Duration/Estimated Length of Stay: 24-27  days        Team Interventions: Nursing Interventions Patient/Family Education, Bladder Management, Bowel Management, Pain Management, Disease Management/Prevention, Medication Management, Skin Care/Wound Management, Dysphagia/Aspiration Precaution Training  PT interventions Ambulation/gait training, Training and development officer, DME/adaptive equipment instruction, Community reintegration, Discharge planning, Disease management/prevention, Functional electrical stimulation, Functional mobility training, Neuromuscular re-education, Pain management, Psychosocial support, Patient/family education, Splinting/orthotics, Stair training, Therapeutic Activities, Therapeutic Exercise, UE/LE Coordination activities, UE/LE Strength taining/ROM, Wheelchair propulsion/positioning  OT Interventions Training and development officer, Cognitive remediation/compensation, Discharge planning, Community reintegration, Pain management, UE/LE Coordination activities, Skin care/wound managment, Neuromuscular re-education, Functional mobility training, Self Care/advanced ADL retraining, UE/LE Strength taining/ROM, Therapeutic Exercise, Psychosocial support, DME/adaptive equipment instruction, Patient/family education, Therapeutic Activities, Wheelchair propulsion/positioning  SLP Interventions Cognitive remediation/compensation, English as a second language teacher, Dysphagia/aspiration precaution training, Functional tasks, Patient/family education, Therapeutic Activities, Speech/Language facilitation, Internal/external aids, Environmental controls, Therapeutic Exercise  TR Interventions    SW/CM Interventions Discharge Planning, Psychosocial Support, Patient/Family Education    Team Discharge Planning: Destination: PT-Home ,OT- Home , SLP-Home Projected Follow-up: PT-Home health PT, 24 hour supervision/assistance, OT-  Home health OT, SLP-Home Health SLP, 24 hour supervision/assistance Projected Equipment Needs: PT-Wheelchair (measurements),  Wheelchair cushion (measurements), To be determined, OT- To be determined, SLP-None recommended by SLP Equipment Details: PT- , OT-  Patient/family involved in discharge planning: PT- Patient,  OT-Patient, SLP-Patient, Family member/caregiver  MD ELOS: 20-2d Medical Rehab Prognosis:  Fair Assessment: 80 y.o. female with metastatic colon cancer s/p surgery  and chemotherapy, recurrent C-diff, HTN, stroke with lleft sided weakness/numbness, DVT who was admitted on 10/06/15 with complaints of dizziness, paraesthesias of right arm and right leg, numbness of hands and feet, BLE weakness and difficulty with walking. She underwent thermal ablation of liver metastatic lesion on 09/25/15 and has been having low grade fevers PTA. MRI brain negative for stroke.  She developed hypotension with hypotension and large amount of coffee ground emesis on 02/24 and Gi consulted for input. Dr. Paulita Fujita felt symptoms due to esophagitis/gastritis from plavix and recommended serial H/H and hydration.   She was intubated 02/26- 03/05 due to respiratory insufficiency and was treated for HCAP with Vanc/Zosyn. MRI Cervical/thoracic/lumbar spine showed severe neural foraminal stenosis R-C4, R-C5, B-C6 and L-C7 nerve roots and peripheral enhancement of distal thoracic spinal cord is well nerve roots with anterior and posterior suggestive of GBS. Due to areflexia of BLE and hypoactive UE reflexes she was treated with IVIG X 5 doses   Now requiring 24/7 Rehab RN,MD, as well as CIR level PT, OT and SLP.  Treatment team will focus on ADLs and mobility with goals set at Mod A  See Team Conference Notes for weekly updates to the plan of care

## 2015-10-30 NOTE — Progress Notes (Signed)
80 y.o. female with metastatic colon cancer s/p surgery and chemotherapy, recurrent C-diff, HTN, stroke with lleft sided weakness/numbness, DVT who was admitted on 10/06/15 with complaints of dizziness, paraesthesias of right arm and right leg, numbness of hands and feet, BLE weakness and difficulty with walking. She underwent thermal ablation of liver metastatic lesion on 09/25/15 and has been having low grade fevers PTA. MRI brain negative for stroke.  She developed hypotension with hypotension and large amount of coffee ground emesis on 02/24 and Gi consulted for input. Dr. Paulita Fujita felt symptoms due to esophagitis/gastritis from plavix and recommended serial H/H and hydration.   She was intubated 02/26- 03/05 due to respiratory insufficiency and was treated for HCAP with Vanc/Zosyn. MRI Cervical/thoracic/lumbar spine showed severe neural foraminal stenosis R-C4, R-C5, B-C6 and L-C7 nerve roots and peripheral enhancement of distal thoracic spinal cord is well nerve roots with anterior and posterior suggestive of GBS. Due to areflexia of BLE and hypoactive UE reflexes she was treated with IVIG X 5 doses. She has had visual and tactile hallucinations felt to be due to opiates. She underwent EGD 03/1 showing question of proximal linear ulcer question from NGT and she was cleared to resume blood thinners.   BUE strength has been improving and respiratory status stable. FEES 03/6 Showed severe dysphagia and patient kept NPO with tube feeds for nutritional support. Repeat MBS done 03/10   Subjective/Complaints: Did better with dysesthesias until earl this morning when she was awoken for care needs. Now having deep, tingling pain in both feet.   ROS: Pt denies fever, rash/itching, headache, blurred or double vision, nausea, vomiting, abdominal pain, diarrhea, chest pain, shortness of breath, palpitations, dysuria, dizziness, neck or back pain, bleeding, anxiety, or depression  Objective: Vital  Signs: Blood pressure 120/72, pulse 76, temperature 98.1 F (36.7 C), temperature source Oral, resp. rate 17, height 5' 2"  (1.575 m), weight 58.523 kg (129 lb 0.3 oz), SpO2 99 %. No results found. Results for orders placed or performed during the hospital encounter of 10/27/15 (from the past 72 hour(s))  CBC WITH DIFFERENTIAL     Status: Abnormal   Collection Time: 10/28/15  6:52 AM  Result Value Ref Range   WBC 7.0 4.0 - 10.5 K/uL   RBC 3.59 (L) 3.87 - 5.11 MIL/uL   Hemoglobin 10.7 (L) 12.0 - 15.0 g/dL   HCT 32.8 (L) 36.0 - 46.0 %   MCV 91.4 78.0 - 100.0 fL   MCH 29.8 26.0 - 34.0 pg   MCHC 32.6 30.0 - 36.0 g/dL   RDW 16.2 (H) 11.5 - 15.5 %   Platelets 405 (H) 150 - 400 K/uL   Neutrophils Relative % 78 %   Neutro Abs 5.4 1.7 - 7.7 K/uL   Lymphocytes Relative 15 %   Lymphs Abs 1.0 0.7 - 4.0 K/uL   Monocytes Relative 6 %   Monocytes Absolute 0.4 0.1 - 1.0 K/uL   Eosinophils Relative 1 %   Eosinophils Absolute 0.1 0.0 - 0.7 K/uL   Basophils Relative 0 %   Basophils Absolute 0.0 0.0 - 0.1 K/uL  Comprehensive metabolic panel     Status: Abnormal   Collection Time: 10/28/15  6:52 AM  Result Value Ref Range   Sodium 133 (L) 135 - 145 mmol/L   Potassium 4.0 3.5 - 5.1 mmol/L   Chloride 101 101 - 111 mmol/L   CO2 25 22 - 32 mmol/L   Glucose, Bld 94 65 - 99 mg/dL   BUN 13 6 -  20 mg/dL   Creatinine, Ser 0.47 0.44 - 1.00 mg/dL   Calcium 8.4 (L) 8.9 - 10.3 mg/dL   Total Protein 6.1 (L) 6.5 - 8.1 g/dL   Albumin 2.0 (L) 3.5 - 5.0 g/dL   AST 23 15 - 41 U/L   ALT 20 14 - 54 U/L   Alkaline Phosphatase 42 38 - 126 U/L   Total Bilirubin 0.9 0.3 - 1.2 mg/dL   GFR calc non Af Amer >60 >60 mL/min   GFR calc Af Amer >60 >60 mL/min    Comment: (NOTE) The eGFR has been calculated using the CKD EPI equation. This calculation has not been validated in all clinical situations. eGFR's persistently <60 mL/min signify possible Chronic Kidney Disease.    Anion gap 7 5 - 15  Basic metabolic panel      Status: Abnormal   Collection Time: 10/30/15  5:39 AM  Result Value Ref Range   Sodium 135 135 - 145 mmol/L   Potassium 4.3 3.5 - 5.1 mmol/L   Chloride 101 101 - 111 mmol/L   CO2 27 22 - 32 mmol/L   Glucose, Bld 100 (H) 65 - 99 mg/dL   BUN 13 6 - 20 mg/dL   Creatinine, Ser 0.55 0.44 - 1.00 mg/dL   Calcium 8.7 (L) 8.9 - 10.3 mg/dL   GFR calc non Af Amer >60 >60 mL/min   GFR calc Af Amer >60 >60 mL/min    Comment: (NOTE) The eGFR has been calculated using the CKD EPI equation. This calculation has not been validated in all clinical situations. eGFR's persistently <60 mL/min signify possible Chronic Kidney Disease.    Anion gap 7 5 - 15     HEENT: normal Cardio: RRR Resp: CTA B/L GI: BS positive and NT, ND Extremity:  No Edema Skin:   Intact Neuro: Alert/Oriented, Abnormal Sensory absent LT and Proprio in BLE, and Abnormal Motor 3+ LUE, 3/5 RUE, 2- B hip ext KE tr-1, ADF/APF trace. Sensation decreased in stocking glove distribution in both legs. Has little sense of gross touch in either foot. Hands/UE's with similar pattern but much less affected.  Musc/Skel:  Full ROM both ue and lower ext. No edema Gen Fatigued but NAD Psych: anxious    Assessment/Plan: 1. 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 Physiatrist is providing close team supervision and 24 hour management of active medical problems listed below. Physiatrist and rehab team continue to assess barriers to discharge/monitor patient progress toward functional and medical goals. FIM: Function - Bathing Bathing activity did not occur: Safety/medical concerns (nursing night bath) Position: Bed Body parts bathed by patient: Chest Body parts bathed by helper: Right arm, Left arm, Abdomen, Front perineal area, Buttocks, Left upper leg, Right upper leg, Right lower leg, Left lower leg Bathing not  applicable: Back Assist Level:  (total A)  Function- Upper Body Dressing/Undressing What is the patient wearing?: Pull over shirt/dress Pull over shirt/dress - Perfomed by patient: Thread/unthread right sleeve, Thread/unthread left sleeve Pull over shirt/dress - Perfomed by helper: Put head through opening, Pull shirt over trunk Assist Level: 2 helpers Function - Lower Body Dressing/Undressing What is the patient wearing?: Underwear, Pants, AFO, Ted Hose Position: Bed Underwear - Performed by helper: Thread/unthread right underwear leg, Thread/unthread left underwear leg, Pull underwear up/down Pants- Performed by helper: Thread/unthread right pants leg, Thread/unthread left pants leg, Pull pants up/down Non-skid slipper socks- Performed by  helper: Don/doff right sock, Don/doff left sock AFO - Performed by helper: Don/doff right AFO, Don/doff left AFO TED Hose - Performed by helper: Don/doff right TED hose, Don/doff left TED hose Assist for footwear: Dependant Assist for lower body dressing: 2 Helpers  Function - Toileting Toileting activity did not occur: No continent bowel/bladder event  Function - Air cabin crew transfer activity did not occur: Safety/medical concerns  Function - Chair/bed transfer Chair/bed transfer activity did not occur: Safety/medical concerns (unable to tolerate sitting upright EOB )  Function - Locomotion: Wheelchair Will patient use wheelchair at discharge?: Yes Wheelchair activity did not occur: Safety/medical concerns (unable to tolerate sitting upright EOB) Function - Locomotion: Ambulation Ambulation activity did not occur: Safety/medical concerns  Function - Comprehension Comprehension: Auditory Comprehension assist level: Follows basic conversation/direction with no assist  Function - Expression Expression: Verbal Expression assist level: Expresses basic 90% of the time/requires cueing < 10% of the time.  Function - Social  Interaction Social Interaction assist level: Interacts appropriately with others with medication or extra time (anti-anxiety, antidepressant).  Function - Problem Solving Problem solving assist level: Solves complex problems: With extra time  Function - Memory Memory assist level: Recognizes or recalls 90% of the time/requires cueing < 10% of the time Patient normally able to recall (first 3 days only): Current season, Location of own room, Staff names and faces, That he or she is in a hospital  Medical Problem List and Plan: 1.  Quadriplegia, numbness, gait instability secondary to GBS  -continue CIR. 2.  DVT Prophylaxis/Anticoagulation: Pharmaceutical: Lovenox  -dopplers with LLE peroneal/gastroc dvt---no change in plan  -re doppler prior to discharge to determine future course 3. Pain Management: Tylenol prn. Limit narcotics due to cognitive effects     -gabapentin helpful. Increase pm dose to 26m  -continue TEDS also as these proved beneficial as well 4. Mood: LCSW to follow for evaluation and support.   5. Neuropsych: This patient is not fully capable of making decisions on her own behalf. 6. Skin/Wound Care: Routine pressure relief measures.   7. Fluids/Electrolytes/Nutrition: Monitor I/O.   -need to watch sodium levels--recheck tomorrow  -encourage PO.   8.  Citrobacter Koseri UTI: treated with ctx  -voiding trial, I/O cath prn. 9. Colon cancer with liver mets: No complaints of pain.    10. UGIB with ABLA: H/H stable and anemia resolving. Continue to monitor for any recurrent signs of bleeding. Am Hgb 10.7, stable   LOS (Days) 3 A FACE TO FACE EVALUATION WAS PERFORMED  Chancellor Vanderloop T 10/30/2015, 10:07 AM

## 2015-10-30 NOTE — Progress Notes (Addendum)
NIF -42 and VC 1.6L. Tried to get patient to attempt both a second time but patient wanted to be left alone to sleep.

## 2015-10-31 ENCOUNTER — Inpatient Hospital Stay (HOSPITAL_COMMUNITY): Payer: Medicare Other | Admitting: Physical Therapy

## 2015-10-31 ENCOUNTER — Inpatient Hospital Stay (HOSPITAL_COMMUNITY): Payer: Medicare Other | Admitting: *Deleted

## 2015-10-31 ENCOUNTER — Inpatient Hospital Stay (HOSPITAL_COMMUNITY): Payer: Self-pay | Admitting: Physical Therapy

## 2015-10-31 ENCOUNTER — Inpatient Hospital Stay (HOSPITAL_COMMUNITY): Payer: Medicare Other | Admitting: Speech Pathology

## 2015-10-31 MED ORDER — GABAPENTIN 100 MG PO CAPS
200.0000 mg | ORAL_CAPSULE | Freq: Three times a day (TID) | ORAL | Status: DC
  Administered 2015-10-31 – 2015-11-01 (×5): 200 mg via ORAL
  Filled 2015-10-31 (×5): qty 2

## 2015-10-31 NOTE — Progress Notes (Signed)
Physical Therapy Session Note  Patient Details  Name: LEANOR MASSIE MRN: [id number] Date of Birth: 24-Aug-1935  Today's Date: 10/31/2015 PT Individual Time: 1300-1345 PT Individual Time Calculation (min): 45 min     Skilled Therapeutic Interventions/Progress Updates:  Patient in bed with complains of wide spread pain. Agrees to therapy at bed level as she is worried about her BP, monitored 107/63 in supine . Passive ROM exercises performed to B Le with trace motion in hip flexors and extensors, AAROM for UE.  Sensory stimulation with gentle massage to b feet. Noted red non blanching area on R big toe and callused areas on second two toes, reported to nursing.    Therapy Documentation Precautions:  Precautions Precautions: Fall Precaution Comments: quadriparesis, move BLE very carefully due to pain Restrictions Weight Bearing Restrictions: No   See Function Navigator for Current Functional Status.   Therapy/Group: Individual Therapy  Guadlupe Spanish 10/31/2015, 4:01 PM

## 2015-10-31 NOTE — Progress Notes (Signed)
Physical Therapy Session Note  Patient Details  Name: Cynthia [name] MRN: [id number] Date of Birth: August 03, 1936  Today's Date: 10/31/2015 PT Individual Time: 1045-1200 PT Individual Time Calculation (min): 75 min   Short Term Goals: Week 1:  PT Short Term Goal 1 (Week 1): Patient will maintain sitting balance with max A x 3 minutes.  PT Short Term Goal 2 (Week 1): Patient will initiate transfers bed <> wheelchair.  PT Short Term Goal 3 (Week 1): Patient will tolerate OOB x 30 minutes.  PT Short Term Goal 4 (Week 1): Patient will perform rolling with hospital bed functions with max A.   Skilled Therapeutic Interventions/Progress Updates:  Pt was seen bedside in the am. Pt educated on treatment prior to all activities. Pt performed AAROM B LEs, 2 sets x 5 reps each. Pt rolled R/L with side rails, multiple times with max A and verbal cues. Pt transferred supine to edge of bed with total A and verbal cues. Pt tolerated edge of bed about 5 minutes with c/o feeling warm all over. Pt returned to supine with total A and verbal cues. BP was 107/69, HR 95 and O2 sat 98%. Following rest break. Pt transferred supine to edge of bed with total A and verbal cues. BP on edge of bed 78/57, pt returned to supine with total A and verbal cues. Pt moved up in bed with total A. BP retaken 120/74 in supine. Pt positioned for comfort and pt's nurse notified of BP during treatment.   Therapy Documentation Precautions:  Precautions Precautions: Fall Precaution Comments: quadriparesis, move BLE very carefully due to pain Restrictions Weight Bearing Restrictions: No General:   Vital Signs: Therapy Vitals Pulse Rate: 95 BP: 120/74 mmHg (per physical therapy) Patient Position (if appropriate): Lying Oxygen Therapy SpO2: 98 % O2 Device: Not Delivered Pain: Pt c/o 4/10 pain B LEs.   See Function Navigator for Current Functional Status.   Therapy/Group: Individual Therapy  Dub Amis 10/31/2015,  12:48 PM

## 2015-10-31 NOTE — Plan of Care (Signed)
Problem: SCI BLADDER ELIMINATION Goal: RH STG MANAGE BLADDER WITH ASSISTANCE STG Manage Bladder With max Assistance  Outcome: Not Progressing I and O cath q 8 hours

## 2015-10-31 NOTE — Progress Notes (Signed)
1419 10/31/15 nursing Md made aware of patient's BP during therapy. No new orders.

## 2015-10-31 NOTE — Progress Notes (Signed)
Physical Therapy Note  Patient Details  Name: Cynthia [name] MRN: [id number] Date of Birth: 12/09/1935 Today's Date: 10/31/2015    Time: 915-958 43 minutes  1:1 pt c/o neuropathic pain "tingling and cold" in B LEs, RN made aware.  PROM for B LEs in all directions with pt able to tolerate R LE more than L LE movements.  Pt able to activate hip extensors to assist with AAROM for Rt LE.  Rolling practice to both sides with pt able to activate abdominal musculature after multiple attempts and repetitions.  Attempt to sit EOB but pt unable to tolerate position due to pain.  Scooting up in bed with pt able to activate hip extensors to assist 10%, still total A for bed mobility.   Leighann Amadon 10/31/2015, 9:58 AM

## 2015-10-31 NOTE — Progress Notes (Signed)
10/31/15 1212 nursing Physical therapist told RN that pt c/o hot all over during therapy. V/s at 1145 lying 107/69 98% RA and HR 95; 1150 sitting 78/57 and 1152 lying 120/74. No further complaints. We'll continue to monitor.

## 2015-10-31 NOTE — Progress Notes (Signed)
Speech Language Pathology Daily Session Note  Patient Details  Name: Cynthia [name] MRN: [id number] Date of Birth: 11/21/1935  Today's Date: 10/31/2015 SLP Individual Time: 1500-1530 SLP Individual Time Calculation (min): 30 min  Short Term Goals: Week 1: SLP Short Term Goal 1 (Week 1): Patient will consume current diet with minimal overt s/s of aspiration with supervision verbal cues for use of swallowing compenstory strategies.  SLP Short Term Goal 2 (Week 1): Patient will demonstrate sustained attention to functional task for 5 minutes with Min A verbal cues for redirection.  SLP Short Term Goal 2 - Progress (Week 1): Met SLP Short Term Goal 3 (Week 1): Patient will recall at least 1 event from previous therapy sessions with Min A question and verbal cues.  SLP Short Term Goal 3 - Progress (Week 1): Met SLP Short Term Goal 4 (Week 1): Patient will demonstrate efficient mastication with complete oral clearance with a self-percieved effort level of 5 or less in regards to fatigue with regular textures with supervision verbal cues  over 2 consecutive sesions prior to upgrade.   Skilled Therapeutic Interventions: Pt participated in therapy focusing on use of compensatory strategies for dysphagia management. Daughter present and reports her mother ate very well at lunch and she observed swallow precautions. Pt able to state swallow precautions independently. Pt assisted with meal. Pt able to observe swallow precautions with ~95% acc. 1 episode of swallow without chin tuck. Oral clearance achieved with all trials. No overt s/s aspiration.    Function:  Eating Eating   Modified Consistency Diet: Yes Eating Assist Level: Helper scoops food on utensil;Hand over hand assist;Help managing cup/glass;Supervision or verbal cues;Set up assist for;Helper feeds patient     Helper Scoops Food on Utensil: Every scoop     Cognition Comprehension Comprehension assist level: Follows complex  conversation/direction with no assist  Expression   Expression assist level: Expresses complex ideas: With extra time/assistive device  Social Interaction Social Interaction assist level: Interacts appropriately with others with medication or extra time (anti-anxiety, antidepressant).  Problem Solving Problem solving assist level: Solves complex problems: With extra time  Memory Memory assist level: Recognizes or recalls 90% of the time/requires cueing < 10% of the time    Pain Pain Assessment Pain Assessment: No/denies pain  Therapy/Group: Individual Therapy  Vinetta Bergamo 10/31/2015, 4:45 PM

## 2015-10-31 NOTE — Progress Notes (Signed)
NIF -42 and VC 1.25L with good effort.

## 2015-10-31 NOTE — Progress Notes (Signed)
80 y.o. female with metastatic colon cancer s/p surgery and chemotherapy, recurrent C-diff, HTN, stroke with lleft sided weakness/numbness, DVT who was admitted on 10/06/15 with complaints of dizziness, paraesthesias of right arm and right leg, numbness of hands and feet, BLE weakness and difficulty with walking. She underwent thermal ablation of liver metastatic lesion on 09/25/15 and has been having low grade fevers PTA. MRI brain negative for stroke.  She developed hypotension with hypotension and large amount of coffee ground emesis on 02/24 and Gi consulted for input. Dr. Paulita Fujita felt symptoms due to esophagitis/gastritis from plavix and recommended serial H/H and hydration.   She was intubated 02/26- 03/05 due to respiratory insufficiency and was treated for HCAP with Vanc/Zosyn. MRI Cervical/thoracic/lumbar spine showed severe neural foraminal stenosis R-C4, R-C5, B-C6 and L-C7 nerve roots and peripheral enhancement of distal thoracic spinal cord is well nerve roots with anterior and posterior suggestive of GBS. Due to areflexia of BLE and hypoactive UE reflexes she was treated with IVIG X 5 doses. She has had visual and tactile hallucinations felt to be due to opiates. She underwent EGD 03/1 showing question of proximal linear ulcer question from NGT and she was cleared to resume blood thinners.   BUE strength has been improving and respiratory status stable. FEES 03/6 Showed severe dysphagia and patient kept NPO with tube feeds for nutritional support. Repeat MBS done 03/10   Subjective/Complaints: Dysesthesias better in feet/hands. Noticed some symptoms this morning after breakfast.   ROS: Pt denies fever, rash/itching, headache, blurred or double vision, nausea, vomiting, abdominal pain, diarrhea, chest pain, shortness of breath, palpitations, dysuria, dizziness, neck or back pain, bleeding, anxiety, or depression  Objective: Vital Signs: Blood pressure 120/74, pulse 95, temperature  98.6 F (37 C), temperature source Oral, resp. rate 18, height 5' 2"  (1.575 m), weight 60.328 kg (133 lb), SpO2 98 %. No results found. Results for orders placed or performed during the hospital encounter of 10/27/15 (from the past 72 hour(s))  Basic metabolic panel     Status: Abnormal   Collection Time: 10/30/15  5:39 AM  Result Value Ref Range   Sodium 135 135 - 145 mmol/L   Potassium 4.3 3.5 - 5.1 mmol/L   Chloride 101 101 - 111 mmol/L   CO2 27 22 - 32 mmol/L   Glucose, Bld 100 (H) 65 - 99 mg/dL   BUN 13 6 - 20 mg/dL   Creatinine, Ser 0.55 0.44 - 1.00 mg/dL   Calcium 8.7 (L) 8.9 - 10.3 mg/dL   GFR calc non Af Amer >60 >60 mL/min   GFR calc Af Amer >60 >60 mL/min    Comment: (NOTE) The eGFR has been calculated using the CKD EPI equation. This calculation has not been validated in all clinical situations. eGFR's persistently <60 mL/min signify possible Chronic Kidney Disease.    Anion gap 7 5 - 15     HEENT: normal Cardio: RRR Resp: CTA B/L GI: BS positive and NT, ND Extremity:  No Edema Skin:   Intact Neuro: Alert/Oriented, Abnormal Sensory absent LT and Proprio in BLE, and Abnormal Motor 3+ LUE, 3/5 RUE, 2- B hip ext KE tr-1, ADF/APF trace. Sensation decreased in stocking glove distribution in both legs. Has little sense of gross touch in either foot. Hands/UE's with similar pattern but much less affected.  Musc/Skel:  Full ROM both ue and lower ext. No edema Gen Fatigued but NAD Psych: less anxious    Assessment/Plan: 1. 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 Physiatrist is providing close team supervision and 24 hour management of active medical problems listed below. Physiatrist and rehab team continue to assess barriers to discharge/monitor patient progress toward functional and medical goals. FIM: Function - Bathing Bathing activity did not  occur: Safety/medical concerns (nursing night bath) Position: Bed Body parts bathed by patient: Chest Body parts bathed by helper: Right arm, Left arm, Abdomen, Front perineal area, Buttocks, Left upper leg, Right upper leg, Right lower leg, Left lower leg Bathing not applicable: Back Assist Level:  (total A)  Function- Upper Body Dressing/Undressing What is the patient wearing?: Pull over shirt/dress Pull over shirt/dress - Perfomed by patient: Thread/unthread right sleeve, Thread/unthread left sleeve Pull over shirt/dress - Perfomed by helper: Put head through opening, Pull shirt over trunk Assist Level: 2 helpers Function - Lower Body Dressing/Undressing What is the patient wearing?: Underwear, Pants, AFO, Ted Hose Position: Bed Underwear - Performed by helper: Thread/unthread right underwear leg, Thread/unthread left underwear leg, Pull underwear up/down Pants- Performed by helper: Thread/unthread right pants leg, Thread/unthread left pants leg, Pull pants up/down Non-skid slipper socks- Performed by helper: Don/doff right sock, Don/doff left sock AFO - Performed by helper: Don/doff right AFO, Don/doff left AFO TED Hose - Performed by helper: Don/doff right TED hose, Don/doff left TED hose Assist for footwear: Dependant Assist for lower body dressing: 2 Helpers  Function - Toileting Toileting activity did not occur: No continent bowel/bladder event  Function - Air cabin crew transfer activity did not occur: Safety/medical concerns  Function - Chair/bed transfer Chair/bed transfer activity did not occur: Safety/medical concerns (unable to tolerate sitting upright EOB ) Chair/bed transfer method: Lateral scoot Chair/bed transfer assist level: 2 helpers Chair/bed transfer assistive device: Sliding board Chair/bed transfer details: Manual facilitation for weight shifting, Manual facilitation for placement, Manual facilitation for weight bearing, Verbal cues for sequencing,  Verbal cues for technique, Verbal cues for precautions/safety, Verbal cues for safe use of DME/AE  Function - Locomotion: Wheelchair Will patient use wheelchair at discharge?: Yes Wheelchair activity did not occur: Safety/medical concerns (Too fatigued once up in [place] ) Function - Locomotion: Ambulation Ambulation activity did not occur: Safety/medical concerns  Function - Comprehension Comprehension: Auditory Comprehension assist level: Follows basic conversation/direction with no assist  Function - Expression Expression: Verbal Expression assist level: Expresses basic 90% of the time/requires cueing < 10% of the time.  Function - Social Interaction Social Interaction assist level: Interacts appropriately with others with medication or extra time (anti-anxiety, antidepressant).  Function - Problem Solving Problem solving assist level: Solves complex problems: With extra time  Function - Memory Memory assist level: Recognizes or recalls 90% of the time/requires cueing < 10% of the time Patient normally able to recall (first 3 days only): Current season, Location of own room, Staff names and faces, That he or she is in a hospital  Medical Problem List and Plan: 1.  Quadriplegia, numbness, gait instability secondary to GBS  -continue CIR. 2.  DVT Prophylaxis/Anticoagulation: Pharmaceutical: Lovenox  -dopplers with LLE peroneal/gastroc dvt---no change in plan  -re doppler prior to discharge to determine future course 3. Pain Management: Tylenol prn. Limit narcotics due to cognitive effects     -gabapentin helpful. Increase to 214m tid  -continue TEDS also as these proved beneficial as well 4. Mood: LCSW to follow for evaluation and support.   5. Neuropsych: This patient is not fully capable of making decisions on her  own behalf. 6. Skin/Wound Care: Routine pressure relief measures.   7. Fluids/Electrolytes/Nutrition: Monitor I/O.   -need to watch sodium levels--recheck  tomorrow  -encourage PO.   8.  Citrobacter Koseri UTI: treated with ctx  -voiding trial, I/O cath prn. 9. Colon cancer with liver mets: No complaints of pain.    10. UGIB with ABLA: H/H stable and anemia resolving. Continue to monitor for any recurrent signs of bleeding. Am Hgb 10.7, stable  -encourage fluids and ongoing acclimation.   LOS (Days) 4 A FACE TO FACE EVALUATION WAS PERFORMED  Andrez Lieurance T 10/31/2015, 12:20 PM

## 2015-11-01 ENCOUNTER — Inpatient Hospital Stay (HOSPITAL_COMMUNITY): Payer: Self-pay | Admitting: Physical Therapy

## 2015-11-01 NOTE — Progress Notes (Signed)
NIF -50cmH20,  And VC 1.7L.  Good effort

## 2015-11-01 NOTE — Progress Notes (Signed)
80 y.o. female with metastatic colon cancer s/p surgery and chemotherapy, recurrent C-diff, HTN, stroke with lleft sided weakness/numbness, DVT who was admitted on 10/06/15 with complaints of dizziness, paraesthesias of right arm and right leg, numbness of hands and feet, BLE weakness and difficulty with walking. She underwent thermal ablation of liver metastatic lesion on 09/25/15 and has been having low grade fevers PTA. MRI brain negative for stroke.  She developed hypotension with hypotension and large amount of coffee ground emesis on 02/24 and Gi consulted for input. Dr. Paulita Fujita felt symptoms due to esophagitis/gastritis from plavix and recommended serial H/H and hydration.   She was intubated 02/26- 03/05 due to respiratory insufficiency and was treated for HCAP with Vanc/Zosyn. MRI Cervical/thoracic/lumbar spine showed severe neural foraminal stenosis R-C4, R-C5, B-C6 and L-C7 nerve roots and peripheral enhancement of distal thoracic spinal cord is well nerve roots with anterior and posterior suggestive of GBS. Due to areflexia of BLE and hypoactive UE reflexes she was treated with IVIG X 5 doses. She has had visual and tactile hallucinations felt to be due to opiates. She underwent EGD 03/1 showing question of proximal linear ulcer question from NGT and she was cleared to resume blood thinners.   BUE strength has been improving and respiratory status stable. FEES 03/6 Showed severe dysphagia and patient kept NPO with tube feeds for nutritional support. Repeat MBS done 03/10   Subjective/Complaints: Has the tingling in hands and feet---overall seems more tolerable   ROS: Pt denies fever, rash/itching, headache, blurred or double vision, nausea, vomiting, abdominal pain, diarrhea, chest pain, shortness of breath, palpitations, dysuria, dizziness, neck or back pain, bleeding, anxiety, or depression  Objective: Vital Signs: Blood pressure 116/63, pulse 70, temperature 97.8 F (36.6 C),  temperature source Oral, resp. rate 18, height 5' 2"  (1.575 m), weight 59.875 kg (132 lb), SpO2 97 %. No results found. Results for orders placed or performed during the hospital encounter of 10/27/15 (from the past 72 hour(s))  Basic metabolic panel     Status: Abnormal   Collection Time: 10/30/15  5:39 AM  Result Value Ref Range   Sodium 135 135 - 145 mmol/L   Potassium 4.3 3.5 - 5.1 mmol/L   Chloride 101 101 - 111 mmol/L   CO2 27 22 - 32 mmol/L   Glucose, Bld 100 (H) 65 - 99 mg/dL   BUN 13 6 - 20 mg/dL   Creatinine, Ser 0.55 0.44 - 1.00 mg/dL   Calcium 8.7 (L) 8.9 - 10.3 mg/dL   GFR calc non Af Amer >60 >60 mL/min   GFR calc Af Amer >60 >60 mL/min    Comment: (NOTE) The eGFR has been calculated using the CKD EPI equation. This calculation has not been validated in all clinical situations. eGFR's persistently <60 mL/min signify possible Chronic Kidney Disease.    Anion gap 7 5 - 15     HEENT: normal Cardio: RRR Resp: CTA B/L GI: BS positive and NT, ND Extremity:  No Edema Skin:   Intact Neuro: Alert/Oriented, Abnormal Sensory absent LT and Proprio in BLE, and Abnormal Motor 3+ LUE, 3/5 RUE, 2- B hip ext KE tr-1, ADF/APF trace. Sensation decreased in stocking glove distribution in both legs. Has little sense of gross touch in either foot. Hands/UE's with similar pattern but much less affected.  Musc/Skel:  Full ROM both ue and lower ext. No edema Gen Fatigued but NAD Psych: appropriate  Assessment/Plan: 1. 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 Physiatrist is providing close team supervision and 24 hour management of active medical problems listed below. Physiatrist and rehab team continue to assess barriers to discharge/monitor patient progress toward functional and medical goals. FIM: Function - Bathing Bathing activity did not occur: Safety/medical  concerns (nursing night bath) Position: Bed Body parts bathed by patient: Chest Body parts bathed by helper: Right arm, Left arm, Abdomen, Front perineal area, Buttocks, Left upper leg, Right upper leg, Right lower leg, Left lower leg Bathing not applicable: Back Assist Level:  (total A)  Function- Upper Body Dressing/Undressing What is the patient wearing?: Pull over shirt/dress Pull over shirt/dress - Perfomed by patient: Thread/unthread right sleeve, Thread/unthread left sleeve Pull over shirt/dress - Perfomed by helper: Put head through opening, Pull shirt over trunk Assist Level: 2 helpers Function - Lower Body Dressing/Undressing What is the patient wearing?: Underwear, Pants, AFO, Ted Hose Position: Bed Underwear - Performed by helper: Thread/unthread right underwear leg, Thread/unthread left underwear leg, Pull underwear up/down Pants- Performed by helper: Thread/unthread right pants leg, Thread/unthread left pants leg, Pull pants up/down Non-skid slipper socks- Performed by helper: Don/doff right sock, Don/doff left sock AFO - Performed by helper: Don/doff right AFO, Don/doff left AFO TED Hose - Performed by helper: Don/doff right TED hose, Don/doff left TED hose Assist for footwear: Dependant Assist for lower body dressing: 2 Helpers  Function - Toileting Toileting activity did not occur: No continent bowel/bladder event  Function - Air cabin crew transfer activity did not occur: Safety/medical concerns  Function - Chair/bed transfer Chair/bed transfer activity did not occur: Safety/medical concerns (unable to tolerate sitting upright EOB ) Chair/bed transfer method: Lateral scoot Chair/bed transfer assist level: 2 helpers Chair/bed transfer assistive device: Sliding board Chair/bed transfer details: Manual facilitation for weight shifting, Manual facilitation for placement, Manual facilitation for weight bearing, Verbal cues for sequencing, Verbal cues for  technique, Verbal cues for precautions/safety, Verbal cues for safe use of DME/AE  Function - Locomotion: Wheelchair Will patient use wheelchair at discharge?: Yes Wheelchair activity did not occur: Safety/medical concerns (Too fatigued once up in [place] ) Function - Locomotion: Ambulation Ambulation activity did not occur: Safety/medical concerns  Function - Comprehension Comprehension: Auditory Comprehension assist level: Follows basic conversation/direction with no assist  Function - Expression Expression: Verbal Expression assist level: Expresses basic 90% of the time/requires cueing < 10% of the time.  Function - Social Interaction Social Interaction assist level: Interacts appropriately with others with medication or extra time (anti-anxiety, antidepressant).  Function - Problem Solving Problem solving assist level: Solves complex problems: With extra time  Function - Memory Memory assist level: Recognizes or recalls 90% of the time/requires cueing < 10% of the time Patient normally able to recall (first 3 days only): Current season, Location of own room, Staff names and faces, That he or she is in a hospital  Medical Problem List and Plan: 1.  Quadriplegia, numbness, gait instability secondary to GBS  -continue CIR. 2.  DVT Prophylaxis/Anticoagulation: Pharmaceutical: Lovenox  -dopplers with LLE peroneal/gastroc dvt---no change in plan at present  -re doppler prior to discharge to determine future course 3. Pain Management: Tylenol prn. Limit narcotics due to cognitive effects     -gabapentin helpful. Increased to 245m tid yesterday  -continue TEDS also as these proved beneficial as well 4. Mood: LCSW to follow for evaluation and support.   5. Neuropsych: This patient is not fully capable of making decisions on her own  behalf. 6. Skin/Wound Care: Routine pressure relief measures.   7. Fluids/Electrolytes/Nutrition: Monitor I/O.   -need to watch sodium levels--135 on  friday  -encourage PO.   8.  Citrobacter Koseri UTI: treated with ctx  -voiding trial, I/O cath prn.---low pvr's/volumes 9. Colon cancer with liver mets: No complaints of pain.    10. UGIB with ABLA: H/H stable and anemia resolving. Continue to monitor for any recurrent signs of bleeding. Am Hgb 10.7, stable  -encourage fluids and ongoing acclimation.   LOS (Days) 5 A FACE TO FACE EVALUATION WAS PERFORMED  SWARTZ,ZACHARY T 11/01/2015, 11:06 AM

## 2015-11-01 NOTE — Progress Notes (Signed)
NIF -55cmH2O  VC 1.3L Good effort. RT will continue to monitor as needed.

## 2015-11-02 ENCOUNTER — Inpatient Hospital Stay (HOSPITAL_COMMUNITY): Payer: Medicare Other | Admitting: Speech Pathology

## 2015-11-02 ENCOUNTER — Inpatient Hospital Stay (HOSPITAL_COMMUNITY): Payer: Medicare Other

## 2015-11-02 ENCOUNTER — Inpatient Hospital Stay (HOSPITAL_COMMUNITY): Payer: Self-pay

## 2015-11-02 MED ORDER — GABAPENTIN 300 MG PO CAPS
300.0000 mg | ORAL_CAPSULE | Freq: Every day | ORAL | Status: DC
  Administered 2015-11-02 – 2015-11-08 (×7): 300 mg via ORAL
  Filled 2015-11-02 (×7): qty 1

## 2015-11-02 MED ORDER — GABAPENTIN 100 MG PO CAPS
200.0000 mg | ORAL_CAPSULE | Freq: Two times a day (BID) | ORAL | Status: DC
  Administered 2015-11-02 – 2015-11-09 (×15): 200 mg via ORAL
  Filled 2015-11-02 (×15): qty 2

## 2015-11-02 NOTE — Progress Notes (Signed)
Occupational Therapy Session Note  Patient Details  Name: Cynthia [name] MRN: [id number] Date of Birth: 01-14-1936  Today's Date: 11/02/2015 OT Individual Time: 0800-0900 OT Individual Time Calculation (min): 60 min    Short Term Goals: Week 1:  OT Short Term Goal 1 (Week 1): Pt will tolerate 7 minutes of functional task with no more than 2 rest breaks in order to increase endurance for functional tasks.  OT Short Term Goal 2 (Week 1): Pt will perform UB bathing  in supported position with min A in order to increase I with self care.  OT Short Term Goal 3 (Week 1): Pt will perform toilet transfer with assistance of 1 person in order to decrease level of assist for functional transfers.  OT Short Term Goal 4 (Week 1): Pt willl tolerate sitting wheelchair for functional tasks at sink for 20 minutes or greater in order to increase activity tolerance.   Skilled Therapeutic Interventions/Progress Updates:    Pt resting in bed upon arrival.  Pt stated that she thought her pain was worse today and voiced her frustration with her perceived lack of progress.  Pt agreeable to eating breakfast before dressing.  Pt exhibited increased control of gross motor movement with bringing utensil to mouth but continues to require some hand over hand distally.  Pt engaged in bed mobility (rolling right and left) to facilitate her pants being pulled over hips.  Pt performed rolling tasks with mod A using bed rails.  Pt threaded bilateral UE into shirt sleeves while seated with HOB elevated.  Pt initiated placing shirt over head but required min A to complete task.  Pt c/o increased BLE pain when sitting >=90 degrees to facilitate pulling her shirt down in the back.  Focus on activity tolerance, bed mobility, UB dressing tasks, self feeding, and grooming to increase independence with BADLs and decrease burden of care.  Therapy Documentation Precautions:  Precautions Precautions: Fall Precaution Comments:  quadriparesis, move BLE very carefully due to pain Restrictions Weight Bearing Restrictions: No Pain: Pain Assessment Pain Assessment: 0-10 Pain Score: 10-Worst pain ever Pain Type: Neuropathic pain Pain Location: Hand Pain Orientation: Right;Left Pain Descriptors / Indicators: Tingling Pain Frequency: Intermittent Pain Onset: With Activity Pain Intervention(s): Premedicated Multiple Pain Sites: No  See Function Navigator for Current Functional Status.   Therapy/Group: Individual Therapy  Leroy Libman 11/02/2015, 9:04 AM

## 2015-11-02 NOTE — Progress Notes (Signed)
NIF -40 FVC 1.6L Good effort

## 2015-11-02 NOTE — Progress Notes (Signed)
Patient with complaints of pain, tingling, and stiffness to hands during the night. Patient unable to sleep. Will continue to turn patient, reposition, and monitor for comfort. adm

## 2015-11-02 NOTE — Progress Notes (Signed)
Speech Language Pathology Daily Session Note  Patient Details  Name: Cynthia [name] MRN: [id number] Date of Birth: 10-31-1935  Today's Date: 11/02/2015  Session 1: SLP Individual Time: 0900-0930 SLP Individual Time Calculation (min): 30 min   Session 2: SLP Individual Time: 3009-2330 SLP Individual Time Calculation (min): 50 min  Short Term Goals: Week 1: SLP Short Term Goal 1 (Week 1): Patient will consume current diet with minimal overt s/s of aspiration with supervision verbal cues for use of swallowing compenstory strategies.  SLP Short Term Goal 2 (Week 1): Patient will demonstrate sustained attention to functional task for 5 minutes with Min A verbal cues for redirection.  SLP Short Term Goal 2 - Progress (Week 1): Met SLP Short Term Goal 3 (Week 1): Patient will recall at least 1 event from previous therapy sessions with Min A question and verbal cues.  SLP Short Term Goal 3 - Progress (Week 1): Met SLP Short Term Goal 4 (Week 1): Patient will demonstrate efficient mastication with complete oral clearance with a self-percieved effort level of 5 or less in regards to fatigue with regular textures with supervision verbal cues  over 2 consecutive sesions prior to upgrade.   Skilled Therapeutic Interventions:  Session 1: Skilled treatment session focused on dysphagia goals. Upon arrival, patient was sitting upright in bed. Patient consumed trials of thin liquids via straw without overt s/s of aspiration and was Mod I for use of swallowing compensatory strategies. Patient left supine in bed with all needs within reach. Continue with current plan of care.   Session 2:  Skilled treatment session focused on dysphagia goals. Upon arrival, patient was sitting upright in bed. Patient consumed lunch meal of Dys. 3 textures with thin liquids via straw without overt s/s of aspiration and required supervision verbal cues for use of swallowing compensatory strategies. Patient left supine in bed  with all needs within reach. Continue with current plan of care.   Function:  Eating Eating   Modified Consistency Diet: Yes Eating Assist Level: Helper scoops food on utensil;Supervision or verbal cues;More than reasonable amount of time;Set up assist for;Help managing cup/glass;Helper feeds patient;Help with picking up utensils;Helper brings food to mouth;Hand over hand assist   Eating Set Up Assist For: Applying device (includes dentures);Opening containers Helper Scoops Food on Utensil: Occasionally Soil scientist to Mouth: Occasionally   Cognition Comprehension Comprehension assist level: Follows complex conversation/direction with no assist  Expression   Expression assist level: Expresses basic 90% of the time/requires cueing < 10% of the time.  Social Interaction Social Interaction assist level: Interacts appropriately with others with medication or extra time (anti-anxiety, antidepressant).  Problem Solving Problem solving assist level: Solves complex problems: With extra time  Memory Memory assist level: Recognizes or recalls 90% of the time/requires cueing < 10% of the time    Pain No reports of pain   Therapy/Group: Individual Therapy  Talena Neira 11/02/2015, 4:15 PM

## 2015-11-02 NOTE — Progress Notes (Signed)
NIF - 30 VC 2.0L  Good effort

## 2015-11-02 NOTE — Progress Notes (Signed)
Physical Therapy Session Note  Patient Details  Name: Cynthia [name] MRN: [id number] Date of Birth: Nov 18, 1935  Today's Date: 11/02/2015 PT Individual Time: 1000-1045 PT Individual Time Calculation (min): 45 min   Short Term Goals: Week 1:  PT Short Term Goal 1 (Week 1): Patient will maintain sitting balance with max A x 3 minutes.  PT Short Term Goal 2 (Week 1): Patient will initiate transfers bed <> wheelchair.  PT Short Term Goal 3 (Week 1): Patient will tolerate OOB x 30 minutes.  PT Short Term Goal 4 (Week 1): Patient will perform rolling with hospital bed functions with max A.   Skilled Therapeutic Interventions/Progress Updates:    Patient in supine for exercises noted below after discussion about her pain exacerbation this date with tingling/burning in LE's and UE's.  Performed rolling to L mod A for lower body; side to sit total A for moving legs off bed and lifting trunk.  Scooted to EOB max A and encouraged to stay despite pt c/o severe pain in feet and hands.  Able to work on balance maybe 2-3 minutes as pt c/o increased pain in hands when trying to hold herself up.  Able to balance over COG briefly with minguard A overall requiring mod A for sitting balance.  Patient returned to supine and assist to place lift pad and lifted to recliner positioned with pillows and call bell close.  RN and nurse tech aware pt in chair and will call to return to supine when ready.  Therapy Documentation Precautions:  Precautions Precautions: Fall Precaution Comments: quadriparesis, move BLE very carefully due to pain Restrictions Weight Bearing Restrictions: No Pain: Pain Assessment Pain Assessment: 0-10 Pain Score: 8  Faces Pain Scale: Hurts whole lot Pain Type: Neuropathic pain Pain Location: Leg Pain Orientation: Left;Right Pain Descriptors / Indicators: Burning Pain Frequency: Intermittent Pain Onset: With Activity Pain Intervention(s): Repositioned Exercises: General  Exercises - Lower Extremity Heel Slides: AAROM;Both;10 reps;Supine (with resistance to extension) Hip ABduction/ADduction: AAROM;Both;10 reps;Supine Other Exercises Other Exercises: trunk rotation x 10 reps AAROM Other Exercises: bridging w/A x 5    See Function Navigator for Current Functional Status.   Therapy/Group: Individual Therapy  [place], [place] 11/02/2015  11/02/2015, 12:35 PM

## 2015-11-02 NOTE — Plan of Care (Signed)
Problem: RH PAIN MANAGEMENT Goal: RH STG PAIN MANAGED AT OR BELOW PT'S PAIN GOAL 3 or less  Outcome: Not Progressing Patient rates pain above 3. Patient complains of discomfort with tingling and stiffness. Patient given Tylenol at HS. adm

## 2015-11-02 NOTE — Progress Notes (Addendum)
80 y.o. female with metastatic colon cancer s/p surgery and chemotherapy, recurrent C-diff, HTN, stroke with lleft sided weakness/numbness, DVT who was admitted on 10/06/15 with complaints of dizziness, paraesthesias of right arm and right leg, numbness of hands and feet, BLE weakness and difficulty with walking. She underwent thermal ablation of liver metastatic lesion on 09/25/15 and has been having low grade fevers PTA. MRI brain negative for stroke.  She developed hypotension with hypotension and large amount of coffee ground emesis on 02/24 and Gi consulted for input. Dr. Paulita Fujita felt symptoms due to esophagitis/gastritis from plavix and recommended serial H/H and hydration.   She was intubated 02/26- 03/05 due to respiratory insufficiency and was treated for HCAP with Vanc/Zosyn. MRI Cervical/thoracic/lumbar spine showed severe neural foraminal stenosis R-C4, R-C5, B-C6 and L-C7 nerve roots and peripheral enhancement of distal thoracic spinal cord is well nerve roots with anterior and posterior suggestive of GBS. Due to areflexia of BLE and hypoactive UE reflexes she was treated with IVIG X 5 doses. She has had visual and tactile hallucinations felt to be due to opiates. She underwent EGD 03/1 showing question of proximal linear ulcer question from NGT and she was cleared to resume blood thinners.   BUE strength has been improving and respiratory status stable. FEES 03/6 Showed severe dysphagia and patient kept NPO with tube feeds for nutritional support. Repeat MBS done 03/10   Subjective/Complaints: Woke with feet and hands quite uncomfortable again. Slept 'ok' last night.   ROS: Pt denies fever, rash/itching, headache, blurred or double vision, nausea, vomiting, abdominal pain, diarrhea, chest pain, shortness of breath, palpitations, dysuria, dizziness, neck or back pain, bleeding, anxiety, or depression  Objective: Vital Signs: Blood pressure 132/59, pulse 78, temperature 97.8 F  (36.6 C), temperature source Oral, resp. rate 17, height 5\' 2"  (1.575 m), weight 62.3 kg (137 lb 5.6 oz), SpO2 97 %. No results found. No results found for this or any previous visit (from the past 72 hour(s)).   HEENT: normal Cardio: RRR Resp: CTA B/L GI: BS positive and NT, ND Extremity:  No Edema Skin:   Intact Neuro: Alert/Oriented, Abnormal Sensory absent LT and Proprio in BLE, and Abnormal Motor 3+ LUE, 3/5 RUE, 2- B hip ext KE tr-1, ADF/APF trace. Sensation decreased in stocking glove distribution in both legs. Has little sense of gross touch in either foot. Hands/UE's with similar pattern but much less affected.  Musc/Skel:  Full ROM both ue and lower ext. No edema Gen Fatigued but NAD Psych: appropriate  Assessment/Plan: 1. 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 Physiatrist is providing close team supervision and 24 hour management of active medical problems listed below. Physiatrist and rehab team continue to assess barriers to discharge/monitor patient progress toward functional and medical goals. FIM: Function - Bathing Bathing activity did not occur: Safety/medical concerns Position: Bed Body parts bathed by patient: Chest Body parts bathed by helper: Right arm, Left arm, Abdomen, Front perineal area, Buttocks, Left upper leg, Right upper leg, Right lower leg, Left lower leg Bathing not applicable: Back Assist Level:  (total A)  Function- Upper Body Dressing/Undressing What is the patient wearing?: Pull over shirt/dress Pull over shirt/dress - Perfomed by patient: Thread/unthread right sleeve, Thread/unthread left sleeve Pull over shirt/dress - Perfomed by helper: Put head through opening, Pull shirt over trunk Assist Level: 2 helpers Function - Lower Body Dressing/Undressing What is the patient wearing?: Underwear,  Pants, AFO, Ted Hose Position:  Bed Underwear - Performed by helper: Thread/unthread right underwear leg, Thread/unthread left underwear leg, Pull underwear up/down Pants- Performed by helper: Thread/unthread right pants leg, Thread/unthread left pants leg, Pull pants up/down Non-skid slipper socks- Performed by helper: Don/doff right sock, Don/doff left sock AFO - Performed by helper: Don/doff right AFO, Don/doff left AFO TED Hose - Performed by helper: Don/doff right TED hose, Don/doff left TED hose Assist for footwear: Dependant Assist for lower body dressing: 2 Helpers  Function - Toileting Toileting activity did not occur: No continent bowel/bladder event  Function - Air cabin crew transfer activity did not occur: Safety/medical concerns  Function - Chair/bed transfer Chair/bed transfer activity did not occur: Safety/medical concerns (unable to tolerate sitting upright EOB ) Chair/bed transfer method: Lateral scoot Chair/bed transfer assist level: 2 helpers Chair/bed transfer assistive device: Sliding board Chair/bed transfer details: Manual facilitation for weight shifting, Manual facilitation for placement, Manual facilitation for weight bearing, Verbal cues for sequencing, Verbal cues for technique, Verbal cues for precautions/safety, Verbal cues for safe use of DME/AE  Function - Locomotion: Wheelchair Will patient use wheelchair at discharge?: Yes Wheelchair activity did not occur: Safety/medical concerns (Too fatigued once up in [place] ) Function - Locomotion: Ambulation Ambulation activity did not occur: Safety/medical concerns  Function - Comprehension Comprehension: Auditory Comprehension assist level: Follows complex conversation/direction with no assist  Function - Expression Expression: Verbal Expression assist level: Expresses basic 90% of the time/requires cueing < 10% of the time.  Function - Social Interaction Social Interaction assist level: Interacts appropriately with others with  medication or extra time (anti-anxiety, antidepressant).  Function - Problem Solving Problem solving assist level: Solves complex problems: With extra time  Function - Memory Memory assist level: Recognizes or recalls 90% of the time/requires cueing < 10% of the time Patient normally able to recall (first 3 days only): Current season, Location of own room, Staff names and faces, That he or she is in a hospital  Medical Problem List and Plan: 1.  Quadriplegia, numbness, gait instability secondary to GBS  -continue CIR. 2.  DVT Prophylaxis/Anticoagulation: Pharmaceutical: Lovenox  -dopplers with LLE peroneal/gastroc dvt---no change in plan at present  -re-check doppler prior to discharge to determine future course 3. Pain Management: Tylenol prn. Limit narcotics due to cognitive effects     -gabapentin helpful. Increased to 200mg  bid and 300mg  qhs to capture night and early morning pain better  -continue TEDS also as these proved beneficial as well 4. Mood: LCSW to follow for evaluation and support.   5. Neuropsych: This patient is not fully capable of making decisions on her own behalf. 6. Skin/Wound Care: Routine pressure relief measures.   7. Fluids/Electrolytes/Nutrition: Monitor I/O.   -need to watch sodium levels--135 on friday  -encourage PO.   8.  Citrobacter Koseri UTI: treated with ctx  -voiding trial, I/O cath prn.---volumes 100-300cc 9. Colon cancer with liver mets: No complaints of pain.    10. UGIB with ABLA: H/H stable and anemia resolving. Continue to monitor for any recurrent signs of bleeding.   Hgb 10.7, trending up--should help with bp/orthostasis  -recheck this week  -encourage fluids and ongoing acclimation.   LOS (Days) 6 A FACE TO FACE EVALUATION WAS PERFORMED  SWARTZ,ZACHARY T 11/02/2015, 9:43 AM

## 2015-11-03 ENCOUNTER — Inpatient Hospital Stay (HOSPITAL_COMMUNITY): Payer: Medicare Other

## 2015-11-03 ENCOUNTER — Inpatient Hospital Stay (HOSPITAL_COMMUNITY): Payer: Medicare Other | Admitting: Physical Therapy

## 2015-11-03 ENCOUNTER — Inpatient Hospital Stay (HOSPITAL_COMMUNITY): Payer: Medicare Other | Admitting: Speech Pathology

## 2015-11-03 ENCOUNTER — Inpatient Hospital Stay (HOSPITAL_COMMUNITY): Payer: Self-pay | Admitting: Physical Therapy

## 2015-11-03 MED ORDER — BETHANECHOL CHLORIDE 10 MG PO TABS
10.0000 mg | ORAL_TABLET | Freq: Three times a day (TID) | ORAL | Status: DC
  Administered 2015-11-03 – 2015-11-05 (×7): 10 mg via ORAL
  Filled 2015-11-03 (×7): qty 1

## 2015-11-03 NOTE — Progress Notes (Signed)
Patient performed NIF -44 cmH2O, and VC 2.0 L/M with good effort.

## 2015-11-03 NOTE — Progress Notes (Signed)
Patient performed NIF and FVC with good effort. Results: NIF -55 FVC 1.7L

## 2015-11-03 NOTE — Progress Notes (Signed)
Speech Language Pathology Daily Session Note  Patient Details  Name: Cynthia [name] MRN: [id number] Date of Birth: 07/08/1936  Today's Date: 11/03/2015 SLP Individual Time: 0900-0930 SLP Individual Time Calculation (min): 30 min  Short Term Goals: Week 1: SLP Short Term Goal 1 (Week 1): Patient will consume current diet with minimal overt s/s of aspiration with supervision verbal cues for use of swallowing compenstory strategies.  SLP Short Term Goal 2 (Week 1): Patient will demonstrate sustained attention to functional task for 5 minutes with Min A verbal cues for redirection.  SLP Short Term Goal 2 - Progress (Week 1): Met SLP Short Term Goal 3 (Week 1): Patient will recall at least 1 event from previous therapy sessions with Min A question and verbal cues.  SLP Short Term Goal 3 - Progress (Week 1): Met SLP Short Term Goal 4 (Week 1): Patient will demonstrate efficient mastication with complete oral clearance with a self-percieved effort level of 5 or less in regards to fatigue with regular textures with supervision verbal cues  over 2 consecutive sesions prior to upgrade.   Skilled Therapeutic Interventions: Skilled treatment session focused on dysphagia goals. SLP facilitated session by providing trials of regular textures. Patient demonstrated efficient mastication with complete oral clearance without overt s/s of aspiration but appeared fatigued after several trials. Recommend patient continue current diet of Dys. 3 textures for energy conservation but may have snacks of regular textures with full supervision for use of swallow strategies. Patient left supine in bed with all needs within reach. Continue with current plan of care.    Function:  Eating Eating   Modified Consistency Diet: Yes Eating Assist Level: Helper feeds patient (with trials from SLP )           Cognition Comprehension Comprehension assist level: Follows complex conversation/direction with no assist   Expression   Expression assist level: Expresses basic 90% of the time/requires cueing < 10% of the time.  Social Interaction Social Interaction assist level: Interacts appropriately with others with medication or extra time (anti-anxiety, antidepressant).  Problem Solving Problem solving assist level: Solves complex problems: With extra time  Memory Memory assist level: Recognizes or recalls 90% of the time/requires cueing < 10% of the time    Pain Pain in bottom, patient repositioned and premedicated     Therapy/Group: Individual Therapy  Milissa Fesperman 11/03/2015, 2:35 PM

## 2015-11-03 NOTE — Progress Notes (Signed)
Occupational Therapy Note  Patient Details  Name: Cynthia [name] MRN: [id number] Date of Birth: [birth year]  Today's Date: 11/03/2015 OT Individual Time: 1300-1330 OT Individual Time Calculation (min): 30 min   Pt denied pain this afternoon. Individual Therapy  Pt resting in recliner upon arrival.  Pt requested to return to bed via max lift.  Pt engaged in bed mobility activities including rolling to right and left and maintaining position in midline.  Pt required mod A for rolling in bed with use of bed rails.   [name] [facility] 11/03/2015, 1:35 PM

## 2015-11-03 NOTE — Progress Notes (Signed)
80 y.o. female with metastatic colon cancer s/p surgery and chemotherapy, recurrent C-diff, HTN, stroke with lleft sided weakness/numbness, DVT who was admitted on 10/06/15 with complaints of dizziness, paraesthesias of right arm and right leg, numbness of hands and feet, BLE weakness and difficulty with walking. She underwent thermal ablation of liver metastatic lesion on 09/25/15 and has been having low grade fevers PTA. MRI brain negative for stroke.  She developed hypotension with hypotension and large amount of coffee ground emesis on 02/24 and Gi consulted for input. Dr. Paulita Fujita felt symptoms due to esophagitis/gastritis from plavix and recommended serial H/H and hydration.   She was intubated 02/26- 03/05 due to respiratory insufficiency and was treated for HCAP with Vanc/Zosyn. MRI Cervical/thoracic/lumbar spine showed severe neural foraminal stenosis R-C4, R-C5, B-C6 and L-C7 nerve roots and peripheral enhancement of distal thoracic spinal cord is well nerve roots with anterior and posterior suggestive of GBS. Due to areflexia of BLE and hypoactive UE reflexes she was treated with IVIG X 5 doses. She has had visual and tactile hallucinations felt to be due to opiates. She underwent EGD 03/1 showing question of proximal linear ulcer question from NGT and she was cleared to resume blood thinners.   BUE strength has been improving and respiratory status stable. FEES 03/6 Showed severe dysphagia and patient kept NPO with tube feeds for nutritional support. Repeat MBS done 03/10   Subjective/Complaints: Feet and hands are still "cold" . Able to sleep lat night  ROS: Pt denies fever, rash/itching, headache, blurred or double vision, nausea, vomiting, abdominal pain, diarrhea, chest pain, shortness of breath, palpitations, dysuria, dizziness, neck or back pain, bleeding, anxiety, or depression  Objective: Vital Signs: Blood pressure 119/68, pulse 70, temperature 97.6 F (36.4 C), temperature  source Oral, resp. rate 18, height 5\' 2"  (1.575 m), weight 62.1 kg (136 lb 14.5 oz), SpO2 98 %. No results found. No results found for this or any previous visit (from the past 72 hour(s)).   HEENT: normal Cardio: RRR Resp: CTA B/L GI: BS positive and NT, ND Extremity:  No Edema Skin:   Intact Neuro: Alert/Oriented, Abnormal Sensory absent LT and Proprio in BLE below knees, and Abnormal Motor 3+ LUE, 3/5 RUE, 2- B hip ext KE tr-1, ADF/APF trace. Sensation decreased in stocking glove distribution in both legs. Has little sense of gross touch in either foot. Hands/UE's with similar pattern but much less affected----no substantial changes today.  Musc/Skel:  Full ROM both ue and lower ext. No edema Gen Fatigued but NAD Psych: appropriate  Assessment/Plan: 1. 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 Physiatrist is providing close team supervision and 24 hour management of active medical problems listed below. Physiatrist and rehab team continue to assess barriers to discharge/monitor patient progress toward functional and medical goals. FIM: Function - Bathing Bathing activity did not occur: Safety/medical concerns Position: Bed Body parts bathed by patient: Chest Body parts bathed by helper: Right arm, Left arm, Abdomen, Front perineal area, Buttocks, Left upper leg, Right upper leg, Right lower leg, Left lower leg Bathing not applicable: Back Assist Level:  (total A)  Function- Upper Body Dressing/Undressing What is the patient wearing?: Pull over shirt/dress Pull over shirt/dress - Perfomed by patient: Thread/unthread right sleeve, Thread/unthread left sleeve Pull over shirt/dress - Perfomed by helper: Put head through opening, Pull shirt over trunk Assist Level: 2 helpers Function - Lower Body Dressing/Undressing What is  the patient wearing?: Underwear, Pants, AFO,  Ted Hose Position: Bed Underwear - Performed by helper: Thread/unthread right underwear leg, Thread/unthread left underwear leg, Pull underwear up/down Pants- Performed by helper: Thread/unthread right pants leg, Thread/unthread left pants leg, Pull pants up/down Non-skid slipper socks- Performed by helper: Don/doff right sock, Don/doff left sock AFO - Performed by helper: Don/doff right AFO, Don/doff left AFO TED Hose - Performed by helper: Don/doff right TED hose, Don/doff left TED hose Assist for footwear: Dependant Assist for lower body dressing: 2 Helpers  Function - Toileting Toileting activity did not occur: No continent bowel/bladder event  Function - Air cabin crew transfer activity did not occur: Safety/medical concerns  Function - Chair/bed transfer Chair/bed transfer activity did not occur: Safety/medical concerns (unable to tolerate sitting upright EOB ) Chair/bed transfer method: Lateral scoot Chair/bed transfer assist level: dependent (Pt equals 0%) Chair/bed transfer assistive device: Mechanical lift Chair/bed transfer details: Manual facilitation for weight shifting, Manual facilitation for placement, Manual facilitation for weight bearing, Verbal cues for sequencing, Verbal cues for technique, Verbal cues for precautions/safety, Verbal cues for safe use of DME/AE  Function - Locomotion: Wheelchair Will patient use wheelchair at discharge?: Yes Wheelchair activity did not occur: Safety/medical concerns (Too fatigued once up in [facility] ) Function - Locomotion: Ambulation Ambulation activity did not occur: Safety/medical concerns  Function - Comprehension Comprehension: Auditory Comprehension assist level: Follows complex conversation/direction with no assist  Function - Expression Expression: Verbal Expression assist level: Expresses complex ideas: With no assist  Function - Social Interaction Social Interaction assist level: Interacts appropriately with  others - No medications needed.  Function - Problem Solving Problem solving assist level: Solves complex problems: Recognizes & self-corrects  Function - Memory Memory assist level: Complete Independence: No helper Patient normally able to recall (first 3 days only): Current season  Medical Problem List and Plan: 1.  Quadriplegia, numbness, gait instability secondary to GBS  -continue CIR---team conference today  -pt frustrated by slow progress- tried to provide positive reinforcement and motivation 2.  DVT Prophylaxis/Anticoagulation: Pharmaceutical: Lovenox  -dopplers with LLE peroneal/gastroc dvt---no change in plan at present  -re-check doppler prior to discharge to determine future course 3. Pain Management: Tylenol prn. Limit narcotics due to cognitive effects     -gabapentin helpful. Increased to 200mg  bid and 300mg  qhs to capture night and early morning pain better  -continue TEDS also as these proved beneficial as well 4. Mood: LCSW to follow for evaluation and support.   5. Neuropsych: This patient is not fully capable of making decisions on her own behalf. 6. Skin/Wound Care: Routine pressure relief measures.   7. Fluids/Electrolytes/Nutrition: Monitor I/O.   -need to watch sodium levels--135 on friday  -encourage PO.   8.  Citrobacter Koseri UTI: treated with ctx  -voiding trial, I/O cath prn.---volumes 100-300cc  -  urecholine trial 9. Colon cancer with liver mets: No complaints of pain.    10. UGIB with ABLA: H/H stable and anemia resolving. Continue to monitor for any recurrent signs of bleeding.   Hgb 10.7, trending up--should help with bp/orthostasis  -recheck this week  -encourage fluids and ongoing acclimation.   LOS (Days) 7 A FACE TO FACE EVALUATION WAS PERFORMED  Atiana Levier T 11/03/2015, 8:48 AM

## 2015-11-03 NOTE — Progress Notes (Signed)
Physical Therapy Session Note  Patient Details  Name: Cynthia [name] MRN: [id number] Date of Birth: Dec 27, 1935  Today's Date: 11/03/2015 PT Individual Time: 1449-1515 PT Individual Time Calculation (min): 26 min   Short Term Goals: Week 1:  PT Short Term Goal 1 (Week 1): Patient will maintain sitting balance with max A x 3 minutes.  PT Short Term Goal 2 (Week 1): Patient will initiate transfers bed <> wheelchair.  PT Short Term Goal 3 (Week 1): Patient will tolerate OOB x 30 minutes.  PT Short Term Goal 4 (Week 1): Patient will perform rolling with hospital bed functions with max A.   Skilled Therapeutic Interventions/Progress Updates:    Pt received sleeping in bed, easily awakes to voice, reports no pain but fatigue and agreeable to bed level session. PT instructs pt in BLE therex 2x20 ankle pumps AAROM with pt initiating movement, x20 quad sets with cues for 2-3 second hold, and x15 SAQ.  PT educated pt on positioning in bed with pillows between knees and ankles for increased comfort.  Pt roll to L side with mod assist for LEs and verbal cues for maintaining grip on bedrail.  Pt positioned in L semi-side lying with pillows for back and pillows between thighs and lower legs for comfort.  Call bell in reach and needs met.   Therapy Documentation Precautions:  Precautions Precautions: Fall Precaution Comments: quadriparesis, move BLE very carefully due to pain Restrictions Weight Bearing Restrictions: No General:   Vital Signs: Therapy Vitals Temp: 97.8 F (36.6 C) Temp Source: Oral Pulse Rate: 85 Resp: 16 BP: 125/70 mmHg Patient Position (if appropriate): Lying Oxygen Therapy SpO2: 99 % O2 Device: Not Delivered Pain:   Mobility:   Locomotion :    Trunk/Postural Assessment :    Balance:   Exercises:   Other Treatments:     See Function Navigator for Current Functional Status.   Therapy/Group: Individual Therapy  Earnest Conroy Penven[place]Crew 11/03/2015, 4:30 PM

## 2015-11-03 NOTE — Progress Notes (Signed)
Physical Therapy Session Note  Patient Details  Name: Cynthia [name] MRN: [id number] Date of Birth: 1936/04/21  Today's Date: 11/03/2015 PT Individual Time: 1000-1030 and 1545-1615  PT Individual Time Calculation (min): 30 min and 30 min   Short Term Goals: Week 1:  PT Short Term Goal 1 (Week 1): Patient will maintain sitting balance with max A x 3 minutes.  PT Short Term Goal 2 (Week 1): Patient will initiate transfers bed <> wheelchair.  PT Short Term Goal 3 (Week 1): Patient will tolerate OOB x 30 minutes.  PT Short Term Goal 4 (Week 1): Patient will perform rolling with hospital bed functions with max A.   Skilled Therapeutic Interventions/Progress Updates:   Treatment 1: Session focused on bed mobility and OOB sitting tolerance. Patient performed rolling to R and L using rails with mod A and assist to bend LE's multiple times for LB dressing with total A and placement of Maximove sling. Patient transferred bed > recliner with BLE elevated via Maximove with +2A and pillows positioned at L trunk and under BUE to assist with midline positioning. Patient worked on AROM BLE hip flex/ext, knee flex/ext, and no active ankle DF/PF noted in recliner. Patient very tearful regarding slow progress and dependency at this time, provided education regarding progress to date and PT goals and purpose, patient verbalized understanding and appeared to brighten at this. Patient agreeable to stay up in recliner until OT session 45 min later. Patient left sitting in recliner with soft call bell in reach.   Treatment 2: Patient in bed, daughter departing at beginning of session. Patient reporting fatigue after just taking medications, provided short rest break at beginning of session. Patient required mod encouragement but eventually agreeable to work on sitting balance EOB. Performed rolling to L with mod A and cues for hand placement, total A to transfer sidelying > sit EOB with HOB raised. Patient fearful of  falling and required max cues to keep eyes open and shift weight on to RUE due to tendency to lean left as well as max multimodal cues for safe hand placement to use BUE functionally to support balance. Patient required mod-total A for static sitting balance x 10 minutes. Patient stated this was her limit, returned to supine with total A and repositioned in bed with +2A. Patient left semi reclined in bed with soft call bell in reach.   Therapy Documentation Precautions:  Precautions Precautions: Fall Precaution Comments: quadriparesis, move BLE very carefully due to pain Restrictions Weight Bearing Restrictions: No Pain:  Unrated burning/tingling in BUE and BLE  See Function Navigator for Current Functional Status.   Therapy/Group: Individual Therapy  Laretta Alstrom 11/03/2015, 10:46 AM

## 2015-11-03 NOTE — Progress Notes (Signed)
Occupational Therapy Session Note  Patient Details  Name: Cynthia [name] MRN: [id number] Date of Birth: 10-24-1935  Today's Date: 11/03/2015 OT Individual Time: 1115-1200 OT Individual Time Calculation (min): 45 min    Short Term Goals: Week 1:  OT Short Term Goal 1 (Week 1): Pt will tolerate 7 minutes of functional task with no more than 2 rest breaks in order to increase endurance for functional tasks.  OT Short Term Goal 2 (Week 1): Pt will perform UB bathing  in supported position with min A in order to increase I with self care.  OT Short Term Goal 3 (Week 1): Pt will perform toilet transfer with assistance of 1 person in order to decrease level of assist for functional transfers.  OT Short Term Goal 4 (Week 1): Pt willl tolerate sitting wheelchair for functional tasks at sink for 20 minutes or greater in order to increase activity tolerance.   Skilled Therapeutic Interventions/Progress Updates:    Pt resting in recliner upon arrival.  Pt engaged in Marshall gross and fine motor activities with focus on controlled movement to complete functional tasks.  Pt combed hair with hand over hand assist.  Pt engaged in grasping small foam objects with emphasis on controlled movements.  Pt requested to place in small container and place lid on container.  Pt also engaged in demonstrating BUE exercises.  Pt was able to bring herself to midline on request.  Focus on increased functional use of BUE to increase independence with BADLs and reduce burden of care.  Therapy Documentation Precautions:  Precautions Precautions: Fall Precaution Comments: quadriparesis, move BLE very carefully due to pain Restrictions Weight Bearing Restrictions: No   Pain:  Pt c/o tingling in bilateral feet and hands with occasional shooting pain; repositoned  See Function Navigator for Current Functional Status.   Therapy/Group: Individual Therapy  Leroy Libman 11/03/2015, 1:34 PM

## 2015-11-04 ENCOUNTER — Inpatient Hospital Stay (HOSPITAL_COMMUNITY): Payer: Medicare Other | Admitting: Speech Pathology

## 2015-11-04 ENCOUNTER — Inpatient Hospital Stay (HOSPITAL_COMMUNITY): Payer: Medicare Other | Admitting: Physical Therapy

## 2015-11-04 ENCOUNTER — Inpatient Hospital Stay (HOSPITAL_COMMUNITY): Payer: Self-pay | Admitting: Occupational Therapy

## 2015-11-04 ENCOUNTER — Inpatient Hospital Stay (HOSPITAL_COMMUNITY): Payer: Self-pay

## 2015-11-04 NOTE — Progress Notes (Signed)
Speech Language Pathology Weekly Progress and Session Note  Patient Details  Name: Cynthia [name] MRN: [id number] Date of Birth: April 08, 1936  Beginning of progress report period: October 28, 2015 End of progress report period: November 04, 2015  Today's Date: 11/04/2015 SLP Individual Time: 7121-9758 SLP Individual Time Calculation (min): 57 min  Short Term Goals: Week 1: SLP Short Term Goal 1 (Week 1): Patient will consume current diet with minimal overt s/s of aspiration with supervision verbal cues for use of swallowing compenstory strategies.  SLP Short Term Goal 1 - Progress (Week 1): Progressing toward goal SLP Short Term Goal 2 (Week 1): Patient will demonstrate sustained attention to functional task for 5 minutes with Min A verbal cues for redirection.  SLP Short Term Goal 2 - Progress (Week 1): Met SLP Short Term Goal 3 (Week 1): Patient will recall at least 1 event from previous therapy sessions with Min A question and verbal cues.  SLP Short Term Goal 3 - Progress (Week 1): Met SLP Short Term Goal 4 (Week 1): Patient will demonstrate efficient mastication with complete oral clearance with a self-percieved effort level of 5 or less in regards to fatigue with regular textures with supervision verbal cues  over 2 consecutive sesions prior to upgrade.  SLP Short Term Goal 4 - Progress (Week 1): Progressing toward goal    New Short Term Goals: Week 2: SLP Short Term Goal 1 (Week 2): Patient will consume current diet with minimal overt s/s of aspiration with supervision verbal cues for use of swallowing compenstory strategies.  SLP Short Term Goal 2 (Week 2): Patient will demonstrate efficient mastication with complete oral clearance with a self-percieved effort level of 5 or less in regards to fatigue with regular textures with supervision verbal cues  over 2 consecutive sesions prior to upgrade.   Weekly Progress Updates:  Pt has met 2/4 short-term goals and is progressing well with  dysphagia goals. Pt is tolerating a Dysphagia 3 diet with thin liquids with intermittent verbal cueing and full supervision to encourage compliance with swallow precautions.    Intensity: Minumum of 1-2 x/day, 30 to 90 minutes Frequency: 1 to 3 out of 7 days Duration/Length of Stay: 24-27 days  Treatment/Interventions: Dysphagia/aspiration precaution training;Patient/family education   Daily Session  Skilled Therapeutic Interventions: Pt trialed with a regular consistency snack. Pt required intermittent cueing to facilitate consistent use of chin tuck with solids and liquids. No overt s/s aspiration noted. Pt fatigued quickly with mastication of solid and required a rest break prior to consuming the remainder of the small snack.     Function:   Eating Eating   Modified Consistency Diet: Yes Eating Assist Level: Set up assist for;Supervision or verbal cues   Eating Set Up Assist For: Opening containers Helper Scoops Food on Utensil: Occasionally     Cognition Comprehension Comprehension assist level: Follows complex conversation/direction with no assist  Expression   Expression assist level: Expresses complex ideas: With extra time/assistive device  Social Interaction Social Interaction assist level: Interacts appropriately with others with medication or extra time (anti-anxiety, antidepressant).  Problem Solving Problem solving assist level: Solves complex problems: With extra time  Memory Memory assist level: Recognizes or recalls 90% of the time/requires cueing < 10% of the time   General    Pain Pain Assessment Pain Assessment: No/denies pain  Therapy/Group: Individual Therapy  Vinetta Bergamo 11/04/2015, 4:14 PM

## 2015-11-04 NOTE — Progress Notes (Signed)
Occupational Therapy Session Note  Patient Details  Name: Cynthia [name] MRN: [id number] Date of Birth: 1936/05/28  Today's Date: 11/04/2015 OT Individual Time: 1015-1100 OT Individual Time Calculation (min): 45 min    Short Term Goals: Week 1:  OT Short Term Goal 1 (Week 1): Pt will tolerate 7 minutes of functional task with no more than 2 rest breaks in order to increase endurance for functional tasks.  OT Short Term Goal 2 (Week 1): Pt will perform UB bathing  in supported position with min A in order to increase I with self care.  OT Short Term Goal 3 (Week 1): Pt will perform toilet transfer with assistance of 1 person in order to decrease level of assist for functional transfers.  OT Short Term Goal 4 (Week 1): Pt willl tolerate sitting wheelchair for functional tasks at sink for 20 minutes or greater in order to increase activity tolerance.   Skilled Therapeutic Interventions/Progress Updates:    Pt seen for OT session focusing on fine motor coordination. Pt in supine upon arrival with NT present finishing cathing. Total A LB dressing completed in supine with pt requiring mod-max A and VCs for technique for rolling. Pt voiced increased nerve pain in B LEs when therapist holding LE to thread pants, rest and repositioned following dressing task for comfort. Pt completed card game from supine level with daughter participating. With increased time, pt demonstrated ability to grasp and manipulate playing cards. She became easily frustrated with decreased sensation and coordination in B UEs. Provided with card holder to assist during game. Educated regarding taking tasks slowly and not becoming frustrated as pt able to complete task well when going slow.  Pt left in supine at end of session, encouraged her to get up for lunch this afternoon, pt voiced understanding. Left in supine with all needs in reach and daughter present.  Educated throughout session regarding activity progression and  purpose of therapy tasks.   Therapy Documentation Precautions:  Precautions Precautions: Fall Precaution Comments: quadriparesis, move BLE very carefully due to pain Restrictions Weight Bearing Restrictions: No  See Function Navigator for Current Functional Status.   Therapy/Group: Individual Therapy  Lewis, Oziel Beitler C 11/04/2015, 7:13 AM

## 2015-11-04 NOTE — Patient Care Conference (Signed)
Inpatient RehabilitationTeam Conference and Plan of Care Update Date: 11/03/2015   Time: 2:35 PM    Patient Name: Cynthia [name]      Medical Record Number: [id number]  Date of Birth: [birth year] Sex: Female         Room/Bed: 4W20C/4W20C-01 Payor Info: Payor: [employer] MEDICARE / Plan: [facility] MEDICARE / Product Type: *No Product type* /    Admitting Diagnosis: GBS  Admit Date/Time:  10/27/2015  3:35 PM Admission Comments: No comment available   Primary Diagnosis:  Quadriplegia and quadriparesis ([place]) Principal Problem: Quadriplegia and quadriparesis ([place])  Patient Active Problem List   Diagnosis Date Noted  . Quadriplegia and quadriparesis ([place])   . Numbness and tingling   . Paresthesias   . UTI (lower urinary tract infection)   . History of colon cancer   . Acute blood loss anemia   . Upper GI bleed   . GBS (Guillain-Barre syndrome) ([place])   . Aspiration into airway   . Endotracheally intubated   . Acute respiratory failure ([place])   . Shock ([place]) 10/09/2015  . Hemoptysis   . Transverse myelitis ([place])   . Peripheral neuropathy ([place]) 10/06/2015  . Paresthesia of both hands 10/06/2015  . Paresthesia of both feet 10/06/2015  . Cerebral embolism with cerebral infarction 10/06/2015  . Ataxia   . Colon carcinoma metastatic to liver ([place]) 09/25/2015  . Metastasis to liver ([place]) 08/28/2015  . Colon cancer metastasized to liver ([place])   . Liver lesion   . Cerebral infarction due to unspecified mechanism 01/29/2015  . B12 deficiency 01/29/2015  . Essential hypertension 01/29/2015  . Arm numbness left   . Disorientation   . Numbness and tingling of left side of face   . TIA (transient ischemic attack) 01/11/2015  . Confusion   . Left arm numbness   . DVT, lower extremity, distal ([place]) 10/27/2013  . T4a, N0 09/27/2013  . Iron deficiency anemia 09/04/2013  . Symptomatic anemia 09/03/2013  . Orthostasis 09/03/2013  . Near syncope 09/03/2013    Expected Discharge Date:  Expected Discharge Date: 11/24/15  Team Members Present: Physician leading conference: Dr. Alger Simons Social Worker Present: Lennart Pall, LCSW Nurse Present: Dorien Chihuahua, RN PT Present: Carney Living, PT OT Present: Willeen Cass, OT;Roanna Epley, COTA SLP Present: Weston Anna, SLP PPS Coordinator present : Daiva Nakayama, RN, CRRN     Current Status/Progress Goal Weekly Team Focus  Medical   gbs with tetraplegia and substantial sensory deficits and dysesthesias. pain has been impairing  improve pain control and activity tolernce  pain mgt, bladder function, ego support   Bowel/Bladder   Requiring I/O caths; low volumes overall.  Incontinent bowel, no awareness.  Continent bowel and bladder with timed toileting. Voiding without retention.  Continue I/O caths,time according to volumes; monitor need for bowel program.   Swallow/Nutrition/ Hydration   Dys. 3 textures with thin liquids for energy conservation, Supervision-Min A for use of swallow strateiges. Can have a snack of regular textures   Supervision  tolerance of current diet, trials of upgraded textures, increased use of swallow strategies    ADL's   bathing at night currently; UB dressing-mod A; LB dressing-tot A; functional transfers-tot A +2; sitting activities limited by pain; BADLs at bed level; max A for self feeding  mod A overall except UB dressing at supervision  activity tolerance, bed mobility, sitting balance, functional transfers, BADL retraining; family education   Mobility   mod-max A rolling with rails, total A sit <>  supine, transfers with total A x 2, mod A sitting balance, limited by pain and fatigue  min A sitting balance and bed mobility, mod A transfers   activity tolerance, BLE ROM, desensitization, bed mobility, transfers, sitting balance, pt education   Communication             Safety/Cognition/ Behavioral Observations            Pain   Back pain,generalized discomfort with tingling BUE's.  Pain  managed to goal, 2/10.  Monitor, reposition.   Skin   St.1 at sacrum, MASD buttocks. Heels red, no breakdown,foam and prafos. Rash to back,face.  No further breakdown, no falls, injury this admission.  Monitor, reposition. Pressure relief in W/C.    Rehab Goals Patient on target to meet rehab goals: Yes *See Care Plan and progress notes for long and short-term goals.  Barriers to Discharge: profound weakness, ongoing pain    Possible Resolutions to Barriers:  analgesic strategies    Discharge Planning/Teaching Needs:  Plan home with daughter as primary caregiver and able to provide 24/7 assistance along with help from daughter-in-law  Teaching still to be scheduled   Team Discussion:  Urinary retention/ sensory issues/ increased ability to sit up longer.  Encourage standing frame/ tilt table/ maxi move.  Some return in UE.  D3 still due to fatigue with reg diet/ chewing.  Goals set for mod assist w/c level.  Family very supportive.  Revisions to Treatment Plan:  None   Continued Need for Acute Rehabilitation Level of Care: The patient requires daily medical management by a physician with specialized training in physical medicine and rehabilitation for the following conditions: Daily direction of a multidisciplinary physical rehabilitation program to ensure safe treatment while eliciting the highest outcome that is of practical value to the patient.: Yes Daily medical management of patient stability for increased activity during participation in an intensive rehabilitation regime.: Yes Daily analysis of laboratory values and/or radiology reports with any subsequent need for medication adjustment of medical intervention for : Neurological problems;Post surgical problems  Jaxxon Naeem 11/04/2015, 3:47 PM

## 2015-11-04 NOTE — Progress Notes (Signed)
Pt performed NIF -48. FVC 1.4L. Pt had good effort.

## 2015-11-04 NOTE — Progress Notes (Signed)
Nutrition Follow-up  DOCUMENTATION CODES:   Severe malnutrition in context of acute illness/injury  INTERVENTION:  Discontinue Prostat.  Continue Boost Plus po TID, each supplement provides 360 kcal and 14 grams of protein.   Encourage adequate PO intake.  NUTRITION DIAGNOSIS:   Malnutrition related to acute illness as evidenced by percent weight loss, moderate depletions of muscle mass; ongoing  GOAL:   Patient will meet greater than or equal to 90% of their needs; progressing  MONITOR:   PO intake, Supplement acceptance, Weight trends, Labs, I & O's, Skin  REASON FOR ASSESSMENT:   Low Braden    ASSESSMENT:   80 y.o. female with metastatic colon cancer s/p surgery and chemotherapy, recurrent C-diff, HTN, stroke with lleft sided weakness/numbness, DVT who was admitted on 10/06/15 with complaints of dizziness, paraesthesias of right arm and right leg, numbness of hands and feet, BLE weakness and difficulty with walking. She was intubated 02/26- 03/05 due to respiratory insufficiency and was treated for HCAP with Vanc/Zosyn. MRI Cervical/thoracic/lumbar spine showed severe neural foraminal stenosis R-C4, R-C5, B-C6 and L-C7 nerve roots and peripheral enhancement of distal thoracic spinal cord is well nerve roots with anterior and posterior suggestive of GBS  Meal completion has been varied from 10-100%, however most intake has been 50-100%. Pt currently has Boost Plus ordered and has been consuming them. Pt additionally has Prostat ordered however has been refusing. RD to discontinue Prostat. Pt encouraged to eat her food at meals and to drink her supplements.   Diet Order:  DIET DYS 3 Room service appropriate?: Yes; Fluid consistency:: Thin  Skin:  Wound (see comment) (Stage I pressure ulcer on coccyx)  Last BM:  3/20  Height:   Ht Readings from Last 1 Encounters:  10/27/15 5\' 2"  (1.575 m)    Weight:   Wt Readings from Last 1 Encounters:  11/04/15 132 lb 0.9 oz  (59.9 kg)    Ideal Body Weight:  50 kg  BMI:  Body mass index is 24.15 kg/(m^2).  Estimated Nutritional Needs:   Kcal:  1600-1850  Protein:  80-90 grams  Fluid:  1.6 - 1.8 L/day  EDUCATION NEEDS:   No education needs identified at this time  Corrin Parker, MS, RD, LDN Pager # [phone] After hours/ weekend pager # [phone]

## 2015-11-04 NOTE — Progress Notes (Signed)
80 y.o. female with metastatic colon cancer s/p surgery and chemotherapy, recurrent C-diff, HTN, stroke with lleft sided weakness/numbness, DVT who was admitted on 10/06/15 with complaints of dizziness, paraesthesias of right arm and right leg, numbness of hands and feet, BLE weakness and difficulty with walking. She underwent thermal ablation of liver metastatic lesion on 09/25/15 and has been having low grade fevers PTA. MRI brain negative for stroke.  She developed hypotension with hypotension and large amount of coffee ground emesis on 02/24 and Gi consulted for input. Dr. Paulita Fujita felt symptoms due to esophagitis/gastritis from plavix and recommended serial H/H and hydration.   She was intubated 02/26- 03/05 due to respiratory insufficiency and was treated for HCAP with Vanc/Zosyn. MRI Cervical/thoracic/lumbar spine showed severe neural foraminal stenosis R-C4, R-C5, B-C6 and L-C7 nerve roots and peripheral enhancement of distal thoracic spinal cord is well nerve roots with anterior and posterior suggestive of GBS. Due to areflexia of BLE and hypoactive UE reflexes she was treated with IVIG X 5 doses. She has had visual and tactile hallucinations felt to be due to opiates. She underwent EGD 03/1 showing question of proximal linear ulcer question from NGT and she was cleared to resume blood thinners.   BUE strength has been improving and respiratory status stable. FEES 03/6 Showed severe dysphagia and patient kept NPO with tube feeds for nutritional support. Repeat MBS done 03/10   Subjective/Complaints: Feet and hands are still "cold" in the AM--seem to get better as day progresses  ROS: Pt denies fever, rash/itching, headache, blurred or double vision, nausea, vomiting, abdominal pain, diarrhea, chest pain, shortness of breath, palpitations, dysuria, dizziness, neck or back pain, bleeding, anxiety, or depression  Objective: Vital Signs: Blood pressure 139/65, pulse 70, temperature 97.9 F  (36.6 C), temperature source Oral, resp. rate 18, height 5\' 2"  (1.575 m), weight 59.9 kg (132 lb 0.9 oz), SpO2 97 %. No results found. No results found for this or any previous visit (from the past 72 hour(s)).   HEENT: normal Cardio: RRR Resp: CTA B/L GI: BS positive and NT, ND Extremity:  No Edema Skin:   Intact Neuro: Alert/Oriented, Abnormal Sensory absent LT and [place] in BLE below knees, and Abnormal Motor 3+ LUE, 3 to 3+/5 RUE, 2- B hip ext KE tr-1,  APF trace. Sensation decreased in stocking glove distribution in both legs. Has little sense of gross touch in either foot. Hands/UE's with similar pattern but much less affected----no substantial changes today.  Musc/Skel:  Full ROM both ue and lower ext. No edema Gen Fatigued but NAD Psych: appropriate  Assessment/Plan: 1. 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 Physiatrist is providing close team supervision and 24 hour management of active medical problems listed below. Physiatrist and rehab team continue to assess barriers to discharge/monitor patient progress toward functional and medical goals. FIM: Function - Bathing Bathing activity did not occur: Safety/medical concerns Position: Bed Body parts bathed by patient: Chest Body parts bathed by helper: Right arm, Left arm, Abdomen, Front perineal area, Buttocks, Left upper leg, Right upper leg, Right lower leg, Left lower leg Bathing not applicable: Back Assist Level:  (total A)  Function- Upper Body Dressing/Undressing What is the patient wearing?: Pull over shirt/dress Pull over shirt/dress - Perfomed by patient: Thread/unthread right sleeve, Thread/unthread left sleeve Pull over shirt/dress - Perfomed by helper: Put head through opening, Pull shirt over trunk Assist Level: 2 helpers Function -  Lower Body Dressing/Undressing What is the patient  wearing?: Underwear, Pants, AFO, Ted Hose Position: Bed Underwear - Performed by helper: Thread/unthread right underwear leg, Thread/unthread left underwear leg, Pull underwear up/down Pants- Performed by helper: Thread/unthread right pants leg, Thread/unthread left pants leg, Pull pants up/down Non-skid slipper socks- Performed by helper: Don/doff right sock, Don/doff left sock AFO - Performed by helper: Don/doff right AFO, Don/doff left AFO TED Hose - Performed by helper: Don/doff right TED hose, Don/doff left TED hose Assist for footwear: Dependant Assist for lower body dressing: 2 Helpers  Function - Toileting Toileting activity did not occur: No continent bowel/bladder event Toileting steps completed by patient: Adjust clothing prior to toileting, Performs perineal hygiene, Adjust clothing after toileting Toileting steps completed by helper: Adjust clothing prior to toileting, Performs perineal hygiene, Adjust clothing after toileting  Function - Air cabin crew transfer activity did not occur: Safety/medical concerns  Function - Chair/bed transfer Chair/bed transfer activity did not occur: Safety/medical concerns (unable to tolerate sitting upright EOB ) Chair/bed transfer method: Lateral scoot Chair/bed transfer assist level: 2 helpers Chair/bed transfer assistive device: Mechanical lift Chair/bed transfer details: Manual facilitation for weight shifting, Manual facilitation for placement, Manual facilitation for weight bearing, Verbal cues for sequencing, Verbal cues for technique, Verbal cues for precautions/safety, Verbal cues for safe use of DME/AE  Function - Locomotion: Wheelchair Will patient use wheelchair at discharge?: Yes Wheelchair activity did not occur: Safety/medical concerns (Too fatigued once up in [facility] ) Function - Locomotion: Ambulation Ambulation activity did not occur: Safety/medical concerns  Function - Comprehension Comprehension:  Auditory Comprehension assist level: Follows complex conversation/direction with no assist  Function - Expression Expression: Verbal Expression assist level: Expresses complex 90% of the time/cues < 10% of the time  Function - Social Interaction Social Interaction assist level: Interacts appropriately with others with medication or extra time (anti-anxiety, antidepressant).  Function - Problem Solving Problem solving assist level: Solves complex problems: With extra time  Function - Memory Memory assist level: Recognizes or recalls 90% of the time/requires cueing < 10% of the time Patient normally able to recall (first 3 days only): Current season  Medical Problem List and Plan: 1.  Quadriplegia, numbness, gait instability secondary to GBS  -continue CIR---team conference today  -pt frustrated by slow progress- tried to provide positive reinforcement and motivation 2.  DVT Prophylaxis/Anticoagulation: Pharmaceutical: Lovenox  -dopplers with LLE peroneal/gastroc dvt---no change in plan at present  -re-check doppler prior to discharge to determine future course--next week 3. Pain Management: Tylenol prn. Limit narcotics due to cognitive effects     -gabapentin helpful. Increased to 200mg  bid and 300mg  qhs to capture night and early morning pain better  -continue TEDS also as these proved beneficial as well 4. Mood: LCSW to follow for evaluation and support.   5. Neuropsych: This patient is not fully capable of making decisions on her own behalf. 6. Skin/Wound Care: Routine pressure relief measures.   7. Fluids/Electrolytes/Nutrition: Monitor I/O.   -need to watch sodium levels--135 on friday  -encourage PO.   8.  Citrobacter Koseri UTI: treated with ctx  -voiding trial, I/O cath prn.---volumes 100-300cc  -  urecholine trial initiated 9. Colon cancer with liver mets: No complaints of pain.    10. UGIB with ABLA: H/H stable and anemia resolving. Continue to monitor for any recurrent  signs of bleeding.   Hgb 10.7, trending up--should help with bp/orthostasis  -recheck tomorrow  -encourage fluids and ongoing acclimation.   LOS (Days) 8 A  FACE TO FACE EVALUATION WAS PERFORMED  SWARTZ,ZACHARY T 11/04/2015, 9:20 AM

## 2015-11-04 NOTE — Progress Notes (Signed)
Occupational Therapy Weekly Progress Note  Patient Details  Name: Cynthia [name] MRN: [id number] Date of Birth: 07-Jan-1936  Beginning of progress report period: October 28, 2015 End of progress report period: November 04, 2015  Patient has met 2 of 4 short term goals.  Pt has made steady but slow progress with BADLs since admission.  Pt is currently receiving night baths from nursing.  Pt has exhibited increased ability to use her BUE in dressing and grooming tasks, most notably threading her arms into shirt sleeves and brushing her teeth.  Pt continues to c/o increased tingling/pain in all her extremities but continues to actively participate in therapy.  Pt is mod A for rolling left and right in bed using bed rails.  Pt is able to readjust her UB when she begins to lean to her right or left but requires verbal cues to initiate the task.  Pt fatigues quickly and requires rest breaks during therapy session.  Pt requires hand over hand assist with self feeding tasks.  Patient continues to demonstrate the following deficits: abnormal posture, acute pain, cognitive deficits, muscle weakness (generalized) and impaired sensation, coordination deficit, quadriplegia and therefore will continue to benefit from skilled OT intervention to enhance overall performance with BADL and Reduce care partner burden.  Patient progressing toward long term goals..  Continue plan of care.  OT Short Term Goals Week 1:  OT Short Term Goal 1 (Week 1): Pt will tolerate 7 minutes of functional task with no more than 2 rest breaks in order to increase endurance for functional tasks.  OT Short Term Goal 1 - Progress (Week 1): Met OT Short Term Goal 2 (Week 1): Pt will perform UB bathing  in supported position with min A in order to increase I with self care.  OT Short Term Goal 2 - Progress (Week 1): Discontinued (comment) (Pt receiving nursing night baths at present) OT Short Term Goal 3 (Week 1): Pt will perform toilet  transfer with assistance of 1 person in order to decrease level of assist for functional transfers.  OT Short Term Goal 3 - Progress (Week 1): Progressing toward goal OT Short Term Goal 4 (Week 1): Pt willl tolerate sitting wheelchair for functional tasks at sink for 20 minutes or greater in order to increase activity tolerance.  OT Short Term Goal 4 - Progress (Week 1): Met Week 2:  OT Short Term Goal 1 (Week 2): Pt will perform toilet/drop arm BSC transfer with assistance of 1 person (max A) OT Short Term Goal 2 (Week 2): Pt will sit EOB with mod A for 10 mins in prepearation for performing UB dressing tasks OT Short Term Goal 3 (Week 2): Pt will don pull over shirt with min A in supported sitting OT Short Term Goal 4 (Week 2): Pt will brush teeth with supervision/set up.   Therapy Documentation Precautions:  Precautions Precautions: Fall Precaution Comments: quadriparesis, move BLE very carefully due to pain Restrictions Weight Bearing Restrictions: No    See Function Navigator for Current Functional Status.  [name] [facility] 11/04/2015, 3:10 PM

## 2015-11-04 NOTE — Progress Notes (Signed)
Physical Therapy Weekly Progress Note  Patient Details  Name: Cynthia [name] MRN: [id number] Date of Birth: 04/06/1936  Beginning of progress report period: October 28, 2015 End of progress report period: November 04, 2015  Patient has met 4 of 4 short term goals due to slow steady progress this reporting period. Patient is now able to tolerate BLE in dependent position which has allowed for patient to transfer [place] at this time in order to sit up in recliner or TIS wheelchair, however patient is still limited by neuropathic pain in BUE/LE and pain in buttocks. She requires encouragement to attempt challenging tasks but participates fully in therapies. Patient requires mod A for rolling with rails, total A for sit <> supine, and mod-max A for static sitting balance for a maximum of 10 minutes edge of bed. Patient fatigues quickly and requires multiple rest breaks throughout therapies.   Patient continues to demonstrate the following deficits: quadriparesis, muscle weakness, muscle joint tightness, decreased cardiorespiratory endurance, impaired timing and sequencing, abnormal tone, unbalanced muscle activation, decreased coordination, decreased memory, decreased sitting balance, decreased postural control, and decreased balance strategies and therefore will continue to benefit from skilled PT intervention to enhance overall performance with activity tolerance, balance, postural control, ability to compensate for deficits, functional use of  right upper extremity, right lower extremity, left upper extremity and left lower extremity and coordination.  Patient progressing toward long term goals.  Continue plan of care.  PT Short Term Goals Week 1:  PT Short Term Goal 1 (Week 1): Patient will maintain sitting balance with max A x 3 minutes.  PT Short Term Goal 1 - Progress (Week 1): Met PT Short Term Goal 2 (Week 1): Patient will initiate transfers bed <> wheelchair.  PT Short Term Goal  2 - Progress (Week 1): Met PT Short Term Goal 3 (Week 1): Patient will tolerate OOB x 30 minutes.  PT Short Term Goal 3 - Progress (Week 1): Met PT Short Term Goal 4 (Week 1): Patient will perform rolling with hospital bed functions with max A.  PT Short Term Goal 4 - Progress (Week 1): Met Week 2:  PT Short Term Goal 1 (Week 2): Patient will perform rolling with hospital bed functions with min A. PT Short Term Goal 2 (Week 2): Patient will perform slide board transfers bed <> wheelchair with +2A.  PT Short Term Goal 3 (Week 2): Patient will maintain sitting balance with mod A x 1 minute. PT Short Term Goal 4 (Week 2): Patient will recall pressure relief techniques and call for assistance with mod cues 75% of time.  See Function Navigator for Current Functional Status.  Therapy/Group: Individual Therapy  Laretta Alstrom 11/04/2015, 5:54 PM

## 2015-11-04 NOTE — Progress Notes (Signed)
Social Work Patient ID: Cynthia [name], female   DOB: 1936-05-01, 80 y.o.   MRN: [id number]   Met yesterday with pt and daughter to review team conference.  Both pleased with report and seeing for themselves the gains over the past few days.  Pt much brighter and encouraged by her gains.  Both aware and agreeable with targeted d/c date 4/11.  No concerns at this time.  Roshana Shuffield, LCSW

## 2015-11-04 NOTE — Progress Notes (Signed)
Pt did NIF-38 and FVC 1.5L pt had good effort.

## 2015-11-04 NOTE — Progress Notes (Signed)
Physical Therapy Session Note  Patient Details  Name: Cynthia [name] MRN: [id number] Date of Birth: 20-Oct-1935  Today's Date: 11/04/2015 PT Individual Time: 1300-1345 PT Individual Time Calculation (min): 45 min   Short Term Goals: Week 1:  PT Short Term Goal 1 (Week 1): Patient will maintain sitting balance with max A x 3 minutes.  PT Short Term Goal 2 (Week 1): Patient will initiate transfers bed <> wheelchair.  PT Short Term Goal 3 (Week 1): Patient will tolerate OOB x 30 minutes.  PT Short Term Goal 4 (Week 1): Patient will perform rolling with hospital bed functions with max A.   Skilled Therapeutic Interventions/Progress Updates:   Patient semi reclined in bed, daughter assisting patient with eating lunch. Daughter educated on proper wear/kickstand positioning of PRAFOs. Patient reporting increased buttocks pain, positioned in R sidelying with mod A using rail for pressure relief. Discussed trying to get OOB via slide board this date but patient reported buttocks pain with shearing at last attempt. Reclining back wheelchair in room with flat tire and inadequate pressure relief for patient, switched out wheelchair for 18 X 18 tilt in space wheelchair with Roho cushion for improved OOB sitting tolerance and pressure relief. Patient rolled with cues to hook UE on rail due to failed attempts at grasping rail with mod A to place sling under patient and transferred bed > TIS wheelchair via [place] total A. Patient's leg rests shortened and Roho cushion adjusted for proper positioning in wheelchair. Patient reporting decreased buttock pain in wheelchair and willing to stay up in chair with daughter present. Full lap tray placed in room for BUE positioning, but patient declined to use at this time.   Therapy Documentation Precautions:  Precautions Precautions: Fall Precaution Comments: quadriparesis, move BLE very carefully due to pain Restrictions Weight Bearing Restrictions: No Pain:  Unrated buttocks pain, positioned in L sidelying for pressure relief, switched reclining back wheelchair for TIS wheelchair  See Function Navigator for Current Functional Status.   Therapy/Group: Individual Therapy  Laretta Alstrom 11/04/2015, 2:01 PM

## 2015-11-04 NOTE — Progress Notes (Signed)
Occupational Therapy Session Note  Patient Details  Name: Cynthia [name] MRN: [id number] Date of Birth: [birth year]  Today's Date: 11/04/2015 OT Individual Time: [id number] Individual Time Calculation (min): 45 min    Short Term Goals: Week 1:  OT Short Term Goal 1 (Week 1): Pt will tolerate 7 minutes of functional task with no more than 2 rest breaks in order to increase endurance for functional tasks.  OT Short Term Goal 2 (Week 1): Pt will perform UB bathing  in supported position with min A in order to increase I with self care.  OT Short Term Goal 3 (Week 1): Pt will perform toilet transfer with assistance of 1 person in order to decrease level of assist for functional transfers.  OT Short Term Goal 4 (Week 1): Pt willl tolerate sitting wheelchair for functional tasks at sink for 20 minutes or greater in order to increase activity tolerance.   Skilled Therapeutic Interventions/Progress Updates:    Pt resting in bed upon arrival.  Pt initially engaged in bed mobility (rolling to right and left) to facilitate donning of Ted hose and pants.  Pt declined changing shirt this morning.  Pt engaged in brushing teeth, combing hair, and washing face.  Pt noted with increased control brushing teeth this morning requiring min A at her elbow for support and repositioning of tooth brush in her hand.  Pt required hand over hand assist with combing her hair.  Pt was able to reposition herself in bed when she began to lean to her left while in bed with HOB elevated.  Focus on increased use of BUE for BADLs and activity tolerance to increase independence with BADLs and reduce burden of care.   Therapy Documentation Precautions:  Precautions Precautions: Fall Precaution Comments: quadriparesis, move BLE very carefully due to pain Restrictions Weight Bearing Restrictions: No Pain: Pain Assessment Pain Assessment: 0-10 Pain Score: 7  Pain Type: Neuropathic pain Pain Location: Generalized Pain  Orientation: Right;Left Pain Descriptors / Indicators: Burning;Tingling Pain Onset: On-going Pain Intervention(s): RN made aware;Repositioned  See Function Navigator for Current Functional Status.   Therapy/Group: Individual Therapy  Leroy Libman 11/04/2015, 8:48 AM

## 2015-11-05 ENCOUNTER — Inpatient Hospital Stay (HOSPITAL_COMMUNITY): Payer: Medicare Other | Admitting: Physical Therapy

## 2015-11-05 ENCOUNTER — Inpatient Hospital Stay (HOSPITAL_COMMUNITY): Payer: Self-pay | Admitting: Speech Pathology

## 2015-11-05 ENCOUNTER — Inpatient Hospital Stay (HOSPITAL_COMMUNITY): Payer: Medicare Other

## 2015-11-05 LAB — BASIC METABOLIC PANEL
Anion gap: 8 (ref 5–15)
BUN: 10 mg/dL (ref 6–20)
CALCIUM: 8.8 mg/dL — AB (ref 8.9–10.3)
CHLORIDE: 104 mmol/L (ref 101–111)
CO2: 25 mmol/L (ref 22–32)
CREATININE: 0.47 mg/dL (ref 0.44–1.00)
GFR calc non Af Amer: >60 mL/min (ref >60)
Glucose, Bld: 95 mg/dL (ref 65–99)
Potassium: 4 mmol/L (ref 3.5–5.1)
SODIUM: 137 mmol/L (ref 135–145)

## 2015-11-05 LAB — CBC
HCT: 34.9 % — ABNORMAL LOW (ref 36.0–46.0)
HEMOGLOBIN: 10.9 g/dL — AB (ref 12.0–15.0)
MCH: 28.5 pg (ref 26.0–34.0)
MCHC: 31.2 g/dL (ref 30.0–36.0)
MCV: 91.4 fL (ref 78.0–100.0)
Platelets: 298 10*3/uL (ref 150–400)
RBC: 3.82 MIL/uL — ABNORMAL LOW (ref 3.87–5.11)
RDW: 16.2 % — AB (ref 11.5–15.5)
WBC: 6.1 10*3/uL (ref 4.0–10.5)

## 2015-11-05 MED ORDER — BETHANECHOL CHLORIDE 25 MG PO TABS
25.0000 mg | ORAL_TABLET | Freq: Three times a day (TID) | ORAL | Status: DC
  Administered 2015-11-05 – 2015-11-18 (×39): 25 mg via ORAL
  Filled 2015-11-05 (×39): qty 1

## 2015-11-05 NOTE — Progress Notes (Signed)
Occupational Therapy Note  Patient Details  Name: Cynthia [name] MRN: [id number] Date of Birth: 06/29/1936  Today's Date: 11/05/2015 OT Individual Time: 1300-1330 OT Individual Time Calculation (min): 30 min   Pt c/o increased tingling in bilateral hands and feet; rest and repositioned, emotional support Individual Therapy  Pt resting in bed upon arrival with daughter present.  Pt stated she had just returned to bed after eating lunch.  Pt reported that her hands and feet were "really tingling" after morning therapies.  Discussed possible theories for this increase including increased use of BUE for morning tasks and use of the tilt table with PT.  Pt verbalized understanding.  Pt engaged in [place] therex with .25# wrist weights at bed level with supported sitting.  Pt continues to lean to her left in sitting but is able to self correct with verbal cues. Pt remained in bed at end of session with all needs within reach and daughter present at bedside.   [name] [facility] 11/05/2015, 3:11 PM

## 2015-11-05 NOTE — Progress Notes (Signed)
Occupational Therapy Session Note  Patient Details  Name: Cynthia [name] MRN: [id number] Date of Birth: [birth year]  Today's Date: 11/05/2015 OT Individual Time: 0930-1030 OT Individual Time Calculation (min): 60 min    Short Term Goals: Week 2:  OT Short Term Goal 1 (Week 2): Pt will perform toilet/drop arm BSC transfer with assistance of 1 person (max A) OT Short Term Goal 2 (Week 2): Pt will sit EOB with mod A for 10 mins in prepearation for performing UB dressing tasks OT Short Term Goal 3 (Week 2): Pt will don pull over shirt with min A in supported sitting OT Short Term Goal 4 (Week 2): Pt will brush teeth with supervision/set up.  Skilled Therapeutic Interventions/Progress Updates:    Pt resting in TIS w/c upon arrival.  Pt initially engaged in unsupported sitting in w/c requiring min A with no UE support.  Pt transitioned to table activities while sitting supported in upright position in w/c including grasping and placing pegs into board using progressively smaller pegs.  Pt noted with increased gross motor and fine motor control.  Pt exhibited some frustration with tasks but persevered through tasks with multiple rest breaks secondary to UE fatigue.  Pt returned to room and remained in w/c with all needs within reach.   Therapy Documentation Precautions:  Precautions Precautions: Fall Precaution Comments: quadriparesis, move BLE very carefully due to pain Restrictions Weight Bearing Restrictions: No  Pain: Pain Assessment Pain Assessment: 0-10 Pain Score: 9  Pain Type: Chronic pain Pain Location: Hand Pain Orientation: Right;Left Pain Descriptors / Indicators: Burning Pain Onset: On-going Pain Intervention(s): Pre medicated  See Function Navigator for Current Functional Status.   Therapy/Group: Individual Therapy  Leroy Libman 11/05/2015, 3:16 PM

## 2015-11-05 NOTE — Progress Notes (Signed)
Physical Therapy Session Note  Patient Details  Name: Cynthia [name] MRN: [id number] Date of Birth: 11/07/1935  Today's Date: 11/05/2015 PT Individual Time: [id number] and 1100-1200 PT Individual Time Calculation (min): 45 min and 60 min  Short Term Goals: Week 2:  PT Short Term Goal 1 (Week 2): Patient will perform rolling with hospital bed functions with min A. PT Short Term Goal 2 (Week 2): Patient will perform slide board transfers bed <> wheelchair with +2A.  PT Short Term Goal 3 (Week 2): Patient will maintain sitting balance with mod A x 1 minute. PT Short Term Goal 4 (Week 2): Patient will recall pressure relief techniques and call for assistance with mod cues 75% of time.  Skilled Therapeutic Interventions/Progress Updates:   Treatment 1: Patient in bed upon arrival, reporting buttocks pain and no breakfast. Patient educated on need to advocate for herself and call for assistance when she needs turned or needs a meal, verbalized understanding. Sign also placed in room to remind staff to turn patient every 2 hours. Donned pants and socks in bed with rolling with mod A using rails and assist to bend LE for total A lower body dressing and rolled to place Maximove sling. Transferred to [facility] wheelchair via [facility] with total A. Patient requesting to brush teeth. Wheelchair positioned in front of sink and patient required encouragement and coaxing to attempt oral care tasks with setup assist/supervision and cues to keep elbows on sink to decrease degrees of freedom and increase control and cues to slow down for improved success with tasks. Patient washed face and brushed hair with setup/supervision and increased time. Patient positioned in day room to eat breakfast meal with NT providing supervision.   Treatment 2: Patient sitting in TIS wheelchair upon arrival, reporting fatigue from busy morning but agreeable to participate. Performed slide board transfers wheelchair <> level mat table  with +2A for safety. Patient worked on static sitting balance edge of mat with both feet supported (PRAFOs donned) with mirror in front for visual feedback due to proprioceptive/sensory impairments with wedge positioned posteriorly to facilitate anterior pelvic tilt with BUE supported on mat until pain became unbearable and patient positioned BUE in lap. Patient required min-mod A for sitting balance and with min cuing patient able to initiate correcting lateral lean to L, provided supported seated rest breaks as needed due to fatigue. Patient transitioned to tilt table via Maximove and was able to tolerate 75% upright position on tilt table (PRAFOs donned) x 2-3 minutes but limited by increased cramping and pain in both feet and thighs. Patient returned to bed via Maximove and left supine with soft call bell on chest and bed alarm on.   Therapy Documentation Precautions:  Precautions Precautions: Fall Precaution Comments: quadriparesis, move BLE very carefully due to pain Restrictions Weight Bearing Restrictions: No Pain: Unrated neuropathic pain in BUE/BLE, provided rest and repositioned throughout session   See Function Navigator for Current Functional Status.   Therapy/Group: Individual Therapy  Laretta Alstrom 11/05/2015, 9:21 AM

## 2015-11-05 NOTE — Progress Notes (Signed)
NIF  -34   VC 1.75  Good effort

## 2015-11-05 NOTE — Progress Notes (Signed)
Patient performed NIF -40, VC 1.5L with good effort.

## 2015-11-05 NOTE — Progress Notes (Signed)
Subjective/Complaints: Feels a little tired this morning. Bladder not "doing much". Pain stable. Does feel she's getting stronger.   ROS: Pt denies fever, rash/itching, headache, blurred or double vision, nausea, vomiting, abdominal pain, diarrhea, chest pain, shortness of breath, palpitations, dysuria, dizziness, neck or back pain, bleeding, anxiety, or depression  Objective: Vital Signs: Blood pressure 112/64, pulse 74, temperature 98.4 F (36.9 C), temperature source Oral, resp. rate 18, height _0  (1.575 m), weight 59.9 kg (132 lb 0.9 oz), SpO2 96 %. No results found. Results for orders placed or performed during the hospital encounter of 10/27/15 (from the past 72 hour(s))  CBC     Status: Abnormal   Collection Time: 11/05/15  5:00 AM  Result Value Ref Range   WBC 6.1 4.0 - 10.5 K/uL   RBC 3.82 (L) 3.87 - 5.11 MIL/uL   Hemoglobin 10.9 (L) 12.0 - 15.0 g/dL   HCT 34.9 (L) 36.0 - 46.0 %   MCV 91.4 78.0 - 100.0 fL   MCH 28.5 26.0 - 34.0 pg   MCHC 31.2 30.0 - 36.0 g/dL   RDW 16.2 (H) 11.5 - 15.5 %   Platelets 298 150 - 400 K/uL  Basic metabolic panel     Status: Abnormal   Collection Time: 11/05/15  5:00 AM  Result Value Ref Range   Sodium 137 135 - 145 mmol/L   Potassium 4.0 3.5 - 5.1 mmol/L   Chloride 104 101 - 111 mmol/L   CO2 25 22 - 32 mmol/L   Glucose, Bld 95 65 - 99 mg/dL   BUN 10 6 - 20 mg/dL   Creatinine, Ser 0.47 0.44 - 1.00 mg/dL   Calcium 8.8 (L) 8.9 - 10.3 mg/dL   GFR calc non Af Amer >60 >60 mL/min   GFR calc Af Amer >60 >60 mL/min    Comment: (NOTE) The eGFR has been calculated using the CKD EPI equation. This calculation has not been validated in all clinical situations. eGFR's persistently <60 mL/min signify possible Chronic Kidney Disease.    Anion gap 8 5 - 15     HEENT: normal Cardio: RRR Resp: CTA B/L GI: BS positive and NT, ND Extremity:  No Edema Skin:   Intact Neuro: Alert/Oriented, Abnormal Sensory absent LT and Proprio in BLE below  knees, and Abnormal Motor 3+ LUE, 3 to 3+/5 RUE, 2- B hip ext KE tr-1,  APF trace. Sensation decreased in stocking glove distribution in both legs. Has little sense of gross touch in either foot. Hands/UE's with similar pattern but much less affected----no substantial changes today.  Musc/Skel:  Full ROM both ue and lower ext. No edema Gen Fatigued but NAD Psych: appropriate  Assessment/Plan: 1. 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 Physiatrist is providing close team supervision and 24 hour management of active medical problems listed below. Physiatrist and rehab team continue to assess barriers to discharge/monitor patient progress toward functional and medical goals. FIM: Function - Bathing Bathing activity did not occur: Safety/medical concerns Position: Bed Body parts bathed by patient: Chest Body parts bathed by helper: Right arm, Left arm, Abdomen, Front perineal area, Buttocks, Left upper leg, Right upper leg, Right lower leg, Left lower leg Bathing not applicable: Back Assist Level: 2 helpers (per nursing)  Function- Upper Body Dressing/Undressing What is the patient wearing?: Pull over shirt/dress Pull over shirt/dress - Perfomed by patient: Thread/unthread right sleeve, Thread/unthread left sleeve Pull  over shirt/dress - Perfomed by helper: Put head through opening, Pull shirt over trunk Assist Level: 2 helpers Function - Lower Body Dressing/Undressing What is the patient wearing?: Underwear, Pants, AFO, Ted Hose Position: Bed Underwear - Performed by helper: Thread/unthread right underwear leg, Thread/unthread left underwear leg, Pull underwear up/down Pants- Performed by helper: Thread/unthread right pants leg, Thread/unthread left pants leg, Pull pants up/down Non-skid slipper socks- Performed by helper: Don/doff right sock, Don/doff left sock AFO -  Performed by helper: Don/doff right AFO, Don/doff left AFO TED Hose - Performed by helper: Don/doff right TED hose, Don/doff left TED hose Assist for footwear: Dependant Assist for lower body dressing: 2 Helpers  Function - Toileting Toileting activity did not occur: No continent bowel/bladder event Toileting steps completed by patient: Adjust clothing prior to toileting, Performs perineal hygiene, Adjust clothing after toileting Toileting steps completed by helper: Adjust clothing prior to toileting, Performs perineal hygiene, Adjust clothing after toileting  Function - Air cabin crew transfer activity did not occur: Safety/medical concerns  Function - Chair/bed transfer Chair/bed transfer activity did not occur: Safety/medical concerns (unable to tolerate sitting upright EOB ) Chair/bed transfer method: Lateral scoot Chair/bed transfer assist level: Total assist (Pt < 25%) Chair/bed transfer assistive device: Mechanical lift Chair/bed transfer details: Manual facilitation for weight shifting, Manual facilitation for placement, Manual facilitation for weight bearing, Verbal cues for sequencing, Verbal cues for technique, Verbal cues for precautions/safety, Verbal cues for safe use of DME/AE  Function - Locomotion: Wheelchair Will patient use wheelchair at discharge?: Yes Type: Manual (TIS) Wheelchair activity did not occur: Safety/medical concerns (Too fatigued once up in [facility] ) Assist Level: Dependent (Pt equals 0%) Function - Locomotion: Ambulation Ambulation activity did not occur: Safety/medical concerns  Function - Comprehension Comprehension: Auditory Comprehension assist level: Follows complex conversation/direction with no assist  Function - Expression Expression: Verbal Expression assist level: Expresses complex ideas: With extra time/assistive device  Function - Social Interaction Social Interaction assist level: Interacts appropriately with others with  medication or extra time (anti-anxiety, antidepressant).  Function - Problem Solving Problem solving assist level: Solves complex problems: With extra time  Function - Memory Memory assist level: Recognizes or recalls 90% of the time/requires cueing < 10% of the time Patient normally able to recall (first 3 days only): Current season  Medical Problem List and Plan: 1.  Quadriplegia, numbness, gait instability secondary to GBS  -continue CIR-  -pt beginning to see some gains which is providing some motivation 2.  DVT Prophylaxis/Anticoagulation: Pharmaceutical: Lovenox  -dopplers with LLE peroneal/gastroc dvt---no change in plan at present  -re-check doppler prior to discharge to determine future course--next week 3. Pain Management: Tylenol prn. Limit narcotics due to cognitive effects     -gabapentin helpful. Increased to '200mg'$  bid and '300mg'$  qhs to capture night and early morning pain better  -continue TEDS also as these proved beneficial as well 4. Mood: LCSW to follow for evaluation and support.   5. Neuropsych: This patient is not fully capable of making decisions on her own behalf. 6. Skin/Wound Care: Routine pressure relief measures.   7. Fluids/Electrolytes/Nutrition: Monitor I/O.   -need to watch sodium levels--137 today  -I personally reviewed the patient's labs today.   -encourage PO.   8.  Citrobacter Koseri UTI: treated with ctx  -voiding trial, I/O cath prn.---volumes 100-300cc  -  urecholine ---increase to '25mg'$  tid 9. Colon cancer with liver mets: No complaints of pain.    10. UGIB with ABLA: H/H stable and anemia  resolving. Continue to monitor for any recurrent signs of bleeding.   Hgb 10.9, trending up--  -encourage fluids and ongoing acclimation.   LOS (Days) 9 A FACE TO FACE EVALUATION WAS PERFORMED  SWARTZ,ZACHARY T 11/05/2015, 8:55 AM

## 2015-11-06 ENCOUNTER — Inpatient Hospital Stay (HOSPITAL_COMMUNITY): Payer: Medicare Other | Admitting: Physical Therapy

## 2015-11-06 ENCOUNTER — Inpatient Hospital Stay (HOSPITAL_COMMUNITY): Payer: Self-pay | Admitting: Occupational Therapy

## 2015-11-06 ENCOUNTER — Inpatient Hospital Stay (HOSPITAL_COMMUNITY): Payer: Medicare Other | Admitting: Occupational Therapy

## 2015-11-06 NOTE — Progress Notes (Signed)
Occupational Therapy Session Note  Patient Details  Name: Cynthia [name] MRN: [id number] Date of Birth: [birth year]  Today's Date: 11/06/2015 OT Individual Time: 1000-1100 OT Individual Time Calculation (min): 60 min    Short Term Goals: Week 2:  OT Short Term Goal 1 (Week 2): Pt will perform toilet/drop arm BSC transfer with assistance of 1 person (max A) OT Short Term Goal 2 (Week 2): Pt will sit EOB with mod A for 10 mins in prepearation for performing UB dressing tasks OT Short Term Goal 3 (Week 2): Pt will don pull over shirt with min A in supported sitting OT Short Term Goal 4 (Week 2): Pt will brush teeth with supervision/set up.  Skilled Therapeutic Interventions/Progress Updates:    Pt seen for skilled OT to facilitate trunk control, postural awareness, BUE motor control. Reviewed with pt the short term goals for this week and that the therapy session today is working towards those goals as she stated that everything would be too difficult for her today. Pt in w/c, at sink grooming with set up teeth and hand over hand with brushing hair as pt upset that she could not fully brush her hair.   Pt taken to gym to work on slide board transfers to mat, followed by sitting balance exercises of lateral wt shifting and lifting alternating arms to shoulder height with max A to support balance.  Pt worked on trunk elongation with therapist providing input through mid back while pt lifted her sternum for 3-4 counts, 10 reps.  Pt returned to chair c/o pain/discomfort from hips/thighs on slide board. Pt comfortable once settled in w/c. Pt requested to go back to bed. Slide board to bed, pt adjusted in bed with all needs met.    Therapy Documentation Precautions:  Precautions Precautions: Fall Precaution Comments: quadriparesis, move BLE very carefully due to pain Restrictions Weight Bearing Restrictions: No      Pain: Pain Assessment Pain Assessment: Faces Faces Pain Scale: Hurts  whole lot Pain Type: Neuropathic pain Pain Location: Hand Pain Orientation: Right;Left Pain Descriptors / Indicators: Burning Pain Onset: On-going Pain Intervention(s): MD notified (Comment) ADL:   See Function Navigator for Current Functional Status.   Therapy/Group: Individual Therapy  Nickole Adamek 11/06/2015, 12:44 PM

## 2015-11-06 NOTE — Progress Notes (Signed)
NIF and VC performed with patient.  NIF -50, VC 1.5L with good effort.

## 2015-11-06 NOTE — Progress Notes (Signed)
Pt doing well with NIF and VC x10 days. Will ask MD if she continues to need or if they can be DC'd

## 2015-11-06 NOTE — Progress Notes (Signed)
Subjective/Complaints: Hands cold, tingling. Tells me today they hurt more when she moves---pattern had been that she's doing better as the day goes along. Pt does admit, however, that pain is improved. Still frustrated by urine retention   ROS: Pt denies fever, rash/itching, headache, blurred or double vision, nausea, vomiting, abdominal pain, diarrhea, chest pain, shortness of breath, palpitations, dysuria, dizziness, neck or back pain, bleeding, anxiety, or depression  Objective: Vital Signs: Blood pressure 126/67, pulse 79, temperature 98.4 F (36.9 C), temperature source Oral, resp. rate 18, height _0  (1.575 m), weight 61 kg (134 lb 7.7 oz), SpO2 98 %. No results found. Results for orders placed or performed during the hospital encounter of 10/27/15 (from the past 72 hour(s))  CBC     Status: Abnormal   Collection Time: 11/05/15  5:00 AM  Result Value Ref Range   WBC 6.1 4.0 - 10.5 K/uL   RBC 3.82 (L) 3.87 - 5.11 MIL/uL   Hemoglobin 10.9 (L) 12.0 - 15.0 g/dL   HCT 34.9 (L) 36.0 - 46.0 %   MCV 91.4 78.0 - 100.0 fL   MCH 28.5 26.0 - 34.0 pg   MCHC 31.2 30.0 - 36.0 g/dL   RDW 16.2 (H) 11.5 - 15.5 %   Platelets 298 150 - 400 K/uL  Basic metabolic panel     Status: Abnormal   Collection Time: 11/05/15  5:00 AM  Result Value Ref Range   Sodium 137 135 - 145 mmol/L   Potassium 4.0 3.5 - 5.1 mmol/L   Chloride 104 101 - 111 mmol/L   CO2 25 22 - 32 mmol/L   Glucose, Bld 95 65 - 99 mg/dL   BUN 10 6 - 20 mg/dL   Creatinine, Ser 0.47 0.44 - 1.00 mg/dL   Calcium 8.8 (L) 8.9 - 10.3 mg/dL   GFR calc non Af Amer >60 >60 mL/min   GFR calc Af Amer >60 >60 mL/min    Comment: (NOTE) The eGFR has been calculated using the CKD EPI equation. This calculation has not been validated in all clinical situations. eGFR's persistently <60 mL/min signify possible Chronic Kidney Disease.    Anion gap 8 5 - 15     HEENT: normal Cardio: RRR Resp: CTA B/L GI: BS positive and NT,  ND Extremity:  No Edema Skin:   Intact Neuro: Alert/Oriented, Abnormal Sensory absent LT and Proprio in BLE below knees, and Abnormal Motor 3+ LUE, 3 to 3+/5 RUE, 2- B hip ext KE tr-1,  APF trace. Sensation decreased in stocking glove distribution in both legs. Has little sense of gross touch in either foot. Hands/UE's with similar pattern but much less affected----no substantial changes today.  Musc/Skel:  Full ROM both ue and lower ext. No edema Gen Fatigued but NAD Psych: appropriate  Assessment/Plan: 1. 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 Physiatrist is providing close team supervision and 24 hour management of active medical problems listed below. Physiatrist and rehab team continue to assess barriers to discharge/monitor patient progress toward functional and medical goals. FIM: Function - Bathing Bathing activity did not occur: Safety/medical concerns Position: Bed Body parts bathed by patient: Chest Body parts bathed by helper: Right arm, Left arm, Chest, Abdomen, Front perineal area, Buttocks, Right upper leg, Left upper leg, Right lower leg, Left lower leg, Back (per nursing total assist) Bathing not applicable: Back Assist Level: 2 helpers (per nursing)  Function-  Upper Body Dressing/Undressing What is the patient wearing?: Pull over shirt/dress Pull over shirt/dress - Perfomed by patient: Put head through opening Pull over shirt/dress - Perfomed by helper: Pull shirt over trunk, Thread/unthread right sleeve, Thread/unthread left sleeve Assist Level: Touching or steadying assistance(Pt > 75%) Function - Lower Body Dressing/Undressing What is the patient wearing?: Pants Position: Bed Underwear - Performed by helper: Thread/unthread right underwear leg, Thread/unthread left underwear leg, Pull underwear up/down Pants- Performed by helper: Thread/unthread  right pants leg, Thread/unthread left pants leg, Pull pants up/down Non-skid slipper socks- Performed by helper: Don/doff right sock, Don/doff left sock AFO - Performed by helper: Don/doff right AFO, Don/doff left AFO TED Hose - Performed by helper: Don/doff right TED hose, Don/doff left TED hose Assist for footwear: Dependant Assist for lower body dressing: 2 Helpers  Function - Toileting Toileting activity did not occur: No continent bowel/bladder event Toileting steps completed by patient: Adjust clothing prior to toileting, Performs perineal hygiene, Adjust clothing after toileting Toileting steps completed by helper: Adjust clothing prior to toileting, Performs perineal hygiene, Adjust clothing after toileting  Function - Air cabin crew transfer activity did not occur: Safety/medical concerns  Function - Chair/bed transfer Chair/bed transfer activity did not occur: Safety/medical concerns (unable to tolerate sitting upright EOB ) Chair/bed transfer method: Lateral scoot Chair/bed transfer assist level: Total assist (Pt < 25%) Chair/bed transfer assistive device: Mechanical lift Mechanical lift: Maximove Chair/bed transfer details: Manual facilitation for weight shifting, Manual facilitation for placement, Manual facilitation for weight bearing, Verbal cues for sequencing, Verbal cues for technique, Verbal cues for precautions/safety, Verbal cues for safe use of DME/AE  Function - Locomotion: Wheelchair Will patient use wheelchair at discharge?: Yes Type: Manual (TIS) Wheelchair activity did not occur: Safety/medical concerns (Too fatigued once up in [facility] ) Assist Level: Dependent (Pt equals 0%) Function - Locomotion: Ambulation Ambulation activity did not occur: Safety/medical concerns  Function - Comprehension Comprehension: Auditory Comprehension assist level: Follows complex conversation/direction with no assist  Function - Expression Expression: Verbal Expression  assist level: Expresses complex ideas: With extra time/assistive device  Function - Social Interaction Social Interaction assist level: Interacts appropriately with others with medication or extra time (anti-anxiety, antidepressant).  Function - Problem Solving Problem solving assist level: Solves complex problems: With extra time  Function - Memory Memory assist level: Recognizes or recalls 75 - 89% of the time/requires cueing 10 - 24% of the time Patient normally able to recall (first 3 days only): Current season  Medical Problem List and Plan: 1.  Quadriplegia, numbness, gait instability secondary to GBS  -continue CIR-  -continue to provide positive reinforcement 2.  DVT Prophylaxis/Anticoagulation: Pharmaceutical: Lovenox  -dopplers with LLE peroneal/gastroc dvt---no change in plan at present  -re-check doppler prior to discharge to determine future course--will recheck Tues or Wed next week 3. Pain Management: Tylenol prn. Limit narcotics due to cognitive effects     -gabapentin helpful. Increased to 273m bid and 301mqhs to capture night and early morning pain better  -continue TEDS also as these proved beneficial as well 4. Mood: LCSW to follow for evaluation and support.   5. Neuropsych: This patient is not fully capable of making decisions on her own behalf. 6. Skin/Wound Care: Routine pressure relief measures.   7. Fluids/Electrolytes/Nutrition: Monitor I/O.   -need to watch sodium levels--137      -encourage PO.   8.  Citrobacter Koseri UTI: treated with ctx  -voiding trial, I/O cath prn.---volumes 100-300cc  -  urecholine ---increase  to 27m tid  -recheck urine culture today 9. Colon cancer with liver mets: No complaints of pain.    10. UGIB with ABLA: H/H stable and anemia resolving. Continue to monitor for any recurrent signs of bleeding.   Hgb 10.9, trending up--  -encourage fluids and ongoing acclimation.   LOS (Days) 10 A FACE TO FACE EVALUATION WAS  PERFORMED  Xhaiden Coombs T 11/06/2015, 9:38 AM

## 2015-11-06 NOTE — Progress Notes (Signed)
Occupational Therapy Session Note  Patient Details  Name: Cynthia [name] MRN: [id number] Date of Birth: [birth year]  Today's Date: 11/06/2015 OT Individual Time: 1300-1400 OT Individual Time Calculation (min): 60 min   Short Term Goals: Week 1:  OT Short Term Goal 1 (Week 1): Pt will tolerate 7 minutes of functional task with no more than 2 rest breaks in order to increase endurance for functional tasks.  OT Short Term Goal 1 - Progress (Week 1): Met OT Short Term Goal 2 (Week 1): Pt will perform UB bathing  in supported position with min A in order to increase I with self care.  OT Short Term Goal 2 - Progress (Week 1): Discontinued (comment) (Pt receiving nursing night baths at present) OT Short Term Goal 3 (Week 1): Pt will perform toilet transfer with assistance of 1 person in order to decrease level of assist for functional transfers.  OT Short Term Goal 3 - Progress (Week 1): Progressing toward goal OT Short Term Goal 4 (Week 1): Pt willl tolerate sitting wheelchair for functional tasks at sink for 20 minutes or greater in order to increase activity tolerance.  OT Short Term Goal 4 - Progress (Week 1): Met   Week 2:  OT Short Term Goal 1 (Week 2): Pt will perform toilet/drop arm BSC transfer with assistance of 1 person (max A) OT Short Term Goal 2 (Week 2): Pt will sit EOB with mod A for 10 mins in prepearation for performing UB dressing tasks OT Short Term Goal 3 (Week 2): Pt will don pull over shirt with min A in supported sitting OT Short Term Goal 4 (Week 2): Pt will brush teeth with supervision/set up.  Skilled Therapeutic Interventions/Progress Updates:  Patient received supine in bed. Pt with 10/10 complaints of pain in BLEs during any type of movement and 10/10 complaints of pain in bilateral hands. Pt engaged in bed mobility of rolling left to/from right in order for therapist to check brief for any type of incontinence accident. Pt with small BM. Therapist assisted with  doffing brief, cleaning buttock, donning new brief. Pt min assist +2 for rolling left to/from right; requiring cueing for technique and sequencing during rolling. From here, pt engaged in side lying to sit with mod assist +2; assistance for management of BLEs and trunk control into sitting. Seated EOB, for static sitting, pt required mod assist. During bed mobility, pt starting to cry out in pain of BLEs. Therapist assisted with slide board transfer EOB to w/c, max assist +2(for safety). Therapist went over step-by-step, pt unable to verbalize step-by-step. Pt stating, "I just slide over into the chair.Marland Kitcheni don't know!". Assisted with positioning in chair with max assist. Pt then eager to eat lunch. Assisted pt with self-feeding. Pt able to eat sandwich independently. Therapist provided hand over hand assist with red built up foam for soup and cake & therapist managed cup for liquids. Patient ate about half meal and started to fatigue. Therapist then engaged patient in desensitization activity to help decrease pain in bilateral hands. Put bilateral hands in warm water and had patient move fingers, encouraged daughter to bring in marbles for patient to move around in water. After water activity, therapist put hands in two separate zip lock bags full of oatmeal and encouraged her to move her fingers around. Towards end of session, pt with increased fatigue and was ready for a break. At end of session, left pt seated in w/c with all needs within reach and daughter present.  Encouraged daughter to bring in marbles, uncooked rice, and microfiber towel to help with patient's increased pain/cold sensation to bilateral hands.   Therapy Documentation Precautions:  Precautions Precautions: Fall Precaution Comments: quadriparesis, move BLE very carefully due to pain Restrictions Weight Bearing Restrictions: No  See Function Navigator for Current Functional Status.  Therapy/Group: Individual Therapy  Chrys Racer ,  MS, OTR/L, CLT  11/06/2015, 2:07 PM

## 2015-11-06 NOTE — Progress Notes (Signed)
Physical Therapy Session Note  Patient Details  Name: Cynthia [name] MRN: [id number] Date of Birth: May 28, 1936  Today's Date: 11/06/2015 PT Individual Time: 0800-0850 and 1430-1510 PT Individual Time Calculation (min): 50 min and 40 min  Short Term Goals: Week 2:  PT Short Term Goal 1 (Week 2): Patient will perform rolling with hospital bed functions with min A. PT Short Term Goal 2 (Week 2): Patient will perform slide board transfers bed <> wheelchair with +2A.  PT Short Term Goal 3 (Week 2): Patient will maintain sitting balance with mod A x 1 minute. PT Short Term Goal 4 (Week 2): Patient will recall pressure relief techniques and call for assistance with mod cues 75% of time.  Skilled Therapeutic Interventions/Progress Updates:   Treatment 1: Patient in bed reporting increased burning/tightness/coldness in both hands this morning. Patient requested to don bra this AM, performed rolling in bed to L and R using rails with mod A multiple times for LB dressing total A and UB dressing with total A for bra and patient assisting with threading BUE through sleeves and able to pull shirt over head with supervision. Performed supine AAROM heel slides, PROM BLE single knee to chest, hip abductors, hip adductors, hip IR, hip ER, ankle DF, hamstring stretch x 30-60 sec each stretch, patient with increased pain/tearful with all movements. Discussed need for daughter to come in for training for lower extremity stretching program. Patient rolled to R and L again to place [facility] sling and transferred to wheelchair via [facility] left in [place] wheelchair with soft call bell on lap and NT notified of patient need to eat breakfast.   Treatment 2: Patient in wheelchair upon arrival with daughter present to observe session. Patient reported buttocks pain, performed pressure relief in TIS wheelchair with minimal relief and chest pain from bra, performed forward lean onto elbows in wheelchair to doff bra total A  and patient able to push self back up to seated position. Patient and daughter educated on importance of BLE stretching program and training scheduled with daughter for Monday PM. Patient able to verbalize 75% of wheelchair setup in preparation for slide board transfer with min cues. Performed slide board transfer wheelchair <> mat table and wheelchair > bed with +2A for safety, patient required max cues for safe hand placement due to decreased coordination/proprioception and muscle activation noted with patient attempting to scoot but unable to move across board without max A. Sitting balance edge of mat with BLE support and B hands on mat table with improved static sitting balance compared to yesterday, min A overall. Patient able to sit 3-5 sec without assist before LOB requiring max-total A to recover. Patient instructed in lateral leans on forearm to R and L and pushing trunk up from mat table to work on bed mobility and pressure relief with greatly increased pain in B hips with patient crying, only able to tolerate one transition each direction with max A. Patient requesting to return to bed, left semi reclined with all needs in reach and daughter present.   Therapy Documentation Precautions:  Precautions Precautions: Fall Precaution Comments: quadriparesis, move BLE very carefully due to pain Restrictions Weight Bearing Restrictions: No Pain: Pain Assessment Pain Assessment: Faces Faces Pain Scale: Hurts whole lot Pain Type: Neuropathic pain Pain Location: Hand Pain Orientation: Right;Left Pain Descriptors / Indicators: Burning Pain Onset: On-going Pain Intervention(s): MD notified (Comment)   See Function Navigator for Current Functional Status.   Therapy/Group: Individual Therapy  Laretta Alstrom 11/06/2015,  8:59 AM

## 2015-11-07 ENCOUNTER — Inpatient Hospital Stay (HOSPITAL_COMMUNITY): Payer: Self-pay | Admitting: Physical Therapy

## 2015-11-07 DIAGNOSIS — R110 Nausea: Secondary | ICD-10-CM

## 2015-11-07 NOTE — Plan of Care (Signed)
Problem: SCI BLADDER ELIMINATION Goal: RH STG MANAGE BLADDER WITH ASSISTANCE STG Manage Bladder With max Assistance  Outcome: Not Progressing I/O cath required

## 2015-11-07 NOTE — Progress Notes (Signed)
Patient performed a NIF of -40 and FVC of 1.5L Good effort shown.

## 2015-11-07 NOTE — Progress Notes (Signed)
Cynthia [name] is a 80 y.o. female [birth year] [id number]  Subjective: Nausea this AM but no vomitting or pain (chest/abd) - feels like food not going down but took apple sauce and meds w/o problems. No other new problems. Slept well. Feeling OK.  Objective: Vital signs in last 24 hours: Temp:  [97.6 F (36.4 C)-97.8 F (36.6 C)] 97.8 F (36.6 C) (03/25 0536) Pulse Rate:  [68-92] 68 (03/25 0536) Resp:  [18] 18 (03/25 0536) BP: (121-133)/(65-78) 133/65 mmHg (03/25 0536) SpO2:  [97 %-99 %] 99 % (03/25 0536) Weight:  [57.3 kg (126 lb 5.2 oz)] 57.3 kg (126 lb 5.2 oz) (03/25 0536) Weight change: -3.7 kg (-8 lb 2.5 oz) Last BM Date: 11/06/15  Intake/Output from previous day: 03/24 0701 - 03/25 0700 In: 720 [P.O.:720] Out: 500 [Urine:500]  Physical Exam General: No apparent distress   Dtr at side Lungs: Normal effort. Lungs clear to auscultation, no crackles or wheezes. Cardiovascular: Regular rate and rhythm, no edema Abd: SNTND, +BS Neurological: No new neurological deficits   Lab Results: BMET    Component Value Date/Time   NA 137 11/05/2015 0500   NA 141 07/16/2015 0755   K 4.0 11/05/2015 0500   K 4.4 07/16/2015 0755   CL 104 11/05/2015 0500   CO2 25 11/05/2015 0500   CO2 26 07/16/2015 0755   GLUCOSE 95 11/05/2015 0500   GLUCOSE 87 07/16/2015 0755   BUN 10 11/05/2015 0500   BUN 14.7 07/16/2015 0755   CREATININE 0.47 11/05/2015 0500   CREATININE 0.9 07/16/2015 0755   CREATININE 0.79 09/27/2013 1548   CALCIUM 8.8* 11/05/2015 0500   CALCIUM 9.5 07/16/2015 0755   GFRNONAA >60 11/05/2015 0500   GFRAA >60 11/05/2015 0500   CBC    Component Value Date/Time   WBC 6.1 11/05/2015 0500   WBC 6.0 07/14/2014 1149   RBC 3.82* 11/05/2015 0500   RBC 4.24 07/14/2014 1149   RBC 3.77* 09/03/2013 1425   HGB 10.9* 11/05/2015 0500   HGB 13.3 07/14/2014 1149   HCT 34.9* 11/05/2015 0500   HCT 36.9 01/11/2015 1920   HCT 40.5 07/14/2014 1149   PLT 298 11/05/2015 0500   PLT 220 07/14/2014 1149   MCV 91.4 11/05/2015 0500   MCV 95.5 07/14/2014 1149   MCH 28.5 11/05/2015 0500   MCH 31.4 07/14/2014 1149   MCHC 31.2 11/05/2015 0500   MCHC 32.8 07/14/2014 1149   RDW 16.2* 11/05/2015 0500   RDW 17.3* 07/14/2014 1149   LYMPHSABS 1.0 10/28/2015 0652   LYMPHSABS 1.5 07/14/2014 1149   MONOABS 0.4 10/28/2015 0652   MONOABS 0.4 07/14/2014 1149   EOSABS 0.1 10/28/2015 0652   EOSABS 0.1 07/14/2014 1149   BASOSABS 0.0 10/28/2015 0652   BASOSABS 0.0 07/14/2014 1149   CBG's (last 3):  No results for input(s): GLUCAP in the last 72 hours. LFT's Lab Results  Component Value Date   ALT 20 10/28/2015   AST 23 10/28/2015   ALKPHOS 42 10/28/2015   BILITOT 0.9 10/28/2015    Studies/Results: No results found.  Medications:  I have reviewed the patient's current medications. Scheduled Medications: . antiseptic oral rinse  7 mL Mouth Rinse q12n4p  . atorvastatin  40 mg Oral q1800  . bethanechol  25 mg Oral TID  . chlorhexidine  15 mL Mouth Rinse BID  . enoxaparin (LOVENOX) injection  40 mg Subcutaneous Q24H  . gabapentin  200 mg Oral BID  . gabapentin  300 mg Oral QHS  . Gerhardt's butt  cream   Topical QID  . lactose free nutrition  237 mL Oral TID BM  . lidocaine  2 patch Transdermal Q24H  . metoprolol succinate  37.5 mg Oral QHS  . pantoprazole  40 mg Oral Daily  . polyethylene glycol  17 g Oral BID  . QUEtiapine  12.5 mg Oral QHS   PRN Medications: acetaminophen, acetaminophen, alum & mag hydroxide-simeth, bisacodyl, diphenhydrAMINE, guaiFENesin-dextromethorphan, prochlorperazine **OR** prochlorperazine **OR** prochlorperazine, sodium phosphate  Assessment/Plan: Principal Problem:   Quadriplegia and quadriparesis (HCC) Active Problems:   GBS (Guillain-Barre syndrome) (HCC)   Numbness and tingling   Paresthesias   UTI (lower urinary tract infection)   History of colon cancer   Acute blood loss anemia   Upper GI bleed  1. Quadriplegia,  numbness, gait instability secondary to GBS -continue CIR- -continue to provide positive reinforcement 2. DVT Prophylaxis/Anticoagulation: Pharmaceutical: Lovenox -dopplers with LLE peroneal/gastroc dvt---no change in plan at present -re-check doppler prior to discharge to determine future course--will recheck Tues or Wed next week 3. Pain Management: Tylenol prn. Limit narcotics due to cognitive effects  -gabapentin helpful. Increased to 200mg  bid and 300mg  qhs to capture night and early morning pain better -continue TEDS also as these proved beneficial as well 4. Mood: LCSW to follow for evaluation and support.  5. Neuropsych: This patient is not fully capable of making decisions on her own behalf. 6. Skin/Wound Care: Routine pressure relief measures.  7. Fluids/Electrolytes/Nutrition: Monitor I/O.  -need to watch sodium levels--137   -encourage PO.  8. Citrobacter Koseri UTI 3/9: resolved with abx -voiding trial, I/O cath prn.---volumes 100-300cc - urecholine ---increase to 25mg  tid 9. Colon cancer with liver mets: No complaints of pain.  10. UGIB with ABLA: H/H stable and anemia resolving. Continue to monitor for any recurrent signs of bleeding. Hgb 10.9, trending up-- -encourage fluids and ongoing acclimation. 11. Nausea - good BS, no vomiting - monitor with sxc care for now   LOS (Days) 10 Length of stay, days: 11   Orhan Mayorga A. Asa Lente, MD 11/07/2015, 10:41 AM

## 2015-11-07 NOTE — Progress Notes (Signed)
Physical Therapy Session Note  Patient Details  Name: Cynthia [name] MRN: [id number] Date of Birth: Nov 27, 1935  Today's Date: 11/07/2015 PT Individual Time: 1594-7076 PT Individual Time Calculation (min): 43 min   Short Term Goals: Week 2:  PT Short Term Goal 1 (Week 2): Patient will perform rolling with hospital bed functions with min A. PT Short Term Goal 2 (Week 2): Patient will perform slide board transfers bed <> wheelchair with +2A.  PT Short Term Goal 3 (Week 2): Patient will maintain sitting balance with mod A x 1 minute. PT Short Term Goal 4 (Week 2): Patient will recall pressure relief techniques and call for assistance with mod cues 75% of time.  Skilled Therapeutic Interventions/Progress Updates:    Patient received semirecumbent in bed. Roll R and L and MaxA-totalA with use of bed rails for don pants PT facilitated LE placement for improve use of LE to push into sidelying. R sidelying to sit with total A +2 for balance in sitting position x 2. Sitting balance for 10 minutes with up to 30 seconds without assistance for PT using BUE for support. PT provided verbal and tactile instruciton for improved UE placement and increased postural activation, Patient was able to prevent 2 posterior falls by adjusting hands before LOB.  Slide board transfer from bed to tilt in space WC with total A +2 to allow decreased pressure through bottom. Sitting balance in neutral while in WC, with BUE in lap x 3 minutes without Assist from PT. Alternating Reach with one UE within BOS while sitting in WC x 5 each direction, with cues for improve postural alignment and protective responses from PT. Patient demonstrated improved postural control reaching with R UE compared to left. Patient left sitting in tilt in space WC for pressure relief with call  Bell within reach and all other needs met.     Therapy Documentation Precautions:  Precautions Precautions: Fall Precaution Comments: quadriparesis, move  BLE very carefully due to pain Restrictions Weight Bearing Restrictions: No  See Function Navigator for Current Functional Status.   Therapy/Group: Individual Therapy  Lorie Phenix 11/07/2015, 5:19 PM

## 2015-11-08 ENCOUNTER — Inpatient Hospital Stay (HOSPITAL_COMMUNITY): Payer: Medicare Other | Admitting: Occupational Therapy

## 2015-11-08 NOTE — Progress Notes (Signed)
Occupational Therapy Session Note  Patient Details  Name: Cynthia [name] MRN: [id number] Date of Birth: 1935-10-03  Today's Date: 11/08/2015 OT Individual Time: [id number] Individual Time Calculation (min): 45 min    Skilled Therapeutic Interventions/Progress Updates:    Pt washed her face in supine with HOB up when presented with washcloth.  She continues to report increased numbness and tingling in her hands with all tasks.  Assisted with donning pants, TEDS, and shoes in bed with mod assist for rolling side to side while therapist provided total assist to pull the pants up.  Transitioned to sitting from sidelying with max assist.  Pt needed mod assist to maintain sitting balance EOB with LOB noted in both posterior and anterior directions.  She attempts to use UEs for support as well but demonstrates decreased ability to stabilize them on the surface and maintain secondary to weakness, impaired sensation, and ataxia.  Used sliding board for transfer to the wheelchair with total assist +2 (pt 25%).  Once in the chair had pt work on grooming tasks of brushing her hair and her teeth with built up handle utensils.  She was able to brush her teeth with setup but needed max hand over hand to brush her hair with the RUE.  Finished session with transition to the gym and work on picking up cups and flipping them over as well as stacking them using both UEs.  She demonstrated difficulty with grasping light weight cup without knocking it over and then turning it over without dropping it.  Pt's daughter present throughout session.  Once she finished transitioned her back to the room where she remained up in her chair with soft call button in lap.  Daughter also remained present.  Encouraged them to go out to the family room but pt did not want to go.  May benefit from grounds pass and discussed this with the daughter as well to look into.    Therapy Documentation Precautions:  Precautions Precautions:  Fall Precaution Comments: quadriparesis, move BLE very carefully due to pain Restrictions Weight Bearing Restrictions: No  Pain: Pain Assessment Pain Assessment: Faces Faces Pain Scale: Hurts little more Pain Type: Neuropathic pain Pain Location: Leg Pain Orientation: Right;Left Pain Descriptors / Indicators: Discomfort;Numbness;Shooting Pain Intervention(s): Repositioned;Emotional support ADL: See Function Navigator for Current Functional Status.   Therapy/Group: Individual Therapy  Tali Cleaves OTR/L 11/08/2015, 12:36 PM

## 2015-11-08 NOTE — Progress Notes (Signed)
Cynthia [name] is a 80 y.o. female [birth year] [id number]  Subjective: No nausea this AM -reports resolution of yesterday's sensation of "food not going down". No other new problems. Slept well. Feeling OK.  Objective: Vital signs in last 24 hours: Temp:  [97.7 F (36.5 C)] 97.7 F (36.5 C) (03/26 0400) Pulse Rate:  [68-74] 68 (03/26 0400) Resp:  [16] 16 (03/26 0400) BP: (122-124)/(57-72) 122/57 mmHg (03/26 0400) SpO2:  [99 %] 99 % (03/26 0400) Weight:  [53.4 kg (117 lb 11.6 oz)] 53.4 kg (117 lb 11.6 oz) (03/26 0500) Weight change: -3.9 kg (-8 lb 9.6 oz) Last BM Date: 11/07/15  Intake/Output from previous day: 03/25 0701 - 03/26 0700 In: 360 [P.O.:360] Out: 480 [Urine:480]  Physical Exam General: No apparent distress   Lying in bed on left side Lungs: Normal effort. Lungs clear to auscultation, no crackles or wheezes. Cardiovascular: Regular rate and rhythm, no edema Abd: SNTND, +BS Neurological: No new neurological deficits   Lab Results: BMET    Component Value Date/Time   NA 137 11/05/2015 0500   NA 141 07/16/2015 0755   K 4.0 11/05/2015 0500   K 4.4 07/16/2015 0755   CL 104 11/05/2015 0500   CO2 25 11/05/2015 0500   CO2 26 07/16/2015 0755   GLUCOSE 95 11/05/2015 0500   GLUCOSE 87 07/16/2015 0755   BUN 10 11/05/2015 0500   BUN 14.7 07/16/2015 0755   CREATININE 0.47 11/05/2015 0500   CREATININE 0.9 07/16/2015 0755   CREATININE 0.79 09/27/2013 1548   CALCIUM 8.8* 11/05/2015 0500   CALCIUM 9.5 07/16/2015 0755   GFRNONAA >60 11/05/2015 0500   GFRAA >60 11/05/2015 0500   CBC    Component Value Date/Time   WBC 6.1 11/05/2015 0500   WBC 6.0 07/14/2014 1149   RBC 3.82* 11/05/2015 0500   RBC 4.24 07/14/2014 1149   RBC 3.77* 09/03/2013 1425   HGB 10.9* 11/05/2015 0500   HGB 13.3 07/14/2014 1149   HCT 34.9* 11/05/2015 0500   HCT 36.9 01/11/2015 1920   HCT 40.5 07/14/2014 1149   PLT 298 11/05/2015 0500   PLT 220 07/14/2014 1149   MCV 91.4 11/05/2015  0500   MCV 95.5 07/14/2014 1149   MCH 28.5 11/05/2015 0500   MCH 31.4 07/14/2014 1149   MCHC 31.2 11/05/2015 0500   MCHC 32.8 07/14/2014 1149   RDW 16.2* 11/05/2015 0500   RDW 17.3* 07/14/2014 1149   LYMPHSABS 1.0 10/28/2015 0652   LYMPHSABS 1.5 07/14/2014 1149   MONOABS 0.4 10/28/2015 0652   MONOABS 0.4 07/14/2014 1149   EOSABS 0.1 10/28/2015 0652   EOSABS 0.1 07/14/2014 1149   BASOSABS 0.0 10/28/2015 0652   BASOSABS 0.0 07/14/2014 1149   CBG's (last 3):  No results for input(s): GLUCAP in the last 72 hours. LFT's Lab Results  Component Value Date   ALT 20 10/28/2015   AST 23 10/28/2015   ALKPHOS 42 10/28/2015   BILITOT 0.9 10/28/2015    Studies/Results: No results found.  Medications:  I have reviewed the patient's current medications. Scheduled Medications: . antiseptic oral rinse  7 mL Mouth Rinse q12n4p  . atorvastatin  40 mg Oral q1800  . bethanechol  25 mg Oral TID  . chlorhexidine  15 mL Mouth Rinse BID  . enoxaparin (LOVENOX) injection  40 mg Subcutaneous Q24H  . gabapentin  200 mg Oral BID  . gabapentin  300 mg Oral QHS  . Gerhardt's butt cream   Topical QID  . lactose free nutrition  237 mL Oral TID BM  . lidocaine  2 patch Transdermal Q24H  . metoprolol succinate  37.5 mg Oral QHS  . pantoprazole  40 mg Oral Daily  . polyethylene glycol  17 g Oral BID  . QUEtiapine  12.5 mg Oral QHS   PRN Medications: acetaminophen, acetaminophen, alum & mag hydroxide-simeth, bisacodyl, diphenhydrAMINE, guaiFENesin-dextromethorphan, prochlorperazine **OR** prochlorperazine **OR** prochlorperazine, sodium phosphate  Assessment/Plan: Principal Problem:   Quadriplegia and quadriparesis (HCC) Active Problems:   GBS (Guillain-Barre syndrome) (HCC)   Numbness and tingling   Paresthesias   UTI (lower urinary tract infection)   History of colon cancer   Acute blood loss anemia   Upper GI bleed  1. Quadriplegia, numbness, gait instability secondary to  GBS -continue CIR- -continue to provide positive reinforcement 2. DVT Prophylaxis/Anticoagulation: Pharmaceutical: Lovenox -dopplers with LLE peroneal/gastroc dvt---no change in plan at present -re-check doppler prior to discharge to determine future course--will recheck Tues or Wed next week 3. Pain Management: Tylenol prn. Limit narcotics due to cognitive effects  -gabapentin helpful. Increased to 200mg  bid and 300mg  qhs to capture night and early morning pain better -continue TEDS also as these proved beneficial as well 4. Mood: LCSW to follow for evaluation and support.  5. Neuropsych: This patient is not fully capable of making decisions on her own behalf. 6. Skin/Wound Care: Routine pressure relief measures.  7. Fluids/Electrolytes/Nutrition: Monitor I/O.  -need to watch sodium levels--137   -encourage PO.  8. Citrobacter Koseri UTI 3/9: resolved with abx -voiding trial, I/O cath prn.---volumes 100-300cc - urecholine ---increase to 25mg  tid 9. Colon cancer with liver mets: No complaints of pain.  10. UGIB with ABLA: H/H stable and anemia resolving. Continue to monitor for any recurrent signs of bleeding. Hgb 10.9, trending up-- -encourage fluids and ongoing acclimation. 11. Nausea w/o emesis 3/25 - resolved with symptomatic care - taking po well   LOS (Days) 10 Length of stay, days: 12   Valerie A. Asa Lente, MD 11/08/2015, 10:45 AM

## 2015-11-08 NOTE — Plan of Care (Signed)
Problem: SCI BLADDER ELIMINATION Goal: RH STG MANAGE BLADDER WITH EQUIPMENT WITH ASSISTANCE STG Manage Bladder With Equipment With total Assistance  Outcome: Not Progressing  I and O Cath q 8 hours

## 2015-11-08 NOTE — Progress Notes (Signed)
Inspiratory force -50cmH20, VC 1.86L  Very good effort

## 2015-11-08 NOTE — Progress Notes (Signed)
NIF/FVC obtained: NIF-(-55)cmH20/FVC-(1.86)L, good effort, RT to monitor.

## 2015-11-09 ENCOUNTER — Inpatient Hospital Stay (HOSPITAL_COMMUNITY): Payer: Self-pay | Admitting: Speech Pathology

## 2015-11-09 ENCOUNTER — Inpatient Hospital Stay (HOSPITAL_COMMUNITY): Payer: Medicare Other | Admitting: Physical Therapy

## 2015-11-09 ENCOUNTER — Inpatient Hospital Stay (HOSPITAL_COMMUNITY): Payer: Medicare Other | Admitting: *Deleted

## 2015-11-09 ENCOUNTER — Inpatient Hospital Stay (HOSPITAL_COMMUNITY): Payer: Medicare Other

## 2015-11-09 MED ORDER — GABAPENTIN 300 MG PO CAPS
300.0000 mg | ORAL_CAPSULE | Freq: Three times a day (TID) | ORAL | Status: DC
  Administered 2015-11-09 – 2015-11-17 (×24): 300 mg via ORAL
  Filled 2015-11-09 (×24): qty 1

## 2015-11-09 NOTE — Progress Notes (Signed)
Occupational Therapy Note  Patient Details  Name: MIATTA MARAVILLA MRN: [id number] Date of Birth: 1936-05-21  Today's Date: 11/09/2015 OT Individual Time: 1400-1430 OT Individual Time Calculation (min): 30 min   Pt c/o continued tingling in bilateral hands and feet; emotional support Individual Therapy  Pt resting in w/c upon arrival with daughter present.  Pt engaged in table top activity while seated in w/c.  Task involved grasping clothes pins and placing on dowels of various diameters with increasing difficulty. Pt was able to grasp clothes pins with less resistance (3 levels) but was unable to grasp clothes pins with increased resistance.  Pt removed all the clothes pins and placed in container and placed lid on the container.  Pt transitioned to removing lid from container with small foam objects and challenged with picking up foam using pincher grasp only with focus on isolated finger/thumb control/movement.  Pt remained in w/c in partially tilted position with daughter present.    [name] [facility] 11/09/2015, 2:38 PM

## 2015-11-09 NOTE — Progress Notes (Signed)
NIF -55, VC 1.8L. Tolerated well. RT to monitor QS

## 2015-11-09 NOTE — Progress Notes (Signed)
Subjective/Complaints: Morning "tingling" per routine. No other issues this morning. On EOB with OT working on a transfer when I came in.   ROS: Pt denies fever, rash/itching, headache, blurred or double vision, nausea, vomiting, abdominal pain, diarrhea, chest pain, shortness of breath, palpitations, dysuria, dizziness, neck or back pain, bleeding, anxiety, or depression  Objective: Vital Signs: Blood pressure 119/62, pulse 70, temperature 98.2 F (36.8 C), temperature source Oral, resp. rate 18, height 5\' 2"  (1.575 m), weight 48.4 kg (106 lb 11.2 oz), SpO2 97 %. No results found. No results found for this or any previous visit (from the past 72 hour(s)).   HEENT: normal Cardio: RRR Resp: CTA B/L GI: BS positive and NT, ND Extremity:  No Edema Skin:   Intact Neuro: Alert/Oriented, Abnormal Sensory absent LT and Proprio in BLE below knees, and Abnormal Motor 3+ LUE, 3 to 3+/5 RUE, 2- B hip ext KE tr-1,  APF trace--no change. Sensation limited in LT/pain in stocking glove distribution in both legs. Has little sense of gross touch in either foot. Hands/UE's with similar pattern but much less affected----no substantial changes today.  Musc/Skel:  Full ROM both ue and lower ext. No edema Gen Fatigued but NAD Psych: appropriate  Assessment/Plan: 1. 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 Physiatrist is providing close team supervision and 24 hour management of active medical problems listed below. Physiatrist and rehab team continue to assess barriers to discharge/monitor patient progress toward functional and medical goals. FIM: Function - Bathing Bathing activity did not occur: Safety/medical concerns Position: Bed Body parts bathed by patient: Chest Body parts bathed by helper: Right arm, Left arm, Chest, Abdomen, Front perineal area, Buttocks, Right upper leg,  Left upper leg, Right lower leg, Left lower leg, Back (per nursing total assist) Bathing not applicable: Back Assist Level: 2 helpers (per nursing)  Function- Upper Body Dressing/Undressing Upper body dressing/undressing activity did not occur: Refused What is the patient wearing?: Pull over shirt/dress Pull over shirt/dress - Perfomed by patient: Put head through opening Pull over shirt/dress - Perfomed by helper: Pull shirt over trunk, Thread/unthread right sleeve, Thread/unthread left sleeve Assist Level: Touching or steadying assistance(Pt > 75%) Function - Lower Body Dressing/Undressing What is the patient wearing?: Pants, Ted Hose, Non-skid slipper socks, AFO, Underwear Position: Bed Underwear - Performed by helper: Thread/unthread right underwear leg, Thread/unthread left underwear leg, Pull underwear up/down Pants- Performed by helper: Thread/unthread right pants leg, Thread/unthread left pants leg, Pull pants up/down Non-skid slipper socks- Performed by helper: Don/doff right sock, Don/doff left sock Shoes - Performed by helper: Don/doff right shoe, Don/doff left shoe, Fasten right, Fasten left AFO - Performed by helper: Don/doff right AFO, Don/doff left AFO TED Hose - Performed by helper: Don/doff right TED hose, Don/doff left TED hose Assist for footwear: Dependant Assist for lower body dressing: 2 Helpers  Function - Toileting Toileting activity did not occur: No continent bowel/bladder event Toileting steps completed by patient: Adjust clothing prior to toileting, Performs perineal hygiene, Adjust clothing after toileting Toileting steps completed by helper: Adjust clothing prior to toileting, Performs perineal hygiene, Adjust clothing after toileting  Function - Air cabin crew transfer activity did not occur: Safety/medical concerns  Function - Chair/bed transfer Chair/bed transfer activity did not occur: Safety/medical concerns (unable to tolerate sitting  upright EOB ) Chair/bed transfer method: Lateral scoot Chair/bed transfer assist level: 2 helpers Chair/bed transfer assistive device: Sliding  board, Bedrails Mechanical lift: Maximove Chair/bed transfer details: Verbal cues for precautions/safety, Visual cues/gestures for precautions/safety, Tactile cues for sequencing, Visual cues/gestures for sequencing, Verbal cues for sequencing  Function - Locomotion: Wheelchair Will patient use wheelchair at discharge?: Yes Type: Manual (TIS) Wheelchair activity did not occur: Safety/medical concerns (Too fatigued once up in [facility] ) Assist Level: Dependent (Pt equals 0%) Function - Locomotion: Ambulation Ambulation activity did not occur: Safety/medical concerns  Function - Comprehension Comprehension: Auditory Comprehension assist level: Follows complex conversation/direction with no assist  Function - Expression Expression: Verbal Expression assist level: Expresses complex 90% of the time/cues < 10% of the time  Function - Social Interaction Social Interaction assist level: Interacts appropriately with others with medication or extra time (anti-anxiety, antidepressant).  Function - Problem Solving Problem solving assist level: Solves complex problems: With extra time  Function - Memory Memory assist level: Recognizes or recalls 90% of the time/requires cueing < 10% of the time Patient normally able to recall (first 3 days only): Current season  Medical Problem List and Plan: 1.  Quadriplegia, numbness, gait instability secondary to GBS  -continue CIR-  -continuing to provide positive reinforcement 2.  DVT Prophylaxis/Anticoagulation: Pharmaceutical: Lovenox  -dopplers with LLE peroneal/gastroc dvt---no change in plan at present  -re-check doppler prior to discharge to determine future course--will recheck Tues or Wed next week 3. Pain Management: Tylenol prn. Limit narcotics due to cognitive effects     -gabapentin helpful. Increase to  300mg  tid   -continue TEDS also as these proved beneficial as well 4. Mood: LCSW to follow for evaluation and support.   5. Neuropsych: This patient is not fully capable of making decisions on her own behalf. 6. Skin/Wound Care: Routine pressure relief measures.   7. Fluids/Electrolytes/Nutrition: Monitor I/O.   -need to watch sodium levels--137      -encourage PO.   8.  Citrobacter Koseri UTI: treated with ctx  -voiding trial, I/O cath prn.---volumes 100-300cc  -  urecholine -- 25mg  tid  -recheck urine culture   -consider flomax trial (concerned about hypotension however) 9. Colon cancer with liver mets: No complaints of pain.    10. UGIB with ABLA: H/H stable and anemia resolving. Continue to monitor for any recurrent signs of bleeding.   Hgb 10.9, trending up--  -encourage fluids and ongoing acclimation.   LOS (Days) 13 A FACE TO FACE EVALUATION WAS PERFORMED  Cynthia [name] T 11/09/2015, 10:22 AM

## 2015-11-09 NOTE — Progress Notes (Signed)
Occupational Therapy Session Note  Patient Details  Name: ZAKIAH POORMAN MRN: [id number] Date of Birth: [birth year]  Today's Date: 11/09/2015 OT Individual Time: 0800-0900 OT Individual Time Calculation (min): 60 min    Short Term Goals: Week 2:  OT Short Term Goal 1 (Week 2): Pt will perform toilet/drop arm BSC transfer with assistance of 1 person (max A) OT Short Term Goal 2 (Week 2): Pt will sit EOB with mod A for 10 mins in prepearation for performing UB dressing tasks OT Short Term Goal 3 (Week 2): Pt will don pull over shirt with min A in supported sitting OT Short Term Goal 4 (Week 2): Pt will brush teeth with supervision/set up.  Skilled Therapeutic Interventions/Progress Updates:    Pt resting in bed upon arrival.  Pt stated nursing had helped her with donning a clean shirt earlier.  Pt initially engaged in bed mobility to facilitate donning of pants.  Pt rolls to right and left, using bed rails, with min A to position BLE during mobility.  Pt performed supine>sit EOB with tot A + 2 in preparation for sliding board transfer to w/c.  Pt sat EOB approx 5 mins with max A while attempting to use BUE for support.  Pt completed grooming tasks (brushing teeth, brushing hair, and washing face and hands) seated in w/c at sink.  Pt required setup only for grooming tasks.  Pt continues to exhibit decreased coordination but is able to complete tasks without assistance.  Pt transitioned to therapy gym and engaged in BUE tasks seated in w/c.  Activities included grasping and stacking cups while reaching across midline.  Pt also grasped large pegs and placed in peg board.  Pt noted with increased trunk control when reaching across midline and returning to midline.  Focus on activity tolerance, bed mobility, sliding board transfers, BUE use for functional tasks, sitting balance, and safety awareness to increase independence with BADLs.  Therapy Documentation Precautions:  Precautions Precautions:  Fall Precaution Comments: quadriparesis, move BLE very carefully due to pain Restrictions Weight Bearing Restrictions: No   Pain: Pain Assessment Pain Assessment: 0-10 Pain Score: 7  Pain Type: Neuropathic pain Pain Location: Leg Pain Orientation: Right;Left Pain Descriptors / Indicators: Tingling;Numbness;Discomfort Pain Onset: On-going Pain Intervention(s): RN made aware;Repositioned;Emotional support  See Function Navigator for Current Functional Status.   Therapy/Group: Individual Therapy  Leroy Libman 11/09/2015, 9:39 AM

## 2015-11-09 NOTE — Progress Notes (Signed)
VC/NIF obtained: NIF-(-50)cmh20/FVC-(1.6)L, good effort.

## 2015-11-09 NOTE — Progress Notes (Signed)
Physical Therapy Session Note  Patient Details  Name: Cynthia [name] MRN: [id number] Date of Birth: Aug 13, 1936  Today's Date: 11/09/2015 PT Individual Time: 1300-1350 PT Individual Time Calculation (min): 50 min   Short Term Goals: Week 2:  PT Short Term Goal 1 (Week 2): Patient will perform rolling with hospital bed functions with min A. PT Short Term Goal 2 (Week 2): Patient will perform slide board transfers bed <> wheelchair with +2A.  PT Short Term Goal 3 (Week 2): Patient will maintain sitting balance with mod A x 1 minute. PT Short Term Goal 4 (Week 2): Patient will recall pressure relief techniques and call for assistance with mod cues 75% of time.  Skilled Therapeutic Interventions/Progress Updates:   Patient up in [place] wheelchair with daughter present for training. Performed level slide board transfer from wheelchair > mat table with max A and mat table > wheelchair with +2A for assist to place slide board. Patient performed static sitting balance on mat with assist for hand placement posterior to trunk in order to maintain balance with supervision. For upright sitting with hands at side, patient required min-mod A with LOB anterior. Patient transferred sit <> supine on mat table with max-total A and cues for technique. Instructed patient's daughter in supine BLE stretching program with therapist demonstrating and daughter providing return demonstration, patient with continued increased pain LLE > RLE and educated to hold stretches up to 60 sec each, provided handout with pictures. Patient tolerated standing in standing frame x 5 min while discussing previous employment with recreational therapist while PT provided manual facilitation for midline positioning in standing. Patient tolerated well and reported no significant increase in numbness/tingling except in both knees, which she reported was tolerable. Patient left in [place] wheelchair with daughter present.   Therapy  Documentation Precautions:  Precautions Precautions: Fall Precaution Comments: quadriparesis, move BLE very carefully due to pain Restrictions Weight Bearing Restrictions: No Pain:  Unrated neuropathic pain in bilateral lower extremities and hands  See Function Navigator for Current Functional Status.   Therapy/Group: Individual Therapy  Laretta Alstrom 11/09/2015, 3:28 PM

## 2015-11-09 NOTE — Progress Notes (Signed)
Speech Language Pathology Daily Session Note  Patient Details  Name: Cynthia [name] MRN: [id number] Date of Birth: 05/18/1936  Today's Date: 11/09/2015 SLP Individual Time: [id number] SLP Individual Time Calculation (min): 55 min  Short Term Goals: Week 2: SLP Short Term Goal 1 (Week 2): Patient will consume current diet with minimal overt s/s of aspiration with supervision verbal cues for use of swallowing compenstory strategies.  SLP Short Term Goal 2 (Week 2): Patient will demonstrate efficient mastication with complete oral clearance with a self-percieved effort level of 5 or less in regards to fatigue with regular textures with supervision verbal cues  over 2 consecutive sesions prior to upgrade.   Skilled Therapeutic Interventions: Skilled treatment session focused on dysphagia goals. SLP facilitated session by providing supervision verbal cues for use of chin tuck during breakfast meal of Dys. 3 textures with thin liquids, especially with her cereal due to the mixed consistencies. Patient with intermittent overt coughing due to impaired timing of the chin tuck. Patient also consumed a snack of regular textures and demonstrated efficient mastication with minimal oral residue without overt s/s of aspiration but continues to request Dys. 3 textures for energy conservation. Patient self-fed 100% of her meal with Mod I. Patient left upright in wheelchair with all needs within reach and family present. Continue with current plan of care.    Function:  Eating Eating   Modified Consistency Diet: Yes Eating Assist Level: Set up assist for;Supervision or verbal cues   Eating Set Up Assist For: Opening containers;Applying device (includes dentures)       Cognition Comprehension Comprehension assist level: Follows complex conversation/direction with no assist  Expression   Expression assist level: Expresses complex 90% of the time/cues < 10% of the time  Social Interaction Social  Interaction assist level: Interacts appropriately with others with medication or extra time (anti-anxiety, antidepressant).  Problem Solving Problem solving assist level: Solves complex problems: With extra time  Memory Memory assist level: Recognizes or recalls 90% of the time/requires cueing < 10% of the time    Pain No/Denies Pain   Therapy/Group: Individual Therapy  Leslye Puccini 11/09/2015, 10:02 AM

## 2015-11-10 ENCOUNTER — Inpatient Hospital Stay (HOSPITAL_COMMUNITY): Payer: Medicare Other | Admitting: Physical Therapy

## 2015-11-10 ENCOUNTER — Inpatient Hospital Stay (HOSPITAL_COMMUNITY): Payer: Medicare Other | Admitting: Speech Pathology

## 2015-11-10 ENCOUNTER — Inpatient Hospital Stay (HOSPITAL_COMMUNITY): Payer: Medicare Other | Admitting: *Deleted

## 2015-11-10 DIAGNOSIS — R339 Retention of urine, unspecified: Secondary | ICD-10-CM

## 2015-11-10 MED ORDER — LIDOCAINE HCL 2 % EX GEL
CUTANEOUS | Status: DC | PRN
  Administered 2015-11-17: 1 via TOPICAL
  Filled 2015-11-10 (×2): qty 5

## 2015-11-10 MED ORDER — TAMSULOSIN HCL 0.4 MG PO CAPS
0.4000 mg | ORAL_CAPSULE | Freq: Every day | ORAL | Status: DC
  Administered 2015-11-10 – 2015-11-30 (×21): 0.4 mg via ORAL
  Filled 2015-11-10 (×22): qty 1

## 2015-11-10 NOTE — Progress Notes (Signed)
Orthopedic Tech Progress Note Patient Details:  Cynthia [name] 1935-09-07 [id number]  Ortho Devices Type of Ortho Device: Ankle splint Ortho Device/Splint Location: (B) LE Ortho Device/Splint Interventions: Ordered, Application   Braulio Bosch 11/10/2015, 3:18 PM

## 2015-11-10 NOTE — Progress Notes (Signed)
Subjective/Complaints: No changes today. Still with dysesthesias and urine retention. Happy that she stood for 5 minutes yesterday. Able to feed self, drink better with hands/adapt equip  ROS: Pt denies fever, rash/itching, headache, blurred or double vision, nausea, vomiting, abdominal pain, diarrhea, chest pain, shortness of breath, palpitations, dysuria, dizziness, neck or back pain, bleeding, anxiety, or depression  Objective: Vital Signs: Blood pressure 141/76, pulse 74, temperature 98.1 F (36.7 C), temperature source Oral, resp. rate 18, height 5\' 2"  (1.575 m), weight 45.8 kg (100 lb 15.5 oz), SpO2 96 %. No results found. No results found for this or any previous visit (from the past 72 hour(s)).   HEENT: normal Cardio: RRR Resp: CTA B/L GI: BS positive and NT, ND Extremity:  No Edema Skin:   Intact Neuro: Alert/Oriented, Abnormal Sensory absent LT and Proprio in BLE below knees, and Abnormal Motor 3+ LUE, 3 to 3+/5 RUE, 2- B hip ext KE tr-1,  APF trace--no change. Sensation limited in LT/pain in stocking glove distribution in both legs. Has little sense of gross touch in either foot. Hands/UE's with similar pattern but much less affected----no substantial changes today.  Musc/Skel:  Full ROM both ue and lower ext. No edema Gen Fatigued but NAD Psych: appropriate  Assessment/Plan: 1. 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 Physiatrist is providing close team supervision and 24 hour management of active medical problems listed below. Physiatrist and rehab team continue to assess barriers to discharge/monitor patient progress toward functional and medical goals. FIM: Function - Bathing Bathing activity did not occur: Safety/medical concerns Position: Bed Body parts bathed by patient: Chest Body parts bathed by helper: Right arm, Left arm, Chest, Abdomen,  Front perineal area, Buttocks, Right upper leg, Left upper leg, Right lower leg, Left lower leg, Back (per nursing total assist) Bathing not applicable: Back Assist Level: 2 helpers (per nursing)  Function- Upper Body Dressing/Undressing Upper body dressing/undressing activity did not occur: Refused What is the patient wearing?: Pull over shirt/dress Pull over shirt/dress - Perfomed by patient: Put head through opening Pull over shirt/dress - Perfomed by helper: Pull shirt over trunk, Thread/unthread right sleeve, Thread/unthread left sleeve Assist Level: Touching or steadying assistance(Pt > 75%) Function - Lower Body Dressing/Undressing What is the patient wearing?: Pants, Ted Hose, Non-skid slipper socks, AFO, Underwear Position: Bed Underwear - Performed by helper: Thread/unthread right underwear leg, Thread/unthread left underwear leg, Pull underwear up/down Pants- Performed by helper: Thread/unthread right pants leg, Thread/unthread left pants leg, Pull pants up/down Non-skid slipper socks- Performed by helper: Don/doff right sock, Don/doff left sock Shoes - Performed by helper: Don/doff right shoe, Don/doff left shoe, Fasten right, Fasten left AFO - Performed by helper: Don/doff right AFO, Don/doff left AFO TED Hose - Performed by helper: Don/doff right TED hose, Don/doff left TED hose Assist for footwear: Dependant Assist for lower body dressing: 2 Helpers  Function - Toileting Toileting activity did not occur: No continent bowel/bladder event Toileting steps completed by patient: Adjust clothing prior to toileting, Performs perineal hygiene, Adjust clothing after toileting Toileting steps completed by helper: Adjust clothing prior to toileting, Performs perineal hygiene, Adjust clothing after toileting  Function - Air cabin crew transfer activity did not occur: Safety/medical concerns  Function - Chair/bed transfer Chair/bed transfer activity did not occur:  Safety/medical concerns (unable to tolerate sitting upright EOB ) Chair/bed transfer method: Lateral scoot Chair/bed transfer assist level: 2 helpers Chair/bed  transfer assistive device: Armrests, Sliding board Mechanical lift: Maximove Chair/bed transfer details: Verbal cues for precautions/safety, Visual cues/gestures for precautions/safety, Tactile cues for sequencing, Visual cues/gestures for sequencing, Verbal cues for sequencing  Function - Locomotion: Wheelchair Will patient use wheelchair at discharge?: Yes Type: Manual (TIS) Wheelchair activity did not occur: Safety/medical concerns (Too fatigued once up in [facility] ) Assist Level: Dependent (Pt equals 0%) Function - Locomotion: Ambulation Ambulation activity did not occur: Safety/medical concerns  Function - Comprehension Comprehension: Auditory Comprehension assist level: Follows complex conversation/direction with no assist  Function - Expression Expression: Verbal Expression assist level: Expresses complex 90% of the time/cues < 10% of the time  Function - Social Interaction Social Interaction assist level: Interacts appropriately with others with medication or extra time (anti-anxiety, antidepressant).  Function - Problem Solving Problem solving assist level: Solves complex problems: With extra time  Function - Memory Memory assist level: Recognizes or recalls 90% of the time/requires cueing < 10% of the time Patient normally able to recall (first 3 days only): Current season  Medical Problem List and Plan: 1.  Quadriplegia, numbness, gait instability secondary to GBS  -continue CIR-  -team conference today 2.  DVT Prophylaxis/Anticoagulation: Pharmaceutical: Lovenox  -dopplers with LLE peroneal/gastroc dvt---no change in plan at present  -re-check doppler prior to discharge to determine future course--will recheck Tues or Wed next week 3. Pain Management: Tylenol prn. Limit narcotics due to cognitive effects      -gabapentin helpful. Increased to 300mg  tid --titrate further to effect  -continue TEDS also as these proved beneficial as well 4. Mood: LCSW to follow for evaluation and support.   5. Neuropsych: This patient is not fully capable of making decisions on her own behalf. 6. Skin/Wound Care: Routine pressure relief measures.   7. Fluids/Electrolytes/Nutrition: Monitor I/O.   -need to watch sodium levels--137      -encourage PO.   8.  Citrobacter Koseri UTI: treated with ctx  -voiding trial, I/O cath prn.---volumes 100-300cc  -  urecholine -- 25mg  tid  -recheck urine culture (still not collected)  -begin flomax trial (watch bp) 9. Colon cancer with liver mets: No complaints of pain.    10. UGIB with ABLA: H/H stable and anemia resolving. Continue to monitor for any recurrent signs of bleeding.   Hgb 10.9, trending up--  -encourage fluids and ongoing acclimation.   LOS (Days) 14 A FACE TO FACE EVALUATION WAS PERFORMED  Cynthia [name] T 11/10/2015, 9:05 AM

## 2015-11-10 NOTE — Progress Notes (Signed)
NIF -58, VC 1.8L with good effort.

## 2015-11-10 NOTE — Progress Notes (Signed)
Nutrition Follow-up  DOCUMENTATION CODES:   Severe malnutrition in context of acute illness/injury  INTERVENTION:  Continue Boost Plus po TID, each supplement provides 360 kcal and 14 grams of protein.   Encourage adequate PO intake.  NUTRITION DIAGNOSIS:   Malnutrition related to acute illness as evidenced by percent weight loss, moderate depletions of muscle mass; ongoing  GOAL:   Patient will meet greater than or equal to 90% of their needs; met  MONITOR:   PO intake, Supplement acceptance, Weight trends, Labs, I & O's, Skin  REASON FOR ASSESSMENT:   Low Braden    ASSESSMENT:   80 y.o. female with metastatic colon cancer s/p surgery and chemotherapy, recurrent C-diff, HTN, stroke with lleft sided weakness/numbness, DVT who was admitted on 10/06/15 with complaints of dizziness, paraesthesias of right arm and right leg, numbness of hands and feet, BLE weakness and difficulty with walking. She was intubated 02/26- 03/05 due to respiratory insufficiency and was treated for HCAP with Vanc/Zosyn. MRI Cervical/thoracic/lumbar spine showed severe neural foraminal stenosis R-C4, R-C5, B-C6 and L-C7 nerve roots and peripheral enhancement of distal thoracic spinal cord is well nerve roots with anterior and posterior suggestive of GBS  Meal completion has been 50-100% with most intake at 100%. Pt currently has Boost Plus ordered and has been consuming most of them. RD to continue with current orders. Pt encouraged to eat her food at meals and to drink her supplements.   Diet Order:  DIET DYS 3 Room service appropriate?: Yes; Fluid consistency:: Thin  Skin:  Wound (see comment) (Stage I pressure ulcer on coccyx)  Last BM:  3/27  Height:   Ht Readings from Last 1 Encounters:  10/27/15 5' 2" (1.575 m)    Weight:   Wt Readings from Last 1 Encounters:  11/10/15 100 lb 15.5 oz (45.8 kg)    Ideal Body Weight:  50 kg  BMI:  Body mass index is 18.46 kg/(m^2).  Estimated  Nutritional Needs:   Kcal:  1600-1850  Protein:  80-90 grams  Fluid:  1.6 - 1.8 L/day  EDUCATION NEEDS:   No education needs identified at this time  Corrin Parker, MS, RD, LDN Pager # [phone] After hours/ weekend pager # [phone]

## 2015-11-10 NOTE — Progress Notes (Signed)
Speech Language Pathology Daily Session Note  Patient Details  Name: Cynthia [name] MRN: [id number] Date of Birth: 1936-06-21  Today's Date: 11/10/2015 SLP Individual Time: 1500-1530 SLP Individual Time Calculation (min): 30 min  Short Term Goals: Week 2: SLP Short Term Goal 1 (Week 2): Patient will consume current diet with minimal overt s/s of aspiration with supervision verbal cues for use of swallowing compenstory strategies.  SLP Short Term Goal 2 (Week 2): Patient will demonstrate efficient mastication with complete oral clearance with a self-percieved effort level of 5 or less in regards to fatigue with regular textures with supervision verbal cues  over 2 consecutive sesions prior to upgrade.   Skilled Therapeutic Interventions: Skilled treatment session focused on dysphagia goals. SLP facilitated session by providing set-up assist for oral care via the suction toothbrush. Patient consumed trials of thin liquids via straw (single sips) without use of the chin tuck. Patient consumed trials without overt s/s of aspiration, however, recommend patient continue current diet with use of current strategies with trials of thin without the chin tuck with SLP only. Patient verbalized understanding. Patient left supine in bed with all needs within reach and alarm on. Continue with current plan of care.    Function:  Eating Eating   Modified Consistency Diet: Yes Eating Assist Level: Set up assist for;Supervision or verbal cues           Cognition Comprehension Comprehension assist level: Follows complex conversation/direction with no assist  Expression   Expression assist level: Expresses complex 90% of the time/cues < 10% of the time  Social Interaction Social Interaction assist level: Interacts appropriately with others with medication or extra time (anti-anxiety, antidepressant).  Problem Solving Problem solving assist level: Solves complex problems: With extra time  Memory  Memory assist level: Recognizes or recalls 90% of the time/requires cueing < 10% of the time    Pain No/Denies Pain   Therapy/Group: Individual Therapy  [name] 11/10/2015, 3:32 PM

## 2015-11-10 NOTE — Progress Notes (Signed)
Occupational Therapy Session Note  Patient Details  Name: Cynthia [name] MRN: [id number] Date of Birth: 1935-11-29  Today's Date: 11/10/2015 OT Individual Time: 0900-1000 OT Individual Time Calculation (min): 60 min    Short Term Goals: Week 2:  OT Short Term Goal 1 (Week 2): Pt will perform toilet/drop arm BSC transfer with assistance of 1 person (max A) OT Short Term Goal 2 (Week 2): Pt will sit EOB with mod A for 10 mins in prepearation for performing UB dressing tasks OT Short Term Goal 3 (Week 2): Pt will don pull over shirt with min A in supported sitting OT Short Term Goal 4 (Week 2): Pt will brush teeth with supervision/set up.  Skilled Therapeutic Interventions/Progress Updates:    Pt resting in bed upon arrival.  Pt initially engaged in bed mobility including rolling to right and left to facilitate donning of Ted hose and pants.  Pt sat EOB with max A in preparation for SBT to w/c. Pt required tot A + 2 (pt=25%) for transfer.  Pt completed grooming tasks seated in w/c at sink, including brushing teeth, washing face, and brushing hair.  Pt completed all grooming tasks with set up/supervision.  Pt transitioned to therapy gym and engaged in table activities with BUE while seated unsupported.  Pt folded wash cloths and towels in addition to [facility] tasks including grasping and releasing small objects in a controlled manner and with increased precision in placement.   Therapy Documentation Precautions:  Precautions Precautions: Fall Precaution Comments: quadriparesis, move BLE very carefully due to pain Restrictions Weight Bearing Restrictions: No  Pain: Pain Assessment Pt c/o continued tingling in BUE and BLE  See Function Navigator for Current Functional Status.   Therapy/Group: Individual Therapy  Leroy Libman 11/10/2015, 12:14 PM

## 2015-11-10 NOTE — Progress Notes (Signed)
Physical Therapy Session Note  Patient Details  Name: Cynthia [name] MRN: [id number] Date of Birth: May 29, 1936  Today's Date: 11/10/2015 PT Individual Time: 1100-1200 and 1300-1405 PT Individual Time Calculation (min): 60 min and 65 min  Short Term Goals: Week 2:  PT Short Term Goal 1 (Week 2): Patient will perform rolling with hospital bed functions with min A. PT Short Term Goal 2 (Week 2): Patient will perform slide board transfers bed <> wheelchair with +2A.  PT Short Term Goal 3 (Week 2): Patient will maintain sitting balance with mod A x 1 minute. PT Short Term Goal 4 (Week 2): Patient will recall pressure relief techniques and call for assistance with mod cues 75% of time.  Skilled Therapeutic Interventions/Progress Updates:   Treatment 1: Patient in [place] wheelchair with daughter present for session. Patient transported outside in [place] wheelchair to improve mood due to report of having a "rough morning" after being woken up early by staff. Patient attempted standing in standing frame x 2 but unable to tolerate numbness and tingling in BLE. After repositioning in wheelchair, patient and daughter instructed in heel cord and hamstring stretches, and hip IR/hip ER bilaterally in seated position with 60 sec holds. Performed level slide board transfers wheelchair > mat table with max A and mat table > wheelchair with +2A. Sitting edge of mat with feet supported, patient sat statically with assist for positioning BUE posterior for support with supervision. With therapist sitting on physioball behind patient to facilitate upright posture and opening up chest with min guard-max A, patient engaged in multidirectional reaching for bean bags using both hands from recreational therapist while engaging in therapeutic conversation. Patient tolerated well and left sitting in TIS wheelchair to return to room with daughter.   Treatment 2: Patient up in [place] wheelchair with daughter present for session.  Patient performed simulated car transfer to sedan height from wheelchair using slide board with +2A for safety. Patient appeared motivated by being able to complete this task as she reported being unsure how she would get home, stating, "i can do this!" Daughter reported that other family member currently has patient's sedan and she has a Passenger transport manager, discussed getting sedan back from family member prior to discharge for safety and decreased burden of care with transfers. Performed level slide board transfers x 3 during session with max A to +2A. Patient performed static sitting edge of mat with supervision-min A and cues for safe hand placement. From upright wheelchair, patient engaged in reaching task slightly outside [place]. Retrieved manual 18 x 18 wheelchair for patient to trial and adjusted leg rests and arm rest height for improved positioning in chair. Using mirror for visual feedback, patient required mod cues while sitting in chair approx 30 min for midline positioning. Patient able to lock/unlock wheelchair brakes with supervision. Added coban to brakes and theraband to wheelchair rims due to impaired proprioception, sensation, and ataxia in BUE. Patient reporting improved sitting tolerance in manual wheelchair with Roho cushion for pressure relief. Patient transferred back to bed at end of session for nursing care and transferred sit > supine with +2A. Patient left supine in bed with daughter and NT present.   Therapy Documentation Precautions:  Precautions Precautions: Fall Precaution Comments: quadriparesis, move BLE very carefully due to pain Restrictions Weight Bearing Restrictions: No Pain: Pain Assessment Pain Assessment: No/denies pain Pain Score: 0-No pain   See Function Navigator for Current Functional Status.   Therapy/Group: Individual Therapy  Laretta Alstrom 11/10/2015, 12:32  PM  

## 2015-11-11 ENCOUNTER — Inpatient Hospital Stay (HOSPITAL_COMMUNITY): Payer: Medicare Other | Admitting: *Deleted

## 2015-11-11 ENCOUNTER — Inpatient Hospital Stay (HOSPITAL_COMMUNITY): Payer: Medicare Other | Admitting: Speech Pathology

## 2015-11-11 ENCOUNTER — Inpatient Hospital Stay (HOSPITAL_COMMUNITY): Payer: Medicare Other | Admitting: Physical Therapy

## 2015-11-11 DIAGNOSIS — N319 Neuromuscular dysfunction of bladder, unspecified: Secondary | ICD-10-CM

## 2015-11-11 MED ORDER — CAPSAICIN 0.025 % EX CREA
TOPICAL_CREAM | Freq: Two times a day (BID) | CUTANEOUS | Status: DC
  Administered 2015-11-11: 13:00:00 via TOPICAL
  Filled 2015-11-11: qty 56.6

## 2015-11-11 MED ORDER — TRAMADOL HCL 50 MG PO TABS
25.0000 mg | ORAL_TABLET | Freq: Four times a day (QID) | ORAL | Status: DC | PRN
  Administered 2015-11-14 – 2015-11-17 (×4): 25 mg via ORAL
  Filled 2015-11-11 (×4): qty 1

## 2015-11-11 NOTE — Progress Notes (Signed)
Physical Therapy Session Note  Patient Details  Name: Cynthia [name] MRN: [id number] Date of Birth: 11/17/1935  Today's Date: 11/11/2015 PT Individual Time: [id number] PT Individual Time Calculation (min): 8 min   Short Term Goals: Week 3:  PT Short Term Goal 1 (Week 3): Patient will perform slide board transfers bed <> wheelchair with assist of one person consistently. PT Short Term Goal 2 (Week 3): Patient will perform sitting balance with consistent min A.  PT Short Term Goal 3 (Week 3): Patient will initiate wheelchair propulsion. PT Short Term Goal 4 (Week 3): Patient will tolerate standing with appropriate equipment x 10 min.  PT Short Term Goal 5 (Week 3): Patient will recall pressure relief techniques and call for assistance with mod cues 75% of time.  Skilled Therapeutic Interventions/Progress Updates:   Patient R sidelying reporting increased burning pain in bilateral hands and tops of feet. Patient's daughter present and reported that patient waited to ask for pain medication until daughter arrived, continued education provided to patient on advocating for needs and importance of keeping pain controlled. Patient agreeable to desensitization activity but declined any mobility/OOB activity stating she had just gotten comfortable. Patient placed hands in bag of uncooked rice and reported no worsening of pain but stated it was "different, not better." Patient left in bed with daughter present and needs in reach.   Therapy Documentation Precautions:  Precautions Precautions: Fall Precaution Comments: quadriparesis, move BLE very carefully due to pain Restrictions Weight Bearing Restrictions: No General: PT Amount of Missed Time (min): 52 Minutes PT Missed Treatment Reason: Pain Pain: Pain Assessment Pain Assessment: 0-10 Pain Score: 10-Worst pain ever Pain Type: Neuropathic pain Pain Location: Hand Pain Orientation: Right;Left Pain Descriptors / Indicators: Burning;Pins  and needles;Shooting;Sharp;Tingling Pain Onset: On-going Patients Stated Pain Goal: 3 Pain Intervention(s): RN made aware;Emotional support   See Function Navigator for Current Functional Status.   Therapy/Group: Individual Therapy  Cynthia [name] 11/11/2015, 11:25 AM

## 2015-11-11 NOTE — Patient Care Conference (Signed)
Inpatient RehabilitationTeam Conference and Plan of Care Update Date: 11/10/2015   Time: 2:30 PM    Patient Name: Cynthia [name]      Medical Record Number: [id number]  Date of Birth: 01/13/1936 Sex: Female         Room/Bed: 4W20C/4W20C-01 Payor Info: Payor: [employer] park [employer] MEDICARE / Plan: [facility] MEDICARE / Product Type: *No Product type* /    Admitting Diagnosis: GBS  Admit Date/Time:  10/27/2015  3:35 PM Admission Comments: No comment available   Primary Diagnosis:  Quadriplegia and quadriparesis ([place]) Principal Problem: Quadriplegia and quadriparesis ([place])  Patient Active Problem List   Diagnosis Date Noted  . Quadriplegia and quadriparesis ([place])   . Numbness and tingling   . Paresthesias   . UTI (lower urinary tract infection)   . History of colon cancer   . Acute blood loss anemia   . Upper GI bleed   . GBS (Guillain-Barre syndrome) ([place])   . Aspiration into airway   . Endotracheally intubated   . Acute respiratory failure ([place])   . Shock ([place]) 10/09/2015  . Hemoptysis   . Transverse myelitis ([place])   . Peripheral neuropathy ([place]) 10/06/2015  . Paresthesia of both hands 10/06/2015  . Paresthesia of both feet 10/06/2015  . Cerebral embolism with cerebral infarction 10/06/2015  . Ataxia   . Colon carcinoma metastatic to liver ([place]) 09/25/2015  . Metastasis to liver ([place]) 08/28/2015  . Colon cancer metastasized to liver ([place])   . Liver lesion   . Cerebral infarction due to unspecified mechanism 01/29/2015  . B12 deficiency 01/29/2015  . Essential hypertension 01/29/2015  . Arm numbness left   . Disorientation   . Numbness and tingling of left side of face   . TIA (transient ischemic attack) 01/11/2015  . Confusion   . Left arm numbness   . DVT, lower extremity, distal ([place]) 10/27/2013  . T4a, N0 09/27/2013  . Iron deficiency anemia 09/04/2013  . Symptomatic anemia 09/03/2013  . Orthostasis 09/03/2013  . Near syncope 09/03/2013    Expected Discharge Date:  Expected Discharge Date: 11/24/15  Team Members Present: Physician leading conference: Dr. Alger Simons Social Worker Present: Lennart Pall, LCSW Nurse Present: Heather Roberts, RN PT Present: Carney Living, PT OT Present: Roanna Epley, [place], OT SLP Present: Weston Anna, SLP PPS Coordinator present : Daiva Nakayama, RN, CRRN     Current Status/Progress Goal Weekly Team Focus  Medical   making gradual progress, pt frustrated at times. dysesthesias still can be debilitating. retaining urine  see prior, improve functional limb use  pain control, adjusting bladder regimen, reinforcing need for hydration and nutrition   Bowel/Bladder   Patient incontinent bowel/bladder, requiring I/O cath with low output.   Patient to be continent with bowel and bladdre with time toileting  Continue I/O cath and monitor need for bowel and bladdre program   Swallow/Nutrition/ Hydration   Dys. 3 textures with thin liquids for energy conservation. May have snacks of regular textures. Min A for use of swallowing strategies   Supervision  tolerance of curent diet, RMT testing, trials of thin without chin tuck    ADL's   night bath; UB dressing-min A; LB dressing-tot A; functional transfers tot A + 2 (SBT); sitting balance-min/mod A; grooming-setup; improved functional use of BUE  mod A overall except UB dressing at supervision  activity tolerance, bed mobility, sitting balance, functional transfers, BADL retraining, family education   Mobility   max-total A sit <> supine, mod A rolling, max  to +2A sliding board transfers, min-mod A static sitting balance   min A sitting balance and bed mobility, mod A transfers   bed mobility, transfers, sitting balance, standing tolerance with equipment, activity tolerance, stretching program, seating and positioning, pt/family education   Communication             Safety/Cognition/ Behavioral Observations            Pain   complained of back and generalzed pain  with tingling both upper and lower extremities  <3  assess and medicate for pain Q shift and as needed   Skin   Stage 1 at sacrum, MASD buttocks, Heels red with foam and prafos. Rash to back and face  Patient skin remain free of futher breakdown/inferction while in [facility]  Monitor skin Q shift and as needed and reposition per protocol    Rehab Goals Patient on target to meet rehab goals: Yes *See Care Plan and progress notes for long and short-term goals.  Barriers to Discharge: severity of neurological deficits will mean longer course    Possible Resolutions to Barriers:  continued analgesia, adaptive techniques, education    Discharge Planning/Teaching Needs:  Plan home with daughter as primary caregiver and able to provide 24/7 assistance along with help from daughter-in-law  Teaching still to be scheduled   Team Discussion:  Continues to have foot pain;  Working on bladder - on urecholine.  May need i/o caths still at d/c.  Has had two very good days and up in regular w/c.  Increased UE fxn and using hands for feeding and grooming.  ST following for swallowing.  Need to continue to encourage her to be her own advocate.  Discussed proper positioning in bed to be shared across tx team.  Plan to change to bil aircasts that will allow her to wear shoes.  Still with mod assist w/c level goals.  Revisions to Treatment Plan:  None   Continued Need for Acute Rehabilitation Level of Care: The patient requires daily medical management by a physician with specialized training in physical medicine and rehabilitation for the following conditions: Daily direction of a multidisciplinary physical rehabilitation program to ensure safe treatment while eliciting the highest outcome that is of practical value to the patient.: Yes Daily medical management of patient stability for increased activity during participation in an intensive rehabilitation regime.: Yes Daily analysis of laboratory values and/or  radiology reports with any subsequent need for medication adjustment of medical intervention for : Blood pressure problems;Neurological problems;Nutritional problems  Riyanshi Wahab 11/11/2015, 3:40 PM

## 2015-11-11 NOTE — Progress Notes (Signed)
Recreational Therapy Assessment and Plan  Patient Details  Name: Cynthia [name] MRN: [id number] Date of Birth: July 22, 1936 Today's Date: 11/11/2015  Rehab Potential: Good ELOS: 3 weeks   Assessment Clinical Impression: Problem List:  Patient Active Problem List   Diagnosis Date Noted  . Quadriplegia and quadriparesis ([place])   . Numbness and tingling   . Paresthesias   . UTI (lower urinary tract infection)   . History of colon cancer   . Acute blood loss anemia   . Upper GI bleed   . GBS (Guillain-Barre syndrome) ([place])   . Aspiration into airway   . Endotracheally intubated   . Acute respiratory failure ([place])   . Shock ([place]) 10/09/2015  . Hemoptysis   . Transverse myelitis ([place])   . Peripheral neuropathy ([place]) 10/06/2015  . Paresthesia of both hands 10/06/2015  . Paresthesia of both feet 10/06/2015  . Cerebral embolism with cerebral infarction 10/06/2015  . Ataxia   . Colon carcinoma metastatic to liver ([place]) 09/25/2015  . Metastasis to liver ([place]) 08/28/2015  . Colon cancer metastasized to liver ([place])   . Liver lesion   . Cerebral infarction due to unspecified mechanism 01/29/2015  . B12 deficiency 01/29/2015  . Essential hypertension 01/29/2015  . Arm numbness left   . Disorientation   . Numbness and tingling of left side of face   . TIA (transient ischemic attack) 01/11/2015  . Confusion   . Left arm numbness   . DVT, lower extremity, distal ([place]) 10/27/2013  . T4a, N0 09/27/2013  . Iron deficiency anemia 09/04/2013  . Symptomatic anemia 09/03/2013  . Orthostasis 09/03/2013  . Near syncope 09/03/2013    Past Medical History:  Past Medical History  Diagnosis Date  . Anemia   . Blood transfusion without reported diagnosis jan 2015  . Heart murmur   . Hypertension     no bp meds   . Liver cyst   . C. difficile  colitis 03/05/14, 03/22/14, 04/05/14    recurrent  . Stroke ([place]) 12/2014    NUMBNESS ON LEFT SIDE  . Cervical cancer ([place]) 1972  . Colon cancer ([place]) JAN 2015  . Colon cancer ([place])   . Cancer ([place])     liver  . Complication of anesthesia     slow to awaken in past, DONE WELL RECENTLY  . Tinnitus of both ears ALL THE TIME  . DVT (deep venous thrombosis) ([place]) 10/24/13    Right leg   Past Surgical History:  Past Surgical History  Procedure Laterality Date  . Back surgery  1981    lower lumbar  . Tubal ligation  1968  . Laparoscopic right colectomy Right 10/22/2013    Procedure: LAPAROSCOPIC RIGHT COLECTOMY ; Surgeon: [name], MD; Location: [facility] ORS; Service: General; Laterality: Right;  . Salpingoophorectomy  10/22/2013    Procedure: Mar[place] Saa; Surgeon: [name]. [name], MD; Location: [facility] ORS; Service: Gynecology;;  . Abdominal hysterectomy      vaginal- partial  . Esophagogastroduodenoscopy N/A 10/14/2015    Procedure: ESOPHAGOGASTRODUODENOSCOPY (EGD); Surgeon: Clarene Essex, MD; Location: [facility] ENDOSCOPY; Service: Endoscopy; Laterality: N/A; Bedside in ICU    Assessment & Plan Clinical Impression: Patient is a 80 y.o. year old female with metastatic colon cancer s/p surgery and chemotherapy, recurrent C-diff, HTN, stroke with lleft sided weakness/numbness, DVT who was admitted on 10/06/15 with complaints of dizziness, paraesthesias of right arm and right leg, numbness of hands and feet, BLE weakness and difficulty with walking. She underwent thermal ablation  of liver metastatic lesion on 09/25/15 and has been having low grade fevers PTA. MRI brain negative for stroke.  She developed hypotension with hypotension and large amount of coffee ground emesis on 02/24 and Gi consulted for input. Dr. Paulita Fujita felt symptoms due to esophagitis/gastritis from plavix and recommended serial  H/H and hydration. She was intubated 02/26- 03/05 due to respiratory insufficiency and was treated for HCAP with Vanc/Zosyn. MRI Cervical/thoracic/lumbar spine showed severe neural foraminal stenosis R-C4, R-C5, B-C6 and L-C7 nerve roots and peripheral enhancement of distal thoracic spinal cord is well nerve roots with anterior and posterior suggestive of GBS. Due to areflexia of BLE and hypoactive UE reflexes she was treated with IVIG X 5 doses. She has had visual and tactile hallucinations felt to be due to opiates. She underwent EGD 03/1 showing question of proximal linear ulcer question from NGT and she was cleared to resume blood thinners. BUE strength has been improving and respiratory status stable. FEES 03/6 Showed severe dysphagia and patient kept NPO with tube feeds for nutritional support. Repeat MBS done 03/10 and she was started on She continues to have intermittent hallucinations/confusion question due to Citrobacter UTI and was started on Ceftriaxone 3/9. Had MS changes with hypoxia and lethargy therefore EEG done 03/11 showing mild generalized slowing likely due to metabolic or toxic encephalopathy v/s degenerative central nervous disorders. Psychiatry consulted for input and medications adjusted. She continues to be limited by motor weakness, ongoing issues with insomnia and confusion, poor po intake as well as pain and fatigue. CIR recommended for follow up therapy and patient admitted today. Patient transferred to CIR on 10/27/2015 .       Pt presents with decreased activity tolerance, decreased functional mobility, decreased balance, decreased coordination, decreased safety awareness limiting pt's independence with leisure/community pursuits.  Plan Rec Therapy Plan Is patient appropriate for Therapeutic Recreation?: Yes Rehab Potential: Good Treatment times per week: Min 1 time per week >20 minutes Estimated Length of Stay: 3 weeks TR Treatment/Interventions: Adaptive  equipment instruction;1:1 session;Balance/vestibular training;Functional mobility training;Community reintegration;Patient/family education;Therapeutic activities;Recreation/leisure participation;Therapeutic exercise;UE/LE Coordination activities;Wheelchair propulsion/positioning  Discharge Criteria: Patient will be discharged from TR if patient refuses treatment 3 consecutive times without medical reason.  If treatment goals not met, if there is a change in medical status, if patient makes no progress towards goals or if patient is discharged from hospital.  The above assessment, treatment plan, treatment alternatives and goals were discussed and mutually agreed upon: by patient  [place] 11/11/2015, 12:36 PM

## 2015-11-11 NOTE — Progress Notes (Signed)
Occupational Therapy Session Note  Patient Details  Name: CHRISTYANN SHAKE MRN: [id number] Date of Birth: 30-May-1936  Today's Date: 11/11/2015 OT Individual Time: 0700-0757 OT Individual Time Calculation (min): 57 min    Short Term Goals: Week 2:  OT Short Term Goal 1 (Week 2): Pt will perform toilet/drop arm BSC transfer with assistance of 1 person (max A) OT Short Term Goal 2 (Week 2): Pt will sit EOB with mod A for 10 mins in prepearation for performing UB dressing tasks OT Short Term Goal 3 (Week 2): Pt will don pull over shirt with min A in supported sitting OT Short Term Goal 4 (Week 2): Pt will brush teeth with supervision/set up.  Skilled Therapeutic Interventions/Progress Updates:    Pt resting in bed upon arrival.  Pt engaged in BADL retraining including LB dressing at bed level, UB dressing seated in w/c at sink, and grooming tasks while seated in w/c at sink. Pt requires min A for rolling right and left using bed rails to facilitate pulling pants over hips.  Pt rolled onto side with min A in preparation for sitting EOB.  Pt was able to initiate bringing BLE to EOB but required assistance to place over EOB.  Pt required mod A for supine>sit EOB.  Pt required steady A with occasional close supervision for static sitting balance for approx 30 seconds.  Pt required tot A + 2 for SBT bed>w/c.  Pt was able to don pull over shirt with supervision this morning.  Pt completed grooming tasks (washing face, brushing teeth, brushing hair) with supervision/setup this morning.  Pt noted with increased sitting balance while at sink.  Pt remained in w/c with all needs within reach.  Focus on BADL retraining, bed mobility, sitting balance, functional transfers, increased BUE use, and activity tolerance to increase independence with BADLs and decrease burden of care.  Therapy Documentation Precautions:  Precautions Precautions: Fall Precaution Comments: quadriparesis, move BLE very carefully due  to pain Restrictions Weight Bearing Restrictions: No Pain:  Pt c/o ongoing tingling in BUE and BLE; RN and MD aware, repositioned, emotional support  See Function Navigator for Current Functional Status.   Therapy/Group: Individual Therapy  Leroy Libman 11/11/2015, 9:02 AM

## 2015-11-11 NOTE — Progress Notes (Signed)
Speech Language Pathology Weekly Progress and Session Note  Patient Details  Name: Cynthia [name] MRN: [id number] Date of Birth: 02/14/1936  Beginning of progress report period: November 04, 2015 End of progress report period: November 11, 2015  Today's Date: 11/11/2015 SLP Individual Time: 0900-0920 SLP Individual Time Calculation (min): 20 min  Missed Time: 10 minutes due to pain and fatigue   Short Term Goals: Week 2: SLP Short Term Goal 1 (Week 2): Patient will consume current diet with minimal overt s/s of aspiration with supervision verbal cues for use of swallowing compenstory strategies.  SLP Short Term Goal 1 - Progress (Week 2): Met SLP Short Term Goal 2 (Week 2): Patient will demonstrate efficient mastication with complete oral clearance with a self-percieved effort level of 5 or less in regards to fatigue with regular textures with supervision verbal cues  over 2 consecutive sesions prior to upgrade.  SLP Short Term Goal 2 - Progress (Week 2): Not met    New Short Term Goals: Week 3: SLP Short Term Goal 1 (Week 3): Patient will demonstrate efficient mastication with complete oral clearance with a self-percieved effort level of 5 or less in regards to fatigue with regular textures with supervision verbal cues  over 2 consecutive sesions prior to upgrade.  SLP Short Term Goal 2 (Week 3): Patient will consume current diet with minimal overt s/s of aspiration with Mod I for use of swallowing compenstory strategies.  SLP Short Term Goal 3 (Week 3): Patient will consume trials of thin liquids without the use of a chin tuck without overt s/s of aspiration over 3 consecutive sessions prior to upgrade.   Weekly Progress Updates: Patient has made functional gains and has met 1 of 2 STG's this reporting period due to improved swallowing function. Currently, patient is consuming Dys. 3 textures with thin liquids without overt s/s of aspiration with intermittent supervision cues needed for use  of swallowing compensatory strategies. Patient is also consuming snacks of regular textures but continues on a Dys. 3 diet per her preference for energy conservation. Patient is also consuming trials of thin liquids without use of a chin tuck but continues to demonstrate intermittent overt s/s of aspiration, therefore, recommend patient continue the use of the chin tuck with all solids/liquids. Patient would benefit from continued skilled SLP intervention to maximize her swallowing function and overall functional independence prior to discharge home.    Intensity: Minumum of 1-2 x/day, 30 to 90 minutes Frequency: 1 to 3 out of 7 days Duration/Length of Stay: 11/24/15 Treatment/Interventions: Dysphagia/aspiration precaution training;Patient/family education   Daily Session  Skilled Therapeutic Interventions:  Skilled treatment session focused on dysphagia goals. Upon arrival, patient was sitting upright in the wheelchair and reported multiple pain sites and requested to return to supine in bed due to reports of, "feeling like I am sitting on a rock." Patient declined trials multiple times throughout the session due to pain and fatigue. SLP facilitated session by utilizing the [facility] to transfer the patient back to bed and was repositioned on her left side for pressure relief. Patient was able to direct her care throughout the transfer and repositioning in bed with Mod I. Patient left with all needs within reach and RN made aware of pain. Continue with current plan of care.       Function:   Eating Eating   Modified Consistency Diet: Yes Eating Assist Level: Set up assist for;Supervision or verbal cues   Eating Set Up Assist For: Opening containers;Applying device (  includes dentures)       Cognition Comprehension Comprehension assist level: Follows complex conversation/direction with no assist  Expression   Expression assist level: Expresses complex 90% of the time/cues < 10% of the time   Social Interaction Social Interaction assist level: Interacts appropriately with others with medication or extra time (anti-anxiety, antidepressant).  Problem Solving Problem solving assist level: Solves complex problems: With extra time  Memory Memory assist level: Recognizes or recalls 90% of the time/requires cueing < 10% of the time   Pain Patient unable to rate, patient repositioned and premedicated   Therapy/Group: Individual Therapy  Anakaren Campion 11/11/2015, 4:48 PM

## 2015-11-11 NOTE — Progress Notes (Signed)
Physical Therapy Session Note  Patient Details  Name: Cynthia [name] MRN: [id number] Date of Birth: [birth year]  Today's Date: 11/11/2015 PT Individual Time: 1530-1600 PT Individual Time Calculation (min): 30 min   Short Term Goals: Week 3:  PT Short Term Goal 1 (Week 3): Patient will perform slide board transfers bed <> wheelchair with assist of one person consistently. PT Short Term Goal 2 (Week 3): Patient will perform sitting balance with consistent min A.  PT Short Term Goal 3 (Week 3): Patient will initiate wheelchair propulsion. PT Short Term Goal 4 (Week 3): Patient will tolerate standing with appropriate equipment x 10 min.  PT Short Term Goal 5 (Week 3): Patient will recall pressure relief techniques and call for assistance with mod cues 75% of time.  Skilled Therapeutic Interventions/Progress Updates:   Pt received in bed in sidelying.  Pt reports nursing staff just returned her to bed and positioned her on her side for pressure relief.  Pt requesting to stay in bed due to exertion of energy to transfer OOB and requested to perform LE exercises in bed.  Began with bilat LE ankle and hip stretches for ROM and pain management.  Also performed 12 reps each side: heel slides, hip ABD/ADD and isometric resisted hip/knee extension with focus on hip stabilization, motor control and coordination with each exercise.  At the end of the session the pt was positioned in sidelying for pressure relief and left with all items within reach.  Therapy Documentation Precautions:  Precautions Precautions: Fall Precaution Comments: quadriparesis, move BLE very carefully due to pain Restrictions Weight Bearing Restrictions: No Vital Signs: Therapy Vitals Temp: 97.9 F (36.6 C) Temp Source: Oral Pulse Rate: 82 Resp: 18 BP: 103/61 mmHg Patient Position (if appropriate): Sitting Oxygen Therapy SpO2: 100 % O2 Device: Not Delivered Pain:  No c/o pain at rest.  See Function Navigator for  Current Functional Status.   Therapy/Group: Individual Therapy  Raylene Everts [facility] 11/11/2015, 4:23 PM

## 2015-11-11 NOTE — Progress Notes (Signed)
Physical Therapy Weekly Progress Note  Patient Details  Name: Cynthia [name] MRN: [id number] Date of Birth: 25-Jan-1936  Beginning of progress report period: November 04, 2015 End of progress report period: November 11, 2015  Today's Date: 11/11/2015 PT Individual Time: 1300-1335  PT Individual Time Calculation (min): 35 min   Patient has met 3 of 4 short term goals. Patient continues to make slow steady progress with postural control, coordination, sitting balance, progression from maximove to slide board transfers, switching out of TIS wheelchair to regular manual wheelchair, and initiating standing frame. Patient experiencing increased neuropathic pain this date but demonstrates overall improved tolerance to mobility training. Patient's daughter trained in performing BLE stretching program for patient and has been present to observe more sessions this reporting period.   Patient continues to demonstrate the following deficits: quadriparesis, muscle weakness, neuropathic pain, muscle joint tightness, decreased cardiorespiratory endurance, decreased proprioception, impaired sensation, impaired timing and sequencing, abnormal tone, unbalanced muscle activation, decreased coordination, decreased memory, decreased sitting balance, decreased postural control, and decreased balance strategies and therefore will continue to benefit from skilled PT intervention to enhance overall performance with activity tolerance, balance, postural control, ability to compensate for deficits, functional use of  right upper extremity, right lower extremity, left upper extremity and left lower extremity and coordination.  Patient not progressing toward long term goals.  See goal revision.  Plan of care revisions: Bed mobility downgraded to max A.  PT Short Term Goals Week 2:  PT Short Term Goal 1 (Week 2): Patient will perform rolling with hospital bed functions with min A. PT Short Term Goal 1 - Progress (Week 2):  Met PT Short Term Goal 2 (Week 2): Patient will perform slide board transfers bed <> wheelchair with +2A.  PT Short Term Goal 2 - Progress (Week 2): Met PT Short Term Goal 3 (Week 2): Patient will maintain sitting balance with mod A x 1 minute. PT Short Term Goal 3 - Progress (Week 2): Met PT Short Term Goal 4 (Week 2): Patient will recall pressure relief techniques and call for assistance with mod cues 75% of time. PT Short Term Goal 4 - Progress (Week 2): Not met Week 3:  PT Short Term Goal 1 (Week 3): Patient will perform slide board transfers bed <> wheelchair with assist of one person consistently. PT Short Term Goal 2 (Week 3): Patient will perform sitting balance with consistent min A.  PT Short Term Goal 3 (Week 3): Patient will initiate wheelchair propulsion. PT Short Term Goal 4 (Week 3): Patient will tolerate standing with appropriate equipment x 10 min.  PT Short Term Goal 5 (Week 3): Patient will recall pressure relief techniques and call for assistance with mod cues 75% of time.  Skilled Therapeutic Interventions/Progress Updates:   Patient in bed upon arrival, RN departing after applying cream to hands and feet and patient reported slight improvement in pain. Donned socks, B aircasts, and shoes with total A. Patient transferred to edge of bed with max A with HOB elevated and use of rail, assist for bringing BLE off edge of bed and patient assisting with elevating trunk with cues for technique and hand placement. Patient performed slide board transfer bed > wheelchair with +2A for safety, HOH assist for safe hand placement. Patient instructed in wheelchair propulsion in controlled environment using BUE with hospital gloves donned, requiring max faded to min cues for sequencing and safety and HOH assist for L hand due to decreased coordination/proprioception/sensation and good control of movement  noted in RUE x 150 ft with rest breaks as needed due to fatigue and decreased frustration  tolerance. Patient able to turn to L x 2 and correct path using RUE only. Patient left sitting in wheelchair with soft call bell in lap and daughter present.    Therapy Documentation Precautions:  Precautions Precautions: Fall Precaution Comments: quadriparesis, move BLE very carefully due to pain Restrictions Weight Bearing Restrictions: No Pain: Pain Assessment Pain Assessment: 0-10 Pain Score: 10-Worst pain ever Pain Type: Neuropathic pain Pain Location: Hand Pain Orientation: Right;Left Pain Descriptors / Indicators: Burning;Pins and needles;Shooting;Sharp;Tingling Pain Onset: On-going Patients Stated Pain Goal: 3 Pain Intervention(s): RN made aware;Emotional support   See Function Navigator for Current Functional Status.  Therapy/Group: Individual Therapy  Laretta Alstrom 11/11/2015, 2:09 PM

## 2015-11-11 NOTE — Progress Notes (Signed)
Subjective/Complaints: Up in standing frame yesterday. Slept ok. Continued dysesthesias. Doing more with hands  ROS: Pt denies fever, rash/itching, headache, blurred or double vision, nausea, vomiting, abdominal pain, diarrhea, chest pain, shortness of breath, palpitations, dysuria, dizziness, neck or back pain, bleeding, anxiety, or depression  Objective: Vital Signs: Blood pressure 121/62, pulse 81, temperature 98 F (36.7 C), temperature source Oral, resp. rate 18, height 5\' 2"  (1.575 m), weight 48.9 kg (107 lb 12.9 oz), SpO2 97 %. No results found. No results found for this or any previous visit (from the past 72 hour(s)).   HEENT: normal Cardio: RRR Resp: CTA B/L GI: BS positive and NT, ND Extremity:  No Edema Skin:   Intact Neuro: Alert/Oriented, Abnormal Sensory absent LT and Proprio in BLE below knees, and Abnormal Motor 3+ to 4 LUE, 3 to 4-/5 RUE----improving grip, 2- B hip ext KE tr-1,  APF trace--no change. Sensation limited in LT/pain in stocking glove distribution in both legs. Has little sense of gross touch in either foot. Hands/UE's with similar pattern but much less affected   Musc/Skel:  Full ROM both ue and lower ext. No edema Gen Fatigued but NAD Psych: appropriate  Assessment/Plan: 1. 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 Physiatrist is providing close team supervision and 24 hour management of active medical problems listed below. Physiatrist and rehab team continue to assess barriers to discharge/monitor patient progress toward functional and medical goals. FIM: Function - Bathing Bathing activity did not occur: Safety/medical concerns Position: Bed Body parts bathed by patient: Chest Body parts bathed by helper: Right arm, Left arm, Chest, Abdomen, Front perineal area, Buttocks, Right upper leg, Left upper leg, Right lower leg, Left  lower leg, Back (per nursing total assist) Bathing not applicable: Back Assist Level: 2 helpers (per nursing)  Function- Upper Body Dressing/Undressing Upper body dressing/undressing activity did not occur: Refused What is the patient wearing?: Pull over shirt/dress Pull over shirt/dress - Perfomed by patient: Thread/unthread right sleeve, Thread/unthread left sleeve, Put head through opening, Pull shirt over trunk Pull over shirt/dress - Perfomed by helper: Pull shirt over trunk, Thread/unthread right sleeve, Thread/unthread left sleeve Assist Level: Supervision or verbal cues Function - Lower Body Dressing/Undressing What is the patient wearing?: Underwear, Pants, Ted Hose, Non-skid slipper socks Position: Bed Underwear - Performed by helper: Thread/unthread right underwear leg, Thread/unthread left underwear leg, Pull underwear up/down Pants- Performed by helper: Thread/unthread right pants leg, Thread/unthread left pants leg, Pull pants up/down Non-skid slipper socks- Performed by helper: Don/doff right sock, Don/doff left sock Shoes - Performed by helper: Don/doff right shoe, Don/doff left shoe, Fasten right, Fasten left AFO - Performed by helper: Don/doff right AFO, Don/doff left AFO TED Hose - Performed by helper: Don/doff right TED hose, Don/doff left TED hose Assist for footwear: Dependant Assist for lower body dressing: 2 Helpers  Function - Toileting Toileting activity did not occur: No continent bowel/bladder event Toileting steps completed by patient: Adjust clothing prior to toileting, Performs perineal hygiene, Adjust clothing after toileting Toileting steps completed by helper: Adjust clothing prior to toileting, Performs perineal hygiene, Adjust clothing after toileting  Function - Air cabin crew transfer activity did not occur: Safety/medical concerns  Function - Chair/bed transfer Chair/bed transfer activity did not occur: Safety/medical concerns (unable to  tolerate sitting upright EOB ) Chair/bed transfer method: Lateral scoot Chair/bed transfer assist level: 2 helpers Chair/bed transfer assistive device: Armrests, Sliding  board Mechanical lift: Maximove Chair/bed transfer details: Verbal cues for precautions/safety, Visual cues/gestures for precautions/safety, Tactile cues for sequencing, Visual cues/gestures for sequencing, Verbal cues for sequencing  Function - Locomotion: Wheelchair Will patient use wheelchair at discharge?: Yes Type: Manual (TIS) Wheelchair activity did not occur: Safety/medical concerns (Too fatigued once up in [facility] ) Assist Level: Dependent (Pt equals 0%) Function - Locomotion: Ambulation Ambulation activity did not occur: Safety/medical concerns  Function - Comprehension Comprehension: Auditory Comprehension assist level: Follows complex conversation/direction with no assist  Function - Expression Expression: Verbal Expression assist level: Expresses complex 90% of the time/cues < 10% of the time  Function - Social Interaction Social Interaction assist level: Interacts appropriately with others with medication or extra time (anti-anxiety, antidepressant).  Function - Problem Solving Problem solving assist level: Solves complex problems: With extra time  Function - Memory Memory assist level: Recognizes or recalls 90% of the time/requires cueing < 10% of the time Patient normally able to recall (first 3 days only): Current season  Medical Problem List and Plan: 1.  Quadriplegia, numbness, gait instability secondary to GBS  -continue CIR-  -team conference today 2.  DVT Prophylaxis/Anticoagulation: Pharmaceutical: Lovenox  -dopplers with LLE peroneal/gastroc dvt---no change in plan at present  -re-check doppler prior to discharge to determine future course--will recheck Tues or Wed next week 3. Pain Management: Tylenol prn. Limit narcotics due to cognitive effects     -gabapentin helpful. Increased to 300mg   tid --titrate further to effect  -continue TEDS also as these proved beneficial as well 4. Mood: LCSW to follow for evaluation and support.   5. Neuropsych: This patient is not fully capable of making decisions on her own behalf. 6. Skin/Wound Care: Routine pressure relief measures.   7. Fluids/Electrolytes/Nutrition: Monitor I/O.   -need to watch sodium levels--137      -encourage PO.   8.  Citrobacter Koseri UTI: treated with ctx  -still retaining. Requiring I/O caths  -  urecholine -- 25mg  tid  -recheck urine culture (still not collected)  -  flomax trial   9. Colon cancer with liver mets: No complaints of pain.    10. UGIB with ABLA: H/H stable and anemia resolving. Continue to monitor for any recurrent signs of bleeding.   Hgb 10.9, trending up--  -encourage fluids and ongoing acclimation.   LOS (Days) 15 A FACE TO FACE EVALUATION WAS PERFORMED  Frederick Marro T 11/11/2015, 9:14 AM

## 2015-11-11 NOTE — Progress Notes (Signed)
Physical Therapy Note  Patient Details  Name: Cynthia [name] MRN: [id number] Date of Birth: 02/01/1936 Today's Date: 11/11/2015  Patient in L sidelying, attempted to see patient for make up session. Patient declined, c/o increased R LE pain and requested to be repositioned. Patient left supine in bed with soft call bell on chest and bed alarm on. Will f/u per POC.   Laretta Alstrom 11/11/2015, 4:58 PM

## 2015-11-11 NOTE — Progress Notes (Signed)
Social Work Patient ID: Cynthia [name], female   DOB: 01/10/1936, 80 y.o.   MRN: [id number]   Met with pt this afternoon to review team conference.  Daughter already gone for the day.  Pt agrees that she has had a couple of "very good" days this week but with increased pain today which is very frustrating to her.  Per nursing and pt confirms that she has had an emotional day today and feels "like it's taking so long (recovery)...afraid it won't get better..".  Discussed with pt that I have referred her to meet with Dr. Beverly Gust tomorrow to talk through her emotional needs and she is agreeable.  She also reports that her daughter is speaking with a gentleman who will build the ramp and son to pay for this.  Will continue to follow for support and d/c planning needs.  Cynthia Clanton, LCSW

## 2015-11-12 ENCOUNTER — Encounter (HOSPITAL_COMMUNITY): Payer: Self-pay

## 2015-11-12 ENCOUNTER — Inpatient Hospital Stay (HOSPITAL_COMMUNITY): Payer: Medicare Other

## 2015-11-12 ENCOUNTER — Inpatient Hospital Stay (HOSPITAL_COMMUNITY): Payer: Medicare Other | Admitting: Physical Therapy

## 2015-11-12 ENCOUNTER — Inpatient Hospital Stay (HOSPITAL_COMMUNITY): Payer: Medicare Other | Admitting: *Deleted

## 2015-11-12 DIAGNOSIS — R609 Edema, unspecified: Secondary | ICD-10-CM

## 2015-11-12 DIAGNOSIS — F4323 Adjustment disorder with mixed anxiety and depressed mood: Secondary | ICD-10-CM

## 2015-11-12 NOTE — Progress Notes (Signed)
Physical Therapy Session Note  Patient Details  Name: Cynthia [name] MRN: [id number] Date of Birth: January 20, 1936  Today's Date: 11/12/2015 PT Individual Time: [phone] and 1400-1445 PT Individual Time Calculation (min): 55 min and 45 min   Short Term Goals: Week 3:  PT Short Term Goal 1 (Week 3): Patient will perform slide board transfers bed <> wheelchair with assist of one person consistently. PT Short Term Goal 2 (Week 3): Patient will perform sitting balance with consistent min A.  PT Short Term Goal 3 (Week 3): Patient will initiate wheelchair propulsion. PT Short Term Goal 4 (Week 3): Patient will tolerate standing with appropriate equipment x 10 min.  PT Short Term Goal 5 (Week 3): Patient will recall pressure relief techniques and call for assistance with mod cues 75% of time.  Skilled Therapeutic Interventions/Progress Updates:   Treatment 1: Patient supine upon arrival, reporting she had not received breakfast tray yet. Donned B aircasts and shoes total A. Patient rolled to R with min A for LLE and verbal cues for sequencing and transferred sidelying > sit with good initiation of bringing BLE off edge of bed, max A to elevate trunk with cues for hand placement and technique. Performed slide board transfer to wheelchair with max A and +2A to position board, verbal/tactile cues for sequencing and hand placement. Patient setup with built up handles and cup with straw to self-feed grits. Patient performed wheelchair propulsion from room > day room > therapy gym using BUE with supervision and no assist needed for LUE this date, demonstrating improved coordination and control of movement in BUE. Performed BLE PROM heel cords and hamstrings seated in chair with increased c/o pain and patient declining standing frame. Reviewed frequency and duration for pressure relief when up in chair. Instructed in pressure relief in wheelchair via forward leans onto tray table with pillow and lateral leans  on pillow on tray table/mat table. Patient c/o increased pressure/pain in back and hips with pressure relief and unable to fully complete adequate pressure relief in chair at this time. Patient also c/o pressure from diaper, discussed with RN who stated patient could wear regular underwear as she is currently being cathed and is not incontinent of urine. Patient left sitting in wheelchair, able to demonstrate use of regular call bell but soft call bell left in lap as well, daughter and NT present.   Treatment 2: Patient in bed, reporting need to check for BM. Performed rolling R and L using rail with min A and cues for sequencing and technique, patient with small BM and total A for hygiene and donning underwear, pants, aircasts and shoes. Patient transferred supine > sit with rail and max A, verbal cues for sequencing and technique. Engaged in static sitting with BUE support and feet supported with supervision progressed to dynamic sitting balance with alternating UE support while engaging in functional tasks of brushing hair, drinking from cup, and multidirectional reaching using R/L UE to target within and slightly outside BOS to facilitate weight shifts. Patient required supervision-mod A for sitting balance and supported rest breaks due to fatigue and shortness of breath. To improve bed mobility, performed sit <> propped on forearm transitions with cues for hand placement and technique with min guard/A RUE x 3 and min guard > supervision LUE x 3. Patient returned to bed with max A to bring BLE on bed, repositioned with total A, and left supine with needs in reach and daughter present.   Therapy Documentation Precautions:  Precautions Precautions:  Fall Precaution Comments: quadriparesis, move BLE very carefully due to pain Restrictions Weight Bearing Restrictions: No Pain:  No c/o pain, numbness and tingling in BUE/BLE  See Function Navigator for Current Functional Status.   Therapy/Group:  Individual Therapy  Laretta Alstrom 11/12/2015, 4:07 PM

## 2015-11-12 NOTE — Progress Notes (Signed)
Occupational Therapy Session Note  Patient Details  Name: JAYANI BEGNOCHE MRN: [id number] Date of Birth: [birth year]  Today's Date: 11/12/2015 OT Individual Time: 0700-0800 OT Individual Time Calculation (min): 60 min    Short Term Goals: Week 2:  OT Short Term Goal 1 (Week 2): Pt will perform toilet/drop arm BSC transfer with assistance of 1 person (max A) OT Short Term Goal 2 (Week 2): Pt will sit EOB with mod A for 10 mins in prepearation for performing UB dressing tasks OT Short Term Goal 3 (Week 2): Pt will don pull over shirt with min A in supported sitting OT Short Term Goal 4 (Week 2): Pt will brush teeth with supervision/set up.  Skilled Therapeutic Interventions/Progress Updates:    Pt resting in bed upon arrival and agreeable to participating in therapy.  Pt engaged in UB bathing and all dressing tasks at bed level this morning.  Pt stated that nursing had "cleaned her up" below the waist earlier.  Pt rolled to the right and left with min A while using bed rails.  Pt required assistance to position BLE appropriately when rolling.  Pt sat with HOB elevated and completed UB bathing and dressing tasks with set up/supervision.  Pt completed all grooming tasks with set up.  Pt continues to verbalize frustration with ongoing neuropathic pain and lack of speedy recovery.  Emotional support provided.  Pt requires more than a reasonable amount of time to complete all tasks with rest breaks to allow the "tingling" to subside.  Focus on activity tolerance, bed mobility, BADL retraining, and BUE use for functional tasks.   Therapy Documentation Precautions:  Precautions Precautions: Fall Precaution Comments: quadriparesis, move BLE very carefully due to pain Restrictions Weight Bearing Restrictions: No Pain: Pain Assessment Pain Assessment: 0-10 Pain Score: 8  Pain Type: Neuropathic pain Pain Location: Leg Pain Orientation: Right;Left Pain Descriptors / Indicators: Burning;Pins and  needles;Sharp;Tingling Pain Onset: With Activity Pain Intervention(s): RN made aware;Repositioned;Emotional support  See Function Navigator for Current Functional Status.   Therapy/Group: Individual Therapy  Leroy Libman 11/12/2015, 8:17 AM

## 2015-11-12 NOTE — Progress Notes (Signed)
*  PRELIMINARY RESULTS* Vascular Ultrasound Left lower extremity venous duplex has been completed.  Preliminary findings: Previous DVT in the left gastroc and peroneal veins appears to have resolved. However, acute DVT is now noted in the left soleal vein, mid calf.    Landry Mellow, RDMS, RVT  11/12/2015, 4:24 PM

## 2015-11-12 NOTE — Progress Notes (Signed)
Subjective/Complaints: Had more pain yesterday---tramadol and capsaicin started.  Didn't like capsaicin. Tramadol seems to have helped. Feeling better today  ROS: Pt denies fever, rash/itching, headache, blurred or double vision, nausea, vomiting, abdominal pain, diarrhea, chest pain, shortness of breath, palpitations, dysuria, dizziness, neck or back pain, bleeding, anxiety, or depression  Objective: Vital Signs: Blood pressure 126/67, pulse 78, temperature 98.6 F (37 C), temperature source Oral, resp. rate 18, height 5\' 2"  (1.575 m), weight 47.6 kg (104 lb 15 oz), SpO2 95 %. No results found. No results found for this or any previous visit (from the past 72 hour(s)).   HEENT: normal Cardio: RRR Resp: CTA B/L GI: BS positive and NT, ND Extremity:  No Edema Skin:   Intact Neuro: Alert/Oriented, Abnormal Sensory absent LT and Proprio in BLE below knees, and Abnormal Motor 3+ to 4 LUE, 3 to 4-/5 RUE----improving grip, 2- B hip ext KE tr-1,  APF trace--no change. Sensation limited in LT/pain in stocking glove distribution in both legs. Has little sense of gross touch in either foot. Hands/UE's with similar pattern but much less affected   Musc/Skel:  Full ROM both ue and lower ext. No edema Gen Fatigued but NAD Psych: appropriate  Assessment/Plan: 1. 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 Physiatrist is providing close team supervision and 24 hour management of active medical problems listed below. Physiatrist and rehab team continue to assess barriers to discharge/monitor patient progress toward functional and medical goals. FIM: Function - Bathing Bathing activity did not occur: Safety/medical concerns Position: Bed Body parts bathed by patient: Right arm, Left arm, Chest, Abdomen (LB bathing deferred to nursing staff) Body parts bathed by helper: Right arm,  Left arm, Chest, Abdomen, Front perineal area, Buttocks, Right upper leg, Left upper leg, Right lower leg, Left lower leg, Back (per nursing total assist) Bathing not applicable: Front perineal area, Buttocks, Right upper leg, Left upper leg, Right lower leg, Left lower leg Assist Level: 2 helpers (per nursing)  Function- Upper Body Dressing/Undressing Upper body dressing/undressing activity did not occur: Refused What is the patient wearing?: Pull over shirt/dress Pull over shirt/dress - Perfomed by patient: Thread/unthread right sleeve, Thread/unthread left sleeve, Put head through opening, Pull shirt over trunk Pull over shirt/dress - Perfomed by helper: Pull shirt over trunk, Thread/unthread right sleeve, Thread/unthread left sleeve Assist Level: Supervision or verbal cues Function - Lower Body Dressing/Undressing What is the patient wearing?: Underwear, Pants, Ted Hose, Non-skid slipper socks Position: Bed Underwear - Performed by helper: Thread/unthread right underwear leg, Thread/unthread left underwear leg, Pull underwear up/down Pants- Performed by helper: Thread/unthread right pants leg, Thread/unthread left pants leg, Pull pants up/down Non-skid slipper socks- Performed by helper: Don/doff right sock, Don/doff left sock Shoes - Performed by helper: Don/doff right shoe, Don/doff left shoe, Fasten right, Fasten left AFO - Performed by helper: Don/doff right AFO, Don/doff left AFO TED Hose - Performed by helper: Don/doff right TED hose, Don/doff left TED hose Assist for footwear: Dependant Assist for lower body dressing: 2 Helpers  Function - Toileting Toileting activity did not occur: No continent bowel/bladder event Toileting steps completed by patient: Adjust clothing prior to toileting, Performs perineal hygiene, Adjust clothing after toileting Toileting steps completed by helper: Adjust clothing prior to toileting, Performs perineal hygiene, Adjust clothing after  toileting  Function - Air cabin crew transfer activity did not occur: Safety/medical concerns  Function - Chair/bed transfer  Chair/bed transfer activity did not occur: Safety/medical concerns (unable to tolerate sitting upright EOB ) Chair/bed transfer method: Lateral scoot Chair/bed transfer assist level: 2 helpers Chair/bed transfer assistive device: Armrests, Sliding board Mechanical lift: Maximove Chair/bed transfer details: Verbal cues for precautions/safety, Visual cues/gestures for precautions/safety, Tactile cues for sequencing, Visual cues/gestures for sequencing, Verbal cues for sequencing  Function - Locomotion: Wheelchair Will patient use wheelchair at discharge?: Yes Type: Manual Wheelchair activity did not occur: Safety/medical concerns (Too fatigued once up in [place] ) Max wheelchair distance: 150 Assist Level: Moderate assistance (Pt 50 - 74%) Assist Level: Moderate assistance (Pt 50 - 74%) Assist Level: Moderate assistance (Pt 50 - 74%) Turns around,maneuvers to table,bed, and toilet,negotiates 3% grade,maneuvers on rugs and over doorsills: No Function - Locomotion: Ambulation Ambulation activity did not occur: Safety/medical concerns  Function - Comprehension Comprehension: Auditory Comprehension assist level: Follows complex conversation/direction with no assist  Function - Expression Expression: Verbal Expression assist level: Expresses complex 90% of the time/cues < 10% of the time  Function - Social Interaction Social Interaction assist level: Interacts appropriately with others with medication or extra time (anti-anxiety, antidepressant).  Function - Problem Solving Problem solving assist level: Solves complex problems: With extra time  Function - Memory Memory assist level: Recognizes or recalls 90% of the time/requires cueing < 10% of the time Patient normally able to recall (first 3 days only): Current season  Medical Problem List and  Plan: 1.  Quadriplegia, numbness, gait instability secondary to GBS  -continue CIR-    2.  DVT Prophylaxis/Anticoagulation: Pharmaceutical: Lovenox  -dopplers with LLE peroneal/gastroc dvt---no change in plan at present  -re-check doppler prior to discharge to determine future course--will recheck tomorrow 3. Pain Management: Tylenol prn. Low dose tramadol seems to have helped  -doesn't tolerate capsaicin---dc     -gabapentin helpful. Increased to 300mg  tid --titrate further to effect  -continue TEDS also as these proved beneficial as well 4. Mood: LCSW to follow for evaluation and support.   5. Neuropsych: This patient is not fully capable of making decisions on her own behalf. 6. Skin/Wound Care: Routine pressure relief measures.   7. Fluids/Electrolytes/Nutrition: Monitor I/O.   -need to watch sodium levels--137      -encourage PO.  -recheck labs friday   8.  Citrobacter Koseri UTI: treated with ctx  -still retaining. Requiring I/O caths  -  urecholine -- 25mg  tid  -f/u ucx pending  -  flomax trial   9. Colon cancer with liver mets: No complaints of pain.    10. UGIB with ABLA: H/H stable and anemia resolving. Continue to monitor for any recurrent signs of bleeding.   Hgb 10.9, trending up--  -encourage fluids and ongoing acclimation.   LOS (Days) 16 A FACE TO FACE EVALUATION WAS PERFORMED  SWARTZ,ZACHARY T 11/12/2015, 9:32 AM

## 2015-11-12 NOTE — Progress Notes (Signed)
Spoke with PA. Dopplar results given. PA states to continue with Lovenox 40 mg Q 24 hr for now. Vance Gather

## 2015-11-12 NOTE — Progress Notes (Signed)
Occupational Therapy Note  Patient Details  Name: Cynthia [name] MRN: [id number] Date of Birth: 10-31-1935  Today's Date: 11/12/2015 OT Individual Time: 1300-1330 OT Individual Time Calculation (min): 30 min   Pt denied pain Individual therapy  Pt sitting in bed with daughter present.  Initial focus on self feeding chicken salad sandwich.  Pt managed sandwich and drink without assistance.  Pt required assistance with eating pie secondary to fatigue from self feeding sandwich.  Continued discharge planning and LTGs met and LTGs to be addressed, specifically BSC transfers and LB dressing seated EOB. Pt and daughter pleased with progress to date.    [name] [facility] 11/12/2015, 2:52 PM

## 2015-11-12 NOTE — Progress Notes (Signed)
Occupational Therapy Weekly Progress Note  Patient Details  Name: Cynthia [name] MRN: [id number] Date of Birth: 04-29-1936  Beginning of progress report period: November 05, 2015 End of progress report period: November 12, 2015   Patient has met 2 of 4 short term goals.  Pt has made slow but steady progress with BADLs and functional use of BUE during this report period.  Pt continues to exhibit increased control and functional use of her BUE. Pt performs grooming tasks (brushing teeth, washing face, brushing hair) after setup.  Pt performs UB dressing with supervision in supported sitting.  Pt is able sit EOB for approx 5 mins with min A. Pt requires tot A for sliding board transfers.   Patient continues to demonstrate the following deficits: severe generalized pain that limits her sitting tolerance and comfort with slide board transfers, BUE weakness, decreased activity tolerance, decreased trunk control and therefore will continue to benefit from skilled OT intervention to enhance overall performance with BADL.  Patient progressing toward long term goals..  Continue plan of care.  OT Short Term Goals Week 2:  OT Short Term Goal 1 (Week 2): Pt will perform toilet/drop arm BSC transfer with assistance of 1 person (max A) OT Short Term Goal 1 - Progress (Week 2): Progressing toward goal OT Short Term Goal 2 (Week 2): Pt will sit EOB with mod A for 10 mins in prepearation for performing UB dressing tasks OT Short Term Goal 2 - Progress (Week 2): Progressing toward goal OT Short Term Goal 3 (Week 2): Pt will don pull over shirt with min A in supported sitting OT Short Term Goal 3 - Progress (Week 2): Met OT Short Term Goal 4 (Week 2): Pt will brush teeth with supervision/set up. OT Short Term Goal 4 - Progress (Week 2): Met Week 3:  OT Short Term Goal 1 (Week 3): Pt will sit EOB with mod A for 10 mins in preparation for performing UB dressing tasks OT Short Term Goal 2 (Week 3): Pt will perform  drop arm BSC SBT transfer with max A OT Short Term Goal 3 (Week 3): Pt will perform LB bathing tasks with mod A at bed level OT Short Term Goal 4 (Week 3): Pt will direct care when requiring assistance for positioning and bed mobility with min verbal cues   Therapy Documentation Precautions:  Precautions Precautions: Fall Precaution Comments: quadriparesis, move BLE very carefully due to pain Restrictions Weight Bearing Restrictions: No  See Function Navigator for Current Functional Status.   [name] [facility] 11/12/2015, 8:35 AM

## 2015-11-13 ENCOUNTER — Inpatient Hospital Stay (HOSPITAL_COMMUNITY): Payer: Self-pay

## 2015-11-13 ENCOUNTER — Inpatient Hospital Stay (HOSPITAL_COMMUNITY): Payer: Self-pay | Admitting: Physical Therapy

## 2015-11-13 ENCOUNTER — Inpatient Hospital Stay (HOSPITAL_COMMUNITY): Payer: Self-pay | Admitting: Occupational Therapy

## 2015-11-13 LAB — CBC
HEMATOCRIT: 34.2 % — AB (ref 36.0–46.0)
Hemoglobin: 10.4 g/dL — ABNORMAL LOW (ref 12.0–15.0)
MCH: 28.3 pg (ref 26.0–34.0)
MCHC: 30.4 g/dL (ref 30.0–36.0)
MCV: 92.9 fL (ref 78.0–100.0)
Platelets: 244 10*3/uL (ref 150–400)
RBC: 3.68 MIL/uL — AB (ref 3.87–5.11)
RDW: 16.1 % — ABNORMAL HIGH (ref 11.5–15.5)
WBC: 5 10*3/uL (ref 4.0–10.5)

## 2015-11-13 LAB — BASIC METABOLIC PANEL
ANION GAP: 7 (ref 5–15)
BUN: 10 mg/dL (ref 6–20)
CHLORIDE: 104 mmol/L (ref 101–111)
CO2: 27 mmol/L (ref 22–32)
Calcium: 8.8 mg/dL — ABNORMAL LOW (ref 8.9–10.3)
Creatinine, Ser: 0.51 mg/dL (ref 0.44–1.00)
GFR calc Af Amer: >60 mL/min (ref >60)
GFR calc non Af Amer: >60 mL/min (ref >60)
GLUCOSE: 96 mg/dL (ref 65–99)
POTASSIUM: 4.1 mmol/L (ref 3.5–5.1)
Sodium: 138 mmol/L (ref 135–145)

## 2015-11-13 LAB — URINE CULTURE: Culture: >=100000

## 2015-11-13 MED ORDER — SULFAMETHOXAZOLE-TRIMETHOPRIM 400-80 MG PO TABS
1.0000 | ORAL_TABLET | Freq: Two times a day (BID) | ORAL | Status: DC
  Administered 2015-11-13 – 2015-11-23 (×21): 1 via ORAL
  Filled 2015-11-13 (×23): qty 1

## 2015-11-13 NOTE — Progress Notes (Signed)
Subjective/Complaints: Had more pain yesterday---tramadol and capsaicin started.  Didn't like capsaicin. Tramadol seems to have helped. Feeling better today  ROS: Pt denies fever, rash/itching, headache, blurred or double vision, nausea, vomiting, abdominal pain, diarrhea, chest pain, shortness of breath, palpitations, dysuria, dizziness, neck or back pain, bleeding, anxiety, or depression  Objective: Vital Signs: Blood pressure 126/73, pulse 72, temperature 97.8 F (36.6 C), temperature source Oral, resp. rate 18, height 5' 2"  (1.575 m), weight 48.6 kg (107 lb 2.3 oz), SpO2 98 %. No results found. Results for orders placed or performed during the hospital encounter of 10/27/15 (from the past 72 hour(s))  Culture, Urine     Status: None (Preliminary result)   Collection Time: 11/11/15  3:25 PM  Result Value Ref Range   Specimen Description URINE, CATHETERIZED    Special Requests NONE    Culture >=100,000 COLONIES/mL GRAM NEGATIVE RODS    Report Status PENDING   CBC     Status: Abnormal   Collection Time: 11/13/15  5:20 AM  Result Value Ref Range   WBC 5.0 4.0 - 10.5 K/uL   RBC 3.68 (L) 3.87 - 5.11 MIL/uL   Hemoglobin 10.4 (L) 12.0 - 15.0 g/dL   HCT 34.2 (L) 36.0 - 46.0 %   MCV 92.9 78.0 - 100.0 fL   MCH 28.3 26.0 - 34.0 pg   MCHC 30.4 30.0 - 36.0 g/dL   RDW 16.1 (H) 11.5 - 15.5 %   Platelets 244 150 - 400 K/uL  Basic metabolic panel     Status: Abnormal   Collection Time: 11/13/15  5:20 AM  Result Value Ref Range   Sodium 138 135 - 145 mmol/L   Potassium 4.1 3.5 - 5.1 mmol/L   Chloride 104 101 - 111 mmol/L   CO2 27 22 - 32 mmol/L   Glucose, Bld 96 65 - 99 mg/dL   BUN 10 6 - 20 mg/dL   Creatinine, Ser 0.51 0.44 - 1.00 mg/dL   Calcium 8.8 (L) 8.9 - 10.3 mg/dL   GFR calc non Af Amer >60 >60 mL/min   GFR calc Af Amer >60 >60 mL/min    Comment: (NOTE) The eGFR has been calculated using the CKD EPI equation. This calculation has not been validated in all clinical  situations. eGFR's persistently <60 mL/min signify possible Chronic Kidney Disease.    Anion gap 7 5 - 15     HEENT: normal Cardio: RRR Resp: CTA B/L GI: BS positive and NT, ND Extremity:  No Edema Skin:   Intact Neuro: Alert/Oriented, Abnormal Sensory absent LT and Proprio in BLE below knees, and Abnormal Motor 3+ to 4 LUE, 3 to 4-/5 RUE----improving grip, 2- B hip ext KE tr-1,  APF trace--no change. Sensation limited in LT/pain in stocking glove distribution in both legs. Has little sense of gross touch in either foot. Hands/UE's with similar pattern but much less affected   Musc/Skel:  Full ROM both ue and lower ext. No edema Gen Fatigued but NAD Psych: appropriate  Assessment/Plan: 1. 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 Physiatrist is providing close team supervision and 24 hour management of active medical problems listed below. Physiatrist and rehab team continue to assess barriers to discharge/monitor patient progress toward functional and medical goals. FIM: Function - Bathing Bathing activity did not occur: Safety/medical concerns Position: Bed Body parts bathed by patient: Right arm, Left arm, Chest, Abdomen (LB  bathing deferred to nursing staff) Body parts bathed by helper: Right arm, Left arm, Chest, Abdomen, Front perineal area, Buttocks, Right upper leg, Left upper leg, Right lower leg, Left lower leg, Back (per nursing total assist) Bathing not applicable: Front perineal area, Buttocks, Right upper leg, Left upper leg, Right lower leg, Left lower leg Assist Level: 2 helpers (per nursing)  Function- Upper Body Dressing/Undressing Upper body dressing/undressing activity did not occur: Refused What is the patient wearing?: Pull over shirt/dress Pull over shirt/dress - Perfomed by patient: Thread/unthread right sleeve, Thread/unthread left sleeve,  Put head through opening, Pull shirt over trunk Pull over shirt/dress - Perfomed by helper: Pull shirt over trunk, Thread/unthread right sleeve, Thread/unthread left sleeve Assist Level: Supervision or verbal cues Function - Lower Body Dressing/Undressing What is the patient wearing?: Underwear, Pants, Ted Hose, Non-skid slipper socks Position: Bed Underwear - Performed by helper: Thread/unthread right underwear leg, Thread/unthread left underwear leg, Pull underwear up/down Pants- Performed by helper: Thread/unthread right pants leg, Thread/unthread left pants leg, Pull pants up/down Non-skid slipper socks- Performed by helper: Don/doff right sock, Don/doff left sock Shoes - Performed by helper: Don/doff right shoe, Don/doff left shoe, Fasten right, Fasten left AFO - Performed by helper: Don/doff right AFO, Don/doff left AFO TED Hose - Performed by helper: Don/doff right TED hose, Don/doff left TED hose Assist for footwear: Dependant Assist for lower body dressing: 2 Helpers  Function - Toileting Toileting activity did not occur: No continent bowel/bladder event Toileting steps completed by patient: Adjust clothing prior to toileting, Performs perineal hygiene, Adjust clothing after toileting Toileting steps completed by helper: Adjust clothing prior to toileting, Performs perineal hygiene, Adjust clothing after toileting Assist level: Touching or steadying assistance (Pt.75%)  Function - Air cabin crew transfer activity did not occur: Safety/medical concerns  Function - Chair/bed transfer Chair/bed transfer activity did not occur: Safety/medical concerns (unable to tolerate sitting upright EOB ) Chair/bed transfer method: Lateral scoot Chair/bed transfer assist level: 2 helpers Chair/bed transfer assistive device: Armrests, Sliding board Mechanical lift: Maximove Chair/bed transfer details: Verbal cues for precautions/safety, Visual cues/gestures for precautions/safety,  Tactile cues for sequencing, Visual cues/gestures for sequencing, Verbal cues for sequencing  Function - Locomotion: Wheelchair Will patient use wheelchair at discharge?: Yes Type: Manual Wheelchair activity did not occur: Safety/medical concerns (Too fatigued once up in [place] ) Max wheelchair distance: 150 Assist Level: Supervision or verbal cues Assist Level: Supervision or verbal cues Assist Level: Supervision or verbal cues Turns around,maneuvers to table,bed, and toilet,negotiates 3% grade,maneuvers on rugs and over doorsills: No Function - Locomotion: Ambulation Ambulation activity did not occur: Safety/medical concerns  Function - Comprehension Comprehension: Auditory Comprehension assist level: Follows complex conversation/direction with no assist  Function - Expression Expression: Verbal Expression assist level: Expresses complex 90% of the time/cues < 10% of the time  Function - Social Interaction Social Interaction assist level: Interacts appropriately with others with medication or extra time (anti-anxiety, antidepressant).  Function - Problem Solving Problem solving assist level: Solves complex problems: With extra time  Function - Memory Memory assist level: Recognizes or recalls 90% of the time/requires cueing < 10% of the time Patient normally able to recall (first 3 days only): Current season  Medical Problem List and Plan: 1.  Quadriplegia, numbness, gait instability secondary to GBS  -continue CIR, slow but gradual progress    2.  DVT Prophylaxis/Anticoagulation: Pharmaceutical: Lovenox  -dopplers with LLE peroneal/gastroc dvt--follow up doppler with clearing but new left soleal thrombus---continue current lovenox dosing---consider re-check prior  to discharge 3. Pain Management: Tylenol prn. Low dose tramadol seems to have helped  -doesn't tolerate capsaicin---dc     -gabapentin helpful. Increased to 346m tid --titrate further to effect  -continue TEDS also  as these proved beneficial as well 4. Mood: LCSW to follow for evaluation and support.   5. Neuropsych: This patient is not fully capable of making decisions on her own behalf. 6. Skin/Wound Care: Routine pressure relief measures.   7. Fluids/Electrolytes/Nutrition: Monitor I/O.   -need to watch sodium levels--138.      -encourage PO.  -I personally reviewed the patient's labs today.    8.  Citrobacter Koseri UTI: treated with ctx  -still retaining. Requiring I/O caths  -  urecholine -- 252mtid  -f/u ucx with 100k GNR--begin empiric bactrim  -  flomax trial   9. Colon cancer with liver mets: No complaints of pain.    10. UGIB with ABLA: H/H stable and anemia resolving. Continue to monitor for any recurrent signs of bleeding.   Hgb 10.4 with general trend upward-  -encourage fluids and ongoing acclimation.   LOS (Days) 17 A FACE TO FACE EVALUATION WAS PERFORMED  SWARTZ,ZACHARY T 11/13/2015, 8:46 AM

## 2015-11-13 NOTE — Progress Notes (Signed)
Occupational Therapy Note  Patient Details  Name: Cynthia [name] MRN: [id number] Date of Birth: 11-27-1935  Today's Date: 11/13/2015 OT Individual Time: 1400-1430 OT Individual Time Calculation (min): 30 min   Pt c/o continued discomfort and numbness/tingling in BUE and BLE; emotional support provided Individual Therapy  Pt engaged in table top activity with super soft theraputty.  Pt challenged with removing and replacing 10 small beads placed in theraputty.  Pt continues to exhibit increased Octavia and gross motor control during functional and therapeutic tasks. Pt returned to room and remained in w/c with all needs within reach and daughter present.    [name] [facility] 11/13/2015, 2:52 PM

## 2015-11-13 NOTE — Progress Notes (Signed)
Physical Therapy Session Note  Patient Details  Name: Cynthia [name] MRN: [id number] Date of Birth: [birth year]  Today's Date: 11/13/2015 PT Individual Time: 1275-1700 PT Individual Time Calculation (min): 40 min   Short Term Goals: Week 3:  PT Short Term Goal 1 (Week 3): Patient will perform slide board transfers bed <> wheelchair with assist of one person consistently. PT Short Term Goal 2 (Week 3): Patient will perform sitting balance with consistent min A.  PT Short Term Goal 3 (Week 3): Patient will initiate wheelchair propulsion. PT Short Term Goal 4 (Week 3): Patient will tolerate standing with appropriate equipment x 10 min.  PT Short Term Goal 5 (Week 3): Patient will recall pressure relief techniques and call for assistance with mod cues 75% of time.  Skilled Therapeutic Interventions/Progress Updates:    Pt received sitting in w/c, reporting soreness in bottom but does not rate, agreeable to therapy session.  PT asked pt to demonstrate pressure relief techniques and pt reports that she is too afraid she will fall out of the w/c when leaning and that her UEs are too weak to perform w/c pushups. PT placed w/c close to bed and removed armrest and pt attempted to lean to L side for pressure relief; LLE slid off of ELR and pt reported it was too painful to dangle and she could not maintain the lateral lean.  Pt agreeable to slide board transfer back to bed for stretching.  Mod assist for slide board transfer with verbal cues for sequencing and manual facilitation for weight shift.  PT provided PROM stretching for heel cords, hamstrings, gluteals, hip flexors, hip add, and hip IR/ER to RLE and LLE.  Pt positioned in L side lying at end of session with call bell in reach and needs met.   Therapy Documentation Precautions:  Precautions Precautions: Fall Precaution Comments: quadriparesis, move BLE very carefully due to pain Restrictions Weight Bearing Restrictions: No General:    Vital Signs: Therapy Vitals Temp: 97.9 F (36.6 C) Temp Source: Oral Pulse Rate: 92 Resp: 16 BP: 105/68 mmHg Patient Position (if appropriate): Sitting Oxygen Therapy SpO2: 99 % O2 Device: Not Delivered Pain:   Mobility:   Locomotion :    Trunk/Postural Assessment :    Balance:   Exercises:   Other Treatments:     See Function Navigator for Current Functional Status.   Therapy/Group: Individual Therapy  Earnest Conroy Penven-Crew 11/13/2015, 4:52 PM

## 2015-11-13 NOTE — Progress Notes (Signed)
Occupational Therapy Session Note  Patient Details  Name: Cynthia [name] MRN: [id number] Date of Birth: 1936-05-04  Today's Date: 11/13/2015 OT Individual Time: [id number] and 1300-1345 OT Individual Time Calculation (min): 43 min and 35 min   Short Term Goals: Week 3:  OT Short Term Goal 1 (Week 3): Pt will sit EOB with mod A for 10 mins in preparation for performing UB dressing tasks OT Short Term Goal 2 (Week 3): Pt will perform drop arm BSC SBT transfer with max A OT Short Term Goal 3 (Week 3): Pt will perform LB bathing tasks with mod A at bed level OT Short Term Goal 4 (Week 3): Pt will direct care when requiring assistance for positioning and bed mobility with min verbal cues  Skilled Therapeutic Interventions/Progress Updates:    Session One: Pt seen for OT session focusing on bed mobility, functional sitting balance, and self feeding. Pt in supine upon arrival, agreeable to tx session. She transferred to EOB with max A and significantly increased time due to pain in R LE. VCs provided throughout for deep breathing technique and sequencing bed mobility.  She sat on EOB with supervision- mod A for static sitting balance to eat lunch. Assist provided to open containers, pt able to self feed sandwich and manage spoon to eat pudding with built up gripper. Pt returned to supine at end of session with mod A to manage B LEs and cues for technique.  Pt left in supine at end of session, all needs in reach and bed alarm on.   Session Two: Pt seen for OT session focusing on functional mobility, transfers, and w/c propulsion. Pt in supine upon arrival, agreeable to tx session. She came sitting EOB with mod A, demonstrating improved mobility since this morning. She completed sliding board transfer to w/c with max A+1 with +2 available for safety to stabilize equipment. Pt self propelled w/c throughout unit for UE strengthening and activity tolerance, requiring increased time and concentration due  to impaired grasping to hold onto w/c rim.  Pt with complaints of bottom hurting following extended time in w/c. Pt on [place] w/c cushion. Educated regarding lateral leans for pressure relief. Pt demonstrated ability to complete lean onto bed, however, was unable to manage w/c arm rest lever to remove arm rest for lean. Co-ban placed around w/c arm rest lever to assist with grasping ability, however, pt still unable to grasp level enough to remove arm rest.  Pt returned to room at end of session total A due to fatigue. She was left sitting up in w/c with all needs in reach in prep for next tx session.   Therapy Documentation Precautions:  Precautions Precautions: Fall Precaution Comments: quadriparesis, move BLE very carefully due to pain Restrictions Weight Bearing Restrictions: No  See Function Navigator for Current Functional Status.   Therapy/Group: Individual Therapy  Lewis, Valentina Alcoser C 11/13/2015, 7:12 AM

## 2015-11-13 NOTE — Progress Notes (Signed)
Occupational Therapy Session Note  Patient Details  Name: Cynthia [name] MRN: [id number] Date of Birth: 06/12/1936  Today's Date: 11/13/2015 OT Individual Time: 0700-0800 OT Individual Time Calculation (min): 60 min    Short Term Goals: Week 3:  OT Short Term Goal 1 (Week 3): Pt will sit EOB with mod A for 10 mins in preparation for performing UB dressing tasks OT Short Term Goal 2 (Week 3): Pt will perform drop arm BSC SBT transfer with max A OT Short Term Goal 3 (Week 3): Pt will perform LB bathing tasks with mod A at bed level OT Short Term Goal 4 (Week 3): Pt will direct care when requiring assistance for positioning and bed mobility with min verbal cues  Skilled Therapeutic Interventions/Progress Updates:    Pt resting in bed upon arrival. Pt completed LB dressing tasks at bed level and dependent level.  Pt rolled on to her right side with min A and initiated placing her BLE off edge of bed requiring min A. Pt required max A to sit EOB in preparation for SBT to w/c.  Pt completed UB dressing tasks and grooming tasks with continued focus on gathering supplies from back of sink and managing supplies while seated at sink.  Pt completes tasks with supervision.  Pt doffed and donned pull over shirt with supervision.  Pt continues to exhibit increased functional control of BUE for BADLs.   Therapy Documentation Precautions:  Precautions Precautions: Fall Precaution Comments: quadriparesis, move BLE very carefully due to pain Restrictions Weight Bearing Restrictions: No   Pain: Pain Assessment Pain Score: 6  Pain Type: Neuropathic pain Pain Location: Leg Pain Orientation: Right;Left Pain Descriptors / Indicators: Tingling;Numbness;Sharp Pain Onset: With Activity  See Function Navigator for Current Functional Status.   Therapy/Group: Individual Therapy  Leroy Libman 11/13/2015, 9:15 AM

## 2015-11-14 ENCOUNTER — Inpatient Hospital Stay (HOSPITAL_COMMUNITY): Payer: Self-pay | Admitting: *Deleted

## 2015-11-14 MED ORDER — MEGESTROL ACETATE 400 MG/10ML PO SUSP
400.0000 mg | Freq: Two times a day (BID) | ORAL | Status: DC
  Administered 2015-11-14: 400 mg via ORAL
  Filled 2015-11-14 (×3): qty 10

## 2015-11-14 NOTE — Progress Notes (Signed)
Subjective/Complaints:   ROS: Pt denies fever, rash/itching, headache, blurred or double vision, nausea, vomiting, abdominal pain, diarrhea, chest pain, shortness of breath, palpitations, dysuria, dizziness,  Objective: Vital Signs: Blood pressure 122/66, pulse 82, temperature 98.8 F (37.1 C), temperature source Oral, resp. rate 20, height 5' 2"  (1.575 m), weight 54.432 kg (120 lb), SpO2 98 %. No results found. Results for orders placed or performed during the hospital encounter of 10/27/15 (from the past 72 hour(s))  Culture, Urine     Status: None   Collection Time: 11/11/15  3:25 PM  Result Value Ref Range   Specimen Description URINE, CATHETERIZED    Special Requests NONE    Culture >=100,000 COLONIES/mL CITROBACTER KOSERI    Report Status 11/13/2015 FINAL    Organism ID, Bacteria CITROBACTER KOSERI       Susceptibility   Citrobacter koseri - MIC*    CEFAZOLIN <=4 SENSITIVE Sensitive     CEFTRIAXONE <=1 SENSITIVE Sensitive     CIPROFLOXACIN <=0.25 SENSITIVE Sensitive     GENTAMICIN 2 SENSITIVE Sensitive     IMIPENEM <=0.25 SENSITIVE Sensitive     NITROFURANTOIN 32 SENSITIVE Sensitive     TRIMETH/SULFA <=20 SENSITIVE Sensitive     PIP/TAZO 8 SENSITIVE Sensitive     * >=100,000 COLONIES/mL CITROBACTER KOSERI  CBC     Status: Abnormal   Collection Time: 11/13/15  5:20 AM  Result Value Ref Range   WBC 5.0 4.0 - 10.5 K/uL   RBC 3.68 (L) 3.87 - 5.11 MIL/uL   Hemoglobin 10.4 (L) 12.0 - 15.0 g/dL   HCT 34.2 (L) 36.0 - 46.0 %   MCV 92.9 78.0 - 100.0 fL   MCH 28.3 26.0 - 34.0 pg   MCHC 30.4 30.0 - 36.0 g/dL   RDW 16.1 (H) 11.5 - 15.5 %   Platelets 244 150 - 400 K/uL  Basic metabolic panel     Status: Abnormal   Collection Time: 11/13/15  5:20 AM  Result Value Ref Range   Sodium 138 135 - 145 mmol/L   Potassium 4.1 3.5 - 5.1 mmol/L   Chloride 104 101 - 111 mmol/L   CO2 27 22 - 32 mmol/L   Glucose, Bld 96 65 - 99 mg/dL   BUN 10 6 - 20 mg/dL   Creatinine, Ser 0.51 0.44  - 1.00 mg/dL   Calcium 8.8 (L) 8.9 - 10.3 mg/dL   GFR calc non Af Amer >60 >60 mL/min   GFR calc Af Amer >60 >60 mL/min    Comment: (NOTE) The eGFR has been calculated using the CKD EPI equation. This calculation has not been validated in all clinical situations. eGFR's persistently <60 mL/min signify possible Chronic Kidney Disease.    Anion gap 7 5 - 15     HEENT: normal Cardio: RRR Resp: CTA B/L GI: BS positive and NT, ND Extremity:  No Edema Skin:   Intact Neuro: Alert/Oriented, Abnormal Sensory absent LT and Proprio in BLE below knees, and Abnormal Motor 3+ to 4 LUE, 3 to 4-/5 RUE----improving grip, 2- B hip ext KE tr-1,  APF trace--no change. Sensation limited in LT/pain in stocking glove distribution in both legs. Has little sense of gross touch in either foot. Hands/UE's with similar pattern but much less affected   Musc/Skel:  Full ROM both ue and lower ext. No edema Gen Fatigued but NAD Psych: appropriate  Assessment/Plan: 1. 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 Physiatrist is providing close team supervision and 24 hour management of active medical problems listed below. Physiatrist and rehab team continue to assess barriers to discharge/monitor patient progress toward functional and medical goals. FIM: Function - Bathing Bathing activity did not occur: N/A Position: Bed Body parts bathed by patient: Right arm, Left arm, Chest, Abdomen (LB bathing deferred to nursing staff) Body parts bathed by helper: Right arm, Left arm, Chest, Abdomen, Front perineal area, Buttocks, Right upper leg, Left upper leg, Right lower leg, Left lower leg, Back (per nursing total assist) Bathing not applicable: Front perineal area, Buttocks, Right upper leg, Left upper leg, Right lower leg, Left lower leg Assist Level: 2 helpers (per nursing)  Function- Upper Body  Dressing/Undressing Upper body dressing/undressing activity did not occur: Refused What is the patient wearing?: Pull over shirt/dress Pull over shirt/dress - Perfomed by patient: Thread/unthread right sleeve, Thread/unthread left sleeve, Put head through opening, Pull shirt over trunk Pull over shirt/dress - Perfomed by helper: Pull shirt over trunk, Thread/unthread right sleeve, Thread/unthread left sleeve Assist Level: Set up Set up : To obtain clothing/put away Function - Lower Body Dressing/Undressing What is the patient wearing?: Underwear, Pants, Ted Hose, Non-skid slipper socks Position: Bed Underwear - Performed by helper: Thread/unthread right underwear leg, Thread/unthread left underwear leg, Pull underwear up/down Pants- Performed by helper: Thread/unthread right pants leg, Thread/unthread left pants leg, Pull pants up/down Non-skid slipper socks- Performed by helper: Don/doff right sock, Don/doff left sock Shoes - Performed by helper: Don/doff right shoe, Don/doff left shoe, Fasten right, Fasten left AFO - Performed by helper: Don/doff right AFO, Don/doff left AFO TED Hose - Performed by helper: Don/doff right TED hose, Don/doff left TED hose Assist for footwear: Dependant Assist for lower body dressing: 2 Helpers  Function - Toileting Toileting activity did not occur: No continent bowel/bladder event Toileting steps completed by patient: Adjust clothing prior to toileting, Performs perineal hygiene, Adjust clothing after toileting Toileting steps completed by helper: Adjust clothing prior to toileting, Performs perineal hygiene, Adjust clothing after toileting Assist level: Touching or steadying assistance (Pt.75%)  Function - Air cabin crew transfer activity did not occur: Safety/medical concerns  Function - Chair/bed transfer Chair/bed transfer activity did not occur: Safety/medical concerns (unable to tolerate sitting upright EOB ) Chair/bed transfer method:  Lateral scoot Chair/bed transfer assist level: Moderate assist (Pt 50 - 74%/lift or lower) Chair/bed transfer assistive device: Armrests, Sliding board Mechanical lift: Maximove Chair/bed transfer details: Verbal cues for precautions/safety, Visual cues/gestures for precautions/safety, Tactile cues for sequencing, Visual cues/gestures for sequencing, Verbal cues for sequencing, Manual facilitation for weight shifting  Function - Locomotion: Wheelchair Will patient use wheelchair at discharge?: Yes Type: Manual Wheelchair activity did not occur: Safety/medical concerns (Too fatigued once up in WC ) Max wheelchair distance: 150 Assist Level: Supervision or verbal cues Assist Level: Supervision or verbal cues Assist Level: Supervision or verbal cues Turns around,maneuvers to table,bed, and toilet,negotiates 3% grade,maneuvers on rugs and over doorsills: No Function - Locomotion: Ambulation Ambulation activity did not occur: Safety/medical concerns  Function - Comprehension Comprehension: Auditory Comprehension assist level: Follows complex conversation/direction with no assist  Function - Expression Expression: Verbal Expression assist level: Expresses complex 90% of the time/cues < 10% of the time  Function - Social Interaction Social Interaction assist level: Interacts appropriately with others with medication or extra time (anti-anxiety, antidepressant).  Function - Problem Solving Problem solving assist level: Solves complex problems: With extra time  Function - Memory Memory  assist level: Recognizes or recalls 90% of the time/requires cueing < 10% of the time Patient normally able to recall (first 3 days only): Current season  Medical Problem List and Plan: 1.  Quadriplegia, numbness, gait instability secondary to GBS  -continue CIR,UE strength improving able to propel WC    2.  DVT Prophylaxis/Anticoagulation: Pharmaceutical: Lovenox  -dopplers with LLE peroneal/gastroc  dvt--follow up doppler with clearing but new left soleal thrombus---continue current lovenox dosing---consider re-check prior to discharge 3. Pain Management: Tylenol prn. Low dose tramadol seems to have helped  -doesn't tolerate capsaicin---dc     -gabapentin helpful. Increased to 357m tid --titrate further to effect  -continue TEDS also as these proved beneficial as well 4. Mood: LCSW to follow for evaluation and support.   5. Neuropsych: This patient is not fully capable of making decisions on her own behalf. 6. Skin/Wound Care: Routine pressure relief measures.   7. Fluids/Electrolytes/Nutrition: Monitor I/O. Poor appetite likely colon Ca related, megace trial  -need to watch sodium levels--138.      -encourage PO.  -I personally reviewed the patient's labs today.    8.  Citrobacter Koseri UTI:   -still retaining. Requiring I/O caths  -  urecholine -- 281mtid  -sens to Bactrim  -  flomax trial   9. Colon cancer with liver mets: No complaints of pain.    10. UGIB with ABLA: H/H stable and anemia resolving. Continue to monitor for any recurrent signs of bleeding.   Hgb 10.4 with general trend upward-  -encourage fluids and ongoing acclimation.   LOS (Days) 18 A FACE TO FACE EVALUATION WAS PERFORMED  Jahzier Villalon E 11/14/2015, 8:33 AM

## 2015-11-15 ENCOUNTER — Inpatient Hospital Stay (HOSPITAL_COMMUNITY): Payer: Medicare Other | Admitting: Physical Therapy

## 2015-11-15 NOTE — Plan of Care (Signed)
Problem: SCI BLADDER ELIMINATION Goal: RH STG MANAGE BLADDER WITH ASSISTANCE STG Manage Bladder With max Assistance  Outcome: Not Progressing Continues to require I/O cath

## 2015-11-15 NOTE — Progress Notes (Signed)
Subjective/Complaints: Tingling pain BUE and BLE afraid to take more meds due to hallucinations ROS: Pt denies fever, rash/itching, headache, blurred or double vision, nausea, vomiting, abdominal pain, diarrhea, chest pain, shortness of breath, palpitations, dysuria, dizziness,  Objective: Vital Signs: Blood pressure 106/60, pulse 76, temperature 97.4 F (36.3 C), temperature source Oral, resp. rate 18, height _0  (1.575 m), weight 52.3 kg (115 lb 4.8 oz), SpO2 97 %. No results found. Results for orders placed or performed during the hospital encounter of 10/27/15 (from the past 72 hour(s))  CBC     Status: Abnormal   Collection Time: 11/13/15  5:20 AM  Result Value Ref Range   WBC 5.0 4.0 - 10.5 K/uL   RBC 3.68 (L) 3.87 - 5.11 MIL/uL   Hemoglobin 10.4 (L) 12.0 - 15.0 g/dL   HCT 34.2 (L) 36.0 - 46.0 %   MCV 92.9 78.0 - 100.0 fL   MCH 28.3 26.0 - 34.0 pg   MCHC 30.4 30.0 - 36.0 g/dL   RDW 16.1 (H) 11.5 - 15.5 %   Platelets 244 150 - 400 K/uL  Basic metabolic panel     Status: Abnormal   Collection Time: 11/13/15  5:20 AM  Result Value Ref Range   Sodium 138 135 - 145 mmol/L   Potassium 4.1 3.5 - 5.1 mmol/L   Chloride 104 101 - 111 mmol/L   CO2 27 22 - 32 mmol/L   Glucose, Bld 96 65 - 99 mg/dL   BUN 10 6 - 20 mg/dL   Creatinine, Ser 0.51 0.44 - 1.00 mg/dL   Calcium 8.8 (L) 8.9 - 10.3 mg/dL   GFR calc non Af Amer >60 >60 mL/min   GFR calc Af Amer >60 >60 mL/min    Comment: (NOTE) The eGFR has been calculated using the CKD EPI equation. This calculation has not been validated in all clinical situations. eGFR's persistently <60 mL/min signify possible Chronic Kidney Disease.    Anion gap 7 5 - 15     HEENT: normal Cardio: RRR Resp: CTA B/L GI: BS positive and NT, ND Extremity:  No Edema Skin:   Intact Neuro: Alert/Oriented, Abnormal Sensory absent LT and Proprio in BLE below knees, and Abnormal Motor 3+ to 4 LUE, 3 to 4-/5 RUE----improving grip, 2- B hip ext KE  tr-1,  APF trace--no change. Sensation limited in LT/pain in stocking glove distribution in both legs. Has little sense of gross touch in either foot. Hands/UE's with similar pattern but much less affected   Musc/Skel:  Full ROM both ue and lower ext. No edema Gen Fatigued but NAD Psych: appropriate  Assessment/Plan: 1. 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 Physiatrist is providing close team supervision and 24 hour management of active medical problems listed below. Physiatrist and rehab team continue to assess barriers to discharge/monitor patient progress toward functional and medical goals. FIM: Function - Bathing Bathing activity did not occur: N/A Position: Bed Body parts bathed by patient: Right arm, Left arm, Chest, Abdomen (LB bathing deferred to nursing staff) Body parts bathed by helper: Right arm, Left arm, Chest, Abdomen, Front perineal area, Buttocks, Right upper leg, Left upper leg, Right lower leg, Left lower leg, Back (per nursing total assist) Bathing not applicable: Front perineal area, Buttocks, Right upper leg, Left upper leg, Right lower leg, Left lower leg Assist Level: 2 helpers (per nursing)  Function- Upper Body Dressing/Undressing Upper  body dressing/undressing activity did not occur: Refused What is the patient wearing?: Pull over shirt/dress Pull over shirt/dress - Perfomed by patient: Thread/unthread right sleeve, Thread/unthread left sleeve, Put head through opening, Pull shirt over trunk Pull over shirt/dress - Perfomed by helper: Pull shirt over trunk, Thread/unthread right sleeve, Thread/unthread left sleeve Assist Level: Set up Set up : To obtain clothing/put away Function - Lower Body Dressing/Undressing What is the patient wearing?: Underwear, Pants, Ted Hose, Non-skid slipper socks Position: Bed Underwear - Performed by helper:  Thread/unthread right underwear leg, Thread/unthread left underwear leg, Pull underwear up/down Pants- Performed by helper: Thread/unthread right pants leg, Thread/unthread left pants leg, Pull pants up/down Non-skid slipper socks- Performed by helper: Don/doff right sock, Don/doff left sock Shoes - Performed by helper: Don/doff right shoe, Don/doff left shoe, Fasten right, Fasten left AFO - Performed by helper: Don/doff right AFO, Don/doff left AFO TED Hose - Performed by helper: Don/doff right TED hose, Don/doff left TED hose Assist for footwear: Dependant Assist for lower body dressing: 2 Helpers  Function - Toileting Toileting activity did not occur: No continent bowel/bladder event Toileting steps completed by patient: Adjust clothing prior to toileting, Performs perineal hygiene, Adjust clothing after toileting Toileting steps completed by helper: Adjust clothing prior to toileting, Performs perineal hygiene, Adjust clothing after toileting Assist level: Touching or steadying assistance (Pt.75%)  Function - Air cabin crew transfer activity did not occur: Safety/medical concerns  Function - Chair/bed transfer Chair/bed transfer activity did not occur: Safety/medical concerns (unable to tolerate sitting upright EOB ) Chair/bed transfer method: Lateral scoot Chair/bed transfer assist level: Moderate assist (Pt 50 - 74%/lift or lower) Chair/bed transfer assistive device: Armrests, Sliding board Mechanical lift: Maximove Chair/bed transfer details: Verbal cues for precautions/safety, Visual cues/gestures for precautions/safety, Tactile cues for sequencing, Visual cues/gestures for sequencing, Verbal cues for sequencing, Manual facilitation for weight shifting  Function - Locomotion: Wheelchair Will patient use wheelchair at discharge?: Yes Type: Manual Wheelchair activity did not occur: Safety/medical concerns (Too fatigued once up in WC ) Max wheelchair distance: 150 Assist  Level: Supervision or verbal cues Assist Level: Supervision or verbal cues Assist Level: Supervision or verbal cues Turns around,maneuvers to table,bed, and toilet,negotiates 3% grade,maneuvers on rugs and over doorsills: No Function - Locomotion: Ambulation Ambulation activity did not occur: Safety/medical concerns  Function - Comprehension Comprehension: Auditory Comprehension assist level: Follows complex conversation/direction with no assist  Function - Expression Expression: Verbal Expression assist level: Expresses complex 90% of the time/cues < 10% of the time  Function - Social Interaction Social Interaction assist level: Interacts appropriately with others with medication or extra time (anti-anxiety, antidepressant).  Function - Problem Solving Problem solving assist level: Solves complex problems: With extra time  Function - Memory Memory assist level: Recognizes or recalls 90% of the time/requires cueing < 10% of the time Patient normally able to recall (first 3 days only): Current season  Medical Problem List and Plan: 1.  Quadriplegia, numbness, gait instability secondary to GBS  -continue CIR,UE strength improving able to propel WC    2.  DVT Prophylaxis/Anticoagulation: Pharmaceutical: Lovenox  -dopplers with LLE peroneal/gastroc dvt--follow up doppler with clearing but new left soleal thrombus---continue current lovenox dosing---consider re-check prior to discharge, asymptomatic, calf is soft 3. Pain Management: Tylenol prn. Low dose tramadol seems to have helped  -doesn't tolerate capsaicin---dc     -gabapentin helpful. Increased to 333m tid --titrate further to effect  -continue TEDS also as these proved beneficial as well 4. Mood: LCSW to  follow for evaluation and support.   5. Neuropsych: This patient is not fully capable of making decisions on her own behalf. 6. Skin/Wound Care: Routine pressure relief measures.   7. Fluids/Electrolytes/Nutrition: Monitor  I/O. Poor appetite likely colon Ca related, megace trial, took 50-100% yesterday but states pt has food preference as main issues on D 3, may get food from home d/c megace  -need to watch sodium levels--138.      -encourage PO.  -I personally reviewed the patient's labs today.    8.  Citrobacter Koseri UTI:   -  -  urecholine -- 38m tid  -sens to Bactrim, afebrile  -  flomax trial   9. Colon cancer with liver mets: No complaints of pain.    10. UGIB with ABLA: H/H stable and anemia resolving. Continue to monitor for any recurrent signs of bleeding.   Hgb 10.4 with general trend upward-  -encourage fluids and ongoing acclimation. 11.  Neurogenic bladder still retaining. Requiring I/O caths   LOS (Days) 19 A FACE TO FACE EVALUATION WAS PERFORMED  Evann Koelzer E 11/15/2015, 8:02 AM

## 2015-11-15 NOTE — Progress Notes (Signed)
Physical Therapy Session Note  Patient Details  Name: Cynthia [name] MRN: [id number] Date of Birth: Apr 23, 1936  Today's Date: 11/15/2015 PT Individual Time: 0800-0900 PT Individual Time Calculation (min): 60 min   Short Term Goals: Week 3:  PT Short Term Goal 1 (Week 3): Patient will perform slide board transfers bed <> wheelchair with assist of one person consistently. PT Short Term Goal 2 (Week 3): Patient will perform sitting balance with consistent min A.  PT Short Term Goal 3 (Week 3): Patient will initiate wheelchair propulsion. PT Short Term Goal 4 (Week 3): Patient will tolerate standing with appropriate equipment x 10 min.  PT Short Term Goal 5 (Week 3): Patient will recall pressure relief techniques and call for assistance with mod cues 75% of time.  Skilled Therapeutic Interventions/Progress Updates:    Pt received R sidelying in bed & agreeable to PT, reporting 8/10 pain/tingling in hands & 6/10 in "tailbone". PT assisted pt with donning ted hose, air casts, and shoes BLE with total A. Pt performed rolling L<>R with use of bed features with Min A for donning pants; PT provided cuing to lift contralateral LE to cross over opposite leg to increase ease of rolling. PT observed small BM & provided total A for hygiene.  Pt with increased ease with rolling and positioning LE as PT instructed. Pt able to transfer L sidelying>sitting at EOB with mod A. Pt with difficulty positioning R elbow/hand underneath her to help support trunk. PT provided total A for placement of sliding board but pt able to sufficiently lean to R without assistance from PT. Pt completed sliding board transfer bed>w/c with min/mod A. PT provided education on head/buttocks relationship & instructed pt on positioning to transfer buttocks across board in easier fashion. Pt required assistance for positioning w/c armrests & total A for managing leg rests. Pt performed grooming tasks at sink from w/c level without  assistance from PT. Pt self propelled w/c ~100 ft from room<>dayroom; pt able to propel w/c over carpet without difficulty & supervision A for overall w/c mobility. PT educated pt to pull back on one wheel while pushing forward on opposite wheel in order to make smaller turn. Pt returned to her room & able to maneuver w/c in small space with verbal cuing from PT on pushing/pulling wheels. Pt recalled need to perform pressure relief every ~30 minutes & PT educated pt on need to call for assistance from NT if task is difficult to complete on her own. At end of session pt left in w/c with breakfast tray set up & all needs met.  During session pt declined pain medicine since she had not eaten breakfast yet.   Therapy Documentation Precautions:  Precautions Precautions: Fall Precaution Comments: quadriparesis, move BLE very carefully due to pain Restrictions Weight Bearing Restrictions: No Pain: Pain Assessment Pain Assessment: 0-10 Pain Score: 8  Pain Location: Hand Pain Orientation: Right;Left Pain Descriptors / Indicators: Tingling Pain Intervention(s): Repositioned;Ambulation/increased activity Multiple Pain Sites: Yes 2nd Pain Site Pain Score: 6 Pain Location:  ("tailbone") Pain Intervention(s): Repositioned;Ambulation/increased activity   See Function Navigator for Current Functional Status.   Therapy/Group: Individual Therapy  Waunita Schooner 11/15/2015, 12:33 PM

## 2015-11-16 ENCOUNTER — Inpatient Hospital Stay (HOSPITAL_COMMUNITY): Payer: Medicare Other | Admitting: Speech Pathology

## 2015-11-16 ENCOUNTER — Inpatient Hospital Stay (HOSPITAL_COMMUNITY): Payer: Self-pay

## 2015-11-16 ENCOUNTER — Inpatient Hospital Stay (HOSPITAL_COMMUNITY): Payer: Medicare Other | Admitting: Occupational Therapy

## 2015-11-16 ENCOUNTER — Inpatient Hospital Stay (HOSPITAL_COMMUNITY): Payer: Medicare Other

## 2015-11-16 ENCOUNTER — Inpatient Hospital Stay (HOSPITAL_COMMUNITY): Payer: Self-pay | Admitting: Physical Therapy

## 2015-11-16 ENCOUNTER — Ambulatory Visit: Payer: Self-pay | Admitting: Oncology

## 2015-11-16 NOTE — Progress Notes (Signed)
Speech Language Pathology Daily Session Note  Patient Details  Name: Cynthia [name] MRN: [id number] Date of Birth: 11-15-1935  Today's Date: 11/16/2015 SLP Individual Time: 0830-0900 SLP Individual Time Calculation (min): 30 min  Short Term Goals: Week 3: SLP Short Term Goal 1 (Week 3): Patient will demonstrate efficient mastication with complete oral clearance with a self-percieved effort level of 5 or less in regards to fatigue with regular textures with supervision verbal cues  over 2 consecutive sesions prior to upgrade.  SLP Short Term Goal 2 (Week 3): Patient will consume current diet with minimal overt s/s of aspiration with Mod I for use of swallowing compenstory strategies.  SLP Short Term Goal 3 (Week 3): Patient will consume trials of thin liquids without the use of a chin tuck without overt s/s of aspiration over 3 consecutive sessions prior to upgrade.   Skilled Therapeutic Interventions: Skilled treatment session focused on dysphagia goals. SLP facilitated session by providing trials of thin liquids via straw without use of the chin tuck without overt s/s of aspiration. Recommend trials without a chin tuck with a meal prior to discharge of strategy. Patient verbalized understanding and left supine in bed with all needs within reach. Continue with current plan of care.    Function:  Eating Eating   Modified Consistency Diet: Yes Eating Assist Level: Set up assist for   Eating Set Up Assist For: Opening containers       Cognition Comprehension Comprehension assist level: Follows complex conversation/direction with no assist  Expression   Expression assist level: Expresses complex 90% of the time/cues < 10% of the time  Social Interaction Social Interaction assist level: Interacts appropriately with others with medication or extra time (anti-anxiety, antidepressant).  Problem Solving Problem solving assist level: Solves complex problems: With extra time  Memory Memory  assist level: Recognizes or recalls 90% of the time/requires cueing < 10% of the time    Pain    Therapy/Group: Individual Therapy  Katye Valek 11/16/2015, 10:40 AM

## 2015-11-16 NOTE — Progress Notes (Signed)
Occupational Therapy Session Note  Patient Details  Name: Cynthia [name] MRN: [id number] Date of Birth: 03-03-1936  Today's Date: 11/16/2015 OT Individual Time:  - 1300-1400  (60 min)      Short Term Goals: Week 1:  OT Short Term Goal 1 (Week 1): Pt will tolerate 7 minutes of functional task with no more than 2 rest breaks in order to increase endurance for functional tasks.  OT Short Term Goal 1 - Progress (Week 1): Met OT Short Term Goal 2 (Week 1): Pt will perform UB bathing  in supported position with min A in order to increase I with self care.  OT Short Term Goal 2 - Progress (Week 1): Discontinued (comment) (Pt receiving nursing night baths at present) OT Short Term Goal 3 (Week 1): Pt will perform toilet transfer with assistance of 1 person in order to decrease level of assist for functional transfers.  OT Short Term Goal 3 - Progress (Week 1): Progressing toward goal OT Short Term Goal 4 (Week 1): Pt willl tolerate sitting wheelchair for functional tasks at sink for 20 minutes or greater in order to increase activity tolerance.  OT Short Term Goal 4 - Progress (Week 1): Met Week 2:  OT Short Term Goal 1 (Week 2): Pt will perform toilet/drop arm BSC transfer with assistance of 1 person (max A) OT Short Term Goal 1 - Progress (Week 2): Progressing toward goal OT Short Term Goal 2 (Week 2): Pt will sit EOB with mod A for 10 mins in prepearation for performing UB dressing tasks OT Short Term Goal 2 - Progress (Week 2): Progressing toward goal OT Short Term Goal 3 (Week 2): Pt will don pull over shirt with min A in supported sitting OT Short Term Goal 3 - Progress (Week 2): Met OT Short Term Goal 4 (Week 2): Pt will brush teeth with supervision/set up. OT Short Term Goal 4 - Progress (Week 2): Met Week 3:  OT Short Term Goal 1 (Week 3): Pt will sit EOB with mod A for 10 mins in preparation for performing UB dressing tasks OT Short Term Goal 2 (Week 3): Pt will perform drop arm BSC  SBT transfer with max A OT Short Term Goal 3 (Week 3): Pt will perform LB bathing tasks with mod A at bed level OT Short Term Goal 4 (Week 3): Pt will direct care when requiring assistance for positioning and bed mobility with min verbal cues  Skilled Therapeutic Interventions/Progress Updates:    HX:  metastatic colon cancer s/p surgery and chemotherapy, recurrent C-diff, HTN, stroke with lleft sided weakness/numbness, DVT who was admitted on 10/06/15 with complaints of dizziness, paraesthesias of right arm and right leg, numbness of hands and feet, BLE weakness and difficulty with walking. [name].  Pt sitting in wc upon OT arrival.  She propelled wc to dayroom.  Transferred to mat with max +2 assist using sliding board.  Scooted to back to mat with max assist.  Engaged in sitting balance activity with BUE in supportive position for 15 minutes with min cues for upright posture and midline control. Transferred to wc and propelled wc to gym and engaged in UE ball toss, lateral arm abduction and neural stretching, pressure relief.  Daughter present during session.  Pt reported increased hand pain as session progressed.  Pt rolled self to room.  Pt . Transferred to bed with max assist to bed.      Therapy Documentation Precautions:  Precautions Precautions: Fall Precaution Comments:  quadriparesis, move BLE very carefully due to pain Restrictions Weight Bearing Restrictions: No    Pain:  6/10 hands      See Function Navigator for Current Functional Status.   Therapy/Group: Individual Therapy  Lisa Roca 11/16/2015, 12:59 PM

## 2015-11-16 NOTE — Progress Notes (Signed)
Physical Therapy Session Note  Patient Details  Name: Cynthia [name] MRN: [id number] Date of Birth: August 04, 1936  Today's Date: 11/16/2015 PT Individual Time: 1010-1110 PT Individual Time Calculation (min): 60 min   Short Term Goals: Week 3:  PT Short Term Goal 1 (Week 3): Patient will perform slide board transfers bed <> wheelchair with assist of one person consistently. PT Short Term Goal 2 (Week 3): Patient will perform sitting balance with consistent min A.  PT Short Term Goal 3 (Week 3): Patient will initiate wheelchair propulsion. PT Short Term Goal 4 (Week 3): Patient will tolerate standing with appropriate equipment x 10 min.  PT Short Term Goal 5 (Week 3): Patient will recall pressure relief techniques and call for assistance with mod cues 75% of time.  Skilled Therapeutic Interventions/Progress Updates:  Tx started late due to RN tx.  Pt performed active assistive exs in bed: 10 x 1 each , in supine: R/L ankle pumps including toe flexion; short arc quad knee ext; in sidelying L/R knee flex/ext, hip flex/ext and L/R hip abduction.    Pt experienced painful parasthesias L calf during movement, unable to rate, which were relieved with rest, diaphragmatic breathing and decreasing tactile input.  Pt performed bed mobility for lateral scooting with slight bridging with max assist. Pt sat EOB x 7 minutes with close supervision/ min guard and cues, while PT donned bil air casts and bil shoes.   Dtr Hilda Blades participated in guarding pt in sitting EOB, and learned how to set up bed (lowered with guard rail down) for pt to perform lateral pressure reliefs.  Pt demonstrated understanding of pressure reliefs, and performed L lateral lean at end of session onto bed, x 1 minute.  Pt left resting in w/c with dtr present.    Therapy Documentation Precautions:  Precautions Precautions: Fall Precaution Comments: quadriparesis, move BLE very carefully due to pain Restrictions Weight Bearing  Restrictions: No            See Function Navigator for Current Functional Status.   Therapy/Group: Individual Therapy  Elanda Garmany 11/16/2015, 11:28 AM

## 2015-11-16 NOTE — Progress Notes (Signed)
Physical Therapy Session Note  Patient Details  Name: Cynthia [name] MRN: [id number] Date of Birth: 1936/04/25  Today's Date: 11/16/2015 PT Individual Time: 1400-1410 PT Individual Time Calculation (min): 10 min   Short Term Goals: Week 3:  PT Short Term Goal 1 (Week 3): Patient will perform slide board transfers bed <> wheelchair with assist of one person consistently. PT Short Term Goal 2 (Week 3): Patient will perform sitting balance with consistent min A.  PT Short Term Goal 3 (Week 3): Patient will initiate wheelchair propulsion. PT Short Term Goal 4 (Week 3): Patient will tolerate standing with appropriate equipment x 10 min.  PT Short Term Goal 5 (Week 3): Patient will recall pressure relief techniques and call for assistance with mod cues 75% of time.  Skilled Therapeutic Interventions/Progress Updates:    Pt received resting in bed reporting extreme fatigue.  Pt reports that she had just propelled her w/c down the hall and back and performed slide board transfer and was too fatigued to do anything.  Therapist offered attempt at sitting tolerance/balance, standing, and transfers but pt declined.  PT educated pt and family on importance of energy conservation throughout therapy and both verbalized understanding.  Pt left supine in bed with call bell in reach and needs met.   Therapy Documentation Precautions:  Precautions Precautions: Fall Precaution Comments: quadriparesis, move BLE very carefully due to pain Restrictions Weight Bearing Restrictions: No General: PT Amount of Missed Time (min): 50 Minutes PT Missed Treatment Reason: Patient fatigue   See Function Navigator for Current Functional Status.   Therapy/Group: Individual Therapy  Earnest Conroy Penven-Crew 11/16/2015, 2:38 PM

## 2015-11-16 NOTE — Progress Notes (Signed)
Subjective/Complaints: Hand pain "there" no worse. Using hands more for personal care, feeding. Able to brush her teeth independently this morning.  ROS: Pt denies fever, rash/itching, headache, blurred or double vision, nausea, vomiting, abdominal pain, diarrhea, chest pain, shortness of breath, palpitations, dysuria, dizziness,  Objective: Vital Signs: Blood pressure 137/52, pulse 65, temperature 98 F (36.7 C), temperature source Oral, resp. rate 18, height 5\' 2"  (1.575 m), weight 53.524 kg (118 lb), SpO2 94 %. No results found. No results found for this or any previous visit (from the past 72 hour(s)).   HEENT: normal Cardio: RRR Resp: CTA B/L GI: BS positive and NT, ND Extremity:  No Edema Skin:   Intact Neuro: Alert/Oriented, Abnormal Sensory absent LT and Proprio in BLE below knees, and Abnormal Motor 3+ to 4 LUE, 3 to 4-/5 RUE----improving grip, 2- B hip ext KE tr-1,  APF trace--no change. Sensation limited in LT/pain in stocking glove distribution in both legs. Has little sense of gross touch in either foot. Hands/UE's with similar pattern but much less affected   Musc/Skel:  Full ROM both ue and lower ext. No edema Gen Fatigued but NAD Psych: appropriate  Assessment/Plan: 1. 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 Physiatrist is providing close team supervision and 24 hour management of active medical problems listed below. Physiatrist and rehab team continue to assess barriers to discharge/monitor patient progress toward functional and medical goals. FIM: Function - Bathing Bathing activity did not occur: N/A Position: Bed Body parts bathed by patient: Right arm, Left arm, Chest, Abdomen (LB bathing deferred to nursing staff) Body parts bathed by helper: Right arm, Left arm, Chest, Abdomen, Front perineal area, Buttocks, Right upper leg, Left upper  leg, Right lower leg, Left lower leg, Back (per nursing total assist) Bathing not applicable: Front perineal area, Buttocks, Right upper leg, Left upper leg, Right lower leg, Left lower leg Assist Level: 2 helpers (per nursing)  Function- Upper Body Dressing/Undressing Upper body dressing/undressing activity did not occur: Refused What is the patient wearing?: Pull over shirt/dress Pull over shirt/dress - Perfomed by patient: Thread/unthread right sleeve, Thread/unthread left sleeve, Put head through opening, Pull shirt over trunk Pull over shirt/dress - Perfomed by helper: Pull shirt over trunk, Thread/unthread right sleeve, Thread/unthread left sleeve Assist Level: Set up Set up : To obtain clothing/put away Function - Lower Body Dressing/Undressing What is the patient wearing?: Underwear, Pants, Ted Hose, Non-skid slipper socks Position: Bed Underwear - Performed by helper: Thread/unthread right underwear leg, Thread/unthread left underwear leg, Pull underwear up/down Pants- Performed by helper: Thread/unthread right pants leg, Thread/unthread left pants leg, Pull pants up/down Non-skid slipper socks- Performed by helper: Don/doff right sock, Don/doff left sock Shoes - Performed by helper: Don/doff right shoe, Don/doff left shoe, Fasten right, Fasten left AFO - Performed by helper: Don/doff right AFO, Don/doff left AFO TED Hose - Performed by helper: Don/doff right TED hose, Don/doff left TED hose Assist for footwear: Dependant Assist for lower body dressing: 2 Helpers  Function - Toileting Toileting activity did not occur: No continent bowel/bladder event Toileting steps completed by patient: Adjust clothing prior to toileting, Performs perineal hygiene, Adjust clothing after toileting Toileting steps completed by helper: Adjust clothing prior to toileting, Performs perineal hygiene, Adjust clothing after toileting Assist level: Touching or steadying assistance (Pt.75%)  Function -  Air cabin crew transfer activity did not occur: Safety/medical concerns  Function -  Chair/bed transfer Chair/bed transfer activity did not occur: Safety/medical concerns (unable to tolerate sitting upright EOB ) Chair/bed transfer method: Lateral scoot Chair/bed transfer assist level: Touching or steadying assistance (Pt > 75%) Chair/bed transfer assistive device: Sliding board Mechanical lift: Maximove Chair/bed transfer details: Tactile cues for weight shifting, Verbal cues for sequencing, Verbal cues for technique  Function - Locomotion: Wheelchair Will patient use wheelchair at discharge?: Yes Type: Manual Wheelchair activity did not occur: Safety/medical concerns (Too fatigued once up in WC ) Max wheelchair distance: 100 Assist Level: Supervision or verbal cues Assist Level: Supervision or verbal cues Assist Level: Supervision or verbal cues Turns around,maneuvers to table,bed, and toilet,negotiates 3% grade,maneuvers on rugs and over doorsills: No Function - Locomotion: Ambulation Ambulation activity did not occur: Safety/medical concerns  Function - Comprehension Comprehension: Auditory Comprehension assist level: Follows complex conversation/direction with no assist  Function - Expression Expression: Verbal Expression assist level: Expresses complex 90% of the time/cues < 10% of the time  Function - Social Interaction Social Interaction assist level: Interacts appropriately with others with medication or extra time (anti-anxiety, antidepressant).  Function - Problem Solving Problem solving assist level: Solves complex problems: With extra time  Function - Memory Memory assist level: Recognizes or recalls 90% of the time/requires cueing < 10% of the time Patient normally able to recall (first 3 days only): Current season  Medical Problem List and Plan: 1.  Quadriplegia, numbness, gait instability secondary to GBS  -continue CIR,UE strength improving able to  propel WC (hands stronger)    2.  DVT Prophylaxis/Anticoagulation: Pharmaceutical: Lovenox  -dopplers with LLE peroneal/gastroc dvt--follow up doppler with clearing but new left soleal thrombus---continue current lovenox dosing---consider re-check prior to discharge, asymptomatic, calf is soft 3. Pain Management: Tylenol prn. Low dose tramadol seems to have helped  -doesn't tolerate capsaicin---dc     -gabapentin helpful. Increased to 300mg  tid --titrate further to effect  -continue TEDS also as these proved beneficial as well 4. Mood: LCSW to follow for evaluation and support.   5. Neuropsych: This patient is not fully capable of making decisions on her own behalf. 6. Skin/Wound Care: Routine pressure relief measures.   7. Fluids/Electrolytes/Nutrition: Monitor I/O. Poor appetite likely colon Ca/diet  -some improvement yesterday  -need to watch sodium levels--138.      -encourage PO.  -I personally reviewed the patient's labs today.    8.  Citrobacter Koseri UTI:   -  urecholine -- 25mg  tid  -sens to Bactrim, afebrile  -  flomax trial   9. Colon cancer with liver mets: No complaints of pain.    10. UGIB with ABLA: H/H stable and anemia resolving. Continue to monitor for any recurrent signs of bleeding.   Hgb 10.4 with general trend upward-  -encourage fluids and ongoing acclimation. 11.  Neurogenic bladder still retaining and requiring I/O caths   LOS (Days) 20 A FACE TO FACE EVALUATION WAS PERFORMED  Mailin Coglianese T 11/16/2015, 8:58 AM

## 2015-11-16 NOTE — Progress Notes (Signed)
Nutrition Follow-up  DOCUMENTATION CODES:   Severe malnutrition in context of acute illness/injury  INTERVENTION:  Continue Boost Plus po TID, each supplement provides 360 kcal and 14 grams of protein.   Encourage adequate PO intake.  NUTRITION DIAGNOSIS:   Malnutrition related to acute illness as evidenced by percent weight loss, moderate depletions of muscle mass; ongoing  GOAL:   Patient will meet greater than or equal to 90% of their needs; met  MONITOR:   PO intake, Supplement acceptance, Weight trends, Labs, I & O's, Skin  REASON FOR ASSESSMENT:   Low Braden    ASSESSMENT:   80 y.o. female with metastatic colon cancer s/p surgery and chemotherapy, recurrent C-diff, HTN, stroke with lleft sided weakness/numbness, DVT who was admitted on 10/06/15 with complaints of dizziness, paraesthesias of right arm and right leg, numbness of hands and feet, BLE weakness and difficulty with walking. She was intubated 02/26- 03/05 due to respiratory insufficiency and was treated for HCAP with Vanc/Zosyn. MRI Cervical/thoracic/lumbar spine showed severe neural foraminal stenosis R-C4, R-C5, B-C6 and L-C7 nerve roots and peripheral enhancement of distal thoracic spinal cord is well nerve roots with anterior and posterior suggestive of GBS  Meal completion has been varied from 25-100%, however mostly meals have been 50-100%. Today's po intake has been 90-95%. Pt has Boost Plus ordered with varied consumption. RD to continue with orders. Pt encouraged to eat her food at meals and to drink her supplements.   Diet Order:  DIET DYS 3 Room service appropriate?: Yes; Fluid consistency:: Thin  Skin:  Wound (see comment) (Stage I pressure ulcer on coccyx)  Last BM:  4/2  Height:   Ht Readings from Last 1 Encounters:  10/27/15 5' 2"  (1.575 m)    Weight:   Wt Readings from Last 1 Encounters:  11/16/15 118 lb (53.524 kg)    Ideal Body Weight:  50 kg  BMI:  Body mass index is 21.58  kg/(m^2).  Estimated Nutritional Needs:   Kcal:  1600-1850  Protein:  80-90 grams  Fluid:  1.6 - 1.8 L/day  EDUCATION NEEDS:   No education needs identified at this time  Corrin Parker, MS, RD, LDN Pager # [phone] After hours/ weekend pager # [phone]

## 2015-11-17 ENCOUNTER — Inpatient Hospital Stay (HOSPITAL_COMMUNITY): Payer: Medicare Other | Admitting: *Deleted

## 2015-11-17 ENCOUNTER — Inpatient Hospital Stay (HOSPITAL_COMMUNITY): Payer: Medicare Other

## 2015-11-17 ENCOUNTER — Inpatient Hospital Stay (HOSPITAL_COMMUNITY): Payer: Medicare Other | Admitting: Physical Therapy

## 2015-11-17 MED ORDER — GABAPENTIN 400 MG PO CAPS
400.0000 mg | ORAL_CAPSULE | Freq: Three times a day (TID) | ORAL | Status: DC
Start: 2015-11-17 — End: 2015-12-01
  Administered 2015-11-17 – 2015-12-01 (×41): 400 mg via ORAL
  Filled 2015-11-17 (×43): qty 1

## 2015-11-17 NOTE — Progress Notes (Signed)
Occupational Therapy Note  Patient Details  Name: Cynthia [name] MRN: [id number] Date of Birth: Sep 30, 1935  Today's Date: 11/17/2015 OT Individual Time: 1045-1200 OT Individual Time Calculation (min): 75 min   Pt denied pain Individual therapy  Pt resting in bed upon arrival with daughter present.  Pt engaged in bed mobility, lateral leans sitting EOB for doffing/donning pants, drop arm BSC transfers (sliding board), and continued discharge planning and family educaiton.  Pt and daughter agreed with recommendation for hospital bed at home.  Pt will not be able to access bathroom in w/c secondary to small size of bathroom.  Discussed bathing/dressing recommendations at home.  Pt practiced SBT bed<>drop arm BSC X 1 with max A.     [name] [facility] 11/17/2015, 12:09 PM

## 2015-11-17 NOTE — Plan of Care (Signed)
Problem: RH SKIN INTEGRITY Goal: RH STG SKIN FREE OF INFECTION/BREAKDOWN Free of further skin breakdown max assist  Outcome: Progressing No breakdown present, but patient is refusing to be repositioned      Problem: RH PAIN MANAGEMENT Goal: RH STG PAIN MANAGED AT OR BELOW PT'S PAIN GOAL 3 or less  Outcome: Not Progressing Says her pain is always an 8/10.

## 2015-11-17 NOTE — Progress Notes (Signed)
Pt was tearful at 0200 cath and was complaining of tingling pain in her hands. Says her left calf hurts to move due to DVT- no other symptoms present. RN offered pain medications and she refused to take anything saying she might hallucinate. RN educated and asked if there was anything she could do to help make pt comfortable. Pt says theres nothing we can do, and she does not need any pain medication. Pt was repositioned and comfortable when RN and NT left the room. Will continue to monitor. Kennieth Francois, RN

## 2015-11-17 NOTE — Progress Notes (Signed)
Physical Therapy Session Note  Patient Details  Name: Cynthia [name] MRN: [id number] Date of Birth: [birth year]  Today's Date: 11/17/2015 PT Individual Time: 4696-2952 PT Individual Time Calculation (min): 31 min (make up time)  Short Term Goals: Week 2:  PT Short Term Goal 1 (Week 2): Patient will perform rolling with hospital bed functions with min A. PT Short Term Goal 1 - Progress (Week 2): Met PT Short Term Goal 2 (Week 2): Patient will perform slide board transfers bed <> wheelchair with +2A.  PT Short Term Goal 2 - Progress (Week 2): Met PT Short Term Goal 3 (Week 2): Patient will maintain sitting balance with mod A x 1 minute. PT Short Term Goal 3 - Progress (Week 2): Met PT Short Term Goal 4 (Week 2): Patient will recall pressure relief techniques and call for assistance with mod cues 75% of time. PT Short Term Goal 4 - Progress (Week 2): Not met  Skilled Therapeutic Interventions/Progress Updates:    Pt received in bed & agreeable to PT, with daughter present. Pt reported she would like to work on BLE stretching & daughter agreeable. Pt and daughter completed BLE stretching program with PT supervision, providing cues for ROM within tolerable, pain-free range for patient. Completed stretches to BLE hamstrings, heel cords, adductors & abductors, and pectoral muscles. Daughter reports increased confidence with stretches, but nervousness regarding causing pt pain. PT reinforced need for pt to provide daughter with feedback throughout entire stretching session to allow stretches to be completed in comfortable range & pt agreeable. At end of session pt repositioned in R sidelying with pillows for support. Pt left with all needs within reach & daughter present.   Therapy Documentation Precautions:  Precautions Precautions: Fall Precaution Comments: quadriparesis, move BLE very carefully due to pain Restrictions Weight Bearing Restrictions: No   See Function Navigator for Current  Functional Status.   Therapy/Group: Individual Therapy  Waunita Schooner 11/17/2015, 4:50 PM

## 2015-11-17 NOTE — Progress Notes (Signed)
Physical Therapy Session Note  Patient Details  Name: Cynthia [name] MRN: [id number] Date of Birth: 06-Jan-1936  Today's Date: 11/17/2015 PT Individual Time: 0900-1000 PT Individual Time Calculation (min): 60 min   Short Term Goals: Week 3:  PT Short Term Goal 1 (Week 3): Patient will perform slide board transfers bed <> wheelchair with assist of one person consistently. PT Short Term Goal 2 (Week 3): Patient will perform sitting balance with consistent min A.  PT Short Term Goal 3 (Week 3): Patient will initiate wheelchair propulsion. PT Short Term Goal 4 (Week 3): Patient will tolerate standing with appropriate equipment x 10 min.  PT Short Term Goal 5 (Week 3): Patient will recall pressure relief techniques and call for assistance with mod cues 75% of time.  Skilled Therapeutic Interventions/Progress Updates:   Patient sitting in wheelchair upon arrival. RT present for co-treat with focus on discharge planning, wheelchair propulsion in controlled environment x 150 ft with supervision, wheelchair setup in preparation for transfer with cues for removing leg rests to get feet on ground, and patient able to lift R arm rest with cues for technique for first time, directing care for slide board transfer with min-mod A faded to max A due to increased tingling pain in hands and cues for head hips relationship and hand placement, sitting balance edge of mat with and without UE support with supervision, and trial of Stedy for BLE weightbearing and neuro re-ed with +2A for sit <> stand via Stedy and patient able to tolerate perched sitting in Stedy x 11 min with supervision. Patient left sitting in wheelchair with needs within reach.   Therapy Documentation Precautions:  Precautions Precautions: Fall Precaution Comments: quadriparesis, move BLE very carefully due to pain Restrictions Weight Bearing Restrictions: No Pain: Unrated numbness and tingling in BUE and BLE  See Function Navigator for  Current Functional Status.   Therapy/Group: Individual Therapy  Laretta Alstrom 11/17/2015, 10:48 AM

## 2015-11-17 NOTE — Progress Notes (Signed)
Recreational Therapy Session Note  Patient Details  Name: FELISHIA WEGRZYN MRN: [id number] Date of Birth: [birth year] Today's Date: 11/17/2015  Pain: c/o tingling in hands and LE's, donned hospital gloves Skilled Therapeutic Interventions/Progress Updates: Session focused on activity tolerance, w/c mobility, slide board transfer, sitting balance, directing her care, WBing through BLE's while seated/"perched" on Stedy.  Pt propelled w/c from room to gym with supervision given extra time.  Pt required questioning cues for w/c & board placment.and max assist to performed slide board transfer.  Pt sat EOM for discharge planning with BUE-no UE support.  Also discussed pt's interest in baking and created an ingredient list to make a chocolate pound cake.  Transitioned to sitting "perched" on Stedy for WBing through BLE's while discussing previous travels.  When distracted with conversation, pt smiling, laughing and without complaints/anxiety in regards to "tingling" or pain.   Therapy/Group: Co-Treatment  Keaundra Stehle 11/17/2015, 10:49 AM

## 2015-11-17 NOTE — Progress Notes (Signed)
Occupational Therapy Session Note  Patient Details  Name: MAYAROSE BERRETTA MRN: [id number] Date of Birth: [birth year]  Today's Date: 11/17/2015 OT Individual Time: [id number] Individual Time Calculation (min): 72 min    Short Term Goals: Week 3:  OT Short Term Goal 1 (Week 3): Pt will sit EOB with mod A for 10 mins in preparation for performing UB dressing tasks OT Short Term Goal 2 (Week 3): Pt will perform drop arm BSC SBT transfer with max A OT Short Term Goal 3 (Week 3): Pt will perform LB bathing tasks with mod A at bed level OT Short Term Goal 4 (Week 3): Pt will direct care when requiring assistance for positioning and bed mobility with min verbal cues  Skilled Therapeutic Interventions/Progress Updates:    Pt engaged in bed mobility, sitting balance, functional transfers, and BUE use for grooming tasks and self feeding. Pt requires min A for rolling side to side to facilitate donning of pants.  Pt performed supine>sit EOB with min A using bed rails.  Pt maintained sitting balance EOB with supervision.  Pt performed sliding board transfer bed>w/c with max A and completed grooming tasks seated in w/c at sink, including washing her hair with shampoo cap.  Pt continues to exhibit improved sitting balance and BUE functional use.   Therapy Documentation Precautions:  Precautions Precautions: Fall Precaution Comments: quadriparesis, move BLE very carefully due to pain Restrictions Weight Bearing Restrictions: No   Pain: Pain Assessment Pain Assessment: Faces Faces Pain Scale: Hurts even more Pain Location: Generalized Pain Descriptors / Indicators: Discomfort;Grimacing Patients Stated Pain Goal: 4 Pain Intervention(s): repositioned, emotional support  See Function Navigator for Current Functional Status.   Therapy/Group: Individual Therapy  Leroy Libman 11/17/2015, 8:14 AM

## 2015-11-17 NOTE — Progress Notes (Signed)
Subjective/Complaints: Hands still tingling. No urge to empty bladder. No new complaints  ROS: Pt denies fever, rash/itching, headache, blurred or double vision, nausea, vomiting, abdominal pain, diarrhea, chest pain, shortness of breath, palpitations, dysuria, dizziness,  Objective: Vital Signs: Blood pressure 112/62, pulse 69, temperature 97.5 F (36.4 C), temperature source Oral, resp. rate 16, height 5\' 2"  (1.575 m), weight 54.432 kg (120 lb), SpO2 99 %. No results found. No results found for this or any previous visit (from the past 72 hour(s)).   HEENT: normal Cardio: RRR Resp: CTA B/L GI: BS positive and NT, ND Extremity:  No Edema Skin:   Intact Neuro: Alert/Oriented, Abnormal Sensory absent LT and Proprio in BLE below knees, and Abnormal Motor 3+ to 4 LUE, 3 to 4-/5 RUE----improving grip, 2- B hip ext KE tr-1,  APF trace--no change. Sensation limited in LT/pain sensation in stocking glove distribution in both legs. Has little sense of gross touch in either foot. Hands/UE's with similar pattern but much less affected   Musc/Skel:  Full ROM both ue and lower ext. No edema Gen Fatigued but NAD Psych: appropriate  Assessment/Plan: 1. 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 Physiatrist is providing close team supervision and 24 hour management of active medical problems listed below. Physiatrist and rehab team continue to assess barriers to discharge/monitor patient progress toward functional and medical goals. FIM: Function - Bathing Bathing activity did not occur: N/A Position: Bed Body parts bathed by patient: Right arm, Left arm, Chest, Abdomen (LB bathing deferred to nursing staff) Body parts bathed by helper: Right arm, Left arm, Chest, Abdomen, Front perineal area, Buttocks, Right upper leg, Left upper leg, Right lower leg, Left lower leg, Back (per  nursing total assist) Bathing not applicable: Front perineal area, Buttocks, Right upper leg, Left upper leg, Right lower leg, Left lower leg Assist Level: 2 helpers (per nursing)  Function- Upper Body Dressing/Undressing Upper body dressing/undressing activity did not occur: Refused What is the patient wearing?: Pull over shirt/dress Pull over shirt/dress - Perfomed by patient: Thread/unthread right sleeve, Thread/unthread left sleeve, Put head through opening, Pull shirt over trunk Pull over shirt/dress - Perfomed by helper: Pull shirt over trunk, Thread/unthread right sleeve, Thread/unthread left sleeve Assist Level: Set up Set up : To obtain clothing/put away Function - Lower Body Dressing/Undressing What is the patient wearing?: Underwear, Pants, Ted Hose, Non-skid slipper socks Position: Bed Underwear - Performed by helper: Thread/unthread right underwear leg, Thread/unthread left underwear leg, Pull underwear up/down Pants- Performed by helper: Thread/unthread right pants leg, Thread/unthread left pants leg, Pull pants up/down Non-skid slipper socks- Performed by helper: Don/doff right sock, Don/doff left sock Shoes - Performed by helper: Don/doff right shoe, Don/doff left shoe, Fasten right, Fasten left AFO - Performed by helper: Don/doff right AFO, Don/doff left AFO TED Hose - Performed by helper: Don/doff right TED hose, Don/doff left TED hose Assist for footwear: Dependant Assist for lower body dressing: 2 Helpers  Function - Toileting Toileting activity did not occur: No continent bowel/bladder event Toileting steps completed by patient: Adjust clothing prior to toileting, Performs perineal hygiene, Adjust clothing after toileting Toileting steps completed by helper: Adjust clothing prior to toileting, Performs perineal hygiene, Adjust clothing after toileting Assist level: Touching or steadying assistance (Pt.75%)  Function - Air cabin crew transfer activity did  not occur: Safety/medical concerns  Function - Chair/bed transfer Chair/bed transfer activity did not occur:  Safety/medical concerns (unable to tolerate sitting upright EOB ) Chair/bed transfer method: Lateral scoot Chair/bed transfer assist level: Maximal assist (Pt 25 - 49%/lift and lower) Chair/bed transfer assistive device: Sliding board Mechanical lift: Maximove Chair/bed transfer details: Tactile cues for weight shifting, Verbal cues for sequencing, Verbal cues for technique, Manual facilitation for placement, Manual facilitation for weight shifting  Function - Locomotion: Wheelchair Will patient use wheelchair at discharge?: Yes Type: Manual Wheelchair activity did not occur: Safety/medical concerns Max wheelchair distance: 100 Assist Level: Supervision or verbal cues Assist Level: Supervision or verbal cues Assist Level: Supervision or verbal cues Turns around,maneuvers to table,bed, and toilet,negotiates 3% grade,maneuvers on rugs and over doorsills: No Function - Locomotion: Ambulation Ambulation activity did not occur: Safety/medical concerns  Function - Comprehension Comprehension: Auditory Comprehension assist level: Follows complex conversation/direction with no assist  Function - Expression Expression: Verbal Expression assist level: Expresses complex 90% of the time/cues < 10% of the time  Function - Social Interaction Social Interaction assist level: Interacts appropriately with others with medication or extra time (anti-anxiety, antidepressant).  Function - Problem Solving Problem solving assist level: Solves basic 50 - 74% of the time/requires cueing 25 - 49% of the time  Function - Memory Memory assist level: Recognizes or recalls 90% of the time/requires cueing < 10% of the time Patient normally able to recall (first 3 days only): Current season  Medical Problem List and Plan: 1.  Quadriplegia, numbness, gait instability secondary to GBS  -continue CIR,     -team conference today 2.  DVT Prophylaxis/Anticoagulation: Pharmaceutical: Lovenox  -dopplers with LLE peroneal/gastroc dvt--follow up doppler with clearing but new left soleal thrombus---continue current lovenox dosing---consider re-check prior to discharge, asymptomatic 3. Pain Management: Tylenol prn. Low dose tramadol seems to have helped        -gabapentin helpful. Increased to 300mg  tid --titrate to 400mg  tid    4. Mood: LCSW to follow for evaluation and support.   5. Neuropsych: This patient is not fully capable of making decisions on her own behalf. 6. Skin/Wound Care: Routine pressure relief measures.   7. Fluids/Electrolytes/Nutrition: Monitor I/O. Poor appetite likely colon Ca/diet  - re-check labs Thursday    -encourage PO.       8.  Citrobacter Koseri UTI:   -  urecholine -- 25mg  tid  -sens to Bactrim, afebrile  -  flomax trial    -timed voids. Up to commode/toilet to empty 9. Colon cancer with liver mets: No complaints of pain.    10. UGIB with ABLA: H/H stable and anemia resolving. Continue to monitor for any recurrent signs of bleeding.   Hgb 10.4 with general trend upward-  -encourage fluids and ongoing acclimation. 11.  Neurogenic bladder still retaining and requiring I/O caths   -she's not having any urge to empty   LOS (Days) 21 A FACE TO FACE EVALUATION WAS PERFORMED  SWARTZ,ZACHARY T 11/17/2015, 8:55 AM

## 2015-11-18 ENCOUNTER — Inpatient Hospital Stay (HOSPITAL_COMMUNITY): Payer: Medicare Other | Admitting: Speech Pathology

## 2015-11-18 ENCOUNTER — Inpatient Hospital Stay (HOSPITAL_COMMUNITY): Payer: Medicare Other | Admitting: *Deleted

## 2015-11-18 ENCOUNTER — Inpatient Hospital Stay (HOSPITAL_COMMUNITY): Payer: Medicare Other | Admitting: Physical Therapy

## 2015-11-18 MED ORDER — BETHANECHOL CHLORIDE 25 MG PO TABS
25.0000 mg | ORAL_TABLET | Freq: Four times a day (QID) | ORAL | Status: DC
  Administered 2015-11-18 – 2015-11-26 (×32): 25 mg via ORAL
  Filled 2015-11-18 (×33): qty 1

## 2015-11-18 NOTE — Progress Notes (Signed)
Speech Language Pathology Daily Session Note  Patient Details  Name: Cynthia [name] MRN: [id number] Date of Birth: Oct 22, 1935  Today's Date: 11/18/2015 SLP Individual Time: 1300-1325 SLP Individual Time Calculation (min): 25 min  Short Term Goals: Week 3: SLP Short Term Goal 1 (Week 3): Patient will demonstrate efficient mastication with complete oral clearance with a self-percieved effort level of 5 or less in regards to fatigue with regular textures with supervision verbal cues  over 2 consecutive sesions prior to upgrade.  SLP Short Term Goal 2 (Week 3): Patient will consume current diet with minimal overt s/s of aspiration with Mod I for use of swallowing compenstory strategies.  SLP Short Term Goal 3 (Week 3): Patient will consume trials of thin liquids without the use of a chin tuck without overt s/s of aspiration over 3 consecutive sessions prior to upgrade.   Skilled Therapeutic Interventions: Skilled treatment session focused on dysphagia goals. Patient was administered the 3 oz water swallow challenge. Patient consumed 3 oz via straw without use of the chin tuck without overt s/s of aspiration. Recommend trial tray with meal of Dys. 3 textures with thin liquids without use of a chin tuck with full supervision prior to upgrade. Patient left supine in bed with family present. Continue with current plan of care.    Function:  Eating Eating   Modified Consistency Diet: Yes Eating Assist Level: Set up assist for         Cognition Comprehension Comprehension assist level: Follows complex conversation/direction with no assist  Expression   Expression assist level: Expresses complex 90% of the time/cues < 10% of the time  Social Interaction Social Interaction assist level: Interacts appropriately with others with medication or extra time (anti-anxiety, antidepressant).  Problem Solving Problem solving assist level: Solves complex problems: With extra time  Memory Memory assist  level: Recognizes or recalls 90% of the time/requires cueing < 10% of the time    Pain Pain Assessment Pain Assessment: No/denies pain  Therapy/Group: Individual Therapy  Garlen Reinig 11/18/2015, 3:32 PM

## 2015-11-18 NOTE — Progress Notes (Signed)
Occupational Therapy Session Note  Patient Details  Name: Cynthia [name] MRN: [id number] Date of Birth: [birth year]  Today's Date: 11/18/2015 OT Individual Time: 0700-0800 OT Individual Time Calculation (min): 60 min    Short Term Goals: Week 3:  OT Short Term Goal 1 (Week 3): Pt will sit EOB with mod A for 10 mins in preparation for performing UB dressing tasks OT Short Term Goal 2 (Week 3): Pt will perform drop arm BSC SBT transfer with max A OT Short Term Goal 3 (Week 3): Pt will perform LB bathing tasks with mod A at bed level OT Short Term Goal 4 (Week 3): Pt will direct care when requiring assistance for positioning and bed mobility with min verbal cues  Skilled Therapeutic Interventions/Progress Updates:    Pt resting in bed upon arrival.  Pt engaged in LB dressing tasks at bed level.  Pt initiating raising/lifting BLE to assist with donning pants.  Pt rolls side to side in bed using bed rails with supervision to facilitate pulling pants over hips.  Pt went from supine to sitting EOB with supervision using bed rails.  Pt performed lateral lean to facilitate placement of sliding board for transfer to w/c.  Pt performed SBT to w/c with mod A.  Pt continues to requires mod verbal cues to lean forward during transfer.  Pt leaned posteriorly during transfer precipitating need for mod A during transfer.  Discussed pt the importance of leaning forward to prevent sliding off the front of the sliding board.  Pt verbalized understanding.  Pt completed grooming tasks at sink with setup.  Pt transitioned to self feeding breakfast after setup without assistance.  Focus on activity tolerance, bed mobility, sitting balance, functional transfers, increased functional use of BUE, and safety awareness to increase independence with BADLs.   Therapy Documentation Precautions:  Precautions Precautions: Fall Precaution Comments: quadriparesis, move BLE very carefully due to pain Restrictions Weight  Bearing Restrictions: No  Pain: Pain Assessment Pain Assessment: No/denies pain Pain Intervention(s): Refused  See Function Navigator for Current Functional Status.   Therapy/Group: Individual Therapy  Leroy Libman 11/18/2015, 8:01 AM

## 2015-11-18 NOTE — Progress Notes (Signed)
Occupational Therapy Note  Patient Details  Name: Cynthia [name] MRN: [id number] Date of Birth: [birth year]  Today's Date: 11/18/2015 OT Individual Time: 0900-1000 OT Individual Time Calculation (min): 60 min   Pt denied pain  Pt engaged in cooking activity to increase functional use of BUE.  Co-treatment with Recreational Therapist. Pt performed tasks at w/c level.  Pt was able to remove wrapping from butter and placed in container in preparation for mixing ingredients.  Pt was able to hold electric mixer and used spoon/whisk for mixing dry ingredients.  Pt completed task with setup assist and extra time to complete all tasks.  Pt pleased with her ability to perform cooking tasks.     [name] [facility] 11/18/2015, 11:03 AM

## 2015-11-18 NOTE — Patient Care Conference (Signed)
Inpatient RehabilitationTeam Conference and Plan of Care Update Date: 11/17/2015   Time: 2:30 PM    Patient Name: Cynthia [name]      Medical Record Number: [id number]  Date of Birth: Jan 23, 1936 Sex: Female         Room/Bed: 4W20C/4W20C-01 Payor Info: Payor: [employer] MEDICARE / Plan: [facility] MEDICARE / Product Type: *No Product type* /    Admitting Diagnosis: GBS  Admit Date/Time:  10/27/2015  3:35 PM Admission Comments: No comment available   Primary Diagnosis:  Quadriplegia and quadriparesis ([place]) Principal Problem: Quadriplegia and quadriparesis ([place])  Patient Active Problem List   Diagnosis Date Noted  . Quadriplegia and quadriparesis ([place])   . Numbness and tingling   . Paresthesias   . UTI (lower urinary tract infection)   . History of colon cancer   . Acute blood loss anemia   . Upper GI bleed   . GBS (Guillain-Barre syndrome) ([place])   . Aspiration into airway   . Endotracheally intubated   . Acute respiratory failure ([place])   . Shock ([place]) 10/09/2015  . Hemoptysis   . Transverse myelitis ([place])   . Peripheral neuropathy ([place]) 10/06/2015  . Paresthesia of both hands 10/06/2015  . Paresthesia of both feet 10/06/2015  . Cerebral embolism with cerebral infarction 10/06/2015  . Ataxia   . Colon carcinoma metastatic to liver ([place]) 09/25/2015  . Metastasis to liver ([place]) 08/28/2015  . Colon cancer metastasized to liver ([place])   . Liver lesion   . Cerebral infarction due to unspecified mechanism 01/29/2015  . B12 deficiency 01/29/2015  . Essential hypertension 01/29/2015  . Arm numbness left   . Disorientation   . Numbness and tingling of left side of face   . TIA (transient ischemic attack) 01/11/2015  . Confusion   . Left arm numbness   . DVT, lower extremity, distal ([place]) 10/27/2013  . T4a, N0 09/27/2013  . Iron deficiency anemia 09/04/2013  . Symptomatic anemia 09/03/2013  . Orthostasis 09/03/2013  . Near syncope 09/03/2013    Expected Discharge Date:  Expected Discharge Date: 12/01/15  Team Members Present: Physician leading conference: Dr. Alger Simons Social Worker Present: Lennart Pall, LCSW Nurse Present: Heather Roberts, RN PT Present: Carney Living, PT OT Present: Willeen Cass, OT;Roanna Epley, COTA SLP Present: Weston Anna, SLP PPS Coordinator present : Daiva Nakayama, RN, [facility]     Current Status/Progress Goal Weekly Team Focus  Medical   hands gradually improving. pain an issue but better. lower ext quite week  improved pain control  see prior   Bowel/Bladder   pt is incont of bowel, LBM: 11/16/2015. requires I&O cath q8 hours with volumes in the 200's  cont of B/B with min assist   continue I&O cath and do timed toileting, monitor for bowel program    Swallow/Nutrition/ Hydration   Dys. 3 textures with thin liquids per patient preference for energy conservation with Mod I. May have snacks of regular textures.   Supervision  tolerance of current diet, trials of thin without chin tuck    ADL's   UB bathing-supervision; UB dressing-supervision; functional transfers-max A + 2; sitting balance-min A; improved funcitonal use of BUE  mod A overall except UB dressing at supervision  activity tolerance, bed mobility, sitting balance, funcitonal transfers, BADL retraining, family education   Mobility   min-mod bed mobility, supervision sitting balance, mod-max Aa slide board transfers, supervision wheelchair propulsion  min A sitting balance, max A bed mobility, mod A transfers   functional  mobility, neuro re-ed, activity tolerance, sitting balance, stretching program, pt/family education   Communication             Safety/Cognition/ Behavioral Observations            Pain   complains of tingling in hands and bilateral lower extremities. left calf pain but refuses any pain medications. Doesn't want to hallucinate, and says nothing works.   <4  assess and treat pain qshift and PRN    Skin   MASD to buttocks, cream in use. refusing to  be turned q2 hours at night   skin remain free from infection and breakdown   monitor skin qshift and PRN, reposition q2 hours     Rehab Goals Patient on target to meet rehab goals: Yes *See Care Plan and progress notes for long and short-term goals.  Barriers to Discharge: profound neuro deficits    Possible Resolutions to Barriers:  see prior    Discharge Planning/Teaching Needs:  Plan home with daughter as primary caregiver and able to provide 24/7 assistance along with help from daughter-in-law  Teaching has been ongoing   Team Discussion:  Continue to make progress but very slowly.  Good functional gains this week.  Sitting balance much improved.  Therapies recommend an extension to LOS x 1 week with expectation of being able to upgrade some goals to min assist.  Expect better bsc transfers.  Discussion with nursing about need to make sure getting up to bsc to toilet as continue to work on b/b training.  SW to follow up with family and insurance about extension and to confirm with family their commitment to d/c home vs if need to consider SNF.  Revisions to Treatment Plan:  Hope to upgrade most goals to min assist with extension of LOS   Continued Need for Acute Rehabilitation Level of Care: The patient requires daily medical management by a physician with specialized training in physical medicine and rehabilitation for the following conditions: Daily direction of a multidisciplinary physical rehabilitation program to ensure safe treatment while eliciting the highest outcome that is of practical value to the patient.: Yes Daily medical management of patient stability for increased activity during participation in an intensive rehabilitation regime.: Yes Daily analysis of laboratory values and/or radiology reports with any subsequent need for medication adjustment of medical intervention for : Neurological problems;Other;Urological problems  [name], [place] 11/18/2015, 9:10 AM

## 2015-11-18 NOTE — Progress Notes (Signed)
Pt very tearful at med pass. She expressed that she felt like there was no hope in her getting better. Emotional support given and patient educated. Pt says her son wants her to do to a rehab facility and daughter and her want her to go. She says she is not sure that her daughter will be able to take care of her at home, but all she wants to do is go home. Will continue to monitor. Kennieth Francois, RN

## 2015-11-18 NOTE — Progress Notes (Signed)
Physical Therapy Weekly Progress Note  Patient Details  Name: Cynthia [name] MRN: [id number] Date of Birth: [birth year]  Beginning of progress report period: November 11, 2015 End of progress report period: November 18, 2015  Today's Date: 11/18/2015 PT Individual Time: 1100-1205 PT Individual Time Calculation (min): 65 min   Patient has met 3 of 5 short term goals. Patient continues to make steady functional progress with sitting balance, bed mobility, postural control, upper extremity coordination, slide board transfers, and wheelchair propulsion and sitting tolerance in manual wheelchair. Patient is also better able to direct care with transfer setup with minimal cueing. Patient's ability to stand with use of equipment has been limited by increased neuropathic pain in BLE, however, patient was able to tolerate Stedy yesterday with +2A and demonstrated good ability to bear weight through BLE. Plan to focus on family training with patient's daughter for all functional mobility and progression of standing tolerance in preparation for discharge home.   Patient continues to demonstrate the following deficits: quadriparesis, muscle weakness, neuropathic pain, muscle joint tightness, decreased cardiorespiratory endurance, decreased proprioception, impaired sensation, impaired timing and sequencing, abnormal tone, unbalanced muscle activation, decreased coordination, decreased memory, decreased sitting balance, decreased postural control, and decreased balance strategies and therefore will continue to benefit from skilled PT intervention to enhance overall performance with activity tolerance, balance, postural control, ability to compensate for deficits, functional use of  right upper extremity, right lower extremity, left upper extremity and left lower extremity and coordination.  Patient progressing toward long term goals.  Plan of care revisions: Upgraded sitting balance to supervision, transfers to min A,  and bed mobility to mod A.  PT Short Term Goals Week 3:  PT Short Term Goal 1 (Week 3): Patient will perform slide board transfers bed <> wheelchair with assist of one person consistently. PT Short Term Goal 1 - Progress (Week 3): Met PT Short Term Goal 2 (Week 3): Patient will perform sitting balance with consistent min A.  PT Short Term Goal 2 - Progress (Week 3): Met PT Short Term Goal 3 (Week 3): Patient will initiate wheelchair propulsion. PT Short Term Goal 3 - Progress (Week 3): Met PT Short Term Goal 4 (Week 3): Patient will tolerate standing with appropriate equipment x 10 min.  PT Short Term Goal 4 - Progress (Week 3): Not met PT Short Term Goal 5 (Week 3): Patient will recall pressure relief techniques and call for assistance with mod cues 75% of time. PT Short Term Goal 5 - Progress (Week 3): Progressing toward goal Week 4:  PT Short Term Goal 1 (Week 4): = LTGs    Skilled Therapeutic Interventions/Progress Updates:   Patient in bed upon arrival. Donned aircasts and shoes with total A and patient transferred supine > sit edge of bed using rail with HOB flat with supervision for first time as witnessed by this PT and increased time, min cues for scooting to edge of bed to get feet on floor. Performed level slide board transfer bed > wheelchair with min A and assist for placing/removing board, verbal cues for head hips relationship and forward lean. Patient did not recall frequency for pressure relief but with min cues was able to recall technique for lateral/forward leans but reports she only performs this when her bottom is hurting. Reinforced need to perform pressure relief approx every 30 minutes when sitting, patient verbalized understanding. Discussed home setup and potential barriers. Patient has ramp entry and small bathroom so wheelchair will not be able to  fit through bathroom but patient will be able to access all other areas of house and reports she sits mostly on couch which is  similar to couch in ADL apartment. Patient directed wheelchair setup for transfer with min cues and performed slide board transfer wheelchair > couch downhill with min A and couch > wheelchair uphill with modA and mod verbal cues for head hips and forward lean. Switched out 18 x 18 wheelchair for 16 x 16 wheelchair with patient reporting improved sitting tolerance and comfort and provided with wheelchair gloves to improve grip and coordination while propelling back to room x 200 ft with supervision and increased time. Patient left sitting in wheelchair with daughter in law present and needs in reach.   Therapy Documentation Precautions:  Precautions Precautions: Fall Precaution Comments: quadriparesis, move BLE very carefully due to pain Restrictions Weight Bearing Restrictions: No Pain: Pain Assessment Pain Assessment: No/denies pain   See Function Navigator for Current Functional Status.  Therapy/Group: Individual Therapy  Laretta Alstrom 11/18/2015, 12:42 PM

## 2015-11-18 NOTE — Progress Notes (Signed)
Stool was formed, large, and brown. Cont on BSC.

## 2015-11-18 NOTE — Progress Notes (Signed)
Recreational Therapy Session Note  Patient Details  Name: Cynthia [name] MRN: [id number] Date of Birth: 06-Dec-1935 Today's Date: 11/18/2015  Pain: no c/o Skilled Therapeutic Interventions/Progress Updates: Pt participated in kitchen task making a pound cake seated w/c level during co-treat with PT.  Pt required set up assist and excess time for task completion.  Pt enjoyed baking the cake.  Therapy/Group: Co-Treatment Cai Flott 11/18/2015, 12:55 PM

## 2015-11-18 NOTE — Progress Notes (Signed)
Social Work Patient ID: Pricilla Loveless, female   DOB: 1935/12/26, 80 y.o.   MRN: [id number]   Lengthy discussion yesterday afternoon with pt and daughter and then via phone with pt's son, Zeke.   Review of team conference and recommendation of LOS extension of one week with newly targeted d/c date of 4/18 and likely upgrade in several goals to min assist.  Discussion with pt and daughter about need to really confirm with daughter her level of comfort in providing minimal assistance and 24/7 care.  Daughter appears nervous as she notes that her brother has expressed a lot of concern about her abilities to manage this for an indefinite period of time.  Daughter states, "I don't want to assume I can do it then get her home and end up hurting her.Burnett Harry never done anything like this before and I'm scared..."  We discussed beginning to have her do more "hands on" training which she is agreeable to.  I also discussed with them the option of NOT going directly home but to considering a SNF d/c to allow further recovery with daily txs.  Pt becomes visibly upset with this idea, however, states that she understands that this needs to be discussed.   I have reviewed all of this info with pt's son, Coy Saunas, as well who states understanding of the d/c options and is very supportive of doing "what is best" for his sister and mother.  Have asked that all three of them discuss plan over the next couple of days.  I will follow up with insurance CM to see if SNF coverage is an option should pt/ family decide this is to be the plan.  Will keep tx team posted on d/c plans.  Lucillie Kiesel, LCSW

## 2015-11-18 NOTE — Progress Notes (Signed)
RN and tech got patient on the [facility] with 2 person assist with a slide board. Pt immediately voided, and had a formed BM. RN did do digital stimulation because patient felt that she was not empty. Pt moved back into bed, and PVR for 37ml. No cath needed. Kennieth Francois, RN

## 2015-11-18 NOTE — Progress Notes (Signed)
Subjective/Complaints: Felt that she was able to use legs more yesterday. Had spontaneous void on BSC last night.   ROS: Pt denies fever, rash/itching, headache, blurred or double vision, nausea, vomiting, abdominal pain, diarrhea, chest pain, shortness of breath, palpitations, dysuria, dizziness,  Objective: Vital Signs: Blood pressure 116/58, pulse 71, temperature 97.8 F (36.6 C), temperature source Oral, resp. rate 18, height 5\' 2"  (1.575 m), weight 57.698 kg (127 lb 3.2 oz), SpO2 100 %. No results found. No results found for this or any previous visit (from the past 72 hour(s)).   HEENT: normal Cardio: RRR Resp: CTA B/L GI: BS positive and NT, ND Extremity:  No Edema Skin:   Intact Neuro: Alert/Oriented, Abnormal Sensory absent LT and Proprio in BLE below knees, and Abnormal Motor 3+ to 4 LUE, 3 to 4-/5 RUE----improving grip, 2- B hip ext KE tr-1,  APF trace--no change. Sensation limited in LT/pain sensation in stocking glove distribution in both legs. Has little sense of gross touch in either foot. Hands/UE's with similar pattern but much less affected   Musc/Skel:  Full ROM both ue and lower ext. No edema Gen Fatigued but NAD Psych: appropriate  Assessment/Plan: 1. 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 Physiatrist is providing close team supervision and 24 hour management of active medical problems listed below. Physiatrist and rehab team continue to assess barriers to discharge/monitor patient progress toward functional and medical goals. FIM: Function - Bathing Bathing activity did not occur: N/A Position: Bed Body parts bathed by patient: Right arm, Left arm, Chest, Abdomen (LB bathing deferred to nursing staff) Body parts bathed by helper: Right arm, Left arm, Chest, Abdomen, Front perineal area, Buttocks, Right upper leg, Left upper leg, Right  lower leg, Left lower leg, Back (per nursing total assist) Bathing not applicable: Front perineal area, Buttocks, Right upper leg, Left upper leg, Right lower leg, Left lower leg Assist Level: 2 helpers (per nursing)  Function- Upper Body Dressing/Undressing Upper body dressing/undressing activity did not occur: Refused What is the patient wearing?: Pull over shirt/dress Pull over shirt/dress - Perfomed by patient: Thread/unthread right sleeve, Thread/unthread left sleeve, Put head through opening, Pull shirt over trunk Pull over shirt/dress - Perfomed by helper: Pull shirt over trunk, Thread/unthread right sleeve, Thread/unthread left sleeve Assist Level: Set up Set up : To obtain clothing/put away Function - Lower Body Dressing/Undressing What is the patient wearing?: Underwear, Pants, Ted Hose, Non-skid slipper socks Position: Bed Underwear - Performed by helper: Thread/unthread right underwear leg, Thread/unthread left underwear leg, Pull underwear up/down Pants- Performed by helper: Thread/unthread right pants leg, Thread/unthread left pants leg, Pull pants up/down Non-skid slipper socks- Performed by helper: Don/doff right sock, Don/doff left sock Shoes - Performed by helper: Don/doff right shoe, Don/doff left shoe, Fasten right, Fasten left AFO - Performed by helper: Don/doff right AFO, Don/doff left AFO TED Hose - Performed by helper: Don/doff right TED hose, Don/doff left TED hose Assist for footwear: Dependant Assist for lower body dressing: 2 Helpers  Function - Toileting Toileting activity did not occur: No continent bowel/bladder event Toileting steps completed by patient: Adjust clothing prior to toileting, Performs perineal hygiene, Adjust clothing after toileting Toileting steps completed by helper: Adjust clothing prior to toileting, Performs perineal hygiene, Adjust clothing after toileting Assist level: Touching or steadying assistance (Pt.75%)  Function - Engineer, petroleum transfer activity did not occur: Safety/medical concerns Toilet transfer  assistive device: Drop arm commode, Sliding board Assist level to toilet: Maximal assist (Pt 25 - 49%/lift and lower) Assist level from toilet: Maximal assist (Pt 25 - 49%/lift and lower)  Function - Chair/bed transfer Chair/bed transfer activity did not occur: Safety/medical concerns (unable to tolerate sitting upright EOB ) Chair/bed transfer method: Lateral scoot Chair/bed transfer assist level: Maximal assist (Pt 25 - 49%/lift and lower) Chair/bed transfer assistive device: Sliding board Mechanical lift: Maximove Chair/bed transfer details: Tactile cues for weight shifting, Verbal cues for sequencing, Verbal cues for technique, Manual facilitation for placement, Manual facilitation for weight shifting  Function - Locomotion: Wheelchair Will patient use wheelchair at discharge?: Yes Type: Manual Wheelchair activity did not occur: Safety/medical concerns Max wheelchair distance: 150 Assist Level: Supervision or verbal cues Assist Level: Supervision or verbal cues Assist Level: Supervision or verbal cues Turns around,maneuvers to table,bed, and toilet,negotiates 3% grade,maneuvers on rugs and over doorsills: No Function - Locomotion: Ambulation Ambulation activity did not occur: Safety/medical concerns  Function - Comprehension Comprehension: Auditory Comprehension assist level: Follows complex conversation/direction with no assist  Function - Expression Expression: Verbal Expression assist level: Expresses complex 90% of the time/cues < 10% of the time  Function - Social Interaction Social Interaction assist level: Interacts appropriately with others with medication or extra time (anti-anxiety, antidepressant).  Function - Problem Solving Problem solving assist level: Solves basic 50 - 74% of the time/requires cueing 25 - 49% of the time  Function - Memory Memory assist level:  Recognizes or recalls 90% of the time/requires cueing < 10% of the time Patient normally able to recall (first 3 days only): Current season  Medical Problem List and Plan: 1.  Quadriplegia, numbness, gait instability secondary to GBS  -continue CIR,    -LOS extended to help better meet goals/level where daughter can manage her 2.  DVT Prophylaxis/Anticoagulation: Pharmaceutical: Lovenox  -dopplers with LLE peroneal/gastroc dvt--follow up doppler with clearing but new left soleal thrombus---continue current lovenox dosing---consider re-check prior to discharge, asymptomatic 3. Pain Management: Tylenol prn. Low dose tramadol seems to have helped        -gabapentin helpful. Increased to 400mg  tid    4. Mood: LCSW to follow for evaluation and support.   5. Neuropsych: This patient is not fully capable of making decisions on her own behalf. 6. Skin/Wound Care: Routine pressure relief measures.   7. Fluids/Electrolytes/Nutrition: Monitor I/O. Poor appetite likely colon Ca/diet  - re-check labs Thursday    -encourage PO.       8.  Neurogenic bladder/Citrobacter Koseri UTI:   -had spontaneous void last night when up on BSC  -  urecholine -- 25mg  tid---increase to QID  -sens to Bactrim, afebrile  -  flomax     -timed voids. Up to commode/toilet to empty 9. Colon cancer with liver mets: No complaints of pain.    10. UGIB with ABLA: H/H stable and anemia resolving. Continue to monitor for any recurrent signs of bleeding.   Hgb 10.4 with general trend upward-  -encourage fluids and ongoing acclimation.     LOS (Days) 22 A FACE TO FACE EVALUATION WAS PERFORMED  Keone Kamer T 11/18/2015, 9:06 AM

## 2015-11-19 ENCOUNTER — Inpatient Hospital Stay (HOSPITAL_COMMUNITY): Payer: Medicare Other | Admitting: Physical Therapy

## 2015-11-19 ENCOUNTER — Inpatient Hospital Stay (HOSPITAL_COMMUNITY): Payer: Medicare Other

## 2015-11-19 DIAGNOSIS — F4323 Adjustment disorder with mixed anxiety and depressed mood: Secondary | ICD-10-CM | POA: Insufficient documentation

## 2015-11-19 LAB — BASIC METABOLIC PANEL
Anion gap: 8 (ref 5–15)
BUN: 12 mg/dL (ref 6–20)
CALCIUM: 9 mg/dL (ref 8.9–10.3)
CO2: 24 mmol/L (ref 22–32)
CREATININE: 0.62 mg/dL (ref 0.44–1.00)
Chloride: 107 mmol/L (ref 101–111)
GFR calc Af Amer: >60 mL/min (ref >60)
GFR calc non Af Amer: >60 mL/min (ref >60)
GLUCOSE: 90 mg/dL (ref 65–99)
Potassium: 4.4 mmol/L (ref 3.5–5.1)
Sodium: 139 mmol/L (ref 135–145)

## 2015-11-19 LAB — CBC
HEMATOCRIT: 35.6 % — AB (ref 36.0–46.0)
Hemoglobin: 11 g/dL — ABNORMAL LOW (ref 12.0–15.0)
MCH: 28.1 pg (ref 26.0–34.0)
MCHC: 30.9 g/dL (ref 30.0–36.0)
MCV: 91 fL (ref 78.0–100.0)
Platelets: 262 10*3/uL (ref 150–400)
RBC: 3.91 MIL/uL (ref 3.87–5.11)
RDW: 16.3 % — AB (ref 11.5–15.5)
WBC: 3.9 10*3/uL — ABNORMAL LOW (ref 4.0–10.5)

## 2015-11-19 NOTE — Progress Notes (Signed)
Occupational Therapy Session Note  Patient Details  Name: Cynthia [name] MRN: [id number] Date of Birth: 10-31-1935  Today's Date: 11/19/2015 OT Individual Time: 1000-1130 OT Individual Time Calculation (min): 90 min    Short Term Goals: Week 3:  OT Short Term Goal 1 (Week 3): Pt will sit EOB with mod A for 10 mins in preparation for performing UB dressing tasks OT Short Term Goal 2 (Week 3): Pt will perform drop arm BSC SBT transfer with max A OT Short Term Goal 3 (Week 3): Pt will perform LB bathing tasks with mod A at bed level OT Short Term Goal 4 (Week 3): Pt will direct care when requiring assistance for positioning and bed mobility with min verbal cues  Skilled Therapeutic Interventions/Progress Updates:    Pt resting in w/c upon arrival and requested to return to bed because her legs were "hurting." Pt performed sliding board transfer w/c>bed (slight incline) with max A. Pt required max A for sit>supine in bed.  Pt engaged in bed mobility activities and BUE functional tasks while resting in bed.  Pt transferred to drop arm BSC with mod A.  Pt was able to void and have a BM while sitting on BSC with supervision.  Pt required tot A + 2 for toileting tasks and max A for SBT to bed.  Pt performed rolling in bed to facilitate donning of brief and pants.  Focus on functional transfers, bed mobility, BUE use, directing care, and safety awareness to increase independence with BADLs and decrease burden of care.   Therapy Documentation Precautions:  Precautions Precautions: Fall Precaution Comments: quadriparesis, move BLE very carefully due to pain Restrictions Weight Bearing Restrictions: No   Pain:  Pt c/o increased LLE/calf discomfort with activity and when assistance provided to lift leg into bed  See Function Navigator for Current Functional Status.   Therapy/Group: Individual Therapy  Leroy Libman 11/19/2015, 12:06 PM

## 2015-11-19 NOTE — Progress Notes (Signed)
Subjective/Complaints: Has continently emptied bladder over the last 24 hours with minimal residual. Has had 3 bm's as well. Hands seemed to be less tight last night and this morning  ROS: Pt denies fever, rash/itching, headache, blurred or double vision, nausea, vomiting, abdominal pain, diarrhea, chest pain, shortness of breath, palpitations, dysuria, dizziness,  Objective: Vital Signs: Blood pressure 112/60, pulse 89, temperature 98.2 F (36.8 C), temperature source Oral, resp. rate 18, height 5' 2"  (1.575 m), weight 53.5 kg (117 lb 15.1 oz), SpO2 97 %. No results found. Results for orders placed or performed during the hospital encounter of 10/27/15 (from the past 72 hour(s))  CBC     Status: Abnormal   Collection Time: 11/19/15  5:11 AM  Result Value Ref Range   WBC 3.9 (L) 4.0 - 10.5 K/uL   RBC 3.91 3.87 - 5.11 MIL/uL   Hemoglobin 11.0 (L) 12.0 - 15.0 g/dL   HCT 35.6 (L) 36.0 - 46.0 %   MCV 91.0 78.0 - 100.0 fL   MCH 28.1 26.0 - 34.0 pg   MCHC 30.9 30.0 - 36.0 g/dL   RDW 16.3 (H) 11.5 - 15.5 %   Platelets 262 150 - 400 K/uL  Basic metabolic panel     Status: None   Collection Time: 11/19/15  5:11 AM  Result Value Ref Range   Sodium 139 135 - 145 mmol/L   Potassium 4.4 3.5 - 5.1 mmol/L   Chloride 107 101 - 111 mmol/L   CO2 24 22 - 32 mmol/L   Glucose, Bld 90 65 - 99 mg/dL   BUN 12 6 - 20 mg/dL   Creatinine, Ser 0.62 0.44 - 1.00 mg/dL   Calcium 9.0 8.9 - 10.3 mg/dL   GFR calc non Af Amer >60 >60 mL/min   GFR calc Af Amer >60 >60 mL/min    Comment: (NOTE) The eGFR has been calculated using the CKD EPI equation. This calculation has not been validated in all clinical situations. eGFR's persistently <60 mL/min signify possible Chronic Kidney Disease.    Anion gap 8 5 - 15     HEENT: normal Cardio: RRR Resp: CTA B/L GI: BS positive and NT, ND Extremity:  No Edema Skin:   Intact Neuro: Alert/Oriented, Abnormal Sensory absent LT and Proprio in BLE below knees, and  Abnormal Motor 3+ to 4 LUE, 3 to 4-/5 RUE----improving grip, 2- B hip ext KE tr-1,  APF trace to 1/5--seems to wiggle toes more. Sensation limited in LT/pain sensation in stocking glove distribution in both legs. Has little sense of gross touch in either foot. Hands/UE's with similar pattern but much less affected   Musc/Skel:  Full ROM both ue and lower ext. No edema Gen Fatigued but NAD Psych: appropriate  Assessment/Plan: 1. 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 Physiatrist is providing close team supervision and 24 hour management of active medical problems listed below. Physiatrist and rehab team continue to assess barriers to discharge/monitor patient progress toward functional and medical goals. FIM: Function - Bathing Bathing activity did not occur: N/A Position: Bed Body parts bathed by patient: Right arm, Left arm, Chest, Abdomen (LB bathing deferred to nursing staff) Body parts bathed by helper: Right arm, Left arm, Chest, Abdomen, Front perineal area, Buttocks, Right upper leg, Left upper leg, Right lower leg, Left lower leg, Back (per nursing total assist) Bathing not applicable: Front perineal area, Buttocks, Right upper  leg, Left upper leg, Right lower leg, Left lower leg Assist Level: 2 helpers (per nursing)  Function- Upper Body Dressing/Undressing Upper body dressing/undressing activity did not occur: Refused What is the patient wearing?: Pull over shirt/dress Pull over shirt/dress - Perfomed by patient: Thread/unthread right sleeve, Thread/unthread left sleeve, Put head through opening, Pull shirt over trunk Pull over shirt/dress - Perfomed by helper: Pull shirt over trunk, Thread/unthread right sleeve, Thread/unthread left sleeve Assist Level: Set up Set up : To obtain clothing/put away Function - Lower Body Dressing/Undressing What is the patient  wearing?: Underwear, Pants, Ted Hose, Non-skid slipper socks Position: Bed Underwear - Performed by helper: Thread/unthread right underwear leg, Thread/unthread left underwear leg, Pull underwear up/down Pants- Performed by helper: Thread/unthread right pants leg, Thread/unthread left pants leg, Pull pants up/down Non-skid slipper socks- Performed by helper: Don/doff right sock, Don/doff left sock Shoes - Performed by helper: Don/doff right shoe, Don/doff left shoe, Fasten right, Fasten left AFO - Performed by helper: Don/doff right AFO, Don/doff left AFO TED Hose - Performed by helper: Don/doff right TED hose, Don/doff left TED hose Assist for footwear: Dependant Assist for lower body dressing: 2 Helpers  Function - Toileting Toileting activity did not occur: No continent bowel/bladder event Toileting steps completed by patient: Adjust clothing prior to toileting, Performs perineal hygiene, Adjust clothing after toileting Toileting steps completed by helper: Adjust clothing prior to toileting, Performs perineal hygiene, Adjust clothing after toileting Assist level: Touching or steadying assistance (Pt.75%)  Function - Air cabin crew transfer activity did not occur: Safety/medical concerns Toilet transfer assistive device: Drop arm commode, Sliding board Assist level to toilet: Maximal assist (Pt 25 - 49%/lift and lower) Assist level from toilet: Maximal assist (Pt 25 - 49%/lift and lower)  Function - Chair/bed transfer Chair/bed transfer activity did not occur: Safety/medical concerns (unable to tolerate sitting upright EOB ) Chair/bed transfer method: Lateral scoot Chair/bed transfer assist level: 2 helpers Chair/bed transfer assistive device: Mechanical lift Mechanical lift: Stedy Chair/bed transfer details: Tactile cues for weight shifting, Verbal cues for sequencing, Verbal cues for technique, Manual facilitation for placement, Manual facilitation for weight  shifting  Function - Locomotion: Wheelchair Will patient use wheelchair at discharge?: Yes Type: Manual Wheelchair activity did not occur: Safety/medical concerns Max wheelchair distance: 100 Assist Level: Supervision or verbal cues Assist Level: Supervision or verbal cues Assist Level: Supervision or verbal cues Turns around,maneuvers to table,bed, and toilet,negotiates 3% grade,maneuvers on rugs and over doorsills: No Function - Locomotion: Ambulation Ambulation activity did not occur: Safety/medical concerns  Function - Comprehension Comprehension: Auditory Comprehension assist level: Follows complex conversation/direction with no assist  Function - Expression Expression: Verbal Expression assist level: Expresses complex 90% of the time/cues < 10% of the time  Function - Social Interaction Social Interaction assist level: Interacts appropriately with others with medication or extra time (anti-anxiety, antidepressant).  Function - Problem Solving Problem solving assist level: Solves complex problems: With extra time  Function - Memory Memory assist level: Recognizes or recalls 90% of the time/requires cueing < 10% of the time Patient normally able to recall (first 3 days only): Current season  Medical Problem List and Plan: 1.  Quadriplegia, numbness, gait instability secondary to GBS  -continue CIR,    -LOS extended to help better meet goals/level where daughter can manage her 2.  DVT Prophylaxis/Anticoagulation: Pharmaceutical: Lovenox  -dopplers with LLE peroneal/gastroc dvt--follow up doppler with clearing but new left soleal thrombus---continue current lovenox dosing---consider re-check prior to discharge, asymptomatic 3. Pain Management:  Tylenol prn. Low dose tramadol seems to have helped        -gabapentin helpful. Increased to 471m tid    4. Mood: LCSW to follow for evaluation and support.   5. Neuropsych: This patient is not fully capable of making decisions on  her own behalf. 6. Skin/Wound Care: Routine pressure relief measures.   7. Fluids/Electrolytes/Nutrition: Monitor I/O. Poor appetite likely colon Ca/diet  -I personally reviewed the patient's labs today. BMET normal    -encourage PO.       8.  Neurogenic bladder/Citrobacter Koseri UTI:   -is continently voiding!!  -  Urecholine- continue at 228m  QID  -sens to Bactrim, afebrile  -  flomax     -timed voids. Up to commode/toilet to empty 9. Colon cancer with liver mets: No complaints of pain.    10. UGIB with ABLA: H/H stable and anemia resolving. Continue to monitor for any recurrent signs of bleeding.   -encourage fluids and ongoing acclimation.  -hgb 11.0. Continues to trend up     LOS (Days) 23 A FACE TO FACE EVALUATION WAS PERFORMED  Anton Cheramie T 11/19/2015, 9:13 AM

## 2015-11-19 NOTE — Progress Notes (Signed)
Speech Language Pathology Session Note & Discharge Summary  Patient Details  Name: Cynthia [name] MRN: [id number] Date of Birth: 1936/06/23  Today's Date: 11/19/2015 SLP Individual Time: 1230-1250 SLP Individual Time Calculation (min): 20 min   Skilled Therapeutic Interventions:  Skilled treatment session focused on dysphagia goals. SLP facilitated session by providing skilled observation with lunch meal of Dys. 3 textures with thin liquids without use of a chin tuck. Patient consumed meal without overt s/s of aspiration, therefore, recommend to discontinue use of a chin tuck with meals. Patient continues to report she prefers Dys. 3 textures for energy conservation but may consume snacks of regular textures. Patient has met all LTGs at this time, therefore, patient will be discharged from skilled SLP caseload. Patient verbalized understanding and agreement. Patient left upright in bed with all needs within reach.   Patient has met 2 of 2 long term goals.  Patient to discharge at overall Modified Independent level.   Reasons goals not met: N/A    Clinical Impression/Discharge Summary: Patient has made functional gains and has met 2 of 2 LTG's this admission due to improved swallowing function. Currently, patient is consuming Dys. 3 textures with thin liquids via straw without overt s/s of aspiration. Patient may consume regular textures but prefers Dys, 3 textures for energy conservation. Patient has met all goals and all patient/family education is complete, therefore, skilled SLP intervention is no longer warranted.   Care Partner:  Caregiver Able to Provide Assistance: Yes     Recommendation:  None      Equipment: N/A    Reasons for discharge: Treatment goals met   Patient/Family Agrees with Progress Made and Goals Achieved: Yes   Function:  Eating Eating   Modified Consistency Diet: Yes Eating Assist Level: Set up assist for;Assistive Device;Swallowing techniques: self  managed   Eating Set Up Assist For: Opening containers       Cognition Comprehension Comprehension assist level: Follows complex conversation/direction with no assist  Expression   Expression assist level: Expresses complex 90% of the time/cues < 10% of the time  Social Interaction Social Interaction assist level: Interacts appropriately with others with medication or extra time (anti-anxiety, antidepressant).  Problem Solving Problem solving assist level: Solves complex problems: With extra time  Memory Memory assist level: Recognizes or recalls 90% of the time/requires cueing < 10% of the time   Jackee Glasner 11/19/2015, 4:01 PM

## 2015-11-19 NOTE — Progress Notes (Signed)
RN and Tech got patient on [facility] , patient voided and had medium size soft BM. PVR was 64mL so patient was not cath.

## 2015-11-19 NOTE — Consult Note (Signed)
PSYCHODIAGNOSTIC EVALUATION - CONFIDENTIAL [facility]   [name] is a 80 year old woman, who was seen for an initial psychodiagnostic evaluation in the setting of Guillain Barre Syndrome to assess for potential depression, anxiety, or other mental illness.    During the current session, [name] reported having some days that were physically better than others, but also acknowledged fluctuating mood that seems to be related to "discouragement" about physical difficulties.  She further commented that some days her numbness and tingling are worse.  She was tearful when talking about her daughter and stated that she worries about the burden that she is placing on her daughter; she seemed to be holding a great deal of guilt associated with that.  [name] denied depressive feelings, but stated that she is "upset" about the long road to recovery and feels "aggravated" in general about her situation.  She denied suicidal ideation.  She stated that she has maintained motivation to participate in various therapies and often uses prayer to cope with challenges.  She also described using deep breathing to help her through difficult times; proper deep breathing techniques were taught during the session.  [name] seemed to light up when discussing her extended family and she noted that she is looking forward to getting back to being active in her church.  [name] felt that if she could continue to adjust her thought processes, then she would be able to cope with being less independent.    IMPRESSION:  [name] seems to be experiencing adjustment disorder with mixed anxiety and depression.  Time was spent during today's session encouraging [name] to continue recognizing and celebrating small accomplishments that she is making and in encouraging her to be aware of her tendency to experience stress in anticipation of situations that she perceives will  be difficult.  She was encouraged to continue to utilize deep breathing techniques.  In light of her reaction to talking about her family, staff members may find that engaging her in a discussion about family may serve to distract her from anticipatory anxiety and she may find that she is able to more successfully complete activities.  Continued follow-up with the neuropsychologist could be provided should the treatment team feel that it would be helpful in care management.    DIAGNOSIS:   Adjustment disorder with mixed anxiety and depressed mood  Marlane Hatcher, Psy.D.  Clinical Neuropsychologist

## 2015-11-19 NOTE — Progress Notes (Addendum)
Physical Therapy Session Note  Patient Details  Name: Cynthia [name] MRN: [id number] Date of Birth: November 27, 1935  Today's Date: 11/19/2015 PT Individual Time: 0800-0900 and 1305-1405 PT Individual Time Calculation (min): 60 min and 60 min   Short Term Goals: Week 4:  PT Short Term Goal 1 (Week 4): = LTGs   Skilled Therapeutic Interventions/Progress Updates:   Treatment 1: Patient in bed, performed rolling using rails with S-min A for LB dressing and total A to don TED hose, aircasts, and shoes. Performed heel cord and hamstring stretches BLE x 60 sec each stretch, increased resistance noted in LLE to PROM. Patient transferred to sitting edge of bed using rail with min A for positioning LLE and increased time to scoot to edge of bed. Performed sit <> stand via Stedy with +2A and patient positioned in perched sitting on Stedy for BLE WB in front of sink to brush teeth, comb hair, and wash face with single or no UE support on Stedy, supervision-min guard. Patient propelled wheelchair using BUE with gloves donned with supervision and increased time in hallway. Patient tolerated standing frame x 11 min using mirror for visual feedback for trunk/hip postural control with increased RLE weightbearing and patient unable to correct without assistance. Patient tolerated well despite increased tingling in BLE. Patient set up for breakfast meal and left in wheelchair with needs in reach.   Treatment 2: Patient in bed upon arrival, donned shoes total A. Patient transferred to edge of bed using rail with supervision and increased time, min cues for technique to bring BLE off bed. Performed slide board transfer with focus on patient initiating forward lean and head hips relationship, min A overall and assist to place slide board but patient able to remove slide board for first time. Donned WC gloves and patient propelled to ortho gym with supervision and increased time, cues to slow down for improved accuracy and  coordination. Performed simulated car transfer to [place] height using slide board with max A and assist to bring BLE in/out of car, patient leaning back and required max A to prevent LOB forward off slide board. Patient stating, "I still don't get why I need to lean forward, I'm scared of falling," extensive education provided to patient and daughter with focus on demonstration and technique for transfers with forward lean. Patient and daughter verbalized understanding. Discussed patient's progress and continued discharge planning with patient and daughter including possible medical alert button or keeping cell phone on her at all times. Patient left sitting in wheelchair with call bell in reach and daughter present.   Therapy Documentation Precautions:  Precautions Precautions: Fall Precaution Comments: quadriparesis, move BLE very carefully due to pain Restrictions Weight Bearing Restrictions: No Pain:  10/10 tingling in BLE  See Function Navigator for Current Functional Status.   Therapy/Group: Individual Therapy  Laretta Alstrom 11/19/2015, 8:51 AM

## 2015-11-20 ENCOUNTER — Inpatient Hospital Stay (HOSPITAL_COMMUNITY): Payer: Self-pay | Admitting: Physical Therapy

## 2015-11-20 ENCOUNTER — Inpatient Hospital Stay (HOSPITAL_COMMUNITY): Payer: Medicare Other | Admitting: Physical Therapy

## 2015-11-20 ENCOUNTER — Inpatient Hospital Stay (HOSPITAL_COMMUNITY): Payer: Medicare Other

## 2015-11-20 ENCOUNTER — Inpatient Hospital Stay (HOSPITAL_COMMUNITY): Payer: Medicare Other | Admitting: Occupational Therapy

## 2015-11-20 NOTE — Progress Notes (Signed)
Occupational Therapy Session Note  Patient Details  Name: Cynthia [name] MRN: [id number] Date of Birth: [birth year]  Today's Date: 11/20/2015 OT Individual Time:  - 1445=1535 (50 min)      Short Term Goals: Week 1:  OT Short Term Goal 1 (Week 1): Pt will tolerate 7 minutes of functional task with no more than 2 rest breaks in order to increase endurance for functional tasks.  OT Short Term Goal 1 - Progress (Week 1): Met OT Short Term Goal 2 (Week 1): Pt will perform UB bathing  in supported position with min A in order to increase I with self care.  OT Short Term Goal 2 - Progress (Week 1): Discontinued (comment) (Pt receiving nursing night baths at present) OT Short Term Goal 3 (Week 1): Pt will perform toilet transfer with assistance of 1 person in order to decrease level of assist for functional transfers.  OT Short Term Goal 3 - Progress (Week 1): Progressing toward goal OT Short Term Goal 4 (Week 1): Pt willl tolerate sitting wheelchair for functional tasks at sink for 20 minutes or greater in order to increase activity tolerance.  OT Short Term Goal 4 - Progress (Week 1): Met Week 2:  OT Short Term Goal 1 (Week 2): Pt will perform toilet/drop arm BSC transfer with assistance of 1 person (max A) OT Short Term Goal 1 - Progress (Week 2): Progressing toward goal OT Short Term Goal 2 (Week 2): Pt will sit EOB with mod A for 10 mins in prepearation for performing UB dressing tasks OT Short Term Goal 2 - Progress (Week 2): Progressing toward goal OT Short Term Goal 3 (Week 2): Pt will don pull over shirt with min A in supported sitting OT Short Term Goal 3 - Progress (Week 2): Met OT Short Term Goal 4 (Week 2): Pt will brush teeth with supervision/set up. OT Short Term Goal 4 - Progress (Week 2): Met Week 3:  OT Short Term Goal 1 (Week 3): Pt will sit EOB with mod A for 10 mins in preparation for performing UB dressing tasks OT Short Term Goal 2 (Week 3): Pt will perform drop arm BSC  SBT transfer with max A OT Short Term Goal 3 (Week 3): Pt will perform LB bathing tasks with mod A at bed level OT Short Term Goal 4 (Week 3): Pt will direct care when requiring assistance for positioning and bed mobility with min verbal cues  Skilled Therapeutic Interventions/Progress Updates:  . Focus on bed mobility, sitting balance, BUE therapeutic activity, HEP for Tendon gliding.  Pt. Went from supine to sit with mod assist.  Daughter, [name] assisted with LE.  Sat EOB with no assistance for balance.  Engaged in intrinsic hand exercises.  Pt performed with decreased control and some hyper mobility at the the MCP joint.  Gave hand out for pt to practice on her own.   Pt returned to supine position with mod assist.    Remained in bed with  call bell,phone within reach.     Therapy Documentation Precautions:  Precautions Precautions: Fall Precaution Comments: quadriparesis, move BLE very carefully due to pain Restrictions Weight Bearing Restrictions: No       Pain: Pain Assessment Pain Assessment: 0-10 Pain Score: 10-Worst pain ever Pain Type: Neuropathic pain Pain Location: Hand Pain Orientation: Right;Left Pain Descriptors / Indicators: Tingling Pain Onset: On-going Pain Intervention(s): Rest (ran under hot water)          See Function Navigator for Current  Functional Status.   Therapy/Group: Individual Therapy  Lisa Roca 11/20/2015, 1:00 PM

## 2015-11-20 NOTE — Progress Notes (Addendum)
Physical Therapy Session Note  Patient Details  Name: Cynthia [name] MRN: [id number] Date of Birth: 05-15-1936  Today's Date: 11/20/2015 PT Individual Time: 0939-1010 PT Individual Time Calculation (min): 31 min   Short Term Goals: Week 4:  PT Short Term Goal 1 (Week 4): = LTGs   Skilled Therapeutic Interventions/Progress Updates:   Pt received in bed in sidelying after OT session.  Pt reporting pain in hands and LE but agreeable to participate.  Pt performed rolling to R side and supine > sit with bed rail and supervision.  Pt able to maintain sitting balance EOB and perform LAQ to allow therapist to doff socks, don air casts and bilat shoes.  Pt performed sit <> stand training and LE strengthening, motor planning and sustained activation training with sit > stand from bed with Stedy and max A at pelvis to bring COG over BOS and lifting assistance.  Pt performed trunk control and dynamic sitting balance training perched on Stedy during bilat UE dynamic movements: over head press, scap retraction rows, ABD and PNF patterns x 12 reps each with min A at trunk to maintain shoulders over pelvis for increased abdominal activation.  Performed sit <> stand x 3 times from [facility] with max lifting assistance and verbal/tactile cues to maintain LE activation while lifting one UE off of Stedy for increased activation of LE.  During rest breaks pt reporting SOB: Sp02: 100%, HR: 96-101 bpm; allowed time to perform deep breathing before next exercise.  Pt positioned in w/c at end of session with all items within reach.   Therapy Documentation Precautions:  Precautions Precautions: Fall Precaution Comments: quadriparesis, move BLE very carefully due to pain Restrictions Weight Bearing Restrictions: No Vital Signs: Therapy Vitals Temp: 98.4 F (36.9 C) Temp Source: Oral Pulse Rate: 77 Resp: 19 BP: 122/67 mmHg Patient Position (if appropriate): Lying Oxygen Therapy SpO2: 97 % O2 Device: Not  Delivered Pain: Pain Assessment Pain Assessment: No/denies pain   See Function Navigator for Current Functional Status.   Therapy/Group: Individual Therapy  Raylene Everts Faucette 11/20/2015, 10:20 AM

## 2015-11-20 NOTE — Progress Notes (Signed)
Physical Therapy Session Note  Patient Details  Name: Cynthia [name] MRN: [id number] Date of Birth: 06-05-1936  Today's Date: 11/20/2015 PT Individual Time: 1020-1100 and 1130-1205 PT Individual Time Calculation (min): 40 min and 35 min  Short Term Goals: Week 4:  PT Short Term Goal 1 (Week 4): = LTGs   Skilled Therapeutic Interventions/Progress Updates:   Treatment 1: Patient in wheelchair, c/o increased tingling pain BUE. In gym, patient stating she wanted to put her hands in something warm, therefore positioned at sink in wheelchair and washed hands in warm water with supervision, patient able to retrieve soap and paper towels with forward lean. Patient propelled wheelchair using BUE x 150 ft with supervision and increased time. Performed squat pivot transfer wheelchair <> NuStep with max A. Patient performed NuStep using BLE only for neuromuscular re-ed at level 1 x 10 min total with break at 5 min with theraband around thighs to prevent hip abduction. Patient requested to return to bed, performed uphill slide board transfer back to bed with mod A and transferred sit > supine with mod A to lift BLE in bed. Patient able to reposition higher in bed using bed rails with PT stabilizing BLE in hooklying position. Patient left semi reclined in bed with needs within reach and bed alarm on.   Treatment 2: Patient in bed with daughter [name] present for hands-on family training for transfers with focus on board placement, wheelchair setup and positioning, sequencing, and technique. Patient transferred supine > sit using rail with min verbal cues to bring knees toward chest to bring off bed with supervision. Patient performed slide board transfer to bed with [name] placing/stabilizing board and supervision overall, patient able to remove slide board. Propelled wheelchair room <> ADL apartment with supervision and increased time. Performed slide board transfer wheelchair <> couch in ADL apartment to  simulate home environment with supervision to couch downhill and daughter providing mod A uphill back to wheelchair with mod questioning cues from PT for setup, foot placement, and technique. Daughter provided return demonstration for wheelchair parts management with supervision and instructed in how to fold up wheelchair to place in car. Patient returned to room in wheelchair with daughter reporting being pleased at patient's progress and left in room with all needs in reach.    Therapy Documentation Precautions:  Precautions Precautions: Fall Precaution Comments: quadriparesis, move BLE very carefully due to pain Restrictions Weight Bearing Restrictions: No Pain: Pain Assessment Pain Assessment: 0-10 Pain Score: 10-Worst pain ever Pain Type: Neuropathic pain Pain Location: Hand Pain Orientation: Right;Left Pain Descriptors / Indicators: Tingling Pain Onset: On-going Pain Intervention(s): Rest (ran under hot water)   See Function Navigator for Current Functional Status.   Therapy/Group: Individual Therapy  Laretta Alstrom 11/20/2015, 11:23 AM

## 2015-11-20 NOTE — Progress Notes (Signed)
Subjective/Complaints: Getting stronger. Happy with progress. Able to use arms more with transfers.   ROS: Pt denies fever, rash/itching, headache, blurred or double vision, nausea, vomiting, abdominal pain, diarrhea, chest pain, shortness of breath, palpitations, dysuria, dizziness,  Objective: Vital Signs: Blood pressure 122/67, pulse 77, temperature 98.4 F (36.9 C), temperature source Oral, resp. rate 19, height _0  (1.575 m), weight 54 kg (119 lb 0.8 oz), SpO2 97 %. No results found. Results for orders placed or performed during the hospital encounter of 10/27/15 (from the past 72 hour(s))  CBC     Status: Abnormal   Collection Time: 11/19/15  5:11 AM  Result Value Ref Range   WBC 3.9 (L) 4.0 - 10.5 K/uL   RBC 3.91 3.87 - 5.11 MIL/uL   Hemoglobin 11.0 (L) 12.0 - 15.0 g/dL   HCT 35.6 (L) 36.0 - 46.0 %   MCV 91.0 78.0 - 100.0 fL   MCH 28.1 26.0 - 34.0 pg   MCHC 30.9 30.0 - 36.0 g/dL   RDW 16.3 (H) 11.5 - 15.5 %   Platelets 262 150 - 400 K/uL  Basic metabolic panel     Status: None   Collection Time: 11/19/15  5:11 AM  Result Value Ref Range   Sodium 139 135 - 145 mmol/L   Potassium 4.4 3.5 - 5.1 mmol/L   Chloride 107 101 - 111 mmol/L   CO2 24 22 - 32 mmol/L   Glucose, Bld 90 65 - 99 mg/dL   BUN 12 6 - 20 mg/dL   Creatinine, Ser 0.62 0.44 - 1.00 mg/dL   Calcium 9.0 8.9 - 10.3 mg/dL   GFR calc non Af Amer >60 >60 mL/min   GFR calc Af Amer >60 >60 mL/min    Comment: (NOTE) The eGFR has been calculated using the CKD EPI equation. This calculation has not been validated in all clinical situations. eGFR's persistently <60 mL/min signify possible Chronic Kidney Disease.    Anion gap 8 5 - 15     HEENT: normal Cardio: RRR Resp: CTA B/L GI: BS positive and NT, ND Extremity:  No Edema Skin:   Intact Neuro: Alert/Oriented, Abnormal Sensory absent LT and Proprio in BLE below knees, and Abnormal Motor 3+ to 4 LUE, 3 to 4-/5 RUE----improving grip, 2- B hip ext KE tr-1,   APF trace to 1/5--seems to wiggle toes more. Sensation limited in LT/pain sensation in stocking glove distribution in both legs. Has little sense of gross touch in either foot. Hands/UE's with similar pattern but much less affected Musc/Skel:  Full ROM both ue and lower ext. No edema Gen Fatigued but NAD Psych: appropriate  Assessment/Plan: 1. 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 Physiatrist is providing close team supervision and 24 hour management of active medical problems listed below. Physiatrist and rehab team continue to assess barriers to discharge/monitor patient progress toward functional and medical goals. FIM: Function - Bathing Bathing activity did not occur: N/A Position: Wheelchair/chair at sink Body parts bathed by patient: Right arm, Left arm, Chest, Abdomen Body parts bathed by helper: Front perineal area, Buttocks, Right upper leg, Left upper leg, Right lower leg, Left lower leg Bathing not applicable: Front perineal area, Buttocks, Right upper leg, Left upper leg, Right lower leg, Left lower leg Assist Level: 2 helpers (per nursing)  Function- Upper Body Dressing/Undressing Upper body dressing/undressing activity did not occur: Refused What is the patient  wearing?: Pull over shirt/dress Pull over shirt/dress - Perfomed by patient: Thread/unthread right sleeve, Thread/unthread left sleeve, Put head through opening, Pull shirt over trunk Pull over shirt/dress - Perfomed by helper: Pull shirt over trunk, Thread/unthread right sleeve, Thread/unthread left sleeve Assist Level: Set up Set up : To obtain clothing/put away Function - Lower Body Dressing/Undressing What is the patient wearing?: Underwear, Pants, Ted Hose, Non-skid slipper socks Position: Bed Underwear - Performed by helper: Thread/unthread right underwear leg, Thread/unthread left  underwear leg, Pull underwear up/down Pants- Performed by helper: Thread/unthread right pants leg, Thread/unthread left pants leg, Pull pants up/down Non-skid slipper socks- Performed by helper: Don/doff right sock, Don/doff left sock Shoes - Performed by helper: Don/doff right shoe, Don/doff left shoe, Fasten right, Fasten left AFO - Performed by helper: Don/doff right AFO, Don/doff left AFO TED Hose - Performed by helper: Don/doff right TED hose, Don/doff left TED hose Assist for footwear: Dependant Assist for lower body dressing: 2 Helpers  Function - Toileting Toileting activity did not occur: No continent bowel/bladder event Toileting steps completed by patient: Adjust clothing prior to toileting, Performs perineal hygiene, Adjust clothing after toileting Toileting steps completed by helper: Adjust clothing prior to toileting, Performs perineal hygiene, Adjust clothing after toileting Assist level: Touching or steadying assistance (Pt.75%)  Function - Air cabin crew transfer activity did not occur: Safety/medical concerns Toilet transfer assistive device: Drop arm commode, Sliding board Assist level to toilet: 2 helpers Assist level from toilet: 2 helpers  Function - Chair/bed transfer Chair/bed transfer activity did not occur: Safety/medical concerns (unable to tolerate sitting upright EOB ) Chair/bed transfer method: Lateral scoot Chair/bed transfer assist level: 2 helpers Chair/bed transfer assistive device: Mechanical lift Mechanical lift: Stedy Chair/bed transfer details: Tactile cues for weight shifting, Verbal cues for sequencing, Verbal cues for technique, Manual facilitation for placement, Manual facilitation for weight shifting  Function - Locomotion: Wheelchair Will patient use wheelchair at discharge?: Yes Type: Manual Wheelchair activity did not occur: Safety/medical concerns Max wheelchair distance: 200 Assist Level: Supervision or verbal cues Assist  Level: Supervision or verbal cues Assist Level: Supervision or verbal cues Turns around,maneuvers to table,bed, and toilet,negotiates 3% grade,maneuvers on rugs and over doorsills: No Function - Locomotion: Ambulation Ambulation activity did not occur: Safety/medical concerns  Function - Comprehension Comprehension: Auditory Comprehension assist level: Follows complex conversation/direction with no assist  Function - Expression Expression: Verbal Expression assist level: Expresses complex 90% of the time/cues < 10% of the time  Function - Social Interaction Social Interaction assist level: Interacts appropriately with others with medication or extra time (anti-anxiety, antidepressant).  Function - Problem Solving Problem solving assist level: Solves complex problems: With extra time  Function - Memory Memory assist level: Recognizes or recalls 90% of the time/requires cueing < 10% of the time Patient normally able to recall (first 3 days only): Current season  Medical Problem List and Plan: 1.  Quadriplegia, numbness, gait instability secondary to GBS  -continue CIR,    -LOS extended to help better meet goals/level where daughter can manage her--pt ok with it 2.  DVT Prophylaxis/Anticoagulation: Pharmaceutical: Lovenox  -dopplers with LLE peroneal/gastroc dvt--follow up doppler with clearing but new left soleal thrombus---continue current lovenox dosing---consider re-check prior to discharge, asymptomatic 3. Pain Management: Tylenol prn. Low dose tramadol seems to have helped        -gabapentin Increased to 461m tid with good results    4. Mood: LCSW to follow for evaluation and support.   5. Neuropsych: This patient  is not fully capable of making decisions on her own behalf. 6. Skin/Wound Care: Routine pressure relief measures.   7. Fluids/Electrolytes/Nutrition: Monitor I/O. Poor appetite likely colon Ca/diet  -I personally reviewed the patient's labs today. BMET normal     -encourage PO.       8.  Neurogenic bladder/Citrobacter Koseri UTI:   -is continently voiding!!  -  Urecholine- continue at 35m   QID for now  -sens to Bactrim, afebrile  -  flomax     -timed voids. Up to commode/toilet to empty 9. Colon cancer with liver mets: No complaints of pain.    10. UGIB with ABLA: H/H stable and anemia resolving. Continue to monitor for any recurrent signs of bleeding.   -encourage fluids and ongoing acclimation.  -hgb 11.0. Continues to trend up     LOS (Days) 24 A FACE TO FACE EVALUATION WAS PERFORMED  Nariya Neumeyer T 11/20/2015, 9:07 AM

## 2015-11-20 NOTE — Progress Notes (Signed)
Nutrition Follow-up   DOCUMENTATION CODES:   Severe malnutrition in context of acute illness/injury  INTERVENTION:  Continue Boost Plus po TID, each supplement provides 360 kcal and 14 grams of protein.   Encourage adequate PO intake.  NUTRITION DIAGNOSIS:   Malnutrition related to acute illness as evidenced by percent weight loss, moderate depletions of muscle mass; ongoing  GOAL:   Patient will meet greater than or equal to 90% of their needs; met  MONITOR:   PO intake, Supplement acceptance, Weight trends, Labs, I & O's, Skin  REASON FOR ASSESSMENT:   Low Braden    ASSESSMENT:   80 y.o. female with metastatic colon cancer s/p surgery and chemotherapy, recurrent C-diff, HTN, stroke with lleft sided weakness/numbness, DVT who was admitted on 10/06/15 with complaints of dizziness, paraesthesias of right arm and right leg, numbness of hands and feet, BLE weakness and difficulty with walking. She was intubated 02/26- 03/05 due to respiratory insufficiency and was treated for HCAP with Vanc/Zosyn. MRI Cervical/thoracic/lumbar spine showed severe neural foraminal stenosis R-C4, R-C5, B-C6 and L-C7 nerve roots and peripheral enhancement of distal thoracic spinal cord is well nerve roots with anterior and posterior suggestive of GBS  Meal completion has been varied from 25-100%, with most intake ~80-100%. Pt currently has Boost Plus ordered and has been refusing them most recently. RD to however continue with current orders to provide Boost to aid in caloric and protein needs especially if po intake is poor as pt reports no complaints about the supplement.   Labs and medications reviewed.   Diet Order:  DIET DYS 3 Room service appropriate?: Yes; Fluid consistency:: Thin  Skin:  Wound (see comment) (Stage I pressure ulcer on coccyx)  Last BM:  4/6  Height:   Ht Readings from Last 1 Encounters:  10/27/15 5' 2"  (1.575 m)    Weight:   Wt Readings from Last 1 Encounters:   11/20/15 119 lb 0.8 oz (54 kg)    Ideal Body Weight:  50 kg  BMI:  Body mass index is 21.77 kg/(m^2).  Estimated Nutritional Needs:   Kcal:  1600-1850  Protein:  80-90 grams  Fluid:  1.6 - 1.8 L/day  EDUCATION NEEDS:   No education needs identified at this time  Corrin Parker, MS, RD, LDN Pager # [phone] After hours/ weekend pager # [phone]

## 2015-11-20 NOTE — Progress Notes (Signed)
Occupational Therapy Session Note  Patient Details  Name: Cynthia [name] MRN: [id number] Date of Birth: 1935/12/20  Today's Date: 11/20/2015 OT Individual Time: 0700-0757 OT Individual Time Calculation (min): 57 min    Short Term Goals: Week 3:  OT Short Term Goal 1 (Week 3): Pt will sit EOB with mod A for 10 mins in preparation for performing UB dressing tasks OT Short Term Goal 2 (Week 3): Pt will perform drop arm BSC SBT transfer with max A OT Short Term Goal 3 (Week 3): Pt will perform LB bathing tasks with mod A at bed level OT Short Term Goal 4 (Week 3): Pt will direct care when requiring assistance for positioning and bed mobility with min verbal cues  Skilled Therapeutic Interventions/Progress Updates:    Pt resting in bed upon arrival.  Pt initially engaged in LB dressing tasks at bed level.  Pt initiated pulling up pants while supine but required assistance to complete task. Pt rolled onto her right side and sat EOB with supervision using bed rails.  Pt performed lateral lean to facilitate placement of sliding board and performed sliding board transfer to w/c with steady A. Pt completed UB bathing and dressing tasks with setup/supervision this morning, as well as all grooming tasks.  Pt required assistance opening salt container and placing foam on utensils before eating breakfast without assistance. Continued discharge planning and reviewed progress with pt.  Pt pleased with progress. Focus on bed mobility, functional transfers, sitting balance, BUE use, and safety awareness to increase independence with BADLs.   Therapy Documentation Precautions:  Precautions Precautions: Fall Precaution Comments: quadriparesis, move BLE very carefully due to pain Restrictions Weight Bearing Restrictions: No Pain:  Pt c/o increased tingling with activity in BLE/BUE; RN aware, emotional support  See Function Navigator for Current Functional Status.   Therapy/Group: Individual  Therapy  Leroy Libman 11/20/2015, 8:03 AM

## 2015-11-21 ENCOUNTER — Inpatient Hospital Stay (HOSPITAL_COMMUNITY): Payer: Medicare Other | Admitting: Occupational Therapy

## 2015-11-21 DIAGNOSIS — R630 Anorexia: Secondary | ICD-10-CM | POA: Insufficient documentation

## 2015-11-21 DIAGNOSIS — N319 Neuromuscular dysfunction of bladder, unspecified: Secondary | ICD-10-CM | POA: Insufficient documentation

## 2015-11-21 NOTE — Progress Notes (Signed)
Subjective/Complaints: Patient seen lying in bed this morning. She is in good spirits. She slept well overnight.  ROS: Denies CP, SOB, N/V/D   Objective: Vital Signs: Blood pressure 138/71, pulse 84, temperature 98.3 F (36.8 C), temperature source Oral, resp. rate 17, height 5' 2"  (1.575 m), weight 53.9 kg (118 lb 13.3 oz), SpO2 96 %. No results found. Results for orders placed or performed during the hospital encounter of 10/27/15 (from the past 72 hour(s))  CBC     Status: Abnormal   Collection Time: 11/19/15  5:11 AM  Result Value Ref Range   WBC 3.9 (L) 4.0 - 10.5 K/uL   RBC 3.91 3.87 - 5.11 MIL/uL   Hemoglobin 11.0 (L) 12.0 - 15.0 g/dL   HCT 35.6 (L) 36.0 - 46.0 %   MCV 91.0 78.0 - 100.0 fL   MCH 28.1 26.0 - 34.0 pg   MCHC 30.9 30.0 - 36.0 g/dL   RDW 16.3 (H) 11.5 - 15.5 %   Platelets 262 150 - 400 K/uL  Basic metabolic panel     Status: None   Collection Time: 11/19/15  5:11 AM  Result Value Ref Range   Sodium 139 135 - 145 mmol/L   Potassium 4.4 3.5 - 5.1 mmol/L   Chloride 107 101 - 111 mmol/L   CO2 24 22 - 32 mmol/L   Glucose, Bld 90 65 - 99 mg/dL   BUN 12 6 - 20 mg/dL   Creatinine, Ser 0.62 0.44 - 1.00 mg/dL   Calcium 9.0 8.9 - 10.3 mg/dL   GFR calc non Af Amer >60 >60 mL/min   GFR calc Af Amer >60 >60 mL/min    Comment: (NOTE) The eGFR has been calculated using the CKD EPI equation. This calculation has not been validated in all clinical situations. eGFR's persistently <60 mL/min signify possible Chronic Kidney Disease.    Anion gap 8 5 - 15     HEENT: Normocephalic. Atraumatic. Cardio: RRR Resp: CTA B/L GI: BS positive and NT, ND Skin:   Intact. Warm and dry. Neuro: Alert/Oriented Motor 4/5 B/l UE B/l LE 2+/5 hip flexion, knee extension, wiggle bilateral toes.  Musc/Skel:  No tenderness. No edema Gen: NAD. Vital signs reviewed. Psych: Mood, affec,t and behavior appropriate  Assessment/Plan: 1. Functional deficits secondary to Quadriplegia due  to GBS which require 3+ hours per day of interdisciplinary therapy in a comprehensive inpatient rehab setting. Physiatrist is providing close team supervision and 24 hour management of active medical problems listed below. Physiatrist and rehab team continue to assess barriers to discharge/monitor patient progress toward functional and medical goals. FIM: Function - Bathing Bathing activity did not occur: N/A Position: Wheelchair/chair at sink Body parts bathed by patient: Right arm, Left arm, Chest, Abdomen Body parts bathed by helper: Front perineal area, Buttocks, Right upper leg, Left upper leg, Right lower leg, Left lower leg Bathing not applicable: Front perineal area, Buttocks, Right upper leg, Left upper leg, Right lower leg, Left lower leg Assist Level: 2 helpers (per nursing)  Function- Upper Body Dressing/Undressing Upper body dressing/undressing activity did not occur: Refused What is the patient wearing?: Pull over shirt/dress Pull over shirt/dress - Perfomed by patient: Thread/unthread right sleeve, Thread/unthread left sleeve, Put head through opening, Pull shirt over trunk Pull over shirt/dress - Perfomed by helper: Pull shirt over trunk, Thread/unthread right sleeve, Thread/unthread left sleeve Assist Level: Set up Set up : To obtain clothing/put away Function - Lower Body Dressing/Undressing What is the patient wearing?: Underwear, Pants,  Ted Hose, Non-skid slipper socks Position: Bed Underwear - Performed by helper: Thread/unthread right underwear leg, Thread/unthread left underwear leg, Pull underwear up/down Pants- Performed by helper: Thread/unthread right pants leg, Thread/unthread left pants leg, Pull pants up/down Non-skid slipper socks- Performed by helper: Don/doff right sock, Don/doff left sock Shoes - Performed by helper: Don/doff right shoe, Don/doff left shoe, Fasten right, Fasten left AFO - Performed by helper: Don/doff right AFO, Don/doff left AFO TED Hose  - Performed by helper: Don/doff right TED hose, Don/doff left TED hose Assist for footwear: Dependant Assist for lower body dressing: 2 Helpers  Function - Toileting Toileting activity did not occur: No continent bowel/bladder event Toileting steps completed by patient: Adjust clothing prior to toileting, Performs perineal hygiene, Adjust clothing after toileting Toileting steps completed by helper: Adjust clothing prior to toileting, Performs perineal hygiene, Adjust clothing after toileting Assist level: Touching or steadying assistance (Pt.75%)  Function - Air cabin crew transfer activity did not occur: Safety/medical concerns Toilet transfer assistive device: Drop arm commode, Sliding board Assist level to toilet: 2 helpers Assist level from toilet: 2 helpers  Function - Chair/bed transfer Chair/bed transfer activity did not occur: Safety/medical concerns (unable to tolerate sitting upright EOB ) Chair/bed transfer method: Squat pivot Chair/bed transfer assist level: Maximal assist (Pt 25 - 49%/lift and lower) Chair/bed transfer assistive device: Armrests Mechanical lift: Stedy Chair/bed transfer details: Tactile cues for weight shifting, Verbal cues for sequencing, Verbal cues for technique, Manual facilitation for placement, Manual facilitation for weight shifting  Function - Locomotion: Wheelchair Will patient use wheelchair at discharge?: Yes Type: Manual Wheelchair activity did not occur: Safety/medical concerns Max wheelchair distance: 200 Assist Level: Supervision or verbal cues Assist Level: Supervision or verbal cues Assist Level: Supervision or verbal cues Turns around,maneuvers to table,bed, and toilet,negotiates 3% grade,maneuvers on rugs and over doorsills: No Function - Locomotion: Ambulation Ambulation activity did not occur: Safety/medical concerns  Function - Comprehension Comprehension: Auditory Comprehension assist level: Follows complex  conversation/direction with extra time/assistive device  Function - Expression Expression: Verbal Expression assist level: Expresses complex 90% of the time/cues < 10% of the time  Function - Social Interaction Social Interaction assist level: Interacts appropriately 90% of the time - Needs monitoring or encouragement for participation or interaction.  Function - Problem Solving Problem solving assist level: Solves complex 90% of the time/cues < 10% of the time  Function - Memory Memory assist level: Recognizes or recalls 90% of the time/requires cueing < 10% of the time Patient normally able to recall (first 3 days only): Current season  Medical Problem List and Plan: 1.  Quadriplegia, numbness, gait instability secondary to GBS  -continue CIR,    -LOS extended to help better meet goals/level where daughter can manage her--pt ok with it 2.  DVT Prophylaxis/Anticoagulation: Pharmaceutical: Lovenox  -dopplers with LLE peroneal/gastroc dvt--follow up doppler with clearing but new left soleal thrombus---continue current lovenox dosing---consider re-check prior to discharge, asymptomatic  3. Pain Management: Tylenol prn. Low dose tramadol seems to have helped  -gabapentin Increased to 457m tid with good results 4. Mood: LCSW to follow for evaluation and support.   5. Neuropsych: This patient is not fully capable of making decisions on her own behalf. 6. Skin/Wound Care: Routine pressure relief measures.   7. Fluids/Electrolytes/Nutrition: Monitor I/O. Poor appetite (? Improving) likely colon Ca/diet  -BMET normal on 4/6    -encourage PO 8.  Neurogenic bladder/Citrobacter Koseri UTI:   -continent  -Urecholine- continue at 255mQID for now  -  sens to Bactrim, afebrile, will d/c today  -flomax     -timed voids. Up to commode/toilet to empty 9. Colon cancer with liver mets: No complaints of pain.    10. UGIB with ABLA: H/H stable and anemia resolving. Continue to monitor for any  recurrent signs of bleeding.   -encourage fluids and ongoing acclimation.  -hgb 11.0 on 4/6, Continues to trend up    LOS (Days) 25 A FACE TO FACE EVALUATION WAS PERFORMED  Ankit Lorie Phenix 11/21/2015, 9:48 AM

## 2015-11-21 NOTE — Progress Notes (Signed)
Occupational Therapy Session Note  Patient Details  Name: Cynthia [name] MRN: [id number] Date of Birth: Nov 21, 1935  Today's Date: 11/21/2015 OT Individual Time: 1015-1100 OT Individual Time Calculation (min): 45 min    Short Term Goals: Week 3:  OT Short Term Goal 1 (Week 3): Pt will sit EOB with mod A for 10 mins in preparation for performing UB dressing tasks OT Short Term Goal 2 (Week 3): Pt will perform drop arm BSC SBT transfer with max A OT Short Term Goal 3 (Week 3): Pt will perform LB bathing tasks with mod A at bed level OT Short Term Goal 4 (Week 3): Pt will direct care when requiring assistance for positioning and bed mobility with min verbal cues  Skilled Therapeutic Interventions/Progress Updates: Patient participated in skilled OT as follows this session:     Bed mobility=mod A for lateral rolls;  LB dressing total A;  Edge of bed seated static unsupported balance=good;  Edge of bed seated static unsupported balance= max assist; bed to w/c sliding board transfer= Mod A & patient needed help with placement under her at beginning of transfer due to decreased strength and Edge of bed balance while completing dynamic balalnce activitiy; Grooming in w/c at sink= supervision     Therapy Documentation Precautions:  Precautions Precautions: Fall Precaution Comments: quadriparesis, move BLE very carefully due to pain Restrictions Weight Bearing Restrictions: No  Pain:"tingly in hands and legs" but did not rate and did not voice need for meds at the moment   See Function Navigator for Current Functional Status.   Therapy/Group: Individual Therapy  Alfredia Ferguson [facility] 11/21/2015, 3:28 PM

## 2015-11-22 ENCOUNTER — Inpatient Hospital Stay (HOSPITAL_COMMUNITY): Payer: Medicare Other

## 2015-11-22 NOTE — Progress Notes (Signed)
Subjective/Complaints: Patient lying in bed this morning. She states she feels like she is doing much better and is pleased with her progress.  ROS: Denies CP, SOB, N/V/D   Objective: Vital Signs: Blood pressure 110/48, pulse 78, temperature 98.2 F (36.8 C), temperature source Oral, resp. rate 16, height 5\' 2"  (1.575 m), weight 54.4 kg (119 lb 14.9 oz), SpO2 97 %. No results found. No results found for this or any previous visit (from the past 72 hour(s)).   HEENT: Normocephalic. Atraumatic. Cardio: RRR Resp: CTA B/L GI: BS positive and NT, ND Skin:   Intact. Warm and dry. Neuro: Alert/Oriented Motor 4/5 B/l UE B/l LE 2+/5 hip flexion, knee extension, wiggle bilateral toes.  Musc/Skel:  No tenderness. No edema Gen: NAD. Vital signs reviewed. Psych: Mood, affec,t and behavior appropriate  Assessment/Plan: 1. 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 Physiatrist is providing close team supervision and 24 hour management of active medical problems listed below. Physiatrist and rehab team continue to assess barriers to discharge/monitor patient progress toward functional and medical goals. FIM: Function - Bathing Bathing activity did not occur: N/A Position: Wheelchair/chair at sink Body parts bathed by patient: Right arm, Left arm, Chest, Abdomen Body parts bathed by helper: Front perineal area, Buttocks, Right upper leg, Left upper leg, Right lower leg, Left lower leg Bathing not applicable: Front perineal area, Buttocks, Right upper leg, Left upper leg, Right lower leg, Left lower leg Assist Level: 2 helpers (per nursing)  Function- Upper Body Dressing/Undressing Upper body dressing/undressing activity did not occur: Refused What is the patient wearing?: Pull over shirt/dress Pull over shirt/dress - Perfomed by patient: Thread/unthread right sleeve,  Thread/unthread left sleeve, Put head through opening, Pull shirt over trunk Pull over shirt/dress - Perfomed by helper: Pull shirt over trunk, Thread/unthread right sleeve, Thread/unthread left sleeve Assist Level: Set up Set up : To obtain clothing/put away Function - Lower Body Dressing/Undressing What is the patient wearing?: Underwear, Pants, Ted Hose, Non-skid slipper socks Position: Bed Underwear - Performed by helper: Thread/unthread right underwear leg, Thread/unthread left underwear leg, Pull underwear up/down Pants- Performed by helper: Thread/unthread right pants leg, Thread/unthread left pants leg, Pull pants up/down Non-skid slipper socks- Performed by helper: Don/doff right sock, Don/doff left sock Shoes - Performed by helper: Don/doff right shoe, Don/doff left shoe, Fasten right, Fasten left AFO - Performed by helper: Don/doff right AFO, Don/doff left AFO TED Hose - Performed by helper: Don/doff right TED hose, Don/doff left TED hose Assist for footwear: Dependant Assist for lower body dressing: 2 Helpers  Function - Toileting Toileting activity did not occur: No continent bowel/bladder event Toileting steps completed by patient: Adjust clothing prior to toileting, Performs perineal hygiene, Adjust clothing after toileting Toileting steps completed by helper: Performs perineal hygiene (only had a brief, no pants) Assist level: Touching or steadying assistance (Pt.75%)  Function - Air cabin crew transfer activity did not occur: Safety/medical concerns Toilet transfer assistive device: Drop arm commode, Sliding board Assist level to toilet: Moderate assist (Pt 50 - 74%/lift or lower) Assist level from toilet: Moderate assist (Pt 50 - 74%/lift or lower)  Function - Chair/bed transfer Chair/bed transfer activity did not occur: Safety/medical concerns (unable to tolerate sitting upright EOB ) Chair/bed transfer method: Squat pivot Chair/bed transfer assist level:  Maximal assist (Pt 25 - 49%/lift and lower) Chair/bed transfer assistive device: Armrests Mechanical lift: Stedy Chair/bed transfer details:  Tactile cues for weight shifting, Verbal cues for sequencing, Verbal cues for technique, Manual facilitation for placement, Manual facilitation for weight shifting  Function - Locomotion: Wheelchair Will patient use wheelchair at discharge?: Yes Type: Manual Wheelchair activity did not occur: Safety/medical concerns Max wheelchair distance: 200 Assist Level: Supervision or verbal cues Assist Level: Supervision or verbal cues Assist Level: Supervision or verbal cues Turns around,maneuvers to table,bed, and toilet,negotiates 3% grade,maneuvers on rugs and over doorsills: No Function - Locomotion: Ambulation Ambulation activity did not occur: Safety/medical concerns  Function - Comprehension Comprehension: Auditory Comprehension assist level: Follows complex conversation/direction with no assist  Function - Expression Expression: Verbal Expression assist level: Expresses complex ideas: With no assist  Function - Social Interaction Social Interaction assist level: Interacts appropriately with others - No medications needed.  Function - Problem Solving Problem solving assist level: Solves complex problems: Recognizes & self-corrects  Function - Memory Memory assist level: Complete Independence: No helper Patient normally able to recall (first 3 days only): Current season  Medical Problem List and Plan: 1.  Quadriplegia, numbness, gait instability secondary to GBS  -continue CIR,    -LOS extended to help better meet goals/level where daughter can manage her--pt ok with it 2.  DVT Prophylaxis/Anticoagulation: Pharmaceutical: Lovenox  -dopplers with LLE peroneal/gastroc dvt--follow up doppler with clearing but new left soleal thrombus---continue current lovenox dosing---consider re-check prior to discharge, asymptomatic  3. Pain Management:  Tylenol prn. Low dose tramadol seems to have helped  -gabapentin Increased to 400mg  tid with good results 4. Mood: LCSW to follow for evaluation and support.   5. Neuropsych: This patient is not fully capable of making decisions on her own behalf. 6. Skin/Wound Care: Routine pressure relief measures.   7. Fluids/Electrolytes/Nutrition: Monitor I/O. Poor appetite  improving, eating 100% of her diet yesterday.  -BMET normal on 4/6    -encourage PO 8.  Neurogenic bladder/Citrobacter Koseri UTI:   -continent  -Urecholine- continue at 25mg  QID for now  -sens to Bactrim, afebrile, d/ced 4/8  -flomax     -timed voids. Up to commode/toilet to empty 9. Colon cancer with liver mets: No complaints of pain.    10. UGIB with ABLA: H/H stable and anemia resolving. Continue to monitor for any recurrent signs of bleeding.   -encourage fluids and ongoing acclimation.  -hgb 11.0 on 4/6    LOS (Days) 26 A FACE TO FACE EVALUATION WAS PERFORMED  Ankit Lorie Phenix 11/22/2015, 2:35 PM

## 2015-11-22 NOTE — Progress Notes (Signed)
Physical Therapy Session Note  Patient Details  Name: Cynthia [name] MRN: [id number] Date of Birth: 27-Dec-1935  Today's Date: 11/22/2015 PT Individual Time: 1030-1100 PT Individual Time Calculation (min): 30 min   Skilled Therapeutic Interventions/Progress Updates:    D/c planning discussion with pt in regards to overall goals and pt expressing need for more family education with her daughter but she states she feels like she is making good progress. BSC transfers with slideboard with overall min to mod assist and extra time using lateral leans for management of brief and hygiene with min verbal cues for head/hips relationship. Pt able to instruct transfer independently including importance of not sliding bare-bottom on slideboard.pt request to return to bed after toileting and positioned for comfort. Pt able to assist with scooting up in bed pushing through BLE (assist to get into this position) to increase independence. All needs in reach.    Therapy Documentation Precautions:  Precautions Precautions: Fall Precaution Comments: quadriparesis, move BLE very carefully due to pain Restrictions Weight Bearing Restrictions: No   Pain:  Reporting tinging nerve pain/sensation is increased in B hands today but premedicated.    See Function Navigator for Current Functional Status.   Therapy/Group: Individual Therapy  Canary Brim Ivory Broad, PT, DPT  11/22/2015, 11:41 AM

## 2015-11-23 ENCOUNTER — Inpatient Hospital Stay (HOSPITAL_COMMUNITY): Payer: Medicare Other

## 2015-11-23 ENCOUNTER — Inpatient Hospital Stay (HOSPITAL_COMMUNITY): Payer: Medicare Other | Admitting: Occupational Therapy

## 2015-11-23 ENCOUNTER — Inpatient Hospital Stay (HOSPITAL_COMMUNITY): Payer: Medicare Other | Admitting: Physical Therapy

## 2015-11-23 NOTE — Progress Notes (Signed)
Subjective/Complaints: Overall improving. Voiding well although refused BSC early this morning. Emptying bowels. Pain stable. Continues to get stronger.   ROS: Denies CP, SOB, N/V/D   Objective: Vital Signs: Blood pressure 114/68, pulse 76, temperature 98.2 F (36.8 C), temperature source Oral, resp. rate 17, height 5\' 2"  (1.575 m), weight 54.7 kg (120 lb 9.5 oz), SpO2 97 %. No results found. No results found for this or any previous visit (from the past 72 hour(s)).   HEENT: Normocephalic. Atraumatic. Cardio: RRR Resp: CTA B/L GI: BS positive and NT, ND Skin:   Intact. Warm and dry. Neuro: Alert/Oriented Motor 4/5 B/l UE now---improved grip, Naomi coordination B/l LE 2+/5 hip flexion, knee extension, wiggle bilateral toes.  Musc/Skel:  No tenderness. No edema Gen: NAD. Vital signs reviewed. Psych: Mood, affec,t and behavior appropriate  Assessment/Plan: 1. 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 Physiatrist is providing close team supervision and 24 hour management of active medical problems listed below. Physiatrist and rehab team continue to assess barriers to discharge/monitor patient progress toward functional and medical goals. FIM: Function - Bathing Bathing activity did not occur: N/A Position: Wheelchair/chair at sink Body parts bathed by patient: Right arm, Left arm, Chest, Abdomen Body parts bathed by helper: Front perineal area, Buttocks, Right upper leg, Left upper leg, Right lower leg, Left lower leg Bathing not applicable: Front perineal area, Buttocks, Right upper leg, Left upper leg, Right lower leg, Left lower leg Assist Level: 2 helpers (per nursing)  Function- Upper Body Dressing/Undressing Upper body dressing/undressing activity did not occur: Refused What is the patient wearing?: Pull over shirt/dress Pull over shirt/dress - Perfomed  by patient: Thread/unthread right sleeve, Thread/unthread left sleeve, Put head through opening, Pull shirt over trunk Pull over shirt/dress - Perfomed by helper: Pull shirt over trunk, Thread/unthread right sleeve, Thread/unthread left sleeve Assist Level: Set up Set up : To obtain clothing/put away Function - Lower Body Dressing/Undressing What is the patient wearing?: Underwear, Pants, Ted Hose, Non-skid slipper socks Position: Bed Underwear - Performed by helper: Thread/unthread right underwear leg, Thread/unthread left underwear leg, Pull underwear up/down Pants- Performed by helper: Thread/unthread right pants leg, Thread/unthread left pants leg, Pull pants up/down Non-skid slipper socks- Performed by helper: Don/doff right sock, Don/doff left sock Shoes - Performed by helper: Don/doff right shoe, Don/doff left shoe, Fasten right, Fasten left AFO - Performed by helper: Don/doff right AFO, Don/doff left AFO TED Hose - Performed by helper: Don/doff right TED hose, Don/doff left TED hose Assist for footwear: Dependant Assist for lower body dressing: 2 Helpers  Function - Toileting Toileting activity did not occur: No continent bowel/bladder event Toileting steps completed by patient: Adjust clothing prior to toileting, Performs perineal hygiene, Adjust clothing after toileting Toileting steps completed by helper: Performs perineal hygiene (only had a brief, no pants) Assist level: Touching or steadying assistance (Pt.75%)  Function - Air cabin crew transfer activity did not occur: Safety/medical concerns Toilet transfer assistive device: Drop arm commode, Sliding board Assist level to toilet: Moderate assist (Pt 50 - 74%/lift or lower) Assist level from toilet: Moderate assist (Pt 50 - 74%/lift or lower)  Function - Chair/bed transfer Chair/bed transfer activity did not occur: Safety/medical concerns (unable to tolerate sitting upright EOB ) Chair/bed transfer method: Squat  pivot Chair/bed transfer assist level: Maximal assist (Pt 25 - 49%/lift and lower) Chair/bed transfer assistive device: Armrests Mechanical lift: Stedy Chair/bed transfer  details: Tactile cues for weight shifting, Verbal cues for sequencing, Verbal cues for technique, Manual facilitation for placement, Manual facilitation for weight shifting  Function - Locomotion: Wheelchair Will patient use wheelchair at discharge?: Yes Type: Manual Wheelchair activity did not occur: Safety/medical concerns Max wheelchair distance: 200 Assist Level: Supervision or verbal cues Assist Level: Supervision or verbal cues Assist Level: Supervision or verbal cues Turns around,maneuvers to table,bed, and toilet,negotiates 3% grade,maneuvers on rugs and over doorsills: No Function - Locomotion: Ambulation Ambulation activity did not occur: Safety/medical concerns  Function - Comprehension Comprehension: Auditory Comprehension assist level: Understands complex 90% of the time/cues 10% of the time  Function - Expression Expression: Verbal Expression assist level: Expresses basic needs/ideas: With no assist  Function - Social Interaction Social Interaction assist level: Interacts appropriately 90% of the time - Needs monitoring or encouragement for participation or interaction.  Function - Problem Solving Problem solving assist level: Solves complex 90% of the time/cues < 10% of the time  Function - Memory Memory assist level: Recognizes or recalls 90% of the time/requires cueing < 10% of the time Patient normally able to recall (first 3 days only): Current season  Medical Problem List and Plan: 1.  Quadriplegia, numbness, gait instability secondary to GBS  -continue CIR,    -LOS extended to help better meet goals/level where daughter can manage her--pt ok with it 2.  DVT Prophylaxis/Anticoagulation: Pharmaceutical: Lovenox  -dopplers with LLE peroneal/gastroc dvt--follow up doppler with clearing but  new left soleal thrombus---continue current lovenox dosing---consider re-check prior to discharge, asymptomatic  3. Pain Management: Tylenol prn. Low dose tramadol seems to have helped  -gabapentin Increased to 400mg  tid with good results 4. Mood: LCSW to follow for evaluation and support.   5. Neuropsych: This patient is not fully capable of making decisions on her own behalf. 6. Skin/Wound Care: Routine pressure relief measures.   7. Fluids/Electrolytes/Nutrition: Monitor I/O. Poor appetite  improving, eating 100% of her diet yesterday.  -BMET normal on 4/6    -encourage PO 8.  Neurogenic bladder/Citrobacter Koseri UTI:   -continent  -Urecholine- continue at 25mg  QID for now  -bactrim completed  -flomax     -timed voids. Up to commode/toilet to empty 9. Colon cancer with liver mets: No complaints of pain.    10. UGIB with ABLA: H/H stable and anemia resolving. Continue to monitor for any recurrent signs of bleeding.   -encourage fluids and ongoing acclimation.  -hgb 11.0 on 4/6  -recheck this week    LOS (Days) 27 A FACE TO FACE EVALUATION WAS PERFORMED  SWARTZ,ZACHARY T 11/23/2015, 9:01 AM

## 2015-11-23 NOTE — Progress Notes (Signed)
Patient refuses to get on the bedside commode and she was bladder scan for 201, she was educated about why she needs to get on the bedside commode but she continue to refused

## 2015-11-23 NOTE — Progress Notes (Signed)
Occupational Therapy Session Note  Patient Details  Name: Cynthia [name] MRN: [id number] Date of Birth: [birth year]  Today's Date: 11/23/2015 OT Individual Time: 0800-0900 OT Individual Time Calculation (min): 60 min    Short Term Goals: Week 3:  OT Short Term Goal 1 (Week 3): Pt will sit EOB with mod A for 10 mins in preparation for performing UB dressing tasks OT Short Term Goal 2 (Week 3): Pt will perform drop arm BSC SBT transfer with max A OT Short Term Goal 3 (Week 3): Pt will perform LB bathing tasks with mod A at bed level OT Short Term Goal 4 (Week 3): Pt will direct care when requiring assistance for positioning and bed mobility with min verbal cues  Skilled Therapeutic Interventions/Progress Updates:    Pt resting in bed upon arrival.  Pt rolls in bed using bed rails with supervision to facilitate LB dressing tasks.  Pt is now able to raise her feet adequately enough to place into her pants and initiate pulling up above her knees.  Pt performs supine>sit EOB using bed rails with supervision and performs lateral lean to facilitate placement of sliding board in preparation for transfer.  Pt performs sliding board transfer bed>w/c with supervision and removes the sliding board independently.  Pt completed all grooming tasks seated in w/c at sink without assistance. Pt propelled w/c to therapy gym and engaged in [facility] tasks including placing small pegs in peg board and stacking five tokens.  Pt exhibited less control with LUE in stacking task.  Pt completed 9 hole peg test: right-1:01, left-:59. Pt propelled her w/c back to room and postioned appropriately beside bed awaiting her next therapy.  Pt remained in w/c with all needs within reach.   Therapy Documentation Precautions:  Precautions Precautions: Fall Precaution Comments: quadriparesis, move BLE very carefully due to pain Restrictions Weight Bearing Restrictions: No Pain: Pain Assessment Pain Assessment: No/denies  pain Pain Score: 0-No pain  Pt c/o continued tingling in BLE/BUE with increasing intensity with activity  See Function Navigator for Current Functional Status.   Therapy/Group: Individual Therapy  Leroy Libman 11/23/2015, 9:03 AM

## 2015-11-23 NOTE — Progress Notes (Signed)
Occupational Therapy Session Note  Patient Details  Name: Cynthia [name] MRN: [id number] Date of Birth: 12-25-1935  Today's Date: 11/23/2015 OT Individual Time: 1000-1100 OT Individual Time Calculation (min): 60 min    Short Term Goals: Week 1:  OT Short Term Goal 1 (Week 1): Pt will tolerate 7 minutes of functional task with no more than 2 rest breaks in order to increase endurance for functional tasks.  OT Short Term Goal 1 - Progress (Week 1): Met OT Short Term Goal 2 (Week 1): Pt will perform UB bathing  in supported position with min A in order to increase I with self care.  OT Short Term Goal 2 - Progress (Week 1): Discontinued (comment) (Pt receiving nursing night baths at present) OT Short Term Goal 3 (Week 1): Pt will perform toilet transfer with assistance of 1 person in order to decrease level of assist for functional transfers.  OT Short Term Goal 3 - Progress (Week 1): Progressing toward goal OT Short Term Goal 4 (Week 1): Pt willl tolerate sitting wheelchair for functional tasks at sink for 20 minutes or greater in order to increase activity tolerance.  OT Short Term Goal 4 - Progress (Week 1): Met Week 2:  OT Short Term Goal 1 (Week 2): Pt will perform toilet/drop arm BSC transfer with assistance of 1 person (max A) OT Short Term Goal 1 - Progress (Week 2): Progressing toward goal OT Short Term Goal 2 (Week 2): Pt will sit EOB with mod A for 10 mins in prepearation for performing UB dressing tasks OT Short Term Goal 2 - Progress (Week 2): Progressing toward goal OT Short Term Goal 3 (Week 2): Pt will don pull over shirt with min A in supported sitting OT Short Term Goal 3 - Progress (Week 2): Met OT Short Term Goal 4 (Week 2): Pt will brush teeth with supervision/set up. OT Short Term Goal 4 - Progress (Week 2): Met Week 3:  OT Short Term Goal 1 (Week 3): Pt will sit EOB with mod A for 10 mins in preparation for performing UB dressing tasks OT Short Term Goal 2 (Week  3): Pt will perform drop arm BSC SBT transfer with max A OT Short Term Goal 3 (Week 3): Pt will perform LB bathing tasks with mod A at bed level OT Short Term Goal 4 (Week 3): Pt will direct care when requiring assistance for positioning and bed mobility with min verbal cues  Skilled Therapeutic Interventions/Progress Updates:    Pt. Complaining of pain in in hands 8/10 in tingling and being cold.  Pt stated she has been doing exercises with her hands.  Pt. Donned gloves with increased time (kept putting 2 fingers in one hold.  Propelled wc to gym.  Transferred to mat with Sliding Board.  Pt stabilized knees and pt did the rest of the movements (min assist on level surface.)  Engaged in sitting balance and UE Strength and ROm using  9 hole peg= right 83mn 4 sec; left= 17m 7 seconds.  Pt dropped pegs several times during the test.  Engaged in Reaching activities.  Pt. Scooted to right on wc.  Does better on silk pillow case.  Propelled wc back to room.     Therapy Documentation Precautions:  Precautions Precautions: Fall Precaution Comments: quadriparesis, move BLE very carefully due to pain Restrictions Weight Bearing Restrictions: No    Pain:  Hands 8/10           See Function Navigator for  Current Functional Status.   Therapy/Group: Individual Therapy  Lisa Roca 11/23/2015, 2:54 PM

## 2015-11-23 NOTE — Progress Notes (Signed)
Social Work Patient ID: Cynthia [name], female   DOB: Aug 17, 1935, 80 y.o.   MRN: [id number]  Met with pt and daughter this morning to discuss d/c plans and if they had talked about SNF possibility.  Both quickly report that their plan is "absolutely" for pt to d/c home and daughter to provide all needed assistance.  They both feel that she is making good gains and that daughter can manage care at home.  Vantasia Pinkney, LCSW

## 2015-11-23 NOTE — Progress Notes (Signed)
Occupational Therapy Note  Patient Details  Name: Cynthia [name] MRN: [id number] Date of Birth: [birth year]  Today's Date: 11/23/2015 OT Individual Time: 1345-1430 OT Individual Time Calculation (min): 45 min   Pt c/o ongoing tingling in BUE/BLE; RN aware, emotional support provided Individual Therapy  Pt resting in bed upon arrival.  Pt just returned to bed before current therapy session.  Pt engaged in BUE therex with 4# weight bar.  Focus on continued BUE strength to increase independence with BADLs and functional transfers.   [name] [facility] 11/23/2015, 2:56 PM

## 2015-11-23 NOTE — Progress Notes (Signed)
Physical Therapy Session Note  Patient Details  Name: Cynthia [name] MRN: [id number] Date of Birth: 09-19-1935  Today's Date: 11/23/2015 PT Individual Time: 1305-1330 PT Individual Time Calculation (min): 25 min   Short Term Goals: Week 4:  PT Short Term Goal 1 (Week 4): = LTGs   Skilled Therapeutic Interventions/Progress Updates:    Pt received seated in w/c with daughter present; denies pain and agreeable to treatment but states "we better just do some stretches if we only have 30 minutes". Agreeable to perform LE strengthening exercises. Per pt and daughter, they have handouts for LE stretching but none for strengthening. Pt given handouts with seated exercises including hip flexion marching, long arc quad, hip adduction isometric and ankle pumps; all performed 2 sets 10 reps with min cues for technique. Lateral scoot to return to bed with pt directing care and explaining setup of transfer board, mod cues for w/c setup with pt initially setting up w/c at foot of bed. Transfer performed min guard/minA with transfer board to bed. Sit >supine modA for BLE management. Pt remained seated in bed with handoff to rec therapy with therapy dog; all needs within reach at completion of session.   Therapy Documentation Precautions:  Precautions Precautions: Fall Precaution Comments: quadriparesis, move BLE very carefully due to pain Restrictions Weight Bearing Restrictions: No   See Function Navigator for Current Functional Status.   Therapy/Group: Individual Therapy  Luberta Mutter 11/23/2015, 2:24 PM

## 2015-11-24 ENCOUNTER — Inpatient Hospital Stay (HOSPITAL_COMMUNITY): Payer: Medicare Other | Admitting: Physical Therapy

## 2015-11-24 ENCOUNTER — Inpatient Hospital Stay (HOSPITAL_COMMUNITY): Payer: Medicare Other | Admitting: *Deleted

## 2015-11-24 ENCOUNTER — Inpatient Hospital Stay (HOSPITAL_COMMUNITY): Payer: Medicare Other

## 2015-11-24 NOTE — Progress Notes (Signed)
Recreational Therapy Session Note  Patient Details  Name: Cynthia [name] MRN: [id number] Date of Birth: [birth year] Today's Date: 11/24/2015  Pain: no c/o Skilled Therapeutic Interventions/Progress Updates: Session focused on activity tolerance & dynamic standing balance using Stedy.  Pt performed sit-stands with min assist faded to supervision using Stedy to pull up.  Once standing, pt required min assist for balance during ball tap activity.  Therapy/Group: Co-Treatment   Westlyn Glaza 11/24/2015, 4:09 PM

## 2015-11-24 NOTE — Patient Care Conference (Signed)
Inpatient RehabilitationTeam Conference and Plan of Care Update Date: 11/24/2015   Time: 2:30  PM    Patient Name: Cynthia [name]      Medical Record Number: [id number]  Date of Birth: 1936-06-25 Sex: Female         Room/Bed: 4W20C/4W20C-01 Payor Info: Payor: [employer] MEDICARE / Plan: [facility] MEDICARE / Product Type: *No Product type* /    Admitting Diagnosis: GBS  Admit Date/Time:  10/27/2015  3:35 PM Admission Comments: No comment available   Primary Diagnosis:  Quadriplegia and quadriparesis ([place]) Principal Problem: Quadriplegia and quadriparesis ([place])  Patient Active Problem List   Diagnosis Date Noted  . Poor appetite   . Neurogenic bladder   . Adjustment disorder with mixed anxiety and depressed mood   . Quadriplegia and quadriparesis ([place])   . Numbness and tingling   . Paresthesias   . UTI (lower urinary tract infection)   . History of colon cancer   . Acute blood loss anemia   . Upper GI bleed   . GBS (Guillain-Barre syndrome) ([place])   . Aspiration into airway   . Endotracheally intubated   . Acute respiratory failure ([place])   . Shock ([place]) 10/09/2015  . Hemoptysis   . Transverse myelitis ([place])   . Peripheral neuropathy ([place]) 10/06/2015  . Paresthesia of both hands 10/06/2015  . Paresthesia of both feet 10/06/2015  . Cerebral embolism with cerebral infarction 10/06/2015  . Ataxia   . Colon carcinoma metastatic to liver ([place]) 09/25/2015  . Metastasis to liver ([place]) 08/28/2015  . Colon cancer metastasized to liver ([place])   . Liver lesion   . Cerebral infarction due to unspecified mechanism 01/29/2015  . B12 deficiency 01/29/2015  . Essential hypertension 01/29/2015  . Arm numbness left   . Disorientation   . Numbness and tingling of left side of face   . TIA (transient ischemic attack) 01/11/2015  . Confusion   . Left arm numbness   . DVT, lower extremity, distal ([place]) 10/27/2013  . T4a, N0 09/27/2013  . Iron deficiency anemia 09/04/2013  .  Symptomatic anemia 09/03/2013  . Orthostasis 09/03/2013  . Near syncope 09/03/2013    Expected Discharge Date: Expected Discharge Date: 12/01/15  Team Members Present: Physician leading conference: Dr. Alger Simons Social Worker Present: Lennart Pall, LCSW Nurse Present: Dorien Chihuahua, RN PT Present: Carney Living, PT OT Present: Willeen Cass, OT;Roanna Epley, COTA SLP Present: Weston Anna, SLP PPS Coordinator present : Daiva Nakayama, RN, CRRN     Current Status/Progress Goal Weekly Team Focus  Medical   slowly improving from neuro standpoint. emptying bladder. pain getting better  continued strengthening of core, better pain mgt  pain, bladder, nutrition   Bowel/Bladder   patient is incontinent of Bowel/Bladder, LBM: 11/23/2015; required timed toileting.  Patient to be continent of Bowel/Bladder with min assist  do timed toileting Q 6-8hrs and monitor for bowel program   Swallow/Nutrition/ Hydration             ADL's   UB bathing/dressing-supervison; functional transfers-min A; LB bathing dressing-max A; static sitting balance-supervison  mod A overall except UB dressing at supervision-BSC transfers to be upgraded to min A  activity tolerance, bed mobility, sitting balance, functional transfers, BADL retraining, family education   Mobility   min-mod A bed mobility, supervision sitting balance and wheelchair propulsion, S-steady A level and mod A incline slide board transfer  supervision-mod A  transfers, sitting balance, standing with equipment, BLE NMR, activity tolerance, pt/family education  Communication             Safety/Cognition/ Behavioral Observations            Pain   Complains of tingling in both upper and lower extremities and generalized pain but refuses any pain medications saying nothing works.  <3  Asses and treat pain Qshift and as needed   Skin   MASD to buttocks, cream in use  Patient skin to remain free of infection and beakdown  Assess skin Qshift  and as needed, reposition Q2hrs    Rehab Goals Patient on target to meet rehab goals: Yes *See Care Plan and progress notes for long and short-term goals.  Barriers to Discharge: see prior     Possible Resolutions to Barriers:  family ed, training, moved discharge back a week    Discharge Planning/Teaching Needs:  Plan home with daughter as primary caregiver and able to provide 24/7 assistance along with help from daughter-in-law  Teaching has been ongoing   Team Discussion:  Making excellent progress this week and on track for d/c next week.  Car transfer done today and to repeat with daughter.  Can be supervision/ steady assist with level transfers.  SW reports pt and daughter no longer considering SNF option.    Revisions to Treatment Plan:  None   Continued Need for Acute Rehabilitation Level of Care: The patient requires daily medical management by a physician with specialized training in physical medicine and rehabilitation for the following conditions: Daily direction of a multidisciplinary physical rehabilitation program to ensure safe treatment while eliciting the highest outcome that is of practical value to the patient.: Yes Daily medical management of patient stability for increased activity during participation in an intensive rehabilitation regime.: Yes Daily analysis of laboratory values and/or radiology reports with any subsequent need for medication adjustment of medical intervention for : Neurological problems;Urological problems  [name] 11/24/2015, 3:56 PM

## 2015-11-24 NOTE — Progress Notes (Signed)
Social Work Patient ID: Cynthia [name], female   DOB: 05-24-1936, 80 y.o.   MRN: [id number]   Met with pt and daughter to review team conference today.  Both very pleased with progress and feeling much better about d/c home next week.  Pt optimistic about continued recovery and happy that her b/b function is returning.  Daughter going through tx sessions for education.  Discussed DME needs and follow up.  Planning for 4/18 d/c.  Cynthia Rautio, LCSW

## 2015-11-24 NOTE — Progress Notes (Signed)
Occupational Therapy Session Note  Patient Details  Name: Cynthia [name] MRN: [id number] Date of Birth: 04-12-1936  Today's Date: 11/24/2015 OT Individual Time: [id number] OT Individual Time Calculation (min): 75 min    Short Term Goals: Week 3:  OT Short Term Goal 1 (Week 3): Pt will sit EOB with mod A for 10 mins in preparation for performing UB dressing tasks OT Short Term Goal 2 (Week 3): Pt will perform drop arm BSC SBT transfer with max A OT Short Term Goal 3 (Week 3): Pt will perform LB bathing tasks with mod A at bed level OT Short Term Goal 4 (Week 3): Pt will direct care when requiring assistance for positioning and bed mobility with min verbal cues  Skilled Therapeutic Interventions/Progress Updates:    Pt resting in bed upon arrival and "ready for therapy." Pt declined bathing and dressing this morning stating that she didn't receive a night bath and her clothes were clean.  Pt performed supine>sit EOB and lateral lean to prepare for sliding board transfer to w/c. Pt propelled w/c to sink and completed all grooming tasks without assistance. Pt propelled to therapy gym and transferred to mat to engaged in dynamic sitting tasks.  Pt c/o increased "stiffness" in lumbar and lower thoracic region of back when leaning forward and towards feet.  Pt transitioned to [facility] tasks seated unsupported on mat.  Pt transferred back to w/c and propelled to room to await her next therapy session.  All needs within reach. Focus on functional transfers, dynamic sitting balance, increased use of BUE for functional tasks, discharge planning, and safety awareness.  Therapy Documentation Precautions:  Precautions Precautions: Fall Precaution Comments: quadriparesis, move BLE very carefully due to pain Restrictions Weight Bearing Restrictions: No Pain:   pt c/o ongoing tingling in BUE/BLE, increasing with activity; RN aware  See Function Navigator for Current Functional Status.   Therapy/Group:  Individual Therapy  Leroy Libman 11/24/2015, 9:16 AM

## 2015-11-24 NOTE — Progress Notes (Signed)
Subjective/Complaints: No new issues. Feeling well. Happy about functional progress--transferring better.   ROS: Denies CP, SOB, N/V/D   Objective: Vital Signs: Blood pressure 103/66, pulse 76, temperature 98.6 F (37 C), temperature source Oral, resp. rate 17, height 5\' 2"  (1.575 m), weight 55.6 kg (122 lb 9.2 oz), SpO2 97 %. No results found. No results found for this or any previous visit (from the past 72 hour(s)).   HEENT: Normocephalic. Atraumatic. Cardio: RRR Resp: CTA B/L GI: BS positive and NT, ND Skin:   Intact. Warm and dry. Neuro: Alert/Oriented Motor 4/5 B/l UE now---improved grip as well, [place] coordination B/l LE 2+/5 hip flexion, knee extension, wiggle bilateral toes.  Musc/Skel:  No tenderness. No edema Gen: NAD. Vital signs reviewed. Psych: Mood, affect and behavior appropriate  Assessment/Plan: 1. 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 Physiatrist is providing close team supervision and 24 hour management of active medical problems listed below. Physiatrist and rehab team continue to assess barriers to discharge/monitor patient progress toward functional and medical goals. FIM: Function - Bathing Bathing activity did not occur: N/A Position: Wheelchair/chair at sink Body parts bathed by patient: Right arm, Left arm, Chest, Abdomen Body parts bathed by helper: Front perineal area, Buttocks, Right upper leg, Left upper leg, Right lower leg, Left lower leg Bathing not applicable: Front perineal area, Buttocks, Right upper leg, Left upper leg, Right lower leg, Left lower leg Assist Level: 2 helpers (per nursing)  Function- Upper Body Dressing/Undressing Upper body dressing/undressing activity did not occur: Refused What is the patient wearing?: Pull over shirt/dress Pull over shirt/dress - Perfomed by patient: Thread/unthread right sleeve,  Thread/unthread left sleeve, Put head through opening, Pull shirt over trunk Pull over shirt/dress - Perfomed by helper: Pull shirt over trunk, Thread/unthread right sleeve, Thread/unthread left sleeve Assist Level: Set up Set up : To obtain clothing/put away Function - Lower Body Dressing/Undressing What is the patient wearing?: Underwear, Pants, Ted Hose, Non-skid slipper socks Position: Bed Underwear - Performed by helper: Thread/unthread right underwear leg, Thread/unthread left underwear leg, Pull underwear up/down Pants- Performed by helper: Thread/unthread right pants leg, Thread/unthread left pants leg, Pull pants up/down Non-skid slipper socks- Performed by helper: Don/doff right sock, Don/doff left sock Shoes - Performed by helper: Don/doff right shoe, Don/doff left shoe, Fasten right, Fasten left AFO - Performed by helper: Don/doff right AFO, Don/doff left AFO TED Hose - Performed by helper: Don/doff right TED hose, Don/doff left TED hose Assist for footwear: Dependant Assist for lower body dressing: 2 Helpers  Function - Toileting Toileting activity did not occur: No continent bowel/bladder event Toileting steps completed by patient: Adjust clothing prior to toileting, Performs perineal hygiene, Adjust clothing after toileting Toileting steps completed by helper: Performs perineal hygiene (only had a brief, no pants) Assist level: Touching or steadying assistance (Pt.75%)  Function - Air cabin crew transfer activity did not occur: Safety/medical concerns Toilet transfer assistive device: Drop arm commode, Sliding board Assist level to toilet: Moderate assist (Pt 50 - 74%/lift or lower) Assist level from toilet: Moderate assist (Pt 50 - 74%/lift or lower)  Function - Chair/bed transfer Chair/bed transfer activity did not occur: Safety/medical concerns (unable to tolerate sitting upright EOB ) Chair/bed transfer method: Lateral scoot Chair/bed transfer assist level:  Touching or steadying assistance (Pt > 75%) Chair/bed transfer assistive device: Sliding board Mechanical lift: Stedy Chair/bed transfer details: Verbal cues for safe use  of DME/AE  Function - Locomotion: Wheelchair Will patient use wheelchair at discharge?: Yes Type: Manual Wheelchair activity did not occur: Safety/medical concerns Max wheelchair distance: 200 Assist Level: Supervision or verbal cues Assist Level: Supervision or verbal cues Assist Level: Supervision or verbal cues Turns around,maneuvers to table,bed, and toilet,negotiates 3% grade,maneuvers on rugs and over doorsills: No Function - Locomotion: Ambulation Ambulation activity did not occur: Safety/medical concerns  Function - Comprehension Comprehension: Auditory Comprehension assist level: Understands basic 90% of the time/cues < 10% of the time  Function - Expression Expression: Verbal Expression assist level: Expresses basic needs/ideas: With no assist  Function - Social Interaction Social Interaction assist level: Interacts appropriately 90% of the time - Needs monitoring or encouragement for participation or interaction.  Function - Problem Solving Problem solving assist level: Solves complex 90% of the time/cues < 10% of the time  Function - Memory Memory assist level: Recognizes or recalls 90% of the time/requires cueing < 10% of the time Patient normally able to recall (first 3 days only): Current season  Medical Problem List and Plan: 1.  Quadriplegia, numbness, gait instability secondary to GBS  -continue CIR,    -LOS extended to help better meet goals/level where daughter can manage her--pt ok with it 2.  DVT Prophylaxis/Anticoagulation: Pharmaceutical: Lovenox  -dopplers with LLE peroneal/gastroc dvt--follow up doppler with clearing but new left soleal thrombus---continue current lovenox dosing---consider re-check prior to discharge, asymptomatic  3. Pain Management: Tylenol prn. Low dose tramadol  seems to have helped  -gabapentin Increased to 400mg  tid with good results 4. Mood: LCSW to follow for evaluation and support.   5. Neuropsych: This patient is not fully capable of making decisions on her own behalf. 6. Skin/Wound Care: Routine pressure relief measures.   7. Fluids/Electrolytes/Nutrition: Monitor I/O. Poor appetite  improving, eating 100% of her diet yesterday.  -BMET normal on 4/6    -encourage PO 8.  Neurogenic bladder/Citrobacter Koseri UTI:   -continent  -Urecholine- continue at 25mg  QID--try to taper next week?  -bactrim completed  -flomax     -timed voids. Continue up to commode/toilet to empty 9. Colon cancer with liver mets: No complaints of pain.    10. UGIB with ABLA: H/H stable and anemia resolving. Continue to monitor for any recurrent signs of bleeding.   -encourage fluids and ongoing acclimation.  -hgb 11.0 on 4/6  -recheck tomorrow    LOS (Days) 28 A FACE TO FACE EVALUATION WAS PERFORMED  Dhani Imel T 11/24/2015, 9:01 AM

## 2015-11-24 NOTE — Progress Notes (Signed)
Physical Therapy Session Note  Patient Details  Name: Cynthia [name] MRN: [id number] Date of Birth: 18-Jun-1936  Today's Date: 11/24/2015 PT Individual Time: 1000-1053 and 1135-1430 PT Individual Time Calculation (min): 53 min and 55 min  Short Term Goals: Week 4:  PT Short Term Goal 1 (Week 4): = LTGs   Skilled Therapeutic Interventions/Progress Updates:   Treatment 1: Co-treatment with RT for first half of session with focus on sit <> stand transfers via [place] with min A > supervision and BLE weightbearing and standing balance while engaging in functional task with alternating UE support with min A for midline orientation and maintaining COG over BOS. Patient performed slide board transfers x 2 with min cues to recall removing foot rests and min A with assist to place/stabilize slide board on slight incline, mod verbal cues for head hips relationship and hand placement. Patient able to manage R arm rest and remove slide board with supervision. Performed bed mobility on mat table with cues for sequencing and mod A to bring BLE on table for sit > supine and min A to initiate elevating trunk with encouragement for supine > sit and mod verbal cues for technique as patient demonstrated decreased mental flexibility performing bed mobility without rail. Patient propelled wheelchair on level surface 2 x 200 ft with mod I. Performed supine BLE NMR with cues for technique and eccentric control: glute sets x 20, ankle pumps x 20, SAQ over bolster 2 x 10 each LE, heel slides x 5 each LE. Patient left in wheelchair to return to room with family.   Treatment 2: Patient propelled wheelchair using BUE throughout hospital, on/off elevators, over thresholds, on inclines/declines, and uneven surfaces and back to room with close supervision and min verbal cues for sequencing and problem solving. Patient performed slide board transfers x 2 to actual car [employer]) with daughter present to observe and PT  providing mod A and assist for stabilizing slide board x 1 with shorter 24" slide board and then retrieved longer 30" slide board for second transfer with improved ease noted due to increased safety with longer slide board. Discussed using long slide board for all transfers to minimize DME required, patient and daughter verbalized understanding. Patient's daughter assisted patient with uphill slide board transfer wheelchair > bed at end of session with cues for safe setup from PT and mod A, recommend more hands-on training before daughter cleared to assist patient in room and both verbalized understanding. Daughter assisted patient from sit > supine bringing BLE up on bed and required cues from PT to reposition in bed. Patient left supine in bed with needs in reach and daughter present.   Therapy Documentation Precautions:  Precautions Precautions: Fall Precaution Comments: quadriparesis, move BLE very carefully due to pain Restrictions Weight Bearing Restrictions: No Pain: 9/10 tingling pain in BUE/BLE   See Function Navigator for Current Functional Status.   Therapy/Group: Individual Therapy  Laretta Alstrom 11/24/2015, 11:00 AM

## 2015-11-25 ENCOUNTER — Inpatient Hospital Stay (HOSPITAL_COMMUNITY): Payer: Medicare Other

## 2015-11-25 ENCOUNTER — Inpatient Hospital Stay (HOSPITAL_COMMUNITY): Payer: Medicare Other | Admitting: Physical Therapy

## 2015-11-25 LAB — CBC
HCT: 32.7 % — ABNORMAL LOW (ref 36.0–46.0)
HEMOGLOBIN: 10.9 g/dL — AB (ref 12.0–15.0)
MCH: 30.2 pg (ref 26.0–34.0)
MCHC: 33.3 g/dL (ref 30.0–36.0)
MCV: 90.6 fL (ref 78.0–100.0)
Platelets: 243 10*3/uL (ref 150–400)
RBC: 3.61 MIL/uL — ABNORMAL LOW (ref 3.87–5.11)
RDW: 16.1 % — AB (ref 11.5–15.5)
WBC: 4.2 10*3/uL (ref 4.0–10.5)

## 2015-11-25 LAB — BASIC METABOLIC PANEL
Anion gap: 9 (ref 5–15)
BUN: 9 mg/dL (ref 4–21)
BUN: 9 mg/dL (ref 6–20)
CALCIUM: 9 mg/dL (ref 8.9–10.3)
CHLORIDE: 108 mmol/L (ref 101–111)
CO2: 23 mmol/L (ref 22–32)
CREATININE: 0.52 mg/dL (ref 0.44–1.00)
Creatinine: 0.5 mg/dL (ref 0.5–1.1)
GFR calc non Af Amer: >60 mL/min (ref >60)
GLUCOSE: 90 mg/dL
Glucose, Bld: 90 mg/dL (ref 65–99)
Potassium: 4.3 mmol/L (ref 3.5–5.1)
SODIUM: 140 mmol/L (ref 135–145)
Sodium: 140 mmol/L (ref 137–147)

## 2015-11-25 MED ORDER — BOOST PLUS PO LIQD
237.0000 mL | Freq: Three times a day (TID) | ORAL | Status: DC | PRN
  Filled 2015-11-25: qty 237

## 2015-11-25 NOTE — Progress Notes (Signed)
Physical Therapy Weekly Progress Note  Patient Details  Name: GUDELIA EUGENE MRN: [id number] Date of Birth: [birth year]  Beginning of progress report period: November 18, 2015 End of progress report period: November 25, 2015  Today's Date: 11/25/2015 PT Individual Time: 1105-1200 and 1500-1530 PT Individual Time Calculation (min): 55 min and 30 min  Patient has met 4 of 8 long term goals. Patient continues to make functional progress with wheelchair mobility and parts management, transfers, bed mobility, and strength. Patient has progressed to use of Stedy with nursing staff to promote increased BLE WB with transfers with S-min A and is performing slide board transfers with steady assist to mod A. Continue to focus on family training with daughter in preparation for discharge home.   Patient continues to demonstrate the following deficits: quadriparesis, muscle weakness, neuropathic pain, muscle joint tightness, decreased cardiorespiratory endurance, decreased proprioception, impaired sensation, impaired timing and sequencing, abnormal tone, unbalanced muscle activation, decreased coordination, decreased sitting/standing balance, decreased postural control, and decreased balance strategies  and therefore will continue to benefit from skilled PT intervention to enhance overall performance with activity tolerance, balance, postural control, ability to compensate for deficits, functional use of  right upper extremity, right lower extremity, left upper extremity and left lower extremity and coordination.  See Patient's Care Plan for progression toward long term goals.  Patient progressing toward long term goals. Continue plan of care.  Skilled Therapeutic Interventions/Progress Updates:   Treatment 1: Patient in bed with daughter present for training. Patient transferred supine > sit using rail with supervision. Daughter assisted patient with slide board transfers x 4 with min verbal cues for setup and  foot placement with steady assist for downhill transfers and min-mod A for uphill transfers. Patient continues to require verbal/visual cues for sequencing and technique especially hand placement and head hips relationship. Trialled 4" step for BLE to assist with WB during uphill transfers. Performed sit <> stand from mat table using RW with max A x 3, max multimodal cues for sequencing and technique with patient requiring max-total A standing balance to prevent LOB due to locking out knees and knee buckling when cues to tuck hips under trunk and max cues for forward gaze, mirror in front for visual feedback. Patient required prolonged rest breaks between trials due to fatigue and shortness of breath. Patient instructed in seated BLE therex: LAQ x 10 each LE, alt hip flexion x 15, ankle pumps, hip adduction pillow squeeze with 3 sec hold x 10. Patient transferred back to wheelchair on level surface via slide board with daughter stabilizing/placing board with supervision. Patient donned and doffed R and L leg rest with supervision and cues for technique. Patient left in wheelchair to return to room with daughter.     Treatment 2: Patient in wheelchair, propelled to gym using BUE with mod I. Performed squat pivot transfer with max A wheelchair <> NuStep. Performed NuStep using BUE/BLE at level 6 x 10 min and BLE only at level 1-2 x 3 min for neuro re-ed and activity tolerance. Patient performed uphill slide board transfer back to bed with min A and sit > supine with mod A to lift BLE in bed. Patient able to bridge with PT stabilizing BLE in hooklying to reposition in bed. Patient left semi reclined with all needs in reach.   Therapy Documentation Precautions:  Precautions Precautions: Fall Precaution Comments: quadriparesis, move BLE very carefully due to pain Restrictions Weight Bearing Restrictions: No Pain: Pain Assessment Pain Assessment: No/denies pain Pain Score:  0-No pain   See Function  Navigator for Current Functional Status.  Therapy/Group: Individual Therapy  Laretta Alstrom 11/25/2015, 12:12 PM

## 2015-11-25 NOTE — Progress Notes (Signed)
Occupational Therapy Session Note  Patient Details  Name: Cynthia [name] MRN: [id number] Date of Birth: 02/04/1936  Today's Date: 11/25/2015 OT Individual Time: [id number] OT Individual Time Calculation (min): 75 min    Short Term Goals: Week 3:  OT Short Term Goal 1 (Week 3): Pt will sit EOB with mod A for 10 mins in preparation for performing UB dressing tasks OT Short Term Goal 2 (Week 3): Pt will perform drop arm BSC SBT transfer with max A OT Short Term Goal 3 (Week 3): Pt will perform LB bathing tasks with mod A at bed level OT Short Term Goal 4 (Week 3): Pt will direct care when requiring assistance for positioning and bed mobility with min verbal cues  Skilled Therapeutic Interventions/Progress Updates:    Pt resting in bed upon arrival.  Pt initially engaged in LB dressing tasks at bed level and sitting EOB.  Pt unable to thread pants over feet but she was able to pull pants over hips while rolling side-to-side in bed.  Pt sat EOB with supervision and attempted to use sock aide to assist with donning socks over Ted hose.  Pt was unsuccessful but alternative methods will be investigated.  Pt performed SBT to w/c with supervision and completed all grooming tasks at sink before propelling to therapy gym.  Pt transferred to mat and practiced sit>supine on mat.  Pt continues to require assistance bringing BLE onto mat, although she continues to demonstrate progress with this task.  Pt transferred back to w/c and propelled back to her room to await her next therapy.  Pt remained in w/c with all needs within reach.   Therapy Documentation Precautions:  Precautions Precautions: Fall Precaution Comments: quadriparesis, move BLE very carefully due to pain Restrictions Weight Bearing Restrictions: No   Pain:  Pt c/o ongoing "tingling" in BUE/BLE with increasing intensity with activity; emotional support  See Function Navigator for Current Functional Status.   Therapy/Group:  Individual Therapy  Leroy Libman 11/25/2015, 9:41 AM

## 2015-11-25 NOTE — Progress Notes (Signed)
Nutrition Follow-up  DOCUMENTATION CODES:   Severe malnutrition in context of acute illness/injury  INTERVENTION:  Provide Boost Plus po TID PRN if po intake is <50% at meals, each supplement provides 360 kcal and 14 grams of protein.  Encourage adequate PO intake.   NUTRITION DIAGNOSIS:   Malnutrition related to acute illness as evidenced by percent weight loss, moderate depletions of muscle mass; ongoing  GOAL:   Patient will meet greater than or equal to 90% of their needs; met  MONITOR:   PO intake, Supplement acceptance, Weight trends, Labs, I & O's, Skin  REASON FOR ASSESSMENT:   Low Braden    ASSESSMENT:   80 y.o. female with metastatic colon cancer s/p surgery and chemotherapy, recurrent C-diff, HTN, stroke with lleft sided weakness/numbness, DVT who was admitted on 10/06/15 with complaints of dizziness, paraesthesias of right arm and right leg, numbness of hands and feet, BLE weakness and difficulty with walking. She was intubated 02/26- 03/05 due to respiratory insufficiency and was treated for HCAP with Vanc/Zosyn. MRI Cervical/thoracic/lumbar spine showed severe neural foraminal stenosis R-C4, R-C5, B-C6 and L-C7 nerve roots and peripheral enhancement of distal thoracic spinal cord is well nerve roots with anterior and posterior suggestive of GBS  Pt reports having a good appetite. Meal completion has been 60-100%, with most po 80-100%. Pt currently has Boost Plus ordered and has been refusing them recently. RD to modify orders to provide Boost Plus PRN when po is <50% at meals. Labs and medications reviewed.   Diet Order:  DIET DYS 3 Room service appropriate?: Yes; Fluid consistency:: Thin  Skin:  Wound (see comment) (Stage I pressure ulcer on coccyx)  Last BM:  4/11  Height:   Ht Readings from Last 1 Encounters:  10/27/15 _0  (1.575 m)    Weight:   Wt Readings from Last 1 Encounters:  11/25/15 124 lb 1.9 oz (56.3 kg)    Ideal Body Weight:  50  kg  BMI:  Body mass index is 22.7 kg/(m^2).  Estimated Nutritional Needs:   Kcal:  1600-1850  Protein:  80-90 grams  Fluid:  1.6 - 1.8 L/day  EDUCATION NEEDS:   No education needs identified at this time  Corrin Parker, MS, RD, LDN Pager # [phone] After hours/ weekend pager # [phone]

## 2015-11-25 NOTE — Progress Notes (Signed)
Occupational Therapy Note  Patient Details  Name: Cynthia [name] MRN: [id number] Date of Birth: 09/05/1935  Today's Date: 11/25/2015 OT Individual Time: 1345-1430 OT Individual Time Calculation (min): 45 min   Pt c/o ongoing tingling in BUE/BLE; emotional support provided Individual therapy  Pt resting in w/c upon arrival with daughter present.  Pt requested to use toilet (BSC over toilet). Pt was able to perform hygiene tasks today but required assistance with clothing management.  Discussed toileting scheudle with pt and daughter.  Recommended transfers to [facility] from bed when at home until such time as pt is able to perform squat pivot transfers.  Discussed bathing schedule and recommendations.  Continued discharge planning.    [name] [facility] 11/25/2015, 2:33 PM

## 2015-11-25 NOTE — Progress Notes (Signed)
Subjective/Complaints: Pleased with progress. Able to stand/transfer better. Pain much improved.    ROS: Denies CP, SOB, N/V/D   Objective: Vital Signs: Blood pressure 102/60, pulse 76, temperature 98.6 F (37 C), temperature source Oral, resp. rate 17, height 5' 2"  (1.575 m), weight 56.3 kg (124 lb 1.9 oz), SpO2 97 %. No results found. Results for orders placed or performed during the hospital encounter of 10/27/15 (from the past 72 hour(s))  CBC     Status: Abnormal   Collection Time: 11/25/15  7:42 AM  Result Value Ref Range   WBC 4.2 4.0 - 10.5 K/uL   RBC 3.61 (L) 3.87 - 5.11 MIL/uL   Hemoglobin 10.9 (L) 12.0 - 15.0 g/dL   HCT 32.7 (L) 36.0 - 46.0 %   MCV 90.6 78.0 - 100.0 fL   MCH 30.2 26.0 - 34.0 pg   MCHC 33.3 30.0 - 36.0 g/dL   RDW 16.1 (H) 11.5 - 15.5 %   Platelets 243 150 - 400 K/uL  Basic metabolic panel     Status: None   Collection Time: 11/25/15  7:42 AM  Result Value Ref Range   Sodium 140 135 - 145 mmol/L   Potassium 4.3 3.5 - 5.1 mmol/L   Chloride 108 101 - 111 mmol/L   CO2 23 22 - 32 mmol/L   Glucose, Bld 90 65 - 99 mg/dL   BUN 9 6 - 20 mg/dL   Creatinine, Ser 0.52 0.44 - 1.00 mg/dL   Calcium 9.0 8.9 - 10.3 mg/dL   GFR calc non Af Amer >60 >60 mL/min   GFR calc Af Amer >60 >60 mL/min    Comment: (NOTE) The eGFR has been calculated using the CKD EPI equation. This calculation has not been validated in all clinical situations. eGFR's persistently <60 mL/min signify possible Chronic Kidney Disease.    Anion gap 9 5 - 15     HEENT: Normocephalic. Atraumatic. Cardio: RRR Resp: CTA B/L GI: BS positive and NT, ND Skin:   Intact. Warm and dry. Neuro: Alert/Oriented Motor 4/5 B/l UE now---improved grip as well, [place] coordination B/l LE 2+/5 hip flexion, knee extension, wiggle bilateral toes.  Musc/Skel:  No tenderness. No edema Gen: NAD. Vital signs reviewed. Psych: Mood, affect and behavior appropriate  Assessment/Plan: 1. 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 Physiatrist is providing close team supervision and 24 hour management of active medical problems listed below. Physiatrist and rehab team continue to assess barriers to discharge/monitor patient progress toward functional and medical goals. FIM: Function - Bathing Bathing activity did not occur: N/A Position: Wheelchair/chair at sink Body parts bathed by patient: Right arm, Left arm, Chest, Abdomen Body parts bathed by helper: Front perineal area, Buttocks, Right upper leg, Left upper leg, Right lower leg, Left lower leg Bathing not applicable: Front perineal area, Buttocks, Right upper leg, Left upper leg, Right lower leg, Left lower leg Assist Level: 2 helpers (per nursing)  Function- Upper Body Dressing/Undressing Upper body dressing/undressing activity did not occur: Refused What is the patient wearing?: Pull over shirt/dress Pull over shirt/dress - Perfomed by patient: Thread/unthread right sleeve, Thread/unthread left sleeve, Put head through opening, Pull shirt over trunk Pull over shirt/dress - Perfomed by helper: Pull shirt over trunk, Thread/unthread right sleeve, Thread/unthread left sleeve Assist Level: Set up Set up : To obtain clothing/put away Function - Lower Body Dressing/Undressing What is the patient wearing?:  Underwear, Pants, [employer], Non-skid slipper socks Position: Bed Underwear - Performed by helper: Thread/unthread right underwear leg, Thread/unthread left underwear leg, Pull underwear up/down Pants- Performed by helper: Thread/unthread right pants leg, Thread/unthread left pants leg, Pull pants up/down Non-skid slipper socks- Performed by helper: Don/doff right sock, Don/doff left sock Shoes - Performed by helper: Don/doff right shoe, Don/doff left shoe, Fasten right, Fasten left AFO - Performed by helper: Don/doff right  AFO, Don/doff left AFO TED Hose - Performed by helper: Don/doff right TED hose, Don/doff left TED hose Assist for footwear: Dependant Assist for lower body dressing: 2 Helpers  Function - Toileting Toileting activity did not occur: No continent bowel/bladder event Toileting steps completed by patient: Adjust clothing prior to toileting, Performs perineal hygiene, Adjust clothing after toileting Toileting steps completed by helper: Performs perineal hygiene (only had a brief, no pants) Assist level: Touching or steadying assistance (Pt.75%)  Function - Air cabin crew transfer activity did not occur: Safety/medical concerns Toilet transfer assistive device: Mechanical lift, Elevated toilet seat/BSC over toilet Mechanical lift: Stedy Assist level to toilet: Moderate assist (Pt 50 - 74%/lift or lower) Assist level from toilet: Moderate assist (Pt 50 - 74%/lift or lower)  Function - Chair/bed transfer Chair/bed transfer activity did not occur: Safety/medical concerns (unable to tolerate sitting upright EOB ) Chair/bed transfer method: Lateral scoot Chair/bed transfer assist level: Touching or steadying assistance (Pt > 75%) Chair/bed transfer assistive device: Sliding board, Armrests Mechanical lift: Stedy Chair/bed transfer details: Verbal cues for safe use of DME/AE  Function - Locomotion: Wheelchair Will patient use wheelchair at discharge?: Yes Type: Manual Wheelchair activity did not occur: Safety/medical concerns Max wheelchair distance: 200 Assist Level: Supervision or verbal cues Assist Level: Supervision or verbal cues Assist Level: Supervision or verbal cues Turns around,maneuvers to table,bed, and toilet,negotiates 3% grade,maneuvers on rugs and over doorsills: Yes Function - Locomotion: Ambulation Ambulation activity did not occur: Safety/medical concerns  Function - Comprehension Comprehension: Auditory Comprehension assist level: Understands complex 90% of  the time/cues 10% of the time  Function - Expression Expression: Verbal Expression assist level: Expresses basic needs/ideas: With no assist  Function - Social Interaction Social Interaction assist level: Interacts appropriately 90% of the time - Needs monitoring or encouragement for participation or interaction.  Function - Problem Solving Problem solving assist level: Solves complex 90% of the time/cues < 10% of the time  Function - Memory Memory assist level: Recognizes or recalls 90% of the time/requires cueing < 10% of the time Patient normally able to recall (first 3 days only): Current season  Medical Problem List and Plan: 1.  Quadriplegia, numbness, gait instability secondary to GBS  -continue CIR,    -continue to make neurological and functional gains 2.  DVT Prophylaxis/Anticoagulation: Pharmaceutical: Lovenox  -dopplers with LLE peroneal/gastroc dvt--follow up doppler with clearing but new left soleal thrombus---continue current lovenox dosing---consider re-check prior to discharge, asymptomatic  3. Pain Management: Tylenol prn. Low dose tramadol prn  -gabapentin Increased to 479m tid with good results 4. Mood: LCSW to follow for evaluation and support.   5. Neuropsych: This patient is not fully capable of making decisions on her own behalf. 6. Skin/Wound Care: Routine pressure relief measures.   7. Fluids/Electrolytes/Nutrition: Monitor I/O. Poor appetite  improving, eating 100% of her diet yesterday.  -BMET normal on 4/6    -encourage PO 8.  Neurogenic bladder/Citrobacter Koseri UTI:   -continent  -Urecholine- continue at 255mQID--begin tapering tomorrow  -bactrim completed  -flomax     -  Continue up to commode/toilet to empty 9. Colon cancer with liver mets: No complaints of pain.    10. UGIB with ABLA: H/H stable and anemia resolving. Continue to monitor for any recurrent signs of bleeding.   -encourage fluids and ongoing acclimation.  -hgb 10.9 on 4/12       LOS (Days) 29 A FACE TO FACE EVALUATION WAS PERFORMED  SWARTZ,Cynthia [name] 11/25/2015, 8:57 AM

## 2015-11-26 ENCOUNTER — Inpatient Hospital Stay (HOSPITAL_COMMUNITY): Payer: Medicare Other

## 2015-11-26 ENCOUNTER — Inpatient Hospital Stay (HOSPITAL_COMMUNITY): Payer: Medicare Other | Admitting: Physical Therapy

## 2015-11-26 ENCOUNTER — Inpatient Hospital Stay (HOSPITAL_COMMUNITY): Payer: Self-pay | Admitting: *Deleted

## 2015-11-26 ENCOUNTER — Encounter (HOSPITAL_COMMUNITY): Payer: Self-pay

## 2015-11-26 MED ORDER — BETHANECHOL CHLORIDE 10 MG PO TABS
10.0000 mg | ORAL_TABLET | Freq: Four times a day (QID) | ORAL | Status: DC
  Administered 2015-11-26 – 2015-11-30 (×16): 10 mg via ORAL
  Filled 2015-11-26 (×16): qty 1

## 2015-11-26 NOTE — Progress Notes (Signed)
Occupational Therapy Session Note  Patient Details  Name: Cynthia [name] MRN: [id number] Date of Birth: [birth year]  Today's Date: 11/26/2015 OT Individual Time: [id number] OT Individual Time Calculation (min): 75 min    Short Term Goals: Week 3:  OT Short Term Goal 1 (Week 3): Pt will sit EOB with mod A for 10 mins in preparation for performing UB dressing tasks OT Short Term Goal 2 (Week 3): Pt will perform drop arm BSC SBT transfer with max A OT Short Term Goal 3 (Week 3): Pt will perform LB bathing tasks with mod A at bed level OT Short Term Goal 4 (Week 3): Pt will direct care when requiring assistance for positioning and bed mobility with min verbal cues  Skilled Therapeutic Interventions/Progress Updates:    Pt resting in bed upon arrival.  Pt engaged in LB dressing tasks at bed level including rolling side to side to pull up pants over hips.  Pt attempted to cross BLE to facilitate placing pants over feet but unsuccessful at this time. Pt doffed and donned a clean shirt while sitting unsupported at EOB.  Pt transferred bed>w/c with sliding board at supervision level and placed foot rest on w/c prior to propelling to sink to complete grooming tasks. Pt donned gloves and propelled to therapy gym and engaged in BUE therex on [employer] (5 min level 2 forwards and in reverse direction). Focus on bed mobility, sitting balance, functional transfers, w/c mobility, activity tolerance, and BUE strengthening to increase independence with BADLs.   Therapy Documentation Precautions:  Precautions Precautions: Fall Precaution Comments: quadriparesis, move BLE very carefully due to pain Restrictions Weight Bearing Restrictions: No   Pain:  pt c/o ongoing "tingling" in BUE/BLE; emotional support  See Function Navigator for Current Functional Status.   Therapy/Group: Individual Therapy  Leroy Libman 11/26/2015, 9:35 AM

## 2015-11-26 NOTE — Progress Notes (Signed)
Subjective/Complaints: Continues to progress. Legs getting stronger. Pain much better controlled   ROS: Denies CP, SOB, N/V/D   Objective: Vital Signs: Blood pressure 98/62, pulse 70, temperature 98.4 F (36.9 C), temperature source Oral, resp. rate 16, height _0  (1.575 m), weight 54.7 kg (120 lb 9.5 oz), SpO2 98 %. No results found. Results for orders placed or performed during the hospital encounter of 10/27/15 (from the past 72 hour(s))  CBC     Status: Abnormal   Collection Time: 11/25/15  7:42 AM  Result Value Ref Range   WBC 4.2 4.0 - 10.5 K/uL   RBC 3.61 (L) 3.87 - 5.11 MIL/uL   Hemoglobin 10.9 (L) 12.0 - 15.0 g/dL   HCT 32.7 (L) 36.0 - 46.0 %   MCV 90.6 78.0 - 100.0 fL   MCH 30.2 26.0 - 34.0 pg   MCHC 33.3 30.0 - 36.0 g/dL   RDW 16.1 (H) 11.5 - 15.5 %   Platelets 243 150 - 400 K/uL  Basic metabolic panel     Status: None   Collection Time: 11/25/15  7:42 AM  Result Value Ref Range   Sodium 140 135 - 145 mmol/L   Potassium 4.3 3.5 - 5.1 mmol/L   Chloride 108 101 - 111 mmol/L   CO2 23 22 - 32 mmol/L   Glucose, Bld 90 65 - 99 mg/dL   BUN 9 6 - 20 mg/dL   Creatinine, Ser 0.52 0.44 - 1.00 mg/dL   Calcium 9.0 8.9 - 10.3 mg/dL   GFR calc non Af Amer >60 >60 mL/min   GFR calc Af Amer >60 >60 mL/min    Comment: (NOTE) The eGFR has been calculated using the CKD EPI equation. This calculation has not been validated in all clinical situations. eGFR's persistently <60 mL/min signify possible Chronic Kidney Disease.    Anion gap 9 5 - 15     HEENT: Normocephalic. Atraumatic. Cardio: RRR Resp: CTA B/L GI: BS positive and NT, ND Skin:   Intact. Warm and dry. Neuro: Alert/Oriented Motor 4/5 B/l UE now---improved grip as well, [place] coordination B/l LE 2+/5 hip flexion, knee extension, wiggle bilateral toes.  Musc/Skel:  No tenderness. No edema Gen: NAD. Vital signs reviewed. Psych: Mood, affect and behavior appropriate  Assessment/Plan: 1. 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 Physiatrist is providing close team supervision and 24 hour management of active medical problems listed below. Physiatrist and rehab team continue to assess barriers to discharge/monitor patient progress toward functional and medical goals. FIM: Function - Bathing Bathing activity did not occur: N/A Position: Wheelchair/chair at sink Body parts bathed by patient: Right arm, Left arm, Chest, Abdomen Body parts bathed by helper: Front perineal area, Buttocks, Right upper leg, Left upper leg, Right lower leg, Left lower leg Bathing not applicable: Front perineal area, Buttocks, Right upper leg, Left upper leg, Right lower leg, Left lower leg Assist Level: 2 helpers (per nursing)  Function- Upper Body Dressing/Undressing Upper body dressing/undressing activity did not occur: Refused What is the patient wearing?: Pull over shirt/dress Pull over shirt/dress - Perfomed by patient: Thread/unthread right sleeve, Thread/unthread left sleeve, Put head through opening, Pull shirt over trunk Pull over shirt/dress - Perfomed by helper: Pull shirt over trunk, Thread/unthread right sleeve, Thread/unthread left sleeve Assist Level: Set up Set up : To obtain clothing/put away Function - Lower Body Dressing/Undressing What is the patient wearing?: Underwear,  Pants, [employer], Non-skid slipper socks Position: Bed Underwear - Performed by helper: Thread/unthread right underwear leg, Thread/unthread left underwear leg, Pull underwear up/down Pants- Performed by helper: Thread/unthread right pants leg, Thread/unthread left pants leg, Pull pants up/down Non-skid slipper socks- Performed by helper: Don/doff right sock, Don/doff left sock Shoes - Performed by helper: Don/doff right shoe, Don/doff left shoe, Fasten right, Fasten left AFO - Performed by helper: Don/doff right  AFO, Don/doff left AFO TED Hose - Performed by helper: Don/doff right TED hose, Don/doff left TED hose Assist for footwear: Dependant Assist for lower body dressing: 2 Helpers  Function - Toileting Toileting activity did not occur: No continent bowel/bladder event Toileting steps completed by patient: Performs perineal hygiene Toileting steps completed by helper: Adjust clothing prior to toileting, Performs perineal hygiene, Adjust clothing after toileting Toileting Assistive Devices: Toilet aid Assist level: Touching or steadying assistance (Pt.75%)  Function - Air cabin crew transfer activity did not occur: Safety/medical concerns Toilet transfer assistive device: Elevated toilet seat/BSC over toilet, Mechanical lift Mechanical lift: Stedy Assist level to toilet: Touching or steadying assistance (Pt > 75%) Assist level from toilet: Moderate assist (Pt 50 - 74%/lift or lower)  Function - Chair/bed transfer Chair/bed transfer activity did not occur: Safety/medical concerns (unable to tolerate sitting upright EOB ) Chair/bed transfer method: Lateral scoot Chair/bed transfer assist level: Moderate assist (Pt 50 - 74%/lift or lower) Chair/bed transfer assistive device: Sliding board, Armrests Mechanical lift: Stedy Chair/bed transfer details: Verbal cues for safe use of DME/AE  Function - Locomotion: Wheelchair Will patient use wheelchair at discharge?: Yes Type: Manual Wheelchair activity did not occur: Safety/medical concerns Max wheelchair distance: 200 Assist Level: Supervision or verbal cues Assist Level: Supervision or verbal cues Assist Level: Supervision or verbal cues Turns around,maneuvers to table,bed, and toilet,negotiates 3% grade,maneuvers on rugs and over doorsills: Yes Function - Locomotion: Ambulation Ambulation activity did not occur: Safety/medical concerns  Function - Comprehension Comprehension: Auditory Comprehension assist level: Understands  complex 90% of the time/cues 10% of the time  Function - Expression Expression: Verbal Expression assist level: Expresses complex 90% of the time/cues < 10% of the time  Function - Social Interaction Social Interaction assist level: Interacts appropriately 90% of the time - Needs monitoring or encouragement for participation or interaction.  Function - Problem Solving Problem solving assist level: Solves basic 90% of the time/requires cueing < 10% of the time  Function - Memory Memory assist level: Recognizes or recalls 90% of the time/requires cueing < 10% of the time Patient normally able to recall (first 3 days only): Current season  Medical Problem List and Plan: 1.  Quadriplegia, numbness, gait instability secondary to GBS  -continue CIR therapies     2.  DVT Prophylaxis/Anticoagulation: Pharmaceutical: Lovenox  -dopplers with LLE peroneal/gastroc dvt--follow up doppler with clearing but new left soleal thrombus---continue current lovenox dosing---re-check LLE tomorrow  3. Pain Management: Tylenol prn. Low dose tramadol prn  -gabapentin Increased to 414m tid with good results 4. Mood: LCSW to follow for evaluation and support.   5. Neuropsych: This patient is not fully capable of making decisions on her own behalf. 6. Skin/Wound Care: Routine pressure relief measures.   7. Fluids/Electrolytes/Nutrition: Monitor I/O. Poor appetite  improving, eating 100% of her diet yesterday.  -BMET normal on 4/6    -encourage PO 8.  Neurogenic bladder/Citrobacter Koseri UTI:   -continent  -Urecholine- continue at 261mQID--begin tapering today  -bactrim completed  -flomax     - Continue up  to commode/toilet to empty 9. Colon cancer with liver mets: No complaints of pain.    10. UGIB with ABLA: H/H stable and anemia resolving. Continue to monitor for any recurrent signs of bleeding.   -encourage fluids and ongoing acclimation.  -hgb 10.9 on 4/12      LOS (Days) 30 A FACE TO FACE  EVALUATION WAS PERFORMED  SWARTZ,ZACHARY T 11/26/2015, 9:01 AM

## 2015-11-26 NOTE — Progress Notes (Signed)
Physical Therapy Session Note  Patient Details  Name: Cynthia [name] MRN: [id number] Date of Birth: [birth year]  Today's Date: 11/26/2015 PT Individual Time: 1100-1205 and 1415-1500 PT Individual Time Calculation (min): 65 min and 45 min  Short Term Goals: Week 5:  PT Short Term Goal 1 (Week 5): = LTGs due to anticipated LOS  Skilled Therapeutic Interventions/Progress Updates:   Treatment 1: Patient in wheelchair, donned gloves and self propelled to gym with mod I. Patient doffed B leg rests with increased time to recall technique from yesterday. Performed standing frame for BLE NMR and standing postural control x 15 min while engaging in beetle puzzle to challenge upper extremity [place] and coordination. Patient c/o feeling sweaty, standing BP 125/110 and HR 91, reassessed upon sitting BP 110/81. Patient performed slide board transfer wheelchair <> level mat with assist to place board and stabilize with max cues for hand placement and head hips relationship, supervision. Patient worked on Dance movement psychotherapist with manual facilitation for WB through BLE with scooting and patient with improved ability to slightly lift hips and scoot with max cues for technique. Performed seated BLE NMR: LAQ, alt hip flexion, ankle DF/PF, and hip adduction ball squeezes with 3-5 sec hold to fatigue. Patient left sitting in wheelchair setup for lunch meal with needs in reach.     Treatment 2: Patient in bed, daughter present and practiced level slide board transfers with patient with setup of transfer/stabilization of board with supervision. Seated edge of mat, patient practiced lateral scooting to R and L with focus on BLE WB with manual facilitation, hand placement, and head hips relationship. Patient fatigued and required seated rest break. Patient continues to report fear of falling with forward leans during transfers. From raised mat table with arm chair in front of patient, performed forward leans with  patient demonstrating good acceptance of weight through BLE and hip clearance. When PT attempted to assist patient with sitting upright to work on full movement, patient slipped forward off mat requiring assist to recover and reporting increased pain in LLE due to knee flexion stretch. Patient requested to return to bed at end of session, daughter assisted with uphill slide board transfer back to bed with min A and sit > supine with mod A. Ice pack applied to L knee and patient educated on use of ice, patient left supine in bed with daughter and needs in reach.   Therapy Documentation Precautions:  Precautions Precautions: Fall Precaution Comments: quadriparesis, move BLE very carefully due to pain Restrictions Weight Bearing Restrictions: No Vital Signs: Therapy Vitals Pulse Rate: 91 BP: 110/81 mmHg Patient Position (if appropriate): Sitting Oxygen Therapy SpO2: 98 % O2 Device: Not Delivered Pain: Pain Assessment Pain Assessment: No/denies pain   See Function Navigator for Current Functional Status.   Therapy/Group: Individual Therapy  Laretta Alstrom 11/26/2015, 12:18 PM

## 2015-11-26 NOTE — Plan of Care (Signed)
Problem: RH SKIN INTEGRITY Goal: RH STG SKIN FREE OF INFECTION/BREAKDOWN Free of further skin breakdown max assist  Outcome: Progressing Turning throughout night

## 2015-11-27 ENCOUNTER — Inpatient Hospital Stay (HOSPITAL_COMMUNITY): Payer: Medicare Other

## 2015-11-27 ENCOUNTER — Inpatient Hospital Stay (HOSPITAL_COMMUNITY): Payer: Medicare Other | Admitting: Physical Therapy

## 2015-11-27 ENCOUNTER — Ambulatory Visit (HOSPITAL_COMMUNITY): Payer: Medicare Other | Attending: Physical Medicine & Rehabilitation

## 2015-11-27 DIAGNOSIS — R609 Edema, unspecified: Secondary | ICD-10-CM

## 2015-11-27 DIAGNOSIS — I10 Essential (primary) hypertension: Secondary | ICD-10-CM | POA: Insufficient documentation

## 2015-11-27 LAB — PROTIME-INR
INR: 1.22 (ref 0.00–1.49)
PROTHROMBIN TIME: 15.5 s — AB (ref 11.6–15.2)

## 2015-11-27 MED ORDER — WARFARIN SODIUM 5 MG PO TABS: 5.0000 mg | ORAL_TABLET | Freq: Once | ORAL | Status: DC

## 2015-11-27 MED ORDER — WARFARIN - PHARMACIST DOSING INPATIENT: Freq: Every day | Status: DC

## 2015-11-27 MED ORDER — WARFARIN VIDEO: Freq: Once | Status: DC

## 2015-11-27 MED ORDER — COUMADIN BOOK
Freq: Once | Status: AC
  Administered 2015-11-27: 18:00:00
  Filled 2015-11-27: qty 1

## 2015-11-27 NOTE — NC FL2 (Signed)
[place] LEVEL OF CARE SCREENING TOOL     IDENTIFICATION  Patient Name: Cynthia [name] Birthdate: 1936-08-09 Sex: female Admission Date (Current Location): 10/27/2015  [facility] and [place] Number:  Herbalist and Address:  [facility], [place] [address], [place], [place] [address]      Provider Number: [id number]  Attending Physician Name and Address:  Meredith Staggers, MD  Relative Name and Phone Number:       Current Level of Care: Other (Comment) (Acute inpatient rehab unit) Recommended Level of Care: [place] Prior Approval Number:    Date Approved/Denied:   PASRR Number: [id number]  Discharge Plan: SNF    Current Diagnoses: Patient Active Problem List   Diagnosis Date Noted  . Poor appetite   . Neurogenic bladder   . Adjustment disorder with mixed anxiety and depressed mood   . Quadriplegia and quadriparesis ([place])   . Numbness and tingling   . Paresthesias   . UTI (lower urinary tract infection)   . History of colon cancer   . Acute blood loss anemia   . Upper GI bleed   . GBS (Guillain-Barre syndrome) ([place])   . Aspiration into airway   . Endotracheally intubated   . Acute respiratory failure ([place])   . Shock ([place]) 10/09/2015  . Hemoptysis   . Transverse myelitis ([place])   . Peripheral neuropathy ([place]) 10/06/2015  . Paresthesia of both hands 10/06/2015  . Paresthesia of both feet 10/06/2015  . Cerebral embolism with cerebral infarction 10/06/2015  . Ataxia   . Colon carcinoma metastatic to liver ([place]) 09/25/2015  . Metastasis to liver ([place]) 08/28/2015  . Colon cancer metastasized to liver ([place])   . Liver lesion   . Cerebral infarction due to unspecified mechanism 01/29/2015  . B12 deficiency 01/29/2015  . Essential hypertension 01/29/2015  . Arm numbness left   . Disorientation   . Numbness and tingling of left side of face   . TIA (transient ischemic attack) 01/11/2015  . Confusion    . Left arm numbness   . DVT, lower extremity, distal ([place]) 10/27/2013  . T4a, N0 09/27/2013  . Iron deficiency anemia 09/04/2013  . Symptomatic anemia 09/03/2013  . Orthostasis 09/03/2013  . Near syncope 09/03/2013    Orientation RESPIRATION BLADDER Height & Weight     Self, Time, Situation, Place  Normal Incontinent (but improving greatly and control returning for both b/b) Weight: 55 kg (121 lb 4.1 oz) Height:  5\' 2"  (157.5 cm)  BEHAVIORAL SYMPTOMS/MOOD NEUROLOGICAL BOWEL NUTRITION STATUS      Incontinent Diet (D3, thin liquids)  AMBULATORY STATUS COMMUNICATION OF NEEDS Skin   Extensive Assist Verbally Normal                       Personal Care Assistance Level of Assistance  Bathing, Dressing Bathing Assistance: Limited assistance Feeding assistance: Independent Dressing Assistance: Limited assistance     Functional Limitations Info    Sight Info: Adequate Hearing Info: Adequate Speech Info: Adequate    SPECIAL CARE FACTORS FREQUENCY  PT (By licensed PT), OT (By licensed OT)     PT Frequency: 5x/wk OT Frequency: 5x/wk            Contractures      Additional Factors Info      Allergies Info: NKDA           Current Medications (11/27/2015):  This is the current hospital active  medication list Current Facility-Administered Medications  Medication Dose Route Frequency Provider Last Rate Last Dose  . acetaminophen (TYLENOL) tablet 325-650 mg  325-650 mg Oral Q4H PRN Bary Leriche, PA-C   325 mg at 11/12/15 0957  . acetaminophen (TYLENOL) tablet 650 mg  650 mg Oral Q6H PRN Bary Leriche, PA-C   650 mg at 11/18/15 2202  . alum & mag hydroxide-simeth (MAALOX/MYLANTA) 200-200-20 MG/5ML suspension 30 mL  30 mL Oral Q4H PRN Bary Leriche, PA-C      . antiseptic oral rinse (CPC / CETYLPYRIDINIUM CHLORIDE 0.05%) solution 7 mL  7 mL Mouth Rinse q12n4p Pamela S Love, PA-C   7 mL at 11/27/15 1221  . atorvastatin (LIPITOR) tablet 40 mg  40 mg Oral q1800 Ivan Anchors  Love, PA-C   40 mg at 11/26/15 1744  . bethanechol (URECHOLINE) tablet 10 mg  10 mg Oral QID Meredith Staggers, MD   10 mg at 11/27/15 1221  . bisacodyl (DULCOLAX) suppository 10 mg  10 mg Rectal Daily PRN Bary Leriche, PA-C      . diphenhydrAMINE (BENADRYL) 12.5 MG/5ML elixir 12.5-25 mg  12.5-25 mg Oral Q6H PRN Bary Leriche, PA-C   25 mg at 11/02/15 0054  . enoxaparin (LOVENOX) injection 40 mg  40 mg Subcutaneous Q24H Ivan Anchors Love, PA-C   40 mg at 11/27/15 0948  . gabapentin (NEURONTIN) capsule 400 mg  400 mg Oral TID Meredith Staggers, MD   400 mg at 11/27/15 0949  . Gerhardt's butt cream   Topical QID Ivan Anchors Love, PA-C      . guaiFENesin-dextromethorphan [facility] DM) 100-10 MG/5ML syrup 5-10 mL  5-10 mL Oral Q6H PRN Bary Leriche, PA-C      . lactose free nutrition (BOOST PLUS) liquid 237 mL  237 mL Oral TID PRN Meredith Staggers, MD      . lidocaine (LIDODERM) 5 % 2 patch  2 patch Transdermal Q24H Bary Leriche, PA-C   2 patch at 11/10/15 2018  . lidocaine (XYLOCAINE) 2 % jelly   Topical PRN Meredith Staggers, MD   1 application at [id number]  . metoprolol succinate (TOPROL-XL) 24 hr tablet 37.5 mg  37.5 mg Oral QHS Ivan Anchors Love, PA-C   37.5 mg at 11/26/15 2252  . pantoprazole (PROTONIX) EC tablet 40 mg  40 mg Oral Daily Bary Leriche, PA-C   40 mg at 11/27/15 0949  . polyethylene glycol (MIRALAX / GLYCOLAX) packet 17 g  17 g Oral BID Bary Leriche, PA-C   17 g at 11/27/15 0949  . prochlorperazine (COMPAZINE) tablet 5-10 mg  5-10 mg Oral Q6H PRN Bary Leriche, PA-C       Or  . prochlorperazine (COMPAZINE) injection 5-10 mg  5-10 mg Intramuscular Q6H PRN Bary Leriche, PA-C       Or  . prochlorperazine (COMPAZINE) suppository 12.5 mg  12.5 mg Rectal Q6H PRN Bary Leriche, PA-C      . QUEtiapine (SEROQUEL) tablet 12.5 mg  12.5 mg Oral QHS Ivan Anchors Love, PA-C   12.5 mg at 11/26/15 2256  . tamsulosin (FLOMAX) capsule 0.4 mg  0.4 mg Oral QPC supper Meredith Staggers, MD   0.4 mg at 11/26/15  1745  . traMADol (ULTRAM) tablet 25 mg  25 mg Oral QID PRN Bary Leriche, PA-C   25 mg at 11/17/15 2030     Discharge Medications: Please see discharge summary for a list  of discharge medications.  Relevant Imaging Results:  Relevant Lab Results:   Additional Information SSN:  [id number]  Lennart Pall, LCSW

## 2015-11-27 NOTE — Progress Notes (Addendum)
Subjective/Complaints: No new issues. Emptying bladder well. Able to stand with assistance now.   ROS: Denies CP, SOB, N/V/D   Objective: Vital Signs: Blood pressure 108/59, pulse 72, temperature 98.6 F (37 C), temperature source Oral, resp. rate 18, height 5' 2"  (1.575 m), weight 55 kg (121 lb 4.1 oz), SpO2 98 %. No results found. Results for orders placed or performed during the hospital encounter of 10/27/15 (from the past 72 hour(s))  CBC     Status: Abnormal   Collection Time: 11/25/15  7:42 AM  Result Value Ref Range   WBC 4.2 4.0 - 10.5 K/uL   RBC 3.61 (L) 3.87 - 5.11 MIL/uL   Hemoglobin 10.9 (L) 12.0 - 15.0 g/dL   HCT 32.7 (L) 36.0 - 46.0 %   MCV 90.6 78.0 - 100.0 fL   MCH 30.2 26.0 - 34.0 pg   MCHC 33.3 30.0 - 36.0 g/dL   RDW 16.1 (H) 11.5 - 15.5 %   Platelets 243 150 - 400 K/uL  Basic metabolic panel     Status: None   Collection Time: 11/25/15  7:42 AM  Result Value Ref Range   Sodium 140 135 - 145 mmol/L   Potassium 4.3 3.5 - 5.1 mmol/L   Chloride 108 101 - 111 mmol/L   CO2 23 22 - 32 mmol/L   Glucose, Bld 90 65 - 99 mg/dL   BUN 9 6 - 20 mg/dL   Creatinine, Ser 0.52 0.44 - 1.00 mg/dL   Calcium 9.0 8.9 - 10.3 mg/dL   GFR calc non Af Amer >60 >60 mL/min   GFR calc Af Amer >60 >60 mL/min    Comment: (NOTE) The eGFR has been calculated using the CKD EPI equation. This calculation has not been validated in all clinical situations. eGFR's persistently <60 mL/min signify possible Chronic Kidney Disease.    Anion gap 9 5 - 15     HEENT: Normocephalic. Atraumatic. Cardio: RRR Resp: CTA B/L GI: BS positive and NT, ND Skin:   Intact. Warm and dry. Neuro: Alert/Oriented Motor 4/5 B/l UE now---improved grip as well, [place] coordination B/l LE 2+/5 hip flexion, knee extension, wiggle bilateral toes.  Musc/Skel:  No tenderness. No edema Gen: NAD. Vital signs reviewed. Psych: Mood, affect and behavior appropriate  Assessment/Plan: 1. 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 Physiatrist is providing close team supervision and 24 hour management of active medical problems listed below. Physiatrist and rehab team continue to assess barriers to discharge/monitor patient progress toward functional and medical goals. FIM: Function - Bathing Bathing activity did not occur: N/A Position: Wheelchair/chair at sink Body parts bathed by patient: Right arm, Left arm, Chest, Abdomen Body parts bathed by helper: Front perineal area, Buttocks, Right upper leg, Left upper leg, Right lower leg, Left lower leg Bathing not applicable: Front perineal area, Buttocks, Right upper leg, Left upper leg, Right lower leg, Left lower leg Assist Level: 2 helpers (per nursing)  Function- Upper Body Dressing/Undressing Upper body dressing/undressing activity did not occur: Refused What is the patient wearing?: Pull over shirt/dress Pull over shirt/dress - Perfomed by patient: Thread/unthread right sleeve, Thread/unthread left sleeve, Put head through opening, Pull shirt over trunk Pull over shirt/dress - Perfomed by helper: Pull shirt over trunk, Thread/unthread right sleeve, Thread/unthread left sleeve Assist Level: Set up Set up : To obtain clothing/put away Function - Lower Body Dressing/Undressing What is the patient  wearing?: Underwear, Pants, Ted Hose, Non-skid slipper socks Position: Bed Underwear - Performed by helper: Thread/unthread right underwear leg, Thread/unthread left underwear leg, Pull underwear up/down Pants- Performed by helper: Thread/unthread right pants leg, Thread/unthread left pants leg, Pull pants up/down Non-skid slipper socks- Performed by helper: Don/doff right sock, Don/doff left sock Shoes - Performed by helper: Don/doff right shoe, Don/doff left shoe, Fasten right, Fasten left AFO - Performed by helper: Don/doff right  AFO, Don/doff left AFO TED Hose - Performed by helper: Don/doff right TED hose, Don/doff left TED hose Assist for footwear: Dependant Assist for lower body dressing: 2 Helpers  Function - Toileting Toileting activity did not occur: No continent bowel/bladder event Toileting steps completed by patient: Performs perineal hygiene Toileting steps completed by helper: Adjust clothing prior to toileting, Performs perineal hygiene, Adjust clothing after toileting Toileting Assistive Devices: Toilet aid Assist level: Touching or steadying assistance (Pt.75%)  Function - Air cabin crew transfer activity did not occur: Safety/medical concerns Toilet transfer assistive device: Elevated toilet seat/BSC over toilet, Mechanical lift Mechanical lift: Stedy Assist level to toilet: Touching or steadying assistance (Pt > 75%) Assist level from toilet: Moderate assist (Pt 50 - 74%/lift or lower)  Function - Chair/bed transfer Chair/bed transfer activity did not occur: Safety/medical concerns (unable to tolerate sitting upright EOB ) Chair/bed transfer method: Lateral scoot Chair/bed transfer assist level: Supervision or verbal cues Chair/bed transfer assistive device: Sliding board, Armrests Mechanical lift: Stedy Chair/bed transfer details: Verbal cues for safe use of DME/AE  Function - Locomotion: Wheelchair Will patient use wheelchair at discharge?: Yes Type: Motorized Wheelchair activity did not occur: Safety/medical concerns Max wheelchair distance: 200 Assist Level: No help, No cues, assistive device, takes more than reasonable amount of time Assist Level: No help, No cues, assistive device, takes more than reasonable amount of time Assist Level: No help, No cues, assistive device, takes more than reasonable amount of time Turns around,maneuvers to table,bed, and toilet,negotiates 3% grade,maneuvers on rugs and over doorsills: Yes Function - Locomotion: Ambulation Ambulation activity  did not occur: Safety/medical concerns  Function - Comprehension Comprehension: Auditory Comprehension assist level: Understands complex 90% of the time/cues 10% of the time  Function - Expression Expression: Verbal Expression assist level: Expresses complex 90% of the time/cues < 10% of the time  Function - Social Interaction Social Interaction assist level: Interacts appropriately 90% of the time - Needs monitoring or encouragement for participation or interaction.  Function - Problem Solving Problem solving assist level: Solves basic 90% of the time/requires cueing < 10% of the time  Function - Memory Memory assist level: Recognizes or recalls 90% of the time/requires cueing < 10% of the time Patient normally able to recall (first 3 days only): Current season  Medical Problem List and Plan: 1.  Quadriplegia, numbness, gait instability secondary to GBS  -continue CIR therapies     2.  DVT Prophylaxis/Anticoagulation: Pharmaceutical: Lovenox  -dopplers with LLE peroneal/gastroc dvt--follow up doppler with clearing but new left soleal thrombus---continue current lovenox dosing---re-check LLE tomorrow  3. Pain Management: Tylenol prn. Low dose tramadol prn  -gabapentin 443m tid with good results 4. Mood: LCSW to follow for evaluation and support.   5. Neuropsych: This patient is not fully capable of making decisions on her own behalf. 6. Skin/Wound Care: Routine pressure relief measures.   7. Fluids/Electrolytes/Nutrition: Monitor I/O. Poor appetite  improving, eating 100% of her diet yesterday.  -BMET normal on 4/6    -encourage PO 8.  Neurogenic bladder/Citrobacter Koseri UTI:   -  continent  -Urecholine- continue at 16m QID--begin tapering today  -bactrim completed  -flomax     - Continue up to commode/toilet to empty 9. Colon cancer with liver mets: No complaints of pain.    10. UGIB with ABLA: H/H stable and anemia resolving. Continue to monitor for any recurrent signs of  bleeding.   -encourage fluids and ongoing acclimation.  -hgb 10.9 on 4/12      LOS (Days) 31 A FACE TO FACE EVALUATION WAS PERFORMED  SWARTZ,ZACHARY T 11/27/2015, 8:49 AM

## 2015-11-27 NOTE — Progress Notes (Addendum)
ANTICOAGULATION CONSULT NOTE - Initial Consult  Pharmacy Consult for coumadin Indication: DVT  No Known Allergies  Patient Measurements: Height: 5\' 2"  (157.5 cm) Weight: 121 lb 4.1 oz (55 kg) IBW/kg (Calculated) : 50.1  Vital Signs: Temp: 97.6 F (36.4 C) (04/14 1300) Temp Source: Oral (04/14 1300) BP: 98/54 mmHg (04/14 1300) Pulse Rate: 77 (04/14 1300)  Labs:  Recent Labs  11/25/15 0742  HGB 10.9*  HCT 32.7*  PLT 243  CREATININE 0.52    Estimated Creatinine Clearance: 46.6 mL/min (by C-G formula based on Cr of 0.52).   Medical History: Past Medical History  Diagnosis Date  . Anemia   . Blood transfusion without reported diagnosis jan 2015  . Heart murmur   . Hypertension     no bp meds   . Liver cyst   . C. difficile colitis 03/05/14, 03/22/14, 04/05/14    recurrent  . Stroke ([place]) 12/2014    NUMBNESS ON LEFT SIDE  . Cervical cancer ([place]) 1972  . Colon cancer ([place]) JAN 2015  . Colon cancer ([place])   . Cancer ([place])     liver  . Complication of anesthesia     slow to awaken in past, DONE WELL RECENTLY  . Tinnitus of both ears ALL THE TIME  . DVT (deep venous thrombosis) ([place]) 10/24/13    Right leg    Medications:  Prescriptions prior to admission  Medication Sig Dispense Refill Last Dose  . acetaminophen (TYLENOL) 500 MG tablet Take 500 mg by mouth every 6 (six) hours as needed for mild pain.   10/04/2015  . aspirin EC 81 MG tablet Take 1 tablet (81 mg total) by mouth daily. (Patient taking differently: Take 81 mg by mouth every morning. ) 90 tablet 0 10/05/2015 at Unknown time  . atorvastatin (LIPITOR) 40 MG tablet Take 1 tablet (40 mg total) by mouth daily. 90 tablet 0 10/05/2015 at Unknown time  . cephALEXin (KEFLEX) 500 MG capsule Take 1 capsule (500 mg total) by mouth 4 (four) times daily. For 4 days     . metoprolol succinate (TOPROL-XL) 25 MG 24 hr tablet Take 37.5 mg by mouth every evening.    10/05/2015 at 1800  . Multiple Vitamin (MULTIVITAMIN WITH  MINERALS) TABS tablet Take 1 tablet by mouth every morning.    10/05/2015 at Unknown time  . QUEtiapine (SEROQUEL) 25 MG tablet Take 0.5 tablets (12.5 mg total) by mouth at bedtime.      Scheduled:  . antiseptic oral rinse  7 mL Mouth Rinse q12n4p  . atorvastatin  40 mg Oral q1800  . bethanechol  10 mg Oral QID  . enoxaparin (LOVENOX) injection  40 mg Subcutaneous Q24H  . gabapentin  400 mg Oral TID  . Gerhardt's butt cream   Topical QID  . lidocaine  2 patch Transdermal Q24H  . metoprolol succinate  37.5 mg Oral QHS  . pantoprazole  40 mg Oral Daily  . polyethylene glycol  17 g Oral BID  . QUEtiapine  12.5 mg Oral QHS  . tamsulosin  0.4 mg Oral QPC supper    Assessment: 80 yo female with LLE subacute deep vein thrombosis. Pharmacy has been consulted to dose coumadin. Spoke to [employer], [place] and continuing with lovenox 40mg  sq q24hr.  -Hg= 10.9 and plt= 243 on 4/12  Goal of Therapy:  INR 2-3 Monitor platelets by anticoagulation protocol: Yes   Plan:  -Baseline PT/INR now -Coumadin 5mg  po today as long as baseline INR is not  elevated -Daily PT/INR -Will begin education process  Alain Marion D 11/27/2015 4:09 PM  Addendum -baseline INR= 1.22  Plan -Coumadin 5mg  po tonight  Hildred Laser, Pharm D 11/27/2015 5:31 PM

## 2015-11-27 NOTE — Progress Notes (Signed)
Physical Therapy Session Note  Patient Details  Name: JALONDA WILMOTT MRN: [id number] Date of Birth: 22-Dec-1935  Today's Date: 11/27/2015 PT Individual Time: 1100-1200 PT Individual Time Calculation (min): 60 min   Short Term Goals: Week 5:  PT Short Term Goal 1 (Week 5): = LTGs due to anticipated LOS  Skilled Therapeutic Interventions/Progress Updates:   Session focused on bed mobility, wheelchair mobility and parts management, slide board transfers to low compliant couch surface to R and L and from bed, and simulated car transfer to sedan height using 30" slide board. Patient required supervision-min A with assist to place/stabilize slide board and cues for sequencing, technique, hand placement, head hips relationship. Patient left sitting in wheelchair with lunch meal tray and needs in reach.    Therapy Documentation Precautions:  Precautions Precautions: Fall Precaution Comments: quadriparesis, move BLE very carefully due to pain Restrictions Weight Bearing Restrictions: No Pain: Pain Assessment Pain Assessment: No/denies pain   See Function Navigator for Current Functional Status.   Therapy/Group: Individual Therapy  Laretta Alstrom 11/27/2015, 12:24 PM

## 2015-11-27 NOTE — Progress Notes (Signed)
Social Work Patient ID: Cynthia [name], female   DOB: 1935-08-19, 80 y.o.   MRN: [id number]   Met with pt and daughter this afternoon to discuss d/c plans.  Daughter, [name],  had privately shared with me earlier in the day that she and her brother do not feel that she can meet the care needs of her mother and that short term SNF placement is their preferred plan.  Pt was tearful as we talked about the SNF option but I did explain that it does keep her in a setting where she will continue with daily therapies vs 3 x/week with HH.  Pt very reluctantly agreed to "give it a try".  Alerting tx team of change in d/c plan to SNF.  Will begin bed search.  Symia Herdt, LCSW

## 2015-11-27 NOTE — Progress Notes (Signed)
Occupational Therapy Session Note  Patient Details  Name: Cynthia [name] MRN: [id number] Date of Birth: 1935/08/21  Today's Date: 11/27/2015 OT Individual Time: 0800-0900 OT Individual Time Calculation (min): 60 min    Short Term Goals: Week 3:  OT Short Term Goal 1 (Week 3): Pt will sit EOB with mod A for 10 mins in preparation for performing UB dressing tasks OT Short Term Goal 2 (Week 3): Pt will perform drop arm BSC SBT transfer with max A OT Short Term Goal 3 (Week 3): Pt will perform LB bathing tasks with mod A at bed level OT Short Term Goal 4 (Week 3): Pt will direct care when requiring assistance for positioning and bed mobility with min verbal cues  Skilled Therapeutic Interventions/Progress Updates:    Pt initially engaged in bed mobility and functional transfers with SBT.  Pt stated she received a "night bath" and had on clean clothes.  Pt performed supine>sit EOB with supervision and SBT with supervision after setup.  Pt completed all grooming tasks seated in w/c at sink without assistance.  Pt propelled w/c to therapy gym and engaged in dynamic sitting tasks with focus on lateral leans to retrieve items on both sides and place them on opposite side.  Pt performed all tasks with supervision.  Pt continues to exhibit increased core strength and dynamic sitting balance.  Therapy Documentation Precautions:  Precautions Precautions: Fall Precaution Comments: quadriparesis, move BLE very carefully due to pain Restrictions Weight Bearing Restrictions: No Pain: Pain Assessment Pain Assessment: No/denies pain  See Function Navigator for Current Functional Status.   Therapy/Group: Individual Therapy  Leroy Libman 11/27/2015, 9:30 AM

## 2015-11-27 NOTE — Discharge Instructions (Signed)
Inpatient Rehab Discharge Instructions  FAITHMARIE BARSHINGER Discharge date and time:  12/01/15  Activities/Precautions/ Functional Status: Activity: no lifting, driving, or strenuous exercise for till cleared by MD Diet:  Soft foods Wound Care: none needed   Functional status:  ___ No restrictions     ___ Walk up steps independently ___ 24/7 supervision/assistance   ___ Walk up steps with assistance ___ Intermittent supervision/assistance  ___ Bathe/dress independently ___ Walk with walker     ___ Bathe/dress with assistance ___ Walk Independently    ___ Shower independently _X__ Walk with assistance    _X__ Shower with assistance _X__ No alcohol     ___ Return to work/school ________    COMMUNITY REFERRALS UPON DISCHARGE:    Home Health:   PT     OT                      Agency:  [facility] Phone: [phone]   Medical Equipment/Items Ordered:  Wheelchair, hospital bed, commode and transfer board                                                      Agency/Supplier: [facility] [place] RESOURCES FOR PATIENT/FAMILY:  Support Groups:  See [employer] newsletter/ website        Special Instructions:    My questions have been answered and I understand these instructions. I will adhere to these goals and the provided educational materials after my discharge from the hospital.  Patient/Caregiver Signature _______________________________ Date __________  Clinician Signature _______________________________________ Date __________  Please bring this form and your medication list with you to all your follow-up doctor's appointments.

## 2015-11-27 NOTE — Progress Notes (Signed)
Occupational Therapy Note  Patient Details  Name: BERDENE LUSKIN MRN: [id number] Date of Birth: 1935-12-06  Today's Date: 11/27/2015 OT Individual Time: 1300-1330 OT Individual Time Calculation (min): 30 min   Pt c/o ongoing "tingling" in BUE/BLE; RN aware, emotional support Individual Therapy  Pt propelled w/c to gym and performed SBT to mat with supervision after setup.  Pt engaged in dynamic sitting activities while reaching to objects approx 5" from floor.  Pt hesitant and anxious at first but gained confidence with continued repetition.  Focus on dynamic sitting balance to increase independence with BADLs and specifically toileting tasks.   [name] [facility] 11/27/2015, 2:23 PM

## 2015-11-27 NOTE — Progress Notes (Signed)
Physical Therapy Session Note  Patient Details  Name: Cynthia [name] MRN: [id number] Date of Birth: February 22, 1936  Today's Date: 11/27/2015 PT Individual Time: 0900-0945 PT Individual Time Calculation (min): 45 min    Skilled Therapeutic Interventions/Progress Updates:    Pt fatigued from back to back therapies but able to work through session with minimal rest breaks. Focused on functional slideboard transfers during session with overall steady assist (for balance and assist to place slideboard) and pt directing transfers with extra time, neuro re-ed for sit <> stands, standing balance, and postural control in Stedy (min to supervision assist) while engaging in Connect 4 game, and w/c propulsion back to room for overall endurance and mobility. Pt request to return to bed to rest before next session and positioned in supine with pillows under hips for pressure relief and PRAFO's donned.   Therapy Documentation Precautions:  Precautions Precautions: Fall Precaution Comments: quadriparesis, move BLE very carefully due to pain Restrictions Weight Bearing Restrictions: No  Pain: Neuropathic pain in BUE and BLE - reports premedication.   See Function Navigator for Current Functional Status.   Therapy/Group: Individual Therapy  Canary Brim Ivory Broad, PT, DPT  11/27/2015, 11:05 AM

## 2015-11-27 NOTE — Progress Notes (Signed)
Received report from vascular lab,Pam Love PAC has been notified about results,keep monitoring pt. Closely and assessing her needs.

## 2015-11-27 NOTE — Progress Notes (Signed)
*  PRELIMINARY RESULTS* Vascular Ultrasound Left lower extremity venous duplex has been completed.  Preliminary findings: Findings consistent with subacute deep vein thrombosis involving the left soleal vein, mid calf, and a short segment of the left peroneal veins, mid calf.   Called results to [place], Angie, Collins, RVT  11/27/2015, 3:18 PM

## 2015-11-28 ENCOUNTER — Inpatient Hospital Stay (HOSPITAL_COMMUNITY): Payer: Medicare Other | Admitting: Occupational Therapy

## 2015-11-28 DIAGNOSIS — I82502 Chronic embolism and thrombosis of unspecified deep veins of left lower extremity: Secondary | ICD-10-CM

## 2015-11-28 LAB — PROTIME-INR
INR: 1.22 (ref 0.00–1.49)
Prothrombin Time: 15.6 seconds — ABNORMAL HIGH (ref 11.6–15.2)

## 2015-11-28 MED ORDER — RIVAROXABAN 15 MG PO TABS
15.0000 mg | ORAL_TABLET | Freq: Two times a day (BID) | ORAL | Status: DC
  Administered 2015-11-28 – 2015-12-01 (×7): 15 mg via ORAL
  Filled 2015-11-28 (×7): qty 1

## 2015-11-28 MED ORDER — RIVAROXABAN 15 MG PO TABS: 15.0000 mg | ORAL_TABLET | Freq: Two times a day (BID) | ORAL | Status: DC

## 2015-11-28 NOTE — Progress Notes (Signed)
Subjective/Complaints: Upset that she is going to a SNF now. Does not want to take coumadin.   ROS: Denies CP, SOB, N/V/D   Objective: Vital Signs: Blood pressure 135/64, pulse 68, temperature 97.8 F (36.6 C), temperature source Oral, resp. rate 18, height 5\' 2"  (1.575 m), weight 52.8 kg (116 lb 6.5 oz), SpO2 97 %. No results found. Results for orders placed or performed during the hospital encounter of 10/27/15 (from the past 72 hour(s))  Protime-INR     Status: Abnormal   Collection Time: 11/27/15  4:22 PM  Result Value Ref Range   Prothrombin Time 15.5 (H) 11.6 - 15.2 seconds   INR 1.22 0.00 - 1.49  Protime-INR     Status: Abnormal   Collection Time: 11/28/15  6:27 AM  Result Value Ref Range   Prothrombin Time 15.6 (H) 11.6 - 15.2 seconds   INR 1.22 0.00 - 1.49     HEENT: Normocephalic. Atraumatic. Cardio: RRR Resp: CTA B/L GI: BS positive and NT, ND Skin:   Intact. Warm and dry. Neuro: Alert/Oriented Motor 4/5 B/l UE now---improved grip as well, [place] coordination B/l LE 2+/5 hip flexion, knee extension, wiggle bilateral toes.  Musc/Skel:  No tenderness. No edema Gen: NAD. Vital signs reviewed. Psych: Mood, affect and behavior appropriate  Assessment/Plan: 1. 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 Physiatrist is providing close team supervision and 24 hour management of active medical problems listed below. Physiatrist and rehab team continue to assess barriers to discharge/monitor patient progress toward functional and medical goals. FIM: Function - Bathing Bathing activity did not occur: N/A Position: Wheelchair/chair at sink Body parts bathed by patient: Right arm, Left arm, Chest, Abdomen Body parts bathed by helper: Front perineal area, Buttocks, Right upper leg, Left upper leg, Right lower leg, Left lower leg Bathing not applicable: Front  perineal area, Buttocks, Right upper leg, Left upper leg, Right lower leg, Left lower leg Assist Level: 2 helpers (per nursing)  Function- Upper Body Dressing/Undressing Upper body dressing/undressing activity did not occur: Refused What is the patient wearing?: Pull over shirt/dress Pull over shirt/dress - Perfomed by patient: Thread/unthread right sleeve, Thread/unthread left sleeve, Put head through opening, Pull shirt over trunk Pull over shirt/dress - Perfomed by helper: Pull shirt over trunk, Thread/unthread right sleeve, Thread/unthread left sleeve Assist Level: Set up Set up : To obtain clothing/put away Function - Lower Body Dressing/Undressing What is the patient wearing?: Underwear, Pants, Ted Hose, Non-skid slipper socks Position: Bed Underwear - Performed by helper: Thread/unthread right underwear leg, Thread/unthread left underwear leg, Pull underwear up/down Pants- Performed by helper: Thread/unthread right pants leg, Thread/unthread left pants leg, Pull pants up/down Non-skid slipper socks- Performed by helper: Don/doff right sock, Don/doff left sock Shoes - Performed by helper: Don/doff right shoe, Don/doff left shoe, Fasten right, Fasten left AFO - Performed by helper: Don/doff right AFO, Don/doff left AFO TED Hose - Performed by helper: Don/doff right TED hose, Don/doff left TED hose Assist for footwear: Dependant Assist for lower body dressing: 2 Helpers  Function - Toileting Toileting activity did not occur: No continent bowel/bladder event Toileting steps completed by patient: Performs perineal hygiene Toileting steps completed by helper: Adjust clothing prior to toileting, Adjust clothing after toileting Toileting Assistive Devices: Toilet aid Assist level: Touching or steadying assistance (Pt.75%)  Function - Air cabin crew transfer activity did not occur: Safety/medical concerns Toilet transfer assistive device: Elevated  toilet seat/BSC over toilet,  Mechanical lift Mechanical lift: Stedy Assist level to toilet: Touching or steadying assistance (Pt > 75%) Assist level from toilet: Moderate assist (Pt 50 - 74%/lift or lower)  Function - Chair/bed transfer Chair/bed transfer activity did not occur: Safety/medical concerns (unable to tolerate sitting upright EOB ) Chair/bed transfer method: Lateral scoot Chair/bed transfer assist level: Touching or steadying assistance (Pt > 75%) Chair/bed transfer assistive device: Sliding board, Armrests Mechanical lift: Stedy Chair/bed transfer details: Verbal cues for safe use of DME/AE  Function - Locomotion: Wheelchair Will patient use wheelchair at discharge?: Yes Type: Manual Wheelchair activity did not occur: Safety/medical concerns Max wheelchair distance: 300 Assist Level: No help, No cues, assistive device, takes more than reasonable amount of time Assist Level: No help, No cues, assistive device, takes more than reasonable amount of time Assist Level: No help, No cues, assistive device, takes more than reasonable amount of time Turns around,maneuvers to table,bed, and toilet,negotiates 3% grade,maneuvers on rugs and over doorsills: Yes Function - Locomotion: Ambulation Ambulation activity did not occur: Safety/medical concerns  Function - Comprehension Comprehension: Auditory Comprehension assist level: Follows complex conversation/direction with no assist  Function - Expression Expression: Verbal Expression assist level: Expresses complex ideas: With no assist  Function - Social Interaction Social Interaction assist level: Interacts appropriately with others - No medications needed.  Function - Problem Solving Problem solving assist level: Solves complex problems: Recognizes & self-corrects  Function - Memory Memory assist level: Complete Independence: No helper Patient normally able to recall (first 3 days only): Current season  Medical Problem List and Plan: 1.   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  DVT Prophylaxis/Anticoagulation: spent extensive time discussing with patient this morning  -persistent LLE DVT on doppler  -she is high risk for propagation.  -does NOT want coumadin (based on experience a friend had). Is willing to take xarelto  -close monitoring of blood counts 3. Pain Management: Tylenol prn. Low dose tramadol prn  -gabapentin 400mg  tid with good results 4. Mood: LCSW to follow for evaluation and support.   5. Neuropsych: This patient is not fully capable of making decisions on her own behalf. 6. Skin/Wound Care: Routine pressure relief measures.   7. Fluids/Electrolytes/Nutrition: Monitor I/O. Poor appetite  improving, eating 100% of her diet yesterday.  -BMET normal on 4/6    -encourage PO 8.  Neurogenic bladder/Citrobacter Koseri UTI:   -continent  -Urecholine-10mg  qid currently  -bactrim completed  -flomax     - Continue up to commode/toilet to empty 9. Colon cancer with liver mets: No complaints of pain.    10. UGIB with ABLA: H/H stable and anemia resolving. Continue to monitor for any recurrent signs of bleeding.   -encourage fluids and ongoing acclimation.  -hgb 10.9 on 4/12      LOS (Days) 32 A FACE TO FACE EVALUATION WAS PERFORMED  Cynthia [name] T 11/28/2015, 9:07 AM

## 2015-11-28 NOTE — Progress Notes (Signed)
Occupational Therapy Session Note  Patient Details  Name: Cynthia [name] MRN: [id number] Date of Birth: 13-Aug-1936  Today's Date: 11/28/2015 OT Individual Time: 1330-1415 OT Individual Time Calculation (min): 45 min    Short Term Goals: Week 1:  OT Short Term Goal 1 (Week 1): Pt will tolerate 7 minutes of functional task with no more than 2 rest breaks in order to increase endurance for functional tasks.  OT Short Term Goal 1 - Progress (Week 1): Met OT Short Term Goal 2 (Week 1): Pt will perform UB bathing  in supported position with min A in order to increase I with self care.  OT Short Term Goal 2 - Progress (Week 1): Discontinued (comment) (Pt receiving nursing night baths at present) OT Short Term Goal 3 (Week 1): Pt will perform toilet transfer with assistance of 1 person in order to decrease level of assist for functional transfers.  OT Short Term Goal 3 - Progress (Week 1): Progressing toward goal OT Short Term Goal 4 (Week 1): Pt willl tolerate sitting wheelchair for functional tasks at sink for 20 minutes or greater in order to increase activity tolerance.  OT Short Term Goal 4 - Progress (Week 1): Met Week 2:  OT Short Term Goal 1 (Week 2): Pt will perform toilet/drop arm BSC transfer with assistance of 1 person (max A) OT Short Term Goal 1 - Progress (Week 2): Progressing toward goal OT Short Term Goal 2 (Week 2): Pt will sit EOB with mod A for 10 mins in prepearation for performing UB dressing tasks OT Short Term Goal 2 - Progress (Week 2): Progressing toward goal OT Short Term Goal 3 (Week 2): Pt will don pull over shirt with min A in supported sitting OT Short Term Goal 3 - Progress (Week 2): Met OT Short Term Goal 4 (Week 2): Pt will brush teeth with supervision/set up. OT Short Term Goal 4 - Progress (Week 2): Met Week 3:  OT Short Term Goal 1 (Week 3): Pt will sit EOB with mod A for 10 mins in preparation for performing UB dressing tasks OT Short Term Goal 2 (Week  3): Pt will perform drop arm BSC SBT transfer with max A OT Short Term Goal 3 (Week 3): Pt will perform LB bathing tasks with mod A at bed level OT Short Term Goal 4 (Week 3): Pt will direct care when requiring assistance for positioning and bed mobility with min verbal cues  Skilled Therapeutic Interventions/Progress Updates:    Skilled OT intervention with treatment focus on the following:  Transfers with sliding board, standing tolerance, LE stretching.  Pt/daughter in room upon OT arrival.  Daughter, [name] practiced transferring pt from wc to bed with min verbal cues for feet placement.  Pt did transfer with min assist.  Positoned in supine.  Deborah did LE stretching exercises.  Pt stated and demonstrated her hand exercises and commented on how much progress she had made with the hand movements.  Used stedy for 5 times with sit to stand and standing balance for 1  Minute each time.  the patient returned to bed and Left pt inbed with  call bell,phone within reach.      Therapy Documentation Precautions:  Precautions Precautions: Fall Precaution Comments: quadriparesis, move BLE very carefully due to pain Restrictions Weight Bearing Restrictions: No    Vital Signs: Therapy Vitals Temp: 97.6 F (36.4 C) Temp Source: Oral Pulse Rate: 91 Resp: 18 BP: 106/62 mmHg Patient Position (if appropriate): Sitting Oxygen  Therapy SpO2: 97 % O2 Device: Not Delivered Pain:  3/10  Legs with stretching   See Function Navigator for Current Functional Status.   Therapy/Group: Individual Therapy  Lisa Roca 11/28/2015, 4:25 PM

## 2015-11-29 ENCOUNTER — Inpatient Hospital Stay (HOSPITAL_COMMUNITY): Payer: Medicare Other | Admitting: Physical Therapy

## 2015-11-29 NOTE — Progress Notes (Signed)
Subjective/Complaints: Pt feeling better today. Has come to "terms" that there will be another step before she goes home. .   ROS: Denies CP, SOB, N/V/D   Objective: Vital Signs: Blood pressure 114/65, pulse 77, temperature 98.9 F (37.2 C), temperature source Oral, resp. rate 17, height 5\' 2"  (1.575 m), weight 52.9 kg (116 lb 10 oz), SpO2 96 %. No results found. Results for orders placed or performed during the hospital encounter of 10/27/15 (from the past 72 hour(s))  Protime-INR     Status: Abnormal   Collection Time: 11/27/15  4:22 PM  Result Value Ref Range   Prothrombin Time 15.5 (H) 11.6 - 15.2 seconds   INR 1.22 0.00 - 1.49  Protime-INR     Status: Abnormal   Collection Time: 11/28/15  6:27 AM  Result Value Ref Range   Prothrombin Time 15.6 (H) 11.6 - 15.2 seconds   INR 1.22 0.00 - 1.49     HEENT: Normocephalic. Atraumatic. Cardio: RRR Resp: CTA B/L GI: BS positive and NT, ND Skin:   Intact. Warm and dry. Neuro: Alert/Oriented Motor 4/5 B/l UE now---improved grip as well, [place] coordination B/l LE 2+/5 hip flexion, knee extension, wiggle bilateral toes.  Musc/Skel:  No tenderness. No edema Gen: NAD. Vital signs reviewed. Psych: Mood, affect and behavior appropriate  Assessment/Plan: 1. 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 Physiatrist is providing close team supervision and 24 hour management of active medical problems listed below. Physiatrist and rehab team continue to assess barriers to discharge/monitor patient progress toward functional and medical goals. FIM: Function - Bathing Bathing activity did not occur: N/A Position: Wheelchair/chair at sink Body parts bathed by patient: Right arm, Left arm, Chest, Abdomen Body parts bathed by helper: Front perineal area, Buttocks, Right upper leg, Left upper leg, Right lower leg, Left lower  leg Bathing not applicable: Front perineal area, Buttocks, Right upper leg, Left upper leg, Right lower leg, Left lower leg Assist Level: 2 helpers (per nursing)  Function- Upper Body Dressing/Undressing Upper body dressing/undressing activity did not occur: Refused What is the patient wearing?: Pull over shirt/dress Pull over shirt/dress - Perfomed by patient: Thread/unthread right sleeve, Thread/unthread left sleeve, Put head through opening, Pull shirt over trunk Pull over shirt/dress - Perfomed by helper: Pull shirt over trunk, Thread/unthread right sleeve, Thread/unthread left sleeve Assist Level: Set up Set up : To obtain clothing/put away Function - Lower Body Dressing/Undressing What is the patient wearing?: Underwear, Pants, Ted Hose, Non-skid slipper socks Position: Bed Underwear - Performed by helper: Thread/unthread right underwear leg, Thread/unthread left underwear leg, Pull underwear up/down Pants- Performed by helper: Thread/unthread right pants leg, Thread/unthread left pants leg, Pull pants up/down Non-skid slipper socks- Performed by helper: Don/doff right sock, Don/doff left sock Shoes - Performed by helper: Don/doff right shoe, Don/doff left shoe, Fasten right, Fasten left AFO - Performed by helper: Don/doff right AFO, Don/doff left AFO TED Hose - Performed by helper: Don/doff right TED hose, Don/doff left TED hose Assist for footwear: Dependant Assist for lower body dressing: 2 Helpers  Function - Toileting Toileting activity did not occur: No continent bowel/bladder event Toileting steps completed by patient: Performs perineal hygiene Toileting steps completed by helper: Adjust clothing prior to toileting, Adjust clothing after toileting Toileting Assistive Devices: Toilet aid Assist level: Touching or steadying assistance (Pt.75%)  Function - Air cabin crew transfer activity did not occur: Safety/medical concerns Toilet  transfer assistive device:  Mechanical lift, Elevated toilet seat/BSC over toilet Mechanical lift: Stedy Assist level to toilet: Moderate assist (Pt 50 - 74%/lift or lower) Assist level from toilet: Moderate assist (Pt 50 - 74%/lift or lower)  Function - Chair/bed transfer Chair/bed transfer activity did not occur: Safety/medical concerns (unable to tolerate sitting upright EOB ) Chair/bed transfer method: Lateral scoot Chair/bed transfer assist level: Touching or steadying assistance (Pt > 75%) Chair/bed transfer assistive device: Sliding board, Armrests Mechanical lift: Stedy Chair/bed transfer details: Verbal cues for safe use of DME/AE, Verbal cues for precautions/safety  Function - Locomotion: Wheelchair Will patient use wheelchair at discharge?: Yes Type: Manual Wheelchair activity did not occur: Safety/medical concerns Max wheelchair distance: 300 Assist Level: No help, No cues, assistive device, takes more than reasonable amount of time Assist Level: No help, No cues, assistive device, takes more than reasonable amount of time Assist Level: No help, No cues, assistive device, takes more than reasonable amount of time Turns around,maneuvers to table,bed, and toilet,negotiates 3% grade,maneuvers on rugs and over doorsills: Yes Function - Locomotion: Ambulation Ambulation activity did not occur: Safety/medical concerns  Function - Comprehension Comprehension: Auditory Comprehension assist level: Follows complex conversation/direction with no assist  Function - Expression Expression: Verbal Expression assist level: Expresses complex ideas: With no assist  Function - Social Interaction Social Interaction assist level: Interacts appropriately with others - No medications needed.  Function - Problem Solving Problem solving assist level: Solves complex problems: Recognizes & self-corrects  Function - Memory Memory assist level: Complete Independence: No helper Patient normally able to recall (first 3  days only): Current season  Medical Problem List and Plan: 1.  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  DVT Prophylaxis/Anticoagulation: spent extensive time discussing with patient this morning  -persistent LLE DVT on doppler  -she is high risk for propagation.  -xarelto  -check hgb tomorrow 3. Pain Management: Tylenol prn. Low dose tramadol prn  -gabapentin 400mg  tid with good results 4. Mood: LCSW to follow for evaluation and support.   5. Neuropsych: This patient is not fully capable of making decisions on her own behalf. 6. Skin/Wound Care: Routine pressure relief measures.   7. Fluids/Electrolytes/Nutrition: Monitor I/O.    -BMET normal on 4/6    -encourage PO 8.  Neurogenic bladder/Citrobacter Koseri UTI:   -continent  -Urecholine-10mg  qid currently  -bactrim completed  -flomax     - Continue up to commode/toilet to empty 9. Colon cancer with liver mets: No complaints of pain.    10. UGIB with ABLA: H/H stable and anemia resolving. Continue to monitor for any recurrent signs of bleeding.   -encourage fluids and ongoing acclimation.  -hgb 10.9 on 4/12---recheck CBC tomorrow      LOS (Days) 33 A FACE TO FACE EVALUATION WAS PERFORMED  SWARTZ,ZACHARY T 11/29/2015, 11:01 AM

## 2015-11-29 NOTE — Progress Notes (Signed)
Physical Therapy Session Note  Patient Details  Name: Cynthia [name] MRN: [id number] Date of Birth: [birth year]  Today's Date: 11/29/2015 PT Individual Time: 1100-1200 PT Individual Time Calculation (min): 60 min   Short Term Goals: Week 5:  PT Short Term Goal 1 (Week 5): = LTGs due to anticipated LOS  Skilled Therapeutic Interventions/Progress Updates:   Patient in bed with daughter present, transferred to edge of bed with supervision using rail and performed slide board transfer to wheelchair with supervision and assist to place/stabilize slide board. Session focused on community and outdoor wheelchair propulsion using BUE throughout hospital, on/off elevators, over thresholds, and on uneven brick and concrete surfaces with mod I and on inclines/declines and up/down ramp to employee parking garage with supervision. Patient tolerated well and left sitting in wheelchair to return to room with daughter.   Therapy Documentation Precautions:  Precautions Precautions: Fall Precaution Comments: quadriparesis, move BLE very carefully due to pain Restrictions Weight Bearing Restrictions: No Pain: Pain Assessment Pain Assessment: No/denies pain Pain Score: 0-No pain  See Function Navigator for Current Functional Status.   Therapy/Group: Individual Therapy  Laretta Alstrom 11/29/2015, 12:02 PM

## 2015-11-30 ENCOUNTER — Inpatient Hospital Stay (HOSPITAL_COMMUNITY): Payer: Self-pay | Admitting: Physical Therapy

## 2015-11-30 ENCOUNTER — Inpatient Hospital Stay (HOSPITAL_COMMUNITY): Payer: Medicare Other | Admitting: Physical Therapy

## 2015-11-30 ENCOUNTER — Inpatient Hospital Stay (HOSPITAL_COMMUNITY): Payer: Self-pay | Admitting: Occupational Therapy

## 2015-11-30 ENCOUNTER — Inpatient Hospital Stay (HOSPITAL_COMMUNITY): Payer: Medicare Other

## 2015-11-30 LAB — CBC
HEMATOCRIT: 32.6 % — AB (ref 36.0–46.0)
HEMOGLOBIN: 10.1 g/dL — AB (ref 12.0–15.0)
MCH: 28.4 pg (ref 26.0–34.0)
MCHC: 31 g/dL (ref 30.0–36.0)
MCV: 91.6 fL (ref 78.0–100.0)
Platelets: 233 10*3/uL (ref 150–400)
RBC: 3.56 MIL/uL — AB (ref 3.87–5.11)
RDW: 16.2 % — ABNORMAL HIGH (ref 11.5–15.5)
WBC: 4.6 10*3/uL (ref 4.0–10.5)

## 2015-11-30 MED ORDER — POLYETHYLENE GLYCOL 3350 17 G PO PACK: 17.0000 g | PACK | Freq: Two times a day (BID) | ORAL | Status: DC

## 2015-11-30 MED ORDER — TAMSULOSIN HCL 0.4 MG PO CAPS: 0.4000 mg | ORAL_CAPSULE | Freq: Every day | ORAL | Status: DC

## 2015-11-30 MED ORDER — GABAPENTIN 400 MG PO CAPS: 400.0000 mg | ORAL_CAPSULE | Freq: Three times a day (TID) | ORAL | Status: DC

## 2015-11-30 MED ORDER — RIVAROXABAN 20 MG PO TABS: 20.0000 mg | ORAL_TABLET | Freq: Every day | ORAL | Status: DC

## 2015-11-30 MED ORDER — BOOST PLUS PO LIQD: 237.0000 mL | Freq: Three times a day (TID) | ORAL | Status: DC | PRN

## 2015-11-30 MED ORDER — PANTOPRAZOLE SODIUM 40 MG PO TBEC: 40.0000 mg | DELAYED_RELEASE_TABLET | Freq: Every day | ORAL | Status: DC

## 2015-11-30 MED ORDER — RIVAROXABAN 15 MG PO TABS: 15.0000 mg | ORAL_TABLET | Freq: Two times a day (BID) | ORAL | Status: DC

## 2015-11-30 MED ORDER — QUETIAPINE FUMARATE 25 MG PO TABS: 12.5000 mg | ORAL_TABLET | Freq: Every day | ORAL | Status: DC

## 2015-11-30 MED ORDER — GERHARDT'S BUTT CREAM: 1.0000 "application " | TOPICAL_CREAM | Freq: Four times a day (QID) | CUTANEOUS | Status: DC

## 2015-11-30 NOTE — Progress Notes (Signed)
Physical Therapy Discharge Summary  Patient Details  Name: Cynthia [name] MRN: [id number] Date of Birth: 1936-07-18   Patient has met 8 of 8 long term goals due to improved activity tolerance, improved balance, improved postural control, increased strength, increased range of motion, decreased pain, ability to compensate for deficits, functional use of  right upper extremity, right lower extremity, left upper extremity and left lower extremity and improved coordination.  Patient to discharge at a wheelchair level Supervision.   Patient's care partner unavailable to provide the necessary physical assistance at discharge, therefore DC planned to SNF.  Reasons goals not met: NA  Recommendation:  Patient will benefit from ongoing skilled PT services in skilled nursing facility setting to continue to advance safe functional mobility, address ongoing impairments in strength, balance, coordination, activity tolerance, and minimize fall risk.  Equipment: No equipment provided  Reasons for discharge: treatment goals met and discharge from hospital  Patient/family agrees with progress made and goals achieved: Yes  PT Discharge Precautions/Restrictions Precautions Precautions: Fall Precaution Comments: LE weakness Restrictions Weight Bearing Restrictions: No Pain Pain Assessment Pain Assessment: No/denies pain Vision/Perception   No change from baseline  Cognition Overall Cognitive Status: Within Functional Limits for tasks assessed Arousal/Alertness: Awake/alert Orientation Level: Oriented X4 Focused Attention: Appears intact Sustained Attention: Appears intact Memory: Appears intact Awareness: Appears intact Problem Solving: Appears intact Initiating: Appears intact Safety/Judgment: Appears intact Sensation Sensation Light Touch: Impaired Detail Light Touch Impaired Details: Impaired RUE;Impaired LUE;Impaired RLE;Impaired LLE Hot/Cold: Impaired Detail Hot/Cold Impaired  Details: Impaired RUE;Impaired LUE;Impaired RLE;Impaired LLE Proprioception: Impaired Detail Proprioception Impaired Details: Impaired RUE;Impaired LUE;Impaired RLE;Impaired LLE Additional Comments: impaired to LT below knees BLE and below elbows BUE bilaterally, numbness and tingling worse in hands and feet  Coordination Gross Motor Movements are Fluid and Coordinated: No Fine Motor Movements are Fluid and Coordinated: No Motor  Motor Motor: Abnormal postural alignment and control;Paraplegia Motor - Discharge Observations: improved BUE strength, BLE strength improved but not yet functional for safe ambulation at this time  Mobility Bed Mobility Bed Mobility: Rolling Right;Rolling Left;Sit to Supine;Supine to Sit Rolling Right: 5: Supervision Rolling Left: 5: Supervision Supine to Sit: 5: Supervision;HOB flat Sit to Supine: 5: Supervision;HOB flat Transfers Transfers: Yes Sit to Stand: Other/comment;5: Supervision (STEDY) Stand to Sit: 5: Supervision;Other (comment) (STEDY) Squat Pivot Transfers: 5: Supervision;4: Min guard Lateral/Scoot Transfers: 5: Supervision;With slide board Locomotion  Ambulation Ambulation: No Gait Gait: No Stairs / Additional Locomotion Stairs: No Ramp: 5: Supervision (wheelchair propulsion) Product manager Mobility: Yes Wheelchair Assistance: 6: Modified independent (Device/Increase time) Environmental health practitioner: Both upper extremities Wheelchair Parts Management: Independent Distance: > 300 ft  Trunk/Postural Assessment  Cervical Assessment Cervical Assessment: Exceptions to [facility] (forward head) Thoracic Assessment Thoracic Assessment: Exceptions to WFL (slight kyphosis) Lumbar Assessment Lumbar Assessment: Within Functional Limits Postural Control Postural Control: Within Functional Limits Trunk Control: improved Righting Reactions: improved Protective Responses: improved  Balance Balance Balance Assessed: Yes Static Sitting  Balance Static Sitting - Balance Support: No upper extremity supported;Feet supported Static Sitting - Level of Assistance: 6: Modified independent (Device/Increase time) Dynamic Sitting Balance Dynamic Sitting - Balance Support: During functional activity;Feet supported Dynamic Sitting - Level of Assistance: 5: Stand by assistance Extremity Assessment  RUE Assessment RUE Assessment: Within Functional Limits LUE Assessment LUE Assessment: Within Functional Limits RLE Assessment RLE Assessment: Exceptions to [facility] RLE Strength RLE Overall Strength: Deficits RLE Overall Strength Comments: 3-/5 hip flexion, 3+/5 knee flexion/extension, 2-/5 ankle DF LLE Assessment LLE Assessment: Exceptions to [facility] LLE  Strength LLE Overall Strength: Deficits LLE Overall Strength Comments: 3-/5 hip flexion, 4/5 knee flexion/extension, 2-/5 ankle DF   See Function Navigator for Current Functional Status.  Carney Living A 11/30/2015, 5:11 PM

## 2015-11-30 NOTE — Progress Notes (Signed)
Social Work Patient ID: Cynthia [name], female   DOB: [birth year], 80 y.o.   MRN: [id number]   Received SNF bed offer today from [facility].  Pt and daughter aware and accepting this bed with plans to transfer to facility tomorrow via [facility].  Tx team aware.  Makilah Dowda, LCSW

## 2015-11-30 NOTE — Progress Notes (Signed)
Physical Therapy Session Note  Patient Details  Name: Cynthia [name] MRN: [id number] Date of Birth: 1936-07-01  Today's Date: 11/30/2015 PT Individual Time: 4628-6381 PT Individual Time Calculation (min): 31 min   Short Term Goals: Week 4:  PT Short Term Goal 1 (Week 4): = LTGs   Skilled Therapeutic Interventions/Progress Updates:    Pt received resting in w/c and agreeable to therapy session, no c/o pain.  Pt propelled w/c to and from therapy gym mod I.  Squat/pivot to therapy mat from w/c with close supervision.  Sit<>stand x4 in stedy and standing x1 minute each trial while playing connect 4.  Pt returned to w/c with squat/pivot and steady assist at end of session 2/2 fatigue.  Pt positioned in w/c at end of session with call bell in reach and needs met.   Therapy Documentation Precautions:  Precautions Precautions: Fall Precaution Comments: quadriparesis, move BLE very carefully due to pain Restrictions Weight Bearing Restrictions: No General:   Vital Signs: Therapy Vitals Temp: 98.1 F (36.7 C) Temp Source: Oral Pulse Rate: 89 Resp: 16 BP: 105/64 mmHg Patient Position (if appropriate): Lying Oxygen Therapy SpO2: 98 % O2 Device: Not Delivered Pain:   Mobility:   Locomotion :    Trunk/Postural Assessment :    Balance:   Exercises:   Other Treatments:     See Function Navigator for Current Functional Status.   Therapy/Group: Individual Therapy  Earnest Conroy Penven-Crew 11/30/2015, 3:44 PM

## 2015-11-30 NOTE — Progress Notes (Signed)
Occupational Therapy Session Note  Patient Details  Name: Cynthia [name] MRN: [id number] Date of Birth: [birth year]  Today's Date: 11/30/2015 OT Individual Time: 0900-1000 OT Individual Time Calculation (min): 60 min    Short Term Goals: Week 3:  OT Short Term Goal 1 (Week 3): Pt will sit EOB with mod A for 10 mins in preparation for performing UB dressing tasks OT Short Term Goal 2 (Week 3): Pt will perform drop arm BSC SBT transfer with max A OT Short Term Goal 3 (Week 3): Pt will perform LB bathing tasks with mod A at bed level OT Short Term Goal 4 (Week 3): Pt will direct care when requiring assistance for positioning and bed mobility with min verbal cues  Skilled Therapeutic Interventions/Progress Updates:    Pt resting in bed upon arrival and stated that nursing had complete bathing tasks and assisted with dressing prior to therapy.  Pt sat EOB and performed sliding board transfer with supervision after setup.  Pt completed grooming tasks from w/c and propelled to therapy gym.  Pt engaged in dynamic sitting balance tasks from edge of mat including retrieving items from near floor level requiring patient to reach forward and laterally.  Pt noted that she could feel a stretch in her lower back but not uncomfortable.  Pt performed SBT to w/c and propelled to her room.  Pt remained in w/c with all needs within reach.  Focus on activity tolerance, functional transfers, BUE use, dynamic sitting balance, and safety awareness to increase independence with BADLs.   Therapy Documentation Precautions:  Precautions Precautions: Fall Precaution Comments: quadriparesis, move BLE very carefully due to pain Restrictions Weight Bearing Restrictions: No   Pain:  Pt c/o ongoing "tingling" in BUE/BLE; emotional support  See Function Navigator for Current Functional Status.   Therapy/Group: Individual Therapy  Leroy Libman 11/30/2015, 10:07 AM

## 2015-11-30 NOTE — Progress Notes (Signed)
Recreational Therapy Discharge Summary Patient Details  Name: Cynthia [name] MRN: [id number] Date of Birth: Mar 25, 1936 Today's Date: 11/30/2015  Long term goals set: 1  Long term goals met: 1  Comments on progress toward goals: Pt has made great progress during LOS and is ready for discharge to SNF for continued therapies & 24 hour supervision/assist.  Pt is supervision/set up level for simple TR tasks seated w/c level, needing extra time for task completion.  Pt is motivated to regain further independence.  Reasons for discharge: discharge from hospital  Patient/family agrees with progress made and goals achieved: Yes  Cynthia [name] 11/30/2015, 3:44 PM

## 2015-11-30 NOTE — Progress Notes (Signed)
Physical Therapy Session Note  Patient Details  Name: Cynthia [name] MRN: [id number] Date of Birth: 07-Nov-1935  Today's Date: 11/30/2015 PT Individual Time: 1100-1200 and 1415-1445 PT Individual Time Calculation (min): 60 min and 30 min  Short Term Goals: Week 5:  PT Short Term Goal 1 (Week 5): = LTGs due to anticipated LOS  Skilled Therapeutic Interventions/Progress Updates:   Treatment 1: Patient in wheelchair, propelled on unit with mod I. Performed squat pivot transfer wheelchair <> NuStep with mod A and cues for technique. Performed NuStep using BUE/BLE at level 6 x 15 min for neuro re-ed and muscular endurance. Patient instructed in squat pivot transfer training wheelchair <> level mat table with PT assisting with foot placement with supervision and verbal cues for technique. Patient performed scooting to L and R along mat with improved technique and hip clearance with min verbal cues for head hips relationship and foot placement. Patient performed squat pivot transfer from wheelchair <> raised 24" mat table with supervision and verbal cues for technique and propelled wheelchair back to room, left sitting in wheelchair with all needs in reach.   Treatment 2: Patient in wheelchair, daughter present for session. Patient propelled wheelchair with mod I in controlled and home environments, performed squat pivot transfer from wheelchair <> regular bed in ADL apartment with supervision and verbal cues for technique, transferred sit <> supine with heavy dependence on UE to lift LE onto bed with increased time, multiple attempts, and supervision, and performed simulated car transfer to sedan height via slide board with min A. Patient left sitting in room with needs in reach and daughter present.    Therapy Documentation Precautions:  Precautions Precautions: Fall Precaution Comments: quadriparesis, move BLE very carefully due to pain Restrictions Weight Bearing Restrictions: No Pain: Pain  Assessment Pain Assessment: No/denies pain   See Function Navigator for Current Functional Status.   Therapy/Group: Individual Therapy  Laretta Alstrom 11/30/2015, 12:35 PM

## 2015-11-30 NOTE — Progress Notes (Signed)
Subjective/Complaints: In good spirits today. Had a good day yesterday. Had pizza for dinner!  ROS: Denies CP, SOB, N/V/D   Objective: Vital Signs: Blood pressure 97/60, pulse 64, temperature 98.6 F (37 C), temperature source Oral, resp. rate 18, height 5\' 2"  (1.575 m), weight 52.3 kg (115 lb 4.8 oz), SpO2 97 %. No results found. Results for orders placed or performed during the hospital encounter of 10/27/15 (from the past 72 hour(s))  Protime-INR     Status: Abnormal   Collection Time: 11/27/15  4:22 PM  Result Value Ref Range   Prothrombin Time 15.5 (H) 11.6 - 15.2 seconds   INR 1.22 0.00 - 1.49  Protime-INR     Status: Abnormal   Collection Time: 11/28/15  6:27 AM  Result Value Ref Range   Prothrombin Time 15.6 (H) 11.6 - 15.2 seconds   INR 1.22 0.00 - 1.49  CBC     Status: Abnormal   Collection Time: 11/30/15  4:41 AM  Result Value Ref Range   WBC 4.6 4.0 - 10.5 K/uL   RBC 3.56 (L) 3.87 - 5.11 MIL/uL   Hemoglobin 10.1 (L) 12.0 - 15.0 g/dL   HCT 32.6 (L) 36.0 - 46.0 %   MCV 91.6 78.0 - 100.0 fL   MCH 28.4 26.0 - 34.0 pg   MCHC 31.0 30.0 - 36.0 g/dL   RDW 16.2 (H) 11.5 - 15.5 %   Platelets 233 150 - 400 K/uL     HEENT: Normocephalic. Atraumatic. Cardio: RRR Resp: CTA B/L GI: BS positive and NT, ND Skin:   Intact. Warm and dry. Neuro: Alert/Oriented Motor 4/5 B/l UE now---improved grip as well, [place] coordination B/l LE 2+/5 hip flexion, knee extension, wiggle bilateral toes.  Musc/Skel:  No tenderness. No edema Gen: NAD. Vital signs reviewed. Psych: Mood, affect and behavior appropriate  Assessment/Plan: 1. 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 Physiatrist is providing close team supervision and 24 hour management of active medical problems listed below. Physiatrist and rehab team continue to assess barriers to discharge/monitor patient  progress toward functional and medical goals. FIM: Function - Bathing Bathing activity did not occur: N/A Position: Wheelchair/chair at sink Body parts bathed by patient: Right arm, Left arm, Chest, Abdomen Body parts bathed by helper: Front perineal area, Buttocks, Right upper leg, Left upper leg, Right lower leg, Left lower leg Bathing not applicable: Front perineal area, Buttocks, Right upper leg, Left upper leg, Right lower leg, Left lower leg Assist Level: 2 helpers (per nursing)  Function- Upper Body Dressing/Undressing Upper body dressing/undressing activity did not occur: Refused What is the patient wearing?: Pull over shirt/dress Pull over shirt/dress - Perfomed by patient: Thread/unthread right sleeve, Thread/unthread left sleeve, Put head through opening, Pull shirt over trunk Pull over shirt/dress - Perfomed by helper: Pull shirt over trunk, Thread/unthread right sleeve, Thread/unthread left sleeve Assist Level: Set up Set up : To obtain clothing/put away Function - Lower Body Dressing/Undressing What is the patient wearing?: Underwear, Pants, Ted Hose, Non-skid slipper socks Position: Bed Underwear - Performed by helper: Thread/unthread right underwear leg, Thread/unthread left underwear leg, Pull underwear up/down Pants- Performed by helper: Thread/unthread right pants leg, Thread/unthread left pants leg, Pull pants up/down Non-skid slipper socks- Performed by helper: Don/doff right sock, Don/doff left sock Shoes - Performed by helper: Don/doff right shoe, Don/doff left shoe, Fasten right, Fasten left AFO - Performed by helper: Don/doff right  AFO, Don/doff left AFO TED Hose - Performed by helper: Don/doff right TED hose, Don/doff left TED hose Assist for footwear: Dependant Assist for lower body dressing: 2 Helpers  Function - Toileting Toileting activity did not occur: No continent bowel/bladder event Toileting steps completed by patient: Performs perineal  hygiene Toileting steps completed by helper: Adjust clothing prior to toileting, Adjust clothing after toileting Toileting Assistive Devices: Toilet aid Assist level: Touching or steadying assistance (Pt.75%)  Function - Air cabin crew transfer activity did not occur: Safety/medical concerns Toilet transfer assistive device: Mechanical lift, Elevated toilet seat/BSC over toilet Mechanical lift: Stedy Assist level to toilet: Moderate assist (Pt 50 - 74%/lift or lower) Assist level from toilet: Moderate assist (Pt 50 - 74%/lift or lower)  Function - Chair/bed transfer Chair/bed transfer activity did not occur: Safety/medical concerns (unable to tolerate sitting upright EOB ) Chair/bed transfer method: Lateral scoot Chair/bed transfer assist level: Supervision or verbal cues Chair/bed transfer assistive device: Sliding board, Armrests Mechanical lift: Stedy Chair/bed transfer details: Verbal cues for safe use of DME/AE, Verbal cues for precautions/safety  Function - Locomotion: Wheelchair Will patient use wheelchair at discharge?: Yes Type: Manual Wheelchair activity did not occur: Safety/medical concerns Max wheelchair distance: 500 Assist Level: Supervision or verbal cues Assist Level: Supervision or verbal cues Assist Level: Supervision or verbal cues Turns around,maneuvers to table,bed, and toilet,negotiates 3% grade,maneuvers on rugs and over doorsills: Yes Function - Locomotion: Ambulation Ambulation activity did not occur: Safety/medical concerns  Function - Comprehension Comprehension: Auditory Comprehension assist level: Follows complex conversation/direction with no assist  Function - Expression Expression: Verbal Expression assist level: Expresses complex ideas: With no assist  Function - Social Interaction Social Interaction assist level: Interacts appropriately with others - No medications needed.  Function - Problem Solving Problem solving assist level:  Solves complex problems: Recognizes & self-corrects  Function - Memory Memory assist level: Complete Independence: No helper Patient normally able to recall (first 3 days only): Current season  Medical Problem List and Plan: 1.  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  DVT Prophylaxis/Anticoagulation: spent extensive time discussing with patient this morning  -persistent LLE DVT on doppler  -she is high risk for propagation.  -xarelto  -serial cbc's 3. Pain Management: Tylenol prn. Low dose tramadol prn  -gabapentin 400mg  tid with good results 4. Mood: LCSW to follow for evaluation and support.   5. Neuropsych: This patient is not fully capable of making decisions on her own behalf. 6. Skin/Wound Care: Routine pressure relief measures.   7. Fluids/Electrolytes/Nutrition: Monitor I/O.    -BMET normal on 4/6    -encourage PO 8.  Neurogenic bladder/Citrobacter Koseri UTI:   -continent  -Urecholine-10mg  qid currently---dc today  -bactrim completed  -flomax     - Continue up to commode/toilet to empty 9. Colon cancer with liver mets: No complaints of pain.    10. UGIB with ABLA: H/H stable and anemia resolving. Continue to monitor for any recurrent signs of bleeding.   -encourage fluids and ongoing acclimation.  -hgb 10.1 today. No clinical signs of bleeding. Will need serial H/H while on anticoagulation  -recheck in AM tomorrow      LOS (Days) 34 A FACE TO FACE EVALUATION WAS PERFORMED  Edilson Vital T 11/30/2015, 8:39 AM

## 2015-12-01 ENCOUNTER — Inpatient Hospital Stay (HOSPITAL_COMMUNITY): Payer: Self-pay

## 2015-12-01 LAB — CBC
HEMATOCRIT: 34.2 % — AB (ref 36.0–46.0)
Hemoglobin: 10.6 g/dL — ABNORMAL LOW (ref 12.0–15.0)
MCH: 28.5 pg (ref 26.0–34.0)
MCHC: 31 g/dL (ref 30.0–36.0)
MCV: 91.9 fL (ref 78.0–100.0)
Platelets: 241 10*3/uL (ref 150–400)
RBC: 3.72 MIL/uL — ABNORMAL LOW (ref 3.87–5.11)
RDW: 16.2 % — AB (ref 11.5–15.5)
WBC: 4.3 10*3/uL (ref 4.0–10.5)

## 2015-12-01 LAB — CBC AND DIFFERENTIAL: WBC: 4.3 10*3/mL

## 2015-12-01 NOTE — Progress Notes (Signed)
Occupational Therapy Discharge Summary  Patient Details  Name: Cynthia [name] MRN: [id number] Date of Birth: 1936-02-24   Patient has met 9 of 10 long term goals due to improved activity tolerance, improved balance, postural control, ability to compensate for deficits and improved ability to perform basic mobility in prep for ADL tasks.  Pt made steady progress with BADLs and functional sliding board transfers.  Pt performs sliding board to drop arm BSC with supervision and is able to doff pants and perform hygiene without assistance. Pt continues to require max A for LB dressing tasks. .  Patient's care partner unable  to provide the necessary physical assistance at discharge so pt will continue skilled OT at the skilled level prior to d/c home.   Reasons goals not met: Pt did not met her LTG of mod A for LB dressing despite trial of multiple options of threading LB clothing (including different positions and AE). Pt still requires A and currently is max A for tasks.   Recommendation:  Patient will benefit from ongoing skilled OT services in skilled nursing facility setting to continue to advance functional skills in the area of BADL and Reduce care partner burden.  Equipment: No equipment providedDC to SNF  Reasons for discharge: discharge from hospital  Patient/family agrees with progress made and goals achieved: Yes  OT Discharge Vision/Perception  Vision- History Baseline Vision/History: Wears glasses Wears Glasses: At all times Patient Visual Report: No change from baseline Vision- Assessment Vision Assessment?: No apparent visual deficits  Cognition Overall Cognitive Status: Within Functional Limits for tasks assessed Arousal/Alertness: Awake/alert Orientation Level: Oriented X4 Attention: Focused;Sustained;Selective Focused Attention: Appears intact Sustained Attention: Appears intact Sustained Attention Impairment: Verbal complex;Functional complex Selective  Attention: Appears intact Memory: Appears intact Awareness: Appears intact Problem Solving: Appears intact Initiating: Appears intact Safety/Judgment: Appears intact Sensation Sensation Light Touch: Impaired Detail Light Touch Impaired Details: Impaired RUE;Impaired LUE;Impaired RLE;Impaired LLE Hot/Cold: Appears Intact Hot/Cold Impaired Details: Impaired RUE;Impaired LUE;Impaired RLE;Impaired LLE Proprioception: Impaired Detail Proprioception Impaired Details: Impaired RUE;Impaired LUE;Impaired RLE;Impaired LLE Additional Comments: impaired to LT below knees BLE and below elbows BUE bilaterally, numbness and tingling worse in hands and feet  Coordination Gross Motor Movements are Fluid and Coordinated: No Fine Motor Movements are Fluid and Coordinated: No Motor  Motor Motor: Abnormal postural alignment and control;Paraplegia    Trunk/Postural Assessment  Cervical Assessment Cervical Assessment: Within Functional Limits (forward head) Thoracic Assessment Thoracic Assessment: Exceptions to WFL (slight kyphosos) Lumbar Assessment Lumbar Assessment: Within Functional Limits Postural Control Postural Control: Within Functional Limits Trunk Control: improved Righting Reactions: improved Protective Responses: improved    Extremity/Trunk Assessment RUE Assessment RUE Assessment: Within Functional Limits LUE Assessment LUE Assessment: Within Functional Limits   See Function Navigator for Current Functional Status.  [name] [facility] 12/01/2015, 8:55 AM

## 2015-12-01 NOTE — Progress Notes (Signed)
Pt discharging with Ambulance at 1055 to [facility], VS taken and stable T 98.2, P 86, RR 16, BP 119/74, O2 100% RA. No complaint of pain at this time. Explained discharge medications with patient and patient daughter. No further questions at this time. Senta Kantor, Dione Plover

## 2015-12-01 NOTE — Progress Notes (Signed)
Social Work  Discharge Note  The overall goal for the admission was met for:   Discharge location: No - change in plan to SNF as family does not feel they can meet care needs  Length of Stay: Yes - 35 days  Discharge activity level: Yes - mod assist @ w/c level  Home/community participation: Yes  Services provided included: MD, RD, PT, OT, SLP, RN, TR, Pharmacy, [place]:  [facility] Medicare  Follow-up services arranged: Other: SNF @ [facility]  Comments (or additional information):  Patient/Family verbalized understanding of follow-up arrangements: Yes  Individual responsible for coordination of the follow-up plan: pt  Confirmed correct DME delivered: NA as d/c to SNF    Trek Kimball

## 2015-12-01 NOTE — Progress Notes (Signed)
Report called to admitting RN at [facility] at 1110. Karan Ramnauth, Dione Plover

## 2015-12-01 NOTE — Progress Notes (Signed)
Subjective/Complaints: No new complaints. Leaving with OT for the gym. SNF bed available today  ROS: Denies CP, SOB, N/V/D   Objective: Vital Signs: Blood pressure 126/70, pulse 73, temperature 97.7 F (36.5 C), temperature source Oral, resp. rate 16, height 5\' 2"  (1.575 m), weight 53.1 kg (117 lb 1 oz), SpO2 98 %. No results found. Results for orders placed or performed during the hospital encounter of 10/27/15 (from the past 72 hour(s))  CBC     Status: Abnormal   Collection Time: 11/30/15  4:41 AM  Result Value Ref Range   WBC 4.6 4.0 - 10.5 K/uL   RBC 3.56 (L) 3.87 - 5.11 MIL/uL   Hemoglobin 10.1 (L) 12.0 - 15.0 g/dL   HCT 32.6 (L) 36.0 - 46.0 %   MCV 91.6 78.0 - 100.0 fL   MCH 28.4 26.0 - 34.0 pg   MCHC 31.0 30.0 - 36.0 g/dL   RDW 16.2 (H) 11.5 - 15.5 %   Platelets 233 150 - 400 K/uL  CBC     Status: Abnormal   Collection Time: 12/01/15  5:56 AM  Result Value Ref Range   WBC 4.3 4.0 - 10.5 K/uL   RBC 3.72 (L) 3.87 - 5.11 MIL/uL   Hemoglobin 10.6 (L) 12.0 - 15.0 g/dL   HCT 34.2 (L) 36.0 - 46.0 %   MCV 91.9 78.0 - 100.0 fL   MCH 28.5 26.0 - 34.0 pg   MCHC 31.0 30.0 - 36.0 g/dL   RDW 16.2 (H) 11.5 - 15.5 %   Platelets 241 150 - 400 K/uL     HEENT: Normocephalic. Atraumatic. Cardio: RRR Resp: CTA B/L GI: BS positive and NT, ND Skin:   Intact. Warm and dry. Neuro: Alert/Oriented Motor 4/5 B/l UE now---improved grip as well, [place] coordination B/l LE 2+/5 hip flexion, knee extension, wiggle bilateral toes.  Musc/Skel:  No tenderness. No edema Gen: NAD. Vital signs reviewed. Psych: Mood, affect and behavior appropriate  Assessment/Plan: 1. 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 Physiatrist is providing close team supervision and 24 hour management of active medical problems listed below. Physiatrist and rehab team continue to assess barriers to  discharge/monitor patient progress toward functional and medical goals. FIM: Function - Bathing Bathing activity did not occur: N/A Position: Wheelchair/chair at sink (Bed Leel for LB) Body parts bathed by patient: Right arm, Left arm, Chest, Abdomen, Front perineal area, Buttocks, Right upper leg, Left upper leg Body parts bathed by helper: Right lower leg, Left lower leg Bathing not applicable: Front perineal area, Buttocks, Right upper leg, Left upper leg, Right lower leg, Left lower leg Assist Level: Touching or steadying assistance(Pt > 75%)  Function- Upper Body Dressing/Undressing Upper body dressing/undressing activity did not occur: Refused What is the patient wearing?: Pull over shirt/dress Pull over shirt/dress - Perfomed by patient: Thread/unthread right sleeve, Thread/unthread left sleeve, Put head through opening, Pull shirt over trunk Pull over shirt/dress - Perfomed by helper: Pull shirt over trunk, Thread/unthread right sleeve, Thread/unthread left sleeve Assist Level: Set up Set up : To obtain clothing/put away Function - Lower Body Dressing/Undressing What is the patient wearing?: Underwear, Pants, Socks, Shoes Position: Bed Underwear - Performed by patient: Pull underwear up/down Underwear - Performed by helper: Thread/unthread right underwear leg, Thread/unthread left underwear leg Pants- Performed by patient: Pull pants up/down Pants- Performed by helper: Thread/unthread right pants leg, Thread/unthread left pants leg Non-skid  slipper socks- Performed by helper: Don/doff right sock, Don/doff left sock Socks - Performed by patient: Don/doff right sock, Don/doff left sock Shoes - Performed by helper: Don/doff right shoe, Don/doff left shoe, Fasten right, Fasten left AFO - Performed by helper: Don/doff right AFO, Don/doff left AFO TED Hose - Performed by helper: Don/doff right TED hose, Don/doff left TED hose Assist for footwear: Dependant Assist for lower body  dressing: 2 Helpers  Function - Toileting Toileting activity did not occur: No continent bowel/bladder event Toileting steps completed by patient: Adjust clothing prior to toileting, Performs perineal hygiene Toileting steps completed by helper: Adjust clothing after toileting Toileting Assistive Devices: Grab bar or rail Assist level: Touching or steadying assistance (Pt.75%)  Function - Air cabin crew transfer activity did not occur: Safety/medical concerns Toilet transfer assistive device: Drop arm commode Mechanical lift: Stedy Assist level to toilet: Supervision or verbal cues Assist level from toilet: Supervision or verbal cues  Function - Chair/bed transfer Chair/bed transfer activity did not occur: Safety/medical concerns (unable to tolerate sitting upright EOB ) Chair/bed transfer method: Squat pivot Chair/bed transfer assist level: Touching or steadying assistance (Pt > 75%) (supervision first trial, steady assist second trial after several bouts of standing) Chair/bed transfer assistive device: Armrests Mechanical lift: Stedy Chair/bed transfer details: Verbal cues for technique, Verbal cues for sequencing, Manual facilitation for weight shifting  Function - Locomotion: Wheelchair Will patient use wheelchair at discharge?: Yes Type: Manual Wheelchair activity did not occur: Safety/medical concerns Max wheelchair distance: 200 Assist Level: No help, No cues, assistive device, takes more than reasonable amount of time Assist Level: No help, No cues, assistive device, takes more than reasonable amount of time Assist Level: No help, No cues, assistive device, takes more than reasonable amount of time Turns around,maneuvers to table,bed, and toilet,negotiates 3% grade,maneuvers on rugs and over doorsills: Yes Function - Locomotion: Ambulation Ambulation activity did not occur: Safety/medical concerns  Function - Comprehension Comprehension: Auditory Comprehension  assist level: Follows complex conversation/direction with no assist  Function - Expression Expression: Verbal Expression assist level: Expresses complex ideas: With no assist  Function - Social Interaction Social Interaction assist level: Interacts appropriately with others - No medications needed.  Function - Problem Solving Problem solving assist level: Solves complex problems: Recognizes & self-corrects  Function - Memory Memory assist level: Complete Independence: No helper Patient normally able to recall (first 3 days only): Current season  Medical Problem List and Plan: 1.  Quadriplegia, numbness, gait instability secondary to GBS  -to SNF today  -I can see patient back in about 3-4 weeks 2.  DVT Prophylaxis/Anticoagulation: spent extensive time discussing with patient this morning  -persistent LLE DVT on doppler  -she is high risk for propagation.  -xarelto  -recommend a repeat doppler in about 2 months depending upon her clinical picture 3. Pain Management: Tylenol prn. Low dose tramadol prn  -gabapentin 400mg  tid with good results 4. Mood: LCSW to follow for evaluation and support.   5. Neuropsych: This patient is not fully capable of making decisions on her own behalf. 6. Skin/Wound Care: Routine pressure relief measures.   7. Fluids/Electrolytes/Nutrition: Monitor I/O.    -BMET normal on 4/6    -encourage PO 8.  Neurogenic bladder/Citrobacter Koseri UTI:   -continent  -Urecholine-stopped  -bactrim completed  -flomax       9. Colon cancer with liver mets: No complaints of pain.    10. UGIB with ABLA: H/H stable and anemia resolving. Continue to monitor for any  recurrent signs of bleeding.   -encourage fluids and ongoing acclimation.  -hgb 10.6 today. No clinical signs of bleeding.   -would recommend re=check of hgb in about a week      LOS (Days) 35 A FACE TO FACE EVALUATION WAS PERFORMED  Rhett Mutschler T 12/01/2015, 8:52 AM

## 2015-12-01 NOTE — Plan of Care (Signed)
Problem: RH Dressing Goal: LTG Patient will perform lower body dressing w/assist (OT) LTG: Patient will perform lower body dressing with assist, with/without cues in positioning using equipment (OT)  Outcome: Not Met (add Reason) Pt requires max A for task.

## 2015-12-01 NOTE — Discharge Summary (Signed)
Cynthia [name], [name]           ACCOUNT NO.:  [id number]  MEDICAL RECORD NO.:  [id number]  LOCATION:  4W20C                        FACILITY:  [facility]  PHYSICIAN:  Cynthia [name], M.D.DATE OF BIRTH:  September 19, 1935  DATE OF ADMISSION:  10/27/2015 DATE OF DISCHARGE:  12/01/2015                              DISCHARGE SUMMARY    DISCHARGE DIAGNOSES: PRIMARY DIAGNOSIS:  Quadriplegia and quadriparesis.  ACTIVE PROBLEMS: 1. Guillain-Barre syndrome. 2. Paresthesias. 3. Urinary tract infection. 4. Acute blood loss anemia. 5. Upper GI bleed. 6. Adjustment disorder with mixed anxiety depressed mood. 7. Neurogenic bladder. 8. Left soleus and peroneal mid calf DVT.   DISCHARGE CONDITION:  Improved.  SIGNIFICANT DIAGNOSTIC STUDIES: LABS:   Basic metabolic panel: BMP Latest Ref Rng 11/25/2015 11/19/2015 11/13/2015  Glucose 65 - 99 mg/dL 90 90 96  BUN 6 - 20 mg/dL 9 12 10   Creatinine 0.44 - 1.00 mg/dL 0.52 0.62 0.51  Sodium 135 - 145 mmol/L 140 139 138  Potassium 3.5 - 5.1 mmol/L 4.3 4.4 4.1  Chloride 101 - 111 mmol/L 108 107 104  CO2 22 - 32 mmol/L 23 24 27   Calcium 8.9 - 10.3 mg/dL 9.0 9.0 8.8(L)    CBC: CBC Latest Ref Rng 12/01/2015 11/30/2015 11/25/2015  WBC 4.0 - 10.5 K/uL 4.3 4.6 4.2  Hemoglobin 12.0 - 15.0 g/dL 10.6(L) 10.1(L) 10.9(L)  Hematocrit 36.0 - 46.0 % 34.2(L) 32.6(L) 32.7(L)  Platelets 150 - 400 K/uL 241 233 243     HISTORY OF PRESENT ILLNESS:  Cynthia [name] is a 80 year old female with history of metastatic colon cancer s/p surgery/chemotherapy and thermal ablation for liver metastatic lesion on 09/25/2015 with low-grade fevers after procedure. She was admitted on 10/06/15 with complaints of dizziness, paresthesias, RUE/RLE numbness, BLE weakness and difficulty walking. MRI of brain was negative for stroke.  She did develop hypotension with large amount of coffee-ground emesis on 02/24 likely due to esophagitis and gastritis on Plavix.  Dr. Gillermina Hu recommended  serial H and H and hydration.  She was intubated due to respiratory insufficiency from to 02/23 to 03/05 and treated for HCAP.  She developed progressive weakness with areflexia and was started on IVIG 02/26 for 5 days.   MRI of cervical/thoracic/lumbar spine showed eveidenc of moderate to severe multilevel neural foraminal stenosis C6- C7.   She did undergo EGD on March 1 showing question of proximal linear ulcer from NG tube and was cleared to resume blood thinners. She tolerated extubation on 03/05 and was started on modified diet.  She has had issues with hallucination and confusion, felt to be due to metabolic encephalopathy as well as UTI. Mentation was improving but she continued to be limited by poor po intake, pain, fatigue, as well as diffuse weakness. CIR was recommended for followup therapies.  HOSPITAL COURSE:  Cynthia [name] was admitted to Rehab on October 27, 2015 for inpatient therapies, which consisted of PT,OT and ST at least 3 hours 5 days a week.  Past admission, physiatrist, rehab, RN, and therapy team have worked together to provide customized collaborative interdisciplinary care.  The team has worked together to provide extensive ego support to help with anxiety and motivation.  She was completed 7 day course  of ceftriaxone Citrobacter Koseri UTI treatment.  Foley was discontinued and she was started on a bladder program for neurogenic bladder.  She had urinary retention and has required in and out catherization.  She was started on Flomax as well as Urecholine to help activate bladder function. She was also educated on importance of getting up to the [facility] or toilet to help improve voiding. Bladder scans have been done to monitor PVRs and voiding function has improved with last PVRs ranging from ss bladder 43 cc to 120 cc on 04/07. She has slowly been weaned off urecholine and has continued to void without difficulty. Repeat urine culture of 03/29 showed  recurrent Citrobacter koseri  UTI and was treated with Bactrim x 10 days.  CBC has been monitored with routine checks and she has had improvement in ABLA with  hemoglobin slowly trending upwards.  Bilateral lower extremity Dopplers were done on 03/16 and showed peroneal and gastroc DVT. She was maintained on SQ lovenox and BLE TEDs with repeat Dopplers on March 30 for followup.  This showed that previous left gastroc and peroneal vein DVT had resolved however acute DVT was noted in the left soleal mid calf vein. This has been treated conservatively with serial dopplers as patient without symptoms. Repeat dopplers of April 14 showed subacute DVT involving left soleal vein mid calf DVT and short-segment left mid calf peroneal vein DVT. She was started on Xarelto for treatment due to persistent and recurrent DVTs and is to continue on this for at least 3-6 months.   Her pain and paresthesias have slowly improved with increase in activity tolerance and decrease in anxiety.  She was started on low-dose tramadol and gabapentin which was slowly titrated to 400 mg t.i.d. She has had improvement in symptoms which are currently managed on managed on gabapentin alone. Lytes at admission showed hyponatremia with sodium at 133.  As the p.o. intake improved, hyponatremia has resolved with recent check of April 12 showing sodium stable at 140 and no evidence of renal insufficiency.  Swallow function has improved and she is tolerating thin liquids without need for chin tuck. She has elected to remain on dysphagia 3 for comfort. Her length of stay was extended to help achieve level that could be managed with family assistance. she continues to require extensive assistance and family is unable to provide care needed and has on SNF for further therapies.  Bed is available at [facility] on April 18 and the patient is discharged in improved condition.   REHAB COURSE:  During the patient's stay in rehab, weekly team conferences were held to monitor the  patient's progress, set goals, as well as discussed barriers to discharge.  At admission, the patient required total assist with mobility as well as basic self-care tasks. Speech therapy evaluation revealed cognitive impairments with poor attention, problem solving as well as decreased awareness affecting ability to carry functional and familiar tasks.  She showed overt signs and symptoms of aspiration requiring chin tuck and safe swallow strategies with thin liquids.  She has had improvement in activity tolerance, balance, postural control as well as ability to compensate for deficits. She is showing improvement in functional use of bilateral upper and bilateral lower extremities.  Cognition has improved and she is currently at baseline.  She tolerating current diet without s/s of aspiration and speech therapy signed off on April 6th. She is currently able to wheelchair independently.  She is able to perform SB transfers with supervision and squat  pivot transfers with steady assist to close supervision depending on fatigue level.   She is able to bathe with min assist and requires set up to +2 assist for lower body dressing.    DISPOSITION:  Skilled nursing facility.  DIET:  Dysphagia 3, thin liquids.  SPECIAL INSTRUCTIONS: 1. Continue Xarelto 15 mg b.i.d. through May 6th a.m.. Start Xarelto 20     mg daily on May 6th with supper.    2. Needs to be toileted every 3-4 hours and get up to BSC/toilet to help with function of voiding. 3. Continued to recheck CBC every 5-7 days and monitor for signs of     Rebleeding. 4. Repeat dopplers in 2 months depending on clinical picture.    DISCHARGE MEDICATIONS: 1. Tylenol 500 mg p.o. q.6 hours p.r.n. mild pain. 2. Atorvastatin 40 mg p.o. daily. 3. Gabapentin 400 mg t.i.d. 4. Nutritional supplements t.i.d. if p.o. intake less than 50%. 5. Metoprolol 37.5 mg q.p.m. 6. Multivitamin 1 per day. 7. Protonix 40 mg daily. 8. MiraLAX 17 g in 8 ounces  of fluids 2 times a day. 9. Seroquel 12.5 mg p.o. at bedtime. 10.Rivaroxaban 15 mg p.o. b.i.d. Through May 6 th am. 11.Xarelto 20 mg daily in evenings starting May 6 th with supper.  12.Flomax 0.4 mg daily with supper. 13. Lidocaine jelly to bilateral hands p.r.n. neuropathic symptoms.    Follow up with Cynthia Staggers, MD.  Specialty:  Physical Medicine and Rehabilitation  Why:  office will call you with appointment.   Contact information:  [address] [place] [address] [phone]       Reesa Chew, P.A.   ______________________________ Cynthia [name], M.D.    PL/MEDQ  D:  12/01/2015  T:  12/01/2015  Job:  [id number]

## 2015-12-01 NOTE — Progress Notes (Signed)
Occupational Therapy Session Note  Patient Details  Name: Cynthia [name] MRN: [id number] Date of Birth: February 20, 1936  Today's Date: 12/01/2015 OT Individual Time: 0700-0800 OT Individual Time Calculation (min): 60 min    Short Term Goals: Week 3:  OT Short Term Goal 1 (Week 3): Pt will sit EOB with mod A for 10 mins in preparation for performing UB dressing tasks OT Short Term Goal 2 (Week 3): Pt will perform drop arm BSC SBT transfer with max A OT Short Term Goal 3 (Week 3): Pt will perform LB bathing tasks with mod A at bed level OT Short Term Goal 4 (Week 3): Pt will direct care when requiring assistance for positioning and bed mobility with min verbal cues  Skilled Therapeutic Interventions/Progress Updates:    Pt engaged in BADL retraining including bathing and dressing at bed level and seated in w/c at sink.  Pt continues to require increased assistance for LB dressing (max A).  Pt attempted to thread pants over feet but was unsuccessful.  Pt performed supine>sit EOB and performed SBT to w/c with supervision.  Pt completed UB bathing/dressing with supervision.  Pt ate breakfast with no assistance before propelling to ADL apartment to practice drop arm BSC transfers.  Pt performed transfer with supervision and toileting tasks with mod A. Pt returned to room and remained in w/c with all needs within reach.   Therapy Documentation Precautions:  Precautions Precautions: Fall Precaution Comments: LE weakness Restrictions Weight Bearing Restrictions: No Pain:  Pt c/o ongoing "tingling" in BUE/BLE; emotional support  See Function Navigator for Current Functional Status.   Therapy/Group: Individual Therapy  Leroy Libman 12/01/2015, 8:49 AM

## 2015-12-02 ENCOUNTER — Inpatient Hospital Stay (HOSPITAL_COMMUNITY): Payer: Self-pay | Admitting: Occupational Therapy

## 2015-12-02 ENCOUNTER — Encounter: Payer: Self-pay | Admitting: Internal Medicine

## 2015-12-02 ENCOUNTER — Non-Acute Institutional Stay (SKILLED_NURSING_FACILITY): Payer: Medicare Other | Admitting: Internal Medicine

## 2015-12-02 ENCOUNTER — Inpatient Hospital Stay (HOSPITAL_COMMUNITY): Payer: Self-pay | Admitting: Physical Therapy

## 2015-12-02 DIAGNOSIS — F4323 Adjustment disorder with mixed anxiety and depressed mood: Secondary | ICD-10-CM | POA: Diagnosis not present

## 2015-12-02 DIAGNOSIS — G610 Guillain-Barre syndrome: Secondary | ICD-10-CM | POA: Diagnosis not present

## 2015-12-02 DIAGNOSIS — K922 Gastrointestinal hemorrhage, unspecified: Secondary | ICD-10-CM | POA: Diagnosis not present

## 2015-12-02 DIAGNOSIS — N319 Neuromuscular dysfunction of bladder, unspecified: Secondary | ICD-10-CM | POA: Diagnosis not present

## 2015-12-02 DIAGNOSIS — I82492 Acute embolism and thrombosis of other specified deep vein of left lower extremity: Secondary | ICD-10-CM

## 2015-12-02 DIAGNOSIS — I639 Cerebral infarction, unspecified: Secondary | ICD-10-CM | POA: Diagnosis not present

## 2015-12-02 DIAGNOSIS — M792 Neuralgia and neuritis, unspecified: Secondary | ICD-10-CM

## 2015-12-02 DIAGNOSIS — D62 Acute posthemorrhagic anemia: Secondary | ICD-10-CM

## 2015-12-02 DIAGNOSIS — C787 Secondary malignant neoplasm of liver and intrahepatic bile duct: Secondary | ICD-10-CM

## 2015-12-02 DIAGNOSIS — R2681 Unsteadiness on feet: Secondary | ICD-10-CM

## 2015-12-02 DIAGNOSIS — R531 Weakness: Secondary | ICD-10-CM | POA: Diagnosis not present

## 2015-12-02 DIAGNOSIS — L539 Erythematous condition, unspecified: Secondary | ICD-10-CM | POA: Diagnosis not present

## 2015-12-02 DIAGNOSIS — I10 Essential (primary) hypertension: Secondary | ICD-10-CM

## 2015-12-02 DIAGNOSIS — E785 Hyperlipidemia, unspecified: Secondary | ICD-10-CM | POA: Diagnosis not present

## 2015-12-02 DIAGNOSIS — E46 Unspecified protein-calorie malnutrition: Secondary | ICD-10-CM

## 2015-12-02 DIAGNOSIS — C189 Malignant neoplasm of colon, unspecified: Secondary | ICD-10-CM | POA: Diagnosis not present

## 2015-12-02 DIAGNOSIS — K253 Acute gastric ulcer without hemorrhage or perforation: Secondary | ICD-10-CM

## 2015-12-02 NOTE — Progress Notes (Signed)
LOCATION: [facility]  PCP: [name], MD   Code Status: Full Code  Goals of care: Advanced Directive information Advanced Directives 10/14/2015  Does patient have an advance directive? No  Would patient like information on creating an advanced directive? -       Extended Emergency Contact Information Primary Emergency Contact: Celia,Deborah Address: [address], [place] [address] [place] Phone: [phone] Mobile Phone: [phone] Relation: Daughter Secondary Emergency Contact: Renetta Chalk [place] Phone: [phone] Relation: Son   No Known Allergies  Chief Complaint  Patient presents with  . New Admit To SNF    New Admission     HPI:  Patient is a 80 y.o. female seen today for short term rehabilitation post hospital admission from 10/06/15-10/27/15 and then inpatient rehabilitation from 10/27/15-12/01/15 with quadriplegia and quadriparesis from [place] syndrome. She required intubation for respiratory failure and was treated with antibiotics for HCAP and UTI. She had hypotension and coffee ground emesis thought to be from esophagitis and gastritis. EGD on March 1 showed proximal linear ulcer. She was transferred to inpatient rehabilitation. She was treated for UTI there as well. She required foley catheter for urinary retention and then in and out cath with bladder training and flomax and urecholine. Now she is off urecholine. She had intensive PT, OT and nursing rehab. She was on dysphagia diet. She was also started on anticoagulation for new left leg DVT. She was placed on gabapentin and tramadol for neuropathic pain. She has history of metastatic colon cancer s/p surgery/chemotherapy and thermal ablation for liver metastatic lesion on 09/25/2015. She is seen in her room today. She is now on mechanical soft food. She complaints of ongoing numbness and tingling to her feet mainly at  night. She also complaints of insomnia.    Review of Systems:  Constitutional: Negative for fever, chills, diaphoresis. Energy level is improving. HENT: Negative for headache, congestion, nasal discharge, hearing loss, sore throat, difficulty swallowing.   Eyes: Negative for blurred vision, double vision and discharge. Wears glasses.  Respiratory: Negative for cough, shortness of breath and wheezing.   Cardiovascular: Negative for chest pain, palpitations, leg swelling.  Gastrointestinal: Negative for heartburn, nausea, vomiting, abdominal pain, loss of appetite, diarrhea and constipation. Last bowel movement was this morning.  Genitourinary: Negative for dysuria and flank pain.  Musculoskeletal: Negative for back pain, fall in the facility.  Skin: Negative for itching, rash.  Neurological: Negative for dizziness. Psychiatric/Behavioral: Negative for depression   Past Medical History  Diagnosis Date  . Anemia   . Blood transfusion without reported diagnosis jan 2015  . Heart murmur   . Hypertension     no bp meds   . Liver cyst   . C. difficile colitis 03/05/14, 03/22/14, 04/05/14    recurrent  . Stroke ([place]) 12/2014    NUMBNESS ON LEFT SIDE  . Cervical cancer ([place]) 1972  . Colon cancer ([place]) JAN 2015  . Colon cancer ([place])   . Cancer ([place] [place])     liver  . Complication of anesthesia     slow to awaken in past, DONE WELL RECENTLY  . Tinnitus of both ears ALL THE TIME  . DVT (deep venous thrombosis) ([facility]) 10/24/13    Right leg   Past Surgical History  Procedure Laterality Date  . Back surgery  1981    lower lumbar  . Tubal ligation  1968  . Laparoscopic right colectomy Right 10/22/2013    Procedure: LAPAROSCOPIC RIGHT COLECTOMY ;  Surgeon: [name], MD;  Location: [facility];  Service: General;  Laterality: Right;  . Salpingoophorectomy  10/22/2013    Procedure: [place] Saa;  Surgeon: [name]. [name], MD;  Location: [facility];  Service: Gynecology;;  . Abdominal  hysterectomy      vaginal- partial  . Esophagogastroduodenoscopy N/A 10/14/2015    Procedure: ESOPHAGOGASTRODUODENOSCOPY (EGD);  Surgeon: Clarene Essex, MD;  Location: [facility] ENDOSCOPY;  Service: Endoscopy;  Laterality: N/A;  Bedside in ICU   Social History:   reports that she has never smoked. She has never used smokeless tobacco. She reports that she does not drink alcohol or use illicit drugs.  Family History  Problem Relation Age of Onset  . Diabetes Sister   . Cancer Sister     leukemia  . Diabetes Brother   . Heart failure Brother   . Hypertension Brother   . Breast cancer Maternal Aunt   . Rheum arthritis Mother   . Heart failure Father   . Cancer Maternal Grandmother     GIST  . Heart failure Maternal Grandfather   . Aneurysm Paternal Grandmother     Medications:   Medication List       This list is accurate as of: 12/02/15 10:49 AM.  Always use your most recent med list.               acetaminophen 500 MG tablet  Commonly known as:  TYLENOL  Take 500 mg by mouth every 6 (six) hours as needed for mild pain.     atorvastatin 40 MG tablet  Commonly known as:  LIPITOR  Take 1 tablet (40 mg total) by mouth daily.     gabapentin 400 MG capsule  Commonly known as:  NEURONTIN  Take 1 capsule (400 mg total) by mouth 3 (three) times daily.     lidocaine 2 % jelly  Commonly known as:  XYLOCAINE  Apply 1 application topically as needed (apply  bilateral hands).     metoprolol succinate 25 MG 24 hr tablet  Commonly known as:  TOPROL-XL  Take 37.5 mg by mouth every evening.     multivitamin with minerals Tabs tablet  Take 1 tablet by mouth every morning.     pantoprazole 40 MG tablet  Commonly known as:  PROTONIX  Take 1 tablet (40 mg total) by mouth daily.     polyethylene glycol packet  Commonly known as:  MIRALAX / GLYCOLAX  Take 17 g by mouth 2 (two) times daily.     QUEtiapine 25 MG tablet  Commonly known as:  SEROQUEL  Take 0.5 tablets (12.5 mg total) by  mouth at bedtime.     Rivaroxaban 15 MG Tabs tablet  Commonly known as:  XARELTO  Take 1 tablet (15 mg total) by mouth 2 (two) times daily with a meal. Till 12/19/15 am     rivaroxaban 20 MG Tabs tablet  Commonly known as:  XARELTO  Take 1 tablet (20 mg total) by mouth daily with supper. Starting 12/19/15 pm  Start taking on:  12/19/2015     tamsulosin 0.4 MG Caps capsule  Commonly known as:  FLOMAX  Take 1 capsule (0.4 mg total) by mouth daily after supper.     UNABLE TO FIND  Med Name: Med pass 120 mL 3 times a day        Immunizations: Immunization History  Administered Date(s)  Administered  . Influenza-Unspecified 05/29/2015     Physical Exam: Filed Vitals:   12/02/15 1036  BP: 140/76  Pulse: 80  Temp: 97.9 F (36.6 C)  TempSrc: Oral  Resp: 18  Height: 5\' 2"  (1.575 m)  Weight: 121 lb (54.885 kg)  SpO2: 98%   Body mass index is 22.13 kg/(m^2).  General- elderly female, thin built, in no acute distress Head- normocephalic, atraumatic Nose- no maxillary or frontal sinus tenderness, no nasal discharge Throat- moist mucus membrane  Eyes- PERRLA, EOMI, no pallor, no icterus, no discharge, normal conjunctiva, normal sclera Neck- no cervical lymphadenopathy Cardiovascular- normal s1,s2, no murmur, no leg edema Respiratory- bilateral clear to auscultation, no wheeze, no rhonchi, no crackles, no use of accessory muscles Abdomen- bowel sounds present, soft, non tender Musculoskeletal- able to move all 4 extremities, generalized weakness more in lower extremities Neurological- alert and oriented to person, place and time Skin- warm and dry, red blanchable raised surface on right medial hallux which is non tender, no warmth and has a old scab on the tip Psychiatry- normal mood and affect    Labs reviewed: Basic Metabolic Panel:  Recent Labs  01/11/15 1920  10/13/15 0415  10/14/15 0456  10/19/15 0446  11/13/15 0520 11/19/15 0511 11/25/15 11/25/15 0742  NA   --   < > 141  < >  --   < > 141  < > 138 139 140 140  K  --   < > 2.9*  < >  --   < > 4.0  < > 4.1 4.4  --  4.3  CL  --   < > 104  < >  --   < > 104  < > 104 107  --  108  CO2  --   < > 26  < >  --   < > 32  < > 27 24  --  23  GLUCOSE  --   < > 111*  < >  --   < > 114*  < > 96 90  --  90  BUN  --   < > 14  < >  --   < > 19  < > 10 12 9 9   CREATININE  --   < > 0.66  < >  --   < > 0.54  < > 0.51 0.62 0.5 0.52  CALCIUM  --   < > 7.6*  < >  --   < > 8.1*  < > 8.8* 9.0  --  9.0  MG 1.8  < > 1.8  --  2.0  --  1.7  --   --   --   --   --   PHOS 4.2  --   --   --   --   --  3.5  --   --   --   --   --   < > = values in this interval not displayed. Liver Function Tests:  Recent Labs  10/20/15 0500 10/25/15 0801 10/28/15 0652  AST 44* 32 23  ALT 29 33 20  ALKPHOS 40 46 42  BILITOT 0.4 1.0 0.9  PROT 5.9* 6.6 6.1*  ALBUMIN 1.7* 2.2* 2.0*   No results for input(s): LIPASE, AMYLASE in the last 8760 hours.  Recent Labs  01/11/15 1920  AMMONIA 18   CBC:  Recent Labs  10/19/15 0446 10/20/15 0500  10/28/15 0652  11/25/15 0742 11/30/15 0441 12/01/15 12/01/15 0556  WBC  10.2 9.3  < > 7.0  < > 4.2 4.6 4.3 4.3  NEUTROABS 8.7* 7.5  --  5.4  --   --   --   --   --   HGB 8.1* 8.9*  < > 10.7*  < > 10.9* 10.1*  --  10.6*  HCT 26.2* 28.3*  < > 32.8*  < > 32.7* 32.6*  --  34.2*  MCV 90.7 90.1  < > 91.4  < > 90.6 91.6  --  91.9  PLT 208 244  < > 405*  < > 243 233  --  241  < > = values in this interval not displayed. Cardiac Enzymes:  Recent Labs  10/07/15 1041 10/11/15 0930 10/11/15 1738 10/11/15 2214  CKTOTAL 81  --   --   --   TROPONINI  --  0.17* 0.22* 0.20*   BNP: Invalid input(s): POCBNP CBG:  Recent Labs  10/26/15 2101 10/27/15 0740 10/27/15 1126  GLUCAP 93 99 102*     Assessment/Plan  Generalized weakness Had GBS. Has made improvement since time of hospital admission. Will have her work with physical therapy and occupational therapy team to help with gait training  and muscle strengthening exercises.fall precautions. Skin care. Encourage to be out of bed.   Unsteady gait With her weakness, Will have patient work with PT/OT as tolerated to regain strength and restore function.  Fall precautions are in place. With her neuropathy, take fall precautions  Protein calorie malnutrition Get dietary consult. Continue medpass supplement and check weekly weight  Anemia blood loss With her hematemesis/ upper gi bleed. Check cbc.   Gastric ulcer Continue protonix 40 mg daily  Neuropathic pain Continue neurontin 400 mg tid and monitor  GBS Improving muscle strength. Continue to work with therapy team. Continue tylenol 500 mg q6h prn pain with lidocaine jelly as needed.   Left leg DVT Continue xarelto 15 mg bid until 12/19/15 and then 20 mg daily. Monitor clinically  Neurogenic bladder Continue bladder training. Continue flomax, stable  Depression and anxiety Situational, continue seroquel 12.5 mg daily and monitor  HTN Stable bp, check bp daily x 1week. Continue metoprolol 37.5 mg daily  HLD Continue lipitor  Colon cancer with liver metastases Continue miralax bid to help prevent constipation, f/u with oncology  Old cva Continue statin and bp meds, monitor  Skin redness With blanchable area. No signs of infection noted. Patient denies any tenderness and itching. Will get skin prep and apply foam dressing given her neuropathy to prevent pressure sore/ wound   Goals of care: short term rehabilitation   Labs/tests ordered: cbc, cmp 12/07/15  Family/ staff Communication: reviewed care plan with patient and nursing supervisor    Blanchie Serve, MD Internal Medicine [place], [place] [address] Cell Phone (Monday-Friday 8 am - 5 pm): [phone] On Call: [phone] and follow prompts after 5 pm and on weekends Office Phone: [phone] Office Fax: [phone]

## 2015-12-03 ENCOUNTER — Telehealth: Payer: Self-pay

## 2015-12-03 NOTE — Telephone Encounter (Signed)
1. Are you/is patient experiencing any problems since coming home? Are there any questions regarding any aspect of care? 2. Are there any questions regarding medications administration/dosing? Are meds being taken as prescribed? Patient should review meds with caller to confirm 3. Have there been any falls? 4. Has Home Health been to the house and/or have they contacted you? If not, have you tried to contact them? Can we help you contact them? 5. Are bowels and bladder emptying properly? Are there any unexpected incontinence issues? If applicable, is patient following bowel/bladder programs? 6. Any fevers, problems with breathing, unexpected pain? 7. Are there any skin problems or new areas of breakdown? 8. Has the patient/family member arranged specialty MD follow up (ie cardiology/neurology/renal/surgical/etc)?  Can we help arrange? 9. Does the patient need any other services or support that we can help arrange? 10. Are caregivers following through as expected in assisting the patient? 11. Has the patient quit smoking, drinking alcohol, or using drugs as recommended?  Left message with schedule coordinator at [facility] to make aware of appointment on 12/14/15 @ 1:30 pm.  Was not able to contact a nurse to answer questions.

## 2015-12-04 ENCOUNTER — Other Ambulatory Visit: Payer: Self-pay | Admitting: Nurse Practitioner

## 2015-12-04 ENCOUNTER — Telehealth: Payer: Self-pay | Admitting: Oncology

## 2015-12-04 NOTE — Telephone Encounter (Signed)
Spoke with patient daughter to confirm 5/19 appt with GBS at 1130 am

## 2015-12-07 LAB — BASIC METABOLIC PANEL
BUN: 10 mg/dL (ref 4–21)
CREATININE: 0.6 mg/dL (ref 0.5–1.1)
Glucose: 85 mg/dL
Potassium: 4.5 mmol/L (ref 3.4–5.3)
SODIUM: 144 mmol/L (ref 137–147)

## 2015-12-07 LAB — HEPATIC FUNCTION PANEL
ALK PHOS: 49 U/L (ref 25–125)
ALT: 7 U/L (ref 7–35)
AST: 12 U/L — AB (ref 13–35)
Bilirubin, Total: 0.4 mg/dL

## 2015-12-07 LAB — CBC AND DIFFERENTIAL
HCT: 35 % — AB (ref 36–46)
HEMOGLOBIN: 11 g/dL — AB (ref 12.0–16.0)
NEUTROS ABS: 3 /uL
PLATELETS: 259 10*3/uL (ref 150–399)
WBC: 4.8 10*3/mL
WBC: 4.8 10^3/mL

## 2015-12-09 ENCOUNTER — Encounter: Payer: Self-pay | Admitting: Adult Health

## 2015-12-09 ENCOUNTER — Non-Acute Institutional Stay (SKILLED_NURSING_FACILITY): Payer: Medicare Other | Admitting: Adult Health

## 2015-12-09 DIAGNOSIS — E46 Unspecified protein-calorie malnutrition: Secondary | ICD-10-CM | POA: Diagnosis not present

## 2015-12-09 DIAGNOSIS — D62 Acute posthemorrhagic anemia: Secondary | ICD-10-CM | POA: Diagnosis not present

## 2015-12-09 NOTE — Progress Notes (Signed)
Patient ID: Cynthia [name], female   DOB: May 16, 1936, 80 y.o.   MRN: [id number]    DATE:  12/09/15  MRN:  [id number]  BIRTHDAY: 04-19-1936  Facility:  Nursing Home Location:  [facility] Room Number: 207-P  LEVEL OF CARE:  SNF (31)  Contact Information    Name Relation Home Work Mobile   [place] Daughter [phone]  [phone]   Gary Fleet [phone]         Code Status History    Date Active Date Inactive Code Status Order ID Comments User Context   10/27/2015  4:12 PM 12/01/2015  2:10 PM Full Code [id number]  Bary Leriche, PA-C Inpatient   10/27/2015  4:12 PM 10/27/2015  4:12 PM Full Code [id number]  Bary Leriche, PA-C Inpatient   10/12/2015 11:28 AM 10/27/2015  4:11 PM Full Code [id number]  Chesley Mires, MD Inpatient   10/06/2015  6:24 PM 10/12/2015 11:28 AM Partial Code [id number]  Willia Craze, NP Inpatient   01/11/2015  6:44 PM 01/13/2015  4:59 PM Partial Code [id number]  Aquilla Hacker, MD Inpatient   10/22/2013 12:53 PM 10/27/2013  1:07 PM Full Code [id number]  Fanny Skates, MD Inpatient   09/03/2013  3:50 PM 09/05/2013  4:34 PM Full Code [id number]  Louellen Molder, MD Inpatient       Chief Complaint  Patient presents with  . Acute Visit    Protein calorie malnutrition, anemia    HISTORY OF PRESENT ILLNESS:  This is a 80 year old female who was noted to have albumin 2.74, low and hgb 11.  She has been admitted to [facility] on 12/01/15 from [facility]. She has PMH of hypertension, stroke, right leg DVT and colon cancer. She had inpatient rehabilitation.She was hospitalized due to quadriplegia and quadriparesis from [place] syndrome. She required intubation for respiratory failure and was treated with antibiotics for HCAP and UTI. She had hypotension and coffee ground emesis thought to be from esophagitis and gastritis. EGD on March 1 showed proximal linear ulcer. She was transferred to inpatient  rehabilitation. She was treated for UTI there as well. She required foley catheter for urinary retention and then in and out cath with bladder training and flomax and urecholine. Now she is off urecholine. She had intensive PT, OT and nursing rehab. She was on dysphagia diet. She was also started on anticoagulation for new left leg DVT. She was placed on gabapentin and tramadol for neuropathic pain. She has history of metastatic colon cancer s/p surgery/chemotherapy and thermal ablation for liver metastatic lesion on 09/25/2015  PAST MEDICAL HISTORY:  Past Medical History  Diagnosis Date  . Anemia   . Blood transfusion without reported diagnosis jan 2015  . Heart murmur   . Hypertension     no bp meds   . Liver cyst   . C. difficile colitis 03/05/14, 03/22/14, 04/05/14    recurrent  . Stroke ([place]) 12/2014    NUMBNESS ON LEFT SIDE  . Cervical cancer ([place]) 1972  . Colon cancer ([place]) JAN 2015  . Colon cancer ([place])   . Cancer ([place])     liver  . Complication of anesthesia     slow to awaken in past, DONE WELL RECENTLY  . Tinnitus of both ears ALL THE TIME  . DVT (deep venous thrombosis) ([place]) 10/24/13    Right leg     CURRENT MEDICATIONS: Reviewed  Patient's Medications  New Prescriptions   No medications on  file  Previous Medications   ACETAMINOPHEN (TYLENOL) 500 MG TABLET    Take 500 mg by mouth every 6 (six) hours as needed for mild pain.   ATORVASTATIN (LIPITOR) 40 MG TABLET    Take 1 tablet (40 mg total) by mouth daily.   GABAPENTIN (NEURONTIN) 400 MG CAPSULE    Take 1 capsule (400 mg total) by mouth 3 (three) times daily.   LIDOCAINE (XYLOCAINE) 5 % OINTMENT    Apply 1 application topically 2 (two) times daily as needed. Apply to bilateral hands BID PRN for neuropathic symptoms   METOPROLOL SUCCINATE (TOPROL-XL) 25 MG 24 HR TABLET    Take 37.5 mg by mouth every evening.    MULTIPLE VITAMIN (MULTIVITAMIN WITH MINERALS) TABS TABLET    Take 1 tablet by mouth every morning.     PANTOPRAZOLE (PROTONIX) 40 MG TABLET    Take 1 tablet (40 mg total) by mouth daily.   POLYETHYLENE GLYCOL (MIRALAX / GLYCOLAX) PACKET    Take 17 g by mouth 2 (two) times daily.   QUETIAPINE (SEROQUEL) 25 MG TABLET    Take 0.5 tablets (12.5 mg total) by mouth at bedtime.   RIVAROXABAN (XARELTO) 15 MG TABS TABLET    Take 1 tablet (15 mg total) by mouth 2 (two) times daily with a meal. Till 12/19/15 am   RIVAROXABAN (XARELTO) 20 MG TABS TABLET    Take 1 tablet (20 mg total) by mouth daily with supper. Starting 12/19/15 pm   TAMSULOSIN (FLOMAX) 0.4 MG CAPS CAPSULE    Take 1 capsule (0.4 mg total) by mouth daily after supper.   UNABLE TO FIND    Med Name: Med pass 120 mL 2 times a day  Modified Medications   No medications on file  Discontinued Medications   LIDOCAINE (XYLOCAINE) 2 % JELLY    Apply 1 application topically as needed (apply  bilateral hands).     No Known Allergies   REVIEW OF SYSTEMS:  GENERAL: no change in appetite, no fatigue, no weight changes, no fever, chills or weakness EYES: Denies change in vision, dry eyes, eye pain, itching or discharge EARS: Denies change in hearing, ringing in ears, or earache NOSE: Denies nasal congestion or epistaxis MOUTH and THROAT: Denies oral discomfort, gingival pain or bleeding, pain from teeth or hoarseness   RESPIRATORY: no cough, SOB, DOE, wheezing, hemoptysis CARDIAC: no chest pain, edema or palpitations GI: no abdominal pain, diarrhea, constipation, heart burn, nausea or vomiting GU: Denies dysuria, frequency, hematuria, incontinence, or discharge PSYCHIATRIC: Denies feeling of depression or anxiety. No report of hallucinations, insomnia, paranoia, or agitation   PHYSICAL EXAMINATION  GENERAL APPEARANCE: Well nourished. In no acute distress. Normal body habitus HEAD: Normal in size and contour. No evidence of trauma EYES: Lids open and close normally. No blepharitis, entropion or ectropion. PERRL. Conjunctivae are clear and  sclerae are white. Lenses are without opacity EARS: Pinnae are normal. Patient hears normal voice tunes of the examiner MOUTH and THROAT: Lips are without lesions. Oral mucosa is moist and without lesions. Tongue is normal in shape, size, and color and without lesions NECK: supple, trachea midline, no neck masses, no thyroid tenderness, no thyromegaly LYMPHATICS: no LAN in the neck, no supraclavicular LAN RESPIRATORY: breathing is even & unlabored, BS CTAB CARDIAC: RRR, no murmur,no extra heart sounds, no edema GI: abdomen soft, normal BS, no masses, no tenderness, no hepatomegaly, no splenomegaly EXTREMITIES:  Able to move X 4 extremities with generalized weakness PSYCHIATRIC: Alert and oriented X  3. Affect and behavior are appropriate  LABS/RADIOLOGY: Labs reviewed: Basic Metabolic Panel:  Recent Labs  01/11/15 1920  10/13/15 0415  10/14/15 0456  10/19/15 0446  11/13/15 0520 11/19/15 0511 11/25/15 11/25/15 0742 12/07/15  NA  --   < > 141  < >  --   < > 141  < > 138 139 140 140 144  K  --   < > 2.9*  < >  --   < > 4.0  < > 4.1 4.4  --  4.3 4.5  CL  --   < > 104  < >  --   < > 104  < > 104 107  --  108  --   CO2  --   < > 26  < >  --   < > 32  < > 27 24  --  23  --   GLUCOSE  --   < > 111*  < >  --   < > 114*  < > 96 90  --  90  --   BUN  --   < > 14  < >  --   < > 19  < > 10 12 9 9 10   CREATININE  --   < > 0.66  < >  --   < > 0.54  < > 0.51 0.62 0.5 0.52 0.6  CALCIUM  --   < > 7.6*  < >  --   < > 8.1*  < > 8.8* 9.0  --  9.0  --   MG 1.8  < > 1.8  --  2.0  --  1.7  --   --   --   --   --   --   PHOS 4.2  --   --   --   --   --  3.5  --   --   --   --   --   --   < > = values in this interval not displayed. Liver Function Tests:  Recent Labs  10/20/15 0500 10/25/15 0801 10/28/15 0652 12/07/15  AST 44* 32 23 12*  ALT 29 33 20 7  ALKPHOS 40 46 42 49  BILITOT 0.4 1.0 0.9  --   PROT 5.9* 6.6 6.1*  --   ALBUMIN 1.7* 2.2* 2.0*  --      Recent Labs  01/11/15 1920   AMMONIA 18   CBC:  Recent Labs  10/20/15 0500  10/28/15 0652  11/25/15 0742 11/30/15 0441 12/01/15 12/01/15 0556 12/07/15  WBC 9.3  < > 7.0  < > 4.2 4.6 4.3 4.3 4.8  4.8  NEUTROABS 7.5  --  5.4  --   --   --   --   --  3  HGB 8.9*  < > 10.7*  < > 10.9* 10.1*  --  10.6* 11.0*  HCT 28.3*  < > 32.8*  < > 32.7* 32.6*  --  34.2* 35*  MCV 90.1  < > 91.4  < > 90.6 91.6  --  91.9  --   PLT 244  < > 405*  < > 243 233  --  241 259  < > = values in this interval not displayed.  Lipid Panel:  Recent Labs  01/12/15 1400 10/07/15 0555  HDL 69 39*   Cardiac Enzymes:  Recent Labs  10/07/15 1041 10/11/15 0930 10/11/15 1738 10/11/15 2214  CKTOTAL 81  --   --   --  TROPONINI  --  0.17* 0.22* 0.20*   CBG:  Recent Labs  10/26/15 2101 10/27/15 0740 10/27/15 1126  GLUCAP 93 99 102*     ASSESSMENT/PLAN:  Protein-calorie malnutrition, severe - albumin 2.74; start Procell 2 scoops PO BID  Anemia, acute blood loss - hgb 11.0; check CBC in 1 week    [facility], NP [place]

## 2015-12-14 ENCOUNTER — Encounter: Payer: Medicare Other | Admitting: Physical Medicine & Rehabilitation

## 2015-12-16 ENCOUNTER — Telehealth: Payer: Self-pay | Admitting: Oncology

## 2015-12-16 NOTE — Telephone Encounter (Signed)
S/w pt's daughter to advise of appt chg from 5/19 to 5/25 @ 12.30 due to md pal. Pt's daughter advises pt has been in a nursing home since Feb but should be home soon and confirms she will bring t to appt on 5/25.

## 2015-12-17 ENCOUNTER — Encounter: Payer: Self-pay | Admitting: Adult Health

## 2015-12-17 ENCOUNTER — Non-Acute Institutional Stay (SKILLED_NURSING_FACILITY): Payer: Medicare Other | Admitting: Adult Health

## 2015-12-17 DIAGNOSIS — E785 Hyperlipidemia, unspecified: Secondary | ICD-10-CM | POA: Diagnosis not present

## 2015-12-17 DIAGNOSIS — F32A Depression, unspecified: Secondary | ICD-10-CM

## 2015-12-17 DIAGNOSIS — C787 Secondary malignant neoplasm of liver and intrahepatic bile duct: Secondary | ICD-10-CM

## 2015-12-17 DIAGNOSIS — F329 Major depressive disorder, single episode, unspecified: Secondary | ICD-10-CM

## 2015-12-17 DIAGNOSIS — E46 Unspecified protein-calorie malnutrition: Secondary | ICD-10-CM | POA: Diagnosis not present

## 2015-12-17 DIAGNOSIS — R531 Weakness: Secondary | ICD-10-CM

## 2015-12-17 DIAGNOSIS — M792 Neuralgia and neuritis, unspecified: Secondary | ICD-10-CM | POA: Diagnosis not present

## 2015-12-17 DIAGNOSIS — I10 Essential (primary) hypertension: Secondary | ICD-10-CM | POA: Diagnosis not present

## 2015-12-17 DIAGNOSIS — C189 Malignant neoplasm of colon, unspecified: Secondary | ICD-10-CM | POA: Diagnosis not present

## 2015-12-17 DIAGNOSIS — I639 Cerebral infarction, unspecified: Secondary | ICD-10-CM

## 2015-12-17 DIAGNOSIS — K253 Acute gastric ulcer without hemorrhage or perforation: Secondary | ICD-10-CM

## 2015-12-17 DIAGNOSIS — G610 Guillain-Barre syndrome: Secondary | ICD-10-CM | POA: Diagnosis not present

## 2015-12-17 DIAGNOSIS — N319 Neuromuscular dysfunction of bladder, unspecified: Secondary | ICD-10-CM | POA: Diagnosis not present

## 2015-12-17 DIAGNOSIS — D62 Acute posthemorrhagic anemia: Secondary | ICD-10-CM

## 2015-12-17 DIAGNOSIS — I82492 Acute embolism and thrombosis of other specified deep vein of left lower extremity: Secondary | ICD-10-CM

## 2015-12-17 NOTE — Progress Notes (Signed)
Patient ID: Cynthia [name], female   DOB: [birth year], 80 y.o.   MRN: [id number]    DATE:    12/17/15  MRN:  [id number]  BIRTHDAY: 1936-07-27  Facility:  [facility] Home Location:  [place] Room Number: 207-P  LEVEL OF CARE:  SNF (31)  Contact Information    Name Relation Home Work Mobile   [place] Daughter [phone]  [phone]   Gary Fleet [phone]         Code Status History    Date Active Date Inactive Code Status Order ID Comments User Context   10/27/2015  4:12 PM 12/01/2015  2:10 PM Full Code [id number]  Bary Leriche, PA-C Inpatient   10/27/2015  4:12 PM 10/27/2015  4:12 PM Full Code [id number]  Bary Leriche, PA-C Inpatient   10/12/2015 11:28 AM 10/27/2015  4:11 PM Full Code [id number]  Chesley Mires, MD Inpatient   10/06/2015  6:24 PM 10/12/2015 11:28 AM Partial Code [id number]  Willia Craze, NP Inpatient   01/11/2015  6:44 PM 01/13/2015  4:59 PM Partial Code [id number]  Aquilla Hacker, MD Inpatient   10/22/2013 12:53 PM 10/27/2013  1:07 PM Full Code [id number]  Fanny Skates, MD Inpatient   09/03/2013  3:50 PM 09/05/2013  4:34 PM Full Code [id number]  Louellen Molder, MD Inpatient       Chief Complaint  Patient presents with  . Discharge Note    HISTORY OF PRESENT ILLNESS:  This is a 80 year old female who for discharge home with Home health PT, OT, CNA and Nursing. DME:  Standard wheelchair with leg rests, cushion and anti-tippers.    She has been admitted to [facility] on 12/01/15 from [facility]. She has PMH of hypertension, stroke, right leg DVT and colon cancer. She had inpatient rehabilitation.She was hospitalized due to quadriplegia and quadriparesis from [place] syndrome. She required intubation for respiratory failure and was treated with antibiotics for HCAP and UTI. She had hypotension and coffee ground emesis thought to be from esophagitis and gastritis. EGD on March 1 showed proximal  linear ulcer. She was transferred to inpatient rehabilitation. She was treated for UTI there as well. She required foley catheter for urinary retention and then in and out cath with bladder training and flomax and urecholine. Now she is off urecholine. She had intensive PT, OT and nursing rehab. She was on dysphagia diet. She was also started on anticoagulation for new left leg DVT. She was placed on gabapentin and tramadol for neuropathic pain. She has history of metastatic colon cancer s/p surgery/chemotherapy and thermal ablation for liver metastatic lesion on 09/25/2015  Patient was admitted to this facility for short-term rehabilitation after the patient's recent hospitalization.  Patient has completed SNF rehabilitation and therapy has cleared the patient for discharge.   PAST MEDICAL HISTORY:  Past Medical History  Diagnosis Date  . Anemia   . Blood transfusion without reported diagnosis jan 2015  . Heart murmur   . Hypertension     no bp meds   . Liver cyst   . C. difficile colitis 03/05/14, 03/22/14, 04/05/14    recurrent  . Stroke ([place]) 12/2014    NUMBNESS ON LEFT SIDE  . Cervical cancer ([place]) 1972  . Colon cancer ([place]) JAN 2015  . Colon cancer ([place])   . Cancer ([place])     liver  . Complication of anesthesia     slow to awaken in past, DONE WELL RECENTLY  .  Tinnitus of both ears ALL THE TIME  . DVT (deep venous thrombosis) ([place]) 10/24/13    Right leg     CURRENT MEDICATIONS: Reviewed  Patient's Medications  New Prescriptions   No medications on file  Previous Medications   ACETAMINOPHEN (TYLENOL) 500 MG TABLET    Take 500 mg by mouth every 6 (six) hours as needed for mild pain.   ATORVASTATIN (LIPITOR) 40 MG TABLET    Take 1 tablet (40 mg total) by mouth daily.   GABAPENTIN (NEURONTIN) 400 MG CAPSULE    Take 1 capsule (400 mg total) by mouth 3 (three) times daily.   LIDOCAINE (XYLOCAINE) 5 % OINTMENT    Apply 1 application topically 2 (two) times daily as needed. Apply to  bilateral hands BID PRN for neuropathic symptoms   METOPROLOL SUCCINATE (TOPROL-XL) 25 MG 24 HR TABLET    Take 37.5 mg by mouth every evening.    MULTIPLE VITAMIN (MULTIVITAMIN WITH MINERALS) TABS TABLET    Take 1 tablet by mouth every morning.    PANTOPRAZOLE (PROTONIX) 40 MG TABLET    Take 1 tablet (40 mg total) by mouth daily.   POLYETHYLENE GLYCOL (MIRALAX / GLYCOLAX) PACKET    Take 17 g by mouth 2 (two) times daily.   PROTEIN (PROCEL) POWD    Take 2 scoop by mouth 2 (two) times daily.   QUETIAPINE (SEROQUEL) 25 MG TABLET    Take 0.5 tablets (12.5 mg total) by mouth at bedtime.   RIVAROXABAN (XARELTO) 15 MG TABS TABLET    Take 1 tablet (15 mg total) by mouth 2 (two) times daily with a meal. Till 12/19/15 am   RIVAROXABAN (XARELTO) 20 MG TABS TABLET    Take 1 tablet (20 mg total) by mouth daily with supper. Starting 12/19/15 pm   TAMSULOSIN (FLOMAX) 0.4 MG CAPS CAPSULE    Take 1 capsule (0.4 mg total) by mouth daily after supper.   UNABLE TO FIND    Med Name: Med pass 120 mL 2 times a day  Modified Medications   No medications on file  Discontinued Medications   No medications on file     No Known Allergies   REVIEW OF SYSTEMS:  GENERAL: no change in appetite, no fatigue, no weight changes, no fever, chills or weakness EYES: Denies change in vision, dry eyes, eye pain, itching or discharge EARS: Denies change in hearing, ringing in ears, or earache NOSE: Denies nasal congestion or epistaxis MOUTH and THROAT: Denies oral discomfort, gingival pain or bleeding, pain from teeth or hoarseness   RESPIRATORY: no cough, SOB, DOE, wheezing, hemoptysis CARDIAC: no chest pain, edema or palpitations GI: no abdominal pain, diarrhea, constipation, heart burn, nausea or vomiting GU: Denies dysuria, frequency, hematuria, incontinence, or discharge PSYCHIATRIC: Denies feeling of depression or anxiety. No report of hallucinations, insomnia, paranoia, or agitation   PHYSICAL EXAMINATION  GENERAL  APPEARANCE: Well nourished. In no acute distress. Normal body habitus HEAD: Normal in size and contour. No evidence of trauma EYES: Lids open and close normally. No blepharitis, entropion or ectropion. PERRL. Conjunctivae are clear and sclerae are white. Lenses are without opacity EARS: Pinnae are normal. Patient hears normal voice tunes of the examiner MOUTH and THROAT: Lips are without lesions. Oral mucosa is moist and without lesions. Tongue is normal in shape, size, and color and without lesions NECK: supple, trachea midline, no neck masses, no thyroid tenderness, no thyromegaly LYMPHATICS: no LAN in the neck, no supraclavicular LAN RESPIRATORY: breathing is even &  unlabored, BS CTAB CARDIAC: RRR, no murmur,no extra heart sounds, no edema GI: abdomen soft, normal BS, no masses, no tenderness, no hepatomegaly, no splenomegaly EXTREMITIES:  Able to move X 4 extremities with generalized weakness PSYCHIATRIC: Alert and oriented X 3. Affect and behavior are appropriate  LABS/RADIOLOGY: Labs reviewed: Basic Metabolic Panel:  Recent Labs  01/11/15 1920  10/13/15 0415  10/14/15 0456  10/19/15 0446  11/13/15 0520 11/19/15 0511 11/25/15 11/25/15 0742 12/07/15  NA  --   < > 141  < >  --   < > 141  < > 138 139 140 140 144  K  --   < > 2.9*  < >  --   < > 4.0  < > 4.1 4.4  --  4.3 4.5  CL  --   < > 104  < >  --   < > 104  < > 104 107  --  108  --   CO2  --   < > 26  < >  --   < > 32  < > 27 24  --  23  --   GLUCOSE  --   < > 111*  < >  --   < > 114*  < > 96 90  --  90  --   BUN  --   < > 14  < >  --   < > 19  < > 10 12 9 9 10   CREATININE  --   < > 0.66  < >  --   < > 0.54  < > 0.51 0.62 0.5 0.52 0.6  CALCIUM  --   < > 7.6*  < >  --   < > 8.1*  < > 8.8* 9.0  --  9.0  --   MG 1.8  < > 1.8  --  2.0  --  1.7  --   --   --   --   --   --   PHOS 4.2  --   --   --   --   --  3.5  --   --   --   --   --   --   < > = values in this interval not displayed. Liver Function Tests:  Recent Labs   10/20/15 0500 10/25/15 0801 10/28/15 0652 12/07/15  AST 44* 32 23 12*  ALT 29 33 20 7  ALKPHOS 40 46 42 49  BILITOT 0.4 1.0 0.9  --   PROT 5.9* 6.6 6.1*  --   ALBUMIN 1.7* 2.2* 2.0*  --      Recent Labs  01/11/15 1920  AMMONIA 18   CBC:  Recent Labs  10/20/15 0500  10/28/15 0652  11/25/15 0742 11/30/15 0441 12/01/15 12/01/15 0556 12/07/15  WBC 9.3  < > 7.0  < > 4.2 4.6 4.3 4.3 4.8  4.8  NEUTROABS 7.5  --  5.4  --   --   --   --   --  3  HGB 8.9*  < > 10.7*  < > 10.9* 10.1*  --  10.6* 11.0*  HCT 28.3*  < > 32.8*  < > 32.7* 32.6*  --  34.2* 35*  MCV 90.1  < > 91.4  < > 90.6 91.6  --  91.9  --   PLT 244  < > 405*  < > 243 233  --  241 259  < > = values in this interval  not displayed.  Lipid Panel:  Recent Labs  01/12/15 1400 10/07/15 0555  HDL 69 39*   Cardiac Enzymes:  Recent Labs  10/07/15 1041 10/11/15 0930 10/11/15 1738 10/11/15 2214  CKTOTAL 81  --   --   --   TROPONINI  --  0.17* 0.22* 0.20*   CBG:  Recent Labs  10/26/15 2101 10/27/15 0740 10/27/15 1126  GLUCAP 93 99 102*     ASSESSMENT/PLAN:  Generalized weakness - for home health PT, OT, CNA and Nursing  Gastric ulcer -  continue protonix 40 mg daily  Neuropathic pain - continue neurontin 400 mg tid   GBS - continue tylenol 500 mg q6h prn pain with lidocaine gel as needed; for Home health PT, OT, CNA and Nursing   Left leg DVT -  continue xarelto  20 mg daily.  Neurogenic bladder - continue  flomax, stable  Depression and anxiety - continue seroquel 12.5 mg daily  Hypertension - continue metoprolol 37.5 mg daily  Hyperlipidemia - continue atorvastatin 40 mg 1 tab by mouth daily   Colon cancer with liver metastases - continue miralax bid to help prevent constipation, f/u with oncology  Old CVA  -  continue atorvastatin and metoprolol   Protein-calorie malnutrition, severe - albumin 2.74; continue Procell 2 scoops PO BID  Anemia, acute blood loss - hgb 11.0; follow-up  with PCP      I have filled out patient's discharge paperwork and written prescriptions.  Patient will receive home health PT, OT, Nursing and CNA.  DME provided:  Standard wheelchair with leg rests, cushion and anti-tippers  Total discharge time: Greater than 30 minutes  Discharge time involved coordination of the discharge process with social worker, nursing staff and therapy department. Medical justification for home health services/DME verified.    [facility], NP [employer] [phone]

## 2015-12-23 ENCOUNTER — Encounter: Payer: Self-pay | Admitting: Physical Medicine & Rehabilitation

## 2015-12-23 ENCOUNTER — Encounter: Payer: Medicare Other | Attending: Physical Medicine & Rehabilitation | Admitting: Physical Medicine & Rehabilitation

## 2015-12-23 ENCOUNTER — Ambulatory Visit: Payer: Self-pay | Admitting: Neurology

## 2015-12-23 VITALS — BP 134/89 | HR 88 | Resp 16

## 2015-12-23 DIAGNOSIS — G610 Guillain-Barre syndrome: Secondary | ICD-10-CM | POA: Insufficient documentation

## 2015-12-23 DIAGNOSIS — D62 Acute posthemorrhagic anemia: Secondary | ICD-10-CM | POA: Diagnosis not present

## 2015-12-23 DIAGNOSIS — G8250 Quadriplegia, unspecified: Secondary | ICD-10-CM | POA: Diagnosis not present

## 2015-12-23 DIAGNOSIS — C189 Malignant neoplasm of colon, unspecified: Secondary | ICD-10-CM | POA: Diagnosis not present

## 2015-12-23 DIAGNOSIS — I10 Essential (primary) hypertension: Secondary | ICD-10-CM | POA: Diagnosis not present

## 2015-12-23 DIAGNOSIS — N319 Neuromuscular dysfunction of bladder, unspecified: Secondary | ICD-10-CM | POA: Insufficient documentation

## 2015-12-23 DIAGNOSIS — Z90710 Acquired absence of both cervix and uterus: Secondary | ICD-10-CM | POA: Diagnosis not present

## 2015-12-23 DIAGNOSIS — R011 Cardiac murmur, unspecified: Secondary | ICD-10-CM | POA: Insufficient documentation

## 2015-12-23 DIAGNOSIS — R202 Paresthesia of skin: Secondary | ICD-10-CM | POA: Insufficient documentation

## 2015-12-23 DIAGNOSIS — R269 Unspecified abnormalities of gait and mobility: Secondary | ICD-10-CM | POA: Diagnosis not present

## 2015-12-23 DIAGNOSIS — Z86718 Personal history of other venous thrombosis and embolism: Secondary | ICD-10-CM | POA: Insufficient documentation

## 2015-12-23 DIAGNOSIS — Z79899 Other long term (current) drug therapy: Secondary | ICD-10-CM | POA: Diagnosis not present

## 2015-12-23 DIAGNOSIS — I824Z9 Acute embolism and thrombosis of unspecified deep veins of unspecified distal lower extremity: Secondary | ICD-10-CM | POA: Diagnosis not present

## 2015-12-23 DIAGNOSIS — Z8673 Personal history of transient ischemic attack (TIA), and cerebral infarction without residual deficits: Secondary | ICD-10-CM | POA: Diagnosis not present

## 2015-12-23 DIAGNOSIS — Z8541 Personal history of malignant neoplasm of cervix uteri: Secondary | ICD-10-CM | POA: Diagnosis not present

## 2015-12-23 DIAGNOSIS — Z85038 Personal history of other malignant neoplasm of large intestine: Secondary | ICD-10-CM | POA: Insufficient documentation

## 2015-12-23 DIAGNOSIS — Z993 Dependence on wheelchair: Secondary | ICD-10-CM | POA: Insufficient documentation

## 2015-12-23 DIAGNOSIS — C787 Secondary malignant neoplasm of liver and intrahepatic bile duct: Secondary | ICD-10-CM | POA: Diagnosis not present

## 2015-12-23 NOTE — Patient Instructions (Signed)
  PLEASE CALL ME WITH ANY PROBLEMS OR QUESTIONS (#[phone]).      

## 2015-12-23 NOTE — Progress Notes (Signed)
Subjective:    Patient ID: Cynthia [name], female    DOB: [birth year], 80 y.o.   MRN: [id number]  HPI   Cynthia [name] is here in follow up of her GBS and paraparesis. She is getting stronger with transfers. She is walking more at home. She is using weights, etc. She still has tingling in her hands and feet. She seems to be tolerating the symptoms better.   She left the SNF Friday but was not set up with any therapy. Her daughter brought her over today and is assisting her at home.   Her bowels and bladder are working without issues. She has a good appetite. Her skin is intact.   The patient was sent home without xarelto and follow up dopplers were not ordered.   Pain Inventory Average Pain 10 Pain Right Now 6 My pain is sharp, burning and tingling  In the last 24 hours, has pain interfered with the following? General activity 5 Relation with others 0 Enjoyment of life 2 What TIME of day is your pain at its worst? Same at all times Sleep (in general) NA  Pain is worse with: some activites Pain improves with: NA Relief from Meds: 5  Mobility walk with assistance use a walker how many minutes can you walk? 3 ability to climb steps?  no do you drive?  no use a wheelchair needs help with transfers Do you have any goals in this area?  yes  Function not employed: date last employed 08-14-2005 I need assistance with the following:  dressing, bathing, toileting, meal prep and household duties  Neuro/Psych numbness tingling trouble walking  Prior Studies Any changes since last visit?  no  Physicians involved in your care Any changes since last visit?  no   Family History  Problem Relation Age of Onset  . Diabetes Sister   . Cancer Sister     leukemia  . Diabetes Brother   . Heart failure Brother   . Hypertension Brother   . Breast cancer Maternal Aunt   . Rheum arthritis Mother   . Heart failure Father   . Cancer Maternal Grandmother     GIST  .  Heart failure Maternal Grandfather   . Aneurysm Paternal Grandmother    Social History   Social History  . Marital Status: Widowed    Spouse Name: N/A  . Number of Children: N/A  . Years of Education: N/A   Social History Main Topics  . Smoking status: Never Smoker   . Smokeless tobacco: Never Used  . Alcohol Use: No  . Drug Use: No  . Sexual Activity: No   Other Topics Concern  . None   Social History Narrative   Past Surgical History  Procedure Laterality Date  . Back surgery  1981    lower lumbar  . Tubal ligation  1968  . Laparoscopic right colectomy Right 10/22/2013    Procedure: LAPAROSCOPIC RIGHT COLECTOMY ;  Surgeon: [name], MD;  Location: [facility]S;  Service: General;  Laterality: Right;  . Salpingoophorectomy  10/22/2013    Procedure: [place] Saa;  Surgeon: [name]. [name], MD;  Location: [facility];  Service: Gynecology;;  . Abdominal hysterectomy      vaginal- partial  . Esophagogastroduodenoscopy N/A 10/14/2015    Procedure: ESOPHAGOGASTRODUODENOSCOPY (EGD);  Surgeon: Clarene Essex, MD;  Location: [facility];  Service: Endoscopy;  Laterality: N/A;  Bedside in ICU   Past Medical History  Diagnosis Date  . Anemia   .  Blood transfusion without reported diagnosis jan 2015  . Heart murmur   . Hypertension     no bp meds   . Liver cyst   . C. difficile colitis 03/05/14, 03/22/14, 04/05/14    recurrent  . Stroke ([place]) 12/2014    NUMBNESS ON LEFT SIDE  . Cervical cancer ([place]) 1972  . Colon cancer ([place]) JAN 2015  . Colon cancer ([place])   . Cancer ([place])     liver  . Complication of anesthesia     slow to awaken in past, DONE WELL RECENTLY  . Tinnitus of both ears ALL THE TIME  . DVT (deep venous thrombosis) ([facility]) 10/24/13    Right leg   BP 134/89 mmHg  Pulse 88  Resp 16  SpO2 96%  Opioid Risk Score:   Fall Risk Score:  `1  Depression screen PHQ 2/9  Depression screen PHQ 2/9 12/23/2015  Decreased Interest 0  Down, Depressed, Hopeless  0  PHQ - 2 Score 0      Review of Systems  Neurological: Positive for numbness.       Tingling Gait Instability  All other systems reviewed and are negative.      Objective:   Physical Exam  HEENT: Normocephalic. Atraumatic. Cardio: RRR Resp: CTA B/L GI: BS positive and NT, ND Skin: Intact. Warm and dry. Neuro: Alert/Oriented Motor 5/5 B/l UE now---B/l LE 4-/5 hip flexion, 4/5 knee extension, Right ADF 0 to trace, right APF 3 to 3+, Left ADF tr-1 and left APF 4-/5. Decreased LT and PP in stocking glove distribution bilateral arms/hand and legs/feet.  Musc/Skel: No tenderness. No edema Gen: NAD. Vital signs reviewed. Psych: Mood, affect and behavior appropriate. In good spirits today!      Assessment & Plan:  1. Quadriplegia, numbness, gait instability secondary to GBS -now at home and has made nice progress!!  -is ready for outpt PT to address mobility, orthotics, safety, lower extremity strength. A referral was made to neuro rehab. Probably needs a right AFO 2. DVT Prophylaxis/Anticoagulation: spent extensive time discussing with patient this morning -xarelto stopped upon dc from SNF but no dopplers were re-ordered -repeat dopplers this week. If legs are clear, and she remains active with ambulation, we could move forward without anticoagulation at this point.  3. Pain Management: Tylenol prn.  -gabapentin 400mg  tid with good results 4. Neurogenic bladder---emptying well  5. Colon cancer with liver mets: No complaints of pain.  6 UGIB with ABLA: H/H stable  -hgb 11.0 per SNF.   -follow up per routine later this year.  Thirty minutes of face to face patient care time were spent during this visit. All questions were encouraged and answered. Follow up with me in about 6 weeks

## 2016-01-01 ENCOUNTER — Ambulatory Visit: Payer: Self-pay | Admitting: Oncology

## 2016-01-01 ENCOUNTER — Other Ambulatory Visit: Payer: Self-pay

## 2016-01-06 ENCOUNTER — Ambulatory Visit: Payer: Medicare Other | Attending: Physical Medicine & Rehabilitation | Admitting: Physical Therapy

## 2016-01-06 DIAGNOSIS — R2681 Unsteadiness on feet: Secondary | ICD-10-CM | POA: Insufficient documentation

## 2016-01-06 DIAGNOSIS — M6281 Muscle weakness (generalized): Secondary | ICD-10-CM | POA: Diagnosis present

## 2016-01-06 DIAGNOSIS — R2689 Other abnormalities of gait and mobility: Secondary | ICD-10-CM

## 2016-01-07 ENCOUNTER — Telehealth: Payer: Self-pay | Admitting: Oncology

## 2016-01-07 ENCOUNTER — Ambulatory Visit (HOSPITAL_BASED_OUTPATIENT_CLINIC_OR_DEPARTMENT_OTHER): Payer: Medicare Other | Admitting: Oncology

## 2016-01-07 ENCOUNTER — Ambulatory Visit (HOSPITAL_BASED_OUTPATIENT_CLINIC_OR_DEPARTMENT_OTHER): Payer: Medicare Other

## 2016-01-07 VITALS — BP 152/80 | HR 82 | Temp 97.9°F | Resp 18 | Ht 62.0 in | Wt 133.8 lb

## 2016-01-07 DIAGNOSIS — Z8541 Personal history of malignant neoplasm of cervix uteri: Secondary | ICD-10-CM

## 2016-01-07 DIAGNOSIS — C787 Secondary malignant neoplasm of liver and intrahepatic bile duct: Secondary | ICD-10-CM

## 2016-01-07 DIAGNOSIS — C180 Malignant neoplasm of cecum: Secondary | ICD-10-CM

## 2016-01-07 DIAGNOSIS — C189 Malignant neoplasm of colon, unspecified: Secondary | ICD-10-CM

## 2016-01-07 DIAGNOSIS — Z86718 Personal history of other venous thrombosis and embolism: Secondary | ICD-10-CM

## 2016-01-07 DIAGNOSIS — I82401 Acute embolism and thrombosis of unspecified deep veins of right lower extremity: Secondary | ICD-10-CM | POA: Diagnosis not present

## 2016-01-07 NOTE — Telephone Encounter (Signed)
Gave adn printed appt sched and avs for pt for Sept °

## 2016-01-07 NOTE — Therapy (Signed)
[facility] [address] [place], [place], [address] Phone: [phone]   Fax:  [phone]  Physical Therapy Evaluation  Patient Details  Name: Cynthia [name] MRN: [id number] Date of Birth: [birth year] Referring Provider: Naaman Plummer  Encounter Date: 01/06/2016      PT End of Session - 01/07/16 1140    Visit Number 1   Number of Visits 25   Date for PT Re-Evaluation 03/06/16   Authorization Type UHC Medicare-G-code every 10th visit   PT Start Time 0802   PT Stop Time 0846   PT Time Calculation (min) 44 min   Equipment Utilized During Treatment Gait belt   Activity Tolerance Patient tolerated treatment well   Behavior During Therapy [facility] for tasks assessed/performed      Past Medical History  Diagnosis Date  . Anemia   . Blood transfusion without reported diagnosis jan 2015  . Heart murmur   . Hypertension     no bp meds   . Liver cyst   . C. difficile colitis 03/05/14, 03/22/14, 04/05/14    recurrent  . Stroke ([place]) 12/2014    NUMBNESS ON LEFT SIDE  . Cervical cancer ([place]) 1972  . Colon cancer ([place]) JAN 2015  . Colon cancer ([place])   . Cancer ([place])     liver  . Complication of anesthesia     slow to awaken in past, DONE WELL RECENTLY  . Tinnitus of both ears ALL THE TIME  . DVT (deep venous thrombosis) ([place]) 10/24/13    Right leg    Past Surgical History  Procedure Laterality Date  . Back surgery  1981    lower lumbar  . Tubal ligation  1968  . Laparoscopic right colectomy Right 10/22/2013    Procedure: LAPAROSCOPIC RIGHT COLECTOMY ;  Surgeon: [name], MD;  Location: [facility] ORS;  Service: General;  Laterality: Right;  . Salpingoophorectomy  10/22/2013    Procedure: Marquette Saa;  Surgeon: [name]. [name], MD;  Location: [facility];  Service: Gynecology;;  . Abdominal hysterectomy      vaginal- partial  . Esophagogastroduodenoscopy N/A 10/14/2015    Procedure: ESOPHAGOGASTRODUODENOSCOPY  (EGD);  Surgeon: Clarene Essex, MD;  Location: [facility] ENDOSCOPY;  Service: Endoscopy;  Laterality: N/A;  Bedside in ICU    There were no vitals filed for this visit.       Subjective Assessment - 01/06/16 0807    Subjective Pt is a 80 year old female who presents to OP PT status post GBS starting in February 2017.  She has had muscle weakness (noticing some return) as well as tingling in hands and feet.  Pt was hopsitalized and then went for rehab to [facility], where she was discharged 12/21/15. Trying to walk, eat, and other activities tire her out a lot.  Physical assistance is required to walk with a walker.   Patient is accompained by: Family member   Pertinent History GBS February 2017, history of DVT in L calf, CVA with L sided numbness (no reported weakness); history of cancer   Limitations Standing;Walking;House hold activities   Patient Stated Goals Pt's goal is to be able to walk without assistive device   Currently in Pain? No/denies            [facility] PT Assessment - 01/06/16 [id number]    Assessment   Medical Diagnosis GBS   Referring Provider Naaman Plummer   Onset Date/Surgical Date --  February 2017   Precautions   Precautions Fall;Other (comment)  Precaution Comments Requires supervision/assistance when up   Balance Screen   Has the patient fallen in the past 6 months No   Has the patient had a decrease in activity level because of a fear of falling?  No   Is the patient reluctant to leave their home because of a fear of falling?  No   Home Social worker Private residence   Living Arrangements Children   Available Help at Discharge Family   Type of [place] One level   [place] - 2 wheels;Wheelchair - Brewing technologist;Toilet riser;Grab bars - tub/shower;Other (comment)  cane with tripod base   Prior Function   Level of Independence Independent   Leisure Enjoyed walking in the park, household and  yard work/flower beds   Sensation   Light Touch Appears Intact  to light touch   Posture/Postural Control   Posture/Postural Control Postural limitations   Postural Limitations Rounded Shoulders;Forward head   ROM / Strength   AROM / PROM / Strength AROM;Strength;PROM   AROM   Overall AROM Comments Ankle dorsiflexion neutral on LLE, -15 degrees on RLE   PROM   Overall PROM Comments P/ROM R ankle dorsiflexion neutral   Strength   Overall Strength Deficits   Strength Assessment Site Hip;Knee;Ankle   Right/Left Hip Right;Left   Right Hip Flexion 3+/5   Right Hip Extension 2/5   Right Hip ABduction 2+/5   Left Hip Flexion 3+/5   Left Hip Extension 2/5   Left Hip External Rotation 2/5   Left Hip ABduction 2+/5   Right/Left Knee Right;Left   Right Knee Extension 4/5   Left Knee Extension 4+/5   Right/Left Ankle Right;Left   Right Ankle Dorsiflexion 3-/5   Right Ankle Inversion 3/5   Right Ankle Eversion 3/5   Left Ankle Dorsiflexion 3-/5   Left Ankle Inversion 3+/5   Left Ankle Eversion 3+/5   Transfers   Transfers Sit to Stand;Stand to Sit;Stand Pivot Transfers   Sit to Stand 5: Supervision;From chair/3-in-1  Holds to walker to stand   Sit to Stand Details (indicate cue type and reason) PT does provide cues for hand placement with transfers.   Stand to Sit 5: Supervision;To chair/3-in-1  Holds to walker to sit   Stand Pivot Transfers 6: Modified independent (Device/Increase time)   Ambulation/Gait   Ambulation/Gait Yes   Ambulation/Gait Assistance 5: Supervision   Ambulation Distance (Feet) 80 Feet   Assistive device Rolling walker   Gait Pattern Step-through pattern;Decreased step length - right;Decreased step length - left;Decreased dorsiflexion - right;Poor foot clearance - right   Ambulation Surface Level;Indoor   Gait velocity 45.76= 0.72 ft/sec   Gait Comments Gait velocity <1.8 ft/sec indicates increased fall risk.   Standardized Balance Assessment   Standardized  Balance Assessment Timed Up and Go Test   Timed Up and Go Test   Normal TUG (seconds) 63.83   TUG Comments Scores >30 seconds indicate increased difficulty with ADLs in the home; >13.5 sec indicate increased fall risk.                             PT Short Term Goals - 01/07/16 1151    PT SHORT TERM GOAL #1   Title Pt will perform HEP with family supervision for improved strength, ROM, balance and gait.  TARGET 02/05/16   Time 4   Period [place]  Status New   PT SHORT TERM GOAL #2   Title Pt will improve TUG score to less than or equal to 50 seconds for decreased fall risk.   Time 4   Period Weeks   Status New   PT SHORT TERM GOAL #3   Title Berg Balance test to be assessed, with Berg score improving by at least 5 points.   Time 4   Period Weeks   Status New   PT SHORT TERM GOAL #4   Title Pt will improve gait velocity to at least 1 ft/sec for improved gait efficiency and safety.   Time 4   Period Weeks   Status New   PT SHORT TERM GOAL #5   Title Pt will perform sit<>stand transfers, modified independently, for improved transfer safety and efficiency.   Time 4   Period Weeks           PT Long Term Goals - 01/07/16 1157    PT LONG TERM GOAL #1   Title Pt will verbalize understanding of fall prevention within the home environment.  TARGET 03/06/16   Time 8   Period Weeks   Status New   PT LONG TERM GOAL #2   Title Pt will improve TUG score to less than or equal to 40 seconds for decreased fall risk.   Time 8   Period Weeks   Status New   PT LONG TERM GOAL #3   Title Pt will improve Berg Balance score by at least 15 points for decreased fall risk.   Time 8   Period Weeks   Status New   PT LONG TERM GOAL #4   Title Pt will improve gait velocity to 1.5 ft/sec for improved gait efficiency and safety.   Time 8   Period Weeks   Status New               Plan - 01/07/16 1141    Clinical Impression Statement Pt is a 80 year old female who  presents to OP PT status post hospitalization/rehab center for Guillain-Barre Syndrome in February 2017.  Pt was discharge home in early May 2017.  Prior to hospitalization, pt was independent.  Pt presents with decreased muscle strength, decreased balance, decreased independence with transfers, decreased independence with gait, decreased ROM, decreased ability to participate in functional mobility independently within the home.  Pt presents as high fall risk per TUG and  gait velocity scores.  Pt demonstrates decreased ability to sustain muscle contractions throughout the ROM against gravity in hip musculature.  Pt would benefit from skilled physical therapy to address the above stated deficits for improved functional mobility and decreased risk of falls.     Rehab Potential Good   PT Frequency 3x / week   PT Duration 8 weeks  plus eval   PT Treatment/Interventions ADLs/Self Care Home Management;Therapeutic exercise;Therapeutic activities;Functional mobility training;Gait training;DME Instruction;Balance training;Neuromuscular re-education;Patient/family education;Orthotic Fit/Training   PT Next Visit Plan Initiate HEP for hip strengthening (check MMT, as some ex need to be anti-gravity); PLEASE CHECK BERG, gait training   Consulted and Agree with Plan of Care Patient;Family member/caregiver   Family Member Consulted daughter      Patient will benefit from skilled therapeutic intervention in order to improve the following deficits and impairments:  Abnormal gait, Decreased activity tolerance, Decreased balance, Decreased mobility, Decreased endurance, Decreased range of motion, Decreased safety awareness, Decreased strength, Difficulty walking, Impaired sensation, Pain  Visit Diagnosis: Other abnormalities of gait and mobility  Muscle weakness (generalized)  Unsteadiness on feet      G-Codes - 13-Jan-2016 1227    Functional Assessment Tool Used gait velocity 0.72 ft/sec, TUG 63.83 sec    Functional Limitation Mobility: Walking and moving around   Mobility: Walking and Moving Around Current Status [phone]) At least 60 percent but less than 80 percent impaired, limited or restricted   Mobility: Walking and Moving Around Goal Status [phone]) At least 20 percent but less than 40 percent impaired, limited or restricted       Problem List Patient Active Problem List   Diagnosis Date Noted  . Poor appetite   . Neurogenic bladder   . Adjustment disorder with mixed anxiety and depressed mood   . Quadriplegia and quadriparesis ([place])   . Numbness and tingling   . Paresthesias   . UTI (lower urinary tract infection)   . History of colon cancer   . Acute blood loss anemia   . Upper GI bleed   . GBS (Guillain-Barre syndrome) ([place])   . Aspiration into airway   . Endotracheally intubated   . Acute respiratory failure ([place])   . Shock ([place]) 10/09/2015  . Hemoptysis   . Transverse myelitis ([place])   . Peripheral neuropathy ([place]) 10/06/2015  . Paresthesia of both hands 10/06/2015  . Paresthesia of both feet 10/06/2015  . Cerebral embolism with cerebral infarction 10/06/2015  . Ataxia   . Colon carcinoma metastatic to liver ([place]) 09/25/2015  . Metastasis to liver ([place]) 08/28/2015  . Colon cancer metastasized to liver ([place])   . Liver lesion   . Cerebral infarction due to unspecified mechanism 01/29/2015  . B12 deficiency 01/29/2015  . Essential hypertension 01/29/2015  . Arm numbness left   . Disorientation   . Numbness and tingling of left side of face   . TIA (transient ischemic attack) 01/11/2015  . Confusion   . Left arm numbness   . DVT, lower extremity, distal ([place]) 10/27/2013  . T4a, N0 09/27/2013  . Iron deficiency anemia 09/04/2013  . Symptomatic anemia 09/03/2013  . Orthostasis 09/03/2013  . Near syncope 09/03/2013    Montoya Watkin W. 01-13-16, 12:28 PM  Frazier Butt., PT  [employer] [address]  [place], [place], [address] Phone: [phone]   Fax:  [phone]  Name: Cynthia [name] MRN: [id number] Date of Birth: [birth year]

## 2016-01-07 NOTE — Progress Notes (Signed)
[facility] OFFICE PROGRESS NOTE   Diagnosis: Colon cancer  INTERVAL HISTORY:   Cynthia [name] underwent ablation of 2 liver lesions on 09/25/2015. She was admitted 10/06/2015 with weakness and respiratory failure. She was diagnosed with Guillain-Barr syndrome. She had a lengthy hospital admission including intubation. She was discharged to the rehabilitation service. She was diagnosed with DVTs of the left lower extremity while on the rehabilitation service. She was discharged to a skilled nursing facility on 12/01/2015.  She is now at home and continues outpatient physical therapy. Ms. Cortinas and her daughter report her strength is improving. She is ambulating with a walker and wheelchair.  She has not taken Xarelto for the past 3 weeks secondary to a "mixup "with insurance and the pharmacy.  Ms. Etzkorn has not seen Dr. Kathlene Cote in follow-up. Objective:  Vital signs in last 24 hours:  Blood pressure 152/80, pulse 82, temperature 97.9 F (36.6 C), temperature source Oral, resp. rate 18, height 5' 2"  (1.575 m), weight 133 lb 12.8 oz (60.691 kg), SpO2 98 %.    HEENT: Neck without mass Lymphatics: No cervical, supraclavicular, axillary, or inguinal nodes Resp: Lungs clear bilaterally Cardio: Regular rate and rhythm GI: No hepatosplenomegaly, no mass, nontender Vascular: No leg edema Neuro: The motor exam appears intact in the upper extremities bilaterally, 4/5 leg strength bilaterally, right greater than left weakness with dorsi flexion at the foot      Lab Results:  CEA pending    Medications: I have reviewed the patient's current medications.  Assessment/Plan: 1. Moderately differentiated adenocarcinoma of the cecum, stage III (T4a, N1), status post a laparoscopic assisted right colectomy 10/22/2013  The tumor returned microsatellite stable with normal mismatch repair protein expression   Cycle 1 adjuvant Xeloda 12/25/2013.   Cycle 2 adjuvant  Xeloda 01/15/2014.   Cycle 3 adjuvant Xeloda 02/05/2014.   Cycle 4 adjuvant Xeloda 03/18/2014, discontinued after 4 days secondary to diarrhea   Cycle 5 adjuvant Xeloda 04/16/2014, Xeloda dose reduced to 1000 mg in the a.m. and 500 mg in the p.m.   Cycle 6 adjuvant Xeloda 05/07/2014.  Cycle 7 adjuvant Xeloda 05/28/2014.  Cycle 8 adjuvant Xeloda 06/18/2014.  Restaging CTs on 07/14/2014 with no evidence of metastatic colon cancer  Restaging CTs 07/16/2015 with 2 new hypodense liver lesions concerning for metastases, no other evidence of disease progression  PET scan 07/28/2015 with a single hypermetabolic right liver lesion, no other evidence of metastatic disease  Ultrasound-guided biopsy of the right liver lesion 07/30/2015 confirmed metastatic colon cancer  MRI abdomen 08/20/2015 with similar to slight decrease in size of 2 hepatic metastasis. No new liver lesions or extrahepatic metastatic disease identified.  Radiofrequency ablation of 2 liver lesions 09/25/2015 2. Right ovary cystadenofibroma, status post a bilateral oophorectomy 10/22/2013 3. Remote history of cervical cancer. 4. Iron deficiency anemia-resolved. 5. Right calf deep vein thrombosis 10/24/2013-she completed 3 months of xarelto. 6. Indeterminate 12 mm hypermetabolic right pelvic density on the staging PET scan 10/04/2013-CT followup recommended per the GI tumor conference 11/13/2013. No longer visualized on a CT 07/14/2014. 7. Soft tissue implant overlying the posterior right liver on the CT 41/74/0814-GYJ hypermetabolic on the PET scan 85/63/1497. 8. C. difficile colitis 03/05/2014. She completed a course of Flagyl, recurrent diarrhea beginning 03/21/2014, positive C. difficile toxin 03/26/2014-resumed Flagyl for planned 10 day course.  Vancomycin beginning 04/08/2014.  Taper complete 05/21/2014.  9. CVA May 2016 10. Skin infection at the right first MTP joint- Keflex prescribed 08/06/2015 11.   Guillan-Barre  syndrome February 2017 12.  Left lower extremity DVTs March 2017-treated with Xarelto   Disposition:  Ms. Simko is in clinical remission from colon cancer after undergoing ablation of 2 liver lesions 09/25/2015. We checked the CEA today. I will contact Dr. Kathlene Cote to discuss surveillance imaging.  Ms. Mcglinn will return for an office visit and CEA in 4 months.  She was diagnosed with left lower extremity DVTs while hospitalized in March. She will resume Xarelto anticoagulation. She will seek medical attention for bleeding.  I recommended she follow-up with her neurologist regarding the Guillan-barre syndrome and future vaccine treatment.  [name], MD  01/07/2016  1:10 PM

## 2016-01-08 ENCOUNTER — Other Ambulatory Visit: Payer: Self-pay

## 2016-01-08 ENCOUNTER — Telehealth: Payer: Self-pay | Admitting: *Deleted

## 2016-01-08 LAB — CEA: CEA1: 2.3 ng/mL (ref 0.0–4.7)

## 2016-01-08 LAB — CEA (PARALLEL TESTING): CEA: 0.9 ng/mL

## 2016-01-08 NOTE — Telephone Encounter (Signed)
Per Dr. Benay Spice, pt notified that CEA is normal.  Pt has no questions or concerns at this time and is appreciative of call.

## 2016-01-08 NOTE — Telephone Encounter (Signed)
-----   Message from Ladell Pier, MD sent at 01/08/2016  3:38 PM EDT ----- Please call patient, cea is normal

## 2016-01-13 ENCOUNTER — Other Ambulatory Visit: Payer: Self-pay | Admitting: Oncology

## 2016-01-13 ENCOUNTER — Other Ambulatory Visit: Payer: Self-pay | Admitting: *Deleted

## 2016-01-13 DIAGNOSIS — C787 Secondary malignant neoplasm of liver and intrahepatic bile duct: Principal | ICD-10-CM

## 2016-01-13 DIAGNOSIS — C189 Malignant neoplasm of colon, unspecified: Secondary | ICD-10-CM

## 2016-01-14 ENCOUNTER — Encounter: Payer: Self-pay | Admitting: *Deleted

## 2016-01-14 ENCOUNTER — Telehealth: Payer: Self-pay | Admitting: Oncology

## 2016-01-14 NOTE — Telephone Encounter (Signed)
per pof to sch Liver MRI-cld & spoke topt and gave pt [facility] Imaging #-pt stated shw will not do 2 MRI's-transferred to Dr Bernette Redbird nurse

## 2016-01-14 NOTE — Progress Notes (Signed)
Per interventional radiology MRI liver is already scheduled per Dr. Kathlene Cote on 02/02/16.  Pt notified and is aware of time.  Pt has no further questions or concerns at this time.

## 2016-01-15 ENCOUNTER — Telehealth: Payer: Self-pay | Admitting: *Deleted

## 2016-01-15 NOTE — Telephone Encounter (Signed)
Spoke with pt, she has been contacted by Dr. Margaretmary Dys office for lab and MRI. Scheduled for 6/20.

## 2016-01-18 ENCOUNTER — Encounter: Payer: Self-pay | Admitting: Rehabilitation

## 2016-01-18 ENCOUNTER — Ambulatory Visit: Payer: Medicare Other | Attending: Physical Medicine & Rehabilitation | Admitting: Rehabilitation

## 2016-01-18 DIAGNOSIS — M6281 Muscle weakness (generalized): Secondary | ICD-10-CM | POA: Insufficient documentation

## 2016-01-18 DIAGNOSIS — R2681 Unsteadiness on feet: Secondary | ICD-10-CM | POA: Insufficient documentation

## 2016-01-18 DIAGNOSIS — G8250 Quadriplegia, unspecified: Secondary | ICD-10-CM | POA: Diagnosis present

## 2016-01-18 DIAGNOSIS — G8220 Paraplegia, unspecified: Secondary | ICD-10-CM | POA: Diagnosis present

## 2016-01-18 DIAGNOSIS — R2689 Other abnormalities of gait and mobility: Secondary | ICD-10-CM

## 2016-01-18 DIAGNOSIS — G610 Guillain-Barre syndrome: Secondary | ICD-10-CM | POA: Diagnosis present

## 2016-01-18 NOTE — Patient Instructions (Signed)
Bracing With Bridging (Hook-Lying)    With neutral spine, tighten pelvic floor and abdominals and hold. Lift bottom. Repeat _10__ times. Do _1-2__ times a day.   Copyright  VHI. All rights reserved.   Bracing With Single Leg Bridging (Hook-Lying)    Lie with one hip and knee bent and spine neutral. Tighten pelvic floor and abdominals and hold. Lift same side of bottom. Repeat _10__ times on each leg. Do 1-2___ times a day.   Copyright  VHI. All rights reserved.   Abduction: Clam (Eccentric) - Side-Lying    Lie on side with knees bent. Lift top knee, keeping feet together. Keep trunk steady. Slowly lower for 3-5 seconds. _10__ reps per set, __1-2_ sets per day, _5-7__ days per week.   http://ecce.exer.us/65   Copyright  VHI. All rights reserved.   Functional Quadriceps: Sit to Stand    Sit on edge of chair (use w/c) and have counter top in front of you for support if you need it, feet flat on floor. Stand upright, extending knees fully. Repeat _10___ times per set. Do __1__ sets per session. Do __1-2__ sessions per day.  http://orth.exer.us/735   Copyright  VHI. All rights reserved.

## 2016-01-19 ENCOUNTER — Ambulatory Visit: Payer: Medicare Other | Admitting: Physical Therapy

## 2016-01-19 ENCOUNTER — Telehealth: Payer: Self-pay

## 2016-01-19 DIAGNOSIS — R2689 Other abnormalities of gait and mobility: Secondary | ICD-10-CM | POA: Diagnosis not present

## 2016-01-19 MED ORDER — GABAPENTIN 400 MG PO CAPS: 400.0000 mg | ORAL_CAPSULE | Freq: Three times a day (TID) | ORAL | Status: DC

## 2016-01-19 NOTE — Telephone Encounter (Signed)
Pt requested a refill on her Gabapentin. Refill sent to pharmacy. Pt aware.

## 2016-01-19 NOTE — Therapy (Signed)
[facility] [address] [place], [place], [address] Phone: [phone]   Fax:  [phone]  Physical Therapy Treatment  Patient Details  Name: Cynthia [name] MRN: [id number] Date of Birth: [birth year] Referring Provider: Naaman Plummer  Encounter Date: 01/18/2016      PT End of Session - 01/19/16 0927    Visit Number 2   Number of Visits 25   Date for PT Re-Evaluation 03/06/16   Authorization Type UHC Medicare-G-code every 10th visit   PT Start Time 1530   PT Stop Time 1616   PT Time Calculation (min) 46 min   Equipment Utilized During Treatment Gait belt   Activity Tolerance Patient tolerated treatment well   Behavior During Therapy [facility] for tasks assessed/performed      Past Medical History  Diagnosis Date  . Anemia   . Blood transfusion without reported diagnosis jan 2015  . Heart murmur   . Hypertension     no bp meds   . Liver cyst   . C. difficile colitis 03/05/14, 03/22/14, 04/05/14    recurrent  . Stroke ([place]) 12/2014    NUMBNESS ON LEFT SIDE  . Cervical cancer ([place]) 1972  . Colon cancer ([place]) JAN 2015  . Colon cancer ([place])   . Cancer ([place])     liver  . Complication of anesthesia     slow to awaken in past, DONE WELL RECENTLY  . Tinnitus of both ears ALL THE TIME  . DVT (deep venous thrombosis) ([place]) 10/24/13    Right leg    Past Surgical History  Procedure Laterality Date  . Back surgery  1981    lower lumbar  . Tubal ligation  1968  . Laparoscopic right colectomy Right 10/22/2013    Procedure: LAPAROSCOPIC RIGHT COLECTOMY ;  Surgeon: [name], MD;  Location: [facility];  Service: General;  Laterality: Right;  . Salpingoophorectomy  10/22/2013    Procedure: Marquette Saa;  Surgeon: [name]. [name], MD;  Location: [facility];  Service: Gynecology;;  . Abdominal hysterectomy      vaginal- partial  . Esophagogastroduodenoscopy N/A 10/14/2015    Procedure: ESOPHAGOGASTRODUODENOSCOPY  (EGD);  Surgeon: Clarene Essex, MD;  Location: [facility] END[facility];  Service: Endoscopy;  Laterality: N/A;  Bedside in ICU    There were no vitals filed for this visit.      Subjective Assessment - 01/18/16 1534    Subjective Pt reports no significant changes, reports some swelling in RLE over the weekend and wasn't able to go to church.     Patient is accompained by: Family member   Pertinent History GBS February 2017, history of DVT in L calf, CVA with L sided numbness (no reported weakness); history of cancer   Limitations Standing;Walking;House hold activities   Patient Stated Goals Pt's goal is to be able to walk without assistive device   Currently in Pain? No/denies                         [facility] Adult PT Treatment/Exercise - 01/18/16 1537    Standardized Balance Assessment   Standardized Balance Assessment Berg Balance Test   Berg Balance Test   Sit to Stand Able to stand  independently using hands   Standing Unsupported Unable to stand 30 seconds unassisted   Sitting with Back Unsupported but Feet Supported on Floor or Stool Able to sit safely and securely 2 minutes   Stand to Sit Controls descent by using  hands   Transfers Able to transfer with verbal cueing and /or supervision   Standing Unsupported with Eyes Closed Unable to keep eyes closed 3 seconds but stays steady   Standing Ubsupported with Feet Together Needs help to attain position and unable to hold for 15 seconds   From Standing, Reach Forward with Outstretched Arm Loses balance while trying/requires external support   From Standing Position, Pick up Object from Floor Unable to try/needs assist to keep balance   From Standing Position, Turn to Look Behind Over each Shoulder Needs assist to keep from losing balance and falling   Turn 360 Degrees Needs assistance while turning   Standing Unsupported, Alternately Place Feet on Step/Stool Able to complete >2 steps/needs minimal assist   Standing Unsupported, One  Foot in Front Needs help to step but can hold 15 seconds   Standing on One Leg Unable to try or needs assist to prevent fall   Total Score 15      NMR:  Performed BERG balance test with score of 15/56, significant for fall risk.  See details above.  Educated pt and daughter on meaning of results.    Self Care:  Had discussion with pt and daughter regarding having pt ambulate into clinic from now on to encourage increased strength, endurance and mobility.  Provided education on having daughter park closer initially and walk in with her then working towards parking in regular spot then walking from there.  Also encouraged pt to perform more ambulation at home as she states she is using the chair more at home.  Pt states that feet begin to have burning and tingling sensation that increases with increased gait, therefore recommend she speak with MD regarding this issue on next visit this month.  Both verbalized understanding.    Therex: Initiated HEP for BLE strength, see pt instruction for details on exercises and reps.  Performed gait x 345' during session to better assess endurance.  Pt able to ambulate at S level with RW with min cues for posture and decreasing reliance of UEs.  Also performed curb/ramp to better simulate home and community entry.  Performed at S to min/guard level with min cues for sequencing and technique.            PT Education - 01/19/16 [phone]    Education provided Yes   Education Details HEP, see pt instruction   Person(s) Educated Patient;Child(ren)   Methods Explanation;Demonstration;Handout   Comprehension Verbalized understanding;Returned demonstration          PT Short Term Goals - 01/19/16 0931    PT SHORT TERM GOAL #1   Title Pt will perform HEP with family supervision for improved strength, ROM, balance and gait.  TARGET 02/05/16   Time 4   Period Weeks   Status New   PT SHORT TERM GOAL #2   Title Pt will improve TUG score to less than or equal to 50  seconds for decreased fall risk.   Time 4   Period Weeks   Status New   PT SHORT TERM GOAL #3   Title Berg Balance test to be assessed, with Berg score improving by at least 5 points.   Baseline 15/56 on 01/18/16   Time 4   Period Weeks   Status New   PT SHORT TERM GOAL #4   Title Pt will improve gait velocity to at least 1 ft/sec for improved gait efficiency and safety.   Time 4   Period Weeks  Status New   PT SHORT TERM GOAL #5   Title Pt will perform sit<>stand transfers, modified independently, for improved transfer safety and efficiency.   Time 4   Period Weeks           PT Long Term Goals - 01/07/16 1157    PT LONG TERM GOAL #1   Title Pt will verbalize understanding of fall prevention within the home environment.  TARGET 03/06/16   Time 8   Period Weeks   Status New   PT LONG TERM GOAL #2   Title Pt will improve TUG score to less than or equal to 40 seconds for decreased fall risk.   Time 8   Period Weeks   Status New   PT LONG TERM GOAL #3   Title Pt will improve Berg Balance score by at least 15 points for decreased fall risk.   Time 8   Period Weeks   Status New   PT LONG TERM GOAL #4   Title Pt will improve gait velocity to 1.5 ft/sec for improved gait efficiency and safety.   Time 8   Period Weeks   Status New               Plan - 01/18/16 1537    Clinical Impression Statement Skilled session focused on initiation of HEP for BLE strengthening, gait for quality and endurance as well as negotiation of curb and ramp.  Performed BERG with score of 15/56 indicative of significant fall risk.  Encouraged pt to begin to ambulate into clinic with daughter and to increase ambulation at home.  She mentioned that B feet have increased tingling and burning following long bouts of gait, therefore encouraged pt to speak with MD regarding this matter at next visit tihs month.     Rehab Potential Good   PT Frequency 3x / week   PT Duration 8 weeks  plus eval    PT Treatment/Interventions ADLs/Self Care Home Management;Therapeutic exercise;Therapeutic activities;Functional mobility training;Gait training;DME Instruction;Balance training;Neuromuscular re-education;Patient/family education;Orthotic Fit/Training   PT Next Visit Plan Check compliance with HEP, add as needed (check hamstring strength, add standing exercises if able), gait training w/ LRAD (decreasing UE support.     Consulted and Agree with Plan of Care Patient;Family member/caregiver   Family Member Consulted daughter      Patient will benefit from skilled therapeutic intervention in order to improve the following deficits and impairments:  Abnormal gait, Decreased activity tolerance, Decreased balance, Decreased mobility, Decreased endurance, Decreased range of motion, Decreased safety awareness, Decreased strength, Difficulty walking, Impaired sensation, Pain  Visit Diagnosis: Other abnormalities of gait and mobility  Muscle weakness (generalized)  Unsteadiness on feet  Paraparesis of both lower limbs (HCC)     Problem List Patient Active Problem List   Diagnosis Date Noted  . Poor appetite   . Neurogenic bladder   . Adjustment disorder with mixed anxiety and depressed mood   . Quadriplegia and quadriparesis ([place])   . Numbness and tingling   . Paresthesias   . UTI (lower urinary tract infection)   . History of colon cancer   . Acute blood loss anemia   . Upper GI bleed   . GBS (Guillain-Barre syndrome) ([place])   . Aspiration into airway   . Endotracheally intubated   . Acute respiratory failure ([place])   . Shock ([place]) 10/09/2015  . Hemoptysis   . Transverse myelitis ([place])   . Peripheral neuropathy ([place]) 10/06/2015  . Paresthesia of both hands 10/06/2015  .  Paresthesia of both feet 10/06/2015  . Cerebral embolism with cerebral infarction 10/06/2015  . Ataxia   . Colon carcinoma metastatic to liver ([place]) 09/25/2015  . Metastasis to liver ([place]) 08/28/2015  . Colon  cancer metastasized to liver ([place])   . Liver lesion   . Cerebral infarction due to unspecified mechanism 01/29/2015  . B12 deficiency 01/29/2015  . Essential hypertension 01/29/2015  . Arm numbness left   . Disorientation   . Numbness and tingling of left side of face   . TIA (transient ischemic attack) 01/11/2015  . Confusion   . Left arm numbness   . DVT, lower extremity, distal ([place]) 10/27/2013  . T4a, N0 09/27/2013  . Iron deficiency anemia 09/04/2013  . Symptomatic anemia 09/03/2013  . Orthostasis 09/03/2013  . Near syncope 09/03/2013    Cameron Sprang, PT, MPT [facility] [address] [place], [place], [address] Phone: [phone]   Fax:  [phone] 01/19/2016, 9:34 AM  Name: REBBECCA TOBER MRN: [id number] Date of Birth: Feb 25, 1936

## 2016-01-19 NOTE — Therapy (Signed)
[facility] [address] [place], [place], [address] Phone: [phone]   Fax:  [phone]  Physical Therapy Treatment  Patient Details  Name: Cynthia [name] MRN: [id number] Date of Birth: 1936/08/02 Referring Provider: Naaman Plummer  Encounter Date: 01/19/2016      PT End of Session - 01/19/16 1515    Visit Number 3   Number of Visits 25   Date for PT Re-Evaluation 03/06/16   Authorization Type UHC Medicare-G-code every 10th visit   PT Start Time 1316   PT Stop Time 1405   PT Time Calculation (min) 49 min   Equipment Utilized During Treatment Gait belt   Activity Tolerance Patient tolerated treatment well   Behavior During Therapy [facility] for tasks assessed/performed      Past Medical History  Diagnosis Date  . Anemia   . Blood transfusion without reported diagnosis jan 2015  . Heart murmur   . Hypertension     no bp meds   . Liver cyst   . C. difficile colitis 03/05/14, 03/22/14, 04/05/14    recurrent  . Stroke ([place]) 12/2014    NUMBNESS ON LEFT SIDE  . Cervical cancer ([place]) 1972  . Colon cancer ([place]) JAN 2015  . Colon cancer ([place])   . Cancer ([place])     liver  . Complication of anesthesia     slow to awaken in past, DONE WELL RECENTLY  . Tinnitus of both ears ALL THE TIME  . DVT (deep venous thrombosis) ([place]) 10/24/13    Right leg    Past Surgical History  Procedure Laterality Date  . Back surgery  1981    lower lumbar  . Tubal ligation  1968  . Laparoscopic right colectomy Right 10/22/2013    Procedure: LAPAROSCOPIC RIGHT COLECTOMY ;  Surgeon: [name], MD;  Location: [facility];  Service: General;  Laterality: Right;  . Salpingoophorectomy  10/22/2013    Procedure: Marquette Saa;  Surgeon: [name]. [name], MD;  Location: [facility];  Service: Gynecology;;  . Abdominal hysterectomy      vaginal- partial  . Esophagogastroduodenoscopy N/A 10/14/2015    Procedure: ESOPHAGOGASTRODUODENOSCOPY  (EGD);  Surgeon: Clarene Essex, MD;  Location: [facility] END[facility]COPY;  Service: Endoscopy;  Laterality: N/A;  Bedside in ICU    There were no vitals filed for this visit.      Subjective Assessment - 01/19/16 1323    Subjective Still having numbness in hands and feet.  Denies falls.   Patient is accompained by: Family member   Pertinent History GBS February 2017, history of DVT in L calf, CVA with L sided numbness (no reported weakness); history of cancer   Limitations Standing;Walking;House hold activities   Patient Stated Goals Pt's goal is to be able to walk without assistive device   Currently in Pain? No/denies  numbness/tingling but no pain            [facility] Adult PT Treatment/Exercise - 01/19/16 0001    Transfers   Transfers Sit to Stand;Stand to Sit   Sit to Stand 5: Supervision;From chair/3-in-1   Sit to Stand Details (indicate cue type and reason) cues for hand placement   Stand to Sit 5: Supervision;To chair/3-in-1   Ambulation/Gait   Ambulation/Gait Yes   Ambulation/Gait Assistance 5: Supervision   Ambulation Distance (Feet) 100 Feet   Assistive device Rolling walker   Gait Pattern Step-through pattern;Decreased step length - right;Decreased step length - left;Decreased dorsiflexion - right;Poor foot clearance - right  Ambulation Surface Level;Indoor   Gait Comments Pt reports ambulating down her ramp to car to come to clinic today as well as ambulating into clinic from car   Knee/Hip Exercises: Standing   Hip Flexion Both;10 reps   Hip Abduction Both;10 reps   Hip Extension Both;10 reps   Other Standing Knee Exercises performed above standing at RW-provided as HEP   Knee/Hip Exercises: Seated   Long Arc Quad Both;5 reps;Weights   Long Arc Quad Weight 3 lbs.   Marching Both;5 reps;Weights   Marching Limitations 3   Knee/Hip Exercises: Supine   Bridges Both;10 reps   Single Leg Bridge Both;10 reps   Straight Leg Raises Both;10 reps;Other (comment)  3# weight   Other  Supine Knee/Hip Exercises sidelying hip abduction x 10 bil then x 10 with yellow theraband   Other Supine Knee/Hip Exercises supine hip abduction bil x 10-unable to complete full range secondary to tight adductors on L so hip adductor stretch x 30 seconds;also hip abduction hooklying x 10 bil then isolated single side x 10                PT Education - 01/19/16 1514    Education provided Yes   Education Details HEP, increasing mobility at home with supervision   Person(s) Educated Patient;Child(ren)   Methods Explanation;Demonstration;Handout   Comprehension Verbalized understanding;Returned demonstration          PT Short Term Goals - 01/19/16 0931    PT SHORT TERM GOAL #1   Title Pt will perform HEP with family supervision for improved strength, ROM, balance and gait.  TARGET 02/05/16   Time 4   Period Weeks   Status New   PT SHORT TERM GOAL #2   Title Pt will improve TUG score to less than or equal to 50 seconds for decreased fall risk.   Time 4   Period Weeks   Status New   PT SHORT TERM GOAL #3   Title Berg Balance test to be assessed, with Berg score improving by at least 5 points.   Baseline 15/56 on 01/18/16   Time 4   Period Weeks   Status New   PT SHORT TERM GOAL #4   Title Pt will improve gait velocity to at least 1 ft/sec for improved gait efficiency and safety.   Time 4   Period Weeks   Status New   PT SHORT TERM GOAL #5   Title Pt will perform sit<>stand transfers, modified independently, for improved transfer safety and efficiency.   Time 4   Period Weeks           PT Long Term Goals - 01/07/16 1157    PT LONG TERM GOAL #1   Title Pt will verbalize understanding of fall prevention within the home environment.  TARGET 03/06/16   Time 8   Period Weeks   Status New   PT LONG TERM GOAL #2   Title Pt will improve TUG score to less than or equal to 40 seconds for decreased fall risk.   Time 8   Period Weeks   Status New   PT LONG TERM GOAL #3    Title Pt will improve Berg Balance score by at least 15 points for decreased fall risk.   Time 8   Period Weeks   Status New   PT LONG TERM GOAL #4   Title Pt will improve gait velocity to 1.5 ft/sec for improved gait efficiency and safety.   Time 8  Period Weeks   Status New               Plan - 01/19/16 1516    Clinical Impression Statement Added to HEP today.  Continue PT per POC.   Rehab Potential Good   PT Frequency 3x / week   PT Duration 8 weeks  plus eval   PT Treatment/Interventions ADLs/Self Care Home Management;Therapeutic exercise;Therapeutic activities;Functional mobility training;Gait training;DME Instruction;Balance training;Neuromuscular re-education;Patient/family education;Orthotic Fit/Training   PT Next Visit Plan balance activities;add additional standing balance activities.  Review current HEP if pt brings handouts from home.   Consulted and Agree with Plan of Care Patient;Family member/caregiver   Family Member Consulted daughter      Patient will benefit from skilled therapeutic intervention in order to improve the following deficits and impairments:  Abnormal gait, Decreased activity tolerance, Decreased balance, Decreased mobility, Decreased endurance, Decreased range of motion, Decreased safety awareness, Decreased strength, Difficulty walking, Impaired sensation, Pain  Visit Diagnosis: Other abnormalities of gait and mobility     Problem List Patient Active Problem List   Diagnosis Date Noted  . Poor appetite   . Neurogenic bladder   . Adjustment disorder with mixed anxiety and depressed mood   . Quadriplegia and quadriparesis ([place])   . Numbness and tingling   . Paresthesias   . UTI (lower urinary tract infection)   . History of colon cancer   . Acute blood loss anemia   . Upper GI bleed   . GBS (Guillain-Barre syndrome) ([place])   . Aspiration into airway   . Endotracheally intubated   . Acute respiratory failure ([place])   . Shock ([place])  10/09/2015  . Hemoptysis   . Transverse myelitis ([place])   . Peripheral neuropathy ([place]) 10/06/2015  . Paresthesia of both hands 10/06/2015  . Paresthesia of both feet 10/06/2015  . Cerebral embolism with cerebral infarction 10/06/2015  . Ataxia   . Colon carcinoma metastatic to liver ([place]) 09/25/2015  . Metastasis to liver ([place]) 08/28/2015  . Colon cancer metastasized to liver ([place])   . Liver lesion   . Cerebral infarction due to unspecified mechanism 01/29/2015  . B12 deficiency 01/29/2015  . Essential hypertension 01/29/2015  . Arm numbness left   . Disorientation   . Numbness and tingling of left side of face   . TIA (transient ischemic attack) 01/11/2015  . Confusion   . Left arm numbness   . DVT, lower extremity, distal ([place]) 10/27/2013  . T4a, N0 09/27/2013  . Iron deficiency anemia 09/04/2013  . Symptomatic anemia 09/03/2013  . Orthostasis 09/03/2013  . Near syncope 09/03/2013    Narda Bonds 01/19/2016, 3:18 PM  [facility] [address] [place], [place], [address] Phone: [phone]   Fax:  [phone]  Name: Cynthia [name] MRN: [id number] Date of Birth: 29-Jan-1936    Narda Bonds, [place] 01/19/2016 3:18 PM Phone: [phone] Fax: [phone]

## 2016-01-19 NOTE — Patient Instructions (Signed)
HIP: Flexion / KNEE: Extension, Straight Leg Raise    Use 2lb weight on ankle. Raise leg, keeping knee straight. Perform slowly. 10 reps per leg, once a day.   Copyright  VHI. All rights reserved.   ABDUCTION: Standing (Active)    Hold onto walker or counter for support.  Stand, feet flat. Lift right leg out to side. Repeat 10 times on each leg once a day.  http://gtsc.exer.us/111   Copyright  VHI. All rights reserved.   HIP / KNEE: Extension - Standing    Use walker or counter for support.  Squeeze glutes. Raise and lift leg backward. Keep knee straight or slightly bent. Repeat 10 times each side once a day.  Copyright  VHI. All rights reserved.   "I love a [employer]or your walker or the counter.  March in place raising knees as hight as possible.  Repeat 10 times. Do 1 sessions per day.  http://gt2.exer.us/345   Copyright  VHI. All rights reserved.

## 2016-01-21 ENCOUNTER — Ambulatory Visit: Payer: Medicare Other | Admitting: Physical Therapy

## 2016-01-21 ENCOUNTER — Encounter: Payer: Self-pay | Admitting: Physical Therapy

## 2016-01-21 DIAGNOSIS — R2689 Other abnormalities of gait and mobility: Secondary | ICD-10-CM

## 2016-01-21 DIAGNOSIS — M6281 Muscle weakness (generalized): Secondary | ICD-10-CM

## 2016-01-21 DIAGNOSIS — R2681 Unsteadiness on feet: Secondary | ICD-10-CM

## 2016-01-21 DIAGNOSIS — G8220 Paraplegia, unspecified: Secondary | ICD-10-CM

## 2016-01-21 NOTE — Therapy (Signed)
[facility] [address] [place], [place], [address] Phone: [phone]   Fax:  [phone]  Physical Therapy Treatment  Patient Details  Name: Cynthia [name] MRN: [id number] Date of Birth: [birth year] Referring Provider: Naaman Plummer  Encounter Date: 01/21/2016      PT End of Session - 01/21/16 1544    Visit Number 4   Number of Visits 25   Date for PT Re-Evaluation 03/06/16   Authorization Type UHC Medicare-G-code every 10th visit   PT Start Time 1450   PT Stop Time 1530   PT Time Calculation (min) 40 min   Equipment Utilized During Treatment Gait belt   Activity Tolerance Patient tolerated treatment well   Behavior During Therapy [facility] for tasks assessed/performed      Past Medical History  Diagnosis Date  . Anemia   . Blood transfusion without reported diagnosis jan 2015  . Heart murmur   . Hypertension     no bp meds   . Liver cyst   . C. difficile colitis 03/05/14, 03/22/14, 04/05/14    recurrent  . Stroke ([place]) 12/2014    NUMBNESS ON LEFT SIDE  . Cervical cancer ([place]) 1972  . Colon cancer ([place]) JAN 2015  . Colon cancer ([place])   . Cancer ([place])     liver  . Complication of anesthesia     slow to awaken in past, DONE WELL RECENTLY  . Tinnitus of both ears ALL THE TIME  . DVT (deep venous thrombosis) ([place]) 10/24/13    Right leg    Past Surgical History  Procedure Laterality Date  . Back surgery  1981    lower lumbar  . Tubal ligation  1968  . Laparoscopic right colectomy Right 10/22/2013    Procedure: LAPAROSCOPIC RIGHT COLECTOMY ;  Surgeon: [name], MD;  Location: [facility];  Service: General;  Laterality: Right;  . Salpingoophorectomy  10/22/2013    Procedure: Marquette Saa;  Surgeon: [name]. [name], MD;  Location: [facility];  Service: Gynecology;;  . Abdominal hysterectomy      vaginal- partial  . Esophagogastroduodenoscopy N/A 10/14/2015    Procedure: ESOPHAGOGASTRODUODENOSCOPY  (EGD);  Surgeon: Clarene Essex, MD;  Location: [facility] ENDOSCOPY;  Service: Endoscopy;  Laterality: N/A;  Bedside in ICU    There were no vitals filed for this visit.      Subjective Assessment - 01/21/16 1452    Subjective Seems to be having more tingling in hands and feet. Has next MD appt 6/21.   Patient is accompained by: Family member   Pertinent History GBS February 2017, history of DVT in L calf, CVA with L sided numbness (no reported weakness); history of cancer   Limitations Standing;Walking;House hold activities   Patient Stated Goals Pt's goal is to be able to walk without assistive device   Currently in Pain? Yes   Pain Score 8    Pain Location Hand   Pain Orientation Right;Left   Pain Descriptors / Indicators Burning;Tingling   Pain Type Neuropathic pain   Pain Onset More than a month ago   Pain Frequency Constant   Aggravating Factors  Doing more with hands and feet.   Pain Relieving Factors pain meds                              Balance Exercises - 01/21/16 1539    Balance Exercises: Standing   Standing Eyes Opened Wide (BOA);Head turns  Min guard to Min A with multiple posterior LOB   Marching Limitations in-place progressing to going forward with 1 UE support, Min guard.           PT Education - 01/21/16 1541    Education provided Yes   Education Details Reviewed and organized pt's past and present HEP : pt currently has LE strengthening exercises in supine, sitting, and standing.  Encourage pt on how to incorporate HEP througout the day and rotate through them. POC in addressing balance and cardiovascular training.   Person(s) Educated Patient;Child(ren)   Methods Explanation;Handout   Comprehension Verbalized understanding          PT Short Term Goals - 01/19/16 0931    PT SHORT TERM GOAL #1   Title Pt will perform HEP with family supervision for improved strength, ROM, balance and gait.  TARGET 02/05/16   Time 4   Period Weeks    Status New   PT SHORT TERM GOAL #2   Title Pt will improve TUG score to less than or equal to 50 seconds for decreased fall risk.   Time 4   Period Weeks   Status New   PT SHORT TERM GOAL #3   Title Berg Balance test to be assessed, with Berg score improving by at least 5 points.   Baseline 15/56 on 01/18/16   Time 4   Period Weeks   Status New   PT SHORT TERM GOAL #4   Title Pt will improve gait velocity to at least 1 ft/sec for improved gait efficiency and safety.   Time 4   Period Weeks   Status New   PT SHORT TERM GOAL #5   Title Pt will perform sit<>stand transfers, modified independently, for improved transfer safety and efficiency.   Time 4   Period Weeks           PT Long Term Goals - 01/07/16 1157    PT LONG TERM GOAL #1   Title Pt will verbalize understanding of fall prevention within the home environment.  TARGET 03/06/16   Time 8   Period Weeks   Status New   PT LONG TERM GOAL #2   Title Pt will improve TUG score to less than or equal to 40 seconds for decreased fall risk.   Time 8   Period Weeks   Status New   PT LONG TERM GOAL #3   Title Pt will improve Berg Balance score by at least 15 points for decreased fall risk.   Time 8   Period Weeks   Status New   PT LONG TERM GOAL #4   Title Pt will improve gait velocity to 1.5 ft/sec for improved gait efficiency and safety.   Time 8   Period Weeks   Status New               Plan - 01/21/16 1545    Clinical Impression Statement Pt is progressing well with strengthening HEP and making gradual progress with functional gait amb. into gym with walker.  Encouraged pt to use RW for gait at home when family is home vs. using w/c. pt verbalized understanding.   Rehab Potential Good   PT Frequency 3x / week   PT Duration 8 weeks  plus eval   PT Treatment/Interventions ADLs/Self Care Home Management;Therapeutic exercise;Therapeutic activities;Functional mobility training;Gait training;DME Instruction;Balance  training;Neuromuscular re-education;Patient/family education;Orthotic Fit/Training   PT Next Visit Plan balance activities;add additional standing balance activities.    Consulted and Agree  with Plan of Care Patient;Family member/caregiver   Family Member Consulted daughter      Patient will benefit from skilled therapeutic intervention in order to improve the following deficits and impairments:  Abnormal gait, Decreased activity tolerance, Decreased balance, Decreased mobility, Decreased endurance, Decreased range of motion, Decreased safety awareness, Decreased strength, Difficulty walking, Impaired sensation, Pain  Visit Diagnosis: Other abnormalities of gait and mobility  Muscle weakness (generalized)  Unsteadiness on feet  Paraparesis of both lower limbs (HCC)     Problem List Patient Active Problem List   Diagnosis Date Noted  . Poor appetite   . Neurogenic bladder   . Adjustment disorder with mixed anxiety and depressed mood   . Quadriplegia and quadriparesis ([place])   . Numbness and tingling   . Paresthesias   . UTI (lower urinary tract infection)   . History of colon cancer   . Acute blood loss anemia   . Upper GI bleed   . GBS (Guillain-Barre syndrome) ([place])   . Aspiration into airway   . Endotracheally intubated   . Acute respiratory failure ([place])   . Shock ([place]) 10/09/2015  . Hemoptysis   . Transverse myelitis ([place])   . Peripheral neuropathy ([place]) 10/06/2015  . Paresthesia of both hands 10/06/2015  . Paresthesia of both feet 10/06/2015  . Cerebral embolism with cerebral infarction 10/06/2015  . Ataxia   . Colon carcinoma metastatic to liver ([place]) 09/25/2015  . Metastasis to liver ([place]) 08/28/2015  . Colon cancer metastasized to liver ([place])   . Liver lesion   . Cerebral infarction due to unspecified mechanism 01/29/2015  . B12 deficiency 01/29/2015  . Essential hypertension 01/29/2015  . Arm numbness left   . Disorientation   . Numbness and tingling of  left side of face   . TIA (transient ischemic attack) 01/11/2015  . Confusion   . Left arm numbness   . DVT, lower extremity, distal ([place]) 10/27/2013  . T4a, N0 09/27/2013  . Iron deficiency anemia 09/04/2013  . Symptomatic anemia 09/03/2013  . Orthostasis 09/03/2013  . Near syncope 09/03/2013    Bjorn Loser, PTA  01/21/2016, 3:49 PM [facility] [address] [place], [place], [address] Phone: [phone]   Fax:  [phone]  Name: Cynthia [name] MRN: [id number] Date of Birth: Nov 15, 1935

## 2016-01-25 ENCOUNTER — Ambulatory Visit: Payer: Medicare Other | Admitting: Physical Therapy

## 2016-01-25 DIAGNOSIS — M6281 Muscle weakness (generalized): Secondary | ICD-10-CM

## 2016-01-25 DIAGNOSIS — G8220 Paraplegia, unspecified: Secondary | ICD-10-CM

## 2016-01-25 DIAGNOSIS — R2681 Unsteadiness on feet: Secondary | ICD-10-CM

## 2016-01-25 DIAGNOSIS — R2689 Other abnormalities of gait and mobility: Secondary | ICD-10-CM

## 2016-01-25 NOTE — Therapy (Signed)
[facility] [address] [place], [place], [address] Phone: [phone]   Fax:  [phone]  Physical Therapy Treatment  Patient Details  Name: Cynthia [name] MRN: [id number] Date of Birth: 09/03/1935 Referring Provider: Naaman Plummer  Encounter Date: 01/25/2016      PT End of Session - 01/25/16 1531    Visit Number 5   Number of Visits 25   Date for PT Re-Evaluation 03/06/16   Authorization Type UHC Medicare-G-code every 10th visit   PT Start Time 1450   PT Stop Time 1533   PT Time Calculation (min) 43 min   Equipment Utilized During Treatment Gait belt   Activity Tolerance Patient tolerated treatment well   Behavior During Therapy [facility] for tasks assessed/performed      Past Medical History  Diagnosis Date  . Anemia   . Blood transfusion without reported diagnosis jan 2015  . Heart murmur   . Hypertension     no bp meds   . Liver cyst   . C. difficile colitis 03/05/14, 03/22/14, 04/05/14    recurrent  . Stroke ([place]) 12/2014    NUMBNESS ON LEFT SIDE  . Cervical cancer ([place]) 1972  . Colon cancer ([place]) JAN 2015  . Colon cancer ([place])   . Cancer ([place])     liver  . Complication of anesthesia     slow to awaken in past, DONE WELL RECENTLY  . Tinnitus of both ears ALL THE TIME  . DVT (deep venous thrombosis) ([place]) 10/24/13    Right leg    Past Surgical History  Procedure Laterality Date  . Back surgery  1981    lower lumbar  . Tubal ligation  1968  . Laparoscopic right colectomy Right 10/22/2013    Procedure: LAPAROSCOPIC RIGHT COLECTOMY ;  Surgeon: [name], MD;  Location: [facility];  Service: General;  Laterality: Right;  . Salpingoophorectomy  10/22/2013    Procedure: Marquette Saa;  Surgeon: [name]. [name], MD;  Location: [facility];  Service: Gynecology;;  . Abdominal hysterectomy      vaginal- partial  . Esophagogastroduodenoscopy N/A 10/14/2015    Procedure: ESOPHAGOGASTRODUODENOSCOPY  (EGD);  Surgeon: Clarene Essex, MD;  Location: [facility] ENDOSCOPY;  Service: Endoscopy;  Laterality: N/A;  Bedside in ICU    There were no vitals filed for this visit.      Subjective Assessment - 01/25/16 1453    Subjective I'm doing more things without w/c, sitting at dining table, walking more, standing at the sink.   Patient is accompained by: Family member   Pertinent History GBS February 2017, history of DVT in L calf, CVA with L sided numbness (no reported weakness); history of cancer   Limitations Standing;Walking;House hold activities   Patient Stated Goals Pt's goal is to be able to walk without assistive device   Currently in Pain? Yes   Pain Score 8    Pain Location Hand   Pain Orientation Right;Left   Pain Descriptors / Indicators Tingling;Burning   Pain Onset More than a month ago                         [facility] Adult PT Treatment/Exercise - 01/25/16 0001    Knee/Hip Exercises: Aerobic   Nustep L=5 x 84min             Balance Exercises - 01/25/16 1515    Balance Exercises: Standing   Other Standing Exercises standing at the sink with  wt shifts in various directions working on progressing to intermttent to no UE support; supervision level                 see HEP handout below for details           PT Education - 01/25/16 1507    Education provided Yes   Education Details Progressed HEP with standing at the sink exercise to increase activity tolerance and balance.   Person(s) Educated Patient;Child(ren)   Methods Explanation;Demonstration;Tactile cues;Verbal cues;Handout   Comprehension Verbalized understanding;Need further instruction;Returned demonstration;Verbal cues required          PT Short Term Goals - 01/19/16 0931    PT SHORT TERM GOAL #1   Title Pt will perform HEP with family supervision for improved strength, ROM, balance and gait.  TARGET 02/05/16   Time 4   Period Weeks   Status New   PT SHORT TERM GOAL #2   Title Pt will  improve TUG score to less than or equal to 50 seconds for decreased fall risk.   Time 4   Period Weeks   Status New   PT SHORT TERM GOAL #3   Title Berg Balance test to be assessed, with Berg score improving by at least 5 points.   Baseline 15/56 on 01/18/16   Time 4   Period Weeks   Status New   PT SHORT TERM GOAL #4   Title Pt will improve gait velocity to at least 1 ft/sec for improved gait efficiency and safety.   Time 4   Period Weeks   Status New   PT SHORT TERM GOAL #5   Title Pt will perform sit<>stand transfers, modified independently, for improved transfer safety and efficiency.   Time 4   Period Weeks           PT Long Term Goals - 01/07/16 1157    PT LONG TERM GOAL #1   Title Pt will verbalize understanding of fall prevention within the home environment.  TARGET 03/06/16   Time 8   Period Weeks   Status New   PT LONG TERM GOAL #2   Title Pt will improve TUG score to less than or equal to 40 seconds for decreased fall risk.   Time 8   Period Weeks   Status New   PT LONG TERM GOAL #3   Title Pt will improve Berg Balance score by at least 15 points for decreased fall risk.   Time 8   Period Weeks   Status New   PT LONG TERM GOAL #4   Title Pt will improve gait velocity to 1.5 ft/sec for improved gait efficiency and safety.   Time 8   Period Weeks   Status New               Plan - 01/25/16 1529    Clinical Impression Statement Progressed HEP with standing at the sink exercise to increase activity tolerance and balance; pt requires intermittent UE support and seated rest due to fatigue.   Rehab Potential Good   PT Frequency 3x / week   PT Duration 8 weeks  plus eval   PT Treatment/Interventions ADLs/Self Care Home Management;Therapeutic exercise;Therapeutic activities;Functional mobility training;Gait training;DME Instruction;Balance training;Neuromuscular re-education;Patient/family education;Orthotic Fit/Training   PT Next Visit Plan balance  activities;add additional standing balance activities.    Consulted and Agree with Plan of Care Patient;Family member/caregiver   Family Member Consulted daughter      Patient will benefit from  skilled therapeutic intervention in order to improve the following deficits and impairments:  Abnormal gait, Decreased activity tolerance, Decreased balance, Decreased mobility, Decreased endurance, Decreased range of motion, Decreased safety awareness, Decreased strength, Difficulty walking, Impaired sensation, Pain  Visit Diagnosis: Other abnormalities of gait and mobility  Muscle weakness (generalized)  Unsteadiness on feet  Paraparesis of both lower limbs (HCC)     Problem List Patient Active Problem List   Diagnosis Date Noted  . Poor appetite   . Neurogenic bladder   . Adjustment disorder with mixed anxiety and depressed mood   . Quadriplegia and quadriparesis ([place])   . Numbness and tingling   . Paresthesias   . UTI (lower urinary tract infection)   . History of colon cancer   . Acute blood loss anemia   . Upper GI bleed   . GBS (Guillain-Barre syndrome) ([place])   . Aspiration into airway   . Endotracheally intubated   . Acute respiratory failure ([place])   . Shock ([place]) 10/09/2015  . Hemoptysis   . Transverse myelitis ([place])   . Peripheral neuropathy ([place]) 10/06/2015  . Paresthesia of both hands 10/06/2015  . Paresthesia of both feet 10/06/2015  . Cerebral embolism with cerebral infarction 10/06/2015  . Ataxia   . Colon carcinoma metastatic to liver ([place]) 09/25/2015  . Metastasis to liver ([place]) 08/28/2015  . Colon cancer metastasized to liver ([place])   . Liver lesion   . Cerebral infarction due to unspecified mechanism 01/29/2015  . B12 deficiency 01/29/2015  . Essential hypertension 01/29/2015  . Arm numbness left   . Disorientation   . Numbness and tingling of left side of face   . TIA (transient ischemic attack) 01/11/2015  . Confusion   . Left arm numbness   . DVT,  lower extremity, distal ([place]) 10/27/2013  . T4a, N0 09/27/2013  . Iron deficiency anemia 09/04/2013  . Symptomatic anemia 09/03/2013  . Orthostasis 09/03/2013  . Near syncope 09/03/2013    Bjorn Loser, PTA  01/25/2016, 3:45 PM [facility] [address] [place], [place], [address] Phone: [phone]   Fax:  [phone]  Name: Cynthia [name] MRN: [id number] Date of Birth: October 05, 1935

## 2016-01-25 NOTE — Patient Instructions (Signed)
Do each exercise **1*  times per day Do each exercise **10* repetitions Hold each exercise for *3** seconds to feel your location  AT [place].  USE TAPE ON FLOOR TO MARK THE MIDLINE POSITION.   1. Side to Side Shift: Moving your hips only : move weight onto your left leg, HOLD/FEEL.  Move back to equal weight on each leg, HOLD/FEEL. Move weight onto your right leg, HOLD/FEEL. Move back to equal weight on each leg, HOLD/FEEL. Repeat. 2. Front to Back Shift: Moving your hips only : move your weight forward onto your toes, HOLD/FEEL. Move your weight back to equal Flat Foot on both legs, HOLD/FEEL. Move your weight back onto your heels, HOLD/FEEL. Move your weight back to equal on both legs, HOLD/FEEL. Repeat. 3. Moving Cones / Cups: With equal weight on each leg: Hold on with one hand the first time, then progress to no hand supports. Move cups from one side of sink to the other. Place cups ~2" out of your reach, progress to 10" beyond reach. 4. Overhead/Upward Reaching: alternated reaching up to top cabinets or ceiling if no cabinets present. Keep equal weight on each leg. Start with one hand support on counter while other hand reaches and progress to no hand support with reaching. 5.   Looking Over Shoulders: With equal weight on each leg: alternate turning to look over your shoulders with one hand support on counter as needed. Shift weight to  side looking, pull hip then shoulder then head/eyes around to look behind you. Start  with one hand support & progress to no hand support.

## 2016-01-27 ENCOUNTER — Ambulatory Visit: Payer: Medicare Other | Admitting: Physical Therapy

## 2016-01-27 DIAGNOSIS — R2689 Other abnormalities of gait and mobility: Secondary | ICD-10-CM | POA: Diagnosis not present

## 2016-01-27 DIAGNOSIS — R2681 Unsteadiness on feet: Secondary | ICD-10-CM

## 2016-01-27 DIAGNOSIS — G8220 Paraplegia, unspecified: Secondary | ICD-10-CM

## 2016-01-27 DIAGNOSIS — M6281 Muscle weakness (generalized): Secondary | ICD-10-CM

## 2016-01-27 NOTE — Therapy (Signed)
[facility] [address] [place], [place], [address] Phone: [phone]   Fax:  [phone]  Physical Therapy Treatment  Patient Details  Name: Cynthia [name] MRN: [id number] Date of Birth: 09-24-1935 Referring Provider: Naaman Plummer  Encounter Date: 01/27/2016      PT End of Session - 01/27/16 1543    Visit Number 6   Number of Visits 25   Date for PT Re-Evaluation 03/06/16   Authorization Type UHC Medicare-G-code every 10th visit   PT Start Time 1450   PT Stop Time 1530   PT Time Calculation (min) 40 min   Equipment Utilized During Treatment Gait belt   Activity Tolerance Patient tolerated treatment well   Behavior During Therapy [facility] for tasks assessed/performed      Past Medical History  Diagnosis Date  . Anemia   . Blood transfusion without reported diagnosis jan 2015  . Heart murmur   . Hypertension     no bp meds   . Liver cyst   . C. difficile colitis 03/05/14, 03/22/14, 04/05/14    recurrent  . Stroke ([place]) 12/2014    NUMBNESS ON LEFT SIDE  . Cervical cancer ([place]) 1972  . Colon cancer ([place]) JAN 2015  . Colon cancer ([place])   . Cancer ([place])     liver  . Complication of anesthesia     slow to awaken in past, DONE WELL RECENTLY  . Tinnitus of both ears ALL THE TIME  . DVT (deep venous thrombosis) ([place]) 10/24/13    Right leg    Past Surgical History  Procedure Laterality Date  . Back surgery  1981    lower lumbar  . Tubal ligation  1968  . Laparoscopic right colectomy Right 10/22/2013    Procedure: LAPAROSCOPIC RIGHT COLECTOMY ;  Surgeon: [name], MD;  Location: [facility];  Service: General;  Laterality: Right;  . Salpingoophorectomy  10/22/2013    Procedure: Marquette Saa;  Surgeon: [name]. [name], MD;  Location: [facility];  Service: Gynecology;;  . Abdominal hysterectomy      vaginal- partial  . Esophagogastroduodenoscopy N/A 10/14/2015    Procedure: ESOPHAGOGASTRODUODENOSCOPY  (EGD);  Surgeon: Clarene Essex, MD;  [facility] END[facility];  Service: Endoscopy;  Laterality: N/A;  Bedside in ICU    There were no vitals filed for this visit.      Subjective Assessment - 01/27/16 1455    Subjective Reports doing HEP at home and using walker a lot more when family is at home.   Patient is accompained by: Family member   Pertinent History GBS February 2017, history of DVT in L calf, CVA with L sided numbness (no reported weakness); history of cancer   Limitations Standing;Walking;House hold activities   Patient Stated Goals Pt's goal is to be able to walk without assistive device   Currently in Pain? Yes   Pain Score 7    Pain Location Foot  and feet   Pain Orientation Right;Left   Pain Descriptors / Indicators Tingling;Burning   Pain Type Chronic pain   Pain Onset More than a month ago   Pain Frequency Constant                         [facility] Adult PT Treatment/Exercise - 01/27/16 0001    Ambulation/Gait   Ambulation/Gait Yes   Ambulation/Gait Assistance 4: Min guard   Ambulation Distance (Feet) 115 Feet  + 50 working on technique in figuere 8  pattern (cues for [facility]   Assistive device Straight cane   Gait Pattern Step-through pattern;Decreased arm swing - left;Decreased step length - right;Decreased step length - left;Decreased stance time - left;Decreased trunk rotation   Ambulation Surface Level   Gait Comments Good initial sequence pattern  with SPC.             Balance Exercises - 01/27/16 1502    Balance Exercises: Standing   Standing Eyes Opened Wide (BOA);Head turns/nods/diagonals  + wt shifts with A/P and lateral hip sway, intermittent UE support   Other Standing Exercises Alternate kicks with 1  UE support; Marching, forward, backward,sideways 10x each  supervision level.           PT Education - 01/27/16 1542    Education provided Yes   Education Details How to plan HEP schedule during the day   Person(s) Educated  Patient;Child(ren)   Methods Explanation   Comprehension Verbalized understanding          PT Short Term Goals - 01/19/16 0931    PT SHORT TERM GOAL #1   Title Pt will perform HEP with family supervision for improved strength, ROM, balance and gait.  TARGET 02/05/16   Time 4   Period Weeks   Status New   PT SHORT TERM GOAL #2   Title Pt will improve TUG score to less than or equal to 50 seconds for decreased fall risk.   Time 4   Period Weeks   Status New   PT SHORT TERM GOAL #3   Title Berg Balance test to be assessed, with Berg score improving by at least 5 points.   Baseline 15/56 on 01/18/16   Time 4   Period Weeks   Status New   PT SHORT TERM GOAL #4   Title Pt will improve gait velocity to at least 1 ft/sec for improved gait efficiency and safety.   Time 4   Period Weeks   Status New   PT SHORT TERM GOAL #5   Title Pt will perform sit<>stand transfers, modified independently, for improved transfer safety and efficiency.   Time 4   Period Weeks           PT Long Term Goals - 01/07/16 1157    PT LONG TERM GOAL #1   Title Pt will verbalize understanding of fall prevention within the home environment.  TARGET 03/06/16   Time 8   Period Weeks   Status New   PT LONG TERM GOAL #2   Title Pt will improve TUG score to less than or equal to 40 seconds for decreased fall risk.   Time 8   Period Weeks   Status New   PT LONG TERM GOAL #3   Title Pt will improve Berg Balance score by at least 15 points for decreased fall risk.   Time 8   Period Weeks   Status New   PT LONG TERM GOAL #4   Title Pt will improve gait velocity to 1.5 ft/sec for improved gait efficiency and safety.   Time 8   Period Weeks   Status New               Plan - 01/27/16 1544    Clinical Impression Statement Progressed gait training with SPC; pt demonstrating good sequence pattern and requiring Min Guard.  Pt requires intermittent UE support with standing balance activites with in BOS.    Rehab Potential Good   PT Frequency 3x / week  PT Duration 8 weeks  plus eval   PT Treatment/Interventions ADLs/Self Care Home Management;Therapeutic exercise;Therapeutic activities;Functional mobility training;Gait training;DME Instruction;Balance training;Neuromuscular re-education;Patient/family education;Orthotic Fit/Training   PT Next Visit Plan balance activities; gait with SPC   Consulted and Agree with Plan of Care Patient;Family member/caregiver   Family Member Consulted daughter      Patient will benefit from skilled therapeutic intervention in order to improve the following deficits and impairments:  Abnormal gait, Decreased activity tolerance, Decreased balance, Decreased mobility, Decreased endurance, Decreased range of motion, Decreased safety awareness, Decreased strength, Difficulty walking, Impaired sensation, Pain  Visit Diagnosis: Other abnormalities of gait and mobility  Muscle weakness (generalized)  Unsteadiness on feet  Paraparesis of both lower limbs (HCC)     Problem List Patient Active Problem List   Diagnosis Date Noted  . Poor appetite   . Neurogenic bladder   . Adjustment disorder with mixed anxiety and depressed mood   . Quadriplegia and quadriparesis ([place])   . Numbness and tingling   . Paresthesias   . UTI (lower urinary tract infection)   . History of colon cancer   . Acute blood loss anemia   . Upper GI bleed   . GBS (Guillain-Barre syndrome) ([place])   . Aspiration into airway   . Endotracheally intubated   . Acute respiratory failure ([place])   . Shock ([place]) 10/09/2015  . Hemoptysis   . Transverse myelitis ([place])   . Peripheral neuropathy ([place]) 10/06/2015  . Paresthesia of both hands 10/06/2015  . Paresthesia of both feet 10/06/2015  . Cerebral embolism with cerebral infarction 10/06/2015  . Ataxia   . Colon carcinoma metastatic to liver ([place]) 09/25/2015  . Metastasis to liver ([place]) 08/28/2015  . Colon cancer metastasized to liver  ([place])   . Liver lesion   . Cerebral infarction due to unspecified mechanism 01/29/2015  . B12 deficiency 01/29/2015  . Essential hypertension 01/29/2015  . Arm numbness left   . Disorientation   . Numbness and tingling of left side of face   . TIA (transient ischemic attack) 01/11/2015  . Confusion   . Left arm numbness   . DVT, lower extremity, distal ([place]) 10/27/2013  . T4a, N0 09/27/2013  . Iron deficiency anemia 09/04/2013  . Symptomatic anemia 09/03/2013  . Orthostasis 09/03/2013  . Near syncope 09/03/2013    Bjorn Loser, PTA  01/27/2016, 3:48 PM [facility] [address] [place], [place], [address] Phone: [phone]   Fax:  [phone]  Name: Cynthia [name] MRN: [id number] Date of Birth: 1936-02-26

## 2016-01-28 ENCOUNTER — Ambulatory Visit: Payer: Self-pay | Admitting: Rehabilitation

## 2016-01-29 ENCOUNTER — Encounter: Payer: Self-pay | Admitting: Rehabilitation

## 2016-01-29 ENCOUNTER — Ambulatory Visit: Payer: Medicare Other | Admitting: Rehabilitation

## 2016-01-29 ENCOUNTER — Telehealth: Payer: Self-pay | Admitting: Rehabilitation

## 2016-01-29 DIAGNOSIS — M6281 Muscle weakness (generalized): Secondary | ICD-10-CM

## 2016-01-29 DIAGNOSIS — G8250 Quadriplegia, unspecified: Secondary | ICD-10-CM

## 2016-01-29 DIAGNOSIS — R2689 Other abnormalities of gait and mobility: Secondary | ICD-10-CM | POA: Diagnosis not present

## 2016-01-29 DIAGNOSIS — R2681 Unsteadiness on feet: Secondary | ICD-10-CM

## 2016-01-29 DIAGNOSIS — G610 Guillain-Barre syndrome: Secondary | ICD-10-CM

## 2016-01-29 LAB — CBC
HEMATOCRIT: 37.2 % (ref 35.0–45.0)
Hemoglobin: 11.7 g/dL (ref 11.7–15.5)
MCH: 27.9 pg (ref 27.0–33.0)
MCHC: 31.5 g/dL — AB (ref 32.0–36.0)
MCV: 88.8 fL (ref 80.0–100.0)
MPV: 10.2 fL (ref 7.5–12.5)
Platelets: 243 10*3/uL (ref 140–400)
RBC: 4.19 MIL/uL (ref 3.80–5.10)
RDW: 14.9 % (ref 11.0–15.0)
WBC: 5.3 10*3/uL (ref 3.8–10.8)

## 2016-01-29 LAB — PROTIME-INR
INR: 1.1
Prothrombin Time: 12.1 s — ABNORMAL HIGH (ref 9.0–11.5)

## 2016-01-29 NOTE — Telephone Encounter (Signed)
Order written, can someone please fax to [facility]?  thx

## 2016-01-29 NOTE — Telephone Encounter (Signed)
Dr. Mardella Layman,   I have been working with Cynthia [name] at [facility] neuro PT.  She is progressing nicely and feel that she is ready to begin gait with SPC with daughter at home.  Please submit order and we can provide from our supply that [facility] has for [place] in the clinic.    Thanks,  Cameron Sprang, PT, MPT [facility] [address] [place], [place], [address] Phone: [phone]   Fax:  [phone] 01/29/2016, 10:01 AM

## 2016-01-29 NOTE — Therapy (Signed)
[facility] [address] [place], [place], [address] Phone: [phone]   Fax:  [phone]  Physical Therapy Treatment  Patient Details  Name: Cynthia [name] MRN: [id number] Date of Birth: May 16, 1936 Referring Provider: Naaman Plummer  Encounter Date: 01/29/2016      PT End of Session - 01/29/16 0808    Visit Number 7   Number of Visits 25   Date for PT Re-Evaluation 03/06/16   Authorization Type UHC Medicare-G-code every 10th visit   PT Start Time 0805  pt arrived late to session   PT Stop Time 0846   PT Time Calculation (min) 41 min   Equipment Utilized During Treatment Gait belt   Activity Tolerance Patient tolerated treatment well   Behavior During Therapy [facility] for tasks assessed/performed      Past Medical History  Diagnosis Date  . Anemia   . Blood transfusion without reported diagnosis jan 2015  . Heart murmur   . Hypertension     no bp meds   . Liver cyst   . C. difficile colitis 03/05/14, 03/22/14, 04/05/14    recurrent  . Stroke ([place]) 12/2014    NUMBNESS ON LEFT SIDE  . Cervical cancer ([place]) 1972  . Colon cancer ([place]) JAN 2015  . Colon cancer ([place])   . Cancer ([place])     liver  . Complication of anesthesia     slow to awaken in past, DONE WELL RECENTLY  . Tinnitus of both ears ALL THE TIME  . DVT (deep venous thrombosis) ([place]) 10/24/13    Right leg    Past Surgical History  Procedure Laterality Date  . Back surgery  1981    lower lumbar  . Tubal ligation  1968  . Laparoscopic right colectomy Right 10/22/2013    Procedure: LAPAROSCOPIC RIGHT COLECTOMY ;  Surgeon: [name], MD;  [facility];  Service: General;  Laterality: Right;  . Salpingoophorectomy  10/22/2013    Procedure: Marquette Saa;  Surgeon: [name]. [name], MD;  Location: [facility];  Service: Gynecology;;  . Abdominal hysterectomy      vaginal- partial  . Esophagogastroduodenoscopy N/A 10/14/2015   Procedure: ESOPHAGOGASTRODUODENOSCOPY (EGD);  Surgeon: Clarene Essex, MD;  Location: [facility] ENDOSCOPY;  Service: Endoscopy;  Laterality: N/A;  Bedside in ICU    There were no vitals filed for this visit.      Subjective Assessment - 01/29/16 0806    Subjective Reports doing exercises at home and walking more with the cane.     Patient is accompained by: Family member   Pertinent History GBS February 2017, history of DVT in L calf, CVA with L sided numbness (no reported weakness); history of cancer   Limitations Standing;Walking;House hold activities   Patient Stated Goals Pt's goal is to be able to walk without assistive device   Currently in Pain? Yes   Pain Score 7    Pain Location Foot   Pain Orientation Right;Left   Pain Descriptors / Indicators Tingling;Burning   Pain Type Chronic pain   Pain Onset More than a month ago   Aggravating Factors  doing more with hands and feet   Pain Relieving Factors pain meds                         [facility] Adult PT Treatment/Exercise - 01/29/16 0816    Ambulation/Gait   Ambulation/Gait Yes   Ambulation/Gait Assistance 4: Min guard   Ambulation Distance (Feet)  230 Feet  and 115'   Assistive device Straight cane   Gait Pattern Step-through pattern;Decreased arm swing - left;Decreased step length - right;Decreased step length - left;Decreased stance time - left;Decreased trunk rotation   Ambulation Surface Level;Indoor   Gait Comments Continues to demonstrate good sequencing with SPC with min cues for posture and for larger step on R and also for wider steps as she tends to have narrow BOS.  Pt with some difficulty placing feet when walking, however feel that much of her looking down correlates to lack of confidence in herself.  Then performed gait with [facility] as pt states she has this equipment at home.  Performed another 54' with SBQC at min/guard level.  Pt did not like that cane felt "tippy" however did edcuate that she could begin to  work on this at home with [place] from daughter in order to better progress to [facility].  PT to ask for order for [facility] from [facility] and will provide as able.  Both pt and daughter verbalized understanding.    Self-Care   Self-Care Other Self-Care Comments   Other Self-Care Comments  Performed floor recovery during session to ensure pt able to get up in case of fall at home.  PT demonstrated technique and pt able to return demonstration at min/guard to min A level.  Daughter verbalized understanding on how to assist if needed.     Neuro Re-ed    Neuro Re-ed Details  Performed cone tapping while standing on red gym mat to address balance deficits.  Alternated LEs x 10 reps progressing to tipping cone over then back to upright in order to increase time spent in SLS.  Provided light L HHA initially progressing to no UE support.  Pt needing min/guard to min A intermittnetly for LOB.                 PT Education - 01/29/16 0807    Education provided Yes   Education Details educated to begin ambulation with SBQC at home with CGA from daughter until PT can get order for [facility] from [facility].    Person(s) Educated Patient;Child(ren)   Methods Explanation;Demonstration   Comprehension Verbalized understanding;Returned demonstration          PT Short Term Goals - 01/19/16 0931    PT SHORT TERM GOAL #1   Title Pt will perform HEP with family supervision for improved strength, ROM, balance and gait.  TARGET 02/05/16   Time 4   Period Weeks   Status New   PT SHORT TERM GOAL #2   Title Pt will improve TUG score to less than or equal to 50 seconds for decreased fall risk.   Time 4   Period Weeks   Status New   PT SHORT TERM GOAL #3   Title Berg Balance test to be assessed, with Berg score improving by at least 5 points.   Baseline 15/56 on 01/18/16   Time 4   Period Weeks   Status New   PT SHORT TERM GOAL #4   Title Pt will improve gait velocity to at least 1 ft/sec for improved gait efficiency and safety.    Time 4   Period Weeks   Status New   PT SHORT TERM GOAL #5   Title Pt will perform sit<>stand transfers, modified independently, for improved transfer safety and efficiency.   Time 4   Period Weeks           PT Long Term Goals - 01/07/16  [place] #1   Title Pt will verbalize understanding of fall prevention within the home environment.  TARGET 03/06/16   Time 8   Period Weeks   Status New   PT LONG TERM GOAL #2   Title Pt will improve TUG score to less than or equal to 40 seconds for decreased fall risk.   Time 8   Period Weeks   Status New   PT LONG TERM GOAL #3   Title Pt will improve Berg Balance score by at least 15 points for decreased fall risk.   Time 8   Period Weeks   Status New   PT LONG TERM GOAL #4   Title Pt will improve gait velocity to 1.5 ft/sec for improved gait efficiency and safety.   Time 8   Period Weeks   Status New               Plan - 01/29/16 0950    Clinical Impression Statement Continued gait training with use of SPC over indoor surfaces and also negotiation of stairs, curb and ramp with SPC to progress towards more community ambulation.  Tolerated well with min/guard to min A.  Also addressed high level balance and floor transfer for fall recovery during session.  Tolerated very well, continue POC.     Rehab Potential Good   PT Frequency 3x / week   PT Duration 8 weeks  plus eval   PT Treatment/Interventions ADLs/Self Care Home Management;Therapeutic exercise;Therapeutic activities;Functional mobility training;Gait training;DME Instruction;Balance training;Neuromuscular re-education;Patient/family education;Orthotic Fit/Training   PT Next Visit Plan balance activities; gait with SPC-indoors, outdoors (paved)   Consulted and Agree with Plan of Care Patient;Family member/caregiver   Family Member Consulted daughter      Patient will benefit from skilled therapeutic intervention in order to improve the following  deficits and impairments:  Abnormal gait, Decreased activity tolerance, Decreased balance, Decreased mobility, Decreased endurance, Decreased range of motion, Decreased safety awareness, Decreased strength, Difficulty walking, Impaired sensation, Pain  Visit Diagnosis: Other abnormalities of gait and mobility  Muscle weakness (generalized)  Unsteadiness on feet     Problem List Patient Active Problem List   Diagnosis Date Noted  . Poor appetite   . Neurogenic bladder   . Adjustment disorder with mixed anxiety and depressed mood   . Quadriplegia and quadriparesis ([place])   . Numbness and tingling   . Paresthesias   . UTI (lower urinary tract infection)   . History of colon cancer   . Acute blood loss anemia   . Upper GI bleed   . GBS (Guillain-Barre syndrome) ([place])   . Aspiration into airway   . Endotracheally intubated   . Acute respiratory failure ([place])   . Shock ([place]) 10/09/2015  . Hemoptysis   . Transverse myelitis ([place])   . Peripheral neuropathy ([place]) 10/06/2015  . Paresthesia of both hands 10/06/2015  . Paresthesia of both feet 10/06/2015  . Cerebral embolism with cerebral infarction 10/06/2015  . Ataxia   . Colon carcinoma metastatic to liver ([place]) 09/25/2015  . Metastasis to liver ([place]) 08/28/2015  . Colon cancer metastasized to liver ([place])   . Liver lesion   . Cerebral infarction due to unspecified mechanism 01/29/2015  . B12 deficiency 01/29/2015  . Essential hypertension 01/29/2015  . Arm numbness left   . Disorientation   . Numbness and tingling of left side of face   . TIA (transient ischemic attack) 01/11/2015  . Confusion   .  Left arm numbness   . DVT, lower extremity, distal ([place]) 10/27/2013  . T4a, N0 09/27/2013  . Iron deficiency anemia 09/04/2013  . Symptomatic anemia 09/03/2013  . Orthostasis 09/03/2013  . Near syncope 09/03/2013    Cameron Sprang, PT, MPT [facility] [address] [place],  [place], [address] Phone: [phone]   Fax:  [phone]   01/29/2016, 9:58 AM  Name: Cynthia [name] MRN: [id number] Date of Birth: 09/29/1935

## 2016-01-30 LAB — COMPLETE METABOLIC PANEL WITH GFR
ALT: 11 U/L (ref 6–29)
AST: 20 U/L (ref 10–35)
Albumin: 3.5 g/dL — ABNORMAL LOW (ref 3.6–5.1)
Alkaline Phosphatase: 37 U/L (ref 33–130)
BUN: 13 mg/dL (ref 7–25)
CALCIUM: 9.2 mg/dL (ref 8.6–10.4)
CHLORIDE: 106 mmol/L (ref 98–110)
CO2: 27 mmol/L (ref 20–31)
CREATININE: 0.66 mg/dL (ref 0.60–0.93)
GFR, Est African American: >89 mL/min (ref >=60)
GFR, Est Non African American: 85 mL/min (ref >=60)
Glucose, Bld: 81 mg/dL (ref 65–99)
Potassium: 4.4 mmol/L (ref 3.5–5.3)
SODIUM: 145 mmol/L (ref 135–146)
TOTAL PROTEIN: 6 g/dL — AB (ref 6.1–8.1)
Total Bilirubin: 0.4 mg/dL (ref 0.2–1.2)

## 2016-02-01 ENCOUNTER — Encounter: Payer: Self-pay | Admitting: Rehabilitation

## 2016-02-01 ENCOUNTER — Ambulatory Visit: Payer: Medicare Other | Admitting: Rehabilitation

## 2016-02-01 DIAGNOSIS — M6281 Muscle weakness (generalized): Secondary | ICD-10-CM

## 2016-02-01 DIAGNOSIS — R2689 Other abnormalities of gait and mobility: Secondary | ICD-10-CM

## 2016-02-01 DIAGNOSIS — R2681 Unsteadiness on feet: Secondary | ICD-10-CM

## 2016-02-01 NOTE — Therapy (Signed)
[facility] [address] [place], [place], [address] Phone: [phone]   Fax:  [phone]  Physical Therapy Treatment  Patient Details  Name: Cynthia [name] MRN: [id number] Date of Birth: 04-19-1936 Referring Provider: Naaman Plummer  Encounter Date: 02/01/2016      PT End of Session - 02/01/16 1539    Visit Number 8   Number of Visits 25   Date for PT Re-Evaluation 03/06/16   Authorization Type [employer] Medicare-G-code every 10th visit   PT Start Time 1530   PT Stop Time 1615   PT Time Calculation (min) 45 min   Equipment Utilized During Treatment Gait belt   Activity Tolerance Patient tolerated treatment well   Behavior During Therapy [facility] for tasks assessed/performed      Past Medical History  Diagnosis Date  . Anemia   . Blood transfusion without reported diagnosis jan 2015  . Heart murmur   . Hypertension     no bp meds   . Liver cyst   . C. difficile colitis 03/05/14, 03/22/14, 04/05/14    recurrent  . Stroke ([place]) 12/2014    NUMBNESS ON LEFT SIDE  . Cervical cancer ([place]) 1972  . Colon cancer ([place]) JAN 2015  . Colon cancer ([place])   . Cancer ([place])     liver  . Complication of anesthesia     slow to awaken in past, DONE WELL RECENTLY  . Tinnitus of both ears ALL THE TIME  . DVT (deep venous thrombosis) ([place]) 10/24/13    Right leg    Past Surgical History  Procedure Laterality Date  . Back surgery  1981    lower lumbar  . Tubal ligation  1968  . Laparoscopic right colectomy Right 10/22/2013    Procedure: LAPAROSCOPIC RIGHT COLECTOMY ;  Surgeon: [name], MD;  Location: [facility];  Service: General;  Laterality: Right;  . Salpingoophorectomy  10/22/2013    Procedure: Marquette Saa;  Surgeon: [name]. [name], MD;  Location: [facility];  Service: Gynecology;;  . Abdominal hysterectomy      vaginal- partial  . Esophagogastroduodenoscopy N/A 10/14/2015    Procedure: ESOPHAGOGASTRODUODENOSCOPY  (EGD);  Surgeon: Clarene Essex, MD;  Location: [facility];  Service: Endoscopy;  Laterality: N/A;  Bedside in ICU    There were no vitals filed for this visit.      Subjective Assessment - 02/01/16 1537    Subjective Reports not walking with quad cane due to could not figure out how to turn around.     Patient is accompained by: Family member   Pertinent History GBS February 2017, history of DVT in L calf, CVA with L sided numbness (no reported weakness); history of cancer   Limitations Standing;Walking;House hold activities   Patient Stated Goals Pt's goal is to be able to walk without assistive device   Currently in Pain? Yes   Pain Score 7    Pain Location Foot   Pain Orientation Right;Left   Pain Descriptors / Indicators Tingling;Burning   Pain Type Chronic pain   Pain Onset More than a month ago   Pain Frequency Constant   Aggravating Factors  doing more with hands and feet   Pain Relieving Factors pain meds                         [facility] Adult PT Treatment/Exercise - 02/01/16 1530    Ambulation/Gait   Ambulation/Gait Yes   Ambulation/Gait Assistance 4: Min  guard;4: Min assist   Ambulation/Gait Assistance Details Continued gait training with use of SPC over indoor surfaces, negotiating obstacles (see NMR/balance section) and over paved outdoor surfaces to increase independence and confidence with SPC.  Performed approx 300' over outdoor surfaces at min/guard to min A level with continued cues for looking up, sequencing and increasing L arm swing during gait.  Also requires cues for improved breath control as she tends to hold her breath during challenging tasks.     Ambulation Distance (Feet) 300 Feet  outdoors and another 245' indoors (not including balance)   Assistive device Straight cane   Gait Pattern Step-through pattern;Decreased arm swing - left;Decreased step length - right;Decreased step length - left;Decreased stance time - left;Decreased trunk rotation    Ambulation Surface Level;Unlevel;Indoor;Outdoor;Paved   Neuro Re-ed    Neuro Re-ed Details  Performed gait with SPC while negotiating obstacles.  Had pt ambulate in figure 8 pattern, over orange barriers, and around cones.  Note that BOS tends to decrease through tight areas therefore educated on side stepping to increase safety and maintain increased BOS.  Also had pt ambulate while carrying cup of water in L hand to increase functionality of task.  Pt tends to ambulate very guarded and keeps L hand on L leg during gait.  Ended session with gait over blue and red therapy mats to increase balance challenge and better simulate outdoor surfaces.  tolerated well with min/guard to min A.                  PT Education - 02/01/16 1603    Education provided Yes   Education Details Educated to begin ambulation with cane at home with CGA from daughter with safety belt (regular belt).    Person(s) Educated Patient;Child(ren)   Methods Explanation   Comprehension Verbalized understanding          PT Short Term Goals - 01/19/16 0931    PT SHORT TERM GOAL #1   Title Pt will perform HEP with family supervision for improved strength, ROM, balance and gait.  TARGET 02/05/16   Time 4   Period Weeks   Status New   PT SHORT TERM GOAL #2   Title Pt will improve TUG score to less than or equal to 50 seconds for decreased fall risk.   Time 4   Period Weeks   Status New   PT SHORT TERM GOAL #3   Title Berg Balance test to be assessed, with Berg score improving by at least 5 points.   Baseline 15/56 on 01/18/16   Time 4   Period Weeks   Status New   PT SHORT TERM GOAL #4   Title Pt will improve gait velocity to at least 1 ft/sec for improved gait efficiency and safety.   Time 4   Period Weeks   Status New   PT SHORT TERM GOAL #5   Title Pt will perform sit<>stand transfers, modified independently, for improved transfer safety and efficiency.   Time 4   Period Weeks           PT Long  Term Goals - 01/07/16 1157    PT LONG TERM GOAL #1   Title Pt will verbalize understanding of fall prevention within the home environment.  TARGET 03/06/16   Time 8   Period Weeks   Status New   PT LONG TERM GOAL #2   Title Pt will improve TUG score to less than or equal to 40 seconds for  decreased fall risk.   Time 8   Period Weeks   Status New   PT LONG TERM GOAL #3   Title Pt will improve Berg Balance score by at least 15 points for decreased fall risk.   Time 8   Period Weeks   Status New   PT LONG TERM GOAL #4   Title Pt will improve gait velocity to 1.5 ft/sec for improved gait efficiency and safety.   Time 8   Period Weeks   Status New               Plan - 02/01/16 1539    Clinical Impression Statement Skilled session focused on gait traiing with SPC over indoor surfaces, negotiating over and around obstacles, and gait over paved outdoor surfaces.  Educated to begin gait at home with [facility] with CGA via safety belt (regular belt) from daughter for safety.  Both verbalized understanding.    Rehab Potential Good   PT Frequency 3x / week   PT Duration 8 weeks  plus eval   PT Treatment/Interventions ADLs/Self Care Home Management;Therapeutic exercise;Therapeutic activities;Functional mobility training;Gait training;DME Instruction;Balance training;Neuromuscular re-education;Patient/family education;Orthotic Fit/Training   PT Next Visit Plan balance activities; continue gait with SPC-indoors, outdoors (paved), start to check STG's (can we go down to 2x/wk)   Consulted and Agree with Plan of Care Patient;Family member/caregiver   Family Member Consulted daughter      Patient will benefit from skilled therapeutic intervention in order to improve the following deficits and impairments:  Abnormal gait, Decreased activity tolerance, Decreased balance, Decreased mobility, Decreased endurance, Decreased range of motion, Decreased safety awareness, Decreased strength, Difficulty  walking, Impaired sensation, Pain  Visit Diagnosis: Other abnormalities of gait and mobility  Muscle weakness (generalized)  Unsteadiness on feet     Problem List Patient Active Problem List   Diagnosis Date Noted  . Poor appetite   . Neurogenic bladder   . Adjustment disorder with mixed anxiety and depressed mood   . Quadriplegia and quadriparesis ([place])   . Numbness and tingling   . Paresthesias   . UTI (lower urinary tract infection)   . History of colon cancer   . Acute blood loss anemia   . Upper GI bleed   . GBS (Guillain-Barre syndrome) ([place])   . Aspiration into airway   . Endotracheally intubated   . Acute respiratory failure ([place])   . Shock ([place]) 10/09/2015  . Hemoptysis   . Transverse myelitis ([place])   . Peripheral neuropathy ([place]) 10/06/2015  . Paresthesia of both hands 10/06/2015  . Paresthesia of both feet 10/06/2015  . Cerebral embolism with cerebral infarction 10/06/2015  . Ataxia   . Colon carcinoma metastatic to liver ([place]) 09/25/2015  . Metastasis to liver ([place]) 08/28/2015  . Colon cancer metastasized to liver ([place])   . Liver lesion   . Cerebral infarction due to unspecified mechanism 01/29/2015  . B12 deficiency 01/29/2015  . Essential hypertension 01/29/2015  . Arm numbness left   . Disorientation   . Numbness and tingling of left side of face   . TIA (transient ischemic attack) 01/11/2015  . Confusion   . Left arm numbness   . DVT, lower extremity, distal ([place]) 10/27/2013  . T4a, N0 09/27/2013  . Iron deficiency anemia 09/04/2013  . Symptomatic anemia 09/03/2013  . Orthostasis 09/03/2013  . Near syncope 09/03/2013    Cameron Sprang, PT, MPT [facility] [address] [place], [place], [address] Phone: [phone]  Fax:  [phone] 02/01/2016, 4:42 PM  Name: Cynthia [name] MRN: [id number] Date of Birth: Dec 05, 1935

## 2016-02-02 ENCOUNTER — Ambulatory Visit (HOSPITAL_COMMUNITY)
Admission: RE | Admit: 2016-02-02 | Discharge: 2016-02-02 | Disposition: A | Discharge status: Home / Self Care | Payer: Medicare Other | Source: Ambulatory Visit | Attending: Interventional Radiology | Admitting: Interventional Radiology

## 2016-02-02 DIAGNOSIS — C787 Secondary malignant neoplasm of liver and intrahepatic bile duct: Secondary | ICD-10-CM | POA: Insufficient documentation

## 2016-02-02 DIAGNOSIS — R932 Abnormal findings on diagnostic imaging of liver and biliary tract: Secondary | ICD-10-CM | POA: Insufficient documentation

## 2016-02-02 MED ORDER — GADOBENATE DIMEGLUMINE 529 MG/ML IV SOLN
15.0000 mL | Freq: Once | INTRAVENOUS | Status: AC | PRN
  Administered 2016-02-02: 12 mL via INTRAVENOUS

## 2016-02-03 ENCOUNTER — Encounter: Payer: Self-pay | Admitting: Physical Medicine & Rehabilitation

## 2016-02-03 ENCOUNTER — Encounter: Payer: Medicare Other | Attending: Physical Medicine & Rehabilitation | Admitting: Physical Medicine & Rehabilitation

## 2016-02-03 ENCOUNTER — Telehealth: Payer: Self-pay

## 2016-02-03 VITALS — BP 150/87 | HR 79

## 2016-02-03 DIAGNOSIS — C189 Malignant neoplasm of colon, unspecified: Secondary | ICD-10-CM | POA: Insufficient documentation

## 2016-02-03 DIAGNOSIS — G610 Guillain-Barre syndrome: Secondary | ICD-10-CM | POA: Insufficient documentation

## 2016-02-03 DIAGNOSIS — Z79899 Other long term (current) drug therapy: Secondary | ICD-10-CM | POA: Diagnosis not present

## 2016-02-03 DIAGNOSIS — Z8541 Personal history of malignant neoplasm of cervix uteri: Secondary | ICD-10-CM | POA: Diagnosis not present

## 2016-02-03 DIAGNOSIS — G8250 Quadriplegia, unspecified: Secondary | ICD-10-CM | POA: Diagnosis not present

## 2016-02-03 DIAGNOSIS — R269 Unspecified abnormalities of gait and mobility: Secondary | ICD-10-CM | POA: Diagnosis not present

## 2016-02-03 DIAGNOSIS — I824Z9 Acute embolism and thrombosis of unspecified deep veins of unspecified distal lower extremity: Secondary | ICD-10-CM | POA: Insufficient documentation

## 2016-02-03 DIAGNOSIS — Z86718 Personal history of other venous thrombosis and embolism: Secondary | ICD-10-CM | POA: Insufficient documentation

## 2016-02-03 DIAGNOSIS — R011 Cardiac murmur, unspecified: Secondary | ICD-10-CM | POA: Diagnosis not present

## 2016-02-03 DIAGNOSIS — N319 Neuromuscular dysfunction of bladder, unspecified: Secondary | ICD-10-CM | POA: Diagnosis not present

## 2016-02-03 DIAGNOSIS — Z8673 Personal history of transient ischemic attack (TIA), and cerebral infarction without residual deficits: Secondary | ICD-10-CM | POA: Insufficient documentation

## 2016-02-03 DIAGNOSIS — I825Z9 Chronic embolism and thrombosis of unspecified deep veins of unspecified distal lower extremity: Secondary | ICD-10-CM | POA: Diagnosis not present

## 2016-02-03 DIAGNOSIS — Z85038 Personal history of other malignant neoplasm of large intestine: Secondary | ICD-10-CM | POA: Insufficient documentation

## 2016-02-03 DIAGNOSIS — Z90710 Acquired absence of both cervix and uterus: Secondary | ICD-10-CM | POA: Insufficient documentation

## 2016-02-03 DIAGNOSIS — I10 Essential (primary) hypertension: Secondary | ICD-10-CM | POA: Diagnosis not present

## 2016-02-03 DIAGNOSIS — D62 Acute posthemorrhagic anemia: Secondary | ICD-10-CM | POA: Insufficient documentation

## 2016-02-03 DIAGNOSIS — C787 Secondary malignant neoplasm of liver and intrahepatic bile duct: Secondary | ICD-10-CM

## 2016-02-03 DIAGNOSIS — R202 Paresthesia of skin: Secondary | ICD-10-CM | POA: Insufficient documentation

## 2016-02-03 DIAGNOSIS — Z993 Dependence on wheelchair: Secondary | ICD-10-CM | POA: Insufficient documentation

## 2016-02-03 MED ORDER — PANTOPRAZOLE SODIUM 40 MG PO TBEC: 40.0000 mg | DELAYED_RELEASE_TABLET | Freq: Every day | ORAL | Status: DC

## 2016-02-03 NOTE — Telephone Encounter (Signed)
-----   Message from Cynthia Pier, MD sent at 02/02/2016  8:57 PM EDT ----- Please call patient, MRI shows no evidence of cancer, f/u as scheduled

## 2016-02-03 NOTE — Progress Notes (Signed)
Subjective:    Patient ID: Cynthia [name], female    DOB: 1936/02/24, 80 y.o.   MRN: [id number]  HPI   Cynthia [name] is here in follow up of her GBS and associated pain and gait deficits. She continues to progress with mobility. She is walking with walker on her own and is using a cane with PT. She hasnt required an AFO due to sufficient recovery of her ankle control. Her pain levels are improving. Her bladder is emptying normally now.  She had a visit with oncology last month and her cancer is in remission!   Pain Inventory Average Pain 7 Pain Right Now 0 My pain is burning and tingling  In the last 24 hours, has pain interfered with the following? General activity 0 Relation with others 0 Enjoyment of life 0 What TIME of day is your pain at its worst? . Sleep (in general) Good  Pain is worse with: walking and some activites Pain improves with: medication Relief from Meds: 3  Mobility use a cane use a walker how many minutes can you walk? 20 use a wheelchair transfers alone  Function I need assistance with the following:  household duties and shopping Do you have any goals in this area?  yes  Neuro/Psych weakness tingling trouble walking  Prior Studies Any changes since last visit?  no  Physicians involved in your care Any changes since last visit?  no   Family History  Problem Relation Age of Onset  . Diabetes Sister   . Cancer Sister     leukemia  . Diabetes Brother   . Heart failure Brother   . Hypertension Brother   . Breast cancer Maternal Aunt   . Rheum arthritis Mother   . Heart failure Father   . Cancer Maternal Grandmother     GIST  . Heart failure Maternal Grandfather   . Aneurysm Paternal Grandmother    Social History   Social History  . Marital Status: Widowed    Spouse Name: N/A  . Number of Children: N/A  . Years of Education: N/A   Social History Main Topics  . Smoking status: Never Smoker   . Smokeless tobacco:  Never Used  . Alcohol Use: No  . Drug Use: No  . Sexual Activity: No   Other Topics Concern  . None   Social History Narrative   Past Surgical History  Procedure Laterality Date  . Back surgery  1981    lower lumbar  . Tubal ligation  1968  . Laparoscopic right colectomy Right 10/22/2013    Procedure: LAPAROSCOPIC RIGHT COLECTOMY ;  Surgeon: [name], MD;  [facility];  Service: General;  Laterality: Right;  . Salpingoophorectomy  10/22/2013    Procedure: Marquette Saa;  Surgeon: [name]. [name], MD;  Location: [facility];  Service: Gynecology;;  . Abdominal hysterectomy      vaginal- partial  . Esophagogastroduodenoscopy N/A 10/14/2015    Procedure: ESOPHAGOGASTRODUODENOSCOPY (EGD);  Surgeon: Clarene Essex, MD;  [facility] END[facility];  Service: Endoscopy;  Laterality: N/A;  Bedside in ICU   Past Medical History  Diagnosis Date  . Anemia   . Blood transfusion without reported diagnosis jan 2015  . Heart murmur   . Hypertension     no bp meds   . Liver cyst   . C. difficile colitis 03/05/14, 03/22/14, 04/05/14    recurrent  . Stroke ([place]) 12/2014    NUMBNESS ON LEFT SIDE  . Cervical cancer ([place])  1972  . Colon cancer ([place]) JAN 2015  . Colon cancer ([place])   . Cancer ([place])     liver  . Complication of anesthesia     slow to awaken in past, DONE WELL RECENTLY  . Tinnitus of both ears ALL THE TIME  . DVT (deep venous thrombosis) ([facility]) 10/24/13    Right leg   BP 150/87 mmHg  Pulse 79  SpO2 95%  Opioid Risk Score:   Fall Risk Score:  `1  Depression screen PHQ 2/9  Depression screen PHQ 2/9 12/23/2015  Decreased Interest 0  Down, Depressed, Hopeless 0  PHQ - 2 Score 0     Review of Systems  Constitutional: Negative.   HENT: Negative.   Eyes: Negative.   Respiratory: Negative.   Cardiovascular: Negative.   Gastrointestinal: Negative.   Endocrine: Negative.   Genitourinary: Negative.   Musculoskeletal: Negative.   Skin: Negative.     Allergic/Immunologic: Negative.   Neurological: Negative.   Hematological: Negative.   Psychiatric/Behavioral: Negative.        Objective:   Physical Exam  HEENT: Normocephalic. Atraumatic.  Cardio: RRR  Resp: CTA B/L  GI: BS positive and NT, ND  Skin: Intact. Warm and dry.  Neuro: Alert/Oriented  Motor 5/5 B/l UE now---B/l LE 4-/5 hip flexion, 4/5 knee extension, Right ADF 2/5, right APF 4, Left ADF 3+ and left APF 4/5. Decreased LT and PP in stocking glove distribution bilateral arms/hand and legs/feet. Able to clear feet easily using walker. Cannot stand on heels Musc/Skel: No tenderness. No edema  Gen: NAD. Vital signs reviewed.  Psych: Mood, affect and behavior appropriate.   Assessment & Plan:   1. Quadriplegia, numbness, gait instability secondary to GBS  -now at home and has made nice progress!!  -CONTINUE outpt PT to address mobility. Progress to cane -discussed water walking program also 2. DVT Prophylaxis/Anticoagulation:   -dc xarelto after expiration of current rx--no longer indicated due to clot location/size and current mobility, GI risk etc. She has had 3 months of therapy. Will defer repeat doppler 3. Pain Management: Tylenol prn.  -gabapentin 400mg  tid   4. Neurogenic bladder-resolved     5. Colon cancer with liver mets: - per Dr. Benay Spice ----in clinical remission!! 6 UGIB with ABLA: H/H stable.  protonix refilled    Thirty minutes of face to face patient care time were spent during this visit. All questions were encouraged and answered.  Follow up with me in about 3 months

## 2016-02-03 NOTE — Telephone Encounter (Signed)
Called and informed patient of MRI result, informed pt to follow up as scheduled. Patient verbalized understanding and appreciative of call.

## 2016-02-03 NOTE — Patient Instructions (Signed)
CONTINUE TO WORK ON YOUR BALANCE AND RANGE OF MOTION.

## 2016-02-04 ENCOUNTER — Ambulatory Visit: Payer: Medicare Other | Admitting: Rehabilitation

## 2016-02-04 ENCOUNTER — Encounter: Payer: Self-pay | Admitting: Rehabilitation

## 2016-02-04 DIAGNOSIS — G8220 Paraplegia, unspecified: Secondary | ICD-10-CM

## 2016-02-04 DIAGNOSIS — R2689 Other abnormalities of gait and mobility: Secondary | ICD-10-CM

## 2016-02-04 DIAGNOSIS — M6281 Muscle weakness (generalized): Secondary | ICD-10-CM

## 2016-02-04 DIAGNOSIS — R2681 Unsteadiness on feet: Secondary | ICD-10-CM

## 2016-02-05 ENCOUNTER — Ambulatory Visit: Payer: Medicare Other | Admitting: Rehabilitation

## 2016-02-05 ENCOUNTER — Encounter: Payer: Self-pay | Admitting: Rehabilitation

## 2016-02-05 DIAGNOSIS — M6281 Muscle weakness (generalized): Secondary | ICD-10-CM

## 2016-02-05 DIAGNOSIS — R2689 Other abnormalities of gait and mobility: Secondary | ICD-10-CM

## 2016-02-05 DIAGNOSIS — R2681 Unsteadiness on feet: Secondary | ICD-10-CM

## 2016-02-05 NOTE — Patient Instructions (Addendum)
Abduction: Clam (Eccentric) - Side-Lying    Lie on side with knees bent. Lift top knee, keeping feet together. Keep trunk steady. Slowly lower for 3-5 seconds. _10__ reps per set, __1-2_ sets per day, _5-7__ days per week.  Use Red theraband around knees to increase resistance.     Chair Pose    Stand with legs hip-width apart. Inhaling, bend knees trying to keep knees over ankles, as if to sit on a chair and extend arms in front, wrists slightly lower than shoulders. Hold position for _2__ breaths. Exhaling, stand up. Repeat _10__ times. Do _1-2__ times per day.  Copyright  VHI. All rights reserved.    ABDUCTION: Standing (Active)    Hold onto walker or counter for support. Stand, feet flat. Lift right leg out to side. Repeat 10 times on each leg once a day.  Add RED theraband to ankles to increase challenge.    HIP / KNEE: Extension - Standing    Use walker or counter for support. Squeeze glutes. Raise and lift leg backward. Keep knee straight or slightly bent.  Add RED band to increase challenge.   Repeat 10 times each side once a day.  Copyright  VHI. All rights reserved.   "I love a [employer] your walker or the counter. March in place raising knees as hight as possible. Repeat 10 times. Do 1 sessions per day.  Add RED band to increase challenge.  Step on one side of band while the other is around the opposite ankle and raise the side opposite the one you are standing on.    Balance:  Stand in corner with chair in front of you for support.    Feet Together, Arm Motion - Eyes Closed    With eyes closed and feet together, keep arms by your side.  Start with your feet slightly apart and work towards bringing your feet all the way together.   Repeat __3__ times per session of 15 seconds each. Do _1-2___ sessions per day.  Copyright  VHI. All rights reserved.   Feet Apart (Compliant Surface) Arm Motion - Eyes Open    With eyes open, standing on  compliant surface: ___stacked pillows or cushion_____, feet shoulder width apart, keep arms by your side.   Repeat __3__ times per session for 30 seconds each. Do _1-2___ sessions per day.  Copyright  VHI. All rights reserved.   Feet Apart (Compliant Surface) Arm Motion - Eyes Closed    Stand on compliant surface: ___stacked pillows or cushion_____, feet shoulder width apart. Close eyes and keep arms by your side.  Repeat __3__ times per session for 30 seconds each. Do _1-2___ sessions per day.  Copyright  VHI. All rights reserved.

## 2016-02-05 NOTE — Therapy (Signed)
[facility] [address] [place], [place], [address] Phone: [phone]   Fax:  [phone]  Physical Therapy Treatment  Patient Details  Name: Cynthia [name] MRN: [id number] Date of Birth: 05-18-1936 Referring Provider: Naaman Plummer  Encounter Date: 02/04/2016      PT End of Session - 02/05/16 0811    Visit Number 9   Number of Visits 25   Date for PT Re-Evaluation 03/06/16   Authorization Type UHC Medicare-G-code every 10th visit   PT Start Time 1533   PT Stop Time 1617   PT Time Calculation (min) 44 min   Equipment Utilized During Treatment Gait belt   Activity Tolerance Patient tolerated treatment well   Behavior During Therapy [facility] for tasks assessed/performed      Past Medical History  Diagnosis Date  . Anemia   . Blood transfusion without reported diagnosis jan 2015  . Heart murmur   . Hypertension     no bp meds   . Liver cyst   . C. difficile colitis 03/05/14, 03/22/14, 04/05/14    recurrent  . Stroke ([place]) 12/2014    NUMBNESS ON LEFT SIDE  . Cervical cancer ([place]) 1972  . Colon cancer ([place]) JAN 2015  . Colon cancer ([place])   . Cancer ([place])     liver  . Complication of anesthesia     slow to awaken in past, DONE WELL RECENTLY  . Tinnitus of both ears ALL THE TIME  . DVT (deep venous thrombosis) ([place]) 10/24/13    Right leg    Past Surgical History  Procedure Laterality Date  . Back surgery  1981    lower lumbar  . Tubal ligation  1968  . Laparoscopic right colectomy Right 10/22/2013    Procedure: LAPAROSCOPIC RIGHT COLECTOMY ;  Surgeon: [name], MD;  Location: [facility] ORS;  Service: General;  Laterality: Right;  . Salpingoophorectomy  10/22/2013    Procedure: Marquette Saa;  Surgeon: [name]. [name], MD;  Location: [facility]S;  Service: Gynecology;;  . Abdominal hysterectomy      vaginal- partial  . Esophagogastroduodenoscopy N/A 10/14/2015    Procedure: ESOPHAGOGASTRODUODENOSCOPY  (EGD);  Surgeon: Clarene Essex, MD;  Location: [facility] ENDOSCOPY;  Service: Endoscopy;  Laterality: N/A;  Bedside in ICU    There were no vitals filed for this visit.      Subjective Assessment - 02/04/16 1544    Subjective Reports being busy this week, no walking with cane at home yet.    Patient is accompained by: Family member   Pertinent History GBS February 2017, history of DVT in L calf, CVA with L sided numbness (no reported weakness); history of cancer   Limitations Standing;Walking;House hold activities   Patient Stated Goals Pt's goal is to be able to walk without assistive device   Currently in Pain? Yes   Pain Score 6    Pain Location Foot   Pain Orientation Right;Left   Pain Descriptors / Indicators Burning;Tingling   Pain Type Chronic pain   Pain Onset More than a month ago   Aggravating Factors  increased activity   Pain Relieving Factors pain meds                         [facility] Adult PT Treatment/Exercise - 02/04/16 1549    Ambulation/Gait   Gait velocity 16.57 sec, 2.00 ft/sec   Gait Comments Performed TUG w/ RW at 28.65 secs and then with SPC at  29.35 secs   Standardized Balance Assessment   Standardized Balance Assessment Timed Up and Go Test   Berg Balance Test   Sit to Stand Able to stand without using hands and stabilize independently   Standing Unsupported Able to stand safely 2 minutes   Sitting with Back Unsupported but Feet Supported on Floor or Stool Able to sit safely and securely 2 minutes   Stand to Sit Sits safely with minimal use of hands   Transfers Able to transfer safely, minor use of hands   Standing Unsupported with Eyes Closed Able to stand 10 seconds with supervision   Standing Ubsupported with Feet Together Able to place feet together independently and stand for 1 minute with supervision   From Standing, Reach Forward with Outstretched Arm Reaches forward but needs supervision   From Standing Position, Pick up Object from [place] to pick up shoe, needs supervision   From Standing Position, Turn to Look Behind Over each Shoulder Turn sideways only but maintains balance   Turn 360 Degrees Needs close supervision or verbal cueing   Standing Unsupported, Alternately Place Feet on Step/Stool Able to complete 4 steps without aid or supervision   Standing Unsupported, One Foot in Front Able to plae foot ahead of the other independently and hold 30 seconds   Standing on One Leg Tries to lift leg/unable to hold 3 seconds but remains standing independently   Total Score 39   Timed Up and Go Test   TUG Normal TUG   Normal TUG (seconds) 28.65                PT Education - 02/05/16 0811    Education provided Yes   Education Details Educated on progress making in PT and that pt can go down to 2x/wk following next week.    Person(s) Educated Patient;Child(ren)   Methods Explanation   Comprehension Verbalized understanding          PT Short Term Goals - 02/04/16 1559    PT SHORT TERM GOAL #1   Title Pt will perform HEP with family supervision for improved strength, ROM, balance and gait.  TARGET 02/05/16   Time 4   Period Weeks   Status New   PT SHORT TERM GOAL #2   Title Pt will improve TUG score to less than or equal to 50 seconds for decreased fall risk.   Baseline 28.65 secs on 02/04/16   Time 4   Period Weeks   Status Achieved   PT SHORT TERM GOAL #3   Title Berg Balance test to be assessed, with Berg score improving by at least 5 points.   Baseline 15/56 on 01/18/16 to 39/56 on 02/04/16   Time 4   Period Weeks   Status Achieved   PT SHORT TERM GOAL #4   Title Pt will improve gait velocity to at least 1 ft/sec for improved gait efficiency and safety.   Baseline 2.00 ft/sec on 02/04/16   Time 4   Period Weeks   Status Achieved   PT SHORT TERM GOAL #5   Title Pt will perform sit<>stand transfers, modified independently, for improved transfer safety and efficiency.   Baseline mod I 02/04/16   Time 4    Period Weeks   Status Achieved           PT Long Term Goals - 02/04/16 1600    PT LONG TERM GOAL #1   Title Pt will verbalize understanding of fall prevention within the  home environment.  TARGET 03/06/16   Time 8   Period Weeks   Status New   PT LONG TERM GOAL #2   Title Pt will improve TUG score to less than or equal to 18 seconds for decreased fall risk.   Baseline 28.65 secs on 02/04/16, therefore updated goal   Time 8   Period Weeks   Status Revised   PT LONG TERM GOAL #3   Title Pt will improve Berg Balance score to 44/56  for decreased fall risk.   Baseline updated 02/04/16 due to progress.    Time 8   Period Weeks   Status Revised   PT LONG TERM GOAL #4   Title Pt will improve gait velocity to 2.62 ft/sec for improved gait efficiency and safety.   Baseline 2.00 ft/sec on 02/04/16, therefore upgraded   Time 8   Period Weeks   Status Revised               Plan - 02/05/16 2423    Clinical Impression Statement Skilled session focused on addressing STG's.  Pt has met 4/5 STG's as we did not have time to address HEP during this visit.  Will address on next visit.  Pt making marked progress and updated LTG's based on progress. Pt to come to therapy 3x/wk next week and then transition to 2x/wk for last 4 weeks of therapy.  Pt and daughter verbalized understanding.     Rehab Potential Good   PT Frequency 3x / week   PT Duration 8 weeks  plus eval   PT Treatment/Interventions ADLs/Self Care Home Management;Therapeutic exercise;Therapeutic activities;Functional mobility training;Gait training;DME Instruction;Balance training;Neuromuscular re-education;Patient/family education;Orthotic Fit/Training   PT Next Visit Plan balance activities; continue gait with SPC-indoors, outdoors (paved), start to check STG's (can we go down to 2x/wk)   Consulted and Agree with Plan of Care Patient;Family member/caregiver   Family Member Consulted daughter      Patient will benefit  from skilled therapeutic intervention in order to improve the following deficits and impairments:  Abnormal gait, Decreased activity tolerance, Decreased balance, Decreased mobility, Decreased endurance, Decreased range of motion, Decreased safety awareness, Decreased strength, Difficulty walking, Impaired sensation, Pain  Visit Diagnosis: Other abnormalities of gait and mobility  Muscle weakness (generalized)  Unsteadiness on feet  Paraparesis of both lower limbs (HCC)     Problem List Patient Active Problem List   Diagnosis Date Noted  . Poor appetite   . Neurogenic bladder   . Adjustment disorder with mixed anxiety and depressed mood   . Quadriplegia and quadriparesis ([place])   . Numbness and tingling   . Paresthesias   . UTI (lower urinary tract infection)   . History of colon cancer   . Acute blood loss anemia   . Upper GI bleed   . GBS (Guillain-Barre syndrome) ([place])   . Aspiration into airway   . Endotracheally intubated   . Acute respiratory failure ([place])   . Shock ([place]) 10/09/2015  . Hemoptysis   . Transverse myelitis ([place])   . Peripheral neuropathy ([place]) 10/06/2015  . Paresthesia of both hands 10/06/2015  . Paresthesia of both feet 10/06/2015  . Cerebral embolism with cerebral infarction 10/06/2015  . Ataxia   . Colon carcinoma metastatic to liver ([place]) 09/25/2015  . Metastasis to liver ([place]) 08/28/2015  . Colon cancer metastasized to liver ([place])   . Liver lesion   . Cerebral infarction due to unspecified mechanism 01/29/2015  . B12 deficiency 01/29/2015  . Essential hypertension  01/29/2015  . Arm numbness left   . Disorientation   . Numbness and tingling of left side of face   . TIA (transient ischemic attack) 01/11/2015  . Confusion   . Left arm numbness   . DVT, lower extremity, distal ([place]) 10/27/2013  . T4a, N0 09/27/2013  . Iron deficiency anemia 09/04/2013  . Symptomatic anemia 09/03/2013  . Orthostasis 09/03/2013  . Near syncope 09/03/2013     Cameron Sprang, PT, MPT [facility] [address] [place], [place], [address] Phone: [phone]   Fax:  [phone] 02/05/2016, 8:28 AM  Name: ELANDRA POWELL MRN: [id number] Date of Birth: [birth year]

## 2016-02-05 NOTE — Therapy (Signed)
[facility] [address] [place], [place], [address] Phone: [phone]   Fax:  [phone]  Physical Therapy Treatment  Patient Details  Name: Cynthia [name] MRN: [id number] Date of Birth: Jul 01, 1936 Referring Provider: Naaman Plummer  Encounter Date: 02/05/2016      PT End of Session - 02/05/16 1618    Visit Number 10   Number of Visits 25   Date for PT Re-Evaluation 03/06/16   Authorization Type UHC Medicare-G-code every 10th visit   PT Start Time 1530   PT Stop Time 1615   PT Time Calculation (min) 45 min   Equipment Utilized During Treatment Gait belt   Activity Tolerance Patient tolerated treatment well   Behavior During Therapy [facility] for tasks assessed/performed      Past Medical History  Diagnosis Date  . Anemia   . Blood transfusion without reported diagnosis jan 2015  . Heart murmur   . Hypertension     no bp meds   . Liver cyst   . C. difficile colitis 03/05/14, 03/22/14, 04/05/14    recurrent  . Stroke ([place]) 12/2014    NUMBNESS ON LEFT SIDE  . Cervical cancer ([place]) 1972  . Colon cancer ([place]) JAN 2015  . Colon cancer ([place])   . Cancer ([place])     liver  . Complication of anesthesia     slow to awaken in past, DONE WELL RECENTLY  . Tinnitus of both ears ALL THE TIME  . DVT (deep venous thrombosis) ([place]) 10/24/13    Right leg    Past Surgical History  Procedure Laterality Date  . Back surgery  1981    lower lumbar  . Tubal ligation  1968  . Laparoscopic right colectomy Right 10/22/2013    Procedure: LAPAROSCOPIC RIGHT COLECTOMY ;  Surgeon: [name], MD;  Location[facility];  Service: General;  Laterality: Right;  . Salpingoophorectomy  10/22/2013    Procedure: Marquette Saa;  Surgeon: [name]. [name], MD;  Location: [facility];  Service: Gynecology;;  . Abdominal hysterectomy      vaginal- partial  . Esophagogastroduodenoscopy N/A 10/14/2015    Procedure: ESOPHAGOGASTRODUODENOSCOPY  (EGD);  Surgeon: Clarene Essex, MD;  Location[facility] ENDOSCOPY;  Service: Endoscopy;  Laterality: N/A;  Bedside in ICU    There were no vitals filed for this visit.      Subjective Assessment - 02/05/16 1533    Subjective Reports no changes since yesterday, no falls.    Patient is accompained by: Family member   Pertinent History GBS February 2017, history of DVT in L calf, CVA with L sided numbness (no reported weakness); history of cancer   Limitations Standing;Walking;House hold activities   Patient Stated Goals Pt's goal is to be able to walk without assistive device   Currently in Pain? Yes   Pain Score 6    Pain Location Foot   Pain Orientation Right;Left   Pain Descriptors / Indicators Burning;Tingling   Pain Type Chronic pain   Pain Onset More than a month ago   Pain Frequency Constant                                 PT Education - 02/05/16 1535    Education provided Yes   Education Details Educated on updated HEP   Person(s) Educated Patient;Child(ren)   Methods Explanation;Demonstration;Handout   Comprehension Verbalized understanding;Returned demonstration  PT Short Term Goals - 02/05/16 1622    PT SHORT TERM GOAL #1   Title Pt will perform HEP with family supervision for improved strength, ROM, balance and gait.  TARGET 02/05/16   Baseline met 02/05/16   Time 4   Period Weeks   Status Achieved   PT SHORT TERM GOAL #2   Title Pt will improve TUG score to less than or equal to 50 seconds for decreased fall risk.   Baseline 28.65 secs on 02/04/16   Time 4   Period Weeks   Status Achieved   PT SHORT TERM GOAL #3   Title Berg Balance test to be assessed, with Berg score improving by at least 5 points.   Baseline 15/56 on 01/18/16 to 39/56 on 02/04/16   Time 4   Period Weeks   Status Achieved   PT SHORT TERM GOAL #4   Title Pt will improve gait velocity to at least 1 ft/sec for improved gait efficiency and safety.   Baseline 2.00  ft/sec on 02/04/16   Time 4   Period Weeks   Status Achieved   PT SHORT TERM GOAL #5   Title Pt will perform sit<>stand transfers, modified independently, for improved transfer safety and efficiency.   Baseline mod I 02/04/16   Time 4   Period Weeks   Status Achieved           PT Long Term Goals - 02/04/16 1600    PT LONG TERM GOAL #1   Title Pt will verbalize understanding of fall prevention within the home environment.  TARGET 03/06/16   Time 8   Period Weeks   Status New   PT LONG TERM GOAL #2   Title Pt will improve TUG score to less than or equal to 18 seconds for decreased fall risk.   Baseline 28.65 secs on 02/04/16, therefore updated goal   Time 8   Period Weeks   Status Revised   PT LONG TERM GOAL #3   Title Pt will improve Berg Balance score to 44/56  for decreased fall risk.   Baseline updated 02/04/16 due to progress.    Time 8   Period Weeks   Status Revised   PT LONG TERM GOAL #4   Title Pt will improve gait velocity to 2.62 ft/sec for improved gait efficiency and safety.   Baseline 2.00 ft/sec on 02/04/16, therefore upgraded   Time 8   Period Weeks   Status Revised               Plan - 02/05/16 1619    Clinical Impression Statement Skilled session focused on addressing remaining LTG of HEP.  Pt able to state exercises, however states that many of them are too easy, therefore updated and increased challenge, see pt instruction.     Rehab Potential Good   PT Frequency 3x / week   PT Duration 8 weeks  plus eval   PT Treatment/Interventions ADLs/Self Care Home Management;Therapeutic exercise;Therapeutic activities;Functional mobility training;Gait training;DME Instruction;Balance training;Neuromuscular re-education;Patient/family education;Orthotic Fit/Training   PT Next Visit Plan continue with more high level balance activities   Consulted and Agree with Plan of Care Patient;Family member/caregiver   Family Member Consulted daughter      Patient  will benefit from skilled therapeutic intervention in order to improve the following deficits and impairments:  Abnormal gait, Decreased activity tolerance, Decreased balance, Decreased mobility, Decreased endurance, Decreased range of motion, Decreased safety awareness, Decreased strength, Difficulty walking, Impaired sensation, Pain  Visit Diagnosis: Other abnormalities of gait and mobility  Muscle weakness (generalized)  Unsteadiness on feet       G-Codes - 2016/02/10 1626    Functional Assessment Tool Used gait speed 2.0 ft/sec, BERG 39/56   Functional Limitation Mobility: Walking and moving around   Mobility: Walking and Moving Around Current Status [phone]) At least 20 percent but less than 40 percent impaired, limited or restricted   Mobility: Walking and Moving Around Goal Status [phone]) At least 1 percent but less than 20 percent impaired, limited or restricted      Physical Therapy Progress Note  Dates of Reporting Period: 01/07/16 to 10-Feb-2016  Objective Reports of Subjective Statement: See above  Objective Measurements: BERG 39/56  Goal Update: See revised upgraded goals above  Plan: Continue POC  Reason Skilled Services are Required: Pt continues to have generalized weakness, sensory changes, and decreased balance.  Pt making excellent progress with all aspects of mobility and balance.        Problem List Patient Active Problem List   Diagnosis Date Noted  . Poor appetite   . Neurogenic bladder   . Adjustment disorder with mixed anxiety and depressed mood   . Quadriplegia and quadriparesis ([place])   . Numbness and tingling   . Paresthesias   . UTI (lower urinary tract infection)   . History of colon cancer   . Acute blood loss anemia   . Upper GI bleed   . GBS (Guillain-Barre syndrome) ([place])   . Aspiration into airway   . Endotracheally intubated   . Acute respiratory failure ([place])   . Shock ([place]) 10/09/2015  . Hemoptysis   . Transverse myelitis ([place])    . Peripheral neuropathy ([place]) 10/06/2015  . Paresthesia of both hands 10/06/2015  . Paresthesia of both feet 10/06/2015  . Cerebral embolism with cerebral infarction 10/06/2015  . Ataxia   . Colon carcinoma metastatic to liver ([place]) 09/25/2015  . Metastasis to liver ([place]) 08/28/2015  . Colon cancer metastasized to liver ([place])   . Liver lesion   . Cerebral infarction due to unspecified mechanism 01/29/2015  . B12 deficiency 01/29/2015  . Essential hypertension 01/29/2015  . Arm numbness left   . Disorientation   . Numbness and tingling of left side of face   . TIA (transient ischemic attack) 01/11/2015  . Confusion   . Left arm numbness   . DVT, lower extremity, distal ([place]) 10/27/2013  . T4a, N0 09/27/2013  . Iron deficiency anemia 09/04/2013  . Symptomatic anemia 09/03/2013  . Orthostasis 09/03/2013  . Near syncope 09/03/2013    Cameron Sprang, PT, MPT [facility] [address] [place], [place], [address] Phone: [phone]   Fax:  [phone] 02/10/16, 4:27 PM  Name: Cynthia [name] MRN: [id number] Date of Birth: 11-27-1935

## 2016-02-08 ENCOUNTER — Ambulatory Visit: Payer: Medicare Other | Admitting: Rehabilitation

## 2016-02-08 ENCOUNTER — Encounter: Payer: Self-pay | Admitting: Rehabilitation

## 2016-02-08 DIAGNOSIS — R2681 Unsteadiness on feet: Secondary | ICD-10-CM

## 2016-02-08 DIAGNOSIS — R2689 Other abnormalities of gait and mobility: Secondary | ICD-10-CM | POA: Diagnosis not present

## 2016-02-08 DIAGNOSIS — M6281 Muscle weakness (generalized): Secondary | ICD-10-CM

## 2016-02-08 NOTE — Patient Instructions (Signed)
Fall Prevention in the Home  Falls can cause injuries and can affect people from all age groups. There are many simple things that you can do to make your home safe and to help prevent falls. WHAT CAN I DO ON THE OUTSIDE OF MY HOME?  Regularly repair the edges of walkways and driveways and fix any cracks.  Remove high doorway thresholds.  Trim any shrubbery on the main path into your home.  Use bright outdoor lighting.  Clear walkways of debris and clutter, including tools and rocks.  Regularly check that handrails are securely fastened and in good repair. Both sides of any steps should have handrails.  Install guardrails along the edges of any raised decks or porches.  Have leaves, snow, and ice cleared regularly.  Use sand or salt on walkways during winter months.  In the garage, clean up any spills right away, including grease or oil spills. WHAT CAN I DO IN THE BATHROOM?  Use night lights.  Install grab bars by the toilet and in the tub and shower. Do not use towel bars as grab bars.  Use non-skid mats or decals on the floor of the tub or shower.  If you need to sit down while you are in the shower, use a plastic, non-slip stool..  Keep the floor dry. Immediately clean up any water that spills on the floor.  Remove soap buildup in the tub or shower on a regular basis.  Attach bath mats securely with double-sided non-slip rug tape.  Remove throw rugs and other tripping hazards from the floor. WHAT CAN I DO IN THE BEDROOM?  Use night lights.  Make sure that a bedside light is easy to reach.  Do not use oversized bedding that drapes onto the floor.  Have a firm chair that has side arms to use for getting dressed.  Remove throw rugs and other tripping hazards from the floor. WHAT CAN I DO IN THE KITCHEN?   Clean up any spills right away.  Avoid walking on wet floors.  Place frequently used items in easy-to-reach places.  If you need to reach for something  above you, use a sturdy step stool that has a grab bar.  Keep electrical cables out of the way.  Do not use floor polish or wax that makes floors slippery. If you have to use wax, make sure that it is non-skid floor wax.  Remove throw rugs and other tripping hazards from the floor. WHAT CAN I DO IN THE STAIRWAYS?  Do not leave any items on the stairs.  Make sure that there are handrails on both sides of the stairs. Fix handrails that are broken or loose. Make sure that handrails are as long as the stairways.  Check any carpeting to make sure that it is firmly attached to the stairs. Fix any carpet that is loose or worn.  Avoid having throw rugs at the top or bottom of stairways, or secure the rugs with carpet tape to prevent them from moving.  Make sure that you have a light switch at the top of the stairs and the bottom of the stairs. If you do not have them, have them installed. WHAT ARE SOME OTHER FALL PREVENTION TIPS?  Wear closed-toe shoes that fit well and support your feet. Wear shoes that have rubber soles or low heels.  When you use a stepladder, make sure that it is completely opened and that the sides are firmly locked. Have someone hold the ladder while you   are using it. Do not climb a closed stepladder.  Add color or contrast paint or tape to grab bars and handrails in your home. Place contrasting color strips on the first and last steps.  Use mobility aids as needed, such as canes, walkers, scooters, and crutches.  Turn on lights if it is dark. Replace any light bulbs that burn out.  Set up furniture so that there are clear paths. Keep the furniture in the same spot.  Fix any uneven floor surfaces.  Choose a carpet design that does not hide the edge of steps of a stairway.  Be aware of any and all pets.  Review your medicines with your healthcare provider. Some medicines can cause dizziness or changes in blood pressure, which increase your risk of falling. Talk  with your health care provider about other ways that you can decrease your risk of falls. This may include working with a physical therapist or trainer to improve your strength, balance, and endurance.   This information is not intended to replace advice given to you by your health care provider. Make sure you discuss any questions you have with your health care provider.   Document Released: 07/22/2002 Document Revised: 12/16/2014 Document Reviewed: 09/05/2014 Elsevier Interactive Patient Education 2016 [employer]  

## 2016-02-08 NOTE — Therapy (Signed)
[facility] [address] [place], [place], [address] Phone: [phone]   Fax:  [phone]  Physical Therapy Treatment  Patient Details  Name: Cynthia [name] MRN: [id number] Date of Birth: 07-20-1936 Referring Provider: Naaman Plummer  Encounter Date: 02/08/2016      PT End of Session - 02/08/16 1536    Visit Number 11   Number of Visits 25   Date for PT Re-Evaluation 03/06/16   Authorization Type UHC Medicare-G-code every 10th visit   PT Start Time 1530   PT Stop Time 1615   PT Time Calculation (min) 45 min   Equipment Utilized During Treatment Gait belt   Activity Tolerance Patient tolerated treatment well   Behavior During Therapy [facility] for tasks assessed/performed      Past Medical History  Diagnosis Date  . Anemia   . Blood transfusion without reported diagnosis jan 2015  . Heart murmur   . Hypertension     no bp meds   . Liver cyst   . C. difficile colitis 03/05/14, 03/22/14, 04/05/14    recurrent  . Stroke ([place]) 12/2014    NUMBNESS ON LEFT SIDE  . Cervical cancer ([place]) 1972  . Colon cancer ([place]) JAN 2015  . Colon cancer ([place])   . Cancer ([place])     liver  . Complication of anesthesia     slow to awaken in past, DONE WELL RECENTLY  . Tinnitus of both ears ALL THE TIME  . DVT (deep venous thrombosis) ([place]) 10/24/13    Right leg    Past Surgical History  Procedure Laterality Date  . Back surgery  1981    lower lumbar  . Tubal ligation  1968  . Laparoscopic right colectomy Right 10/22/2013    Procedure: LAPAROSCOPIC RIGHT COLECTOMY ;  Surgeon: [name], MD;  Location: [facility];  Service: General;  Laterality: Right;  . Salpingoophorectomy  10/22/2013    Procedure: Marquette Saa;  Surgeon: [name]. [name], MD;  Location: [facility];  Service: Gynecology;;  . Abdominal hysterectomy      vaginal- partial  . Esophagogastroduodenoscopy N/A 10/14/2015    Procedure: ESOPHAGOGASTRODUODENOSCOPY  (EGD);  Surgeon: Clarene Essex, MD;  Location[facility] -  ENDOSCOPY;  Service: Endoscopy;  Laterality: N/A;  Bedside in ICU    There were no vitals filed for this visit.      Subjective Assessment - 02/08/16 1533    Subjective Reports no changes since yesterday, no falls.    Pertinent History GBS February 2017, history of DVT in L calf, CVA with L sided numbness (no reported weakness); history of cancer   Limitations Standing;Walking;House hold activities   Patient Stated Goals Pt's goal is to be able to walk without assistive device   Currently in Pain? Yes   Pain Score 7    Pain Location Foot   Pain Orientation Right;Left   Pain Descriptors / Indicators Burning;Tingling   Pain Type Chronic pain   Pain Onset More than a month ago   Pain Frequency Constant             Self Care:  Educated and went over many fall prevention strategies during session to address LTG.  Pt able to name several ways in which to avoid falls at home with PT providing others.  See pt instruction for handout.              [facility] Adult PT Treatment/Exercise - 02/08/16 1535    Ambulation/Gait   Ambulation/Gait Yes  Ambulation/Gait Assistance 5: Supervision;4: Min guard   Ambulation/Gait Assistance Details Continue to work on gait with SPC over indoor and outdoor surfaces.  Performed 345' indoors while negotiating through tight spaces and over carpet and concrete to better simulate home surfaces.  Transitioned to gait training outdoors x 500' at min/guard level.  Note no overt LOB today, pt with mild stagger, however she was able to correct without assistance.  Also ambulate over grass, mulch and gravel.  Again, tolerated well with no overt LOB but cues for upward gaze, improved arm swing on L and increased step width.  Progressed during session to gait training without device to improve mechanics further.  Cues for upright posture and improved B arm swing.  She also tends to have narrow BOS and hits with flat  foot, therefore provided cues for heel to toe contact.  Pt needing seated rest break during last bout of gait due to increased tingling and burning in feet.  Note that she was not wearing compression socks today and felt that this may be reason for increased symptoms.     Ambulation Distance (Feet) 345 Feet  then 500, 115   Assistive device Straight cane;None   Gait Pattern Step-through pattern;Decreased arm swing - left;Decreased step length - right;Decreased step length - left;Decreased stance time - left;Decreased trunk rotation   Ambulation Surface Level;Unlevel;Indoor;Outdoor;Paved;Gravel;Grass;Other (comment)  mulch                PT Education - 02/08/16 1536    Education provided Yes   Education Details provided education on fall prevention (please provide handout at next visit)   Person(s) Educated Patient   Methods Explanation   Comprehension Verbalized understanding          PT Short Term Goals - 02/05/16 1622    PT SHORT TERM GOAL #1   Title Pt will perform HEP with family supervision for improved strength, ROM, balance and gait.  TARGET 02/05/16   Baseline met 02/05/16   Time 4   Period Weeks   Status Achieved   PT SHORT TERM GOAL #2   Title Pt will improve TUG score to less than or equal to 50 seconds for decreased fall risk.   Baseline 28.65 secs on 02/04/16   Time 4   Period Weeks   Status Achieved   PT SHORT TERM GOAL #3   Title Berg Balance test to be assessed, with Berg score improving by at least 5 points.   Baseline 15/56 on 01/18/16 to 39/56 on 02/04/16   Time 4   Period Weeks   Status Achieved   PT SHORT TERM GOAL #4   Title Pt will improve gait velocity to at least 1 ft/sec for improved gait efficiency and safety.   Baseline 2.00 ft/sec on 02/04/16   Time 4   Period Weeks   Status Achieved   PT SHORT TERM GOAL #5   Title Pt will perform sit<>stand transfers, modified independently, for improved transfer safety and efficiency.   Baseline mod I  02/04/16   Time 4   Period Weeks   Status Achieved           PT Long Term Goals - 02/04/16 1600    PT LONG TERM GOAL #1   Title Pt will verbalize understanding of fall prevention within the home environment.  TARGET 03/06/16   Time 8   Period Weeks   Status New   PT LONG TERM GOAL #2   Title Pt will improve TUG  score to less than or equal to 18 seconds for decreased fall risk.   Baseline 28.65 secs on 02/04/16, therefore updated goal   Time 8   Period Weeks   Status Revised   PT LONG TERM GOAL #3   Title Pt will improve Berg Balance score to 44/56  for decreased fall risk.   Baseline updated 02/04/16 due to progress.    Time 8   Period Weeks   Status Revised   PT LONG TERM GOAL #4   Title Pt will improve gait velocity to 2.62 ft/sec for improved gait efficiency and safety.   Baseline 2.00 ft/sec on 02/04/16, therefore upgraded   Time 8   Period Weeks   Status Revised               Plan - 02/08/16 1536    Clinical Impression Statement Skilled session focused on initiation of education on fall prevention strategies and continuing to work on gait training with [place] indoors, outdoors and also gait without device in order to improve balance and quality of gait.     Rehab Potential Good   PT Frequency 3x / week   PT Duration 8 weeks  plus eval   PT Treatment/Interventions ADLs/Self Care Home Management;Therapeutic exercise;Therapeutic activities;Functional mobility training;Gait training;DME Instruction;Balance training;Neuromuscular re-education;Patient/family education;Orthotic Fit/Training   PT Next Visit Plan continue with more high level balance activities, gait with SPC and without device to increase challlenge (needs to work on heel to toe, arm swing)   Consulted and Agree with Plan of Care Patient;Family member/caregiver   Family Member Consulted daughter      Patient will benefit from skilled therapeutic intervention in order to improve the following deficits and  impairments:  Abnormal gait, Decreased activity tolerance, Decreased balance, Decreased mobility, Decreased endurance, Decreased range of motion, Decreased safety awareness, Decreased strength, Difficulty walking, Impaired sensation, Pain  Visit Diagnosis: Other abnormalities of gait and mobility  Muscle weakness (generalized)  Unsteadiness on feet     Problem List Patient Active Problem List   Diagnosis Date Noted  . Poor appetite   . Neurogenic bladder   . Adjustment disorder with mixed anxiety and depressed mood   . Quadriplegia and quadriparesis ([place])   . Numbness and tingling   . Paresthesias   . UTI (lower urinary tract infection)   . History of colon cancer   . Acute blood loss anemia   . Upper GI bleed   . GBS (Guillain-Barre syndrome) ([place])   . Aspiration into airway   . Endotracheally intubated   . Acute respiratory failure ([place])   . Shock ([place]) 10/09/2015  . Hemoptysis   . Transverse myelitis ([place])   . Peripheral neuropathy ([place]) 10/06/2015  . Paresthesia of both hands 10/06/2015  . Paresthesia of both feet 10/06/2015  . Cerebral embolism with cerebral infarction 10/06/2015  . Ataxia   . Colon carcinoma metastatic to liver ([place]) 09/25/2015  . Metastasis to liver ([place]) 08/28/2015  . Colon cancer metastasized to liver ([place])   . Liver lesion   . Cerebral infarction due to unspecified mechanism 01/29/2015  . B12 deficiency 01/29/2015  . Essential hypertension 01/29/2015  . Arm numbness left   . Disorientation   . Numbness and tingling of left side of face   . TIA (transient ischemic attack) 01/11/2015  . Confusion   . Left arm numbness   . DVT, lower extremity, distal ([place]) 10/27/2013  . T4a, N0 09/27/2013  . Iron deficiency anemia 09/04/2013  . Symptomatic  anemia 09/03/2013  . Orthostasis 09/03/2013  . Near syncope 09/03/2013    Cameron Sprang, PT, MPT [facility] [address] [place], [place],  [address] Phone: [phone]   Fax:  [phone] 02/08/2016, 4:40 PM  Name: Cynthia [name] MRN: [id number] Date of Birth: 1936/02/09

## 2016-02-10 ENCOUNTER — Encounter: Payer: Self-pay | Admitting: Physical Therapy

## 2016-02-10 ENCOUNTER — Ambulatory Visit
Admission: RE | Admit: 2016-02-10 | Discharge: 2016-02-10 | Disposition: A | Discharge status: Home / Self Care | Payer: Medicare Other | Source: Ambulatory Visit | Attending: Interventional Radiology | Admitting: Interventional Radiology

## 2016-02-10 ENCOUNTER — Ambulatory Visit: Payer: Medicare Other | Admitting: Physical Therapy

## 2016-02-10 DIAGNOSIS — R2689 Other abnormalities of gait and mobility: Secondary | ICD-10-CM

## 2016-02-10 DIAGNOSIS — G8250 Quadriplegia, unspecified: Secondary | ICD-10-CM

## 2016-02-10 DIAGNOSIS — G610 Guillain-Barre syndrome: Secondary | ICD-10-CM

## 2016-02-10 DIAGNOSIS — C787 Secondary malignant neoplasm of liver and intrahepatic bile duct: Secondary | ICD-10-CM

## 2016-02-10 DIAGNOSIS — R2681 Unsteadiness on feet: Secondary | ICD-10-CM

## 2016-02-10 DIAGNOSIS — M6281 Muscle weakness (generalized): Secondary | ICD-10-CM

## 2016-02-10 DIAGNOSIS — G8220 Paraplegia, unspecified: Secondary | ICD-10-CM

## 2016-02-10 NOTE — Therapy (Signed)
[facility] [address] [place], [place], [address] Phone: [phone]   Fax:  [phone]  Physical Therapy Treatment  Patient Details  Name: Cynthia [name] MRN: [id number] Date of Birth: 06-18-1936 Referring Provider: Naaman Plummer  Encounter Date: 02/10/2016      PT End of Session - 02/10/16 1533    Visit Number 12   Number of Visits 25   Date for PT Re-Evaluation 03/06/16   Authorization Type UHC Medicare-G-code every 10th visit   PT Start Time 1450   PT Stop Time 1530   PT Time Calculation (min) 40 min   Equipment Utilized During Treatment Gait belt   Activity Tolerance Patient tolerated treatment well   Behavior During Therapy [facility] for tasks assessed/performed      Past Medical History  Diagnosis Date  . Anemia   . Blood transfusion without reported diagnosis jan 2015  . Heart murmur   . Hypertension     no bp meds   . Liver cyst   . C. difficile colitis 03/05/14, 03/22/14, 04/05/14    recurrent  . Stroke ([place]) 12/2014    NUMBNESS ON LEFT SIDE  . Cervical cancer ([place]) 1972  . Colon cancer ([place]) JAN 2015  . Colon cancer ([place])   . Cancer ([place])     liver  . Complication of anesthesia     slow to awaken in past, DONE WELL RECENTLY  . Tinnitus of both ears ALL THE TIME  . DVT (deep venous thrombosis) ([place]) 10/24/13    Right leg    Past Surgical History  Procedure Laterality Date  . Back surgery  1981    lower lumbar  . Tubal ligation  1968  . Laparoscopic right colectomy Right 10/22/2013    Procedure: LAPAROSCOPIC RIGHT COLECTOMY ;  Surgeon: [name], MD;  [facility];  Service: General;  Laterality: Right;  . Salpingoophorectomy  10/22/2013    Procedure: Marquette Saa;  Surgeon: [name]. [name], MD;  Location: [facility];  Service: Gynecology;;  . Abdominal hysterectomy      vaginal- partial  . Esophagogastroduodenoscopy N/A 10/14/2015    Procedure: ESOPHAGOGASTRODUODENOSCOPY  (EGD);  Surgeon: Clarene Essex, MD;  Location: [facility] ENDOSCOPY;  Service: Endoscopy;  Laterality: N/A;  Bedside in ICU    There were no vitals filed for this visit.      Subjective Assessment - 02/10/16 1452    Subjective Reports progressing with reciprocal pattern with step negotiation.   Pertinent History GBS February 2017, history of DVT in L calf, CVA with L sided numbness (no reported weakness); history of cancer   Limitations Standing;Walking;House hold activities   Patient Stated Goals Pt's goal is to be able to walk without assistive device   Currently in Pain? Yes   Pain Score 7    Pain Location Foot   Pain Orientation Right;Left   Pain Descriptors / Indicators Tingling;Burning   Pain Onset More than a month ago   Pain Frequency Constant                         [facility] Adult PT Treatment/Exercise - 02/10/16 0001    Ambulation/Gait   Ambulation/Gait Yes   Ambulation/Gait Assistance 4: Min guard   Ambulation/Gait Assistance Details Increased Assist with fatigue   Ambulation Distance (Feet) 230 Feet  + 115 + 200'   Assistive device Straight cane;None   Gait Pattern Step-through pattern;Decreased arm swing - left;Decreased step length - right;Decreased  step length - left;Decreased stance time - left;Decreased trunk rotation   Ambulation Surface Level;Unlevel;Indoor;Outdoor;Paved;Gravel;Grass   Stairs Yes   Stairs Assistance 5: Supervision   Stairs Assistance Details (indicate cue type and reason) Progressed from I rail and cane to no AD   Stair Management Technique One rail Right;One rail Left;Alternating pattern;Forwards   Number of Stairs 4  x6   Ramp 4: Min assist  with SPC   Ramp Details (indicate cue type and reason) cues for increased step length   Curb 4: Min assist   Curb Details (indicate cue type and reason) cues for sequencing             Balance Exercises - 02/10/16 1501    Balance Exercises: Standing   Gait with Head Turns Other reps  (comment)  progressed with changes in direction with SPC, LOB x1 when amb. Backwards requiring Min A.   Turning Both  Amb without an AD reaching for cone with alt. UE support Min A           PT Education - 02/10/16 1532    Education provided Yes   Education Details Aeronautical engineer for community negotiation   Person(s) Educated Patient;Child(ren)   Methods Explanation   Comprehension Verbalized understanding          PT Short Term Goals - 02/05/16 1622    PT SHORT TERM GOAL #1   Title Pt will perform HEP with family supervision for improved strength, ROM, balance and gait.  TARGET 02/05/16   Baseline met 02/05/16   Time 4   Period Weeks   Status Achieved   PT SHORT TERM GOAL #2   Title Pt will improve TUG score to less than or equal to 50 seconds for decreased fall risk.   Baseline 28.65 secs on 02/04/16   Time 4   Period Weeks   Status Achieved   PT SHORT TERM GOAL #3   Title Berg Balance test to be assessed, with Berg score improving by at least 5 points.   Baseline 15/56 on 01/18/16 to 39/56 on 02/04/16   Time 4   Period Weeks   Status Achieved   PT SHORT TERM GOAL #4   Title Pt will improve gait velocity to at least 1 ft/sec for improved gait efficiency and safety.   Baseline 2.00 ft/sec on 02/04/16   Time 4   Period Weeks   Status Achieved   PT SHORT TERM GOAL #5   Title Pt will perform sit<>stand transfers, modified independently, for improved transfer safety and efficiency.   Baseline mod I 02/04/16   Time 4   Period Weeks   Status Achieved           PT Long Term Goals - 02/04/16 1600    PT LONG TERM GOAL #1   Title Pt will verbalize understanding of fall prevention within the home environment.  TARGET 03/06/16   Time 8   Period Weeks   Status New   PT LONG TERM GOAL #2   Title Pt will improve TUG score to less than or equal to 18 seconds for decreased fall risk.   Baseline 28.65 secs on 02/04/16, therefore updated goal   Time 8   Period Weeks   Status  Revised   PT LONG TERM GOAL #3   Title Pt will improve Berg Balance score to 44/56  for decreased fall risk.   Baseline updated 02/04/16 due to progress.    Time 8   Period Weeks  Status Revised   PT LONG TERM GOAL #4   Title Pt will improve gait velocity to 2.62 ft/sec for improved gait efficiency and safety.   Baseline 2.00 ft/sec on 02/04/16, therefore upgraded   Time 8   Period Weeks   Status Revised               Plan - 02/10/16 1533    Clinical Impression Statement Pt continues to need Min guard to Min A with dynamic gait with changes in direction and visual scanning. Making good progress towards goals.   Rehab Potential Good   PT Frequency 3x / week   PT Duration 8 weeks   PT Treatment/Interventions ADLs/Self Care Home Management;Therapeutic exercise;Therapeutic activities;Functional mobility training;Gait training;DME Instruction;Balance training;Neuromuscular re-education;Patient/family education;Orthotic Fit/Training   PT Next Visit Plan continue with more high level balance activities, gait with SPC and without device to increase challlenge (needs to work on heel to toe, arm swing)      Patient will benefit from skilled therapeutic intervention in order to improve the following deficits and impairments:  Abnormal gait, Decreased activity tolerance, Decreased balance, Decreased mobility, Decreased endurance, Decreased range of motion, Decreased safety awareness, Decreased strength, Difficulty walking, Impaired sensation, Pain  Visit Diagnosis: Muscle weakness (generalized)  Other abnormalities of gait and mobility  Unsteadiness on feet  Paraparesis of both lower limbs (HCC)  GBS (Guillain-Barre syndrome) (HCC)  Quadriplegia and quadriparesis (HCC)     Problem List Patient Active Problem List   Diagnosis Date Noted  . Poor appetite   . Neurogenic bladder   . Adjustment disorder with mixed anxiety and depressed mood   . Quadriplegia and quadriparesis  ([place])   . Numbness and tingling   . Paresthesias   . UTI (lower urinary tract infection)   . History of colon cancer   . Acute blood loss anemia   . Upper GI bleed   . GBS (Guillain-Barre syndrome) ([place])   . Aspiration into airway   . Endotracheally intubated   . Acute respiratory failure ([place])   . Shock ([place]) 10/09/2015  . Hemoptysis   . Transverse myelitis ([place])   . Peripheral neuropathy ([place]) 10/06/2015  . Paresthesia of both hands 10/06/2015  . Paresthesia of both feet 10/06/2015  . Cerebral embolism with cerebral infarction 10/06/2015  . Ataxia   . Colon carcinoma metastatic to liver ([place]) 09/25/2015  . Metastasis to liver ([place]) 08/28/2015  . Colon cancer metastasized to liver ([place])   . Liver lesion   . Cerebral infarction due to unspecified mechanism 01/29/2015  . B12 deficiency 01/29/2015  . Essential hypertension 01/29/2015  . Arm numbness left   . Disorientation   . Numbness and tingling of left side of face   . TIA (transient ischemic attack) 01/11/2015  . Confusion   . Left arm numbness   . DVT, lower extremity, distal ([place]) 10/27/2013  . T4a, N0 09/27/2013  . Iron deficiency anemia 09/04/2013  . Symptomatic anemia 09/03/2013  . Orthostasis 09/03/2013  . Near syncope 09/03/2013   Bjorn Loser, PTA  02/10/2016, 3:39 PM [facility] [address] [place], [place], [address] Phone: [phone]   Fax:  [phone]  Name: KATEE WENTLAND MRN: [id number] Date of Birth: 1936-01-03

## 2016-02-11 ENCOUNTER — Encounter: Payer: Self-pay | Admitting: Rehabilitation

## 2016-02-11 ENCOUNTER — Ambulatory Visit: Payer: Medicare Other | Admitting: Rehabilitation

## 2016-02-11 DIAGNOSIS — R2689 Other abnormalities of gait and mobility: Secondary | ICD-10-CM

## 2016-02-11 DIAGNOSIS — M6281 Muscle weakness (generalized): Secondary | ICD-10-CM

## 2016-02-11 DIAGNOSIS — R2681 Unsteadiness on feet: Secondary | ICD-10-CM

## 2016-02-11 NOTE — Therapy (Signed)
[facility] [address] [place], [place], [address] Phone: [phone]   Fax:  [phone]  Physical Therapy Treatment  Patient Details  Name: Cynthia [name] MRN: [id number] Date of Birth: 1935-11-10 Referring Provider: Naaman Plummer  Encounter Date: 02/11/2016      PT End of Session - 02/11/16 1546    Visit Number 13   Number of Visits 25   Date for PT Re-Evaluation 03/06/16   Authorization Type UHC Medicare-G-code every 10th visit   PT Start Time 1533   PT Stop Time 1619   PT Time Calculation (min) 46 min   Equipment Utilized During Treatment Gait belt   Activity Tolerance Patient tolerated treatment well   Behavior During Therapy [facility] for tasks assessed/performed      Past Medical History  Diagnosis Date  . Anemia   . Blood transfusion without reported diagnosis jan 2015  . Heart murmur   . Hypertension     no bp meds   . Liver cyst   . C. difficile colitis 03/05/14, 03/22/14, 04/05/14    recurrent  . Stroke ([place]) 12/2014    NUMBNESS ON LEFT SIDE  . Cervical cancer ([place]) 1972  . Colon cancer ([place]) JAN 2015  . Colon cancer ([place])   . Cancer ([place])     liver  . Complication of anesthesia     slow to awaken in past, DONE WELL RECENTLY  . Tinnitus of both ears ALL THE TIME  . DVT (deep venous thrombosis) ([place]) 10/24/13    Right leg    Past Surgical History  Procedure Laterality Date  . Back surgery  1981    lower lumbar  . Tubal ligation  1968  . Laparoscopic right colectomy Right 10/22/2013    Procedure: LAPAROSCOPIC RIGHT COLECTOMY ;  Surgeon: [name], MD;  Location: [facility];  Service: General;  Laterality: Right;  . Salpingoophorectomy  10/22/2013    Procedure: Marquette Saa;  Surgeon: [name]. [name], MD;  Location: [facility];  Service: Gynecology;;  . Abdominal hysterectomy      vaginal- partial  . Esophagogastroduodenoscopy N/A 10/14/2015    Procedure: ESOPHAGOGASTRODUODENOSCOPY  (EGD);  Surgeon: Clarene Essex, MD;  Location: [facility] ENDOSCOPY;  Service: Endoscopy;  Laterality: N/A;  Bedside in ICU    There were no vitals filed for this visit.      Subjective Assessment - 02/11/16 1541    Subjective Reports walking with cane outdoors over pavement and grass with daughter   Pertinent History GBS February 2017, history of DVT in L calf, CVA with L sided numbness (no reported weakness); history of cancer   Limitations Standing;Walking;House hold activities   Patient Stated Goals Pt's goal is to be able to walk without assistive device   Currently in Pain? Yes   Pain Score 8    Pain Location Foot   Pain Orientation Right;Left   Pain Descriptors / Indicators Burning;Tingling   Pain Type Chronic pain   Pain Onset More than a month ago   Pain Frequency Constant   Aggravating Factors  increased activity    Pain Relieving Factors pain meds            Gait:  Continue to address gait with SPC over varying indoor surfaces.  Initiated session with gait with RW over grassy surfaces as pt voiced wanting to go outside without family being around.  Performed approx 200' with RW at mod I level, therefore provided okay for her to ambulate outdoors without  assist/supervision.  Pt and daughter verbalized understanding.  Continued with SPC outdoors for short distance over grass and mulch x 100' at S to min/guard level.   Cues for stepping sequence.   Progressed to indoor gait with SPC at S to min/guard level as well as ramp and curb negotiation with SPC.  Performed x 3 reps at min A level progressing to min/guard with cues for stepping through on and off of curb step to avoid LOB.    NMR:  Continue to work on gait without device to further challenge balance and also incorporate UE tasks to make more functional.  Had pt simulate loading and unloading dryer as well as carrying clothes basket.  Pt requires min/guard for safety but no overt LOB during task.  Also had pt practice carrying  plate with "food" on it to better simulate home activity.  Pt able to ambulate with min/guard A during this task as well.  Discussed beginning to do some of these things at home with CGA from daughter with use of belt to ensure safety.  Pt and daughter verbalized understanding.                     PT Education - 02/11/16 1545    Education provided Yes   Education Details ambulation on grass with RW at mod I level, begin functional tasks with gait and cane with S/CGA from daughter   Person(s) Educated Patient;Child(ren)   Methods Explanation   Comprehension Verbalized understanding          PT Short Term Goals - 02/05/16 1622    PT SHORT TERM GOAL #1   Title Pt will perform HEP with family supervision for improved strength, ROM, balance and gait.  TARGET 02/05/16   Baseline met 02/05/16   Time 4   Period Weeks   Status Achieved   PT SHORT TERM GOAL #2   Title Pt will improve TUG score to less than or equal to 50 seconds for decreased fall risk.   Baseline 28.65 secs on 02/04/16   Time 4   Period Weeks   Status Achieved   PT SHORT TERM GOAL #3   Title Berg Balance test to be assessed, with Berg score improving by at least 5 points.   Baseline 15/56 on 01/18/16 to 39/56 on 02/04/16   Time 4   Period Weeks   Status Achieved   PT SHORT TERM GOAL #4   Title Pt will improve gait velocity to at least 1 ft/sec for improved gait efficiency and safety.   Baseline 2.00 ft/sec on 02/04/16   Time 4   Period Weeks   Status Achieved   PT SHORT TERM GOAL #5   Title Pt will perform sit<>stand transfers, modified independently, for improved transfer safety and efficiency.   Baseline mod I 02/04/16   Time 4   Period Weeks   Status Achieved           PT Long Term Goals - 02/04/16 1600    PT LONG TERM GOAL #1   Title Pt will verbalize understanding of fall prevention within the home environment.  TARGET 03/06/16   Time 8   Period Weeks   Status New   PT LONG TERM GOAL #2    Title Pt will improve TUG score to less than or equal to 18 seconds for decreased fall risk.   Baseline 28.65 secs on 02/04/16, therefore updated goal   Time 8   Period Weeks  Status Revised   PT LONG TERM GOAL #3   Title Pt will improve Berg Balance score to 44/56  for decreased fall risk.   Baseline updated 02/04/16 due to progress.    Time 8   Period Weeks   Status Revised   PT LONG TERM GOAL #4   Title Pt will improve gait velocity to 2.62 ft/sec for improved gait efficiency and safety.   Baseline 2.00 ft/sec on 02/04/16, therefore upgraded   Time 8   Period Weeks   Status Revised               Plan - 02/12/16 0932    Clinical Impression Statement Skilled session focused on addressing gait outdoors on grass with RW as pt enjoys getting into yard and wants to do so at mod I level, gait with SPC indoors while performing functional tasks with LUE, gait without device while performing functional tasks.  Tolerated well.     Rehab Potential Good   PT Frequency 3x / week   PT Duration 8 weeks   PT Treatment/Interventions ADLs/Self Care Home Management;Therapeutic exercise;Therapeutic activities;Functional mobility training;Gait training;DME Instruction;Balance training;Neuromuscular re-education;Patient/family education;Orthotic Fit/Training   PT Next Visit Plan continue with more high level balance activities, gait with SPC and without device to increase challlenge (needs to work on heel to toe, arm swing)      Patient will benefit from skilled therapeutic intervention in order to improve the following deficits and impairments:  Abnormal gait, Decreased activity tolerance, Decreased balance, Decreased mobility, Decreased endurance, Decreased range of motion, Decreased safety awareness, Decreased strength, Difficulty walking, Impaired sensation, Pain  Visit Diagnosis: Unsteadiness on feet  Muscle weakness (generalized)  Other abnormalities of gait and mobility     Problem  List Patient Active Problem List   Diagnosis Date Noted  . Poor appetite   . Neurogenic bladder   . Adjustment disorder with mixed anxiety and depressed mood   . Quadriplegia and quadriparesis ([place])   . Numbness and tingling   . Paresthesias   . UTI (lower urinary tract infection)   . History of colon cancer   . Acute blood loss anemia   . Upper GI bleed   . GBS (Guillain-Barre syndrome) ([place])   . Aspiration into airway   . Endotracheally intubated   . Acute respiratory failure ([place])   . Shock ([place]) 10/09/2015  . Hemoptysis   . Transverse myelitis ([place])   . Peripheral neuropathy ([place]) 10/06/2015  . Paresthesia of both hands 10/06/2015  . Paresthesia of both feet 10/06/2015  . Cerebral embolism with cerebral infarction 10/06/2015  . Ataxia   . Colon carcinoma metastatic to liver ([place]) 09/25/2015  . Metastasis to liver ([place]) 08/28/2015  . Colon cancer metastasized to liver ([place])   . Liver lesion   . Cerebral infarction due to unspecified mechanism 01/29/2015  . B12 deficiency 01/29/2015  . Essential hypertension 01/29/2015  . Arm numbness left   . Disorientation   . Numbness and tingling of left side of face   . TIA (transient ischemic attack) 01/11/2015  . Confusion   . Left arm numbness   . DVT, lower extremity, distal ([place]) 10/27/2013  . T4a, N0 09/27/2013  . Iron deficiency anemia 09/04/2013  . Symptomatic anemia 09/03/2013  . Orthostasis 09/03/2013  . Near syncope 09/03/2013    Cameron Sprang, PT, MPT [facility] [address] [place], [place], [address] Phone: [phone]   Fax:  [phone] 02/12/2016, 9:35 AM  Name:  SHAKENNA HERRERO MRN: [id number] Date of Birth: Jun 28, 1936

## 2016-02-19 ENCOUNTER — Ambulatory Visit: Payer: Medicare Other | Attending: Physical Medicine & Rehabilitation | Admitting: Rehabilitation

## 2016-02-19 ENCOUNTER — Encounter: Payer: Self-pay | Admitting: Rehabilitation

## 2016-02-19 DIAGNOSIS — G8250 Quadriplegia, unspecified: Secondary | ICD-10-CM | POA: Diagnosis present

## 2016-02-19 DIAGNOSIS — R2681 Unsteadiness on feet: Secondary | ICD-10-CM | POA: Diagnosis present

## 2016-02-19 DIAGNOSIS — M6281 Muscle weakness (generalized): Secondary | ICD-10-CM

## 2016-02-19 DIAGNOSIS — R2689 Other abnormalities of gait and mobility: Secondary | ICD-10-CM | POA: Insufficient documentation

## 2016-02-19 NOTE — Therapy (Signed)
[place] [address] [place], [place], [address] Phone: [phone]   Fax:  [phone]  Physical Therapy Treatment  Patient Details  Name: Cynthia [name] MRN: [id number] Date of Birth: 11-28-1935 Referring Provider: Naaman Plummer  Encounter Date: 02/19/2016      PT End of Session - 02/19/16 2057    Visit Number 14   Number of Visits 25   Date for PT Re-Evaluation 03/06/16   Authorization Type UHC Medicare-G-code every 10th visit   PT Start Time 1447   PT Stop Time 1530   PT Time Calculation (min) 43 min   Equipment Utilized During Treatment Gait belt   Activity Tolerance Patient tolerated treatment well   Behavior During Therapy [facility] for tasks assessed/performed      Past Medical History  Diagnosis Date  . Anemia   . Blood transfusion without reported diagnosis jan 2015  . Heart murmur   . Hypertension     no bp meds   . Liver cyst   . C. difficile colitis 03/05/14, 03/22/14, 04/05/14    recurrent  . Stroke ([place]) 12/2014    NUMBNESS ON LEFT SIDE  . Cervical cancer ([place]) 1972  . Colon cancer ([place]) JAN 2015  . Colon cancer ([place])   . Cancer ([place])     liver  . Complication of anesthesia     slow to awaken in past, DONE WELL RECENTLY  . Tinnitus of both ears ALL THE TIME  . DVT (deep venous thrombosis) ([place]) 10/24/13    Right leg    Past Surgical History  Procedure Laterality Date  . Back surgery  1981    lower lumbar  . Tubal ligation  1968  . Laparoscopic right colectomy Right 10/22/2013    Procedure: LAPAROSCOPIC RIGHT COLECTOMY ;  Surgeon: [name], MD;  Location[facility];  Service: General;  Laterality: Right;  . Salpingoophorectomy  10/22/2013    Procedure: Marquette Saa;  Surgeon: [name]. [name], MD;  Location: [facility];  Service: Gynecology;;  . Abdominal hysterectomy      vaginal- partial  . Esophagogastroduodenoscopy N/A 10/14/2015    Procedure: ESOPHAGOGASTRODUODENOSCOPY  (EGD);  Surgeon: Clarene Essex, MD;  Location: [facility] ENDOSCOPY;  Service: Endoscopy;  Laterality: N/A;  Bedside in ICU    There were no vitals filed for this visit.      Subjective Assessment - 02/19/16 1455    Subjective Reports walking more with cane at home, no more w/c at home, but does report swelling in BLEs despite elevating them and wearing compression stockings.  Recommend she notifiy MD regarding swelling.    Patient is accompained by: Family member   Pertinent History GBS February 2017, history of DVT in L calf, CVA with L sided numbness (no reported weakness); history of cancer   Limitations Standing;Walking;House hold activities   Patient Stated Goals Pt's goal is to be able to walk without assistive device   Currently in Pain? Yes   Pain Score 7    Pain Location Foot   Pain Orientation Right;Left   Pain Descriptors / Indicators Tingling;Burning  more burning today   Pain Type Chronic pain   Pain Onset More than a month ago   Pain Frequency Constant   Aggravating Factors  increased activity   Pain Relieving Factors pain meds                         [facility] Adult PT Treatment/Exercise - 02/19/16  1504    Ambulation/Gait   Ambulation/Gait Yes   Ambulation/Gait Assistance 6: Modified independent (Device/Increase time);5: Supervision;4: Min guard   Ambulation/Gait Assistance Details Continued gait training indoors and outdoors with use of SPC.  Made pt mod I over indoor surfaces with SPC.  Ambulated over 500' over outdoor surfaces (grassy, paved, gravel, uphill/downhill).  Pt S for all except for on instance of LOB in which she requires min/guard to correct.  Recommend S for outdoor use of SPC, but encouraged her to begin this at home.  Also encouraged pt to use SPC to attend church this Sunday with [facility] as long as daughter could provide CGA if needed.  Pt and daughter verbalized understanding.  She still requires cues for improved L arm swing and improved breath control  throughout.     Ambulation Distance (Feet) 300 Feet  indoors and another 500' outdoors   Assistive device Straight cane   Gait Pattern Step-through pattern;Decreased arm swing - left;Decreased step length - right;Decreased step length - left;Decreased stance time - left;Decreased trunk rotation   Ambulation Surface Level;Unlevel;Indoor;Outdoor;Paved;Gravel;Grass   Door Management 5: Supervision   Door Managment Details (indicate cue type and reason) Min cues for better handling technique when opening door to her for increased safety.    Ramp 5: Supervision   Curb 5: Supervision   Curb Details (indicate cue type and reason) Min cues for step through sequencing   Gait Comments Also practiced stepping over threshold to simulate getting out onto deck with SPC.  Performed x 3 reps at mod I level.                  PT Education - 02/19/16 2056    Education provided Yes   Education Details ambulation at mod I level indoors, S outdoors, attend church w/ [facility] with assist from daughter   Person(s) Educated Patient;Child(ren)   Methods Explanation   Comprehension Verbalized understanding          PT Short Term Goals - 02/05/16 1622    PT SHORT TERM GOAL #1   Title Pt will perform HEP with family supervision for improved strength, ROM, balance and gait.  TARGET 02/05/16   Baseline met 02/05/16   Time 4   Period Weeks   Status Achieved   PT SHORT TERM GOAL #2   Title Pt will improve TUG score to less than or equal to 50 seconds for decreased fall risk.   Baseline 28.65 secs on 02/04/16   Time 4   Period Weeks   Status Achieved   PT SHORT TERM GOAL #3   Title Berg Balance test to be assessed, with Berg score improving by at least 5 points.   Baseline 15/56 on 01/18/16 to 39/56 on 02/04/16   Time 4   Period Weeks   Status Achieved   PT SHORT TERM GOAL #4   Title Pt will improve gait velocity to at least 1 ft/sec for improved gait efficiency and safety.   Baseline 2.00 ft/sec on  02/04/16   Time 4   Period Weeks   Status Achieved   PT SHORT TERM GOAL #5   Title Pt will perform sit<>stand transfers, modified independently, for improved transfer safety and efficiency.   Baseline mod I 02/04/16   Time 4   Period Weeks   Status Achieved           PT Long Term Goals - 02/04/16 1600    PT LONG TERM GOAL #1   Title Pt  will verbalize understanding of fall prevention within the home environment.  TARGET 03/06/16   Time 8   Period Weeks   Status New   PT LONG TERM GOAL #2   Title Pt will improve TUG score to less than or equal to 18 seconds for decreased fall risk.   Baseline 28.65 secs on 02/04/16, therefore updated goal   Time 8   Period Weeks   Status Revised   PT LONG TERM GOAL #3   Title Pt will improve Berg Balance score to 44/56  for decreased fall risk.   Baseline updated 02/04/16 due to progress.    Time 8   Period Weeks   Status Revised   PT LONG TERM GOAL #4   Title Pt will improve gait velocity to 2.62 ft/sec for improved gait efficiency and safety.   Baseline 2.00 ft/sec on 02/04/16, therefore upgraded   Time 8   Period Weeks   Status Revised               Plan - 02/19/16 2057    Clinical Impression Statement Skilled session focused on gait training with SPC over indoor surfaces.  Pt now mod I with task and state to do at home.  Also worked on gait over many varying outdoor surfaces.  Pt overall S level with single LOB requiring min/guard to correct.  Educated to do at home with close S of daughter.  Also encouraged pt to attend church with [facility] as long as daughter could assist her.     Rehab Potential Good   PT Frequency 3x / week   PT Duration 8 weeks   PT Treatment/Interventions ADLs/Self Care Home Management;Therapeutic exercise;Therapeutic activities;Functional mobility training;Gait training;DME Instruction;Balance training;Neuromuscular re-education;Patient/family education;Orthotic Fit/Training   PT Next Visit Plan continue with  more high level balance activities, gait with SPC and without device (esp indoors with bimanual tasks to increase functionality) to increase challlenge (needs to work on heel to toe, arm swing)   Consulted and Agree with Plan of Care Patient;Family member/caregiver   Family Member Consulted daughter      Patient will benefit from skilled therapeutic intervention in order to improve the following deficits and impairments:  Abnormal gait, Decreased activity tolerance, Decreased balance, Decreased mobility, Decreased endurance, Decreased range of motion, Decreased safety awareness, Decreased strength, Difficulty walking, Impaired sensation, Pain  Visit Diagnosis: Unsteadiness on feet  Other abnormalities of gait and mobility  Muscle weakness (generalized)     Problem List Patient Active Problem List   Diagnosis Date Noted  . Poor appetite   . Neurogenic bladder   . Adjustment disorder with mixed anxiety and depressed mood   . Quadriplegia and quadriparesis ([place])   . Numbness and tingling   . Paresthesias   . UTI (lower urinary tract infection)   . History of colon cancer   . Acute blood loss anemia   . Upper GI bleed   . GBS (Guillain-Barre syndrome) ([place])   . Aspiration into airway   . Endotracheally intubated   . Acute respiratory failure ([place])   . Shock ([place]) 10/09/2015  . Hemoptysis   . Transverse myelitis ([place])   . Peripheral neuropathy ([place]) 10/06/2015  . Paresthesia of both hands 10/06/2015  . Paresthesia of both feet 10/06/2015  . Cerebral embolism with cerebral infarction 10/06/2015  . Ataxia   . Colon carcinoma metastatic to liver ([place]) 09/25/2015  . Metastasis to liver ([place]) 08/28/2015  . Colon cancer metastasized to liver ([place])   . Liver  lesion   . Cerebral infarction due to unspecified mechanism 01/29/2015  . B12 deficiency 01/29/2015  . Essential hypertension 01/29/2015  . Arm numbness left   . Disorientation   . Numbness and tingling of left side of  face   . TIA (transient ischemic attack) 01/11/2015  . Confusion   . Left arm numbness   . DVT, lower extremity, distal ([place]) 10/27/2013  . T4a, N0 09/27/2013  . Iron deficiency anemia 09/04/2013  . Symptomatic anemia 09/03/2013  . Orthostasis 09/03/2013  . Near syncope 09/03/2013    Cameron Sprang, PT, MPT [facility] [address] [place], [place], [address] Phone: [phone]   Fax:  [phone] 02/19/2016, 9:06 PM  Name: Cynthia [name] MRN: [id number] Date of Birth: [birth year]

## 2016-02-22 NOTE — Progress Notes (Signed)
Chief Complaint: Status post thermal ablation of liver metastases.  History of Present Illness: Cynthia [name] is a 80 y.o. female status post CT guided microwave thermal ablation of 2 separate right lobe liver metastases from metastatic colon carcinoma on 09/25/2015.  The procedure was technically uncomplicated and well tolerated.  She developed significant Guillain-Barre syndrome 11 days after the procedure, requiring an extended hospitalization and has made an excellent recovery with some residual weakness.  She is still undergoing some outpatient rehabilitation treatment.  CEA remains normal and follow up MRI imaging was performed on 02/02/16.  Past Medical History  Diagnosis Date  . Anemia   . Blood transfusion without reported diagnosis jan 2015  . Heart murmur   . Hypertension     no bp meds   . Liver cyst   . C. difficile colitis 03/05/14, 03/22/14, 04/05/14    recurrent  . Stroke ([place]) 12/2014    NUMBNESS ON LEFT SIDE  . Cervical cancer ([place]) 1972  . Colon cancer ([place]) JAN 2015  . Colon cancer ([place])   . Cancer ([place])     liver  . Complication of anesthesia     slow to awaken in past, DONE WELL RECENTLY  . Tinnitus of both ears ALL THE TIME  . DVT (deep venous thrombosis) ([place]) 10/24/13    Right leg    Past Surgical History  Procedure Laterality Date  . Back surgery  1981    lower lumbar  . Tubal ligation  1968  . Laparoscopic right colectomy Right 10/22/2013    Procedure: LAPAROSCOPIC RIGHT COLECTOMY ;  Surgeon: [name], MD;  Location: [facility];  Service: General;  Laterality: Right;  . Salpingoophorectomy  10/22/2013    Procedure: Marquette Saa;  Surgeon: [name]. [name], MD;  Location: [facility];  Service: Gynecology;;  . Abdominal hysterectomy      vaginal- partial  . Esophagogastroduodenoscopy N/A 10/14/2015    Procedure: ESOPHAGOGASTRODUODENOSCOPY (EGD);  Surgeon: Clarene Essex, MD;  Location: [facility] ENDOSCOPY;  Service: Endoscopy;  Laterality:  N/A;  Bedside in ICU    Allergies: Review of patient's allergies indicates no known allergies.  Medications: Prior to Admission medications   Medication Sig Start Date End Date Taking? Authorizing Provider  acetaminophen (TYLENOL) 500 MG tablet Take 500 mg by mouth every 6 (six) hours as needed for mild pain.   Yes Historical Provider, MD  atorvastatin (LIPITOR) 40 MG tablet Take 1 tablet (40 mg total) by mouth daily. 01/13/15  Yes Ashly M Gottschalk, DO  metoprolol succinate (TOPROL-XL) 25 MG 24 hr tablet Take 37.5 mg by mouth every evening.  12/20/14  Yes Historical Provider, MD  Multiple Vitamin (MULTIVITAMIN WITH MINERALS) TABS tablet Take 1 tablet by mouth every morning.    Yes Historical Provider, MD  pantoprazole (PROTONIX) 40 MG tablet Take 1 tablet (40 mg total) by mouth daily. 02/03/16  Yes Meredith Staggers, MD  gabapentin (NEURONTIN) 400 MG capsule Take 1 capsule (400 mg total) by mouth 3 (three) times daily. 01/19/16   Meredith Staggers, MD     Family History  Problem Relation Age of Onset  . Diabetes Sister   . Cancer Sister     leukemia  . Diabetes Brother   . Heart failure Brother   . Hypertension Brother   . Breast cancer Maternal Aunt   . Rheum arthritis Mother   . Heart failure Father   . Cancer Maternal Grandmother     GIST  . Heart failure  Maternal Grandfather   . Aneurysm Paternal Grandmother     Social History   Social History  . Marital Status: Widowed    Spouse Name: N/A  . Number of Children: N/A  . Years of Education: N/A   Social History Main Topics  . Smoking status: Never Smoker   . Smokeless tobacco: Never Used  . Alcohol Use: No  . Drug Use: No  . Sexual Activity: No   Other Topics Concern  . Not on file   Social History Narrative    ECOG Status: 1 - Symptomatic but completely ambulatory  Review of Systems: A 12 point ROS discussed and pertinent positives are indicated in the HPI above.  All other systems are negative.  Review of  Systems  Constitutional: Positive for fatigue. Negative for fever, chills, diaphoresis, activity change and appetite change.  Respiratory: Negative.   Cardiovascular: Negative.   Gastrointestinal: Negative.   Genitourinary: Negative.   Musculoskeletal: Negative.   Neurological: Positive for weakness. Negative for dizziness, seizures, syncope, speech difficulty, light-headedness and numbness.     Vital Signs: BP 110/75 mmHg  Pulse 79  Temp(Src) 97.7 F (36.5 C) (Oral)  Resp 14  Ht 5' 1"  (1.549 m)  Wt 133 lb (60.328 kg)  BMI 25.14 kg/m2  SpO2 98%  Physical Exam  Constitutional: She is oriented to person, place, and time. She appears well-developed and well-nourished. No distress.  Pulmonary/Chest: Effort normal.  Abdominal: Soft. She exhibits no distension. There is no tenderness. There is no rebound and no guarding.  Neurological: She is alert and oriented to person, place, and time.  Skin: She is not diaphoretic.  Nursing note and vitals reviewed.   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 EXAM: MRI ABDOMEN WITHOUT AND WITH CONTRAST TECHNIQUE: Multiplanar multisequence MR imaging of the abdomen was performed both before and after the administration of intravenous contrast. CONTRAST:  53m MULTIHANCE GADOBENATE DIMEGLUMINE 529 MG/ML IV SOLN COMPARISON:  08/20/2015 MRI abdomen. 07/28/2015 PET-CT. 07/16/2015 CT abdomen/pelvis. FINDINGS: Motion degraded scan. Lower chest: Clear lung bases. Hepatobiliary: Normal liver size and configuration. Minimal diffuse hepatic steatosis. Multiple simple liver cysts in the lateral segment left liver lobe measuring up to the 5.6 x 4.0 cm, previously 5.5 x 4.0 cm, not appreciably changed. There is a 2.4 x 1.6 cm ablation zone with  heterogeneous precontrast T1 hyperintensity consistent with coagulative necrosis at the right liver dome (series 902/ image 23) without appreciable internal enhancement to suggest residual or recurrent tumor. There is a 2.8 x 2.1 cm ablation zone in the posterior inferior right liver lobe with heterogeneous precontrast T1 hyperintensity consistent with coagulative necrosis (series 905/image 48), without appreciable internal enhancement on the subtraction sequences to suggest residual or recurrent tumor. No new liver masses. Normal gallbladder with no cholelithiasis. No biliary ductal dilatation. Common bile duct diameter 3 mm. No choledocholithiasis. Pancreas: No pancreatic mass or duct dilation.  No pancreas divisum. Spleen: Normal size. No mass. Adrenals/Urinary Tract: Normal adrenals. No hydronephrosis. Normal kidneys with no renal mass. Stomach/Bowel: Grossly normal stomach. Visualized small and large bowel is normal caliber, with no bowel wall thickening. Vascular/Lymphatic: Atherosclerotic nonaneurysmal abdominal aorta. Patent portal, splenic, hepatic and renal veins. No pathologically enlarged lymph nodes in the abdomen. Other: No abdominal ascites or focal fluid collection. Musculoskeletal: No aggressive appearing focal osseous lesions. IMPRESSION:  1. Coagulative necrosis at the two ablated liver tumor sites, with no enhancement to suggest residual or recurrent liver tumor. 2. No new liver masses or other findings of metastatic disease in the abdomen. Electronically Signed   By: Ilona Sorrel M.D.   On: 02/02/2016 12:09    Labs:  CBC:  Recent Labs  11/30/15 0441 12/01/15 12/01/15 0556 12/07/15 01/29/16 0914  WBC 4.6 4.3 4.3 4.8  4.8 5.3  HGB 10.1*  --  10.6* 11.0* 11.7  HCT 32.6*  --  34.2* 35* 37.2  PLT 233  --  241 259 243    COAGS:  Recent Labs  07/30/15 1230 09/21/15 1035 10/06/15 0612 10/12/15 1200 11/27/15 1622 11/28/15 0627 01/29/16 0914  INR 1.07 1.03 1.19  --  1.22 1.22  1.1  APTT 22* 28 28 27   --   --   --     BMP:  Recent Labs  11/13/15 0520 11/19/15 0511 11/25/15 11/25/15 0742 12/07/15 01/29/16 0914  NA 138 139 140 140 144 145  K 4.1 4.4  --  4.3 4.5 4.4  CL 104 107  --  108  --  106  CO2 27 24  --  23  --  27  GLUCOSE 96 90  --  90  --  81  BUN 10 12 9 9 10 13   CALCIUM 8.8* 9.0  --  9.0  --  9.2  CREATININE 0.51 0.62 0.5 0.52 0.6 0.66  GFRNONAA >60 >60  --  >60  --  85  GFRAA >60 >60  --  >60  --  >89    LIVER FUNCTION TESTS:  Recent Labs  10/20/15 0500 10/25/15 0801 10/28/15 0652 12/07/15 01/29/16 0914  BILITOT 0.4 1.0 0.9  --  0.4  AST 44* 32 23 12* 20  ALT 29 33 20 7 11   ALKPHOS 40 46 42 49 37  PROT 5.9* 6.6 6.1*  --  6.0*  ALBUMIN 1.7* 2.2* 2.0*  --  3.5*    TUMOR MARKERS:  Recent Labs  04/16/15 1027 07/16/15 0755 01/07/16 1420  CEA 2.6 1.7 0.9    Assessment and Plan:  I met with Cynthia [name] and her daughter.  We reviewed the recent MRI of the abdomen that shows coagulative necrosis at the level of the two separately treated right lobe liver metastases without evidence of residual enhancing tumor or new metastatic lesions.  She is now in clinical remission.  I recommended another follow up MRI in 6 months.  She will also follow up with Dr. Benay Spice in a few months.  Electronically SignedAletta Edouard T 02/22/2016, 11:34 AM   I spent a total of 15 Minutes in face to face in clinical consultation, greater than 50% of which was counseling/coordinating care post ablation of liver metastases.

## 2016-02-24 ENCOUNTER — Ambulatory Visit: Payer: Medicare Other | Admitting: Physical Therapy

## 2016-02-24 DIAGNOSIS — M6281 Muscle weakness (generalized): Secondary | ICD-10-CM

## 2016-02-24 DIAGNOSIS — R2681 Unsteadiness on feet: Secondary | ICD-10-CM

## 2016-02-24 DIAGNOSIS — R2689 Other abnormalities of gait and mobility: Secondary | ICD-10-CM

## 2016-02-24 NOTE — Therapy (Signed)
[facility] [address] [place], [place], [address] Phone: [phone]   Fax:  [phone]  Physical Therapy Treatment  Patient Details  Name: Cynthia [name] MRN: [id number] Date of Birth: 1935-08-28 Referring Provider: Naaman Plummer  Encounter Date: 02/24/2016      PT End of Session - 02/24/16 1540    Visit Number 15   Number of Visits 25   Date for PT Re-Evaluation 03/06/16   Authorization Type [employer] Medicare-G-code every 10th visit   PT Start Time 1450   PT Stop Time 1530   PT Time Calculation (min) 40 min   Equipment Utilized During Treatment Gait belt   Activity Tolerance Patient tolerated treatment well   Behavior During Therapy [facility] for tasks assessed/performed      Past Medical History  Diagnosis Date  . Anemia   . Blood transfusion without reported diagnosis jan 2015  . Heart murmur   . Hypertension     no bp meds   . Liver cyst   . C. difficile colitis 03/05/14, 03/22/14, 04/05/14    recurrent  . Stroke ([place]) 12/2014    NUMBNESS ON LEFT SIDE  . Cervical cancer ([place]) 1972  . Colon cancer ([place]) JAN 2015  . Colon cancer ([place])   . Cancer ([place])     liver  . Complication of anesthesia     slow to awaken in past, DONE WELL RECENTLY  . Tinnitus of both ears ALL THE TIME  . DVT (deep venous thrombosis) ([place]) 10/24/13    Right leg    Past Surgical History  Procedure Laterality Date  . Back surgery  1981    lower lumbar  . Tubal ligation  1968  . Laparoscopic right colectomy Right 10/22/2013    Procedure: LAPAROSCOPIC RIGHT COLECTOMY ;  Surgeon: [name], MD;  Location: [facility];  Service: General;  Laterality: Right;  . Salpingoophorectomy  10/22/2013    Procedure: Marquette Saa;  Surgeon: [name]. [name], MD;  Location: [facility];  Service: Gynecology;;  . Abdominal hysterectomy      vaginal- partial  . Esophagogastroduodenoscopy N/A 10/14/2015    Procedure: ESOPHAGOGASTRODUODENOSCOPY  (EGD);  Surgeon: Clarene Essex, MD;  Location: [facility] ENDOSCOPY;  Service: Endoscopy;  Laterality: N/A;  Bedside in ICU    There were no vitals filed for this visit.      Subjective Assessment - 02/24/16 1457    Subjective Reports walking more with cane at home,went to church using walker, but does report swelling in BLEs despite elevating them and wearing compression stockings.  Recommend she notifiy MD regarding swelling.    Patient is accompained by: Family member   Pertinent History GBS February 2017, history of DVT in L calf, CVA with L sided numbness (no reported weakness); history of cancer   Limitations Standing;Walking;House hold activities   Patient Stated Goals Pt's goal is to be able to walk without assistive device   Currently in Pain? Yes   Pain Score 7    Pain Location Foot   Pain Orientation Right;Left   Pain Descriptors / Indicators Burning;Tingling   Pain Type Chronic pain   Pain Onset More than a month ago   Pain Frequency Constant                         [facility] Adult PT Treatment/Exercise - 02/24/16 0001    Ambulation/Gait   Ambulation/Gait Yes   Ambulation/Gait Assistance 6: Modified independent (Device/Increase time)  Ambulation/Gait Assistance Details Working on activity tolerance and to trial if using walker reduces LE pain.  Working on balance with SPC on various surfaces.   Ambulation Distance (Feet) 600 Feet  + 270f   Assistive device Rolling walker   Gait Pattern Step-through pattern;Decreased arm swing - left;Decreased step length - right;Decreased step length - left;Decreased stance time - left;Decreased trunk rotation   Ambulation Surface Level;Unlevel   Ramp 5: Supervision   Ramp Details (indicate cue type and reason) with SPC cues to increased step length   Curb 5: Supervision   Curb Details (indicate cue type and reason) cues for cane sequence   Ankle Exercises: Stretches   Gastroc Stretch 2 reps;30 seconds  ball of foot on 2" block;  each LE             Balance Exercises - 02/24/16 1538    Balance Exercises: Standing   Standing Eyes Opened Wide (BOA);Head turns, hip strategies with lateral and anterior/posterior wt shifts;Foam/compliant surface;30 secs  intermittent UE support           PT Education - 02/24/16 1539    Education provided Yes   Education Details [facility]  Person(s) Educated Patient;Child(ren)   Methods Explanation   Comprehension Verbalized understanding          PT Short Term Goals - 02/05/16 1622    PT SHORT TERM GOAL #1   Title Pt will perform HEP with family supervision for improved strength, ROM, balance and gait.  TARGET 02/05/16   Baseline met 02/05/16   Time 4   Period Weeks   Status Achieved   PT SHORT TERM GOAL #2   Title Pt will improve TUG score to less than or equal to 50 seconds for decreased fall risk.   Baseline 28.65 secs on 02/04/16   Time 4   Period Weeks   Status Achieved   PT SHORT TERM GOAL #3   Title Berg Balance test to be assessed, with Berg score improving by at least 5 points.   Baseline 15/56 on 01/18/16 to 39/56 on 02/04/16   Time 4   Period Weeks   Status Achieved   PT SHORT TERM GOAL #4   Title Pt will improve gait velocity to at least 1 ft/sec for improved gait efficiency and safety.   Baseline 2.00 ft/sec on 02/04/16   Time 4   Period Weeks   Status Achieved   PT SHORT TERM GOAL #5   Title Pt will perform sit<>stand transfers, modified independently, for improved transfer safety and efficiency.   Baseline mod I 02/04/16   Time 4   Period Weeks   Status Achieved           PT Long Term Goals - 02/04/16 1600    PT LONG TERM GOAL #1   Title Pt will verbalize understanding of fall prevention within the home environment.  TARGET 03/06/16   Time 8   Period Weeks   Status New   PT LONG TERM GOAL #2   Title Pt will improve TUG score to less than or equal to 18 seconds for decreased fall risk.   Baseline  28.65 secs on 02/04/16, therefore updated goal   Time 8   Period Weeks   Status Revised   PT LONG TERM GOAL #3   Title Pt will improve Berg Balance score to 44/56  for decreased fall risk.   Baseline updated 02/04/16 due to progress.    Time  8   Period Weeks   Status Revised   PT LONG TERM GOAL #4   Title Pt will improve gait velocity to 2.62 ft/sec for improved gait efficiency and safety.   Baseline 2.00 ft/sec on 02/04/16, therefore upgraded   Time 8   Period Weeks   Status Revised               Plan - 02/24/16 1541    Clinical Impression Statement Pt continues to have significant pain and swelling in  both LE esp with longer gait distances.  Encouraged pt to follow up with her PCP about swelling concern. Trialled using the walker for longer gait distance to see if greater UE support would ease LE pain;pt reported continued pain.   Rehab Potential Good   PT Frequency 3x / week   PT Duration 8 weeks   PT Treatment/Interventions ADLs/Self Care Home Management;Therapeutic exercise;Therapeutic activities;Functional mobility training;Gait training;DME Instruction;Balance training;Neuromuscular re-education;Patient/family education;Orthotic Fit/Training   PT Next Visit Plan continue with more high level balance activities, gait with SPC and without device (esp indoors with bimanual tasks to increase functionality) to increase challlenge (needs to work on heel to toe, arm swing)   Consulted and Agree with Plan of Care Patient;Family member/caregiver   Family Member Consulted daughter      Patient will benefit from skilled therapeutic intervention in order to improve the following deficits and impairments:  Abnormal gait, Decreased activity tolerance, Decreased balance, Decreased mobility, Decreased endurance, Decreased range of motion, Decreased safety awareness, Decreased strength, Difficulty walking, Impaired sensation, Pain  Visit Diagnosis: Unsteadiness on feet  Muscle weakness  (generalized)  Other abnormalities of gait and mobility     Problem List Patient Active Problem List   Diagnosis Date Noted  . Poor appetite   . Neurogenic bladder   . Adjustment disorder with mixed anxiety and depressed mood   . Quadriplegia and quadriparesis ([place])   . Numbness and tingling   . Paresthesias   . UTI (lower urinary tract infection)   . History of colon cancer   . Acute blood loss anemia   . Upper GI bleed   . GBS (Guillain-Barre syndrome) ([place])   . Aspiration into airway   . Endotracheally intubated   . Acute respiratory failure ([place])   . Shock ([place]) 10/09/2015  . Hemoptysis   . Transverse myelitis ([place])   . Peripheral neuropathy ([place]) 10/06/2015  . Paresthesia of both hands 10/06/2015  . Paresthesia of both feet 10/06/2015  . Cerebral embolism with cerebral infarction 10/06/2015  . Ataxia   . Colon carcinoma metastatic to liver ([place]) 09/25/2015  . Metastasis to liver ([place]) 08/28/2015  . Colon cancer metastasized to liver ([place])   . Liver lesion   . Cerebral infarction due to unspecified mechanism 01/29/2015  . B12 deficiency 01/29/2015  . Essential hypertension 01/29/2015  . Arm numbness left   . Disorientation   . Numbness and tingling of left side of face   . TIA (transient ischemic attack) 01/11/2015  . Confusion   . Left arm numbness   . DVT, lower extremity, distal ([place]) 10/27/2013  . T4a, N0 09/27/2013  . Iron deficiency anemia 09/04/2013  . Symptomatic anemia 09/03/2013  . Orthostasis 09/03/2013  . Near syncope 09/03/2013    Bjorn Loser, PTA  02/24/2016, 3:57 PM [facility] [address] [place], [place], [address] Phone: [phone]   Fax:  [phone]  Name: Cynthia [name] MRN: [id number] Date of Birth: [birth year]

## 2016-02-26 ENCOUNTER — Ambulatory Visit: Payer: Medicare Other | Admitting: Rehabilitation

## 2016-02-26 ENCOUNTER — Encounter: Payer: Self-pay | Admitting: Rehabilitation

## 2016-02-26 DIAGNOSIS — R2681 Unsteadiness on feet: Secondary | ICD-10-CM

## 2016-02-26 DIAGNOSIS — M6281 Muscle weakness (generalized): Secondary | ICD-10-CM

## 2016-02-26 DIAGNOSIS — R2689 Other abnormalities of gait and mobility: Secondary | ICD-10-CM

## 2016-02-26 NOTE — Therapy (Signed)
[name] [address] [name], [place], [address] Phone: [phone]   Fax:  [phone]  Physical Therapy Treatment  Patient Details  Name: Cynthia [name] MRN: [id number] Date of Birth: [birth year] Referring Provider: Naaman Plummer  Encounter Date: 02/26/2016      PT End of Session - 02/26/16 1455    Visit Number 16   Number of Visits 25   Date for PT Re-Evaluation 03/06/16   Authorization Type UHC Medicare-G-code every 10th visit   PT Start Time 1446   PT Stop Time 1531   PT Time Calculation (min) 45 min   Equipment Utilized During Treatment Gait belt   Activity Tolerance Patient tolerated treatment well   Behavior During Therapy [facility] for tasks assessed/performed      Past Medical History  Diagnosis Date  . Anemia   . Blood transfusion without reported diagnosis jan 2015  . Heart murmur   . Hypertension     no bp meds   . Liver cyst   . C. difficile colitis 03/05/14, 03/22/14, 04/05/14    recurrent  . Stroke ([place]) 12/2014    NUMBNESS ON LEFT SIDE  . Cervical cancer ([place]) 1972  . Colon cancer ([place]) JAN 2015  . Colon cancer ([place])   . Cancer ([place])     liver  . Complication of anesthesia     slow to awaken in past, DONE WELL RECENTLY  . Tinnitus of both ears ALL THE TIME  . DVT (deep venous thrombosis) ([place]) 10/24/13    Right leg    Past Surgical History  Procedure Laterality Date  . Back surgery  1981    lower lumbar  . Tubal ligation  1968  . Laparoscopic right colectomy Right 10/22/2013    Procedure: LAPAROSCOPIC RIGHT COLECTOMY ;  Surgeon: [name], MD;  Location: [facility];  Service: General;  Laterality: Right;  . Salpingoophorectomy  10/22/2013    Procedure: Marquette Saa;  Surgeon: [name]. [name], MD;  Location: [facility];  Service: Gynecology;;  . Abdominal hysterectomy      vaginal- partial  . Esophagogastroduodenoscopy N/A 10/14/2015    Procedure: ESOPHAGOGASTRODUODENOSCOPY  (EGD);  Surgeon: Clarene Essex, MD;  Location: [facility];  Service: Endoscopy;  Laterality: N/A;  Bedside in ICU    There were no vitals filed for this visit.      Subjective Assessment - 02/26/16 1453    Subjective Reports walking with cane all the time at home.  Still with some swelling in B ankles, but better.  She has not called MD yet, but plans to Monday.     Patient is accompained by: Family member   Pertinent History GBS February 2017, history of DVT in L calf, CVA with L sided numbness (no reported weakness); history of cancer   Limitations Standing;Walking;House hold activities   Patient Stated Goals Pt's goal is to be able to walk without assistive device   Currently in Pain? Yes   Pain Score 6    Pain Location Foot   Pain Orientation Right;Left   Pain Descriptors / Indicators Burning;Tingling   Pain Type Chronic pain   Pain Onset More than a month ago   Pain Frequency Constant           TE:  Provided pt with seated hamstring stretch due to increased pain in back of knee, see pt instruction for details.  Tried to reproduce pain during session as she states squatting down increases pain in back of knee.  Had pt "walk" while seated on rolling stool.  Pt reports that this somewhat increases pain, therefore feel that pain is likely muscular in nature and therefore provided hamstring stretch to assess if this will help.    Gait:  Performed gait >250' without AD during session at S to mod I level over varying indoor surfaces.  Provided okay to do at home on her own.    NMR:  Added gait with balance in order to simulate more functional tasks.  Had pt ambulate while negotiating over and around obstacles, reaching to floor to retrieve objects, placing in cabinets while weight shifting side to side.  Pt with no LOB during tasks, therefore feel she can begin this at home.   Performed wall bumps x 5 reps on ground>eyes closed on ground x 10 reps with intermittent min A for forward  LOB>on foam balance beam to increase hip strategy x 10 reps with cues for posture and technique.  Also performed balance while standing on ramp (while on foam mat) with feet together x 30 seconds, feet in modified tandem x 30 secs each EO and then EC.  Pt continues to have increased difficulty with EC.                        PT Education - 02/26/16 1455    Education provided Yes   Education Details Continue to educate on notifying MD regarding swelling, d/c use of cane in home.     Person(s) Educated Patient;Child(ren)   Methods Explanation   Comprehension Verbalized understanding          PT Short Term Goals - 02/05/16 1622    PT SHORT TERM GOAL #1   Title Pt will perform HEP with family supervision for improved strength, ROM, balance and gait.  TARGET 02/05/16   Baseline met 02/05/16   Time 4   Period Weeks   Status Achieved   PT SHORT TERM GOAL #2   Title Pt will improve TUG score to less than or equal to 50 seconds for decreased fall risk.   Baseline 28.65 secs on 02/04/16   Time 4   Period Weeks   Status Achieved   PT SHORT TERM GOAL #3   Title Berg Balance test to be assessed, with Berg score improving by at least 5 points.   Baseline 15/56 on 01/18/16 to 39/56 on 02/04/16   Time 4   Period Weeks   Status Achieved   PT SHORT TERM GOAL #4   Title Pt will improve gait velocity to at least 1 ft/sec for improved gait efficiency and safety.   Baseline 2.00 ft/sec on 02/04/16   Time 4   Period Weeks   Status Achieved   PT SHORT TERM GOAL #5   Title Pt will perform sit<>stand transfers, modified independently, for improved transfer safety and efficiency.   Baseline mod I 02/04/16   Time 4   Period Weeks   Status Achieved           PT Long Term Goals - 02/04/16 1600    PT LONG TERM GOAL #1   Title Pt will verbalize understanding of fall prevention within the home environment.  TARGET 03/06/16   Time 8   Period Weeks   Status New   PT LONG TERM GOAL #2    Title Pt will improve TUG score to less than or equal to 18 seconds for decreased fall risk.   Baseline 28.65 secs on 02/04/16,  therefore updated goal   Time 8   Period Weeks   Status Revised   PT LONG TERM GOAL #3   Title Pt will improve Berg Balance score to 44/56  for decreased fall risk.   Baseline updated 02/04/16 due to progress.    Time 8   Period Weeks   Status Revised   PT LONG TERM GOAL #4   Title Pt will improve gait velocity to 2.62 ft/sec for improved gait efficiency and safety.   Baseline 2.00 ft/sec on 02/04/16, therefore upgraded   Time 8   Period Weeks   Status Revised               Plan - 02/26/16 1455    Clinical Impression Statement Pt with improved swelling in B ankles, however still present.  Continue to encourage pt to speak with MD regarding matter.  Skilled session addressed high level gait/balance without use of device to better simulate functional tasks at home.  Pt tolerated welll, therefore gave okay for pt to ambulate at home without cane.     Rehab Potential Good   PT Frequency 2x / week   PT Duration 8 weeks   PT Treatment/Interventions ADLs/Self Care Home Management;Therapeutic exercise;Therapeutic activities;Functional mobility training;Gait training;DME Instruction;Balance training;Neuromuscular re-education;Patient/family education;Orthotic Fit/Training   PT Next Visit Plan continue with more high level balance activities, gait with SPC and without device (esp indoors with bimanual tasks to increase functionality) to increase challlenge (needs to work on heel to toe, arm swing)   Consulted and Agree with Plan of Care Patient;Family member/caregiver   Family Member Consulted daughter      Patient will benefit from skilled therapeutic intervention in order to improve the following deficits and impairments:  Abnormal gait, Decreased activity tolerance, Decreased balance, Decreased mobility, Decreased endurance, Decreased range of motion, Decreased  safety awareness, Decreased strength, Difficulty walking, Impaired sensation, Pain  Visit Diagnosis: Unsteadiness on feet  Muscle weakness (generalized)  Other abnormalities of gait and mobility     Problem List Patient Active Problem List   Diagnosis Date Noted  . Poor appetite   . Neurogenic bladder   . Adjustment disorder with mixed anxiety and depressed mood   . Quadriplegia and quadriparesis ([place])   . Numbness and tingling   . Paresthesias   . UTI (lower urinary tract infection)   . History of colon cancer   . Acute blood loss anemia   . Upper GI bleed   . GBS (Guillain-Barre syndrome) ([place])   . Aspiration into airway   . Endotracheally intubated   . Acute respiratory failure ([place])   . Shock ([place]) 10/09/2015  . Hemoptysis   . Transverse myelitis ([place])   . Peripheral neuropathy ([place]) 10/06/2015  . Paresthesia of both hands 10/06/2015  . Paresthesia of both feet 10/06/2015  . Cerebral embolism with cerebral infarction 10/06/2015  . Ataxia   . Colon carcinoma metastatic to liver ([place]) 09/25/2015  . Metastasis to liver ([place]) 08/28/2015  . Colon cancer metastasized to liver ([place])   . Liver lesion   . Cerebral infarction due to unspecified mechanism 01/29/2015  . B12 deficiency 01/29/2015  . Essential hypertension 01/29/2015  . Arm numbness left   . Disorientation   . Numbness and tingling of left side of face   . TIA (transient ischemic attack) 01/11/2015  . Confusion   . Left arm numbness   . DVT, lower extremity, distal ([place]) 10/27/2013  . T4a, N0 09/27/2013  . Iron deficiency anemia 09/04/2013  .  Symptomatic anemia 09/03/2013  . Orthostasis 09/03/2013  . Near syncope 09/03/2013    Cameron Sprang, PT, MPT [facility] [address] [place], [place], [address] Phone: [phone]   Fax:  [phone] 02/26/2016, 5:01 PM  Name: AYEN VIVIANO MRN: [id number] Date of Birth: 1936-05-09

## 2016-02-26 NOTE — Patient Instructions (Signed)
Hamstring Stretch    Inhale and straighten spine. Exhale and lean forward toward extended leg. Hold position for _60__ seconds. Inhale and come back to center. Repeat with other leg extended. Repeat _2__ times, alternating legs. Do _2-3__ times per day.  Copyright  VHI. All rights reserved.

## 2016-02-29 ENCOUNTER — Ambulatory Visit: Payer: Medicare Other | Admitting: Physical Therapy

## 2016-02-29 DIAGNOSIS — R2681 Unsteadiness on feet: Secondary | ICD-10-CM

## 2016-02-29 DIAGNOSIS — M6281 Muscle weakness (generalized): Secondary | ICD-10-CM

## 2016-02-29 DIAGNOSIS — R2689 Other abnormalities of gait and mobility: Secondary | ICD-10-CM

## 2016-02-29 NOTE — Therapy (Signed)
[facility] [address] [place], [place], [address] Phone: [phone]   Fax:  [phone]  Physical Therapy Treatment  Patient Details  Name: Cynthia [name] MRN: [id number] Date of Birth: 1936/07/01 Referring Provider: Naaman Plummer  Encounter Date: 02/29/2016      PT End of Session - 02/29/16 1543    Visit Number 17   Number of Visits 25   Date for PT Re-Evaluation 03/06/16   Authorization Type UHC Medicare-G-code every 10th visit   PT Start Time 1448   PT Stop Time 1533   PT Time Calculation (min) 45 min   Equipment Utilized During Treatment Gait belt   Activity Tolerance Patient tolerated treatment well   Behavior During Therapy [facility] for tasks assessed/performed      Past Medical History  Diagnosis Date  . Anemia   . Blood transfusion without reported diagnosis jan 2015  . Heart murmur   . Hypertension     no bp meds   . Liver cyst   . C. difficile colitis 03/05/14, 03/22/14, 04/05/14    recurrent  . Stroke ([place]) 12/2014    NUMBNESS ON LEFT SIDE  . Cervical cancer ([place]) 1972  . Colon cancer ([place]) JAN 2015  . Colon cancer ([place])   . Cancer ([place])     liver  . Complication of anesthesia     slow to awaken in past, DONE WELL RECENTLY  . Tinnitus of both ears ALL THE TIME  . DVT (deep venous thrombosis) ([place]) 10/24/13    Right leg    Past Surgical History  Procedure Laterality Date  . Back surgery  1981    lower lumbar  . Tubal ligation  1968  . Laparoscopic right colectomy Right 10/22/2013    Procedure: LAPAROSCOPIC RIGHT COLECTOMY ;  Surgeon: [name], MD;  [facility];  Service: General;  Laterality: Right;  . Salpingoophorectomy  10/22/2013    Procedure: Marquette Saa;  Surgeon: [name]. [name], MD;  Location: [facility];  Service: Gynecology;;  . Abdominal hysterectomy      vaginal- partial  . Esophagogastroduodenoscopy N/A 10/14/2015    Procedure: ESOPHAGOGASTRODUODENOSCOPY  (EGD);  Surgeon: Clarene Essex, MD;  Location: [facility] ENDOSCOPY;  Service: Endoscopy;  Laterality: N/A;  Bedside in ICU    There were no vitals filed for this visit.      Subjective Assessment - 02/29/16 1452    Subjective Went to church this Sunday with SPC, no falls. Sees MD this coming Friday.   Patient is accompained by: Family member   Pertinent History GBS February 2017, history of DVT in L calf, CVA with L sided numbness (no reported weakness); history of cancer   Limitations Standing;Walking;House hold activities   Patient Stated Goals Pt's goal is to be able to walk without assistive device   Currently in Pain? Yes   Pain Score 6    Pain Location Foot   Pain Orientation Right;Left   Pain Descriptors / Indicators Tingling;Burning   Pain Onset More than a month ago                         [facility] Adult PT Treatment/Exercise - 02/29/16 0001    Ambulation/Gait   Ambulation/Gait Yes   Ambulation/Gait Assistance 4: Min guard   Ambulation/Gait Assistance Details working in balance amb on compliant surface with head turns   Ambulation Distance (Feet) 600 Feet   Assistive device Straight cane   Gait Pattern  Step-through pattern;Decreased arm swing - left;Decreased step length - right;Decreased step length - left;Decreased stance time - left;Decreased trunk rotation   Ambulation Surface Paved;Gravel;Grass;Outdoor   Careers adviser on the treadmill- working on increasing steplength, and heel to gait pattern; 3 min x2 with gastroc stretching between. Speed up to 1.81mh   Knee/Hip Exercises: Stretches   Active Hamstring Stretch Both;60 seconds;2 reps  reviewed technique.   Ankle Exercises: Stretches   Gastroc Stretch 2 reps;30 seconds             Balance Exercises - 02/29/16 1540    Balance Exercises: Standing   Step Taps Forward;6 inch  step tap progressing with head turns, min A           PT Education - 02/29/16 1542    Education provided Yes    Education Details How to incorporate visual scanning into more functional gait.   Person(s) Educated Patient;Child(ren)   Methods Explanation;Demonstration;Verbal cues   Comprehension Verbalized understanding;Returned demonstration;Verbal cues required;Need further instruction          PT Short Term Goals - 02/05/16 1622    PT SHORT TERM GOAL #1   Title Pt will perform HEP with family supervision for improved strength, ROM, balance and gait.  TARGET 02/05/16   Baseline met 02/05/16   Time 4   Period Weeks   Status Achieved   PT SHORT TERM GOAL #2   Title Pt will improve TUG score to less than or equal to 50 seconds for decreased fall risk.   Baseline 28.65 secs on 02/04/16   Time 4   Period Weeks   Status Achieved   PT SHORT TERM GOAL #3   Title Berg Balance test to be assessed, with Berg score improving by at least 5 points.   Baseline 15/56 on 01/18/16 to 39/56 on 02/04/16   Time 4   Period Weeks   Status Achieved   PT SHORT TERM GOAL #4   Title Pt will improve gait velocity to at least 1 ft/sec for improved gait efficiency and safety.   Baseline 2.00 ft/sec on 02/04/16   Time 4   Period Weeks   Status Achieved   PT SHORT TERM GOAL #5   Title Pt will perform sit<>stand transfers, modified independently, for improved transfer safety and efficiency.   Baseline mod I 02/04/16   Time 4   Period Weeks   Status Achieved           PT Long Term Goals - 02/04/16 1600    PT LONG TERM GOAL #1   Title Pt will verbalize understanding of fall prevention within the home environment.  TARGET 03/06/16   Time 8   Period Weeks   Status New   PT LONG TERM GOAL #2   Title Pt will improve TUG score to less than or equal to 18 seconds for decreased fall risk.   Baseline 28.65 secs on 02/04/16, therefore updated goal   Time 8   Period Weeks   Status Revised   PT LONG TERM GOAL #3   Title Pt will improve Berg Balance score to 44/56  for decreased fall risk.   Baseline updated 02/04/16 due  to progress.    Time 8   Period Weeks   Status Revised   PT LONG TERM GOAL #4   Title Pt will improve gait velocity to 2.62 ft/sec for improved gait efficiency and safety.   Baseline 2.00 ft/sec on 02/04/16, therefore upgraded   Time 8  Period Weeks   Status Revised               Plan - 02/29/16 1543    Clinical Impression Statement Gait trainer assess pts gait: below normal speed, step length, and step cycle and normal time spent on each foot.  Pt continues to demonstrate imbalance with gait on outdoor uneven and compliant surfaces with a cane esp dual tasking with visual scanning requiring intermittent min guard.   Rehab Potential Good   PT Frequency 2x / week   PT Duration 8 weeks   PT Treatment/Interventions ADLs/Self Care Home Management;Therapeutic exercise;Therapeutic activities;Functional mobility training;Gait training;DME Instruction;Balance training;Neuromuscular re-education;Patient/family education;Orthotic Fit/Training   PT Next Visit Plan continue with more high level balance activities (compliant surfaces), gait with SPC and without device (esp indoors with bimanual tasks to increase functionality) to increase challlenge (needs to work on heel to toe, arm swing)   Consulted and Agree with Plan of Care Patient;Family member/caregiver   Family Member Consulted daughter      Patient will benefit from skilled therapeutic intervention in order to improve the following deficits and impairments:  Abnormal gait, Decreased activity tolerance, Decreased balance, Decreased mobility, Decreased endurance, Decreased range of motion, Decreased safety awareness, Decreased strength, Difficulty walking, Impaired sensation, Pain  Visit Diagnosis: Unsteadiness on feet  Muscle weakness (generalized)  Other abnormalities of gait and mobility     Problem List Patient Active Problem List   Diagnosis Date Noted  . Poor appetite   . Neurogenic bladder   . Adjustment disorder  with mixed anxiety and depressed mood   . Quadriplegia and quadriparesis ([place])   . Numbness and tingling   . Paresthesias   . UTI (lower urinary tract infection)   . History of colon cancer   . Acute blood loss anemia   . Upper GI bleed   . GBS (Guillain-Barre syndrome) ([place])   . Aspiration into airway   . Endotracheally intubated   . Acute respiratory failure ([place])   . Shock ([place]) 10/09/2015  . Hemoptysis   . Transverse myelitis ([place])   . Peripheral neuropathy ([place]) 10/06/2015  . Paresthesia of both hands 10/06/2015  . Paresthesia of both feet 10/06/2015  . Cerebral embolism with cerebral infarction 10/06/2015  . Ataxia   . Colon carcinoma metastatic to liver ([place]) 09/25/2015  . Metastasis to liver ([place]) 08/28/2015  . Colon cancer metastasized to liver ([place])   . Liver lesion   . Cerebral infarction due to unspecified mechanism 01/29/2015  . B12 deficiency 01/29/2015  . Essential hypertension 01/29/2015  . Arm numbness left   . Disorientation   . Numbness and tingling of left side of face   . TIA (transient ischemic attack) 01/11/2015  . Confusion   . Left arm numbness   . DVT, lower extremity, distal ([place]) 10/27/2013  . T4a, N0 09/27/2013  . Iron deficiency anemia 09/04/2013  . Symptomatic anemia 09/03/2013  . Orthostasis 09/03/2013  . Near syncope 09/03/2013    Bjorn Loser 02/29/2016, 3:49 PM  [facility] [address] [place], [place], [address] Phone: [phone]   Fax:  [phone]  Name: Cynthia [name] MRN: [id number] Date of Birth: 1935/11/15

## 2016-02-29 NOTE — Patient Instructions (Signed)
Hamstring Stretch    Inhale and straighten spine. Exhale and lean forward toward extended leg. Hold position for _60__ seconds. Inhale and come back to center. Repeat with other leg extended. Repeat _2__ times, alternating legs. Do _2-3__ times per day.  Copyright  VHI. All rights reserved.

## 2016-03-03 ENCOUNTER — Encounter: Payer: Self-pay | Admitting: Rehabilitation

## 2016-03-03 ENCOUNTER — Ambulatory Visit: Payer: Medicare Other | Admitting: Rehabilitation

## 2016-03-03 DIAGNOSIS — R2681 Unsteadiness on feet: Secondary | ICD-10-CM

## 2016-03-03 DIAGNOSIS — G8250 Quadriplegia, unspecified: Secondary | ICD-10-CM

## 2016-03-03 DIAGNOSIS — R2689 Other abnormalities of gait and mobility: Secondary | ICD-10-CM

## 2016-03-03 DIAGNOSIS — M6281 Muscle weakness (generalized): Secondary | ICD-10-CM

## 2016-03-04 DIAGNOSIS — R2681 Unsteadiness on feet: Secondary | ICD-10-CM | POA: Diagnosis not present

## 2016-03-04 NOTE — Therapy (Signed)
[facility] [address] [place], [place], [address] Phone: [phone]   Fax:  [phone]  Physical Therapy Treatment and D/C Summary  Patient Details  Name: Cynthia [name] MRN: [id number] Date of Birth: [birth year] Referring Provider: Naaman Plummer  Encounter Date: 03/03/2016      PT End of Session - 03/04/16 0802    Visit Number 18   Number of Visits 25  didn't meet # of visits due to decreased frequency   Date for PT Re-Evaluation 03/06/16   Authorization Type [employer] Medicare-G-code every 10th visit   PT Start Time 1532   PT Stop Time 1620   PT Time Calculation (min) 48 min   Equipment Utilized During Treatment Gait belt   Activity Tolerance Patient tolerated treatment well   Behavior During Therapy [facility] for tasks assessed/performed      Past Medical History  Diagnosis Date  . Anemia   . Blood transfusion without reported diagnosis jan 2015  . Heart murmur   . Hypertension     no bp meds   . Liver cyst   . C. difficile colitis 03/05/14, 03/22/14, 04/05/14    recurrent  . Stroke ([place]) 12/2014    NUMBNESS ON LEFT SIDE  . Cervical cancer ([place]) 1972  . Colon cancer ([place]) JAN 2015  . Colon cancer ([place])   . Cancer ([place])     liver  . Complication of anesthesia     slow to awaken in past, DONE WELL RECENTLY  . Tinnitus of both ears ALL THE TIME  . DVT (deep venous thrombosis) ([place]) 10/24/13    Right leg    Past Surgical History  Procedure Laterality Date  . Back surgery  1981    lower lumbar  . Tubal ligation  1968  . Laparoscopic right colectomy Right 10/22/2013    Procedure: LAPAROSCOPIC RIGHT COLECTOMY ;  Surgeon: [name], MD;  Location: [facility];  Service: General;  Laterality: Right;  . Salpingoophorectomy  10/22/2013    Procedure: Mar[place] Saa;  Surgeon: [name]. [name], MD;  Location: [facility];  Service: Gynecology;;  . Abdominal hysterectomy      vaginal- partial  .  Esophagogastroduodenoscopy N/A 10/14/2015    Procedure: ESOPHAGOGASTRODUODENOSCOPY (EGD);  Surgeon: Clarene Essex, MD;  Location: [facility] ENDOSCOPY;  Service: Endoscopy;  Laterality: N/A;  Bedside in ICU    There were no vitals filed for this visit.      Subjective Assessment - 03/03/16 1540    Subjective I'm walking some without the cane to let people in the church, but I have walls near by.    Patient is accompained by: Family member   Pertinent History GBS February 2017, history of DVT in L calf, CVA with L sided numbness (no reported weakness); history of cancer   Limitations Standing;Walking;House hold activities   Patient Stated Goals Pt's goal is to be able to walk without assistive device   Currently in Pain? Yes   Pain Score 5    Pain Location Foot   Pain Orientation Right;Left   Pain Descriptors / Indicators Burning;Tingling   Pain Type Chronic pain   Pain Onset More than a month ago   Pain Frequency Constant                         [facility] Adult PT Treatment/Exercise - 03/03/16 1556    Berg Balance Test   Sit to Stand Able to stand without using hands and  stabilize independently   Standing Unsupported Able to stand safely 2 minutes   Sitting with Back Unsupported but Feet Supported on Floor or Stool Able to sit safely and securely 2 minutes   Stand to Sit Sits safely with minimal use of hands   Transfers Able to transfer safely, minor use of hands   Standing Unsupported with Eyes Closed Able to stand 10 seconds with supervision   Standing Ubsupported with Feet Together Able to place feet together independently and stand 1 minute safely   From Standing, Reach Forward with Outstretched Arm Can reach forward >12 cm safely (5")   From Standing Position, Pick up Object from Floor Able to pick up shoe safely and easily   From Standing Position, Turn to Look Behind Over each Shoulder Looks behind from both sides and weight shifts well   Turn 360 Degrees Able to turn 360  degrees safely but slowly   Standing Unsupported, Alternately Place Feet on Step/Stool Able to stand independently and safely and complete 8 steps in 20 seconds   Standing Unsupported, One Foot in Front Able to plae foot ahead of the other independently and hold 30 seconds   Standing on One Leg Tries to lift leg/unable to hold 3 seconds but remains standing independently   Total Score 48      NMR:  Performed BERG balance test to assess LTG, see details above.    Self Care:  Had lengthy discussion with pt regarding D/C from therapy vs re-certification to work towards gait without SPC, esp over outdoor surfaces.  Pt and daughter seem to be "burned out" with therapy and would like to take a break from therapy at this time.  Education on how to progress at home/community as well as how to request order for return to therapy if needed.  Pt and daughter verbalized understanding.    TA:  Assessed TUG with and without SPC.  Note time to be 21.14 seconds with cane, and 20.08 without use of cane.  Also assessed 23mwalk for gait speed.  Gait speed with SPC is 1.80 ft/sec.  Note that this is slower than when assessed STGs, however pt was still utilizing RW at time of STGs and [facility]  She is more cautious with SPC and therefore gait speed slower than with RW.            PT Education - 03/03/16 1542    Education provided Yes   Education Details Education on break from PT, coming back if needed, and how to progress on her own at home.    Person(s) Educated Patient;Child(ren)   Methods Explanation   Comprehension Verbalized understanding          PT Short Term Goals - 02/05/16 1622    PT SHORT TERM GOAL #1   Title Pt will perform HEP with family supervision for improved strength, ROM, balance and gait.  TARGET 02/05/16   Baseline met 02/05/16   Time 4   Period Weeks   Status Achieved   PT SHORT TERM GOAL #2   Title Pt will improve TUG score to less than or equal to 50  seconds for decreased fall risk.   Baseline 28.65 secs on 02/04/16   Time 4   Period Weeks   Status Achieved   PT SHORT TERM GOAL #3   Title Berg Balance test to be assessed, with Berg score improving by at least 5 points.   Baseline 15/56 on 01/18/16 to 39/56  on 02/04/16   Time 4   Period Weeks   Status Achieved   PT SHORT TERM GOAL #4   Title Pt will improve gait velocity to at least 1 ft/sec for improved gait efficiency and safety.   Baseline 2.00 ft/sec on 02/04/16   Time 4   Period Weeks   Status Achieved   PT SHORT TERM GOAL #5   Title Pt will perform sit<>stand transfers, modified independently, for improved transfer safety and efficiency.   Baseline mod I 02/04/16   Time 4   Period Weeks   Status Achieved           PT Long Term Goals - 03/03/16 1612    PT LONG TERM GOAL #1   Title Pt will verbalize understanding of fall prevention within the home environment.  TARGET 03/06/16   Baseline met 03/03/16   Time 8   Period Weeks   Status Achieved   PT LONG TERM GOAL #2   Title Pt will improve TUG score to less than or equal to 18 seconds for decreased fall risk.   Baseline 21.14 with the cane, 20.08 without cane   Time 8   Period Weeks   Status Not Met  feel she did not meet due to using cane vs RW   PT LONG TERM GOAL #3   Title Pt will improve Berg Balance score to 44/56  for decreased fall risk.   Baseline 48//56 on 03/03/16   Time 8   Period Weeks   Status Achieved   PT LONG TERM GOAL #4   Title Pt will improve gait velocity to 2.62 ft/sec for improved gait efficiency and safety.   Baseline 1.80 ft/sec on 03/03/16 with cane   Time 8   Period Weeks   Status Not Met  again, feel she did not meet due to using SPC vs RW when last tested               Plan - 11-Mar-2016 0805    Clinical Impression Statement Skilled session focused on addressing LTGs and discussion of whether to re-cert or allow pt to take break from therapy to increase independence at home on her  own.  Pt decided to take break from therapy, therefore provided education on progressing independence at home.  Pt and daughter agree with D/C.  Pt has met 2/4 LTGs, she did not meet gait speed goals due to using SPC now vs RW when previously tested and pt much more cautious with gait vs RW.     Rehab Potential Good   PT Frequency 2x / week   PT Duration 8 weeks   PT Treatment/Interventions ADLs/Self Care Home Management;Therapeutic exercise;Therapeutic activities;Functional mobility training;Gait training;DME Instruction;Balance training;Neuromuscular re-education;Patient/family education;Orthotic Fit/Training   PT Next Visit Plan n/a   Consulted and Agree with Plan of Care Patient;Family member/caregiver   Family Member Consulted daughter      Patient will benefit from skilled therapeutic intervention in order to improve the following deficits and impairments:  Abnormal gait, Decreased activity tolerance, Decreased balance, Decreased mobility, Decreased endurance, Decreased range of motion, Decreased safety awareness, Decreased strength, Difficulty walking, Impaired sensation, Pain  Visit Diagnosis: Unsteadiness on feet  Muscle weakness (generalized)  Other abnormalities of gait and mobility  Quadriplegia and quadriparesis (HCC)       G-Codes - 2016-03-11 0808    Functional Assessment Tool Used BERG 48/56, TUG 20.08 w/o cane, 21.14 w/ cane   Functional Limitation Mobility: Walking and moving around  Mobility: Walking and Moving Around Current Status [phone]) At least 1 percent but less than 20 percent impaired, limited or restricted   Mobility: Walking and Moving Around Goal Status [phone]) At least 1 percent but less than 20 percent impaired, limited or restricted   Mobility: Walking and Moving Around Discharge Status [phone]) At least 1 percent but less than 20 percent impaired, limited or restricted      PHYSICAL THERAPY DISCHARGE SUMMARY  Visits from Start of Care: 18  Current  functional level related to goals / functional outcomes: See LTGs above   Remaining deficits: Continues to have high level balance deficits, but is now ambulatory without cane inside the home, with cane in community (with S from daughter, otherwise mod I w/ RW)   Education / Equipment: HEP, [facility]  Plan: Patient agrees to discharge.  Patient goals were partially met. Patient is being discharged due to meeting the stated rehab goals.  ?????and wanting break from therapy at this time.              Problem List Patient Active Problem List   Diagnosis Date Noted  . Poor appetite   . Neurogenic bladder   . Adjustment disorder with mixed anxiety and depressed mood   . Quadriplegia and quadriparesis ([place])   . Numbness and tingling   . Paresthesias   . UTI (lower urinary tract infection)   . History of colon cancer   . Acute blood loss anemia   . Upper GI bleed   . GBS (Guillain-Barre syndrome) ([place])   . Aspiration into airway   . Endotracheally intubated   . Acute respiratory failure ([place])   . Shock ([place]) 10/09/2015  . Hemoptysis   . Transverse myelitis ([place])   . Peripheral neuropathy ([place]) 10/06/2015  . Paresthesia of both hands 10/06/2015  . Paresthesia of both feet 10/06/2015  . Cerebral embolism with cerebral infarction 10/06/2015  . Ataxia   . Colon carcinoma metastatic to liver ([place]) 09/25/2015  . Metastasis to liver ([place]) 08/28/2015  . Colon cancer metastasized to liver ([place])   . Liver lesion   . Cerebral infarction due to unspecified mechanism 01/29/2015  . B12 deficiency 01/29/2015  . Essential hypertension 01/29/2015  . Arm numbness left   . Disorientation   . Numbness and tingling of left side of face   . TIA (transient ischemic attack) 01/11/2015  . Confusion   . Left arm numbness   . DVT, lower extremity, distal ([place]) 10/27/2013  . T4a, N0 09/27/2013  . Iron deficiency anemia 09/04/2013  . Symptomatic anemia 09/03/2013  . Orthostasis 09/03/2013  . Near  syncope 09/03/2013    Cameron Sprang, PT, MPT [facility] [address] [place], [place], [address] Phone: [phone]   Fax:  [phone] 03/04/2016, 8:10 AM  Name: Cynthia [name] MRN: [id number] Date of Birth: 06/10/1936

## 2016-03-07 ENCOUNTER — Ambulatory Visit: Payer: Self-pay | Admitting: Physical Therapy

## 2016-03-10 ENCOUNTER — Ambulatory Visit: Payer: Self-pay | Admitting: Rehabilitation

## 2016-04-27 ENCOUNTER — Telehealth: Payer: Self-pay | Admitting: Physical Medicine & Rehabilitation

## 2016-04-27 MED ORDER — GABAPENTIN 400 MG PO CAPS: 400.0000 mg | ORAL_CAPSULE | Freq: Three times a day (TID) | ORAL | 2 refills | Status: DC

## 2016-04-27 NOTE — Telephone Encounter (Signed)
Gabapentin sent to pharmacy. Pt aware.

## 2016-04-27 NOTE — Telephone Encounter (Signed)
Patient needs a refill on Gabapentin to be sent to [facility] on [address].  Please call patient when this is done.

## 2016-05-04 ENCOUNTER — Encounter: Payer: Medicare Other | Attending: Physical Medicine & Rehabilitation | Admitting: Physical Medicine & Rehabilitation

## 2016-05-04 ENCOUNTER — Encounter: Payer: Self-pay | Admitting: Physical Medicine & Rehabilitation

## 2016-05-04 VITALS — BP 147/78 | HR 76 | Resp 14

## 2016-05-04 DIAGNOSIS — Z808 Family history of malignant neoplasm of other organs or systems: Secondary | ICD-10-CM | POA: Diagnosis not present

## 2016-05-04 DIAGNOSIS — Z8261 Family history of arthritis: Secondary | ICD-10-CM | POA: Insufficient documentation

## 2016-05-04 DIAGNOSIS — Z8619 Personal history of other infectious and parasitic diseases: Secondary | ICD-10-CM | POA: Insufficient documentation

## 2016-05-04 DIAGNOSIS — I10 Essential (primary) hypertension: Secondary | ICD-10-CM | POA: Insufficient documentation

## 2016-05-04 DIAGNOSIS — R202 Paresthesia of skin: Secondary | ICD-10-CM

## 2016-05-04 DIAGNOSIS — D649 Anemia, unspecified: Secondary | ICD-10-CM | POA: Diagnosis not present

## 2016-05-04 DIAGNOSIS — Z833 Family history of diabetes mellitus: Secondary | ICD-10-CM | POA: Insufficient documentation

## 2016-05-04 DIAGNOSIS — G610 Guillain-Barre syndrome: Secondary | ICD-10-CM | POA: Diagnosis not present

## 2016-05-04 DIAGNOSIS — R600 Localized edema: Secondary | ICD-10-CM | POA: Diagnosis not present

## 2016-05-04 DIAGNOSIS — Z85038 Personal history of other malignant neoplasm of large intestine: Secondary | ICD-10-CM | POA: Diagnosis not present

## 2016-05-04 DIAGNOSIS — R269 Unspecified abnormalities of gait and mobility: Secondary | ICD-10-CM | POA: Diagnosis not present

## 2016-05-04 DIAGNOSIS — R011 Cardiac murmur, unspecified: Secondary | ICD-10-CM | POA: Diagnosis not present

## 2016-05-04 DIAGNOSIS — G8250 Quadriplegia, unspecified: Secondary | ICD-10-CM | POA: Insufficient documentation

## 2016-05-04 DIAGNOSIS — C787 Secondary malignant neoplasm of liver and intrahepatic bile duct: Secondary | ICD-10-CM | POA: Insufficient documentation

## 2016-05-04 DIAGNOSIS — Z90710 Acquired absence of both cervix and uterus: Secondary | ICD-10-CM | POA: Diagnosis not present

## 2016-05-04 DIAGNOSIS — Z90722 Acquired absence of ovaries, bilateral: Secondary | ICD-10-CM | POA: Diagnosis not present

## 2016-05-04 DIAGNOSIS — Z806 Family history of leukemia: Secondary | ICD-10-CM | POA: Diagnosis not present

## 2016-05-04 DIAGNOSIS — Z86718 Personal history of other venous thrombosis and embolism: Secondary | ICD-10-CM | POA: Insufficient documentation

## 2016-05-04 DIAGNOSIS — Z803 Family history of malignant neoplasm of breast: Secondary | ICD-10-CM | POA: Diagnosis not present

## 2016-05-04 DIAGNOSIS — Z8541 Personal history of malignant neoplasm of cervix uteri: Secondary | ICD-10-CM | POA: Diagnosis not present

## 2016-05-04 DIAGNOSIS — Z8249 Family history of ischemic heart disease and other diseases of the circulatory system: Secondary | ICD-10-CM | POA: Insufficient documentation

## 2016-05-04 DIAGNOSIS — Z8673 Personal history of transient ischemic attack (TIA), and cerebral infarction without residual deficits: Secondary | ICD-10-CM | POA: Insufficient documentation

## 2016-05-04 DIAGNOSIS — C189 Malignant neoplasm of colon, unspecified: Secondary | ICD-10-CM | POA: Diagnosis not present

## 2016-05-04 DIAGNOSIS — R200 Anesthesia of skin: Secondary | ICD-10-CM | POA: Insufficient documentation

## 2016-05-04 MED ORDER — GABAPENTIN 400 MG PO CAPS: 400.0000 mg | ORAL_CAPSULE | ORAL | 2 refills | Status: DC

## 2016-05-04 NOTE — Patient Instructions (Signed)
Omega 3 Fatty Acids, B12, Folate or B complex -----for nerve health.   Continue compression and elevatoin of your legs as you are.     PLEASE CALL ME WITH ANY PROBLEMS OR QUESTIONS [phone])

## 2016-05-04 NOTE — Progress Notes (Signed)
Subjective:    Patient ID: Cynthia [name], female    DOB: 1935/12/25, 80 y.o.   MRN: [id number]  HPI   Mrs. Leete is here in follow up of her GBS and associated gait deficits. She is still using her cane but her activity levels have improved. She often goes without the cane at home. Her dysesthesias are problematic at night in particular and when she's been on her feet for longer periods of time. She has noticed some swelling as well. She wears her TED's daily    Pain Inventory Average Pain 7 Pain Right Now 7 My pain is burning and tingling  In the last 24 hours, has pain interfered with the following? General activity 3 Relation with others 0 Enjoyment of life 1 What TIME of day is your pain at its worst? evening Sleep (in general) Good  Pain is worse with: walking, standing and some activites Pain improves with: rest and medication Relief from Meds: 9  Mobility use a cane how many minutes can you walk? 30 ability to climb steps?  yes do you drive?  no  Function retired  Neuro/Psych tingling trouble walking  Prior Studies Any changes since last visit?  no  Physicians involved in your care Any changes since last visit?  no   Family History  Problem Relation Age of Onset  . Diabetes Sister   . Cancer Sister     leukemia  . Diabetes Brother   . Heart failure Brother   . Hypertension Brother   . Breast cancer Maternal Aunt   . Rheum arthritis Mother   . Heart failure Father   . Cancer Maternal Grandmother     GIST  . Heart failure Maternal Grandfather   . Aneurysm Paternal Grandmother    Social History   Social History  . Marital status: Widowed    Spouse name: N/A  . Number of children: N/A  . Years of education: N/A   Social History Main Topics  . Smoking status: Never Smoker  . Smokeless tobacco: Never Used  . Alcohol use No  . Drug use: No  . Sexual activity: No   Other Topics Concern  . None   Social History Narrative  .  None   Past Surgical History:  Procedure Laterality Date  . ABDOMINAL HYSTERECTOMY     vaginal- partial  . BACK SURGERY  1981   lower lumbar  . ESOPHAGOGASTRODUODENOSCOPY N/A 10/14/2015   Procedure: ESOPHAGOGASTRODUODENOSCOPY (EGD);  Surgeon: Clarene Essex, MD;  [facility] ENDOSCOPY;  Service: Endoscopy;  Laterality: N/A;  Bedside in ICU  . LAPAROSCOPIC RIGHT COLECTOMY Right 10/22/2013   Procedure: LAPAROSCOPIC RIGHT COLECTOMY ;  Surgeon: [name], MD;  Location: [facility]S;  Service: General;  Laterality: Right;  . SALPINGOOPHORECTOMY  10/22/2013   Procedure: BILATERALSALPINGO OOPHORECTOMY;  Surgeon: Imagene Gurney A. [name], MD;  Location: [facility];  Service: Gynecology;;  . [place]   Past Medical History:  Diagnosis Date  . Anemia   . Blood transfusion without reported diagnosis jan 2015  . C. difficile colitis 03/05/14, 03/22/14, 04/05/14   recurrent  . Cancer ([place])    liver  . Cervical cancer ([place]) 1972  . Colon cancer ([place]) JAN 2015  . Colon cancer ([place])   . Complication of anesthesia    slow to awaken in past, DONE WELL RECENTLY  . DVT (deep venous thrombosis) ([place]) 10/24/13   Right leg  . Heart murmur   . Hypertension  no bp meds   . Liver cyst   . Stroke ([place]) 12/2014   NUMBNESS ON LEFT SIDE  . Tinnitus of both ears ALL THE TIME   BP (!) 147/78   Pulse 76   Resp 14   SpO2 97%   Opioid Risk Score:   Fall Risk Score:  `1  Depression screen PHQ 2/9  Depression screen [facility] 2/9 05/04/2016 12/23/2015  Decreased Interest 0 0  Down, Depressed, Hopeless 0 0  PHQ - 2 Score 0 0    Review of Systems  All other systems reviewed and are negative.      Objective:   Physical Exam  HEENT: Normocephalic. Atraumatic.  Cardio: RRR  Resp: CTA B/L  GI: BS positive and NT, ND  Skin: Intact. Warm and dry.  Neuro: Alert/Oriented  Motor 5/5 B/l UE now---B/l LE 4-/5 hip flexion, 4/5 knee extension, Right ADF 2/5, right APF 4, Left ADF 3+ and left APF 4/5. Decreased LT and  PP in stocking glove distribution bilateral arms/hand and legs/feet. Able to clear feet easily using walker. Cannot stand on heels Musc/Skel: No tenderness. No edema  Gen: NAD. Vital signs reviewed.  Psych: Mood, affect and behavior appropriate.   Assessment & Plan:   1. Quadriplegia, numbness, gait instability secondary to GBS  -continue HEP -cane for balance -local edema control, TEDS, elevation 2  Pain Management: Tylenol prn.  -gabapentin 400mg  tid  with addnl dose at night as needed 4. Neurogenic bladder-resolved                5. Colon cancer with liver mets: - per Dr. Benay Spice ----in clinical remission!!      15 minutes of face to face patient care time were spent during this visit. All questions were encouraged and answered.  Follow up with me in about 6 months

## 2016-05-11 ENCOUNTER — Ambulatory Visit (HOSPITAL_BASED_OUTPATIENT_CLINIC_OR_DEPARTMENT_OTHER): Payer: Medicare Other | Admitting: Nurse Practitioner

## 2016-05-11 ENCOUNTER — Telehealth: Payer: Self-pay

## 2016-05-11 ENCOUNTER — Other Ambulatory Visit (HOSPITAL_BASED_OUTPATIENT_CLINIC_OR_DEPARTMENT_OTHER): Payer: Medicare Other

## 2016-05-11 ENCOUNTER — Telehealth: Payer: Self-pay | Admitting: Oncology

## 2016-05-11 VITALS — BP 152/83 | HR 66 | Temp 98.2°F | Resp 18 | Ht 61.0 in | Wt 140.4 lb

## 2016-05-11 DIAGNOSIS — Z86718 Personal history of other venous thrombosis and embolism: Secondary | ICD-10-CM | POA: Diagnosis not present

## 2016-05-11 DIAGNOSIS — I82401 Acute embolism and thrombosis of unspecified deep veins of right lower extremity: Secondary | ICD-10-CM

## 2016-05-11 DIAGNOSIS — C180 Malignant neoplasm of cecum: Secondary | ICD-10-CM | POA: Diagnosis not present

## 2016-05-11 DIAGNOSIS — C189 Malignant neoplasm of colon, unspecified: Secondary | ICD-10-CM

## 2016-05-11 DIAGNOSIS — Z8541 Personal history of malignant neoplasm of cervix uteri: Secondary | ICD-10-CM

## 2016-05-11 DIAGNOSIS — C787 Secondary malignant neoplasm of liver and intrahepatic bile duct: Secondary | ICD-10-CM

## 2016-05-11 LAB — CEA (IN HOUSE-CHCC): CEA (CHCC-In House): 2.56 ng/mL (ref 0.00–5.00)

## 2016-05-11 NOTE — Telephone Encounter (Signed)
Avs report and appointment schedule given to patient, per 05/11/16 los. °

## 2016-05-11 NOTE — Telephone Encounter (Signed)
Called [facility] for recent colonoscopy report, Spoke with Erasmo Downer who states last one was done in Feb of 2016. She will fax this to the [facility].  Fax received copy provided to Ned Card, NP.

## 2016-05-11 NOTE — Telephone Encounter (Signed)
-----   Message from Owens Shark, NP sent at 05/11/2016  2:00 PM EDT ----- Please call [facility] for most recent colonoscopy report. Thanks

## 2016-05-11 NOTE — Progress Notes (Addendum)
[facility] OFFICE PROGRESS NOTE   Diagnosis:  Colon cancer  INTERVAL HISTORY:   Cynthia [name] returns as scheduled. No change in bowel habits. No bloody or black stools. No abdominal pain. No nausea or vomiting. She has a good appetite. She feels she is becoming stronger. She is walking with a cane.  Objective:  Vital signs in last 24 hours:  Blood pressure (!) 152/83, pulse 66, temperature 98.2 F (36.8 C), temperature source Oral, resp. rate 18, height 5' 1" (1.549 m), weight 140 lb 6.4 oz (63.7 kg), SpO2 96 %.    HEENT: No neck mass. Lymphatics: No palpable cervical, supra clavicular, axillary or inguinal lymph nodes. Resp: Lungs clear bilaterally. Cardio: Regular rate and rhythm. GI: Abdomen soft and nontender. No hepatomegaly. Vascular: No leg edema.   Lab Results:  Lab Results  Component Value Date   WBC 5.3 01/29/2016   HGB 11.7 01/29/2016   HCT 37.2 01/29/2016   MCV 88.8 01/29/2016   PLT 243 01/29/2016   NEUTROABS 3 12/07/2015    Imaging:  No results found.  Medications: I have reviewed the patient's current medications.  Assessment/Plan: 1. Moderately differentiated adenocarcinoma of the cecum, stage III (T4a, N1), status post a laparoscopic assisted right colectomy 10/22/2013  The tumor returned microsatellite stable with normal mismatch repair protein expression   Cycle 1 adjuvant Xeloda 12/25/2013.   Cycle 2 adjuvant Xeloda 01/15/2014.   Cycle 3 adjuvant Xeloda 02/05/2014.   Cycle 4 adjuvant Xeloda 03/18/2014, discontinued after 4 days secondary to diarrhea   Cycle 5 adjuvant Xeloda 04/16/2014, Xeloda dose reduced to 1000 mg in the a.m. and 500 mg in the p.m.   Cycle 6 adjuvant Xeloda 05/07/2014.  Cycle 7 adjuvant Xeloda 05/28/2014.  Cycle 8 adjuvant Xeloda 06/18/2014.  Restaging CTs on 07/14/2014 with no evidence of metastatic colon cancer  Colonoscopy 10/02/2014 with diverticulosis in the sigmoid colon and in the  descending colon. One 4 mm polyp in the sigmoid colon with complete resection. Polyp tissue not retrieved. Internal hemorrhoids. Next colonoscopy in 3 years for surveillance.  Restaging CTs 07/16/2015 with 2 new hypodense liver lesions concerning for metastases, no other evidence of disease progression  PET scan 07/28/2015 with a single hypermetabolic right liver lesion, no other evidence of metastatic disease  Ultrasound-guided biopsy of the right liver lesion 07/30/2015 confirmed metastatic colon cancer  MRI abdomen 08/20/2015 with similar to slight decrease in size of 2 hepatic metastasis. No new liver lesions or extrahepatic metastatic disease identified.  Radiofrequency ablation of 2 liver lesions 09/25/2015  MRI abdomen 02/02/2016-coagulative necrosis at the 2 ablated liver tumor sites, with no enhancement to suggest residual or recurrent liver tumor. No new liver masses or other findings of metastatic disease in the abdomen. 2. Right ovary cystadenofibroma, status post a bilateral oophorectomy 10/22/2013 3. Remote history of cervical cancer. 4. Iron deficiency anemia-resolved. 5. Right calf deep vein thrombosis 10/24/2013-she completed 3 months of xarelto. 6. Indeterminate 12 mm hypermetabolic right pelvic density on the staging PET scan 10/04/2013-CT followup recommended per the GI tumor conference 11/13/2013. No longer visualized on a CT 07/14/2014. 7. Soft tissue implant overlying the posterior right liver on the CT 90/24/0973-[facility] hypermetabolic on the PET scan 99/24/2683. 8. C. difficile colitis 03/05/2014. She completed a course of Flagyl, recurrent diarrhea beginning 03/21/2014, positive C. difficile toxin 03/26/2014-resumed Flagyl for planned 10 day course.  Vancomycin beginning 04/08/2014.  Taper complete 05/21/2014.  9. CVA May 2016 10. Skin infection at the right first MTP joint- Keflex  prescribed 08/06/2015 11.  Guillan-Barre syndrome February 2017 12.  Left lower  extremity DVTs March 2017-treated with Xarelto   Disposition: Cynthia [name] remains in clinical remission from colon cancer. We will follow-up on the CEA from today. We will schedule a follow-up MRI of the abdomen in December of this year which will be at a six-month interval from the previous. She will return for a follow-up visit a few days after the MRI to review the results. She will contact the office in the interim with any problems.  Plan reviewed with Dr. Benay Spice.    Ned Card ANP/GNP-BC   05/11/2016  12:33 PM

## 2016-05-12 LAB — CEA: CEA1: 3.1 ng/mL (ref 0.0–4.7)

## 2016-05-16 ENCOUNTER — Telehealth: Payer: Self-pay | Admitting: *Deleted

## 2016-05-16 NOTE — Telephone Encounter (Signed)
Message left on patient's private phone to inform her that CEA is normal.  Instructed patient to call back with any questions or concerns.

## 2016-05-16 NOTE — Telephone Encounter (Signed)
-----   Message from Ladell Pier, MD sent at 05/13/2016  4:38 PM EDT ----- Please call patient , cea is normal

## 2016-07-12 ENCOUNTER — Other Ambulatory Visit: Payer: Self-pay | Admitting: Physical Medicine & Rehabilitation

## 2016-07-12 DIAGNOSIS — I825Z9 Chronic embolism and thrombosis of unspecified deep veins of unspecified distal lower extremity: Secondary | ICD-10-CM

## 2016-07-12 DIAGNOSIS — C787 Secondary malignant neoplasm of liver and intrahepatic bile duct: Secondary | ICD-10-CM

## 2016-07-12 DIAGNOSIS — C189 Malignant neoplasm of colon, unspecified: Secondary | ICD-10-CM

## 2016-08-10 ENCOUNTER — Other Ambulatory Visit (HOSPITAL_BASED_OUTPATIENT_CLINIC_OR_DEPARTMENT_OTHER): Payer: Medicare Other

## 2016-08-10 ENCOUNTER — Ambulatory Visit (HOSPITAL_COMMUNITY)
Admission: RE | Admit: 2016-08-10 | Discharge: 2016-08-10 | Disposition: A | Discharge status: Home / Self Care | Payer: Medicare Other | Source: Ambulatory Visit | Attending: Nurse Practitioner | Admitting: Nurse Practitioner

## 2016-08-10 DIAGNOSIS — K828 Other specified diseases of gallbladder: Secondary | ICD-10-CM | POA: Insufficient documentation

## 2016-08-10 DIAGNOSIS — C787 Secondary malignant neoplasm of liver and intrahepatic bile duct: Secondary | ICD-10-CM | POA: Insufficient documentation

## 2016-08-10 DIAGNOSIS — C189 Malignant neoplasm of colon, unspecified: Secondary | ICD-10-CM | POA: Diagnosis not present

## 2016-08-10 DIAGNOSIS — C180 Malignant neoplasm of cecum: Secondary | ICD-10-CM

## 2016-08-10 LAB — COMPREHENSIVE METABOLIC PANEL
ALT: 16 U/L (ref 0–55)
ANION GAP: 8 meq/L (ref 3–11)
AST: 25 U/L (ref 5–34)
Albumin: 3.9 g/dL (ref 3.5–5.0)
Alkaline Phosphatase: 62 U/L (ref 40–150)
BUN: 15 mg/dL (ref 7.0–26.0)
CHLORIDE: 106 meq/L (ref 98–109)
CO2: 27 meq/L (ref 22–29)
Calcium: 9.9 mg/dL (ref 8.4–10.4)
Creatinine: 0.8 mg/dL (ref 0.6–1.1)
EGFR: 68 mL/min/{1.73_m2} — AB (ref >90)
Glucose: 84 mg/dl (ref 70–140)
POTASSIUM: 4.6 meq/L (ref 3.5–5.1)
Sodium: 142 mEq/L (ref 136–145)
Total Bilirubin: 0.69 mg/dL (ref 0.20–1.20)
Total Protein: 7 g/dL (ref 6.4–8.3)

## 2016-08-10 LAB — CEA (IN HOUSE-CHCC): CEA (CHCC-In House): 2.89 ng/mL (ref 0.00–5.00)

## 2016-08-10 MED ORDER — GADOBENATE DIMEGLUMINE 529 MG/ML IV SOLN
15.0000 mL | Freq: Once | INTRAVENOUS | Status: AC | PRN
  Administered 2016-08-10: 13 mL via INTRAVENOUS

## 2016-08-11 LAB — CEA: CEA: 3.4 ng/mL (ref 0.0–4.7)

## 2016-08-12 ENCOUNTER — Ambulatory Visit (HOSPITAL_BASED_OUTPATIENT_CLINIC_OR_DEPARTMENT_OTHER): Payer: Medicare Other | Admitting: Oncology

## 2016-08-12 ENCOUNTER — Telehealth: Payer: Self-pay | Admitting: Oncology

## 2016-08-12 VITALS — BP 157/79 | HR 64 | Temp 97.7°F | Resp 18 | Ht 61.0 in | Wt 141.0 lb

## 2016-08-12 DIAGNOSIS — Z8541 Personal history of malignant neoplasm of cervix uteri: Secondary | ICD-10-CM | POA: Diagnosis not present

## 2016-08-12 DIAGNOSIS — I82401 Acute embolism and thrombosis of unspecified deep veins of right lower extremity: Secondary | ICD-10-CM

## 2016-08-12 DIAGNOSIS — C189 Malignant neoplasm of colon, unspecified: Secondary | ICD-10-CM

## 2016-08-12 DIAGNOSIS — C787 Secondary malignant neoplasm of liver and intrahepatic bile duct: Secondary | ICD-10-CM | POA: Diagnosis not present

## 2016-08-12 DIAGNOSIS — C180 Malignant neoplasm of cecum: Secondary | ICD-10-CM | POA: Diagnosis not present

## 2016-08-12 NOTE — Telephone Encounter (Signed)
Appointments scheduled per 08/12/16 los. Patient was given a copy of the AVS report and appointment schedule, per 08/12/16 los. °

## 2016-08-12 NOTE — Progress Notes (Signed)
[place] OFFICE PROGRESS NOTE   Diagnosis: colon cancer  INTERVAL HISTORY:   Cynthia [name] returns as scheduled. She feels well. Good appetite. No difficulty with bowel function. She continues to have balance difficulty and numbness/tingling of the feet following the [place] Barre syndrome. Gabapentin helps the tingling.  Objective:  Vital signs in last 24 hours:  Blood pressure (!) 157/79, pulse 64, temperature 97.7 F (36.5 C), temperature source Oral, resp. rate 18, height 5' 1" (1.549 m), weight 141 lb (64 kg), SpO2 100 %.    Physical examination-not performed today  Lab Results:  CEA on 08/10/2016: 2.89   Mr Abdomen W Wo Contrast  Result Date: 08/10/2016 CLINICAL DATA:  80 year old female with history of colon cancer status post ablation of 2 metastatic lesions in the liver on September 25, 2015. Followup study. EXAM: MRI ABDOMEN WITHOUT AND WITH CONTRAST TECHNIQUE: Multiplanar multisequence MR imaging of the abdomen was performed both before and after the administration of intravenous contrast. CONTRAST:  72m MULTIHANCE GADOBENATE DIMEGLUMINE 529 MG/ML IV SOLN COMPARISON:  MRI of the abdomen 02/02/2016. FINDINGS: Lower chest: Unremarkable. Hepatobiliary: In segment 6 of the liver there is again an ovoid shaped area that is T1 hyperintense, T2 hypointense, without definitive internal enhancement, measuring 3.0 x 2.5 cm (axial image 59 of series 1003), compatible with an area of coagulative necrosis at site of previously ablated lesion. A smaller but similar appearing lesion is also noted in the superior aspect of segment 8, best demonstrated on axial image 20 of series 1005 measuring 0.9 x 2.0 cm, also compatible with an area of coagulative necrosis. No discrete enhancing hepatic lesions are noted to suggest new metastatic lesions. There are 3 well-defined T1 hypointense, T2 hyperintense, nonenhancing lesions are noted in the left lobe of the liver, largest of which  measures up to 5.8 cm in diameter centered predominantly in segment 3. No intra or extrahepatic biliary ductal dilatation. Amorphous T1 hyperintense material throughout the lumen of the gallbladder likely to reflect biliary sludge. No findings to suggest an acute cholecystitis at this time. Pancreas: No pancreatic mass. No pancreatic ductal dilatation. No pancreatic or peripancreatic fluid or inflammatory changes. Spleen:  Unremarkable. Adrenals/Urinary Tract: Bilateral adrenal glands and bilateral kidneys are normal in appearance. There is no hydroureteronephrosis in the visualized abdomen. Stomach/Bowel: Visualized portions are unremarkable. Vascular/Lymphatic: No aneurysm identified in the visualized abdominal vasculature. No lymphadenopathy noted in the abdomen. Other: No significant volume of ascites noted in the visualized portions of the abdomen. Musculoskeletal: No aggressive osseous lesions are noted in the visualized portions of the skeleton. IMPRESSION: 1. Stable post ablation appearance of 2 lesions in the liver, as detailed above. No findings to suggest local recurrence of disease or new metastatic disease in the liver. 2. Biliary sludge in the gallbladder. Gallbladder is nearly decompressed, and there are no surrounding inflammatory changes to suggest an acute cholecystitis at this time. 3. Additional incidental findings, as above. Electronically Signed   By: DVinnie LangtonM.D.   On: 08/10/2016 15:36    Medications: I have reviewed the patient's current medications.  Assessment/Plan: 1. Moderately differentiated adenocarcinoma of the cecum, stage III (T4a, N1), status post a laparoscopic assisted right colectomy 10/22/2013  The tumor returned microsatellite stable with normal mismatch repair protein expression   Cycle 1 adjuvant Xeloda 12/25/2013.   Cycle 2 adjuvant Xeloda 01/15/2014.   Cycle 3 adjuvant Xeloda 02/05/2014.   Cycle 4 adjuvant Xeloda 03/18/2014, discontinued after 4  days secondary to diarrhea  Cycle 5 adjuvant Xeloda 04/16/2014, Xeloda dose reduced to 1000 mg in the a.m. and 500 mg in the p.m.   Cycle 6 adjuvant Xeloda 05/07/2014.  Cycle 7 adjuvant Xeloda 05/28/2014.  Cycle 8 adjuvant Xeloda 06/18/2014.  Restaging CTs on 07/14/2014 with no evidence of metastatic colon cancer  Colonoscopy 10/02/2014 with diverticulosis in the sigmoid colon and in the descending colon. One 4 mm polyp in the sigmoid colon with complete resection. Polyp tissue not retrieved. Internal hemorrhoids. Next colonoscopy in 3 years for surveillance.  Restaging CTs 07/16/2015 with 2 new hypodense liver lesions concerning for metastases, no other evidence of disease progression  PET scan 07/28/2015 with a single hypermetabolic right liver lesion, no other evidence of metastatic disease  Ultrasound-guided biopsy of the right liver lesion 07/30/2015 confirmed metastatic colon cancer  MRI abdomen 08/20/2015 with similar to slight decrease in size of 2 hepatic metastasis. No new liver lesions or extrahepatic metastatic disease identified.  Radiofrequency ablation of 2 liver lesions 09/25/2015  MRI abdomen 02/02/2016-coagulative necrosis at the 2 ablated liver tumor sites, with no enhancement to suggest residual or recurrent liver tumor. No new liver masses or other findings of metastatic disease in the abdomen.  MRI abdomen 08/10/2016 revealed stable ablation sites, no evidence of progressive metastatic disease 2. Right ovary cystadenofibroma, status post a bilateral oophorectomy 10/22/2013 3. Remote history of cervical cancer. 4. Iron deficiency anemia-resolved. 5. Right calf deep vein thrombosis 10/24/2013-she completed 3 months of xarelto. 6. Indeterminate 12 mm hypermetabolic right pelvic density on the staging PET scan 10/04/2013-CT followup recommended per the GI tumor conference 11/13/2013. No longer visualized on a CT 07/14/2014. 7. Soft tissue implant overlying the  posterior right liver on the CT 57/26/2035-DHR hypermetabolic on the PET scan 41/63/8453. 8. C. difficile colitis 03/05/2014. She completed a course of Flagyl, recurrent diarrhea beginning 03/21/2014, positive C. difficile toxin 03/26/2014-resumed Flagyl for planned 10 day course.  Vancomycin beginning 04/08/2014.  Taper complete 05/21/2014.  9. CVA May 2016 10. Skin infection at the right first MTP joint-Keflex prescribed 08/06/2015 11. Guillan-Barre syndrome February 2017 12. Left lower extremity DVTs March 2017-treated with Xarelto    Disposition:  Ms. Ritacco remains in clinical remission from colon cancer. She will return for an office visit and CEA in 3 months.  Cynthia Coder, MD  08/12/2016  12:47 PM

## 2016-08-23 ENCOUNTER — Other Ambulatory Visit (HOSPITAL_COMMUNITY): Payer: Self-pay | Admitting: Interventional Radiology

## 2016-08-23 DIAGNOSIS — C787 Secondary malignant neoplasm of liver and intrahepatic bile duct: Secondary | ICD-10-CM

## 2016-09-07 ENCOUNTER — Other Ambulatory Visit: Payer: Self-pay | Admitting: Physical Medicine & Rehabilitation

## 2016-09-07 ENCOUNTER — Encounter: Payer: Self-pay | Admitting: Radiology

## 2016-09-07 ENCOUNTER — Ambulatory Visit
Admission: RE | Admit: 2016-09-07 | Discharge: 2016-09-07 | Disposition: A | Discharge status: Home / Self Care | Payer: Medicare Other | Source: Ambulatory Visit | Attending: Interventional Radiology | Admitting: Interventional Radiology

## 2016-09-07 DIAGNOSIS — C787 Secondary malignant neoplasm of liver and intrahepatic bile duct: Secondary | ICD-10-CM

## 2016-09-07 HISTORY — PX: IR GENERIC HISTORICAL: IMG1180011

## 2016-09-07 NOTE — Progress Notes (Signed)
Chief Complaint: Patient was seen in consultation today for  Chief Complaint  Patient presents with  . Follow-up    1 yr follow up Thermal Ablation of Liver    Referring Physician(s): Dr. Benay Spice  Supervising Physician: Aletta Edouard  History of Present Illness: Cynthia [name] is a 81 y.o. female with original diagnosis of invasive adenocarcinoma of the cecum in February, 2015.   She underwent right hemicolectomy and lymph node dissection on 10/22/2013 demonstrating a 7 cm maximum diameter cecal adenocarcinoma with moderate differentiation  CT on 07/16/2015 demonstrated 2 new liver lesions with a roughly 1.7 cm inferior right lobe lesion in segment 6 and a 1.2 cm lesion in the superior right lobe in segment 7.   PET scan on 07/28/2015 demonstrated increased metabolic activity in the inferior 1.7 cm right lobe liver lesion with SUV max of 6.5.    She was seen in consult initally by Dr. Kathlene Cote on 08/18/2015.   She underwent CT guided microwave ablation on 09/25/2015.  12 days later she presented to the ED with tingling and difficulty walking. She as diagnosed with [name].  She was in the hospital and then rehad for a combined 3 months.  She is doing much better now from that standpoint. She does still have some tingling and balance difficulty but she states she feels she is improving.  She is sick today and reports a "head cold". She denies fever or flu like symptoms.  She is seen in our clinic today for scheduled follow up.  MRI done 08/10/2016 shows stable post ablation appearance of 2 lesions in the liver, as detailed above. No findings to suggest local recurrence of disease or new metastatic disease in the liver.  Past Medical History:  Diagnosis Date  . Anemia   . Blood transfusion without reported diagnosis jan 2015  . C. difficile colitis 03/05/14, 03/22/14, 04/05/14   recurrent  . Cancer ([place])    liver  . Cervical cancer ([place]) 1972  . Colon  cancer ([place]) JAN 2015  . Colon cancer ([place])   . Complication of anesthesia    slow to awaken in past, DONE WELL RECENTLY  . DVT (deep venous thrombosis) ([place]) 10/24/13   Right leg  . Heart murmur   . Hypertension    no bp meds   . Liver cyst   . Stroke ([place]) 12/2014   NUMBNESS ON LEFT SIDE  . Tinnitus of both ears ALL THE TIME    Past Surgical History:  Procedure Laterality Date  . ABDOMINAL HYSTERECTOMY     vaginal- partial  . BACK SURGERY  1981   lower lumbar  . ESOPHAGOGASTRODUODENOSCOPY N/A 10/14/2015   Procedure: ESOPHAGOGASTRODUODENOSCOPY (EGD);  Surgeon: Clarene Essex, MD;  [facility];  Service: Endoscopy;  Laterality: N/A;  Bedside in ICU  . LAPAROSCOPIC RIGHT COLECTOMY Right 10/22/2013   Procedure: LAPAROSCOPIC RIGHT COLECTOMY ;  Surgeon: [name], MD;  Location: [facility] ORS;  Service: General;  Laterality: Right;  . SALPINGOOPHORECTOMY  10/22/2013   Procedure: BILATERALSALPINGO OOPHORECTOMY;  Surgeon: Imagene Gurney A. [name], MD;  Location: [facility] ORS;  Service: Gynecology;;  . TUBAL LIGATION  1968    Allergies: Patient has no known allergies.  Medications: Prior to Admission medications   Medication Sig Start Date End Date Taking? Authorizing Provider  acetaminophen (TYLENOL) 500 MG tablet Take 500 mg by mouth every 6 (six) hours as needed for mild pain.   Yes Historical Provider, MD  atorvastatin (LIPITOR) 40 MG tablet  Take 1 tablet (40 mg total) by mouth daily. 01/13/15  Yes Ashly M Gottschalk, DO  gabapentin (NEURONTIN) 400 MG capsule Take 1 capsule (400 mg total) by mouth as directed. Take three x daily and as needed at night. 05/04/16  Yes Meredith Staggers, MD  metoprolol succinate (TOPROL-XL) 25 MG 24 hr tablet Take 37.5 mg by mouth every evening.  12/20/14  Yes Historical Provider, MD  Multiple Vitamin (MULTIVITAMIN WITH MINERALS) TABS tablet Take 1 tablet by mouth every morning.    Yes Historical Provider, MD  pantoprazole (PROTONIX) 40 MG tablet TAKE 1 TABLET BY  MOUTH EVERY DAY 07/13/16  Yes Meredith Staggers, MD     Family History  Problem Relation Age of Onset  . Diabetes Sister   . Cancer Sister     leukemia  . Diabetes Brother   . Heart failure Brother   . Hypertension Brother   . Breast cancer Maternal Aunt   . Rheum arthritis Mother   . Heart failure Father   . Cancer Maternal Grandmother     GIST  . Heart failure Maternal Grandfather   . Aneurysm Paternal Grandmother     Social History   Social History  . Marital status: Widowed    Spouse name: N/A  . Number of children: N/A  . Years of education: N/A   Social History Main Topics  . Smoking status: Never Smoker  . Smokeless tobacco: Never Used  . Alcohol use No  . Drug use: No  . Sexual activity: No   Other Topics Concern  . Not on file   Social History Narrative  . No narrative on file    Review of Systems: A 12 point ROS discussed  Review of Systems  Constitutional: Positive for activity change and fatigue. Negative for appetite change, chills and fever.  HENT: Positive for congestion, postnasal drip, rhinorrhea and sore throat.   Respiratory: Negative.   Cardiovascular: Negative.   Gastrointestinal: Negative.   Genitourinary: Negative.   Musculoskeletal: Negative.   Neurological: Negative.   Hematological: Negative.   Psychiatric/Behavioral: Negative.     Vital Signs: BP (!) 146/85 (BP Location: Left Arm, Patient Position: Sitting, Cuff Size: Normal)   Pulse 82   Temp 98.4 F (36.9 C) (Oral)   Resp 16   Ht 5\' 1"  (1.549 m)   Wt 140 lb (63.5 kg)   SpO2 98%   BMI 26.45 kg/m   Physical Exam  Constitutional: She is oriented to person, place, and time. She appears well-developed and well-nourished.  HENT:  Head: Normocephalic and atraumatic.  Eyes: EOM are normal.  Neck: Normal range of motion.  Cardiovascular: Normal rate, regular rhythm and normal heart sounds.   Pulmonary/Chest: Effort normal and breath sounds normal. No respiratory distress.  She has no wheezes.  Abdominal: Soft. She exhibits no distension. There is no tenderness.  Musculoskeletal: Normal range of motion.  Neurological: She is alert and oriented to person, place, and time.  Skin: Skin is warm and dry.  Psychiatric: She has a normal mood and affect. Her behavior is normal. Judgment and thought content normal.  Vitals reviewed.    Imaging: Mr Abdomen W Wo Contrast  Result Date: 08/10/2016 CLINICAL DATA:  81 year old female with history of colon cancer status post ablation of 2 metastatic lesions in the liver on September 25, 2015. Followup study. EXAM: MRI ABDOMEN WITHOUT AND WITH CONTRAST TECHNIQUE: Multiplanar multisequence MR imaging of the abdomen was performed both before and after the administration of intravenous  contrast. CONTRAST:  74mL MULTIHANCE GADOBENATE DIMEGLUMINE 529 MG/ML IV SOLN COMPARISON:  MRI of the abdomen 02/02/2016. FINDINGS: Lower chest: Unremarkable. Hepatobiliary: In segment 6 of the liver there is again an ovoid shaped area that is T1 hyperintense, T2 hypointense, without definitive internal enhancement, measuring 3.0 x 2.5 cm (axial image 59 of series 1003), compatible with an area of coagulative necrosis at site of previously ablated lesion. A smaller but similar appearing lesion is also noted in the superior aspect of segment 8, best demonstrated on axial image 20 of series 1005 measuring 0.9 x 2.0 cm, also compatible with an area of coagulative necrosis. No discrete enhancing hepatic lesions are noted to suggest new metastatic lesions. There are 3 well-defined T1 hypointense, T2 hyperintense, nonenhancing lesions are noted in the left lobe of the liver, largest of which measures up to 5.8 cm in diameter centered predominantly in segment 3. No intra or extrahepatic biliary ductal dilatation. Amorphous T1 hyperintense material throughout the lumen of the gallbladder likely to reflect biliary sludge. No findings to suggest an acute cholecystitis at  this time. Pancreas: No pancreatic mass. No pancreatic ductal dilatation. No pancreatic or peripancreatic fluid or inflammatory changes. Spleen:  Unremarkable. Adrenals/Urinary Tract: Bilateral adrenal glands and bilateral kidneys are normal in appearance. There is no hydroureteronephrosis in the visualized abdomen. Stomach/Bowel: Visualized portions are unremarkable. Vascular/Lymphatic: No aneurysm identified in the visualized abdominal vasculature. No lymphadenopathy noted in the abdomen. Other: No significant volume of ascites noted in the visualized portions of the abdomen. Musculoskeletal: No aggressive osseous lesions are noted in the visualized portions of the skeleton. IMPRESSION: 1. Stable post ablation appearance of 2 lesions in the liver, as detailed above. No findings to suggest local recurrence of disease or new metastatic disease in the liver. 2. Biliary sludge in the gallbladder. Gallbladder is nearly decompressed, and there are no surrounding inflammatory changes to suggest an acute cholecystitis at this time. 3. Additional incidental findings, as above. Electronically Signed   By: Vinnie Langton M.D.   On: 08/10/2016 15:36    Labs:  CBC:  Recent Labs  11/30/15 0441 12/01/15 12/01/15 0556 12/07/15 01/29/16 0914  WBC 4.6 4.3 4.3 4.8  4.8 5.3  HGB 10.1*  --  10.6* 11.0* 11.7  HCT 32.6*  --  34.2* 35* 37.2  PLT 233  --  241 259 243    COAGS:  Recent Labs  09/21/15 1035 10/06/15 0612 10/12/15 1200 11/27/15 1622 11/28/15 0627 01/29/16 0914  INR 1.03 1.19  --  1.22 1.22 1.1  APTT 28 28 27   --   --   --     BMP:  Recent Labs  11/13/15 0520 11/19/15 0511  11/25/15 0742 12/07/15 01/29/16 0914 08/10/16 1227  NA 138 139  < > 140 144 145 142  K 4.1 4.4  --  4.3 4.5 4.4 4.6  CL 104 107  --  108  --  106  --   CO2 27 24  --  23  --  27 27  GLUCOSE 96 90  --  90  --  81 84  BUN 10 12  < > 9 10 13  15.0  CALCIUM 8.8* 9.0  --  9.0  --  9.2 9.9  CREATININE 0.51 0.62  <  > 0.52 0.6 0.66 0.8  GFRNONAA >60 >60  --  >60  --  85  --   GFRAA >60 >60  --  >60  --  >89  --   < > =  values in this interval not displayed.  LIVER FUNCTION TESTS:  Recent Labs  10/25/15 0801 10/28/15 0652 12/07/15 01/29/16 0914 08/10/16 1227  BILITOT 1.0 0.9  --  0.4 0.69  AST 32 23 12* 20 25  ALT 33 20 7 11 16   ALKPHOS 46 42 49 37 62  PROT 6.6 6.1*  --  6.0* 7.0  ALBUMIN 2.2* 2.0*  --  3.5* 3.9    TUMOR MARKERS:  Recent Labs  01/07/16 1420  CEA 0.9    Assessment:  Metastatic adenocarcinoma from the cecum.  Status post thermal ablation by Dr. Kathlene Cote on 09/25/2015  MRI shows stable post ablation appearance of 2 lesions in the liver, as detailed above. No findings to suggest local recurrence of disease or new metastatic disease in the liver.  F/U in 6 months with repeat MRI.  CT scan may also be sufficient to follow ablation sites and re-stage disease if needed per Dr. Benay Spice.  Electronically Signed: Murrell Redden PA-C 09/07/2016, 3:15 PM   Please refer to Dr. Margaretmary Dys attestation of this note for management and plan.

## 2016-10-05 ENCOUNTER — Other Ambulatory Visit: Payer: Self-pay | Admitting: Physical Medicine & Rehabilitation

## 2016-10-05 DIAGNOSIS — C787 Secondary malignant neoplasm of liver and intrahepatic bile duct: Secondary | ICD-10-CM

## 2016-10-05 DIAGNOSIS — I825Z9 Chronic embolism and thrombosis of unspecified deep veins of unspecified distal lower extremity: Secondary | ICD-10-CM

## 2016-10-05 DIAGNOSIS — C189 Malignant neoplasm of colon, unspecified: Secondary | ICD-10-CM

## 2016-10-05 NOTE — Telephone Encounter (Signed)
Patient requesting a refill of protonix, no mention in previous notes to continue this medication, please advise

## 2016-10-31 ENCOUNTER — Encounter: Payer: Self-pay | Admitting: Physical Medicine & Rehabilitation

## 2016-10-31 ENCOUNTER — Encounter: Payer: Medicare Other | Attending: Physical Medicine & Rehabilitation | Admitting: Physical Medicine & Rehabilitation

## 2016-10-31 VITALS — BP 162/99 | HR 74

## 2016-10-31 DIAGNOSIS — Z8541 Personal history of malignant neoplasm of cervix uteri: Secondary | ICD-10-CM | POA: Insufficient documentation

## 2016-10-31 DIAGNOSIS — Z85038 Personal history of other malignant neoplasm of large intestine: Secondary | ICD-10-CM | POA: Insufficient documentation

## 2016-10-31 DIAGNOSIS — Z8249 Family history of ischemic heart disease and other diseases of the circulatory system: Secondary | ICD-10-CM | POA: Diagnosis not present

## 2016-10-31 DIAGNOSIS — G8250 Quadriplegia, unspecified: Secondary | ICD-10-CM | POA: Diagnosis not present

## 2016-10-31 DIAGNOSIS — M21531 Acquired clawfoot, right foot: Secondary | ICD-10-CM

## 2016-10-31 DIAGNOSIS — G610 Guillain-Barre syndrome: Secondary | ICD-10-CM

## 2016-10-31 DIAGNOSIS — Z833 Family history of diabetes mellitus: Secondary | ICD-10-CM | POA: Insufficient documentation

## 2016-10-31 DIAGNOSIS — Z8673 Personal history of transient ischemic attack (TIA), and cerebral infarction without residual deficits: Secondary | ICD-10-CM | POA: Diagnosis not present

## 2016-10-31 DIAGNOSIS — Z9851 Tubal ligation status: Secondary | ICD-10-CM | POA: Insufficient documentation

## 2016-10-31 DIAGNOSIS — Z5189 Encounter for other specified aftercare: Secondary | ICD-10-CM | POA: Diagnosis not present

## 2016-10-31 DIAGNOSIS — Z809 Family history of malignant neoplasm, unspecified: Secondary | ICD-10-CM | POA: Insufficient documentation

## 2016-10-31 DIAGNOSIS — Z90710 Acquired absence of both cervix and uterus: Secondary | ICD-10-CM | POA: Diagnosis not present

## 2016-10-31 DIAGNOSIS — I10 Essential (primary) hypertension: Secondary | ICD-10-CM | POA: Diagnosis not present

## 2016-10-31 DIAGNOSIS — R200 Anesthesia of skin: Secondary | ICD-10-CM | POA: Diagnosis not present

## 2016-10-31 DIAGNOSIS — R269 Unspecified abnormalities of gait and mobility: Secondary | ICD-10-CM | POA: Diagnosis not present

## 2016-10-31 DIAGNOSIS — R531 Weakness: Secondary | ICD-10-CM | POA: Insufficient documentation

## 2016-10-31 DIAGNOSIS — Z8505 Personal history of malignant neoplasm of liver: Secondary | ICD-10-CM | POA: Diagnosis not present

## 2016-10-31 NOTE — Patient Instructions (Signed)
PLEASE FEEL FREE TO CALL OUR OFFICE WITH ANY PROBLEMS OR QUESTIONS ([phone])      

## 2016-10-31 NOTE — Progress Notes (Signed)
Subjective:    Patient ID: Cynthia [name], female    DOB: [birth year], 81 y.o.   MRN: [id number]  HPI   Cynthia [name] is here in follow up of her GBS. She has been doing quite well for the most part. She still has some weakness in her feet with associated sensory loss. She remains on the gabapentin for pain relief and uses tylenol when it gets bad. She is getting back to the yard and outdoors albeit with some adaptations and different approaches.   She uses a cane for balance. She is not using any orthotics. She has noticed that her toes are curling up in her right foot and wonders what she should be doing about them.  Otherwise form a health standpoint, she's doing well.    Pain Inventory Average Pain 4 Pain Right Now 4 My pain is tingling  In the last 24 hours, has pain interfered with the following? General activity 4 Relation with others 4 Enjoyment of life 4 What TIME of day is your pain at its worst? evening Sleep (in general) Good  Pain is worse with: walking and standing Pain improves with: medication Relief from Meds: 7  Mobility use a cane ability to climb steps?  yes do you drive?  no  Function retired I need assistance with the following:  shopping  Neuro/Psych tingling trouble walking  Prior Studies Any changes since last visit?  no  Physicians involved in your care Any changes since last visit?  no   Family History  Problem Relation Age of Onset  . Diabetes Sister   . Cancer Sister     leukemia  . Diabetes Brother   . Heart failure Brother   . Hypertension Brother   . Breast cancer Maternal Aunt   . Rheum arthritis Mother   . Heart failure Father   . Cancer Maternal Grandmother     GIST  . Heart failure Maternal Grandfather   . Aneurysm Paternal Grandmother    Social History   Social History  . Marital status: Widowed    Spouse name: N/A  . Number of children: N/A  . Years of education: N/A   Social History Main Topics  .  Smoking status: Never Smoker  . Smokeless tobacco: Never Used  . Alcohol use No  . Drug use: No  . Sexual activity: No   Other Topics Concern  . Not on file   Social History Narrative  . No narrative on file   Past Surgical History:  Procedure Laterality Date  . ABDOMINAL HYSTERECTOMY     vaginal- partial  . BACK SURGERY  1981   lower lumbar  . ESOPHAGOGASTRODUODENOSCOPY N/A 10/14/2015   Procedure: ESOPHAGOGASTRODUODENOSCOPY (EGD);  Surgeon: Clarene Essex, MD;  Location[facility] ENDOSCOPY;  Service: Endoscopy;  Laterality: N/A;  Bedside in ICU  . IR GENERIC HISTORICAL  09/07/2016   IR RADIOLOGIST EVAL & MGMT 09/07/2016 Aletta Edouard, MD [facility] INTERV RAD  . LAPAROSCOPIC RIGHT COLECTOMY Right 10/22/2013   Procedure: LAPAROSCOPIC RIGHT COLECTOMY ;  Surgeon: [name], MD;  Location: [facility] ORS;  Service: General;  Laterality: Right;  . SALPINGOOPHORECTOMY  10/22/2013   Procedure: BILATERALSALPINGO OOPHORECTOMY;  Surgeon: Imagene Gurney A. [name], MD;  Location: [facility] ORS;  Service: Gynecology;;  . [place]   Past Medical History:  Diagnosis Date  . Anemia   . Blood transfusion without reported diagnosis jan 2015  . C. difficile colitis 03/05/14, 03/22/14, 04/05/14   recurrent  . Cancer (  [place])    liver  . Cervical cancer ([place]) 1972  . Colon cancer ([place]) JAN 2015  . Colon cancer ([place])   . Complication of anesthesia    slow to awaken in past, DONE WELL RECENTLY  . DVT (deep venous thrombosis) ([place]) 10/24/13   Right leg  . Heart murmur   . Hypertension    no bp meds   . Liver cyst   . Stroke ([place]) 12/2014   NUMBNESS ON LEFT SIDE  . Tinnitus of both ears ALL THE TIME   There were no vitals taken for this visit.  Opioid Risk Score:   Fall Risk Score:  `1  Depression screen PHQ 2/9  Depression screen [facility] 2/9 05/04/2016 12/23/2015  Decreased Interest 0 0  Down, Depressed, Hopeless 0 0  PHQ - 2 Score 0 0  \ Review of Systems  Constitutional: Negative.   HENT: Negative.   Eyes:  Negative.   Respiratory: Negative.   Cardiovascular: Negative.   Gastrointestinal: Negative.   Endocrine: Negative.   Genitourinary: Negative.   Musculoskeletal: Positive for gait problem.  Skin: Negative.   Allergic/Immunologic: Negative.   Hematological: Negative.   Psychiatric/Behavioral: Negative.   All other systems reviewed and are negative.      Objective:   Physical Exam  HEENT: Normocephalic. Atraumatic.  Cardio: RRR  Resp: CTA B/L  GI: BS positive and NT, ND  Skin: Intact. Warm and dry.  Neuro: Alert/Oriented  Motor 5/5 B/l UE now---B/l LE 4-/5 hip flexion, 4/5 knee extension, Right ADF 3/5, right APF 4, Left ADF   4/5 and left APF 4/5. Decreased LT and PP in stocking glove distribution bilateral arms/hand and legs/feet. Able to clear feet easily using walker. Clawfoot deformity (mild) Right foot Musc/Skel: No tenderness. No edema  Gen: NAD. Vital signs reviewed.  Psych: Mood, affect and behavior appropriate.   Assessment & Plan:  1. Quadriplegia, numbness, gait instability secondary to GBS. Has residual sensory loss and claw foot deformity (mild) -continue HEP -cane for balance -reviewed some foot intrinsic exercises to improve ROM and strength 2  Pain Management: Tylenol prn. Can be more liberal -gabapentin 400mg  tid with addnl dose at night as needed 4. Neurogenic bladder-resolved  5. Colon cancer with liver mets: - per Dr. Benay Spice ----in clinical remission!!      15 minutes of face to face patient care time were spent during this visit. All questions were encouraged and answered.  Follow up with me prn. Greater than 50% of time during this encounter was spent counseling patient/family in regard to stretching and strengthening exercises for intrinsic foot muscles.          Assessment & Plan:

## 2016-11-08 ENCOUNTER — Telehealth: Payer: Self-pay | Admitting: Oncology

## 2016-11-08 ENCOUNTER — Ambulatory Visit (HOSPITAL_BASED_OUTPATIENT_CLINIC_OR_DEPARTMENT_OTHER): Payer: Medicare Other | Admitting: Oncology

## 2016-11-08 ENCOUNTER — Other Ambulatory Visit (HOSPITAL_BASED_OUTPATIENT_CLINIC_OR_DEPARTMENT_OTHER): Payer: Medicare Other

## 2016-11-08 VITALS — BP 157/84 | HR 70 | Temp 98.1°F | Resp 17 | Ht 61.0 in | Wt 145.3 lb

## 2016-11-08 DIAGNOSIS — C180 Malignant neoplasm of cecum: Secondary | ICD-10-CM | POA: Diagnosis not present

## 2016-11-08 DIAGNOSIS — C787 Secondary malignant neoplasm of liver and intrahepatic bile duct: Secondary | ICD-10-CM | POA: Diagnosis not present

## 2016-11-08 DIAGNOSIS — Z8541 Personal history of malignant neoplasm of cervix uteri: Secondary | ICD-10-CM | POA: Diagnosis not present

## 2016-11-08 DIAGNOSIS — Z86718 Personal history of other venous thrombosis and embolism: Secondary | ICD-10-CM | POA: Diagnosis not present

## 2016-11-08 DIAGNOSIS — C189 Malignant neoplasm of colon, unspecified: Secondary | ICD-10-CM

## 2016-11-08 LAB — CEA (IN HOUSE-CHCC): CEA (CHCC-In House): 4.31 ng/mL (ref 0.00–5.00)

## 2016-11-08 NOTE — Progress Notes (Signed)
[place] OFFICE PROGRESS NOTE   Diagnosis: Colon cancer  INTERVAL HISTORY:   Cynthia [name] returns as scheduled. She feels well. Good appetite. No pain. She had an upper respiratory infection approximately 1 month ago. The symptoms have improved. She continues to have balance difficulty since the episode of Guillain-Barr.    Objective:  Vital signs in last 24 hours:  Blood pressure (!) 157/84, pulse 70, temperature 98.1 F (36.7 C), temperature source Oral, resp. rate 17, height _0  (1.549 m), weight 145 lb 4.8 oz (65.9 kg), SpO2 96 %.    HEENT:  neck without mass Lymphatics:  no cervical, supraclavicular, axillary, or inguinal nodes Resp:  lungs clear bilaterally Cardio:  regular rate and rhythm GI:  no hepatosplenomegaly, nontender Vascular:  the right lower leg is slightly larger than the left side.   Lab Results:  CEA pending    Medications: I have reviewed the patient's current medications.  Assessment/Plan: 1. Moderately differentiated adenocarcinoma of the cecum, stage III (T4a, N1), status post a laparoscopic assisted right colectomy 10/22/2013  The tumor returned microsatellite stable with normal mismatch repair protein expression   Cycle 1 adjuvant Xeloda 12/25/2013.   Cycle 2 adjuvant Xeloda 01/15/2014.   Cycle 3 adjuvant Xeloda 02/05/2014.   Cycle 4 adjuvant Xeloda 03/18/2014, discontinued after 4 days secondary to diarrhea   Cycle 5 adjuvant Xeloda 04/16/2014, Xeloda dose reduced to 1000 mg in the a.m. and 500 mg in the p.m.   Cycle 6 adjuvant Xeloda 05/07/2014.  Cycle 7 adjuvant Xeloda 05/28/2014.  Cycle 8 adjuvant Xeloda 06/18/2014.  Restaging CTs on 07/14/2014 with no evidence of metastatic colon cancer  Colonoscopy 10/02/2014 with diverticulosis in the sigmoid colon and in the descending colon. One 4 mm polyp in the sigmoid colon with complete resection. Polyp tissue not retrieved. Internal hemorrhoids. Next  colonoscopy in 3 years for surveillance.  Restaging CTs 07/16/2015 with 2 new hypodense liver lesions concerning for metastases, no other evidence of disease progression  PET scan 07/28/2015 with a single hypermetabolic right liver lesion, no other evidence of metastatic disease  Ultrasound-guided biopsy of the right liver lesion 07/30/2015 confirmed metastatic colon cancer  MRI abdomen 08/20/2015 with similar to slight decrease in size of 2 hepatic metastasis. No new liver lesions or extrahepatic metastatic disease identified.  Radiofrequency ablation of 2 liver lesions 09/25/2015  MRI abdomen 02/02/2016-coagulative necrosis at the 2 ablated liver tumor sites,with no enhancement to suggest residual or recurrent liver tumor. No new liver masses or other findings of metastatic disease in the abdomen.  MRI abdomen 08/10/2016 revealed stable ablation sites, no evidence of progressive metastatic disease 2. Right ovary cystadenofibroma, status post a bilateral oophorectomy 10/22/2013 3. Remote history of cervical cancer. 4. Iron deficiency anemia-resolved. 5. Right calf deep vein thrombosis 10/24/2013-she completed 3 months of xarelto. 6. Indeterminate 12 mm hypermetabolic right pelvic density on the staging PET scan 10/04/2013-CT followup recommended per the GI tumor conference 11/13/2013. No longer visualized on a CT 07/14/2014. 7. Soft tissue implant overlying the posterior right liver on the CT 62/94/7654-YTK hypermetabolic on the PET scan 35/46/5681. 8. C. difficile colitis 03/05/2014. She completed a course of Flagyl, recurrent diarrhea beginning 03/21/2014, positive C. difficile toxin 03/26/2014-resumed Flagyl for planned 10 day course.  Vancomycin beginning 04/08/2014.  Taper complete 05/21/2014.  9. CVA May 2016 10. Skin infection at the right first MTP joint-Keflex prescribed 08/06/2015 11. Guillan-Barre syndrome February 2017 12. Left lower extremity DVTs March  2017-treated with Xarelto     Disposition:  She remains in clinical remission from colon cancer. We will follow-up on the CEA from today. She will return for an office visit, CEA, and restaging CT scans in 3 months.  15 minutes were spent with the patient today. The majority of the time was used for counseling and coordination of care.  [name], MD  11/08/2016  12:38 PM

## 2016-11-08 NOTE — Telephone Encounter (Signed)
Gave patient AVS and scheduled appts per 11/08/2016 - patient unable to come in 1-2 days after CT only on Tues or Wednesdays. [facility] to contact patient with CT scan schedule.

## 2016-11-16 ENCOUNTER — Telehealth: Payer: Self-pay | Admitting: *Deleted

## 2016-11-16 NOTE — Telephone Encounter (Signed)
-----   Message from Ladell Pier, MD sent at 11/15/2016  6:02 PM EDT ----- Please call patient, cea is slightly higher, but still in normal range, repeat with labs in June and f/u as scheduled

## 2016-11-16 NOTE — Telephone Encounter (Signed)
Patient notified per order of Dr. Benay Spice that cea is slightly higher, but still in normal range and that we will repeat cea with labs in June and to f/u as scheduled.  Patient appreciative of call and has no questions at this time.

## 2016-12-17 IMAGING — CR DG CHEST 1V PORT
1 series · 1 of 1 positions shown · non-contrast
Comparison: 10/18/2015.

CLINICAL DATA: Respiratory failure.

EXAM:
PORTABLE CHEST 1 VIEW

[AP]
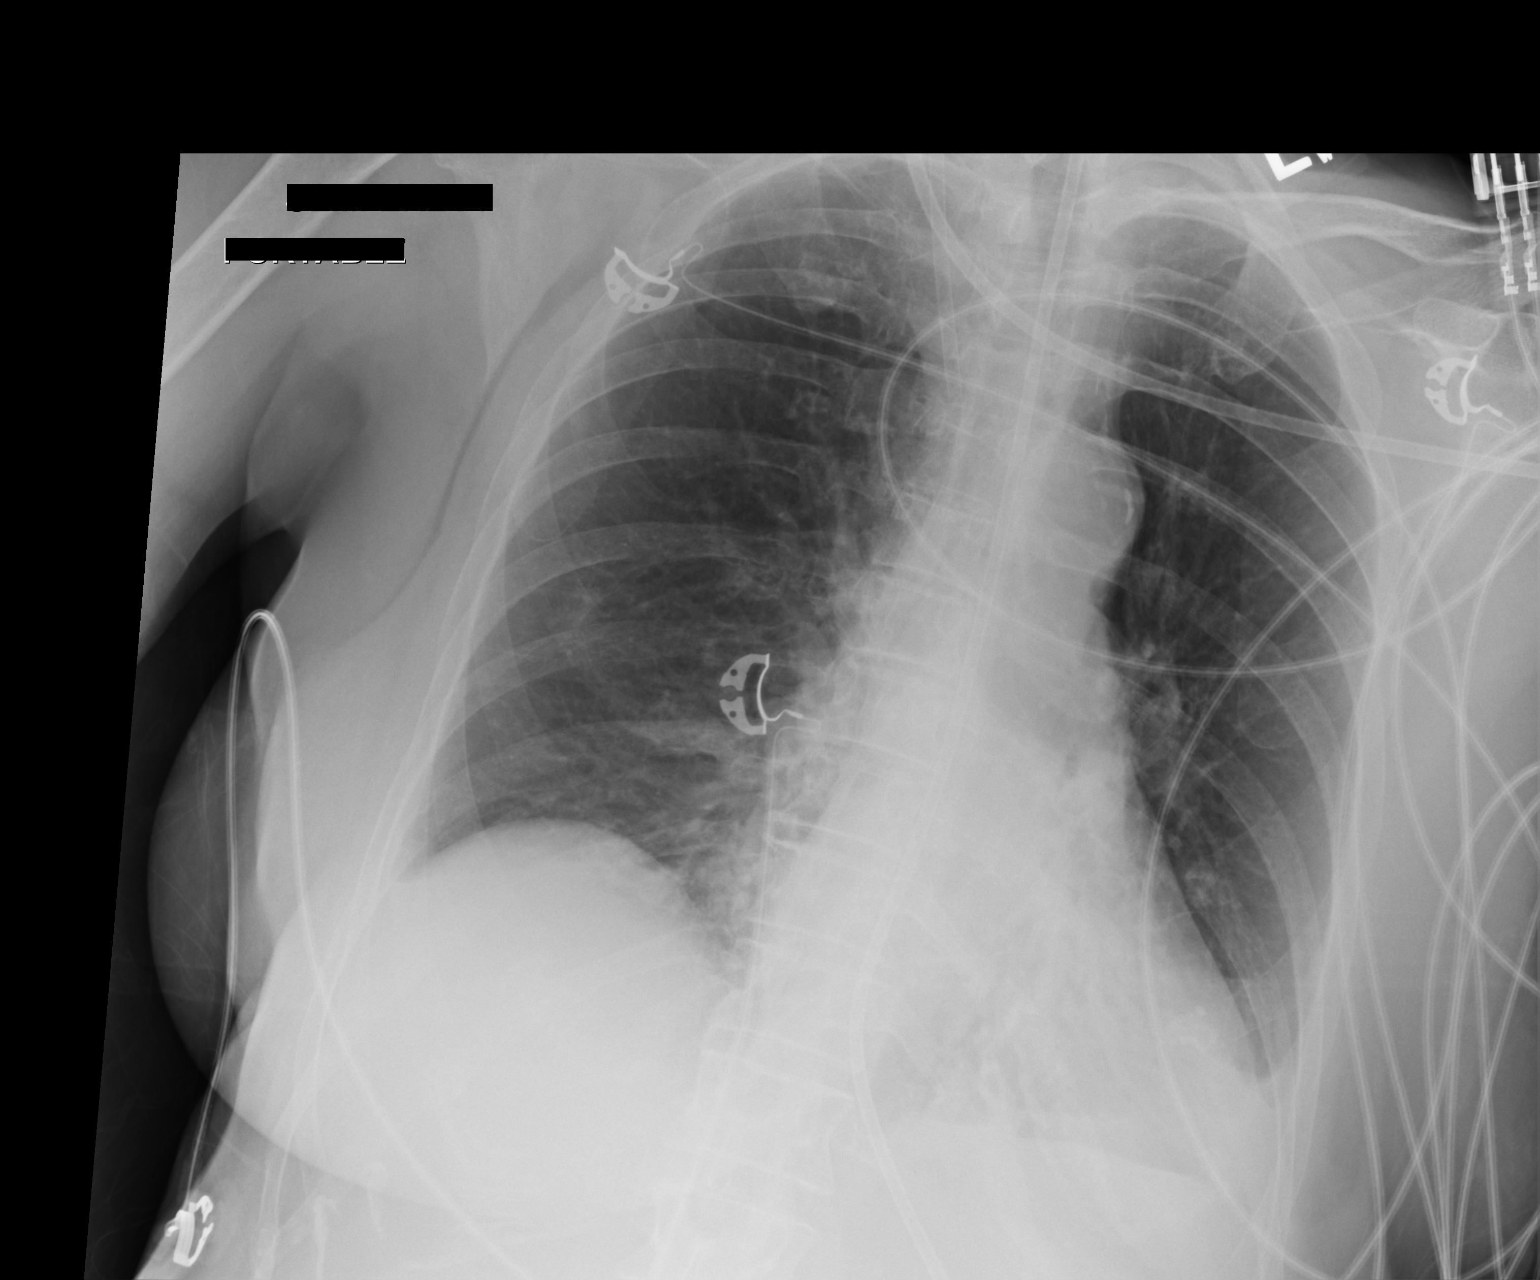

[1 of 1 positions shown; findings below may reference images not displayed]

FINDINGS: Interim extubation. Right IJ sheath and feeding tube in stable
position. Low lung volumes. Interim partial clearing of left base
atelectasis and/or infiltrate. Mild right base and right mid lung
field subsegmental atelectasis. Persistent small left pleural
effusion. Small left pleural effusion. No pneumothorax.
IMPRESSION: 1. Interim extubation. Right IJ sheath and feeding tube in stable
position.
2. Low lung volumes. Interim partial clearing of left base
atelectasis and/or infiltrate. Mild right base and right mid lung
field subsegmental atelectasis. Persistent small left pleural
effusion. Continued follow-up exam suggested to demonstrate clearing
to exclude underlying focal lesion.

## 2016-12-18 IMAGING — CR DG CHEST 1V PORT
1 series · 1 of 1 positions shown · non-contrast
Comparison: 10/19/2015.

CLINICAL DATA: Guillain-Barre syndrome.

EXAM:
PORTABLE CHEST 1 VIEW

[AP]
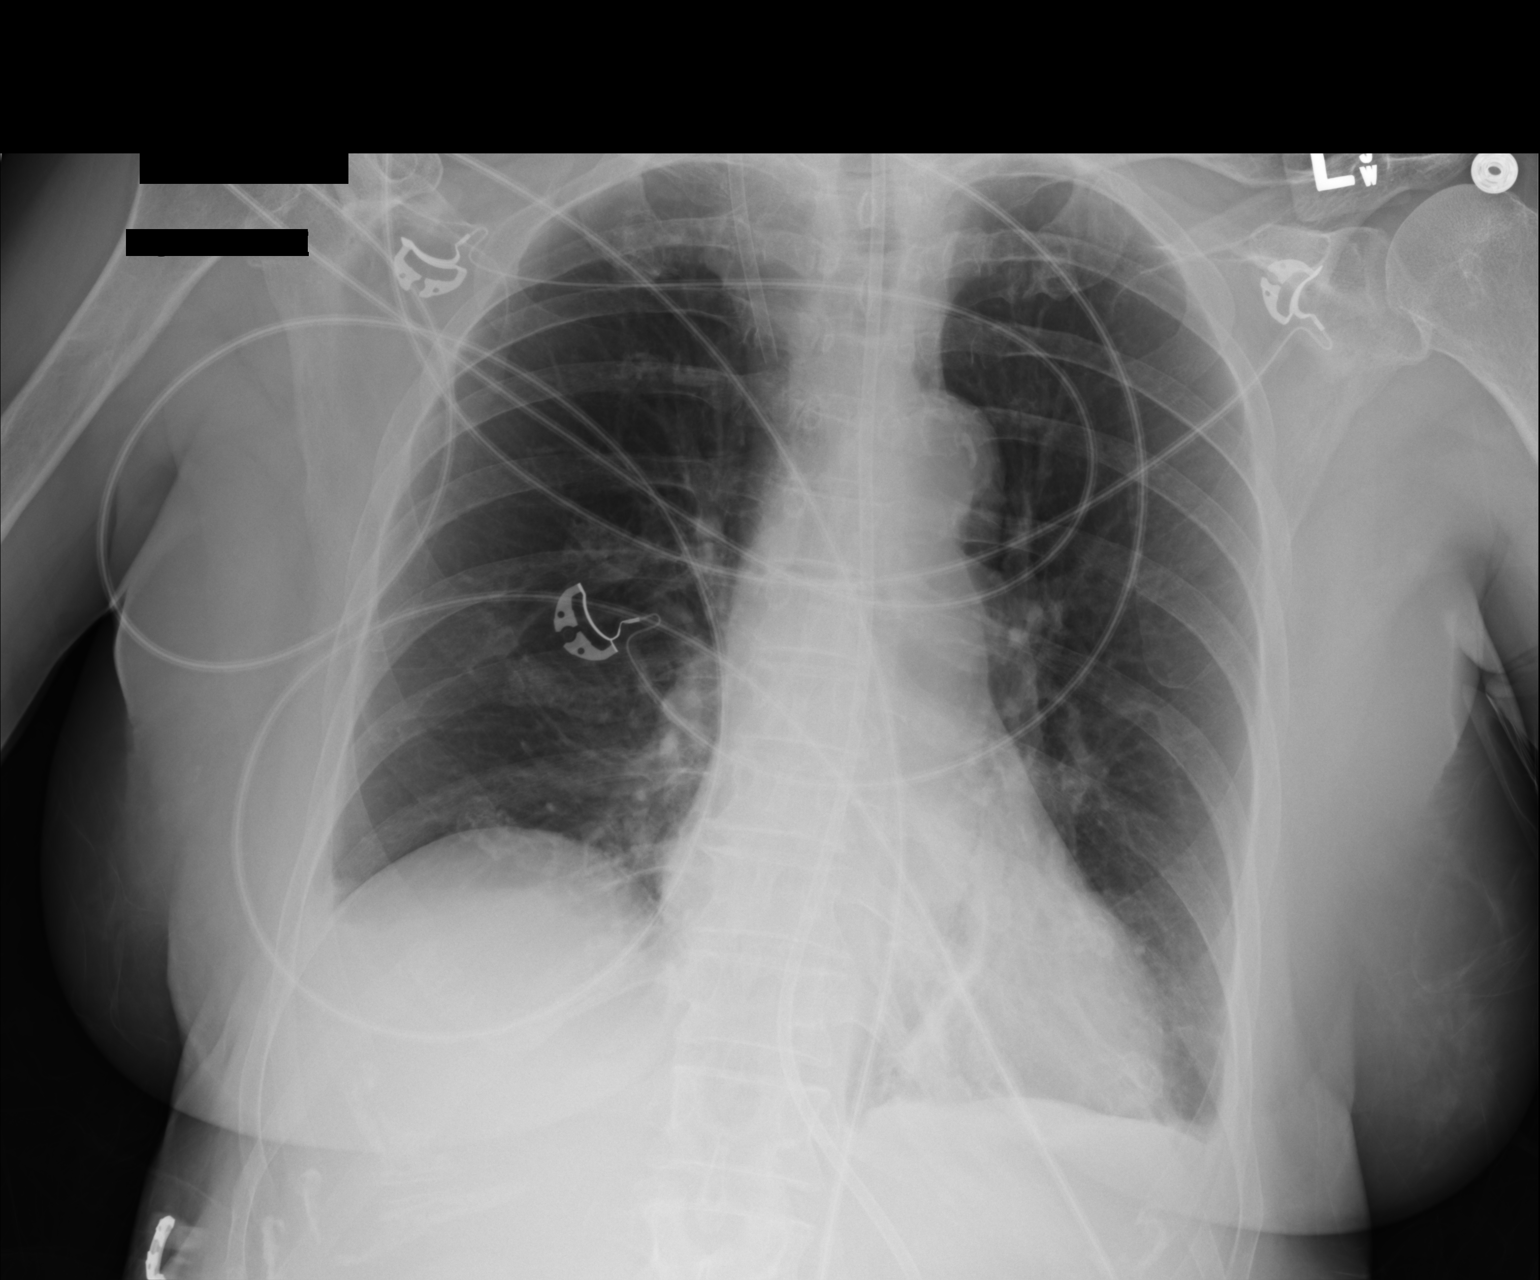

[1 of 1 positions shown; findings below may reference images not displayed]

FINDINGS: Right IJ sheath and feeding tube in stable position. Mediastinum and
hilar structures are stable. Interim improvement of bibasilar
atelectasis. Small bilateral pleural effusions. No pneumothorax.
IMPRESSION: 1. Lines and tubes in stable position.

2. Interim partial clearing of bibasilar atelectasis. Small
bilateral pleural effusions are again noted .

## 2016-12-18 IMAGING — DX DG ABD PORTABLE 1V
1 series · 1 of 1 positions shown · non-contrast
Comparison: October 14, 2015.

CLINICAL DATA: Encounter for feeding tube placement.

EXAM:
PORTABLE ABDOMEN - 1 VIEW

[abdomen supine]
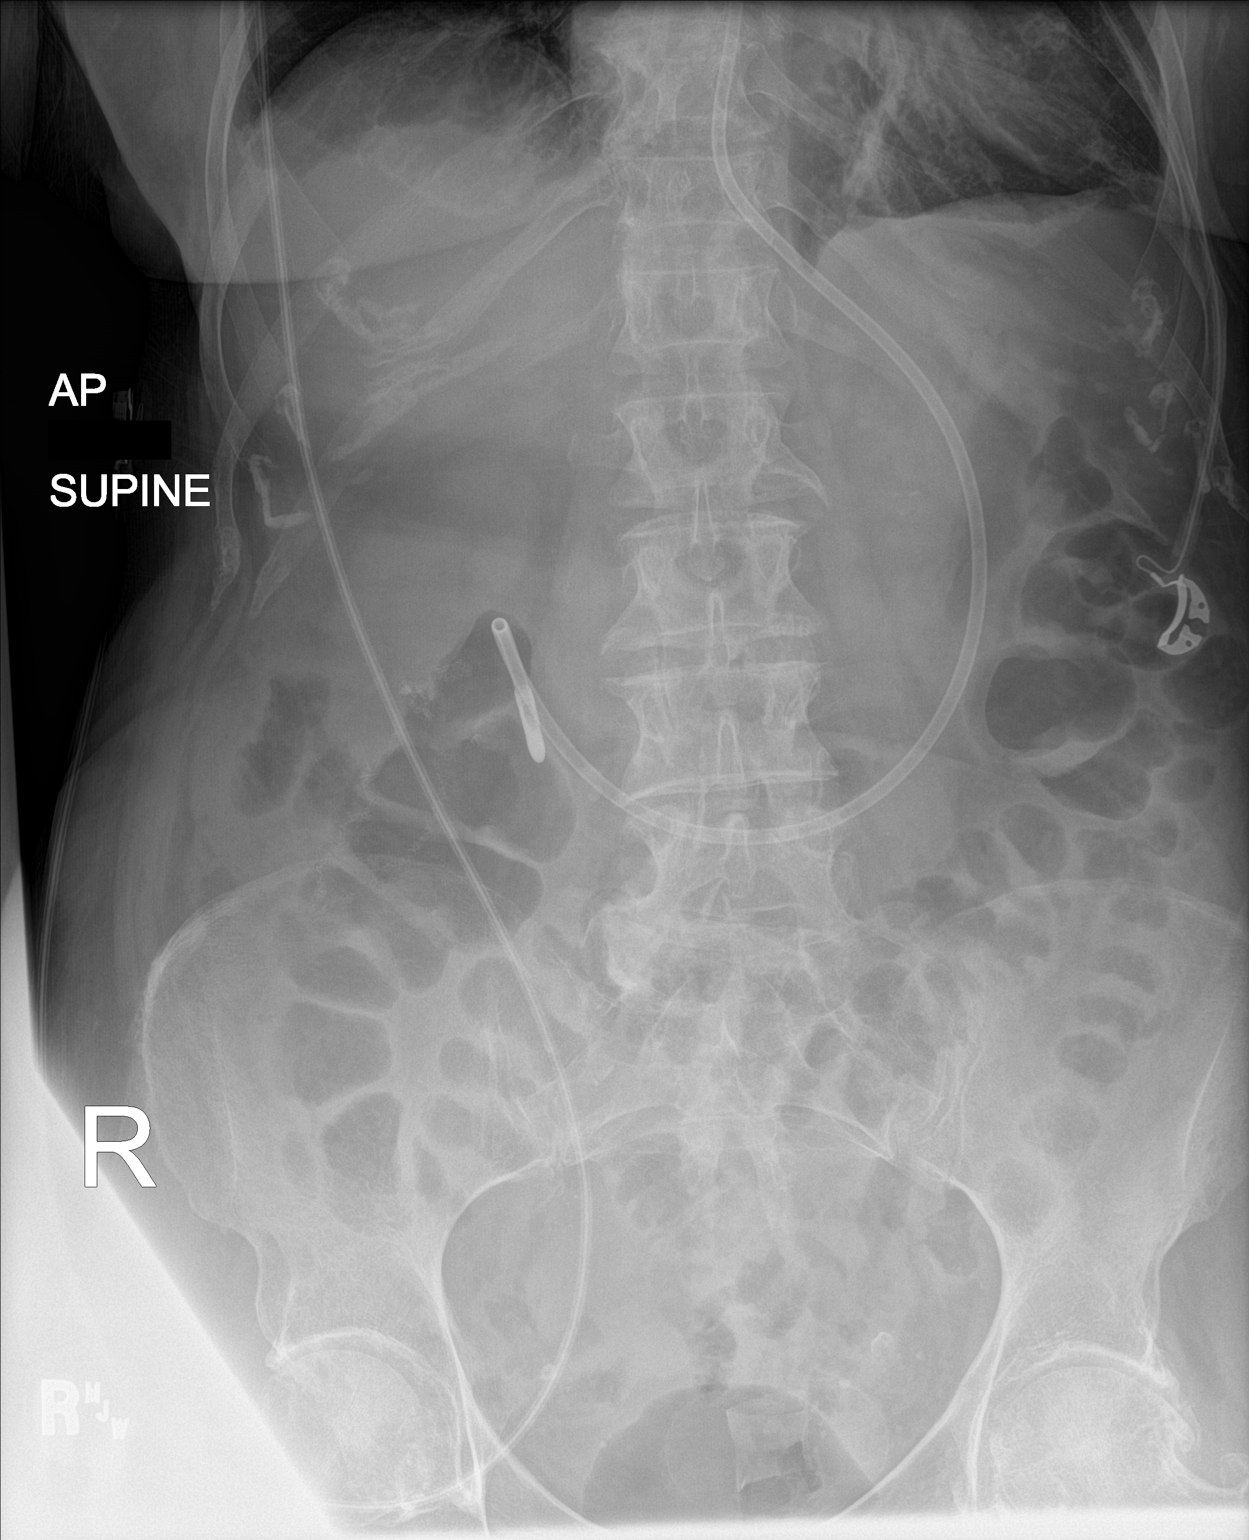

[1 of 1 positions shown; findings below may reference images not displayed]

FINDINGS: The bowel gas pattern is normal. Distal tip of feeding tube is seen
in expected position of distal stomach or first portion of duodenum.
IMPRESSION: Distal tip of feeding tube seen in expected position of distal
stomach or first portion of duodenum.

## 2016-12-19 ENCOUNTER — Other Ambulatory Visit: Payer: Self-pay | Admitting: Physical Medicine & Rehabilitation

## 2016-12-21 IMAGING — CT CT HEAD W/O CM
3 of 4 series · 17 of 30 positions shown, 19 images · non-contrast
Comparison: 10/06/2015

CLINICAL DATA: Altered mental status.

EXAM:
CT HEAD WITHOUT CONTRAST
TECHNIQUE: Contiguous axial images were obtained from the base of the skull
through the vertex without intravenous contrast.

[Series 201: head w/o, idose (1) · axial · non-contrast · 0.49mm/px · z∈[+308,+408]mm · 5 of 30 slices shown (1 of 2)]
[im 5/30  brain]
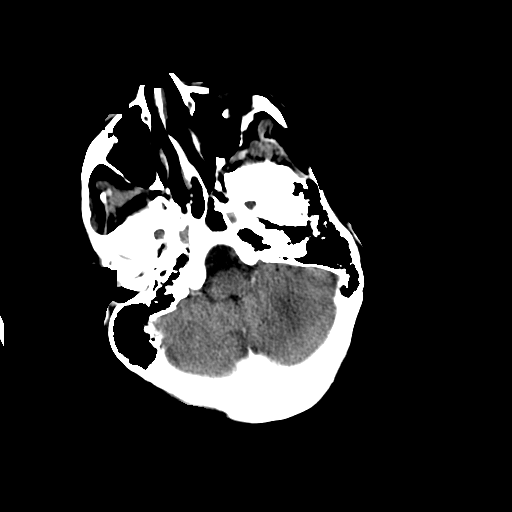
[im 10/30  brain]
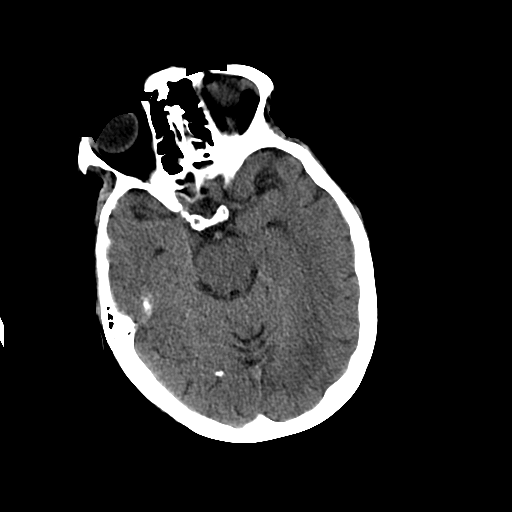
[im 15/30  brain]
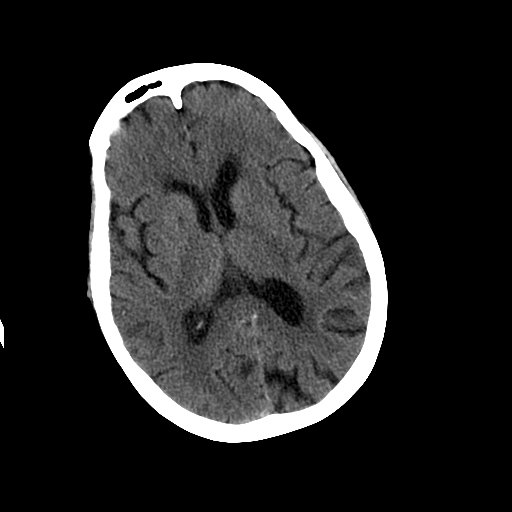
[im 20/30  brain]
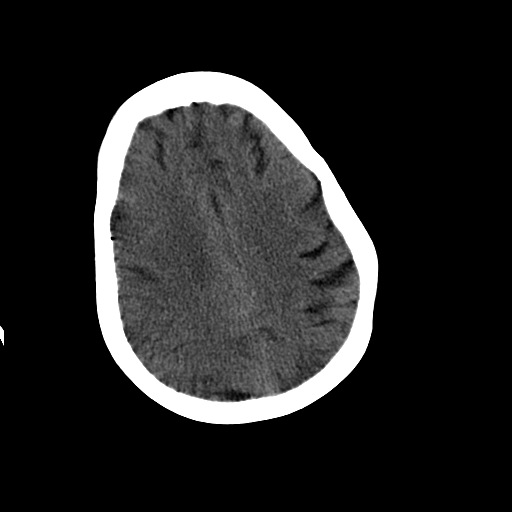
[im 25/30  brain]
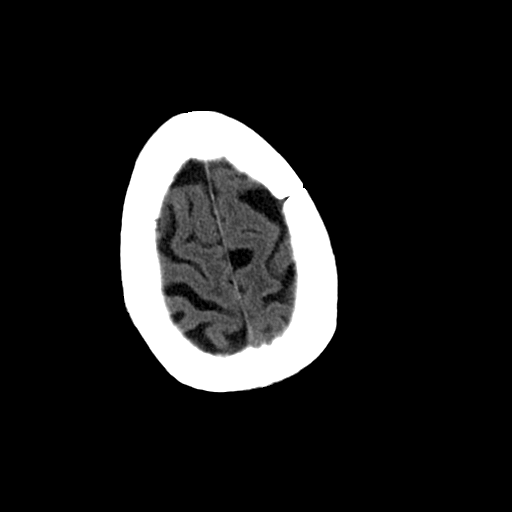

[Series 301: head w/o, idose (1) · axial · non-contrast · 0.49mm/px · z∈[+308,+408]mm · 6 of 30 slices shown, 8 images (2 of 2)]
[im 5/30  brain]
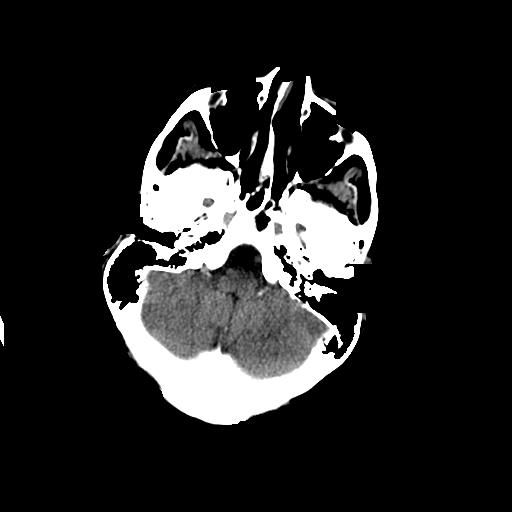
[im 5/30  bone]
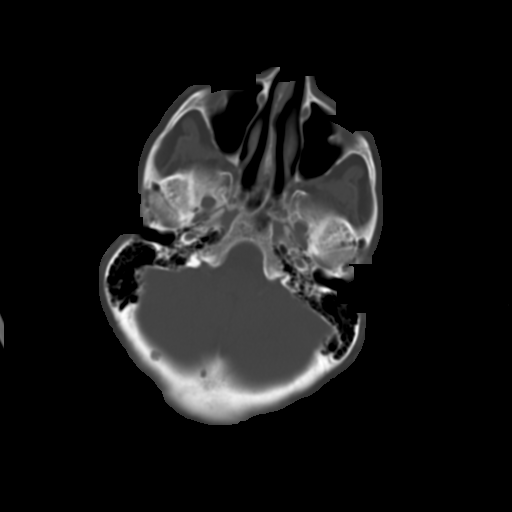
[im 9/30  brain]
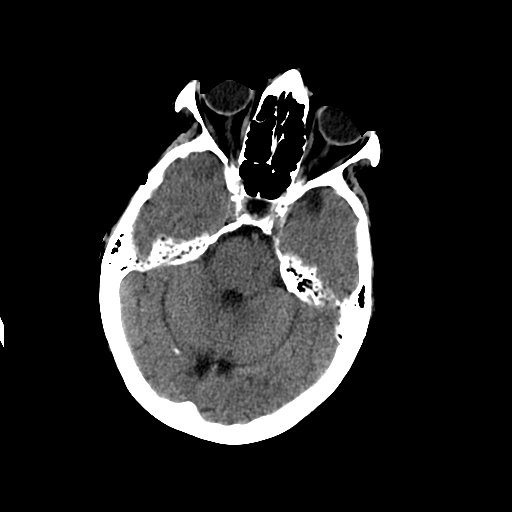
[im 13/30  brain]
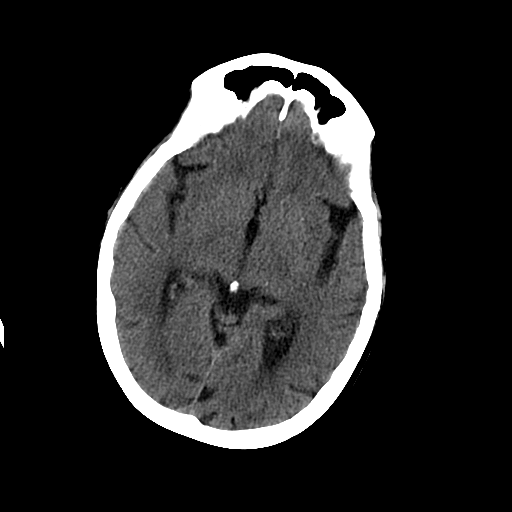
[im 17/30  brain]
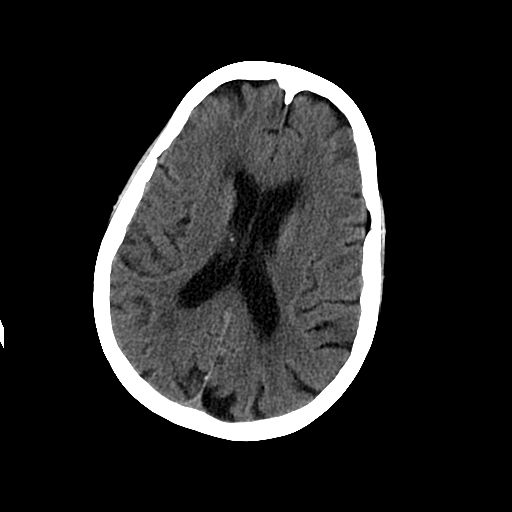
[im 21/30  brain]
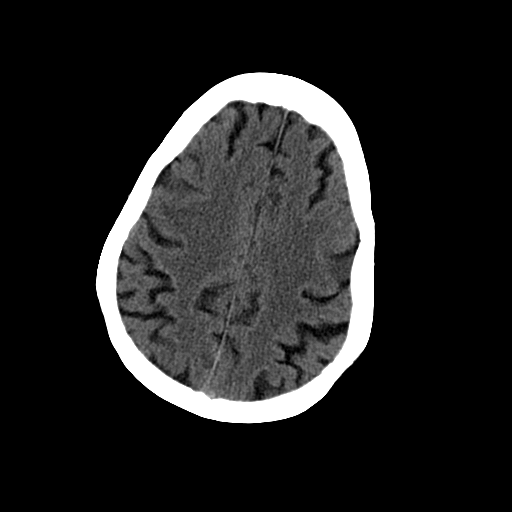
[im 21/30  bone]
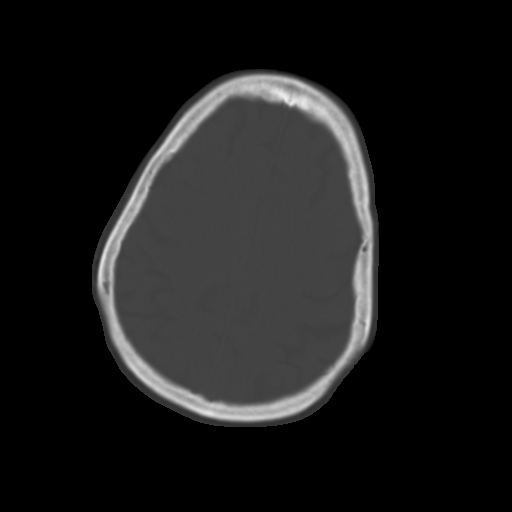
[im 25/30  brain]
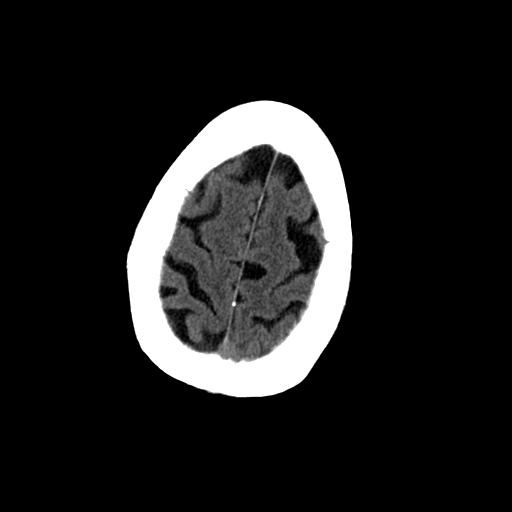

[Series 302: head w/o bone, idose (1) · axial · non-contrast · 0.49mm/px · z∈[+308,+408]mm · 6 of 30 slices shown]
[im 5/30  bone]
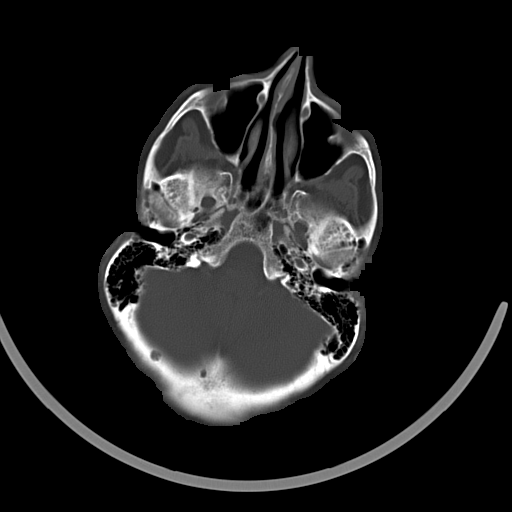
[im 9/30  bone]
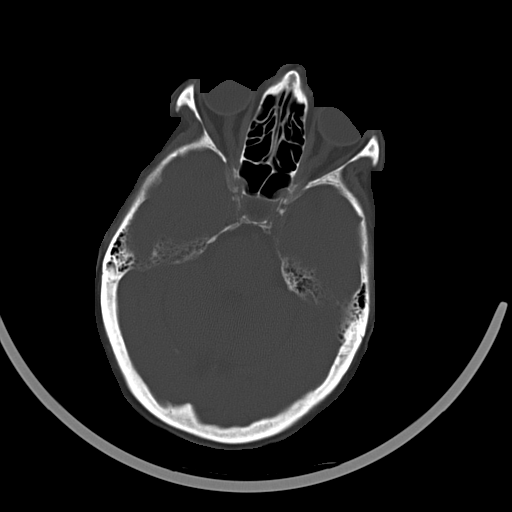
[im 13/30  bone]
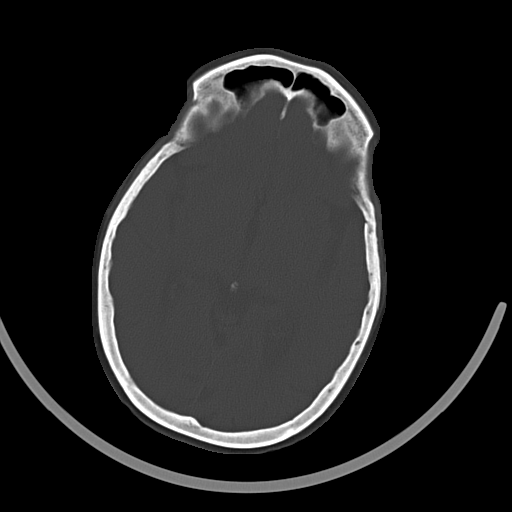
[im 17/30  bone]
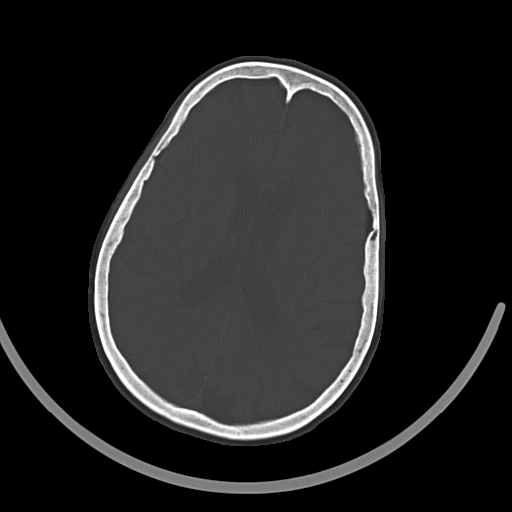
[im 21/30  bone]
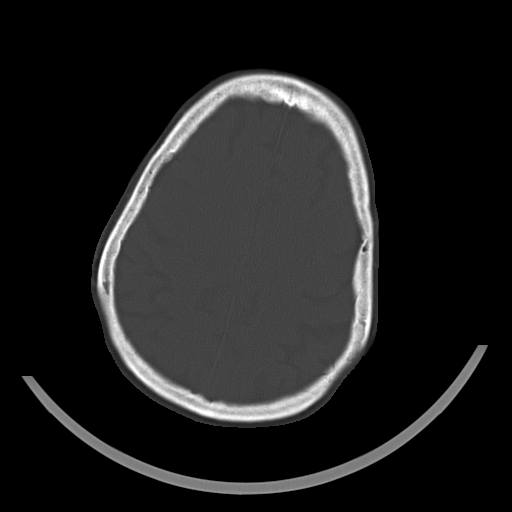
[im 25/30  bone]
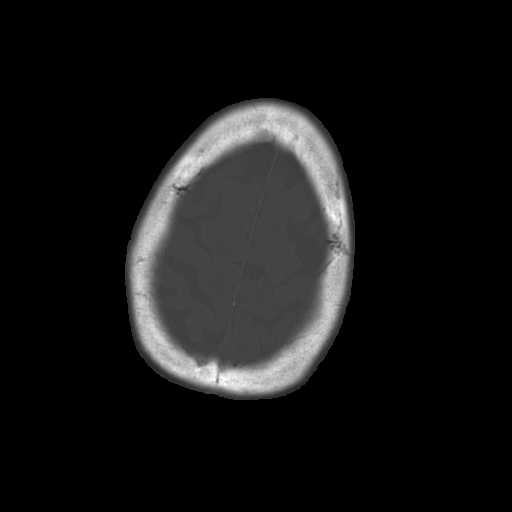

[17 of 30 positions shown; findings below may reference images not displayed]

FINDINGS: There is no evidence for acute hemorrhage, hydrocephalus, mass
lesion, or abnormal extra-axial fluid collection. No definite CT
evidence for acute infarction. Diffuse loss of parenchymal volume is
consistent with atrophy. Patchy low attenuation in the deep
hemispheric and periventricular white matter is nonspecific, but
likely reflects chronic microvascular ischemic demyelination. The
visualized paranasal sinuses and mastoid air cells are clear.
IMPRESSION: Stable exam without acute intracranial abnormality.

Atrophy with chronic small vessel white matter ischemic disease.

## 2017-02-08 ENCOUNTER — Other Ambulatory Visit (HOSPITAL_BASED_OUTPATIENT_CLINIC_OR_DEPARTMENT_OTHER): Payer: Medicare Other

## 2017-02-08 ENCOUNTER — Encounter (HOSPITAL_COMMUNITY): Payer: Self-pay

## 2017-02-08 ENCOUNTER — Ambulatory Visit (HOSPITAL_COMMUNITY)
Admission: RE | Admit: 2017-02-08 | Discharge: 2017-02-08 | Disposition: A | Discharge status: Home / Self Care | Payer: Medicare Other | Source: Ambulatory Visit | Attending: Oncology | Admitting: Oncology

## 2017-02-08 DIAGNOSIS — C189 Malignant neoplasm of colon, unspecified: Secondary | ICD-10-CM | POA: Diagnosis not present

## 2017-02-08 DIAGNOSIS — C787 Secondary malignant neoplasm of liver and intrahepatic bile duct: Secondary | ICD-10-CM | POA: Diagnosis not present

## 2017-02-08 DIAGNOSIS — K769 Liver disease, unspecified: Secondary | ICD-10-CM | POA: Insufficient documentation

## 2017-02-08 DIAGNOSIS — C180 Malignant neoplasm of cecum: Secondary | ICD-10-CM

## 2017-02-08 DIAGNOSIS — K7689 Other specified diseases of liver: Secondary | ICD-10-CM | POA: Diagnosis not present

## 2017-02-08 DIAGNOSIS — R590 Localized enlarged lymph nodes: Secondary | ICD-10-CM | POA: Insufficient documentation

## 2017-02-08 LAB — BASIC METABOLIC PANEL
Anion Gap: 10 mEq/L (ref 3–11)
BUN: 13.8 mg/dL (ref 7.0–26.0)
CALCIUM: 10.4 mg/dL (ref 8.4–10.4)
CHLORIDE: 107 meq/L (ref 98–109)
CO2: 27 mEq/L (ref 22–29)
Creatinine: 0.9 mg/dL (ref 0.6–1.1)
EGFR: 65 mL/min/{1.73_m2} — ABNORMAL LOW (ref >90)
Glucose: 82 mg/dl (ref 70–140)
Potassium: 5 mEq/L (ref 3.5–5.1)
SODIUM: 143 meq/L (ref 136–145)

## 2017-02-08 LAB — CEA (IN HOUSE-CHCC): CEA (CHCC-IN HOUSE): 8.83 ng/mL — AB (ref 0.00–5.00)

## 2017-02-08 MED ORDER — IOPAMIDOL (ISOVUE-300) INJECTION 61%
INTRAVENOUS | Status: AC
  Filled 2017-02-08: qty 100

## 2017-02-08 MED ORDER — IOPAMIDOL (ISOVUE-300) INJECTION 61%
100.0000 mL | Freq: Once | INTRAVENOUS | Status: AC | PRN
  Administered 2017-02-08: 100 mL via INTRAVENOUS

## 2017-02-08 MED ORDER — IOPAMIDOL (ISOVUE-300) INJECTION 61%: 30.0000 mL | Freq: Once | INTRAVENOUS | Status: DC | PRN

## 2017-02-14 ENCOUNTER — Telehealth: Payer: Self-pay | Admitting: Oncology

## 2017-02-14 ENCOUNTER — Ambulatory Visit (HOSPITAL_BASED_OUTPATIENT_CLINIC_OR_DEPARTMENT_OTHER): Payer: Medicare Other | Admitting: Oncology

## 2017-02-14 VITALS — BP 158/93 | HR 72 | Temp 98.4°F | Resp 18 | Ht 61.0 in | Wt 148.2 lb

## 2017-02-14 DIAGNOSIS — C189 Malignant neoplasm of colon, unspecified: Secondary | ICD-10-CM

## 2017-02-14 DIAGNOSIS — C787 Secondary malignant neoplasm of liver and intrahepatic bile duct: Secondary | ICD-10-CM

## 2017-02-14 DIAGNOSIS — C180 Malignant neoplasm of cecum: Secondary | ICD-10-CM

## 2017-02-14 NOTE — Progress Notes (Signed)
[place] OFFICE PROGRESS NOTE   Diagnosis: Colon cancer  INTERVAL HISTORY:   [name] returns as scheduled. She has stable neuropathy symptoms in the legs and feet. No new complaint. Good appetite.  Objective:  Vital signs in last 24 hours:  Blood pressure (!) 158/93, pulse 72, temperature 98.4 F (36.9 C), temperature source Oral, resp. rate 18, height 5' 1"  (1.549 m), weight 148 lb 3.2 oz (67.2 kg), SpO2 98 %.    HEENT:  neck without mass Lymphatics:  no cervical, supraclavicular, axillary, or inguinal nodes Resp:  lungs clear bilaterally Cardio:  regular rate and rhythm GI:  no hepatomegaly, no mass, nontender Vascular:  no leg edema   Lab Results:  Lab Results  Component Value Date   WBC 5.3 01/29/2016   HGB 11.7 01/29/2016   HCT 37.2 01/29/2016   MCV 88.8 01/29/2016   PLT 243 01/29/2016   NEUTROABS 3 12/07/2015    CMP     Component Value Date/Time   NA 143 02/08/2017 1029   K 5.0 02/08/2017 1029   CL 106 01/29/2016 0914   CO2 27 02/08/2017 1029   GLUCOSE 82 02/08/2017 1029   BUN 13.8 02/08/2017 1029   CREATININE 0.9 02/08/2017 1029   CALCIUM 10.4 02/08/2017 1029   PROT 7.0 08/10/2016 1227   ALBUMIN 3.9 08/10/2016 1227   AST 25 08/10/2016 1227   ALT 16 08/10/2016 1227   ALKPHOS 62 08/10/2016 1227   BILITOT 0.69 08/10/2016 1227   GFRNONAA 85 01/29/2016 0914   GFRAA >89 01/29/2016 0914    Lab Results  Component Value Date   CEA1 8.83 (H) 02/08/2017    Lab Results  Component Value Date   INR 1.1 01/29/2016    Imaging:  CT images 02/08/2017-reviewed  Medications: I have reviewed the patient's current medications.  Assessment/Plan: 1. Moderately differentiated adenocarcinoma of the cecum, stage III (T4a, N1), status post a laparoscopic assisted right colectomy 10/22/2013  The tumor returned microsatellite stable with normal mismatch repair protein expression   Cycle 1 adjuvant Xeloda 12/25/2013.   Cycle 2  adjuvant Xeloda 01/15/2014.   Cycle 3 adjuvant Xeloda 02/05/2014.   Cycle 4 adjuvant Xeloda 03/18/2014, discontinued after 4 days secondary to diarrhea   Cycle 5 adjuvant Xeloda 04/16/2014, Xeloda dose reduced to 1000 mg in the a.m. and 500 mg in the p.m.   Cycle 6 adjuvant Xeloda 05/07/2014.  Cycle 7 adjuvant Xeloda 05/28/2014.  Cycle 8 adjuvant Xeloda 06/18/2014.  Restaging CTs on 07/14/2014 with no evidence of metastatic colon cancer  Colonoscopy 10/02/2014 with diverticulosis in the sigmoid colon and in the descending colon. One 4 mm polyp in the sigmoid colon with complete resection. Polyp tissue not retrieved. Internal hemorrhoids. Next colonoscopy in 3 years for surveillance.  Restaging CTs 07/16/2015 with 2 new hypodense liver lesions concerning for metastases, no other evidence of disease progression  PET scan 07/28/2015 with a single hypermetabolic right liver lesion, no other evidence of metastatic disease  Ultrasound-guided biopsy of the right liver lesion 07/30/2015 confirmed metastatic colon cancer  MRI abdomen 08/20/2015 with similar to slight decrease in size of 2 hepatic metastasis. No new liver lesions or extrahepatic metastatic disease identified.  Radiofrequency ablation of 2 liver lesions 09/25/2015  MRI abdomen 02/02/2016-coagulative necrosis at the 2 ablated liver tumor sites,with no enhancement to suggest residual or recurrent liver tumor. No new liver masses or other findings of metastatic disease in the abdomen.  MRI abdomen 08/10/2016 revealed stable ablation sites, no evidence of progressive metastatic  disease  Restaging CT 02/08/2017-stable treated liver lesions, no evidence of progressive metastatic disease, stable borderline enlarged right inguinal node  2. Right ovary cystadenofibroma, status post a bilateral oophorectomy 10/22/2013 3. Remote history of cervical cancer. 4. Iron deficiency anemia-resolved. 5. Right calf deep vein thrombosis  10/24/2013-she completed 3 months of xarelto. 6. Indeterminate 12 mm hypermetabolic right pelvic density on the staging PET scan 10/04/2013-CT followup recommended per the GI tumor conference 11/13/2013. No longer visualized on a CT 07/14/2014. 7. Soft tissue implant overlying the posterior right liver on the CT 14/84/0397-XFF hypermetabolic on the PET scan 69/22/3009. 8. C. difficile colitis 03/05/2014. She completed a course of Flagyl, recurrent diarrhea beginning 03/21/2014, positive C. difficile toxin 03/26/2014-resumed Flagyl for planned 10 day course.  Vancomycin beginning 04/08/2014.  Taper complete 05/21/2014.  9. CVA May 2016 10. Skin infection at the right first MTP joint-Keflex prescribed 08/06/2015 11. Guillan-Barre syndrome February 2017 12. Left lower extremity DVTs March 2017-treated with Xarelto    Disposition:   [name] remains in clinical remission from colon cancer. The restaging CTs show no evidence for current disease, but the CEA is mildly elevated. She will return for a repeat CEA in 2-3 weeks. We will refer her for a surveillance colonoscopy and consider obtaining a PET scan if the CEA is consistently elevated.  She will return for an office visit in 2 months.  Donneta Romberg, MD  02/14/2017  1:05 PM

## 2017-02-14 NOTE — Telephone Encounter (Signed)
Scheduled appt per 7/3 los - Gave patient AVS and calender per los.  

## 2017-03-01 ENCOUNTER — Other Ambulatory Visit (HOSPITAL_BASED_OUTPATIENT_CLINIC_OR_DEPARTMENT_OTHER): Payer: Medicare Other

## 2017-03-01 DIAGNOSIS — C787 Secondary malignant neoplasm of liver and intrahepatic bile duct: Secondary | ICD-10-CM

## 2017-03-01 DIAGNOSIS — C180 Malignant neoplasm of cecum: Secondary | ICD-10-CM

## 2017-03-01 DIAGNOSIS — C189 Malignant neoplasm of colon, unspecified: Secondary | ICD-10-CM

## 2017-03-01 LAB — CEA (IN HOUSE-CHCC): CEA (CHCC-IN HOUSE): 8.56 ng/mL — AB (ref 0.00–5.00)

## 2017-03-08 ENCOUNTER — Telehealth: Payer: Self-pay | Admitting: *Deleted

## 2017-03-08 NOTE — Telephone Encounter (Signed)
  Telephone call to patient and advised lab results below. Patient scheduled for labs on Aug 15- she confirms appt.    Please call patient  cea is still mildly elevated-stable  Repeat 1 month, if still elevated will schedule PET

## 2017-03-21 ENCOUNTER — Other Ambulatory Visit: Payer: Self-pay | Admitting: Physical Medicine & Rehabilitation

## 2017-03-29 ENCOUNTER — Other Ambulatory Visit (HOSPITAL_BASED_OUTPATIENT_CLINIC_OR_DEPARTMENT_OTHER): Payer: Medicare Other

## 2017-03-29 DIAGNOSIS — C180 Malignant neoplasm of cecum: Secondary | ICD-10-CM | POA: Diagnosis not present

## 2017-03-29 DIAGNOSIS — C189 Malignant neoplasm of colon, unspecified: Secondary | ICD-10-CM

## 2017-03-29 DIAGNOSIS — C787 Secondary malignant neoplasm of liver and intrahepatic bile duct: Secondary | ICD-10-CM | POA: Diagnosis not present

## 2017-03-29 LAB — CEA (IN HOUSE-CHCC): CEA (CHCC-In House): 8.21 ng/mL — ABNORMAL HIGH (ref 0.00–5.00)

## 2017-04-03 ENCOUNTER — Telehealth: Payer: Self-pay | Admitting: *Deleted

## 2017-04-03 NOTE — Telephone Encounter (Signed)
-----   Message from Ladell Pier, MD sent at 03/31/2017  5:26 PM EDT ----- Please call patient, cea is stable, f/u as scheduled, repeat next visit

## 2017-04-19 ENCOUNTER — Ambulatory Visit (HOSPITAL_BASED_OUTPATIENT_CLINIC_OR_DEPARTMENT_OTHER): Payer: Medicare Other | Admitting: Nurse Practitioner

## 2017-04-19 ENCOUNTER — Other Ambulatory Visit: Payer: Self-pay | Admitting: *Deleted

## 2017-04-19 ENCOUNTER — Other Ambulatory Visit (HOSPITAL_BASED_OUTPATIENT_CLINIC_OR_DEPARTMENT_OTHER): Payer: Medicare Other

## 2017-04-19 ENCOUNTER — Telehealth: Payer: Self-pay | Admitting: Nurse Practitioner

## 2017-04-19 VITALS — BP 159/82 | Temp 97.8°F | Resp 18 | Ht 61.0 in | Wt 158.9 lb

## 2017-04-19 DIAGNOSIS — C787 Secondary malignant neoplasm of liver and intrahepatic bile duct: Secondary | ICD-10-CM | POA: Diagnosis not present

## 2017-04-19 DIAGNOSIS — C189 Malignant neoplasm of colon, unspecified: Secondary | ICD-10-CM

## 2017-04-19 DIAGNOSIS — C180 Malignant neoplasm of cecum: Secondary | ICD-10-CM

## 2017-04-19 DIAGNOSIS — Z85038 Personal history of other malignant neoplasm of large intestine: Secondary | ICD-10-CM

## 2017-04-19 DIAGNOSIS — R970 Elevated carcinoembryonic antigen [CEA]: Secondary | ICD-10-CM | POA: Diagnosis not present

## 2017-04-19 LAB — CEA (IN HOUSE-CHCC): CEA (CHCC-IN HOUSE): 8.4 ng/mL — AB (ref 0.00–5.00)

## 2017-04-19 NOTE — Progress Notes (Addendum)
Cynthia [name]   Diagnosis:  Colon cancer  INTERVAL HISTORY:   Cynthia [name] returns as scheduled. No change in bowel habits. No abdominal pain. No nausea. She describes her appetite is "fine". Main complaint today is tinnitus. This has been ongoing for some time.  Objective:  Vital signs in last 24 hours:  Blood pressure (!) 159/82, temperature 97.8 F (36.6 C), temperature source Oral, resp. rate 18, height 5' 1"  (1.549 m), weight 158 lb 14.4 oz (72.1 kg), SpO2 97 %.    HEENT: Neck without mass. Lymphatics: No palpable cervical, supraclavicular, axillary or inguinal lymph nodes. Resp: Lungs clear bilaterally. Cardio: Regular rate and rhythm. GI: Abdomen soft and nontender. No hepatomegaly. No mass. No apparent ascites. Vascular: No leg edema.   Lab Results:  Lab Results  Component Value Date   WBC 5.3 01/29/2016   HGB 11.7 01/29/2016   HCT 37.2 01/29/2016   MCV 88.8 01/29/2016   PLT 243 01/29/2016   NEUTROABS 3 12/07/2015    Imaging:  No results found.  Medications: I have reviewed the patient's current medications.  Assessment/Plan: 1. Moderately differentiated adenocarcinoma of the cecum, stage III (T4a, N1), status post a laparoscopic assisted right colectomy 10/22/2013  The tumor returned microsatellite stable with normal mismatch repair protein expression   Cycle 1 adjuvant Xeloda 12/25/2013.   Cycle 2 adjuvant Xeloda 01/15/2014.   Cycle 3 adjuvant Xeloda 02/05/2014.   Cycle 4 adjuvant Xeloda 03/18/2014, discontinued after 4 days secondary to diarrhea   Cycle 5 adjuvant Xeloda 04/16/2014, Xeloda dose reduced to 1000 mg in the a.m. and 500 mg in the p.m.   Cycle 6 adjuvant Xeloda 05/07/2014.  Cycle 7 adjuvant Xeloda 05/28/2014.  Cycle 8 adjuvant Xeloda 06/18/2014.  Restaging CTs on 07/14/2014 with no evidence of metastatic colon cancer  Colonoscopy 10/02/2014 with diverticulosis in the sigmoid colon and  in the descending colon. One 4 mm polyp in the sigmoid colon with complete resection. Polyp tissue not retrieved. Internal hemorrhoids. Next colonoscopy in 3 years for surveillance.  Restaging CTs 07/16/2015 with 2 new hypodense liver lesions concerning for metastases, no other evidence of disease progression  PET scan 07/28/2015 with a single hypermetabolic right liver lesion, no other evidence of metastatic disease  Ultrasound-guided biopsy of the right liver lesion 07/30/2015 confirmed metastatic colon cancer  MRI abdomen 08/20/2015 with similar to slight decrease in size of 2 hepatic metastasis. No new liver lesions or extrahepatic metastatic disease identified.  Radiofrequency ablation of 2 liver lesions 09/25/2015  MRI abdomen 02/02/2016-coagulative necrosis at the 2 ablated liver tumor sites,with no enhancement to suggest residual or recurrent liver tumor. No new liver masses or other findings of metastatic disease in the abdomen.  MRI abdomen 08/10/2016 revealed stable ablation sites, no evidence of progressive metastatic disease  Restaging CT 02/08/2017-stable treated liver lesions, no evidence of progressive metastatic disease, stable borderline enlarged right inguinal node   CEA with stable mild elevation 02/08/2017, 03/01/2017, 03/29/2017 2. Right ovary cystadenofibroma, status post a bilateral oophorectomy 10/22/2013 3. Remote history of cervical cancer. 4. Iron deficiency anemia-resolved. 5. Right calf deep vein thrombosis 10/24/2013-she completed 3 months of xarelto. 6. Indeterminate 12 mm hypermetabolic right pelvic density on the staging PET scan 10/04/2013-CT followup recommended per the GI tumor conference 11/13/2013. No longer visualized on a CT 07/14/2014. 7. Soft tissue implant overlying the posterior right liver on the CT 19/41/7408-XKG hypermetabolic on the PET scan 81/85/6314. 8. C. difficile colitis 03/05/2014. She completed a course of Flagyl,  recurrent diarrhea  beginning 03/21/2014, positive C. difficile toxin 03/26/2014-resumed Flagyl for planned 10 day course.  Vancomycin beginning 04/08/2014.  Taper complete 05/21/2014.  9. CVA May 2016 10. Skin infection at the right first MTP joint-Keflex prescribed 08/06/2015 11. Guillan-Barre syndrome February 2017 12. Left lower extremity DVTs March 2017-treated with Xarelto   Disposition: Cynthia [name] remains in clinical remission from colon cancer. However, the CEA shows stable mild elevation over several months. We will follow-up on the value from today. We discussed a referral for a surveillance colonoscopy. She declines this at present due to concern for recurrence of Cynthia [name] following a medical procedure. The plan is for a PET scan if the CEA continues to rise. We scheduled a return visit and CEA in 3 months. She will contact the office in the interim with any problems.  Patient seen with Dr. Benay Spice. 25 minutes were spent face-to-face at today's visit with the majority of that time involved in counseling/coordination of care.    Ned Card ANP/GNP-BC   04/19/2017  10:56 AM   This was a shared visit with Ned Card. Cynthia [name] has a persistently elevated CEA. We will refer her for a restaging PET if the CEA rises.  Julieanne Manson, M.D.

## 2017-04-19 NOTE — Telephone Encounter (Signed)
Scheduled appt per 9/5 los - lab and f/u in 3 months - Gave patient AVS and calender per los.

## 2017-06-17 ENCOUNTER — Other Ambulatory Visit: Payer: Self-pay | Admitting: Physical Medicine & Rehabilitation

## 2017-06-29 ENCOUNTER — Other Ambulatory Visit: Payer: Self-pay | Admitting: Physical Medicine & Rehabilitation

## 2017-06-29 NOTE — Telephone Encounter (Signed)
Recieved electronic medication refill request for gabapentin, patient last seen in clinic 10-31-16 and placed on a F/U as needed basis, unsure if ok to refill this medication, please advise

## 2017-07-05 ENCOUNTER — Other Ambulatory Visit: Payer: Self-pay | Admitting: *Deleted

## 2017-07-05 ENCOUNTER — Other Ambulatory Visit: Payer: Self-pay

## 2017-07-05 MED ORDER — GABAPENTIN 400 MG PO CAPS: 400.0000 mg | ORAL_CAPSULE | Freq: Three times a day (TID) | ORAL | 3 refills | Status: DC

## 2017-07-05 NOTE — Telephone Encounter (Signed)
Recieved electronic medication refill request for gabapentin on 06-29-2017, forwarded to you for review, patient last seen in clinic 10-31-16 and placed on a F/U as needed basis, unsure if ok to refill this medication, please advise  Patient called office today requesting status of refill, please advise

## 2017-07-18 ENCOUNTER — Telehealth: Payer: Self-pay | Admitting: Oncology

## 2017-07-18 ENCOUNTER — Encounter: Payer: Self-pay | Admitting: Oncology

## 2017-07-18 ENCOUNTER — Ambulatory Visit: Payer: Medicare Other | Admitting: Oncology

## 2017-07-18 ENCOUNTER — Other Ambulatory Visit (HOSPITAL_BASED_OUTPATIENT_CLINIC_OR_DEPARTMENT_OTHER): Payer: Medicare Other

## 2017-07-18 ENCOUNTER — Other Ambulatory Visit: Payer: Self-pay

## 2017-07-18 VITALS — BP 145/82 | HR 95 | Temp 98.1°F | Resp 20 | Wt 147.8 lb

## 2017-07-18 DIAGNOSIS — C787 Secondary malignant neoplasm of liver and intrahepatic bile duct: Secondary | ICD-10-CM

## 2017-07-18 DIAGNOSIS — I82409 Acute embolism and thrombosis of unspecified deep veins of unspecified lower extremity: Secondary | ICD-10-CM | POA: Diagnosis not present

## 2017-07-18 DIAGNOSIS — C180 Malignant neoplasm of cecum: Secondary | ICD-10-CM

## 2017-07-18 DIAGNOSIS — C189 Malignant neoplasm of colon, unspecified: Secondary | ICD-10-CM

## 2017-07-18 LAB — CEA (IN HOUSE-CHCC): CEA (CHCC-In House): 12.75 ng/mL — ABNORMAL HIGH (ref 0.00–5.00)

## 2017-07-18 NOTE — Progress Notes (Signed)
Patient here for follow up, no changes since last appt.

## 2017-07-18 NOTE — Telephone Encounter (Signed)
Gave avs and  Calendar for March 2019

## 2017-07-18 NOTE — Progress Notes (Signed)
[facility] OFFICE PROGRESS NOTE   Diagnosis: Colon cancer  INTERVAL HISTORY:   Ms. Gruver returns as scheduled.  She feels well.  Good appetite and energy level.  Her leg strength has improved.  No complaint.  Objective:  Vital signs in last 24 hours:  Blood pressure (!) 145/82, pulse 95, temperature 98.1 F (36.7 C), temperature source Oral, resp. rate 20, weight 147 lb 12.8 oz (67 kg), SpO2 93 %.    HEENT: Neck without mass Lymphatics: No cervical, supraclavicular, axillary, or inguinal nodes Resp: Lungs clear bilaterally Cardio: Regular rate and rhythm GI: No hepatomegaly, no mass, nontender Vascular: The right lower leg is larger than the left side, no edema    Lab Results  Component Value Date   CEA1 8.40 (H) 04/19/2017     Medications: I have reviewed the patient's current medications.  Assessment/Plan: 1. Moderately differentiated adenocarcinoma of the cecum, stage III (T4a, N1), status post a laparoscopic assisted right colectomy 10/22/2013  The tumor returned microsatellite stable with normal mismatch repair protein expression   Cycle 1 adjuvant Xeloda 12/25/2013.   Cycle 2 adjuvant Xeloda 01/15/2014.   Cycle 3 adjuvant Xeloda 02/05/2014.   Cycle 4 adjuvant Xeloda 03/18/2014, discontinued after 4 days secondary to diarrhea   Cycle 5 adjuvant Xeloda 04/16/2014, Xeloda dose reduced to 1000 mg in the a.m. and 500 mg in the p.m.   Cycle 6 adjuvant Xeloda 05/07/2014.  Cycle 7 adjuvant Xeloda 05/28/2014.  Cycle 8 adjuvant Xeloda 06/18/2014.  Restaging CTs on 07/14/2014 with no evidence of metastatic colon cancer  Colonoscopy 10/02/2014 with diverticulosis in the sigmoid colon and in the descending colon. One 4 mm polyp in the sigmoid colon with complete resection. Polyp tissue not retrieved. Internal hemorrhoids. Next colonoscopy in 3 years for surveillance.  Restaging CTs 07/16/2015 with 2 new hypodense liver lesions concerning  for metastases, no other evidence of disease progression  PET scan 07/28/2015 with a single hypermetabolic right liver lesion, no other evidence of metastatic disease  Ultrasound-guided biopsy of the right liver lesion 07/30/2015 confirmed metastatic colon cancer  MRI abdomen 08/20/2015 with similar to slight decrease in size of 2 hepatic metastasis. No new liver lesions or extrahepatic metastatic disease identified.  Radiofrequency ablation of 2 liver lesions 09/25/2015  MRI abdomen 02/02/2016-coagulative necrosis at the 2 ablated liver tumor sites,with no enhancement to suggest residual or recurrent liver tumor. No new liver masses or other findings of metastatic disease in the abdomen.  MRI abdomen 08/10/2016 revealed stable ablation sites, no evidence of progressive metastatic disease  Restaging CT 02/08/2017-stable treated liver lesions, no evidence of progressive metastatic disease, stable borderline enlarged right inguinal node   CEA with stable mild elevation 02/08/2017, 03/01/2017, 03/29/2017 04/19/2017 2. Right ovary cystadenofibroma, status post a bilateral oophorectomy 10/22/2013 3. Remote history of cervical cancer. 4. Iron deficiency anemia-resolved. 5. Right calf deep vein thrombosis 10/24/2013-she completed 3 months of xarelto. 6. Indeterminate 12 mm hypermetabolic right pelvic density on the staging PET scan 10/04/2013-CT followup recommended per the GI tumor conference 11/13/2013. No longer visualized on a CT 07/14/2014. 7. Soft tissue implant overlying the posterior right liver on the CT 34/28/7681-LXB hypermetabolic on the PET scan 26/20/3559. 8. C. difficile colitis 03/05/2014. She completed a course of Flagyl, recurrent diarrhea beginning 03/21/2014, positive C. difficile toxin 03/26/2014-resumed Flagyl for planned 10 day course.  Vancomycin beginning 04/08/2014.  Taper complete 05/21/2014.  9. CVA May 2016 10. Skin infection at the right first MTP joint-Keflex  prescribed 08/06/2015 11. Guillan-Barre  syndrome February 2017 12. Left lower extremity DVTs March 2017-treated with Xarelto    Disposition:  She appears unchanged.  No clinical evidence of progressive colon cancer.  We will follow-up on the CEA from today. Ms. Tatem will return for an office visit and restaging CTs in 3 months. 15 minutes were spent with the patient today.  The majority of the time was used for counseling and coordination of care.  [name], MD  07/18/2017  11:02 AM

## 2017-07-19 ENCOUNTER — Telehealth: Payer: Self-pay | Admitting: *Deleted

## 2017-07-19 DIAGNOSIS — C189 Malignant neoplasm of colon, unspecified: Secondary | ICD-10-CM

## 2017-07-19 DIAGNOSIS — C787 Secondary malignant neoplasm of liver and intrahepatic bile duct: Principal | ICD-10-CM

## 2017-07-19 NOTE — Telephone Encounter (Signed)
Patient returned call informed of need repeat CEA in one month. Patient verbalized understanding.

## 2017-07-19 NOTE — Telephone Encounter (Addendum)
   Called patient left message to call back for CEA results and plan for 1 month.  ----- Message from Ladell Pier, MD sent at 07/19/2017 12:09 PM EST ----- Please call patient, CEA is mildly elevated, repeat 1 month and if higher will schedule CTs

## 2017-07-19 NOTE — Addendum Note (Signed)
Addended by: Brien Few on: 07/19/2017 02:12 PM   Modules accepted: Orders

## 2017-07-25 ENCOUNTER — Telehealth: Payer: Self-pay | Admitting: Oncology

## 2017-07-25 NOTE — Telephone Encounter (Signed)
Per scheduling msg 12/5 .... Left message on voicemail regarding appt and schedule mailed

## 2017-08-21 ENCOUNTER — Inpatient Hospital Stay: Payer: Medicare Other | Attending: Oncology

## 2017-08-21 DIAGNOSIS — C180 Malignant neoplasm of cecum: Secondary | ICD-10-CM | POA: Insufficient documentation

## 2017-08-21 DIAGNOSIS — R970 Elevated carcinoembryonic antigen [CEA]: Secondary | ICD-10-CM | POA: Insufficient documentation

## 2017-08-21 DIAGNOSIS — I82409 Acute embolism and thrombosis of unspecified deep veins of unspecified lower extremity: Secondary | ICD-10-CM | POA: Insufficient documentation

## 2017-08-21 DIAGNOSIS — C189 Malignant neoplasm of colon, unspecified: Secondary | ICD-10-CM

## 2017-08-21 DIAGNOSIS — C787 Secondary malignant neoplasm of liver and intrahepatic bile duct: Secondary | ICD-10-CM | POA: Insufficient documentation

## 2017-08-21 LAB — CEA (IN HOUSE-CHCC): CEA (CHCC-In House): 17.91 ng/mL — ABNORMAL HIGH (ref 0.00–5.00)

## 2017-08-22 ENCOUNTER — Telehealth: Payer: Self-pay | Admitting: Emergency Medicine

## 2017-08-22 DIAGNOSIS — C189 Malignant neoplasm of colon, unspecified: Secondary | ICD-10-CM

## 2017-08-22 DIAGNOSIS — C787 Secondary malignant neoplasm of liver and intrahepatic bile duct: Principal | ICD-10-CM

## 2017-08-22 NOTE — Telephone Encounter (Addendum)
CT scheduled for 1/22 at 9:30 am. Pt has prep and voiced understanding of instructions for prep kit. Pt knows to be NPO 4 hours prior. Lab appt for CMET at 8:30 on 1/22. Pt is to see Lattie Haw, NP on 1/23 at 1:45 to discuss CT results. Lab orders and CT orders are in epic.   ----- Message from Ladell Pier, MD sent at 08/21/2017  5:53 PM EST ----- Please call patient, CEA is higher again  Schedule CTs chest, abdomen, and pelvis with contrast next 2-3 weeks, office visit 1-2 days after the CTs with Benay Spice or Lattie Haw Will need creatinine checked for CT

## 2017-08-24 ENCOUNTER — Telehealth: Payer: Self-pay | Admitting: Nurse Practitioner

## 2017-08-24 NOTE — Telephone Encounter (Signed)
Patient scheduled per 1/8 sch message. Patient aware of date and time.

## 2017-09-05 ENCOUNTER — Inpatient Hospital Stay: Payer: Medicare Other

## 2017-09-05 ENCOUNTER — Ambulatory Visit (HOSPITAL_COMMUNITY)
Admission: RE | Admit: 2017-09-05 | Discharge: 2017-09-05 | Disposition: A | Discharge status: Home / Self Care | Payer: Medicare Other | Source: Ambulatory Visit | Attending: Oncology | Admitting: Oncology

## 2017-09-05 DIAGNOSIS — C787 Secondary malignant neoplasm of liver and intrahepatic bile duct: Principal | ICD-10-CM

## 2017-09-05 DIAGNOSIS — C189 Malignant neoplasm of colon, unspecified: Secondary | ICD-10-CM

## 2017-09-05 DIAGNOSIS — I700 Atherosclerosis of aorta: Secondary | ICD-10-CM | POA: Diagnosis not present

## 2017-09-05 DIAGNOSIS — M12851 Other specific arthropathies, not elsewhere classified, right hip: Secondary | ICD-10-CM | POA: Diagnosis not present

## 2017-09-05 DIAGNOSIS — R918 Other nonspecific abnormal finding of lung field: Secondary | ICD-10-CM | POA: Insufficient documentation

## 2017-09-05 DIAGNOSIS — M12852 Other specific arthropathies, not elsewhere classified, left hip: Secondary | ICD-10-CM | POA: Diagnosis not present

## 2017-09-05 DIAGNOSIS — K5730 Diverticulosis of large intestine without perforation or abscess without bleeding: Secondary | ICD-10-CM | POA: Diagnosis not present

## 2017-09-05 DIAGNOSIS — I77810 Thoracic aortic ectasia: Secondary | ICD-10-CM | POA: Diagnosis not present

## 2017-09-05 DIAGNOSIS — I253 Aneurysm of heart: Secondary | ICD-10-CM | POA: Diagnosis not present

## 2017-09-05 DIAGNOSIS — I82409 Acute embolism and thrombosis of unspecified deep veins of unspecified lower extremity: Secondary | ICD-10-CM | POA: Diagnosis not present

## 2017-09-05 DIAGNOSIS — I2510 Atherosclerotic heart disease of native coronary artery without angina pectoris: Secondary | ICD-10-CM | POA: Diagnosis not present

## 2017-09-05 DIAGNOSIS — R970 Elevated carcinoembryonic antigen [CEA]: Secondary | ICD-10-CM | POA: Diagnosis not present

## 2017-09-05 DIAGNOSIS — C180 Malignant neoplasm of cecum: Secondary | ICD-10-CM | POA: Diagnosis present

## 2017-09-05 LAB — COMPREHENSIVE METABOLIC PANEL
ALBUMIN: 3.9 g/dL (ref 3.5–5.0)
ALT: 19 U/L (ref 0–55)
AST: 27 U/L (ref 5–34)
Alkaline Phosphatase: 56 U/L (ref 40–150)
Anion gap: 10 (ref 3–11)
BUN: 13 mg/dL (ref 7–26)
CHLORIDE: 104 mmol/L (ref 98–109)
CO2: 28 mmol/L (ref 22–29)
CREATININE: 0.94 mg/dL (ref 0.60–1.10)
Calcium: 9.6 mg/dL (ref 8.4–10.4)
GFR calc Af Amer: >60 mL/min (ref >60)
GFR, EST NON AFRICAN AMERICAN: 56 mL/min — AB (ref >60)
Glucose, Bld: 78 mg/dL (ref 70–140)
POTASSIUM: 4.3 mmol/L (ref 3.3–4.7)
Sodium: 142 mmol/L (ref 136–145)
Total Bilirubin: 0.5 mg/dL (ref 0.2–1.2)
Total Protein: 7.3 g/dL (ref 6.4–8.3)

## 2017-09-05 MED ORDER — IOPAMIDOL (ISOVUE-300) INJECTION 61%
INTRAVENOUS | Status: AC
  Filled 2017-09-05: qty 100

## 2017-09-05 MED ORDER — IOPAMIDOL (ISOVUE-300) INJECTION 61%
100.0000 mL | Freq: Once | INTRAVENOUS | Status: AC | PRN
  Administered 2017-09-05: 100 mL via INTRAVENOUS

## 2017-09-06 ENCOUNTER — Inpatient Hospital Stay (HOSPITAL_BASED_OUTPATIENT_CLINIC_OR_DEPARTMENT_OTHER): Payer: Medicare Other | Admitting: Nurse Practitioner

## 2017-09-06 ENCOUNTER — Telehealth: Payer: Self-pay | Admitting: Nurse Practitioner

## 2017-09-06 VITALS — BP 171/89 | HR 77 | Temp 98.4°F | Resp 16 | Wt 144.3 lb

## 2017-09-06 DIAGNOSIS — C180 Malignant neoplasm of cecum: Secondary | ICD-10-CM

## 2017-09-06 DIAGNOSIS — R970 Elevated carcinoembryonic antigen [CEA]: Secondary | ICD-10-CM | POA: Diagnosis not present

## 2017-09-06 DIAGNOSIS — C787 Secondary malignant neoplasm of liver and intrahepatic bile duct: Secondary | ICD-10-CM

## 2017-09-06 DIAGNOSIS — C189 Malignant neoplasm of colon, unspecified: Secondary | ICD-10-CM

## 2017-09-06 DIAGNOSIS — I82409 Acute embolism and thrombosis of unspecified deep veins of unspecified lower extremity: Secondary | ICD-10-CM | POA: Diagnosis not present

## 2017-09-06 NOTE — Telephone Encounter (Signed)
No 12/3 los.   

## 2017-09-06 NOTE — Progress Notes (Addendum)
[facility] OFFICE PROGRESS NOTE   Diagnosis: Colon cancer  INTERVAL HISTORY:   Cynthia [name] returns for follow-up.  She denies nausea/vomiting.  No mouth sores.  She developed diarrhea after the CT scan.  She does not typically have diarrhea.  She reports a good appetite.  No pain.  She remains active.  Objective:  Vital signs in last 24 hours:  Blood pressure (!) 171/89, pulse 77, temperature 98.4 F (36.9 C), temperature source Oral, resp. rate 16, weight 144 lb 4.8 oz (65.5 kg), SpO2 97 %.    HEENT: Neck without mass. Lymphatics: No palpable cervical or supraclavicular lymph nodes. Resp: Lungs clear bilaterally. Cardio: Regular rate and rhythm. GI: Abdomen soft and nontender.  No hepatomegaly. Vascular: No leg edema.   Lab Results:  Lab Results  Component Value Date   WBC 5.3 01/29/2016   HGB 11.7 01/29/2016   HCT 37.2 01/29/2016   MCV 88.8 01/29/2016   PLT 243 01/29/2016   NEUTROABS 3 12/07/2015    Imaging:  Ct Chest W Contrast  Result Date: 09/05/2017 CLINICAL DATA:  Stage 3 colon cancer diagnosed in January 2015. Remote history cervical cancer EXAM: CT CHEST, ABDOMEN, AND PELVIS WITH CONTRAST TECHNIQUE: Multidetector CT imaging of the chest, abdomen and pelvis was performed following the standard protocol during bolus administration of intravenous contrast. CONTRAST:  131m ISOVUE-300 IOPAMIDOL (ISOVUE-300) INJECTION 61% COMPARISON:  Multiple exams, including 02/08/2017 FINDINGS: CT CHEST FINDINGS Cardiovascular: Coronary, aortic arch, and branch vessel atherosclerotic vascular disease. Ectatic ascending thoracic aorta at 3.9 cm diameter. On images 51-52 of series 2 there appears to be a small left ventricular aneurysm or pseudoaneurysm along the apex, not appreciably changed over the last 4 years. Mediastinum/Nodes: No pathologic adenopathy. Lungs/Pleura: Biapical pleuroparenchymal scarring. Fluid density lesion along the left lower lobe medially has  a component that abuts the pericardium and measures 3.0 by 1.7 by 3.3 cm (volume = 8.8 cm^3), this has been enlarging over the past 3 years and on 02/08/2017 measured 2.5 by 1.2 by 3.0 cm (volume = 4.7 cm^3). This may represent an enlarging pericardial cyst but is not entirely specific. Musculoskeletal: Mild thoracic spondylosis. CT ABDOMEN PELVIS FINDINGS Hepatobiliary: Segment 8 hypodense lesion measures 2.3 by 1.1 cm on image 53/2, by my measurement this was previously 2.2 by 1.5 cm. The hypodense lesion posteriorly in the right hepatic lobe measures 3.2 by 2.5 cm on image 67/2, formerly 3.0 by 2.6 cm by my measurements. The current measurement includes a small exophytic soft tissue nodularity along the liver capsule which is new. Several notable cysts are present in the lateral segment left hepatic lobe and several other small hypodense liver lesions are fairly stable. No biliary dilatation. Pancreas: Unremarkable Spleen: Unremarkable Adrenals/Urinary Tract: Unremarkable Stomach/Bowel: Sigmoid colon diverticulosis. Orally administered contrast extends through to the rectum. Part of the right colon has been removed. Vascular/Lymphatic: Aortoiliac atherosclerotic vascular disease. Small retroperitoneal lymph nodes are not pathologically enlarged by size criteria. Reproductive: Uterus absent.  Adnexa unremarkable. Other: Along the posterior margin of the right hepatic lobe there is a 1.7 by 1.2 cm suspected tumor deposit on image 26/4 which extends partially into a small posterior diaphragmatic eventration. This lesion is stable from 02/08/2017. Musculoskeletal: Moderate to prominent left and moderate right degenerative hip arthropathy. Lumbar spondylosis and degenerative disc disease noted with suspected mild left foraminal impingement at L5-S1. IMPRESSION: 1. Mixed appearance of the hepatic metastatic lesions. A segment 8 hypodense lesion appears smaller than on the prior exam, however the  segment 6 lesion is  minimally larger and has a small component newly extending along the liver capsule. The other capsular deposit posteriorly along the right hepatic lobe is stable. 2. Fluid density lesion along the medial left lower lobe has a component that abuts the pericardium and may be a pericardial cyst but has enlarged over the past 3 years. This currently measures about 9 cubic cm and on the prior exam of 02/08/2017 measured 5 cubic cm. 3. Other imaging findings of potential clinical significance: Aortic Atherosclerosis (ICD10-I70.0). Ectatic ascending thoracic aorta. Sigmoid colon diverticulosis. Coronary atherosclerosis. Bilateral hip arthropathy. 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 Electronically Signed   By: Van Clines M.D.   On: 09/05/2017 10:34   Ct Abdomen Pelvis W Contrast  Result Date: 09/05/2017 CLINICAL DATA:  Stage 3 colon cancer diagnosed in January 2015. Remote history cervical cancer EXAM: CT CHEST, ABDOMEN, AND PELVIS WITH CONTRAST TECHNIQUE: Multidetector CT imaging of the chest, abdomen and pelvis was performed following the standard protocol during bolus administration of intravenous contrast. CONTRAST:  195m ISOVUE-300 IOPAMIDOL (ISOVUE-300) INJECTION 61% COMPARISON:  Multiple exams, including 02/08/2017 FINDINGS: CT CHEST FINDINGS Cardiovascular: Coronary, aortic arch, and branch vessel atherosclerotic vascular disease. Ectatic ascending thoracic aorta at 3.9 cm diameter. On images 51-52 of series 2 there appears to be a small left ventricular aneurysm or pseudoaneurysm along the apex, not appreciably changed over the last 4 years. Mediastinum/Nodes: No pathologic adenopathy. Lungs/Pleura: Biapical pleuroparenchymal scarring. Fluid density lesion along the left lower lobe medially has a component that abuts the pericardium and measures 3.0 by 1.7 by 3.3 cm (volume = 8.8 cm^3), this has been enlarging over the past 3 years and on  02/08/2017 measured 2.5 by 1.2 by 3.0 cm (volume = 4.7 cm^3). This may represent an enlarging pericardial cyst but is not entirely specific. Musculoskeletal: Mild thoracic spondylosis. CT ABDOMEN PELVIS FINDINGS Hepatobiliary: Segment 8 hypodense lesion measures 2.3 by 1.1 cm on image 53/2, by my measurement this was previously 2.2 by 1.5 cm. The hypodense lesion posteriorly in the right hepatic lobe measures 3.2 by 2.5 cm on image 67/2, formerly 3.0 by 2.6 cm by my measurements. The current measurement includes a small exophytic soft tissue nodularity along the liver capsule which is new. Several notable cysts are present in the lateral segment left hepatic lobe and several other small hypodense liver lesions are fairly stable. No biliary dilatation. Pancreas: Unremarkable Spleen: Unremarkable Adrenals/Urinary Tract: Unremarkable Stomach/Bowel: Sigmoid colon diverticulosis. Orally administered contrast extends through to the rectum. Part of the right colon has been removed. Vascular/Lymphatic: Aortoiliac atherosclerotic vascular disease. Small retroperitoneal lymph nodes are not pathologically enlarged by size criteria. Reproductive: Uterus absent.  Adnexa unremarkable. Other: Along the posterior margin of the right hepatic lobe there is a 1.7 by 1.2 cm suspected tumor deposit on image 26/4 which extends partially into a small posterior diaphragmatic eventration. This lesion is stable from 02/08/2017. Musculoskeletal: Moderate to prominent left and moderate right degenerative hip arthropathy. Lumbar spondylosis and degenerative disc disease noted with suspected mild left foraminal impingement at L5-S1. IMPRESSION: 1. Mixed appearance of the hepatic metastatic lesions. A segment 8 hypodense lesion appears smaller than on the prior exam, however the segment 6 lesion is minimally larger and has a small component newly extending along the liver capsule. The other capsular deposit posteriorly along the right hepatic  lobe is stable. 2. Fluid density lesion along the medial left lower lobe has a component that abuts the  pericardium and may be a pericardial cyst but has enlarged over the past 3 years. This currently measures about 9 cubic cm and on the prior exam of 02/08/2017 measured 5 cubic cm. 3. Other imaging findings of potential clinical significance: Aortic Atherosclerosis (ICD10-I70.0). Ectatic ascending thoracic aorta. Sigmoid colon diverticulosis. Coronary atherosclerosis. Bilateral hip arthropathy. 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 Electronically Signed   By: Van Clines M.D.   On: 09/05/2017 10:34    Medications: I have reviewed the patient's current medications.  Assessment/Plan: 1. Moderately differentiated adenocarcinoma of the cecum, stage III (T4a, N1), status post a laparoscopic assisted right colectomy 10/22/2013  The tumor returned microsatellite stable with normal mismatch repair protein expression   Cycle 1 adjuvant Xeloda 12/25/2013.   Cycle 2 adjuvant Xeloda 01/15/2014.   Cycle 3 adjuvant Xeloda 02/05/2014.   Cycle 4 adjuvant Xeloda 03/18/2014, discontinued after 4 days secondary to diarrhea   Cycle 5 adjuvant Xeloda 04/16/2014, Xeloda dose reduced to 1000 mg in the a.m. and 500 mg in the p.m.   Cycle 6 adjuvant Xeloda 05/07/2014.  Cycle 7 adjuvant Xeloda 05/28/2014.  Cycle 8 adjuvant Xeloda 06/18/2014.  Restaging CTs on 07/14/2014 with no evidence of metastatic colon cancer  Colonoscopy 10/02/2014 with diverticulosis in the sigmoid colon and in the descending colon. One 4 mm polyp in the sigmoid colon with complete resection. Polyp tissue not retrieved. Internal hemorrhoids. Next colonoscopy in 3 years for surveillance.  Restaging CTs 07/16/2015 with 2 new hypodense liver lesions concerning for metastases, no other evidence of disease progression  PET scan 07/28/2015 with a single hypermetabolic right  liver lesion, no other evidence of metastatic disease  Ultrasound-guided biopsy of the right liver lesion 07/30/2015 confirmed metastatic colon cancer  MRI abdomen 08/20/2015 with similar to slight decrease in size of 2 hepatic metastasis. No new liver lesions or extrahepatic metastatic disease identified.  Radiofrequency ablation of 2 liver lesions 09/25/2015  MRI abdomen 02/02/2016-coagulative necrosis at the 2 ablated liver tumor sites,with no enhancement to suggest residual or recurrent liver tumor. No new liver masses or other findings of metastatic disease in the abdomen.  MRI abdomen 08/10/2016 revealed stable ablation sites, no evidence of progressive metastatic disease  Restaging CT 02/08/2017-stable treated liver lesions, no evidence of progressive metastatic disease, stable borderline enlarged right inguinal node  CEA with stable mild elevation 02/08/2017, 03/01/2017, 03/29/2017, 04/19/2017  CEA further elevated 08/21/2017  CTs 09/05/2017- possible local recurrence of tumor along the ablation site right hepatic lobe lesion.  Cephalad to the ablation site approximately 1.9 x 2.1 cm soft tissue mass progressive compared to the prior exam. 2. Right ovary cystadenofibroma, status post a bilateral oophorectomy 10/22/2013 3. Remote history of cervical cancer. 4. Iron deficiency anemia-resolved. 5. Right calf deep vein thrombosis 10/24/2013-she completed 3 months of xarelto. 6. Indeterminate 12 mm hypermetabolic right pelvic density on the staging PET scan 10/04/2013-CT followup recommended per the GI tumor conference 11/13/2013. No longer visualized on a CT 07/14/2014. 7. Soft tissue implant overlying the posterior right liver on the CT 72/82/0601-VIF hypermetabolic on the PET scan 53/79/4327. 8. C. difficile colitis 03/05/2014. She completed a course of Flagyl, recurrent diarrhea beginning 03/21/2014, positive C. difficile toxin 03/26/2014-resumed Flagyl for planned 10 day  course.  Vancomycin beginning 04/08/2014.  Taper complete 05/21/2014.  9. CVA May 2016 10. Skin infection at the right first MTP joint-Keflex prescribed 08/06/2015 11. Guillan-Barre syndrome February 2017 12. Left lower extremity DVTs March 2017-treated with Xarelto  Disposition: Cynthia [name] appears stable.  Recent CEA was further elevated.  She was referred for CT scans which show possible local recurrence of tumor along the right hepatic lobe lesion ablation site.  Dr. Benay Spice will present her case at the upcoming GI tumor conference.  We will contact Cynthia [name] with additional recommendations following the conference.  Her next follow-up visit is in March.  We will adjust accordingly pending the GI tumor conference discussion.  Patient seen with Dr. Benay Spice.  CT report/images reviewed with Cynthia [name] and her daughter.  25 minutes were spent face-to-face at today's visit with the majority of that time involved in counseling/coordination of care.    Ned Card ANP/GNP-BC   09/06/2017  1:57 PM  This was a shared visit with Ned Card.  The CEA remains elevated.  We reviewed the CT images from 09/05/2017 with Cynthia [name] and her daughter.  I discussed the images with a radiologist.  There was evidence of possible tumor progression at the posterior liver ablation site.  She understands a biopsy may be required to establish a diagnosis of recurrent disease.  Her case will be presented at the GI tumor conference on 09/13/2017.  We will decide on further imaging versus a biopsy procedure based on the GI conference discussion.  Julieanne Manson, MD

## 2017-09-13 ENCOUNTER — Ambulatory Visit: Payer: Self-pay | Admitting: Physical Medicine & Rehabilitation

## 2017-09-14 ENCOUNTER — Telehealth: Payer: Self-pay | Admitting: Oncology

## 2017-09-14 NOTE — Telephone Encounter (Signed)
Scheduled appts per 1/31 - spoke with patient regarding appts.

## 2017-09-18 ENCOUNTER — Telehealth: Payer: Self-pay | Admitting: Oncology

## 2017-09-18 ENCOUNTER — Inpatient Hospital Stay: Payer: Medicare Other | Attending: Oncology | Admitting: Oncology

## 2017-09-18 VITALS — BP 167/74 | HR 62 | Temp 98.2°F | Resp 18 | Ht 61.0 in | Wt 146.8 lb

## 2017-09-18 DIAGNOSIS — I82409 Acute embolism and thrombosis of unspecified deep veins of unspecified lower extremity: Secondary | ICD-10-CM | POA: Insufficient documentation

## 2017-09-18 DIAGNOSIS — C189 Malignant neoplasm of colon, unspecified: Secondary | ICD-10-CM

## 2017-09-18 DIAGNOSIS — C180 Malignant neoplasm of cecum: Secondary | ICD-10-CM | POA: Insufficient documentation

## 2017-09-18 DIAGNOSIS — C787 Secondary malignant neoplasm of liver and intrahepatic bile duct: Secondary | ICD-10-CM | POA: Insufficient documentation

## 2017-09-18 NOTE — Progress Notes (Signed)
[facility] OFFICE PROGRESS NOTE   Diagnosis: Colon cancer   INTERVAL HISTORY:   Cynthia [name] returns as scheduled.  She feels well.  No new complaint.  Objective:  Vital signs in last 24 hours:  Blood pressure (!) 167/74, pulse 62, temperature 98.2 F (36.8 C), temperature source Oral, resp. rate 18, height _0  (1.549 m), weight 146 lb 12.8 oz (66.6 kg), SpO2 97 %. Physical examination-not performed today  Lab Results:   Lab Results  Component Value Date   CEA1 17.91 (H) 08/21/2017     Medications: I have reviewed the patient's current medications.   Assessment/Plan: 1. Moderately differentiated adenocarcinoma of the cecum, stage III (T4a, N1), status post a laparoscopic assisted right colectomy 10/22/2013  The tumor returned microsatellite stable with normal mismatch repair protein expression   Cycle 1 adjuvant Xeloda 12/25/2013.   Cycle 2 adjuvant Xeloda 01/15/2014.   Cycle 3 adjuvant Xeloda 02/05/2014.   Cycle 4 adjuvant Xeloda 03/18/2014, discontinued after 4 days secondary to diarrhea   Cycle 5 adjuvant Xeloda 04/16/2014, Xeloda dose reduced to 1000 mg in the a.m. and 500 mg in the p.m.   Cycle 6 adjuvant Xeloda 05/07/2014.  Cycle 7 adjuvant Xeloda 05/28/2014.  Cycle 8 adjuvant Xeloda 06/18/2014.  Restaging CTs on 07/14/2014 with no evidence of metastatic colon cancer  Colonoscopy 10/02/2014 with diverticulosis in the sigmoid colon and in the descending colon. One 4 mm polyp in the sigmoid colon with complete resection. Polyp tissue not retrieved. Internal hemorrhoids. Next colonoscopy in 3 years for surveillance.  Restaging CTs 07/16/2015 with 2 new hypodense liver lesions concerning for metastases, no other evidence of disease progression  PET scan 07/28/2015 with a single hypermetabolic right liver lesion, no other evidence of metastatic disease  Ultrasound-guided biopsy of the right liver lesion 07/30/2015 confirmed  metastatic colon cancer  MRI abdomen 08/20/2015 with similar to slight decrease in size of 2 hepatic metastasis. No new liver lesions or extrahepatic metastatic disease identified.  Radiofrequency ablation of 2 liver lesions 09/25/2015  MRI abdomen 02/02/2016-coagulative necrosis at the 2 ablated liver tumor sites,with no enhancement to suggest residual or recurrent liver tumor. No new liver masses or other findings of metastatic disease in the abdomen.  MRI abdomen 08/10/2016 revealed stable ablation sites, no evidence of progressive metastatic disease  Restaging CT 02/08/2017-stable treated liver lesions, no evidence of progressive metastatic disease, stable borderline enlarged right inguinal node  CEA with stable mild elevation 02/08/2017, 03/01/2017, 03/29/2017,04/19/2017  CEA further elevated 08/21/2017  CTs 09/05/2017- possible local recurrence of tumor along the ablation site right hepatic lobe lesion.  Cephalad to the ablation site approximately 1.9 x 2.1 cm soft tissue mass progressive compared to the prior exam. 2. Right ovary cystadenofibroma, status post a bilateral oophorectomy 10/22/2013 3. Remote history of cervical cancer. 4. Iron deficiency anemia-resolved. 5. Right calf deep vein thrombosis 10/24/2013-she completed 3 months of xarelto. 6. Indeterminate 12 mm hypermetabolic right pelvic density on the staging PET scan 10/04/2013-CT followup recommended per the GI tumor conference 11/13/2013. No longer visualized on a CT 07/14/2014. 7. Soft tissue implant overlying the posterior right liver on the CT 92/42/6834-HDQ hypermetabolic on the PET scan 22/29/7989. 8. C. difficile colitis 03/05/2014. She completed a course of Flagyl, recurrent diarrhea beginning 03/21/2014, positive C. difficile toxin 03/26/2014-resumed Flagyl for planned 10 day course.  Vancomycin beginning 04/08/2014.  Taper complete 05/21/2014.  9. CVA May 2016 10. Skin infection at the right first MTP  joint-Keflex prescribed 08/06/2015 11. Guillan-Barre syndrome February  2017 12. Left lower extremity DVTs March 2017-treated with Xarelto   Disposition: She appears unchanged.  Her case was presented at the GI tumor conference last week.  There appears to be a new hepatic lesion superior to the right liver ablation site.  The recommendation from the GI conference is to consider ablation or stereotactic radiosurgery.  Cynthia [name] does not wish to consider repeat ablation.  She agrees to a consultation with Cynthia [name] to consider SBRT.  There are multiple systemic treatment options that can be considered for future treatment.  15 minutes were spent with the patient today.  The majority of the time was used for counseling and coordination of care.  Cynthia Coder, MD  09/18/2017  10:30 AM

## 2017-09-18 NOTE — Telephone Encounter (Signed)
Gave patient avs and calendar with appts per 2/4 los.  °

## 2017-09-19 NOTE — Progress Notes (Signed)
Histology and Location of Primary Cancer: Moderately differentiated adenocarcinoma of the cecum, stage III (T4a, N1), status post a laparoscopic assisted right colectomy 10/22/2013   Location(s) of Symptomatic tumor(s): 09/18/17 Dr. Benay Spice documents: New hepatic lesion superior to the right liver ablation site.     Past/Anticipated chemotherapy by medical oncology, if any:  09/18/17 Dr. Benay Spice:  Cycle 1 adjuvant Xeloda 12/25/2013.   Cycle 2 adjuvant Xeloda 01/15/2014.   Cycle 3 adjuvant Xeloda 02/05/2014.   Cycle 4 adjuvant Xeloda 03/18/2014, discontinued after 4 days secondary to diarrhea   Cycle 5 adjuvant Xeloda 04/16/2014, Xeloda dose reduced to 1000 mg in the a.m. and 500 mg in the p.m.   Cycle 6 adjuvant Xeloda 05/07/2014.  Cycle 7 adjuvant Xeloda 05/28/2014.  Cycle 8 adjuvant Xeloda 06/18/2014.  Restaging CTs on 07/14/2014 with no evidence of metastatic colon cancer    Restaging CTs 07/16/2015 with 2 new hypodense liver lesions concerning for metastases, no other evidence of disease progression  PET scan 07/28/2015 with a single hypermetabolic right liver lesion, no other evidence of metastatic disease  Ultrasound-guided biopsy of the right liver lesion 07/30/2015 confirmed metastatic colon cancer  MRI abdomen 08/20/2015 with similar to slight decrease in size of 2 hepatic metastasis. No new liver lesions or extrahepatic metastatic disease identified.  Radiofrequency ablation of 2 liver lesions 09/25/2015  MRI abdomen 02/02/2016-coagulative necrosis at the 2 ablated liver tumor sites,with no enhancement to suggest residual or recurrent liver tumor. No new liver masses or other findings of metastatic disease in the abdomen.  MRI abdomen 08/10/2016 revealed stable ablation sites, no evidence of progressive metastatic disease    CTs 09/05/2017-possible local recurrence of tumor along the ablation site right hepatic lobe lesion. Cephalad to the ablation site  approximately 1.9 x 2.1cmsoft tissue mass progressive compared to the prior exam. Disposition: She appears unchanged.  Her case was presented at the GI tumor conference last week.  There appears to be a new hepatic lesion superior to the right liver ablation site.  The recommendation from the GI conference is to consider ablation or stereotactic radiosurgery.  Ms. Eckford does not wish to consider repeat ablation.  She agrees to a consultation with Dr. Lisbeth Renshaw to consider SBRT.   Patient's main complaints related to symptomatic tumor(s) are: N/A  Pain on a scale of 0-10 is:  She denies. She does have peripheral neuropathy to her bilateral feet and hands.   Ambulatory status? Walker? Wheelchair?: She uses a cane.   SAFETY ISSUES:  Prior radiation? No  Pacemaker/ICD? No  Possible current pregnancy? No  Is the patient on methotrexate? No  Additional Complaints / other details:    BP (!) 152/80   Pulse 73   Temp 98.3 F (36.8 C)   Ht 5\' 1"  (1.549 m)   Wt 146 lb 3.2 oz (66.3 kg)   SpO2 95% Comment: room air  BMI 27.62 kg/m    Wt Readings from Last 3 Encounters:  09/21/17 146 lb 3.2 oz (66.3 kg)  09/18/17 146 lb 12.8 oz (66.6 kg)  09/06/17 144 lb 4.8 oz (65.5 kg)

## 2017-09-21 ENCOUNTER — Ambulatory Visit
Admission: RE | Admit: 2017-09-21 | Discharge: 2017-09-21 | Disposition: A | Discharge status: Home / Self Care | Payer: Medicare Other | Source: Ambulatory Visit | Attending: Radiation Oncology | Admitting: Radiation Oncology

## 2017-09-21 ENCOUNTER — Encounter: Payer: Self-pay | Admitting: Radiation Oncology

## 2017-09-21 VITALS — BP 152/80 | HR 73 | Temp 98.3°F | Ht 61.0 in | Wt 146.2 lb

## 2017-09-21 DIAGNOSIS — Z8673 Personal history of transient ischemic attack (TIA), and cerebral infarction without residual deficits: Secondary | ICD-10-CM | POA: Diagnosis not present

## 2017-09-21 DIAGNOSIS — C787 Secondary malignant neoplasm of liver and intrahepatic bile duct: Secondary | ICD-10-CM | POA: Diagnosis present

## 2017-09-21 DIAGNOSIS — Z85038 Personal history of other malignant neoplasm of large intestine: Secondary | ICD-10-CM | POA: Diagnosis not present

## 2017-09-21 DIAGNOSIS — Z885 Allergy status to narcotic agent status: Secondary | ICD-10-CM | POA: Insufficient documentation

## 2017-09-21 DIAGNOSIS — Z90722 Acquired absence of ovaries, bilateral: Secondary | ICD-10-CM | POA: Diagnosis not present

## 2017-09-21 DIAGNOSIS — I10 Essential (primary) hypertension: Secondary | ICD-10-CM | POA: Insufficient documentation

## 2017-09-21 DIAGNOSIS — Z86718 Personal history of other venous thrombosis and embolism: Secondary | ICD-10-CM | POA: Insufficient documentation

## 2017-09-21 DIAGNOSIS — Z79899 Other long term (current) drug therapy: Secondary | ICD-10-CM | POA: Diagnosis not present

## 2017-09-21 DIAGNOSIS — Z8249 Family history of ischemic heart disease and other diseases of the circulatory system: Secondary | ICD-10-CM | POA: Insufficient documentation

## 2017-09-21 DIAGNOSIS — Z9049 Acquired absence of other specified parts of digestive tract: Secondary | ICD-10-CM | POA: Diagnosis not present

## 2017-09-21 DIAGNOSIS — Z806 Family history of leukemia: Secondary | ICD-10-CM | POA: Insufficient documentation

## 2017-09-21 DIAGNOSIS — Z90710 Acquired absence of both cervix and uterus: Secondary | ICD-10-CM | POA: Insufficient documentation

## 2017-09-21 DIAGNOSIS — Z8261 Family history of arthritis: Secondary | ICD-10-CM | POA: Insufficient documentation

## 2017-09-21 DIAGNOSIS — Z8541 Personal history of malignant neoplasm of cervix uteri: Secondary | ICD-10-CM | POA: Diagnosis not present

## 2017-09-21 DIAGNOSIS — Z833 Family history of diabetes mellitus: Secondary | ICD-10-CM | POA: Diagnosis not present

## 2017-09-21 DIAGNOSIS — C189 Malignant neoplasm of colon, unspecified: Secondary | ICD-10-CM

## 2017-09-21 HISTORY — DX: Guillain-Barre syndrome: G61.0

## 2017-09-21 NOTE — Progress Notes (Signed)
Radiation Oncology         (336) [phone] ________________________________  Name: Cynthia [name]        MRN: [id number]  Date of Service: 09/21/2017 DOB: [birth year]  PP:IRJJOAC, Ivin Booty, MD  Ladell Pier, MD     REFERRING PHYSICIAN: Ladell Pier, MD   DIAGNOSIS: The primary encounter diagnosis was Metastasis to liver [facility]). Diagnoses of Colon carcinoma metastatic to liver [facility]) and Colon cancer metastasized to liver [facility]) were also pertinent to this visit.   HISTORY OF PRESENT ILLNESS: Cynthia [name] is a 82 y.o. female seen at the request of Dr. Benay Spice for a history of progressive metastatic colon cancer. The patient was originally diagnosed with her cancer in March 2015, she underwent right colectomy for T for A and 1 disease, she completed adjuvant Xeloda and was NED following that until December 2016 when she was found to have 2 new hypodense liver lesions concerning for metastasis, a PET scan at that time revealed a single focus in the right liver, and ultrasound-guided biopsy confirmed metastatic disease.  She was counseled on options for ablation versus stereotactic radiotherapy, and proceeded with ablation under the care of Dr. Kathlene Cote she has been followed since without evidence of other sites of progression, and her CEA has revealed mild elevation but was otherwise stable.  In January her CEA however increased up to 17.91, and additional imaging with a CT chest abdomen and pelvis on 09/05/2017 and revealed concern for an increase in the lesion that had been treated near the capsule in the right liver.  Following her ablation, she did develop Guillain Barr syndrome, and hence has reservations about undergoing ablation again.  She comes to discuss the options for stereotactic radiotherapy.    PREVIOUS RADIATION THERAPY: No   PAST MEDICAL HISTORY:  Past Medical History:  Diagnosis Date  . Anemia   . Blood transfusion without reported diagnosis jan 2015  . C.  difficile colitis 03/05/14, 03/22/14, 04/05/14   recurrent  . Cancer ([place])    liver  . Cervical cancer ([place]) 1972  . Colon cancer ([place]) JAN 2015  . Colon cancer ([place])   . Complication of anesthesia    slow to awaken in past, DONE WELL RECENTLY  . DVT (deep venous thrombosis) ([place]) 10/24/13   Right leg  . Guillain-Barre ([place]) 09/2015  . Heart murmur   . Hypertension    no bp meds   . Liver cyst   . Stroke ([place]) 12/2014   NUMBNESS ON LEFT SIDE  . Tinnitus of both ears ALL THE TIME       PAST SURGICAL HISTORY: Past Surgical History:  Procedure Laterality Date  . ABDOMINAL HYSTERECTOMY     vaginal- partial  . BACK SURGERY  1981   lower lumbar  . ESOPHAGOGASTRODUODENOSCOPY N/A 10/14/2015   Procedure: ESOPHAGOGASTRODUODENOSCOPY (EGD);  Surgeon: Clarene Essex, MD;  [facility] ENDOSCOPY;  Service: Endoscopy;  Laterality: N/A;  Bedside in ICU  . IR GENERIC HISTORICAL  09/07/2016   IR RADIOLOGIST EVAL & MGMT 09/07/2016 Aletta Edouard, MD [facility] INTERV RAD  . LAPAROSCOPIC RIGHT COLECTOMY Right 10/22/2013   Procedure: LAPAROSCOPIC RIGHT COLECTOMY ;  Surgeon: [name], MD;  Location: [facility] ORS;  Service: General;  Laterality: Right;  . SALPINGOOPHORECTOMY  10/22/2013   Procedure: BILATERALSALPINGO OOPHORECTOMY;  Surgeon: Imagene Gurney A. [name], MD;  Location: [facility] ORS;  Service: Gynecology;;  . TUBAL LIGATION  1968     FAMILY HISTORY:  Family History  Problem Relation Age of  Onset  . Diabetes Sister   . Cancer Sister        leukemia  . Diabetes Brother   . Heart failure Brother   . Hypertension Brother   . Rheum arthritis Mother   . Heart failure Father   . Breast cancer Maternal Aunt   . Cancer Maternal Grandmother        GIST  . Heart failure Maternal Grandfather   . Aneurysm Paternal Grandmother      SOCIAL HISTORY:  reports that  has never smoked. she has never used smokeless tobacco. She reports that she does not drink alcohol or use drugs.   ALLERGIES: Morphine and  related   MEDICATIONS:  Current Outpatient Medications  Medication Sig Dispense Refill  . acetaminophen (TYLENOL) 500 MG tablet Take 500 mg by mouth every 6 (six) hours as needed for mild pain.    Marland Kitchen atorvastatin (LIPITOR) 40 MG tablet Take 1 tablet (40 mg total) by mouth daily. 90 tablet 0  . gabapentin (NEURONTIN) 400 MG capsule Take 1 capsule (400 mg total) by mouth 3 (three) times daily. May take additional dose at night PRN 100 capsule 3  . metoprolol succinate (TOPROL-XL) 25 MG 24 hr tablet Take 37.5 mg by mouth every evening.     . Multiple Vitamin (MULTIVITAMIN WITH MINERALS) TABS tablet Take 1 tablet by mouth every morning.     . pantoprazole (PROTONIX) 40 MG tablet TAKE 1 TABLET BY MOUTH EVERY DAY (Patient not taking: Reported on 09/06/2017) 30 tablet 0   No current facility-administered medications for this encounter.      REVIEW OF SYSTEMS: On review of systems, the patient reports that she is doing well overall. She denies any chest pain, shortness of breath, cough, fevers, chills, night sweats, unintended weight changes. She denies any bowel or bladder disturbances, and denies abdominal pain, nausea or vomiting. She denies any new musculoskeletal or joint aches or pains. A complete review of systems is obtained and is otherwise negative.     PHYSICAL EXAM:  Wt Readings from Last 3 Encounters:  09/21/17 146 lb 3.2 oz (66.3 kg)  09/18/17 146 lb 12.8 oz (66.6 kg)  09/06/17 144 lb 4.8 oz (65.5 kg)   Temp Readings from Last 3 Encounters:  09/21/17 98.3 F (36.8 C)  09/18/17 98.2 F (36.8 C) (Oral)  09/06/17 98.4 F (36.9 C) (Oral)   BP Readings from Last 3 Encounters:  09/21/17 (!) 152/80  09/18/17 (!) 167/74  09/06/17 (!) 171/89   Pulse Readings from Last 3 Encounters:  09/21/17 73  09/18/17 62  09/06/17 77   Pain Assessment Pain Score: 0-No pain/10  In general this is a well appearing caucasian female in no acute distress. She is alert and oriented x4 and  appropriate throughout the examination. HEENT reveals that the patient is normocephalic, atraumatic. EOMs are intact. PERRLA. Skin is intact without any evidence of gross lesions. Cardiovascular exam reveals a regular rate and rhythm, no clicks rubs or murmurs are auscultated. Chest is clear to auscultation bilaterally. Lymphatic assessment is performed and does not reveal any adenopathy in the cervical, supraclavicular, axillary, or inguinal chains. Abdomen has active bowel sounds in all quadrants and is intact. The abdomen is soft, non tender, non distended. Lower extremities are negative for pretibial pitting edema, deep calf tenderness, cyanosis or clubbing.   ECOG = 0  0 - Asymptomatic (Fully active, able to carry on all predisease activities without restriction)  1 - Symptomatic but completely ambulatory (Restricted in  physically strenuous activity but ambulatory and able to carry out work of a light or sedentary nature. For example, light housework, office work)  2 - Symptomatic, <50% in bed during the day (Ambulatory and capable of all self care but unable to carry out any work activities. Up and about more than 50% of waking hours)  3 - Symptomatic, >50% in bed, but not bedbound (Capable of only limited self-care, confined to bed or chair 50% or more of waking hours)  4 - Bedbound (Completely disabled. Cannot carry on any self-care. Totally confined to bed or chair)  5 - Death   Eustace Pen MM, Creech RH, Tormey DC, et al. [phone]). "Toxicity and response criteria of the [facility]". [place] Oncol. 5 (6): 649-55    LABORATORY DATA:  Lab Results  Component Value Date   WBC 5.3 01/29/2016   HGB 11.7 01/29/2016   HCT 37.2 01/29/2016   MCV 88.8 01/29/2016   PLT 243 01/29/2016   Lab Results  Component Value Date   NA 142 09/05/2017   K 4.3 09/05/2017   CL 104 09/05/2017   CO2 28 09/05/2017   Lab Results  Component Value Date   ALT 19 09/05/2017   AST 27  09/05/2017   ALKPHOS 56 09/05/2017   BILITOT 0.5 09/05/2017      RADIOGRAPHY: Ct Chest W Contrast  Addendum Date: 09/06/2017   ADDENDUM REPORT: 09/06/2017 14:32 ADDENDUM: The original report was by Dr. Van Clines. The following addendum is by Dr. Van Clines: I discussed this case by telephone with Dr. Julieanne Manson at 2:20 p.m. on 09/06/2017. Dr. Benay Spice brought to my attention some additional history that at least some of the hypodense appearance of the posterior right hepatic lobe lesion is related to a prior ablation in 2017. In this context, my suspicion is high that there is local recurrence of tumor along the ablation site. Some of this is based on the new posterior nodularity along the capsular margin of this lesion. Even more notably, I believe that cephalad to the ablation site there is an approximately 1.9 by 2.1 cm soft tissue mass which is progressive compared to the prior exam and highly suspicious for local recurrence. I discussed this with Dr. Benay Spice and we talked about reviewing the patient further at tumor board and options for biopsy. Electronically Signed   By: Van Clines M.D.   On: 09/06/2017 14:32   Result Date: 09/06/2017 CLINICAL DATA:  Stage 3 colon cancer diagnosed in January 2015. Remote history cervical cancer EXAM: CT CHEST, ABDOMEN, AND PELVIS WITH CONTRAST TECHNIQUE: Multidetector CT imaging of the chest, abdomen and pelvis was performed following the standard protocol during bolus administration of intravenous contrast. CONTRAST:  121mL ISOVUE-300 IOPAMIDOL (ISOVUE-300) INJECTION 61% COMPARISON:  Multiple exams, including 02/08/2017 FINDINGS: CT CHEST FINDINGS Cardiovascular: Coronary, aortic arch, and branch vessel atherosclerotic vascular disease. Ectatic ascending thoracic aorta at 3.9 cm diameter. On images 51-52 of series 2 there appears to be a small left ventricular aneurysm or pseudoaneurysm along the apex, not appreciably changed over the  last 4 years. Mediastinum/Nodes: No pathologic adenopathy. Lungs/Pleura: Biapical pleuroparenchymal scarring. Fluid density lesion along the left lower lobe medially has a component that abuts the pericardium and measures 3.0 by 1.7 by 3.3 cm (volume = 8.8 cm^3), this has been enlarging over the past 3 years and on 02/08/2017 measured 2.5 by 1.2 by 3.0 cm (volume = 4.7 cm^3). This may represent an enlarging pericardial cyst but is not  entirely specific. Musculoskeletal: Mild thoracic spondylosis. CT ABDOMEN PELVIS FINDINGS Hepatobiliary: Segment 8 hypodense lesion measures 2.3 by 1.1 cm on image 53/2, by my measurement this was previously 2.2 by 1.5 cm. The hypodense lesion posteriorly in the right hepatic lobe measures 3.2 by 2.5 cm on image 67/2, formerly 3.0 by 2.6 cm by my measurements. The current measurement includes a small exophytic soft tissue nodularity along the liver capsule which is new. Several notable cysts are present in the lateral segment left hepatic lobe and several other small hypodense liver lesions are fairly stable. No biliary dilatation. Pancreas: Unremarkable Spleen: Unremarkable Adrenals/Urinary Tract: Unremarkable Stomach/Bowel: Sigmoid colon diverticulosis. Orally administered contrast extends through to the rectum. Part of the right colon has been removed. Vascular/Lymphatic: Aortoiliac atherosclerotic vascular disease. Small retroperitoneal lymph nodes are not pathologically enlarged by size criteria. Reproductive: Uterus absent.  Adnexa unremarkable. Other: Along the posterior margin of the right hepatic lobe there is a 1.7 by 1.2 cm suspected tumor deposit on image 26/4 which extends partially into a small posterior diaphragmatic eventration. This lesion is stable from 02/08/2017. Musculoskeletal: Moderate to prominent left and moderate right degenerative hip arthropathy. Lumbar spondylosis and degenerative disc disease noted with suspected mild left foraminal impingement at L5-S1.  IMPRESSION: 1. Mixed appearance of the hepatic metastatic lesions. A segment 8 hypodense lesion appears smaller than on the prior exam, however the segment 6 lesion is minimally larger and has a small component newly extending along the liver capsule. The other capsular deposit posteriorly along the right hepatic lobe is stable. 2. Fluid density lesion along the medial left lower lobe has a component that abuts the pericardium and may be a pericardial cyst but has enlarged over the past 3 years. This currently measures about 9 cubic cm and on the prior exam of 02/08/2017 measured 5 cubic cm. 3. Other imaging findings of potential clinical significance: Aortic Atherosclerosis (ICD10-I70.0). Ectatic ascending thoracic aorta. Sigmoid colon diverticulosis. Coronary atherosclerosis. Bilateral hip arthropathy. 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 Electronically Signed: By: Van Clines M.D. On: 09/05/2017 10:34   Ct Abdomen Pelvis W Contrast  Addendum Date: 09/06/2017   ADDENDUM REPORT: 09/06/2017 14:32 ADDENDUM: The original report was by Dr. Van Clines. The following addendum is by Dr. Van Clines: I discussed this case by telephone with Dr. Julieanne Manson at 2:20 p.m. on 09/06/2017. Dr. Benay Spice brought to my attention some additional history that at least some of the hypodense appearance of the posterior right hepatic lobe lesion is related to a prior ablation in 2017. In this context, my suspicion is high that there is local recurrence of tumor along the ablation site. Some of this is based on the new posterior nodularity along the capsular margin of this lesion. Even more notably, I believe that cephalad to the ablation site there is an approximately 1.9 by 2.1 cm soft tissue mass which is progressive compared to the prior exam and highly suspicious for local recurrence. I discussed this with Dr. Benay Spice and we talked about reviewing the  patient further at tumor board and options for biopsy. Electronically Signed   By: Van Clines M.D.   On: 09/06/2017 14:32   Result Date: 09/06/2017 CLINICAL DATA:  Stage 3 colon cancer diagnosed in January 2015. Remote history cervical cancer EXAM: CT CHEST, ABDOMEN, AND PELVIS WITH CONTRAST TECHNIQUE: Multidetector CT imaging of the chest, abdomen and pelvis was performed following the standard protocol during bolus administration of intravenous contrast. CONTRAST:  17mL ISOVUE-300 IOPAMIDOL (ISOVUE-300) INJECTION 61% COMPARISON:  Multiple exams, including 02/08/2017 FINDINGS: CT CHEST FINDINGS Cardiovascular: Coronary, aortic arch, and branch vessel atherosclerotic vascular disease. Ectatic ascending thoracic aorta at 3.9 cm diameter. On images 51-52 of series 2 there appears to be a small left ventricular aneurysm or pseudoaneurysm along the apex, not appreciably changed over the last 4 years. Mediastinum/Nodes: No pathologic adenopathy. Lungs/Pleura: Biapical pleuroparenchymal scarring. Fluid density lesion along the left lower lobe medially has a component that abuts the pericardium and measures 3.0 by 1.7 by 3.3 cm (volume = 8.8 cm^3), this has been enlarging over the past 3 years and on 02/08/2017 measured 2.5 by 1.2 by 3.0 cm (volume = 4.7 cm^3). This may represent an enlarging pericardial cyst but is not entirely specific. Musculoskeletal: Mild thoracic spondylosis. CT ABDOMEN PELVIS FINDINGS Hepatobiliary: Segment 8 hypodense lesion measures 2.3 by 1.1 cm on image 53/2, by my measurement this was previously 2.2 by 1.5 cm. The hypodense lesion posteriorly in the right hepatic lobe measures 3.2 by 2.5 cm on image 67/2, formerly 3.0 by 2.6 cm by my measurements. The current measurement includes a small exophytic soft tissue nodularity along the liver capsule which is new. Several notable cysts are present in the lateral segment left hepatic lobe and several other small hypodense liver lesions are  fairly stable. No biliary dilatation. Pancreas: Unremarkable Spleen: Unremarkable Adrenals/Urinary Tract: Unremarkable Stomach/Bowel: Sigmoid colon diverticulosis. Orally administered contrast extends through to the rectum. Part of the right colon has been removed. Vascular/Lymphatic: Aortoiliac atherosclerotic vascular disease. Small retroperitoneal lymph nodes are not pathologically enlarged by size criteria. Reproductive: Uterus absent.  Adnexa unremarkable. Other: Along the posterior margin of the right hepatic lobe there is a 1.7 by 1.2 cm suspected tumor deposit on image 26/4 which extends partially into a small posterior diaphragmatic eventration. This lesion is stable from 02/08/2017. Musculoskeletal: Moderate to prominent left and moderate right degenerative hip arthropathy. Lumbar spondylosis and degenerative disc disease noted with suspected mild left foraminal impingement at L5-S1. IMPRESSION: 1. Mixed appearance of the hepatic metastatic lesions. A segment 8 hypodense lesion appears smaller than on the prior exam, however the segment 6 lesion is minimally larger and has a small component newly extending along the liver capsule. The other capsular deposit posteriorly along the right hepatic lobe is stable. 2. Fluid density lesion along the medial left lower lobe has a component that abuts the pericardium and may be a pericardial cyst but has enlarged over the past 3 years. This currently measures about 9 cubic cm and on the prior exam of 02/08/2017 measured 5 cubic cm. 3. Other imaging findings of potential clinical significance: Aortic Atherosclerosis (ICD10-I70.0). Ectatic ascending thoracic aorta. Sigmoid colon diverticulosis. Coronary atherosclerosis. Bilateral hip arthropathy. 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 Electronically Signed: By: Van Clines M.D. On: 09/05/2017 10:34       IMPRESSION/PLAN: 1. Progressive Stage III, T41N1  adenocarcinoma of the right colon with liver metastases. Dr. Lisbeth Renshaw discusses the patient's course and reviews the rationale to proceed with MRI of the abdomen rather than fiducial markers due to her history of Guillain Barr. We discussed the risks, benefits, short, and long term effects of radiotherapy, and the patient is interested in proceeding. Dr. Lisbeth Renshaw discusses the delivery and logistics of radiotherapy and anticipates a course of 3-5 fractions. Written consent is obtained and placed in the chart, a copy was provided to the patient. We will coordinate simulation once we know the date  of her MRI.   In a visit lasting 45 minutes, greater than 50% of the time was spent face to face discussing her case, and coordinating the patient's care.   The above documentation reflects my direct findings during this shared patient visit. Please see the separate note by Dr. Lisbeth Renshaw on this date for the remainder of the patient's plan of care.    Carola Rhine, PAC

## 2017-09-25 ENCOUNTER — Other Ambulatory Visit: Payer: Self-pay | Admitting: Radiation Oncology

## 2017-09-25 DIAGNOSIS — C189 Malignant neoplasm of colon, unspecified: Secondary | ICD-10-CM

## 2017-09-25 DIAGNOSIS — C787 Secondary malignant neoplasm of liver and intrahepatic bile duct: Principal | ICD-10-CM

## 2017-09-26 ENCOUNTER — Telehealth: Payer: Self-pay | Admitting: *Deleted

## 2017-09-26 NOTE — Telephone Encounter (Signed)
Called patient to inform of MRI for 09-28-17- arrival time- 6:30 am @ [facility], pt. to be NPO- 4 hrs. prior to test, lvm for a return call

## 2017-09-28 ENCOUNTER — Ambulatory Visit (HOSPITAL_COMMUNITY)
Admission: RE | Admit: 2017-09-28 | Discharge: 2017-09-28 | Disposition: A | Discharge status: Home / Self Care | Payer: Medicare Other | Source: Ambulatory Visit | Attending: Radiation Oncology | Admitting: Radiation Oncology

## 2017-09-28 DIAGNOSIS — C189 Malignant neoplasm of colon, unspecified: Secondary | ICD-10-CM

## 2017-09-28 DIAGNOSIS — K7689 Other specified diseases of liver: Secondary | ICD-10-CM | POA: Insufficient documentation

## 2017-09-28 DIAGNOSIS — C787 Secondary malignant neoplasm of liver and intrahepatic bile duct: Secondary | ICD-10-CM | POA: Insufficient documentation

## 2017-09-28 MED ORDER — GADOBENATE DIMEGLUMINE 529 MG/ML IV SOLN
15.0000 mL | Freq: Once | INTRAVENOUS | Status: AC | PRN
  Administered 2017-09-28: 14 mL via INTRAVENOUS

## 2017-10-04 ENCOUNTER — Telehealth: Payer: Self-pay | Admitting: Nurse Practitioner

## 2017-10-04 NOTE — Telephone Encounter (Signed)
R/s appt per 2/20 sch message - patient is aware of appt date and time.

## 2017-10-05 ENCOUNTER — Ambulatory Visit: Payer: Medicare Other

## 2017-10-05 ENCOUNTER — Ambulatory Visit: Payer: Medicare Other | Admitting: Radiation Oncology

## 2017-10-10 ENCOUNTER — Ambulatory Visit
Admission: RE | Admit: 2017-10-10 | Discharge: 2017-10-10 | Disposition: A | Discharge status: Home / Self Care | Payer: Medicare Other | Source: Ambulatory Visit | Attending: Radiation Oncology | Admitting: Radiation Oncology

## 2017-10-10 VITALS — BP 146/76 | HR 59 | Temp 98.2°F | Resp 18 | Ht 61.0 in | Wt 144.6 lb

## 2017-10-10 DIAGNOSIS — C787 Secondary malignant neoplasm of liver and intrahepatic bile duct: Secondary | ICD-10-CM | POA: Diagnosis not present

## 2017-10-10 DIAGNOSIS — C189 Malignant neoplasm of colon, unspecified: Secondary | ICD-10-CM

## 2017-10-10 DIAGNOSIS — Z510 Encounter for antineoplastic radiation therapy: Secondary | ICD-10-CM | POA: Diagnosis present

## 2017-10-10 MED ORDER — SODIUM CHLORIDE 0.9% FLUSH
10.0000 mL | Freq: Once | INTRAVENOUS | Status: AC
  Administered 2017-10-10: 10 mL via INTRAVENOUS

## 2017-10-10 NOTE — Progress Notes (Signed)
Has armband been applied?  Yes  Does patient have an allergy to IV contrast dye?: No   Has patient ever received premedication for IV contrast dye?: No   Does patient take metformin?: No  If patient does take metformin when was the last dose: No  Date of lab work: 09/05/2017 BUN: 13 CR: 0.94 EGFR: 56  IV site: Left forarm  Has IV site been added to flowsheet?  Yes  BP (!) 146/76 (BP Location: Right Arm, Patient Position: Sitting, Cuff Size: Normal)   Pulse (!) 59   Temp 98.2 F (36.8 C) (Oral)   Resp 18   Ht 5' 1"  (1.549 m)   Wt 144 lb 9.6 oz (65.6 kg)   SpO2 98%   BMI 27.32 kg/m

## 2017-10-13 DIAGNOSIS — C787 Secondary malignant neoplasm of liver and intrahepatic bile duct: Secondary | ICD-10-CM | POA: Insufficient documentation

## 2017-10-13 DIAGNOSIS — Z510 Encounter for antineoplastic radiation therapy: Secondary | ICD-10-CM | POA: Diagnosis present

## 2017-10-13 DIAGNOSIS — C182 Malignant neoplasm of ascending colon: Secondary | ICD-10-CM | POA: Insufficient documentation

## 2017-10-16 ENCOUNTER — Other Ambulatory Visit: Payer: Self-pay

## 2017-10-17 ENCOUNTER — Ambulatory Visit
Admission: RE | Admit: 2017-10-17 | Discharge: 2017-10-17 | Disposition: A | Discharge status: Home / Self Care | Payer: Medicare Other | Source: Ambulatory Visit | Attending: Radiation Oncology | Admitting: Radiation Oncology

## 2017-10-17 DIAGNOSIS — Z510 Encounter for antineoplastic radiation therapy: Secondary | ICD-10-CM | POA: Diagnosis not present

## 2017-10-19 ENCOUNTER — Ambulatory Visit: Payer: Self-pay | Admitting: Oncology

## 2017-10-19 ENCOUNTER — Ambulatory Visit
Admission: RE | Admit: 2017-10-19 | Discharge: 2017-10-19 | Disposition: A | Discharge status: Home / Self Care | Payer: Medicare Other | Source: Ambulatory Visit | Attending: Radiation Oncology | Admitting: Radiation Oncology

## 2017-10-19 DIAGNOSIS — Z510 Encounter for antineoplastic radiation therapy: Secondary | ICD-10-CM | POA: Diagnosis not present

## 2017-10-20 ENCOUNTER — Other Ambulatory Visit: Payer: Self-pay | Admitting: Physical Medicine & Rehabilitation

## 2017-10-23 ENCOUNTER — Ambulatory Visit
Admission: RE | Admit: 2017-10-23 | Discharge: 2017-10-23 | Disposition: A | Discharge status: Home / Self Care | Payer: Medicare Other | Source: Ambulatory Visit | Attending: Radiation Oncology | Admitting: Radiation Oncology

## 2017-10-23 DIAGNOSIS — Z510 Encounter for antineoplastic radiation therapy: Secondary | ICD-10-CM | POA: Diagnosis not present

## 2017-10-24 ENCOUNTER — Ambulatory Visit: Payer: Medicare Other | Admitting: Radiation Oncology

## 2017-10-25 ENCOUNTER — Ambulatory Visit
Admission: RE | Admit: 2017-10-25 | Discharge: 2017-10-25 | Disposition: A | Discharge status: Home / Self Care | Payer: Medicare Other | Source: Ambulatory Visit | Attending: Radiation Oncology | Admitting: Radiation Oncology

## 2017-10-25 DIAGNOSIS — Z510 Encounter for antineoplastic radiation therapy: Secondary | ICD-10-CM | POA: Diagnosis not present

## 2017-10-27 ENCOUNTER — Encounter: Payer: Self-pay | Admitting: Radiation Oncology

## 2017-10-27 ENCOUNTER — Encounter: Payer: Self-pay | Admitting: Urology

## 2017-10-27 ENCOUNTER — Ambulatory Visit
Admission: RE | Admit: 2017-10-27 | Discharge: 2017-10-27 | Disposition: A | Discharge status: Home / Self Care | Payer: Medicare Other | Source: Ambulatory Visit | Attending: Radiation Oncology | Admitting: Radiation Oncology

## 2017-10-27 DIAGNOSIS — Z510 Encounter for antineoplastic radiation therapy: Secondary | ICD-10-CM | POA: Diagnosis not present

## 2017-10-31 NOTE — Progress Notes (Signed)
  Radiation Oncology         (336) [phone] ________________________________  Name: Cynthia [name] MRN: [id number]  Date: 10/27/2017  DOB: June 29, 1936  End of Treatment Note  Diagnosis:   Progressive Stage III, T41N1 adenocarcinoma of the right colon with liver metastases     Indication for treatment:  Curative       Radiation treatment dates:   35/19, 10/19/17, 10/23/17, 10/25/17 & 10/27/17  Site/dose:   Liver SBRT treated to 50 Gy in 5 fractions of 10 Gy each  Beams/energy:  VMAT/ 6X-FFF  Narrative: The patient tolerated radiation treatment relatively well.  She did experience modest fatigue as well as mild diarrhea which was relieved with eating cheese.  She denied lower urinary sxs, nausea vomiting or constipation.  She did develop some tenderness to palpation over the right lower abdomen in the treatment field but denied any significant skin irritation in the treatment field.  Plan: The patient has completed radiation treatment. She will return to radiation oncology clinic for routine followup in one month. I advised them to call or return sooner if they have any questions or concerns related to their recovery or treatment.     Nicholos Johns, PA-C  ------------------------------------------------  Jodelle Gross, MD, PhD

## 2017-11-01 ENCOUNTER — Encounter: Payer: Medicare Other | Attending: Physical Medicine & Rehabilitation | Admitting: Physical Medicine & Rehabilitation

## 2017-11-01 ENCOUNTER — Encounter: Payer: Self-pay | Admitting: Physical Medicine & Rehabilitation

## 2017-11-01 VITALS — BP 182/102 | HR 74

## 2017-11-01 DIAGNOSIS — R200 Anesthesia of skin: Secondary | ICD-10-CM | POA: Diagnosis present

## 2017-11-01 DIAGNOSIS — C787 Secondary malignant neoplasm of liver and intrahepatic bile duct: Secondary | ICD-10-CM | POA: Diagnosis not present

## 2017-11-01 DIAGNOSIS — R202 Paresthesia of skin: Secondary | ICD-10-CM

## 2017-11-01 DIAGNOSIS — I69369 Other paralytic syndrome following cerebral infarction affecting unspecified side: Secondary | ICD-10-CM | POA: Insufficient documentation

## 2017-11-01 DIAGNOSIS — G610 Guillain-Barre syndrome: Secondary | ICD-10-CM

## 2017-11-01 DIAGNOSIS — Z8541 Personal history of malignant neoplasm of cervix uteri: Secondary | ICD-10-CM | POA: Insufficient documentation

## 2017-11-01 DIAGNOSIS — C189 Malignant neoplasm of colon, unspecified: Secondary | ICD-10-CM | POA: Diagnosis not present

## 2017-11-01 DIAGNOSIS — G629 Polyneuropathy, unspecified: Secondary | ICD-10-CM | POA: Insufficient documentation

## 2017-11-01 DIAGNOSIS — Z923 Personal history of irradiation: Secondary | ICD-10-CM | POA: Diagnosis not present

## 2017-11-01 DIAGNOSIS — R269 Unspecified abnormalities of gait and mobility: Secondary | ICD-10-CM | POA: Diagnosis present

## 2017-11-01 MED ORDER — GABAPENTIN 400 MG PO CAPS: 400.0000 mg | ORAL_CAPSULE | Freq: Three times a day (TID) | ORAL | 6 refills | Status: DC

## 2017-11-01 NOTE — Patient Instructions (Signed)
PLEASE FEEL FREE TO CALL OUR OFFICE WITH ANY PROBLEMS OR QUESTIONS ([phone])      

## 2017-11-01 NOTE — Progress Notes (Signed)
Subjective:    Patient ID: Cynthia [name], female    DOB: 1936-03-24, 82 y.o.   MRN: [id number]  HPI   Cynthia [name] is here in follow up of her GBS. She has had recurrent metastatic disease in her liver and just completed a course of radiation therapy last week.  She has tolerated it fairly well but does admit to feeling fatigued.  She has not been able to keep up with her home exercise program as much as a result.  Continues on gabapentin for her neuropathic pain.  She characterizes it more as a sensation now and not so much of pain.  She has cramping of her feet which remains an issue also.  Pain Inventory Average Pain 0 Pain Right Now 0 My pain is na  In the last 24 hours, has pain interfered with the following? General activity 0 Relation with others 0 Enjoyment of life 0 What TIME of day is your pain at its worst? na Sleep (in general) Good  Pain is worse with: na Pain improves with: na Relief from Meds: na  Mobility use a cane ability to climb steps?  yes do you drive?  yes  Function retired  Neuro/Psych numbness tingling  Prior Studies Any changes since last visit?  no  Physicians involved in your care Any changes since last visit?  no   Family History  Problem Relation Age of Onset  . Diabetes Sister   . Cancer Sister        leukemia  . Diabetes Brother   . Heart failure Brother   . Hypertension Brother   . Rheum arthritis Mother   . Heart failure Father   . Breast cancer Maternal Aunt   . Cancer Maternal Grandmother        GIST  . Heart failure Maternal Grandfather   . Aneurysm Paternal Grandmother    Social History   Socioeconomic History  . Marital status: Widowed    Spouse name: None  . Number of children: None  . Years of education: None  . Highest education level: None  Social Needs  . Financial resource strain: None  . Food insecurity - worry: None  . Food insecurity - inability: None  . Transportation needs - medical: None    . Transportation needs - non-medical: None  Occupational History  . None  Tobacco Use  . Smoking status: Never Smoker  . Smokeless tobacco: Never Used  Substance and Sexual Activity  . Alcohol use: No    Alcohol/week: 0.0 oz  . Drug use: No  . Sexual activity: No  Other Topics Concern  . None  Social History Narrative  . None   Past Surgical History:  Procedure Laterality Date  . ABDOMINAL HYSTERECTOMY     vaginal- partial  . BACK SURGERY  1981   lower lumbar  . ESOPHAGOGASTRODUODENOSCOPY N/A 10/14/2015   Procedure: ESOPHAGOGASTRODUODENOSCOPY (EGD);  Surgeon: Clarene Essex, MD;  Location: [facility] ENDOSCOPY;  Service: Endoscopy;  Laterality: N/A;  Bedside in ICU  . IR GENERIC HISTORICAL  09/07/2016   IR RADIOLOGIST EVAL & MGMT 09/07/2016 Aletta Edouard, MD [facility] INTERV RAD  . LAPAROSCOPIC RIGHT COLECTOMY Right 10/22/2013   Procedure: LAPAROSCOPIC RIGHT COLECTOMY ;  Surgeon: [name], MD;  Location: [facility] ORS;  Service: General;  Laterality: Right;  . SALPINGOOPHORECTOMY  10/22/2013   Procedure: BILATERALSALPINGO OOPHORECTOMY;  Surgeon: Imagene Gurney A. [name], MD;  Location: [facility] ORS;  Service: Gynecology;;  . [place]   Past Medical  History:  Diagnosis Date  . Anemia   . Blood transfusion without reported diagnosis jan 2015  . C. difficile colitis 03/05/14, 03/22/14, 04/05/14   recurrent  . Cancer ([place])    liver  . Cervical cancer ([place]) 1972  . Colon cancer ([place]) JAN 2015  . Colon cancer ([place])   . Complication of anesthesia    slow to awaken in past, DONE WELL RECENTLY  . DVT (deep venous thrombosis) ([place]) 10/24/13   Right leg  . Guillain-Barre ([place]) 09/2015  . Heart murmur   . Hypertension    no bp meds   . Liver cyst   . Stroke ([place]) 12/2014   NUMBNESS ON LEFT SIDE  . Tinnitus of both ears ALL THE TIME   There were no vitals taken for this visit.  Opioid Risk Score:   Fall Risk Score:  `1  Depression screen PHQ 2/9  Depression screen [facility] 2/9 09/21/2017 05/04/2016  12/23/2015  Decreased Interest 0 0 0  Down, Depressed, Hopeless 0 0 0  PHQ - 2 Score 0 0 0  Some recent data might be hidden     Review of Systems  Constitutional: Negative.   HENT: Negative.   Eyes: Negative.   Respiratory: Negative.   Cardiovascular: Negative.   Gastrointestinal: Negative.   Endocrine: Negative.   Genitourinary: Negative.   Musculoskeletal: Negative.   Skin: Negative.   Allergic/Immunologic: Negative.   Neurological: Positive for numbness.  Hematological: Negative.   Psychiatric/Behavioral: Negative.   All other systems reviewed and are negative.      Objective:   Physical Exam HEENT: Normocephalic. Atraumatic.  Cardio: RRR  Resp: CTA B/L  GI: BS positive and NT, ND  Skin: Intact. Warm and dry.  Neuro: Alert/Oriented  Motor 5/5 B/l UE now---B/l LE 4-/5 hip flexion, 4/5 knee extension, Right ADF 3/5, right APF 4, Left ADF   4/5 and left APF 4/5. Decreased LT and PP in stocking glove distribution bilateral arms/hand and legs/feet. Able to clear feet easily using walker. Clawfoot deformity (mild) Right foot Musc/Skel: No tenderness. No edema  Gen: NAD. Vital signs reviewed.  Psych: Mood, affect and behavior appropriate.   Assessment & Plan:  1. Quadriplegia, numbness, gait instability secondary to GBS. Has residual sensory loss and claw foot deformity--she has come to terms with the sensory symptoms and intermittent cramping. -continue HEP as tolerated -cane for balance  2 Pain Management: Tylenol prn. -gabapentin 400mg  tid with addnl dose at night as needed--I refilled this today with 6 months of further refills.  I am happy to keep seeing her but this is something that probably her primary physician can handle. 4. Neurogenic bladder-resolved  5. Colon cancer with recurrent mets: - per Dr. Benay Spice and radiation oncology team.   38minutes of face to face patient care time were spent during this visit. All questions were encouraged and  answered.

## 2017-11-06 ENCOUNTER — Ambulatory Visit: Payer: Self-pay | Admitting: Nurse Practitioner

## 2017-11-07 ENCOUNTER — Inpatient Hospital Stay: Payer: Medicare Other

## 2017-11-07 ENCOUNTER — Encounter: Payer: Self-pay | Admitting: Nurse Practitioner

## 2017-11-07 ENCOUNTER — Inpatient Hospital Stay: Payer: Medicare Other | Attending: Oncology | Admitting: Nurse Practitioner

## 2017-11-07 ENCOUNTER — Telehealth: Payer: Self-pay | Admitting: Oncology

## 2017-11-07 VITALS — BP 152/74 | HR 71 | Temp 97.7°F | Resp 18 | Ht 61.0 in | Wt 141.1 lb

## 2017-11-07 DIAGNOSIS — I82409 Acute embolism and thrombosis of unspecified deep veins of unspecified lower extremity: Secondary | ICD-10-CM | POA: Diagnosis not present

## 2017-11-07 DIAGNOSIS — C787 Secondary malignant neoplasm of liver and intrahepatic bile duct: Principal | ICD-10-CM

## 2017-11-07 DIAGNOSIS — C180 Malignant neoplasm of cecum: Secondary | ICD-10-CM | POA: Diagnosis not present

## 2017-11-07 DIAGNOSIS — C189 Malignant neoplasm of colon, unspecified: Secondary | ICD-10-CM

## 2017-11-07 DIAGNOSIS — R509 Fever, unspecified: Secondary | ICD-10-CM | POA: Insufficient documentation

## 2017-11-07 DIAGNOSIS — R197 Diarrhea, unspecified: Secondary | ICD-10-CM | POA: Insufficient documentation

## 2017-11-07 LAB — URINALYSIS, COMPLETE (UACMP) WITH MICROSCOPIC
BILIRUBIN URINE: NEGATIVE
GLUCOSE, UA: NEGATIVE mg/dL
Hgb urine dipstick: NEGATIVE
KETONES UR: NEGATIVE mg/dL
Nitrite: NEGATIVE
PROTEIN: NEGATIVE mg/dL
Specific Gravity, Urine: 1.02 (ref 1.005–1.030)
pH: 5 (ref 5.0–8.0)

## 2017-11-07 NOTE — Telephone Encounter (Signed)
Appointments scheduled AVS/Calendar printed per 3/26 los °

## 2017-11-07 NOTE — Progress Notes (Addendum)
[facility] OFFICE PROGRESS NOTE   Diagnosis: Colon cancer  INTERVAL HISTORY:   Cynthia [name] returns as scheduled.  She completed the course of SBRT to the liver on 10/27/2017.  She notes fatigue which she feels is related to the radiation.  No extremity weakness.  Several days ago she had a temperature of 101.2.  She also had diarrhea.  No shaking chills.  No urinary symptoms.  No shortness of breath or cough.  Overall good appetite though she does note an alteration in taste and smell.  Objective:  Vital signs in last 24 hours:  Blood pressure (!) 152/74, pulse 71, temperature 97.7 F (36.5 C), temperature source Oral, resp. rate 18, height 5' 1" (1.549 m), weight 141 lb 1.6 oz (64 kg), SpO2 100 %.    HEENT: No thrush or ulcers.  Resp: Lungs clear bilaterally. Cardio: Regular rate and rhythm. GI: Abdomen is soft.  Mild tenderness over the right upper quadrant.  No hepatomegaly. Vascular: No leg edema. Neuro: Alert and oriented.   Lab Results:  Lab Results  Component Value Date   WBC 5.3 01/29/2016   HGB 11.7 01/29/2016   HCT 37.2 01/29/2016   MCV 88.8 01/29/2016   PLT 243 01/29/2016   NEUTROABS 3 12/07/2015    Imaging:  No results found.  Medications: I have reviewed the patient's current medications.  Assessment/Plan: 1. Moderately differentiated adenocarcinoma of the cecum, stage III (T4a, N1), status post a laparoscopic assisted right colectomy 10/22/2013  The tumor returned microsatellite stable with normal mismatch repair protein expression   Cycle 1 adjuvant Xeloda 12/25/2013.   Cycle 2 adjuvant Xeloda 01/15/2014.   Cycle 3 adjuvant Xeloda 02/05/2014.   Cycle 4 adjuvant Xeloda 03/18/2014, discontinued after 4 days secondary to diarrhea   Cycle 5 adjuvant Xeloda 04/16/2014, Xeloda dose reduced to 1000 mg in the a.m. and 500 mg in the p.m.   Cycle 6 adjuvant Xeloda 05/07/2014.  Cycle 7 adjuvant Xeloda 05/28/2014.  Cycle 8  adjuvant Xeloda 06/18/2014.  Restaging CTs on 07/14/2014 with no evidence of metastatic colon cancer  Colonoscopy 10/02/2014 with diverticulosis in the sigmoid colon and in the descending colon. One 4 mm polyp in the sigmoid colon with complete resection. Polyp tissue not retrieved. Internal hemorrhoids. Next colonoscopy in 3 years for surveillance.  Restaging CTs 07/16/2015 with 2 new hypodense liver lesions concerning for metastases, no other evidence of disease progression  PET scan 07/28/2015 with a single hypermetabolic right liver lesion, no other evidence of metastatic disease  Ultrasound-guided biopsy of the right liver lesion 07/30/2015 confirmed metastatic colon cancer  MRI abdomen 08/20/2015 with similar to slight decrease in size of 2 hepatic metastasis. No new liver lesions or extrahepatic metastatic disease identified.  Radiofrequency ablation of 2 liver lesions 09/25/2015  MRI abdomen 02/02/2016-coagulative necrosis at the 2 ablated liver tumor sites,with no enhancement to suggest residual or recurrent liver tumor. No new liver masses or other findings of metastatic disease in the abdomen.  MRI abdomen 08/10/2016 revealed stable ablation sites, no evidence of progressive metastatic disease  Restaging CT 02/08/2017-stable treated liver lesions, no evidence of progressive metastatic disease, stable borderline enlarged right inguinal node  CEA with stable mild elevation 02/08/2017, 03/01/2017, 03/29/2017,04/19/2017  CEA further elevated 08/21/2017  CTs 09/05/2017-possible local recurrence of tumor along the ablation site right hepatic lobe lesion. Cephalad to the ablation site approximately 1.9 x 2.1cmsoft tissue mass progressive compared to the prior exam.  Liver SBRT 10/17/2017, 10/19/2017, 10/23/2017, 10/25/2017, 10/27/2017 2. Right ovary   cystadenofibroma, status post a bilateral oophorectomy 10/22/2013 3. Remote history of cervical cancer. 4. Iron deficiency  anemia-resolved. 5. Right calf deep vein thrombosis 10/24/2013-she completed 3 months of xarelto. 6. Indeterminate 12 mm hypermetabolic right pelvic density on the staging PET scan 10/04/2013-CT followup recommended per the GI tumor conference 11/13/2013. No longer visualized on a CT 07/14/2014. 7. Soft tissue implant overlying the posterior right liver on the CT 86/76/7209-OBS hypermetabolic on the PET scan 96/28/3662. 8. C. difficile colitis 03/05/2014. She completed a course of Flagyl, recurrent diarrhea beginning 03/21/2014, positive C. difficile toxin 03/26/2014-resumed Flagyl for planned 10 day course.  Vancomycin beginning 04/08/2014.  Taper complete 05/21/2014.  9. CVA May 2016 10. Skin infection at the right first MTP joint-Keflex prescribed 08/06/2015 11. Guillan-Barre syndrome February 2017 12. Left lower extremity DVTs March 2017-treated with Xarelto     Disposition: [name] appears stable.  She completed the course of SBRT to the liver 10/27/2017.  The plan is to repeat a CEA at approximately 8 weeks from completion of radiation and restaging CT at approximately 4 months from completion of radiation.  She had a fever and diarrhea several days ago.  She will check a urinalysis today.  She will contact the office with recurrent fever.  We will see her in follow-up in 8 weeks with a CEA.  She will contact the office in the interim as outlined above or with any other problems.  Patient seen with Dr. Benay Spice.    Ned Card ANP/GNP-BC   11/07/2017  10:53 AM  This was a shared visit with Ned Card.  Cynthia [name] is recovering from the SBRT treatment.  I suspect the recent fever and diarrhea are related to a viral illness versus toxicity from radiation.  We will check a CEA when she returns in 8 weeks.  Julieanne Manson, MD

## 2017-11-08 LAB — URINE CULTURE: Culture: NO GROWTH

## 2017-11-08 NOTE — Progress Notes (Signed)
  Radiation Oncology         [phone] ________________________________  Name: Cynthia [name] MRN: [id number]  Date: 10/27/2017  DOB: 10-Nov-1935  End of Treatment Note  Diagnosis:   82 y.o. female with Progressive Stage III, T41N1 adenocarcinoma of the right colon with liver metastases    Indication for treatment::  curative       Radiation treatment dates:   10/17/2017, 10/19/2017, 10/23/2017, 10/25/2017, 10/27/2017  Site/dose:   Liver / 50 Gy in 5 fractions  Beams/energy:   SBRT/SRT-3D // 6X-FFF Photon  Narrative: The patient tolerated radiation treatment relatively well.   She reported some tenderness and had mild erythema in the treated area without desquamation. She otherwise did not have any signs of acute toxicity during treatment.   Plan: The patient has completed radiation treatment. The patient will return to radiation oncology clinic for routine followup in one month. I advised the patient to call or return sooner if they have any questions or concerns related to their recovery or treatment. ________________________________  Jodelle Gross, MD, PhD  This document serves as a record of services personally performed by Kyung Rudd, MD. It was created on his behalf by Rae Lips, a trained medical scribe. The creation of this record is based on the scribe's personal observations and the provider's statements to them. This document has been checked and approved by the attending provider.

## 2017-11-09 ENCOUNTER — Telehealth: Payer: Self-pay | Admitting: Emergency Medicine

## 2017-11-09 NOTE — Telephone Encounter (Addendum)
VM left to call back regarding this note.   ----- Message from Owens Shark, NP sent at 11/08/2017  3:41 PM EDT ----- Please let her know the urine culture is negative.

## 2017-11-09 NOTE — Telephone Encounter (Addendum)
Patient verbalized understanding of this note. Culture results also given to patient.  ----- Message from Owens Shark, NP sent at 11/07/2017  1:40 PM EDT ----- Please let her know the urinalysis was fairly unremarkable.  We will follow-up on the urine culture.

## 2017-11-27 ENCOUNTER — Ambulatory Visit
Admission: RE | Admit: 2017-11-27 | Discharge: 2017-11-27 | Disposition: A | Discharge status: Home / Self Care | Payer: Medicare Other | Source: Ambulatory Visit | Attending: Radiation Oncology | Admitting: Radiation Oncology

## 2017-11-27 ENCOUNTER — Other Ambulatory Visit: Payer: Self-pay

## 2017-11-27 ENCOUNTER — Encounter: Payer: Self-pay | Admitting: Radiation Oncology

## 2017-11-27 VITALS — BP 141/81 | HR 73 | Temp 98.2°F | Resp 20 | Ht 61.0 in | Wt 141.6 lb

## 2017-11-27 DIAGNOSIS — Z923 Personal history of irradiation: Secondary | ICD-10-CM | POA: Insufficient documentation

## 2017-11-27 DIAGNOSIS — Z79899 Other long term (current) drug therapy: Secondary | ICD-10-CM | POA: Insufficient documentation

## 2017-11-27 DIAGNOSIS — C182 Malignant neoplasm of ascending colon: Secondary | ICD-10-CM | POA: Diagnosis present

## 2017-11-27 DIAGNOSIS — C787 Secondary malignant neoplasm of liver and intrahepatic bile duct: Secondary | ICD-10-CM | POA: Diagnosis not present

## 2017-11-27 DIAGNOSIS — G610 Guillain-Barre syndrome: Secondary | ICD-10-CM | POA: Diagnosis not present

## 2017-11-27 DIAGNOSIS — C189 Malignant neoplasm of colon, unspecified: Secondary | ICD-10-CM

## 2017-11-29 NOTE — Progress Notes (Signed)
  Radiation Oncology         [phone] ________________________________  Name: RAFIA SHEDDEN MRN: [id number]  Date of Service: 11/27/2017 DOB: 01-02-1936  Post Treatment Note  CC: Jonathon Jordan, MD  Ladell Pier, MD  Diagnosis:   Progressive Stage III, T4N1 adenocaricnoma of the right colon with liver metastases.  Interval Since Last Radiation: 5 weeks   10/17/17-10/27/17 SBRT Treatment: 50 Gy in 5 fractions  Narrative:  The patient returns today for routine follow-up. She did not undergo fiducial marker placement due to concerns for reactivation of her Guillain Barre. She proceeded with SBRT and comes today for follow up. She reports she's done well but did have a low grade fever on one occasion about 2 week ago. She denies any persistent or progressive changes of infectious symptoms. No other complaints are noted.                           ALLERGIES:  is allergic to morphine and related.  Meds: Current Outpatient Medications  Medication Sig Dispense Refill  . acetaminophen (TYLENOL) 500 MG tablet Take 500 mg by mouth every 6 (six) hours as needed for mild pain.    Marland Kitchen atorvastatin (LIPITOR) 40 MG tablet Take 1 tablet (40 mg total) by mouth daily. 90 tablet 0  . Cholecalciferol (VITAMIN D) 2000 units tablet Take 2,000 Units by mouth daily.    Marland Kitchen gabapentin (NEURONTIN) 400 MG capsule Take 1 capsule (400 mg total) by mouth 3 (three) times daily. May take additional dose at night PRN 100 capsule 6  . metoprolol succinate (TOPROL-XL) 25 MG 24 hr tablet Take 37.5 mg by mouth every evening.     . Multiple Vitamin (MULTIVITAMIN WITH MINERALS) TABS tablet Take 1 tablet by mouth every morning.      No current facility-administered medications for this encounter.     Physical Findings:  height is 5\' 1"  (1.549 m) and weight is 141 lb 9.6 oz (64.2 kg). Her oral temperature is 98.2 F (36.8 C). Her blood pressure is 141/81 (abnormal) and her pulse is 73. Her respiration is 20 and  oxygen saturation is 96%.  Pain Assessment Pain Score: 0-No pain/10 In general this is a well appearing caucasian female in no acute distress. She's alert and oriented x4 and appropriate throughout the examination. Cardiopulmonary assessment is negative for acute distress and she exhibits normal effort. Her abdomen is soft, nontender, nondistended.  Lab Findings: Lab Results  Component Value Date   WBC 5.3 01/29/2016   HGB 11.7 01/29/2016   HCT 37.2 01/29/2016   MCV 88.8 01/29/2016   PLT 243 01/29/2016     Radiographic Findings: No results found.  Impression/Plan: 1. Progressive Stage III, T4N1 adenocaricnoma of the right colon with liver metastases. The patient is doing well since her completion of radiotherapy. We will follow up with her peripherally and see her back as needed, as she will be seen by Dr. Benay Spice on 12/21/17.      Carola Rhine, PAC

## 2017-12-21 ENCOUNTER — Inpatient Hospital Stay: Payer: Medicare Other | Attending: Oncology

## 2017-12-21 ENCOUNTER — Inpatient Hospital Stay (HOSPITAL_BASED_OUTPATIENT_CLINIC_OR_DEPARTMENT_OTHER): Payer: Medicare Other | Admitting: Oncology

## 2017-12-21 ENCOUNTER — Telehealth: Payer: Self-pay | Admitting: Oncology

## 2017-12-21 VITALS — BP 141/72 | HR 64 | Temp 98.2°F | Resp 17 | Ht 61.0 in | Wt 140.7 lb

## 2017-12-21 DIAGNOSIS — C180 Malignant neoplasm of cecum: Secondary | ICD-10-CM | POA: Insufficient documentation

## 2017-12-21 DIAGNOSIS — C787 Secondary malignant neoplasm of liver and intrahepatic bile duct: Secondary | ICD-10-CM | POA: Diagnosis not present

## 2017-12-21 DIAGNOSIS — R202 Paresthesia of skin: Secondary | ICD-10-CM | POA: Diagnosis not present

## 2017-12-21 DIAGNOSIS — C189 Malignant neoplasm of colon, unspecified: Secondary | ICD-10-CM

## 2017-12-21 DIAGNOSIS — Z86718 Personal history of other venous thrombosis and embolism: Secondary | ICD-10-CM | POA: Insufficient documentation

## 2017-12-21 DIAGNOSIS — I82409 Acute embolism and thrombosis of unspecified deep veins of unspecified lower extremity: Secondary | ICD-10-CM

## 2017-12-21 LAB — CEA (IN HOUSE-CHCC): CEA (CHCC-In House): 6.35 ng/mL — ABNORMAL HIGH (ref 0.00–5.00)

## 2017-12-21 NOTE — Telephone Encounter (Signed)
Scheduled appt per 5/9 los - Gave patient AVS And calender per los.

## 2017-12-21 NOTE — Progress Notes (Signed)
[place] OFFICE PROGRESS NOTE   Diagnosis: Colon cancer INTERVAL HISTORY:   Cynthia [name] returns for a scheduled visit.  She feels well.  Good appetite.  No diarrhea.  She has a "stitch "in the right abdomen intermittently.  She takes Tylenol for relief.  She is active at home.  She continues to have tingling in the legs.  Objective:  Vital signs in last 24 hours:  Blood pressure (!) 141/72, pulse 64, temperature 98.2 F (36.8 C), temperature source Oral, resp. rate 17, height 5' 1"  (1.549 m), weight 140 lb 11.2 oz (63.8 kg), SpO2 100 %.   Resp: Lungs clear bilaterally Cardio: Regular rate and rhythm GI: No hepatomegaly, no mass, nontender Vascular: Leg edema   Lab Results:   Lab Results  Component Value Date   CEA1 17.91 (H) 08/21/2017     Medications: I have reviewed the patient's current medications.   Assessment/Plan:  1. Moderately differentiated adenocarcinoma of the cecum, stage III (T4a, N1), status post a laparoscopic assisted right colectomy 10/22/2013  The tumor returned microsatellite stable with normal mismatch repair protein expression   Cycle 1 adjuvant Xeloda 12/25/2013.   Cycle 2 adjuvant Xeloda 01/15/2014.   Cycle 3 adjuvant Xeloda 02/05/2014.   Cycle 4 adjuvant Xeloda 03/18/2014, discontinued after 4 days secondary to diarrhea   Cycle 5 adjuvant Xeloda 04/16/2014, Xeloda dose reduced to 1000 mg in the a.m. and 500 mg in the p.m.   Cycle 6 adjuvant Xeloda 05/07/2014.  Cycle 7 adjuvant Xeloda 05/28/2014.  Cycle 8 adjuvant Xeloda 06/18/2014.  Restaging CTs on 07/14/2014 with no evidence of metastatic colon cancer  Colonoscopy 10/02/2014 with diverticulosis in the sigmoid colon and in the descending colon. One 4 mm polyp in the sigmoid colon with complete resection. Polyp tissue not retrieved. Internal hemorrhoids. Next colonoscopy in 3 years for surveillance.  Restaging CTs 07/16/2015 with 2 new hypodense liver  lesions concerning for metastases, no other evidence of disease progression  PET scan 07/28/2015 with a single hypermetabolic right liver lesion, no other evidence of metastatic disease  Ultrasound-guided biopsy of the right liver lesion 07/30/2015 confirmed metastatic colon cancer  MRI abdomen 08/20/2015 with similar to slight decrease in size of 2 hepatic metastasis. No new liver lesions or extrahepatic metastatic disease identified.  Radiofrequency ablation of 2 liver lesions 09/25/2015  MRI abdomen 02/02/2016-coagulative necrosis at the 2 ablated liver tumor sites,with no enhancement to suggest residual or recurrent liver tumor. No new liver masses or other findings of metastatic disease in the abdomen.  MRI abdomen 08/10/2016 revealed stable ablation sites, no evidence of progressive metastatic disease  Restaging CT 02/08/2017-stable treated liver lesions, no evidence of progressive metastatic disease, stable borderline enlarged right inguinal node  CEA with stable mild elevation 02/08/2017, 03/01/2017, 03/29/2017,04/19/2017  CEA further elevated 08/21/2017  CTs 09/05/2017-possible local recurrence of tumor along the ablation site right hepatic lobe lesion. Cephalad to the ablation site approximately 1.9 x 2.1cmsoft tissue mass progressive compared to the prior exam.  Liver SBRT 10/17/2017, 10/19/2017, 10/23/2017, 10/25/2017, 10/27/2017 2. Right ovary cystadenofibroma, status post a bilateral oophorectomy 10/22/2013 3. Remote history of cervical cancer. 4. Iron deficiency anemia-resolved. 5. Right calf deep vein thrombosis 10/24/2013-she completed 3 months of xarelto. 6. Indeterminate 12 mm hypermetabolic right pelvic density on the staging PET scan 10/04/2013-CT followup recommended per the GI tumor conference 11/13/2013. No longer visualized on a CT 07/14/2014. 7. Soft tissue implant overlying the posterior right liver on the CT 84/66/5993-TTS hypermetabolic on the PET scan  10/04/2013. 8. C. difficile colitis 03/05/2014. She completed a course of Flagyl, recurrent diarrhea beginning 03/21/2014, positive C. difficile toxin 03/26/2014-resumed Flagyl for planned 10 day course.  Vancomycin beginning 04/08/2014.  Taper complete 05/21/2014.  9. CVA May 2016 10. Skin infection at the right first MTP joint-Keflex prescribed 08/06/2015 11. Guillan-Barre syndrome February 2017 12. Left lower extremity DVTs March 2017-treated with Xarelto   Disposition: Cynthia [name] appears well.  We will follow-up on the CEA from today.  She will return for an office visit and restaging CT in 2 months.  15 minutes were spent with the patient today.  The majority of the time was used for counseling and coordination of care.  [name], MD  12/21/2017  11:47 AM

## 2017-12-22 ENCOUNTER — Telehealth: Payer: Self-pay

## 2017-12-22 NOTE — Telephone Encounter (Addendum)
Spoke with pt in regards to lab results below. Pt verbalized understanding.  ----- Message from Ladell Pier, MD sent at 12/21/2017  5:16 PM EDT ----- Please call patient, the CEA is lower, follow-up as scheduled

## 2017-12-22 NOTE — Telephone Encounter (Addendum)
Left VM msg in regards to note below. Advised pt to please call back.  ----- Message from Ladell Pier, MD sent at 12/21/2017  5:16 PM EDT ----- Please call patient, the CEA is lower, follow-up as scheduled

## 2017-12-27 NOTE — Progress Notes (Signed)
  Radiation Oncology         [phone] ________________________________  Name: Cynthia [name] MRN: [id number]  Date: 10/10/2017  DOB: 10/07/1935  RESPIRATORY MOTION MANAGEMENT SIMULATION  NARRATIVE:  In order to account for effect of respiratory motion on target structures and other organs in the planning and delivery of radiotherapy, this patient underwent respiratory motion management simulation.  To accomplish this, when the patient was brought to the CT simulation planning suite, 4D respiratoy motion management CT images were obtained.  The CT images were loaded into the planning software.  Then, using a variety of tools including Cine, MIP, and standard views, the target volume and planning target volumes (PTV) were delineated.  Avoidance structures were contoured.  Treatment planning then occurred.  Dose volume histograms were generated and reviewed for each of the requested structure.  The resulting plan was carefully reviewed and approved today.   ------------------------------------------------  Jodelle Gross, MD, PhD

## 2017-12-27 NOTE — Progress Notes (Signed)
[facility] Simulation and Treatment Planning Note   Name:  Cynthia [name] MRN: [id number]   Date: 12/27/2017  DOB: [birth year]  Status:outpatient    DIAGNOSIS:    ICD-10-CM   1. Metastasis to liver (HCC) C78.7      TREATMENT SITE:  liver   CONSENT VERIFIED:yes   SET UP: Patient is setup supine   IMMOBILIZATION: The patient was immobilized using a customized Vac Loc bag/ blue bag and customized accuform device   NARRATIVE:The patient was brought to the [facility].  Identity was confirmed.  All relevant records and images related to the planned course of therapy were reviewed.  Then, the patient was positioned in a stable reproducible clinical set-up for radiation therapy. Abdominal compression was applied by me.  4D CT images were obtained and reproducible breathing pattern was confirmed. Free breathing CT images were obtained.  Skin markings were placed.  The CT images were loaded into the planning software where the target and avoidance structures were contoured.  The radiation prescription was entered and confirmed.    TREATMENT PLANNING NOTE:  Treatment planning then occurred. I have requested : IMRT planning.This treatment technique is medically necessary due to the high-dose of radiation delivered to the target region which is in close proximity to adjacent critical normal structures.  3 dimensional simulation is performed and dose volume histogram of the gross tumor volume, planning tumor volume and criticial normal structures including the spinal cord and lungs were analyzed and requested.  Special treatment procedure was performed due to high dose per fraction.  The patient will be monitored for increased risk of toxicity.  Daily imaging using cone beam CT will be used for target localization.  I anticipate that the patient will receive 50 Gy in 5 fractions to target volume. Further adjustments will be made based  on the planning process is necessary.  ------------------------------------------------  Jodelle Gross, MD, PhD

## 2018-02-19 ENCOUNTER — Encounter (HOSPITAL_COMMUNITY): Payer: Self-pay

## 2018-02-19 ENCOUNTER — Ambulatory Visit (HOSPITAL_COMMUNITY)
Admission: RE | Admit: 2018-02-19 | Discharge: 2018-02-19 | Disposition: A | Discharge status: Home / Self Care | Payer: Medicare Other | Source: Ambulatory Visit | Attending: Oncology | Admitting: Oncology

## 2018-02-19 ENCOUNTER — Other Ambulatory Visit: Payer: Self-pay

## 2018-02-19 ENCOUNTER — Telehealth: Payer: Self-pay | Admitting: *Deleted

## 2018-02-19 ENCOUNTER — Inpatient Hospital Stay: Payer: Medicare Other | Attending: Oncology

## 2018-02-19 DIAGNOSIS — C787 Secondary malignant neoplasm of liver and intrahepatic bile duct: Secondary | ICD-10-CM | POA: Diagnosis not present

## 2018-02-19 DIAGNOSIS — C189 Malignant neoplasm of colon, unspecified: Secondary | ICD-10-CM

## 2018-02-19 DIAGNOSIS — C180 Malignant neoplasm of cecum: Secondary | ICD-10-CM | POA: Insufficient documentation

## 2018-02-19 DIAGNOSIS — I82409 Acute embolism and thrombosis of unspecified deep veins of unspecified lower extremity: Secondary | ICD-10-CM | POA: Diagnosis not present

## 2018-02-19 LAB — BASIC METABOLIC PANEL - CANCER CENTER ONLY
ANION GAP: 6 (ref 5–15)
BUN: 12 mg/dL (ref 8–23)
CHLORIDE: 106 mmol/L (ref 98–111)
CO2: 31 mmol/L (ref 22–32)
Calcium: 9.5 mg/dL (ref 8.9–10.3)
Creatinine: 0.89 mg/dL (ref 0.44–1.00)
GFR, Est AFR Am: >60 mL/min (ref >60)
Glucose, Bld: 77 mg/dL (ref 70–99)
POTASSIUM: 4.4 mmol/L (ref 3.5–5.1)
Sodium: 143 mmol/L (ref 135–145)

## 2018-02-19 LAB — CEA (IN HOUSE-CHCC): CEA (CHCC-In House): 3.72 ng/mL (ref 0.00–5.00)

## 2018-02-19 MED ORDER — IOPAMIDOL (ISOVUE-300) INJECTION 61%
100.0000 mL | Freq: Once | INTRAVENOUS | Status: AC | PRN
  Administered 2018-02-19: 100 mL via INTRAVENOUS

## 2018-02-19 MED ORDER — IOPAMIDOL (ISOVUE-300) INJECTION 61%
INTRAVENOUS | Status: AC
  Filled 2018-02-19: qty 100

## 2018-02-19 NOTE — Telephone Encounter (Signed)
Returned call per [facility] Telephone Record.  Message left confirming today's appointments.  Requested arrival to [facility] at 11:00 am for registration process for 11:30 am lab.  [facility] registration at 12:15 for 12:30 CT A/P scan.  Drink contrast two hrs or 10:30 am and one hr  Or 11:30 am before scan.  Request return call to office for any questions.

## 2018-02-20 ENCOUNTER — Inpatient Hospital Stay: Payer: Medicare Other | Admitting: Oncology

## 2018-02-20 ENCOUNTER — Telehealth: Payer: Self-pay | Admitting: Oncology

## 2018-02-20 ENCOUNTER — Telehealth: Payer: Self-pay | Admitting: *Deleted

## 2018-02-20 VITALS — BP 151/92 | HR 69 | Temp 98.2°F | Resp 17 | Ht 61.0 in | Wt 141.5 lb

## 2018-02-20 DIAGNOSIS — C180 Malignant neoplasm of cecum: Secondary | ICD-10-CM | POA: Diagnosis not present

## 2018-02-20 DIAGNOSIS — C189 Malignant neoplasm of colon, unspecified: Secondary | ICD-10-CM

## 2018-02-20 DIAGNOSIS — C787 Secondary malignant neoplasm of liver and intrahepatic bile duct: Secondary | ICD-10-CM

## 2018-02-20 DIAGNOSIS — I82409 Acute embolism and thrombosis of unspecified deep veins of unspecified lower extremity: Secondary | ICD-10-CM | POA: Diagnosis not present

## 2018-02-20 NOTE — Telephone Encounter (Signed)
Appointments scheduled AVS/Calendar printed per 7/9 los °

## 2018-02-20 NOTE — Progress Notes (Signed)
[place] OFFICE PROGRESS NOTE   Diagnosis: Colon cancer  INTERVAL HISTORY:   [name] returns as scheduled.  She had an episode of abdominal pain after eating a hot dog approximately 1 month ago.  The pain resolved.  She has early satiety.  No nausea/vomiting.  She otherwise feels well.  Objective:  Vital signs in last 24 hours:  Blood pressure (!) 151/92, pulse 69, temperature 98.2 F (36.8 C), temperature source Oral, resp. rate 17, height _0  (1.549 m), weight 141 lb 8 oz (64.2 kg), SpO2 99 %.    HEENT: Neck without mass Lymphatics: No cervical, supraclavicular, axillary, or inguinal nodes Resp: Lungs clear bilaterally Cardio: Regular rate and rhythm GI: No hepatosplenomegaly no mass, nontender Vascular: No leg edema   Lab Results:   CMP  Lab Results  Component Value Date   NA 143 02/19/2018   K 4.4 02/19/2018   CL 106 02/19/2018   CO2 31 02/19/2018   GLUCOSE 77 02/19/2018   BUN 12 02/19/2018   CREATININE 0.89 02/19/2018   CALCIUM 9.5 02/19/2018   PROT 7.3 09/05/2017   ALBUMIN 3.9 09/05/2017   AST 27 09/05/2017   ALT 19 09/05/2017   ALKPHOS 56 09/05/2017   BILITOT 0.5 09/05/2017   GFRNONAA >60 02/19/2018   GFRAA >60 02/19/2018    Lab Results  Component Value Date   CEA1 3.72 02/19/2018    Imaging:  Ct Abdomen Pelvis W Contrast  Result Date: 02/19/2018 CLINICAL DATA:  Restaging metastatic colon cancer. Ongoing oral chemotherapy. History of prior ablation procedures. EXAM: CT ABDOMEN AND PELVIS WITH CONTRAST TECHNIQUE: Multidetector CT imaging of the abdomen and pelvis was performed using the standard protocol following bolus administration of intravenous contrast. CONTRAST:  158m ISOVUE-300 IOPAMIDOL (ISOVUE-300) INJECTION 61% COMPARISON:  MRI 09/28/2017 and CT scan 09/05/2017. FINDINGS: Lower chest: The lung bases are clear of an acute process. Minimal basilar scarring changes. The heart is normal in size. No pericardial effusion.  Stable probable pericardial cyst on the right side. Hepatobiliary: Stable area of low attenuation in the segment 8 ablation site. No findings suspicious for recurrent tumor. The low attenuation area of ablation involving segment 6 of the liver appears relatively stable in size but there is progressive surrounding ill-defined areas of low-attenuation around the lesion and moderate enhancement in this area on the delayed images. This whole area measures approximately 5.5 x 3.9 cm on series 7, image 13 and previously measured approximately 4.8 x 3.3 cm. Findings worrisome for progressive recurrent tumor. There is also a small extracapsular lesion posteriorly which measures 1.6 x 1.3 cm. On the prior CT scan this measured 12 x 7 mm. No new hepatic metastatic lesions are identified. Stable large hepatic cysts. The portal and hepatic veins are patent. Pancreas: No mass, inflammation or ductal dilatation. Spleen: Normal size.  No focal lesions. Adrenals/Urinary Tract: The adrenal glands and kidneys are unremarkable and stable. Bladder appears normal. Stomach/Bowel: The stomach, duodenum, small bowel and colon are unremarkable except for the distal ileum which demonstrates fairly marked wall thickening and submucosal edema along with mild perienteric inflammatory/interstitial changes. Findings could be due to Crohn's disease or other inflammatory or infectious ileitis. Vascular/Lymphatic: Stable advanced atherosclerotic calcifications involving the aorta and iliac arteries. No aneurysm or dissection. The branch vessels are patent. The major venous structures are patent. Stable scattered small mesenteric and retroperitoneal lymph nodes Reproductive: Surgically absent. Other: No pelvic mass or free pelvic fluid collections. No inguinal mass or adenopathy. Musculoskeletal: Stable advanced degenerative  changes involving the hips and spine. No worrisome bone lesions. IMPRESSION: 1. Findings demonstrate progressive enhancing  tumor surrounding the ablation site in segment 6 of the liver. 2. Normal in stable appearance of the segment 8 ablation site. No findings for recurrent tumor. 3. No new hepatic metastatic lesions. 4. Numerous small scattered mesenteric and retroperitoneal lymph nodes are stable. No progressive findings. 5. Inflammatory or infectious process involving the distal ileum. Electronically Signed   By: Marijo Sanes M.D.   On: 02/19/2018 15:18    Medications: I have reviewed the patient's current medications.   Assessment/Plan: 1. Moderately differentiated adenocarcinoma of the cecum, stage III (T4a, N1), status post a laparoscopic assisted right colectomy 10/22/2013  The tumor returned microsatellite stable with normal mismatch repair protein expression   Cycle 1 adjuvant Xeloda 12/25/2013.   Cycle 2 adjuvant Xeloda 01/15/2014.   Cycle 3 adjuvant Xeloda 02/05/2014.   Cycle 4 adjuvant Xeloda 03/18/2014, discontinued after 4 days secondary to diarrhea   Cycle 5 adjuvant Xeloda 04/16/2014, Xeloda dose reduced to 1000 mg in the a.m. and 500 mg in the p.m.   Cycle 6 adjuvant Xeloda 05/07/2014.  Cycle 7 adjuvant Xeloda 05/28/2014.  Cycle 8 adjuvant Xeloda 06/18/2014.  Restaging CTs on 07/14/2014 with no evidence of metastatic colon cancer  Colonoscopy 10/02/2014 with diverticulosis in the sigmoid colon and in the descending colon. One 4 mm polyp in the sigmoid colon with complete resection. Polyp tissue not retrieved. Internal hemorrhoids. Next colonoscopy in 3 years for surveillance.  Restaging CTs 07/16/2015 with 2 new hypodense liver lesions concerning for metastases, no other evidence of disease progression  PET scan 07/28/2015 with a single hypermetabolic right liver lesion, no other evidence of metastatic disease  Ultrasound-guided biopsy of the right liver lesion 07/30/2015 confirmed metastatic colon cancer  MRI abdomen 08/20/2015 with similar to slight decrease in size of 2  hepatic metastasis. No new liver lesions or extrahepatic metastatic disease identified.  Radiofrequency ablation of 2 liver lesions 09/25/2015  MRI abdomen 02/02/2016-coagulative necrosis at the 2 ablated liver tumor sites,with no enhancement to suggest residual or recurrent liver tumor. No new liver masses or other findings of metastatic disease in the abdomen.  MRI abdomen 08/10/2016 revealed stable ablation sites, no evidence of progressive metastatic disease  Restaging CT 02/08/2017-stable treated liver lesions, no evidence of progressive metastatic disease, stable borderline enlarged right inguinal node  CEA with stable mild elevation 02/08/2017, 03/01/2017, 03/29/2017,04/19/2017  CEA further elevated 08/21/2017  CTs 09/05/2017-possible local recurrence of tumor along the ablation site right hepatic lobe lesion. Cephalad to the ablation site approximately 1.9 x 2.1cmsoft tissue mass progressive compared to the prior exam.  Liver SBRT 10/17/2017, 10/19/2017, 10/23/2017, 10/25/2017, 10/27/2017  CT 02/19/2018- progressive ill-defined areas of low attenuation with enhancement surrounding the segment 6 ablation site, stable segment 8 ablation site enlargement of extracapsular lesion posteriorly, no new hepatic lesions, wall thickening/edema at the distal ileum 2. Right ovary cystadenofibroma, status post a bilateral oophorectomy 10/22/2013 3. Remote history of cervical cancer. 4. Iron deficiency anemia-resolved. 5. Right calf deep vein thrombosis 10/24/2013-she completed 3 months of xarelto. 6. Indeterminate 12 mm hypermetabolic right pelvic density on the staging PET scan 10/04/2013-CT followup recommended per the GI tumor conference 11/13/2013. No longer visualized on a CT 07/14/2014. 7. Soft tissue implant overlying the posterior right liver on the CT 27/74/1287-[facility] hypermetabolic on the PET scan 67/20/9470. 8. C. difficile colitis 03/05/2014. She completed a course of Flagyl, recurrent diarrhea  beginning 03/21/2014, positive C. difficile toxin 03/26/2014-resumed Flagyl  for planned 10 day course.  Vancomycin beginning 04/08/2014.  Taper complete 05/21/2014.  9. CVA May 2016 10. Skin infection at the right first MTP joint-Keflex prescribed 08/06/2015 11. Guillan-Barre syndrome February 2017 12. Left lower extremity DVTs March 2017-treated with Xarelto   Disposition: Ms. Mcilvain appears stable.  The CEA is normal.  I reviewed the restaging CTs with her.  It is unclear whether the increased area of enhancement surrounding the right liver ablation site is related to tumor or an effect from the stereotactic radiation.  I will present her case at the GI tumor conference on 02/28/2018.  She will contact us for increased gastrointestinal symptoms.  She will return for an office visit and CEA in 2 months.  25 minutes were spent with the patient today.  The majority of the time was used for counseling and coordination of care.  [name], MD  02/20/2018  12:51 PM

## 2018-02-20 NOTE — Telephone Encounter (Signed)
Telephone call to patient per Dr. Benay Spice to discuss some thickening in the small bowel. Patient reports the contrast always give her diarrhea. She will monitor for continued diarrhea and abd pain. She understands to notify us with any concerns or questions.

## 2018-04-24 ENCOUNTER — Inpatient Hospital Stay: Payer: Medicare Other | Admitting: Nurse Practitioner

## 2018-04-24 ENCOUNTER — Encounter: Payer: Self-pay | Admitting: Nurse Practitioner

## 2018-04-24 ENCOUNTER — Inpatient Hospital Stay: Payer: Medicare Other | Attending: Oncology

## 2018-04-24 ENCOUNTER — Telehealth: Payer: Self-pay

## 2018-04-24 VITALS — BP 169/84 | HR 58 | Temp 97.9°F | Resp 17 | Ht 61.0 in | Wt 144.7 lb

## 2018-04-24 DIAGNOSIS — I82409 Acute embolism and thrombosis of unspecified deep veins of unspecified lower extremity: Secondary | ICD-10-CM

## 2018-04-24 DIAGNOSIS — C180 Malignant neoplasm of cecum: Secondary | ICD-10-CM | POA: Insufficient documentation

## 2018-04-24 DIAGNOSIS — C787 Secondary malignant neoplasm of liver and intrahepatic bile duct: Secondary | ICD-10-CM

## 2018-04-24 DIAGNOSIS — C189 Malignant neoplasm of colon, unspecified: Secondary | ICD-10-CM

## 2018-04-24 LAB — CEA (IN HOUSE-CHCC): CEA (CHCC-In House): 2.99 ng/mL (ref 0.00–5.00)

## 2018-04-24 NOTE — Progress Notes (Signed)
[place] OFFICE PROGRESS NOTE   Diagnosis: Colon cancer  INTERVAL HISTORY:   [name] returns as scheduled.  She feels well.  No interim illnesses or infections.  No abdominal pain.  No change in bowel habits.  She denies bleeding.  No nausea or vomiting.  Objective:  Vital signs in last 24 hours:  Blood pressure (!) 169/84, pulse (!) 58, temperature 97.9 F (36.6 C), temperature source Oral, resp. rate 17, height _0  (1.549 m), weight 144 lb 11.2 oz (65.6 kg), SpO2 97 %.    HEENT: No thrush or ulcers. Lymphatics: No palpable cervical, supraclavicular or axillary lymph nodes. Resp: Lungs clear bilaterally. Cardio: Regular rate and rhythm. GI: Abdomen soft and nontender.  No hepatomegaly.  No mass. Vascular: Trace bilateral pretibial edema.  Calves soft and nontender.   Lab Results:  Lab Results  Component Value Date   WBC 5.3 01/29/2016   HGB 11.7 01/29/2016   HCT 37.2 01/29/2016   MCV 88.8 01/29/2016   PLT 243 01/29/2016   NEUTROABS 3 12/07/2015    Imaging:  No results found.  Medications: I have reviewed the patient's current medications.  Assessment/Plan: 1. Moderately differentiated adenocarcinoma of the cecum, stage III (T4a, N1), status post a laparoscopic assisted right colectomy 10/22/2013  The tumor returned microsatellite stable with normal mismatch repair protein expression   Cycle 1 adjuvant Xeloda 12/25/2013.   Cycle 2 adjuvant Xeloda 01/15/2014.   Cycle 3 adjuvant Xeloda 02/05/2014.   Cycle 4 adjuvant Xeloda 03/18/2014, discontinued after 4 days secondary to diarrhea   Cycle 5 adjuvant Xeloda 04/16/2014, Xeloda dose reduced to 1000 mg in the a.m. and 500 mg in the p.m.   Cycle 6 adjuvant Xeloda 05/07/2014.  Cycle 7 adjuvant Xeloda 05/28/2014.  Cycle 8 adjuvant Xeloda 06/18/2014.  Restaging CTs on 07/14/2014 with no evidence of metastatic colon cancer  Colonoscopy 10/02/2014 with diverticulosis in the  sigmoid colon and in the descending colon. One 4 mm polyp in the sigmoid colon with complete resection. Polyp tissue not retrieved. Internal hemorrhoids. Next colonoscopy in 3 years for surveillance.  Restaging CTs 07/16/2015 with 2 new hypodense liver lesions concerning for metastases, no other evidence of disease progression  PET scan 07/28/2015 with a single hypermetabolic right liver lesion, no other evidence of metastatic disease  Ultrasound-guided biopsy of the right liver lesion 07/30/2015 confirmed metastatic colon cancer  MRI abdomen 08/20/2015 with similar to slight decrease in size of 2 hepatic metastasis. No new liver lesions or extrahepatic metastatic disease identified.  Radiofrequency ablation of 2 liver lesions 09/25/2015  MRI abdomen 02/02/2016-coagulative necrosis at the 2 ablated liver tumor sites,with no enhancement to suggest residual or recurrent liver tumor. No new liver masses or other findings of metastatic disease in the abdomen.  MRI abdomen 08/10/2016 revealed stable ablation sites, no evidence of progressive metastatic disease  Restaging CT 02/08/2017-stable treated liver lesions, no evidence of progressive metastatic disease, stable borderline enlarged right inguinal node  CEA with stable mild elevation 02/08/2017, 03/01/2017, 03/29/2017,04/19/2017  CEA further elevated 08/21/2017  CTs 09/05/2017-possible local recurrence of tumor along the ablation site right hepatic lobe lesion. Cephalad to the ablation site approximately 1.9 x 2.1cmsoft tissue mass progressive compared to the prior exam.  Liver SBRT 10/17/2017, 10/19/2017, 10/23/2017, 10/25/2017, 10/27/2017  CT 02/19/2018- progressive ill-defined areas of low attenuation with enhancement surrounding the segment 6 ablation site, stable segment 8 ablation site enlargement of extracapsular lesion posteriorly, no new hepatic lesions, wall thickening/edema at the distal ileum 2. Right  ovary cystadenofibroma, status  post a bilateral oophorectomy 10/22/2013 3. Remote history of cervical cancer. 4. Iron deficiency anemia-resolved. 5. Right calf deep vein thrombosis 10/24/2013-she completed 3 months of xarelto. 6. Indeterminate 12 mm hypermetabolic right pelvic density on the staging PET scan 10/04/2013-CT followup recommended per the GI tumor conference 11/13/2013. No longer visualized on a CT 07/14/2014. 7. Soft tissue implant overlying the posterior right liver on the CT 43/83/7793-[facility] hypermetabolic on the PET scan 86/48/4720. 8. C. difficile colitis 03/05/2014. She completed a course of Flagyl, recurrent diarrhea beginning 03/21/2014, positive C. difficile toxin 03/26/2014-resumed Flagyl for planned 10 day course.  Vancomycin beginning 04/08/2014.  Taper complete 05/21/2014.  9. CVA May 2016 10. Skin infection at the right first MTP joint-Keflex prescribed 08/06/2015 11. Guillan-Barre syndrome February 2017 12. Left lower extremity DVTs March 2017-treated with Xarelto   Disposition: [name] appears stable.  There is no clinical evidence of disease progression.  We will follow-up on the CEA from today.  Dr. Benay Spice recommends a follow-up CT scan in approximately 1 month which will be at a 41-monthinterval from the previous.  She agrees with this plan.  She will return for a follow-up visit on 05/29/2018 to review the CT results.  She will contact the office in the interim with any problems.  Plan reviewed with Dr. SBenay Spice    LNed CardANP/GNP-BC   04/24/2018  10:32 AM

## 2018-04-24 NOTE — Telephone Encounter (Signed)
Printed avs and calender of upcoming appointment. Per 9/10 los. Gave patient contrast with  Instruction, and CT number for scheduling.

## 2018-04-25 ENCOUNTER — Telehealth: Payer: Self-pay | Admitting: *Deleted

## 2018-04-25 NOTE — Telephone Encounter (Signed)
-----   Message from Owens Shark, NP sent at 04/25/2018  8:31 AM EDT ----- Please let her know CEA is in normal range.

## 2018-04-25 NOTE — Telephone Encounter (Signed)
Left message for return call to relay lab below as directed.

## 2018-05-17 ENCOUNTER — Other Ambulatory Visit: Payer: Self-pay | Admitting: Physical Medicine & Rehabilitation

## 2018-05-22 ENCOUNTER — Encounter (HOSPITAL_COMMUNITY): Payer: Self-pay

## 2018-05-22 ENCOUNTER — Ambulatory Visit (HOSPITAL_COMMUNITY)
Admission: RE | Admit: 2018-05-22 | Discharge: 2018-05-22 | Disposition: A | Discharge status: Home / Self Care | Payer: Medicare Other | Source: Ambulatory Visit | Attending: Nurse Practitioner | Admitting: Nurse Practitioner

## 2018-05-22 ENCOUNTER — Inpatient Hospital Stay: Payer: Medicare Other | Attending: Oncology

## 2018-05-22 DIAGNOSIS — C787 Secondary malignant neoplasm of liver and intrahepatic bile duct: Secondary | ICD-10-CM | POA: Insufficient documentation

## 2018-05-22 DIAGNOSIS — C189 Malignant neoplasm of colon, unspecified: Secondary | ICD-10-CM | POA: Insufficient documentation

## 2018-05-22 DIAGNOSIS — C7800 Secondary malignant neoplasm of unspecified lung: Secondary | ICD-10-CM | POA: Diagnosis not present

## 2018-05-22 DIAGNOSIS — R202 Paresthesia of skin: Secondary | ICD-10-CM | POA: Diagnosis not present

## 2018-05-22 DIAGNOSIS — I82409 Acute embolism and thrombosis of unspecified deep veins of unspecified lower extremity: Secondary | ICD-10-CM | POA: Diagnosis not present

## 2018-05-22 DIAGNOSIS — C180 Malignant neoplasm of cecum: Secondary | ICD-10-CM | POA: Diagnosis not present

## 2018-05-22 LAB — BASIC METABOLIC PANEL - CANCER CENTER ONLY
ANION GAP: 8 (ref 5–15)
BUN: 14 mg/dL (ref 8–23)
CO2: 30 mmol/L (ref 22–32)
Calcium: 9.9 mg/dL (ref 8.9–10.3)
Chloride: 105 mmol/L (ref 98–111)
Creatinine: 0.91 mg/dL (ref 0.44–1.00)
GFR, EST NON AFRICAN AMERICAN: 58 mL/min — AB (ref >60)
GFR, Est AFR Am: >60 mL/min (ref >60)
GLUCOSE: 91 mg/dL (ref 70–99)
POTASSIUM: 4.5 mmol/L (ref 3.5–5.1)
Sodium: 143 mmol/L (ref 135–145)

## 2018-05-22 LAB — CEA (IN HOUSE-CHCC): CEA (CHCC-IN HOUSE): 2.94 ng/mL (ref 0.00–5.00)

## 2018-05-22 MED ORDER — SODIUM CHLORIDE 0.9 % IJ SOLN
INTRAMUSCULAR | Status: AC
  Filled 2018-05-22: qty 50

## 2018-05-22 MED ORDER — IOHEXOL 300 MG/ML  SOLN
100.0000 mL | Freq: Once | INTRAMUSCULAR | Status: AC | PRN
  Administered 2018-05-22: 100 mL via INTRAVENOUS

## 2018-05-29 ENCOUNTER — Inpatient Hospital Stay (HOSPITAL_BASED_OUTPATIENT_CLINIC_OR_DEPARTMENT_OTHER): Payer: Medicare Other | Admitting: Oncology

## 2018-05-29 ENCOUNTER — Encounter: Payer: Self-pay | Admitting: Oncology

## 2018-05-29 ENCOUNTER — Telehealth: Payer: Self-pay | Admitting: Oncology

## 2018-05-29 VITALS — BP 158/78 | HR 58 | Temp 97.8°F | Resp 18 | Ht 61.0 in | Wt 145.7 lb

## 2018-05-29 DIAGNOSIS — R202 Paresthesia of skin: Secondary | ICD-10-CM

## 2018-05-29 DIAGNOSIS — I82409 Acute embolism and thrombosis of unspecified deep veins of unspecified lower extremity: Secondary | ICD-10-CM

## 2018-05-29 DIAGNOSIS — C180 Malignant neoplasm of cecum: Secondary | ICD-10-CM

## 2018-05-29 DIAGNOSIS — C787 Secondary malignant neoplasm of liver and intrahepatic bile duct: Secondary | ICD-10-CM | POA: Diagnosis not present

## 2018-05-29 DIAGNOSIS — C189 Malignant neoplasm of colon, unspecified: Secondary | ICD-10-CM

## 2018-05-29 NOTE — Progress Notes (Signed)
[place] OFFICE PROGRESS NOTE   Diagnosis: Lung cancer  INTERVAL HISTORY:   Cynthia [name] is as scheduled.  She feels well.  She continues to have tingling in the legs.  Gabapentin helps.  No new complaint. Objective:  Vital signs in last 24 hours:  Blood pressure (!) 158/78, pulse (!) 58, temperature 97.8 F (36.6 C), temperature source Oral, resp. rate 18, height 5' 1"  (1.549 m), weight 145 lb 11.2 oz (66.1 kg), SpO2 98 %.    HEENT: Neck without mass Lymphatics: No cervical, supraclavicular, axillary, or inguinal nodes Resp: Lungs clear bilaterally Cardio: Regular rate and rhythm GI: No hepatomegaly, nontender, no mass Vascular: Leg edema   Lab Results:  Lab Results  Component Value Date   WBC 5.3 01/29/2016   HGB 11.7 01/29/2016   HCT 37.2 01/29/2016   MCV 88.8 01/29/2016   PLT 243 01/29/2016   NEUTROABS 3 12/07/2015    CMP  Lab Results  Component Value Date   NA 143 05/22/2018   K 4.5 05/22/2018   CL 105 05/22/2018   CO2 30 05/22/2018   GLUCOSE 91 05/22/2018   BUN 14 05/22/2018   CREATININE 0.91 05/22/2018   CALCIUM 9.9 05/22/2018   PROT 7.3 09/05/2017   ALBUMIN 3.9 09/05/2017   AST 27 09/05/2017   ALT 19 09/05/2017   ALKPHOS 56 09/05/2017   BILITOT 0.5 09/05/2017   GFRNONAA 58 (L) 05/22/2018   GFRAA >60 05/22/2018    Lab Results  Component Value Date   CEA1 2.94 05/22/2018     Medications: I have reviewed the patient's current medications.   Assessment/Plan: 1. Moderately differentiated adenocarcinoma of the cecum, stage III (T4a, N1), status post a laparoscopic assisted right colectomy 10/22/2013  The tumor returned microsatellite stable with normal mismatch repair protein expression   Cycle 1 adjuvant Xeloda 12/25/2013.   Cycle 2 adjuvant Xeloda 01/15/2014.   Cycle 3 adjuvant Xeloda 02/05/2014.   Cycle 4 adjuvant Xeloda 03/18/2014, discontinued after 4 days secondary to diarrhea   Cycle 5 adjuvant Xeloda  04/16/2014, Xeloda dose reduced to 1000 mg in the a.m. and 500 mg in the p.m.   Cycle 6 adjuvant Xeloda 05/07/2014.  Cycle 7 adjuvant Xeloda 05/28/2014.  Cycle 8 adjuvant Xeloda 06/18/2014.  Restaging CTs on 07/14/2014 with no evidence of metastatic colon cancer  Colonoscopy 10/02/2014 with diverticulosis in the sigmoid colon and in the descending colon. One 4 mm polyp in the sigmoid colon with complete resection. Polyp tissue not retrieved. Internal hemorrhoids. Next colonoscopy in 3 years for surveillance.  Restaging CTs 07/16/2015 with 2 new hypodense liver lesions concerning for metastases, no other evidence of disease progression  PET scan 07/28/2015 with a single hypermetabolic right liver lesion, no other evidence of metastatic disease  Ultrasound-guided biopsy of the right liver lesion 07/30/2015 confirmed metastatic colon cancer  MRI abdomen 08/20/2015 with similar to slight decrease in size of 2 hepatic metastasis. No new liver lesions or extrahepatic metastatic disease identified.  Radiofrequency ablation of 2 liver lesions 09/25/2015  MRI abdomen 02/02/2016-coagulative necrosis at the 2 ablated liver tumor sites,with no enhancement to suggest residual or recurrent liver tumor. No new liver masses or other findings of metastatic disease in the abdomen.  MRI abdomen 08/10/2016 revealed stable ablation sites, no evidence of progressive metastatic disease  Restaging CT 02/08/2017-stable treated liver lesions, no evidence of progressive metastatic disease, stable borderline enlarged right inguinal node  CEA with stable mild elevation 02/08/2017, 03/01/2017, 03/29/2017,04/19/2017  CEA further elevated 08/21/2017  CTs 09/05/2017-possible local recurrence of tumor along the ablation site right hepatic lobe lesion. Cephalad to the ablation site approximately 1.9 x 2.1cmsoft tissue mass progressive compared to the prior exam.  Liver SBRT 10/17/2017, 10/19/2017, 10/23/2017,  10/25/2017, 10/27/2017  CT 02/19/2018- progressive ill-defined areas of low attenuation with enhancement surrounding the segment 6 ablation site, stable segment 8 ablation site enlargement of extracapsular lesion posteriorly, no new hepatic lesions, wall thickening/edema at the distal ileum  CT 05/22/2018- 2 new lung nodules suspicious for metastases, stable segment 8 ablation zone, slight decrease in the size of delayed perfusion at the segment 6 ablation zone, posterior extracapsular lesion is slightly smaller 2. Right ovary cystadenofibroma, status post a bilateral oophorectomy 10/22/2013 3. Remote history of cervical cancer. 4. Iron deficiency anemia-resolved. 5. Right calf deep vein thrombosis 10/24/2013-she completed 3 months of xarelto. 6. Indeterminate 12 mm hypermetabolic right pelvic density on the staging PET scan 10/04/2013-CT followup recommended per the GI tumor conference 11/13/2013. No longer visualized on a CT 07/14/2014. 7. Soft tissue implant overlying the posterior right liver on the CT 38/93/7342-[facility] hypermetabolic on the PET scan 81/15/7262. 8. C. difficile colitis 03/05/2014. She completed a course of Flagyl, recurrent diarrhea beginning 03/21/2014, positive C. difficile toxin 03/26/2014-resumed Flagyl for planned 10 day course.  Vancomycin beginning 04/08/2014.  Taper complete 05/21/2014.  9. CVA May 2016 10. Skin infection at the right first MTP joint-Keflex prescribed 08/06/2015 11. Guillan-Barre syndrome February 2017 12. Left lower extremity DVTs March 2017-treated with Xarelto     Disposition:  Ms. Mcenery appears unchanged.  I discussed the restaging CT findings with her.  I reviewed the CT images.  She did not wish to see the CT scans. She agrees to a staging chest CT.  She would like to wait until after her 82th birthday and Thanksgiving.  She will be scheduled for a CT on 07/13/2018.  She will return for office visit on 07/16/2018.  15 minutes were  spent with the patient today.  The majority of the time was used for counseling and coordination of care. [name], MD  05/29/2018  3:17 PM

## 2018-05-29 NOTE — Telephone Encounter (Signed)
Scheduled appt per 10/15 los - gave patient AVS and calender per los.   

## 2018-07-13 ENCOUNTER — Ambulatory Visit (HOSPITAL_COMMUNITY)
Admission: RE | Admit: 2018-07-13 | Discharge: 2018-07-13 | Disposition: A | Discharge status: Home / Self Care | Payer: Medicare Other | Source: Ambulatory Visit | Attending: Oncology | Admitting: Oncology

## 2018-07-13 DIAGNOSIS — C787 Secondary malignant neoplasm of liver and intrahepatic bile duct: Secondary | ICD-10-CM | POA: Diagnosis present

## 2018-07-13 DIAGNOSIS — C189 Malignant neoplasm of colon, unspecified: Secondary | ICD-10-CM | POA: Diagnosis present

## 2018-07-16 ENCOUNTER — Telehealth: Payer: Self-pay

## 2018-07-16 ENCOUNTER — Inpatient Hospital Stay: Payer: Medicare Other

## 2018-07-16 ENCOUNTER — Inpatient Hospital Stay: Payer: Medicare Other | Attending: Oncology | Admitting: Oncology

## 2018-07-16 ENCOUNTER — Telehealth: Payer: Self-pay | Admitting: Pharmacist

## 2018-07-16 VITALS — BP 163/90 | HR 74 | Temp 97.5°F | Resp 16 | Ht 61.0 in | Wt 142.8 lb

## 2018-07-16 DIAGNOSIS — C787 Secondary malignant neoplasm of liver and intrahepatic bile duct: Principal | ICD-10-CM

## 2018-07-16 DIAGNOSIS — C189 Malignant neoplasm of colon, unspecified: Secondary | ICD-10-CM

## 2018-07-16 DIAGNOSIS — Z86718 Personal history of other venous thrombosis and embolism: Secondary | ICD-10-CM | POA: Diagnosis not present

## 2018-07-16 DIAGNOSIS — R222 Localized swelling, mass and lump, trunk: Secondary | ICD-10-CM | POA: Diagnosis not present

## 2018-07-16 DIAGNOSIS — R918 Other nonspecific abnormal finding of lung field: Secondary | ICD-10-CM

## 2018-07-16 DIAGNOSIS — C180 Malignant neoplasm of cecum: Secondary | ICD-10-CM | POA: Insufficient documentation

## 2018-07-16 LAB — CBC WITH DIFFERENTIAL (CANCER CENTER ONLY)
Abs Immature Granulocytes: 0.02 10*3/uL (ref 0.00–0.07)
Basophils Absolute: 0 10*3/uL (ref 0.0–0.1)
Basophils Relative: 1 %
Eosinophils Absolute: 0.2 10*3/uL (ref 0.0–0.5)
Eosinophils Relative: 3 %
HCT: 36.2 % (ref 36.0–46.0)
Hemoglobin: 11.5 g/dL — ABNORMAL LOW (ref 12.0–15.0)
Immature Granulocytes: 0 %
Lymphocytes Relative: 22 %
Lymphs Abs: 1.3 10*3/uL (ref 0.7–4.0)
MCH: 29.6 pg (ref 26.0–34.0)
MCHC: 31.8 g/dL (ref 30.0–36.0)
MCV: 93.1 fL (ref 80.0–100.0)
MONO ABS: 0.5 10*3/uL (ref 0.1–1.0)
Monocytes Relative: 8 %
Neutro Abs: 3.8 10*3/uL (ref 1.7–7.7)
Neutrophils Relative %: 66 %
PLATELETS: 200 10*3/uL (ref 150–400)
RBC: 3.89 MIL/uL (ref 3.87–5.11)
RDW: 13.9 % (ref 11.5–15.5)
WBC Count: 5.8 10*3/uL (ref 4.0–10.5)
nRBC: 0 % (ref 0.0–0.2)

## 2018-07-16 LAB — CMP (CANCER CENTER ONLY)
ALT: 16 U/L (ref 0–44)
AST: 27 U/L (ref 15–41)
Albumin: 3.4 g/dL — ABNORMAL LOW (ref 3.5–5.0)
Alkaline Phosphatase: 49 U/L (ref 38–126)
Anion gap: 7 (ref 5–15)
BUN: 11 mg/dL (ref 8–23)
CO2: 28 mmol/L (ref 22–32)
Calcium: 9.4 mg/dL (ref 8.9–10.3)
Chloride: 107 mmol/L (ref 98–111)
Creatinine: 0.92 mg/dL (ref 0.44–1.00)
GFR, Estimated: 59 mL/min — ABNORMAL LOW (ref >60)
Glucose, Bld: 82 mg/dL (ref 70–99)
Potassium: 4.2 mmol/L (ref 3.5–5.1)
Sodium: 142 mmol/L (ref 135–145)
Total Bilirubin: 0.6 mg/dL (ref 0.3–1.2)
Total Protein: 6.5 g/dL (ref 6.5–8.1)

## 2018-07-16 LAB — CEA (IN HOUSE-CHCC): CEA (CHCC-In House): 2.79 ng/mL (ref 0.00–5.00)

## 2018-07-16 MED ORDER — CAPECITABINE 500 MG PO TABS: ORAL_TABLET | ORAL | 0 refills | Status: DC

## 2018-07-16 NOTE — Telephone Encounter (Signed)
Oral Oncology Pharmacist Encounter  Received new referral for Xeloda (capecitabine) for the maintenance treatment of colon cancer, planned duration until disease progression or unacceptable toxicity.  Original diagnosis in March 2015 Patient received 8 cycles of adjuvant Xeloda at that time, necessitating dose decrease halfway through secondary to diarrhea Patient has been on observation along with every 3-year colonoscopies since that time, with radiofrequency ablation of 2 liver lesions in February 2017 CT scan performed 07/13/2018 now shows new pulmonary nodules and patient is under evaluation to restart therapy with single agent Xeloda  Labs from Epic assessed, OK for treatment.  Current medication list in Epic reviewed, no DDIs with Xeloda identified.  Prescription has been sent to the [facility] long outpatient pharmacy for benefits investigation and approval.  Xeloda is planned to be dosed at ~600 mg/m2 BID for 14 days on, 7 days off, repeated every 21 days Xeloda has been reduced due to previous intolerance  Oral Oncology Clinic will continue to follow for insurance authorization, copayment issues, initial counseling and start date.  Johny Drilling, PharmD, BCPS, BCOP  07/16/2018 12:59 PM Oral Oncology Clinic [phone]

## 2018-07-16 NOTE — Progress Notes (Signed)
[place] OFFICE PROGRESS NOTE   Diagnosis: Colon cancer  INTERVAL HISTORY:   Cynthia [name] returns as scheduled.  A restaging chest CT on 07/13/2018 revealed multiple new lung nodules consistent with metastatic disease. She has noted a raised area at the right lower chest wall.  No associated pain.  No other complaint.  Objective:  Vital signs in last 24 hours:  Blood pressure (!) 163/90, pulse 74, temperature (!) 97.5 F (36.4 C), temperature source Oral, resp. rate 16, height _0  (1.549 m), weight 142 lb 12.8 oz (64.8 kg), SpO2 95 %.    Resp: Lungs clear bilaterally Cardio: Regular rate and rhythm GI: No hepatomegaly, nontender, no mass Vascular: No leg edema  Skin: Mild erythema overlying an area of masslike thickening at the right lower lateral chest wall but no fluctuance or tenderness    Lab Results:  Lab Results  Component Value Date   WBC 5.3 01/29/2016   HGB 11.7 01/29/2016   HCT 37.2 01/29/2016   MCV 88.8 01/29/2016   PLT 243 01/29/2016   NEUTROABS 3 12/07/2015    CMP  Lab Results  Component Value Date   NA 143 05/22/2018   K 4.5 05/22/2018   CL 105 05/22/2018   CO2 30 05/22/2018   GLUCOSE 91 05/22/2018   BUN 14 05/22/2018   CREATININE 0.91 05/22/2018   CALCIUM 9.9 05/22/2018   PROT 7.3 09/05/2017   ALBUMIN 3.9 09/05/2017   AST 27 09/05/2017   ALT 19 09/05/2017   ALKPHOS 56 09/05/2017   BILITOT 0.5 09/05/2017   GFRNONAA 58 (L) 05/22/2018   GFRAA >60 05/22/2018    Lab Results  Component Value Date   CEA1 2.94 05/22/2018     Imaging:  Ct Chest Wo Contrast  Result Date: 07/13/2018 CLINICAL DATA:  82 year old female with history of metastatic colon cancer with known liver metastasis. Prior history of liver ablation and radiation therapy to the liver. EXAM: CT CHEST WITHOUT CONTRAST TECHNIQUE: Multidetector CT imaging of the chest was performed following the standard protocol without IV contrast. COMPARISON:  CT the  chest, abdomen and pelvis 09/05/2017. FINDINGS: Cardiovascular: Heart size is normal. There is no significant pericardial fluid, thickening or pericardial calcification. There is aortic atherosclerosis, as well as atherosclerosis of the great vessels of the mediastinum and the coronary arteries, including calcified atherosclerotic plaque in the left main, left anterior descending, left circumflex and right coronary arteries. Mild ectasia of the ascending thoracic aorta (4 cm in diameter). Mediastinum/Nodes: No pathologically enlarged mediastinal or hilar lymph nodes. Please note that accurate exclusion of hilar adenopathy is limited on noncontrast CT scans. Esophagus is unremarkable in appearance. No axillary lymphadenopathy. Lungs/Pleura: Multiple new pulmonary nodules are noted in the lungs bilaterally, concerning for metastatic disease. The largest of these is in the central aspect of the right middle lobe (axial image 93 of series 5) measuring 1.1 x 1.4 cm. No acute consolidative airspace disease. No pleural effusions. Linear scarring in the inferior aspect of the right lower lobe and inferior segment of the lingula. Upper Abdomen: Low-attenuation lesions in the liver, compatible with simple cysts, largest of which measures 6 cm in segment 3. Previously noted hypovascular lesion in segment 6 of the liver is poorly demonstrated on today's noncontrast CT examination, but is estimated to measure approximately 2.6 cm (axial image 148 of series 2), stable compared to the prior examination. Musculoskeletal: There are no aggressive appearing lytic or blastic lesions noted in the visualized portions of the skeleton. IMPRESSION:  1. Multiple new pulmonary nodules scattered throughout the lungs bilaterally, highly concerning for widespread metastatic disease to the lungs. The largest of these is in the central aspect of the right middle lobe (axial image 93 of series 5) measuring 1.4 x 1.1 cm. 2. Previously noted liver  lesion appears grossly stable compared to the prior examination. 3. Aortic atherosclerosis, in addition to left main and 3 vessel coronary artery disease. There is also ectasia of the ascending thoracic aorta (4 cm in diameter). Recommend annual imaging followup by CTA or MRA. This recommendation follows 2010 ACCF/AHA/AATS/ACR/ASA/SCA/SCAI/SIR/STS/SVM Guidelines for the Diagnosis and Management of Patients with Thoracic Aortic Disease. Circulation. 2010; 121: W237-S283. Aortic Atherosclerosis (ICD10-I70.0). Electronically Signed   By: Vinnie Langton M.D.   On: 07/13/2018 12:11  CT images reviewed with Ms. Grenfell and her daughter  Medications: I have reviewed the patient's current medications.   Assessment/Plan: 1. Moderately differentiated adenocarcinoma of the cecum, stage III (T4a, N1), status post a laparoscopic assisted right colectomy 10/22/2013  The tumor returned microsatellite stable with normal mismatch repair protein expression   Cycle 1 adjuvant Xeloda 12/25/2013.   Cycle 2 adjuvant Xeloda 01/15/2014.   Cycle 3 adjuvant Xeloda 02/05/2014.   Cycle 4 adjuvant Xeloda 03/18/2014, discontinued after 4 days secondary to diarrhea   Cycle 5 adjuvant Xeloda 04/16/2014, Xeloda dose reduced to 1000 mg in the a.m. and 500 mg in the p.m.   Cycle 6 adjuvant Xeloda 05/07/2014.  Cycle 7 adjuvant Xeloda 05/28/2014.  Cycle 8 adjuvant Xeloda 06/18/2014.  Restaging CTs on 07/14/2014 with no evidence of metastatic colon cancer  Colonoscopy 10/02/2014 with diverticulosis in the sigmoid colon and in the descending colon. One 4 mm polyp in the sigmoid colon with complete resection. Polyp tissue not retrieved. Internal hemorrhoids. Next colonoscopy in 3 years for surveillance.  Restaging CTs 07/16/2015 with 2 new hypodense liver lesions concerning for metastases, no other evidence of disease progression  PET scan 07/28/2015 with a single hypermetabolic right liver lesion, no other  evidence of metastatic disease  Ultrasound-guided biopsy of the right liver lesion 07/30/2015 confirmed metastatic colon cancer  MRI abdomen 08/20/2015 with similar to slight decrease in size of 2 hepatic metastasis. No new liver lesions or extrahepatic metastatic disease identified.  Radiofrequency ablation of 2 liver lesions 09/25/2015  MRI abdomen 02/02/2016-coagulative necrosis at the 2 ablated liver tumor sites,with no enhancement to suggest residual or recurrent liver tumor. No new liver masses or other findings of metastatic disease in the abdomen.  MRI abdomen 08/10/2016 revealed stable ablation sites, no evidence of progressive metastatic disease  Restaging CT 02/08/2017-stable treated liver lesions, no evidence of progressive metastatic disease, stable borderline enlarged right inguinal node  CEA with stable mild elevation 02/08/2017, 03/01/2017, 03/29/2017,04/19/2017  CEA further elevated 08/21/2017  CTs 09/05/2017-possible local recurrence of tumor along the ablation site right hepatic lobe lesion. Cephalad to the ablation site approximately 1.9 x 2.1cmsoft tissue mass progressive compared to the prior exam.  Liver SBRT 10/17/2017, 10/19/2017, 10/23/2017, 10/25/2017, 10/27/2017  CT 02/19/2018- progressive ill-defined areas of low attenuation with enhancement surrounding the segment 6 ablation site, stable segment 8 ablation site enlargement of extracapsular lesion posteriorly, no new hepatic lesions, wall thickening/edema at the distal ileum  CT 05/22/2018- 2 new lung nodules suspicious for metastases, stable segment 8 ablation zone, slight decrease in the size of delayed perfusion at the segment 6 ablation zone, posterior extracapsular lesion is slightly smaller  CT chest 07/13/2018- multiple lung nodules consistent with metastatic disease 2. Right ovary cystadenofibroma,  status post a bilateral oophorectomy 10/22/2013 3. Remote history of cervical cancer. 4. Iron deficiency  anemia-resolved. 5. Right calf deep vein thrombosis 10/24/2013-she completed 3 months of xarelto. 6. Indeterminate 12 mm hypermetabolic right pelvic density on the staging PET scan 10/04/2013-CT followup recommended per the GI tumor conference 11/13/2013. No longer visualized on a CT 07/14/2014. 7. Soft tissue implant overlying the posterior right liver on the CT 16/55/3748-OLM hypermetabolic on the PET scan 78/67/5449. 8. C. difficile colitis 03/05/2014. She completed a course of Flagyl, recurrent diarrhea beginning 03/21/2014, positive C. difficile toxin 03/26/2014-resumed Flagyl for planned 10 day course.  Vancomycin beginning 04/08/2014.  Taper complete 05/21/2014.  9. CVA May 2016 10. Skin infection at the right first MTP joint-Keflex prescribed 08/06/2015 11. Guillan-Barre syndrome February 2017 12. Left lower extremity DVTs March 2017-treated with Xarelto 13.  Right chest wall mass noted 07/16/2018   Disposition: Ms. Nihiser has a history of metastatic colon cancer.  She appears to have metastatic disease involving multiple lung nodules.  The etiology of the right chest wall masslike thickening is unclear.  I suspect this is related to recurrence of colon cancer, potentially related to the ablation procedure cutaneous tract.  I discussed treatment options with CynthiaDahle and her daughter.  We discussed observation, single agent Xeloda, Xeloda/Avastin, and FOLFIRI.  She does not wish to consider oxaliplatin based therapy given the history of Ardine Eng and persistent neuropathy symptoms.  We discussed the treatment schedule and potential toxicities associated with each of these regimens.  She does not wish to receive intravenous therapy at present, but would consider this in the future.  She would like to proceed with single agent capecitabine.  We reviewed potential toxicities associated with capecitabine including the chance for mucositis, diarrhea, and hand/foot syndrome.   We also discussed the uncommon cardiac and neuropathy toxicities associated with capecitabine.  She had diarrhea with Xeloda in the adjuvant setting.  Xeloda will be dose reduced.  The plan is to begin capecitabine 07/23/2018.  She will return for an office and lab visit on 08/09/2018.  She will contact us for increased erythema or discomfort associated with the right chest wall mass.  40 minutes were spent with the patient today.  The majority of the time was used for counseling and coordination of care.  [name], MD  07/16/2018  12:32 PM

## 2018-07-16 NOTE — Telephone Encounter (Signed)
Oral Chemotherapy Pharmacist Encounter   I spoke with patient and daughter, Hilda Blades, for overview of: Xeloda (capecitabine) for the maintenance treatment of colon cancer, planned duration until disease progression or unacceptable toxicity.  Counseled patient on administration, dosing, side effects, monitoring, drug-food interactions, safe handling, storage, and disposal.  Patient will take Xeloda 500mg  tablets, 2 tablets (1000mg ) by mouth in AM and 2 tabs (1000mg ) by mouth in PM, within 30 minutes of finishing meals, on days 1-14 of each 21 day cycle.   Xeloda start date: 07/23/2018  Adverse effects include but are not limited to: fatigue, decreased blood counts, GI upset, diarrhea, mouth sores, and hand-foot syndrome.  Patient has anti-emetic on hand and knows to take it if nausea develops.   Patient will obtain anti diarrheal and alert the office of 4 or more loose stools above baseline.  Reviewed with patient importance of keeping a medication schedule and plan for any missed doses.  Mrs. Fronek voiced understanding and appreciation.   Patient with questions about whether or not she will be able to spend time with her grandchildren during Christmas. Patient instructed to flush toilet twice after going to the bathroom. Patient instructed that she will be able to hold and kiss her family during the holiday times.  All questions answered. Medication reconciliation performed and medication/allergy list updated.  Will follow up with patient regarding insurance and pharmacy once insurance authorization has been obtained.   Patient knows to call the office with questions or concerns. Oral Oncology Clinic will continue to follow.  Johny Drilling, PharmD, BCPS, BCOP  07/16/2018   2:11 PM Oral Oncology Clinic [phone]

## 2018-07-16 NOTE — Telephone Encounter (Signed)
Printed avs and calender of upcoming appointment. Per 12/2 los 

## 2018-07-17 MED FILL — CAPECITABINE 500 MG TABS: 500 | 21 days supply | Qty: 56 | Fill #0

## 2018-07-18 ENCOUNTER — Other Ambulatory Visit: Payer: Self-pay | Admitting: *Deleted

## 2018-07-18 MED ORDER — PROCHLORPERAZINE MALEATE 5 MG PO TABS: 5.0000 mg | ORAL_TABLET | Freq: Four times a day (QID) | ORAL | 0 refills | Status: DC | PRN

## 2018-07-18 NOTE — Telephone Encounter (Signed)
Oral Oncology Patient Advocate Encounter  Confirmed with [place] that Xeloda was picked up on 07/17/18 with a $50 copay.   [place] Patient [place] Phone [phone] Fax [phone]

## 2018-08-07 ENCOUNTER — Other Ambulatory Visit: Payer: Self-pay | Admitting: Physical Medicine & Rehabilitation

## 2018-08-09 ENCOUNTER — Telehealth: Payer: Self-pay | Admitting: Oncology

## 2018-08-09 ENCOUNTER — Inpatient Hospital Stay (HOSPITAL_BASED_OUTPATIENT_CLINIC_OR_DEPARTMENT_OTHER): Payer: Medicare Other | Admitting: Oncology

## 2018-08-09 ENCOUNTER — Inpatient Hospital Stay: Payer: Medicare Other

## 2018-08-09 VITALS — BP 151/80 | HR 72 | Temp 98.0°F | Resp 18 | Ht 61.0 in | Wt 139.9 lb

## 2018-08-09 DIAGNOSIS — R222 Localized swelling, mass and lump, trunk: Secondary | ICD-10-CM | POA: Diagnosis not present

## 2018-08-09 DIAGNOSIS — C189 Malignant neoplasm of colon, unspecified: Secondary | ICD-10-CM

## 2018-08-09 DIAGNOSIS — R918 Other nonspecific abnormal finding of lung field: Secondary | ICD-10-CM | POA: Diagnosis not present

## 2018-08-09 DIAGNOSIS — C180 Malignant neoplasm of cecum: Secondary | ICD-10-CM | POA: Diagnosis not present

## 2018-08-09 DIAGNOSIS — C787 Secondary malignant neoplasm of liver and intrahepatic bile duct: Secondary | ICD-10-CM

## 2018-08-09 LAB — CBC WITH DIFFERENTIAL (CANCER CENTER ONLY)
Abs Immature Granulocytes: 0.01 10*3/uL (ref 0.00–0.07)
Basophils Absolute: 0 10*3/uL (ref 0.0–0.1)
Basophils Relative: 0 %
Eosinophils Absolute: 0.1 10*3/uL (ref 0.0–0.5)
Eosinophils Relative: 2 %
HCT: 35.1 % — ABNORMAL LOW (ref 36.0–46.0)
Hemoglobin: 11.1 g/dL — ABNORMAL LOW (ref 12.0–15.0)
IMMATURE GRANULOCYTES: 0 %
LYMPHS ABS: 1.3 10*3/uL (ref 0.7–4.0)
LYMPHS PCT: 23 %
MCH: 30.2 pg (ref 26.0–34.0)
MCHC: 31.6 g/dL (ref 30.0–36.0)
MCV: 95.6 fL (ref 80.0–100.0)
Monocytes Absolute: 0.6 10*3/uL (ref 0.1–1.0)
Monocytes Relative: 10 %
Neutro Abs: 3.8 10*3/uL (ref 1.7–7.7)
Neutrophils Relative %: 65 %
PLATELETS: 211 10*3/uL (ref 150–400)
RBC: 3.67 MIL/uL — ABNORMAL LOW (ref 3.87–5.11)
RDW: 14.7 % (ref 11.5–15.5)
WBC Count: 5.8 10*3/uL (ref 4.0–10.5)
nRBC: 0 % (ref 0.0–0.2)

## 2018-08-09 LAB — CMP (CANCER CENTER ONLY)
ALT: 11 U/L (ref 0–44)
AST: 22 U/L (ref 15–41)
Albumin: 3.6 g/dL (ref 3.5–5.0)
Alkaline Phosphatase: 54 U/L (ref 38–126)
Anion gap: 6 (ref 5–15)
BUN: 14 mg/dL (ref 8–23)
CHLORIDE: 107 mmol/L (ref 98–111)
CO2: 28 mmol/L (ref 22–32)
Calcium: 9.6 mg/dL (ref 8.9–10.3)
Creatinine: 0.91 mg/dL (ref 0.44–1.00)
GFR, Est AFR Am: >60 mL/min (ref >60)
GFR, Estimated: 60 mL/min — ABNORMAL LOW (ref >60)
Glucose, Bld: 88 mg/dL (ref 70–99)
POTASSIUM: 4.5 mmol/L (ref 3.5–5.1)
SODIUM: 141 mmol/L (ref 135–145)
Total Bilirubin: 0.5 mg/dL (ref 0.3–1.2)
Total Protein: 6.7 g/dL (ref 6.5–8.1)

## 2018-08-09 MED ORDER — CAPECITABINE 500 MG PO TABS: ORAL_TABLET | ORAL | 0 refills | Status: DC

## 2018-08-09 MED FILL — CAPECITABINE 500 MG TABS: 500 | 21 days supply | Qty: 56 | Fill #0

## 2018-08-09 NOTE — Progress Notes (Signed)
[facility] OFFICE PROGRESS NOTE   Diagnosis: Colon cancer  INTERVAL HISTORY:   Cynthia [name] completed a cycle of Xeloda beginning 07/23/2018.  No mouth sores, nausea, or hand/foot pain.  She developed diarrhea this morning.  This resolved after taking Imodium.  The discomfort at the right chest wall and induration has improved.  Objective:  Vital signs in last 24 hours:  Blood pressure (!) 151/80, pulse 72, temperature 98 F (36.7 C), temperature source Oral, resp. rate 18, height _0  (1.549 m), weight 139 lb 14.4 oz (63.5 kg), SpO2 97 %.    HEENT: No thrush or ulcers Resp: Lungs clear bilaterally Cardio: Regular rate and rhythm GI: No hepatosplenomegaly, nontender Vascular: Trace pitting edema at the lower leg bilaterally  Skin: Palms without erythema, area of masslike induration at the right low anterolateral chest wall with mild overlying erythema   Lab Results:  Lab Results  Component Value Date   WBC 5.8 08/09/2018   HGB 11.1 (L) 08/09/2018   HCT 35.1 (L) 08/09/2018   MCV 95.6 08/09/2018   PLT 211 08/09/2018   NEUTROABS 3.8 08/09/2018    CMP  Lab Results  Component Value Date   NA 142 07/16/2018   K 4.2 07/16/2018   CL 107 07/16/2018   CO2 28 07/16/2018   GLUCOSE 82 07/16/2018   BUN 11 07/16/2018   CREATININE 0.92 07/16/2018   CALCIUM 9.4 07/16/2018   PROT 6.5 07/16/2018   ALBUMIN 3.4 (L) 07/16/2018   AST 27 07/16/2018   ALT 16 07/16/2018   ALKPHOS 49 07/16/2018   BILITOT 0.6 07/16/2018   GFRNONAA 59 (L) 07/16/2018   GFRAA >60 07/16/2018    Lab Results  Component Value Date   CEA1 2.79 07/16/2018     Medications: I have reviewed the patient's current medications.   Assessment/Plan: 1. Moderately differentiated adenocarcinoma of the cecum, stage III (T4a, N1), status post a laparoscopic assisted right colectomy 10/22/2013  The tumor returned microsatellite stable with normal mismatch repair protein expression   Cycle 1  adjuvant Xeloda 12/25/2013.   Cycle 2 adjuvant Xeloda 01/15/2014.   Cycle 3 adjuvant Xeloda 02/05/2014.   Cycle 4 adjuvant Xeloda 03/18/2014, discontinued after 4 days secondary to diarrhea   Cycle 5 adjuvant Xeloda 04/16/2014, Xeloda dose reduced to 1000 mg in the a.m. and 500 mg in the p.m.   Cycle 6 adjuvant Xeloda 05/07/2014.  Cycle 7 adjuvant Xeloda 05/28/2014.  Cycle 8 adjuvant Xeloda 06/18/2014.  Restaging CTs on 07/14/2014 with no evidence of metastatic colon cancer  Colonoscopy 10/02/2014 with diverticulosis in the sigmoid colon and in the descending colon. One 4 mm polyp in the sigmoid colon with complete resection. Polyp tissue not retrieved. Internal hemorrhoids. Next colonoscopy in 3 years for surveillance.  Restaging CTs 07/16/2015 with 2 new hypodense liver lesions concerning for metastases, no other evidence of disease progression  PET scan 07/28/2015 with a single hypermetabolic right liver lesion, no other evidence of metastatic disease  Ultrasound-guided biopsy of the right liver lesion 07/30/2015 confirmed metastatic colon cancer  MRI abdomen 08/20/2015 with similar to slight decrease in size of 2 hepatic metastasis. No new liver lesions or extrahepatic metastatic disease identified.  Radiofrequency ablation of 2 liver lesions 09/25/2015  MRI abdomen 02/02/2016-coagulative necrosis at the 2 ablated liver tumor sites,with no enhancement to suggest residual or recurrent liver tumor. No new liver masses or other findings of metastatic disease in the abdomen.  MRI abdomen 08/10/2016 revealed stable ablation sites, no evidence of progressive  metastatic disease  Restaging CT 02/08/2017-stable treated liver lesions, no evidence of progressive metastatic disease, stable borderline enlarged right inguinal node  CEA with stable mild elevation 02/08/2017, 03/01/2017, 03/29/2017,04/19/2017  CEA further elevated 08/21/2017  CTs 09/05/2017-possible local recurrence  of tumor along the ablation site right hepatic lobe lesion. Cephalad to the ablation site approximately 1.9 x 2.1cmsoft tissue mass progressive compared to the prior exam.  Liver SBRT 10/17/2017, 10/19/2017, 10/23/2017, 10/25/2017, 10/27/2017  CT 02/19/2018- progressive ill-defined areas of low attenuation with enhancement surrounding the segment 6 ablation site, stable segment 8 ablation site enlargement of extracapsular lesion posteriorly, no new hepatic lesions, wall thickening/edema at the distal ileum  CT 05/22/2018- 2 new lung nodules suspicious for metastases, stable segment 8 ablation zone, slight decrease in the size of delayed perfusion at the segment 6 ablation zone, posterior extracapsular lesion is slightly smaller  CT chest 07/13/2018- multiple lung nodules consistent with metastatic disease  Cycle 1 Xeloda 07/23/2018  Cycle 2 Xeloda 08/13/2018 2. Right ovary cystadenofibroma, status post a bilateral oophorectomy 10/22/2013 3. Remote history of cervical cancer. 4. Iron deficiency anemia-resolved. 5. Right calf deep vein thrombosis 10/24/2013-she completed 3 months of xarelto. 6. Indeterminate 12 mm hypermetabolic right pelvic density on the staging PET scan 10/04/2013-CT followup recommended per the GI tumor conference 11/13/2013. No longer visualized on a CT 07/14/2014. 7. Soft tissue implant overlying the posterior right liver on the CT 91/91/6606-YOK hypermetabolic on the PET scan 59/97/7414. 8. C. difficile colitis 03/05/2014. She completed a course of Flagyl, recurrent diarrhea beginning 03/21/2014, positive C. difficile toxin 03/26/2014-resumed Flagyl for planned 10 day course.  Vancomycin beginning 04/08/2014.  Taper complete 05/21/2014.  9. CVA May 2016 10. Skin infection at the right first MTP joint-Keflex prescribed 08/06/2015 11. Guillan-Barre syndrome February 2017 12. Left lower extremity DVTs March 2017-treated with Xarelto 13.  Right chest wall mass noted  07/16/2018     Disposition: Cynthia [name] appears unchanged.  She tolerated the first cycle of Xeloda well.  She will contact us and hold Xeloda if the diarrhea has not resolved when she is due to begin treatment 08/13/2018.  She feels the right chest wall mass is partially improved.  She will return for an office and lab visit in 3 weeks.  She will contact us in the interim for diarrhea.  [name], MD  08/09/2018  12:19 PM

## 2018-08-09 NOTE — Telephone Encounter (Signed)
Printed calendar and avs. °

## 2018-08-30 ENCOUNTER — Inpatient Hospital Stay: Payer: Medicare Other | Admitting: Nurse Practitioner

## 2018-08-30 ENCOUNTER — Other Ambulatory Visit: Payer: Self-pay

## 2018-08-30 ENCOUNTER — Inpatient Hospital Stay: Payer: Medicare Other | Attending: Oncology

## 2018-08-30 ENCOUNTER — Encounter: Payer: Self-pay | Admitting: Nurse Practitioner

## 2018-08-30 ENCOUNTER — Telehealth: Payer: Self-pay | Admitting: Nurse Practitioner

## 2018-08-30 VITALS — BP 134/83 | HR 67 | Temp 97.9°F | Resp 17 | Ht 61.0 in | Wt 141.6 lb

## 2018-08-30 DIAGNOSIS — C189 Malignant neoplasm of colon, unspecified: Secondary | ICD-10-CM

## 2018-08-30 DIAGNOSIS — R5383 Other fatigue: Secondary | ICD-10-CM | POA: Insufficient documentation

## 2018-08-30 DIAGNOSIS — C180 Malignant neoplasm of cecum: Secondary | ICD-10-CM

## 2018-08-30 DIAGNOSIS — C7800 Secondary malignant neoplasm of unspecified lung: Secondary | ICD-10-CM

## 2018-08-30 DIAGNOSIS — C787 Secondary malignant neoplasm of liver and intrahepatic bile duct: Secondary | ICD-10-CM | POA: Diagnosis not present

## 2018-08-30 DIAGNOSIS — R222 Localized swelling, mass and lump, trunk: Secondary | ICD-10-CM

## 2018-08-30 LAB — CMP (CANCER CENTER ONLY)
ALT: 11 U/L (ref 0–44)
AST: 21 U/L (ref 15–41)
Albumin: 3.7 g/dL (ref 3.5–5.0)
Alkaline Phosphatase: 58 U/L (ref 38–126)
Anion gap: 6 (ref 5–15)
BUN: 16 mg/dL (ref 8–23)
CALCIUM: 8.9 mg/dL (ref 8.9–10.3)
CO2: 30 mmol/L (ref 22–32)
Chloride: 106 mmol/L (ref 98–111)
Creatinine: 0.92 mg/dL (ref 0.44–1.00)
GFR, Est AFR Am: >60 mL/min (ref >60)
GFR, Estimated: 59 mL/min — ABNORMAL LOW (ref >60)
Glucose, Bld: 132 mg/dL — ABNORMAL HIGH (ref 70–99)
Potassium: 3.7 mmol/L (ref 3.5–5.1)
Sodium: 142 mmol/L (ref 135–145)
TOTAL PROTEIN: 6.9 g/dL (ref 6.5–8.1)
Total Bilirubin: 0.6 mg/dL (ref 0.3–1.2)

## 2018-08-30 LAB — CBC WITH DIFFERENTIAL (CANCER CENTER ONLY)
Abs Immature Granulocytes: 0.01 10*3/uL (ref 0.00–0.07)
BASOS PCT: 0 %
Basophils Absolute: 0 10*3/uL (ref 0.0–0.1)
EOS ABS: 0.2 10*3/uL (ref 0.0–0.5)
Eosinophils Relative: 3 %
HCT: 35 % — ABNORMAL LOW (ref 36.0–46.0)
Hemoglobin: 11.3 g/dL — ABNORMAL LOW (ref 12.0–15.0)
Immature Granulocytes: 0 %
Lymphocytes Relative: 23 %
Lymphs Abs: 1.4 10*3/uL (ref 0.7–4.0)
MCH: 31.1 pg (ref 26.0–34.0)
MCHC: 32.3 g/dL (ref 30.0–36.0)
MCV: 96.4 fL (ref 80.0–100.0)
Monocytes Absolute: 0.6 10*3/uL (ref 0.1–1.0)
Monocytes Relative: 10 %
Neutro Abs: 3.7 10*3/uL (ref 1.7–7.7)
Neutrophils Relative %: 64 %
Platelet Count: 219 10*3/uL (ref 150–400)
RBC: 3.63 MIL/uL — ABNORMAL LOW (ref 3.87–5.11)
RDW: 17.8 % — AB (ref 11.5–15.5)
WBC Count: 5.9 10*3/uL (ref 4.0–10.5)
nRBC: 0 % (ref 0.0–0.2)

## 2018-08-30 MED ORDER — CAPECITABINE 500 MG PO TABS: ORAL_TABLET | ORAL | 0 refills | Status: DC

## 2018-08-30 NOTE — Telephone Encounter (Signed)
Printed calendar and avs. °

## 2018-08-30 NOTE — Progress Notes (Signed)
Per Lattie Haw refilled Xeloda at [facility]. Start date 09/03/18

## 2018-08-30 NOTE — Progress Notes (Signed)
Cynthia [name] OFFICE PROGRESS NOTE   Diagnosis: Colon cancer  INTERVAL HISTORY:   Cynthia [name] returns as scheduled.  She completed cycle 2 Xeloda beginning 08/13/2018.  She denies nausea/vomiting.  No mouth sores.  No diarrhea.  No hand or foot pain or redness.  She does note progressive fatigue.  No shortness of breath.  No abdominal pain.  Objective:  Vital signs in last 24 hours:  Blood pressure 134/83, pulse 67, temperature 97.9 F (36.6 C), temperature source Oral, resp. rate 17, height 5' 1"  (1.549 m), weight 141 lb 9.6 oz (64.2 kg), SpO2 99 %.    HEENT: No thrush or ulcers. Resp: Lungs clear bilaterally. Cardio: Regular rate and rhythm. GI: Abdomen soft and nontender.  No hepatomegaly. Vascular: No leg edema.  Skin: Palms with mild erythema.  No skin breakdown.  Soles without erythema.   Lab Results:  Lab Results  Component Value Date   WBC 5.9 08/30/2018   HGB 11.3 (L) 08/30/2018   HCT 35.0 (L) 08/30/2018   MCV 96.4 08/30/2018   PLT 219 08/30/2018   NEUTROABS 3.7 08/30/2018    Imaging:  No results found.  Medications: I have reviewed the patient's current medications.  Assessment/Plan: 1. Moderately differentiated adenocarcinoma of the cecum, stage III (T4a, N1), status post a laparoscopic assisted right colectomy 10/22/2013  The tumor returned microsatellite stable with normal mismatch repair protein expression   Cycle 1 adjuvant Xeloda 12/25/2013.   Cycle 2 adjuvant Xeloda 01/15/2014.   Cycle 3 adjuvant Xeloda 02/05/2014.   Cycle 4 adjuvant Xeloda 03/18/2014, discontinued after 4 days secondary to diarrhea   Cycle 5 adjuvant Xeloda 04/16/2014, Xeloda dose reduced to 1000 mg in the a.m. and 500 mg in the p.m.   Cycle 6 adjuvant Xeloda 05/07/2014.  Cycle 7 adjuvant Xeloda 05/28/2014.  Cycle 8 adjuvant Xeloda 06/18/2014.  Restaging CTs on 07/14/2014 with no evidence of metastatic colon cancer  Colonoscopy 10/02/2014 with  diverticulosis in the sigmoid colon and in the descending colon. One 4 mm polyp in the sigmoid colon with complete resection. Polyp tissue not retrieved. Internal hemorrhoids. Next colonoscopy in 3 years for surveillance.  Restaging CTs 07/16/2015 with 2 new hypodense liver lesions concerning for metastases, no other evidence of disease progression  PET scan 07/28/2015 with a single hypermetabolic right liver lesion, no other evidence of metastatic disease  Ultrasound-guided biopsy of the right liver lesion 07/30/2015 confirmed metastatic colon cancer  MRI abdomen 08/20/2015 with similar to slight decrease in size of 2 hepatic metastasis. No new liver lesions or extrahepatic metastatic disease identified.  Radiofrequency ablation of 2 liver lesions 09/25/2015  MRI abdomen 02/02/2016-coagulative necrosis at the 2 ablated liver tumor sites,with no enhancement to suggest residual or recurrent liver tumor. No new liver masses or other findings of metastatic disease in the abdomen.  MRI abdomen 08/10/2016 revealed stable ablation sites, no evidence of progressive metastatic disease  Restaging CT 02/08/2017-stable treated liver lesions, no evidence of progressive metastatic disease, stable borderline enlarged right inguinal node  CEA with stable mild elevation 02/08/2017, 03/01/2017, 03/29/2017,04/19/2017  CEA further elevated 08/21/2017  CTs 09/05/2017-possible local recurrence of tumor along the ablation site right hepatic lobe lesion. Cephalad to the ablation site approximately 1.9 x 2.1cmsoft tissue mass progressive compared to the prior exam.  Liver SBRT 10/17/2017, 10/19/2017, 10/23/2017, 10/25/2017, 10/27/2017  CT 02/19/2018- progressive ill-defined areas of low attenuation with enhancement surrounding the segment 6 ablation site, stable segment 8 ablation site enlargement of extracapsular lesion posteriorly, no new hepatic  lesions, wall thickening/edema at the distal ileum  CT 05/22/2018- 2 new  lung nodules suspicious for metastases, stable segment 8 ablation zone, slight decrease in the size of delayed perfusion at the segment 6 ablation zone, posterior extracapsular lesion is slightly smaller  CT chest 07/13/2018- multiple lung nodules consistent with metastatic disease  Cycle 1 Xeloda 07/23/2018  Cycle 2 Xeloda 08/13/2018  Cycle 3 Xeloda 09/03/2018 2. Right ovary cystadenofibroma, status post a bilateral oophorectomy 10/22/2013 3. Remote history of cervical cancer. 4. Iron deficiency anemia-resolved. 5. Right calf deep vein thrombosis 10/24/2013-she completed 3 months of xarelto. 6. Indeterminate 12 mm hypermetabolic right pelvic density on the staging PET scan 10/04/2013-CT followup recommended per the GI tumor conference 11/13/2013. No longer visualized on a CT 07/14/2014. 7. Soft tissue implant overlying the posterior right liver on the CT 84/21/0312-OFV hypermetabolic on the PET scan 88/67/7373. 8. C. difficile colitis 03/05/2014. She completed a course of Flagyl, recurrent diarrhea beginning 03/21/2014, positive C. difficile toxin 03/26/2014-resumed Flagyl for planned 10 day course.  Vancomycin beginning 04/08/2014.  Taper complete 05/21/2014.  9. CVA May 2016 10. Skin infection at the right first MTP joint-Keflex prescribed 08/06/2015 11. Guillan-Barre syndrome February 2017 12. Left lower extremity DVTs March 2017-treated with Xarelto 13.  Right chest wall mass noted 07/16/2018   Disposition: Cynthia [name] appears stable.  She has completed 2 cycles of Xeloda.  Plan to proceed with cycle 3 as scheduled beginning 09/03/2018.  The plan is for restaging CTs after she has completed 4 cycles.  We reviewed the CBC from today.  Counts are adequate to proceed with treatment as above.  She will return for lab and follow-up in 3 weeks.  She will contact the office in the interim with any problems.  Plan reviewed with Dr. Benay [name].   Cynthia [name] ANP/GNP-BC    08/30/2018  3:45 PM

## 2018-08-31 MED FILL — CAPECITABINE 500 MG TABS: 500 | 21 days supply | Qty: 56 | Fill #0

## 2018-09-14 ENCOUNTER — Other Ambulatory Visit: Payer: Self-pay | Admitting: Oncology

## 2018-09-14 DIAGNOSIS — C787 Secondary malignant neoplasm of liver and intrahepatic bile duct: Principal | ICD-10-CM

## 2018-09-14 DIAGNOSIS — C189 Malignant neoplasm of colon, unspecified: Secondary | ICD-10-CM

## 2018-09-19 ENCOUNTER — Telehealth: Payer: Self-pay

## 2018-09-19 ENCOUNTER — Inpatient Hospital Stay: Payer: Medicare Other | Admitting: Oncology

## 2018-09-19 ENCOUNTER — Inpatient Hospital Stay: Payer: Medicare Other | Attending: Oncology

## 2018-09-19 VITALS — BP 152/81 | HR 73 | Temp 98.2°F | Resp 18 | Ht 61.0 in | Wt 141.1 lb

## 2018-09-19 DIAGNOSIS — R222 Localized swelling, mass and lump, trunk: Secondary | ICD-10-CM | POA: Diagnosis not present

## 2018-09-19 DIAGNOSIS — Z8673 Personal history of transient ischemic attack (TIA), and cerebral infarction without residual deficits: Secondary | ICD-10-CM | POA: Diagnosis not present

## 2018-09-19 DIAGNOSIS — C189 Malignant neoplasm of colon, unspecified: Secondary | ICD-10-CM

## 2018-09-19 DIAGNOSIS — C787 Secondary malignant neoplasm of liver and intrahepatic bile duct: Secondary | ICD-10-CM | POA: Diagnosis not present

## 2018-09-19 DIAGNOSIS — Z86718 Personal history of other venous thrombosis and embolism: Secondary | ICD-10-CM

## 2018-09-19 DIAGNOSIS — C7800 Secondary malignant neoplasm of unspecified lung: Secondary | ICD-10-CM | POA: Insufficient documentation

## 2018-09-19 DIAGNOSIS — C180 Malignant neoplasm of cecum: Secondary | ICD-10-CM | POA: Diagnosis not present

## 2018-09-19 LAB — CBC WITH DIFFERENTIAL (CANCER CENTER ONLY)
Abs Immature Granulocytes: 0.01 10*3/uL (ref 0.00–0.07)
Basophils Absolute: 0 10*3/uL (ref 0.0–0.1)
Basophils Relative: 0 %
Eosinophils Absolute: 0.2 10*3/uL (ref 0.0–0.5)
Eosinophils Relative: 3 %
HCT: 33.6 % — ABNORMAL LOW (ref 36.0–46.0)
Hemoglobin: 10.7 g/dL — ABNORMAL LOW (ref 12.0–15.0)
IMMATURE GRANULOCYTES: 0 %
Lymphocytes Relative: 20 %
Lymphs Abs: 1.2 10*3/uL (ref 0.7–4.0)
MCH: 32.1 pg (ref 26.0–34.0)
MCHC: 31.8 g/dL (ref 30.0–36.0)
MCV: 100.9 fL — ABNORMAL HIGH (ref 80.0–100.0)
Monocytes Absolute: 0.6 10*3/uL (ref 0.1–1.0)
Monocytes Relative: 10 %
Neutro Abs: 3.8 10*3/uL (ref 1.7–7.7)
Neutrophils Relative %: 67 %
Platelet Count: 230 10*3/uL (ref 150–400)
RBC: 3.33 MIL/uL — ABNORMAL LOW (ref 3.87–5.11)
RDW: 20 % — AB (ref 11.5–15.5)
WBC Count: 5.8 10*3/uL (ref 4.0–10.5)
nRBC: 0 % (ref 0.0–0.2)

## 2018-09-19 LAB — CMP (CANCER CENTER ONLY)
ALT: 11 U/L (ref 0–44)
AST: 20 U/L (ref 15–41)
Albumin: 3.3 g/dL — ABNORMAL LOW (ref 3.5–5.0)
Alkaline Phosphatase: 56 U/L (ref 38–126)
Anion gap: 6 (ref 5–15)
BUN: 14 mg/dL (ref 8–23)
CO2: 29 mmol/L (ref 22–32)
Calcium: 9 mg/dL (ref 8.9–10.3)
Chloride: 107 mmol/L (ref 98–111)
Creatinine: 0.96 mg/dL (ref 0.44–1.00)
GFR, Est AFR Am: >60 mL/min (ref >60)
GFR, Estimated: 56 mL/min — ABNORMAL LOW (ref >60)
Glucose, Bld: 97 mg/dL (ref 70–99)
Potassium: 4.5 mmol/L (ref 3.5–5.1)
Sodium: 142 mmol/L (ref 135–145)
Total Bilirubin: 0.4 mg/dL (ref 0.3–1.2)
Total Protein: 6.3 g/dL — ABNORMAL LOW (ref 6.5–8.1)

## 2018-09-19 MED FILL — CAPECITABINE 500 MG TABS: 500 | 21 days supply | Qty: 56 | Fill #0

## 2018-09-19 NOTE — Progress Notes (Signed)
Cynthia [name] OFFICE PROGRESS NOTE   Diagnosis: Colon cancer  INTERVAL HISTORY:   Cynthia [name] began another cycle of Xeloda on 09/03/2018.  No mouth sores, nausea, diarrhea, or hand/foot pain.  No complaint.  Objective:  Vital signs in last 24 hours:  Blood pressure (!) 152/81, pulse 73, temperature 98.2 F (36.8 C), temperature source Oral, resp. rate 18, height 5' 1"  (1.549 m), weight 141 lb 1.6 oz (64 kg), SpO2 99 %.    HEENT: No thrush or ulcers Resp: End inspiratory rales at the right posterior base, no respiratory distress Cardio: Regular rate and rhythm GI: No hepatomegaly, nontender Vascular: Trace lower leg edema bilaterally  Skin: Palms without erythema   Lab Results:  Lab Results  Component Value Date   WBC 5.8 09/19/2018   HGB 10.7 (L) 09/19/2018   HCT 33.6 (L) 09/19/2018   MCV 100.9 (H) 09/19/2018   PLT 230 09/19/2018   NEUTROABS 3.8 09/19/2018    CMP  Lab Results  Component Value Date   NA 142 08/30/2018   K 3.7 08/30/2018   CL 106 08/30/2018   CO2 30 08/30/2018   GLUCOSE 132 (H) 08/30/2018   BUN 16 08/30/2018   CREATININE 0.92 08/30/2018   CALCIUM 8.9 08/30/2018   PROT 6.9 08/30/2018   ALBUMIN 3.7 08/30/2018   AST 21 08/30/2018   ALT 11 08/30/2018   ALKPHOS 58 08/30/2018   BILITOT 0.6 08/30/2018   GFRNONAA 59 (L) 08/30/2018   GFRAA >60 08/30/2018    Lab Results  Component Value Date   CEA1 2.79 07/16/2018     Medications: I have reviewed the patient's current medications.   Assessment/Plan: 1. Moderately differentiated adenocarcinoma of the cecum, stage III (T4a, N1), status post a laparoscopic assisted right colectomy 10/22/2013  The tumor returned microsatellite stable with normal mismatch repair protein expression   Cycle 1 adjuvant Xeloda 12/25/2013.   Cycle 2 adjuvant Xeloda 01/15/2014.   Cycle 3 adjuvant Xeloda 02/05/2014.   Cycle 4 adjuvant Xeloda 03/18/2014, discontinued after 4 days secondary to  diarrhea   Cycle 5 adjuvant Xeloda 04/16/2014, Xeloda dose reduced to 1000 mg in the a.m. and 500 mg in the p.m.   Cycle 6 adjuvant Xeloda 05/07/2014.  Cycle 7 adjuvant Xeloda 05/28/2014.  Cycle 8 adjuvant Xeloda 06/18/2014.  Restaging CTs on 07/14/2014 with no evidence of metastatic colon cancer  Colonoscopy 10/02/2014 with diverticulosis in the sigmoid colon and in the descending colon. One 4 mm polyp in the sigmoid colon with complete resection. Polyp tissue not retrieved. Internal hemorrhoids. Next colonoscopy in 3 years for surveillance.  Restaging CTs 07/16/2015 with 2 new hypodense liver lesions concerning for metastases, no other evidence of disease progression  PET scan 07/28/2015 with a single hypermetabolic right liver lesion, no other evidence of metastatic disease  Ultrasound-guided biopsy of the right liver lesion 07/30/2015 confirmed metastatic colon cancer  MRI abdomen 08/20/2015 with similar to slight decrease in size of 2 hepatic metastasis. No new liver lesions or extrahepatic metastatic disease identified.  Radiofrequency ablation of 2 liver lesions 09/25/2015  MRI abdomen 02/02/2016-coagulative necrosis at the 2 ablated liver tumor sites,with no enhancement to suggest residual or recurrent liver tumor. No new liver masses or other findings of metastatic disease in the abdomen.  MRI abdomen 08/10/2016 revealed stable ablation sites, no evidence of progressive metastatic disease  Restaging CT 02/08/2017-stable treated liver lesions, no evidence of progressive metastatic disease, stable borderline enlarged right inguinal node  CEA with stable mild elevation 02/08/2017, 03/01/2017,  03/29/2017,04/19/2017  CEA further elevated 08/21/2017  CTs 09/05/2017-possible local recurrence of tumor along the ablation site right hepatic lobe lesion. Cephalad to the ablation site approximately 1.9 x 2.1cmsoft tissue mass progressive compared to the prior exam.  Liver SBRT  10/17/2017, 10/19/2017, 10/23/2017, 10/25/2017, 10/27/2017  CT 02/19/2018- progressive ill-defined areas of low attenuation with enhancement surrounding the segment 6 ablation site, stable segment 8 ablation site enlargement of extracapsular lesion posteriorly, no new hepatic lesions, wall thickening/edema at the distal ileum  CT 05/22/2018- 2 new lung nodules suspicious for metastases, stable segment 8 ablation zone, slight decrease in the size of delayed perfusion at the segment 6 ablation zone, posterior extracapsular lesion is slightly smaller  CT chest 07/13/2018- multiple lung nodules consistent with metastatic disease  Cycle 1 Xeloda 07/23/2018  Cycle 2 Xeloda 08/13/2018  Cycle 3 Xeloda 09/03/2018  Cycle 4 Xeloda 09/24/2018 2. Right ovary cystadenofibroma, status post a bilateral oophorectomy 10/22/2013 3. Remote history of cervical cancer. 4. Iron deficiency anemia-resolved. 5. Right calf deep vein thrombosis 10/24/2013-she completed 3 months of xarelto. 6. Indeterminate 12 mm hypermetabolic right pelvic density on the staging PET scan 10/04/2013-CT followup recommended per the GI tumor conference 11/13/2013. No longer visualized on a CT 07/14/2014. 7. Soft tissue implant overlying the posterior right liver on the CT 17/07/7870-UDO hypermetabolic on the PET scan 72/55/0016. 8. C. difficile colitis 03/05/2014. She completed a course of Flagyl, recurrent diarrhea beginning 03/21/2014, positive C. difficile toxin 03/26/2014-resumed Flagyl for planned 10 day course.  Vancomycin beginning 04/08/2014.  Taper complete 05/21/2014.  9. CVA May 2016 10. Skin infection at the right first MTP joint-Keflex prescribed 08/06/2015 11. Guillan-Barre syndrome February 2017 12. Left lower extremity DVTs March 2017-treated with Xarelto 13.  Right chest wall mass noted 07/16/2018     Disposition: [name] appears stable.  She is tolerating the Xeloda well.  She will complete another cycle  beginning 05/2019.  She will return for an office visit and restaging chest CT on 10/12/2018.  15 minutes were spent with the patient today.  The majority of the time was used for counseling and coordination of care.  [name], MD  09/19/2018  10:44 AM

## 2018-09-19 NOTE — Telephone Encounter (Signed)
Printed avs and calender of upcoming appointment. Per 2/5 los 

## 2018-10-11 ENCOUNTER — Ambulatory Visit (HOSPITAL_COMMUNITY): Admission: RE | Admit: 2018-10-11 | Payer: Medicare Other | Source: Ambulatory Visit

## 2018-10-12 ENCOUNTER — Inpatient Hospital Stay: Payer: Medicare Other

## 2018-10-12 ENCOUNTER — Encounter: Payer: Self-pay | Admitting: Nurse Practitioner

## 2018-10-12 ENCOUNTER — Inpatient Hospital Stay: Payer: Medicare Other | Admitting: Nurse Practitioner

## 2018-10-12 ENCOUNTER — Ambulatory Visit (HOSPITAL_COMMUNITY)
Admission: RE | Admit: 2018-10-12 | Discharge: 2018-10-12 | Disposition: A | Discharge status: Home / Self Care | Payer: Medicare Other | Source: Ambulatory Visit | Attending: Oncology | Admitting: Oncology

## 2018-10-12 ENCOUNTER — Telehealth: Payer: Self-pay | Admitting: Nurse Practitioner

## 2018-10-12 ENCOUNTER — Other Ambulatory Visit: Payer: Self-pay

## 2018-10-12 VITALS — BP 157/91 | HR 67 | Temp 97.4°F | Resp 18 | Ht 61.0 in | Wt 141.2 lb

## 2018-10-12 DIAGNOSIS — C7800 Secondary malignant neoplasm of unspecified lung: Secondary | ICD-10-CM

## 2018-10-12 DIAGNOSIS — R222 Localized swelling, mass and lump, trunk: Secondary | ICD-10-CM | POA: Diagnosis not present

## 2018-10-12 DIAGNOSIS — C180 Malignant neoplasm of cecum: Secondary | ICD-10-CM | POA: Diagnosis not present

## 2018-10-12 DIAGNOSIS — Z8673 Personal history of transient ischemic attack (TIA), and cerebral infarction without residual deficits: Secondary | ICD-10-CM

## 2018-10-12 DIAGNOSIS — C787 Secondary malignant neoplasm of liver and intrahepatic bile duct: Secondary | ICD-10-CM | POA: Insufficient documentation

## 2018-10-12 DIAGNOSIS — C189 Malignant neoplasm of colon, unspecified: Secondary | ICD-10-CM

## 2018-10-12 DIAGNOSIS — Z86718 Personal history of other venous thrombosis and embolism: Secondary | ICD-10-CM

## 2018-10-12 LAB — CMP (CANCER CENTER ONLY)
ALT: 9 U/L (ref 0–44)
AST: 21 U/L (ref 15–41)
Albumin: 3.7 g/dL (ref 3.5–5.0)
Alkaline Phosphatase: 64 U/L (ref 38–126)
Anion gap: 9 (ref 5–15)
BUN: 14 mg/dL (ref 8–23)
CO2: 28 mmol/L (ref 22–32)
Calcium: 9.4 mg/dL (ref 8.9–10.3)
Chloride: 106 mmol/L (ref 98–111)
Creatinine: 0.9 mg/dL (ref 0.44–1.00)
GFR, Est AFR Am: >60 mL/min (ref >60)
Glucose, Bld: 86 mg/dL (ref 70–99)
POTASSIUM: 4.2 mmol/L (ref 3.5–5.1)
Sodium: 143 mmol/L (ref 135–145)
Total Bilirubin: 0.4 mg/dL (ref 0.3–1.2)
Total Protein: 6.8 g/dL (ref 6.5–8.1)

## 2018-10-12 LAB — CBC WITH DIFFERENTIAL (CANCER CENTER ONLY)
Abs Immature Granulocytes: 0.01 10*3/uL (ref 0.00–0.07)
Basophils Absolute: 0 10*3/uL (ref 0.0–0.1)
Basophils Relative: 0 %
Eosinophils Absolute: 0.2 10*3/uL (ref 0.0–0.5)
Eosinophils Relative: 3 %
HCT: 35 % — ABNORMAL LOW (ref 36.0–46.0)
Hemoglobin: 11.2 g/dL — ABNORMAL LOW (ref 12.0–15.0)
Immature Granulocytes: 0 %
Lymphocytes Relative: 23 %
Lymphs Abs: 1.5 10*3/uL (ref 0.7–4.0)
MCH: 33.2 pg (ref 26.0–34.0)
MCHC: 32 g/dL (ref 30.0–36.0)
MCV: 103.9 fL — AB (ref 80.0–100.0)
Monocytes Absolute: 0.7 10*3/uL (ref 0.1–1.0)
Monocytes Relative: 10 %
Neutro Abs: 4.1 10*3/uL (ref 1.7–7.7)
Neutrophils Relative %: 64 %
PLATELETS: 212 10*3/uL (ref 150–400)
RBC: 3.37 MIL/uL — ABNORMAL LOW (ref 3.87–5.11)
RDW: 22.3 % — ABNORMAL HIGH (ref 11.5–15.5)
WBC: 6.5 10*3/uL (ref 4.0–10.5)
nRBC: 0 % (ref 0.0–0.2)

## 2018-10-12 MED ORDER — CAPECITABINE 500 MG PO TABS: 1000.0000 mg | ORAL_TABLET | Freq: Two times a day (BID) | ORAL | 0 refills | Status: DC

## 2018-10-12 NOTE — Progress Notes (Addendum)
[facility] OFFICE PROGRESS NOTE   Diagnosis: Colon cancer  INTERVAL HISTORY:   Cynthia [name] returns as scheduled.  She completed cycle 4 Xeloda beginning 09/24/2018.  She denies nausea/vomiting.  No mouth sores.  No diarrhea.  No hand or foot pain or redness.  The skin change at the right lateral chest is stable to improved.  No significant associated pain.  No fever.  Objective:  Vital signs in last 24 hours:  Blood pressure (!) 157/91, pulse 67, temperature (!) 97.4 F (36.3 C), temperature source Oral, resp. rate 18, height _0  (1.549 m), weight 141 lb 3.2 oz (64 kg), SpO2 99 %.    HEENT: No thrush or ulcers. Resp: Lungs clear bilaterally. Cardio: Regular rate and rhythm. GI: No hepatomegaly. Vascular: No leg edema. Musculoskeletal: Right lateral abdomen with skin thickening/mild erythema. Skin: Palms with mild erythema.   Lab Results:  Lab Results  Component Value Date   WBC 5.8 09/19/2018   HGB 10.7 (L) 09/19/2018   HCT 33.6 (L) 09/19/2018   MCV 100.9 (H) 09/19/2018   PLT 230 09/19/2018   NEUTROABS 3.8 09/19/2018    Imaging:  Ct Chest Wo Contrast  Result Date: 10/12/2018 CLINICAL DATA:  Colon cancer with liver and lung metastases EXAM: CT CHEST WITHOUT CONTRAST TECHNIQUE: Multidetector CT imaging of the chest was performed following the standard protocol without IV contrast. COMPARISON:  CT chest dated 07/13/2018 FINDINGS: Cardiovascular: Heart is normal in size.  No pericardial effusion. No evidence of thoracic aortic aneurysm. Atherosclerotic calcifications of the aortic arch. Three vessel coronary atherosclerosis. Mediastinum/Nodes: No suspicious mediastinal lymphadenopathy. Visualized thyroid is unremarkable. Lungs/Pleura: Scattered small bilateral pulmonary nodules, mildly improved. Dominant 10 x 13 mm nodule posteriorly in the right middle lobe (series 5/image 19) previously measured 11 x 14 mm. Additional 5 mm nodule in the anterior left upper  lobe (series 5/image 93) previously measured 6 mm. Mild linear scarring in the right lower lobe. No focal consolidation. No pleural effusion or pneumothorax. Upper Abdomen: Visualized upper abdomen is notable for a 2.7 cm hypodense lesion in segment 6 of the liver (series 2/image 155), grossly unchanged, likely corresponding to the site of prior liver ablation. Additional left hepatic lobe cysts measuring up to 6.2 cm, benign. Vascular calcifications. Suture line in the right colon (series 2/image 166). Musculoskeletal: Mild degenerative changes of the mid thoracic spine. IMPRESSION: Scattered pulmonary nodules/metastases, mildly improved, including a dominant 10 x 13 mm nodule posteriorly in the right middle lobe. Aortic Atherosclerosis (ICD10-I70.0). Electronically Signed   By: Julian Hy M.D.   On: 10/12/2018 14:48    Medications: I have reviewed the patient's current medications.  Assessment/Plan: 1. Moderately differentiated adenocarcinoma of the cecum, stage III (T4a, N1), status post a laparoscopic assisted right colectomy 10/22/2013  The tumor returned microsatellite stable with normal mismatch repair protein expression   Cycle 1 adjuvant Xeloda 12/25/2013.   Cycle 2 adjuvant Xeloda 01/15/2014.   Cycle 3 adjuvant Xeloda 02/05/2014.   Cycle 4 adjuvant Xeloda 03/18/2014, discontinued after 4 days secondary to diarrhea   Cycle 5 adjuvant Xeloda 04/16/2014, Xeloda dose reduced to 1000 mg in the a.m. and 500 mg in the p.m.   Cycle 6 adjuvant Xeloda 05/07/2014.  Cycle 7 adjuvant Xeloda 05/28/2014.  Cycle 8 adjuvant Xeloda 06/18/2014.  Restaging CTs on 07/14/2014 with no evidence of metastatic colon cancer  Colonoscopy 10/02/2014 with diverticulosis in the sigmoid colon and in the descending colon. One 4 mm polyp in the sigmoid colon with complete  resection. Polyp tissue not retrieved. Internal hemorrhoids. Next colonoscopy in 3 years for surveillance.  Restaging CTs  07/16/2015 with 2 new hypodense liver lesions concerning for metastases, no other evidence of disease progression  PET scan 07/28/2015 with a single hypermetabolic right liver lesion, no other evidence of metastatic disease  Ultrasound-guided biopsy of the right liver lesion 07/30/2015 confirmed metastatic colon cancer  MRI abdomen 08/20/2015 with similar to slight decrease in size of 2 hepatic metastasis. No new liver lesions or extrahepatic metastatic disease identified.  Radiofrequency ablation of 2 liver lesions 09/25/2015  MRI abdomen 02/02/2016-coagulative necrosis at the 2 ablated liver tumor sites,with no enhancement to suggest residual or recurrent liver tumor. No new liver masses or other findings of metastatic disease in the abdomen.  MRI abdomen 08/10/2016 revealed stable ablation sites, no evidence of progressive metastatic disease  Restaging CT 02/08/2017-stable treated liver lesions, no evidence of progressive metastatic disease, stable borderline enlarged right inguinal node  CEA with stable mild elevation 02/08/2017, 03/01/2017, 03/29/2017,04/19/2017  CEA further elevated 08/21/2017  CTs 09/05/2017-possible local recurrence of tumor along the ablation site right hepatic lobe lesion. Cephalad to the ablation site approximately 1.9 x 2.1cmsoft tissue mass progressive compared to the prior exam.  Liver SBRT 10/17/2017, 10/19/2017, 10/23/2017, 10/25/2017, 10/27/2017  CT 02/19/2018- progressive ill-defined areas of low attenuation with enhancement surrounding the segment 6 ablation site, stable segment 8 ablation site enlargement of extracapsular lesion posteriorly, no new hepatic lesions, wall thickening/edema at the distal ileum  CT 05/22/2018-2 new lung nodules suspicious for metastases, stable segment 8 ablation zone, slight decrease in the size of delayed perfusion at the segment 6 ablation zone, posterior extracapsular lesion is slightly smaller  CT chest 07/13/2018- multiple  lung nodules consistent with metastatic disease  Cycle 1 Xeloda 07/23/2018  Cycle 2 Xeloda 08/13/2018  Cycle 3 Xeloda 09/03/2018  Cycle 4 Xeloda 09/24/2018  Restaging chest CT 10/12/2018- scattered pulmonary nodules/metastases mildly improved.  Cycle 5 Xeloda 10/15/2018 2. Right ovary cystadenofibroma, status post a bilateral oophorectomy 10/22/2013 3. Remote history of cervical cancer. 4. Iron deficiency anemia-resolved. 5. Right calf deep vein thrombosis 10/24/2013-she completed 3 months of xarelto. 6. Indeterminate 12 mm hypermetabolic right pelvic density on the staging PET scan 10/04/2013-CT followup recommended per the GI tumor conference 11/13/2013. No longer visualized on a CT 07/14/2014. 7. Soft tissue implant overlying the posterior right liver on the CT 27/51/7001-VCB hypermetabolic on the PET scan 44/96/7591. 8. C. difficile colitis 03/05/2014. She completed a course of Flagyl, recurrent diarrhea beginning 03/21/2014, positive C. difficile toxin 03/26/2014-resumed Flagyl for planned 10 day course.  Vancomycin beginning 04/08/2014.  Taper complete 05/21/2014.  9. CVA May 2016 10. Skin infection at the right first MTP joint-Keflex prescribed 08/06/2015 11. Guillan-Barre syndrome February 2017 12. Left lower extremity DVTs March 2017-treated with Xarelto 13. Right chest wall mass noted 07/16/2018   Disposition: Ms. Dymond appears stable.  She has completed 4 cycles of Xeloda.  Recent restaging chest CT shows mild improvement in lung nodules.  Plan to continue Xeloda.  She will begin the next cycle and schedule on 10/15/2018.  The area of skin thickening/erythema at the right lateral chest may represent tumor.  We will continue to monitor.  She will return to the lab today for CBC, chemistry panel and CEA.  She will return for lab and follow-up in 3 weeks.  She will contact the office in the interim with any problems.  Patient seen with Dr. Benay Spice.  Ned Card  ANP/GNP-BC   10/12/2018  3:35 PM  This was a shared visit with Ned Card.  Ms. Forner was interviewed and examined.  We discussed the CT findings and reviewed the CT images.  I am concerned the induration at the right chest wall is related to metastatic colon cancer.  This appears unchanged .  The plan is to continue Xeloda.  We will arrange for biopsy of the chest wall induration if the area enlarges.  Julieanne Manson, MD

## 2018-10-12 NOTE — Telephone Encounter (Signed)
Scheduled appt per 02/28 los. ° °Printed calendar and avs. °

## 2018-10-15 LAB — CEA (IN HOUSE-CHCC): CEA (CHCC-IN HOUSE): 4.3 ng/mL (ref 0.00–5.00)

## 2018-10-15 MED FILL — CAPECITABINE 500 MG TABS: 500 | 21 days supply | Qty: 56 | Fill #0

## 2018-10-28 ENCOUNTER — Other Ambulatory Visit: Payer: Self-pay | Admitting: Physical Medicine & Rehabilitation

## 2018-10-29 ENCOUNTER — Telehealth: Payer: Self-pay | Admitting: *Deleted

## 2018-10-29 NOTE — Telephone Encounter (Signed)
Patient left VM asking how to proceed on Wednesday? Has lab/OV and take Xeloda. Called back and left VM that per MD she should come to visit as scheduled and use good handwashing, social distancing. Patients and visitors are being screened at the front desk. Does not want her to delay chemotherapy, since we are not sure how long this situation will last with virus. All this information was left on her voice mail.

## 2018-10-31 ENCOUNTER — Encounter: Payer: Self-pay | Admitting: Medical

## 2018-10-31 ENCOUNTER — Telehealth: Payer: Self-pay | Admitting: Nurse Practitioner

## 2018-10-31 ENCOUNTER — Inpatient Hospital Stay: Payer: Medicare Other | Admitting: Nurse Practitioner

## 2018-10-31 ENCOUNTER — Inpatient Hospital Stay: Payer: Medicare Other | Attending: Oncology

## 2018-10-31 ENCOUNTER — Other Ambulatory Visit: Payer: Self-pay

## 2018-10-31 VITALS — BP 146/77 | HR 69 | Temp 97.7°F | Resp 19 | Ht 61.0 in | Wt 141.1 lb

## 2018-10-31 DIAGNOSIS — C189 Malignant neoplasm of colon, unspecified: Secondary | ICD-10-CM

## 2018-10-31 DIAGNOSIS — L271 Localized skin eruption due to drugs and medicaments taken internally: Secondary | ICD-10-CM

## 2018-10-31 DIAGNOSIS — Z86718 Personal history of other venous thrombosis and embolism: Secondary | ICD-10-CM | POA: Insufficient documentation

## 2018-10-31 DIAGNOSIS — C7800 Secondary malignant neoplasm of unspecified lung: Secondary | ICD-10-CM | POA: Insufficient documentation

## 2018-10-31 DIAGNOSIS — R222 Localized swelling, mass and lump, trunk: Secondary | ICD-10-CM | POA: Diagnosis not present

## 2018-10-31 DIAGNOSIS — C180 Malignant neoplasm of cecum: Secondary | ICD-10-CM | POA: Insufficient documentation

## 2018-10-31 DIAGNOSIS — C787 Secondary malignant neoplasm of liver and intrahepatic bile duct: Secondary | ICD-10-CM | POA: Insufficient documentation

## 2018-10-31 DIAGNOSIS — Z8673 Personal history of transient ischemic attack (TIA), and cerebral infarction without residual deficits: Secondary | ICD-10-CM

## 2018-10-31 LAB — CMP (CANCER CENTER ONLY)
ALBUMIN: 3.4 g/dL — AB (ref 3.5–5.0)
ALT: 11 U/L (ref 0–44)
AST: 19 U/L (ref 15–41)
Alkaline Phosphatase: 58 U/L (ref 38–126)
Anion gap: 9 (ref 5–15)
BUN: 14 mg/dL (ref 8–23)
CHLORIDE: 108 mmol/L (ref 98–111)
CO2: 24 mmol/L (ref 22–32)
Calcium: 8.7 mg/dL — ABNORMAL LOW (ref 8.9–10.3)
Creatinine: 0.95 mg/dL (ref 0.44–1.00)
GFR, Est AFR Am: >60 mL/min (ref >60)
GFR, Estimated: 57 mL/min — ABNORMAL LOW (ref >60)
Glucose, Bld: 95 mg/dL (ref 70–99)
Potassium: 4 mmol/L (ref 3.5–5.1)
Sodium: 141 mmol/L (ref 135–145)
Total Bilirubin: 0.6 mg/dL (ref 0.3–1.2)
Total Protein: 6.4 g/dL — ABNORMAL LOW (ref 6.5–8.1)

## 2018-10-31 LAB — CBC WITH DIFFERENTIAL (CANCER CENTER ONLY)
Abs Immature Granulocytes: 0.02 10*3/uL (ref 0.00–0.07)
Basophils Absolute: 0 10*3/uL (ref 0.0–0.1)
Basophils Relative: 0 %
Eosinophils Absolute: 0.1 10*3/uL (ref 0.0–0.5)
Eosinophils Relative: 3 %
HCT: 32.7 % — ABNORMAL LOW (ref 36.0–46.0)
HEMOGLOBIN: 10.8 g/dL — AB (ref 12.0–15.0)
IMMATURE GRANULOCYTES: 0 %
Lymphocytes Relative: 22 %
Lymphs Abs: 1.2 10*3/uL (ref 0.7–4.0)
MCH: 34.5 pg — ABNORMAL HIGH (ref 26.0–34.0)
MCHC: 33 g/dL (ref 30.0–36.0)
MCV: 104.5 fL — ABNORMAL HIGH (ref 80.0–100.0)
Monocytes Absolute: 0.6 10*3/uL (ref 0.1–1.0)
Monocytes Relative: 11 %
NEUTROS PCT: 64 %
Neutro Abs: 3.3 10*3/uL (ref 1.7–7.7)
Platelet Count: 195 10*3/uL (ref 150–400)
RBC: 3.13 MIL/uL — ABNORMAL LOW (ref 3.87–5.11)
RDW: 21.8 % — ABNORMAL HIGH (ref 11.5–15.5)
WBC Count: 5.2 10*3/uL (ref 4.0–10.5)
nRBC: 0 % (ref 0.0–0.2)

## 2018-10-31 MED ORDER — CAPECITABINE 500 MG PO TABS: 1000.0000 mg | ORAL_TABLET | Freq: Two times a day (BID) | ORAL | 0 refills | Status: DC

## 2018-10-31 MED FILL — CAPECITABINE 500 MG TABS: 500 | 21 days supply | Qty: 56 | Fill #0

## 2018-10-31 NOTE — Progress Notes (Signed)
[place] OFFICE PROGRESS NOTE   Diagnosis: Colon cancer  INTERVAL HISTORY:   Cynthia [name] returns as scheduled.  She completed cycle 5 Xeloda beginning 10/15/2018.  She denies nausea/vomiting.  No mouth sores.  No diarrhea.  She notes hands and feet are dry.  No associated pain.  She had some shortness of breath when raking leaves recently.  No associated symptoms.  Specifically no fever, cough, chest pain.  No further episodes of dyspnea.  She in general feels well.  She notes less pain, swelling, erythema associated skin changes at the right lateral chest.  Objective:  Vital signs in last 24 hours:  Blood pressure (!) 146/77, pulse 69, temperature 97.7 F (36.5 C), temperature source Oral, resp. rate 19, height _0  (1.549 m), weight 141 lb 1.6 oz (64 kg), SpO2 98 %.    HEENT: No thrush or ulcers. Resp: Lungs clear bilaterally. Cardio: Regular rate and rhythm. GI: Abdomen soft and nontender.  No hepatomegaly. Vascular: No leg edema. Musculoskeletal: Right lateral abdomen with skin thickening/mild erythema.  Overall appears improved. Skin: Palms and soles with mild erythema.  Tips of thumbs dry appearing.   Lab Results:  Lab Results  Component Value Date   WBC 5.2 10/31/2018   HGB 10.8 (L) 10/31/2018   HCT 32.7 (L) 10/31/2018   MCV 104.5 (H) 10/31/2018   PLT 195 10/31/2018   NEUTROABS 3.3 10/31/2018    Imaging:  No results found.  Medications: I have reviewed the patient's current medications.  Assessment/Plan: 1. Moderately differentiated adenocarcinoma of the cecum, stage III (T4a, N1), status post a laparoscopic assisted right colectomy 10/22/2013  The tumor returned microsatellite stable with normal mismatch repair protein expression   Cycle 1 adjuvant Xeloda 12/25/2013.   Cycle 2 adjuvant Xeloda 01/15/2014.   Cycle 3 adjuvant Xeloda 02/05/2014.   Cycle 4 adjuvant Xeloda 03/18/2014, discontinued after 4 days secondary to diarrhea    Cycle 5 adjuvant Xeloda 04/16/2014, Xeloda dose reduced to 1000 mg in the a.m. and 500 mg in the p.m.   Cycle 6 adjuvant Xeloda 05/07/2014.  Cycle 7 adjuvant Xeloda 05/28/2014.  Cycle 8 adjuvant Xeloda 06/18/2014.  Restaging CTs on 07/14/2014 with no evidence of metastatic colon cancer  Colonoscopy 10/02/2014 with diverticulosis in the sigmoid colon and in the descending colon. One 4 mm polyp in the sigmoid colon with complete resection. Polyp tissue not retrieved. Internal hemorrhoids. Next colonoscopy in 3 years for surveillance.  Restaging CTs 07/16/2015 with 2 new hypodense liver lesions concerning for metastases, no other evidence of disease progression  PET scan 07/28/2015 with a single hypermetabolic right liver lesion, no other evidence of metastatic disease  Ultrasound-guided biopsy of the right liver lesion 07/30/2015 confirmed metastatic colon cancer  MRI abdomen 08/20/2015 with similar to slight decrease in size of 2 hepatic metastasis. No new liver lesions or extrahepatic metastatic disease identified.  Radiofrequency ablation of 2 liver lesions 09/25/2015  MRI abdomen 02/02/2016-coagulative necrosis at the 2 ablated liver tumor sites,with no enhancement to suggest residual or recurrent liver tumor. No new liver masses or other findings of metastatic disease in the abdomen.  MRI abdomen 08/10/2016 revealed stable ablation sites, no evidence of progressive metastatic disease  Restaging CT 02/08/2017-stable treated liver lesions, no evidence of progressive metastatic disease, stable borderline enlarged right inguinal node  CEA with stable mild elevation 02/08/2017, 03/01/2017, 03/29/2017,04/19/2017  CEA further elevated 08/21/2017  CTs 09/05/2017-possible local recurrence of tumor along the ablation site right hepatic lobe lesion. Cephalad to the ablation  site approximately 1.9 x 2.1cmsoft tissue mass progressive compared to the prior exam.  Liver SBRT 10/17/2017,  10/19/2017, 10/23/2017, 10/25/2017, 10/27/2017  CT 02/19/2018- progressive ill-defined areas of low attenuation with enhancement surrounding the segment 6 ablation site, stable segment 8 ablation site enlargement of extracapsular lesion posteriorly, no new hepatic lesions, wall thickening/edema at the distal ileum  CT 05/22/2018-2 new lung nodules suspicious for metastases, stable segment 8 ablation zone, slight decrease in the size of delayed perfusion at the segment 6 ablation zone, posterior extracapsular lesion is slightly smaller  CT chest 07/13/2018-multiple lung nodules consistent with metastatic disease  Cycle 1 Xeloda 07/23/2018  Cycle 2 Xeloda 08/13/2018  Cycle 3 Xeloda 09/03/2018  Cycle 4 Xeloda 09/24/2018  Restaging chest CT 10/12/2018- scattered pulmonary nodules/metastases mildly improved.  Cycle 5 Xeloda 10/15/2018  Cycle 6 Xeloda 11/05/2018 2. Right ovary cystadenofibroma, status post a bilateral oophorectomy 10/22/2013 3. Remote history of cervical cancer. 4. Iron deficiency anemia-resolved. 5. Right calf deep vein thrombosis 10/24/2013-she completed 3 months of xarelto. 6. Indeterminate 12 mm hypermetabolic right pelvic density on the staging PET scan 10/04/2013-CT followup recommended per the GI tumor conference 11/13/2013. No longer visualized on a CT 07/14/2014. 7. Soft tissue implant overlying the posterior right liver on the CT 89/38/1017-PZW hypermetabolic on the PET scan 25/85/2778. 8. C. difficile colitis 03/05/2014. She completed a course of Flagyl, recurrent diarrhea beginning 03/21/2014, positive C. difficile toxin 03/26/2014-resumed Flagyl for planned 10 day course.  Vancomycin beginning 04/08/2014.  Taper complete 05/21/2014.  Disposition: Cynthia [name] appears stable.  She has completed 5 cycles of Xeloda.  There is no clinical evidence of disease progression.  She is tolerating the Xeloda well overall.  Plan to proceed with cycle 6 as scheduled beginning  11/05/2018.    She has mild changes of hand-foot syndrome.  She understands to contact the office with worsening symptoms.  We reviewed the CBC from today.  Counts adequate to continue with treatment as above.  She will return for lab and follow-up in 3 weeks.    Ned Card ANP/GNP-BC   10/31/2018  9:18 AM

## 2018-10-31 NOTE — Telephone Encounter (Signed)
Gave avs and calendar ° °

## 2018-11-21 ENCOUNTER — Other Ambulatory Visit: Payer: Self-pay

## 2018-11-21 ENCOUNTER — Inpatient Hospital Stay: Payer: Medicare Other

## 2018-11-21 ENCOUNTER — Inpatient Hospital Stay: Payer: Medicare Other | Attending: Oncology | Admitting: Oncology

## 2018-11-21 ENCOUNTER — Telehealth: Payer: Self-pay | Admitting: Oncology

## 2018-11-21 DIAGNOSIS — C180 Malignant neoplasm of cecum: Secondary | ICD-10-CM | POA: Diagnosis not present

## 2018-11-21 DIAGNOSIS — Z79899 Other long term (current) drug therapy: Secondary | ICD-10-CM | POA: Insufficient documentation

## 2018-11-21 DIAGNOSIS — C189 Malignant neoplasm of colon, unspecified: Secondary | ICD-10-CM

## 2018-11-21 DIAGNOSIS — C787 Secondary malignant neoplasm of liver and intrahepatic bile duct: Secondary | ICD-10-CM | POA: Diagnosis not present

## 2018-11-21 DIAGNOSIS — Z8541 Personal history of malignant neoplasm of cervix uteri: Secondary | ICD-10-CM | POA: Diagnosis not present

## 2018-11-21 DIAGNOSIS — C7800 Secondary malignant neoplasm of unspecified lung: Secondary | ICD-10-CM | POA: Diagnosis not present

## 2018-11-21 LAB — CMP (CANCER CENTER ONLY)
ALT: 12 U/L (ref 0–44)
AST: 21 U/L (ref 15–41)
Albumin: 3.6 g/dL (ref 3.5–5.0)
Alkaline Phosphatase: 59 U/L (ref 38–126)
Anion gap: 7 (ref 5–15)
BUN: 13 mg/dL (ref 8–23)
CO2: 27 mmol/L (ref 22–32)
Calcium: 9 mg/dL (ref 8.9–10.3)
Chloride: 108 mmol/L (ref 98–111)
Creatinine: 0.96 mg/dL (ref 0.44–1.00)
GFR, Est AFR Am: >60 mL/min (ref >60)
GFR, Estimated: 56 mL/min — ABNORMAL LOW (ref >60)
Glucose, Bld: 76 mg/dL (ref 70–99)
Potassium: 4.4 mmol/L (ref 3.5–5.1)
Sodium: 142 mmol/L (ref 135–145)
Total Bilirubin: 0.7 mg/dL (ref 0.3–1.2)
Total Protein: 6.6 g/dL (ref 6.5–8.1)

## 2018-11-21 LAB — CBC WITH DIFFERENTIAL (CANCER CENTER ONLY)
Abs Immature Granulocytes: 0.01 10*3/uL (ref 0.00–0.07)
Basophils Absolute: 0 10*3/uL (ref 0.0–0.1)
Basophils Relative: 1 %
Eosinophils Absolute: 0.1 10*3/uL (ref 0.0–0.5)
Eosinophils Relative: 2 %
HCT: 33.3 % — ABNORMAL LOW (ref 36.0–46.0)
Hemoglobin: 11 g/dL — ABNORMAL LOW (ref 12.0–15.0)
Immature Granulocytes: 0 %
Lymphocytes Relative: 23 %
Lymphs Abs: 1.3 10*3/uL (ref 0.7–4.0)
MCH: 35.7 pg — ABNORMAL HIGH (ref 26.0–34.0)
MCHC: 33 g/dL (ref 30.0–36.0)
MCV: 108.1 fL — ABNORMAL HIGH (ref 80.0–100.0)
Monocytes Absolute: 0.5 10*3/uL (ref 0.1–1.0)
Monocytes Relative: 9 %
Neutro Abs: 3.8 10*3/uL (ref 1.7–7.7)
Neutrophils Relative %: 65 %
Platelet Count: 185 10*3/uL (ref 150–400)
RBC: 3.08 MIL/uL — ABNORMAL LOW (ref 3.87–5.11)
RDW: 20.2 % — ABNORMAL HIGH (ref 11.5–15.5)
WBC Count: 5.8 10*3/uL (ref 4.0–10.5)
nRBC: 0 % (ref 0.0–0.2)

## 2018-11-21 LAB — CEA (IN HOUSE-CHCC): CEA (CHCC-In House): 2.81 ng/mL (ref 0.00–5.00)

## 2018-11-21 NOTE — Progress Notes (Signed)
[place] OFFICE VISIT PROGRESS NOTE  I connected with Cynthia [name]  on 11/21/18 at 12:45 PM EDT by  and verified that I am speaking with the correct person using two identifiers.    Patient's location: Home Provider's location: Office   Diagnosis: Colon cancer  INTERVAL HISTORY:   Cynthia [name] completed another cycle of Xeloda beginning 11/05/2018.  No mouth sores.  She feels well.  Good appetite.  She had paronychia at the left toe and a left finger.  These areas are healing.  She has noted sun sensitivity.  She has intermittent diarrhea lasting for several hours on 1 day.  Objective:    Lab Results:  Lab Results  Component Value Date   WBC 5.8 11/21/2018   HGB 11.0 (L) 11/21/2018   HCT 33.3 (L) 11/21/2018   MCV 108.1 (H) 11/21/2018   PLT 185 11/21/2018   NEUTROABS 3.8 11/21/2018     Medications: I have reviewed the patient's current medications.  Assessment/Plan: 1. Moderately differentiated adenocarcinoma of the cecum, stage III (T4a, N1), status post a laparoscopic assisted right colectomy 10/22/2013  The tumor returned microsatellite stable with normal mismatch repair protein expression   Cycle 1 adjuvant Xeloda 12/25/2013.   Cycle 2 adjuvant Xeloda 01/15/2014.   Cycle 3 adjuvant Xeloda 02/05/2014.   Cycle 4 adjuvant Xeloda 03/18/2014, discontinued after 4 days secondary to diarrhea   Cycle 5 adjuvant Xeloda 04/16/2014, Xeloda dose reduced to 1000 mg in the a.m. and 500 mg in the p.m.   Cycle 6 adjuvant Xeloda 05/07/2014.  Cycle 7 adjuvant Xeloda 05/28/2014.  Cycle 8 adjuvant Xeloda 06/18/2014.  Restaging CTs on 07/14/2014 with no evidence of metastatic colon cancer  Colonoscopy 10/02/2014 with diverticulosis in the sigmoid colon and in the descending colon. One 4 mm polyp in the sigmoid colon with complete resection. Polyp tissue not retrieved. Internal hemorrhoids. Next colonoscopy in 3  years for surveillance.  Restaging CTs 07/16/2015 with 2 new hypodense liver lesions concerning for metastases, no other evidence of disease progression  PET scan 07/28/2015 with a single hypermetabolic right liver lesion, no other evidence of metastatic disease  Ultrasound-guided biopsy of the right liver lesion 07/30/2015 confirmed metastatic colon cancer  MRI abdomen 08/20/2015 with similar to slight decrease in size of 2 hepatic metastasis. No new liver lesions or extrahepatic metastatic disease identified.  Radiofrequency ablation of 2 liver lesions 09/25/2015  MRI abdomen 02/02/2016-coagulative necrosis at the 2 ablated liver tumor sites,with no enhancement to suggest residual or recurrent liver tumor. No new liver masses or other findings of metastatic disease in the abdomen.  MRI abdomen 08/10/2016 revealed stable ablation sites, no evidence of progressive metastatic disease  Restaging CT 02/08/2017-stable treated liver lesions, no evidence of progressive metastatic disease, stable borderline enlarged right inguinal node  CEA with stable mild elevation 02/08/2017, 03/01/2017, 03/29/2017,04/19/2017  CEA further elevated 08/21/2017  CTs 09/05/2017-possible local recurrence of tumor along the ablation site right hepatic lobe lesion. Cephalad to the ablation site approximately 1.9 x 2.1cmsoft tissue mass progressive compared to the prior exam.  Liver SBRT 10/17/2017, 10/19/2017, 10/23/2017, 10/25/2017, 10/27/2017  CT 02/19/2018- progressive ill-defined areas of low attenuation with enhancement surrounding the segment 6 ablation site, stable segment 8 ablation site enlargement of extracapsular lesion posteriorly, no new hepatic lesions, wall thickening/edema at the distal ileum  CT 05/22/2018-2 new lung nodules suspicious for metastases, stable segment 8 ablation zone, slight decrease in the size of delayed perfusion at the segment 6  ablation zone, posterior extracapsular lesion is slightly  smaller  CT chest 07/13/2018-multiple lung nodules consistent with metastatic disease  Cycle 1 Xeloda 07/23/2018  Cycle 2 Xeloda 08/13/2018  Cycle 3 Xeloda 09/03/2018  Cycle 4 Xeloda 09/24/2018  Restaging chest CT 10/12/2018- scattered pulmonary nodules/metastases mildly improved.  Cycle 5 Xeloda 10/15/2018  Cycle 6 Xeloda 11/05/2018  Cycle 7 Xeloda 11/26/2018 2. Right ovary cystadenofibroma, status post a bilateral oophorectomy 10/22/2013 3. Remote history of cervical cancer. 4. Iron deficiency anemia-resolved. 5. Right calf deep vein thrombosis 10/24/2013-she completed 3 months of xarelto. 6. Indeterminate 12 mm hypermetabolic right pelvic density on the staging PET scan 10/04/2013-CT followup recommended per the GI tumor conference 11/13/2013. No longer visualized on a CT 07/14/2014. 7. Soft tissue implant overlying the posterior right liver on the CT 83/25/4982-MEB hypermetabolic on the PET scan 58/30/9407. 8. C. difficile colitis 03/05/2014. She completed a course of Flagyl, recurrent diarrhea beginning 03/21/2014, positive C. difficile toxin 03/26/2014-resumed Flagyl for planned 10 day course.  Vancomycin beginning 04/08/2014.  Taper complete 05/21/2014.  Disposition: She appears unchanged.  She describes paronychia from the Xeloda.  This is mild.  She will contact us for worsening hand/foot symptoms.  She will begin another cycle of Xeloda on 11/26/2018.  Cynthia [name] will be scheduled for an office and lab visit on 12/13/2018.  The plan is to complete a few more months of Xeloda prior to another restaging CT.  The CEA remains in the normal range.  .  I provided 20 minutes of telephone time during this encounter, and > 50% was spent counseling as documented under my assessment & plan.  [name] ANP/GNP-BC   11/21/2018 12:47 PM

## 2018-11-21 NOTE — Telephone Encounter (Signed)
Called and scheduled appt per 4/8 los.  Patient aware of appt date and time.

## 2018-11-22 ENCOUNTER — Other Ambulatory Visit: Payer: Self-pay | Admitting: Nurse Practitioner

## 2018-11-22 MED FILL — CAPECITABINE 500 MG TABS: 500 | 21 days supply | Qty: 56 | Fill #0

## 2018-12-10 ENCOUNTER — Other Ambulatory Visit: Payer: Self-pay | Admitting: Oncology

## 2018-12-13 ENCOUNTER — Inpatient Hospital Stay (HOSPITAL_BASED_OUTPATIENT_CLINIC_OR_DEPARTMENT_OTHER): Payer: Medicare Other | Admitting: Nurse Practitioner

## 2018-12-13 ENCOUNTER — Encounter: Payer: Self-pay | Admitting: Nurse Practitioner

## 2018-12-13 ENCOUNTER — Other Ambulatory Visit: Payer: Self-pay

## 2018-12-13 ENCOUNTER — Inpatient Hospital Stay: Payer: Medicare Other

## 2018-12-13 ENCOUNTER — Telehealth: Payer: Self-pay | Admitting: Nurse Practitioner

## 2018-12-13 VITALS — BP 150/75 | HR 77 | Temp 98.1°F | Resp 17 | Ht 61.0 in | Wt 141.6 lb

## 2018-12-13 DIAGNOSIS — L271 Localized skin eruption due to drugs and medicaments taken internally: Secondary | ICD-10-CM

## 2018-12-13 DIAGNOSIS — C787 Secondary malignant neoplasm of liver and intrahepatic bile duct: Principal | ICD-10-CM

## 2018-12-13 DIAGNOSIS — L03032 Cellulitis of left toe: Secondary | ICD-10-CM

## 2018-12-13 DIAGNOSIS — C7800 Secondary malignant neoplasm of unspecified lung: Secondary | ICD-10-CM

## 2018-12-13 DIAGNOSIS — C180 Malignant neoplasm of cecum: Secondary | ICD-10-CM | POA: Diagnosis not present

## 2018-12-13 DIAGNOSIS — C189 Malignant neoplasm of colon, unspecified: Secondary | ICD-10-CM

## 2018-12-13 DIAGNOSIS — Z8541 Personal history of malignant neoplasm of cervix uteri: Secondary | ICD-10-CM

## 2018-12-13 LAB — CBC WITH DIFFERENTIAL (CANCER CENTER ONLY)
Abs Immature Granulocytes: 0.02 10*3/uL (ref 0.00–0.07)
Basophils Absolute: 0 10*3/uL (ref 0.0–0.1)
Basophils Relative: 1 %
Eosinophils Absolute: 0.2 10*3/uL (ref 0.0–0.5)
Eosinophils Relative: 3 %
HCT: 33.5 % — ABNORMAL LOW (ref 36.0–46.0)
Hemoglobin: 10.9 g/dL — ABNORMAL LOW (ref 12.0–15.0)
Immature Granulocytes: 0 %
Lymphocytes Relative: 20 %
Lymphs Abs: 1.1 10*3/uL (ref 0.7–4.0)
MCH: 36.2 pg — ABNORMAL HIGH (ref 26.0–34.0)
MCHC: 32.5 g/dL (ref 30.0–36.0)
MCV: 111.3 fL — ABNORMAL HIGH (ref 80.0–100.0)
Monocytes Absolute: 0.6 10*3/uL (ref 0.1–1.0)
Monocytes Relative: 11 %
Neutro Abs: 3.6 10*3/uL (ref 1.7–7.7)
Neutrophils Relative %: 65 %
Platelet Count: 188 10*3/uL (ref 150–400)
RBC: 3.01 MIL/uL — ABNORMAL LOW (ref 3.87–5.11)
RDW: 19.1 % — ABNORMAL HIGH (ref 11.5–15.5)
WBC Count: 5.6 10*3/uL (ref 4.0–10.5)
nRBC: 0 % (ref 0.0–0.2)

## 2018-12-13 LAB — CMP (CANCER CENTER ONLY)
ALT: 9 U/L (ref 0–44)
AST: 20 U/L (ref 15–41)
Albumin: 3.5 g/dL (ref 3.5–5.0)
Alkaline Phosphatase: 54 U/L (ref 38–126)
Anion gap: 8 (ref 5–15)
BUN: 14 mg/dL (ref 8–23)
CO2: 27 mmol/L (ref 22–32)
Calcium: 9 mg/dL (ref 8.9–10.3)
Chloride: 108 mmol/L (ref 98–111)
Creatinine: 1.01 mg/dL — ABNORMAL HIGH (ref 0.44–1.00)
GFR, Est AFR Am: >60 mL/min (ref >60)
GFR, Estimated: 53 mL/min — ABNORMAL LOW (ref >60)
Glucose, Bld: 84 mg/dL (ref 70–99)
Potassium: 4.4 mmol/L (ref 3.5–5.1)
Sodium: 143 mmol/L (ref 135–145)
Total Bilirubin: 0.5 mg/dL (ref 0.3–1.2)
Total Protein: 6.5 g/dL (ref 6.5–8.1)

## 2018-12-13 LAB — CEA (IN HOUSE-CHCC): CEA (CHCC-In House): 2.9 ng/mL (ref 0.00–5.00)

## 2018-12-13 MED FILL — CAPECITABINE 500 MG TABS: 500 | 21 days supply | Qty: 56 | Fill #0

## 2018-12-13 NOTE — Telephone Encounter (Signed)
Scheduled appt per 4/30 los. ° ° °

## 2018-12-13 NOTE — Progress Notes (Signed)
[name] OFFICE PROGRESS NOTE   Diagnosis: Colon cancer  INTERVAL HISTORY:   Cynthia [name] returns as scheduled.  She completed cycle 7 Xeloda beginning 11/26/2018.  She denies nausea/vomiting.  No mouth sores.  No diarrhea.  She has noted some changes involving the nails on the hands and feet.  The left great toe recently became red and painful.  This has improved.  Objective:  Vital signs in last 24 hours:  Blood pressure (!) 150/75, pulse 77, temperature 98.1 F (36.7 C), temperature source Oral, resp. rate 17, height 5' 1"  (1.549 m), weight 141 lb 9.6 oz (64.2 kg), SpO2 97 %.    HEENT: No thrush or ulcers. Vascular: No leg edema. Skin: Mild erythema and skin thickening at the right chest wall.  Palms and soles with erythema, skin thickening.  Left great toenail base with drainage.   Lab Results:  Lab Results  Component Value Date   WBC 5.6 12/13/2018   HGB 10.9 (L) 12/13/2018   HCT 33.5 (L) 12/13/2018   MCV 111.3 (H) 12/13/2018   PLT 188 12/13/2018   NEUTROABS 3.6 12/13/2018    Imaging:  No results found.  Medications: I have reviewed the patient's current medications.  Assessment/Plan: 1. Moderately differentiated adenocarcinoma of the cecum, stage III (T4a, N1), status post a laparoscopic assisted right colectomy 10/22/2013  The tumor returned microsatellite stable with normal mismatch repair protein expression   Cycle 1 adjuvant Xeloda 12/25/2013.   Cycle 2 adjuvant Xeloda 01/15/2014.   Cycle 3 adjuvant Xeloda 02/05/2014.   Cycle 4 adjuvant Xeloda 03/18/2014, discontinued after 4 days secondary to diarrhea   Cycle 5 adjuvant Xeloda 04/16/2014, Xeloda dose reduced to 1000 mg in the a.m. and 500 mg in the p.m.   Cycle 6 adjuvant Xeloda 05/07/2014.  Cycle 7 adjuvant Xeloda 05/28/2014.  Cycle 8 adjuvant Xeloda 06/18/2014.  Restaging CTs on 07/14/2014 with no evidence of metastatic colon cancer  Colonoscopy 10/02/2014 with  diverticulosis in the sigmoid colon and in the descending colon. One 4 mm polyp in the sigmoid colon with complete resection. Polyp tissue not retrieved. Internal hemorrhoids. Next colonoscopy in 3 years for surveillance.  Restaging CTs 07/16/2015 with 2 new hypodense liver lesions concerning for metastases, no other evidence of disease progression  PET scan 07/28/2015 with a single hypermetabolic right liver lesion, no other evidence of metastatic disease  Ultrasound-guided biopsy of the right liver lesion 07/30/2015 confirmed metastatic colon cancer  MRI abdomen 08/20/2015 with similar to slight decrease in size of 2 hepatic metastasis. No new liver lesions or extrahepatic metastatic disease identified.  Radiofrequency ablation of 2 liver lesions 09/25/2015  MRI abdomen 02/02/2016-coagulative necrosis at the 2 ablated liver tumor sites,with no enhancement to suggest residual or recurrent liver tumor. No new liver masses or other findings of metastatic disease in the abdomen.  MRI abdomen 08/10/2016 revealed stable ablation sites, no evidence of progressive metastatic disease  Restaging CT 02/08/2017-stable treated liver lesions, no evidence of progressive metastatic disease, stable borderline enlarged right inguinal node  CEA with stable mild elevation 02/08/2017, 03/01/2017, 03/29/2017,04/19/2017  CEA further elevated 08/21/2017  CTs 09/05/2017-possible local recurrence of tumor along the ablation site right hepatic lobe lesion. Cephalad to the ablation site approximately 1.9 x 2.1cmsoft tissue mass progressive compared to the prior exam.  Liver SBRT 10/17/2017, 10/19/2017, 10/23/2017, 10/25/2017, 10/27/2017  CT 02/19/2018- progressive ill-defined areas of low attenuation with enhancement surrounding the segment 6 ablation site, stable segment 8 ablation site enlargement of extracapsular lesion posteriorly, no  new hepatic lesions, wall thickening/edema at the distal ileum  CT 05/22/2018-2 new  lung nodules suspicious for metastases, stable segment 8 ablation zone, slight decrease in the size of delayed perfusion at the segment 6 ablation zone, posterior extracapsular lesion is slightly smaller  CT chest 07/13/2018-multiple lung nodules consistent with metastatic disease  Cycle 1 Xeloda 07/23/2018  Cycle 2 Xeloda 08/13/2018  Cycle 3 Xeloda 09/03/2018  Cycle 4 Xeloda 09/24/2018  Restaging chest CT 10/12/2018- scattered pulmonary nodules/metastases mildly improved.  Cycle 5 Xeloda 10/15/2018  Cycle 6 Xeloda 11/05/2018  Cycle 7 Xeloda 11/26/2018  Cycle 8 Xeloda 12/17/2018 2. Right ovary cystadenofibroma, status post a bilateral oophorectomy 10/22/2013 3. Remote history of cervical cancer. 4. Iron deficiency anemia-resolved. 5. Right calf deep vein thrombosis 10/24/2013-she completed 3 months of xarelto. 6. Indeterminate 12 mm hypermetabolic right pelvic density on the staging PET scan 10/04/2013-CT followup recommended per the GI tumor conference 11/13/2013. No longer visualized on a CT 07/14/2014. 7. Soft tissue implant overlying the posterior right liver on the CT 97/28/2060-RVI hypermetabolic on the PET scan 15/37/9432. 8. C. difficile colitis 03/05/2014. She completed a course of Flagyl, recurrent diarrhea beginning 03/21/2014, positive C. difficile toxin 03/26/2014-resumed Flagyl for planned 10 day course.  Vancomycin beginning 04/08/2014.  Taper complete 05/21/2014.  Disposition: Cynthia [name] appears stable.  She has completed 7 cycles of Xeloda.  Plan to proceed with cycle 8 as scheduled beginning 12/17/2018.  She has hand-foot syndrome/paronychia related to Xeloda.  She will continue to apply lotion liberally.  She will do warm water foot soaks twice a day and apply Neosporin with a Band-Aid to the left great toe.  She understands to contact the office if any of the hand-foot changes worsen.  We reviewed the CBC from today.  Counts adequate to proceed as above.  She will  return for lab and follow-up in 3 weeks.  She will contact the office in the interim as outlined above or with any other problems.    Ned Card ANP/GNP-BC   12/13/2018  9:32 AM

## 2019-01-02 ENCOUNTER — Telehealth: Payer: Self-pay | Admitting: Oncology

## 2019-01-02 ENCOUNTER — Other Ambulatory Visit: Payer: Self-pay

## 2019-01-02 ENCOUNTER — Other Ambulatory Visit: Payer: Self-pay | Admitting: *Deleted

## 2019-01-02 ENCOUNTER — Inpatient Hospital Stay: Payer: Medicare Other | Attending: Oncology

## 2019-01-02 ENCOUNTER — Inpatient Hospital Stay (HOSPITAL_BASED_OUTPATIENT_CLINIC_OR_DEPARTMENT_OTHER): Payer: Medicare Other | Admitting: Oncology

## 2019-01-02 VITALS — BP 166/84 | HR 72 | Temp 98.2°F | Resp 17 | Ht 61.0 in | Wt 141.8 lb

## 2019-01-02 DIAGNOSIS — C180 Malignant neoplasm of cecum: Secondary | ICD-10-CM

## 2019-01-02 DIAGNOSIS — L539 Erythematous condition, unspecified: Secondary | ICD-10-CM | POA: Diagnosis not present

## 2019-01-02 DIAGNOSIS — Z8541 Personal history of malignant neoplasm of cervix uteri: Secondary | ICD-10-CM

## 2019-01-02 DIAGNOSIS — C787 Secondary malignant neoplasm of liver and intrahepatic bile duct: Secondary | ICD-10-CM | POA: Insufficient documentation

## 2019-01-02 DIAGNOSIS — C7800 Secondary malignant neoplasm of unspecified lung: Secondary | ICD-10-CM | POA: Insufficient documentation

## 2019-01-02 DIAGNOSIS — C189 Malignant neoplasm of colon, unspecified: Secondary | ICD-10-CM

## 2019-01-02 LAB — CBC WITH DIFFERENTIAL (CANCER CENTER ONLY)
Abs Immature Granulocytes: 0.02 10*3/uL (ref 0.00–0.07)
Basophils Absolute: 0 10*3/uL (ref 0.0–0.1)
Basophils Relative: 0 %
Eosinophils Absolute: 0.1 10*3/uL (ref 0.0–0.5)
Eosinophils Relative: 2 %
HCT: 34.7 % — ABNORMAL LOW (ref 36.0–46.0)
Hemoglobin: 11.7 g/dL — ABNORMAL LOW (ref 12.0–15.0)
Immature Granulocytes: 0 %
Lymphocytes Relative: 19 %
Lymphs Abs: 1 10*3/uL (ref 0.7–4.0)
MCH: 36.9 pg — ABNORMAL HIGH (ref 26.0–34.0)
MCHC: 33.7 g/dL (ref 30.0–36.0)
MCV: 109.5 fL — ABNORMAL HIGH (ref 80.0–100.0)
Monocytes Absolute: 0.5 10*3/uL (ref 0.1–1.0)
Monocytes Relative: 9 %
Neutro Abs: 3.7 10*3/uL (ref 1.7–7.7)
Neutrophils Relative %: 70 %
Platelet Count: 188 10*3/uL (ref 150–400)
RBC: 3.17 MIL/uL — ABNORMAL LOW (ref 3.87–5.11)
RDW: 18.3 % — ABNORMAL HIGH (ref 11.5–15.5)
WBC Count: 5.3 10*3/uL (ref 4.0–10.5)
nRBC: 0 % (ref 0.0–0.2)

## 2019-01-02 LAB — CMP (CANCER CENTER ONLY)
ALT: 11 U/L (ref 0–44)
AST: 21 U/L (ref 15–41)
Albumin: 3.8 g/dL (ref 3.5–5.0)
Alkaline Phosphatase: 59 U/L (ref 38–126)
Anion gap: 9 (ref 5–15)
BUN: 12 mg/dL (ref 8–23)
CO2: 28 mmol/L (ref 22–32)
Calcium: 9.4 mg/dL (ref 8.9–10.3)
Chloride: 106 mmol/L (ref 98–111)
Creatinine: 0.94 mg/dL (ref 0.44–1.00)
GFR, Est AFR Am: >60 mL/min
GFR, Estimated: 57 mL/min — ABNORMAL LOW
Glucose, Bld: 86 mg/dL (ref 70–99)
Potassium: 4.2 mmol/L (ref 3.5–5.1)
Sodium: 143 mmol/L (ref 135–145)
Total Bilirubin: 0.7 mg/dL (ref 0.3–1.2)
Total Protein: 6.9 g/dL (ref 6.5–8.1)

## 2019-01-02 LAB — CEA (IN HOUSE-CHCC): CEA (CHCC-In House): 3.64 ng/mL (ref 0.00–5.00)

## 2019-01-02 MED ORDER — CAPECITABINE 500 MG PO TABS: 1000.0000 mg | ORAL_TABLET | Freq: Two times a day (BID) | ORAL | 0 refills | Status: DC

## 2019-01-02 NOTE — Progress Notes (Signed)
[place] OFFICE PROGRESS NOTE   Diagnosis: Colon cancer  INTERVAL HISTORY:   Cynthia [name] begin another cycle of Xeloda on 12/17/2018.  No mouth sores or diarrhea.  She has mild erythema at the hands and feet.  The induration of the right chest wall has improved.  Objective:  Vital signs in last 24 hours:  Blood pressure (!) 166/84, pulse 72, temperature 98.2 F (36.8 C), resp. rate 17, height _0  (1.549 m), weight 141 lb 12.8 oz (64.3 kg), SpO2 97 %.    HEENT: No thrush or ulcers GI: No hepatomegaly, nontender Vascular: No leg edema Skin: Mild erythema at the palms and soles with dryness and few areas of cracking at the distal digits, area of induration at the right chest wall without a discrete mass or erythema    Lab Results:  Lab Results  Component Value Date   WBC 5.3 01/02/2019   HGB 11.7 (L) 01/02/2019   HCT 34.7 (L) 01/02/2019   MCV 109.5 (H) 01/02/2019   PLT 188 01/02/2019   NEUTROABS 3.7 01/02/2019    CMP  Lab Results  Component Value Date   NA 143 12/13/2018   K 4.4 12/13/2018   CL 108 12/13/2018   CO2 27 12/13/2018   GLUCOSE 84 12/13/2018   BUN 14 12/13/2018   CREATININE 1.01 (H) 12/13/2018   CALCIUM 9.0 12/13/2018   PROT 6.5 12/13/2018   ALBUMIN 3.5 12/13/2018   AST 20 12/13/2018   ALT 9 12/13/2018   ALKPHOS 54 12/13/2018   BILITOT 0.5 12/13/2018   GFRNONAA 53 (L) 12/13/2018   GFRAA >60 12/13/2018    Lab Results  Component Value Date   CEA1 2.90 12/13/2018     Medications: I have reviewed the patient's current medications.   Assessment/Plan: 1. Moderately differentiated adenocarcinoma of the cecum, stage III (T4a, N1), status post a laparoscopic assisted right colectomy 10/22/2013  The tumor returned microsatellite stable with normal mismatch repair protein expression   Cycle 1 adjuvant Xeloda 12/25/2013.   Cycle 2 adjuvant Xeloda 01/15/2014.   Cycle 3 adjuvant Xeloda 02/05/2014.   Cycle 4 adjuvant  Xeloda 03/18/2014, discontinued after 4 days secondary to diarrhea   Cycle 5 adjuvant Xeloda 04/16/2014, Xeloda dose reduced to 1000 mg in the a.m. and 500 mg in the p.m.   Cycle 6 adjuvant Xeloda 05/07/2014.  Cycle 7 adjuvant Xeloda 05/28/2014.  Cycle 8 adjuvant Xeloda 06/18/2014.  Restaging CTs on 07/14/2014 with no evidence of metastatic colon cancer  Colonoscopy 10/02/2014 with diverticulosis in the sigmoid colon and in the descending colon. One 4 mm polyp in the sigmoid colon with complete resection. Polyp tissue not retrieved. Internal hemorrhoids. Next colonoscopy in 3 years for surveillance.  Restaging CTs 07/16/2015 with 2 new hypodense liver lesions concerning for metastases, no other evidence of disease progression  PET scan 07/28/2015 with a single hypermetabolic right liver lesion, no other evidence of metastatic disease  Ultrasound-guided biopsy of the right liver lesion 07/30/2015 confirmed metastatic colon cancer  MRI abdomen 08/20/2015 with similar to slight decrease in size of 2 hepatic metastasis. No new liver lesions or extrahepatic metastatic disease identified.  Radiofrequency ablation of 2 liver lesions 09/25/2015  MRI abdomen 02/02/2016-coagulative necrosis at the 2 ablated liver tumor sites,with no enhancement to suggest residual or recurrent liver tumor. No new liver masses or other findings of metastatic disease in the abdomen.  MRI abdomen 08/10/2016 revealed stable ablation sites, no evidence of progressive metastatic disease  Restaging CT 02/08/2017-stable treated liver  lesions, no evidence of progressive metastatic disease, stable borderline enlarged right inguinal node  CEA with stable mild elevation 02/08/2017, 03/01/2017, 03/29/2017,04/19/2017  CEA further elevated 08/21/2017  CTs 09/05/2017-possible local recurrence of tumor along the ablation site right hepatic lobe lesion. Cephalad to the ablation site approximately 1.9 x 2.1cmsoft tissue  mass progressive compared to the prior exam.  Liver SBRT 10/17/2017, 10/19/2017, 10/23/2017, 10/25/2017, 10/27/2017  CT 02/19/2018- progressive ill-defined areas of low attenuation with enhancement surrounding the segment 6 ablation site, stable segment 8 ablation site enlargement of extracapsular lesion posteriorly, no new hepatic lesions, wall thickening/edema at the distal ileum  CT 05/22/2018-2 new lung nodules suspicious for metastases, stable segment 8 ablation zone, slight decrease in the size of delayed perfusion at the segment 6 ablation zone, posterior extracapsular lesion is slightly smaller  CT chest 07/13/2018-multiple lung nodules consistent with metastatic disease  Cycle 1 Xeloda 07/23/2018  Cycle 2 Xeloda 08/13/2018  Cycle 3 Xeloda 09/03/2018  Cycle 4 Xeloda 09/24/2018  Restaging chest CT 10/12/2018- scattered pulmonary nodules/metastases mildly improved.  Cycle 5 Xeloda 10/15/2018  Cycle 6 Xeloda 11/05/2018  Cycle 7 Xeloda 11/26/2018  Cycle 8 Xeloda 12/17/2018  Cycle 9 Xeloda 01/07/2019 2. Right ovary cystadenofibroma, status post a bilateral oophorectomy 10/22/2013 3. Remote history of cervical cancer. 4. Iron deficiency anemia-resolved. 5. Right calf deep vein thrombosis 10/24/2013-she completed 3 months of xarelto. 6. Indeterminate 12 mm hypermetabolic right pelvic density on the staging PET scan 10/04/2013-CT followup recommended per the GI tumor conference 11/13/2013. No longer visualized on a CT 07/14/2014. 7. Soft tissue implant overlying the posterior right liver on the CT 14/70/9295-FMB hypermetabolic on the PET scan 34/10/7094. 8. C. difficile colitis 03/05/2014. She completed a course of Flagyl, recurrent diarrhea beginning 03/21/2014, positive C. difficile toxin 03/26/2014-resumed Flagyl for planned 10 day course.  Vancomycin beginning 04/08/2014.  Taper complete 05/21/2014.    Disposition: Cynthia [name] appears unchanged.  She is tolerating Xeloda well.  She  will complete another cycle beginning 01/07/2019.  She will undergo a restaging CT prior to a return visit on 01/25/2019.  [name], MD  01/02/2019  10:22 AM

## 2019-01-02 NOTE — Telephone Encounter (Signed)
Scheduled appt per 5/20 los. A calendar will be mailed out. °

## 2019-01-08 ENCOUNTER — Encounter: Payer: Self-pay | Admitting: *Deleted

## 2019-01-08 MED FILL — CAPECITABINE 500 MG TABS: 500 | 21 days supply | Qty: 56 | Fill #0

## 2019-01-08 NOTE — Progress Notes (Unsigned)
Scheduling message sent to move 6/12 lab to 6/11 prior to CT scan.

## 2019-01-10 ENCOUNTER — Telehealth: Payer: Self-pay | Admitting: Oncology

## 2019-01-10 NOTE — Telephone Encounter (Signed)
Per 5/26 schedule message moved 6/12 lab to 6/11 prior to ct. Left message change and 6/11 and 6/12 appointments. Schedule mailed.

## 2019-01-24 ENCOUNTER — Ambulatory Visit (HOSPITAL_COMMUNITY)
Admission: RE | Admit: 2019-01-24 | Discharge: 2019-01-24 | Disposition: A | Discharge status: Home / Self Care | Payer: Medicare Other | Source: Ambulatory Visit | Attending: Oncology | Admitting: Oncology

## 2019-01-24 ENCOUNTER — Inpatient Hospital Stay: Payer: Medicare Other | Attending: Oncology

## 2019-01-24 ENCOUNTER — Other Ambulatory Visit: Payer: Self-pay

## 2019-01-24 DIAGNOSIS — Z8541 Personal history of malignant neoplasm of cervix uteri: Secondary | ICD-10-CM | POA: Diagnosis not present

## 2019-01-24 DIAGNOSIS — C189 Malignant neoplasm of colon, unspecified: Secondary | ICD-10-CM

## 2019-01-24 DIAGNOSIS — C7800 Secondary malignant neoplasm of unspecified lung: Secondary | ICD-10-CM | POA: Diagnosis not present

## 2019-01-24 DIAGNOSIS — L271 Localized skin eruption due to drugs and medicaments taken internally: Secondary | ICD-10-CM | POA: Diagnosis not present

## 2019-01-24 DIAGNOSIS — C787 Secondary malignant neoplasm of liver and intrahepatic bile duct: Secondary | ICD-10-CM | POA: Insufficient documentation

## 2019-01-24 DIAGNOSIS — C180 Malignant neoplasm of cecum: Secondary | ICD-10-CM | POA: Insufficient documentation

## 2019-01-24 LAB — CBC WITH DIFFERENTIAL (CANCER CENTER ONLY)
Abs Immature Granulocytes: 0.01 10*3/uL (ref 0.00–0.07)
Basophils Absolute: 0 10*3/uL (ref 0.0–0.1)
Basophils Relative: 0 %
Eosinophils Absolute: 0.1 10*3/uL (ref 0.0–0.5)
Eosinophils Relative: 2 %
HCT: 33.2 % — ABNORMAL LOW (ref 36.0–46.0)
Hemoglobin: 11 g/dL — ABNORMAL LOW (ref 12.0–15.0)
Immature Granulocytes: 0 %
Lymphocytes Relative: 20 %
Lymphs Abs: 1.2 10*3/uL (ref 0.7–4.0)
MCH: 36.2 pg — ABNORMAL HIGH (ref 26.0–34.0)
MCHC: 33.1 g/dL (ref 30.0–36.0)
MCV: 109.2 fL — ABNORMAL HIGH (ref 80.0–100.0)
Monocytes Absolute: 0.5 10*3/uL (ref 0.1–1.0)
Monocytes Relative: 9 %
Neutro Abs: 4.1 10*3/uL (ref 1.7–7.7)
Neutrophils Relative %: 69 %
Platelet Count: 191 10*3/uL (ref 150–400)
RBC: 3.04 MIL/uL — ABNORMAL LOW (ref 3.87–5.11)
RDW: 18 % — ABNORMAL HIGH (ref 11.5–15.5)
WBC Count: 5.9 10*3/uL (ref 4.0–10.5)
nRBC: 0 % (ref 0.0–0.2)

## 2019-01-24 LAB — CMP (CANCER CENTER ONLY)
ALT: 9 U/L (ref 0–44)
AST: 18 U/L (ref 15–41)
Albumin: 3.6 g/dL (ref 3.5–5.0)
Alkaline Phosphatase: 59 U/L (ref 38–126)
Anion gap: 8 (ref 5–15)
BUN: 12 mg/dL (ref 8–23)
CO2: 24 mmol/L (ref 22–32)
Calcium: 8.7 mg/dL — ABNORMAL LOW (ref 8.9–10.3)
Chloride: 110 mmol/L (ref 98–111)
Creatinine: 1.02 mg/dL — ABNORMAL HIGH (ref 0.44–1.00)
GFR, Est AFR Am: >60 mL/min (ref >60)
GFR, Estimated: 52 mL/min — ABNORMAL LOW (ref >60)
Glucose, Bld: 99 mg/dL (ref 70–99)
Potassium: 4 mmol/L (ref 3.5–5.1)
Sodium: 142 mmol/L (ref 135–145)
Total Bilirubin: 0.5 mg/dL (ref 0.3–1.2)
Total Protein: 6.6 g/dL (ref 6.5–8.1)

## 2019-01-24 LAB — CEA (IN HOUSE-CHCC): CEA (CHCC-In House): 3.15 ng/mL (ref 0.00–5.00)

## 2019-01-25 ENCOUNTER — Encounter: Payer: Self-pay | Admitting: Nurse Practitioner

## 2019-01-25 ENCOUNTER — Other Ambulatory Visit: Payer: Self-pay

## 2019-01-25 ENCOUNTER — Inpatient Hospital Stay (HOSPITAL_BASED_OUTPATIENT_CLINIC_OR_DEPARTMENT_OTHER): Payer: Medicare Other | Admitting: Nurse Practitioner

## 2019-01-25 ENCOUNTER — Other Ambulatory Visit: Payer: Medicare Other

## 2019-01-25 VITALS — BP 141/87 | HR 69 | Temp 98.9°F | Resp 17 | Ht 61.0 in | Wt 141.7 lb

## 2019-01-25 DIAGNOSIS — C7800 Secondary malignant neoplasm of unspecified lung: Secondary | ICD-10-CM

## 2019-01-25 DIAGNOSIS — L271 Localized skin eruption due to drugs and medicaments taken internally: Secondary | ICD-10-CM

## 2019-01-25 DIAGNOSIS — C189 Malignant neoplasm of colon, unspecified: Secondary | ICD-10-CM

## 2019-01-25 DIAGNOSIS — C787 Secondary malignant neoplasm of liver and intrahepatic bile duct: Secondary | ICD-10-CM | POA: Diagnosis not present

## 2019-01-25 DIAGNOSIS — C180 Malignant neoplasm of cecum: Secondary | ICD-10-CM

## 2019-01-25 DIAGNOSIS — Z8541 Personal history of malignant neoplasm of cervix uteri: Secondary | ICD-10-CM

## 2019-01-25 MED ORDER — CAPECITABINE 500 MG PO TABS: 500.0000 mg | ORAL_TABLET | Freq: Two times a day (BID) | ORAL | 0 refills | Status: DC

## 2019-01-25 NOTE — Progress Notes (Addendum)
[place] OFFICE PROGRESS NOTE   Diagnosis: Colon cancer  INTERVAL HISTORY:   Cynthia [name] returns as scheduled.  She completed cycle 9 Xeloda beginning 01/07/2019.  She denies nausea/vomiting.  No mouth sores.  No diarrhea.  She notes excessive dryness of the hands and feet, especially at the cuticles with associated discomfort.  Objective:  Vital signs in last 24 hours:  Blood pressure (!) 141/87, pulse 69, temperature 98.9 F (37.2 C), temperature source Oral, resp. rate 17, height _0  (1.549 m), weight 141 lb 11.2 oz (64.3 kg), SpO2 98 %.    HEENT: No thrush or ulcers. GI: Abdomen soft and nontender.  No hepatomegaly. Vascular: No leg edema. Skin: Palms and soles with mild erythema, skin thickening.  No skin breakdown.  Erythema at multiple fingernail bases.  Mild induration/skin thickening at the right chest wall without a discrete mass, no erythema.   Lab Results:  Lab Results  Component Value Date   WBC 5.9 01/24/2019   HGB 11.0 (L) 01/24/2019   HCT 33.2 (L) 01/24/2019   MCV 109.2 (H) 01/24/2019   PLT 191 01/24/2019   NEUTROABS 4.1 01/24/2019    Imaging:  Ct Chest Wo Contrast  Result Date: 01/24/2019 CLINICAL DATA:  Follow-up metastatic pulmonary disease. History of colon cancer 2015. Patient undergoing chemotherapy. EXAM: CT CHEST WITHOUT CONTRAST TECHNIQUE: Multidetector CT imaging of the chest was performed following the standard protocol without IV contrast. COMPARISON:  10/12/2018 FINDINGS: Cardiovascular: The cardiac silhouette, mediastinal and hilar contours are within normal limits and stable. No pericardial effusion. Stable tortuosity, ectasia and calcification of the thoracic aorta and stable three-vessel coronary artery calcifications. Stable 2 cm pericardial cyst on the right side. Mediastinum/Nodes: No mediastinal or hilar mass Lungs/Pleura: Scattered pulmonary nodules are unchanged since the prior CT scan. 4 mm right upper lobe nodule  on image number 47 is stable. 5 mm lingular nodule on image number 89 is unchanged. Stable 12 mm right middle lobe nodule on image number 97. 3.5 mm perifissural nodule in the left lower lobe on image number 72 is unchanged. No new pulmonary lesions and no acute pulmonary findings. Stable right basilar scarring changes. No pleural effusion. Upper Abdomen: Stable left hepatic lobe cysts. Right hepatic lobe lesions appears stable. No obvious new lesions without contrast. Musculoskeletal: Stable right T9 and T10 rib lesions. IMPRESSION: 1. Stable pulmonary nodules.  No new pulmonary lesions. 2. No mediastinal or hilar mass or adenopathy. 3. Stable left hepatic lobe cysts and right hepatic lobe lesions. No new hepatic lesions. 4. Stable right ninth and tenth rib lesions. 5. Stable small right-sided pericardial cyst. Aortic Atherosclerosis (ICD10-I70.0) and Emphysema (ICD10-J43.9). Electronically Signed   By: Marijo Sanes M.D.   On: 01/24/2019 14:55    Medications: I have reviewed the patient's current medications.  Assessment/Plan: 1. Moderately differentiated adenocarcinoma of the cecum, stage III (T4a, N1), status post a laparoscopic assisted right colectomy 10/22/2013  The tumor returned microsatellite stable with normal mismatch repair protein expression   Cycle 1 adjuvant Xeloda 12/25/2013.   Cycle 2 adjuvant Xeloda 01/15/2014.   Cycle 3 adjuvant Xeloda 02/05/2014.   Cycle 4 adjuvant Xeloda 03/18/2014, discontinued after 4 days secondary to diarrhea   Cycle 5 adjuvant Xeloda 04/16/2014, Xeloda dose reduced to 1000 mg in the a.m. and 500 mg in the p.m.   Cycle 6 adjuvant Xeloda 05/07/2014.  Cycle 7 adjuvant Xeloda 05/28/2014.  Cycle 8 adjuvant Xeloda 06/18/2014.  Restaging CTs on 07/14/2014 with no evidence of metastatic colon  cancer  Colonoscopy 10/02/2014 with diverticulosis in the sigmoid colon and in the descending colon. One 4 mm polyp in the sigmoid colon with complete  resection. Polyp tissue not retrieved. Internal hemorrhoids. Next colonoscopy in 3 years for surveillance.  Restaging CTs 07/16/2015 with 2 new hypodense liver lesions concerning for metastases, no other evidence of disease progression  PET scan 07/28/2015 with a single hypermetabolic right liver lesion, no other evidence of metastatic disease  Ultrasound-guided biopsy of the right liver lesion 07/30/2015 confirmed metastatic colon cancer  MRI abdomen 08/20/2015 with similar to slight decrease in size of 2 hepatic metastasis. No new liver lesions or extrahepatic metastatic disease identified.  Radiofrequency ablation of 2 liver lesions 09/25/2015  MRI abdomen 02/02/2016-coagulative necrosis at the 2 ablated liver tumor sites,with no enhancement to suggest residual or recurrent liver tumor. No new liver masses or other findings of metastatic disease in the abdomen.  MRI abdomen 08/10/2016 revealed stable ablation sites, no evidence of progressive metastatic disease  Restaging CT 02/08/2017-stable treated liver lesions, no evidence of progressive metastatic disease, stable borderline enlarged right inguinal node  CEA with stable mild elevation 02/08/2017, 03/01/2017, 03/29/2017,04/19/2017  CEA further elevated 08/21/2017  CTs 09/05/2017-possible local recurrence of tumor along the ablation site right hepatic lobe lesion. Cephalad to the ablation site approximately 1.9 x 2.1cmsoft tissue mass progressive compared to the prior exam.  Liver SBRT 10/17/2017, 10/19/2017, 10/23/2017, 10/25/2017, 10/27/2017  CT 02/19/2018- progressive ill-defined areas of low attenuation with enhancement surrounding the segment 6 ablation site, stable segment 8 ablation site enlargement of extracapsular lesion posteriorly, no new hepatic lesions, wall thickening/edema at the distal ileum  CT 05/22/2018-2 new lung nodules suspicious for metastases, stable segment 8 ablation zone, slight decrease in the size of delayed  perfusion at the segment 6 ablation zone, posterior extracapsular lesion is slightly smaller  CT chest 07/13/2018-multiple lung nodules consistent with metastatic disease  Cycle 1 Xeloda 07/23/2018  Cycle 2 Xeloda 08/13/2018  Cycle 3 Xeloda 09/03/2018  Cycle 4 Xeloda 09/24/2018  Restaging chest CT 10/12/2018- scattered pulmonary nodules/metastases mildly improved.  Cycle 5 Xeloda 10/15/2018  Cycle 6 Xeloda 11/05/2018  Cycle 7 Xeloda 11/26/2018  Cycle 8 Xeloda 12/17/2018  Cycle 9 Xeloda 01/07/2019  CT chest 01/24/2019- stable pulmonary nodules.  No new pulmonary nodules.  Stable left hepatic lobe cysts and right hepatic lobe lesions.  No new hepatic lesions.  Stable right ninth and 10th rib lesions. 2. Right ovary cystadenofibroma, status post a bilateral oophorectomy 10/22/2013 3. Remote history of cervical cancer. 4. Iron deficiency anemia-resolved. 5. Right calf deep vein thrombosis 10/24/2013-she completed 3 months of xarelto. 6. Indeterminate 12 mm hypermetabolic right pelvic density on the staging PET scan 10/04/2013-CT followup recommended per the GI tumor conference 11/13/2013. No longer visualized on a CT 07/14/2014. 7. Soft tissue implant overlying the posterior right liver on the CT 20/25/4270-WCB hypermetabolic on the PET scan 76/28/3151. 8. C. difficile colitis 03/05/2014. She completed a course of Flagyl, recurrent diarrhea beginning 03/21/2014, positive C. difficile toxin 03/26/2014-resumed Flagyl for planned 10 day course.  Vancomycin beginning 04/08/2014.  Taper complete 05/21/2014.  9.  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  Xeloda dose/schedule adjusted beginning 01/28/2019.   Disposition: Cynthia [name] appears unchanged.  She has completed 9 cycles of Xeloda.  The recent restaging chest CT shows stable disease.  She has hand-foot syndrome with skin dryness and paronychia causing discomfort.  The Xeloda dose/schedule will be adjusted.   She will begin 500 mg twice daily continuously  beginning 01/28/2019.  She will return for lab and follow-up in 3 weeks.  She will contact the office in the interim with any problems.  Patient seen with Dr. Benay Spice.  CT images reviewed on the computer with Cynthia [name] at today's visit.    Ned Card ANP/GNP-BC   01/25/2019  12:39 PM  This was a shared visit with Ned Card.  Cynthia [name] was interviewed and examined.  We reviewed the restaging CT images with her.  There is no clinical or CT evidence of disease progression.  She has mild hand/foot syndrome.  We decided to reduce the Xeloda dose and change to a continuous schedule.  Julieanne Manson, MD

## 2019-01-28 ENCOUNTER — Telehealth: Payer: Self-pay | Admitting: Nurse Practitioner

## 2019-01-28 MED FILL — CAPECITABINE 500 MG TABS: 500 | 30 days supply | Qty: 60 | Fill #0

## 2019-01-28 NOTE — Telephone Encounter (Signed)
Scheduled appt per 6/12 los. Spoke with patient and patient aware of appt date and time. °

## 2019-02-14 ENCOUNTER — Inpatient Hospital Stay (HOSPITAL_BASED_OUTPATIENT_CLINIC_OR_DEPARTMENT_OTHER): Payer: Medicare Other | Admitting: Nurse Practitioner

## 2019-02-14 ENCOUNTER — Encounter: Payer: Self-pay | Admitting: Nurse Practitioner

## 2019-02-14 ENCOUNTER — Inpatient Hospital Stay: Payer: Medicare Other | Attending: Oncology

## 2019-02-14 ENCOUNTER — Other Ambulatory Visit: Payer: Self-pay

## 2019-02-14 ENCOUNTER — Telehealth: Payer: Self-pay

## 2019-02-14 VITALS — BP 142/83 | HR 68 | Temp 98.7°F | Resp 18 | Ht 61.0 in | Wt 141.5 lb

## 2019-02-14 DIAGNOSIS — C787 Secondary malignant neoplasm of liver and intrahepatic bile duct: Secondary | ICD-10-CM | POA: Diagnosis not present

## 2019-02-14 DIAGNOSIS — C180 Malignant neoplasm of cecum: Secondary | ICD-10-CM

## 2019-02-14 DIAGNOSIS — Z8541 Personal history of malignant neoplasm of cervix uteri: Secondary | ICD-10-CM | POA: Diagnosis not present

## 2019-02-14 DIAGNOSIS — C7800 Secondary malignant neoplasm of unspecified lung: Secondary | ICD-10-CM | POA: Insufficient documentation

## 2019-02-14 DIAGNOSIS — L271 Localized skin eruption due to drugs and medicaments taken internally: Secondary | ICD-10-CM | POA: Insufficient documentation

## 2019-02-14 DIAGNOSIS — C189 Malignant neoplasm of colon, unspecified: Secondary | ICD-10-CM

## 2019-02-14 LAB — CMP (CANCER CENTER ONLY)
ALT: 11 U/L (ref 0–44)
AST: 20 U/L (ref 15–41)
Albumin: 3.4 g/dL — ABNORMAL LOW (ref 3.5–5.0)
Alkaline Phosphatase: 66 U/L (ref 38–126)
Anion gap: 9 (ref 5–15)
BUN: 15 mg/dL (ref 8–23)
CO2: 27 mmol/L (ref 22–32)
Calcium: 9 mg/dL (ref 8.9–10.3)
Chloride: 107 mmol/L (ref 98–111)
Creatinine: 0.95 mg/dL (ref 0.44–1.00)
GFR, Est AFR Am: >60 mL/min (ref >60)
GFR, Estimated: 57 mL/min — ABNORMAL LOW (ref >60)
Glucose, Bld: 82 mg/dL (ref 70–99)
Potassium: 4.3 mmol/L (ref 3.5–5.1)
Sodium: 143 mmol/L (ref 135–145)
Total Bilirubin: 0.5 mg/dL (ref 0.3–1.2)
Total Protein: 6.6 g/dL (ref 6.5–8.1)

## 2019-02-14 LAB — CBC WITH DIFFERENTIAL (CANCER CENTER ONLY)
Abs Immature Granulocytes: 0.01 10*3/uL (ref 0.00–0.07)
Basophils Absolute: 0 10*3/uL (ref 0.0–0.1)
Basophils Relative: 1 %
Eosinophils Absolute: 0.2 10*3/uL (ref 0.0–0.5)
Eosinophils Relative: 4 %
HCT: 34.9 % — ABNORMAL LOW (ref 36.0–46.0)
Hemoglobin: 11.4 g/dL — ABNORMAL LOW (ref 12.0–15.0)
Immature Granulocytes: 0 %
Lymphocytes Relative: 22 %
Lymphs Abs: 1.4 10*3/uL (ref 0.7–4.0)
MCH: 35.7 pg — ABNORMAL HIGH (ref 26.0–34.0)
MCHC: 32.7 g/dL (ref 30.0–36.0)
MCV: 109.4 fL — ABNORMAL HIGH (ref 80.0–100.0)
Monocytes Absolute: 0.7 10*3/uL (ref 0.1–1.0)
Monocytes Relative: 11 %
Neutro Abs: 4 10*3/uL (ref 1.7–7.7)
Neutrophils Relative %: 62 %
Platelet Count: 224 10*3/uL (ref 150–400)
RBC: 3.19 MIL/uL — ABNORMAL LOW (ref 3.87–5.11)
RDW: 17.8 % — ABNORMAL HIGH (ref 11.5–15.5)
WBC Count: 6.3 10*3/uL (ref 4.0–10.5)
nRBC: 0 % (ref 0.0–0.2)

## 2019-02-14 LAB — CEA (IN HOUSE-CHCC): CEA (CHCC-In House): 13.26 ng/mL — ABNORMAL HIGH (ref 0.00–5.00)

## 2019-02-14 NOTE — Telephone Encounter (Signed)
Per Lattie Haw T/C to Pt. To inform Pt. About CEA results v/m message left for Pt. To return call.

## 2019-02-14 NOTE — Telephone Encounter (Signed)
-----   Message from Owens Shark, NP sent at 02/14/2019  4:11 PM EDT ----- Please let her know the CEA was higher.  We will repeat at the time of her next visit in 3 weeks.

## 2019-02-14 NOTE — Progress Notes (Signed)
[name] OFF[facility] PROGRESS NOTE   Diagnosis:  Colon cancer  INTERVAL HISTORY:   Cynthia [name] returns as scheduled.  She continues Xeloda on a daily dosing schedule.  She notes hands and feet are dry.  No associated pain.  The right great toenail is discolored.  No nausea or vomiting.  No mouth sores.  She estimates having diarrhea 1 time a month.  Objective:  Vital signs in last 24 hours:  Blood pressure (!) 142/83, pulse 68, temperature 98.7 F (37.1 C), temperature source Temporal, resp. rate 18, height _0  (1.549 m), weight 141 lb 8 oz (64.2 kg), SpO2 97 %.    HEENT: No thrush or ulcers. Vascular: No leg edema. Skin: Palms and soles with mild erythema, skin thickening.  Right great toenail is discolored.  Mild induration/skin thickening at the right chest wall without a discrete mass.   Lab Results:  Lab Results  Component Value Date   WBC 6.3 02/14/2019   HGB 11.4 (L) 02/14/2019   HCT 34.9 (L) 02/14/2019   MCV 109.4 (H) 02/14/2019   PLT 224 02/14/2019   NEUTROABS 4.0 02/14/2019    Imaging:  No results found.  Medications: I have reviewed the patient's current medications.  Assessment/Plan: 1. Moderately differentiated adenocarcinoma of the cecum, stage III (T4a, N1), status post a laparoscopic assisted right colectomy 10/22/2013  The tumor returned microsatellite stable with normal mismatch repair protein expression   Cycle 1 adjuvant Xeloda 12/25/2013.   Cycle 2 adjuvant Xeloda 01/15/2014.   Cycle 3 adjuvant Xeloda 02/05/2014.   Cycle 4 adjuvant Xeloda 03/18/2014, discontinued after 4 days secondary to diarrhea   Cycle 5 adjuvant Xeloda 04/16/2014, Xeloda dose reduced to 1000 mg in the a.m. and 500 mg in the p.m.   Cycle 6 adjuvant Xeloda 05/07/2014.  Cycle 7 adjuvant Xeloda 05/28/2014.  Cycle 8 adjuvant Xeloda 06/18/2014.  Restaging CTs on 07/14/2014 with no evidence of metastatic colon cancer  Colonoscopy 10/02/2014 with  diverticulosis in the sigmoid colon and in the descending colon. One 4 mm polyp in the sigmoid colon with complete resection. Polyp tissue not retrieved. Internal hemorrhoids. Next colonoscopy in 3 years for surveillance.  Restaging CTs 07/16/2015 with 2 new hypodense liver lesions concerning for metastases, no other evidence of disease progression  PET scan 07/28/2015 with a single hypermetabolic right liver lesion, no other evidence of metastatic disease  Ultrasound-guided biopsy of the right liver lesion 07/30/2015 confirmed metastatic colon cancer  MRI abdomen 08/20/2015 with similar to slight decrease in size of 2 hepatic metastasis. No new liver lesions or extrahepatic metastatic disease identified.  Radiofrequency ablation of 2 liver lesions 09/25/2015  MRI abdomen 02/02/2016-coagulative necrosis at the 2 ablated liver tumor sites,with no enhancement to suggest residual or recurrent liver tumor. No new liver masses or other findings of metastatic disease in the abdomen.  MRI abdomen 08/10/2016 revealed stable ablation sites, no evidence of progressive metastatic disease  Restaging CT 02/08/2017-stable treated liver lesions, no evidence of progressive metastatic disease, stable borderline enlarged right inguinal node  CEA with stable mild elevation 02/08/2017, 03/01/2017, 03/29/2017,04/19/2017  CEA further elevated 08/21/2017  CTs 09/05/2017-possible local recurrence of tumor along the ablation site right hepatic lobe lesion. Cephalad to the ablation site approximately 1.9 x 2.1cmsoft tissue mass progressive compared to the prior exam.  Liver SBRT 10/17/2017, 10/19/2017, 10/23/2017, 10/25/2017, 10/27/2017  CT 02/19/2018-progressive ill-defined areas of low attenuation with enhancement surrounding the segment 6 ablation site, stable segment 8 ablation site enlargement of extracapsular lesion posteriorly,  no new hepatic lesions, wall thickening/edema at the distal ileum  CT 05/22/2018-2 new  lung nodules suspicious for metastases, stable segment 8 ablation zone, slight decrease in the size of delayed perfusion at the segment 6 ablation zone, posterior extracapsular lesion is slightly smaller  CT chest 07/13/2018-multiple lung nodules consistent with metastatic disease  Cycle 1 Xeloda 07/23/2018  Cycle 2 Xeloda 08/13/2018  Cycle 3 Xeloda 09/03/2018  Cycle 4 Xeloda 09/24/2018  Restaging chest CT 10/12/2018-scattered pulmonary nodules/metastases mildly improved.  Cycle 5 Xeloda 10/15/2018  Cycle 6 Xeloda 11/05/2018  Cycle 7 Xeloda 11/26/2018  Cycle 8 Xeloda 12/17/2018  Cycle 9 Xeloda 01/07/2019  CT chest 01/24/2019- stable pulmonary nodules.  No new pulmonary nodules.  Stable left hepatic lobe cysts and right hepatic lobe lesions.  No new hepatic lesions.  Stable right ninth and 10th rib lesions.  Xeloda daily dosing schedule beginning 01/28/2019 due to hand-foot syndrome 2. Right ovary cystadenofibroma, status post a bilateral oophorectomy 10/22/2013 3. Remote history of cervical cancer. 4. Iron deficiency anemia-resolved. 5. Right calf deep vein thrombosis 10/24/2013-she completed 3 months of xarelto. 6. Indeterminate 12 mm hypermetabolic right pelvic density on the staging PET scan 10/04/2013-CT followup recommended per the GI tumor conference 11/13/2013. No longer visualized on a CT 07/14/2014. 7. Soft tissue implant overlying the posterior right liver on the CT 75/73/2256-HCS hypermetabolic on the PET scan 91/98/0221. 8. C. difficile colitis 03/05/2014. She completed a course of Flagyl, recurrent diarrhea beginning 03/21/2014, positive C. difficile toxin 03/26/2014-resumed Flagyl for planned 10 day course.  Vancomycin beginning 04/08/2014.  Taper complete 05/21/2014.  9.  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  Xeloda dose/schedule adjusted beginning 01/28/2019.  Disposition: Cynthia [name] appears stable.  There is no clinical evidence of  disease progression.  She will continue Xeloda on the current daily dosing schedule.  We will follow-up on the CEA from today.  The paronychia, changes of hand-foot syndrome are stable to improved.  We reviewed the CBC from today.  Counts adequate to continue with Xeloda as above.  She will return for lab and follow-up in 3 weeks.  She will contact the office in the interim with any problems.    Ned Card ANP/GNP-BC   02/14/2019  2:17 PM

## 2019-02-18 ENCOUNTER — Telehealth: Payer: Self-pay

## 2019-02-18 ENCOUNTER — Telehealth: Payer: Self-pay | Admitting: Oncology

## 2019-02-18 ENCOUNTER — Other Ambulatory Visit: Payer: Self-pay

## 2019-02-18 DIAGNOSIS — C189 Malignant neoplasm of colon, unspecified: Secondary | ICD-10-CM

## 2019-02-18 DIAGNOSIS — C787 Secondary malignant neoplasm of liver and intrahepatic bile duct: Secondary | ICD-10-CM

## 2019-02-18 NOTE — Telephone Encounter (Signed)
Spoke with pt in regards to results per Ned Card NP  Please let her know the CEA was higher. We will repeat at the time of her next visit in 3 weeks.   Pt verbalized understanding of results.

## 2019-02-18 NOTE — Telephone Encounter (Signed)
Called and spoke with patient. Confirmed date and time of appt  ° °

## 2019-02-18 NOTE — Telephone Encounter (Signed)
-----   Message from Owens Shark, NP sent at 02/14/2019  4:11 PM EDT ----- Please let her know the CEA was higher.  We will repeat at the time of her next visit in 3 weeks.

## 2019-02-19 ENCOUNTER — Other Ambulatory Visit: Payer: Self-pay | Admitting: Oncology

## 2019-02-21 MED FILL — CAPECITABINE 500 MG TABS: 500 | 30 days supply | Qty: 60 | Fill #0

## 2019-03-07 ENCOUNTER — Inpatient Hospital Stay: Payer: Medicare Other | Admitting: Oncology

## 2019-03-07 ENCOUNTER — Telehealth: Payer: Self-pay | Admitting: Oncology

## 2019-03-07 ENCOUNTER — Inpatient Hospital Stay: Payer: Medicare Other

## 2019-03-07 ENCOUNTER — Other Ambulatory Visit: Payer: Self-pay

## 2019-03-07 VITALS — BP 134/83 | HR 70 | Temp 98.2°F | Resp 18 | Ht 61.0 in | Wt 141.1 lb

## 2019-03-07 DIAGNOSIS — C787 Secondary malignant neoplasm of liver and intrahepatic bile duct: Secondary | ICD-10-CM | POA: Diagnosis not present

## 2019-03-07 DIAGNOSIS — C180 Malignant neoplasm of cecum: Secondary | ICD-10-CM | POA: Diagnosis not present

## 2019-03-07 DIAGNOSIS — C189 Malignant neoplasm of colon, unspecified: Secondary | ICD-10-CM

## 2019-03-07 DIAGNOSIS — Z8541 Personal history of malignant neoplasm of cervix uteri: Secondary | ICD-10-CM

## 2019-03-07 DIAGNOSIS — L271 Localized skin eruption due to drugs and medicaments taken internally: Secondary | ICD-10-CM

## 2019-03-07 DIAGNOSIS — C7800 Secondary malignant neoplasm of unspecified lung: Secondary | ICD-10-CM

## 2019-03-07 LAB — CBC WITH DIFFERENTIAL (CANCER CENTER ONLY)
Abs Immature Granulocytes: 0.02 10*3/uL (ref 0.00–0.07)
Basophils Absolute: 0 10*3/uL (ref 0.0–0.1)
Basophils Relative: 1 %
Eosinophils Absolute: 0.2 10*3/uL (ref 0.0–0.5)
Eosinophils Relative: 4 %
HCT: 35.5 % — ABNORMAL LOW (ref 36.0–46.0)
Hemoglobin: 11.7 g/dL — ABNORMAL LOW (ref 12.0–15.0)
Immature Granulocytes: 0 %
Lymphocytes Relative: 29 %
Lymphs Abs: 1.5 10*3/uL (ref 0.7–4.0)
MCH: 35.9 pg — ABNORMAL HIGH (ref 26.0–34.0)
MCHC: 33 g/dL (ref 30.0–36.0)
MCV: 108.9 fL — ABNORMAL HIGH (ref 80.0–100.0)
Monocytes Absolute: 0.5 10*3/uL (ref 0.1–1.0)
Monocytes Relative: 10 %
Neutro Abs: 3 10*3/uL (ref 1.7–7.7)
Neutrophils Relative %: 56 %
Platelet Count: 204 10*3/uL (ref 150–400)
RBC: 3.26 MIL/uL — ABNORMAL LOW (ref 3.87–5.11)
RDW: 17.1 % — ABNORMAL HIGH (ref 11.5–15.5)
WBC Count: 5.3 10*3/uL (ref 4.0–10.5)
nRBC: 0 % (ref 0.0–0.2)

## 2019-03-07 LAB — CMP (CANCER CENTER ONLY)
ALT: 9 U/L (ref 0–44)
AST: 20 U/L (ref 15–41)
Albumin: 3.5 g/dL (ref 3.5–5.0)
Alkaline Phosphatase: 73 U/L (ref 38–126)
Anion gap: 6 (ref 5–15)
BUN: 13 mg/dL (ref 8–23)
CO2: 28 mmol/L (ref 22–32)
Calcium: 9.3 mg/dL (ref 8.9–10.3)
Chloride: 108 mmol/L (ref 98–111)
Creatinine: 0.99 mg/dL (ref 0.44–1.00)
GFR, Est AFR Am: >60 mL/min (ref >60)
GFR, Estimated: 54 mL/min — ABNORMAL LOW (ref >60)
Glucose, Bld: 90 mg/dL (ref 70–99)
Potassium: 4.1 mmol/L (ref 3.5–5.1)
Sodium: 142 mmol/L (ref 135–145)
Total Bilirubin: 0.4 mg/dL (ref 0.3–1.2)
Total Protein: 6.8 g/dL (ref 6.5–8.1)

## 2019-03-07 LAB — CEA (IN HOUSE-CHCC): CEA (CHCC-In House): 4.89 ng/mL (ref 0.00–5.00)

## 2019-03-07 NOTE — Progress Notes (Signed)
[place] OFFICE PROGRESS NOTE   Diagnosis: Colon cancer  INTERVAL HISTORY:   [name] continues daily Xeloda.  No mouth sores or diarrhea.  Dryness at the feet.  No pain at the right chest wall area of induration.  Objective:  Vital signs in last 24 hours:  Blood pressure 134/83, pulse 70, temperature 98.2 F (36.8 C), temperature source Oral, resp. rate 18, height _0  (1.549 m), weight 141 lb 1.6 oz (64 kg), SpO2 99 %.     Limited physical examination secondary to distancing with the COVID pandemic Skin: Mild skin thickening and erythema at the palms and soles.  Dryness of the soles, mild induration at the right chest wall biopsy site.  No discrete mass.   Lab Results:  Lab Results  Component Value Date   WBC 5.3 03/07/2019   HGB 11.7 (L) 03/07/2019   HCT 35.5 (L) 03/07/2019   MCV 108.9 (H) 03/07/2019   PLT 204 03/07/2019   NEUTROABS 3.0 03/07/2019    CMP  Lab Results  Component Value Date   NA 142 03/07/2019   K 4.1 03/07/2019   CL 108 03/07/2019   CO2 28 03/07/2019   GLUCOSE 90 03/07/2019   BUN 13 03/07/2019   CREATININE 0.99 03/07/2019   CALCIUM 9.3 03/07/2019   PROT 6.8 03/07/2019   ALBUMIN 3.5 03/07/2019   AST 20 03/07/2019   ALT 9 03/07/2019   ALKPHOS 73 03/07/2019   BILITOT 0.4 03/07/2019   GFRNONAA 54 (L) 03/07/2019   GFRAA >60 03/07/2019    Lab Results  Component Value Date   CEA1 4.89 03/07/2019     Medications: I have reviewed the patient's current medications.   Assessment/Plan: 1. Moderately differentiated adenocarcinoma of the cecum, stage III (T4a, N1), status post a laparoscopic assisted right colectomy 10/22/2013  The tumor returned microsatellite stable with normal mismatch repair protein expression   Cycle 1 adjuvant Xeloda 12/25/2013.   Cycle 2 adjuvant Xeloda 01/15/2014.   Cycle 3 adjuvant Xeloda 02/05/2014.   Cycle 4 adjuvant Xeloda 03/18/2014, discontinued after 4 days secondary to  diarrhea   Cycle 5 adjuvant Xeloda 04/16/2014, Xeloda dose reduced to 1000 mg in the a.m. and 500 mg in the p.m.   Cycle 6 adjuvant Xeloda 05/07/2014.  Cycle 7 adjuvant Xeloda 05/28/2014.  Cycle 8 adjuvant Xeloda 06/18/2014.  Restaging CTs on 07/14/2014 with no evidence of metastatic colon cancer  Colonoscopy 10/02/2014 with diverticulosis in the sigmoid colon and in the descending colon. One 4 mm polyp in the sigmoid colon with complete resection. Polyp tissue not retrieved. Internal hemorrhoids. Next colonoscopy in 3 years for surveillance.  Restaging CTs 07/16/2015 with 2 new hypodense liver lesions concerning for metastases, no other evidence of disease progression  PET scan 07/28/2015 with a single hypermetabolic right liver lesion, no other evidence of metastatic disease  Ultrasound-guided biopsy of the right liver lesion 07/30/2015 confirmed metastatic colon cancer  MRI abdomen 08/20/2015 with similar to slight decrease in size of 2 hepatic metastasis. No new liver lesions or extrahepatic metastatic disease identified.  Radiofrequency ablation of 2 liver lesions 09/25/2015  MRI abdomen 02/02/2016-coagulative necrosis at the 2 ablated liver tumor sites,with no enhancement to suggest residual or recurrent liver tumor. No new liver masses or other findings of metastatic disease in the abdomen.  MRI abdomen 08/10/2016 revealed stable ablation sites, no evidence of progressive metastatic disease  Restaging CT 02/08/2017-stable treated liver lesions, no evidence of progressive metastatic disease, stable borderline enlarged right inguinal node  CEA with stable mild elevation 02/08/2017, 03/01/2017, 03/29/2017,04/19/2017  CEA further elevated 08/21/2017  CTs 09/05/2017-possible local recurrence of tumor along the ablation site right hepatic lobe lesion. Cephalad to the ablation site approximately 1.9 x 2.1cmsoft tissue mass progressive compared to the prior exam.  Liver SBRT  10/17/2017, 10/19/2017, 10/23/2017, 10/25/2017, 10/27/2017  CT 02/19/2018-progressive ill-defined areas of low attenuation with enhancement surrounding the segment 6 ablation site, stable segment 8 ablation site enlargement of extracapsular lesion posteriorly, no new hepatic lesions, wall thickening/edema at the distal ileum  CT 05/22/2018-2 new lung nodules suspicious for metastases, stable segment 8 ablation zone, slight decrease in the size of delayed perfusion at the segment 6 ablation zone, posterior extracapsular lesion is slightly smaller  CT chest 07/13/2018-multiple lung nodules consistent with metastatic disease  Cycle 1 Xeloda 07/23/2018  Cycle 2 Xeloda 08/13/2018  Cycle 3 Xeloda 09/03/2018  Cycle 4 Xeloda 09/24/2018  Restaging chest CT 10/12/2018-scattered pulmonary nodules/metastases mildly improved.  Cycle 5 Xeloda 10/15/2018  Cycle 6 Xeloda 11/05/2018  Cycle 7 Xeloda 11/26/2018  Cycle 8 Xeloda 12/17/2018  Cycle 9 Xeloda 01/07/2019  CT chest 01/24/2019- stable pulmonary nodules.  No new pulmonary nodules.  Stable left hepatic lobe cysts and right hepatic lobe lesions.  No new hepatic lesions.  Stable right ninth and 10th rib lesions.  Xeloda daily dosing schedule beginning 01/28/2019 due to hand-foot syndrome 2. Right ovary cystadenofibroma, status post a bilateral oophorectomy 10/22/2013 3. Remote history of cervical cancer. 4. Iron deficiency anemia-resolved. 5. Right calf deep vein thrombosis 10/24/2013-she completed 3 months of xarelto. 6. Indeterminate 12 mm hypermetabolic right pelvic density on the staging PET scan 10/04/2013-CT followup recommended per the GI tumor conference 11/13/2013. No longer visualized on a CT 07/14/2014. 7. Soft tissue implant overlying the posterior right liver on the CT 70/92/9574-BBU hypermetabolic on the PET scan 03/70/9643. 8. C. difficile colitis 03/05/2014. She completed a course of Flagyl, recurrent diarrhea beginning 03/21/2014, positive C.  difficile toxin 03/26/2014-resumed Flagyl for planned 10 day course.  Vancomycin beginning 04/08/2014.  Taper complete 05/21/2014.  9.  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  Xeloda dose/schedule adjusted beginning 01/28/2019.    Disposition: [name] appears stable.  The hand/foot symptoms are mild.  She will continue daily Xeloda.  The CEA is lower today.  She will return for an office and lab visit in 1 month.  [name], MD  03/07/2019  3:30 PM

## 2019-03-07 NOTE — Telephone Encounter (Signed)
Gave avs and calendar ° °

## 2019-04-03 ENCOUNTER — Telehealth: Payer: Self-pay | Admitting: Oncology

## 2019-04-03 ENCOUNTER — Inpatient Hospital Stay: Payer: Medicare Other | Attending: Oncology | Admitting: Oncology

## 2019-04-03 ENCOUNTER — Inpatient Hospital Stay: Payer: Medicare Other

## 2019-04-03 ENCOUNTER — Other Ambulatory Visit: Payer: Self-pay

## 2019-04-03 VITALS — BP 168/98 | HR 75 | Temp 98.9°F | Resp 18 | Ht 61.0 in | Wt 141.5 lb

## 2019-04-03 DIAGNOSIS — L271 Localized skin eruption due to drugs and medicaments taken internally: Secondary | ICD-10-CM | POA: Diagnosis not present

## 2019-04-03 DIAGNOSIS — Z8541 Personal history of malignant neoplasm of cervix uteri: Secondary | ICD-10-CM | POA: Diagnosis not present

## 2019-04-03 DIAGNOSIS — C189 Malignant neoplasm of colon, unspecified: Secondary | ICD-10-CM

## 2019-04-03 DIAGNOSIS — C787 Secondary malignant neoplasm of liver and intrahepatic bile duct: Secondary | ICD-10-CM | POA: Insufficient documentation

## 2019-04-03 DIAGNOSIS — C7800 Secondary malignant neoplasm of unspecified lung: Secondary | ICD-10-CM | POA: Insufficient documentation

## 2019-04-03 DIAGNOSIS — C180 Malignant neoplasm of cecum: Secondary | ICD-10-CM | POA: Diagnosis present

## 2019-04-03 LAB — CBC WITH DIFFERENTIAL (CANCER CENTER ONLY)
Abs Immature Granulocytes: 0.01 10*3/uL (ref 0.00–0.07)
Basophils Absolute: 0 10*3/uL (ref 0.0–0.1)
Basophils Relative: 1 %
Eosinophils Absolute: 0.2 10*3/uL (ref 0.0–0.5)
Eosinophils Relative: 3 %
HCT: 36.6 % (ref 36.0–46.0)
Hemoglobin: 11.9 g/dL — ABNORMAL LOW (ref 12.0–15.0)
Immature Granulocytes: 0 %
Lymphocytes Relative: 21 %
Lymphs Abs: 1.2 10*3/uL (ref 0.7–4.0)
MCH: 34.8 pg — ABNORMAL HIGH (ref 26.0–34.0)
MCHC: 32.5 g/dL (ref 30.0–36.0)
MCV: 107 fL — ABNORMAL HIGH (ref 80.0–100.0)
Monocytes Absolute: 0.4 10*3/uL (ref 0.1–1.0)
Monocytes Relative: 7 %
Neutro Abs: 3.8 10*3/uL (ref 1.7–7.7)
Neutrophils Relative %: 68 %
Platelet Count: 215 10*3/uL (ref 150–400)
RBC: 3.42 MIL/uL — ABNORMAL LOW (ref 3.87–5.11)
RDW: 16.2 % — ABNORMAL HIGH (ref 11.5–15.5)
WBC Count: 5.6 10*3/uL (ref 4.0–10.5)
nRBC: 0 % (ref 0.0–0.2)

## 2019-04-03 LAB — CMP (CANCER CENTER ONLY)
ALT: 10 U/L (ref 0–44)
AST: 21 U/L (ref 15–41)
Albumin: 3.6 g/dL (ref 3.5–5.0)
Alkaline Phosphatase: 64 U/L (ref 38–126)
Anion gap: 6 (ref 5–15)
BUN: 13 mg/dL (ref 8–23)
CO2: 28 mmol/L (ref 22–32)
Calcium: 9.2 mg/dL (ref 8.9–10.3)
Chloride: 108 mmol/L (ref 98–111)
Creatinine: 0.91 mg/dL (ref 0.44–1.00)
GFR, Est AFR Am: >60 mL/min (ref >60)
GFR, Estimated: 60 mL/min — ABNORMAL LOW (ref >60)
Glucose, Bld: 86 mg/dL (ref 70–99)
Potassium: 4.3 mmol/L (ref 3.5–5.1)
Sodium: 142 mmol/L (ref 135–145)
Total Bilirubin: 0.6 mg/dL (ref 0.3–1.2)
Total Protein: 6.9 g/dL (ref 6.5–8.1)

## 2019-04-03 LAB — CEA (IN HOUSE-CHCC): CEA (CHCC-In House): 2.98 ng/mL (ref 0.00–5.00)

## 2019-04-03 MED ORDER — CAPECITABINE 500 MG PO TABS: 500.0000 mg | ORAL_TABLET | Freq: Two times a day (BID) | ORAL | 0 refills | Status: DC

## 2019-04-03 NOTE — Telephone Encounter (Signed)
Gave avs and calendar ° °

## 2019-04-03 NOTE — Progress Notes (Signed)
[place] OFFICE PROGRESS NOTE   Diagnosis: Colon cancer  INTERVAL HISTORY:   [name] returns as scheduled.  She continues Xeloda.  No mouth sores or diarrhea.  Good appetite.  She has dryness at the hands and feet.  No pain.  Objective:  Vital signs in last 24 hours:  Blood pressure (!) 168/98, pulse 75, temperature 98.9 F (37.2 C), temperature source Oral, resp. rate 18, height 5' 1"  (1.549 m), weight 141 lb 8 oz (64.2 kg), SpO2 100 %.   Limited examination secondary to distancing with the COVID pandemic HEENT: No thrush or ulcers Vascular: No leg edema  Skin: No erythema at the palms, dryness and erythema at the soles- no skin breakdown.  Area of induration and skin thickening at the right lateral chest wall.  No discrete mass.    Lab Results:  Lab Results  Component Value Date   WBC 5.6 04/03/2019   HGB 11.9 (L) 04/03/2019   HCT 36.6 04/03/2019   MCV 107.0 (H) 04/03/2019   PLT 215 04/03/2019   NEUTROABS 3.8 04/03/2019    CMP  Lab Results  Component Value Date   NA 142 04/03/2019   K 4.3 04/03/2019   CL 108 04/03/2019   CO2 28 04/03/2019   GLUCOSE 86 04/03/2019   BUN 13 04/03/2019   CREATININE 0.91 04/03/2019   CALCIUM 9.2 04/03/2019   PROT 6.9 04/03/2019   ALBUMIN 3.6 04/03/2019   AST 21 04/03/2019   ALT 10 04/03/2019   ALKPHOS 64 04/03/2019   BILITOT 0.6 04/03/2019   GFRNONAA 60 (L) 04/03/2019   GFRAA >60 04/03/2019    Lab Results  Component Value Date   CEA1 2.98 04/03/2019    Medications: I have reviewed the patient's current medications.   Assessment/Plan: 1. Moderately differentiated adenocarcinoma of the cecum, stage III (T4a, N1), status post a laparoscopic assisted right colectomy 10/22/2013  The tumor returned microsatellite stable with normal mismatch repair protein expression   Cycle 1 adjuvant Xeloda 12/25/2013.   Cycle 2 adjuvant Xeloda 01/15/2014.   Cycle 3 adjuvant Xeloda 02/05/2014.   Cycle 4  adjuvant Xeloda 03/18/2014, discontinued after 4 days secondary to diarrhea   Cycle 5 adjuvant Xeloda 04/16/2014, Xeloda dose reduced to 1000 mg in the a.m. and 500 mg in the p.m.   Cycle 6 adjuvant Xeloda 05/07/2014.  Cycle 7 adjuvant Xeloda 05/28/2014.  Cycle 8 adjuvant Xeloda 06/18/2014.  Restaging CTs on 07/14/2014 with no evidence of metastatic colon cancer  Colonoscopy 10/02/2014 with diverticulosis in the sigmoid colon and in the descending colon. One 4 mm polyp in the sigmoid colon with complete resection. Polyp tissue not retrieved. Internal hemorrhoids. Next colonoscopy in 3 years for surveillance.  Restaging CTs 07/16/2015 with 2 new hypodense liver lesions concerning for metastases, no other evidence of disease progression  PET scan 07/28/2015 with a single hypermetabolic right liver lesion, no other evidence of metastatic disease  Ultrasound-guided biopsy of the right liver lesion 07/30/2015 confirmed metastatic colon cancer  MRI abdomen 08/20/2015 with similar to slight decrease in size of 2 hepatic metastasis. No new liver lesions or extrahepatic metastatic disease identified.  Radiofrequency ablation of 2 liver lesions 09/25/2015  MRI abdomen 02/02/2016-coagulative necrosis at the 2 ablated liver tumor sites,with no enhancement to suggest residual or recurrent liver tumor. No new liver masses or other findings of metastatic disease in the abdomen.  MRI abdomen 08/10/2016 revealed stable ablation sites, no evidence of progressive metastatic disease  Restaging CT 02/08/2017-stable treated liver lesions,  no evidence of progressive metastatic disease, stable borderline enlarged right inguinal node  CEA with stable mild elevation 02/08/2017, 03/01/2017, 03/29/2017,04/19/2017  CEA further elevated 08/21/2017  CTs 09/05/2017-possible local recurrence of tumor along the ablation site right hepatic lobe lesion. Cephalad to the ablation site approximately 1.9 x 2.1cmsoft  tissue mass progressive compared to the prior exam.  Liver SBRT 10/17/2017, 10/19/2017, 10/23/2017, 10/25/2017, 10/27/2017  CT 02/19/2018-progressive ill-defined areas of low attenuation with enhancement surrounding the segment 6 ablation site, stable segment 8 ablation site enlargement of extracapsular lesion posteriorly, no new hepatic lesions, wall thickening/edema at the distal ileum  CT 05/22/2018-2 new lung nodules suspicious for metastases, stable segment 8 ablation zone, slight decrease in the size of delayed perfusion at the segment 6 ablation zone, posterior extracapsular lesion is slightly smaller  CT chest 07/13/2018-multiple lung nodules consistent with metastatic disease  Cycle 1 Xeloda 07/23/2018  Cycle 2 Xeloda 08/13/2018  Cycle 3 Xeloda 09/03/2018  Cycle 4 Xeloda 09/24/2018  Restaging chest CT 10/12/2018-scattered pulmonary nodules/metastases mildly improved.  Cycle 5 Xeloda 10/15/2018  Cycle 6 Xeloda 11/05/2018  Cycle 7 Xeloda 11/26/2018  Cycle 8 Xeloda 12/17/2018  Cycle 9 Xeloda 01/07/2019  CT chest 01/24/2019- stable pulmonary nodules.  No new pulmonary nodules.  Stable left hepatic lobe cysts and right hepatic lobe lesions.  No new hepatic lesions.  Stable right ninth and 10th rib lesions.  Xeloda daily dosing schedule beginning 01/28/2019 due to hand-foot syndrome 2. Right ovary cystadenofibroma, status post a bilateral oophorectomy 10/22/2013 3. Remote history of cervical cancer. 4. Iron deficiency anemia-resolved. 5. Right calf deep vein thrombosis 10/24/2013-she completed 3 months of xarelto. 6. Indeterminate 12 mm hypermetabolic right pelvic density on the staging PET scan 10/04/2013-CT followup recommended per the GI tumor conference 11/13/2013. No longer visualized on a CT 07/14/2014. 7. Soft tissue implant overlying the posterior right liver on the CT 23/30/0762-UQJ hypermetabolic on the PET scan 33/54/5625. 8. C. difficile colitis 03/05/2014. She completed a course  of Flagyl, recurrent diarrhea beginning 03/21/2014, positive C. difficile toxin 03/26/2014-resumed Flagyl for planned 10 day course.  Vancomycin beginning 04/08/2014.  Taper complete 05/21/2014.  9.  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  Xeloda dose/schedule adjusted beginning 01/28/2019.     Disposition: Cynthia [name] appears stable.  There is no clinical evidence of disease progression.  She will continue Xeloda at the current dose.  She will return for office and lab visit in 1 month.  We will plan for a restaging CT evaluation in approximately 2 months.  [name], MD  04/03/2019  1:41 PM

## 2019-04-04 MED FILL — CAPECITABINE 500 MG TABS: 500 | 30 days supply | Qty: 60 | Fill #0

## 2019-04-30 ENCOUNTER — Other Ambulatory Visit: Payer: Self-pay

## 2019-04-30 ENCOUNTER — Encounter: Payer: Self-pay | Admitting: Nurse Practitioner

## 2019-04-30 ENCOUNTER — Telehealth: Payer: Self-pay | Admitting: Oncology

## 2019-04-30 ENCOUNTER — Inpatient Hospital Stay: Payer: Medicare Other

## 2019-04-30 ENCOUNTER — Inpatient Hospital Stay: Payer: Medicare Other | Attending: Oncology | Admitting: Nurse Practitioner

## 2019-04-30 VITALS — BP 156/84 | HR 57 | Temp 98.0°F | Resp 17 | Ht 61.0 in | Wt 142.7 lb

## 2019-04-30 DIAGNOSIS — C7800 Secondary malignant neoplasm of unspecified lung: Secondary | ICD-10-CM | POA: Diagnosis not present

## 2019-04-30 DIAGNOSIS — L271 Localized skin eruption due to drugs and medicaments taken internally: Secondary | ICD-10-CM | POA: Diagnosis not present

## 2019-04-30 DIAGNOSIS — C787 Secondary malignant neoplasm of liver and intrahepatic bile duct: Secondary | ICD-10-CM | POA: Diagnosis not present

## 2019-04-30 DIAGNOSIS — Z8541 Personal history of malignant neoplasm of cervix uteri: Secondary | ICD-10-CM | POA: Insufficient documentation

## 2019-04-30 DIAGNOSIS — C189 Malignant neoplasm of colon, unspecified: Secondary | ICD-10-CM

## 2019-04-30 DIAGNOSIS — C180 Malignant neoplasm of cecum: Secondary | ICD-10-CM | POA: Insufficient documentation

## 2019-04-30 LAB — CBC WITH DIFFERENTIAL (CANCER CENTER ONLY)
Abs Immature Granulocytes: 0.02 10*3/uL (ref 0.00–0.07)
Basophils Absolute: 0 10*3/uL (ref 0.0–0.1)
Basophils Relative: 0 %
Eosinophils Absolute: 0.1 10*3/uL (ref 0.0–0.5)
Eosinophils Relative: 3 %
HCT: 35.9 % — ABNORMAL LOW (ref 36.0–46.0)
Hemoglobin: 11.6 g/dL — ABNORMAL LOW (ref 12.0–15.0)
Immature Granulocytes: 0 %
Lymphocytes Relative: 25 %
Lymphs Abs: 1.2 10*3/uL (ref 0.7–4.0)
MCH: 34.7 pg — ABNORMAL HIGH (ref 26.0–34.0)
MCHC: 32.3 g/dL (ref 30.0–36.0)
MCV: 107.5 fL — ABNORMAL HIGH (ref 80.0–100.0)
Monocytes Absolute: 0.4 10*3/uL (ref 0.1–1.0)
Monocytes Relative: 8 %
Neutro Abs: 3.1 10*3/uL (ref 1.7–7.7)
Neutrophils Relative %: 64 %
Platelet Count: 196 10*3/uL (ref 150–400)
RBC: 3.34 MIL/uL — ABNORMAL LOW (ref 3.87–5.11)
RDW: 16.8 % — ABNORMAL HIGH (ref 11.5–15.5)
WBC Count: 4.9 10*3/uL (ref 4.0–10.5)
nRBC: 0 % (ref 0.0–0.2)

## 2019-04-30 LAB — CMP (CANCER CENTER ONLY)
ALT: 10 U/L (ref 0–44)
AST: 22 U/L (ref 15–41)
Albumin: 3.6 g/dL (ref 3.5–5.0)
Alkaline Phosphatase: 55 U/L (ref 38–126)
Anion gap: 5 (ref 5–15)
BUN: 14 mg/dL (ref 8–23)
CO2: 28 mmol/L (ref 22–32)
Calcium: 8.8 mg/dL — ABNORMAL LOW (ref 8.9–10.3)
Chloride: 107 mmol/L (ref 98–111)
Creatinine: 1.02 mg/dL — ABNORMAL HIGH (ref 0.44–1.00)
GFR, Est AFR Am: >60 mL/min (ref >60)
GFR, Estimated: 52 mL/min — ABNORMAL LOW (ref >60)
Glucose, Bld: 83 mg/dL (ref 70–99)
Potassium: 4.2 mmol/L (ref 3.5–5.1)
Sodium: 140 mmol/L (ref 135–145)
Total Bilirubin: 0.5 mg/dL (ref 0.3–1.2)
Total Protein: 6.5 g/dL (ref 6.5–8.1)

## 2019-04-30 LAB — CEA (IN HOUSE-CHCC): CEA (CHCC-In House): 3.35 ng/mL (ref 0.00–5.00)

## 2019-04-30 NOTE — Progress Notes (Signed)
[place] OFFICE PROGRESS NOTE   Diagnosis: Colon cancer  INTERVAL HISTORY:   Cynthia [name] returns as scheduled.  She continues Xeloda.  She overall feels well.  Hands and feet continue to be dry, no pain.  No mouth sores.  She has a single loose stool daily.  No nausea or vomiting.  Objective:  Vital signs in last 24 hours:  Blood pressure (!) 156/84, pulse (!) 57, temperature 98 F (36.7 C), temperature source Oral, resp. rate 17, height 5' 1"  (1.549 m), weight 142 lb 11.2 oz (64.7 kg), SpO2 98 %.    HEENT: No thrush or ulcers. GI: Abdomen soft and nontender.  No hepatomegaly. Vascular: No leg edema.  Calf soft and nontender. Neuro: Alert and oriented. Skin: Palms with skin thickening, no erythema.  Soles with mild erythema, dryness.  No skin breakdown.  Area of induration/skin thickening right lateral chest wall.   Lab Results:  Lab Results  Component Value Date   WBC 4.9 04/30/2019   HGB 11.6 (L) 04/30/2019   HCT 35.9 (L) 04/30/2019   MCV 107.5 (H) 04/30/2019   PLT 196 04/30/2019   NEUTROABS 3.1 04/30/2019    Imaging:  No results found.  Medications: I have reviewed the patient's current medications.  Assessment/Plan: 1. Moderately differentiated adenocarcinoma of the cecum, stage III (T4a, N1), status post a laparoscopic assisted right colectomy 10/22/2013  The tumor returned microsatellite stable with normal mismatch repair protein expression   Cycle 1 adjuvant Xeloda 12/25/2013.   Cycle 2 adjuvant Xeloda 01/15/2014.   Cycle 3 adjuvant Xeloda 02/05/2014.   Cycle 4 adjuvant Xeloda 03/18/2014, discontinued after 4 days secondary to diarrhea   Cycle 5 adjuvant Xeloda 04/16/2014, Xeloda dose reduced to 1000 mg in the a.m. and 500 mg in the p.m.   Cycle 6 adjuvant Xeloda 05/07/2014.  Cycle 7 adjuvant Xeloda 05/28/2014.  Cycle 8 adjuvant Xeloda 06/18/2014.  Restaging CTs on 07/14/2014 with no evidence of metastatic colon cancer   Colonoscopy 10/02/2014 with diverticulosis in the sigmoid colon and in the descending colon. One 4 mm polyp in the sigmoid colon with complete resection. Polyp tissue not retrieved. Internal hemorrhoids. Next colonoscopy in 3 years for surveillance.  Restaging CTs 07/16/2015 with 2 new hypodense liver lesions concerning for metastases, no other evidence of disease progression  PET scan 07/28/2015 with a single hypermetabolic right liver lesion, no other evidence of metastatic disease  Ultrasound-guided biopsy of the right liver lesion 07/30/2015 confirmed metastatic colon cancer  MRI abdomen 08/20/2015 with similar to slight decrease in size of 2 hepatic metastasis. No new liver lesions or extrahepatic metastatic disease identified.  Radiofrequency ablation of 2 liver lesions 09/25/2015  MRI abdomen 02/02/2016-coagulative necrosis at the 2 ablated liver tumor sites,with no enhancement to suggest residual or recurrent liver tumor. No new liver masses or other findings of metastatic disease in the abdomen.  MRI abdomen 08/10/2016 revealed stable ablation sites, no evidence of progressive metastatic disease  Restaging CT 02/08/2017-stable treated liver lesions, no evidence of progressive metastatic disease, stable borderline enlarged right inguinal node  CEA with stable mild elevation 02/08/2017, 03/01/2017, 03/29/2017,04/19/2017  CEA further elevated 08/21/2017  CTs 09/05/2017-possible local recurrence of tumor along the ablation site right hepatic lobe lesion. Cephalad to the ablation site approximately 1.9 x 2.1cmsoft tissue mass progressive compared to the prior exam.  Liver SBRT 10/17/2017, 10/19/2017, 10/23/2017, 10/25/2017, 10/27/2017  CT 02/19/2018-progressive ill-defined areas of low attenuation with enhancement surrounding the segment 6 ablation site, stable segment 8 ablation site  enlargement of extracapsular lesion posteriorly, no new hepatic lesions, wall thickening/edema at the  distal ileum  CT 05/22/2018-2 new lung nodules suspicious for metastases, stable segment 8 ablation zone, slight decrease in the size of delayed perfusion at the segment 6 ablation zone, posterior extracapsular lesion is slightly smaller  CT chest 07/13/2018-multiple lung nodules consistent with metastatic disease  Cycle 1 Xeloda 07/23/2018  Cycle 2 Xeloda 08/13/2018  Cycle 3 Xeloda 09/03/2018  Cycle 4 Xeloda 09/24/2018  Restaging chest CT 10/12/2018-scattered pulmonary nodules/metastases mildly improved.  Cycle 5 Xeloda 10/15/2018  Cycle 6 Xeloda 11/05/2018  Cycle 7 Xeloda 11/26/2018  Cycle 8 Xeloda 12/17/2018  Cycle 9 Xeloda 01/07/2019  CT chest 01/24/2019-stable pulmonary nodules. No new pulmonary nodules. Stable left hepatic lobe cysts and right hepatic lobe lesions. No new hepatic lesions. Stable right ninth and 10th rib lesions.  Xeloda daily dosing schedule beginning 01/28/2019 due to hand-foot syndrome 2. Right ovary cystadenofibroma, status post a bilateral oophorectomy 10/22/2013 3. Remote history of cervical cancer. 4. Iron deficiency anemia-resolved. 5. Right calf deep vein thrombosis 10/24/2013-she completed 3 months of xarelto. 6. Indeterminate 12 mm hypermetabolic right pelvic density on the staging PET scan 10/04/2013-CT followup recommended per the GI tumor conference 11/13/2013. No longer visualized on a CT 07/14/2014. 7. Soft tissue implant overlying the posterior right liver on the CT 94/50/3888-KCM hypermetabolic on the PET scan 03/49/1791. 8. C. difficile colitis 03/05/2014. She completed a course of Flagyl, recurrent diarrhea beginning 03/21/2014, positive C. difficile toxin 03/26/2014-resumed Flagyl for planned 10 day course.  Vancomycin beginning 04/08/2014.  Taper complete 05/21/2014.  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 Xeloda dose/schedule adjusted beginning 01/28/2019.   Disposition: Cynthia [name] appears  stable.  There is no clinical evidence of disease progression.  She will continue Xeloda on the current dose/schedule.  She will undergo a restaging chest CT prior to her next visit.  We reviewed the CBC from today.  Counts adequate to continue with Xeloda as above.  She will return for lab and follow-up in 4 weeks.  She will contact the office in the interim with any problems.    Ned Card ANP/GNP-BC   04/30/2019  9:50 AM

## 2019-04-30 NOTE — Telephone Encounter (Signed)
Scheduled per 09/15 los, patient received after visit summary and calender. °

## 2019-05-01 ENCOUNTER — Other Ambulatory Visit: Payer: Self-pay | Admitting: Oncology

## 2019-05-02 MED FILL — CAPECITABINE 500 MG TABS: 500 | 30 days supply | Qty: 60 | Fill #0

## 2019-05-27 ENCOUNTER — Encounter (HOSPITAL_COMMUNITY): Payer: Self-pay

## 2019-05-27 ENCOUNTER — Other Ambulatory Visit: Payer: Self-pay

## 2019-05-27 ENCOUNTER — Ambulatory Visit (HOSPITAL_COMMUNITY)
Admission: RE | Admit: 2019-05-27 | Discharge: 2019-05-27 | Disposition: A | Discharge status: Home / Self Care | Payer: Medicare Other | Source: Ambulatory Visit | Attending: Nurse Practitioner | Admitting: Nurse Practitioner

## 2019-05-27 DIAGNOSIS — C787 Secondary malignant neoplasm of liver and intrahepatic bile duct: Secondary | ICD-10-CM | POA: Diagnosis present

## 2019-05-27 DIAGNOSIS — C189 Malignant neoplasm of colon, unspecified: Secondary | ICD-10-CM | POA: Diagnosis present

## 2019-05-28 ENCOUNTER — Telehealth: Payer: Self-pay | Admitting: Oncology

## 2019-05-28 ENCOUNTER — Encounter: Payer: Self-pay | Admitting: *Deleted

## 2019-05-28 ENCOUNTER — Inpatient Hospital Stay: Payer: Medicare Other | Admitting: Oncology

## 2019-05-28 ENCOUNTER — Inpatient Hospital Stay: Payer: Medicare Other | Attending: Oncology

## 2019-05-28 ENCOUNTER — Other Ambulatory Visit: Payer: Self-pay

## 2019-05-28 VITALS — BP 154/84 | HR 62 | Temp 98.5°F | Resp 18 | Ht 61.0 in | Wt 143.2 lb

## 2019-05-28 DIAGNOSIS — G629 Polyneuropathy, unspecified: Secondary | ICD-10-CM | POA: Insufficient documentation

## 2019-05-28 DIAGNOSIS — C7800 Secondary malignant neoplasm of unspecified lung: Secondary | ICD-10-CM | POA: Insufficient documentation

## 2019-05-28 DIAGNOSIS — Z86718 Personal history of other venous thrombosis and embolism: Secondary | ICD-10-CM | POA: Diagnosis not present

## 2019-05-28 DIAGNOSIS — Z8541 Personal history of malignant neoplasm of cervix uteri: Secondary | ICD-10-CM | POA: Insufficient documentation

## 2019-05-28 DIAGNOSIS — C180 Malignant neoplasm of cecum: Secondary | ICD-10-CM | POA: Insufficient documentation

## 2019-05-28 DIAGNOSIS — C189 Malignant neoplasm of colon, unspecified: Secondary | ICD-10-CM

## 2019-05-28 DIAGNOSIS — R234 Changes in skin texture: Secondary | ICD-10-CM | POA: Insufficient documentation

## 2019-05-28 DIAGNOSIS — C787 Secondary malignant neoplasm of liver and intrahepatic bile duct: Secondary | ICD-10-CM

## 2019-05-28 LAB — CMP (CANCER CENTER ONLY)
ALT: 11 U/L (ref 0–44)
AST: 24 U/L (ref 15–41)
Albumin: 3.6 g/dL (ref 3.5–5.0)
Alkaline Phosphatase: 58 U/L (ref 38–126)
Anion gap: 7 (ref 5–15)
BUN: 14 mg/dL (ref 8–23)
CO2: 28 mmol/L (ref 22–32)
Calcium: 8.9 mg/dL (ref 8.9–10.3)
Chloride: 107 mmol/L (ref 98–111)
Creatinine: 0.96 mg/dL (ref 0.44–1.00)
GFR, Est AFR Am: >60 mL/min (ref >60)
GFR, Estimated: 56 mL/min — ABNORMAL LOW (ref >60)
Glucose, Bld: 81 mg/dL (ref 70–99)
Potassium: 4.3 mmol/L (ref 3.5–5.1)
Sodium: 142 mmol/L (ref 135–145)
Total Bilirubin: 0.6 mg/dL (ref 0.3–1.2)
Total Protein: 6.8 g/dL (ref 6.5–8.1)

## 2019-05-28 LAB — CBC WITH DIFFERENTIAL (CANCER CENTER ONLY)
Abs Immature Granulocytes: 0 10*3/uL (ref 0.00–0.07)
Basophils Absolute: 0 10*3/uL (ref 0.0–0.1)
Basophils Relative: 0 %
Eosinophils Absolute: 0.2 10*3/uL (ref 0.0–0.5)
Eosinophils Relative: 3 %
HCT: 36.5 % (ref 36.0–46.0)
Hemoglobin: 12.1 g/dL (ref 12.0–15.0)
Immature Granulocytes: 0 %
Lymphocytes Relative: 26 %
Lymphs Abs: 1.3 10*3/uL (ref 0.7–4.0)
MCH: 35.7 pg — ABNORMAL HIGH (ref 26.0–34.0)
MCHC: 33.2 g/dL (ref 30.0–36.0)
MCV: 107.7 fL — ABNORMAL HIGH (ref 80.0–100.0)
Monocytes Absolute: 0.5 10*3/uL (ref 0.1–1.0)
Monocytes Relative: 10 %
Neutro Abs: 2.9 10*3/uL (ref 1.7–7.7)
Neutrophils Relative %: 61 %
Platelet Count: 204 10*3/uL (ref 150–400)
RBC: 3.39 MIL/uL — ABNORMAL LOW (ref 3.87–5.11)
RDW: 16.8 % — ABNORMAL HIGH (ref 11.5–15.5)
WBC Count: 4.8 10*3/uL (ref 4.0–10.5)
nRBC: 0 % (ref 0.0–0.2)

## 2019-05-28 LAB — CEA (IN HOUSE-CHCC): CEA (CHCC-In House): 3.06 ng/mL (ref 0.00–5.00)

## 2019-05-28 NOTE — Progress Notes (Signed)
[place] OFFICE PROGRESS NOTE   Diagnosis: Colon cancer  INTERVAL HISTORY:   Cynthia [name] continues daily Xeloda.  No mouth sores, hand/foot pain, or diarrhea.  She reports stable induration of the right chest wall.  No new complaint.  Objective:  Vital signs in last 24 hours:  Blood pressure (!) 154/84, pulse 62, temperature 98.5 F (36.9 C), temperature source Temporal, resp. rate 18, height 5' 1"  (1.549 m), weight 143 lb 3.2 oz (65 kg), SpO2 97 %.    HEENT: No thrush or ulcers GI: No hepatomegaly Vascular: No leg edema  Skin: Palms without erythema, skin thickening over area 3-4 cm at the right lateral chest wall, no discrete mass    Lab Results:  Lab Results  Component Value Date   WBC 4.8 05/28/2019   HGB 12.1 05/28/2019   HCT 36.5 05/28/2019   MCV 107.7 (H) 05/28/2019   PLT 204 05/28/2019   NEUTROABS 2.9 05/28/2019    CMP  Lab Results  Component Value Date   NA 142 05/28/2019   K 4.3 05/28/2019   CL 107 05/28/2019   CO2 28 05/28/2019   GLUCOSE 81 05/28/2019   BUN 14 05/28/2019   CREATININE 0.96 05/28/2019   CALCIUM 8.9 05/28/2019   PROT 6.8 05/28/2019   ALBUMIN 3.6 05/28/2019   AST 24 05/28/2019   ALT 11 05/28/2019   ALKPHOS 58 05/28/2019   BILITOT 0.6 05/28/2019   GFRNONAA 56 (L) 05/28/2019   GFRAA >60 05/28/2019    Lab Results  Component Value Date   CEA1 3.06 05/28/2019    Lab Results  Component Value Date   INR 1.1 01/29/2016    Imaging:  Ct Chest Wo Contrast  Result Date: 05/27/2019 CLINICAL DATA:  Metastatic colon cancer to liver and lungs. Asymptomatic. Currently on oral chemotherapy. EXAM: CT CHEST WITHOUT CONTRAST TECHNIQUE: Multidetector CT imaging of the chest was performed following the standard protocol without IV contrast. COMPARISON:  01/24/2019 FINDINGS: Cardiovascular: Aortic atherosclerosis. Tortuous thoracic aorta. Normal heart size, without pericardial effusion. Multivessel coronary artery  atherosclerosis. Mediastinum/Nodes: No supraclavicular adenopathy. No mediastinal or definite hilar adenopathy, given limitations of unenhanced CT. Tiny hiatal hernia. Lungs/Pleura: No pleural fluid. Bilateral pulmonary nodules are again identified. Posterior right upper lobe 7 mm nodule on 44/5 is increased from 4 mm on the prior exam (when remeasured). An anterior right upper lobe 4 mm nodule on 54/5 is increased from 3 mm on the prior exam (when remeasured). Lingular nodule of 8 mm on 86/5 is enlarged from 5 mm on the prior. Perifissural right middle lobe nodule of 1.9 x 1.4 cm on 93/5 is enlarged from 1.1 x 1.1 cm on the prior exam (when remeasured). Upper Abdomen: Left hepatic lobe cysts. A partially exophytic right hepatic lobe 2.1 cm lesion on 154/2 is similar. Normal imaged portions of the spleen, stomach, pancreas, adrenal glands, kidneys. Musculoskeletal: No acute osseous abnormality. Healing fractures of the right ninth and tenth posterolateral ribs are unchanged. IMPRESSION: 1. Progression of pulmonary metastasis as detailed above. 2. No thoracic adenopathy. 3. Grossly similar right hepatic lobe lesion compared to 01/24/2019, suboptimally evaluated on this noncontrast exam. 4. Coronary artery atherosclerosis. Aortic Atherosclerosis (ICD10-I70.0). Electronically Signed   By: Abigail Miyamoto M.D.   On: 05/27/2019 11:22    Medications: I have reviewed the patient's current medications.   Assessment/Plan: 1. Moderately differentiated adenocarcinoma of the cecum, stage III (T4a, N1), status post a laparoscopic assisted right colectomy 10/22/2013  The tumor returned microsatellite stable with  normal mismatch repair protein expression   Cycle 1 adjuvant Xeloda 12/25/2013.   Cycle 2 adjuvant Xeloda 01/15/2014.   Cycle 3 adjuvant Xeloda 02/05/2014.   Cycle 4 adjuvant Xeloda 03/18/2014, discontinued after 4 days secondary to diarrhea   Cycle 5 adjuvant Xeloda 04/16/2014, Xeloda dose reduced to  1000 mg in the a.m. and 500 mg in the p.m.   Cycle 6 adjuvant Xeloda 05/07/2014.  Cycle 7 adjuvant Xeloda 05/28/2014.  Cycle 8 adjuvant Xeloda 06/18/2014.  Restaging CTs on 07/14/2014 with no evidence of metastatic colon cancer  Colonoscopy 10/02/2014 with diverticulosis in the sigmoid colon and in the descending colon. One 4 mm polyp in the sigmoid colon with complete resection. Polyp tissue not retrieved. Internal hemorrhoids. Next colonoscopy in 3 years for surveillance.  Restaging CTs 07/16/2015 with 2 new hypodense liver lesions concerning for metastases, no other evidence of disease progression  PET scan 07/28/2015 with a single hypermetabolic right liver lesion, no other evidence of metastatic disease  Ultrasound-guided biopsy of the right liver lesion 07/30/2015 confirmed metastatic colon cancer  MRI abdomen 08/20/2015 with similar to slight decrease in size of 2 hepatic metastasis. No new liver lesions or extrahepatic metastatic disease identified.  Radiofrequency ablation of 2 liver lesions 09/25/2015  MRI abdomen 02/02/2016-coagulative necrosis at the 2 ablated liver tumor sites,with no enhancement to suggest residual or recurrent liver tumor. No new liver masses or other findings of metastatic disease in the abdomen.  MRI abdomen 08/10/2016 revealed stable ablation sites, no evidence of progressive metastatic disease  Restaging CT 02/08/2017-stable treated liver lesions, no evidence of progressive metastatic disease, stable borderline enlarged right inguinal node  CEA with stable mild elevation 02/08/2017, 03/01/2017, 03/29/2017,04/19/2017  CEA further elevated 08/21/2017  CTs 09/05/2017-possible local recurrence of tumor along the ablation site right hepatic lobe lesion. Cephalad to the ablation site approximately 1.9 x 2.1cmsoft tissue mass progressive compared to the prior exam.  Liver SBRT 10/17/2017, 10/19/2017, 10/23/2017, 10/25/2017, 10/27/2017  CT  02/19/2018-progressive ill-defined areas of low attenuation with enhancement surrounding the segment 6 ablation site, stable segment 8 ablation site enlargement of extracapsular lesion posteriorly, no new hepatic lesions, wall thickening/edema at the distal ileum  CT 05/22/2018-2 new lung nodules suspicious for metastases, stable segment 8 ablation zone, slight decrease in the size of delayed perfusion at the segment 6 ablation zone, posterior extracapsular lesion is slightly smaller  CT chest 07/13/2018-multiple lung nodules consistent with metastatic disease  Cycle 1 Xeloda 07/23/2018  Cycle 2 Xeloda 08/13/2018  Cycle 3 Xeloda 09/03/2018  Cycle 4 Xeloda 09/24/2018  Restaging chest CT 10/12/2018-scattered pulmonary nodules/metastases mildly improved.  Cycle 5 Xeloda 10/15/2018  Cycle 6 Xeloda 11/05/2018  Cycle 7 Xeloda 11/26/2018  Cycle 8 Xeloda 12/17/2018  Cycle 9 Xeloda 01/07/2019  CT chest 01/24/2019-stable pulmonary nodules. No new pulmonary nodules. Stable left hepatic lobe cysts and right hepatic lobe lesions. No new hepatic lesions. Stable right ninth and 10th rib lesions.  Xeloda daily dosing schedule beginning 01/28/2019 due to hand-foot syndrome  CT 05/27/2019- enlargement of lung metastases, similar right hepatic lobe lesion 2. Right ovary cystadenofibroma, status post a bilateral oophorectomy 10/22/2013 3. Remote history of cervical cancer. 4. Iron deficiency anemia-resolved. 5. Right calf deep vein thrombosis 10/24/2013-she completed 3 months of xarelto. 6. Indeterminate 12 mm hypermetabolic right pelvic density on the staging PET scan 10/04/2013-CT followup recommended per the GI tumor conference 11/13/2013. No longer visualized on a CT 07/14/2014. 7. Soft tissue implant overlying the posterior right liver on the CT 70/96/2836-OQH hypermetabolic on the  PET scan 10/04/2013. 8. C. difficile colitis 03/05/2014. She completed a course of Flagyl, recurrent diarrhea beginning  03/21/2014, positive C. difficile toxin 03/26/2014-resumed Flagyl for planned 10 day course.  Vancomycin beginning 04/08/2014.  Taper complete 05/21/2014.  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 Xeloda dose/schedule adjusted beginning 01/28/2019.     Disposition:  Cynthia [name] appears unchanged.  The restaging CT reveals enlargement of I reviewed the CT images with her.  We discussed treatment options including a treatment break, continuation of Xeloda, and changing to a different systemic therapy.  She is not sure whether she would agree to other chemotherapy drugs such as oxalic platinum and irinotecan.  She agrees to submit the liver or colon specimen for molecular testing to see if she is a candidate for targeted therapy. She will return for an office visit and further discussion in 2 weeks.  [name], MD  05/28/2019  1:39 PM

## 2019-05-28 NOTE — Telephone Encounter (Signed)
Scheduled appt per 10/13 los - gave patient AVS and calender per los.

## 2019-05-28 NOTE — Progress Notes (Signed)
Emailed request to [facility] per Dr. Benay Spice request: Case [phone] Colon Stage IV Code: C18.9

## 2019-06-12 ENCOUNTER — Encounter (HOSPITAL_COMMUNITY): Payer: Self-pay | Admitting: Oncology

## 2019-06-14 ENCOUNTER — Inpatient Hospital Stay: Payer: Medicare Other | Admitting: Oncology

## 2019-06-14 ENCOUNTER — Telehealth: Payer: Self-pay | Admitting: Oncology

## 2019-06-14 ENCOUNTER — Other Ambulatory Visit: Payer: Self-pay

## 2019-06-14 VITALS — BP 171/83 | HR 66 | Temp 98.5°F | Resp 17 | Ht 61.0 in | Wt 140.8 lb

## 2019-06-14 DIAGNOSIS — C787 Secondary malignant neoplasm of liver and intrahepatic bile duct: Secondary | ICD-10-CM | POA: Diagnosis not present

## 2019-06-14 DIAGNOSIS — C189 Malignant neoplasm of colon, unspecified: Secondary | ICD-10-CM

## 2019-06-14 DIAGNOSIS — C180 Malignant neoplasm of cecum: Secondary | ICD-10-CM | POA: Diagnosis not present

## 2019-06-14 NOTE — Telephone Encounter (Signed)
Schedule per los. Gave avs and calendar °

## 2019-06-14 NOTE — Progress Notes (Signed)
[place] OFFICE PROGRESS NOTE   Diagnosis: Colon cancer  INTERVAL HISTORY:   Cynthia [name] returns as scheduled.  She feels well.  The area of induration of the right lateral chest wall is more noticeable, but there is no pain and she does not feel the area has enlarged.  No complaints.  Objective:  Vital signs in last 24 hours:  Blood pressure (!) 171/83, pulse 66, temperature 98.5 F (36.9 C), temperature source Temporal, resp. rate 17, height _0  (1.549 m), weight 140 lb 12.8 oz (63.9 kg), SpO2 98 %.    Musculoskeletal: Area of induration at the right lateral chest wall with skin thickening firm masslike component superiorly    Lab Results:  Lab Results  Component Value Date   WBC 4.8 05/28/2019   HGB 12.1 05/28/2019   HCT 36.5 05/28/2019   MCV 107.7 (H) 05/28/2019   PLT 204 05/28/2019   NEUTROABS 2.9 05/28/2019    CMP  Lab Results  Component Value Date   NA 142 05/28/2019   K 4.3 05/28/2019   CL 107 05/28/2019   CO2 28 05/28/2019   GLUCOSE 81 05/28/2019   BUN 14 05/28/2019   CREATININE 0.96 05/28/2019   CALCIUM 8.9 05/28/2019   PROT 6.8 05/28/2019   ALBUMIN 3.6 05/28/2019   AST 24 05/28/2019   ALT 11 05/28/2019   ALKPHOS 58 05/28/2019   BILITOT 0.6 05/28/2019   GFRNONAA 56 (L) 05/28/2019   GFRAA >60 05/28/2019    Lab Results  Component Value Date   CEA1 3.06 05/28/2019    Medications: I have reviewed the patient's current medications.   Assessment/Plan:  1. Moderately differentiated adenocarcinoma of the cecum, stage III (T4a, N1), status post a laparoscopic assisted right colectomy 10/22/2013  The tumor returned microsatellite stable with normal mismatch repair protein expression   Cycle 1 adjuvant Xeloda 12/25/2013.   Cycle 2 adjuvant Xeloda 01/15/2014.   Cycle 3 adjuvant Xeloda 02/05/2014.   Cycle 4 adjuvant Xeloda 03/18/2014, discontinued after 4 days secondary to diarrhea   Cycle 5 adjuvant Xeloda  04/16/2014, Xeloda dose reduced to 1000 mg in the a.m. and 500 mg in the p.m.   Cycle 6 adjuvant Xeloda 05/07/2014.  Cycle 7 adjuvant Xeloda 05/28/2014.  Cycle 8 adjuvant Xeloda 06/18/2014.  Restaging CTs on 07/14/2014 with no evidence of metastatic colon cancer  Colonoscopy 10/02/2014 with diverticulosis in the sigmoid colon and in the descending colon. One 4 mm polyp in the sigmoid colon with complete resection. Polyp tissue not retrieved. Internal hemorrhoids. Next colonoscopy in 3 years for surveillance.  Restaging CTs 07/16/2015 with 2 new hypodense liver lesions concerning for metastases, no other evidence of disease progression  PET scan 07/28/2015 with a single hypermetabolic right liver lesion, no other evidence of metastatic disease  Ultrasound-guided biopsy of the right liver lesion 07/30/2015 confirmed metastatic colon cancer, MSS, tumor mutation burden 1, KRAS G12D, PIK3CA, PTEN  MRI abdomen 08/20/2015 with similar to slight decrease in size of 2 hepatic metastasis. No new liver lesions or extrahepatic metastatic disease identified.  Radiofrequency ablation of 2 liver lesions 09/25/2015  MRI abdomen 02/02/2016-coagulative necrosis at the 2 ablated liver tumor sites,with no enhancement to suggest residual or recurrent liver tumor. No new liver masses or other findings of metastatic disease in the abdomen.  MRI abdomen 08/10/2016 revealed stable ablation sites, no evidence of progressive metastatic disease  Restaging CT 02/08/2017-stable treated liver lesions, no evidence of progressive metastatic disease, stable borderline enlarged right inguinal node  CEA  with stable mild elevation 02/08/2017, 03/01/2017, 03/29/2017,04/19/2017  CEA further elevated 08/21/2017  CTs 09/05/2017-possible local recurrence of tumor along the ablation site right hepatic lobe lesion. Cephalad to the ablation site approximately 1.9 x 2.1cmsoft tissue mass progressive compared to the prior exam.   Liver SBRT 10/17/2017, 10/19/2017, 10/23/2017, 10/25/2017, 10/27/2017  CT 02/19/2018-progressive ill-defined areas of low attenuation with enhancement surrounding the segment 6 ablation site, stable segment 8 ablation site enlargement of extracapsular lesion posteriorly, no new hepatic lesions, wall thickening/edema at the distal ileum  CT 05/22/2018-2 new lung nodules suspicious for metastases, stable segment 8 ablation zone, slight decrease in the size of delayed perfusion at the segment 6 ablation zone, posterior extracapsular lesion is slightly smaller  CT chest 07/13/2018-multiple lung nodules consistent with metastatic disease  Cycle 1 Xeloda 07/23/2018  Cycle 2 Xeloda 08/13/2018  Cycle 3 Xeloda 09/03/2018  Cycle 4 Xeloda 09/24/2018  Restaging chest CT 10/12/2018-scattered pulmonary nodules/metastases mildly improved.  Cycle 5 Xeloda 10/15/2018  Cycle 6 Xeloda 11/05/2018  Cycle 7 Xeloda 11/26/2018  Cycle 8 Xeloda 12/17/2018  Cycle 9 Xeloda 01/07/2019  CT chest 01/24/2019-stable pulmonary nodules. No new pulmonary nodules. Stable left hepatic lobe cysts and right hepatic lobe lesions. No new hepatic lesions. Stable right ninth and 10th rib lesions.  Xeloda daily dosing schedule beginning 01/28/2019 due to hand-foot syndrome  CT 05/27/2019- enlargement of lung metastases, similar right hepatic lobe lesion 2. Right ovary cystadenofibroma, status post a bilateral oophorectomy 10/22/2013 3. Remote history of cervical cancer. 4. Iron deficiency anemia-resolved. 5. Right calf deep vein thrombosis 10/24/2013-she completed 3 months of xarelto. 6. Indeterminate 12 mm hypermetabolic right pelvic density on the staging PET scan 10/04/2013-CT followup recommended per the GI tumor conference 11/13/2013. No longer visualized on a CT 07/14/2014. 7. Soft tissue implant overlying the posterior right liver on the CT 80/16/5537-SMO hypermetabolic on the PET scan 70/78/6754. 8. C. difficile colitis  03/05/2014. She completed a course of Flagyl, recurrent diarrhea beginning 03/21/2014, positive C. difficile toxin 03/26/2014-resumed Flagyl for planned 10 day course.  Vancomycin beginning 04/08/2014.  Taper complete 05/21/2014.  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 Xeloda dose/schedule adjusted beginning 01/28/2019.   Disposition: Ms. Milan appears well.  We reviewed the Foundation 1 results and treatment options.  She is not appear to be a candidate for immunotherapy, a targeted therapy, or EGFR directed therapy.  We discussed FOLFOX and FOLFIRI.  She does not wish to consider oxaliplatin due to her pre-existing neuropathy.  We discussed the FOLFIRI regimen and potential toxicities.  We also discussed the need for a Port-A-Cath.  She would like to wait on beginning treatment until after the holidays.  She will return for further discussion in early December.  We will refer her for a baseline CT Port-A-Cath placement after the December visit.  She will bring her daughter in December.  Cynthia Coder, MD  06/14/2019  11:55 AM

## 2019-06-17 ENCOUNTER — Encounter (HOSPITAL_COMMUNITY): Payer: Self-pay | Admitting: Oncology

## 2019-07-22 ENCOUNTER — Inpatient Hospital Stay: Payer: Medicare Other | Attending: Oncology | Admitting: Oncology

## 2019-07-22 ENCOUNTER — Inpatient Hospital Stay: Payer: Medicare Other

## 2019-07-22 ENCOUNTER — Other Ambulatory Visit: Payer: Self-pay

## 2019-07-22 VITALS — BP 144/84 | HR 63 | Temp 97.8°F | Resp 17 | Ht 61.0 in | Wt 145.9 lb

## 2019-07-22 DIAGNOSIS — C180 Malignant neoplasm of cecum: Secondary | ICD-10-CM | POA: Diagnosis present

## 2019-07-22 DIAGNOSIS — Z5111 Encounter for antineoplastic chemotherapy: Secondary | ICD-10-CM | POA: Insufficient documentation

## 2019-07-22 DIAGNOSIS — C787 Secondary malignant neoplasm of liver and intrahepatic bile duct: Secondary | ICD-10-CM | POA: Insufficient documentation

## 2019-07-22 DIAGNOSIS — C189 Malignant neoplasm of colon, unspecified: Secondary | ICD-10-CM

## 2019-07-22 DIAGNOSIS — Z7189 Other specified counseling: Secondary | ICD-10-CM | POA: Diagnosis not present

## 2019-07-22 DIAGNOSIS — Z86718 Personal history of other venous thrombosis and embolism: Secondary | ICD-10-CM | POA: Insufficient documentation

## 2019-07-22 DIAGNOSIS — Z8541 Personal history of malignant neoplasm of cervix uteri: Secondary | ICD-10-CM | POA: Insufficient documentation

## 2019-07-22 DIAGNOSIS — C7800 Secondary malignant neoplasm of unspecified lung: Secondary | ICD-10-CM | POA: Diagnosis not present

## 2019-07-22 NOTE — Progress Notes (Signed)
[place] OFFICE PROGRESS NOTE   Diagnosis: Colon cancer  INTERVAL HISTORY:   [name] returns for a scheduled visit.  She has intermittent discomfort at the right chest wall when she is physically active.  No consistent pain.  No dyspnea.  No new complaint.  Stable neuropathy symptoms in the legs and feet.  Good appetite.  Objective:  Vital signs in last 24 hours:  Blood pressure (!) 144/84, pulse 63, temperature 97.8 F (36.6 C), temperature source Temporal, resp. rate 17, height 5' 1"  (1.549 m), weight 145 lb 14.4 oz (66.2 kg), SpO2 99 %.    Limited physical examination secondary to distancing with the Covid pandemic GI: No hepatosplenomegaly Vascular: The right lower leg is slightly larger than the left side, no edema Musculoskeletal: Skin thickening/subcutaneous fullness at the right lateral chest wall inferior to the bra line, no discrete mass, nontender    Lab Results:  Lab Results  Component Value Date   WBC 4.8 05/28/2019   HGB 12.1 05/28/2019   HCT 36.5 05/28/2019   MCV 107.7 (H) 05/28/2019   PLT 204 05/28/2019   NEUTROABS 2.9 05/28/2019    CMP  Lab Results  Component Value Date   NA 142 05/28/2019   K 4.3 05/28/2019   CL 107 05/28/2019   CO2 28 05/28/2019   GLUCOSE 81 05/28/2019   BUN 14 05/28/2019   CREATININE 0.96 05/28/2019   CALCIUM 8.9 05/28/2019   PROT 6.8 05/28/2019   ALBUMIN 3.6 05/28/2019   AST 24 05/28/2019   ALT 11 05/28/2019   ALKPHOS 58 05/28/2019   BILITOT 0.6 05/28/2019   GFRNONAA 56 (L) 05/28/2019   GFRAA >60 05/28/2019    Lab Results  Component Value Date   CEA1 3.06 05/28/2019    Medications: I have reviewed the patient's current medications.   Assessment/Plan: 1. Moderately differentiated adenocarcinoma of the cecum, stage III (T4a, N1), status post a laparoscopic assisted right colectomy 10/22/2013  The tumor returned microsatellite stable with normal mismatch repair protein expression   Cycle  1 adjuvant Xeloda 12/25/2013.   Cycle 2 adjuvant Xeloda 01/15/2014.   Cycle 3 adjuvant Xeloda 02/05/2014.   Cycle 4 adjuvant Xeloda 03/18/2014, discontinued after 4 days secondary to diarrhea   Cycle 5 adjuvant Xeloda 04/16/2014, Xeloda dose reduced to 1000 mg in the a.m. and 500 mg in the p.m.   Cycle 6 adjuvant Xeloda 05/07/2014.  Cycle 7 adjuvant Xeloda 05/28/2014.  Cycle 8 adjuvant Xeloda 06/18/2014.  Restaging CTs on 07/14/2014 with no evidence of metastatic colon cancer  Colonoscopy 10/02/2014 with diverticulosis in the sigmoid colon and in the descending colon. One 4 mm polyp in the sigmoid colon with complete resection. Polyp tissue not retrieved. Internal hemorrhoids. Next colonoscopy in 3 years for surveillance.  Restaging CTs 07/16/2015 with 2 new hypodense liver lesions concerning for metastases, no other evidence of disease progression  PET scan 07/28/2015 with a single hypermetabolic right liver lesion, no other evidence of metastatic disease  Ultrasound-guided biopsy of the right liver lesion 07/30/2015 confirmed metastatic colon cancer, MSS, tumor mutation burden 1, KRAS G12D, PIK3CA, PTEN  MRI abdomen 08/20/2015 with similar to slight decrease in size of 2 hepatic metastasis. No new liver lesions or extrahepatic metastatic disease identified.  Radiofrequency ablation of 2 liver lesions 09/25/2015  MRI abdomen 02/02/2016-coagulative necrosis at the 2 ablated liver tumor sites,with no enhancement to suggest residual or recurrent liver tumor. No new liver masses or other findings of metastatic disease in the abdomen.  MRI abdomen  08/10/2016 revealed stable ablation sites, no evidence of progressive metastatic disease  Restaging CT 02/08/2017-stable treated liver lesions, no evidence of progressive metastatic disease, stable borderline enlarged right inguinal node  CEA with stable mild elevation 02/08/2017, 03/01/2017, 03/29/2017,04/19/2017  CEA further  elevated 08/21/2017  CTs 09/05/2017-possible local recurrence of tumor along the ablation site right hepatic lobe lesion. Cephalad to the ablation site approximately 1.9 x 2.1cmsoft tissue mass progressive compared to the prior exam.  Liver SBRT 10/17/2017, 10/19/2017, 10/23/2017, 10/25/2017, 10/27/2017  CT 02/19/2018-progressive ill-defined areas of low attenuation with enhancement surrounding the segment 6 ablation site, stable segment 8 ablation site enlargement of extracapsular lesion posteriorly, no new hepatic lesions, wall thickening/edema at the distal ileum  CT 05/22/2018-2 new lung nodules suspicious for metastases, stable segment 8 ablation zone, slight decrease in the size of delayed perfusion at the segment 6 ablation zone, posterior extracapsular lesion is slightly smaller  CT chest 07/13/2018-multiple lung nodules consistent with metastatic disease  Cycle 1 Xeloda 07/23/2018  Cycle 2 Xeloda 08/13/2018  Cycle 3 Xeloda 09/03/2018  Cycle 4 Xeloda 09/24/2018  Restaging chest CT 10/12/2018-scattered pulmonary nodules/metastases mildly improved.  Cycle 5 Xeloda 10/15/2018  Cycle 6 Xeloda 11/05/2018  Cycle 7 Xeloda 11/26/2018  Cycle 8 Xeloda 12/17/2018  Cycle 9 Xeloda 01/07/2019  CT chest 01/24/2019-stable pulmonary nodules. No new pulmonary nodules. Stable left hepatic lobe cysts and right hepatic lobe lesions. No new hepatic lesions. Stable right ninth and 10th rib lesions.  Xeloda daily dosing schedule beginning 01/28/2019 due to hand-foot syndrome  CT 05/27/2019- enlargement of lung metastases, similar right hepatic lobe lesion 2. Right ovary cystadenofibroma, status post a bilateral oophorectomy 10/22/2013 3. Remote history of cervical cancer. 4. Iron deficiency anemia-resolved. 5. Right calf deep vein thrombosis 10/24/2013-she completed 3 months of xarelto. 6. Indeterminate 12 mm hypermetabolic right pelvic density on the staging PET scan 10/04/2013-CT followup recommended  per the GI tumor conference 11/13/2013. No longer visualized on a CT 07/14/2014. 7. Soft tissue implant overlying the posterior right liver on the CT 34/91/7915-AVW hypermetabolic on the PET scan 97/94/8016. 8. C. difficile colitis 03/05/2014. She completed a course of Flagyl, recurrent diarrhea beginning 03/21/2014, positive C. difficile toxin 03/26/2014-resumed Flagyl for planned 10 day course.  Vancomycin beginning 04/08/2014.  Taper complete 05/21/2014.  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 Xeloda dose/schedule adjusted beginning 01/28/2019.     Disposition: Ms. Seevers is unchanged.  She agrees to proceed with FOLFIRI chemotherapy.  We reviewed potential toxicities associated with the FOLFIRI regimen including the chance for nausea/vomiting, mucositis, diarrhea, alopecia, and hematologic toxicity.  She will attend a chemotherapy teaching class today.  Ms. Eklund will be referred for placement of a Port-A-Cath.  She will be scheduled for a baseline chest CT.  She will be scheduled for an office visit and cycle 1 FOLFIRI on 08/13/2019.  25 minutes were spent today.  The majority of the time was used for counseling and coordination of care.  [name], MD  07/22/2019  9:38 AM

## 2019-07-22 NOTE — Progress Notes (Signed)
START ON PATHWAY REGIMEN - Colorectal     A cycle is every 14 days:     Irinotecan      Leucovorin      Fluorouracil      Fluorouracil   **Always confirm dose/schedule in your pharmacy ordering system**  Patient Characteristics: Distant Metastases, Nonsurgical Candidate, KRAS/NRAS Mutation Positive/Unknown (BRAF V600 Wild-Type/Unknown), Standard Cytotoxic Therapy, First Line Standard Cytotoxic Therapy, Bevacizumab Ineligible, PS = 0,1 Tumor Location: Colon Therapeutic Status: Distant Metastases Microsatellite/Mismatch Repair Status: MSS/pMMR BRAF Mutation Status: Wild-Type (no mutation) KRAS/NRAS Mutation Status: Mutation Positive Standard Cytotoxic Line of Therapy: First Line Standard Cytotoxic Therapy ECOG Performance Status: 1 Bevacizumab Eligibility: Ineligible Intent of Therapy: Non-Curative / Palliative Intent, Discussed with Patient

## 2019-07-23 ENCOUNTER — Other Ambulatory Visit: Payer: Self-pay | Admitting: *Deleted

## 2019-07-23 MED ORDER — LIDOCAINE-PRILOCAINE 2.5-2.5 % EX CREA: 1.0000 "application " | TOPICAL_CREAM | CUTANEOUS | 2 refills | Status: DC

## 2019-07-25 ENCOUNTER — Other Ambulatory Visit: Payer: Self-pay | Admitting: Radiology

## 2019-07-29 ENCOUNTER — Ambulatory Visit (HOSPITAL_COMMUNITY)
Admission: RE | Admit: 2019-07-29 | Discharge: 2019-07-29 | Disposition: A | Discharge status: Home / Self Care | Payer: Medicare Other | Source: Ambulatory Visit | Attending: Oncology | Admitting: Oncology

## 2019-07-29 ENCOUNTER — Other Ambulatory Visit: Payer: Self-pay

## 2019-07-29 ENCOUNTER — Other Ambulatory Visit: Payer: Self-pay | Admitting: Oncology

## 2019-07-29 DIAGNOSIS — I10 Essential (primary) hypertension: Secondary | ICD-10-CM | POA: Insufficient documentation

## 2019-07-29 DIAGNOSIS — Z86718 Personal history of other venous thrombosis and embolism: Secondary | ICD-10-CM | POA: Insufficient documentation

## 2019-07-29 DIAGNOSIS — C19 Malignant neoplasm of rectosigmoid junction: Secondary | ICD-10-CM | POA: Diagnosis present

## 2019-07-29 DIAGNOSIS — C787 Secondary malignant neoplasm of liver and intrahepatic bile duct: Secondary | ICD-10-CM

## 2019-07-29 DIAGNOSIS — Z79899 Other long term (current) drug therapy: Secondary | ICD-10-CM | POA: Insufficient documentation

## 2019-07-29 DIAGNOSIS — Z8673 Personal history of transient ischemic attack (TIA), and cerebral infarction without residual deficits: Secondary | ICD-10-CM | POA: Insufficient documentation

## 2019-07-29 HISTORY — PX: IR IMAGING GUIDED PORT INSERTION: IMG5740

## 2019-07-29 LAB — CBC WITH DIFFERENTIAL/PLATELET
Abs Immature Granulocytes: 0.01 10*3/uL (ref 0.00–0.07)
Basophils Absolute: 0 10*3/uL (ref 0.0–0.1)
Basophils Relative: 1 %
Eosinophils Absolute: 0.2 10*3/uL (ref 0.0–0.5)
Eosinophils Relative: 3 %
HCT: 40.6 % (ref 36.0–46.0)
Hemoglobin: 12.7 g/dL (ref 12.0–15.0)
Immature Granulocytes: 0 %
Lymphocytes Relative: 32 %
Lymphs Abs: 1.8 10*3/uL (ref 0.7–4.0)
MCH: 31.9 pg (ref 26.0–34.0)
MCHC: 31.3 g/dL (ref 30.0–36.0)
MCV: 102 fL — ABNORMAL HIGH (ref 80.0–100.0)
Monocytes Absolute: 0.4 10*3/uL (ref 0.1–1.0)
Monocytes Relative: 8 %
Neutro Abs: 3.3 10*3/uL (ref 1.7–7.7)
Neutrophils Relative %: 56 %
Platelets: 231 10*3/uL (ref 150–400)
RBC: 3.98 MIL/uL (ref 3.87–5.11)
RDW: 13.5 % (ref 11.5–15.5)
WBC: 5.8 10*3/uL (ref 4.0–10.5)
nRBC: 0 % (ref 0.0–0.2)

## 2019-07-29 LAB — BASIC METABOLIC PANEL
Anion gap: 13 (ref 5–15)
BUN: 15 mg/dL (ref 8–23)
CO2: 23 mmol/L (ref 22–32)
Calcium: 9.3 mg/dL (ref 8.9–10.3)
Chloride: 105 mmol/L (ref 98–111)
Creatinine, Ser: 0.97 mg/dL (ref 0.44–1.00)
GFR calc Af Amer: >60 mL/min (ref >60)
GFR calc non Af Amer: 55 mL/min — ABNORMAL LOW (ref >60)
Glucose, Bld: 92 mg/dL (ref 70–99)
Potassium: 4.1 mmol/L (ref 3.5–5.1)
Sodium: 141 mmol/L (ref 135–145)

## 2019-07-29 LAB — PROTIME-INR
INR: 0.9 (ref 0.8–1.2)
Prothrombin Time: 12.5 seconds (ref 11.4–15.2)

## 2019-07-29 MED ORDER — SODIUM CHLORIDE 0.9 % IV SOLN
INTRAVENOUS | Status: DC
  Administered 2019-07-29: 14:00:00 via INTRAVENOUS

## 2019-07-29 MED ORDER — MIDAZOLAM HCL 2 MG/2ML IJ SOLN
INTRAMUSCULAR | Status: AC | PRN
  Administered 2019-07-29 (×2): 0.5 mg via INTRAVENOUS
  Administered 2019-07-29: 1 mg via INTRAVENOUS

## 2019-07-29 MED ORDER — LIDOCAINE-EPINEPHRINE 1 %-1:100000 IJ SOLN
INTRAMUSCULAR | Status: AC
  Filled 2019-07-29: qty 1

## 2019-07-29 MED ORDER — HEPARIN SOD (PORK) LOCK FLUSH 100 UNIT/ML IV SOLN
INTRAVENOUS | Status: AC
  Filled 2019-07-29: qty 5

## 2019-07-29 MED ORDER — FENTANYL CITRATE (PF) 100 MCG/2ML IJ SOLN
INTRAMUSCULAR | Status: AC
  Filled 2019-07-29: qty 2

## 2019-07-29 MED ORDER — FENTANYL CITRATE (PF) 100 MCG/2ML IJ SOLN
INTRAMUSCULAR | Status: AC | PRN
  Administered 2019-07-29: 25 ug via INTRAVENOUS
  Administered 2019-07-29: 50 ug via INTRAVENOUS
  Administered 2019-07-29: 25 ug via INTRAVENOUS

## 2019-07-29 MED ORDER — CEFAZOLIN SODIUM-DEXTROSE 2-4 GM/100ML-% IV SOLN
INTRAVENOUS | Status: AC
  Administered 2019-07-29: 15:00:00 2 g via INTRAVENOUS
  Filled 2019-07-29: qty 100

## 2019-07-29 MED ORDER — CEFAZOLIN SODIUM-DEXTROSE 2-4 GM/100ML-% IV SOLN: 2.0000 g | INTRAVENOUS | Status: AC

## 2019-07-29 MED ORDER — MIDAZOLAM HCL 2 MG/2ML IJ SOLN
INTRAMUSCULAR | Status: AC
  Filled 2019-07-29: qty 4

## 2019-07-29 MED ORDER — LIDOCAINE-EPINEPHRINE 1 %-1:100000 IJ SOLN
INTRAMUSCULAR | Status: AC | PRN
  Administered 2019-07-29: 10 mL
  Administered 2019-07-29: 5 mL

## 2019-07-29 MED ORDER — HEPARIN SOD (PORK) LOCK FLUSH 100 UNIT/ML IV SOLN
INTRAVENOUS | Status: AC | PRN
  Administered 2019-07-29: 500 [IU] via INTRAVENOUS

## 2019-07-29 NOTE — H&P (Signed)
Chief Complaint: Patient was seen in consultation today for port placement at the request of Sherrill,[name]  Referring Physician(s): Ladell Pier  Supervising Physician: Jacqulynn Cadet  Patient Status: [facility] - Out-pt  History of Present Illness: Cynthia [name] is a 83 y.o. female with colorectal cancer set to start chemotherapy soon. She is referred for port placement. PMHx, meds, labs, imaging, allergies reviewed. Feels well, no recent fevers, chills, illness. Has been NPO today as directed.    Past Medical History:  Diagnosis Date  . Anemia   . Blood transfusion without reported diagnosis jan 2015  . C. difficile colitis 03/05/14, 03/22/14, 04/05/14   recurrent  . Cancer ([place])    liver  . Cervical cancer ([place]) 1972  . Colon cancer ([place]) JAN 2015  . Colon cancer ([place])   . Complication of anesthesia    slow to awaken in past, DONE WELL RECENTLY  . DVT (deep venous thrombosis) ([place]) 10/24/13   Right leg  . Guillain-Barre ([place]) 09/2015  . Heart murmur   . Hypertension    no bp meds   . Liver cyst   . Stroke ([place]) 12/2014   NUMBNESS ON LEFT SIDE  . Tinnitus of both ears ALL THE TIME    Past Surgical History:  Procedure Laterality Date  . ABDOMINAL HYSTERECTOMY     vaginal- partial  . BACK SURGERY  1981   lower lumbar  . ESOPHAGOGASTRODUODENOSCOPY N/A 10/14/2015   Procedure: ESOPHAGOGASTRODUODENOSCOPY (EGD);  Surgeon: Clarene Essex, MD;  Location: [facility] ENDOSCOPY;  Service: Endoscopy;  Laterality: N/A;  Bedside in ICU  . IR GENERIC HISTORICAL  09/07/2016   IR RADIOLOGIST EVAL & MGMT 09/07/2016 Aletta Edouard, MD [facility] INTERV RAD  . LAPAROSCOPIC RIGHT COLECTOMY Right 10/22/2013   Procedure: LAPAROSCOPIC RIGHT COLECTOMY ;  Surgeon: [name], MD;  Location: [facility] ORS;  Service: General;  Laterality: Right;  . SALPINGOOPHORECTOMY  10/22/2013   Procedure: BILATERALSALPINGO OOPHORECTOMY;  Surgeon: Imagene Gurney A. [name], MD;  Location: [facility] ORS;  Service: Gynecology;;  .  TUBAL LIGATION  1968    Allergies: Morphine and related  Medications: Prior to Admission medications   Medication Sig Start Date End Date Taking? Authorizing Provider  gabapentin (NEURONTIN) 400 MG capsule TAKE 1 CAPSULE BY MOUTH THREE TIMES DAILY. MAY TAKE ADDITIONAL DOSE AT NIGHT AS NEEDED 08/09/18  Yes Meredith Staggers, MD  metoprolol succinate (TOPROL-XL) 50 MG 24 hr tablet Take 50 mg by mouth daily. Take with or immediately following a meal.   Yes [provider]  acetaminophen (TYLENOL) 500 MG tablet Take 1,000 mg by mouth every 6 (six) hours as needed for mild pain.     [provider]  atorvastatin (LIPITOR) 40 MG tablet Take 1 tablet (40 mg total) by mouth daily. Patient not taking: Reported on 07/22/2019 01/13/15   Janora Norlander, DO  lidocaine-prilocaine (EMLA) cream Apply 1 application topically as directed. Apply to port site 1 hour prior to stick and cover with plastic wrap 07/23/19   Ladell Pier, MD  loperamide (IMODIUM) 2 MG capsule Take 2-4 mg by mouth as needed for diarrhea or loose stools. 08/09/18   Ladell Pier, MD  prochlorperazine (COMPAZINE) 5 MG tablet Take 1 tablet (5 mg total) by mouth every 6 (six) hours as needed for nausea or vomiting. 07/18/18   Ladell Pier, MD     Family History  Problem Relation Age of Onset  . Diabetes Sister   . Cancer Sister  leukemia  . Diabetes Brother   . Heart failure Brother   . Hypertension Brother   . Rheum arthritis Mother   . Heart failure Father   . Breast cancer Maternal Aunt   . Cancer Maternal Grandmother        GIST  . Heart failure Maternal Grandfather   . Aneurysm Paternal Grandmother     Social History   Socioeconomic History  . Marital status: Widowed    Spouse name: Not on file  . Number of children: Not on file  . Years of education: Not on file  . Highest education level: Not on file  Occupational History  . Not on file  Tobacco Use  . Smoking status: Never  Smoker  . Smokeless tobacco: Never Used  Substance and Sexual Activity  . Alcohol use: No    Alcohol/week: 0.0 standard drinks  . Drug use: No  . Sexual activity: Never  Other Topics Concern  . Not on file  Social History Narrative  . Not on file   Social Determinants of Health   Financial Resource Strain:   . Difficulty of Paying Living Expenses: Not on file  Food Insecurity:   . Worried About Charity fundraiser in the Last Year: Not on file  . Ran Out of Food in the Last Year: Not on file  Transportation Needs:   . Lack of Transportation (Medical): Not on file  . Lack of Transportation (Non-Medical): Not on file  Physical Activity:   . Days of Exercise per Week: Not on file  . Minutes of Exercise per Session: Not on file  Stress:   . Feeling of Stress : Not on file  Social Connections:   . Frequency of Communication with Friends and Family: Not on file  . Frequency of Social Gatherings with Friends and Family: Not on file  . Attends Religious Services: Not on file  . Active Member of Clubs or Organizations: Not on file  . Attends Archivist Meetings: Not on file  . Marital Status: Not on file     Review of Systems: A 12 point ROS discussed and pertinent positives are indicated in the HPI above.  All other systems are negative.  Review of Systems  Vital Signs: BP (!) 182/94   Pulse 72   Temp 97.9 F (36.6 C) (Oral)   Resp 16   SpO2 100%   Physical Exam Constitutional:      General: She is not in acute distress.    Appearance: Normal appearance. She is not ill-appearing.  HENT:     Mouth/Throat:     Mouth: Mucous membranes are moist.     Pharynx: Oropharynx is clear.  Cardiovascular:     Rate and Rhythm: Normal rate and regular rhythm.     Heart sounds: Normal heart sounds.  Pulmonary:     Effort: Pulmonary effort is normal. No respiratory distress.     Breath sounds: Normal breath sounds.  Skin:    General: Skin is warm and dry.    Neurological:     General: No focal deficit present.     Mental Status: She is alert and oriented to person, place, and time.  Psychiatric:        Mood and Affect: Mood normal.        Thought Content: Thought content normal.        Judgment: Judgment normal.     Imaging: No results found.  Labs:  CBC: Recent Labs  04/03/19 [id number] 04/30/19 0927 05/28/19 0922 07/29/19 1309  WBC 5.6 4.9 4.8 5.8  HGB 11.9* 11.6* 12.1 12.7  HCT 36.6 35.9* 36.5 40.6  PLT 215 196 204 231    COAGS: Recent Labs    07/29/19 1309  INR 0.9    BMP: Recent Labs    04/03/19 0952 04/30/19 0927 05/28/19 0922 07/29/19 1309  NA 142 140 142 141  K 4.3 4.2 4.3 4.1  CL 108 107 107 105  CO2 28 28 28 23   GLUCOSE 86 83 81 92  BUN 13 14 14 15   CALCIUM 9.2 8.8* 8.9 9.3  CREATININE 0.91 1.02* 0.96 0.97  GFRNONAA 60* 52* 56* 55*  GFRAA >60 >60 >60 >60    LIVER FUNCTION TESTS: Recent Labs    03/07/19 1352 04/03/19 0952 04/30/19 0927 05/28/19 0922  BILITOT 0.4 0.6 0.5 0.6  AST 20 21 22 24   ALT 9 10 10 11   ALKPHOS 73 64 55 58  PROT 6.8 6.9 6.5 6.8  ALBUMIN 3.5 3.6 3.6 3.6    TUMOR MARKERS: No results for input(s): AFPTM, CEA, CA199, CHROMGRNA in the last 8760 hours.  Assessment and Plan: Colorectal cancer For port placement Labs reviewed. Risks and benefits of image guided port-a-catheter placement was discussed with the patient including, but not limited to bleeding, infection, pneumothorax, or fibrin sheath development and need for additional procedures.  All of the patient's questions were answered, patient is agreeable to proceed. Consent signed and in chart.    Thank you for this interesting consult.  I greatly enjoyed meeting MALANEY ORT and look forward to participating in their care.  A copy of this report was sent to the requesting provider on this date.  Electronically Signed: Ascencion Dike, PA-C 07/29/2019, 2:12 PM   I spent a total of 20 minutes in face  to face in clinical consultation, greater than 50% of which was counseling/coordinating care for port placement

## 2019-07-29 NOTE — Procedures (Signed)
Interventional Radiology Procedure Note  Procedure: Placement of a right IJ approach single lumen PowerPort.  Tip is positioned at the superior cavoatrial junction and catheter is ready for immediate use.  Complications: No immediate Recommendations:  - Ok to shower tomorrow - Do not submerge for 7 days - Routine line care   Signed,  Iosefa Weintraub K. Jaeleigh Monaco, MD   

## 2019-07-29 NOTE — Discharge Instructions (Signed)
Implanted Port Insertion, Care After This sheet gives you information about how to care for yourself after your procedure. Your health care provider may also give you more specific instructions. If you have problems or questions, contact your health care provider. What can I expect after the procedure? After the procedure, it is common to have:  Discomfort at the port insertion site.  Bruising on the skin over the port. This should improve over 3-4 days. Follow these instructions at home: [facility] care  After your port is placed, you will get a manufacturer's information card. The card has information about your port. Keep this card with you at all times.  Take care of the port as told by your health care provider. Ask your health care provider if you or a family member can get training for taking care of the port at home. A home health care nurse may also take care of the port.  Make sure to remember what type of port you have. Incision care      Follow instructions from your health care provider about how to take care of your port insertion site. Make sure you: ? Wash your hands with soap and water before and after you change your bandage (dressing). If soap and water are not available, use hand sanitizer. ? Change your dressing as told by your health care provider.  Leave  skin glue in place. These skin closures may need to stay in place for 2 weeks or longer.   Check your port insertion site every day for signs of infection. Check for: ? Redness, swelling, or pain. ? Fluid or blood. ? Warmth. ? Pus or a bad smell. Activity  Return to your normal activities as told by your health care provider. Ask your health care provider what activities are safe for you.  Do not lift anything that is heavier than 10 lb (4.5 kg), or the limit that you are told, until your health care provider says that it is safe. General instructions  Take over-the-counter and prescription medicines only as  told by your health care provider.  Do not take baths, swim, or use a hot tub until your health care provider approves. Ask your health care provider if you may take showers. You may only be allowed to take sponge baths.  Do not drive for 24 hours if you were given a sedative during your procedure.  Wear a medical alert bracelet in case of an emergency. This will tell any health care providers that you have a port.  Keep all follow-up visits as told by your health care provider. This is important. Contact a health care provider if:  You cannot flush your port with saline as directed, or you cannot draw blood from the port.  You have a fever or chills.  You have redness, swelling, or pain around your port insertion site.  You have fluid or blood coming from your port insertion site.  Your port insertion site feels warm to the touch.  You have pus or a bad smell coming from the port insertion site. Get help right away if:  You have chest pain or shortness of breath.  You have bleeding from your port that you cannot control. Summary  Take care of the port as told by your health care provider. Keep the manufacturer's information card with you at all times.  Change your dressing as told by your health care provider.  Contact a health care provider if you have a fever or  chills or if you have redness, swelling, or pain around your port insertion site.  Keep all follow-up visits as told by your health care provider. This information is not intended to replace advice given to you by your health care provider. Make sure you discuss any questions you have with your health care provider. Document Released: 05/22/2013 Document Revised: 02/27/2018 Document Reviewed: 02/27/2018 Elsevier Patient Education  [place].       Moderate Conscious Sedation, Adult, Care After These instructions provide you with information about caring for yourself after your procedure. Your health  care provider may also give you more specific instructions. Your treatment has been planned according to current medical practices, but problems sometimes occur. Call your health care provider if you have any problems or questions after your procedure. What can I expect after the procedure? After your procedure, it is common:  To feel sleepy for several hours.  To feel clumsy and have poor balance for several hours.  To have poor judgment for several hours.  To vomit if you eat too soon. Follow these instructions at home: For at least 24 hours after the procedure:   Do not: ? Participate in activities where you could fall or become injured. ? Drive. ? Use heavy machinery. ? Drink alcohol. ? Take sleeping pills or medicines that cause drowsiness. ? Make important decisions or sign legal documents. ? Take care of children on your own.  Rest. Eating and drinking  Follow the diet recommended by your health care provider.  If you vomit: ? Drink water, juice, or soup when you can drink without vomiting. ? Make sure you have little or no nausea before eating solid foods. General instructions  Have a responsible adult stay with you until you are awake and alert.  Take over-the-counter and prescription medicines only as told by your health care provider.  If you smoke, do not smoke without supervision.  Keep all follow-up visits as told by your health care provider. This is important. Contact a health care provider if:  You keep feeling nauseous or you keep vomiting.  You feel light-headed.  You develop a rash.  You have a fever. Get help right away if:  You have trouble breathing. This information is not intended to replace advice given to you by your health care provider. Make sure you discuss any questions you have with your health care provider. Document Released: 05/22/2013 Document Revised: 07/14/2017 Document Reviewed: 11/21/2015 Elsevier Patient Education  2020  [employer].

## 2019-08-05 ENCOUNTER — Ambulatory Visit (HOSPITAL_COMMUNITY)
Admission: RE | Admit: 2019-08-05 | Discharge: 2019-08-05 | Disposition: A | Discharge status: Home / Self Care | Payer: Medicare Other | Source: Ambulatory Visit | Attending: Oncology | Admitting: Oncology

## 2019-08-05 ENCOUNTER — Other Ambulatory Visit: Payer: Self-pay

## 2019-08-05 DIAGNOSIS — C787 Secondary malignant neoplasm of liver and intrahepatic bile duct: Secondary | ICD-10-CM | POA: Diagnosis present

## 2019-08-05 DIAGNOSIS — C189 Malignant neoplasm of colon, unspecified: Secondary | ICD-10-CM | POA: Diagnosis not present

## 2019-08-11 ENCOUNTER — Other Ambulatory Visit: Payer: Self-pay | Admitting: Oncology

## 2019-08-13 ENCOUNTER — Inpatient Hospital Stay: Payer: Medicare Other

## 2019-08-13 ENCOUNTER — Other Ambulatory Visit: Payer: Self-pay

## 2019-08-13 ENCOUNTER — Inpatient Hospital Stay: Payer: Medicare Other | Admitting: Oncology

## 2019-08-13 VITALS — BP 147/92 | HR 60

## 2019-08-13 VITALS — BP 178/90 | HR 63 | Temp 98.3°F | Resp 17 | Ht 61.0 in | Wt 145.0 lb

## 2019-08-13 DIAGNOSIS — C787 Secondary malignant neoplasm of liver and intrahepatic bile duct: Secondary | ICD-10-CM

## 2019-08-13 DIAGNOSIS — C189 Malignant neoplasm of colon, unspecified: Secondary | ICD-10-CM

## 2019-08-13 DIAGNOSIS — Z95828 Presence of other vascular implants and grafts: Secondary | ICD-10-CM | POA: Insufficient documentation

## 2019-08-13 DIAGNOSIS — Z5111 Encounter for antineoplastic chemotherapy: Secondary | ICD-10-CM | POA: Diagnosis not present

## 2019-08-13 LAB — CBC WITH DIFFERENTIAL (CANCER CENTER ONLY)
Abs Immature Granulocytes: 0.01 10*3/uL (ref 0.00–0.07)
Basophils Absolute: 0 10*3/uL (ref 0.0–0.1)
Basophils Relative: 1 %
Eosinophils Absolute: 0.2 10*3/uL (ref 0.0–0.5)
Eosinophils Relative: 3 %
HCT: 36.9 % (ref 36.0–46.0)
Hemoglobin: 11.9 g/dL — ABNORMAL LOW (ref 12.0–15.0)
Immature Granulocytes: 0 %
Lymphocytes Relative: 19 %
Lymphs Abs: 1 10*3/uL (ref 0.7–4.0)
MCH: 31.2 pg (ref 26.0–34.0)
MCHC: 32.2 g/dL (ref 30.0–36.0)
MCV: 96.9 fL (ref 80.0–100.0)
Monocytes Absolute: 0.4 10*3/uL (ref 0.1–1.0)
Monocytes Relative: 8 %
Neutro Abs: 3.4 10*3/uL (ref 1.7–7.7)
Neutrophils Relative %: 69 %
Platelet Count: 179 10*3/uL (ref 150–400)
RBC: 3.81 MIL/uL — ABNORMAL LOW (ref 3.87–5.11)
RDW: 13.2 % (ref 11.5–15.5)
WBC Count: 5 10*3/uL (ref 4.0–10.5)
nRBC: 0 % (ref 0.0–0.2)

## 2019-08-13 LAB — CMP (CANCER CENTER ONLY)
ALT: 12 U/L (ref 0–44)
AST: 25 U/L (ref 15–41)
Albumin: 3.5 g/dL (ref 3.5–5.0)
Alkaline Phosphatase: 61 U/L (ref 38–126)
Anion gap: 9 (ref 5–15)
BUN: 14 mg/dL (ref 8–23)
CO2: 26 mmol/L (ref 22–32)
Calcium: 8.9 mg/dL (ref 8.9–10.3)
Chloride: 108 mmol/L (ref 98–111)
Creatinine: 0.84 mg/dL (ref 0.44–1.00)
GFR, Est AFR Am: >60 mL/min (ref >60)
GFR, Estimated: >60 mL/min (ref >60)
Glucose, Bld: 83 mg/dL (ref 70–99)
Potassium: 4.2 mmol/L (ref 3.5–5.1)
Sodium: 143 mmol/L (ref 135–145)
Total Bilirubin: 0.5 mg/dL (ref 0.3–1.2)
Total Protein: 6.7 g/dL (ref 6.5–8.1)

## 2019-08-13 LAB — CEA (IN HOUSE-CHCC): CEA (CHCC-In House): 3.18 ng/mL (ref 0.00–5.00)

## 2019-08-13 MED ORDER — SODIUM CHLORIDE 0.9 % IV SOLN
Freq: Once | INTRAVENOUS | Status: AC
  Filled 2019-08-13: qty 250

## 2019-08-13 MED ORDER — SODIUM CHLORIDE 0.9% FLUSH
10.0000 mL | INTRAVENOUS | Status: DC | PRN
  Filled 2019-08-13: qty 10

## 2019-08-13 MED ORDER — SODIUM CHLORIDE 0.9 % IV SOLN
180.0000 mg/m2 | Freq: Once | INTRAVENOUS | Status: AC
  Administered 2019-08-13: 300 mg via INTRAVENOUS
  Filled 2019-08-13: qty 15

## 2019-08-13 MED ORDER — ATROPINE SULFATE 1 MG/ML IJ SOLN
INTRAMUSCULAR | Status: AC
  Filled 2019-08-13: qty 1

## 2019-08-13 MED ORDER — PALONOSETRON HCL INJECTION 0.25 MG/5ML
0.2500 mg | Freq: Once | INTRAVENOUS | Status: AC
  Administered 2019-08-13: 0.25 mg via INTRAVENOUS

## 2019-08-13 MED ORDER — PALONOSETRON HCL INJECTION 0.25 MG/5ML
INTRAVENOUS | Status: AC
  Filled 2019-08-13: qty 5

## 2019-08-13 MED ORDER — DEXAMETHASONE SODIUM PHOSPHATE 10 MG/ML IJ SOLN
10.0000 mg | Freq: Once | INTRAMUSCULAR | Status: AC
  Administered 2019-08-13: 10 mg via INTRAVENOUS

## 2019-08-13 MED ORDER — SODIUM CHLORIDE 0.9 % IV SOLN
400.0000 mg/m2 | Freq: Once | INTRAVENOUS | Status: AC
  Administered 2019-08-13: 676 mg via INTRAVENOUS
  Filled 2019-08-13: qty 33.8

## 2019-08-13 MED ORDER — ATROPINE SULFATE 1 MG/ML IJ SOLN
0.5000 mg | Freq: Once | INTRAMUSCULAR | Status: AC | PRN
  Administered 2019-08-13: 0.5 mg via INTRAVENOUS

## 2019-08-13 MED ORDER — SODIUM CHLORIDE 0.9 % IV SOLN
2000.0000 mg/m2 | INTRAVENOUS | Status: DC
  Administered 2019-08-13: 3400 mg via INTRAVENOUS
  Filled 2019-08-13: qty 68

## 2019-08-13 MED ORDER — HEPARIN SOD (PORK) LOCK FLUSH 100 UNIT/ML IV SOLN
500.0000 [IU] | Freq: Once | INTRAVENOUS | Status: DC | PRN
  Filled 2019-08-13: qty 5

## 2019-08-13 MED ORDER — SODIUM CHLORIDE 0.9% FLUSH
10.0000 mL | INTRAVENOUS | Status: DC | PRN
  Administered 2019-08-13: 10 mL
  Filled 2019-08-13: qty 10

## 2019-08-13 MED ORDER — DEXAMETHASONE SODIUM PHOSPHATE 10 MG/ML IJ SOLN
INTRAMUSCULAR | Status: AC
  Filled 2019-08-13: qty 1

## 2019-08-13 NOTE — Patient Instructions (Addendum)
[facility] Discharge Instructions for Patients Receiving Chemotherapy  Today you received the following chemotherapy agents: Irinotecan/Leucovorin/Fluorouracil  To help prevent nausea and vomiting after your treatment, we encourage you to take your nausea medication as directed.   If you develop nausea and vomiting that is not controlled by your nausea medication, call the clinic.   BELOW ARE SYMPTOMS THAT SHOULD BE REPORTED IMMEDIATELY:  *FEVER GREATER THAN 100.5 F  *CHILLS WITH OR WITHOUT FEVER  NAUSEA AND VOMITING THAT IS NOT CONTROLLED WITH YOUR NAUSEA MEDICATION  *UNUSUAL SHORTNESS OF BREATH  *UNUSUAL BRUISING OR BLEEDING  TENDERNESS IN MOUTH AND THROAT WITH OR WITHOUT PRESENCE OF ULCERS  *URINARY PROBLEMS  *BOWEL PROBLEMS  UNUSUAL RASH Items with * indicate a potential emergency and should be followed up as soon as possible.  Feel free to call the clinic should you have any questions or concerns. The clinic phone number is [phone].  Please show the [facility] at check-in to the Emergency Department and triage nurse.  Irinotecan injection What is this medicine? IRINOTECAN (ir in oh TEE kan ) is a chemotherapy drug. It is used to treat colon and rectal cancer. This medicine may be used for other purposes; ask your health care provider or pharmacist if you have questions. COMMON BRAND NAME(S): Camptosar What should I tell my health care provider before I take this medicine? They need to know if you have any of these conditions:  dehydration  diarrhea  infection (especially a virus infection such as chickenpox, cold sores, or herpes)  liver disease  low blood counts, like low white cell, platelet, or red cell counts  low levels of calcium, magnesium, or potassium in the blood  recent or ongoing radiation therapy  an unusual or allergic reaction to irinotecan, other medicines, foods, dyes, or preservatives  pregnant or trying to  get pregnant  breast-feeding How should I use this medicine? This drug is given as an infusion into a vein. It is administered in a hospital or clinic by a specially trained health care professional. Talk to your pediatrician regarding the use of this medicine in children. Special care may be needed. Overdosage: If you think you have taken too much of this medicine contact a poison control center or emergency room at once. NOTE: This medicine is only for you. Do not share this medicine with others. What if I miss a dose? It is important not to miss your dose. Call your doctor or health care professional if you are unable to keep an appointment. What may interact with this medicine? This medicine may interact with the following medications:  antiviral medicines for HIV or AIDS  certain antibiotics like rifampin or rifabutin  certain medicines for fungal infections like itraconazole, ketoconazole, posaconazole, and voriconazole  certain medicines for seizures like carbamazepine, phenobarbital, phenotoin  clarithromycin  gemfibrozil  nefazodone  St. John's Wort This list may not describe all possible interactions. Give your health care provider a list of all the medicines, herbs, non-prescription drugs, or dietary supplements you use. Also tell them if you smoke, drink alcohol, or use illegal drugs. Some items may interact with your medicine. What should I watch for while using this medicine? Your condition will be monitored carefully while you are receiving this medicine. You will need important blood work done while you are taking this medicine. This drug may make you feel generally unwell. This is not uncommon, as chemotherapy can affect healthy cells as well as cancer cells. Report  any side effects. Continue your course of treatment even though you feel ill unless your doctor tells you to stop. In some cases, you may be given additional medicines to help with side effects. Follow all  directions for their use. You may get drowsy or dizzy. Do not drive, use machinery, or do anything that needs mental alertness until you know how this medicine affects you. Do not stand or sit up quickly, especially if you are an older patient. This reduces the risk of dizzy or fainting spells. Call your health care professional for advice if you get a fever, chills, or sore throat, or other symptoms of a cold or flu. Do not treat yourself. This medicine decreases your body's ability to fight infections. Try to avoid being around people who are sick. Avoid taking products that contain aspirin, acetaminophen, ibuprofen, naproxen, or ketoprofen unless instructed by your doctor. These medicines may hide a fever. This medicine may increase your risk to bruise or bleed. Call your doctor or health care professional if you notice any unusual bleeding. Be careful brushing and flossing your teeth or using a toothpick because you may get an infection or bleed more easily. If you have any dental work done, tell your dentist you are receiving this medicine. Do not become pregnant while taking this medicine or for 6 months after stopping it. Women should inform their health care professional if they wish to become pregnant or think they might be pregnant. Men should not father a child while taking this medicine and for 3 months after stopping it. There is potential for serious side effects to an unborn child. Talk to your health care professional for more information. Do not breast-feed an infant while taking this medicine or for 7 days after stopping it. This medicine has caused ovarian failure in some women. This medicine may make it more difficult to get pregnant. Talk to your health care professional if you are concerned about your fertility. This medicine has caused decreased sperm counts in some men. This may make it more difficult to father a child. Talk to your health care professional if you are concerned about  your fertility. What side effects may I notice from receiving this medicine? Side effects that you should report to your doctor or health care professional as soon as possible:  allergic reactions like skin rash, itching or hives, swelling of the face, lips, or tongue  chest pain  diarrhea  flushing, runny nose, sweating during infusion  low blood counts - this medicine may decrease the number of white blood cells, red blood cells and platelets. You may be at increased risk for infections and bleeding.  nausea, vomiting  pain, swelling, warmth in the leg  signs of decreased platelets or bleeding - bruising, pinpoint red spots on the skin, black, tarry stools, blood in the urine  signs of infection - fever or chills, cough, sore throat, pain or difficulty passing urine  signs of decreased red blood cells - unusually weak or tired, fainting spells, lightheadedness Side effects that usually do not require medical attention (report to your doctor or health care professional if they continue or are bothersome):  constipation  hair loss  headache  loss of appetite  mouth sores  stomach pain This list may not describe all possible side effects. Call your doctor for medical advice about side effects. You may report side effects to FDA at 1-800-FDA-1088. Where should I keep my medicine? This drug is given in a hospital or clinic  and will not be stored at home. NOTE: This sheet is a summary. It may not cover all possible information. If you have questions about this medicine, talk to your doctor, pharmacist, or health care provider.  2020 Elsevier/Gold Standard (2018-09-21 10:09:17)  Leucovorin injection What is this medicine? LEUCOVORIN (loo koe VOR in) is used to prevent or treat the harmful effects of some medicines. This medicine is used to treat anemia caused by a low amount of folic acid in the body. It is also used with 5-fluorouracil (5-FU) to treat colon cancer. This  medicine may be used for other purposes; ask your health care provider or pharmacist if you have questions. What should I tell my health care provider before I take this medicine? They need to know if you have any of these conditions:  anemia from low levels of vitamin B-12 in the blood  an unusual or allergic reaction to leucovorin, folic acid, other medicines, foods, dyes, or preservatives  pregnant or trying to get pregnant  breast-feeding How should I use this medicine? This medicine is for injection into a muscle or into a vein. It is given by a health care professional in a hospital or clinic setting. Talk to your pediatrician regarding the use of this medicine in children. Special care may be needed. Overdosage: If you think you have taken too much of this medicine contact a poison control center or emergency room at once. NOTE: This medicine is only for you. Do not share this medicine with others. What if I miss a dose? This does not apply. What may interact with this medicine?  capecitabine  fluorouracil  phenobarbital  phenytoin  primidone  trimethoprim-sulfamethoxazole This list may not describe all possible interactions. Give your health care provider a list of all the medicines, herbs, non-prescription drugs, or dietary supplements you use. Also tell them if you smoke, drink alcohol, or use illegal drugs. Some items may interact with your medicine. What should I watch for while using this medicine? Your condition will be monitored carefully while you are receiving this medicine. This medicine may increase the side effects of 5-fluorouracil, 5-FU. Tell your doctor or health care professional if you have diarrhea or mouth sores that do not get better or that get worse. What side effects may I notice from receiving this medicine? Side effects that you should report to your doctor or health care professional as soon as possible:  allergic reactions like skin rash,  itching or hives, swelling of the face, lips, or tongue  breathing problems  fever, infection  mouth sores  unusual bleeding or bruising  unusually weak or tired Side effects that usually do not require medical attention (report to your doctor or health care professional if they continue or are bothersome):  constipation or diarrhea  loss of appetite  nausea, vomiting This list may not describe all possible side effects. Call your doctor for medical advice about side effects. You may report side effects to FDA at 1-800-FDA-1088. Where should I keep my medicine? This drug is given in a hospital or clinic and will not be stored at home. NOTE: This sheet is a summary. It may not cover all possible information. If you have questions about this medicine, talk to your doctor, pharmacist, or health care provider.  2020 Elsevier/Gold Standard (2008-02-05 16:50:29)  Fluorouracil, 5-FU injection What is this medicine? FLUOROURACIL, 5-FU (flure oh YOOR a sil) is a chemotherapy drug. It slows the growth of cancer cells. This medicine is used to  treat many types of cancer like breast cancer, colon or rectal cancer, pancreatic cancer, and stomach cancer. This medicine may be used for other purposes; ask your health care provider or pharmacist if you have questions. COMMON BRAND NAME(S): Adrucil What should I tell my health care provider before I take this medicine? They need to know if you have any of these conditions:  blood disorders  dihydropyrimidine dehydrogenase (DPD) deficiency  infection (especially a virus infection such as chickenpox, cold sores, or herpes)  kidney disease  liver disease  malnourished, poor nutrition  recent or ongoing radiation therapy  an unusual or allergic reaction to fluorouracil, other chemotherapy, other medicines, foods, dyes, or preservatives  pregnant or trying to get pregnant  breast-feeding How should I use this medicine? This drug is  given as an infusion or injection into a vein. It is administered in a hospital or clinic by a specially trained health care professional. Talk to your pediatrician regarding the use of this medicine in children. Special care may be needed. Overdosage: If you think you have taken too much of this medicine contact a poison control center or emergency room at once. NOTE: This medicine is only for you. Do not share this medicine with others. What if I miss a dose? It is important not to miss your dose. Call your doctor or health care professional if you are unable to keep an appointment. What may interact with this medicine?  allopurinol  cimetidine  dapsone  digoxin  hydroxyurea  leucovorin  levamisole  medicines for seizures like ethotoin, fosphenytoin, phenytoin  medicines to increase blood counts like filgrastim, pegfilgrastim, sargramostim  medicines that treat or prevent blood clots like warfarin, enoxaparin, and dalteparin  methotrexate  metronidazole  pyrimethamine  some other chemotherapy drugs like busulfan, cisplatin, estramustine, vinblastine  trimethoprim  trimetrexate  vaccines Talk to your doctor or health care professional before taking any of these medicines:  acetaminophen  aspirin  ibuprofen  ketoprofen  naproxen This list may not describe all possible interactions. Give your health care provider a list of all the medicines, herbs, non-prescription drugs, or dietary supplements you use. Also tell them if you smoke, drink alcohol, or use illegal drugs. Some items may interact with your medicine. What should I watch for while using this medicine? Visit your doctor for checks on your progress. This drug may make you feel generally unwell. This is not uncommon, as chemotherapy can affect healthy cells as well as cancer cells. Report any side effects. Continue your course of treatment even though you feel ill unless your doctor tells you to stop. In  some cases, you may be given additional medicines to help with side effects. Follow all directions for their use. Call your doctor or health care professional for advice if you get a fever, chills or sore throat, or other symptoms of a cold or flu. Do not treat yourself. This drug decreases your body's ability to fight infections. Try to avoid being around people who are sick. This medicine may increase your risk to bruise or bleed. Call your doctor or health care professional if you notice any unusual bleeding. Be careful brushing and flossing your teeth or using a toothpick because you may get an infection or bleed more easily. If you have any dental work done, tell your dentist you are receiving this medicine. Avoid taking products that contain aspirin, acetaminophen, ibuprofen, naproxen, or ketoprofen unless instructed by your doctor. These medicines may hide a fever. Do not become pregnant  while taking this medicine. Women should inform their doctor if they wish to become pregnant or think they might be pregnant. There is a potential for serious side effects to an unborn child. Talk to your health care professional or pharmacist for more information. Do not breast-feed an infant while taking this medicine. Men should inform their doctor if they wish to father a child. This medicine may lower sperm counts. Do not treat diarrhea with over the counter products. Contact your doctor if you have diarrhea that lasts more than 2 days or if it is severe and watery. This medicine can make you more sensitive to the sun. Keep out of the sun. If you cannot avoid being in the sun, wear protective clothing and use sunscreen. Do not use sun lamps or tanning beds/booths. What side effects may I notice from receiving this medicine? Side effects that you should report to your doctor or health care professional as soon as possible:  allergic reactions like skin rash, itching or hives, swelling of the face, lips, or  tongue  low blood counts - this medicine may decrease the number of white blood cells, red blood cells and platelets. You may be at increased risk for infections and bleeding.  signs of infection - fever or chills, cough, sore throat, pain or difficulty passing urine  signs of decreased platelets or bleeding - bruising, pinpoint red spots on the skin, black, tarry stools, blood in the urine  signs of decreased red blood cells - unusually weak or tired, fainting spells, lightheadedness  breathing problems  changes in vision  chest pain  mouth sores  nausea and vomiting  pain, swelling, redness at site where injected  pain, tingling, numbness in the hands or feet  redness, swelling, or sores on hands or feet  stomach pain  unusual bleeding Side effects that usually do not require medical attention (report to your doctor or health care professional if they continue or are bothersome):  changes in finger or toe nails  diarrhea  dry or itchy skin  hair loss  headache  loss of appetite  sensitivity of eyes to the light  stomach upset  unusually teary eyes This list may not describe all possible side effects. Call your doctor for medical advice about side effects. You may report side effects to FDA at 1-800-FDA-1088. Where should I keep my medicine? This drug is given in a hospital or clinic and will not be stored at home. NOTE: This sheet is a summary. It may not cover all possible information. If you have questions about this medicine, talk to your doctor, pharmacist, or health care provider.  2020 Elsevier/Gold Standard (2007-12-05 13:53:16)

## 2019-08-13 NOTE — Progress Notes (Signed)
Cynthia [name] OFFICE PROGRESS NOTE   Diagnosis: Colon cancer  INTERVAL HISTORY:   Cynthia [name] underwent Port-A-Cath placement 07/29/2019.  She returns for cycle 1 FOLFIRI.  She has chronic mild exertional dyspnea.  No chest pain or cough.  Objective:  Vital signs in last 24 hours:  Blood pressure (!) 178/90, pulse 63, temperature 98.3 F (36.8 C), temperature source Temporal, resp. rate 17, height '5\' 1"'$  (1.549 m), weight 145 lb (65.8 kg), SpO2 98 %.    Resp: Good air movement bilaterally, end inspiratory rub at the right upper posterior chest, no respiratory distress Cardio: Regular rate and rhythm GI: No hepatomegaly, nontender Vascular: No leg edema Musculoskeletal: Mild induration at the right lateral chest wall drain site  Portacath/PICC-without erythema  Lab Results:  Lab Results  Component Value Date   WBC 5.0 08/13/2019   HGB 11.9 (L) 08/13/2019   HCT 36.9 08/13/2019   MCV 96.9 08/13/2019   PLT 179 08/13/2019   NEUTROABS 3.4 08/13/2019    CMP  Lab Results  Component Value Date   NA 141 07/29/2019   K 4.1 07/29/2019   CL 105 07/29/2019   CO2 23 07/29/2019   GLUCOSE 92 07/29/2019   BUN 15 07/29/2019   CREATININE 0.97 07/29/2019   CALCIUM 9.3 07/29/2019   PROT 6.8 05/28/2019   ALBUMIN 3.6 05/28/2019   AST 24 05/28/2019   ALT 11 05/28/2019   ALKPHOS 58 05/28/2019   BILITOT 0.6 05/28/2019   GFRNONAA 55 (L) 07/29/2019   GFRAA >60 07/29/2019    Lab Results  Component Value Date   CEA1 3.06 05/28/2019      Imaging:  CT chest 07/29/2019-images reviewed Medications: I have reviewed the patient's current medications.   Assessment/Plan: 1. Moderately differentiated adenocarcinoma of the cecum, stage III (T4a, N1), status post a laparoscopic assisted right colectomy 10/22/2013  The tumor returned microsatellite stable with normal mismatch repair protein expression   Cycle 1 adjuvant Xeloda 12/25/2013.   Cycle 2 adjuvant Xeloda  01/15/2014.   Cycle 3 adjuvant Xeloda 02/05/2014.   Cycle 4 adjuvant Xeloda 03/18/2014, discontinued after 4 days secondary to diarrhea   Cycle 5 adjuvant Xeloda 04/16/2014, Xeloda dose reduced to 1000 mg in the a.m. and 500 mg in the p.m.   Cycle 6 adjuvant Xeloda 05/07/2014.  Cycle 7 adjuvant Xeloda 05/28/2014.  Cycle 8 adjuvant Xeloda 06/18/2014.  Restaging CTs on 07/14/2014 with no evidence of metastatic colon cancer  Colonoscopy 10/02/2014 with diverticulosis in the sigmoid colon and in the descending colon. One 4 mm polyp in the sigmoid colon with complete resection. Polyp tissue not retrieved. Internal hemorrhoids. Next colonoscopy in 3 years for surveillance.  Restaging CTs 07/16/2015 with 2 new hypodense liver lesions concerning for metastases, no other evidence of disease progression  PET scan 07/28/2015 with a single hypermetabolic right liver lesion, no other evidence of metastatic disease  Ultrasound-guided biopsy of the right liver lesion 07/30/2015 confirmed metastatic colon cancer, MSS, tumor mutation burden 1, KRAS G12D, PIK3CA, PTEN  MRI abdomen 08/20/2015 with similar to slight decrease in size of 2 hepatic metastasis. No new liver lesions or extrahepatic metastatic disease identified.  Radiofrequency ablation of 2 liver lesions 09/25/2015  MRI abdomen 02/02/2016-coagulative necrosis at the 2 ablated liver tumor sites,with no enhancement to suggest residual or recurrent liver tumor. No new liver masses or other findings of metastatic disease in the abdomen.  MRI abdomen 08/10/2016 revealed stable ablation sites, no evidence of progressive metastatic disease  Restaging CT 02/08/2017-stable treated  liver lesions, no evidence of progressive metastatic disease, stable borderline enlarged right inguinal node  CEA with stable mild elevation 02/08/2017, 03/01/2017, 03/29/2017,04/19/2017  CEA further elevated 08/21/2017  CTs 09/05/2017-possible local recurrence of  tumor along the ablation site right hepatic lobe lesion. Cephalad to the ablation site approximately 1.9 x 2.1cmsoft tissue mass progressive compared to the prior exam.  Liver SBRT 10/17/2017, 10/19/2017, 10/23/2017, 10/25/2017, 10/27/2017  CT 02/19/2018-progressive ill-defined areas of low attenuation with enhancement surrounding the segment 6 ablation site, stable segment 8 ablation site enlargement of extracapsular lesion posteriorly, no new hepatic lesions, wall thickening/edema at the distal ileum  CT 05/22/2018-2 new lung nodules suspicious for metastases, stable segment 8 ablation zone, slight decrease in the size of delayed perfusion at the segment 6 ablation zone, posterior extracapsular lesion is slightly smaller  CT chest 07/13/2018-multiple lung nodules consistent with metastatic disease  Cycle 1 Xeloda 07/23/2018  Cycle 2 Xeloda 08/13/2018  Cycle 3 Xeloda 09/03/2018  Cycle 4 Xeloda 09/24/2018  Restaging chest CT 10/12/2018-scattered pulmonary nodules/metastases mildly improved.  Cycle 5 Xeloda 10/15/2018  Cycle 6 Xeloda 11/05/2018  Cycle 7 Xeloda 11/26/2018  Cycle 8 Xeloda 12/17/2018  Cycle 9 Xeloda 01/07/2019  CT chest 01/24/2019-stable pulmonary nodules. No new pulmonary nodules. Stable left hepatic lobe cysts and right hepatic lobe lesions. No new hepatic lesions. Stable right ninth and 10th rib lesions.  Xeloda daily dosing schedule beginning 01/28/2019 due to hand-foot syndrome  CT 05/27/2019- enlargement of lung metastases, similar right hepatic lobe lesion  CT 08/05/2019-enlargement of bilateral lung nodules, no new lesions, stable right hepatic lesion  Cycle 1 FOLFIRI 08/13/2019 2. Right ovary cystadenofibroma, status post a bilateral oophorectomy 10/22/2013 3. Remote history of cervical cancer. 4. Iron deficiency anemia-resolved. 5. Right calf deep vein thrombosis 10/24/2013-she completed 3 months of xarelto. 6. Indeterminate 12 mm hypermetabolic right pelvic  density on the staging PET scan 10/04/2013-CT followup recommended per the GI tumor conference 11/13/2013. No longer visualized on a CT 07/14/2014. 7. Soft tissue implant overlying the posterior right liver on the CT 16/24/4695-QHK hypermetabolic on the PET scan 25/75/0518. 8. C. difficile colitis 03/05/2014. She completed a course of Flagyl, recurrent diarrhea beginning 03/21/2014, positive C. difficile toxin 03/26/2014-resumed Flagyl for planned 10 day course.  Vancomycin beginning 04/08/2014.  Taper complete 05/21/2014.  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 Xeloda dose/schedule adjusted beginning 01/28/2019.   Disposition: Cynthia [name] appears unchanged.  The restaging CT reveals mild progression of lung metastases.  The plan is to begin FOLFIRI today.  We again reviewed potential toxicities associated with the FOLFIRI regimen.  She agrees to proceed.  She will return for an office visit and chemotherapy in 2 weeks.  [name], MD  08/13/2019  8:16 AM

## 2019-08-14 ENCOUNTER — Telehealth: Payer: Self-pay | Admitting: *Deleted

## 2019-08-15 ENCOUNTER — Inpatient Hospital Stay: Payer: Medicare Other

## 2019-08-15 ENCOUNTER — Telehealth: Payer: Self-pay | Admitting: Oncology

## 2019-08-15 ENCOUNTER — Other Ambulatory Visit: Payer: Self-pay

## 2019-08-15 DIAGNOSIS — Z5111 Encounter for antineoplastic chemotherapy: Secondary | ICD-10-CM | POA: Diagnosis not present

## 2019-08-15 DIAGNOSIS — Z95828 Presence of other vascular implants and grafts: Secondary | ICD-10-CM

## 2019-08-15 MED ORDER — SODIUM CHLORIDE 0.9% FLUSH
10.0000 mL | INTRAVENOUS | Status: DC | PRN
  Administered 2019-08-15: 10 mL
  Filled 2019-08-15: qty 10

## 2019-08-15 MED ORDER — HEPARIN SOD (PORK) LOCK FLUSH 100 UNIT/ML IV SOLN
500.0000 [IU] | Freq: Once | INTRAVENOUS | Status: AC | PRN
  Administered 2019-08-15: 500 [IU]
  Filled 2019-08-15: qty 5

## 2019-08-15 NOTE — Telephone Encounter (Signed)
Scheduled per los. Called and left msg. Mailed printout  °

## 2019-08-21 ENCOUNTER — Encounter: Payer: Self-pay | Admitting: Oncology

## 2019-08-21 NOTE — Progress Notes (Signed)
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  Pt gave me consent to apply in her behalf so I applied to the [facility] and she was approved for $10,000 from 07/22/19 through 07/20/20 for Camptosar, Leucovorin and Adrucil.  Pt is overqualified for the [employer].  I will give her my card on 08/27/19 for any questions or concerns she may have in the future.

## 2019-08-25 ENCOUNTER — Other Ambulatory Visit: Payer: Self-pay | Admitting: Oncology

## 2019-08-27 ENCOUNTER — Encounter: Payer: Self-pay | Admitting: Nurse Practitioner

## 2019-08-27 ENCOUNTER — Inpatient Hospital Stay: Payer: Medicare PPO

## 2019-08-27 ENCOUNTER — Inpatient Hospital Stay: Payer: Medicare PPO | Attending: Oncology | Admitting: Nurse Practitioner

## 2019-08-27 ENCOUNTER — Telehealth: Payer: Self-pay | Admitting: Oncology

## 2019-08-27 ENCOUNTER — Other Ambulatory Visit: Payer: Self-pay

## 2019-08-27 VITALS — BP 155/67 | HR 73 | Temp 97.8°F | Resp 18 | Ht 61.0 in | Wt 142.5 lb

## 2019-08-27 DIAGNOSIS — C180 Malignant neoplasm of cecum: Secondary | ICD-10-CM | POA: Insufficient documentation

## 2019-08-27 DIAGNOSIS — Z5111 Encounter for antineoplastic chemotherapy: Secondary | ICD-10-CM | POA: Insufficient documentation

## 2019-08-27 DIAGNOSIS — C787 Secondary malignant neoplasm of liver and intrahepatic bile duct: Secondary | ICD-10-CM

## 2019-08-27 DIAGNOSIS — C7800 Secondary malignant neoplasm of unspecified lung: Secondary | ICD-10-CM | POA: Diagnosis not present

## 2019-08-27 DIAGNOSIS — C189 Malignant neoplasm of colon, unspecified: Secondary | ICD-10-CM

## 2019-08-27 DIAGNOSIS — Z95828 Presence of other vascular implants and grafts: Secondary | ICD-10-CM

## 2019-08-27 DIAGNOSIS — D709 Neutropenia, unspecified: Secondary | ICD-10-CM | POA: Diagnosis not present

## 2019-08-27 LAB — CBC WITH DIFFERENTIAL (CANCER CENTER ONLY)
Abs Immature Granulocytes: 0 10*3/uL (ref 0.00–0.07)
Basophils Absolute: 0 10*3/uL (ref 0.0–0.1)
Basophils Relative: 0 %
Eosinophils Absolute: 0.3 10*3/uL (ref 0.0–0.5)
Eosinophils Relative: 13 %
HCT: 32.6 % — ABNORMAL LOW (ref 36.0–46.0)
Hemoglobin: 10.6 g/dL — ABNORMAL LOW (ref 12.0–15.0)
Immature Granulocytes: 0 %
Lymphocytes Relative: 37 %
Lymphs Abs: 0.9 10*3/uL (ref 0.7–4.0)
MCH: 30.3 pg (ref 26.0–34.0)
MCHC: 32.5 g/dL (ref 30.0–36.0)
MCV: 93.1 fL (ref 80.0–100.0)
Monocytes Absolute: 0.3 10*3/uL (ref 0.1–1.0)
Monocytes Relative: 12 %
Neutro Abs: 0.9 10*3/uL — ABNORMAL LOW (ref 1.7–7.7)
Neutrophils Relative %: 38 %
Platelet Count: 168 10*3/uL (ref 150–400)
RBC: 3.5 MIL/uL — ABNORMAL LOW (ref 3.87–5.11)
RDW: 13.4 % (ref 11.5–15.5)
WBC Count: 2.3 10*3/uL — ABNORMAL LOW (ref 4.0–10.5)
nRBC: 0 % (ref 0.0–0.2)

## 2019-08-27 LAB — CMP (CANCER CENTER ONLY)
ALT: 16 U/L (ref 0–44)
AST: 21 U/L (ref 15–41)
Albumin: 3.3 g/dL — ABNORMAL LOW (ref 3.5–5.0)
Alkaline Phosphatase: 71 U/L (ref 38–126)
Anion gap: 9 (ref 5–15)
BUN: 12 mg/dL (ref 8–23)
CO2: 25 mmol/L (ref 22–32)
Calcium: 8.2 mg/dL — ABNORMAL LOW (ref 8.9–10.3)
Chloride: 109 mmol/L (ref 98–111)
Creatinine: 0.78 mg/dL (ref 0.44–1.00)
GFR, Est AFR Am: >60 mL/min (ref >60)
GFR, Estimated: >60 mL/min (ref >60)
Glucose, Bld: 85 mg/dL (ref 70–99)
Potassium: 4.1 mmol/L (ref 3.5–5.1)
Sodium: 143 mmol/L (ref 135–145)
Total Bilirubin: 0.3 mg/dL (ref 0.3–1.2)
Total Protein: 6.2 g/dL — ABNORMAL LOW (ref 6.5–8.1)

## 2019-08-27 MED ORDER — SODIUM CHLORIDE 0.9% FLUSH
10.0000 mL | INTRAVENOUS | Status: DC | PRN
  Administered 2019-08-27: 10 mL
  Filled 2019-08-27: qty 10

## 2019-08-27 NOTE — Progress Notes (Signed)
[place] OFFICE PROGRESS NOTE   Diagnosis: Colon cancer  INTERVAL HISTORY:   Cynthia [name] returns as scheduled.  She completed cycle 1 FOLFIRI 08/13/2019.  She denies nausea/vomiting.  No mouth sores.  She has had 2 loose stools since the chemotherapy.  Imodium relieved.  Main complaint is fatigue.  Objective:  Vital signs in last 24 hours:  Blood pressure (!) 155/67, pulse 73, temperature 97.8 F (36.6 C), temperature source Temporal, resp. rate 18, height _0  (1.549 m), weight 142 lb 8 oz (64.6 kg), SpO2 100 %.    HEENT: No thrush or ulcers. GI: Abdomen soft and nontender.  No hepatomegaly. Vascular: No leg edema. Musculoskeletal: Very mild induration at the right lateral chest wall drain site. Skin: Palms without erythema. Port-A-Cath without erythema.   Lab Results:  Lab Results  Component Value Date   WBC 2.3 (L) 08/27/2019   HGB 10.6 (L) 08/27/2019   HCT 32.6 (L) 08/27/2019   MCV 93.1 08/27/2019   PLT 168 08/27/2019   NEUTROABS 0.9 (L) 08/27/2019    Imaging:  No results found.  Medications: I have reviewed the patient's current medications.  Assessment/Plan: 1. Moderately differentiated adenocarcinoma of the cecum, stage III (T4a, N1), status post a laparoscopic assisted right colectomy 10/22/2013  The tumor returned microsatellite stable with normal mismatch repair protein expression   Cycle 1 adjuvant Xeloda 12/25/2013.   Cycle 2 adjuvant Xeloda 01/15/2014.   Cycle 3 adjuvant Xeloda 02/05/2014.   Cycle 4 adjuvant Xeloda 03/18/2014, discontinued after 4 days secondary to diarrhea   Cycle 5 adjuvant Xeloda 04/16/2014, Xeloda dose reduced to 1000 mg in the a.m. and 500 mg in the p.m.   Cycle 6 adjuvant Xeloda 05/07/2014.  Cycle 7 adjuvant Xeloda 05/28/2014.  Cycle 8 adjuvant Xeloda 06/18/2014.  Restaging CTs on 07/14/2014 with no evidence of metastatic colon cancer  Colonoscopy 10/02/2014 with diverticulosis in the  sigmoid colon and in the descending colon. One 4 mm polyp in the sigmoid colon with complete resection. Polyp tissue not retrieved. Internal hemorrhoids. Next colonoscopy in 3 years for surveillance.  Restaging CTs 07/16/2015 with 2 new hypodense liver lesions concerning for metastases, no other evidence of disease progression  PET scan 07/28/2015 with a single hypermetabolic right liver lesion, no other evidence of metastatic disease  Ultrasound-guided biopsy of the right liver lesion 07/30/2015 confirmed metastatic colon cancer, MSS, tumor mutation burden 1, KRAS G12D, PIK3CA, PTEN  MRI abdomen 08/20/2015 with similar to slight decrease in size of 2 hepatic metastasis. No new liver lesions or extrahepatic metastatic disease identified.  Radiofrequency ablation of 2 liver lesions 09/25/2015  MRI abdomen 02/02/2016-coagulative necrosis at the 2 ablated liver tumor sites,with no enhancement to suggest residual or recurrent liver tumor. No new liver masses or other findings of metastatic disease in the abdomen.  MRI abdomen 08/10/2016 revealed stable ablation sites, no evidence of progressive metastatic disease  Restaging CT 02/08/2017-stable treated liver lesions, no evidence of progressive metastatic disease, stable borderline enlarged right inguinal node  CEA with stable mild elevation 02/08/2017, 03/01/2017, 03/29/2017,04/19/2017  CEA further elevated 08/21/2017  CTs 09/05/2017-possible local recurrence of tumor along the ablation site right hepatic lobe lesion. Cephalad to the ablation site approximately 1.9 x 2.1cmsoft tissue mass progressive compared to the prior exam.  Liver SBRT 10/17/2017, 10/19/2017, 10/23/2017, 10/25/2017, 10/27/2017  CT 02/19/2018-progressive ill-defined areas of low attenuation with enhancement surrounding the segment 6 ablation site, stable segment 8 ablation site enlargement of extracapsular lesion posteriorly, no new hepatic lesions, wall  thickening/edema at the  distal ileum  CT 05/22/2018-2 new lung nodules suspicious for metastases, stable segment 8 ablation zone, slight decrease in the size of delayed perfusion at the segment 6 ablation zone, posterior extracapsular lesion is slightly smaller  CT chest 07/13/2018-multiple lung nodules consistent with metastatic disease  Cycle 1 Xeloda 07/23/2018  Cycle 2 Xeloda 08/13/2018  Cycle 3 Xeloda 09/03/2018  Cycle 4 Xeloda 09/24/2018  Restaging chest CT 10/12/2018-scattered pulmonary nodules/metastases mildly improved.  Cycle 5 Xeloda 10/15/2018  Cycle 6 Xeloda 11/05/2018  Cycle 7 Xeloda 11/26/2018  Cycle 8 Xeloda 12/17/2018  Cycle 9 Xeloda 01/07/2019  CT chest 01/24/2019-stable pulmonary nodules. No new pulmonary nodules. Stable left hepatic lobe cysts and right hepatic lobe lesions. No new hepatic lesions. Stable right ninth and 10th rib lesions.  Xeloda daily dosing schedule beginning 01/28/2019 due to hand-foot syndrome  CT 05/27/2019- enlargement of lung metastases, similar right hepatic lobe lesion  CT 08/05/2019-enlargement of bilateral lung nodules, no new lesions, stable right hepatic lesion  Cycle 1 FOLFIRI 08/13/2019 2. Right ovary cystadenofibroma, status post a bilateral oophorectomy 10/22/2013 3. Remote history of cervical cancer. 4. Iron deficiency anemia-resolved. 5. Right calf deep vein thrombosis 10/24/2013-she completed 3 months of xarelto. 6. Indeterminate 12 mm hypermetabolic right pelvic density on the staging PET scan 10/04/2013-CT followup recommended per the GI tumor conference 11/13/2013. No longer visualized on a CT 07/14/2014. 7. Soft tissue implant overlying the posterior right liver on the CT 55/97/4163-AGT hypermetabolic on the PET scan 36/46/8032. 8. C. difficile colitis 03/05/2014. She completed a course of Flagyl, recurrent diarrhea beginning 03/21/2014, positive C. difficile toxin 03/26/2014-resumed Flagyl for planned 10 day course.  Vancomycin beginning  04/08/2014.  Taper complete 05/21/2014.  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 Xeloda dose/schedule adjusted beginning 01/28/2019.     Disposition: Cynthia [name] appears stable.  She has completed 1 cycle of FOLFIRI.  We reviewed the CBC from today.  She has mild to moderate neutropenia.  We are holding cycle 2 today and rescheduling for 1 week.  The irinotecan will be dose reduced with cycle 2.  We reviewed neutropenic precautions.  She understands to contact the office with fever, chills, other signs of infection.  She will return for cycle 2 FOLFIRI on 09/03/2019.  We will see her prior to cycle 3 on 09/17/2019.  She will contact the office in the interim as outlined above.  Plan reviewed with Dr. Benay Spice.    Ned Card ANP/GNP-BC   08/27/2019  11:10 AM

## 2019-08-27 NOTE — Telephone Encounter (Signed)
Gave avs and calendar ° °

## 2019-09-01 ENCOUNTER — Other Ambulatory Visit: Payer: Self-pay | Admitting: Oncology

## 2019-09-04 ENCOUNTER — Inpatient Hospital Stay: Payer: Medicare PPO

## 2019-09-04 ENCOUNTER — Other Ambulatory Visit: Payer: Self-pay

## 2019-09-04 VITALS — BP 156/79 | HR 57 | Temp 98.0°F | Resp 16

## 2019-09-04 DIAGNOSIS — C787 Secondary malignant neoplasm of liver and intrahepatic bile duct: Secondary | ICD-10-CM

## 2019-09-04 DIAGNOSIS — Z5111 Encounter for antineoplastic chemotherapy: Secondary | ICD-10-CM | POA: Diagnosis not present

## 2019-09-04 DIAGNOSIS — C189 Malignant neoplasm of colon, unspecified: Secondary | ICD-10-CM

## 2019-09-04 DIAGNOSIS — Z95828 Presence of other vascular implants and grafts: Secondary | ICD-10-CM

## 2019-09-04 LAB — CBC WITH DIFFERENTIAL (CANCER CENTER ONLY)
Abs Immature Granulocytes: 0.01 10*3/uL (ref 0.00–0.07)
Basophils Absolute: 0 10*3/uL (ref 0.0–0.1)
Basophils Relative: 1 %
Eosinophils Absolute: 0.2 10*3/uL (ref 0.0–0.5)
Eosinophils Relative: 5 %
HCT: 35.6 % — ABNORMAL LOW (ref 36.0–46.0)
Hemoglobin: 11.3 g/dL — ABNORMAL LOW (ref 12.0–15.0)
Immature Granulocytes: 0 %
Lymphocytes Relative: 31 %
Lymphs Abs: 1.3 10*3/uL (ref 0.7–4.0)
MCH: 30.2 pg (ref 26.0–34.0)
MCHC: 31.7 g/dL (ref 30.0–36.0)
MCV: 95.2 fL (ref 80.0–100.0)
Monocytes Absolute: 0.5 10*3/uL (ref 0.1–1.0)
Monocytes Relative: 12 %
Neutro Abs: 2.1 10*3/uL (ref 1.7–7.7)
Neutrophils Relative %: 51 %
Platelet Count: 205 10*3/uL (ref 150–400)
RBC: 3.74 MIL/uL — ABNORMAL LOW (ref 3.87–5.11)
RDW: 14.1 % (ref 11.5–15.5)
WBC Count: 4 10*3/uL (ref 4.0–10.5)
nRBC: 0 % (ref 0.0–0.2)

## 2019-09-04 LAB — CMP (CANCER CENTER ONLY)
ALT: 18 U/L (ref 0–44)
AST: 23 U/L (ref 15–41)
Albumin: 3.3 g/dL — ABNORMAL LOW (ref 3.5–5.0)
Alkaline Phosphatase: 67 U/L (ref 38–126)
Anion gap: 9 (ref 5–15)
BUN: 13 mg/dL (ref 8–23)
CO2: 26 mmol/L (ref 22–32)
Calcium: 8.5 mg/dL — ABNORMAL LOW (ref 8.9–10.3)
Chloride: 108 mmol/L (ref 98–111)
Creatinine: 0.8 mg/dL (ref 0.44–1.00)
GFR, Est AFR Am: >60 mL/min (ref >60)
GFR, Estimated: >60 mL/min (ref >60)
Glucose, Bld: 83 mg/dL (ref 70–99)
Potassium: 4.2 mmol/L (ref 3.5–5.1)
Sodium: 143 mmol/L (ref 135–145)
Total Bilirubin: 0.3 mg/dL (ref 0.3–1.2)
Total Protein: 6.3 g/dL — ABNORMAL LOW (ref 6.5–8.1)

## 2019-09-04 MED ORDER — ATROPINE SULFATE 1 MG/ML IJ SOLN: 0.5000 mg | Freq: Once | INTRAMUSCULAR | Status: DC | PRN

## 2019-09-04 MED ORDER — ATROPINE SULFATE 0.4 MG/ML IJ SOLN
0.4000 mg | Freq: Once | INTRAMUSCULAR | Status: AC | PRN
  Administered 2019-09-04: 13:00:00 0.4 mg via INTRAVENOUS

## 2019-09-04 MED ORDER — DEXAMETHASONE SODIUM PHOSPHATE 10 MG/ML IJ SOLN
10.0000 mg | Freq: Once | INTRAMUSCULAR | Status: AC
  Administered 2019-09-04: 12:00:00 10 mg via INTRAVENOUS

## 2019-09-04 MED ORDER — SODIUM CHLORIDE 0.9 % IV SOLN
Freq: Once | INTRAVENOUS | Status: AC
  Filled 2019-09-04: qty 250

## 2019-09-04 MED ORDER — SODIUM CHLORIDE 0.9 % IV SOLN
400.0000 mg/m2 | Freq: Once | INTRAVENOUS | Status: AC
  Administered 2019-09-04: 676 mg via INTRAVENOUS
  Filled 2019-09-04: qty 33.8

## 2019-09-04 MED ORDER — SODIUM CHLORIDE 0.9 % IV SOLN
135.0000 mg/m2 | Freq: Once | INTRAVENOUS | Status: AC
  Administered 2019-09-04: 13:00:00 220 mg via INTRAVENOUS
  Filled 2019-09-04: qty 11

## 2019-09-04 MED ORDER — SODIUM CHLORIDE 0.9 % IV SOLN
2000.0000 mg/m2 | INTRAVENOUS | Status: DC
  Administered 2019-09-04: 15:00:00 3400 mg via INTRAVENOUS
  Filled 2019-09-04: qty 68

## 2019-09-04 MED ORDER — PALONOSETRON HCL INJECTION 0.25 MG/5ML
INTRAVENOUS | Status: AC
  Filled 2019-09-04: qty 5

## 2019-09-04 MED ORDER — PALONOSETRON HCL INJECTION 0.25 MG/5ML
0.2500 mg | Freq: Once | INTRAVENOUS | Status: AC
  Administered 2019-09-04: 0.25 mg via INTRAVENOUS

## 2019-09-04 MED ORDER — SODIUM CHLORIDE 0.9% FLUSH
10.0000 mL | INTRAVENOUS | Status: DC | PRN
  Administered 2019-09-04: 10 mL
  Filled 2019-09-04: qty 10

## 2019-09-04 MED ORDER — DEXAMETHASONE SODIUM PHOSPHATE 10 MG/ML IJ SOLN
INTRAMUSCULAR | Status: AC
  Filled 2019-09-04: qty 1

## 2019-09-04 NOTE — Patient Instructions (Signed)
[facility] Discharge Instructions for Patients Receiving Chemotherapy  Today you received the following chemotherapy agents: Irinotecan/Leucovorin/Fluorouracil  To help prevent nausea and vomiting after your treatment, we encourage you to take your nausea medication as directed.   If you develop nausea and vomiting that is not controlled by your nausea medication, call the clinic.   BELOW ARE SYMPTOMS THAT SHOULD BE REPORTED IMMEDIATELY:  *FEVER GREATER THAN 100.5 F  *CHILLS WITH OR WITHOUT FEVER  NAUSEA AND VOMITING THAT IS NOT CONTROLLED WITH YOUR NAUSEA MEDICATION  *UNUSUAL SHORTNESS OF BREATH  *UNUSUAL BRUISING OR BLEEDING  TENDERNESS IN MOUTH AND THROAT WITH OR WITHOUT PRESENCE OF ULCERS  *URINARY PROBLEMS  *BOWEL PROBLEMS  UNUSUAL RASH Items with * indicate a potential emergency and should be followed up as soon as possible.  Feel free to call the clinic should you have any questions or concerns. The clinic phone number is [phone].  Please show the [facility] at check-in to the Emergency Department and triage nurse.  Irinotecan injection What is this medicine? IRINOTECAN (ir in oh TEE kan ) is a chemotherapy drug. It is used to treat colon and rectal cancer. This medicine may be used for other purposes; ask your health care provider or pharmacist if you have questions. COMMON BRAND NAME(S): Camptosar What should I tell my health care provider before I take this medicine? They need to know if you have any of these conditions:  dehydration  diarrhea  infection (especially a virus infection such as chickenpox, cold sores, or herpes)  liver disease  low blood counts, like low white cell, platelet, or red cell counts  low levels of calcium, magnesium, or potassium in the blood  recent or ongoing radiation therapy  an unusual or allergic reaction to irinotecan, other medicines, foods, dyes, or preservatives  pregnant or trying to  get pregnant  breast-feeding How should I use this medicine? This drug is given as an infusion into a vein. It is administered in a hospital or clinic by a specially trained health care professional. Talk to your pediatrician regarding the use of this medicine in children. Special care may be needed. Overdosage: If you think you have taken too much of this medicine contact a poison control center or emergency room at once. NOTE: This medicine is only for you. Do not share this medicine with others. What if I miss a dose? It is important not to miss your dose. Call your doctor or health care professional if you are unable to keep an appointment. What may interact with this medicine? This medicine may interact with the following medications:  antiviral medicines for HIV or AIDS  certain antibiotics like rifampin or rifabutin  certain medicines for fungal infections like itraconazole, ketoconazole, posaconazole, and voriconazole  certain medicines for seizures like carbamazepine, phenobarbital, phenotoin  clarithromycin  gemfibrozil  nefazodone  St. John's Wort This list may not describe all possible interactions. Give your health care provider a list of all the medicines, herbs, non-prescription drugs, or dietary supplements you use. Also tell them if you smoke, drink alcohol, or use illegal drugs. Some items may interact with your medicine. What should I watch for while using this medicine? Your condition will be monitored carefully while you are receiving this medicine. You will need important blood work done while you are taking this medicine. This drug may make you feel generally unwell. This is not uncommon, as chemotherapy can affect healthy cells as well as cancer cells. Report  any side effects. Continue your course of treatment even though you feel ill unless your doctor tells you to stop. In some cases, you may be given additional medicines to help with side effects. Follow all  directions for their use. You may get drowsy or dizzy. Do not drive, use machinery, or do anything that needs mental alertness until you know how this medicine affects you. Do not stand or sit up quickly, especially if you are an older patient. This reduces the risk of dizzy or fainting spells. Call your health care professional for advice if you get a fever, chills, or sore throat, or other symptoms of a cold or flu. Do not treat yourself. This medicine decreases your body's ability to fight infections. Try to avoid being around people who are sick. Avoid taking products that contain aspirin, acetaminophen, ibuprofen, naproxen, or ketoprofen unless instructed by your doctor. These medicines may hide a fever. This medicine may increase your risk to bruise or bleed. Call your doctor or health care professional if you notice any unusual bleeding. Be careful brushing and flossing your teeth or using a toothpick because you may get an infection or bleed more easily. If you have any dental work done, tell your dentist you are receiving this medicine. Do not become pregnant while taking this medicine or for 6 months after stopping it. Women should inform their health care professional if they wish to become pregnant or think they might be pregnant. Men should not father a child while taking this medicine and for 3 months after stopping it. There is potential for serious side effects to an unborn child. Talk to your health care professional for more information. Do not breast-feed an infant while taking this medicine or for 7 days after stopping it. This medicine has caused ovarian failure in some women. This medicine may make it more difficult to get pregnant. Talk to your health care professional if you are concerned about your fertility. This medicine has caused decreased sperm counts in some men. This may make it more difficult to father a child. Talk to your health care professional if you are concerned about  your fertility. What side effects may I notice from receiving this medicine? Side effects that you should report to your doctor or health care professional as soon as possible:  allergic reactions like skin rash, itching or hives, swelling of the face, lips, or tongue  chest pain  diarrhea  flushing, runny nose, sweating during infusion  low blood counts - this medicine may decrease the number of white blood cells, red blood cells and platelets. You may be at increased risk for infections and bleeding.  nausea, vomiting  pain, swelling, warmth in the leg  signs of decreased platelets or bleeding - bruising, pinpoint red spots on the skin, black, tarry stools, blood in the urine  signs of infection - fever or chills, cough, sore throat, pain or difficulty passing urine  signs of decreased red blood cells - unusually weak or tired, fainting spells, lightheadedness Side effects that usually do not require medical attention (report to your doctor or health care professional if they continue or are bothersome):  constipation  hair loss  headache  loss of appetite  mouth sores  stomach pain This list may not describe all possible side effects. Call your doctor for medical advice about side effects. You may report side effects to FDA at 1-800-FDA-1088. Where should I keep my medicine? This drug is given in a hospital or clinic  and will not be stored at home. NOTE: This sheet is a summary. It may not cover all possible information. If you have questions about this medicine, talk to your doctor, pharmacist, or health care provider.  2020 Elsevier/Gold Standard (2018-09-21 10:09:17)  Leucovorin injection What is this medicine? LEUCOVORIN (loo koe VOR in) is used to prevent or treat the harmful effects of some medicines. This medicine is used to treat anemia caused by a low amount of folic acid in the body. It is also used with 5-fluorouracil (5-FU) to treat colon cancer. This  medicine may be used for other purposes; ask your health care provider or pharmacist if you have questions. What should I tell my health care provider before I take this medicine? They need to know if you have any of these conditions:  anemia from low levels of vitamin B-12 in the blood  an unusual or allergic reaction to leucovorin, folic acid, other medicines, foods, dyes, or preservatives  pregnant or trying to get pregnant  breast-feeding How should I use this medicine? This medicine is for injection into a muscle or into a vein. It is given by a health care professional in a hospital or clinic setting. Talk to your pediatrician regarding the use of this medicine in children. Special care may be needed. Overdosage: If you think you have taken too much of this medicine contact a poison control center or emergency room at once. NOTE: This medicine is only for you. Do not share this medicine with others. What if I miss a dose? This does not apply. What may interact with this medicine?  capecitabine  fluorouracil  phenobarbital  phenytoin  primidone  trimethoprim-sulfamethoxazole This list may not describe all possible interactions. Give your health care provider a list of all the medicines, herbs, non-prescription drugs, or dietary supplements you use. Also tell them if you smoke, drink alcohol, or use illegal drugs. Some items may interact with your medicine. What should I watch for while using this medicine? Your condition will be monitored carefully while you are receiving this medicine. This medicine may increase the side effects of 5-fluorouracil, 5-FU. Tell your doctor or health care professional if you have diarrhea or mouth sores that do not get better or that get worse. What side effects may I notice from receiving this medicine? Side effects that you should report to your doctor or health care professional as soon as possible:  allergic reactions like skin rash,  itching or hives, swelling of the face, lips, or tongue  breathing problems  fever, infection  mouth sores  unusual bleeding or bruising  unusually weak or tired Side effects that usually do not require medical attention (report to your doctor or health care professional if they continue or are bothersome):  constipation or diarrhea  loss of appetite  nausea, vomiting This list may not describe all possible side effects. Call your doctor for medical advice about side effects. You may report side effects to FDA at 1-800-FDA-1088. Where should I keep my medicine? This drug is given in a hospital or clinic and will not be stored at home. NOTE: This sheet is a summary. It may not cover all possible information. If you have questions about this medicine, talk to your doctor, pharmacist, or health care provider.  2020 Elsevier/Gold Standard (2008-02-05 16:50:29)  Fluorouracil, 5-FU injection What is this medicine? FLUOROURACIL, 5-FU (flure oh YOOR a sil) is a chemotherapy drug. It slows the growth of cancer cells. This medicine is used to  treat many types of cancer like breast cancer, colon or rectal cancer, pancreatic cancer, and stomach cancer. This medicine may be used for other purposes; ask your health care provider or pharmacist if you have questions. COMMON BRAND NAME(S): Adrucil What should I tell my health care provider before I take this medicine? They need to know if you have any of these conditions:  blood disorders  dihydropyrimidine dehydrogenase (DPD) deficiency  infection (especially a virus infection such as chickenpox, cold sores, or herpes)  kidney disease  liver disease  malnourished, poor nutrition  recent or ongoing radiation therapy  an unusual or allergic reaction to fluorouracil, other chemotherapy, other medicines, foods, dyes, or preservatives  pregnant or trying to get pregnant  breast-feeding How should I use this medicine? This drug is  given as an infusion or injection into a vein. It is administered in a hospital or clinic by a specially trained health care professional. Talk to your pediatrician regarding the use of this medicine in children. Special care may be needed. Overdosage: If you think you have taken too much of this medicine contact a poison control center or emergency room at once. NOTE: This medicine is only for you. Do not share this medicine with others. What if I miss a dose? It is important not to miss your dose. Call your doctor or health care professional if you are unable to keep an appointment. What may interact with this medicine?  allopurinol  cimetidine  dapsone  digoxin  hydroxyurea  leucovorin  levamisole  medicines for seizures like ethotoin, fosphenytoin, phenytoin  medicines to increase blood counts like filgrastim, pegfilgrastim, sargramostim  medicines that treat or prevent blood clots like warfarin, enoxaparin, and dalteparin  methotrexate  metronidazole  pyrimethamine  some other chemotherapy drugs like busulfan, cisplatin, estramustine, vinblastine  trimethoprim  trimetrexate  vaccines Talk to your doctor or health care professional before taking any of these medicines:  acetaminophen  aspirin  ibuprofen  ketoprofen  naproxen This list may not describe all possible interactions. Give your health care provider a list of all the medicines, herbs, non-prescription drugs, or dietary supplements you use. Also tell them if you smoke, drink alcohol, or use illegal drugs. Some items may interact with your medicine. What should I watch for while using this medicine? Visit your doctor for checks on your progress. This drug may make you feel generally unwell. This is not uncommon, as chemotherapy can affect healthy cells as well as cancer cells. Report any side effects. Continue your course of treatment even though you feel ill unless your doctor tells you to stop. In  some cases, you may be given additional medicines to help with side effects. Follow all directions for their use. Call your doctor or health care professional for advice if you get a fever, chills or sore throat, or other symptoms of a cold or flu. Do not treat yourself. This drug decreases your body's ability to fight infections. Try to avoid being around people who are sick. This medicine may increase your risk to bruise or bleed. Call your doctor or health care professional if you notice any unusual bleeding. Be careful brushing and flossing your teeth or using a toothpick because you may get an infection or bleed more easily. If you have any dental work done, tell your dentist you are receiving this medicine. Avoid taking products that contain aspirin, acetaminophen, ibuprofen, naproxen, or ketoprofen unless instructed by your doctor. These medicines may hide a fever. Do not become pregnant  while taking this medicine. Women should inform their doctor if they wish to become pregnant or think they might be pregnant. There is a potential for serious side effects to an unborn child. Talk to your health care professional or pharmacist for more information. Do not breast-feed an infant while taking this medicine. Men should inform their doctor if they wish to father a child. This medicine may lower sperm counts. Do not treat diarrhea with over the counter products. Contact your doctor if you have diarrhea that lasts more than 2 days or if it is severe and watery. This medicine can make you more sensitive to the sun. Keep out of the sun. If you cannot avoid being in the sun, wear protective clothing and use sunscreen. Do not use sun lamps or tanning beds/booths. What side effects may I notice from receiving this medicine? Side effects that you should report to your doctor or health care professional as soon as possible:  allergic reactions like skin rash, itching or hives, swelling of the face, lips, or  tongue  low blood counts - this medicine may decrease the number of white blood cells, red blood cells and platelets. You may be at increased risk for infections and bleeding.  signs of infection - fever or chills, cough, sore throat, pain or difficulty passing urine  signs of decreased platelets or bleeding - bruising, pinpoint red spots on the skin, black, tarry stools, blood in the urine  signs of decreased red blood cells - unusually weak or tired, fainting spells, lightheadedness  breathing problems  changes in vision  chest pain  mouth sores  nausea and vomiting  pain, swelling, redness at site where injected  pain, tingling, numbness in the hands or feet  redness, swelling, or sores on hands or feet  stomach pain  unusual bleeding Side effects that usually do not require medical attention (report to your doctor or health care professional if they continue or are bothersome):  changes in finger or toe nails  diarrhea  dry or itchy skin  hair loss  headache  loss of appetite  sensitivity of eyes to the light  stomach upset  unusually teary eyes This list may not describe all possible side effects. Call your doctor for medical advice about side effects. You may report side effects to FDA at 1-800-FDA-1088. Where should I keep my medicine? This drug is given in a hospital or clinic and will not be stored at home. NOTE: This sheet is a summary. It may not cover all possible information. If you have questions about this medicine, talk to your doctor, pharmacist, or health care provider.  2020 Elsevier/Gold Standard (2007-12-05 13:53:16)

## 2019-09-06 ENCOUNTER — Other Ambulatory Visit: Payer: Self-pay

## 2019-09-06 ENCOUNTER — Inpatient Hospital Stay: Payer: Medicare PPO

## 2019-09-06 VITALS — BP 163/87 | HR 68 | Temp 98.0°F | Resp 18

## 2019-09-06 DIAGNOSIS — C787 Secondary malignant neoplasm of liver and intrahepatic bile duct: Secondary | ICD-10-CM

## 2019-09-06 DIAGNOSIS — C189 Malignant neoplasm of colon, unspecified: Secondary | ICD-10-CM

## 2019-09-06 DIAGNOSIS — Z5111 Encounter for antineoplastic chemotherapy: Secondary | ICD-10-CM | POA: Diagnosis not present

## 2019-09-06 MED ORDER — HEPARIN SOD (PORK) LOCK FLUSH 100 UNIT/ML IV SOLN
500.0000 [IU] | Freq: Once | INTRAVENOUS | Status: AC | PRN
  Administered 2019-09-06: 500 [IU]
  Filled 2019-09-06: qty 5

## 2019-09-06 MED ORDER — SODIUM CHLORIDE 0.9% FLUSH
10.0000 mL | INTRAVENOUS | Status: DC | PRN
  Administered 2019-09-06: 10 mL
  Filled 2019-09-06: qty 10

## 2019-09-10 ENCOUNTER — Ambulatory Visit: Payer: Medicare Other

## 2019-09-10 ENCOUNTER — Ambulatory Visit: Payer: Medicare Other | Admitting: Oncology

## 2019-09-10 ENCOUNTER — Other Ambulatory Visit: Payer: Medicare Other

## 2019-09-15 ENCOUNTER — Other Ambulatory Visit: Payer: Self-pay | Admitting: Oncology

## 2019-09-17 ENCOUNTER — Inpatient Hospital Stay: Payer: Medicare PPO

## 2019-09-17 ENCOUNTER — Other Ambulatory Visit: Payer: Self-pay

## 2019-09-17 ENCOUNTER — Inpatient Hospital Stay (HOSPITAL_BASED_OUTPATIENT_CLINIC_OR_DEPARTMENT_OTHER): Payer: Medicare PPO | Admitting: Nurse Practitioner

## 2019-09-17 ENCOUNTER — Encounter: Payer: Self-pay | Admitting: Nurse Practitioner

## 2019-09-17 ENCOUNTER — Inpatient Hospital Stay: Payer: Medicare PPO | Attending: Oncology

## 2019-09-17 VITALS — BP 149/84 | HR 62 | Temp 98.0°F | Resp 17 | Ht 61.0 in | Wt 138.9 lb

## 2019-09-17 DIAGNOSIS — C189 Malignant neoplasm of colon, unspecified: Secondary | ICD-10-CM

## 2019-09-17 DIAGNOSIS — Z5111 Encounter for antineoplastic chemotherapy: Secondary | ICD-10-CM | POA: Insufficient documentation

## 2019-09-17 DIAGNOSIS — C180 Malignant neoplasm of cecum: Secondary | ICD-10-CM | POA: Diagnosis present

## 2019-09-17 DIAGNOSIS — C787 Secondary malignant neoplasm of liver and intrahepatic bile duct: Secondary | ICD-10-CM

## 2019-09-17 DIAGNOSIS — Z95828 Presence of other vascular implants and grafts: Secondary | ICD-10-CM

## 2019-09-17 DIAGNOSIS — C7800 Secondary malignant neoplasm of unspecified lung: Secondary | ICD-10-CM | POA: Diagnosis not present

## 2019-09-17 LAB — CMP (CANCER CENTER ONLY)
ALT: 17 U/L (ref 0–44)
AST: 24 U/L (ref 15–41)
Albumin: 3.3 g/dL — ABNORMAL LOW (ref 3.5–5.0)
Alkaline Phosphatase: 75 U/L (ref 38–126)
Anion gap: 7 (ref 5–15)
BUN: 12 mg/dL (ref 8–23)
CO2: 26 mmol/L (ref 22–32)
Calcium: 8.4 mg/dL — ABNORMAL LOW (ref 8.9–10.3)
Chloride: 109 mmol/L (ref 98–111)
Creatinine: 0.85 mg/dL (ref 0.44–1.00)
GFR, Est AFR Am: >60 mL/min (ref >60)
GFR, Estimated: >60 mL/min (ref >60)
Glucose, Bld: 85 mg/dL (ref 70–99)
Potassium: 4.2 mmol/L (ref 3.5–5.1)
Sodium: 142 mmol/L (ref 135–145)
Total Bilirubin: 0.3 mg/dL (ref 0.3–1.2)
Total Protein: 6.3 g/dL — ABNORMAL LOW (ref 6.5–8.1)

## 2019-09-17 LAB — CBC WITH DIFFERENTIAL (CANCER CENTER ONLY)
Abs Immature Granulocytes: 0.01 10*3/uL (ref 0.00–0.07)
Basophils Absolute: 0 10*3/uL (ref 0.0–0.1)
Basophils Relative: 0 %
Eosinophils Absolute: 0.1 10*3/uL (ref 0.0–0.5)
Eosinophils Relative: 4 %
HCT: 34.2 % — ABNORMAL LOW (ref 36.0–46.0)
Hemoglobin: 10.9 g/dL — ABNORMAL LOW (ref 12.0–15.0)
Immature Granulocytes: 0 %
Lymphocytes Relative: 30 %
Lymphs Abs: 1 10*3/uL (ref 0.7–4.0)
MCH: 29.5 pg (ref 26.0–34.0)
MCHC: 31.9 g/dL (ref 30.0–36.0)
MCV: 92.7 fL (ref 80.0–100.0)
Monocytes Absolute: 0.3 10*3/uL (ref 0.1–1.0)
Monocytes Relative: 7 %
Neutro Abs: 2 10*3/uL (ref 1.7–7.7)
Neutrophils Relative %: 59 %
Platelet Count: 194 10*3/uL (ref 150–400)
RBC: 3.69 MIL/uL — ABNORMAL LOW (ref 3.87–5.11)
RDW: 14.5 % (ref 11.5–15.5)
WBC Count: 3.4 10*3/uL — ABNORMAL LOW (ref 4.0–10.5)
nRBC: 0 % (ref 0.0–0.2)

## 2019-09-17 LAB — CEA (IN HOUSE-CHCC): CEA (CHCC-In House): 1.95 ng/mL (ref 0.00–5.00)

## 2019-09-17 MED ORDER — ATROPINE SULFATE 1 MG/ML IJ SOLN
0.5000 mg | Freq: Once | INTRAMUSCULAR | Status: AC | PRN
  Administered 2019-09-17: 13:00:00 0.5 mg via INTRAVENOUS

## 2019-09-17 MED ORDER — SODIUM CHLORIDE 0.9 % IV SOLN
2000.0000 mg/m2 | INTRAVENOUS | Status: DC
  Administered 2019-09-17: 15:00:00 3400 mg via INTRAVENOUS
  Filled 2019-09-17: qty 68

## 2019-09-17 MED ORDER — ATROPINE SULFATE 1 MG/ML IJ SOLN
INTRAMUSCULAR | Status: AC
  Filled 2019-09-17: qty 1

## 2019-09-17 MED ORDER — PALONOSETRON HCL INJECTION 0.25 MG/5ML
INTRAVENOUS | Status: AC
  Filled 2019-09-17: qty 5

## 2019-09-17 MED ORDER — PALONOSETRON HCL INJECTION 0.25 MG/5ML
0.2500 mg | Freq: Once | INTRAVENOUS | Status: AC
  Administered 2019-09-17: 12:00:00 0.25 mg via INTRAVENOUS

## 2019-09-17 MED ORDER — DEXAMETHASONE SODIUM PHOSPHATE 10 MG/ML IJ SOLN
INTRAMUSCULAR | Status: AC
  Filled 2019-09-17: qty 1

## 2019-09-17 MED ORDER — SODIUM CHLORIDE 0.9 % IV SOLN
135.0000 mg/m2 | Freq: Once | INTRAVENOUS | Status: AC
  Administered 2019-09-17: 13:00:00 220 mg via INTRAVENOUS
  Filled 2019-09-17: qty 11

## 2019-09-17 MED ORDER — SODIUM CHLORIDE 0.9 % IV SOLN
400.0000 mg/m2 | Freq: Once | INTRAVENOUS | Status: AC
  Administered 2019-09-17: 13:00:00 676 mg via INTRAVENOUS
  Filled 2019-09-17: qty 33.8

## 2019-09-17 MED ORDER — SODIUM CHLORIDE 0.9 % IV SOLN
Freq: Once | INTRAVENOUS | Status: AC
  Filled 2019-09-17: qty 250

## 2019-09-17 MED ORDER — DEXAMETHASONE SODIUM PHOSPHATE 10 MG/ML IJ SOLN
10.0000 mg | Freq: Once | INTRAMUSCULAR | Status: AC
  Administered 2019-09-17: 12:00:00 10 mg via INTRAVENOUS

## 2019-09-17 MED ORDER — SODIUM CHLORIDE 0.9% FLUSH
10.0000 mL | INTRAVENOUS | Status: DC | PRN
  Administered 2019-09-17: 11:00:00 10 mL
  Filled 2019-09-17: qty 10

## 2019-09-17 NOTE — Progress Notes (Signed)
[facility] OFFICE PROGRESS NOTE   Diagnosis: Colon cancer  INTERVAL HISTORY:   Cynthia [name] returns as scheduled. She completed cycle 2 FOLFIRI 09/03/2019.  She denies nausea/vomiting.  No mouth sores.  She had 2 episodes of loose stools.  She took Imodium with good relief.  She notes hair loss.  She notes a small amount of blood when she blows her nose.  Main complaint is worsening fatigue.  She has mild dyspnea on exertion.  No cough or fever.  Objective:  Vital signs in last 24 hours:  Blood pressure (!) 149/84, pulse 62, temperature 98 F (36.7 C), temperature source Temporal, resp. rate 17, height _0  (1.549 m), weight 138 lb 14.4 oz (63 kg), SpO2 98 %.    HEENT: No thrush or ulcers. Resp: Lungs clear bilaterally. Cardio: Regular rate and rhythm. GI: Abdomen soft and nontender.  No hepatomegaly. Vascular: No leg edema.  Skin: Palms without erythema.  Mild induration at the right lateral chest wall drain site.   Lab Results:  Lab Results  Component Value Date   WBC 3.4 (L) 09/17/2019   HGB 10.9 (L) 09/17/2019   HCT 34.2 (L) 09/17/2019   MCV 92.7 09/17/2019   PLT 194 09/17/2019   NEUTROABS 2.0 09/17/2019    Imaging:  No results found.  Medications: I have reviewed the patient's current medications.  Assessment/Plan: 1. Moderately differentiated adenocarcinoma of the cecum, stage III (T4a, N1), status post a laparoscopic assisted right colectomy 10/22/2013  The tumor returned microsatellite stable with normal mismatch repair protein expression   Cycle 1 adjuvant Xeloda 12/25/2013.   Cycle 2 adjuvant Xeloda 01/15/2014.   Cycle 3 adjuvant Xeloda 02/05/2014.   Cycle 4 adjuvant Xeloda 03/18/2014, discontinued after 4 days secondary to diarrhea   Cycle 5 adjuvant Xeloda 04/16/2014, Xeloda dose reduced to 1000 mg in the a.m. and 500 mg in the p.m.   Cycle 6 adjuvant Xeloda 05/07/2014.  Cycle 7 adjuvant Xeloda 05/28/2014.  Cycle 8  adjuvant Xeloda 06/18/2014.  Restaging CTs on 07/14/2014 with no evidence of metastatic colon cancer  Colonoscopy 10/02/2014 with diverticulosis in the sigmoid colon and in the descending colon. One 4 mm polyp in the sigmoid colon with complete resection. Polyp tissue not retrieved. Internal hemorrhoids. Next colonoscopy in 3 years for surveillance.  Restaging CTs 07/16/2015 with 2 new hypodense liver lesions concerning for metastases, no other evidence of disease progression  PET scan 07/28/2015 with a single hypermetabolic right liver lesion, no other evidence of metastatic disease  Ultrasound-guided biopsy of the right liver lesion 07/30/2015 confirmed metastatic colon cancer, MSS, tumor mutation burden 1, KRAS G12D, PIK3CA, PTEN  MRI abdomen 08/20/2015 with similar to slight decrease in size of 2 hepatic metastasis. No new liver lesions or extrahepatic metastatic disease identified.  Radiofrequency ablation of 2 liver lesions 09/25/2015  MRI abdomen 02/02/2016-coagulative necrosis at the 2 ablated liver tumor sites,with no enhancement to suggest residual or recurrent liver tumor. No new liver masses or other findings of metastatic disease in the abdomen.  MRI abdomen 08/10/2016 revealed stable ablation sites, no evidence of progressive metastatic disease  Restaging CT 02/08/2017-stable treated liver lesions, no evidence of progressive metastatic disease, stable borderline enlarged right inguinal node  CEA with stable mild elevation 02/08/2017, 03/01/2017, 03/29/2017,04/19/2017  CEA further elevated 08/21/2017  CTs 09/05/2017-possible local recurrence of tumor along the ablation site right hepatic lobe lesion. Cephalad to the ablation site approximately 1.9 x 2.1cmsoft tissue mass progressive compared to the prior exam.  Liver  SBRT 10/17/2017, 10/19/2017, 10/23/2017, 10/25/2017, 10/27/2017  CT 02/19/2018-progressive ill-defined areas of low attenuation with enhancement surrounding the  segment 6 ablation site, stable segment 8 ablation site enlargement of extracapsular lesion posteriorly, no new hepatic lesions, wall thickening/edema at the distal ileum  CT 05/22/2018-2 new lung nodules suspicious for metastases, stable segment 8 ablation zone, slight decrease in the size of delayed perfusion at the segment 6 ablation zone, posterior extracapsular lesion is slightly smaller  CT chest 07/13/2018-multiple lung nodules consistent with metastatic disease  Cycle 1 Xeloda 07/23/2018  Cycle 2 Xeloda 08/13/2018  Cycle 3 Xeloda 09/03/2018  Cycle 4 Xeloda 09/24/2018  Restaging chest CT 10/12/2018-scattered pulmonary nodules/metastases mildly improved.  Cycle 5 Xeloda 10/15/2018  Cycle 6 Xeloda 11/05/2018  Cycle 7 Xeloda 11/26/2018  Cycle 8 Xeloda 12/17/2018  Cycle 9 Xeloda 01/07/2019  CT chest 01/24/2019-stable pulmonary nodules. No new pulmonary nodules. Stable left hepatic lobe cysts and right hepatic lobe lesions. No new hepatic lesions. Stable right ninth and 10th rib lesions.  Xeloda daily dosing schedule beginning 01/28/2019 due to hand-foot syndrome  CT 05/27/2019-enlargement of lung metastases, similar right hepatic lobe lesion  CT 08/05/2019-enlargement of bilateral lung nodules, no new lesions, stable right hepatic lesion  Cycle 1 FOLFIRI 08/13/2019  Cycle 2 FOLFIRI 09/04/2019, Irinotecan dose reduced due to neutropenia  Cycle 3 FOLFIRI 09/17/2019 2. Right ovary cystadenofibroma, status post a bilateral oophorectomy 10/22/2013 3. Remote history of cervical cancer. 4. Iron deficiency anemia-resolved. 5. Right calf deep vein thrombosis 10/24/2013-she completed 3 months of xarelto. 6. Indeterminate 12 mm hypermetabolic right pelvic density on the staging PET scan 10/04/2013-CT followup recommended per the GI tumor conference 11/13/2013. No longer visualized on a CT 07/14/2014. 7. Soft tissue implant overlying the posterior right liver on the CT 49/67/5916-BWG  hypermetabolic on the PET scan 66/59/9357. 8. C. difficile colitis 03/05/2014. She completed a course of Flagyl, recurrent diarrhea beginning 03/21/2014, positive C. difficile toxin 03/26/2014-resumed Flagyl for planned 10 day course.  Vancomycin beginning 04/08/2014.  Taper complete 05/21/2014.  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 Xeloda dose/schedule adjusted beginning 01/28/2019.  Disposition: Ms. Mantei appears stable.  She has completed 2 cycles of FOLFIRI.  Plan to proceed with cycle 3 today as scheduled.    She is tolerating chemotherapy without significant acute toxicity but has progressive fatigue/malaise which is likely related to the chemotherapy.  We decided to change the interval between treatments from every 2 weeks to every 3 weeks.  She agrees with this plan.  We reviewed the CBC from today.  Counts are adequate to proceed with treatment.  She will return for lab, follow-up, cycle 4 FOLFIRI in 3 weeks.  She will contact the office in the interim with any problems.  Plan reviewed with Dr. Benay Spice.    Ned Card ANP/GNP-BC   09/17/2019  12:09 PM

## 2019-09-17 NOTE — Patient Instructions (Signed)
[facility] Discharge Instructions for Patients Receiving Chemotherapy  Today you received the following chemotherapy agents: fluorouracil, irinotecan, and leucovorin.  To help prevent nausea and vomiting after your treatment, we encourage you to take your nausea medication as directed.   If you develop nausea and vomiting that is not controlled by your nausea medication, call the clinic.   BELOW ARE SYMPTOMS THAT SHOULD BE REPORTED IMMEDIATELY:  *FEVER GREATER THAN 100.5 F  *CHILLS WITH OR WITHOUT FEVER  NAUSEA AND VOMITING THAT IS NOT CONTROLLED WITH YOUR NAUSEA MEDICATION  *UNUSUAL SHORTNESS OF BREATH  *UNUSUAL BRUISING OR BLEEDING  TENDERNESS IN MOUTH AND THROAT WITH OR WITHOUT PRESENCE OF ULCERS  *URINARY PROBLEMS  *BOWEL PROBLEMS  UNUSUAL RASH Items with * indicate a potential emergency and should be followed up as soon as possible.  Feel free to call the clinic should you have any questions or concerns. The clinic phone number is [phone].  Please show the [facility] at check-in to the Emergency Department and triage nurse.

## 2019-09-19 ENCOUNTER — Telehealth: Payer: Self-pay | Admitting: Oncology

## 2019-09-19 ENCOUNTER — Inpatient Hospital Stay: Payer: Medicare PPO

## 2019-09-19 ENCOUNTER — Other Ambulatory Visit: Payer: Self-pay

## 2019-09-19 VITALS — BP 122/78 | HR 72 | Temp 98.2°F | Resp 18

## 2019-09-19 DIAGNOSIS — C189 Malignant neoplasm of colon, unspecified: Secondary | ICD-10-CM

## 2019-09-19 DIAGNOSIS — C787 Secondary malignant neoplasm of liver and intrahepatic bile duct: Secondary | ICD-10-CM

## 2019-09-19 MED ORDER — SODIUM CHLORIDE 0.9% FLUSH
10.0000 mL | INTRAVENOUS | Status: DC | PRN
  Filled 2019-09-19: qty 10

## 2019-09-19 MED ORDER — HEPARIN SOD (PORK) LOCK FLUSH 100 UNIT/ML IV SOLN
500.0000 [IU] | Freq: Once | INTRAVENOUS | Status: DC | PRN
  Filled 2019-09-19: qty 5

## 2019-09-19 NOTE — Telephone Encounter (Signed)
Scheduled per los. Called and left msg. mailed printout  

## 2019-10-06 ENCOUNTER — Other Ambulatory Visit: Payer: Self-pay | Admitting: Oncology

## 2019-10-08 ENCOUNTER — Inpatient Hospital Stay: Payer: Medicare PPO

## 2019-10-08 ENCOUNTER — Other Ambulatory Visit: Payer: Self-pay

## 2019-10-08 ENCOUNTER — Inpatient Hospital Stay: Payer: Medicare PPO | Admitting: Oncology

## 2019-10-08 VITALS — BP 153/81 | HR 64 | Temp 98.2°F | Resp 18 | Ht 61.0 in | Wt 139.0 lb

## 2019-10-08 DIAGNOSIS — C189 Malignant neoplasm of colon, unspecified: Secondary | ICD-10-CM

## 2019-10-08 DIAGNOSIS — Z5111 Encounter for antineoplastic chemotherapy: Secondary | ICD-10-CM | POA: Diagnosis not present

## 2019-10-08 DIAGNOSIS — C787 Secondary malignant neoplasm of liver and intrahepatic bile duct: Secondary | ICD-10-CM

## 2019-10-08 DIAGNOSIS — Z95828 Presence of other vascular implants and grafts: Secondary | ICD-10-CM

## 2019-10-08 LAB — CBC WITH DIFFERENTIAL (CANCER CENTER ONLY)
Abs Immature Granulocytes: 0.01 10*3/uL (ref 0.00–0.07)
Basophils Absolute: 0 10*3/uL (ref 0.0–0.1)
Basophils Relative: 1 %
Eosinophils Absolute: 0.2 10*3/uL (ref 0.0–0.5)
Eosinophils Relative: 4 %
HCT: 34.9 % — ABNORMAL LOW (ref 36.0–46.0)
Hemoglobin: 11.1 g/dL — ABNORMAL LOW (ref 12.0–15.0)
Immature Granulocytes: 0 %
Lymphocytes Relative: 30 %
Lymphs Abs: 1.1 10*3/uL (ref 0.7–4.0)
MCH: 29.6 pg (ref 26.0–34.0)
MCHC: 31.8 g/dL (ref 30.0–36.0)
MCV: 93.1 fL (ref 80.0–100.0)
Monocytes Absolute: 0.5 10*3/uL (ref 0.1–1.0)
Monocytes Relative: 15 %
Neutro Abs: 1.8 10*3/uL (ref 1.7–7.7)
Neutrophils Relative %: 50 %
Platelet Count: 193 10*3/uL (ref 150–400)
RBC: 3.75 MIL/uL — ABNORMAL LOW (ref 3.87–5.11)
RDW: 16.3 % — ABNORMAL HIGH (ref 11.5–15.5)
WBC Count: 3.6 10*3/uL — ABNORMAL LOW (ref 4.0–10.5)
nRBC: 0 % (ref 0.0–0.2)

## 2019-10-08 LAB — CMP (CANCER CENTER ONLY)
ALT: 19 U/L (ref 0–44)
AST: 24 U/L (ref 15–41)
Albumin: 3.4 g/dL — ABNORMAL LOW (ref 3.5–5.0)
Alkaline Phosphatase: 64 U/L (ref 38–126)
Anion gap: 6 (ref 5–15)
BUN: 14 mg/dL (ref 8–23)
CO2: 27 mmol/L (ref 22–32)
Calcium: 8.7 mg/dL — ABNORMAL LOW (ref 8.9–10.3)
Chloride: 109 mmol/L (ref 98–111)
Creatinine: 0.79 mg/dL (ref 0.44–1.00)
GFR, Est AFR Am: >60 mL/min (ref >60)
GFR, Estimated: >60 mL/min (ref >60)
Glucose, Bld: 79 mg/dL (ref 70–99)
Potassium: 4.3 mmol/L (ref 3.5–5.1)
Sodium: 142 mmol/L (ref 135–145)
Total Bilirubin: 0.3 mg/dL (ref 0.3–1.2)
Total Protein: 6.6 g/dL (ref 6.5–8.1)

## 2019-10-08 LAB — CEA (IN HOUSE-CHCC): CEA (CHCC-In House): 2.85 ng/mL (ref 0.00–5.00)

## 2019-10-08 MED ORDER — SODIUM CHLORIDE 0.9 % IV SOLN
200.0000 mg/m2 | Freq: Once | INTRAVENOUS | Status: AC
  Administered 2019-10-08: 338 mg via INTRAVENOUS
  Filled 2019-10-08: qty 16.9

## 2019-10-08 MED ORDER — SODIUM CHLORIDE 0.9% FLUSH
10.0000 mL | INTRAVENOUS | Status: DC | PRN
  Administered 2019-10-08: 10 mL
  Filled 2019-10-08: qty 10

## 2019-10-08 MED ORDER — ATROPINE SULFATE 1 MG/ML IJ SOLN
INTRAMUSCULAR | Status: AC
  Filled 2019-10-08: qty 1

## 2019-10-08 MED ORDER — SODIUM CHLORIDE 0.9 % IV SOLN
Freq: Once | INTRAVENOUS | Status: AC
  Filled 2019-10-08: qty 250

## 2019-10-08 MED ORDER — DEXAMETHASONE SODIUM PHOSPHATE 10 MG/ML IJ SOLN
10.0000 mg | Freq: Once | INTRAMUSCULAR | Status: AC
  Administered 2019-10-08: 10 mg via INTRAVENOUS

## 2019-10-08 MED ORDER — DEXAMETHASONE SODIUM PHOSPHATE 10 MG/ML IJ SOLN
INTRAMUSCULAR | Status: AC
  Filled 2019-10-08: qty 1

## 2019-10-08 MED ORDER — PALONOSETRON HCL INJECTION 0.25 MG/5ML
INTRAVENOUS | Status: AC
  Filled 2019-10-08: qty 5

## 2019-10-08 MED ORDER — PALONOSETRON HCL INJECTION 0.25 MG/5ML
0.2500 mg | Freq: Once | INTRAVENOUS | Status: AC
  Administered 2019-10-08: 0.25 mg via INTRAVENOUS

## 2019-10-08 MED ORDER — SODIUM CHLORIDE 0.9 % IV SOLN
100.0000 mg/m2 | Freq: Once | INTRAVENOUS | Status: AC
  Administered 2019-10-08: 160 mg via INTRAVENOUS
  Filled 2019-10-08: qty 8

## 2019-10-08 MED ORDER — ATROPINE SULFATE 1 MG/ML IJ SOLN
0.5000 mg | Freq: Once | INTRAMUSCULAR | Status: AC | PRN
  Administered 2019-10-08: 0.5 mg via INTRAVENOUS

## 2019-10-08 MED ORDER — SODIUM CHLORIDE 0.9 % IV SOLN
1600.0000 mg/m2 | INTRAVENOUS | Status: DC
  Administered 2019-10-08: 2700 mg via INTRAVENOUS
  Filled 2019-10-08: qty 54

## 2019-10-08 NOTE — Progress Notes (Signed)
[facility] OFFICE PROGRESS NOTE   Diagnosis: Colon cancer  INTERVAL HISTORY:   Cynthia [name] completed another cycle of FOLFIRI on 09/17/2019.  She reports 1 episode of nausea following chemotherapy.  Mild diarrhea, relieved with Imodium.  She has noted increased tingling in the legs.  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  Her chief complaint is malaise.  She has profound malaise for several weeks following chemotherapy.  She is able to go about her activities in the home and is capable of self-care. The area of thickening at the right chest wall is smaller. Objective:  Vital signs in last 24 hours:  Blood pressure (!) 153/81, pulse 64, temperature 98.2 F (36.8 C), resp. rate 18, height _0  (1.549 m), weight 139 lb (63 kg), SpO2 97 %.    HEENT: The mucous membranes are moist, no thrush or ulcers Resp: Lungs clear bilaterally Cardio: Regular rate and rhythm GI: No hepatomegaly, nontender Vascular: Trace lower leg edema bilaterally   Skin: Mild skin thickening at the right lateral chest wall-decreased  Portacath/PICC-without erythema  Lab Results:  Lab Results  Component Value Date   WBC 3.6 (L) 10/08/2019   HGB 11.1 (L) 10/08/2019   HCT 34.9 (L) 10/08/2019   MCV 93.1 10/08/2019   PLT 193 10/08/2019   NEUTROABS 1.8 10/08/2019    CMP  Lab Results  Component Value Date   NA 142 10/08/2019   K 4.3 10/08/2019   CL 109 10/08/2019   CO2 27 10/08/2019   GLUCOSE 79 10/08/2019   BUN 14 10/08/2019   CREATININE 0.79 10/08/2019   CALCIUM 8.7 (L) 10/08/2019   PROT 6.6 10/08/2019   ALBUMIN 3.4 (L) 10/08/2019   AST 24 10/08/2019   ALT 19 10/08/2019   ALKPHOS 64 10/08/2019   BILITOT 0.3 10/08/2019   GFRNONAA >60 10/08/2019   GFRAA >60 10/08/2019    Lab Results  Component Value Date   CEA1 1.95 09/17/2019     Medications: I have reviewed the patient's current medications.   Assessment/Plan: 1. Moderately differentiated  adenocarcinoma of the cecum, stage III (T4a, N1), status post a laparoscopic assisted right colectomy 10/22/2013  The tumor returned microsatellite stable with normal mismatch repair protein expression   Cycle 1 adjuvant Xeloda 12/25/2013.   Cycle 2 adjuvant Xeloda 01/15/2014.   Cycle 3 adjuvant Xeloda 02/05/2014.   Cycle 4 adjuvant Xeloda 03/18/2014, discontinued after 4 days secondary to diarrhea   Cycle 5 adjuvant Xeloda 04/16/2014, Xeloda dose reduced to 1000 mg in the a.m. and 500 mg in the p.m.   Cycle 6 adjuvant Xeloda 05/07/2014.  Cycle 7 adjuvant Xeloda 05/28/2014.  Cycle 8 adjuvant Xeloda 06/18/2014.  Restaging CTs on 07/14/2014 with no evidence of metastatic colon cancer  Colonoscopy 10/02/2014 with diverticulosis in the sigmoid colon and in the descending colon. One 4 mm polyp in the sigmoid colon with complete resection. Polyp tissue not retrieved. Internal hemorrhoids. Next colonoscopy in 3 years for surveillance.  Restaging CTs 07/16/2015 with 2 new hypodense liver lesions concerning for metastases, no other evidence of disease progression  PET scan 07/28/2015 with a single hypermetabolic right liver lesion, no other evidence of metastatic disease  Ultrasound-guided biopsy of the right liver lesion 07/30/2015 confirmed metastatic colon cancer, MSS, tumor mutation burden 1, KRAS G12D, PIK3CA, PTEN  MRI abdomen 08/20/2015 with similar to slight decrease in size of 2 hepatic metastasis. No new liver lesions or extrahepatic metastatic disease identified.  Radiofrequency ablation of 2 liver lesions  09/25/2015  MRI abdomen 02/02/2016-coagulative necrosis at the 2 ablated liver tumor sites,with no enhancement to suggest residual or recurrent liver tumor. No new liver masses or other findings of metastatic disease in the abdomen.  MRI abdomen 08/10/2016 revealed stable ablation sites, no evidence of progressive metastatic disease  Restaging CT 02/08/2017-stable  treated liver lesions, no evidence of progressive metastatic disease, stable borderline enlarged right inguinal node  CEA with stable mild elevation 02/08/2017, 03/01/2017, 03/29/2017,04/19/2017  CEA further elevated 08/21/2017  CTs 09/05/2017-possible local recurrence of tumor along the ablation site right hepatic lobe lesion. Cephalad to the ablation site approximately 1.9 x 2.1cmsoft tissue mass progressive compared to the prior exam.  Liver SBRT 10/17/2017, 10/19/2017, 10/23/2017, 10/25/2017, 10/27/2017  CT 02/19/2018-progressive ill-defined areas of low attenuation with enhancement surrounding the segment 6 ablation site, stable segment 8 ablation site enlargement of extracapsular lesion posteriorly, no new hepatic lesions, wall thickening/edema at the distal ileum  CT 05/22/2018-2 new lung nodules suspicious for metastases, stable segment 8 ablation zone, slight decrease in the size of delayed perfusion at the segment 6 ablation zone, posterior extracapsular lesion is slightly smaller  CT chest 07/13/2018-multiple lung nodules consistent with metastatic disease  Cycle 1 Xeloda 07/23/2018  Cycle 2 Xeloda 08/13/2018  Cycle 3 Xeloda 09/03/2018  Cycle 4 Xeloda 09/24/2018  Restaging chest CT 10/12/2018-scattered pulmonary nodules/metastases mildly improved.  Cycle 5 Xeloda 10/15/2018  Cycle 6 Xeloda 11/05/2018  Cycle 7 Xeloda 11/26/2018  Cycle 8 Xeloda 12/17/2018  Cycle 9 Xeloda 01/07/2019  CT chest 01/24/2019-stable pulmonary nodules. No new pulmonary nodules. Stable left hepatic lobe cysts and right hepatic lobe lesions. No new hepatic lesions. Stable right ninth and 10th rib lesions.  Xeloda daily dosing schedule beginning 01/28/2019 due to hand-foot syndrome  CT 05/27/2019-enlargement of lung metastases, similar right hepatic lobe lesion  CT 08/05/2019-enlargement of bilateral lung nodules, no new lesions, stable right hepatic lesion  Cycle 1 FOLFIRI 08/13/2019  Cycle 2 FOLFIRI  09/04/2019, Irinotecan dose reduced due to neutropenia  Cycle 3 FOLFIRI 09/17/2019  Cycle 4 FOLFIRI 10/08/2019, 5-FU and irinotecan dose reduced 2. Right ovary cystadenofibroma, status post a bilateral oophorectomy 10/22/2013 3. Remote history of cervical cancer. 4. Iron deficiency anemia-resolved. 5. Right calf deep vein thrombosis 10/24/2013-she completed 3 months of xarelto. 6. Indeterminate 12 mm hypermetabolic right pelvic density on the staging PET scan 10/04/2013-CT followup recommended per the GI tumor conference 11/13/2013. No longer visualized on a CT 07/14/2014. 7. Soft tissue implant overlying the posterior right liver on the CT 84/66/5993-TTS hypermetabolic on the PET scan 17/79/3903. 8. C. difficile colitis 03/05/2014. She completed a course of Flagyl, recurrent diarrhea beginning 03/21/2014, positive C. difficile toxin 03/26/2014-resumed Flagyl for planned 10 day course.  Vancomycin beginning 04/08/2014.  Taper complete 05/21/2014.  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 Xeloda dose/schedule adjusted beginning 01/28/2019.    Disposition: Cynthia [name] appears stable.  She has profound malaise following each cycle of chemotherapy.  She does not wish to continue chemotherapy beyond today's cycle.  The plan is to complete cycle 4 FOLFIRI today.  She will then undergo a restaging chest CT.  We will dose reduce the 5-FU and irinotecan with this cycle.  Cynthia [name] will return for an office visit after the restaging CT.  The neutrophil count is borderline low today.  She will call for a fever or symptoms of an infection.  [name], MD  10/08/2019  11:50 AM

## 2019-10-08 NOTE — Patient Instructions (Signed)

## 2019-10-08 NOTE — Patient Instructions (Signed)
[facility] Discharge Instructions for Patients Receiving Chemotherapy  Today you received the following chemotherapy agents :  Irinotecan, Leucovorin, Fluorouracil.  To help prevent nausea and vomiting after your treatment, we encourage you to take your nausea medication as prescribed.   If you develop nausea and vomiting that is not controlled by your nausea medication, call the clinic.   BELOW ARE SYMPTOMS THAT SHOULD BE REPORTED IMMEDIATELY:  *FEVER GREATER THAN 100.5 F  *CHILLS WITH OR WITHOUT FEVER  NAUSEA AND VOMITING THAT IS NOT CONTROLLED WITH YOUR NAUSEA MEDICATION  *UNUSUAL SHORTNESS OF BREATH  *UNUSUAL BRUISING OR BLEEDING  TENDERNESS IN MOUTH AND THROAT WITH OR WITHOUT PRESENCE OF ULCERS  *URINARY PROBLEMS  *BOWEL PROBLEMS  UNUSUAL RASH Items with * indicate a potential emergency and should be followed up as soon as possible.  Feel free to call the clinic should you have any questions or concerns. The clinic phone number is [phone].  Please show the CHEMO ALERT CARD at check-in to the Emergency Department and triage nurse.   

## 2019-10-08 NOTE — Progress Notes (Signed)
Nutrition Assessment   Reason for Assessment:  Patient identified on Malnutrition screening report for weight loss   ASSESSMENT:  84 year old female with colon cancer.   Patient followed by Dr. Benay Spice and receiving folfiri.    Met with patient during infusion and introduced self and service at [facility].  Patient reports that she gets full quickly due to past radiation treatments. Does not want chemotherapy to kill her but wants to enjoy her life, children and grandchildren.  Reports that she tries to eat as much as she can.  Drinks soy milk, mainly eats chicken and fish.  Reports that he daughter sees that she eats healthy.  Drinks boosts shakes has been avoiding the chocolate one due to her understanding of she needed to stay away from caffiene.  Also not drinking her coffee in the am that she enjoys.     Medications: reviewed   Labs: reviewed   Anthropometrics:   Height: 61 inches Weight: 139 lb today Noted weight 145 lb on 12/29 BMI: 26  4% weight loss in the last 2 months  Estimated Energy Needs  Kcals: 1575-1800 Protein: 79-90 g Fluid: > 1.5 L   NUTRITION DIAGNOSIS: Inadequate oral intake related to cancer related treatment side effects as evidenced by 4% weight loss, decreased appetite   INTERVENTION:  Reviewed with patient that she can still enjoy cup of coffee in the am and chocolate as well as caffiene free beverages.  Encouraged small frequent snacks to help with fullness.  Encouraged patient to continue drinking oral nutrition supplements Provided contact information and patient to reach out if needed    Next Visit: no follow-up, patient to reach out if needed  Akita Maxim B. Zenia Resides, [place], [place] Registered Dietitian [phone] (pager)

## 2019-10-09 ENCOUNTER — Telehealth: Payer: Self-pay | Admitting: Oncology

## 2019-10-09 NOTE — Telephone Encounter (Signed)
Scheduled per los. Called and left msg. Mailed printout  °

## 2019-10-10 ENCOUNTER — Inpatient Hospital Stay: Payer: Medicare PPO

## 2019-10-10 ENCOUNTER — Other Ambulatory Visit: Payer: Self-pay

## 2019-10-10 VITALS — BP 162/79 | HR 67 | Temp 97.8°F | Resp 18

## 2019-10-10 DIAGNOSIS — Z5111 Encounter for antineoplastic chemotherapy: Secondary | ICD-10-CM | POA: Diagnosis not present

## 2019-10-10 DIAGNOSIS — C189 Malignant neoplasm of colon, unspecified: Secondary | ICD-10-CM

## 2019-10-10 MED ORDER — SODIUM CHLORIDE 0.9% FLUSH
10.0000 mL | INTRAVENOUS | Status: DC | PRN
  Administered 2019-10-10: 13:00:00 10 mL
  Filled 2019-10-10: qty 10

## 2019-10-10 MED ORDER — HEPARIN SOD (PORK) LOCK FLUSH 100 UNIT/ML IV SOLN
500.0000 [IU] | Freq: Once | INTRAVENOUS | Status: AC | PRN
  Administered 2019-10-10: 500 [IU]
  Filled 2019-10-10: qty 5

## 2019-10-28 ENCOUNTER — Other Ambulatory Visit: Payer: Self-pay

## 2019-10-28 ENCOUNTER — Encounter (HOSPITAL_COMMUNITY): Payer: Self-pay

## 2019-10-28 ENCOUNTER — Ambulatory Visit (HOSPITAL_COMMUNITY)
Admission: RE | Admit: 2019-10-28 | Discharge: 2019-10-28 | Disposition: A | Discharge status: Home / Self Care | Payer: Medicare PPO | Source: Ambulatory Visit | Attending: Oncology | Admitting: Oncology

## 2019-10-28 DIAGNOSIS — C189 Malignant neoplasm of colon, unspecified: Secondary | ICD-10-CM | POA: Insufficient documentation

## 2019-10-28 DIAGNOSIS — C787 Secondary malignant neoplasm of liver and intrahepatic bile duct: Secondary | ICD-10-CM | POA: Diagnosis present

## 2019-10-29 ENCOUNTER — Inpatient Hospital Stay: Payer: Medicare PPO | Attending: Oncology | Admitting: Nurse Practitioner

## 2019-10-29 ENCOUNTER — Inpatient Hospital Stay: Payer: Medicare PPO

## 2019-10-29 ENCOUNTER — Other Ambulatory Visit: Payer: Self-pay

## 2019-10-29 ENCOUNTER — Encounter: Payer: Self-pay | Admitting: Nurse Practitioner

## 2019-10-29 VITALS — BP 169/98 | HR 69 | Temp 97.9°F | Resp 18 | Ht 61.0 in | Wt 141.5 lb

## 2019-10-29 DIAGNOSIS — Z8541 Personal history of malignant neoplasm of cervix uteri: Secondary | ICD-10-CM | POA: Diagnosis not present

## 2019-10-29 DIAGNOSIS — C7802 Secondary malignant neoplasm of left lung: Secondary | ICD-10-CM | POA: Diagnosis not present

## 2019-10-29 DIAGNOSIS — C7801 Secondary malignant neoplasm of right lung: Secondary | ICD-10-CM | POA: Diagnosis not present

## 2019-10-29 DIAGNOSIS — C189 Malignant neoplasm of colon, unspecified: Secondary | ICD-10-CM

## 2019-10-29 DIAGNOSIS — C787 Secondary malignant neoplasm of liver and intrahepatic bile duct: Secondary | ICD-10-CM | POA: Insufficient documentation

## 2019-10-29 DIAGNOSIS — C180 Malignant neoplasm of cecum: Secondary | ICD-10-CM | POA: Insufficient documentation

## 2019-10-29 MED ORDER — HEPARIN SOD (PORK) LOCK FLUSH 100 UNIT/ML IV SOLN
500.0000 [IU] | Freq: Once | INTRAVENOUS | Status: AC | PRN
  Filled 2019-10-29: qty 5

## 2019-10-29 MED ORDER — SODIUM CHLORIDE 0.9% FLUSH
10.0000 mL | INTRAVENOUS | Status: AC | PRN
  Filled 2019-10-29: qty 10

## 2019-10-29 NOTE — Progress Notes (Signed)
[facility] OFFICE PROGRESS NOTE   Diagnosis: Colon cancer  INTERVAL HISTORY:   Ms. Remmel returns as scheduled.  She completed cycle 4 FOLFIRI 10/08/2019.  5-FU and irinotecan were dose reduced.  Her main complaint is fatigue.  She does have nausea and diarrhea after the chemotherapy.  She reports her quality of life is poor.  Objective:  Vital signs in last 24 hours:  Blood pressure (!) 169/98, pulse 69, temperature 97.9 F (36.6 C), temperature source Temporal, resp. rate 18, height 5' 1"  (1.549 m), weight 141 lb 8 oz (64.2 kg), SpO2 97 %.    HEENT: No thrush or ulcers. GI: Abdomen soft and nontender.  No hepatomegaly. Vascular: No leg edema. Neuro: Alert and oriented. Skin: Very mild erythema/skin thickening right lateral chest wall. Port-A-Cath without erythema.  Lab Results:  Lab Results  Component Value Date   WBC 3.6 (L) 10/08/2019   HGB 11.1 (L) 10/08/2019   HCT 34.9 (L) 10/08/2019   MCV 93.1 10/08/2019   PLT 193 10/08/2019   NEUTROABS 1.8 10/08/2019    Imaging:  CT CHEST WO CONTRAST  Result Date: 10/28/2019 CLINICAL DATA:  Follow-up pulmonary nodules, metastatic colon cancer EXAM: CT CHEST WITHOUT CONTRAST TECHNIQUE: Multidetector CT imaging of the chest was performed following the standard protocol without IV contrast. COMPARISON:  08/05/2019 FINDINGS: Cardiovascular: The heart is normal in size. No pericardial effusion. No evidence of thoracic aortic aneurysm. Atherosclerotic calcifications of the aortic arch. Three vessel coronary atherosclerosis. Right chest port terminates at the cavoatrial junction. Mediastinum/Nodes: No suspicious mediastinal lymphadenopathy. Visualized thyroid is unremarkable. Lungs/Pleura: Mild biapical pleural-parenchymal scarring. Bilateral pulmonary metastases, including: --9 mm subpleural nodule in the posterior right upper lobe (series 7/image 47), previously 10 mm --7 mm anterior right upper lobe nodule (series  7/image 56), unchanged --10 mm anterior left upper lobe nodule (series 7/image 91), previously 11 mm --25 x 19 mm posterior right middle lobe nodule (series 7/image 94), previously 27 x 20 mm Overall, these are minimally improved. No focal consolidation. No pleural effusion or pneumothorax. Upper Abdomen: Visualized upper abdomen is notable for dominant left hepatic lobe cysts measuring up to 6.7 cm and a stable 2.5 cm posterior right hepatic lobe lesions status post ablation. Musculoskeletal: Mild degenerative changes of the visualized thoracolumbar spine. IMPRESSION: Bilateral pulmonary metastases, minimally improved. Stable ablation zone in the posterior right hepatic lobe. Aortic Atherosclerosis (ICD10-I70.0). Electronically Signed   By: Julian Hy M.D.   On: 10/28/2019 12:40    Medications: I have reviewed the patient's current medications.  Assessment/Plan: 1. Moderately differentiated adenocarcinoma of the cecum, stage III (T4a, N1), status post a laparoscopic assisted right colectomy 10/22/2013  The tumor returned microsatellite stable with normal mismatch repair protein expression   Cycle 1 adjuvant Xeloda 12/25/2013.   Cycle 2 adjuvant Xeloda 01/15/2014.   Cycle 3 adjuvant Xeloda 02/05/2014.   Cycle 4 adjuvant Xeloda 03/18/2014, discontinued after 4 days secondary to diarrhea   Cycle 5 adjuvant Xeloda 04/16/2014, Xeloda dose reduced to 1000 mg in the a.m. and 500 mg in the p.m.   Cycle 6 adjuvant Xeloda 05/07/2014.  Cycle 7 adjuvant Xeloda 05/28/2014.  Cycle 8 adjuvant Xeloda 06/18/2014.  Restaging CTs on 07/14/2014 with no evidence of metastatic colon cancer  Colonoscopy 10/02/2014 with diverticulosis in the sigmoid colon and in the descending colon. One 4 mm polyp in the sigmoid colon with complete resection. Polyp tissue not retrieved. Internal hemorrhoids. Next colonoscopy in 3 years for surveillance.  Restaging CTs 07/16/2015 with 2  new hypodense liver  lesions concerning for metastases, no other evidence of disease progression  PET scan 07/28/2015 with a single hypermetabolic right liver lesion, no other evidence of metastatic disease  Ultrasound-guided biopsy of the right liver lesion 07/30/2015 confirmed metastatic colon cancer, MSS, tumor mutation burden 1, KRAS G12D, PIK3CA, PTEN  MRI abdomen 08/20/2015 with similar to slight decrease in size of 2 hepatic metastasis. No new liver lesions or extrahepatic metastatic disease identified.  Radiofrequency ablation of 2 liver lesions 09/25/2015  MRI abdomen 02/02/2016-coagulative necrosis at the 2 ablated liver tumor sites,with no enhancement to suggest residual or recurrent liver tumor. No new liver masses or other findings of metastatic disease in the abdomen.  MRI abdomen 08/10/2016 revealed stable ablation sites, no evidence of progressive metastatic disease  Restaging CT 02/08/2017-stable treated liver lesions, no evidence of progressive metastatic disease, stable borderline enlarged right inguinal node  CEA with stable mild elevation 02/08/2017, 03/01/2017, 03/29/2017,04/19/2017  CEA further elevated 08/21/2017  CTs 09/05/2017-possible local recurrence of tumor along the ablation site right hepatic lobe lesion. Cephalad to the ablation site approximately 1.9 x 2.1cmsoft tissue mass progressive compared to the prior exam.  Liver SBRT 10/17/2017, 10/19/2017, 10/23/2017, 10/25/2017, 10/27/2017  CT 02/19/2018-progressive ill-defined areas of low attenuation with enhancement surrounding the segment 6 ablation site, stable segment 8 ablation site enlargement of extracapsular lesion posteriorly, no new hepatic lesions, wall thickening/edema at the distal ileum  CT 05/22/2018-2 new lung nodules suspicious for metastases, stable segment 8 ablation zone, slight decrease in the size of delayed perfusion at the segment 6 ablation zone, posterior extracapsular lesion is slightly smaller  CT chest  07/13/2018-multiple lung nodules consistent with metastatic disease  Cycle 1 Xeloda 07/23/2018  Cycle 2 Xeloda 08/13/2018  Cycle 3 Xeloda 09/03/2018  Cycle 4 Xeloda 09/24/2018  Restaging chest CT 10/12/2018-scattered pulmonary nodules/metastases mildly improved.  Cycle 5 Xeloda 10/15/2018  Cycle 6 Xeloda 11/05/2018  Cycle 7 Xeloda 11/26/2018  Cycle 8 Xeloda 12/17/2018  Cycle 9 Xeloda 01/07/2019  CT chest 01/24/2019-stable pulmonary nodules. No new pulmonary nodules. Stable left hepatic lobe cysts and right hepatic lobe lesions. No new hepatic lesions. Stable right ninth and 10th rib lesions.  Xeloda daily dosing schedule beginning 01/28/2019 due to hand-foot syndrome  CT 05/27/2019-enlargement of lung metastases, similar right hepatic lobe lesion  CT 08/05/2019-enlargement of bilateral lung nodules, no new lesions, stable right hepatic lesion  Cycle 1 FOLFIRI 08/13/2019  Cycle 2 FOLFIRI 09/04/2019, Irinotecan dose reduced due to neutropenia  Cycle 3 FOLFIRI 09/17/2019  Cycle 4 FOLFIRI 10/08/2019, 5-FU and irinotecan dose reduced  CT chest 10/28/2019-bilateral pulmonary metastases with minimal improvement.  Stable ablation zone in the posterior right hepatic lobe. 2. Right ovary cystadenofibroma, status post a bilateral oophorectomy 10/22/2013 3. Remote history of cervical cancer. 4. Iron deficiency anemia-resolved. 5. Right calf deep vein thrombosis 10/24/2013-she completed 3 months of xarelto. 6. Indeterminate 12 mm hypermetabolic right pelvic density on the staging PET scan 10/04/2013-CT followup recommended per the GI tumor conference 11/13/2013. No longer visualized on a CT 07/14/2014. 7. Soft tissue implant overlying the posterior right liver on the CT 92/42/6834-HDQ hypermetabolic on the PET scan 22/29/7989. 8. C. difficile colitis 03/05/2014. She completed a course of Flagyl, recurrent diarrhea beginning 03/21/2014, positive C. difficile toxin 03/26/2014-resumed Flagyl for  planned 10 day course.  Vancomycin beginning 04/08/2014.  Taper complete 05/21/2014.  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 Xeloda dose/schedule adjusted beginning 01/28/2019.   Disposition: Ms. Brix appears stable.  She has completed 4 cycles  of FOLFIRI.  Restaging chest CT shows some improvement in the lung nodules, no new or progressive disease.  She does not wish to continue chemotherapy at this time.  She will begin a treatment break.  She will return for a CEA, Port-A-Cath flush and follow-up visit in 5 weeks.  Patient seen with Dr. Benay Spice.    Ned Card ANP/GNP-BC   10/29/2019  3:22 PM This was a shared visit with Ned Card.  We reviewed the CT findings with Ms.Cueva and her daughter.  The CT reveals mild improvement in the lung nodules and no evidence of disease progression.  She would like to discontinue chemotherapy.  Julieanne Manson, MD

## 2019-11-01 ENCOUNTER — Telehealth: Payer: Self-pay | Admitting: Oncology

## 2019-11-01 NOTE — Telephone Encounter (Signed)
Scheduled per los. Called and spoke with patient. Confirmed appt 

## 2019-12-03 ENCOUNTER — Other Ambulatory Visit: Payer: Self-pay

## 2019-12-03 ENCOUNTER — Inpatient Hospital Stay: Payer: Medicare PPO

## 2019-12-03 ENCOUNTER — Telehealth: Payer: Self-pay

## 2019-12-03 ENCOUNTER — Inpatient Hospital Stay: Payer: Medicare PPO | Attending: Oncology | Admitting: Oncology

## 2019-12-03 VITALS — BP 153/75 | HR 63 | Temp 98.3°F | Resp 20 | Ht 61.0 in | Wt 139.7 lb

## 2019-12-03 DIAGNOSIS — C189 Malignant neoplasm of colon, unspecified: Secondary | ICD-10-CM | POA: Diagnosis not present

## 2019-12-03 DIAGNOSIS — Z8541 Personal history of malignant neoplasm of cervix uteri: Secondary | ICD-10-CM | POA: Diagnosis not present

## 2019-12-03 DIAGNOSIS — C7800 Secondary malignant neoplasm of unspecified lung: Secondary | ICD-10-CM | POA: Insufficient documentation

## 2019-12-03 DIAGNOSIS — C787 Secondary malignant neoplasm of liver and intrahepatic bile duct: Secondary | ICD-10-CM | POA: Insufficient documentation

## 2019-12-03 DIAGNOSIS — Z86718 Personal history of other venous thrombosis and embolism: Secondary | ICD-10-CM | POA: Diagnosis not present

## 2019-12-03 DIAGNOSIS — C180 Malignant neoplasm of cecum: Secondary | ICD-10-CM | POA: Insufficient documentation

## 2019-12-03 DIAGNOSIS — Z95828 Presence of other vascular implants and grafts: Secondary | ICD-10-CM

## 2019-12-03 LAB — CEA (IN HOUSE-CHCC): CEA (CHCC-In House): 3.64 ng/mL (ref 0.00–5.00)

## 2019-12-03 MED ORDER — HEPARIN SOD (PORK) LOCK FLUSH 100 UNIT/ML IV SOLN
500.0000 [IU] | Freq: Once | INTRAVENOUS | Status: DC | PRN
  Filled 2019-12-03: qty 5

## 2019-12-03 MED ORDER — SODIUM CHLORIDE 0.9% FLUSH
10.0000 mL | INTRAVENOUS | Status: DC | PRN
  Filled 2019-12-03: qty 10

## 2019-12-03 NOTE — Telephone Encounter (Signed)
TC to pt per Dr Benay Spice to let her know that her CEA is normal. Patient verbalized understanding.

## 2019-12-03 NOTE — Progress Notes (Signed)
[place] OFFICE PROGRESS NOTE   Diagnosis: Colon cancer  INTERVAL HISTORY:   Ms. Frank returns as scheduled.  She reports an improved energy level since discontinuing chemotherapy.  No pain at the right chest wall.  No new complaint.  Objective:  Vital signs in last 24 hours:  Blood pressure (!) 153/75, pulse 63, temperature 98.3 F (36.8 C), temperature source Temporal, resp. rate 20, height 5' 1"  (1.549 m), weight 139 lb 11.2 oz (63.4 kg), SpO2 100 %.    GI: No hepatomegaly, nontender Vascular: No leg edema  Skin: 1-2 cm puckered area at the right lateral chest wall with mild surrounding induration, no discrete mass, nontender  Portacath/PICC-without erythema  Lab Results:  Lab Results  Component Value Date   WBC 3.6 (L) 10/08/2019   HGB 11.1 (L) 10/08/2019   HCT 34.9 (L) 10/08/2019   MCV 93.1 10/08/2019   PLT 193 10/08/2019   NEUTROABS 1.8 10/08/2019    CMP  Lab Results  Component Value Date   NA 142 10/08/2019   K 4.3 10/08/2019   CL 109 10/08/2019   CO2 27 10/08/2019   GLUCOSE 79 10/08/2019   BUN 14 10/08/2019   CREATININE 0.79 10/08/2019   CALCIUM 8.7 (L) 10/08/2019   PROT 6.6 10/08/2019   ALBUMIN 3.4 (L) 10/08/2019   AST 24 10/08/2019   ALT 19 10/08/2019   ALKPHOS 64 10/08/2019   BILITOT 0.3 10/08/2019   GFRNONAA >60 10/08/2019   GFRAA >60 10/08/2019    Lab Results  Component Value Date   CEA1 2.85 10/08/2019     Medications: I have reviewed the patient's current medications.   Assessment/Plan: 1. Moderately differentiated adenocarcinoma of the cecum, stage III (T4a, N1), status post a laparoscopic assisted right colectomy 10/22/2013  The tumor returned microsatellite stable with normal mismatch repair protein expression   Cycle 1 adjuvant Xeloda 12/25/2013.   Cycle 2 adjuvant Xeloda 01/15/2014.   Cycle 3 adjuvant Xeloda 02/05/2014.   Cycle 4 adjuvant Xeloda 03/18/2014, discontinued after 4 days secondary  to diarrhea   Cycle 5 adjuvant Xeloda 04/16/2014, Xeloda dose reduced to 1000 mg in the a.m. and 500 mg in the p.m.   Cycle 6 adjuvant Xeloda 05/07/2014.  Cycle 7 adjuvant Xeloda 05/28/2014.  Cycle 8 adjuvant Xeloda 06/18/2014.  Restaging CTs on 07/14/2014 with no evidence of metastatic colon cancer  Colonoscopy 10/02/2014 with diverticulosis in the sigmoid colon and in the descending colon. One 4 mm polyp in the sigmoid colon with complete resection. Polyp tissue not retrieved. Internal hemorrhoids. Next colonoscopy in 3 years for surveillance.  Restaging CTs 07/16/2015 with 2 new hypodense liver lesions concerning for metastases, no other evidence of disease progression  PET scan 07/28/2015 with a single hypermetabolic right liver lesion, no other evidence of metastatic disease  Ultrasound-guided biopsy of the right liver lesion 07/30/2015 confirmed metastatic colon cancer, MSS, tumor mutation burden 1, KRAS G12D, PIK3CA, PTEN  MRI abdomen 08/20/2015 with similar to slight decrease in size of 2 hepatic metastasis. No new liver lesions or extrahepatic metastatic disease identified.  Radiofrequency ablation of 2 liver lesions 09/25/2015  MRI abdomen 02/02/2016-coagulative necrosis at the 2 ablated liver tumor sites,with no enhancement to suggest residual or recurrent liver tumor. No new liver masses or other findings of metastatic disease in the abdomen.  MRI abdomen 08/10/2016 revealed stable ablation sites, no evidence of progressive metastatic disease  Restaging CT 02/08/2017-stable treated liver lesions, no evidence of progressive metastatic disease, stable borderline enlarged right inguinal node  CEA with stable mild elevation 02/08/2017, 03/01/2017, 03/29/2017,04/19/2017  CEA further elevated 08/21/2017  CTs 09/05/2017-possible local recurrence of tumor along the ablation site right hepatic lobe lesion. Cephalad to the ablation site approximately 1.9 x 2.1cmsoft tissue  mass progressive compared to the prior exam.  Liver SBRT 10/17/2017, 10/19/2017, 10/23/2017, 10/25/2017, 10/27/2017  CT 02/19/2018-progressive ill-defined areas of low attenuation with enhancement surrounding the segment 6 ablation site, stable segment 8 ablation site enlargement of extracapsular lesion posteriorly, no new hepatic lesions, wall thickening/edema at the distal ileum  CT 05/22/2018-2 new lung nodules suspicious for metastases, stable segment 8 ablation zone, slight decrease in the size of delayed perfusion at the segment 6 ablation zone, posterior extracapsular lesion is slightly smaller  CT chest 07/13/2018-multiple lung nodules consistent with metastatic disease  Cycle 1 Xeloda 07/23/2018  Cycle 2 Xeloda 08/13/2018  Cycle 3 Xeloda 09/03/2018  Cycle 4 Xeloda 09/24/2018  Restaging chest CT 10/12/2018-scattered pulmonary nodules/metastases mildly improved.  Cycle 5 Xeloda 10/15/2018  Cycle 6 Xeloda 11/05/2018  Cycle 7 Xeloda 11/26/2018  Cycle 8 Xeloda 12/17/2018  Cycle 9 Xeloda 01/07/2019  CT chest 01/24/2019-stable pulmonary nodules. No new pulmonary nodules. Stable left hepatic lobe cysts and right hepatic lobe lesions. No new hepatic lesions. Stable right ninth and 10th rib lesions.  Xeloda daily dosing schedule beginning 01/28/2019 due to hand-foot syndrome  CT 05/27/2019-enlargement of lung metastases, similar right hepatic lobe lesion  CT 08/05/2019-enlargement of bilateral lung nodules, no new lesions, stable right hepatic lesion  Cycle 1 FOLFIRI 08/13/2019  Cycle 2 FOLFIRI 09/04/2019, Irinotecan dose reduced due to neutropenia  Cycle 3 FOLFIRI 09/17/2019  Cycle 4 FOLFIRI 10/08/2019, 5-FU and irinotecan dose reduced  CT chest 10/28/2019-bilateral pulmonary metastases with minimal improvement.  Stable ablation zone in the posterior right hepatic lobe. 2. Right ovary cystadenofibroma, status post a bilateral oophorectomy 10/22/2013 3. Remote history of cervical  cancer. 4. Iron deficiency anemia-resolved. 5. Right calf deep vein thrombosis 10/24/2013-she completed 3 months of xarelto. 6. Indeterminate 12 mm hypermetabolic right pelvic density on the staging PET scan 10/04/2013-CT followup recommended per the GI tumor conference 11/13/2013. No longer visualized on a CT 07/14/2014. 7. Soft tissue implant overlying the posterior right liver on the CT 61/68/3729-MSX hypermetabolic on the PET scan 11/55/2080. 8. C. difficile colitis 03/05/2014. She completed a course of Flagyl, recurrent diarrhea beginning 03/21/2014, positive C. difficile toxin 03/26/2014-resumed Flagyl for planned 10 day course.  Vancomycin beginning 04/08/2014.  Taper complete 05/21/2014.  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 Xeloda dose/schedule adjusted beginning 01/28/2019.    Disposition: Cynthia [name] appears stable.  She would like to remain off of treatment for colon cancer.  She will return for an office, Port-A-Cath flush,and CEA in approximately 7 weeks.  We will consider scheduling a restaging chest CT at a 3-51-monthinterval.  GBetsy Coder MD  12/03/2019  12:25 PM

## 2019-12-04 ENCOUNTER — Telehealth: Payer: Self-pay | Admitting: Oncology

## 2019-12-04 NOTE — Telephone Encounter (Signed)
Scheduled per los. Called and spoke with patient. Confirmed appt 

## 2020-01-21 ENCOUNTER — Inpatient Hospital Stay: Payer: Medicare PPO

## 2020-01-21 ENCOUNTER — Encounter: Payer: Self-pay | Admitting: Nurse Practitioner

## 2020-01-21 ENCOUNTER — Inpatient Hospital Stay: Payer: Medicare PPO | Attending: Oncology | Admitting: Nurse Practitioner

## 2020-01-21 ENCOUNTER — Telehealth: Payer: Self-pay | Admitting: Oncology

## 2020-01-21 ENCOUNTER — Other Ambulatory Visit: Payer: Self-pay

## 2020-01-21 VITALS — BP 158/78 | HR 63 | Temp 97.5°F | Resp 17 | Ht 61.0 in | Wt 140.8 lb

## 2020-01-21 DIAGNOSIS — C7800 Secondary malignant neoplasm of unspecified lung: Secondary | ICD-10-CM | POA: Diagnosis not present

## 2020-01-21 DIAGNOSIS — R062 Wheezing: Secondary | ICD-10-CM | POA: Insufficient documentation

## 2020-01-21 DIAGNOSIS — R05 Cough: Secondary | ICD-10-CM | POA: Diagnosis not present

## 2020-01-21 DIAGNOSIS — C787 Secondary malignant neoplasm of liver and intrahepatic bile duct: Secondary | ICD-10-CM

## 2020-01-21 DIAGNOSIS — C189 Malignant neoplasm of colon, unspecified: Secondary | ICD-10-CM

## 2020-01-21 DIAGNOSIS — C180 Malignant neoplasm of cecum: Secondary | ICD-10-CM | POA: Insufficient documentation

## 2020-01-21 DIAGNOSIS — Z95828 Presence of other vascular implants and grafts: Secondary | ICD-10-CM

## 2020-01-21 LAB — CEA (IN HOUSE-CHCC): CEA (CHCC-In House): 3.1 ng/mL (ref 0.00–5.00)

## 2020-01-21 MED ORDER — SODIUM CHLORIDE 0.9% FLUSH
10.0000 mL | INTRAVENOUS | Status: DC | PRN
  Administered 2020-01-21: 10 mL
  Filled 2020-01-21: qty 10

## 2020-01-21 MED ORDER — HEPARIN SOD (PORK) LOCK FLUSH 100 UNIT/ML IV SOLN
500.0000 [IU] | Freq: Once | INTRAVENOUS | Status: AC | PRN
  Administered 2020-01-21: 500 [IU]
  Filled 2020-01-21: qty 5

## 2020-01-21 NOTE — Telephone Encounter (Signed)
Scheduled per 6/8 los. Printed avs and calendar for pt.  °

## 2020-01-21 NOTE — Progress Notes (Signed)
[place] OFFICE PROGRESS NOTE   Diagnosis: Colon cancer  INTERVAL HISTORY:   Ms. Melfi returns as scheduled.  She is currently maintained off of specific therapy for colon cancer.  She feels well.  Energy level continues to be improved.  Appetite is "fine".  She denies pain.  Bowels moving regularly.  No nausea or vomiting.  No fever or cough.  Stable mild dyspnea on exertion.  Objective:  Vital signs in last 24 hours:  Blood pressure (!) 158/78, pulse 63, temperature (!) 97.5 F (36.4 C), temperature source Temporal, resp. rate 17, height 5' 1" (1.549 m), weight 140 lb 12.8 oz (63.9 kg), SpO2 98 %.    HEENT: No palpable cervical or supraclavicular lymph nodes. Resp: Lungs clear bilaterally. Cardio: Regular rate and rhythm. GI: Abdomen soft and nontender.  No hepatomegaly. Vascular: No leg edema.  Skin: Right lateral chest wall with small area of mild erythema/induration.  No discrete mass. Port-A-Cath without erythema.  Lab Results:  Lab Results  Component Value Date   WBC 3.6 (L) 10/08/2019   HGB 11.1 (L) 10/08/2019   HCT 34.9 (L) 10/08/2019   MCV 93.1 10/08/2019   PLT 193 10/08/2019   NEUTROABS 1.8 10/08/2019    Imaging:  No results found.  Medications: I have reviewed the patient's current medications.  Assessment/Plan: 1. Moderately differentiated adenocarcinoma of the cecum, stage III (T4a, N1), status post a laparoscopic assisted right colectomy 10/22/2013  The tumor returned microsatellite stable with normal mismatch repair protein expression   Cycle 1 adjuvant Xeloda 12/25/2013.   Cycle 2 adjuvant Xeloda 01/15/2014.   Cycle 3 adjuvant Xeloda 02/05/2014.   Cycle 4 adjuvant Xeloda 03/18/2014, discontinued after 4 days secondary to diarrhea   Cycle 5 adjuvant Xeloda 04/16/2014, Xeloda dose reduced to 1000 mg in the a.m. and 500 mg in the p.m.   Cycle 6 adjuvant Xeloda 05/07/2014.  Cycle 7 adjuvant Xeloda  05/28/2014.  Cycle 8 adjuvant Xeloda 06/18/2014.  Restaging CTs on 07/14/2014 with no evidence of metastatic colon cancer  Colonoscopy 10/02/2014 with diverticulosis in the sigmoid colon and in the descending colon. One 4 mm polyp in the sigmoid colon with complete resection. Polyp tissue not retrieved. Internal hemorrhoids. Next colonoscopy in 3 years for surveillance.  Restaging CTs 07/16/2015 with 2 new hypodense liver lesions concerning for metastases, no other evidence of disease progression  PET scan 07/28/2015 with a single hypermetabolic right liver lesion, no other evidence of metastatic disease  Ultrasound-guided biopsy of the right liver lesion 07/30/2015 confirmed metastatic colon cancer, MSS, tumor mutation burden 1, KRAS G12D, PIK3CA, PTEN  MRI abdomen 08/20/2015 with similar to slight decrease in size of 2 hepatic metastasis. No new liver lesions or extrahepatic metastatic disease identified.  Radiofrequency ablation of 2 liver lesions 09/25/2015  MRI abdomen 02/02/2016-coagulative necrosis at the 2 ablated liver tumor sites,with no enhancement to suggest residual or recurrent liver tumor. No new liver masses or other findings of metastatic disease in the abdomen.  MRI abdomen 08/10/2016 revealed stable ablation sites, no evidence of progressive metastatic disease  Restaging CT 02/08/2017-stable treated liver lesions, no evidence of progressive metastatic disease, stable borderline enlarged right inguinal node  CEA with stable mild elevation 02/08/2017, 03/01/2017, 03/29/2017,04/19/2017  CEA further elevated 08/21/2017  CTs 09/05/2017-possible local recurrence of tumor along the ablation site right hepatic lobe lesion. Cephalad to the ablation site approximately 1.9 x 2.1cmsoft tissue mass progressive compared to the prior exam.  Liver SBRT 10/17/2017, 10/19/2017, 10/23/2017, 10/25/2017, 10/27/2017  CT  02/19/2018-progressive ill-defined areas of low attenuation with  enhancement surrounding the segment 6 ablation site, stable segment 8 ablation site enlargement of extracapsular lesion posteriorly, no new hepatic lesions, wall thickening/edema at the distal ileum  CT 05/22/2018-2 new lung nodules suspicious for metastases, stable segment 8 ablation zone, slight decrease in the size of delayed perfusion at the segment 6 ablation zone, posterior extracapsular lesion is slightly smaller  CT chest 07/13/2018-multiple lung nodules consistent with metastatic disease  Cycle 1 Xeloda 07/23/2018  Cycle 2 Xeloda 08/13/2018  Cycle 3 Xeloda 09/03/2018  Cycle 4 Xeloda 09/24/2018  Restaging chest CT 10/12/2018-scattered pulmonary nodules/metastases mildly improved.  Cycle 5 Xeloda 10/15/2018  Cycle 6 Xeloda 11/05/2018  Cycle 7 Xeloda 11/26/2018  Cycle 8 Xeloda 12/17/2018  Cycle 9 Xeloda 01/07/2019  CT chest 01/24/2019-stable pulmonary nodules. No new pulmonary nodules. Stable left hepatic lobe cysts and right hepatic lobe lesions. No new hepatic lesions. Stable right ninth and 10th rib lesions.  Xeloda daily dosing schedule beginning 01/28/2019 due to hand-foot syndrome  CT 05/27/2019-enlargement of lung metastases, similar right hepatic lobe lesion  CT 08/05/2019-enlargement of bilateral lung nodules, no new lesions, stable right hepatic lesion  Cycle 1 FOLFIRI 08/13/2019  Cycle 2 FOLFIRI 09/04/2019, Irinotecan dose reduced due to neutropenia  Cycle 3 FOLFIRI 09/17/2019  Cycle 4 FOLFIRI 10/08/2019, 5-FU and irinotecan dose reduced  CT chest 10/28/2019-bilateral pulmonary metastases with minimal improvement.  Stable ablation zone in the posterior right hepatic lobe. 2. Right ovary cystadenofibroma, status post a bilateral oophorectomy 10/22/2013 3. Remote history of cervical cancer. 4. Iron deficiency anemia-resolved. 5. Right calf deep vein thrombosis 10/24/2013-she completed 3 months of xarelto. 6. Indeterminate 12 mm hypermetabolic right pelvic density  on the staging PET scan 10/04/2013-CT followup recommended per the GI tumor conference 11/13/2013. No longer visualized on a CT 07/14/2014. 7. Soft tissue implant overlying the posterior right liver on the CT 27/10/5007-[facility] hypermetabolic on the PET scan 82/99/3716. 8. C. difficile colitis 03/05/2014. She completed a course of Flagyl, recurrent diarrhea beginning 03/21/2014, positive C. difficile toxin 03/26/2014-resumed Flagyl for planned 10 day course.  Vancomycin beginning 04/08/2014.  Taper complete 05/21/2014.  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 Xeloda dose/schedule adjusted beginning 01/28/2019.   Disposition: Ms. Swetz appears stable.  There is no clinical evidence of disease progression.  She would like to remain off of treatment for colon cancer.  We will follow-up on the CEA from today.  She will undergo a restaging chest CT prior to her next appointment.  She will return for lab, Port-A-Cath flush, chest CT on 03/03/2020.  She will be seen in follow-up on 03/05/2020.    Ned Card ANP/GNP-BC   01/21/2020  9:43 AM

## 2020-02-04 ENCOUNTER — Encounter: Payer: Self-pay | Admitting: Cardiology

## 2020-03-03 ENCOUNTER — Inpatient Hospital Stay: Payer: Medicare PPO | Attending: Oncology

## 2020-03-03 ENCOUNTER — Inpatient Hospital Stay: Payer: Medicare PPO

## 2020-03-03 ENCOUNTER — Other Ambulatory Visit: Payer: Self-pay

## 2020-03-03 ENCOUNTER — Encounter (HOSPITAL_COMMUNITY): Payer: Self-pay

## 2020-03-03 ENCOUNTER — Ambulatory Visit (HOSPITAL_COMMUNITY)
Admission: RE | Admit: 2020-03-03 | Discharge: 2020-03-03 | Disposition: A | Discharge status: Home / Self Care | Payer: Medicare PPO | Source: Ambulatory Visit | Attending: Nurse Practitioner | Admitting: Nurse Practitioner

## 2020-03-03 DIAGNOSIS — C787 Secondary malignant neoplasm of liver and intrahepatic bile duct: Secondary | ICD-10-CM

## 2020-03-03 DIAGNOSIS — Z95828 Presence of other vascular implants and grafts: Secondary | ICD-10-CM

## 2020-03-03 DIAGNOSIS — R918 Other nonspecific abnormal finding of lung field: Secondary | ICD-10-CM | POA: Insufficient documentation

## 2020-03-03 DIAGNOSIS — C180 Malignant neoplasm of cecum: Secondary | ICD-10-CM | POA: Diagnosis present

## 2020-03-03 DIAGNOSIS — C189 Malignant neoplasm of colon, unspecified: Secondary | ICD-10-CM | POA: Diagnosis not present

## 2020-03-03 LAB — CEA (IN HOUSE-CHCC): CEA (CHCC-In House): 3.19 ng/mL (ref 0.00–5.00)

## 2020-03-03 MED ORDER — HEPARIN SOD (PORK) LOCK FLUSH 100 UNIT/ML IV SOLN
500.0000 [IU] | Freq: Once | INTRAVENOUS | Status: AC | PRN
  Administered 2020-03-03: 500 [IU]
  Filled 2020-03-03: qty 5

## 2020-03-03 MED ORDER — SODIUM CHLORIDE 0.9% FLUSH
10.0000 mL | INTRAVENOUS | Status: DC | PRN
  Administered 2020-03-03: 10 mL
  Filled 2020-03-03: qty 10

## 2020-03-03 NOTE — Progress Notes (Signed)
Cynthia [name] drew blood peripherally. No blood return was noted.

## 2020-03-05 ENCOUNTER — Inpatient Hospital Stay: Payer: Medicare PPO | Admitting: Oncology

## 2020-03-05 ENCOUNTER — Other Ambulatory Visit: Payer: Self-pay

## 2020-03-05 ENCOUNTER — Telehealth: Payer: Self-pay | Admitting: Oncology

## 2020-03-05 VITALS — BP 162/91 | HR 63 | Temp 97.7°F | Resp 18 | Ht 61.0 in | Wt 138.3 lb

## 2020-03-05 DIAGNOSIS — C189 Malignant neoplasm of colon, unspecified: Secondary | ICD-10-CM

## 2020-03-05 DIAGNOSIS — C180 Malignant neoplasm of cecum: Secondary | ICD-10-CM | POA: Diagnosis not present

## 2020-03-05 DIAGNOSIS — C787 Secondary malignant neoplasm of liver and intrahepatic bile duct: Secondary | ICD-10-CM

## 2020-03-05 NOTE — Progress Notes (Signed)
[name] OFFICE PROGRESS NOTE   Diagnosis: Colon cancer  INTERVAL HISTORY:   Cynthia [name] returns as scheduled.  She feels well.  No new complaint.  Stable neuropathy symptoms in the legs and feet.  Objective:  Vital signs in last 24 hours:  Blood pressure (!) 162/91, pulse 63, temperature 97.7 F (36.5 C), temperature source Temporal, resp. rate 18, height 5' 1"  (1.549 m), weight 138 lb 4.8 oz (62.7 kg), SpO2 95 %.    Lymphatics: No cervical, supraclavicular, or axillary nodes Resp: Lungs clear bilaterally Cardio: Regular rate and rhythm GI: No hepatomegaly, nontender, no mass Vascular: No leg edema  Skin: Area of skin thickening at the right lateral chest surgical site, no discrete mass, no erythema  Portacath/PICC-without erythema  Lab Results:  Lab Results  Component Value Date   WBC 3.6 (L) 10/08/2019   HGB 11.1 (L) 10/08/2019   HCT 34.9 (L) 10/08/2019   MCV 93.1 10/08/2019   PLT 193 10/08/2019   NEUTROABS 1.8 10/08/2019    CMP  Lab Results  Component Value Date   NA 142 10/08/2019   K 4.3 10/08/2019   CL 109 10/08/2019   CO2 27 10/08/2019   GLUCOSE 79 10/08/2019   BUN 14 10/08/2019   CREATININE 0.79 10/08/2019   CALCIUM 8.7 (L) 10/08/2019   PROT 6.6 10/08/2019   ALBUMIN 3.4 (L) 10/08/2019   AST 24 10/08/2019   ALT 19 10/08/2019   ALKPHOS 64 10/08/2019   BILITOT 0.3 10/08/2019   GFRNONAA >60 10/08/2019   GFRAA >60 10/08/2019    Lab Results  Component Value Date   CEA1 3.19 03/03/2020     Imaging:  CT Chest Wo Contrast  Result Date: 03/03/2020 CLINICAL DATA:  Adenocarcinoma of the cecum. Status post right hemicolectomy on 10/22/2013. Chemotherapy in February. EXAM: CT CHEST WITHOUT CONTRAST TECHNIQUE: Multidetector CT imaging of the chest was performed following the standard protocol without IV contrast. COMPARISON:  10/28/2019 FINDINGS: Cardiovascular: Right Port-A-Cath tip low SVC. Aortic atherosclerosis. Heart size  accentuated by a pectus excavatum deformity. Multivessel coronary artery atherosclerosis. Mediastinum/Nodes: No supraclavicular adenopathy. No mediastinal or definite hilar adenopathy, given limitations of unenhanced CT. Lungs/Pleura: No pleural fluid. Bilateral pulmonary nodules/metastasis. Posterior right upper lobe index nodule 1.2 cm on 42/7. Compare 0.9 cm on the prior. Anterior right upper lobe index nodule measures 1.1 cm on 49/7 versus 7 mm on the prior. Central right upper lobe lung mass measures 3.5 x 3.1 cm on 86/7 versus 2.5 x 1.9 cm on the prior. Lingular nodule measures 1.4 cm on 84/7 versus 1.0 cm on the prior. Upper Abdomen: Left hepatic lobe cysts. Right hepatic lobe ablation site measures 2.2 cm today versus 2.5 cm on the prior. Normal imaged portions of the spleen, stomach, pancreas, adrenal glands, kidneys. Abdominal aortic atherosclerosis. Musculoskeletal: Right ninth and tenth remote rib fractures. Lower thoracic spondylosis. IMPRESSION: 1. Mild to moderate progression of pulmonary metastasis. 2. Decrease in right hepatic lobe ablation site. 3. No thoracic adenopathy. 4. Coronary artery atherosclerosis. 5. Aortic Atherosclerosis (ICD10-I70.0). Electronically Signed   By: Abigail Miyamoto M.D.   On: 03/03/2020 14:48    Medications: I have reviewed the patient's current medications.   Assessment/Plan: 1. Moderately differentiated adenocarcinoma of the cecum, stage III (T4a, N1), status post a laparoscopic assisted right colectomy 10/22/2013  The tumor returned microsatellite stable with normal mismatch repair protein expression   Cycle 1 adjuvant Xeloda 12/25/2013.   Cycle 2 adjuvant Xeloda 01/15/2014.   Cycle 3 adjuvant Xeloda  02/05/2014.   Cycle 4 adjuvant Xeloda 03/18/2014, discontinued after 4 days secondary to diarrhea   Cycle 5 adjuvant Xeloda 04/16/2014, Xeloda dose reduced to 1000 mg in the a.m. and 500 mg in the p.m.   Cycle 6 adjuvant Xeloda 05/07/2014.  Cycle 7  adjuvant Xeloda 05/28/2014.  Cycle 8 adjuvant Xeloda 06/18/2014.  Restaging CTs on 07/14/2014 with no evidence of metastatic colon cancer  Colonoscopy 10/02/2014 with diverticulosis in the sigmoid colon and in the descending colon. One 4 mm polyp in the sigmoid colon with complete resection. Polyp tissue not retrieved. Internal hemorrhoids. Next colonoscopy in 3 years for surveillance.  Restaging CTs 07/16/2015 with 2 new hypodense liver lesions concerning for metastases, no other evidence of disease progression  PET scan 07/28/2015 with a single hypermetabolic right liver lesion, no other evidence of metastatic disease  Ultrasound-guided biopsy of the right liver lesion 07/30/2015 confirmed metastatic colon cancer, MSS, tumor mutation burden 1, KRAS G12D, PIK3CA, PTEN  MRI abdomen 08/20/2015 with similar to slight decrease in size of 2 hepatic metastasis. No new liver lesions or extrahepatic metastatic disease identified.  Radiofrequency ablation of 2 liver lesions 09/25/2015  MRI abdomen 02/02/2016-coagulative necrosis at the 2 ablated liver tumor sites,with no enhancement to suggest residual or recurrent liver tumor. No new liver masses or other findings of metastatic disease in the abdomen.  MRI abdomen 08/10/2016 revealed stable ablation sites, no evidence of progressive metastatic disease  Restaging CT 02/08/2017-stable treated liver lesions, no evidence of progressive metastatic disease, stable borderline enlarged right inguinal node  CEA with stable mild elevation 02/08/2017, 03/01/2017, 03/29/2017,04/19/2017  CEA further elevated 08/21/2017  CTs 09/05/2017-possible local recurrence of tumor along the ablation site right hepatic lobe lesion. Cephalad to the ablation site approximately 1.9 x 2.1cmsoft tissue mass progressive compared to the prior exam.  Liver SBRT 10/17/2017, 10/19/2017, 10/23/2017, 10/25/2017, 10/27/2017  CT 02/19/2018-progressive ill-defined areas of low  attenuation with enhancement surrounding the segment 6 ablation site, stable segment 8 ablation site enlargement of extracapsular lesion posteriorly, no new hepatic lesions, wall thickening/edema at the distal ileum  CT 05/22/2018-2 new lung nodules suspicious for metastases, stable segment 8 ablation zone, slight decrease in the size of delayed perfusion at the segment 6 ablation zone, posterior extracapsular lesion is slightly smaller  CT chest 07/13/2018-multiple lung nodules consistent with metastatic disease  Cycle 1 Xeloda 07/23/2018  Cycle 2 Xeloda 08/13/2018  Cycle 3 Xeloda 09/03/2018  Cycle 4 Xeloda 09/24/2018  Restaging chest CT 10/12/2018-scattered pulmonary nodules/metastases mildly improved.  Cycle 5 Xeloda 10/15/2018  Cycle 6 Xeloda 11/05/2018  Cycle 7 Xeloda 11/26/2018  Cycle 8 Xeloda 12/17/2018  Cycle 9 Xeloda 01/07/2019  CT chest 01/24/2019-stable pulmonary nodules. No new pulmonary nodules. Stable left hepatic lobe cysts and right hepatic lobe lesions. No new hepatic lesions. Stable right ninth and 10th rib lesions.  Xeloda daily dosing schedule beginning 01/28/2019 due to hand-foot syndrome  CT 05/27/2019-enlargement of lung metastases, similar right hepatic lobe lesion  CT 08/05/2019-enlargement of bilateral lung nodules, no new lesions, stable right hepatic lesion  Cycle 1 FOLFIRI 08/13/2019  Cycle 2 FOLFIRI 09/04/2019, Irinotecan dose reduced due to neutropenia  Cycle 3 FOLFIRI 09/17/2019  Cycle 4 FOLFIRI 10/08/2019, 5-FU and irinotecan dose reduced  CT chest 10/28/2019-bilateral pulmonary metastases with minimal improvement.  Stable ablation zone in the posterior right hepatic lobe.  CT chest 03/03/2020-enlargement of pulmonary metastases, decreased right hepatic ablation site 2. Right ovary cystadenofibroma, status post a bilateral oophorectomy 10/22/2013 3. Remote history of cervical cancer. 4. Iron  deficiency anemia-resolved. 5. Right calf deep vein  thrombosis 10/24/2013-she completed 3 months of xarelto. 6. Indeterminate 12 mm hypermetabolic right pelvic density on the staging PET scan 10/04/2013-CT followup recommended per the GI tumor conference 11/13/2013. No longer visualized on a CT 07/14/2014. 7. Soft tissue implant overlying the posterior right liver on the CT 72/02/2181-[facility] hypermetabolic on the PET scan 37/44/5146. 8. C. difficile colitis 03/05/2014. She completed a course of Flagyl, recurrent diarrhea beginning 03/21/2014, positive C. difficile toxin 03/26/2014-resumed Flagyl for planned 10 day course.  Vancomycin beginning 04/08/2014.  Taper complete 05/21/2014.  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 Xeloda dose/schedule adjusted beginning 01/28/2019.     Disposition: Ms. Bove appears stable.  The chest CT reveals enlargement of multiple lung nodules.  There is no other evidence of disease progression.  I reviewed the CT images with Ms. Childrey.  We discussed treatment options. We discussed continued observation, resuming FOLFIRI, FOLFOX, and Lonsurf/Avastin.  We reviewed potential toxicities associated with these regimens.  She prefers observation as she is asymptomatic from the cancer.  Ms. Bilyeu will return for a Port-A-Cath flush in 6 weeks.  She will return for an office visit and restaging chest CT in 12 weeks.  She says that she will consider treatment with single agent Lonsurf in the future.  [name], MD  03/05/2020  9:48 AM

## 2020-03-05 NOTE — Telephone Encounter (Signed)
Scheduled per 7/22 los. Printed avs and calendar for pt.  

## 2020-04-14 ENCOUNTER — Inpatient Hospital Stay: Payer: Medicare PPO | Attending: Oncology

## 2020-04-14 ENCOUNTER — Other Ambulatory Visit: Payer: Self-pay

## 2020-04-14 DIAGNOSIS — C180 Malignant neoplasm of cecum: Secondary | ICD-10-CM | POA: Insufficient documentation

## 2020-04-14 DIAGNOSIS — C7800 Secondary malignant neoplasm of unspecified lung: Secondary | ICD-10-CM | POA: Diagnosis present

## 2020-04-14 DIAGNOSIS — Z95828 Presence of other vascular implants and grafts: Secondary | ICD-10-CM

## 2020-04-14 DIAGNOSIS — Z452 Encounter for adjustment and management of vascular access device: Secondary | ICD-10-CM | POA: Diagnosis not present

## 2020-04-14 MED ORDER — ALTEPLASE 2 MG IJ SOLR
INTRAMUSCULAR | Status: AC
  Filled 2020-04-14: qty 2

## 2020-04-14 MED ORDER — SODIUM CHLORIDE 0.9% FLUSH
10.0000 mL | INTRAVENOUS | Status: DC | PRN
  Administered 2020-04-14: 10 mL
  Filled 2020-04-14: qty 10

## 2020-04-14 MED ORDER — ALTEPLASE 2 MG IJ SOLR
2.0000 mg | Freq: Once | INTRAMUSCULAR | Status: AC | PRN
  Administered 2020-04-14: 2 mg
  Filled 2020-04-14: qty 2

## 2020-04-14 MED ORDER — HEPARIN SOD (PORK) LOCK FLUSH 100 UNIT/ML IV SOLN
500.0000 [IU] | Freq: Once | INTRAVENOUS | Status: AC | PRN
  Administered 2020-04-14: 500 [IU]
  Filled 2020-04-14: qty 5

## 2020-04-14 NOTE — Patient Instructions (Signed)
Alteplase, TPA injection What is this medicine? ALTEPLASE (AL te plase) can dissolve blood clots that form in the heart, blood vessels, or lungs after a heart attack. This medicine is also given to improve recovery and decrease the chance of disability in patients having symptoms of a stroke. This medicine may be used for other purposes; ask your health care provider or pharmacist if you have questions. COMMON BRAND NAME(S): Activase, Cathflo Activase What should I tell my health care provider before I take this medicine? They need to know if you have any of these conditions:  aneurysm  bleeding problems or problems with blood clotting  blood vessel disease or damaged blood vessels  diabetic retinopathy  head injury or tumor  high blood pressure  infection  irregular heartbeats  previous stroke  recent biopsy or surgery  an unusual or allergic reaction to alteplase, other medicines, foods, dyes, or preservatives  pregnant or trying to get pregnant  breast-feeding How should I use this medicine? This medicine is for injection into a vein. It is given by a health care professional in a hospital or clinic setting. Talk to your pediatrician regarding the use of this medicine in children. Special care may be needed. Overdosage: If you think you have taken too much of this medicine contact a poison control center or emergency room at once. NOTE: This medicine is only for you. Do not share this medicine with others. What if I miss a dose? This does not apply. What may interact with this medicine? Do not take this medicine with any of the following medications:  aminocaproic acid  aprotinin  tranexamic acid This medicine may also interact with the following medications:  antiinflammatory drugs, NSAIDs like ibuprofen  aspirin and aspirin-like medicines  blood thinners, like warfarin, heparin or enoxaparin  dipyridamole This list may not describe all possible  interactions. Give your health care provider a list of all the medicines, herbs, non-prescription drugs, or dietary supplements you use. Also tell them if you smoke, drink alcohol, or use illegal drugs. Some items may interact with your medicine. What should I watch for while using this medicine? Your condition will be monitored carefully while you are receiving this medicine. Follow the advice of your doctor or health care professional exactly. You may need bed rest to minimize the risk of bleeding. This medicine can make you bleed more easily. This effect can last for several days. Take special care brushing or flossing your teeth. Do not take aspirin, ibuprofen, or other nonprescription pain relievers during or for several days after alteplase treatment unless otherwise instructed by your doctor or health care professional. You may feel dizzy or lightheaded. To avoid the risk of dizzy or fainting spells, sit or stand up slowly, especially if you are an older patient. What side effects may I notice from receiving this medicine? Side effects that you should report to your doctor or health care professional as soon as possible:  allergic reactions like skin rash, itching or hives, swelling of the face, lips, or tongue  signs and symptoms of bleeding such as bloody or black, tarry stools; red or dark-brown urine; spitting up blood or brown material that looks like coffee grounds; red spots on the skin; unusual bruising or bleeding from the eye, gums, or nose  signs and symptoms of a stroke such as changes in vision; confusion; trouble speaking or understanding; severe headaches; sudden numbness or weakness of the face, arm, or leg; trouble walking; dizziness; loss of balance or   coordination  slow or fast heart rate Side effects that usually do not require medical attention (report to your doctor or health care professional if they continue or are bothersome):  dizziness  fever  nausea,  vomiting This list may not describe all possible side effects. Call your doctor for medical advice about side effects. You may report side effects to FDA at 1-800-FDA-1088. Where should I keep my medicine? This does not apply. You will not be given this medicine to store at home. NOTE: This sheet is a summary. It may not cover all possible information. If you have questions about this medicine, talk to your doctor, pharmacist, or health care provider.  2020 Elsevier/Gold Standard (2012-11-27 19:16:18)  

## 2020-05-22 ENCOUNTER — Ambulatory Visit (HOSPITAL_COMMUNITY): Payer: Medicare PPO

## 2020-05-26 ENCOUNTER — Ambulatory Visit: Payer: Medicare PPO | Admitting: Nurse Practitioner

## 2020-07-07 ENCOUNTER — Ambulatory Visit (HOSPITAL_COMMUNITY)
Admission: RE | Admit: 2020-07-07 | Discharge: 2020-07-07 | Disposition: A | Discharge status: Home / Self Care | Payer: Medicare PPO | Source: Ambulatory Visit | Attending: Oncology | Admitting: Oncology

## 2020-07-07 ENCOUNTER — Other Ambulatory Visit: Payer: Self-pay

## 2020-07-07 DIAGNOSIS — I712 Thoracic aortic aneurysm, without rupture: Secondary | ICD-10-CM | POA: Diagnosis not present

## 2020-07-07 DIAGNOSIS — C787 Secondary malignant neoplasm of liver and intrahepatic bile duct: Secondary | ICD-10-CM | POA: Diagnosis not present

## 2020-07-07 DIAGNOSIS — C7800 Secondary malignant neoplasm of unspecified lung: Secondary | ICD-10-CM | POA: Diagnosis not present

## 2020-07-07 DIAGNOSIS — I700 Atherosclerosis of aorta: Secondary | ICD-10-CM | POA: Diagnosis not present

## 2020-07-07 DIAGNOSIS — C189 Malignant neoplasm of colon, unspecified: Secondary | ICD-10-CM | POA: Insufficient documentation

## 2020-07-07 DIAGNOSIS — I2510 Atherosclerotic heart disease of native coronary artery without angina pectoris: Secondary | ICD-10-CM | POA: Diagnosis not present

## 2020-07-14 ENCOUNTER — Other Ambulatory Visit: Payer: Self-pay

## 2020-07-14 ENCOUNTER — Inpatient Hospital Stay: Payer: Medicare PPO | Attending: Oncology | Admitting: Nurse Practitioner

## 2020-07-14 VITALS — BP 168/82 | HR 72 | Temp 97.8°F | Resp 15 | Ht 61.0 in | Wt 138.2 lb

## 2020-07-14 DIAGNOSIS — C7800 Secondary malignant neoplasm of unspecified lung: Secondary | ICD-10-CM | POA: Diagnosis not present

## 2020-07-14 DIAGNOSIS — C189 Malignant neoplasm of colon, unspecified: Secondary | ICD-10-CM

## 2020-07-14 DIAGNOSIS — C787 Secondary malignant neoplasm of liver and intrahepatic bile duct: Secondary | ICD-10-CM

## 2020-07-14 DIAGNOSIS — Z95828 Presence of other vascular implants and grafts: Secondary | ICD-10-CM | POA: Diagnosis not present

## 2020-07-14 DIAGNOSIS — C180 Malignant neoplasm of cecum: Secondary | ICD-10-CM | POA: Diagnosis not present

## 2020-07-14 NOTE — Progress Notes (Addendum)
[place] OFFICE PROGRESS NOTE   Diagnosis: Colon cancer  INTERVAL HISTORY:   Cynthia [name] returns as scheduled.  Overall she feels well.  No fever or cough.  She has mild dyspnea on exertion "once in a while", typically occurring when she is outside being active.  She describes her appetite as "fine".  No change in bowel habits.  Objective:  Vital signs in last 24 hours:  Blood pressure (!) 168/82, pulse 72, temperature 97.8 F (36.6 C), temperature source Tympanic, resp. rate 15, height 5' 1"  (1.549 m), weight 138 lb 3.2 oz (62.7 kg), SpO2 99 %.    HEENT: Neck without mass. Lymphatics: No palpable cervical, supraclavicular or axillary lymph nodes. Resp: Lungs clear bilaterally. Cardio: Regular rate and rhythm. GI: Abdomen soft and nontender.  No hepatomegaly. Vascular: Trace bilateral lower leg edema.  Skin: Area of skin thickening right lateral chest surgical site, no palpable mass, no erythema. Port-A-Cath without erythema.   Lab Results:  Lab Results  Component Value Date   WBC 3.6 (L) 10/08/2019   HGB 11.1 (L) 10/08/2019   HCT 34.9 (L) 10/08/2019   MCV 93.1 10/08/2019   PLT 193 10/08/2019   NEUTROABS 1.8 10/08/2019    Imaging:  No results found.  Medications: I have reviewed the patient's current medications.  Assessment/Plan: 1. Moderately differentiated adenocarcinoma of the cecum, stage III (T4a, N1), status post a laparoscopic assisted right colectomy 10/22/2013  The tumor returned microsatellite stable with normal mismatch repair protein expression   Cycle 1 adjuvant Xeloda 12/25/2013.   Cycle 2 adjuvant Xeloda 01/15/2014.   Cycle 3 adjuvant Xeloda 02/05/2014.   Cycle 4 adjuvant Xeloda 03/18/2014, discontinued after 4 days secondary to diarrhea   Cycle 5 adjuvant Xeloda 04/16/2014, Xeloda dose reduced to 1000 mg in the a.m. and 500 mg in the p.m.   Cycle 6 adjuvant Xeloda 05/07/2014.  Cycle 7 adjuvant Xeloda  05/28/2014.  Cycle 8 adjuvant Xeloda 06/18/2014.  Restaging CTs on 07/14/2014 with no evidence of metastatic colon cancer  Colonoscopy 10/02/2014 with diverticulosis in the sigmoid colon and in the descending colon. One 4 mm polyp in the sigmoid colon with complete resection. Polyp tissue not retrieved. Internal hemorrhoids. Next colonoscopy in 3 years for surveillance.  Restaging CTs 07/16/2015 with 2 new hypodense liver lesions concerning for metastases, no other evidence of disease progression  PET scan 07/28/2015 with a single hypermetabolic right liver lesion, no other evidence of metastatic disease  Ultrasound-guided biopsy of the right liver lesion 07/30/2015 confirmed metastatic colon cancer, MSS, tumor mutation burden 1, KRAS G12D, PIK3CA, PTEN  MRI abdomen 08/20/2015 with similar to slight decrease in size of 2 hepatic metastasis. No new liver lesions or extrahepatic metastatic disease identified.  Radiofrequency ablation of 2 liver lesions 09/25/2015  MRI abdomen 02/02/2016-coagulative necrosis at the 2 ablated liver tumor sites,with no enhancement to suggest residual or recurrent liver tumor. No new liver masses or other findings of metastatic disease in the abdomen.  MRI abdomen 08/10/2016 revealed stable ablation sites, no evidence of progressive metastatic disease  Restaging CT 02/08/2017-stable treated liver lesions, no evidence of progressive metastatic disease, stable borderline enlarged right inguinal node  CEA with stable mild elevation 02/08/2017, 03/01/2017, 03/29/2017,04/19/2017  CEA further elevated 08/21/2017  CTs 09/05/2017-possible local recurrence of tumor along the ablation site right hepatic lobe lesion. Cephalad to the ablation site approximately 1.9 x 2.1cmsoft tissue mass progressive compared to the prior exam.  Liver SBRT 10/17/2017, 10/19/2017, 10/23/2017, 10/25/2017, 10/27/2017  CT 02/19/2018-progressive ill-defined  areas of low attenuation with  enhancement surrounding the segment 6 ablation site, stable segment 8 ablation site enlargement of extracapsular lesion posteriorly, no new hepatic lesions, wall thickening/edema at the distal ileum  CT 05/22/2018-2 new lung nodules suspicious for metastases, stable segment 8 ablation zone, slight decrease in the size of delayed perfusion at the segment 6 ablation zone, posterior extracapsular lesion is slightly smaller  CT chest 07/13/2018-multiple lung nodules consistent with metastatic disease  Cycle 1 Xeloda 07/23/2018  Cycle 2 Xeloda 08/13/2018  Cycle 3 Xeloda 09/03/2018  Cycle 4 Xeloda 09/24/2018  Restaging chest CT 10/12/2018-scattered pulmonary nodules/metastases mildly improved.  Cycle 5 Xeloda 10/15/2018  Cycle 6 Xeloda 11/05/2018  Cycle 7 Xeloda 11/26/2018  Cycle 8 Xeloda 12/17/2018  Cycle 9 Xeloda 01/07/2019  CT chest 01/24/2019-stable pulmonary nodules. No new pulmonary nodules. Stable left hepatic lobe cysts and right hepatic lobe lesions. No new hepatic lesions. Stable right ninth and 10th rib lesions.  Xeloda daily dosing schedule beginning 01/28/2019 due to hand-foot syndrome  CT 05/27/2019-enlargement of lung metastases, similar right hepatic lobe lesion  CT 08/05/2019-enlargement of bilateral lung nodules, no new lesions, stable right hepatic lesion  Cycle 1 FOLFIRI 08/13/2019  Cycle 2 FOLFIRI 09/04/2019, Irinotecan dose reduced due to neutropenia  Cycle 3 FOLFIRI 09/17/2019  Cycle 4 FOLFIRI 10/08/2019, 5-FU and irinotecan dose reduced  CT chest 10/28/2019-bilateral pulmonary metastases with minimal improvement. Stable ablation zone in the posterior right hepatic lobe.  CT chest 03/03/2020-enlargement of pulmonary metastases, decreased right hepatic ablation site  CT chest 07/07/2020-enlargement of pulmonary metastases, stable ablation site involving right hepatic lobe 2. Right ovary cystadenofibroma, status post a bilateral oophorectomy 10/22/2013 3. Remote  history of cervical cancer. 4. Iron deficiency anemia-resolved. 5. Right calf deep vein thrombosis 10/24/2013-she completed 3 months of xarelto. 6. Indeterminate 12 mm hypermetabolic right pelvic density on the staging PET scan 10/04/2013-CT followup recommended per the GI tumor conference 11/13/2013. No longer visualized on a CT 07/14/2014. 7. Soft tissue implant overlying the posterior right liver on the CT 96/78/9381-OFB hypermetabolic on the PET scan 51/09/5850. 8. C. difficile colitis 03/05/2014. She completed a course of Flagyl, recurrent diarrhea beginning 03/21/2014, positive C. difficile toxin 03/26/2014-resumed Flagyl for planned 10 day course.  Vancomycin beginning 04/08/2014.  Taper complete 05/21/2014.  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 Xeloda dose/schedule adjusted beginning 01/28/2019.   Disposition: Ms. Legendre appears stable.  Recent restaging chest CT shows continued progression of lung nodules. CT results/images reviewed with her at today's appointment. Dr. Benay Spice discussed options to include possible SBRT to the largest lung lesion, systemic therapy with FOLFIRI/Avastin (she declines FOLFOX due to pre-existing neuropathy), supportive care. She would like to proceed with FOLFIRI/Avastin. She has had FOLFIRI in the past and is familiar with potential toxicities. We reviewed potential side effects associated with Avastin including hypertension, bleeding, delayed wound healing, proteinuria, increased risk of blood clot/stroke, posterior reversible encephalopathy syndrome. She agrees to proceed. She has a Port-A-Cath but it is not functional at present. We are referring her for a dye study. Plan at present is to proceed with cycle one FOLFIRI/Avastin in 2 weeks, we will see her prior to treatment that day.  Patient seen with Dr. Benay Spice.    Ned Card ANP/GNP-BC   07/14/2020  12:43 PM  This was a shared visit with Ned Card.   Ms. Babiarz was interviewed and examined.  We reviewed the restaging CT images with her.  There is radiologic evidence of disease progression in the chest.  We  discussed treatment options including comfort care and systemic therapy with FOLFIRI/Avastin.  She agrees to proceed with FOLFIRI/Avastin.  We reviewed potential toxicities associated with this regimen.  Julieanne Manson, MD

## 2020-07-16 ENCOUNTER — Telehealth: Payer: Self-pay | Admitting: Nurse Practitioner

## 2020-07-16 NOTE — Telephone Encounter (Signed)
Scheduled appointments per 11/30 los. Called patient, no answer. Left message for patient with appointments dates and times.

## 2020-07-20 ENCOUNTER — Ambulatory Visit (HOSPITAL_COMMUNITY)
Admission: RE | Admit: 2020-07-20 | Discharge: 2020-07-20 | Disposition: A | Discharge status: Home / Self Care | Payer: Medicare PPO | Source: Ambulatory Visit | Attending: Nurse Practitioner | Admitting: Nurse Practitioner

## 2020-07-20 ENCOUNTER — Other Ambulatory Visit: Payer: Self-pay | Admitting: Nurse Practitioner

## 2020-07-20 ENCOUNTER — Other Ambulatory Visit: Payer: Self-pay

## 2020-07-20 DIAGNOSIS — Z95828 Presence of other vascular implants and grafts: Secondary | ICD-10-CM

## 2020-07-20 DIAGNOSIS — Z452 Encounter for adjustment and management of vascular access device: Secondary | ICD-10-CM | POA: Diagnosis not present

## 2020-07-20 DIAGNOSIS — T82828A Fibrosis of vascular prosthetic devices, implants and grafts, initial encounter: Secondary | ICD-10-CM | POA: Diagnosis not present

## 2020-07-20 HISTORY — PX: IR CV LINE INJECTION: IMG2294

## 2020-07-20 MED ORDER — IOHEXOL 300 MG/ML  SOLN
50.0000 mL | Freq: Once | INTRAMUSCULAR | Status: AC | PRN
  Administered 2020-07-20: 12 mL via INTRAVENOUS

## 2020-07-20 MED ORDER — HEPARIN SOD (PORK) LOCK FLUSH 100 UNIT/ML IV SOLN
INTRAVENOUS | Status: AC
  Administered 2020-07-20: 500 [IU]
  Filled 2020-07-20: qty 5

## 2020-07-26 ENCOUNTER — Other Ambulatory Visit: Payer: Self-pay | Admitting: Oncology

## 2020-07-27 ENCOUNTER — Encounter: Payer: Self-pay | Admitting: Nurse Practitioner

## 2020-07-27 ENCOUNTER — Other Ambulatory Visit: Payer: Self-pay

## 2020-07-27 ENCOUNTER — Inpatient Hospital Stay: Payer: Medicare PPO | Admitting: Nurse Practitioner

## 2020-07-27 ENCOUNTER — Inpatient Hospital Stay: Payer: Medicare PPO

## 2020-07-27 ENCOUNTER — Inpatient Hospital Stay: Payer: Medicare PPO | Attending: Oncology

## 2020-07-27 VITALS — BP 136/72 | HR 59 | Temp 97.4°F | Resp 16 | Ht 61.0 in | Wt 136.8 lb

## 2020-07-27 DIAGNOSIS — C787 Secondary malignant neoplasm of liver and intrahepatic bile duct: Secondary | ICD-10-CM | POA: Diagnosis not present

## 2020-07-27 DIAGNOSIS — C7800 Secondary malignant neoplasm of unspecified lung: Secondary | ICD-10-CM | POA: Diagnosis not present

## 2020-07-27 DIAGNOSIS — C189 Malignant neoplasm of colon, unspecified: Secondary | ICD-10-CM

## 2020-07-27 DIAGNOSIS — C180 Malignant neoplasm of cecum: Secondary | ICD-10-CM | POA: Insufficient documentation

## 2020-07-27 DIAGNOSIS — Z5111 Encounter for antineoplastic chemotherapy: Secondary | ICD-10-CM | POA: Diagnosis not present

## 2020-07-27 LAB — CMP (CANCER CENTER ONLY)
ALT: 18 U/L (ref 0–44)
AST: 28 U/L (ref 15–41)
Albumin: 3.3 g/dL — ABNORMAL LOW (ref 3.5–5.0)
Alkaline Phosphatase: 55 U/L (ref 38–126)
Anion gap: 7 (ref 5–15)
BUN: 14 mg/dL (ref 8–23)
CO2: 27 mmol/L (ref 22–32)
Calcium: 9.4 mg/dL (ref 8.9–10.3)
Chloride: 108 mmol/L (ref 98–111)
Creatinine: 0.95 mg/dL (ref 0.44–1.00)
GFR, Estimated: 60 mL/min — ABNORMAL LOW (ref >60)
Glucose, Bld: 74 mg/dL (ref 70–99)
Potassium: 4.3 mmol/L (ref 3.5–5.1)
Sodium: 142 mmol/L (ref 135–145)
Total Bilirubin: 0.5 mg/dL (ref 0.3–1.2)
Total Protein: 6.7 g/dL (ref 6.5–8.1)

## 2020-07-27 LAB — CEA (IN HOUSE-CHCC): CEA (CHCC-In House): 3.98 ng/mL (ref 0.00–5.00)

## 2020-07-27 LAB — CBC WITH DIFFERENTIAL (CANCER CENTER ONLY)
Abs Immature Granulocytes: 0.01 10*3/uL (ref 0.00–0.07)
Basophils Absolute: 0 10*3/uL (ref 0.0–0.1)
Basophils Relative: 0 %
Eosinophils Absolute: 0.1 10*3/uL (ref 0.0–0.5)
Eosinophils Relative: 3 %
HCT: 35.4 % — ABNORMAL LOW (ref 36.0–46.0)
Hemoglobin: 11.4 g/dL — ABNORMAL LOW (ref 12.0–15.0)
Immature Granulocytes: 0 %
Lymphocytes Relative: 22 %
Lymphs Abs: 1.1 10*3/uL (ref 0.7–4.0)
MCH: 28.5 pg (ref 26.0–34.0)
MCHC: 32.2 g/dL (ref 30.0–36.0)
MCV: 88.5 fL (ref 80.0–100.0)
Monocytes Absolute: 0.4 10*3/uL (ref 0.1–1.0)
Monocytes Relative: 8 %
Neutro Abs: 3.5 10*3/uL (ref 1.7–7.7)
Neutrophils Relative %: 67 %
Platelet Count: 195 10*3/uL (ref 150–400)
RBC: 4 MIL/uL (ref 3.87–5.11)
RDW: 14.3 % (ref 11.5–15.5)
WBC Count: 5.2 10*3/uL (ref 4.0–10.5)
nRBC: 0 % (ref 0.0–0.2)

## 2020-07-27 MED ORDER — DEXAMETHASONE SODIUM PHOSPHATE 10 MG/ML IJ SOLN: 10.0000 mg | Freq: Once | INTRAMUSCULAR | Status: DC

## 2020-07-27 MED ORDER — SODIUM CHLORIDE 0.9 % IV SOLN
200.0000 mg/m2 | Freq: Once | INTRAVENOUS | Status: AC
  Administered 2020-07-27: 11:00:00 338 mg via INTRAVENOUS
  Filled 2020-07-27: qty 16.9

## 2020-07-27 MED ORDER — SODIUM CHLORIDE 0.9% FLUSH
10.0000 mL | INTRAVENOUS | Status: DC | PRN
  Filled 2020-07-27: qty 10

## 2020-07-27 MED ORDER — SODIUM CHLORIDE 0.9 % IV SOLN
Freq: Once | INTRAVENOUS | Status: AC
  Filled 2020-07-27: qty 250

## 2020-07-27 MED ORDER — HEPARIN SOD (PORK) LOCK FLUSH 100 UNIT/ML IV SOLN
500.0000 [IU] | Freq: Once | INTRAVENOUS | Status: DC | PRN
  Filled 2020-07-27: qty 5

## 2020-07-27 MED ORDER — PALONOSETRON HCL INJECTION 0.25 MG/5ML
INTRAVENOUS | Status: AC
  Filled 2020-07-27: qty 5

## 2020-07-27 MED ORDER — SODIUM CHLORIDE 0.9 % IV SOLN
10.0000 mg | Freq: Once | INTRAVENOUS | Status: AC
  Administered 2020-07-27: 11:00:00 10 mg via INTRAVENOUS
  Filled 2020-07-27: qty 10

## 2020-07-27 MED ORDER — SODIUM CHLORIDE 0.9 % IV SOLN
100.0000 mg/m2 | Freq: Once | INTRAVENOUS | Status: AC
  Administered 2020-07-27: 12:00:00 160 mg via INTRAVENOUS
  Filled 2020-07-27: qty 8

## 2020-07-27 MED ORDER — PALONOSETRON HCL INJECTION 0.25 MG/5ML
0.2500 mg | Freq: Once | INTRAVENOUS | Status: AC
  Administered 2020-07-27: 10:00:00 0.25 mg via INTRAVENOUS

## 2020-07-27 MED ORDER — SODIUM CHLORIDE 0.9 % IV SOLN
300.0000 mg | Freq: Once | INTRAVENOUS | Status: AC
  Administered 2020-07-27: 11:00:00 300 mg via INTRAVENOUS
  Filled 2020-07-27: qty 12

## 2020-07-27 MED ORDER — SODIUM CHLORIDE 0.9 % IV SOLN
1600.0000 mg/m2 | INTRAVENOUS | Status: DC
  Administered 2020-07-27: 13:00:00 2700 mg via INTRAVENOUS
  Filled 2020-07-27: qty 54

## 2020-07-27 MED ORDER — DEXAMETHASONE SODIUM PHOSPHATE 10 MG/ML IJ SOLN
INTRAMUSCULAR | Status: AC
  Filled 2020-07-27: qty 1

## 2020-07-27 MED ORDER — ATROPINE SULFATE 1 MG/ML IJ SOLN
0.5000 mg | Freq: Once | INTRAMUSCULAR | Status: AC | PRN
  Administered 2020-07-27: 11:00:00 0.5 mg via INTRAVENOUS

## 2020-07-27 MED ORDER — ATROPINE SULFATE 1 MG/ML IJ SOLN
INTRAMUSCULAR | Status: AC
  Filled 2020-07-27: qty 1

## 2020-07-27 NOTE — Progress Notes (Signed)
Ok to treat today without Urine protein per Dr. Benay Spice.Pt discharged in no apparent distress. Pt left ambulatory without assistance.  Pt aware of discharge instructions and verbalized understanding and had no further questions.

## 2020-07-27 NOTE — Patient Instructions (Signed)

## 2020-07-27 NOTE — Patient Instructions (Signed)
[facility] Discharge Instructions for Patients Receiving Chemotherapy  Today you received the following chemotherapy agents:  Irinotecan, Leucovorin, Fluorouracil,avastin  To help prevent nausea and vomiting after your treatment, we encourage you to take your nausea medication as prescribed.   If you develop nausea and vomiting that is not controlled by your nausea medication, call the clinic.   BELOW ARE SYMPTOMS THAT SHOULD BE REPORTED IMMEDIATELY:  *FEVER GREATER THAN 100.5 F  *CHILLS WITH OR WITHOUT FEVER  NAUSEA AND VOMITING THAT IS NOT CONTROLLED WITH YOUR NAUSEA MEDICATION  *UNUSUAL SHORTNESS OF BREATH  *UNUSUAL BRUISING OR BLEEDING  TENDERNESS IN MOUTH AND THROAT WITH OR WITHOUT PRESENCE OF ULCERS  *URINARY PROBLEMS  *BOWEL PROBLEMS  UNUSUAL RASH Items with * indicate a potential emergency and should be followed up as soon as possible.  Feel free to call the clinic should you have any questions or concerns. The clinic phone number is [phone].  Please show the [place] at check-in to the Emergency Department and triage nurse.

## 2020-07-27 NOTE — Progress Notes (Signed)
[place] OFFICE PROGRESS NOTE   Diagnosis: Colon cancer  INTERVAL HISTORY:   Ms. Ranes returns as scheduled.  She has mild dyspnea on exertion, slight cough.  She wonders if the cough is related to raking leaves.  No fever.  No nausea or vomiting.  No constipation or diarrhea.  She denies pain.  Objective:  Vital signs in last 24 hours:  Blood pressure 136/72, pulse (!) 59, temperature (!) 97.4 F (36.3 C), temperature source Tympanic, resp. rate 16, height 5' 1"  (1.549 m), weight 136 lb 12.8 oz (62.1 kg), SpO2 96 %.    HEENT: No thrush or ulcers. Resp: Lungs clear bilaterally. Cardio: Regular rate and rhythm. GI: No hepatomegaly. Vascular: No leg edema. Neuro: Alert and oriented. Skin: Skin thickening right lateral chest surgical site.  No erythema. Port-A-Cath without erythema.   Lab Results:  Lab Results  Component Value Date   WBC 5.2 07/27/2020   HGB 11.4 (L) 07/27/2020   HCT 35.4 (L) 07/27/2020   MCV 88.5 07/27/2020   PLT 195 07/27/2020   NEUTROABS 3.5 07/27/2020    Imaging:  No results found.  Medications: I have reviewed the patient's current medications.  Assessment/Plan: 1. Moderately differentiated adenocarcinoma of the cecum, stage III (T4a, N1), status post a laparoscopic assisted right colectomy 10/22/2013  The tumor returned microsatellite stable with normal mismatch repair protein expression   Cycle 1 adjuvant Xeloda 12/25/2013.   Cycle 2 adjuvant Xeloda 01/15/2014.   Cycle 3 adjuvant Xeloda 02/05/2014.   Cycle 4 adjuvant Xeloda 03/18/2014, discontinued after 4 days secondary to diarrhea   Cycle 5 adjuvant Xeloda 04/16/2014, Xeloda dose reduced to 1000 mg in the a.m. and 500 mg in the p.m.   Cycle 6 adjuvant Xeloda 05/07/2014.  Cycle 7 adjuvant Xeloda 05/28/2014.  Cycle 8 adjuvant Xeloda 06/18/2014.  Restaging CTs on 07/14/2014 with no evidence of metastatic colon cancer  Colonoscopy 10/02/2014 with  diverticulosis in the sigmoid colon and in the descending colon. One 4 mm polyp in the sigmoid colon with complete resection. Polyp tissue not retrieved. Internal hemorrhoids. Next colonoscopy in 3 years for surveillance.  Restaging CTs 07/16/2015 with 2 new hypodense liver lesions concerning for metastases, no other evidence of disease progression  PET scan 07/28/2015 with a single hypermetabolic right liver lesion, no other evidence of metastatic disease  Ultrasound-guided biopsy of the right liver lesion 07/30/2015 confirmed metastatic colon cancer, MSS, tumor mutation burden 1, KRAS G12D, PIK3CA, PTEN  MRI abdomen 08/20/2015 with similar to slight decrease in size of 2 hepatic metastasis. No new liver lesions or extrahepatic metastatic disease identified.  Radiofrequency ablation of 2 liver lesions 09/25/2015  MRI abdomen 02/02/2016-coagulative necrosis at the 2 ablated liver tumor sites,with no enhancement to suggest residual or recurrent liver tumor. No new liver masses or other findings of metastatic disease in the abdomen.  MRI abdomen 08/10/2016 revealed stable ablation sites, no evidence of progressive metastatic disease  Restaging CT 02/08/2017-stable treated liver lesions, no evidence of progressive metastatic disease, stable borderline enlarged right inguinal node  CEA with stable mild elevation 02/08/2017, 03/01/2017, 03/29/2017,04/19/2017  CEA further elevated 08/21/2017  CTs 09/05/2017-possible local recurrence of tumor along the ablation site right hepatic lobe lesion. Cephalad to the ablation site approximately 1.9 x 2.1cmsoft tissue mass progressive compared to the prior exam.  Liver SBRT 10/17/2017, 10/19/2017, 10/23/2017, 10/25/2017, 10/27/2017  CT 02/19/2018-progressive ill-defined areas of low attenuation with enhancement surrounding the segment 6 ablation site, stable segment 8 ablation site enlargement of extracapsular lesion  posteriorly, no new hepatic lesions, wall  thickening/edema at the distal ileum  CT 05/22/2018-2 new lung nodules suspicious for metastases, stable segment 8 ablation zone, slight decrease in the size of delayed perfusion at the segment 6 ablation zone, posterior extracapsular lesion is slightly smaller  CT chest 07/13/2018-multiple lung nodules consistent with metastatic disease  Cycle 1 Xeloda 07/23/2018  Cycle 2 Xeloda 08/13/2018  Cycle 3 Xeloda 09/03/2018  Cycle 4 Xeloda 09/24/2018  Restaging chest CT 10/12/2018-scattered pulmonary nodules/metastases mildly improved.  Cycle 5 Xeloda 10/15/2018  Cycle 6 Xeloda 11/05/2018  Cycle 7 Xeloda 11/26/2018  Cycle 8 Xeloda 12/17/2018  Cycle 9 Xeloda 01/07/2019  CT chest 01/24/2019-stable pulmonary nodules. No new pulmonary nodules. Stable left hepatic lobe cysts and right hepatic lobe lesions. No new hepatic lesions. Stable right ninth and 10th rib lesions.  Xeloda daily dosing schedule beginning 01/28/2019 due to hand-foot syndrome  CT 05/27/2019-enlargement of lung metastases, similar right hepatic lobe lesion  CT 08/05/2019-enlargement of bilateral lung nodules, no new lesions, stable right hepatic lesion  Cycle 1 FOLFIRI 08/13/2019  Cycle 2 FOLFIRI 09/04/2019, Irinotecan dose reduced due to neutropenia  Cycle 3 FOLFIRI 09/17/2019  Cycle 4 FOLFIRI 10/08/2019, 5-FU and irinotecan dose reduced  CT chest 10/28/2019-bilateral pulmonary metastases with minimal improvement. Stable ablation zone in the posterior right hepatic lobe.  CT chest 03/03/2020-enlargement of pulmonary metastases, decreased right hepatic ablation site  CT chest 07/07/2020-enlargement of pulmonary metastases, stable ablation site involving right hepatic lobe  Cycle 1 FOLFIRI/Avastin 2. Right ovary cystadenofibroma, status post a bilateral oophorectomy 10/22/2013 3. Remote history of cervical cancer. 4. Iron deficiency anemia-resolved. 5. Right calf deep vein thrombosis 10/24/2013-she completed 3 months  of xarelto. 6. Indeterminate 12 mm hypermetabolic right pelvic density on the staging PET scan 10/04/2013-CT followup recommended per the GI tumor conference 11/13/2013. No longer visualized on a CT 07/14/2014. 7. Soft tissue implant overlying the posterior right liver on the CT 63/84/5364-WOE hypermetabolic on the PET scan 32/07/2481. 8. C. difficile colitis 03/05/2014. She completed a course of Flagyl, recurrent diarrhea beginning 03/21/2014, positive C. difficile toxin 03/26/2014-resumed Flagyl for planned 10 day course.  Vancomycin beginning 04/08/2014.  Taper complete 05/21/2014.  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 Xeloda dose/schedule adjusted beginning 01/28/2019.   Disposition: Ms. Safranek appears stable.  She is scheduled to begin treatment today with cycle 1 FOLFIRI/Avastin.  We again reviewed potential toxicities.  She agrees to proceed.  We reviewed the CBC from today.  Labs adequate to proceed with treatment.  She will return for lab, follow-up, cycle 2 FOLFIRI/Avastin in 3 weeks.  She will contact the office in the interim with any problems.    Ned Card ANP/GNP-BC   07/27/2020  9:52 AM

## 2020-07-28 ENCOUNTER — Telehealth: Payer: Self-pay | Admitting: Nurse Practitioner

## 2020-07-28 NOTE — Telephone Encounter (Signed)
Scheduled appointments per 12/13 los. Spoke to patient who is aware of appointments dates and times.

## 2020-07-29 ENCOUNTER — Inpatient Hospital Stay: Payer: Medicare PPO

## 2020-07-29 ENCOUNTER — Other Ambulatory Visit: Payer: Self-pay

## 2020-07-29 VITALS — BP 172/92 | HR 58 | Resp 16

## 2020-07-29 DIAGNOSIS — C7800 Secondary malignant neoplasm of unspecified lung: Secondary | ICD-10-CM | POA: Diagnosis not present

## 2020-07-29 DIAGNOSIS — C189 Malignant neoplasm of colon, unspecified: Secondary | ICD-10-CM

## 2020-07-29 DIAGNOSIS — C787 Secondary malignant neoplasm of liver and intrahepatic bile duct: Secondary | ICD-10-CM | POA: Diagnosis not present

## 2020-07-29 DIAGNOSIS — C180 Malignant neoplasm of cecum: Secondary | ICD-10-CM | POA: Diagnosis not present

## 2020-07-29 DIAGNOSIS — Z5111 Encounter for antineoplastic chemotherapy: Secondary | ICD-10-CM | POA: Diagnosis not present

## 2020-07-29 MED ORDER — SODIUM CHLORIDE 0.9% FLUSH
10.0000 mL | INTRAVENOUS | Status: DC | PRN
  Administered 2020-07-29: 11:00:00 10 mL
  Filled 2020-07-29: qty 10

## 2020-07-29 MED ORDER — HEPARIN SOD (PORK) LOCK FLUSH 100 UNIT/ML IV SOLN
500.0000 [IU] | Freq: Once | INTRAVENOUS | Status: AC | PRN
  Administered 2020-07-29: 11:00:00 500 [IU]
  Filled 2020-07-29: qty 5

## 2020-08-16 ENCOUNTER — Other Ambulatory Visit: Payer: Self-pay | Admitting: Oncology

## 2020-08-18 ENCOUNTER — Inpatient Hospital Stay: Payer: Medicare PPO

## 2020-08-18 ENCOUNTER — Inpatient Hospital Stay: Payer: Medicare PPO | Admitting: Oncology

## 2020-08-18 ENCOUNTER — Other Ambulatory Visit: Payer: Self-pay

## 2020-08-18 ENCOUNTER — Inpatient Hospital Stay: Payer: Medicare PPO | Attending: Oncology

## 2020-08-18 VITALS — BP 166/97 | HR 74 | Temp 97.0°F | Resp 15 | Ht 61.0 in | Wt 131.6 lb

## 2020-08-18 VITALS — BP 157/78

## 2020-08-18 DIAGNOSIS — D709 Neutropenia, unspecified: Secondary | ICD-10-CM | POA: Diagnosis not present

## 2020-08-18 DIAGNOSIS — C180 Malignant neoplasm of cecum: Secondary | ICD-10-CM | POA: Diagnosis not present

## 2020-08-18 DIAGNOSIS — C787 Secondary malignant neoplasm of liver and intrahepatic bile duct: Secondary | ICD-10-CM

## 2020-08-18 DIAGNOSIS — Z5111 Encounter for antineoplastic chemotherapy: Secondary | ICD-10-CM | POA: Insufficient documentation

## 2020-08-18 DIAGNOSIS — C7802 Secondary malignant neoplasm of left lung: Secondary | ICD-10-CM | POA: Insufficient documentation

## 2020-08-18 DIAGNOSIS — C7801 Secondary malignant neoplasm of right lung: Secondary | ICD-10-CM | POA: Diagnosis not present

## 2020-08-18 DIAGNOSIS — Z95828 Presence of other vascular implants and grafts: Secondary | ICD-10-CM

## 2020-08-18 DIAGNOSIS — C189 Malignant neoplasm of colon, unspecified: Secondary | ICD-10-CM | POA: Diagnosis not present

## 2020-08-18 DIAGNOSIS — Z8669 Personal history of other diseases of the nervous system and sense organs: Secondary | ICD-10-CM | POA: Insufficient documentation

## 2020-08-18 LAB — CMP (CANCER CENTER ONLY)
ALT: 24 U/L (ref 0–44)
AST: 30 U/L (ref 15–41)
Albumin: 3.4 g/dL — ABNORMAL LOW (ref 3.5–5.0)
Alkaline Phosphatase: 61 U/L (ref 38–126)
Anion gap: 7 (ref 5–15)
BUN: 8 mg/dL (ref 8–23)
CO2: 27 mmol/L (ref 22–32)
Calcium: 9 mg/dL (ref 8.9–10.3)
Chloride: 107 mmol/L (ref 98–111)
Creatinine: 0.84 mg/dL (ref 0.44–1.00)
GFR, Estimated: >60 mL/min (ref >60)
Glucose, Bld: 82 mg/dL (ref 70–99)
Potassium: 4.3 mmol/L (ref 3.5–5.1)
Sodium: 141 mmol/L (ref 135–145)
Total Bilirubin: 0.6 mg/dL (ref 0.3–1.2)
Total Protein: 6.5 g/dL (ref 6.5–8.1)

## 2020-08-18 LAB — CBC WITH DIFFERENTIAL (CANCER CENTER ONLY)
Abs Immature Granulocytes: 0 10*3/uL (ref 0.00–0.07)
Basophils Absolute: 0 10*3/uL (ref 0.0–0.1)
Basophils Relative: 1 %
Eosinophils Absolute: 0.1 10*3/uL (ref 0.0–0.5)
Eosinophils Relative: 4 %
HCT: 34.2 % — ABNORMAL LOW (ref 36.0–46.0)
Hemoglobin: 11.3 g/dL — ABNORMAL LOW (ref 12.0–15.0)
Immature Granulocytes: 0 %
Lymphocytes Relative: 29 %
Lymphs Abs: 1.1 10*3/uL (ref 0.7–4.0)
MCH: 28.9 pg (ref 26.0–34.0)
MCHC: 33 g/dL (ref 30.0–36.0)
MCV: 87.5 fL (ref 80.0–100.0)
Monocytes Absolute: 0.5 10*3/uL (ref 0.1–1.0)
Monocytes Relative: 12 %
Neutro Abs: 2 10*3/uL (ref 1.7–7.7)
Neutrophils Relative %: 54 %
Platelet Count: 184 10*3/uL (ref 150–400)
RBC: 3.91 MIL/uL (ref 3.87–5.11)
RDW: 15 % (ref 11.5–15.5)
WBC Count: 3.7 10*3/uL — ABNORMAL LOW (ref 4.0–10.5)
nRBC: 0 % (ref 0.0–0.2)

## 2020-08-18 LAB — TOTAL PROTEIN, URINE DIPSTICK: Protein, ur: NEGATIVE mg/dL

## 2020-08-18 MED ORDER — SODIUM CHLORIDE 0.9% FLUSH
10.0000 mL | INTRAVENOUS | Status: DC | PRN
  Administered 2020-08-18: 10 mL
  Filled 2020-08-18: qty 10

## 2020-08-18 MED ORDER — SODIUM CHLORIDE 0.9 % IV SOLN
10.0000 mg | Freq: Once | INTRAVENOUS | Status: AC
  Administered 2020-08-18: 10 mg via INTRAVENOUS
  Filled 2020-08-18: qty 10

## 2020-08-18 MED ORDER — SODIUM CHLORIDE 0.9 % IV SOLN
200.0000 mg/m2 | Freq: Once | INTRAVENOUS | Status: AC
  Administered 2020-08-18: 320 mg via INTRAVENOUS
  Filled 2020-08-18: qty 16

## 2020-08-18 MED ORDER — ATROPINE SULFATE 1 MG/ML IJ SOLN
INTRAMUSCULAR | Status: AC
  Filled 2020-08-18: qty 1

## 2020-08-18 MED ORDER — PALONOSETRON HCL INJECTION 0.25 MG/5ML
INTRAVENOUS | Status: AC
  Filled 2020-08-18: qty 5

## 2020-08-18 MED ORDER — SODIUM CHLORIDE 0.9 % IV SOLN
100.0000 mg/m2 | Freq: Once | INTRAVENOUS | Status: AC
  Administered 2020-08-18: 160 mg via INTRAVENOUS
  Filled 2020-08-18: qty 8

## 2020-08-18 MED ORDER — SODIUM CHLORIDE 0.9 % IV SOLN
1600.0000 mg/m2 | INTRAVENOUS | Status: DC
  Administered 2020-08-18: 2550 mg via INTRAVENOUS
  Filled 2020-08-18: qty 51

## 2020-08-18 MED ORDER — PALONOSETRON HCL INJECTION 0.25 MG/5ML
0.2500 mg | Freq: Once | INTRAVENOUS | Status: AC
  Administered 2020-08-18: 0.25 mg via INTRAVENOUS

## 2020-08-18 MED ORDER — SODIUM CHLORIDE 0.9 % IV SOLN
5.0000 mg/kg | Freq: Once | INTRAVENOUS | Status: AC
  Administered 2020-08-18: 300 mg via INTRAVENOUS
  Filled 2020-08-18: qty 12

## 2020-08-18 MED ORDER — ATROPINE SULFATE 1 MG/ML IJ SOLN
0.5000 mg | Freq: Once | INTRAMUSCULAR | Status: AC | PRN
  Administered 2020-08-18: 0.5 mg via INTRAVENOUS

## 2020-08-18 MED ORDER — SODIUM CHLORIDE 0.9 % IV SOLN
Freq: Once | INTRAVENOUS | Status: AC
  Filled 2020-08-18: qty 250

## 2020-08-18 NOTE — Progress Notes (Signed)
[place] OFFICE PROGRESS NOTE   Diagnosis: Colon cancer  INTERVAL HISTORY:   Ms. Lengyel completed a cycle of FOLFIRI on 07/27/2020.  No nausea/vomiting or mouth sores.  She had diarrhea beginning on day 3.  The diarrhea resolved when she took Imodium.  She relates weight loss to avoiding sweets in her diet.   Objective:  Vital signs in last 24 hours:  Blood pressure (!) 166/97, pulse 74, temperature (!) 97 F (36.1 C), temperature source Tympanic, resp. rate 15, height 5' 1"  (1.549 m), weight 131 lb 9.6 oz (59.7 kg), SpO2 99 %.    HEENT: No thrush or ulcers Resp: Lungs clear bilaterally Cardio: Regular rate and rhythm GI: No hepatomegaly, nontender Vascular: No leg edema   Portacath/PICC-without erythema  Lab Results:  Lab Results  Component Value Date   WBC 3.7 (L) 08/18/2020   HGB 11.3 (L) 08/18/2020   HCT 34.2 (L) 08/18/2020   MCV 87.5 08/18/2020   PLT 184 08/18/2020   NEUTROABS 2.0 08/18/2020    CMP  Lab Results  Component Value Date   NA 142 07/27/2020   K 4.3 07/27/2020   CL 108 07/27/2020   CO2 27 07/27/2020   GLUCOSE 74 07/27/2020   BUN 14 07/27/2020   CREATININE 0.95 07/27/2020   CALCIUM 9.4 07/27/2020   PROT 6.7 07/27/2020   ALBUMIN 3.3 (L) 07/27/2020   AST 28 07/27/2020   ALT 18 07/27/2020   ALKPHOS 55 07/27/2020   BILITOT 0.5 07/27/2020   GFRNONAA 60 (L) 07/27/2020   GFRAA >60 10/08/2019    Lab Results  Component Value Date   CEA1 3.98 07/27/2020    Medications: I have reviewed the patient's current medications.   Assessment/Plan: 1. Moderately differentiated adenocarcinoma of the cecum, stage III (T4a, N1), status post a laparoscopic assisted right colectomy 10/22/2013  The tumor returned microsatellite stable with normal mismatch repair protein expression   Cycle 1 adjuvant Xeloda 12/25/2013.   Cycle 2 adjuvant Xeloda 01/15/2014.   Cycle 3 adjuvant Xeloda 02/05/2014.   Cycle 4 adjuvant Xeloda  03/18/2014, discontinued after 4 days secondary to diarrhea   Cycle 5 adjuvant Xeloda 04/16/2014, Xeloda dose reduced to 1000 mg in the a.m. and 500 mg in the p.m.   Cycle 6 adjuvant Xeloda 05/07/2014.  Cycle 7 adjuvant Xeloda 05/28/2014.  Cycle 8 adjuvant Xeloda 06/18/2014.  Restaging CTs on 07/14/2014 with no evidence of metastatic colon cancer  Colonoscopy 10/02/2014 with diverticulosis in the sigmoid colon and in the descending colon. One 4 mm polyp in the sigmoid colon with complete resection. Polyp tissue not retrieved. Internal hemorrhoids. Next colonoscopy in 3 years for surveillance.  Restaging CTs 07/16/2015 with 2 new hypodense liver lesions concerning for metastases, no other evidence of disease progression  PET scan 07/28/2015 with a single hypermetabolic right liver lesion, no other evidence of metastatic disease  Ultrasound-guided biopsy of the right liver lesion 07/30/2015 confirmed metastatic colon cancer, MSS, tumor mutation burden 1, KRAS G12D, PIK3CA, PTEN  MRI abdomen 08/20/2015 with similar to slight decrease in size of 2 hepatic metastasis. No new liver lesions or extrahepatic metastatic disease identified.  Radiofrequency ablation of 2 liver lesions 09/25/2015  MRI abdomen 02/02/2016-coagulative necrosis at the 2 ablated liver tumor sites,with no enhancement to suggest residual or recurrent liver tumor. No new liver masses or other findings of metastatic disease in the abdomen.  MRI abdomen 08/10/2016 revealed stable ablation sites, no evidence of progressive metastatic disease  Restaging CT 02/08/2017-stable treated liver lesions, no  evidence of progressive metastatic disease, stable borderline enlarged right inguinal node  CEA with stable mild elevation 02/08/2017, 03/01/2017, 03/29/2017,04/19/2017  CEA further elevated 08/21/2017  CTs 09/05/2017-possible local recurrence of tumor along the ablation site right hepatic lobe lesion. Cephalad to the ablation  site approximately 1.9 x 2.1cmsoft tissue mass progressive compared to the prior exam.  Liver SBRT 10/17/2017, 10/19/2017, 10/23/2017, 10/25/2017, 10/27/2017  CT 02/19/2018-progressive ill-defined areas of low attenuation with enhancement surrounding the segment 6 ablation site, stable segment 8 ablation site enlargement of extracapsular lesion posteriorly, no new hepatic lesions, wall thickening/edema at the distal ileum  CT 05/22/2018-2 new lung nodules suspicious for metastases, stable segment 8 ablation zone, slight decrease in the size of delayed perfusion at the segment 6 ablation zone, posterior extracapsular lesion is slightly smaller  CT chest 07/13/2018-multiple lung nodules consistent with metastatic disease  Cycle 1 Xeloda 07/23/2018  Cycle 2 Xeloda 08/13/2018  Cycle 3 Xeloda 09/03/2018  Cycle 4 Xeloda 09/24/2018  Restaging chest CT 10/12/2018-scattered pulmonary nodules/metastases mildly improved.  Cycle 5 Xeloda 10/15/2018  Cycle 6 Xeloda 11/05/2018  Cycle 7 Xeloda 11/26/2018  Cycle 8 Xeloda 12/17/2018  Cycle 9 Xeloda 01/07/2019  CT chest 01/24/2019-stable pulmonary nodules. No new pulmonary nodules. Stable left hepatic lobe cysts and right hepatic lobe lesions. No new hepatic lesions. Stable right ninth and 10th rib lesions.  Xeloda daily dosing schedule beginning 01/28/2019 due to hand-foot syndrome  CT 05/27/2019-enlargement of lung metastases, similar right hepatic lobe lesion  CT 08/05/2019-enlargement of bilateral lung nodules, no new lesions, stable right hepatic lesion  Cycle 1 FOLFIRI 08/13/2019  Cycle 2 FOLFIRI 09/04/2019, Irinotecan dose reduced due to neutropenia  Cycle 3 FOLFIRI 09/17/2019  Cycle 4 FOLFIRI 10/08/2019, 5-FU and irinotecan dose reduced  CT chest 10/28/2019-bilateral pulmonary metastases with minimal improvement. Stable ablation zone in the posterior right hepatic lobe.  CT chest 03/03/2020-enlargement of pulmonary metastases, decreased right  hepatic ablation site  CT chest 07/07/2020-enlargement of pulmonary metastases, stable ablation site involving right hepatic lobe  Cycle 1 FOLFIRI/Avastin 07/27/2020  Cycle 2 FOLFIRI/Avastin 08/18/2020 2. Right ovary cystadenofibroma, status post a bilateral oophorectomy 10/22/2013 3. Remote history of cervical cancer. 4. Iron deficiency anemia-resolved. 5. Right calf deep vein thrombosis 10/24/2013-she completed 3 months of xarelto. 6. Indeterminate 12 mm hypermetabolic right pelvic density on the staging PET scan 10/04/2013-CT followup recommended per the GI tumor conference 11/13/2013. No longer visualized on a CT 07/14/2014. 7. Soft tissue implant overlying the posterior right liver on the CT 21/30/8657-QIO hypermetabolic on the PET scan 96/29/5284. 8. C. difficile colitis 03/05/2014. She completed a course of Flagyl, recurrent diarrhea beginning 03/21/2014, positive C. difficile toxin 03/26/2014-resumed Flagyl for planned 10 day course.  Vancomycin beginning 04/08/2014.  Taper complete 05/21/2014.  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 Xeloda dose/schedule adjusted beginning 01/28/2019.     Disposition: Ms. Arteaga tolerated the first cycle of FOLFIRI/Avastin well.  She will complete cycle 2 today.  She will use Imodium as needed for diarrhea.  She will call if Imodium does not relieve the diarrhea.  She will return for an office visit in the next cycle of FOLFIRI/Avastin in 3 weeks.  The plan is to schedule a restaging CT after cycle 4.  [name], MD  08/18/2020  11:41 AM

## 2020-08-18 NOTE — Patient Instructions (Signed)
[facility] Discharge Instructions for Patients Receiving Chemotherapy  Today you received the following chemotherapy agents: bevacizumab/irinotecan/leucovorin/fluorouracil.  To help prevent nausea and vomiting after your treatment, we encourage you to take your nausea medication as directed.   If you develop nausea and vomiting that is not controlled by your nausea medication, call the clinic.   BELOW ARE SYMPTOMS THAT SHOULD BE REPORTED IMMEDIATELY:  *FEVER GREATER THAN 100.5 F  *CHILLS WITH OR WITHOUT FEVER  NAUSEA AND VOMITING THAT IS NOT CONTROLLED WITH YOUR NAUSEA MEDICATION  *UNUSUAL SHORTNESS OF BREATH  *UNUSUAL BRUISING OR BLEEDING  TENDERNESS IN MOUTH AND THROAT WITH OR WITHOUT PRESENCE OF ULCERS  *URINARY PROBLEMS  *BOWEL PROBLEMS  UNUSUAL RASH Items with * indicate a potential emergency and should be followed up as soon as possible.  Feel free to call the clinic should you have any questions or concerns. The clinic phone number is [phone].  Please show the CHEMO ALERT CARD at check-in to the Emergency Department and triage nurse.   

## 2020-08-19 ENCOUNTER — Telehealth: Payer: Self-pay | Admitting: Oncology

## 2020-08-19 NOTE — Telephone Encounter (Signed)
Scheduled appointments per 1/4 los. Spoke to patient who is aware of appointments dates and times.  

## 2020-08-20 ENCOUNTER — Inpatient Hospital Stay: Payer: Medicare PPO

## 2020-08-20 ENCOUNTER — Other Ambulatory Visit: Payer: Self-pay

## 2020-08-20 VITALS — BP 175/82 | HR 66 | Temp 98.7°F | Resp 18

## 2020-08-20 DIAGNOSIS — C189 Malignant neoplasm of colon, unspecified: Secondary | ICD-10-CM

## 2020-08-20 DIAGNOSIS — C7801 Secondary malignant neoplasm of right lung: Secondary | ICD-10-CM | POA: Diagnosis not present

## 2020-08-20 DIAGNOSIS — Z8669 Personal history of other diseases of the nervous system and sense organs: Secondary | ICD-10-CM | POA: Diagnosis not present

## 2020-08-20 DIAGNOSIS — C7802 Secondary malignant neoplasm of left lung: Secondary | ICD-10-CM | POA: Diagnosis not present

## 2020-08-20 DIAGNOSIS — C180 Malignant neoplasm of cecum: Secondary | ICD-10-CM | POA: Diagnosis not present

## 2020-08-20 DIAGNOSIS — D709 Neutropenia, unspecified: Secondary | ICD-10-CM | POA: Diagnosis not present

## 2020-08-20 DIAGNOSIS — Z5111 Encounter for antineoplastic chemotherapy: Secondary | ICD-10-CM | POA: Diagnosis not present

## 2020-08-20 MED ORDER — SODIUM CHLORIDE 0.9% FLUSH
10.0000 mL | INTRAVENOUS | Status: DC | PRN
  Administered 2020-08-20: 10 mL
  Filled 2020-08-20: qty 10

## 2020-08-20 MED ORDER — HEPARIN SOD (PORK) LOCK FLUSH 100 UNIT/ML IV SOLN
500.0000 [IU] | Freq: Once | INTRAVENOUS | Status: AC | PRN
  Administered 2020-08-20: 500 [IU]
  Filled 2020-08-20: qty 5

## 2020-08-21 DIAGNOSIS — Z0000 Encounter for general adult medical examination without abnormal findings: Secondary | ICD-10-CM | POA: Diagnosis not present

## 2020-08-27 DIAGNOSIS — C787 Secondary malignant neoplasm of liver and intrahepatic bile duct: Secondary | ICD-10-CM | POA: Diagnosis not present

## 2020-08-27 DIAGNOSIS — C189 Malignant neoplasm of colon, unspecified: Secondary | ICD-10-CM | POA: Diagnosis not present

## 2020-09-06 ENCOUNTER — Other Ambulatory Visit: Payer: Self-pay | Admitting: Oncology

## 2020-09-08 ENCOUNTER — Inpatient Hospital Stay: Payer: Medicare PPO

## 2020-09-08 ENCOUNTER — Encounter: Payer: Self-pay | Admitting: Nurse Practitioner

## 2020-09-08 ENCOUNTER — Other Ambulatory Visit: Payer: Self-pay

## 2020-09-08 ENCOUNTER — Inpatient Hospital Stay: Payer: Medicare PPO | Admitting: Nurse Practitioner

## 2020-09-08 VITALS — BP 172/88 | HR 71 | Temp 97.8°F | Resp 16 | Ht 61.0 in | Wt 128.8 lb

## 2020-09-08 DIAGNOSIS — C189 Malignant neoplasm of colon, unspecified: Secondary | ICD-10-CM

## 2020-09-08 DIAGNOSIS — C7801 Secondary malignant neoplasm of right lung: Secondary | ICD-10-CM | POA: Diagnosis not present

## 2020-09-08 DIAGNOSIS — Z5111 Encounter for antineoplastic chemotherapy: Secondary | ICD-10-CM | POA: Diagnosis not present

## 2020-09-08 DIAGNOSIS — Z95828 Presence of other vascular implants and grafts: Secondary | ICD-10-CM

## 2020-09-08 DIAGNOSIS — D709 Neutropenia, unspecified: Secondary | ICD-10-CM | POA: Diagnosis not present

## 2020-09-08 DIAGNOSIS — C180 Malignant neoplasm of cecum: Secondary | ICD-10-CM | POA: Diagnosis not present

## 2020-09-08 DIAGNOSIS — C787 Secondary malignant neoplasm of liver and intrahepatic bile duct: Secondary | ICD-10-CM

## 2020-09-08 DIAGNOSIS — C7802 Secondary malignant neoplasm of left lung: Secondary | ICD-10-CM | POA: Diagnosis not present

## 2020-09-08 DIAGNOSIS — Z8669 Personal history of other diseases of the nervous system and sense organs: Secondary | ICD-10-CM | POA: Diagnosis not present

## 2020-09-08 LAB — CBC WITH DIFFERENTIAL (CANCER CENTER ONLY)
Abs Immature Granulocytes: 0 10*3/uL (ref 0.00–0.07)
Basophils Absolute: 0 10*3/uL (ref 0.0–0.1)
Basophils Relative: 0 %
Eosinophils Absolute: 0.1 10*3/uL (ref 0.0–0.5)
Eosinophils Relative: 5 %
HCT: 34.9 % — ABNORMAL LOW (ref 36.0–46.0)
Hemoglobin: 11.1 g/dL — ABNORMAL LOW (ref 12.0–15.0)
Immature Granulocytes: 0 %
Lymphocytes Relative: 41 %
Lymphs Abs: 1 10*3/uL (ref 0.7–4.0)
MCH: 28.9 pg (ref 26.0–34.0)
MCHC: 31.8 g/dL (ref 30.0–36.0)
MCV: 90.9 fL (ref 80.0–100.0)
Monocytes Absolute: 0.4 10*3/uL (ref 0.1–1.0)
Monocytes Relative: 17 %
Neutro Abs: 1 10*3/uL — ABNORMAL LOW (ref 1.7–7.7)
Neutrophils Relative %: 37 %
Platelet Count: 188 10*3/uL (ref 150–400)
RBC: 3.84 MIL/uL — ABNORMAL LOW (ref 3.87–5.11)
RDW: 16.6 % — ABNORMAL HIGH (ref 11.5–15.5)
WBC Count: 2.5 10*3/uL — ABNORMAL LOW (ref 4.0–10.5)
nRBC: 0 % (ref 0.0–0.2)

## 2020-09-08 LAB — CMP (CANCER CENTER ONLY)
ALT: 12 U/L (ref 0–44)
AST: 21 U/L (ref 15–41)
Albumin: 3.5 g/dL (ref 3.5–5.0)
Alkaline Phosphatase: 51 U/L (ref 38–126)
Anion gap: 6 (ref 5–15)
BUN: 14 mg/dL (ref 8–23)
CO2: 26 mmol/L (ref 22–32)
Calcium: 8.9 mg/dL (ref 8.9–10.3)
Chloride: 108 mmol/L (ref 98–111)
Creatinine: 0.83 mg/dL (ref 0.44–1.00)
GFR, Estimated: >60 mL/min (ref >60)
Glucose, Bld: 76 mg/dL (ref 70–99)
Potassium: 4.3 mmol/L (ref 3.5–5.1)
Sodium: 140 mmol/L (ref 135–145)
Total Bilirubin: 0.5 mg/dL (ref 0.3–1.2)
Total Protein: 6.5 g/dL (ref 6.5–8.1)

## 2020-09-08 LAB — CEA (IN HOUSE-CHCC): CEA (CHCC-In House): 3.44 ng/mL (ref 0.00–5.00)

## 2020-09-08 MED ORDER — SODIUM CHLORIDE 0.9% FLUSH
10.0000 mL | INTRAVENOUS | Status: DC | PRN
  Administered 2020-09-08: 10 mL
  Filled 2020-09-08: qty 10

## 2020-09-08 NOTE — Progress Notes (Signed)
[place] OFFICE PROGRESS NOTE   Diagnosis: Colon cancer  INTERVAL HISTORY:   Cynthia [name] returns as scheduled.  She completed cycle 2 FOLFIRI/Avastin 08/18/2020.  She denies nausea/vomiting.  No mouth sores.  She developed diarrhea on day 3.  She took about 2 doses of Imodium with good relief.  No hand or foot pain or redness.  No bleeding except a few episodes of blood with nose blowing.  No fever, cough, shortness of breath.  Objective:  Vital signs in last 24 hours:  Blood pressure (!) 172/88, pulse 71, temperature 97.8 F (36.6 C), temperature source Tympanic, resp. rate 16, height 5' 1"  (1.549 m), weight 128 lb 12.8 oz (58.4 kg), SpO2 99 %.    HEENT: No thrush or ulcers. Resp: Lungs clear bilaterally. Cardio: Regular rate and rhythm. GI: No hepatomegaly. Vascular: No leg edema. Skin: Stable skin thickening right lateral chest surgical site.  No erythema. Port-A-Cath without erythema.  Lab Results:  Lab Results  Component Value Date   WBC 2.5 (L) 09/08/2020   HGB 11.1 (L) 09/08/2020   HCT 34.9 (L) 09/08/2020   MCV 90.9 09/08/2020   PLT 188 09/08/2020   NEUTROABS 1.0 (L) 09/08/2020    Imaging:  No results found.  Medications: I have reviewed the patient's current medications.  Assessment/Plan: 1. Moderately differentiated adenocarcinoma of the cecum, stage III (T4a, N1), status post a laparoscopic assisted right colectomy 10/22/2013  The tumor returned microsatellite stable with normal mismatch repair protein expression   Cycle 1 adjuvant Xeloda 12/25/2013.   Cycle 2 adjuvant Xeloda 01/15/2014.   Cycle 3 adjuvant Xeloda 02/05/2014.   Cycle 4 adjuvant Xeloda 03/18/2014, discontinued after 4 days secondary to diarrhea   Cycle 5 adjuvant Xeloda 04/16/2014, Xeloda dose reduced to 1000 mg in the a.m. and 500 mg in the p.m.   Cycle 6 adjuvant Xeloda 05/07/2014.  Cycle 7 adjuvant Xeloda 05/28/2014.  Cycle 8 adjuvant Xeloda  06/18/2014.  Restaging CTs on 07/14/2014 with no evidence of metastatic colon cancer  Colonoscopy 10/02/2014 with diverticulosis in the sigmoid colon and in the descending colon. One 4 mm polyp in the sigmoid colon with complete resection. Polyp tissue not retrieved. Internal hemorrhoids. Next colonoscopy in 3 years for surveillance.  Restaging CTs 07/16/2015 with 2 new hypodense liver lesions concerning for metastases, no other evidence of disease progression  PET scan 07/28/2015 with a single hypermetabolic right liver lesion, no other evidence of metastatic disease  Ultrasound-guided biopsy of the right liver lesion 07/30/2015 confirmed metastatic colon cancer, MSS, tumor mutation burden 1, KRAS G12D, PIK3CA, PTEN  MRI abdomen 08/20/2015 with similar to slight decrease in size of 2 hepatic metastasis. No new liver lesions or extrahepatic metastatic disease identified.  Radiofrequency ablation of 2 liver lesions 09/25/2015  MRI abdomen 02/02/2016-coagulative necrosis at the 2 ablated liver tumor sites,with no enhancement to suggest residual or recurrent liver tumor. No new liver masses or other findings of metastatic disease in the abdomen.  MRI abdomen 08/10/2016 revealed stable ablation sites, no evidence of progressive metastatic disease  Restaging CT 02/08/2017-stable treated liver lesions, no evidence of progressive metastatic disease, stable borderline enlarged right inguinal node  CEA with stable mild elevation 02/08/2017, 03/01/2017, 03/29/2017,04/19/2017  CEA further elevated 08/21/2017  CTs 09/05/2017-possible local recurrence of tumor along the ablation site right hepatic lobe lesion. Cephalad to the ablation site approximately 1.9 x 2.1cmsoft tissue mass progressive compared to the prior exam.  Liver SBRT 10/17/2017, 10/19/2017, 10/23/2017, 10/25/2017, 10/27/2017  CT 02/19/2018-progressive ill-defined areas  of low attenuation with enhancement surrounding the segment 6 ablation  site, stable segment 8 ablation site enlargement of extracapsular lesion posteriorly, no new hepatic lesions, wall thickening/edema at the distal ileum  CT 05/22/2018-2 new lung nodules suspicious for metastases, stable segment 8 ablation zone, slight decrease in the size of delayed perfusion at the segment 6 ablation zone, posterior extracapsular lesion is slightly smaller  CT chest 07/13/2018-multiple lung nodules consistent with metastatic disease  Cycle 1 Xeloda 07/23/2018  Cycle 2 Xeloda 08/13/2018  Cycle 3 Xeloda 09/03/2018  Cycle 4 Xeloda 09/24/2018  Restaging chest CT 10/12/2018-scattered pulmonary nodules/metastases mildly improved.  Cycle 5 Xeloda 10/15/2018  Cycle 6 Xeloda 11/05/2018  Cycle 7 Xeloda 11/26/2018  Cycle 8 Xeloda 12/17/2018  Cycle 9 Xeloda 01/07/2019  CT chest 01/24/2019-stable pulmonary nodules. No new pulmonary nodules. Stable left hepatic lobe cysts and right hepatic lobe lesions. No new hepatic lesions. Stable right ninth and 10th rib lesions.  Xeloda daily dosing schedule beginning 01/28/2019 due to hand-foot syndrome  CT 05/27/2019-enlargement of lung metastases, similar right hepatic lobe lesion  CT 08/05/2019-enlargement of bilateral lung nodules, no new lesions, stable right hepatic lesion  Cycle 1 FOLFIRI 08/13/2019  Cycle 2 FOLFIRI 09/04/2019, Irinotecan dose reduced due to neutropenia  Cycle 3 FOLFIRI 09/17/2019  Cycle 4 FOLFIRI 10/08/2019, 5-FU and irinotecan dose reduced  CT chest 10/28/2019-bilateral pulmonary metastases with minimal improvement. Stable ablation zone in the posterior right hepatic lobe.  CT chest 03/03/2020-enlargement of pulmonary metastases, decreased right hepatic ablation site  CT chest 07/07/2020-enlargement of pulmonary metastases, stable ablation site involving right hepatic lobe  Cycle 1 FOLFIRI/Avastin 07/27/2020  Cycle 2 FOLFIRI/Avastin 08/18/2020  Treatment held 09/08/2020 due to neutropenia 2. Right ovary  cystadenofibroma, status post a bilateral oophorectomy 10/22/2013 3. Remote history of cervical cancer. 4. Iron deficiency anemia-resolved. 5. Right calf deep vein thrombosis 10/24/2013-she completed 3 months of xarelto. 6. Indeterminate 12 mm hypermetabolic right pelvic density on the staging PET scan 10/04/2013-CT followup recommended per the GI tumor conference 11/13/2013. No longer visualized on a CT 07/14/2014. 7. Soft tissue implant overlying the posterior right liver on the CT 02/33/4356-YSH hypermetabolic on the PET scan 68/37/2902. 8. C. difficile colitis 03/05/2014. She completed a course of Flagyl, recurrent diarrhea beginning 03/21/2014, positive C. difficile toxin 03/26/2014-resumed Flagyl for planned 10 day course.  Vancomycin beginning 04/08/2014.  Taper complete 05/21/2014.  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 Xeloda dose/schedule adjusted beginning 01/28/2019.   Disposition: Ms. Witters appears stable.  She has completed 2 cycles of FOLFIRI/Avastin.  She has mild to moderate neutropenia on the CBC from today.  We are holding today's treatment and will reschedule for 1 week.  Irinotecan will be dose reduced.  We also discussed white cell growth factor support which she is not in favor of due to the history of Ethelene Hal.  Neutropenic precautions reviewed.  She understands to contact the office with fever, chills, other signs of infection.  She will return for lab, follow-up, FOLFIRI/Avastin in 1 week.  She will contact the office in the interim as outlined above or with any other problems.  I contacted her daughter by phone and reviewed the above as well.    Ned Card ANP/GNP-BC   09/08/2020  10:39 AM

## 2020-09-09 ENCOUNTER — Telehealth: Payer: Self-pay | Admitting: Nurse Practitioner

## 2020-09-09 NOTE — Telephone Encounter (Signed)
Scheduled appointments per 1/25 los. Called patient, no answer. Left message with appointments date and times. Scheduled patient on 2/3 because of both MD and infusion availability. Okay to schedule this day per Ned Card.

## 2020-09-10 ENCOUNTER — Inpatient Hospital Stay: Payer: Medicare PPO

## 2020-09-17 ENCOUNTER — Inpatient Hospital Stay: Payer: Medicare PPO

## 2020-09-17 ENCOUNTER — Other Ambulatory Visit: Payer: Self-pay

## 2020-09-17 ENCOUNTER — Inpatient Hospital Stay: Payer: Medicare PPO | Attending: Oncology

## 2020-09-17 ENCOUNTER — Inpatient Hospital Stay: Payer: Medicare PPO | Admitting: Oncology

## 2020-09-17 VITALS — BP 162/98 | HR 75 | Temp 98.1°F | Resp 20 | Ht 61.0 in | Wt 128.8 lb

## 2020-09-17 VITALS — BP 146/88

## 2020-09-17 DIAGNOSIS — Z5111 Encounter for antineoplastic chemotherapy: Secondary | ICD-10-CM | POA: Insufficient documentation

## 2020-09-17 DIAGNOSIS — C787 Secondary malignant neoplasm of liver and intrahepatic bile duct: Secondary | ICD-10-CM | POA: Diagnosis not present

## 2020-09-17 DIAGNOSIS — C189 Malignant neoplasm of colon, unspecified: Secondary | ICD-10-CM | POA: Diagnosis not present

## 2020-09-17 DIAGNOSIS — Z5112 Encounter for antineoplastic immunotherapy: Secondary | ICD-10-CM | POA: Insufficient documentation

## 2020-09-17 DIAGNOSIS — C7801 Secondary malignant neoplasm of right lung: Secondary | ICD-10-CM | POA: Diagnosis not present

## 2020-09-17 DIAGNOSIS — Z95828 Presence of other vascular implants and grafts: Secondary | ICD-10-CM | POA: Diagnosis not present

## 2020-09-17 DIAGNOSIS — C180 Malignant neoplasm of cecum: Secondary | ICD-10-CM | POA: Diagnosis not present

## 2020-09-17 DIAGNOSIS — C7802 Secondary malignant neoplasm of left lung: Secondary | ICD-10-CM | POA: Diagnosis not present

## 2020-09-17 LAB — CMP (CANCER CENTER ONLY)
ALT: 15 U/L (ref 0–44)
AST: 25 U/L (ref 15–41)
Albumin: 3.6 g/dL (ref 3.5–5.0)
Alkaline Phosphatase: 46 U/L (ref 38–126)
Anion gap: 7 (ref 5–15)
BUN: 17 mg/dL (ref 8–23)
CO2: 28 mmol/L (ref 22–32)
Calcium: 9 mg/dL (ref 8.9–10.3)
Chloride: 108 mmol/L (ref 98–111)
Creatinine: 0.83 mg/dL (ref 0.44–1.00)
GFR, Estimated: >60 mL/min (ref >60)
Glucose, Bld: 79 mg/dL (ref 70–99)
Potassium: 4.2 mmol/L (ref 3.5–5.1)
Sodium: 143 mmol/L (ref 135–145)
Total Bilirubin: 0.4 mg/dL (ref 0.3–1.2)
Total Protein: 6.7 g/dL (ref 6.5–8.1)

## 2020-09-17 LAB — CBC WITH DIFFERENTIAL (CANCER CENTER ONLY)
Abs Immature Granulocytes: 0.02 10*3/uL (ref 0.00–0.07)
Basophils Absolute: 0 10*3/uL (ref 0.0–0.1)
Basophils Relative: 1 %
Eosinophils Absolute: 0.1 10*3/uL (ref 0.0–0.5)
Eosinophils Relative: 1 %
HCT: 36.3 % (ref 36.0–46.0)
Hemoglobin: 11.5 g/dL — ABNORMAL LOW (ref 12.0–15.0)
Immature Granulocytes: 0 %
Lymphocytes Relative: 24 %
Lymphs Abs: 1.3 10*3/uL (ref 0.7–4.0)
MCH: 29 pg (ref 26.0–34.0)
MCHC: 31.7 g/dL (ref 30.0–36.0)
MCV: 91.4 fL (ref 80.0–100.0)
Monocytes Absolute: 0.5 10*3/uL (ref 0.1–1.0)
Monocytes Relative: 9 %
Neutro Abs: 3.6 10*3/uL (ref 1.7–7.7)
Neutrophils Relative %: 65 %
Platelet Count: 190 10*3/uL (ref 150–400)
RBC: 3.97 MIL/uL (ref 3.87–5.11)
RDW: 16.9 % — ABNORMAL HIGH (ref 11.5–15.5)
WBC Count: 5.6 10*3/uL (ref 4.0–10.5)
nRBC: 0 % (ref 0.0–0.2)

## 2020-09-17 LAB — TOTAL PROTEIN, URINE DIPSTICK: Protein, ur: NEGATIVE mg/dL

## 2020-09-17 MED ORDER — SODIUM CHLORIDE 0.9 % IV SOLN
80.0000 mg/m2 | Freq: Once | INTRAVENOUS | Status: AC
  Administered 2020-09-17: 120 mg via INTRAVENOUS
  Filled 2020-09-17: qty 6

## 2020-09-17 MED ORDER — SODIUM CHLORIDE 0.9% FLUSH
10.0000 mL | INTRAVENOUS | Status: DC | PRN
  Administered 2020-09-17: 10 mL via INTRAVENOUS
  Filled 2020-09-17: qty 10

## 2020-09-17 MED ORDER — SODIUM CHLORIDE 0.9 % IV SOLN
200.0000 mg/m2 | Freq: Once | INTRAVENOUS | Status: AC
  Administered 2020-09-17: 320 mg via INTRAVENOUS
  Filled 2020-09-17: qty 16

## 2020-09-17 MED ORDER — ATROPINE SULFATE 1 MG/ML IJ SOLN
INTRAMUSCULAR | Status: AC
  Filled 2020-09-17: qty 1

## 2020-09-17 MED ORDER — PALONOSETRON HCL INJECTION 0.25 MG/5ML
INTRAVENOUS | Status: AC
  Filled 2020-09-17: qty 5

## 2020-09-17 MED ORDER — SODIUM CHLORIDE 0.9 % IV SOLN
Freq: Once | INTRAVENOUS | Status: AC
  Filled 2020-09-17: qty 250

## 2020-09-17 MED ORDER — HEPARIN SOD (PORK) LOCK FLUSH 100 UNIT/ML IV SOLN
500.0000 [IU] | Freq: Once | INTRAVENOUS | Status: DC | PRN
  Filled 2020-09-17: qty 5

## 2020-09-17 MED ORDER — DEXAMETHASONE SODIUM PHOSPHATE 100 MG/10ML IJ SOLN
10.0000 mg | Freq: Once | INTRAMUSCULAR | Status: AC
  Administered 2020-09-17: 10 mg via INTRAVENOUS
  Filled 2020-09-17: qty 10
  Filled 2020-09-17: qty 1

## 2020-09-17 MED ORDER — ATROPINE SULFATE 1 MG/ML IJ SOLN
0.5000 mg | Freq: Once | INTRAMUSCULAR | Status: AC | PRN
  Administered 2020-09-17: 0.5 mg via INTRAVENOUS

## 2020-09-17 MED ORDER — SODIUM CHLORIDE 0.9 % IV SOLN
1600.0000 mg/m2 | INTRAVENOUS | Status: DC
  Administered 2020-09-17: 2550 mg via INTRAVENOUS
  Filled 2020-09-17: qty 51

## 2020-09-17 MED ORDER — SODIUM CHLORIDE 0.9 % IV SOLN
5.0000 mg/kg | Freq: Once | INTRAVENOUS | Status: AC
Start: 2020-09-17 — End: 2020-09-17
  Administered 2020-09-17: 300 mg via INTRAVENOUS
  Filled 2020-09-17: qty 12

## 2020-09-17 MED ORDER — SODIUM CHLORIDE 0.9 % IV SOLN
Freq: Once | INTRAVENOUS | Status: DC
  Filled 2020-09-17: qty 250

## 2020-09-17 MED ORDER — PALONOSETRON HCL INJECTION 0.25 MG/5ML
0.2500 mg | Freq: Once | INTRAVENOUS | Status: AC
  Administered 2020-09-17: 0.25 mg via INTRAVENOUS

## 2020-09-17 MED ORDER — SODIUM CHLORIDE 0.9% FLUSH
10.0000 mL | INTRAVENOUS | Status: DC | PRN
  Filled 2020-09-17: qty 10

## 2020-09-17 NOTE — Patient Instructions (Signed)
[facility] Discharge Instructions for Patients Receiving Chemotherapy  Today you received the following chemotherapy agents: bevacizumab, irinotecan, leucovorin, 5FU  To help prevent nausea and vomiting after your treatment, we encourage you to take your nausea medication as directed.   If you develop nausea and vomiting that is not controlled by your nausea medication, call the clinic.   BELOW ARE SYMPTOMS THAT SHOULD BE REPORTED IMMEDIATELY:  *FEVER GREATER THAN 100.5 F  *CHILLS WITH OR WITHOUT FEVER  NAUSEA AND VOMITING THAT IS NOT CONTROLLED WITH YOUR NAUSEA MEDICATION  *UNUSUAL SHORTNESS OF BREATH  *UNUSUAL BRUISING OR BLEEDING  TENDERNESS IN MOUTH AND THROAT WITH OR WITHOUT PRESENCE OF ULCERS  *URINARY PROBLEMS  *BOWEL PROBLEMS  UNUSUAL RASH Items with * indicate a potential emergency and should be followed up as soon as possible.  Feel free to call the clinic should you have any questions or concerns. The clinic phone number is [phone].  Please show the CHEMO ALERT CARD at check-in to the Emergency Department and triage nurse.   

## 2020-09-17 NOTE — Progress Notes (Signed)
[facility] OFFICE PROGRESS NOTE   Diagnosis: Colon cancer  INTERVAL HISTORY:   Ms. Vohra completed another treatment with FOLFIRI/Avastin on 08/18/2020.  She reports diarrhea on day 3, improved with Imodium.  Mild bleeding when she blows her nose.  No other complaint.  Chemotherapy was held last week secondary to neutropenia.  Objective:  Vital signs in last 24 hours:  Blood pressure (!) 162/98, pulse 75, temperature 98.1 F (36.7 C), temperature source Tympanic, resp. rate 20, height 5' 1"  (1.549 m), weight 128 lb 12.8 oz (58.4 kg), SpO2 97 %.    HEENT: No thrush or ulcers Resp: Lungs clear bilaterally Cardio: Regular rate and rhythm GI: No hepatosplenomegaly Vascular: No leg edema, the right lower leg is slightly larger than the left side Skin: No evidence of recurrent tumor at the right lateral chest wall  Portacath/PICC-without erythema  Lab Results:  Lab Results  Component Value Date   WBC 5.6 09/17/2020   HGB 11.5 (L) 09/17/2020   HCT 36.3 09/17/2020   MCV 91.4 09/17/2020   PLT 190 09/17/2020   NEUTROABS 3.6 09/17/2020    CMP  Lab Results  Component Value Date   NA 143 09/17/2020   K 4.2 09/17/2020   CL 108 09/17/2020   CO2 28 09/17/2020   GLUCOSE 79 09/17/2020   BUN 17 09/17/2020   CREATININE 0.83 09/17/2020   CALCIUM 9.0 09/17/2020   PROT 6.7 09/17/2020   ALBUMIN 3.6 09/17/2020   AST 25 09/17/2020   ALT 15 09/17/2020   ALKPHOS 46 09/17/2020   BILITOT 0.4 09/17/2020   GFRNONAA >60 09/17/2020   GFRAA >60 10/08/2019    Lab Results  Component Value Date   CEA1 3.44 09/08/2020    Medications: I have reviewed the patient's current medications.   Assessment/Plan: 1. Moderately differentiated adenocarcinoma of the cecum, stage III (T4a, N1), status post a laparoscopic assisted right colectomy 10/22/2013  The tumor returned microsatellite stable with normal mismatch repair protein expression   Cycle 1 adjuvant Xeloda  12/25/2013.   Cycle 2 adjuvant Xeloda 01/15/2014.   Cycle 3 adjuvant Xeloda 02/05/2014.   Cycle 4 adjuvant Xeloda 03/18/2014, discontinued after 4 days secondary to diarrhea   Cycle 5 adjuvant Xeloda 04/16/2014, Xeloda dose reduced to 1000 mg in the a.m. and 500 mg in the p.m.   Cycle 6 adjuvant Xeloda 05/07/2014.  Cycle 7 adjuvant Xeloda 05/28/2014.  Cycle 8 adjuvant Xeloda 06/18/2014.  Restaging CTs on 07/14/2014 with no evidence of metastatic colon cancer  Colonoscopy 10/02/2014 with diverticulosis in the sigmoid colon and in the descending colon. One 4 mm polyp in the sigmoid colon with complete resection. Polyp tissue not retrieved. Internal hemorrhoids. Next colonoscopy in 3 years for surveillance.  Restaging CTs 07/16/2015 with 2 new hypodense liver lesions concerning for metastases, no other evidence of disease progression  PET scan 07/28/2015 with a single hypermetabolic right liver lesion, no other evidence of metastatic disease  Ultrasound-guided biopsy of the right liver lesion 07/30/2015 confirmed metastatic colon cancer, MSS, tumor mutation burden 1, KRAS G12D, PIK3CA, PTEN  MRI abdomen 08/20/2015 with similar to slight decrease in size of 2 hepatic metastasis. No new liver lesions or extrahepatic metastatic disease identified.  Radiofrequency ablation of 2 liver lesions 09/25/2015  MRI abdomen 02/02/2016-coagulative necrosis at the 2 ablated liver tumor sites,with no enhancement to suggest residual or recurrent liver tumor. No new liver masses or other findings of metastatic disease in the abdomen.  MRI abdomen 08/10/2016 revealed stable ablation sites, no  evidence of progressive metastatic disease  Restaging CT 02/08/2017-stable treated liver lesions, no evidence of progressive metastatic disease, stable borderline enlarged right inguinal node  CEA with stable mild elevation 02/08/2017, 03/01/2017, 03/29/2017,04/19/2017  CEA further elevated 08/21/2017  CTs  09/05/2017-possible local recurrence of tumor along the ablation site right hepatic lobe lesion. Cephalad to the ablation site approximately 1.9 x 2.1cmsoft tissue mass progressive compared to the prior exam.  Liver SBRT 10/17/2017, 10/19/2017, 10/23/2017, 10/25/2017, 10/27/2017  CT 02/19/2018-progressive ill-defined areas of low attenuation with enhancement surrounding the segment 6 ablation site, stable segment 8 ablation site enlargement of extracapsular lesion posteriorly, no new hepatic lesions, wall thickening/edema at the distal ileum  CT 05/22/2018-2 new lung nodules suspicious for metastases, stable segment 8 ablation zone, slight decrease in the size of delayed perfusion at the segment 6 ablation zone, posterior extracapsular lesion is slightly smaller  CT chest 07/13/2018-multiple lung nodules consistent with metastatic disease  Cycle 1 Xeloda 07/23/2018  Cycle 2 Xeloda 08/13/2018  Cycle 3 Xeloda 09/03/2018  Cycle 4 Xeloda 09/24/2018  Restaging chest CT 10/12/2018-scattered pulmonary nodules/metastases mildly improved.  Cycle 5 Xeloda 10/15/2018  Cycle 6 Xeloda 11/05/2018  Cycle 7 Xeloda 11/26/2018  Cycle 8 Xeloda 12/17/2018  Cycle 9 Xeloda 01/07/2019  CT chest 01/24/2019-stable pulmonary nodules. No new pulmonary nodules. Stable left hepatic lobe cysts and right hepatic lobe lesions. No new hepatic lesions. Stable right ninth and 10th rib lesions.  Xeloda daily dosing schedule beginning 01/28/2019 due to hand-foot syndrome  CT 05/27/2019-enlargement of lung metastases, similar right hepatic lobe lesion  CT 08/05/2019-enlargement of bilateral lung nodules, no new lesions, stable right hepatic lesion  Cycle 1 FOLFIRI 08/13/2019  Cycle 2 FOLFIRI 09/04/2019, Irinotecan dose reduced due to neutropenia  Cycle 3 FOLFIRI 09/17/2019  Cycle 4 FOLFIRI 10/08/2019, 5-FU and irinotecan dose reduced  CT chest 10/28/2019-bilateral pulmonary metastases with minimal improvement. Stable  ablation zone in the posterior right hepatic lobe.  CT chest 03/03/2020-enlargement of pulmonary metastases, decreased right hepatic ablation site  CT chest 07/07/2020-enlargement of pulmonary metastases, stable ablation site involving right hepatic lobe  Cycle 1 FOLFIRI/Avastin 07/27/2020  Cycle 2 FOLFIRI/Avastin 08/18/2020  Treatment held 09/08/2020 due to neutropenia  Cycle 3 FOLFIRI/Avastin 09/17/2020 (irinotecan dose reduced) 2. Right ovary cystadenofibroma, status post a bilateral oophorectomy 10/22/2013 3. Remote history of cervical cancer. 4. Iron deficiency anemia-resolved. 5. Right calf deep vein thrombosis 10/24/2013-she completed 3 months of xarelto. 6. Indeterminate 12 mm hypermetabolic right pelvic density on the staging PET scan 10/04/2013-CT followup recommended per the GI tumor conference 11/13/2013. No longer visualized on a CT 07/14/2014. 7. Soft tissue implant overlying the posterior right liver on the CT 09/32/3557-DUK hypermetabolic on the PET scan 02/54/2706. 8. C. difficile colitis 03/05/2014. She completed a course of Flagyl, recurrent diarrhea beginning 03/21/2014, positive C. difficile toxin 03/26/2014-resumed Flagyl for planned 10 day course.  Vancomycin beginning 04/08/2014.  Taper complete 05/21/2014.  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 Xeloda dose/schedule adjusted beginning 01/28/2019.     Disposition: Ms. Appelbaum has completed 2 cycles of FOLFIRI/Avastin.  She has tolerated the chemotherapy well.  Chemotherapy was held last week secondary to neutropenia.  I discussed options for managing the neutropenia with Ms. Shidler.  She declines G-CSF support.  The irinotecan will be dose reduced.  The plan is to continue chemotherapy on a 3-week schedule.  She will be referred for a restaging CT after 5 cycles of FOLFIRI/Avastin.  The blood pressure is elevated today.  We will repeat the blood pressure  in the chemotherapy  room.  The plan is to add a second antihypertensive medication for persistent hypertension.  She will return for an office visit and chemotherapy in 3 weeks.  [name], MD  09/17/2020  1:25 PM

## 2020-09-18 ENCOUNTER — Telehealth: Payer: Self-pay | Admitting: Oncology

## 2020-09-18 NOTE — Telephone Encounter (Signed)
Scheduled appointments per 2/3 los. Spoke to patient who is aware of appointments date and times.

## 2020-09-19 ENCOUNTER — Inpatient Hospital Stay: Payer: Medicare PPO

## 2020-09-19 ENCOUNTER — Other Ambulatory Visit: Payer: Self-pay

## 2020-09-19 VITALS — BP 183/98 | HR 63 | Temp 97.0°F | Resp 16

## 2020-09-19 DIAGNOSIS — Z5111 Encounter for antineoplastic chemotherapy: Secondary | ICD-10-CM | POA: Diagnosis not present

## 2020-09-19 DIAGNOSIS — C7802 Secondary malignant neoplasm of left lung: Secondary | ICD-10-CM | POA: Diagnosis not present

## 2020-09-19 DIAGNOSIS — C787 Secondary malignant neoplasm of liver and intrahepatic bile duct: Secondary | ICD-10-CM | POA: Diagnosis not present

## 2020-09-19 DIAGNOSIS — C7801 Secondary malignant neoplasm of right lung: Secondary | ICD-10-CM | POA: Diagnosis not present

## 2020-09-19 DIAGNOSIS — C180 Malignant neoplasm of cecum: Secondary | ICD-10-CM | POA: Diagnosis not present

## 2020-09-19 DIAGNOSIS — Z5112 Encounter for antineoplastic immunotherapy: Secondary | ICD-10-CM | POA: Diagnosis not present

## 2020-09-19 MED ORDER — HEPARIN SOD (PORK) LOCK FLUSH 100 UNIT/ML IV SOLN
500.0000 [IU] | Freq: Once | INTRAVENOUS | Status: AC | PRN
  Administered 2020-09-19: 500 [IU]
  Filled 2020-09-19: qty 5

## 2020-09-19 MED ORDER — SODIUM CHLORIDE 0.9% FLUSH
10.0000 mL | INTRAVENOUS | Status: DC | PRN
  Administered 2020-09-19: 10 mL
  Filled 2020-09-19: qty 10

## 2020-09-19 NOTE — Progress Notes (Signed)
Pt stable at discharge.  

## 2020-09-27 DIAGNOSIS — C189 Malignant neoplasm of colon, unspecified: Secondary | ICD-10-CM | POA: Diagnosis not present

## 2020-09-27 DIAGNOSIS — C787 Secondary malignant neoplasm of liver and intrahepatic bile duct: Secondary | ICD-10-CM | POA: Diagnosis not present

## 2020-09-29 ENCOUNTER — Other Ambulatory Visit: Payer: Medicare PPO

## 2020-09-29 ENCOUNTER — Ambulatory Visit: Payer: Medicare PPO

## 2020-09-29 ENCOUNTER — Ambulatory Visit: Payer: Medicare PPO | Admitting: Oncology

## 2020-10-04 ENCOUNTER — Other Ambulatory Visit: Payer: Self-pay | Admitting: Oncology

## 2020-10-06 ENCOUNTER — Inpatient Hospital Stay: Payer: Medicare PPO | Admitting: Nurse Practitioner

## 2020-10-06 ENCOUNTER — Inpatient Hospital Stay: Payer: Medicare PPO

## 2020-10-06 ENCOUNTER — Other Ambulatory Visit: Payer: Self-pay

## 2020-10-06 ENCOUNTER — Encounter: Payer: Self-pay | Admitting: Nurse Practitioner

## 2020-10-06 VITALS — BP 167/86 | HR 70 | Temp 98.6°F | Resp 19 | Ht 61.0 in | Wt 129.1 lb

## 2020-10-06 DIAGNOSIS — Z5112 Encounter for antineoplastic immunotherapy: Secondary | ICD-10-CM | POA: Diagnosis not present

## 2020-10-06 DIAGNOSIS — C189 Malignant neoplasm of colon, unspecified: Secondary | ICD-10-CM | POA: Diagnosis not present

## 2020-10-06 DIAGNOSIS — C787 Secondary malignant neoplasm of liver and intrahepatic bile duct: Secondary | ICD-10-CM

## 2020-10-06 DIAGNOSIS — Z95828 Presence of other vascular implants and grafts: Secondary | ICD-10-CM

## 2020-10-06 DIAGNOSIS — C7801 Secondary malignant neoplasm of right lung: Secondary | ICD-10-CM | POA: Diagnosis not present

## 2020-10-06 DIAGNOSIS — C7802 Secondary malignant neoplasm of left lung: Secondary | ICD-10-CM | POA: Diagnosis not present

## 2020-10-06 DIAGNOSIS — C180 Malignant neoplasm of cecum: Secondary | ICD-10-CM | POA: Diagnosis not present

## 2020-10-06 DIAGNOSIS — Z5111 Encounter for antineoplastic chemotherapy: Secondary | ICD-10-CM | POA: Diagnosis not present

## 2020-10-06 LAB — CMP (CANCER CENTER ONLY)
ALT: 13 U/L (ref 0–44)
AST: 20 U/L (ref 15–41)
Albumin: 3.5 g/dL (ref 3.5–5.0)
Alkaline Phosphatase: 41 U/L (ref 38–126)
Anion gap: 9 (ref 5–15)
BUN: 16 mg/dL (ref 8–23)
CO2: 26 mmol/L (ref 22–32)
Calcium: 8.7 mg/dL — ABNORMAL LOW (ref 8.9–10.3)
Chloride: 107 mmol/L (ref 98–111)
Creatinine: 0.89 mg/dL (ref 0.44–1.00)
GFR, Estimated: >60 mL/min (ref >60)
Glucose, Bld: 91 mg/dL (ref 70–99)
Potassium: 4.3 mmol/L (ref 3.5–5.1)
Sodium: 142 mmol/L (ref 135–145)
Total Bilirubin: 0.3 mg/dL (ref 0.3–1.2)
Total Protein: 6.5 g/dL (ref 6.5–8.1)

## 2020-10-06 LAB — CBC WITH DIFFERENTIAL (CANCER CENTER ONLY)
Abs Immature Granulocytes: 0.01 10*3/uL (ref 0.00–0.07)
Basophils Absolute: 0 10*3/uL (ref 0.0–0.1)
Basophils Relative: 1 %
Eosinophils Absolute: 0.1 10*3/uL (ref 0.0–0.5)
Eosinophils Relative: 3 %
HCT: 34.2 % — ABNORMAL LOW (ref 36.0–46.0)
Hemoglobin: 11 g/dL — ABNORMAL LOW (ref 12.0–15.0)
Immature Granulocytes: 0 %
Lymphocytes Relative: 30 %
Lymphs Abs: 1 10*3/uL (ref 0.7–4.0)
MCH: 29.1 pg (ref 26.0–34.0)
MCHC: 32.2 g/dL (ref 30.0–36.0)
MCV: 90.5 fL (ref 80.0–100.0)
Monocytes Absolute: 0.5 10*3/uL (ref 0.1–1.0)
Monocytes Relative: 15 %
Neutro Abs: 1.7 10*3/uL (ref 1.7–7.7)
Neutrophils Relative %: 51 %
Platelet Count: 189 10*3/uL (ref 150–400)
RBC: 3.78 MIL/uL — ABNORMAL LOW (ref 3.87–5.11)
RDW: 17.3 % — ABNORMAL HIGH (ref 11.5–15.5)
WBC Count: 3.3 10*3/uL — ABNORMAL LOW (ref 4.0–10.5)
nRBC: 0 % (ref 0.0–0.2)

## 2020-10-06 MED ORDER — ATROPINE SULFATE 1 MG/ML IJ SOLN
INTRAMUSCULAR | Status: AC
  Filled 2020-10-06: qty 1

## 2020-10-06 MED ORDER — SODIUM CHLORIDE 0.9 % IV SOLN
200.0000 mg/m2 | Freq: Once | INTRAVENOUS | Status: AC
  Administered 2020-10-06: 320 mg via INTRAVENOUS
  Filled 2020-10-06: qty 16

## 2020-10-06 MED ORDER — SODIUM CHLORIDE 0.9 % IV SOLN
5.0000 mg/kg | Freq: Once | INTRAVENOUS | Status: AC
  Administered 2020-10-06: 300 mg via INTRAVENOUS
  Filled 2020-10-06: qty 12

## 2020-10-06 MED ORDER — ATROPINE SULFATE 1 MG/ML IJ SOLN: 0.5000 mg | Freq: Once | INTRAMUSCULAR | Status: DC | PRN

## 2020-10-06 MED ORDER — SODIUM CHLORIDE 0.9 % IV SOLN
Freq: Once | INTRAVENOUS | Status: AC
  Filled 2020-10-06: qty 250

## 2020-10-06 MED ORDER — SODIUM CHLORIDE 0.9% FLUSH
10.0000 mL | INTRAVENOUS | Status: DC | PRN
  Administered 2020-10-06: 10 mL
  Filled 2020-10-06: qty 10

## 2020-10-06 MED ORDER — SODIUM CHLORIDE 0.9 % IV SOLN
10.0000 mg | Freq: Once | INTRAVENOUS | Status: AC
  Administered 2020-10-06: 10 mg via INTRAVENOUS
  Filled 2020-10-06: qty 10

## 2020-10-06 MED ORDER — HEPARIN SOD (PORK) LOCK FLUSH 100 UNIT/ML IV SOLN
500.0000 [IU] | Freq: Once | INTRAVENOUS | Status: DC | PRN
  Filled 2020-10-06: qty 5

## 2020-10-06 MED ORDER — PALONOSETRON HCL INJECTION 0.25 MG/5ML
INTRAVENOUS | Status: AC
  Filled 2020-10-06: qty 5

## 2020-10-06 MED ORDER — SODIUM CHLORIDE 0.9% FLUSH
10.0000 mL | INTRAVENOUS | Status: DC | PRN
  Filled 2020-10-06: qty 10

## 2020-10-06 MED ORDER — SODIUM CHLORIDE 0.9 % IV SOLN
80.0000 mg/m2 | Freq: Once | INTRAVENOUS | Status: AC
  Administered 2020-10-06: 120 mg via INTRAVENOUS
  Filled 2020-10-06: qty 6

## 2020-10-06 MED ORDER — PALONOSETRON HCL INJECTION 0.25 MG/5ML
0.2500 mg | Freq: Once | INTRAVENOUS | Status: AC
  Administered 2020-10-06: 0.25 mg via INTRAVENOUS

## 2020-10-06 MED ORDER — FLUOROURACIL CHEMO INJECTION 5 GM/100ML
1600.0000 mg/m2 | INTRAVENOUS | Status: DC
  Administered 2020-10-06: 2550 mg via INTRAVENOUS
  Filled 2020-10-06: qty 51

## 2020-10-06 NOTE — Patient Instructions (Signed)
[facility] Discharge Instructions for Patients Receiving Chemotherapy  Today you received the following chemotherapy agents: bevacizumab, irinotecan, leucovorin, 5FU  To help prevent nausea and vomiting after your treatment, we encourage you to take your nausea medication as directed.   If you develop nausea and vomiting that is not controlled by your nausea medication, call the clinic.   BELOW ARE SYMPTOMS THAT SHOULD BE REPORTED IMMEDIATELY:  *FEVER GREATER THAN 100.5 F  *CHILLS WITH OR WITHOUT FEVER  NAUSEA AND VOMITING THAT IS NOT CONTROLLED WITH YOUR NAUSEA MEDICATION  *UNUSUAL SHORTNESS OF BREATH  *UNUSUAL BRUISING OR BLEEDING  TENDERNESS IN MOUTH AND THROAT WITH OR WITHOUT PRESENCE OF ULCERS  *URINARY PROBLEMS  *BOWEL PROBLEMS  UNUSUAL RASH Items with * indicate a potential emergency and should be followed up as soon as possible.  Feel free to call the clinic should you have any questions or concerns. The clinic phone number is [phone].  Please show the [facility] at check-in to the Emergency Department and triage nurse.

## 2020-10-06 NOTE — Progress Notes (Signed)
[facility] OFFICE PROGRESS NOTE   Diagnosis: Colon cancer  INTERVAL HISTORY:   Ms. Worthey returns as scheduled.  She completed cycle 3 FOLFIRI/Avastin 09/17/2020.  Irinotecan was dose reduced due to neutropenia.  She denies significant nausea/vomiting.  No mouth sores.  She tends to have diarrhea for 3 to 7 days after each treatment.  She notes fairly good control with Imodium.  She has a decreased appetite for 3 to 4 days after each treatment.  Objective:  Vital signs in last 24 hours:  Blood pressure (!) 167/86, pulse 70, temperature 98.6 F (37 C), temperature source Tympanic, resp. rate 19, height 5' 1"  (1.549 m), weight 129 lb 1.6 oz (58.6 kg), SpO2 97 %.    HEENT: No thrush or ulcers. Resp: Lungs clear bilaterally. Cardio: Regular rate and rhythm. GI: Abdomen soft and nontender.  No hepatomegaly. Vascular: Trace bilateral ankle edema.  Calves soft and nontender.  Skin: No evidence of recurrent tumor right lateral chest wall.  Scabbed dry appearing lesion with minimal surrounding erythema right lateral lower thigh region. Port-A-Cath without erythema.  Lab Results:  Lab Results  Component Value Date   WBC 3.3 (L) 10/06/2020   HGB 11.0 (L) 10/06/2020   HCT 34.2 (L) 10/06/2020   MCV 90.5 10/06/2020   PLT 189 10/06/2020   NEUTROABS 1.7 10/06/2020    Imaging:  No results found.  Medications: I have reviewed the patient's current medications.  Assessment/Plan: 1. Moderately differentiated adenocarcinoma of the cecum, stage III (T4a, N1), status post a laparoscopic assisted right colectomy 10/22/2013  The tumor returned microsatellite stable with normal mismatch repair protein expression   Cycle 1 adjuvant Xeloda 12/25/2013.   Cycle 2 adjuvant Xeloda 01/15/2014.   Cycle 3 adjuvant Xeloda 02/05/2014.   Cycle 4 adjuvant Xeloda 03/18/2014, discontinued after 4 days secondary to diarrhea   Cycle 5 adjuvant Xeloda 04/16/2014, Xeloda dose  reduced to 1000 mg in the a.m. and 500 mg in the p.m.   Cycle 6 adjuvant Xeloda 05/07/2014.  Cycle 7 adjuvant Xeloda 05/28/2014.  Cycle 8 adjuvant Xeloda 06/18/2014.  Restaging CTs on 07/14/2014 with no evidence of metastatic colon cancer  Colonoscopy 10/02/2014 with diverticulosis in the sigmoid colon and in the descending colon. One 4 mm polyp in the sigmoid colon with complete resection. Polyp tissue not retrieved. Internal hemorrhoids. Next colonoscopy in 3 years for surveillance.  Restaging CTs 07/16/2015 with 2 new hypodense liver lesions concerning for metastases, no other evidence of disease progression  PET scan 07/28/2015 with a single hypermetabolic right liver lesion, no other evidence of metastatic disease  Ultrasound-guided biopsy of the right liver lesion 07/30/2015 confirmed metastatic colon cancer, MSS, tumor mutation burden 1, KRAS G12D, PIK3CA, PTEN  MRI abdomen 08/20/2015 with similar to slight decrease in size of 2 hepatic metastasis. No new liver lesions or extrahepatic metastatic disease identified.  Radiofrequency ablation of 2 liver lesions 09/25/2015  MRI abdomen 02/02/2016-coagulative necrosis at the 2 ablated liver tumor sites,with no enhancement to suggest residual or recurrent liver tumor. No new liver masses or other findings of metastatic disease in the abdomen.  MRI abdomen 08/10/2016 revealed stable ablation sites, no evidence of progressive metastatic disease  Restaging CT 02/08/2017-stable treated liver lesions, no evidence of progressive metastatic disease, stable borderline enlarged right inguinal node  CEA with stable mild elevation 02/08/2017, 03/01/2017, 03/29/2017,04/19/2017  CEA further elevated 08/21/2017  CTs 09/05/2017-possible local recurrence of tumor along the ablation site right hepatic lobe lesion. Cephalad to the ablation site approximately 1.9  x 2.1cmsoft tissue mass progressive compared to the prior exam.  Liver SBRT 10/17/2017,  10/19/2017, 10/23/2017, 10/25/2017, 10/27/2017  CT 02/19/2018-progressive ill-defined areas of low attenuation with enhancement surrounding the segment 6 ablation site, stable segment 8 ablation site enlargement of extracapsular lesion posteriorly, no new hepatic lesions, wall thickening/edema at the distal ileum  CT 05/22/2018-2 new lung nodules suspicious for metastases, stable segment 8 ablation zone, slight decrease in the size of delayed perfusion at the segment 6 ablation zone, posterior extracapsular lesion is slightly smaller  CT chest 07/13/2018-multiple lung nodules consistent with metastatic disease  Cycle 1 Xeloda 07/23/2018  Cycle 2 Xeloda 08/13/2018  Cycle 3 Xeloda 09/03/2018  Cycle 4 Xeloda 09/24/2018  Restaging chest CT 10/12/2018-scattered pulmonary nodules/metastases mildly improved.  Cycle 5 Xeloda 10/15/2018  Cycle 6 Xeloda 11/05/2018  Cycle 7 Xeloda 11/26/2018  Cycle 8 Xeloda 12/17/2018  Cycle 9 Xeloda 01/07/2019  CT chest 01/24/2019-stable pulmonary nodules. No new pulmonary nodules. Stable left hepatic lobe cysts and right hepatic lobe lesions. No new hepatic lesions. Stable right ninth and 10th rib lesions.  Xeloda daily dosing schedule beginning 01/28/2019 due to hand-foot syndrome  CT 05/27/2019-enlargement of lung metastases, similar right hepatic lobe lesion  CT 08/05/2019-enlargement of bilateral lung nodules, no new lesions, stable right hepatic lesion  Cycle 1 FOLFIRI 08/13/2019  Cycle 2 FOLFIRI 09/04/2019, Irinotecan dose reduced due to neutropenia  Cycle 3 FOLFIRI 09/17/2019  Cycle 4 FOLFIRI 10/08/2019, 5-FU and irinotecan dose reduced  CT chest 10/28/2019-bilateral pulmonary metastases with minimal improvement. Stable ablation zone in the posterior right hepatic lobe.  CT chest 03/03/2020-enlargement of pulmonary metastases, decreased right hepatic ablation site  CT chest 07/07/2020-enlargement of pulmonary metastases, stable ablation site involving  right hepatic lobe  Cycle 1 FOLFIRI/Avastin12/13/2021  Cycle 2 FOLFIRI/Avastin 08/18/2020  Treatment held 09/08/2020 due to neutropenia  Cycle 3 FOLFIRI/Avastin 09/17/2020 (irinotecan dose reduced due to neutropenia)  Cycle 4 FOLFIRI/Avastin 10/06/2020 2. Right ovary cystadenofibroma, status post a bilateral oophorectomy 10/22/2013 3. Remote history of cervical cancer. 4. Iron deficiency anemia-resolved. 5. Right calf deep vein thrombosis 10/24/2013-she completed 3 months of xarelto. 6. Indeterminate 12 mm hypermetabolic right pelvic density on the staging PET scan 10/04/2013-CT followup recommended per the GI tumor conference 11/13/2013. No longer visualized on a CT 07/14/2014. 7. Soft tissue implant overlying the posterior right liver on the CT 06/11/2535-UYQ hypermetabolic on the PET scan 03/47/4259. 8. C. difficile colitis 03/05/2014. She completed a course of Flagyl, recurrent diarrhea beginning 03/21/2014, positive C. difficile toxin 03/26/2014-resumed Flagyl for planned 10 day course.  Vancomycin beginning 04/08/2014.  Taper complete 05/21/2014.  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 Xeloda dose/schedule adjusted beginning 01/28/2019.  Disposition: Ms. Janes appears unchanged.  She has completed 3 cycles of FOLFIRI/Avastin.  Plan to proceed with cycle 4 today as scheduled.  Restaging CTs after cycle 5.  We reviewed the CBC from today.  Counts adequate to proceed with treatment.  The white count is mildly low, ANC low end of the normal range.  She understands to contact the office with fever, chills, other signs of infection.  Blood pressure is elevated today.  This will be repeated in the infusion room prior to Avastin.  Toprol-XL dose was recently increased.  She will return for lab, follow-up, cycle 5 FOLFIRI/Avastin in 3 weeks.  She will contact the office in the interim as outlined above or with any other problems.    Ned Card  ANP/GNP-BC   10/06/2020  11:51 AM

## 2020-10-07 ENCOUNTER — Telehealth: Payer: Self-pay | Admitting: Nurse Practitioner

## 2020-10-07 NOTE — Telephone Encounter (Signed)
Scheduled appointments per 2/22 los. Spoke to patient who is aware of appointments dates and times.  

## 2020-10-08 ENCOUNTER — Other Ambulatory Visit: Payer: Self-pay

## 2020-10-08 ENCOUNTER — Inpatient Hospital Stay: Payer: Medicare PPO

## 2020-10-08 VITALS — BP 186/88 | HR 58 | Temp 98.3°F | Resp 18

## 2020-10-08 DIAGNOSIS — C180 Malignant neoplasm of cecum: Secondary | ICD-10-CM | POA: Diagnosis not present

## 2020-10-08 DIAGNOSIS — C787 Secondary malignant neoplasm of liver and intrahepatic bile duct: Secondary | ICD-10-CM | POA: Diagnosis not present

## 2020-10-08 DIAGNOSIS — C189 Malignant neoplasm of colon, unspecified: Secondary | ICD-10-CM

## 2020-10-08 DIAGNOSIS — C7802 Secondary malignant neoplasm of left lung: Secondary | ICD-10-CM | POA: Diagnosis not present

## 2020-10-08 DIAGNOSIS — Z5112 Encounter for antineoplastic immunotherapy: Secondary | ICD-10-CM | POA: Diagnosis not present

## 2020-10-08 DIAGNOSIS — Z5111 Encounter for antineoplastic chemotherapy: Secondary | ICD-10-CM | POA: Diagnosis not present

## 2020-10-08 DIAGNOSIS — C7801 Secondary malignant neoplasm of right lung: Secondary | ICD-10-CM | POA: Diagnosis not present

## 2020-10-08 MED ORDER — HEPARIN SOD (PORK) LOCK FLUSH 100 UNIT/ML IV SOLN
500.0000 [IU] | Freq: Once | INTRAVENOUS | Status: AC | PRN
  Administered 2020-10-08: 500 [IU]
  Filled 2020-10-08: qty 5

## 2020-10-08 MED ORDER — SODIUM CHLORIDE 0.9% FLUSH
10.0000 mL | INTRAVENOUS | Status: DC | PRN
  Administered 2020-10-08: 10 mL
  Filled 2020-10-08: qty 10

## 2020-10-08 NOTE — Patient Instructions (Signed)
Implanted Port Insertion, Care After This sheet gives you information about how to care for yourself after your procedure. Your health care provider may also give you more specific instructions. If you have problems or questions, contact your health care provider. What can I expect after the procedure? After the procedure, it is common to have:  Discomfort at the port insertion site.  Bruising on the skin over the port. This should improve over 3-4 days. Follow these instructions at home: Port care  After your port is placed, you will get a manufacturer's information card. The card has information about your port. Keep this card with you at all times.  Take care of the port as told by your health care provider. Ask your health care provider if you or a family member can get training for taking care of the port at home. A home health care nurse may also take care of the port.  Make sure to remember what type of port you have. Incision care  Follow instructions from your health care provider about how to take care of your port insertion site. Make sure you: ? Wash your hands with soap and water before and after you change your bandage (dressing). If soap and water are not available, use hand sanitizer. ? Change your dressing as told by your health care provider. ? Leave stitches (sutures), skin glue, or adhesive strips in place. These skin closures may need to stay in place for 2 weeks or longer. If adhesive strip edges start to loosen and curl up, you may trim the loose edges. Do not remove adhesive strips completely unless your health care provider tells you to do that.  Check your port insertion site every day for signs of infection. Check for: ? Redness, swelling, or pain. ? Fluid or blood. ? Warmth. ? Pus or a bad smell.      Activity  Return to your normal activities as told by your health care provider. Ask your health care provider what activities are safe for you.  Do not  lift anything that is heavier than 10 lb (4.5 kg), or the limit that you are told, until your health care provider says that it is safe. General instructions  Take over-the-counter and prescription medicines only as told by your health care provider.  Do not take baths, swim, or use a hot tub until your health care provider approves. Ask your health care provider if you may take showers. You may only be allowed to take sponge baths.  Do not drive for 24 hours if you were given a sedative during your procedure.  Wear a medical alert bracelet in case of an emergency. This will tell any health care providers that you have a port.  Keep all follow-up visits as told by your health care provider. This is important. Contact a health care provider if:  You cannot flush your port with saline as directed, or you cannot draw blood from the port.  You have a fever or chills.  You have redness, swelling, or pain around your port insertion site.  You have fluid or blood coming from your port insertion site.  Your port insertion site feels warm to the touch.  You have pus or a bad smell coming from the port insertion site. Get help right away if:  You have chest pain or shortness of breath.  You have bleeding from your port that you cannot control. Summary  Take care of the port as told by your   health care provider. Keep the manufacturer's information card with you at all times.  Change your dressing as told by your health care provider.  Contact a health care provider if you have a fever or chills or if you have redness, swelling, or pain around your port insertion site.  Keep all follow-up visits as told by your health care provider. This information is not intended to replace advice given to you by your health care provider. Make sure you discuss any questions you have with your health care provider. Document Revised: 02/27/2018 Document Reviewed: 02/27/2018 Elsevier Patient Education   2021 [employer]  

## 2020-10-25 ENCOUNTER — Other Ambulatory Visit: Payer: Self-pay | Admitting: Oncology

## 2020-10-26 MED FILL — Dexamethasone Sodium Phosphate Inj 100 MG/10ML: INTRAMUSCULAR | Qty: 1 | Status: AC

## 2020-10-27 ENCOUNTER — Telehealth: Payer: Self-pay | Admitting: Nurse Practitioner

## 2020-10-27 ENCOUNTER — Other Ambulatory Visit: Payer: Self-pay

## 2020-10-27 ENCOUNTER — Inpatient Hospital Stay: Payer: Medicare PPO

## 2020-10-27 ENCOUNTER — Inpatient Hospital Stay: Payer: Medicare PPO | Admitting: Nurse Practitioner

## 2020-10-27 ENCOUNTER — Ambulatory Visit: Payer: Medicare PPO | Admitting: Oncology

## 2020-10-27 ENCOUNTER — Encounter: Payer: Self-pay | Admitting: Nurse Practitioner

## 2020-10-27 ENCOUNTER — Inpatient Hospital Stay: Payer: Medicare PPO | Attending: Oncology

## 2020-10-27 VITALS — BP 179/93 | HR 65 | Temp 96.6°F | Resp 18 | Ht 61.0 in | Wt 128.6 lb

## 2020-10-27 DIAGNOSIS — C189 Malignant neoplasm of colon, unspecified: Secondary | ICD-10-CM

## 2020-10-27 DIAGNOSIS — C180 Malignant neoplasm of cecum: Secondary | ICD-10-CM | POA: Insufficient documentation

## 2020-10-27 DIAGNOSIS — C7801 Secondary malignant neoplasm of right lung: Secondary | ICD-10-CM | POA: Insufficient documentation

## 2020-10-27 DIAGNOSIS — C787 Secondary malignant neoplasm of liver and intrahepatic bile duct: Secondary | ICD-10-CM | POA: Insufficient documentation

## 2020-10-27 DIAGNOSIS — C7802 Secondary malignant neoplasm of left lung: Secondary | ICD-10-CM | POA: Diagnosis not present

## 2020-10-27 DIAGNOSIS — D709 Neutropenia, unspecified: Secondary | ICD-10-CM | POA: Insufficient documentation

## 2020-10-27 LAB — CBC WITH DIFFERENTIAL (CANCER CENTER ONLY)
Abs Immature Granulocytes: 0 10*3/uL (ref 0.00–0.07)
Basophils Absolute: 0 10*3/uL (ref 0.0–0.1)
Basophils Relative: 1 %
Eosinophils Absolute: 0.1 10*3/uL (ref 0.0–0.5)
Eosinophils Relative: 4 %
HCT: 34.9 % — ABNORMAL LOW (ref 36.0–46.0)
Hemoglobin: 11 g/dL — ABNORMAL LOW (ref 12.0–15.0)
Immature Granulocytes: 0 %
Lymphocytes Relative: 36 %
Lymphs Abs: 1 10*3/uL (ref 0.7–4.0)
MCH: 29.3 pg (ref 26.0–34.0)
MCHC: 31.5 g/dL (ref 30.0–36.0)
MCV: 93.1 fL (ref 80.0–100.0)
Monocytes Absolute: 0.4 10*3/uL (ref 0.1–1.0)
Monocytes Relative: 14 %
Neutro Abs: 1.3 10*3/uL — ABNORMAL LOW (ref 1.7–7.7)
Neutrophils Relative %: 45 %
Platelet Count: 172 10*3/uL (ref 150–400)
RBC: 3.75 MIL/uL — ABNORMAL LOW (ref 3.87–5.11)
RDW: 17.3 % — ABNORMAL HIGH (ref 11.5–15.5)
WBC Count: 2.9 10*3/uL — ABNORMAL LOW (ref 4.0–10.5)
nRBC: 0 % (ref 0.0–0.2)

## 2020-10-27 LAB — CMP (CANCER CENTER ONLY)
ALT: 13 U/L (ref 0–44)
AST: 21 U/L (ref 15–41)
Albumin: 3.4 g/dL — ABNORMAL LOW (ref 3.5–5.0)
Alkaline Phosphatase: 45 U/L (ref 38–126)
Anion gap: 8 (ref 5–15)
BUN: 13 mg/dL (ref 8–23)
CO2: 27 mmol/L (ref 22–32)
Calcium: 8.7 mg/dL — ABNORMAL LOW (ref 8.9–10.3)
Chloride: 108 mmol/L (ref 98–111)
Creatinine: 0.82 mg/dL (ref 0.44–1.00)
GFR, Estimated: >60 mL/min (ref >60)
Glucose, Bld: 84 mg/dL (ref 70–99)
Potassium: 4 mmol/L (ref 3.5–5.1)
Sodium: 143 mmol/L (ref 135–145)
Total Bilirubin: 0.4 mg/dL (ref 0.3–1.2)
Total Protein: 6.3 g/dL — ABNORMAL LOW (ref 6.5–8.1)

## 2020-10-27 LAB — CEA (IN HOUSE-CHCC): CEA (CHCC-In House): 3.73 ng/mL (ref 0.00–5.00)

## 2020-10-27 LAB — TOTAL PROTEIN, URINE DIPSTICK: Protein, ur: NEGATIVE mg/dL

## 2020-10-27 NOTE — Patient Instructions (Signed)
Implanted Port Insertion, Care After This sheet gives you information about how to care for yourself after your procedure. Your health care provider may also give you more specific instructions. If you have problems or questions, contact your health care provider. What can I expect after the procedure? After the procedure, it is common to have:  Discomfort at the port insertion site.  Bruising on the skin over the port. This should improve over 3-4 days. Follow these instructions at home: Port care  After your port is placed, you will get a manufacturer's information card. The card has information about your port. Keep this card with you at all times.  Take care of the port as told by your health care provider. Ask your health care provider if you or a family member can get training for taking care of the port at home. A home health care nurse may also take care of the port.  Make sure to remember what type of port you have. Incision care  Follow instructions from your health care provider about how to take care of your port insertion site. Make sure you: ? Wash your hands with soap and water before and after you change your bandage (dressing). If soap and water are not available, use hand sanitizer. ? Change your dressing as told by your health care provider. ? Leave stitches (sutures), skin glue, or adhesive strips in place. These skin closures may need to stay in place for 2 weeks or longer. If adhesive strip edges start to loosen and curl up, you may trim the loose edges. Do not remove adhesive strips completely unless your health care provider tells you to do that.  Check your port insertion site every day for signs of infection. Check for: ? Redness, swelling, or pain. ? Fluid or blood. ? Warmth. ? Pus or a bad smell.      Activity  Return to your normal activities as told by your health care provider. Ask your health care provider what activities are safe for you.  Do not  lift anything that is heavier than 10 lb (4.5 kg), or the limit that you are told, until your health care provider says that it is safe. General instructions  Take over-the-counter and prescription medicines only as told by your health care provider.  Do not take baths, swim, or use a hot tub until your health care provider approves. Ask your health care provider if you may take showers. You may only be allowed to take sponge baths.  Do not drive for 24 hours if you were given a sedative during your procedure.  Wear a medical alert bracelet in case of an emergency. This will tell any health care providers that you have a port.  Keep all follow-up visits as told by your health care provider. This is important. Contact a health care provider if:  You cannot flush your port with saline as directed, or you cannot draw blood from the port.  You have a fever or chills.  You have redness, swelling, or pain around your port insertion site.  You have fluid or blood coming from your port insertion site.  Your port insertion site feels warm to the touch.  You have pus or a bad smell coming from the port insertion site. Get help right away if:  You have chest pain or shortness of breath.  You have bleeding from your port that you cannot control. Summary  Take care of the port as told by your   health care provider. Keep the manufacturer's information card with you at all times.  Change your dressing as told by your health care provider.  Contact a health care provider if you have a fever or chills or if you have redness, swelling, or pain around your port insertion site.  Keep all follow-up visits as told by your health care provider. This information is not intended to replace advice given to you by your health care provider. Make sure you discuss any questions you have with your health care provider. Document Revised: 02/27/2018 Document Reviewed: 02/27/2018 Elsevier Patient Education   2021 [employer]  

## 2020-10-27 NOTE — Telephone Encounter (Signed)
Called pt per 3/15 los - left  Message for patient with appt date and time

## 2020-10-27 NOTE — Progress Notes (Signed)
[place] OFFICE PROGRESS NOTE   Diagnosis: Colon cancer  INTERVAL HISTORY:   Cynthia [name] returns as scheduled.  She completed cycle 4 FOLFIRI/Avastin 10/06/2020.  She denies nausea/vomiting.  No mouth sores.  Diarrhea controlled with Imodium.  No bleeding.  No fever or shortness of breath.  Slight cough, "not bothersome".  Energy level is poor.  She feels her quality of life is being adversely affected by the chemotherapy.  Objective:  Vital signs in last 24 hours:  Blood pressure (!) 179/93, pulse 65, temperature (!) 96.6 F (35.9 C), temperature source Tympanic, resp. rate 18, height 5' 1"  (1.549 m), weight 128 lb 9.6 oz (58.3 kg), SpO2 98 %.    Resp: Lungs clear bilaterally. Cardio: Regular rate and rhythm. GI: Abdomen soft and nontender. Vascular: No leg edema. Neuro: Alert and oriented. Port-A-Cath without erythema.  Lab Results:  Lab Results  Component Value Date   WBC 2.9 (L) 10/27/2020   HGB 11.0 (L) 10/27/2020   HCT 34.9 (L) 10/27/2020   MCV 93.1 10/27/2020   PLT 172 10/27/2020   NEUTROABS 1.3 (L) 10/27/2020    Imaging:  No results found.  Medications: I have reviewed the patient's current medications.  Assessment/Plan: 1. Moderately differentiated adenocarcinoma of the cecum, stage III (T4a, N1), status post a laparoscopic assisted right colectomy 10/22/2013  The tumor returned microsatellite stable with normal mismatch repair protein expression   Cycle 1 adjuvant Xeloda 12/25/2013.   Cycle 2 adjuvant Xeloda 01/15/2014.   Cycle 3 adjuvant Xeloda 02/05/2014.   Cycle 4 adjuvant Xeloda 03/18/2014, discontinued after 4 days secondary to diarrhea   Cycle 5 adjuvant Xeloda 04/16/2014, Xeloda dose reduced to 1000 mg in the a.m. and 500 mg in the p.m.   Cycle 6 adjuvant Xeloda 05/07/2014.  Cycle 7 adjuvant Xeloda 05/28/2014.  Cycle 8 adjuvant Xeloda 06/18/2014.  Restaging CTs on 07/14/2014 with no evidence of metastatic colon  cancer  Colonoscopy 10/02/2014 with diverticulosis in the sigmoid colon and in the descending colon. One 4 mm polyp in the sigmoid colon with complete resection. Polyp tissue not retrieved. Internal hemorrhoids. Next colonoscopy in 3 years for surveillance.  Restaging CTs 07/16/2015 with 2 new hypodense liver lesions concerning for metastases, no other evidence of disease progression  PET scan 07/28/2015 with a single hypermetabolic right liver lesion, no other evidence of metastatic disease  Ultrasound-guided biopsy of the right liver lesion 07/30/2015 confirmed metastatic colon cancer, MSS, tumor mutation burden 1, KRAS G12D, PIK3CA, PTEN  MRI abdomen 08/20/2015 with similar to slight decrease in size of 2 hepatic metastasis. No new liver lesions or extrahepatic metastatic disease identified.  Radiofrequency ablation of 2 liver lesions 09/25/2015  MRI abdomen 02/02/2016-coagulative necrosis at the 2 ablated liver tumor sites,with no enhancement to suggest residual or recurrent liver tumor. No new liver masses or other findings of metastatic disease in the abdomen.  MRI abdomen 08/10/2016 revealed stable ablation sites, no evidence of progressive metastatic disease  Restaging CT 02/08/2017-stable treated liver lesions, no evidence of progressive metastatic disease, stable borderline enlarged right inguinal node  CEA with stable mild elevation 02/08/2017, 03/01/2017, 03/29/2017,04/19/2017  CEA further elevated 08/21/2017  CTs 09/05/2017-possible local recurrence of tumor along the ablation site right hepatic lobe lesion. Cephalad to the ablation site approximately 1.9 x 2.1cmsoft tissue mass progressive compared to the prior exam.  Liver SBRT 10/17/2017, 10/19/2017, 10/23/2017, 10/25/2017, 10/27/2017  CT 02/19/2018-progressive ill-defined areas of low attenuation with enhancement surrounding the segment 6 ablation site, stable segment 8 ablation site  enlargement of extracapsular lesion  posteriorly, no new hepatic lesions, wall thickening/edema at the distal ileum  CT 05/22/2018-2 new lung nodules suspicious for metastases, stable segment 8 ablation zone, slight decrease in the size of delayed perfusion at the segment 6 ablation zone, posterior extracapsular lesion is slightly smaller  CT chest 07/13/2018-multiple lung nodules consistent with metastatic disease  Cycle 1 Xeloda 07/23/2018  Cycle 2 Xeloda 08/13/2018  Cycle 3 Xeloda 09/03/2018  Cycle 4 Xeloda 09/24/2018  Restaging chest CT 10/12/2018-scattered pulmonary nodules/metastases mildly improved.  Cycle 5 Xeloda 10/15/2018  Cycle 6 Xeloda 11/05/2018  Cycle 7 Xeloda 11/26/2018  Cycle 8 Xeloda 12/17/2018  Cycle 9 Xeloda 01/07/2019  CT chest 01/24/2019-stable pulmonary nodules. No new pulmonary nodules. Stable left hepatic lobe cysts and right hepatic lobe lesions. No new hepatic lesions. Stable right ninth and 10th rib lesions.  Xeloda daily dosing schedule beginning 01/28/2019 due to hand-foot syndrome  CT 05/27/2019-enlargement of lung metastases, similar right hepatic lobe lesion  CT 08/05/2019-enlargement of bilateral lung nodules, no new lesions, stable right hepatic lesion  Cycle 1 FOLFIRI 08/13/2019  Cycle 2 FOLFIRI 09/04/2019, Irinotecan dose reduced due to neutropenia  Cycle 3 FOLFIRI 09/17/2019  Cycle 4 FOLFIRI 10/08/2019, 5-FU and irinotecan dose reduced  CT chest 10/28/2019-bilateral pulmonary metastases with minimal improvement. Stable ablation zone in the posterior right hepatic lobe.  CT chest 03/03/2020-enlargement of pulmonary metastases, decreased right hepatic ablation site  CT chest 07/07/2020-enlargement of pulmonary metastases, stable ablation site involving right hepatic lobe  Cycle 1 FOLFIRI/Avastin12/13/2021  Cycle 2 FOLFIRI/Avastin 08/18/2020  Treatment held 09/08/2020 due to neutropenia  Cycle 3 FOLFIRI/Avastin 09/17/2020 (irinotecan dose reduced due to neutropenia)  Cycle 4  FOLFIRI/Avastin 10/06/2020  Chemotherapy discontinued per patient request 2. Right ovary cystadenofibroma, status post a bilateral oophorectomy 10/22/2013 3. Remote history of cervical cancer. 4. Iron deficiency anemia-resolved. 5. Right calf deep vein thrombosis 10/24/2013-she completed 3 months of xarelto. 6. Indeterminate 12 mm hypermetabolic right pelvic density on the staging PET scan 10/04/2013-CT followup recommended per the GI tumor conference 11/13/2013. No longer visualized on a CT 07/14/2014. 7. Soft tissue implant overlying the posterior right liver on the CT 37/90/2409-BDZ hypermetabolic on the PET scan 32/99/2426. 8. C. difficile colitis 03/05/2014. She completed a course of Flagyl, recurrent diarrhea beginning 03/21/2014, positive C. difficile toxin 03/26/2014-resumed Flagyl for planned 10 day course.  Vancomycin beginning 04/08/2014.  Taper complete 05/21/2014.  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 Xeloda dose/schedule adjusted beginning 01/28/2019.   Disposition: Cynthia [name] appears stable.  She has completed 4 cycles of FOLFIRI/Avastin.  We discussed proceeding with cycle 5 today and referring for a restaging CT scan of the chest prior to her next visit.  She declines further chemotherapy.  She feels the chemotherapy is causing her quality of life to be poor.  She would like to go ahead with the restaging chest CT which we will schedule for about 2 weeks.  We reviewed the CBC from today.  She has mild neutropenia.  She understands to contact the office with fever, chills, other signs of infection.  She will return for a follow-up visit in 3 weeks, restaging chest CT in 2 weeks.  We are available to see her sooner if needed.    Ned Card ANP/GNP-BC   10/27/2020  12:12 PM

## 2020-11-14 ENCOUNTER — Other Ambulatory Visit: Payer: Self-pay | Admitting: Oncology

## 2020-11-16 ENCOUNTER — Ambulatory Visit (HOSPITAL_BASED_OUTPATIENT_CLINIC_OR_DEPARTMENT_OTHER)
Admission: RE | Admit: 2020-11-16 | Discharge: 2020-11-16 | Disposition: A | Discharge status: Home / Self Care | Payer: Medicare PPO | Source: Ambulatory Visit | Attending: Nurse Practitioner | Admitting: Nurse Practitioner

## 2020-11-16 ENCOUNTER — Other Ambulatory Visit: Payer: Self-pay

## 2020-11-16 DIAGNOSIS — I2510 Atherosclerotic heart disease of native coronary artery without angina pectoris: Secondary | ICD-10-CM | POA: Diagnosis not present

## 2020-11-16 DIAGNOSIS — I700 Atherosclerosis of aorta: Secondary | ICD-10-CM | POA: Diagnosis not present

## 2020-11-16 DIAGNOSIS — C7802 Secondary malignant neoplasm of left lung: Secondary | ICD-10-CM | POA: Insufficient documentation

## 2020-11-16 DIAGNOSIS — Z0389 Encounter for observation for other suspected diseases and conditions ruled out: Secondary | ICD-10-CM | POA: Diagnosis not present

## 2020-11-16 DIAGNOSIS — I712 Thoracic aortic aneurysm, without rupture: Secondary | ICD-10-CM | POA: Diagnosis not present

## 2020-11-16 DIAGNOSIS — C787 Secondary malignant neoplasm of liver and intrahepatic bile duct: Secondary | ICD-10-CM | POA: Insufficient documentation

## 2020-11-16 DIAGNOSIS — C7801 Secondary malignant neoplasm of right lung: Secondary | ICD-10-CM | POA: Diagnosis not present

## 2020-11-16 DIAGNOSIS — C189 Malignant neoplasm of colon, unspecified: Secondary | ICD-10-CM | POA: Diagnosis not present

## 2020-11-17 ENCOUNTER — Ambulatory Visit: Payer: Medicare PPO | Admitting: Oncology

## 2020-11-19 ENCOUNTER — Inpatient Hospital Stay: Payer: Medicare PPO | Attending: Oncology | Admitting: Oncology

## 2020-11-19 ENCOUNTER — Other Ambulatory Visit: Payer: Self-pay

## 2020-11-19 VITALS — BP 168/92 | HR 67 | Temp 97.8°F | Resp 18 | Ht 61.0 in | Wt 125.0 lb

## 2020-11-19 DIAGNOSIS — C180 Malignant neoplasm of cecum: Secondary | ICD-10-CM | POA: Insufficient documentation

## 2020-11-19 DIAGNOSIS — C7801 Secondary malignant neoplasm of right lung: Secondary | ICD-10-CM | POA: Diagnosis not present

## 2020-11-19 DIAGNOSIS — C7802 Secondary malignant neoplasm of left lung: Secondary | ICD-10-CM | POA: Diagnosis not present

## 2020-11-19 DIAGNOSIS — C189 Malignant neoplasm of colon, unspecified: Secondary | ICD-10-CM | POA: Diagnosis not present

## 2020-11-19 DIAGNOSIS — C787 Secondary malignant neoplasm of liver and intrahepatic bile duct: Secondary | ICD-10-CM

## 2020-11-19 NOTE — Progress Notes (Signed)
[name] OFFICE PROGRESS NOTE   Diagnosis: Colon cancer  INTERVAL HISTORY:   Cynthia [name] returns for a scheduled visit.  She feels well.  No complaint.  She last received chemotherapy on 10/06/2020.  Her energy level has improved while off of chemotherapy.  Objective:  Vital signs in last 24 hours:  Blood pressure (!) 168/92, pulse 67, temperature 97.8 F (36.6 C), temperature source Tympanic, resp. rate 18, height 5' 1"  (1.549 m), weight 125 lb (56.7 kg), SpO2 98 %.    Resp: End inspiratory rhonchi at the right posterior base, no respiratory distress Cardio: Regular rate and rhythm GI: No hepatosplenomegaly, nontender Vascular: Trace lower leg edema bilaterally  Skin: No evidence for progressive tumor at the right posterior lateral chest wall scar  Portacath/PICC-without erythema  Lab Results:  Lab Results  Component Value Date   WBC 2.9 (L) 10/27/2020   HGB 11.0 (L) 10/27/2020   HCT 34.9 (L) 10/27/2020   MCV 93.1 10/27/2020   PLT 172 10/27/2020   NEUTROABS 1.3 (L) 10/27/2020    CMP  Lab Results  Component Value Date   NA 143 10/27/2020   K 4.0 10/27/2020   CL 108 10/27/2020   CO2 27 10/27/2020   GLUCOSE 84 10/27/2020   BUN 13 10/27/2020   CREATININE 0.82 10/27/2020   CALCIUM 8.7 (L) 10/27/2020   PROT 6.3 (L) 10/27/2020   ALBUMIN 3.4 (L) 10/27/2020   AST 21 10/27/2020   ALT 13 10/27/2020   ALKPHOS 45 10/27/2020   BILITOT 0.4 10/27/2020   GFRNONAA >60 10/27/2020   GFRAA >60 10/08/2019    Lab Results  Component Value Date   CEA1 3.73 10/27/2020    Imaging:  CT Chest Wo Contrast  Result Date: 11/16/2020 CLINICAL DATA:  Gastrointestinal cancer.  Assess treatment response. EXAM: CT CHEST WITHOUT CONTRAST TECHNIQUE: Multidetector CT imaging of the chest was performed following the standard protocol without IV contrast. COMPARISON:  07/07/2020 FINDINGS: Cardiovascular: Stable 4 cm ascending thoracic aortic aneurysm. Aortic  atherosclerosis. Coronary artery atherosclerotic calcifications. No pericardial effusion. Mediastinum/Nodes: No enlarged axillary, supraclavicular, mediastinal lymph nodes. Hilar lymph nodes are suboptimally evaluated due to lack of IV contrast. Normal appearance of the thyroid gland. The trachea appears patent and is midline. Lungs/Pleura: Pulmonary metastatic disease is again noted. -index nodule within the posterior right upper lobe measures 1.3 cm, image 50/4. Previously this measured 1.4 cm. Index nodule within the anterior right upper lobe measures 1.6 cm, image 56/4. Unchanged. Nodule within the lingula measures 1.1 cm, image 83/4. Unchanged from previous exam. The dominant lesion within the perihilar right middle lobe measures 4.1 cm, image 93/2. previously this measured 4.9 cm. Upper Abdomen: No acute abnormality. Similar appearance of large left hepatic lobe cysts and stable appearing ablation site involving the right hepatic lobe. Musculoskeletal: No chest wall mass or suspicious bone lesions identified. IMPRESSION: 1. Bilateral pulmonary metastasis again noted. The dominant lesion within the perihilar right middle lobe is slightly decreased in size in the interval. 2. Other lung nodules are stable or minimally decreased in the interval. 3. Unchanged 4 cm ascending thoracic aortic aneurysm. Recommend annual imaging followup by CTA or MRA. This recommendation follows 2010 ACCF/AHA/AATS/ACR/ASA/SCA/SCAI/SIR/STS/SVM Guidelines for the Diagnosis and Management of Patients with Thoracic Aortic Disease. Circulation. 2010; 121: T700-F749. Aortic aneurysm NOS (ICD10-I71.9) 4. Aortic Atherosclerosis (ICD10-I70.0). Coronary artery calcifications. Electronically Signed   By: Kerby Moors M.D.   On: 11/16/2020 15:15    Medications: I have reviewed the patient's current medications.  Assessment/Plan: 1. Moderately differentiated adenocarcinoma of the cecum, stage III (T4a, N1), status post a laparoscopic  assisted right colectomy 10/22/2013  The tumor returned microsatellite stable with normal mismatch repair protein expression   Cycle 1 adjuvant Xeloda 12/25/2013.   Cycle 2 adjuvant Xeloda 01/15/2014.   Cycle 3 adjuvant Xeloda 02/05/2014.   Cycle 4 adjuvant Xeloda 03/18/2014, discontinued after 4 days secondary to diarrhea   Cycle 5 adjuvant Xeloda 04/16/2014, Xeloda dose reduced to 1000 mg in the a.m. and 500 mg in the p.m.   Cycle 6 adjuvant Xeloda 05/07/2014.  Cycle 7 adjuvant Xeloda 05/28/2014.  Cycle 8 adjuvant Xeloda 06/18/2014.  Restaging CTs on 07/14/2014 with no evidence of metastatic colon cancer  Colonoscopy 10/02/2014 with diverticulosis in the sigmoid colon and in the descending colon. One 4 mm polyp in the sigmoid colon with complete resection. Polyp tissue not retrieved. Internal hemorrhoids. Next colonoscopy in 3 years for surveillance.  Restaging CTs 07/16/2015 with 2 new hypodense liver lesions concerning for metastases, no other evidence of disease progression  PET scan 07/28/2015 with a single hypermetabolic right liver lesion, no other evidence of metastatic disease  Ultrasound-guided biopsy of the right liver lesion 07/30/2015 confirmed metastatic colon cancer, MSS, tumor mutation burden 1, KRAS G12D, PIK3CA, PTEN  MRI abdomen 08/20/2015 with similar to slight decrease in size of 2 hepatic metastasis. No new liver lesions or extrahepatic metastatic disease identified.  Radiofrequency ablation of 2 liver lesions 09/25/2015  MRI abdomen 02/02/2016-coagulative necrosis at the 2 ablated liver tumor sites,with no enhancement to suggest residual or recurrent liver tumor. No new liver masses or other findings of metastatic disease in the abdomen.  MRI abdomen 08/10/2016 revealed stable ablation sites, no evidence of progressive metastatic disease  Restaging CT 02/08/2017-stable treated liver lesions, no evidence of progressive metastatic disease, stable  borderline enlarged right inguinal node  CEA with stable mild elevation 02/08/2017, 03/01/2017, 03/29/2017,04/19/2017  CEA further elevated 08/21/2017  CTs 09/05/2017-possible local recurrence of tumor along the ablation site right hepatic lobe lesion. Cephalad to the ablation site approximately 1.9 x 2.1cmsoft tissue mass progressive compared to the prior exam.  Liver SBRT 10/17/2017, 10/19/2017, 10/23/2017, 10/25/2017, 10/27/2017  CT 02/19/2018-progressive ill-defined areas of low attenuation with enhancement surrounding the segment 6 ablation site, stable segment 8 ablation site enlargement of extracapsular lesion posteriorly, no new hepatic lesions, wall thickening/edema at the distal ileum  CT 05/22/2018-2 new lung nodules suspicious for metastases, stable segment 8 ablation zone, slight decrease in the size of delayed perfusion at the segment 6 ablation zone, posterior extracapsular lesion is slightly smaller  CT chest 07/13/2018-multiple lung nodules consistent with metastatic disease  Cycle 1 Xeloda 07/23/2018  Cycle 2 Xeloda 08/13/2018  Cycle 3 Xeloda 09/03/2018  Cycle 4 Xeloda 09/24/2018  Restaging chest CT 10/12/2018-scattered pulmonary nodules/metastases mildly improved.  Cycle 5 Xeloda 10/15/2018  Cycle 6 Xeloda 11/05/2018  Cycle 7 Xeloda 11/26/2018  Cycle 8 Xeloda 12/17/2018  Cycle 9 Xeloda 01/07/2019  CT chest 01/24/2019-stable pulmonary nodules. No new pulmonary nodules. Stable left hepatic lobe cysts and right hepatic lobe lesions. No new hepatic lesions. Stable right ninth and 10th rib lesions.  Xeloda daily dosing schedule beginning 01/28/2019 due to hand-foot syndrome  CT 05/27/2019-enlargement of lung metastases, similar right hepatic lobe lesion  CT 08/05/2019-enlargement of bilateral lung nodules, no new lesions, stable right hepatic lesion  Cycle 1 FOLFIRI 08/13/2019  Cycle 2 FOLFIRI 09/04/2019, Irinotecan dose reduced due to neutropenia  Cycle 3 FOLFIRI  09/17/2019  Cycle 4 FOLFIRI 10/08/2019, 5-FU  and irinotecan dose reduced  CT chest 10/28/2019-bilateral pulmonary metastases with minimal improvement. Stable ablation zone in the posterior right hepatic lobe.  CT chest 03/03/2020-enlargement of pulmonary metastases, decreased right hepatic ablation site  CT chest 07/07/2020-enlargement of pulmonary metastases, stable ablation site involving right hepatic lobe  Cycle 1 FOLFIRI/Avastin12/13/2021  Cycle 2 FOLFIRI/Avastin 08/18/2020  Treatment held 09/08/2020 due to neutropenia  Cycle 3 FOLFIRI/Avastin 09/17/2020 (irinotecan dose reduced due to neutropenia)  Cycle 4 FOLFIRI/Avastin 10/06/2020  Chemotherapy discontinued per patient request  CT chest 11/16/2020-decrease in size of dominant right perihilar nodule, other lung nodules are stable, stable right liver ablation site 2. Right ovary cystadenofibroma, status post a bilateral oophorectomy 10/22/2013 3. Remote history of cervical cancer. 4. Iron deficiency anemia-resolved. 5. Right calf deep vein thrombosis 10/24/2013-she completed 3 months of xarelto. 6. Indeterminate 12 mm hypermetabolic right pelvic density on the staging PET scan 10/04/2013-CT followup recommended per the GI tumor conference 11/13/2013. No longer visualized on a CT 07/14/2014. 7. Soft tissue implant overlying the posterior right liver on the CT 72/18/2883-DVO hypermetabolic on the PET scan 45/14/6047. 8. C. difficile colitis 03/05/2014. She completed a course of Flagyl, recurrent diarrhea beginning 03/21/2014, positive C. difficile toxin 03/26/2014-resumed Flagyl for planned 10 day course.  Vancomycin beginning 04/08/2014.  Taper complete 05/21/2014.  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 Xeloda dose/schedule adjusted beginning 01/28/2019.     Disposition: Ms. Dollens appears stable.  I reviewed the restaging CT images with her.  The lung nodules appear slightly decreased in  size compared to the last CT.  She has decided against further chemotherapy.  The plan is to follow her with observation.  We will consider a hospice referral if she develops new symptoms.  She will return for an office visit and Port-A-Cath flush in approximately 1 month.  [name], MD  11/19/2020  2:53 PM

## 2020-12-24 ENCOUNTER — Inpatient Hospital Stay: Payer: Medicare PPO

## 2020-12-24 ENCOUNTER — Inpatient Hospital Stay: Payer: Medicare PPO | Attending: Oncology | Admitting: Nurse Practitioner

## 2020-12-28 ENCOUNTER — Telehealth: Payer: Self-pay | Admitting: Nurse Practitioner

## 2020-12-28 NOTE — Telephone Encounter (Signed)
Scheduled appt per 5/16 sch msg - left message for patient with appt date and time

## 2021-01-18 ENCOUNTER — Encounter: Payer: Self-pay | Admitting: Nurse Practitioner

## 2021-01-18 ENCOUNTER — Inpatient Hospital Stay (HOSPITAL_BASED_OUTPATIENT_CLINIC_OR_DEPARTMENT_OTHER): Payer: Medicare PPO | Admitting: Nurse Practitioner

## 2021-01-18 ENCOUNTER — Inpatient Hospital Stay: Payer: Medicare PPO | Attending: Oncology

## 2021-01-18 ENCOUNTER — Other Ambulatory Visit: Payer: Self-pay

## 2021-01-18 VITALS — BP 169/80 | HR 60 | Temp 97.8°F | Resp 20 | Ht 61.0 in | Wt 123.8 lb

## 2021-01-18 DIAGNOSIS — C7800 Secondary malignant neoplasm of unspecified lung: Secondary | ICD-10-CM | POA: Diagnosis not present

## 2021-01-18 DIAGNOSIS — C180 Malignant neoplasm of cecum: Secondary | ICD-10-CM | POA: Insufficient documentation

## 2021-01-18 DIAGNOSIS — C189 Malignant neoplasm of colon, unspecified: Secondary | ICD-10-CM | POA: Diagnosis not present

## 2021-01-18 DIAGNOSIS — Z95828 Presence of other vascular implants and grafts: Secondary | ICD-10-CM

## 2021-01-18 DIAGNOSIS — C787 Secondary malignant neoplasm of liver and intrahepatic bile duct: Secondary | ICD-10-CM

## 2021-01-18 MED ORDER — HEPARIN SOD (PORK) LOCK FLUSH 100 UNIT/ML IV SOLN
500.0000 [IU] | Freq: Once | INTRAVENOUS | Status: DC | PRN
  Filled 2021-01-18: qty 5

## 2021-01-18 MED ORDER — SODIUM CHLORIDE 0.9% FLUSH
10.0000 mL | INTRAVENOUS | Status: DC | PRN
  Filled 2021-01-18: qty 10

## 2021-01-18 NOTE — Progress Notes (Signed)
[place] OFFICE PROGRESS NOTE   Diagnosis: Colon cancer  INTERVAL HISTORY:   Cynthia [name] returns as scheduled.  Energy level continues to be improved.  Overall fairly good appetite.  She is trying to eat multiple small meals throughout the day.  No nausea or vomiting.  Bowels are moving.  No new areas of pain.  Objective:  Vital signs in last 24 hours:  Blood pressure (!) 169/80, pulse 60, temperature 97.8 F (36.6 C), temperature source Oral, resp. rate 20, height 5' 1"  (1.549 m), weight 123 lb 12.8 oz (56.2 kg), SpO2 98 %.     Resp: Lungs clear bilaterally. Cardio: Regular rate and rhythm. GI: Abdomen soft and nontender.  No hepatomegaly. Vascular: No leg edema. Skin: No evidence for progressive tumor at the right posterior lateral chest wall scar. Port-A-Cath without erythema.  Lab Results:  Lab Results  Component Value Date   WBC 2.9 (L) 10/27/2020   HGB 11.0 (L) 10/27/2020   HCT 34.9 (L) 10/27/2020   MCV 93.1 10/27/2020   PLT 172 10/27/2020   NEUTROABS 1.3 (L) 10/27/2020    Imaging:  No results found.  Medications: I have reviewed the patient's current medications.  Assessment/Plan: 1. Moderately differentiated adenocarcinoma of the cecum, stage III (T4a, N1), status post a laparoscopic assisted right colectomy 10/22/2013  The tumor returned microsatellite stable with normal mismatch repair protein expression   Cycle 1 adjuvant Xeloda 12/25/2013.   Cycle 2 adjuvant Xeloda 01/15/2014.   Cycle 3 adjuvant Xeloda 02/05/2014.   Cycle 4 adjuvant Xeloda 03/18/2014, discontinued after 4 days secondary to diarrhea   Cycle 5 adjuvant Xeloda 04/16/2014, Xeloda dose reduced to 1000 mg in the a.m. and 500 mg in the p.m.   Cycle 6 adjuvant Xeloda 05/07/2014.  Cycle 7 adjuvant Xeloda 05/28/2014.  Cycle 8 adjuvant Xeloda 06/18/2014.  Restaging CTs on 07/14/2014 with no evidence of metastatic colon cancer  Colonoscopy 10/02/2014 with  diverticulosis in the sigmoid colon and in the descending colon. One 4 mm polyp in the sigmoid colon with complete resection. Polyp tissue not retrieved. Internal hemorrhoids. Next colonoscopy in 3 years for surveillance.  Restaging CTs 07/16/2015 with 2 new hypodense liver lesions concerning for metastases, no other evidence of disease progression  PET scan 07/28/2015 with a single hypermetabolic right liver lesion, no other evidence of metastatic disease  Ultrasound-guided biopsy of the right liver lesion 07/30/2015 confirmed metastatic colon cancer, MSS, tumor mutation burden 1, KRAS G12D, PIK3CA, PTEN  MRI abdomen 08/20/2015 with similar to slight decrease in size of 2 hepatic metastasis. No new liver lesions or extrahepatic metastatic disease identified.  Radiofrequency ablation of 2 liver lesions 09/25/2015  MRI abdomen 02/02/2016-coagulative necrosis at the 2 ablated liver tumor sites,with no enhancement to suggest residual or recurrent liver tumor. No new liver masses or other findings of metastatic disease in the abdomen.  MRI abdomen 08/10/2016 revealed stable ablation sites, no evidence of progressive metastatic disease  Restaging CT 02/08/2017-stable treated liver lesions, no evidence of progressive metastatic disease, stable borderline enlarged right inguinal node  CEA with stable mild elevation 02/08/2017, 03/01/2017, 03/29/2017,04/19/2017  CEA further elevated 08/21/2017  CTs 09/05/2017-possible local recurrence of tumor along the ablation site right hepatic lobe lesion. Cephalad to the ablation site approximately 1.9 x 2.1cmsoft tissue mass progressive compared to the prior exam.  Liver SBRT 10/17/2017, 10/19/2017, 10/23/2017, 10/25/2017, 10/27/2017  CT 02/19/2018-progressive ill-defined areas of low attenuation with enhancement surrounding the segment 6 ablation site, stable segment 8 ablation site enlargement of  extracapsular lesion posteriorly, no new hepatic lesions, wall  thickening/edema at the distal ileum  CT 05/22/2018-2 new lung nodules suspicious for metastases, stable segment 8 ablation zone, slight decrease in the size of delayed perfusion at the segment 6 ablation zone, posterior extracapsular lesion is slightly smaller  CT chest 07/13/2018-multiple lung nodules consistent with metastatic disease  Cycle 1 Xeloda 07/23/2018  Cycle 2 Xeloda 08/13/2018  Cycle 3 Xeloda 09/03/2018  Cycle 4 Xeloda 09/24/2018  Restaging chest CT 10/12/2018-scattered pulmonary nodules/metastases mildly improved.  Cycle 5 Xeloda 10/15/2018  Cycle 6 Xeloda 11/05/2018  Cycle 7 Xeloda 11/26/2018  Cycle 8 Xeloda 12/17/2018  Cycle 9 Xeloda 01/07/2019  CT chest 01/24/2019-stable pulmonary nodules. No new pulmonary nodules. Stable left hepatic lobe cysts and right hepatic lobe lesions. No new hepatic lesions. Stable right ninth and 10th rib lesions.  Xeloda daily dosing schedule beginning 01/28/2019 due to hand-foot syndrome  CT 05/27/2019-enlargement of lung metastases, similar right hepatic lobe lesion  CT 08/05/2019-enlargement of bilateral lung nodules, no new lesions, stable right hepatic lesion  Cycle 1 FOLFIRI 08/13/2019  Cycle 2 FOLFIRI 09/04/2019, Irinotecan dose reduced due to neutropenia  Cycle 3 FOLFIRI 09/17/2019  Cycle 4 FOLFIRI 10/08/2019, 5-FU and irinotecan dose reduced  CT chest 10/28/2019-bilateral pulmonary metastases with minimal improvement. Stable ablation zone in the posterior right hepatic lobe.  CT chest 03/03/2020-enlargement of pulmonary metastases, decreased right hepatic ablation site  CT chest 07/07/2020-enlargement of pulmonary metastases, stable ablation site involving right hepatic lobe  Cycle 1 FOLFIRI/Avastin12/13/2021  Cycle 2 FOLFIRI/Avastin 08/18/2020  Treatment held 09/08/2020 due to neutropenia  Cycle 3 FOLFIRI/Avastin 09/17/2020 (irinotecan dose reduceddue to neutropenia)  Cycle 4 FOLFIRI/Avastin 10/06/2020  Chemotherapy  discontinued per patient request  CT chest 11/16/2020-decrease in size of dominant right perihilar nodule, other lung nodules are stable, stable right liver ablation site 2. Right ovary cystadenofibroma, status post a bilateral oophorectomy 10/22/2013 3. Remote history of cervical cancer. 4. Iron deficiency anemia-resolved. 5. Right calf deep vein thrombosis 10/24/2013-she completed 3 months of xarelto. 6. Indeterminate 12 mm hypermetabolic right pelvic density on the staging PET scan 10/04/2013-CT followup recommended per the GI tumor conference 11/13/2013. No longer visualized on a CT 07/14/2014. 7. Soft tissue implant overlying the posterior right liver on the CT 77/93/9030-SPQ hypermetabolic on the PET scan 33/00/7622. 8. C. difficile colitis 03/05/2014. She completed a course of Flagyl, recurrent diarrhea beginning 03/21/2014, positive C. difficile toxin 03/26/2014-resumed Flagyl for planned 10 day course.  Vancomycin beginning 04/08/2014.  Taper complete 05/21/2014.  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 Xeloda dose/schedule adjusted beginning 01/28/2019.    Disposition: Ms. Wolven appears stable.  There is no clinical evidence of disease progression.  Plan is to continue to follow with observation.  She will return for a port flush and follow-up visit in 6 weeks.  We are available to see her sooner if needed.   Ned Card ANP/GNP-BC   01/18/2021  11:51 AM

## 2021-03-01 ENCOUNTER — Other Ambulatory Visit: Payer: Self-pay

## 2021-03-01 ENCOUNTER — Inpatient Hospital Stay: Payer: Medicare PPO | Attending: Oncology

## 2021-03-01 ENCOUNTER — Inpatient Hospital Stay: Payer: Medicare PPO | Admitting: Oncology

## 2021-03-01 VITALS — BP 140/89 | HR 60 | Temp 96.9°F | Resp 18 | Wt 125.2 lb

## 2021-03-01 DIAGNOSIS — C787 Secondary malignant neoplasm of liver and intrahepatic bile duct: Secondary | ICD-10-CM

## 2021-03-01 DIAGNOSIS — C189 Malignant neoplasm of colon, unspecified: Secondary | ICD-10-CM | POA: Diagnosis not present

## 2021-03-01 DIAGNOSIS — Z86718 Personal history of other venous thrombosis and embolism: Secondary | ICD-10-CM | POA: Insufficient documentation

## 2021-03-01 DIAGNOSIS — C180 Malignant neoplasm of cecum: Secondary | ICD-10-CM | POA: Insufficient documentation

## 2021-03-01 DIAGNOSIS — C7801 Secondary malignant neoplasm of right lung: Secondary | ICD-10-CM | POA: Insufficient documentation

## 2021-03-01 DIAGNOSIS — C7802 Secondary malignant neoplasm of left lung: Secondary | ICD-10-CM | POA: Insufficient documentation

## 2021-03-01 NOTE — Progress Notes (Signed)
[place] OFFICE PROGRESS NOTE   Diagnosis: Colon cancer  INTERVAL HISTORY:   Cynthia [name] returns as scheduled.  She reports feeling "dizzy "at times since beginning an increased dose of metoprolol.  Stable neuropathy symptoms in the legs.  No discomfort at the right chest wall.  Objective:  Vital signs in last 24 hours:  Blood pressure 140/89, pulse 60, temperature (!) 96.9 F (36.1 C), temperature source Tympanic, resp. rate 18, weight 125 lb 3.2 oz (56.8 kg), SpO2 98 %.    Lymphatics: No cervical, supraclavicular, or axillary nodes Resp: Lungs clear bilaterally Cardio: Regular rate and rhythm GI: No hepatosplenomegaly, rounded masslike fullness in the right upper abdomen-reducible, hernia? Vascular: No leg edema  Skin: Slight skin thickening at the right mid lateral chest wall without a discrete mass  Portacath/PICC-without erythema  Lab Results:  Lab Results  Component Value Date   WBC 2.9 (L) 10/27/2020   HGB 11.0 (L) 10/27/2020   HCT 34.9 (L) 10/27/2020   MCV 93.1 10/27/2020   PLT 172 10/27/2020   NEUTROABS 1.3 (L) 10/27/2020    CMP  Lab Results  Component Value Date   NA 143 10/27/2020   K 4.0 10/27/2020   CL 108 10/27/2020   CO2 27 10/27/2020   GLUCOSE 84 10/27/2020   BUN 13 10/27/2020   CREATININE 0.82 10/27/2020   CALCIUM 8.7 (L) 10/27/2020   PROT 6.3 (L) 10/27/2020   ALBUMIN 3.4 (L) 10/27/2020   AST 21 10/27/2020   ALT 13 10/27/2020   ALKPHOS 45 10/27/2020   BILITOT 0.4 10/27/2020   GFRNONAA >60 10/27/2020   GFRAA >60 10/08/2019    Lab Results  Component Value Date   CEA1 3.73 10/27/2020   CEA 0.9 01/07/2016     Medications: I have reviewed the patient's current medications.   Assessment/Plan:  Moderately differentiated adenocarcinoma of the cecum, stage III (T4a, N1), status post a laparoscopic assisted right colectomy 10/22/2013 The tumor returned microsatellite stable with normal mismatch repair protein  expression   Cycle 1 adjuvant Xeloda 12/25/2013.   Cycle 2 adjuvant Xeloda 01/15/2014.   Cycle 3 adjuvant Xeloda 02/05/2014.   Cycle 4 adjuvant Xeloda 03/18/2014, discontinued after 4 days secondary to diarrhea   Cycle 5 adjuvant Xeloda 04/16/2014, Xeloda dose reduced to 1000 mg in the a.m. and 500 mg in the p.m.   Cycle 6 adjuvant Xeloda 05/07/2014. Cycle 7 adjuvant Xeloda 05/28/2014. Cycle 8 adjuvant Xeloda 06/18/2014. Restaging CTs on 07/14/2014 with no evidence of metastatic colon cancer Colonoscopy 10/02/2014 with diverticulosis in the sigmoid colon and in the descending colon. One 4 mm polyp in the sigmoid colon with complete resection. Polyp tissue not retrieved. Internal hemorrhoids. Next colonoscopy in 3 years for surveillance. Restaging CTs 07/16/2015 with 2 new hypodense liver lesions concerning for metastases, no other evidence of disease progression PET scan 07/28/2015 with a single hypermetabolic right liver lesion, no other evidence of metastatic disease Ultrasound-guided biopsy of the right liver lesion 07/30/2015 confirmed metastatic colon cancer, MSS, tumor mutation burden 1, KRAS G12D, PIK3CA, PTEN MRI abdomen 08/20/2015 with similar to slight decrease in size of 2 hepatic metastasis. No new liver lesions or extrahepatic metastatic disease identified. Radiofrequency ablation of 2 liver lesions 09/25/2015 MRI abdomen 02/02/2016-coagulative necrosis at the 2 ablated liver tumor sites, with no enhancement to suggest residual or recurrent liver tumor. No new liver masses or other findings of metastatic disease in the abdomen. MRI abdomen 08/10/2016 revealed stable ablation sites, no evidence of progressive metastatic disease Restaging  CT 02/08/2017-stable treated liver lesions, no evidence of progressive metastatic disease, stable borderline enlarged right inguinal node  CEA with stable mild elevation 02/08/2017, 03/01/2017, 03/29/2017, 04/19/2017 CEA further elevated 08/21/2017 CTs  09/05/2017- possible local recurrence of tumor along the ablation site right hepatic lobe lesion.  Cephalad to the ablation site approximately 1.9 x 2.1 cm soft tissue mass progressive compared to the prior exam. Liver SBRT 10/17/2017, 10/19/2017, 10/23/2017, 10/25/2017, 10/27/2017 CT 02/19/2018- progressive ill-defined areas of low attenuation with enhancement surrounding the segment 6 ablation site, stable segment 8 ablation site enlargement of extracapsular lesion posteriorly, no new hepatic lesions, wall thickening/edema at the distal ileum CT 05/22/2018- 2 new lung nodules suspicious for metastases, stable segment 8 ablation zone, slight decrease in the size of delayed perfusion at the segment 6 ablation zone, posterior extracapsular lesion is slightly smaller CT chest 07/13/2018- multiple lung nodules consistent with metastatic disease Cycle 1 Xeloda 07/23/2018 Cycle 2 Xeloda 08/13/2018 Cycle 3 Xeloda 09/03/2018 Cycle 4 Xeloda 09/24/2018 Restaging chest CT 10/12/2018- scattered pulmonary nodules/metastases mildly improved. Cycle 5 Xeloda 10/15/2018 Cycle 6 Xeloda 11/05/2018 Cycle 7 Xeloda 11/26/2018 Cycle 8 Xeloda 12/17/2018 Cycle 9 Xeloda 01/07/2019 CT chest 01/24/2019- stable pulmonary nodules.  No new pulmonary nodules.  Stable left hepatic lobe cysts and right hepatic lobe lesions.  No new hepatic lesions.  Stable right ninth and 10th rib lesions. Xeloda daily dosing schedule beginning 01/28/2019 due to hand-foot syndrome CT 05/27/2019- enlargement of lung metastases, similar right hepatic lobe lesion CT 08/05/2019-enlargement of bilateral lung nodules, no new lesions, stable right hepatic lesion Cycle 1 FOLFIRI 08/13/2019 Cycle 2 FOLFIRI 09/04/2019, Irinotecan dose reduced due to neutropenia Cycle 3 FOLFIRI 09/17/2019 Cycle 4 FOLFIRI 10/08/2019, 5-FU and irinotecan dose reduced CT chest 10/28/2019-bilateral pulmonary metastases with minimal improvement.  Stable ablation zone in the posterior right hepatic  lobe. CT chest 03/03/2020-enlargement of pulmonary metastases, decreased right hepatic ablation site CT chest 07/07/2020-enlargement of pulmonary metastases, stable ablation site involving right hepatic lobe Cycle 1 FOLFIRI/Avastin 07/27/2020 Cycle 2 FOLFIRI/Avastin 08/18/2020 Treatment held 09/08/2020 due to neutropenia Cycle 3 FOLFIRI/Avastin 09/17/2020 (irinotecan dose reduced due to neutropenia) Cycle 4 FOLFIRI/Avastin 10/06/2020 Chemotherapy discontinued per patient request CT chest 11/16/2020-decrease in size of dominant right perihilar nodule, other lung nodules are stable, stable right liver ablation site Right ovary cystadenofibroma, status post a bilateral oophorectomy 10/22/2013 Remote history of cervical cancer. Iron deficiency anemia-resolved. Right calf deep vein thrombosis 10/24/2013-she completed 3 months of xarelto. Indeterminate 12 mm hypermetabolic right pelvic density on the staging PET scan 10/04/2013-CT followup recommended per the GI tumor conference 11/13/2013. No longer visualized on a CT 07/14/2014. Soft tissue implant overlying the posterior right liver on the CT 42/68/3419-QQI hypermetabolic on the PET scan 29/79/8921. C. difficile colitis 03/05/2014. She completed a course of Flagyl, recurrent diarrhea beginning 03/21/2014, positive C. difficile toxin 03/26/2014-resumed Flagyl for planned 10 day course. Vancomycin beginning 04/08/2014. Taper complete 05/21/2014.   9.  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  Xeloda dose/schedule adjusted beginning 01/28/2019.    Disposition: Cynthia [name] appears stable.  She remains off of specific therapy for colon cancer.  She does not wish to undergo restaging CTs at present.  She will return for an office visit and CEA in 6 weeks.  The firm rounded fullness in the right upper abdomen may be a hernia versus abdominal wall tumor.  She denies pain in this area.    [name], MD  03/01/2021  12:39  PM

## 2021-04-12 ENCOUNTER — Encounter: Payer: Self-pay | Admitting: Nurse Practitioner

## 2021-04-12 ENCOUNTER — Inpatient Hospital Stay: Payer: Medicare PPO

## 2021-04-12 ENCOUNTER — Inpatient Hospital Stay: Payer: Medicare PPO | Attending: Oncology | Admitting: Nurse Practitioner

## 2021-04-12 ENCOUNTER — Other Ambulatory Visit: Payer: Self-pay

## 2021-04-12 VITALS — BP 146/90 | HR 67 | Temp 97.8°F | Resp 18 | Ht 61.0 in | Wt 125.0 lb

## 2021-04-12 DIAGNOSIS — R52 Pain, unspecified: Secondary | ICD-10-CM | POA: Diagnosis not present

## 2021-04-12 DIAGNOSIS — C180 Malignant neoplasm of cecum: Secondary | ICD-10-CM | POA: Insufficient documentation

## 2021-04-12 DIAGNOSIS — Z8541 Personal history of malignant neoplasm of cervix uteri: Secondary | ICD-10-CM | POA: Insufficient documentation

## 2021-04-12 DIAGNOSIS — C189 Malignant neoplasm of colon, unspecified: Secondary | ICD-10-CM

## 2021-04-12 DIAGNOSIS — D270 Benign neoplasm of right ovary: Secondary | ICD-10-CM | POA: Diagnosis not present

## 2021-04-12 DIAGNOSIS — C787 Secondary malignant neoplasm of liver and intrahepatic bile duct: Secondary | ICD-10-CM | POA: Diagnosis not present

## 2021-04-12 DIAGNOSIS — Z79899 Other long term (current) drug therapy: Secondary | ICD-10-CM | POA: Diagnosis not present

## 2021-04-12 DIAGNOSIS — Z90722 Acquired absence of ovaries, bilateral: Secondary | ICD-10-CM | POA: Diagnosis not present

## 2021-04-12 DIAGNOSIS — C7802 Secondary malignant neoplasm of left lung: Secondary | ICD-10-CM | POA: Diagnosis not present

## 2021-04-12 DIAGNOSIS — Z86718 Personal history of other venous thrombosis and embolism: Secondary | ICD-10-CM | POA: Diagnosis not present

## 2021-04-12 DIAGNOSIS — Z9049 Acquired absence of other specified parts of digestive tract: Secondary | ICD-10-CM | POA: Diagnosis not present

## 2021-04-12 DIAGNOSIS — K7689 Other specified diseases of liver: Secondary | ICD-10-CM | POA: Diagnosis not present

## 2021-04-12 DIAGNOSIS — K648 Other hemorrhoids: Secondary | ICD-10-CM | POA: Diagnosis not present

## 2021-04-12 DIAGNOSIS — Z95828 Presence of other vascular implants and grafts: Secondary | ICD-10-CM

## 2021-04-12 LAB — CEA (IN HOUSE-CHCC): CEA (CHCC-In House): 6.57 ng/mL — ABNORMAL HIGH (ref 0.00–5.00)

## 2021-04-12 LAB — CEA (ACCESS): CEA (CHCC): 7.23 ng/mL — ABNORMAL HIGH (ref 0.00–5.00)

## 2021-04-12 MED ORDER — HEPARIN SOD (PORK) LOCK FLUSH 100 UNIT/ML IV SOLN
500.0000 [IU] | Freq: Once | INTRAVENOUS | Status: AC | PRN
  Administered 2021-04-12: 500 [IU]

## 2021-04-12 MED ORDER — SODIUM CHLORIDE 0.9% FLUSH
10.0000 mL | INTRAVENOUS | Status: DC | PRN
  Administered 2021-04-12: 10 mL

## 2021-04-12 NOTE — Progress Notes (Signed)
[place] OFFICE PROGRESS NOTE   Diagnosis: Colon cancer  INTERVAL HISTORY:   Cynthia [name] returns as scheduled.  She states that she "feels fine".  She has occasional pain along the right side.  Appetite is unchanged.  Weight is stable.  Objective:  Vital signs in last 24 hours:  Blood pressure (!) 146/90, pulse 67, temperature 97.8 F (36.6 C), temperature source Oral, resp. rate 18, height 5' 1"  (1.549 m), weight 125 lb (56.7 kg), SpO2 98 %.    Resp: Lungs clear bilaterally. Cardio: Regular rate and rhythm. GI: No hepatomegaly.  Masslike fullness right upper abdomen, nontender. Vascular: No leg edema. Skin: Right mid lateral chest wall without erythema, slight skin thickening.  No discrete mass. Port-A-Cath without erythema.  Lab Results:  Lab Results  Component Value Date   WBC 2.9 (L) 10/27/2020   HGB 11.0 (L) 10/27/2020   HCT 34.9 (L) 10/27/2020   MCV 93.1 10/27/2020   PLT 172 10/27/2020   NEUTROABS 1.3 (L) 10/27/2020    Imaging:  No results found.  Medications: I have reviewed the patient's current medications.  Assessment/Plan: Moderately differentiated adenocarcinoma of the cecum, stage III (T4a, N1), status post a laparoscopic assisted right colectomy 10/22/2013 The tumor returned microsatellite stable with normal mismatch repair protein expression   Cycle 1 adjuvant Xeloda 12/25/2013.   Cycle 2 adjuvant Xeloda 01/15/2014.   Cycle 3 adjuvant Xeloda 02/05/2014.   Cycle 4 adjuvant Xeloda 03/18/2014, discontinued after 4 days secondary to diarrhea   Cycle 5 adjuvant Xeloda 04/16/2014, Xeloda dose reduced to 1000 mg in the a.m. and 500 mg in the p.m.   Cycle 6 adjuvant Xeloda 05/07/2014. Cycle 7 adjuvant Xeloda 05/28/2014. Cycle 8 adjuvant Xeloda 06/18/2014. Restaging CTs on 07/14/2014 with no evidence of metastatic colon cancer Colonoscopy 10/02/2014 with diverticulosis in the sigmoid colon and in the descending colon. One 4 mm polyp in  the sigmoid colon with complete resection. Polyp tissue not retrieved. Internal hemorrhoids. Next colonoscopy in 3 years for surveillance. Restaging CTs 07/16/2015 with 2 new hypodense liver lesions concerning for metastases, no other evidence of disease progression PET scan 07/28/2015 with a single hypermetabolic right liver lesion, no other evidence of metastatic disease Ultrasound-guided biopsy of the right liver lesion 07/30/2015 confirmed metastatic colon cancer, MSS, tumor mutation burden 1, KRAS G12D, PIK3CA, PTEN MRI abdomen 08/20/2015 with similar to slight decrease in size of 2 hepatic metastasis. No new liver lesions or extrahepatic metastatic disease identified. Radiofrequency ablation of 2 liver lesions 09/25/2015 MRI abdomen 02/02/2016-coagulative necrosis at the 2 ablated liver tumor sites, with no enhancement to suggest residual or recurrent liver tumor. No new liver masses or other findings of metastatic disease in the abdomen. MRI abdomen 08/10/2016 revealed stable ablation sites, no evidence of progressive metastatic disease Restaging CT 02/08/2017-stable treated liver lesions, no evidence of progressive metastatic disease, stable borderline enlarged right inguinal node  CEA with stable mild elevation 02/08/2017, 03/01/2017, 03/29/2017, 04/19/2017 CEA further elevated 08/21/2017 CTs 09/05/2017- possible local recurrence of tumor along the ablation site right hepatic lobe lesion.  Cephalad to the ablation site approximately 1.9 x 2.1 cm soft tissue mass progressive compared to the prior exam. Liver SBRT 10/17/2017, 10/19/2017, 10/23/2017, 10/25/2017, 10/27/2017 CT 02/19/2018- progressive ill-defined areas of low attenuation with enhancement surrounding the segment 6 ablation site, stable segment 8 ablation site enlargement of extracapsular lesion posteriorly, no new hepatic lesions, wall thickening/edema at the distal ileum CT 05/22/2018- 2 new lung nodules suspicious for metastases, stable segment  8  ablation zone, slight decrease in the size of delayed perfusion at the segment 6 ablation zone, posterior extracapsular lesion is slightly smaller CT chest 07/13/2018- multiple lung nodules consistent with metastatic disease Cycle 1 Xeloda 07/23/2018 Cycle 2 Xeloda 08/13/2018 Cycle 3 Xeloda 09/03/2018 Cycle 4 Xeloda 09/24/2018 Restaging chest CT 10/12/2018- scattered pulmonary nodules/metastases mildly improved. Cycle 5 Xeloda 10/15/2018 Cycle 6 Xeloda 11/05/2018 Cycle 7 Xeloda 11/26/2018 Cycle 8 Xeloda 12/17/2018 Cycle 9 Xeloda 01/07/2019 CT chest 01/24/2019- stable pulmonary nodules.  No new pulmonary nodules.  Stable left hepatic lobe cysts and right hepatic lobe lesions.  No new hepatic lesions.  Stable right ninth and 10th rib lesions. Xeloda daily dosing schedule beginning 01/28/2019 due to hand-foot syndrome CT 05/27/2019- enlargement of lung metastases, similar right hepatic lobe lesion CT 08/05/2019-enlargement of bilateral lung nodules, no new lesions, stable right hepatic lesion Cycle 1 FOLFIRI 08/13/2019 Cycle 2 FOLFIRI 09/04/2019, Irinotecan dose reduced due to neutropenia Cycle 3 FOLFIRI 09/17/2019 Cycle 4 FOLFIRI 10/08/2019, 5-FU and irinotecan dose reduced CT chest 10/28/2019-bilateral pulmonary metastases with minimal improvement.  Stable ablation zone in the posterior right hepatic lobe. CT chest 03/03/2020-enlargement of pulmonary metastases, decreased right hepatic ablation site CT chest 07/07/2020-enlargement of pulmonary metastases, stable ablation site involving right hepatic lobe Cycle 1 FOLFIRI/Avastin 07/27/2020 Cycle 2 FOLFIRI/Avastin 08/18/2020 Treatment held 09/08/2020 due to neutropenia Cycle 3 FOLFIRI/Avastin 09/17/2020 (irinotecan dose reduced due to neutropenia) Cycle 4 FOLFIRI/Avastin 10/06/2020 Chemotherapy discontinued per patient request CT chest 11/16/2020-decrease in size of dominant right perihilar nodule, other lung nodules are stable, stable right liver ablation  site Right ovary cystadenofibroma, status post a bilateral oophorectomy 10/22/2013 Remote history of cervical cancer. Iron deficiency anemia-resolved. Right calf deep vein thrombosis 10/24/2013-she completed 3 months of xarelto. Indeterminate 12 mm hypermetabolic right pelvic density on the staging PET scan 10/04/2013-CT followup recommended per the GI tumor conference 11/13/2013. No longer visualized on a CT 07/14/2014. Soft tissue implant overlying the posterior right liver on the CT 70/34/0352-YEL hypermetabolic on the PET scan 85/90/9311. C. difficile colitis 03/05/2014. She completed a course of Flagyl, recurrent diarrhea beginning 03/21/2014, positive C. difficile toxin 03/26/2014-resumed Flagyl for planned 10 day course. Vancomycin beginning 04/08/2014. Taper complete 05/21/2014.   9.  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  Xeloda dose/schedule adjusted beginning 01/28/2019.      Disposition: Cynthia [name] appears unchanged.  She remains off of specific therapy for colon cancer.  There is no clinical evidence of disease progression.  We discussed restaging CTs and decided to hold on this for now.  We will follow-up on the CEA from today.  She will return for Port-A-Cath flush and follow-up visit in 6 weeks.  We are available to see her sooner if needed.    Ned Card ANP/GNP-BC   04/12/2021  11:09 AM

## 2021-05-24 ENCOUNTER — Inpatient Hospital Stay: Payer: Medicare PPO | Admitting: Oncology

## 2021-05-24 ENCOUNTER — Other Ambulatory Visit: Payer: Self-pay

## 2021-05-24 ENCOUNTER — Inpatient Hospital Stay: Payer: Medicare PPO

## 2021-05-24 ENCOUNTER — Inpatient Hospital Stay: Payer: Medicare PPO | Attending: Oncology

## 2021-05-24 VITALS — BP 154/87 | HR 64 | Temp 98.7°F | Resp 18 | Ht 61.0 in | Wt 123.8 lb

## 2021-05-24 DIAGNOSIS — Z95828 Presence of other vascular implants and grafts: Secondary | ICD-10-CM

## 2021-05-24 DIAGNOSIS — C180 Malignant neoplasm of cecum: Secondary | ICD-10-CM | POA: Diagnosis not present

## 2021-05-24 DIAGNOSIS — C787 Secondary malignant neoplasm of liver and intrahepatic bile duct: Secondary | ICD-10-CM | POA: Insufficient documentation

## 2021-05-24 DIAGNOSIS — C189 Malignant neoplasm of colon, unspecified: Secondary | ICD-10-CM

## 2021-05-24 DIAGNOSIS — C7800 Secondary malignant neoplasm of unspecified lung: Secondary | ICD-10-CM | POA: Insufficient documentation

## 2021-05-24 LAB — CEA (ACCESS): CEA (CHCC): 4.35 ng/mL (ref 0.00–5.00)

## 2021-05-24 LAB — CEA (IN HOUSE-CHCC): CEA (CHCC-In House): 4.27 ng/mL (ref 0.00–5.00)

## 2021-05-24 MED ORDER — HEPARIN SOD (PORK) LOCK FLUSH 100 UNIT/ML IV SOLN
500.0000 [IU] | Freq: Once | INTRAVENOUS | Status: AC | PRN
  Administered 2021-05-24: 500 [IU]

## 2021-05-24 MED ORDER — SODIUM CHLORIDE 0.9% FLUSH
10.0000 mL | INTRAVENOUS | Status: AC | PRN
  Administered 2021-05-24: 10 mL

## 2021-05-24 NOTE — Progress Notes (Signed)
[place] OFFICE PROGRESS NOTE   Diagnosis: Colon cancer  INTERVAL HISTORY:   [name] returns as scheduled.  She feels well.  Good appetite.  No change in the right chest wall induration.  No pain.  No new complaint.  Objective:  Vital signs in last 24 hours:  Blood pressure (!) 154/87, pulse 64, temperature 98.7 F (37.1 C), temperature source Oral, resp. rate 18, height 5' 1"  (1.549 m), weight 123 lb 12.8 oz (56.2 kg), SpO2 98 %.    Resp: Lungs clear bilaterally Cardio: Regular rate and rhythm GI: No hepatosplenomegaly, firm fullness in the left upper abdomen near the midline-hernia? Vascular: The right lower leg is larger than the left side, no edema   Portacath/PICC-without erythema  Lab Results:  Lab Results  Component Value Date   WBC 2.9 (L) 10/27/2020   HGB 11.0 (L) 10/27/2020   HCT 34.9 (L) 10/27/2020   MCV 93.1 10/27/2020   PLT 172 10/27/2020   NEUTROABS 1.3 (L) 10/27/2020    CMP  Lab Results  Component Value Date   NA 143 10/27/2020   K 4.0 10/27/2020   CL 108 10/27/2020   CO2 27 10/27/2020   GLUCOSE 84 10/27/2020   BUN 13 10/27/2020   CREATININE 0.82 10/27/2020   CALCIUM 8.7 (L) 10/27/2020   PROT 6.3 (L) 10/27/2020   ALBUMIN 3.4 (L) 10/27/2020   AST 21 10/27/2020   ALT 13 10/27/2020   ALKPHOS 45 10/27/2020   BILITOT 0.4 10/27/2020   GFRNONAA >60 10/27/2020   GFRAA >60 10/08/2019    Lab Results  Component Value Date   CEA1 6.57 (H) 04/12/2021   CEA 7.23 (H) 04/12/2021     Medications: I have reviewed the patient's current medications.   Assessment/Plan: Moderately differentiated adenocarcinoma of the cecum, stage III (T4a, N1), status post a laparoscopic assisted right colectomy 10/22/2013 The tumor returned microsatellite stable with normal mismatch repair protein expression   Cycle 1 adjuvant Xeloda 12/25/2013.   Cycle 2 adjuvant Xeloda 01/15/2014.   Cycle 3 adjuvant Xeloda 02/05/2014.   Cycle 4 adjuvant  Xeloda 03/18/2014, discontinued after 4 days secondary to diarrhea   Cycle 5 adjuvant Xeloda 04/16/2014, Xeloda dose reduced to 1000 mg in the a.m. and 500 mg in the p.m.   Cycle 6 adjuvant Xeloda 05/07/2014. Cycle 7 adjuvant Xeloda 05/28/2014. Cycle 8 adjuvant Xeloda 06/18/2014. Restaging CTs on 07/14/2014 with no evidence of metastatic colon cancer Colonoscopy 10/02/2014 with diverticulosis in the sigmoid colon and in the descending colon. One 4 mm polyp in the sigmoid colon with complete resection. Polyp tissue not retrieved. Internal hemorrhoids. Next colonoscopy in 3 years for surveillance. Restaging CTs 07/16/2015 with 2 new hypodense liver lesions concerning for metastases, no other evidence of disease progression PET scan 07/28/2015 with a single hypermetabolic right liver lesion, no other evidence of metastatic disease Ultrasound-guided biopsy of the right liver lesion 07/30/2015 confirmed metastatic colon cancer, MSS, tumor mutation burden 1, KRAS G12D, PIK3CA, PTEN MRI abdomen 08/20/2015 with similar to slight decrease in size of 2 hepatic metastasis. No new liver lesions or extrahepatic metastatic disease identified. Radiofrequency ablation of 2 liver lesions 09/25/2015 MRI abdomen 02/02/2016-coagulative necrosis at the 2 ablated liver tumor sites, with no enhancement to suggest residual or recurrent liver tumor. No new liver masses or other findings of metastatic disease in the abdomen. MRI abdomen 08/10/2016 revealed stable ablation sites, no evidence of progressive metastatic disease Restaging CT 02/08/2017-stable treated liver lesions, no evidence of progressive metastatic disease, stable  borderline enlarged right inguinal node  CEA with stable mild elevation 02/08/2017, 03/01/2017, 03/29/2017, 04/19/2017 CEA further elevated 08/21/2017 CTs 09/05/2017- possible local recurrence of tumor along the ablation site right hepatic lobe lesion.  Cephalad to the ablation site approximately 1.9 x  2.1 cm soft tissue mass progressive compared to the prior exam. Liver SBRT 10/17/2017, 10/19/2017, 10/23/2017, 10/25/2017, 10/27/2017 CT 02/19/2018- progressive ill-defined areas of low attenuation with enhancement surrounding the segment 6 ablation site, stable segment 8 ablation site enlargement of extracapsular lesion posteriorly, no new hepatic lesions, wall thickening/edema at the distal ileum CT 05/22/2018- 2 new lung nodules suspicious for metastases, stable segment 8 ablation zone, slight decrease in the size of delayed perfusion at the segment 6 ablation zone, posterior extracapsular lesion is slightly smaller CT chest 07/13/2018- multiple lung nodules consistent with metastatic disease Cycle 1 Xeloda 07/23/2018 Cycle 2 Xeloda 08/13/2018 Cycle 3 Xeloda 09/03/2018 Cycle 4 Xeloda 09/24/2018 Restaging chest CT 10/12/2018- scattered pulmonary nodules/metastases mildly improved. Cycle 5 Xeloda 10/15/2018 Cycle 6 Xeloda 11/05/2018 Cycle 7 Xeloda 11/26/2018 Cycle 8 Xeloda 12/17/2018 Cycle 9 Xeloda 01/07/2019 CT chest 01/24/2019- stable pulmonary nodules.  No new pulmonary nodules.  Stable left hepatic lobe cysts and right hepatic lobe lesions.  No new hepatic lesions.  Stable right ninth and 10th rib lesions. Xeloda daily dosing schedule beginning 01/28/2019 due to hand-foot syndrome CT 05/27/2019- enlargement of lung metastases, similar right hepatic lobe lesion CT 08/05/2019-enlargement of bilateral lung nodules, no new lesions, stable right hepatic lesion Cycle 1 FOLFIRI 08/13/2019 Cycle 2 FOLFIRI 09/04/2019, Irinotecan dose reduced due to neutropenia Cycle 3 FOLFIRI 09/17/2019 Cycle 4 FOLFIRI 10/08/2019, 5-FU and irinotecan dose reduced CT chest 10/28/2019-bilateral pulmonary metastases with minimal improvement.  Stable ablation zone in the posterior right hepatic lobe. CT chest 03/03/2020-enlargement of pulmonary metastases, decreased right hepatic ablation site CT chest 07/07/2020-enlargement of pulmonary  metastases, stable ablation site involving right hepatic lobe Cycle 1 FOLFIRI/Avastin 07/27/2020 Cycle 2 FOLFIRI/Avastin 08/18/2020 Treatment held 09/08/2020 due to neutropenia Cycle 3 FOLFIRI/Avastin 09/17/2020 (irinotecan dose reduced due to neutropenia) Cycle 4 FOLFIRI/Avastin 10/06/2020 Chemotherapy discontinued per patient request CT chest 11/16/2020-decrease in size of dominant right perihilar nodule, other lung nodules are stable, stable right liver ablation site Right ovary cystadenofibroma, status post a bilateral oophorectomy 10/22/2013 Remote history of cervical cancer. Iron deficiency anemia-resolved. Right calf deep vein thrombosis 10/24/2013-she completed 3 months of xarelto. Indeterminate 12 mm hypermetabolic right pelvic density on the staging PET scan 10/04/2013-CT followup recommended per the GI tumor conference 11/13/2013. No longer visualized on a CT 07/14/2014. Soft tissue implant overlying the posterior right liver on the CT 81/27/5170-YFV hypermetabolic on the PET scan 49/44/9675. C. difficile colitis 03/05/2014. She completed a course of Flagyl, recurrent diarrhea beginning 03/21/2014, positive C. difficile toxin 03/26/2014-resumed Flagyl for planned 10 day course. Vancomycin beginning 04/08/2014. Taper complete 05/21/2014.   9.  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  Xeloda dose/schedule adjusted beginning 01/28/2019.        Disposition: Cynthia [name] appears stable.  She reconfirmed her decision to not receive further chemotherapy.  The plan is to continue observation.  She will return for an office visit and Port-A-Cath flush in 6 weeks.  [name], MD  05/24/2021  9:56 AM

## 2021-05-24 NOTE — Addendum Note (Signed)
Addended by: Tedd Sias on: 05/24/2021 09:17 AM   Modules accepted: Orders

## 2021-06-21 ENCOUNTER — Telehealth: Payer: Self-pay | Admitting: Nurse Practitioner

## 2021-06-21 NOTE — Telephone Encounter (Signed)
Called patient and left message about 07/05/21 appt . Wanted to see if patient would be able to reschedule to next day or another day.

## 2021-07-05 ENCOUNTER — Ambulatory Visit: Payer: Self-pay | Admitting: Nurse Practitioner

## 2021-07-12 ENCOUNTER — Inpatient Hospital Stay: Payer: Medicare PPO | Admitting: Nurse Practitioner

## 2021-07-12 ENCOUNTER — Inpatient Hospital Stay: Payer: Medicare PPO | Attending: Oncology

## 2021-07-12 ENCOUNTER — Encounter: Payer: Self-pay | Admitting: Nurse Practitioner

## 2021-07-12 ENCOUNTER — Other Ambulatory Visit: Payer: Self-pay

## 2021-07-12 VITALS — BP 154/79 | HR 53 | Temp 97.6°F | Resp 14 | Wt 125.2 lb

## 2021-07-12 DIAGNOSIS — C189 Malignant neoplasm of colon, unspecified: Secondary | ICD-10-CM

## 2021-07-12 DIAGNOSIS — C787 Secondary malignant neoplasm of liver and intrahepatic bile duct: Secondary | ICD-10-CM

## 2021-07-12 DIAGNOSIS — C180 Malignant neoplasm of cecum: Secondary | ICD-10-CM | POA: Diagnosis not present

## 2021-07-12 DIAGNOSIS — C7800 Secondary malignant neoplasm of unspecified lung: Secondary | ICD-10-CM | POA: Insufficient documentation

## 2021-07-12 NOTE — Progress Notes (Signed)
[place] OFFICE PROGRESS NOTE   Diagnosis: Colon cancer  INTERVAL HISTORY:   Ms. Norment returns as scheduled.  She overall feels well.  She has a good appetite.  Her weight is stable.  Intermittent dry cough and dyspnea on exertion unchanged.  No significant pain.  No bowel or bladder problems.  Objective:  Vital signs in last 24 hours:  Blood pressure (!) 154/79, pulse (!) 53, temperature 97.6 F (36.4 C), temperature source Temporal, resp. rate 14, weight 125 lb 3.2 oz (56.8 kg), SpO2 98 %.    Lymphatics: No palpable cervical, supraclavicular or axillary lymph nodes. Resp: Lungs clear bilaterally. Cardio: Regular rate and rhythm. GI: No hepatosplenomegaly.  Firm fullness left upper abdomen medially. Vascular: No leg edema.  Right lower leg is slightly larger than the left lower leg. Port-A-Cath without erythema.  Lab Results:  Lab Results  Component Value Date   WBC 2.9 (L) 10/27/2020   HGB 11.0 (L) 10/27/2020   HCT 34.9 (L) 10/27/2020   MCV 93.1 10/27/2020   PLT 172 10/27/2020   NEUTROABS 1.3 (L) 10/27/2020    Imaging:  No results found.  Medications: I have reviewed the patient's current medications.  Assessment/Plan: Moderately differentiated adenocarcinoma of the cecum, stage III (T4a, N1), status post a laparoscopic assisted right colectomy 10/22/2013 The tumor returned microsatellite stable with normal mismatch repair protein expression   Cycle 1 adjuvant Xeloda 12/25/2013.   Cycle 2 adjuvant Xeloda 01/15/2014.   Cycle 3 adjuvant Xeloda 02/05/2014.   Cycle 4 adjuvant Xeloda 03/18/2014, discontinued after 4 days secondary to diarrhea   Cycle 5 adjuvant Xeloda 04/16/2014, Xeloda dose reduced to 1000 mg in the a.m. and 500 mg in the p.m.   Cycle 6 adjuvant Xeloda 05/07/2014. Cycle 7 adjuvant Xeloda 05/28/2014. Cycle 8 adjuvant Xeloda 06/18/2014. Restaging CTs on 07/14/2014 with no evidence of metastatic colon cancer Colonoscopy  10/02/2014 with diverticulosis in the sigmoid colon and in the descending colon. One 4 mm polyp in the sigmoid colon with complete resection. Polyp tissue not retrieved. Internal hemorrhoids. Next colonoscopy in 3 years for surveillance. Restaging CTs 07/16/2015 with 2 new hypodense liver lesions concerning for metastases, no other evidence of disease progression PET scan 07/28/2015 with a single hypermetabolic right liver lesion, no other evidence of metastatic disease Ultrasound-guided biopsy of the right liver lesion 07/30/2015 confirmed metastatic colon cancer, MSS, tumor mutation burden 1, KRAS G12D, PIK3CA, PTEN MRI abdomen 08/20/2015 with similar to slight decrease in size of 2 hepatic metastasis. No new liver lesions or extrahepatic metastatic disease identified. Radiofrequency ablation of 2 liver lesions 09/25/2015 MRI abdomen 02/02/2016-coagulative necrosis at the 2 ablated liver tumor sites, with no enhancement to suggest residual or recurrent liver tumor. No new liver masses or other findings of metastatic disease in the abdomen. MRI abdomen 08/10/2016 revealed stable ablation sites, no evidence of progressive metastatic disease Restaging CT 02/08/2017-stable treated liver lesions, no evidence of progressive metastatic disease, stable borderline enlarged right inguinal node  CEA with stable mild elevation 02/08/2017, 03/01/2017, 03/29/2017, 04/19/2017 CEA further elevated 08/21/2017 CTs 09/05/2017- possible local recurrence of tumor along the ablation site right hepatic lobe lesion.  Cephalad to the ablation site approximately 1.9 x 2.1 cm soft tissue mass progressive compared to the prior exam. Liver SBRT 10/17/2017, 10/19/2017, 10/23/2017, 10/25/2017, 10/27/2017 CT 02/19/2018- progressive ill-defined areas of low attenuation with enhancement surrounding the segment 6 ablation site, stable segment 8 ablation site enlargement of extracapsular lesion posteriorly, no new hepatic lesions, wall  thickening/edema at  the distal ileum CT 05/22/2018- 2 new lung nodules suspicious for metastases, stable segment 8 ablation zone, slight decrease in the size of delayed perfusion at the segment 6 ablation zone, posterior extracapsular lesion is slightly smaller CT chest 07/13/2018- multiple lung nodules consistent with metastatic disease Cycle 1 Xeloda 07/23/2018 Cycle 2 Xeloda 08/13/2018 Cycle 3 Xeloda 09/03/2018 Cycle 4 Xeloda 09/24/2018 Restaging chest CT 10/12/2018- scattered pulmonary nodules/metastases mildly improved. Cycle 5 Xeloda 10/15/2018 Cycle 6 Xeloda 11/05/2018 Cycle 7 Xeloda 11/26/2018 Cycle 8 Xeloda 12/17/2018 Cycle 9 Xeloda 01/07/2019 CT chest 01/24/2019- stable pulmonary nodules.  No new pulmonary nodules.  Stable left hepatic lobe cysts and right hepatic lobe lesions.  No new hepatic lesions.  Stable right ninth and 10th rib lesions. Xeloda daily dosing schedule beginning 01/28/2019 due to hand-foot syndrome CT 05/27/2019- enlargement of lung metastases, similar right hepatic lobe lesion CT 08/05/2019-enlargement of bilateral lung nodules, no new lesions, stable right hepatic lesion Cycle 1 FOLFIRI 08/13/2019 Cycle 2 FOLFIRI 09/04/2019, Irinotecan dose reduced due to neutropenia Cycle 3 FOLFIRI 09/17/2019 Cycle 4 FOLFIRI 10/08/2019, 5-FU and irinotecan dose reduced CT chest 10/28/2019-bilateral pulmonary metastases with minimal improvement.  Stable ablation zone in the posterior right hepatic lobe. CT chest 03/03/2020-enlargement of pulmonary metastases, decreased right hepatic ablation site CT chest 07/07/2020-enlargement of pulmonary metastases, stable ablation site involving right hepatic lobe Cycle 1 FOLFIRI/Avastin 07/27/2020 Cycle 2 FOLFIRI/Avastin 08/18/2020 Treatment held 09/08/2020 due to neutropenia Cycle 3 FOLFIRI/Avastin 09/17/2020 (irinotecan dose reduced due to neutropenia) Cycle 4 FOLFIRI/Avastin 10/06/2020 Chemotherapy discontinued per patient request CT chest  11/16/2020-decrease in size of dominant right perihilar nodule, other lung nodules are stable, stable right liver ablation site Right ovary cystadenofibroma, status post a bilateral oophorectomy 10/22/2013 Remote history of cervical cancer. Iron deficiency anemia-resolved. Right calf deep vein thrombosis 10/24/2013-she completed 3 months of xarelto. Indeterminate 12 mm hypermetabolic right pelvic density on the staging PET scan 10/04/2013-CT followup recommended per the GI tumor conference 11/13/2013. No longer visualized on a CT 07/14/2014. Soft tissue implant overlying the posterior right liver on the CT 35/59/7416-LAG hypermetabolic on the PET scan 53/64/6803. C. difficile colitis 03/05/2014. She completed a course of Flagyl, recurrent diarrhea beginning 03/21/2014, positive C. difficile toxin 03/26/2014-resumed Flagyl for planned 10 day course. Vancomycin beginning 04/08/2014. Taper complete 05/21/2014.   9.  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  Xeloda dose/schedule adjusted beginning 01/28/2019.    Disposition: Cynthia [name] appears stable.  There is no clinical evidence of disease progression.  Plan to continue to follow with observation.  She will return for a Port-A-Cath flush and follow-up visit in 6 weeks.  She will contact the office in the interim with any problems.    Ned Card ANP/GNP-BC   07/12/2021  1:47 PM

## 2021-08-23 ENCOUNTER — Encounter: Payer: Self-pay | Admitting: Oncology

## 2021-08-23 ENCOUNTER — Other Ambulatory Visit: Payer: Self-pay

## 2021-08-23 ENCOUNTER — Inpatient Hospital Stay: Payer: Medicare PPO | Admitting: Oncology

## 2021-08-23 ENCOUNTER — Inpatient Hospital Stay: Payer: Medicare PPO | Attending: Oncology

## 2021-08-23 ENCOUNTER — Inpatient Hospital Stay: Payer: Medicare PPO

## 2021-08-23 VITALS — BP 157/84 | HR 70 | Temp 98.1°F | Resp 20 | Ht 61.0 in | Wt 123.0 lb

## 2021-08-23 DIAGNOSIS — C7801 Secondary malignant neoplasm of right lung: Secondary | ICD-10-CM | POA: Insufficient documentation

## 2021-08-23 DIAGNOSIS — C180 Malignant neoplasm of cecum: Secondary | ICD-10-CM | POA: Insufficient documentation

## 2021-08-23 DIAGNOSIS — C189 Malignant neoplasm of colon, unspecified: Secondary | ICD-10-CM | POA: Diagnosis not present

## 2021-08-23 DIAGNOSIS — C787 Secondary malignant neoplasm of liver and intrahepatic bile duct: Secondary | ICD-10-CM

## 2021-08-23 DIAGNOSIS — R059 Cough, unspecified: Secondary | ICD-10-CM | POA: Insufficient documentation

## 2021-08-23 DIAGNOSIS — C7802 Secondary malignant neoplasm of left lung: Secondary | ICD-10-CM | POA: Diagnosis not present

## 2021-08-23 DIAGNOSIS — R970 Elevated carcinoembryonic antigen [CEA]: Secondary | ICD-10-CM | POA: Insufficient documentation

## 2021-08-23 LAB — CEA (ACCESS): CEA (CHCC): 5.48 ng/mL — ABNORMAL HIGH (ref 0.00–5.00)

## 2021-08-23 NOTE — Progress Notes (Signed)
[place] OFFICE PROGRESS NOTE   Diagnosis: Colon cancer  INTERVAL HISTORY:   Cynthia [name] returns as scheduled.  She feels well.  No change in leg discomfort.  She developed a "virus "during the holidays with rhinorrhea and a nonproductive cough.  She continues to have a mild cough.  No fever.  Objective:  Vital signs in last 24 hours:  Blood pressure (!) 157/84, pulse 70, temperature 98.1 F (36.7 C), temperature source Oral, resp. rate 20, height 5' 1"  (1.549 m), weight 123 lb (55.8 kg), SpO2 98 %.    Resp: Left greater than right end inspiratory rhonchi/wheeze that cleared after several respirations, no respiratory distress Cardio: Regular rate and rhythm GI: No hepatosplenomegaly Vascular: No leg edema Skin: Area of mild induration at the right lateral chest wall without a discrete mass  Portacath/PICC-without erythema  Lab Results:  Lab Results  Component Value Date   WBC 2.9 (L) 10/27/2020   HGB 11.0 (L) 10/27/2020   HCT 34.9 (L) 10/27/2020   MCV 93.1 10/27/2020   PLT 172 10/27/2020   NEUTROABS 1.3 (L) 10/27/2020    CMP  Lab Results  Component Value Date   NA 143 10/27/2020   K 4.0 10/27/2020   CL 108 10/27/2020   CO2 27 10/27/2020   GLUCOSE 84 10/27/2020   BUN 13 10/27/2020   CREATININE 0.82 10/27/2020   CALCIUM 8.7 (L) 10/27/2020   PROT 6.3 (L) 10/27/2020   ALBUMIN 3.4 (L) 10/27/2020   AST 21 10/27/2020   ALT 13 10/27/2020   ALKPHOS 45 10/27/2020   BILITOT 0.4 10/27/2020   GFRNONAA >60 10/27/2020   GFRAA >60 10/08/2019    Lab Results  Component Value Date   CEA1 4.27 05/24/2021   CEA 5.48 (H) 08/23/2021    Medications: I have reviewed the patient's current medications.   Assessment/Plan: Moderately differentiated adenocarcinoma of the cecum, stage III (T4a, N1), status post a laparoscopic assisted right colectomy 10/22/2013 The tumor returned microsatellite stable with normal mismatch repair protein expression   Cycle  1 adjuvant Xeloda 12/25/2013.   Cycle 2 adjuvant Xeloda 01/15/2014.   Cycle 3 adjuvant Xeloda 02/05/2014.   Cycle 4 adjuvant Xeloda 03/18/2014, discontinued after 4 days secondary to diarrhea   Cycle 5 adjuvant Xeloda 04/16/2014, Xeloda dose reduced to 1000 mg in the a.m. and 500 mg in the p.m.   Cycle 6 adjuvant Xeloda 05/07/2014. Cycle 7 adjuvant Xeloda 05/28/2014. Cycle 8 adjuvant Xeloda 06/18/2014. Restaging CTs on 07/14/2014 with no evidence of metastatic colon cancer Colonoscopy 10/02/2014 with diverticulosis in the sigmoid colon and in the descending colon. One 4 mm polyp in the sigmoid colon with complete resection. Polyp tissue not retrieved. Internal hemorrhoids. Next colonoscopy in 3 years for surveillance. Restaging CTs 07/16/2015 with 2 new hypodense liver lesions concerning for metastases, no other evidence of disease progression PET scan 07/28/2015 with a single hypermetabolic right liver lesion, no other evidence of metastatic disease Ultrasound-guided biopsy of the right liver lesion 07/30/2015 confirmed metastatic colon cancer, MSS, tumor mutation burden 1, KRAS G12D, PIK3CA, PTEN MRI abdomen 08/20/2015 with similar to slight decrease in size of 2 hepatic metastasis. No new liver lesions or extrahepatic metastatic disease identified. Radiofrequency ablation of 2 liver lesions 09/25/2015 MRI abdomen 02/02/2016-coagulative necrosis at the 2 ablated liver tumor sites, with no enhancement to suggest residual or recurrent liver tumor. No new liver masses or other findings of metastatic disease in the abdomen. MRI abdomen 08/10/2016 revealed stable ablation sites, no evidence of progressive  metastatic disease Restaging CT 02/08/2017-stable treated liver lesions, no evidence of progressive metastatic disease, stable borderline enlarged right inguinal node  CEA with stable mild elevation 02/08/2017, 03/01/2017, 03/29/2017, 04/19/2017 CEA further elevated 08/21/2017 CTs 09/05/2017- possible  local recurrence of tumor along the ablation site right hepatic lobe lesion.  Cephalad to the ablation site approximately 1.9 x 2.1 cm soft tissue mass progressive compared to the prior exam. Liver SBRT 10/17/2017, 10/19/2017, 10/23/2017, 10/25/2017, 10/27/2017 CT 02/19/2018- progressive ill-defined areas of low attenuation with enhancement surrounding the segment 6 ablation site, stable segment 8 ablation site enlargement of extracapsular lesion posteriorly, no new hepatic lesions, wall thickening/edema at the distal ileum CT 05/22/2018- 2 new lung nodules suspicious for metastases, stable segment 8 ablation zone, slight decrease in the size of delayed perfusion at the segment 6 ablation zone, posterior extracapsular lesion is slightly smaller CT chest 07/13/2018- multiple lung nodules consistent with metastatic disease Cycle 1 Xeloda 07/23/2018 Cycle 2 Xeloda 08/13/2018 Cycle 3 Xeloda 09/03/2018 Cycle 4 Xeloda 09/24/2018 Restaging chest CT 10/12/2018- scattered pulmonary nodules/metastases mildly improved. Cycle 5 Xeloda 10/15/2018 Cycle 6 Xeloda 11/05/2018 Cycle 7 Xeloda 11/26/2018 Cycle 8 Xeloda 12/17/2018 Cycle 9 Xeloda 01/07/2019 CT chest 01/24/2019- stable pulmonary nodules.  No new pulmonary nodules.  Stable left hepatic lobe cysts and right hepatic lobe lesions.  No new hepatic lesions.  Stable right ninth and 10th rib lesions. Xeloda daily dosing schedule beginning 01/28/2019 due to hand-foot syndrome CT 05/27/2019- enlargement of lung metastases, similar right hepatic lobe lesion CT 08/05/2019-enlargement of bilateral lung nodules, no new lesions, stable right hepatic lesion Cycle 1 FOLFIRI 08/13/2019 Cycle 2 FOLFIRI 09/04/2019, Irinotecan dose reduced due to neutropenia Cycle 3 FOLFIRI 09/17/2019 Cycle 4 FOLFIRI 10/08/2019, 5-FU and irinotecan dose reduced CT chest 10/28/2019-bilateral pulmonary metastases with minimal improvement.  Stable ablation zone in the posterior right hepatic lobe. CT chest  03/03/2020-enlargement of pulmonary metastases, decreased right hepatic ablation site CT chest 07/07/2020-enlargement of pulmonary metastases, stable ablation site involving right hepatic lobe Cycle 1 FOLFIRI/Avastin 07/27/2020 Cycle 2 FOLFIRI/Avastin 08/18/2020 Treatment held 09/08/2020 due to neutropenia Cycle 3 FOLFIRI/Avastin 09/17/2020 (irinotecan dose reduced due to neutropenia) Cycle 4 FOLFIRI/Avastin 10/06/2020 Chemotherapy discontinued per patient request CT chest 11/16/2020-decrease in size of dominant right perihilar nodule, other lung nodules are stable, stable right liver ablation site Right ovary cystadenofibroma, status post a bilateral oophorectomy 10/22/2013 Remote history of cervical cancer. Iron deficiency anemia-resolved. Right calf deep vein thrombosis 10/24/2013-she completed 3 months of xarelto. Indeterminate 12 mm hypermetabolic right pelvic density on the staging PET scan 10/04/2013-CT followup recommended per the GI tumor conference 11/13/2013. No longer visualized on a CT 07/14/2014. Soft tissue implant overlying the posterior right liver on the CT 82/42/3536-RWE hypermetabolic on the PET scan 31/54/0086. C. difficile colitis 03/05/2014. She completed a course of Flagyl, recurrent diarrhea beginning 03/21/2014, positive C. difficile toxin 03/26/2014-resumed Flagyl for planned 10 day course. Vancomycin beginning 04/08/2014. Taper complete 05/21/2014.   9.  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  Xeloda dose/schedule adjusted beginning 01/28/2019.      Disposition: Cynthia [name] appears unchanged.  The CEA remains mildly elevated.  There is no clinical evidence of disease progression.  She will call for new symptoms.  She will return for an office visit and Port-A-Cath flush in 6 weeks.  We do not plan for restaging CTs unless she has clinical evidence of disease progression.  [name], MD  08/23/2021  11:41 AM

## 2021-09-10 DIAGNOSIS — E539 Vitamin B deficiency, unspecified: Secondary | ICD-10-CM | POA: Diagnosis not present

## 2021-09-10 DIAGNOSIS — Z0000 Encounter for general adult medical examination without abnormal findings: Secondary | ICD-10-CM | POA: Diagnosis not present

## 2021-09-10 DIAGNOSIS — I340 Nonrheumatic mitral (valve) insufficiency: Secondary | ICD-10-CM | POA: Diagnosis not present

## 2021-09-10 DIAGNOSIS — E785 Hyperlipidemia, unspecified: Secondary | ICD-10-CM | POA: Diagnosis not present

## 2021-09-10 DIAGNOSIS — C785 Secondary malignant neoplasm of large intestine and rectum: Secondary | ICD-10-CM | POA: Diagnosis not present

## 2021-09-10 DIAGNOSIS — I6523 Occlusion and stenosis of bilateral carotid arteries: Secondary | ICD-10-CM | POA: Diagnosis not present

## 2021-09-10 DIAGNOSIS — E559 Vitamin D deficiency, unspecified: Secondary | ICD-10-CM | POA: Diagnosis not present

## 2021-09-10 DIAGNOSIS — Z79899 Other long term (current) drug therapy: Secondary | ICD-10-CM | POA: Diagnosis not present

## 2021-09-10 DIAGNOSIS — G8194 Hemiplegia, unspecified affecting left nondominant side: Secondary | ICD-10-CM | POA: Diagnosis not present

## 2021-09-10 DIAGNOSIS — I700 Atherosclerosis of aorta: Secondary | ICD-10-CM | POA: Diagnosis not present

## 2021-09-10 DIAGNOSIS — I10 Essential (primary) hypertension: Secondary | ICD-10-CM | POA: Diagnosis not present

## 2021-10-06 ENCOUNTER — Inpatient Hospital Stay: Payer: Medicare PPO | Attending: Oncology | Admitting: Nurse Practitioner

## 2021-10-06 ENCOUNTER — Other Ambulatory Visit: Payer: Self-pay

## 2021-10-06 ENCOUNTER — Inpatient Hospital Stay: Payer: Medicare PPO

## 2021-10-06 ENCOUNTER — Encounter: Payer: Self-pay | Admitting: Nurse Practitioner

## 2021-10-06 VITALS — BP 152/80 | HR 97 | Temp 97.8°F | Resp 19 | Ht 61.0 in | Wt 122.8 lb

## 2021-10-06 DIAGNOSIS — C180 Malignant neoplasm of cecum: Secondary | ICD-10-CM | POA: Diagnosis not present

## 2021-10-06 DIAGNOSIS — C189 Malignant neoplasm of colon, unspecified: Secondary | ICD-10-CM | POA: Diagnosis not present

## 2021-10-06 DIAGNOSIS — Z95828 Presence of other vascular implants and grafts: Secondary | ICD-10-CM

## 2021-10-06 DIAGNOSIS — C787 Secondary malignant neoplasm of liver and intrahepatic bile duct: Secondary | ICD-10-CM

## 2021-10-06 LAB — CEA (ACCESS): CEA (CHCC): 12.99 ng/mL — ABNORMAL HIGH (ref 0.00–5.00)

## 2021-10-06 MED ORDER — SODIUM CHLORIDE 0.9% FLUSH
10.0000 mL | INTRAVENOUS | Status: DC | PRN
  Administered 2021-10-06: 10 mL

## 2021-10-06 MED ORDER — HEPARIN SOD (PORK) LOCK FLUSH 100 UNIT/ML IV SOLN
500.0000 [IU] | Freq: Once | INTRAVENOUS | Status: AC | PRN
  Administered 2021-10-06: 500 [IU]

## 2021-10-06 NOTE — Progress Notes (Signed)
Cynthia [name] OFFICE PROGRESS NOTE   Diagnosis: Colon cancer  INTERVAL HISTORY:   Cynthia [name] returns as scheduled.  She feels well.  She has a good appetite.  No nausea or vomiting.  Bowels moving regularly.  She denies pain.  She occasionally notes an abnormal sensation along the right lateral abdomen.  Objective:  Vital signs in last 24 hours:  Blood pressure (!) 152/80, pulse 97, temperature 97.8 F (36.6 C), resp. rate 19, height _0  (1.549 m), weight 122 lb 12.8 oz (55.7 kg), SpO2 100 %.    Lymphatics: No palpable cervical, supraclavicular, axillary or inguinal lymph nodes. Resp: Lungs clear bilaterally. Cardio: Regular rate and rhythm. GI: Abdomen soft and nontender.  No hepatomegaly. Vascular: No leg edema. Skin: No significant induration right lateral chest wall. Port-A-Cath without erythema.  Lab Results:  Lab Results  Component Value Date   WBC 2.9 (L) 10/27/2020   HGB 11.0 (L) 10/27/2020   HCT 34.9 (L) 10/27/2020   MCV 93.1 10/27/2020   PLT 172 10/27/2020   NEUTROABS 1.3 (L) 10/27/2020    Imaging:  No results found.  Medications: I have reviewed the patient's current medications.  Assessment/Plan: Moderately differentiated adenocarcinoma of the cecum, stage III (T4a, N1), status post a laparoscopic assisted right colectomy 10/22/2013 The tumor returned microsatellite stable with normal mismatch repair protein expression   Cycle 1 adjuvant Xeloda 12/25/2013.   Cycle 2 adjuvant Xeloda 01/15/2014.   Cycle 3 adjuvant Xeloda 02/05/2014.   Cycle 4 adjuvant Xeloda 03/18/2014, discontinued after 4 days secondary to diarrhea   Cycle 5 adjuvant Xeloda 04/16/2014, Xeloda dose reduced to 1000 mg in the a.m. and 500 mg in the p.m.   Cycle 6 adjuvant Xeloda 05/07/2014. Cycle 7 adjuvant Xeloda 05/28/2014. Cycle 8 adjuvant Xeloda 06/18/2014. Restaging CTs on 07/14/2014 with no evidence of metastatic colon cancer Colonoscopy 10/02/2014 with  diverticulosis in the sigmoid colon and in the descending colon. One 4 mm polyp in the sigmoid colon with complete resection. Polyp tissue not retrieved. Internal hemorrhoids. Next colonoscopy in 3 years for surveillance. Restaging CTs 07/16/2015 with 2 new hypodense liver lesions concerning for metastases, no other evidence of disease progression PET scan 07/28/2015 with a single hypermetabolic right liver lesion, no other evidence of metastatic disease Ultrasound-guided biopsy of the right liver lesion 07/30/2015 confirmed metastatic colon cancer, MSS, tumor mutation burden 1, KRAS G12D, PIK3CA, PTEN MRI abdomen 08/20/2015 with similar to slight decrease in size of 2 hepatic metastasis. No new liver lesions or extrahepatic metastatic disease identified. Radiofrequency ablation of 2 liver lesions 09/25/2015 MRI abdomen 02/02/2016-coagulative necrosis at the 2 ablated liver tumor sites, with no enhancement to suggest residual or recurrent liver tumor. No new liver masses or other findings of metastatic disease in the abdomen. MRI abdomen 08/10/2016 revealed stable ablation sites, no evidence of progressive metastatic disease Restaging CT 02/08/2017-stable treated liver lesions, no evidence of progressive metastatic disease, stable borderline enlarged right inguinal node  CEA with stable mild elevation 02/08/2017, 03/01/2017, 03/29/2017, 04/19/2017 CEA further elevated 08/21/2017 CTs 09/05/2017- possible local recurrence of tumor along the ablation site right hepatic lobe lesion.  Cephalad to the ablation site approximately 1.9 x 2.1 cm soft tissue mass progressive compared to the prior exam. Liver SBRT 10/17/2017, 10/19/2017, 10/23/2017, 10/25/2017, 10/27/2017 CT 02/19/2018- progressive ill-defined areas of low attenuation with enhancement surrounding the segment 6 ablation site, stable segment 8 ablation site enlargement of extracapsular lesion posteriorly, no new hepatic lesions, wall thickening/edema at the distal  ileum CT  05/22/2018- 2 new lung nodules suspicious for metastases, stable segment 8 ablation zone, slight decrease in the size of delayed perfusion at the segment 6 ablation zone, posterior extracapsular lesion is slightly smaller CT chest 07/13/2018- multiple lung nodules consistent with metastatic disease Cycle 1 Xeloda 07/23/2018 Cycle 2 Xeloda 08/13/2018 Cycle 3 Xeloda 09/03/2018 Cycle 4 Xeloda 09/24/2018 Restaging chest CT 10/12/2018- scattered pulmonary nodules/metastases mildly improved. Cycle 5 Xeloda 10/15/2018 Cycle 6 Xeloda 11/05/2018 Cycle 7 Xeloda 11/26/2018 Cycle 8 Xeloda 12/17/2018 Cycle 9 Xeloda 01/07/2019 CT chest 01/24/2019- stable pulmonary nodules.  No new pulmonary nodules.  Stable left hepatic lobe cysts and right hepatic lobe lesions.  No new hepatic lesions.  Stable right ninth and 10th rib lesions. Xeloda daily dosing schedule beginning 01/28/2019 due to hand-foot syndrome CT 05/27/2019- enlargement of lung metastases, similar right hepatic lobe lesion CT 08/05/2019-enlargement of bilateral lung nodules, no new lesions, stable right hepatic lesion Cycle 1 FOLFIRI 08/13/2019 Cycle 2 FOLFIRI 09/04/2019, Irinotecan dose reduced due to neutropenia Cycle 3 FOLFIRI 09/17/2019 Cycle 4 FOLFIRI 10/08/2019, 5-FU and irinotecan dose reduced CT chest 10/28/2019-bilateral pulmonary metastases with minimal improvement.  Stable ablation zone in the posterior right hepatic lobe. CT chest 03/03/2020-enlargement of pulmonary metastases, decreased right hepatic ablation site CT chest 07/07/2020-enlargement of pulmonary metastases, stable ablation site involving right hepatic lobe Cycle 1 FOLFIRI/Avastin 07/27/2020 Cycle 2 FOLFIRI/Avastin 08/18/2020 Treatment held 09/08/2020 due to neutropenia Cycle 3 FOLFIRI/Avastin 09/17/2020 (irinotecan dose reduced due to neutropenia) Cycle 4 FOLFIRI/Avastin 10/06/2020 Chemotherapy discontinued per patient request CT chest 11/16/2020-decrease in size of dominant right  perihilar nodule, other lung nodules are stable, stable right liver ablation site Right ovary cystadenofibroma, status post a bilateral oophorectomy 10/22/2013 Remote history of cervical cancer. Iron deficiency anemia-resolved. Right calf deep vein thrombosis 10/24/2013-she completed 3 months of xarelto. Indeterminate 12 mm hypermetabolic right pelvic density on the staging PET scan 10/04/2013-CT followup recommended per the GI tumor conference 11/13/2013. No longer visualized on a CT 07/14/2014. Soft tissue implant overlying the posterior right liver on the CT 89/38/1017-PZW hypermetabolic on the PET scan 25/85/2778. C. difficile colitis 03/05/2014. She completed a course of Flagyl, recurrent diarrhea beginning 03/21/2014, positive C. difficile toxin 03/26/2014-resumed Flagyl for planned 10 day course. Vancomycin beginning 04/08/2014. Taper complete 05/21/2014.   9.  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  Xeloda dose/schedule adjusted beginning 01/28/2019.      Disposition: Ms. Helinski appears stable.  There is no clinical evidence of disease progression.  We will follow-up on the CEA from today.  She will return for a port flush and office visit in 6 weeks.  She will contact the office in the interim with any problems.    Ned Card ANP/GNP-BC   10/06/2021  11:13 AM

## 2021-10-07 ENCOUNTER — Telehealth: Payer: Self-pay

## 2021-10-07 NOTE — Telephone Encounter (Signed)
-----   Message from Owens Shark, NP sent at 10/06/2021  3:49 PM EST ----- Please let her know the CEA is higher.  We will repeat at her next visit.

## 2021-10-07 NOTE — Telephone Encounter (Signed)
Called the patient to her know the CEA is higher, and we will repeat at her next visit.

## 2021-11-17 ENCOUNTER — Inpatient Hospital Stay: Payer: Medicare PPO

## 2021-11-17 ENCOUNTER — Inpatient Hospital Stay: Payer: Medicare PPO | Attending: Oncology

## 2021-11-17 ENCOUNTER — Other Ambulatory Visit: Payer: Self-pay

## 2021-11-17 ENCOUNTER — Inpatient Hospital Stay: Payer: Medicare PPO | Admitting: Oncology

## 2021-11-17 VITALS — BP 126/69 | HR 68 | Temp 97.8°F | Resp 18 | Ht 61.0 in | Wt 120.8 lb

## 2021-11-17 DIAGNOSIS — C189 Malignant neoplasm of colon, unspecified: Secondary | ICD-10-CM | POA: Diagnosis not present

## 2021-11-17 DIAGNOSIS — C787 Secondary malignant neoplasm of liver and intrahepatic bile duct: Secondary | ICD-10-CM

## 2021-11-17 DIAGNOSIS — Z95828 Presence of other vascular implants and grafts: Secondary | ICD-10-CM

## 2021-11-17 DIAGNOSIS — C180 Malignant neoplasm of cecum: Secondary | ICD-10-CM | POA: Insufficient documentation

## 2021-11-17 DIAGNOSIS — C7801 Secondary malignant neoplasm of right lung: Secondary | ICD-10-CM | POA: Diagnosis not present

## 2021-11-17 DIAGNOSIS — C7802 Secondary malignant neoplasm of left lung: Secondary | ICD-10-CM | POA: Diagnosis not present

## 2021-11-17 LAB — CEA (ACCESS): CEA (CHCC): 7.53 ng/mL — ABNORMAL HIGH (ref 0.00–5.00)

## 2021-11-17 MED ORDER — GABAPENTIN 400 MG PO CAPS: ORAL_CAPSULE | ORAL | 5 refills | Status: DC

## 2021-11-17 MED ORDER — SODIUM CHLORIDE 0.9% FLUSH
10.0000 mL | INTRAVENOUS | Status: DC | PRN
  Administered 2021-11-17: 10 mL

## 2021-11-17 MED ORDER — HEPARIN SOD (PORK) LOCK FLUSH 100 UNIT/ML IV SOLN
500.0000 [IU] | Freq: Once | INTRAVENOUS | Status: AC | PRN
  Administered 2021-11-17: 500 [IU]

## 2021-11-17 NOTE — Progress Notes (Signed)
?Cynthia [name] ?OFFICE PROGRESS NOTE ? ? ?Diagnosis: Colon cancer ? ?INTERVAL HISTORY:  ? ?Cynthia [name] returns as scheduled.  She feels well.  She has an intermittent cough since having an upper respiratory infection in December.  No fever.  She has mild exertional dyspnea.  She does not have an appetite for a full meal, but eats ice cream and brownies.  No pain. ? ?Objective: ? ?Vital signs in last 24 hours: ? ?Blood pressure 126/69, pulse 68, temperature 97.8 ?F (36.6 ?C), temperature source Oral, resp. rate 18, height _0  (1.549 m), weight 120 lb 12.8 oz (54.8 kg), SpO2 100 %. ?  ? ?Resp: Lungs clear bilaterally ?Cardio: Regular rate and rhythm ?GI: No hepatosplenomegaly, rounded fullness in the right upper abdomen ?Vascular: No leg edema  ?Skin: Area of mild skin thickening and induration at the right lateral chest wall ? ?Portacath/PICC-without erythema ? ?Lab Results: ? ?Lab Results  ?Component Value Date  ? WBC 2.9 (L) 10/27/2020  ? HGB 11.0 (L) 10/27/2020  ? HCT 34.9 (L) 10/27/2020  ? MCV 93.1 10/27/2020  ? PLT 172 10/27/2020  ? NEUTROABS 1.3 (L) 10/27/2020  ? ? ?CMP  ?Lab Results  ?Component Value Date  ? NA 143 10/27/2020  ? K 4.0 10/27/2020  ? CL 108 10/27/2020  ? CO2 27 10/27/2020  ? GLUCOSE 84 10/27/2020  ? BUN 13 10/27/2020  ? CREATININE 0.82 10/27/2020  ? CALCIUM 8.7 (L) 10/27/2020  ? PROT 6.3 (L) 10/27/2020  ? ALBUMIN 3.4 (L) 10/27/2020  ? AST 21 10/27/2020  ? ALT 13 10/27/2020  ? ALKPHOS 45 10/27/2020  ? BILITOT 0.4 10/27/2020  ? GFRNONAA >60 10/27/2020  ? GFRAA >60 10/08/2019  ? ? ?Lab Results  ?Component Value Date  ? CEA1 4.27 05/24/2021  ? CEA 7.53 (H) 11/17/2021  ? ? ? ? ?Medications: I have reviewed the patient's current medications. ? ? ?Assessment/Plan: ?Moderately differentiated adenocarcinoma of the cecum, stage III (T4a, N1), status post a laparoscopic assisted right colectomy 10/22/2013 ?The tumor returned microsatellite stable with normal mismatch repair protein  expression   ?Cycle 1 adjuvant Xeloda 12/25/2013.   ?Cycle 2 adjuvant Xeloda 01/15/2014.   ?Cycle 3 adjuvant Xeloda 02/05/2014.   ?Cycle 4 adjuvant Xeloda 03/18/2014, discontinued after 4 days secondary to diarrhea   ?Cycle 5 adjuvant Xeloda 04/16/2014, Xeloda dose reduced to 1000 mg in the a.m. and 500 mg in the p.m.   ?Cycle 6 adjuvant Xeloda 05/07/2014. ?Cycle 7 adjuvant Xeloda 05/28/2014. ?Cycle 8 adjuvant Xeloda 06/18/2014. ?Restaging CTs on 07/14/2014 with no evidence of metastatic colon cancer ?Colonoscopy 10/02/2014 with diverticulosis in the sigmoid colon and in the descending colon. One 4 mm polyp in the sigmoid colon with complete resection. Polyp tissue not retrieved. Internal hemorrhoids. Next colonoscopy in 3 years for surveillance. ?Restaging CTs 07/16/2015 with 2 new hypodense liver lesions concerning for metastases, no other evidence of disease progression ?PET scan 07/28/2015 with a single hypermetabolic right liver lesion, no other evidence of metastatic disease ?Ultrasound-guided biopsy of the right liver lesion 07/30/2015 confirmed metastatic colon cancer, MSS, tumor mutation burden 1, KRAS G12D, PIK3CA, PTEN ?MRI abdomen 08/20/2015 with similar to slight decrease in size of 2 hepatic metastasis. No new liver lesions or extrahepatic metastatic disease identified. ?Radiofrequency ablation of 2 liver lesions 09/25/2015 ?MRI abdomen 02/02/2016-coagulative necrosis at the 2 ablated liver tumor sites, with no enhancement to suggest residual or recurrent liver tumor. No new liver masses or other findings of metastatic disease in the abdomen. ?MRI abdomen  08/10/2016 revealed stable ablation sites, no evidence of progressive metastatic disease ?Restaging CT 02/08/2017-stable treated liver lesions, no evidence of progressive metastatic disease, stable borderline enlarged right inguinal node  ?CEA with stable mild elevation 02/08/2017, 03/01/2017, 03/29/2017, 04/19/2017 ?CEA further elevated 08/21/2017 ?CTs  09/05/2017- possible local recurrence of tumor along the ablation site right hepatic lobe lesion.  Cephalad to the ablation site approximately 1.9 x 2.1 cm soft tissue mass progressive compared to the prior exam. ?Liver SBRT 10/17/2017, 10/19/2017, 10/23/2017, 10/25/2017, 10/27/2017 ?CT 02/19/2018- progressive ill-defined areas of low attenuation with enhancement surrounding the segment 6 ablation site, stable segment 8 ablation site enlargement of extracapsular lesion posteriorly, no new hepatic lesions, wall thickening/edema at the distal ileum ?CT 05/22/2018- 2 new lung nodules suspicious for metastases, stable segment 8 ablation zone, slight decrease in the size of delayed perfusion at the segment 6 ablation zone, posterior extracapsular lesion is slightly smaller ?CT chest 07/13/2018- multiple lung nodules consistent with metastatic disease ?Cycle 1 Xeloda 07/23/2018 ?Cycle 2 Xeloda 08/13/2018 ?Cycle 3 Xeloda 09/03/2018 ?Cycle 4 Xeloda 09/24/2018 ?Restaging chest CT 10/12/2018- scattered pulmonary nodules/metastases mildly improved. ?Cycle 5 Xeloda 10/15/2018 ?Cycle 6 Xeloda 11/05/2018 ?Cycle 7 Xeloda 11/26/2018 ?Cycle 8 Xeloda 12/17/2018 ?Cycle 9 Xeloda 01/07/2019 ?CT chest 01/24/2019- stable pulmonary nodules.  No new pulmonary nodules.  Stable left hepatic lobe cysts and right hepatic lobe lesions.  No new hepatic lesions.  Stable right ninth and 10th rib lesions. ?Xeloda daily dosing schedule beginning 01/28/2019 due to hand-foot syndrome ?CT 05/27/2019- enlargement of lung metastases, similar right hepatic lobe lesion ?CT 08/05/2019-enlargement of bilateral lung nodules, no new lesions, stable right hepatic lesion ?Cycle 1 FOLFIRI 08/13/2019 ?Cycle 2 FOLFIRI 09/04/2019, Irinotecan dose reduced due to neutropenia ?Cycle 3 FOLFIRI 09/17/2019 ?Cycle 4 FOLFIRI 10/08/2019, 5-FU and irinotecan dose reduced ?CT chest 10/28/2019-bilateral pulmonary metastases with minimal improvement.  Stable ablation zone in the posterior right hepatic  lobe. ?CT chest 03/03/2020-enlargement of pulmonary metastases, decreased right hepatic ablation site ?CT chest 07/07/2020-enlargement of pulmonary metastases, stable ablation site involving right hepatic lobe ?Cycle 1 FOLFIRI/Avastin 07/27/2020 ?Cycle 2 FOLFIRI/Avastin 08/18/2020 ?Treatment held 09/08/2020 due to neutropenia ?Cycle 3 FOLFIRI/Avastin 09/17/2020 (irinotecan dose reduced due to neutropenia) ?Cycle 4 FOLFIRI/Avastin 10/06/2020 ?Chemotherapy discontinued per patient request ?CT chest 11/16/2020-decrease in size of dominant right perihilar nodule, other lung nodules are stable, stable right liver ablation site ?Right ovary cystadenofibroma, status post a bilateral oophorectomy 10/22/2013 ?Remote history of cervical cancer. ?Iron deficiency anemia-resolved. ?Right calf deep vein thrombosis 10/24/2013-she completed 3 months of xarelto. ?Indeterminate 12 mm hypermetabolic right pelvic density on the staging PET scan 10/04/2013-CT followup recommended per the GI tumor conference 11/13/2013. No longer visualized on a CT 07/14/2014. ?Soft tissue implant overlying the posterior right liver on the CT 29/51/8841-YSA hypermetabolic on the PET scan 63/08/6008. ?C. difficile colitis 03/05/2014. She completed a course of Flagyl, recurrent diarrhea beginning 03/21/2014, positive C. difficile toxin 03/26/2014-resumed Flagyl for planned 10 day course. ?Vancomycin beginning 04/08/2014. ?Taper complete 05/21/2014. ?  ?9.  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  Xeloda dose/schedule adjusted beginning 01/28/2019. ?  ?  ? ? ? ?Disposition: ?Ms. Sanks has a history of metastatic colon cancer.  The CEA is lower today and there is no clear evidence of disease progression.  I doubt the intermittent cough is related to colon cancer.  She will increase her calorie intake.  She indicated again today she does not wish to receive further treatment with chemotherapy or radiation. ? ?Ms. Derderian will return  for an office visit and Port-A-Cath flush in 6-7 weeks. ? ?Cynthia Coder, MD ? ?11/17/2021  ?9:39 AM ? ? ?

## 2021-11-17 NOTE — Patient Instructions (Signed)

## 2021-12-14 ENCOUNTER — Emergency Department (HOSPITAL_COMMUNITY): Payer: Medicare PPO

## 2021-12-14 ENCOUNTER — Inpatient Hospital Stay (HOSPITAL_BASED_OUTPATIENT_CLINIC_OR_DEPARTMENT_OTHER)
Admission: EM | Admit: 2021-12-14 | Discharge: 2021-12-16 | DRG: 065 | Disposition: A | Discharge status: Home / Self Care | Payer: Medicare PPO | Attending: Family Medicine | Admitting: Family Medicine

## 2021-12-14 ENCOUNTER — Emergency Department (HOSPITAL_BASED_OUTPATIENT_CLINIC_OR_DEPARTMENT_OTHER): Payer: Medicare PPO

## 2021-12-14 ENCOUNTER — Other Ambulatory Visit: Payer: Self-pay

## 2021-12-14 ENCOUNTER — Encounter (HOSPITAL_BASED_OUTPATIENT_CLINIC_OR_DEPARTMENT_OTHER): Payer: Self-pay

## 2021-12-14 ENCOUNTER — Inpatient Hospital Stay (HOSPITAL_COMMUNITY): Payer: Medicare PPO

## 2021-12-14 DIAGNOSIS — C787 Secondary malignant neoplasm of liver and intrahepatic bile duct: Secondary | ICD-10-CM | POA: Diagnosis present

## 2021-12-14 DIAGNOSIS — Z7902 Long term (current) use of antithrombotics/antiplatelets: Secondary | ICD-10-CM | POA: Diagnosis not present

## 2021-12-14 DIAGNOSIS — R200 Anesthesia of skin: Secondary | ICD-10-CM | POA: Diagnosis not present

## 2021-12-14 DIAGNOSIS — Z8261 Family history of arthritis: Secondary | ICD-10-CM

## 2021-12-14 DIAGNOSIS — Z86718 Personal history of other venous thrombosis and embolism: Secondary | ICD-10-CM | POA: Diagnosis not present

## 2021-12-14 DIAGNOSIS — Z8505 Personal history of malignant neoplasm of liver: Secondary | ICD-10-CM

## 2021-12-14 DIAGNOSIS — Z8249 Family history of ischemic heart disease and other diseases of the circulatory system: Secondary | ICD-10-CM | POA: Diagnosis not present

## 2021-12-14 DIAGNOSIS — R918 Other nonspecific abnormal finding of lung field: Secondary | ICD-10-CM | POA: Diagnosis not present

## 2021-12-14 DIAGNOSIS — I110 Hypertensive heart disease with heart failure: Secondary | ICD-10-CM | POA: Diagnosis not present

## 2021-12-14 DIAGNOSIS — Z803 Family history of malignant neoplasm of breast: Secondary | ICD-10-CM

## 2021-12-14 DIAGNOSIS — I63512 Cerebral infarction due to unspecified occlusion or stenosis of left middle cerebral artery: Principal | ICD-10-CM | POA: Diagnosis present

## 2021-12-14 DIAGNOSIS — G610 Guillain-Barre syndrome: Secondary | ICD-10-CM | POA: Diagnosis present

## 2021-12-14 DIAGNOSIS — Z9049 Acquired absence of other specified parts of digestive tract: Secondary | ICD-10-CM

## 2021-12-14 DIAGNOSIS — Z90710 Acquired absence of both cervix and uterus: Secondary | ICD-10-CM

## 2021-12-14 DIAGNOSIS — Z833 Family history of diabetes mellitus: Secondary | ICD-10-CM | POA: Diagnosis not present

## 2021-12-14 DIAGNOSIS — Z806 Family history of leukemia: Secondary | ICD-10-CM | POA: Diagnosis not present

## 2021-12-14 DIAGNOSIS — C189 Malignant neoplasm of colon, unspecified: Secondary | ICD-10-CM | POA: Diagnosis present

## 2021-12-14 DIAGNOSIS — D649 Anemia, unspecified: Secondary | ICD-10-CM | POA: Diagnosis present

## 2021-12-14 DIAGNOSIS — Z66 Do not resuscitate: Secondary | ICD-10-CM | POA: Diagnosis not present

## 2021-12-14 DIAGNOSIS — R29898 Other symptoms and signs involving the musculoskeletal system: Secondary | ICD-10-CM | POA: Diagnosis not present

## 2021-12-14 DIAGNOSIS — E785 Hyperlipidemia, unspecified: Secondary | ICD-10-CM | POA: Diagnosis present

## 2021-12-14 DIAGNOSIS — Z933 Colostomy status: Secondary | ICD-10-CM | POA: Diagnosis not present

## 2021-12-14 DIAGNOSIS — I6389 Other cerebral infarction: Secondary | ICD-10-CM | POA: Diagnosis not present

## 2021-12-14 DIAGNOSIS — Z885 Allergy status to narcotic agent status: Secondary | ICD-10-CM

## 2021-12-14 DIAGNOSIS — R269 Unspecified abnormalities of gait and mobility: Secondary | ICD-10-CM

## 2021-12-14 DIAGNOSIS — C7800 Secondary malignant neoplasm of unspecified lung: Secondary | ICD-10-CM | POA: Diagnosis present

## 2021-12-14 DIAGNOSIS — R911 Solitary pulmonary nodule: Secondary | ICD-10-CM | POA: Diagnosis not present

## 2021-12-14 DIAGNOSIS — I10 Essential (primary) hypertension: Secondary | ICD-10-CM | POA: Diagnosis not present

## 2021-12-14 DIAGNOSIS — Z8541 Personal history of malignant neoplasm of cervix uteri: Secondary | ICD-10-CM

## 2021-12-14 DIAGNOSIS — I63312 Cerebral infarction due to thrombosis of left middle cerebral artery: Secondary | ICD-10-CM | POA: Diagnosis not present

## 2021-12-14 DIAGNOSIS — Z79899 Other long term (current) drug therapy: Secondary | ICD-10-CM

## 2021-12-14 DIAGNOSIS — I5032 Chronic diastolic (congestive) heart failure: Secondary | ICD-10-CM | POA: Diagnosis present

## 2021-12-14 DIAGNOSIS — Z8673 Personal history of transient ischemic attack (TIA), and cerebral infarction without residual deficits: Secondary | ICD-10-CM | POA: Diagnosis not present

## 2021-12-14 DIAGNOSIS — R531 Weakness: Secondary | ICD-10-CM | POA: Diagnosis not present

## 2021-12-14 DIAGNOSIS — I6502 Occlusion and stenosis of left vertebral artery: Secondary | ICD-10-CM | POA: Diagnosis not present

## 2021-12-14 DIAGNOSIS — I639 Cerebral infarction, unspecified: Secondary | ICD-10-CM | POA: Diagnosis present

## 2021-12-14 LAB — CBC WITH DIFFERENTIAL/PLATELET
Abs Immature Granulocytes: 0.02 10*3/uL (ref 0.00–0.07)
Basophils Absolute: 0 10*3/uL (ref 0.0–0.1)
Basophils Relative: 1 %
Eosinophils Absolute: 0.2 10*3/uL (ref 0.0–0.5)
Eosinophils Relative: 3 %
HCT: 37.4 % (ref 36.0–46.0)
Hemoglobin: 11.6 g/dL — ABNORMAL LOW (ref 12.0–15.0)
Immature Granulocytes: 0 %
Lymphocytes Relative: 20 %
Lymphs Abs: 1.1 10*3/uL (ref 0.7–4.0)
MCH: 27.6 pg (ref 26.0–34.0)
MCHC: 31 g/dL (ref 30.0–36.0)
MCV: 89 fL (ref 80.0–100.0)
Monocytes Absolute: 0.4 10*3/uL (ref 0.1–1.0)
Monocytes Relative: 7 %
Neutro Abs: 4 10*3/uL (ref 1.7–7.7)
Neutrophils Relative %: 69 %
Platelets: 257 10*3/uL (ref 150–400)
RBC: 4.2 MIL/uL (ref 3.87–5.11)
RDW: 14.2 % (ref 11.5–15.5)
WBC: 5.8 10*3/uL (ref 4.0–10.5)
nRBC: 0 % (ref 0.0–0.2)

## 2021-12-14 LAB — TSH: TSH: 2.31 u[IU]/mL (ref 0.350–4.500)

## 2021-12-14 LAB — COMPREHENSIVE METABOLIC PANEL
ALT: 9 U/L (ref 0–44)
AST: 20 U/L (ref 15–41)
Albumin: 3.8 g/dL (ref 3.5–5.0)
Alkaline Phosphatase: 49 U/L (ref 38–126)
Anion gap: 9 (ref 5–15)
BUN: 17 mg/dL (ref 8–23)
CO2: 29 mmol/L (ref 22–32)
Calcium: 9.6 mg/dL (ref 8.9–10.3)
Chloride: 104 mmol/L (ref 98–111)
Creatinine, Ser: 0.94 mg/dL (ref 0.44–1.00)
GFR, Estimated: >60 mL/min (ref >60)
Glucose, Bld: 91 mg/dL (ref 70–99)
Potassium: 4.5 mmol/L (ref 3.5–5.1)
Sodium: 142 mmol/L (ref 135–145)
Total Bilirubin: 0.4 mg/dL (ref 0.3–1.2)
Total Protein: 7.2 g/dL (ref 6.5–8.1)

## 2021-12-14 LAB — PROTIME-INR
INR: 1.1 (ref 0.8–1.2)
Prothrombin Time: 13.9 seconds (ref 11.4–15.2)

## 2021-12-14 LAB — MAGNESIUM: Magnesium: 1.8 mg/dL (ref 1.7–2.4)

## 2021-12-14 LAB — TROPONIN I (HIGH SENSITIVITY)
Troponin I (High Sensitivity): 4 ng/L (ref <18)
Troponin I (High Sensitivity): 5 ng/L (ref <18)

## 2021-12-14 MED ORDER — STROKE: EARLY STAGES OF RECOVERY BOOK
Freq: Once | Status: AC
  Filled 2021-12-14: qty 1

## 2021-12-14 MED ORDER — ACETAMINOPHEN 650 MG RE SUPP: 650.0000 mg | RECTAL | Status: DC | PRN

## 2021-12-14 MED ORDER — SODIUM CHLORIDE 0.9 % IV SOLN: INTRAVENOUS | Status: DC

## 2021-12-14 MED ORDER — ASPIRIN 300 MG RE SUPP: 300.0000 mg | Freq: Every day | RECTAL | Status: DC

## 2021-12-14 MED ORDER — ACETAMINOPHEN 160 MG/5ML PO SOLN: 650.0000 mg | ORAL | Status: DC | PRN

## 2021-12-14 MED ORDER — ASPIRIN 325 MG PO TABS: 325.0000 mg | ORAL_TABLET | Freq: Every day | ORAL | Status: DC

## 2021-12-14 MED ORDER — ACETAMINOPHEN 325 MG PO TABS: 650.0000 mg | ORAL_TABLET | ORAL | Status: DC | PRN

## 2021-12-14 MED ORDER — GABAPENTIN 300 MG PO CAPS
400.0000 mg | ORAL_CAPSULE | Freq: Once | ORAL | Status: AC
  Administered 2021-12-14: 400 mg via ORAL
  Filled 2021-12-14: qty 1

## 2021-12-14 MED ORDER — SODIUM CHLORIDE 0.9 % IV BOLUS
500.0000 mL | Freq: Once | INTRAVENOUS | Status: AC
  Administered 2021-12-14: 500 mL via INTRAVENOUS

## 2021-12-14 NOTE — ED Notes (Signed)
[facility] report given 

## 2021-12-14 NOTE — ED Provider Notes (Signed)
[facility] for MRI to rule out stroke.  She has been having some issues with her right foot recently.  History of Guillain-Barr?, stroke in the past, has some chronic numbness on her left side and issues with her feet bilaterally at times.  However she has been having maybe some foot drop or some foot weakness mostly in the right foot and ankle.  She denies any pain or trauma.  She is having to use her walker to ambulate now more recently which is not typical for her.  She denies any recent illness.  She takes gabapentin for the neuropathic issues she is has in her feet.  Overall on my exam she has normal strength and sensation.  There is no evidence of foot drop.  But will get MRI to further rule out stroke.  We will give her home dose of gabapentin. ? ?MRI report shows small acute infarcts of the left caudate tail and anterior left centrum semiovale.  I have contacted Dr. Cheral Marker with neurology.  Will admit to medicine for further stroke care. ? ?This chart was dictated using voice recognition software.  Despite best efforts to proofread,  errors can occur which can change the documentation meaning.  ?  Lennice Sites, DO ?12/14/21 [id number] ? ?

## 2021-12-14 NOTE — ED Notes (Signed)
RN Charge report given. ?

## 2021-12-14 NOTE — Progress Notes (Signed)
ON CALL PHONE CONSULT ? ?Call from Dr. Long'[email or web address]  ER ? ? ?Discussion ?Patient with a past medical history of strokes and Guillain-Barr? with right foot weakness and dragging since Sunday with normal sensation, normal reflexes.  Independent ambulation at baseline and now requiring walker.  Recommended MRI of the brain without contrast at [facility].  If positive, or if any other concern, please call inpatient neurology at [facility]. ? ?-- ?[facility], MD ?Neurologist ?[facility] ?Pager: [phone] ? ?

## 2021-12-14 NOTE — Subjective & Objective (Addendum)
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 ?

## 2021-12-14 NOTE — ED Triage Notes (Signed)
Patient here POV from Home. ? ?Endorses Numbness to Right Leg (from Right Foot to Right Knee) that began Sunday at approximately 0100 when the Patient stood up. ? ?Endorses History of GBS and Previous Stroke.  ? ?NAD Noted during Triage. A&Ox4. GCS 15. Ambulatory with walker. ?

## 2021-12-14 NOTE — ED Notes (Signed)
Patient transported to MRI 

## 2021-12-14 NOTE — H&P (Signed)
? ? ?Cynthia [name] IHK:[id number] DOB: 1935-10-31 DOA: 12/14/2021 ? ? ?  ?PCP: Jonathon Jordan, MD   ?Outpatient Specialists:  ?  ? Oncology  Dr. Benay Spice ?  ? ?Patient arrived to ER on 12/14/21 at 1152 ?Referred by Attending Toy Baker, MD ? ? ?Patient coming from:   ? home Lives  With family ?  ? ?Chief Complaint:   ?Chief Complaint  ?Patient presents with  ? MRI/[facility]  ? ? ?HPI: ?Cynthia [name] is a 86 y.o. female with medical history significant of   of strokes and Guillain-Barr?, hx of colon cancer sp colectomy with hepatic metastasis, pulmonary nodules, c.diff colitis ?   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 ?   ?Regarding pertinent Chronic problems:   ?   ? chronic CHF diastolic  - last [id number] ?(grade 1 diastolic dysfunction). ?  Hx of CVA -  with/out residual deficits not on any meds ?  Chronic anemia - baseline hg Hemoglobin & Hematocrit  ?Recent Labs  ?  12/14/21 ?1310  ?HGB 11.6*  ? ?Hx oc colon cancer followed by Dr. Benay Spice ? ?While in ER: ?  ?Initially at [place] had neg Neurology was consulted and rec transfer to [facility] for MRI  ? ?MRI found Small acute infarcts of the left caudate tail and anterior left ?centrum semiovale. No hemorrhage or mass effect. ? ? ? ?Ordered ? ?CT HEAD   NON acute ? ?CXR - Unchanged bilateral pulmonary masses and airspace disease ?  ?MRI Small acute infarcts of the left caudate tail and anterior left ?centrum semiovale. No hemorrhage or mass effect. ? ?Following Medications were ordered in ER: ?Medications  ?sodium chloride 0.9 % bolus 500 mL (0 mLs Intravenous Stopped 12/14/21 1537)  ?gabapentin (NEURONTIN) capsule 400 mg (400 mg Oral Given  12/14/21 2026)  ?  ?_______________________________________________________ ?ER Provider Called:  NEurolgy     Dr.Lindzen ?They Recommend admit to medicine   ? SEEN in ER ?  ?ED Triage Vitals  ?Enc Vitals Group  ?   BP 12/14/21 1202 (!) 170/90  ?   Pulse Rate 12/14/21 1202 80  ?   Resp 12/14/21 1202 14  ?   Temp 12/14/21 1202 98 ?F (36.7 ?C)  ?   Temp Source 12/14/21 1628 Oral  ?   SpO2 12/14/21 1202 94 %  ?   Weight 12/14/21 1205 120 lb 13 oz (54.8 kg)  ?   Height 12/14/21 1205 '5\' 1"'$  (1.549 m)  ?   Head Circumference --   ?   Peak Flow --   ?   Pain Score 12/14/21 1205 0  ?   Pain Loc --   ?   Pain Edu? --   ?   Excl. in [place]? --   ?JJOA(41)@    ? _________________________________________ ?Significant initial  Findings: ?Abnormal Labs Reviewed  ?CBC WITH DIFFERENTIAL/PLATELET - Abnormal; Notable for the following components:  ?    Result Value  ? Hemoglobin 11.6 (*)   ? All other components within normal limits  ? ?  ?_________________________ ?Troponin 4 - 5 ?ECG: Ordered ?Personally reviewed by me showing: ?HR : 78 ?Rhythm:Normal sinus rhythm ?  Possible Left atrial enlargement ?Anterior infarct , age undetermined ?Abnormal ECG ?QTC 438 ? ?  ? ?The recent clinical data is shown below. ?Vitals:  ? 12/14/21 1500 12/14/21 1530 12/14/21 1628 12/14/21 2222  ?BP: (!) 149/79 (!) 160/84 (!) 187/95 (!) 169/68  ?Pulse: 67 66 75 76  ?Resp: '15 15 14 16  '$ ?Temp:   98.6 ?F (37 ?C)   ?TempSrc:   Oral   ?SpO2: 100% 98% 98% 97%  ?Weight:      ?Height:      ?  ?  ?WBC ? ?   ?Component Value Date/Time  ? WBC 5.8 12/14/2021 1310  ? LYMPHSABS 1.1 12/14/2021 1310  ? LYMPHSABS 1.5 07/14/2014 1149  ? MONOABS 0.4 12/14/2021 1310  ? MONOABS 0.4 07/14/2014 1149  ? EOSABS 0.2 12/14/2021 1310  ? EOSABS 0.1 07/14/2014 1149  ? BASOSABS 0.0 12/14/2021 1310  ? BASOSABS 0.0 07/14/2014 1149  ? ?   ? ? UA  not ordered ?   ? ?Results for orders placed or performed in visit on 11/07/17  ?Urine Culture     Status: None  ? Collection Time: 11/07/17 11:15 AM   ? Specimen: Urine, Random  ?Result Value Ref Range Status  ? Specimen Description   Final  ?  URINE, RANDOM ?Performed at [facility], [place] [address], [place], [place] [address] ?  ? Special Requests   Final  ?  NONE ?Performed at [facility], [place] [address] [place], [place] [address] ?  ? Culture   Final  ?  NO GROWTH ?Performed at [facility], [place] [address], [place], [place] [address] ?  ? Report Status 11/08/2017 FINAL  Final  ? ? ? ?_______________________________________________ ?Hospitalist was called for admission for  ? ?Acute CVA   ?   ? ?The following Work up has been ordered so far: ? ?Orders Placed This Encounter  ?Procedures  ? CT Head Wo Contrast  ? MR BRAIN WO CONTRAST  ? Comprehensive metabolic panel  ? CBC with Differential  ? Protime-INR  ? Magnesium  ? TSH  ? Cardiac monitoring  ? Cardiac monitoring  ? Consult to neurology  ? Consult to neurology  ? Consult to hospitalist  ? EKG 12-Lead  ? ED EKG  ? Saline lock IV  ? Admit to Inpatient (patient's expected length of stay will be greater than 2 midnights or inpatient only procedure)  ?  ? ?OTHER Significant initial  Findings: ? ?labs showing: ? ?  ?Recent Labs  ?Lab 12/14/21 ?1310  ?NA 142  ?K 4.5  ?CO2 29  ?GLUCOSE 91  ?BUN 17  ?CREATININE 0.94  ?CALCIUM 9.6  ?MG 1.8  ? ? ?Cr   stable,    ?Lab Results  ?Component Value Date  ? CREATININE 0.94 12/14/2021  ? CREATININE 0.82 10/27/2020  ? CREATININE 0.89 10/06/2020  ? ? ?Recent Labs  ?Lab 12/14/21 ?1310  ?AST 20  ?ALT 9  ?ALKPHOS 49  ?BILITOT 0.4  ?PROT 7.2  ?ALBUMIN 3.8  ? ?Lab Results  ?Component Value Date  ? CALCIUM 9.6 12/14/2021  ? PHOS 3.5 10/19/2015  ? ?    ?   ?Plt: ?Lab Results  ?Component Value Date  ? PLT 257 12/14/2021  ? ?  ?Recent Labs  ?Lab 12/14/21 ?1310  ?WBC 5.8  ?NEUTROABS 4.0  ?HGB 11.6*  ?HCT 37.4  ?MCV 89.0  ?PLT 257  ? ? ?HG/HCT   stable,   ?   ?  Component Value Date/Time  ? HGB 11.6 (L) 12/14/2021 1310  ? HGB 11.0  (L) 10/27/2020 1127  ? HGB 13.3 07/14/2014 1149  ? HCT 37.4 12/14/2021 1310  ? HCT 36.9 01/11/2015 1920  ? HCT 40.5 07/14/2014 1149  ? MCV 89.0 12/14/2021 1310  ? MCV 95.5 07/14/2014 1149  ? ? ?  ? ?Cardiac Panel (last 3 results) ?No results for input(s): CKTOTAL, CKMB, TROPONINI, RELINDX in the last 72 hours. ? .car ?BNP (last 3 results) ?No results for input(s): BNP in the last 8760 hours. ?  ?  ?    ?Cultures: ?   ?Component Value Date/Time  ? SDES  11/07/2017 1115  ?  URINE, RANDOM ?Performed at [facility], [place] [address], [place], [place] [address] ?  ? SPECREQUEST  11/07/2017 1115  ?  NONE ?Performed at [facility], [place] [address] [place], [place] [address] ?  ? CULT  11/07/2017 1115  ?  NO GROWTH ?Performed at [facility], [place] [address], [place], [place] [address] ?  ? REPTSTATUS 11/08/2017 FINAL 11/07/2017 1115  ? ?  ?Radiological Exams on Admission: ?CT Head Wo Contrast ? ?Result Date: 12/14/2021 ?CLINICAL DATA:  Right lower leg numbness since Sunday. History of cancer. EXAM: CT HEAD WITHOUT CONTRAST TECHNIQUE: Contiguous axial images were obtained from the base of the skull through the vertex without intravenous contrast. RADIATION DOSE REDUCTION: This exam was performed according to the departmental dose-optimization program which includes automated exposure control, adjustment of the mA and/or kV according to patient size and/or use of iterative reconstruction technique. COMPARISON:  CT head dated October 24, 2015. FINDINGS: Brain: No evidence of acute infarction, hemorrhage, hydrocephalus, extra-axial collection or mass lesion/mass effect. Stable mild atrophy and chronic microvascular ischemic changes. Vascular: Atherosclerotic vascular calcification of the carotid siphons. No hyperdense vessel. Skull: Normal. Negative for fracture or focal lesion. Sinuses/Orbits: No acute finding. Other: None. IMPRESSION: 1. No acute intracranial abnormality.  Electronically Signed   By: Titus Dubin M.D.   On: 12/14/2021 12:54  ? ?MR BRAIN WO CONTRAST ? ?Result Date: 12/14/2021 ?CLINICAL DATA:  Transient ischemic attack EXAM: MRI HEAD WITHOUT CONTRAST TECHNIQU

## 2021-12-14 NOTE — ED Provider Notes (Signed)
? ?Emergency Department Provider Note ? ? ?I have reviewed the triage vital signs and the nursing notes. ? ? ?HISTORY ? ?Chief Complaint ?MRI/[facility] ? ? ?HPI ?Cynthia [name] is a 86 y.o. female with past medical history reviewed below including prior stroke and GBS presents to the emergency department with numb feeling and some weakness/gait instability in the right foot.  Patient states symptoms are mainly isolated to this location.  She always has tingly/pins-and-needles feeling to her bilateral feet as a result of her Guillain-Barr?.  She noticed on Sunday that the right foot felt different and weak.  She feels like it is dragging when she walks and is started to use a walker again which she has not required in several years.  No symptoms extending into the upper leg, arms, face.  No vision or speech difficulty.  Daughter is at bedside and has noted these symptoms as well. ? ? ?Past Medical History:  ?Diagnosis Date  ? Anemia   ? Blood transfusion without reported diagnosis jan 2015  ? 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 Cancer [facility])   ? liver  ? Cervical cancer ([place]) 1972  ? Colon cancer ([place]) JAN 2015  ? Colon cancer ([place])   ? Complication of anesthesia   ? slow to awaken in past, DONE WELL RECENTLY  ? DVT (deep venous thrombosis) ([place]) 10/24/13  ? Right leg  ? Guillain-Barre ([place]) 09/2015  ? Heart murmur   ? Hypertension   ? no bp meds   ? Liver cyst   ? Stroke [facility]) 12/2014  ? NUMBNESS ON LEFT SIDE  ? Tinnitus of both ears ALL THE TIME  ? ? ?Review of Systems ? ?Constitutional: No fever/chills ?Eyes: No visual changes. ?ENT: No sore throat. ?Cardiovascular: Denies chest pain. ?Respiratory: Denies shortness of breath. ?Gastrointestinal: No abdominal pain.  No nausea, no vomiting.  No diarrhea.  No constipation. ?Genitourinary: Negative for dysuria. ?Musculoskeletal: Negative for back pain. ?Skin: Negative for rash. ?Neurological: Negative for headaches. Positive  weakness/numbness isolated to the right foot.  ? ? ?____________________________________________ ? ? ?PHYSICAL EXAM: ? ?VITAL SIGNS: ?ED Triage Vitals  ?Enc Vitals Group  ?   BP 12/14/21 1202 (!) 170/90  ?   Pulse Rate 12/14/21 1202 80  ?   Resp 12/14/21 1202 14  ?   Temp 12/14/21 1202 98 ?F (36.7 ?C)  ?   Temp src --   ?   SpO2 12/14/21 1202 94 %  ?   Weight 12/14/21 1205 120 lb 13 oz (54.8 kg)  ?   Height 12/14/21 1205 '5\' 1"'$  (1.549 m)  ? ?Constitutional: Alert and oriented. Well appearing and in no acute distress. ?Eyes: Conjunctivae are normal.  ?Head: Atraumatic. ?Nose: No congestion/rhinnorhea. ?Mouth/Throat: Mucous membranes are moist.  ?Neck: No stridor.   ?Cardiovascular: Normal rate, regular rhythm. Good peripheral circulation. Grossly normal heart sounds.   ?Respiratory: Normal respiratory effort.  No retractions. Lungs CTAB. ?Gastrointestinal: Soft and nontender. No distention.  ?Musculoskeletal: No lower extremity tenderness nor edema. No gross deformities of extremities. ?Neurologic:  Normal speech and language. No gross focal neurologic deficits are appreciated.  No facial asymmetry.  5/5 strength in the bilateral upper extremities.  Normal strength with hip flexion/extension.  Perhaps mild decreased strength with plantar and dorsiflexion of the right foot but sensation is normal throughout.  ?Skin:  Skin is warm, dry and intact. No rash noted. ? ? ?____________________________________________ ?  ?LABS ?(all labs ordered are listed, but  only abnormal results are displayed) ? ?Labs Reviewed  ?CBC WITH DIFFERENTIAL/PLATELET - Abnormal; Notable for the following components:  ?    Result Value  ? Hemoglobin 11.6 (*)   ? All other components within normal limits  ?LIPID PANEL - Abnormal; Notable for the following components:  ? LDL Cholesterol 141 (*)   ? All other components within normal limits  ?HEMOGLOBIN A1C - Abnormal; Notable for the following components:  ? Hgb A1c MFr Bld 5.7 (*)   ? All other  components within normal limits  ?COMPREHENSIVE METABOLIC PANEL  ?PROTIME-INR  ?MAGNESIUM  ?TSH  ?TROPONIN I (HIGH SENSITIVITY)  ?TROPONIN I (HIGH SENSITIVITY)  ? ?____________________________________________ ? ?EKG ? ? EKG Interpretation ? ?Date/Time:  Wednesday Dec 15 2021 02:14:18 EDT ?Ventricular Rate:  70 ?PR Interval:  182 ?QRS Duration: 88 ?QT Interval:  390 ?QTC Calculation: 421 ?R Axis:   19 ?Text Interpretation: Sinus rhythm Probable left atrial enlargement Probable inferior infarct, old No significant change since last tracing Confirmed by Gareth Morgan [phone]) on 12/16/2021 9:58:51 AM ?  ? ?  ? ? ?____________________________________________ ? ? ?PROCEDURES ? ?Procedure(s) performed:  ? ?Procedures ? ?None  ?____________________________________________ ? ? ?INITIAL IMPRESSION / ASSESSMENT AND PLAN / ED COURSE ? ?Pertinent labs & imaging results that were available during my care of the patient were reviewed by me and considered in my medical decision making (see chart for details). ?  ?This patient is Presenting for Evaluation of numbness/weakness, which does require a range of treatment options, and is a complaint that involves a high risk of morbidity and mortality. ? ?The Differential Diagnoses include CVA, GBS, electrolyte disturbance, peripheral neuropathy.  ? ?Critical Interventions-  ?  ?Medications  ?perflutren lipid microspheres (DEFINITY) IV suspension (2 mLs Intravenous Given 12/15/21 0953)  ?sodium chloride 0.9 % bolus 500 mL (0 mLs Intravenous Stopped 12/14/21 1537)  ?gabapentin (NEURONTIN) capsule 400 mg (400 mg Oral Given 12/14/21 2026)  ? stroke: early stages of recovery book ( Does not apply Given 12/15/21 1451)  ?aspirin tablet 325 mg (325 mg Oral Given 12/15/21 0207)  ? ? ?Reassessment after intervention:  Symptoms persist.  ? ? ?I did obtain Additional Historical Information from daughter at bedside. ? ?I decided to review pertinent External Data, and in summary patient with prior CVA workup  in 2017. ?  ?Clinical Laboratory Tests Ordered, included no acute kidney injury or electrolyte disturbance.  Magnesium is normal.  No leukocytosis or anemia. ? ?Radiologic Tests Ordered, included CT head. I independently interpreted the images and agree with radiology interpretation.  ? ?Cardiac Monitor Tracing which shows NSR. ? ? ?Social Determinants of Health Risk patient is a non-smoker.  ? ?Consult complete with Neurology, Dr. Rory Percy. Plan for ED-ED transfer for MRI brain w/o contrast to evaluate for CVA.  ? ?Medical Decision Making: Summary:  ?Patient presents to the emergency department for evaluation of numbness/weakness to the right foot.  Perhaps some mild weakness on exam but symptoms very isolated to the right foot.  CT imaging of the hand along with labs are reassuring but patient will need MRI.   ?Discussed ED-ED transfer with Dr. Regenia Skeeter who accepts.  ? ?Reevaluation with update and discussion with patient and family. They are in agreement with transfer to [facility] for MRI.  ? ?Disposition: transfer ED-ED to [facility] ? ?____________________________________________ ? ?FINAL CLINICAL IMPRESSION(S) / ED DIAGNOSES ? ?Final diagnoses:  ?Weakness of right foot  ?Acute CVA (cerebrovascular accident) ([place])  ? ? ? ?NEW OUTPATIENT MEDICATIONS  STARTED DURING THIS VISIT: ? ?Discharge Medication List as of 12/16/2021  2:59 PM  ?  ? ?START taking these medications  ? Details  ?aspirin EC 81 MG EC tablet Take 1 tablet (81 mg total) by mouth daily. Swallow whole., Starting Fri 12/17/2021, Until Mon 12/12/2022, Normal  ?  ?atorvastatin (LIPITOR) 40 MG tablet Take 1 tablet (40 mg total) by mouth daily., Starting Fri 12/17/2021, Until Sun 01/16/2022, Normal  ?  ?clopidogrel (PLAVIX) 75 MG tablet Take 1 tablet (75 mg total) by mouth daily for 21 days., Starting Fri 12/17/2021, Until Fri 01/07/2022, Normal  ?  ?  ? ? ?Note:  This document was prepared using Dragon voice recognition software and may include unintentional dictation  errors. ? ?Nanda Quinton, MD, FACEP ?Emergency Medicine ? ?  ?Margette Fast, MD ?12/17/21 [phone] ? ?

## 2021-12-14 NOTE — Consult Note (Addendum)
?                    NEURO HOSPITALIST CONSULT NOTE  ? ?Requestig physician: Dr. Ronnald Nian ? ?Reason for Consult: Acute left centrum semiovale ischemic infarction ? ?History obtained from:  Patient and Chart    ? ?HPI:                                                                                                                                         ? Cynthia [name] is an 86 y.o. female with a history of liver cancer, cervical cancer, colon cancer s/p resection and chemotherapy, anemia, DVT RLE, GBS, HTN and prior stroke several years ago with left sided numbness who presented to the ED with new onset of RLE weakness. MRI brain revealed a left deep white matter acute ischemic infarction.  ? ?She was on ASA a few years ago but states that she was told to "switch to Tylenol" when she was undergoing treatment for her colon cancer.  ? ?Past Medical History:  ?Diagnosis Date  ? Anemia   ? Blood transfusion without reported diagnosis jan 2015  ? 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 Cancer [facility])   ? liver  ? Cervical cancer ([place]) 1972  ? Colon cancer ([place]) JAN 2015  ? Colon cancer ([place])   ? Complication of anesthesia   ? slow to awaken in past, DONE WELL RECENTLY  ? DVT (deep venous thrombosis) ([place]) 10/24/13  ? Right leg  ? Guillain-Barre ([place]) 09/2015  ? Heart murmur   ? Hypertension   ? no bp meds   ? Liver cyst   ? Stroke [facility]) 12/2014  ? NUMBNESS ON LEFT SIDE  ? Tinnitus of both ears ALL THE TIME  ? ? ?Past Surgical History:  ?Procedure Laterality Date  ? ABDOMINAL HYSTERECTOMY    ? vaginal- partial  ? BACK SURGERY  1981  ? lower lumbar  ? ESOPHAGOGASTRODUODENOSCOPY N/A 10/14/2015  ? Procedure: ESOPHAGOGASTRODUODENOSCOPY (EGD);  Surgeon: Clarene Essex, MD;  Location: [facility] ENDOSCOPY;  Service: Endoscopy;  Laterality: N/A;  Bedside in ICU  ? IR CV LINE INJECTION  07/20/2020  ? IR GENERIC HISTORICAL  09/07/2016  ? IR RADIOLOGIST EVAL & MGMT 09/07/2016 Aletta Edouard, MD [facility] INTERV RAD  ? IR  IMAGING GUIDED PORT INSERTION  07/29/2019  ? LAPAROSCOPIC RIGHT COLECTOMY Right 10/22/2013  ? Procedure: LAPAROSCOPIC RIGHT COLECTOMY ;  Surgeon: [name], MD;  Location: [facility] ORS;  Service: General;  Laterality: Right;  ? SALPINGOOPHORECTOMY  10/22/2013  ? Procedure: BILATERALSALPINGO OOPHORECTOMY;  Surgeon: [name]. [name], MD;  Location: [facility] ORS;  Service: Gynecology;;  ? [place]  ? ? ?Family History  ?Problem Relation Age of Onset  ? Diabetes Sister   ? Cancer Sister   ?     leukemia  ? Diabetes Brother   ?  Heart failure Brother   ? Hypertension Brother   ? Rheum arthritis Mother   ? Heart failure Father   ? Breast cancer Maternal Aunt   ? Cancer Maternal Grandmother   ?     GIST  ? Heart failure Maternal Grandfather   ? Aneurysm Paternal Grandmother   ?          ? ?Social History:  reports that she has never smoked. She has never used smokeless tobacco. She reports that she does not drink alcohol and does not use drugs. ? ?Allergies  ?Allergen Reactions  ? Morphine And Related Other (See Comments)  ?  Patient has hallucinations  ? ? ?MEDICATIONS:                                                                                                                     ?Current Facility-Administered Medications on File Prior to Encounter  ?Medication Dose Route Frequency Provider Last Rate Last Admin  ? heparin lock flush 100 unit/mL  500 Units Intracatheter Once PRN Ladell Pier, MD      ? sodium chloride flush (NS) 0.9 % injection 10 mL  10 mL Intracatheter PRN Ladell Pier, MD      ? sodium chloride flush (NS) 0.9 % injection 10 mL  10 mL Intracatheter PRN Ladell Pier, MD   10 mL at 05/24/21 0916  ? ?Current Outpatient Medications on File Prior to Encounter  ?Medication Sig Dispense Refill  ? Cholecalciferol (VITAMIN D3) 25 MCG (1000 UT) CAPS 1 capsule    ? gabapentin (NEURONTIN) 400 MG capsule TAKE 1 CAPSULE BY MOUTH THREE TIMES DAILY. MAY TAKE ADDITIONAL DOSE AT NIGHT AS NEEDED 100  capsule 5  ? lidocaine-prilocaine (EMLA) cream Apply 1 application topically as directed. Apply to port site 1 hour prior to stick and cover with plastic wrap 30 g 2  ? metoprolol succinate (TOPROL-XL) 50 MG 24 hr tablet Take 100 mg by mouth daily. Take with or immediately following a meal.    ? vitamin B-12 (CYANOCOBALAMIN) 1000 MCG tablet 1 tablet    ? acetaminophen (TYLENOL) 500 MG tablet Take 1,000 mg by mouth every 6 (six) hours as needed for mild pain.     ? loperamide (IMODIUM) 2 MG capsule Take 2-4 mg by mouth as needed for diarrhea or loose stools.    ? prochlorperazine (COMPAZINE) 5 MG tablet Take 1 tablet (5 mg total) by mouth every 6 (six) hours as needed for nausea or vomiting. (Patient not taking: Reported on 12/14/2021) 30 tablet 0  ? ? ?Scheduled: ?  stroke: early stages of recovery book   Does not apply Once  ? aspirin EC  81 mg Oral Daily  ? atorvastatin  40 mg Oral Daily  ? ?Continuous: ? sodium chloride    ? ? ? ?ROS:                                                                                                                                       ?  As per HPI. Does not endorse any other new symptoms.  ? ? ?Blood pressure (!) 187/95, pulse 75, temperature 98.6 ?F (37 ?C), temperature source Oral, resp. rate 14, height '5\' 1"'$  (1.549 m), weight 54.8 kg, SpO2 98 %. ? ? ?General Examination:                                                                                                      ? ?Physical Exam  ?HEENT-  [place]/AT   ?Lungs- Respirations unlabored ?Extremities- No edema ? ?Neurological Examination ?Mental Status: Alert, fully oriented, thought content appropriate.  Speech fluent without evidence of aphasia or dysarthria.  Able to follow all commands without difficulty. ?Cranial Nerves: ?II: Temporal visual fields intact with no extinction to DSS. PERRL.  ?III,IV, VI: No ptosis. EOMI. No nystagmus.  ?V,VII: Smile symmetric, facial temp sensation normal bilaterally ?VIII: Hearing intact to  voice ?IX,X: No hypophonia or hoarseness ?XI: Slight lag on the left with shoulder shrug ?XII: Midline tongue extension ?Motor: ?BUE 5/5 except for 4+/5 left biceps and triceps ?LLE 5/5 ?RLE with 4+/5 ADF and APF, otherwise 5/5 ?Sensory: Temp sensation decreased to RUE, otherwise temp sensation intact x 4. FT intact x 4 with no extinction to DSS.  ?Deep Tendon Reflexes: 2+ and symmetric throughout ?Cerebellar: No ataxia with FNF and H-S bilaterally  ?Gait: Deferred ? ?NIHSS: 1 ?  ?Lab Results: ?Basic Metabolic Panel: ?Recent Labs  ?Lab 12/14/21 ?1310  ?NA 142  ?K 4.5  ?CL 104  ?CO2 29  ?GLUCOSE 91  ?BUN 17  ?CREATININE 0.94  ?CALCIUM 9.6  ?MG 1.8  ? ? ?CBC: ?Recent Labs  ?Lab 12/14/21 ?1310  ?WBC 5.8  ?NEUTROABS 4.0  ?HGB 11.6*  ?HCT 37.4  ?MCV 89.0  ?PLT 257  ? ? ?Cardiac Enzymes: ?No results for input(s): CKTOTAL, CKMB, CKMBINDEX, TROPONINI in the last 168 hours. ? ?Lipid Panel: ?No results for input(s): CHOL, TRIG, HDL, CHOLHDL, VLDL, LDLCALC in the last 168 hours. ? ?Imaging: ?CT Head Wo Contrast ? ?Result Date: 12/14/2021 ?CLINICAL DATA:  Right lower leg numbness since Sunday. History of cancer. EXAM: CT HEAD WITHOUT CONTRAST TECHNIQUE: Contiguous axial images were obtained from the base of the skull through the vertex without intravenous contrast. RADIATION DOSE REDUCTION: This exam was performed according to the departmental dose-optimization program which includes automated exposure control, adjustment of the mA and/or kV according to patient size and/or use of iterative reconstruction technique. COMPARISON:  CT head dated October 24, 2015. FINDINGS: Brain: No evidence of acute infarction, hemorrhage, hydrocephalus, extra-axial collection or mass lesion/mass effect. Stable mild atrophy and chronic microvascular ischemic changes. Vascular: Atherosclerotic vascular calcification of the carotid siphons. No hyperdense vessel. Skull: Normal. Negative for fracture or focal lesion. Sinuses/Orbits: No acute finding.  Other: None. IMPRESSION: 1. No acute intracranial abnormality. Electronically Signed   By: Titus Dubin M.D.   On: 12/14/2021 12:54  ? ?MR BRAIN WO CONTRAST ? ?Result Date: 12/14/2021 ?CLINICAL DATA:  Transient ischemi

## 2021-12-15 ENCOUNTER — Inpatient Hospital Stay (HOSPITAL_COMMUNITY): Payer: Medicare PPO

## 2021-12-15 DIAGNOSIS — R918 Other nonspecific abnormal finding of lung field: Secondary | ICD-10-CM | POA: Diagnosis present

## 2021-12-15 DIAGNOSIS — C787 Secondary malignant neoplasm of liver and intrahepatic bile duct: Secondary | ICD-10-CM | POA: Diagnosis not present

## 2021-12-15 DIAGNOSIS — I10 Essential (primary) hypertension: Secondary | ICD-10-CM | POA: Diagnosis not present

## 2021-12-15 DIAGNOSIS — I63312 Cerebral infarction due to thrombosis of left middle cerebral artery: Secondary | ICD-10-CM

## 2021-12-15 DIAGNOSIS — C189 Malignant neoplasm of colon, unspecified: Secondary | ICD-10-CM | POA: Diagnosis not present

## 2021-12-15 DIAGNOSIS — I6389 Other cerebral infarction: Secondary | ICD-10-CM | POA: Diagnosis not present

## 2021-12-15 LAB — ECHOCARDIOGRAM COMPLETE
AR max vel: 2.32 cm2
AV Area VTI: 2.23 cm2
AV Area mean vel: 2.13 cm2
AV Mean grad: 7 mmHg
AV Peak grad: 12.4 mmHg
Ao pk vel: 1.76 m/s
Area-P 1/2: 4.93 cm2
Height: 61 in
S' Lateral: 2.2 cm
Weight: 1932.99 oz

## 2021-12-15 LAB — HEMOGLOBIN A1C
Hgb A1c MFr Bld: 5.7 % — ABNORMAL HIGH (ref 4.8–5.6)
Mean Plasma Glucose: 116.89 mg/dL

## 2021-12-15 LAB — LIPID PANEL
Cholesterol: 192 mg/dL (ref 0–200)
HDL: 44 mg/dL (ref >40)
LDL Cholesterol: 141 mg/dL — ABNORMAL HIGH (ref 0–99)
Total CHOL/HDL Ratio: 4.4 RATIO
Triglycerides: 35 mg/dL (ref <150)
VLDL: 7 mg/dL (ref 0–40)

## 2021-12-15 MED ORDER — ATORVASTATIN CALCIUM 40 MG PO TABS
40.0000 mg | ORAL_TABLET | Freq: Every day | ORAL | Status: DC
  Administered 2021-12-15 – 2021-12-16 (×2): 40 mg via ORAL
  Filled 2021-12-15 (×3): qty 1

## 2021-12-15 MED ORDER — PERFLUTREN LIPID MICROSPHERE
1.0000 mL | INTRAVENOUS | Status: AC | PRN
  Administered 2021-12-15: 2 mL via INTRAVENOUS
  Filled 2021-12-15: qty 10

## 2021-12-15 MED ORDER — GABAPENTIN 400 MG PO CAPS
400.0000 mg | ORAL_CAPSULE | Freq: Three times a day (TID) | ORAL | Status: DC
  Administered 2021-12-15 – 2021-12-16 (×4): 400 mg via ORAL
  Filled 2021-12-15 (×4): qty 1

## 2021-12-15 MED ORDER — ASPIRIN EC 81 MG PO TBEC
81.0000 mg | DELAYED_RELEASE_TABLET | Freq: Every day | ORAL | Status: DC
  Administered 2021-12-15 – 2021-12-16 (×2): 81 mg via ORAL
  Filled 2021-12-15 (×3): qty 1

## 2021-12-15 MED ORDER — CLOPIDOGREL BISULFATE 75 MG PO TABS
75.0000 mg | ORAL_TABLET | Freq: Every day | ORAL | Status: DC
  Administered 2021-12-15 – 2021-12-16 (×2): 75 mg via ORAL
  Filled 2021-12-15 (×3): qty 1

## 2021-12-15 MED ORDER — ASPIRIN 325 MG PO TABS
325.0000 mg | ORAL_TABLET | Freq: Once | ORAL | Status: AC
  Administered 2021-12-15: 325 mg via ORAL
  Filled 2021-12-15: qty 1

## 2021-12-15 NOTE — Progress Notes (Signed)
Refusing aspirin, plavix, and lipitor. Educated patient on purpose of these medications. Notified MD.  ?Era Bumpers, RN  ?

## 2021-12-15 NOTE — Progress Notes (Signed)
Echocardiogram ?2D Echocardiogram has been performed. ? ?Joette Catching RDCS ?12/15/2021, 9:51 AM ?

## 2021-12-15 NOTE — Progress Notes (Addendum)
STROKE TEAM PROGRESS NOTE   INTERVAL HISTORY Patient is seen in her room with no family at the bedside.  Yesterday, she experienced acute onset weakness of her right leg.  MRI reveals acute infarct in the left centrum semioval and caudate tail.  Vitals:   12/15/21 0105 12/15/21 0205 12/15/21 0545 12/15/21 0804  BP: 105/69 (!) 144/127 (!) 151/85 (!) 145/88  Pulse: 77 78 75 73  Resp:  16 17 18   Temp:    97.6 F (36.4 C)  TempSrc:    Oral  SpO2: 100% 99% 98% 98%  Weight:      Height:       CBC:  Recent Labs  Lab 12/14/21 1310  WBC 5.8  NEUTROABS 4.0  HGB 11.6*  HCT 37.4  MCV 89.0  PLT 257   Basic Metabolic Panel:  Recent Labs  Lab 12/14/21 1310  NA 142  K 4.5  CL 104  CO2 29  GLUCOSE 91  BUN 17  CREATININE 0.94  CALCIUM 9.6  MG 1.8   Lipid Panel:  Recent Labs  Lab 12/15/21 0353  CHOL 192  TRIG 35  HDL 44  CHOLHDL 4.4  VLDL 7  LDLCALC 141*   HgbA1c:  Recent Labs  Lab 12/15/21 0353  HGBA1C 5.7*   Urine Drug Screen: No results for input(s): LABOPIA, COCAINSCRNUR, LABBENZ, AMPHETMU, THCU, LABBARB in the last 168 hours.  Alcohol Level No results for input(s): ETH in the last 168 hours.  IMAGING past 24 hours CT Head Wo Contrast  Result Date: 12/14/2021 CLINICAL DATA:  Right lower leg numbness since Sunday. History of cancer. EXAM: CT HEAD WITHOUT CONTRAST TECHNIQUE: Contiguous axial images were obtained from the base of the skull through the vertex without intravenous contrast. RADIATION DOSE REDUCTION: This exam was performed according to the departmental dose-optimization program which includes automated exposure control, adjustment of the mA and/or kV according to patient size and/or use of iterative reconstruction technique. COMPARISON:  CT head dated October 24, 2015. FINDINGS: Brain: No evidence of acute infarction, hemorrhage, hydrocephalus, extra-axial collection or mass lesion/mass effect. Stable mild atrophy and chronic microvascular ischemic changes.  Vascular: Atherosclerotic vascular calcification of the carotid siphons. No hyperdense vessel. Skull: Normal. Negative for fracture or focal lesion. Sinuses/Orbits: No acute finding. Other: None. IMPRESSION: 1. No acute intracranial abnormality. Electronically Signed   By: Obie Dredge M.D.   On: 12/14/2021 12:54   MR BRAIN WO CONTRAST  Result Date: 12/14/2021 CLINICAL DATA:  Transient ischemic attack EXAM: MRI HEAD WITHOUT CONTRAST TECHNIQUE: Multiplanar, multiecho pulse sequences of the brain and surrounding structures were obtained without intravenous contrast. COMPARISON:  10/06/2015 FINDINGS: Brain: Small acute infarcts of the left caudate tail and anterior left centrum semiovale. No acute or chronic hemorrhage. There is multifocal hyperintense T2-weighted signal within the white matter. Generalized cerebral volume loss. The midline structures are normal. Vascular: Major flow voids are preserved. Skull and upper cervical spine: Normal calvarium and skull base. Visualized upper cervical spine and soft tissues are normal. Sinuses/Orbits:No paranasal sinus fluid levels or advanced mucosal thickening. No mastoid or middle ear effusion. Normal orbits. IMPRESSION: Small acute infarcts of the left caudate tail and anterior left centrum semiovale. No hemorrhage or mass effect. Electronically Signed   By: Deatra Robinson M.D.   On: 12/14/2021 21:42   DG CHEST PORT 1 VIEW  Result Date: 12/15/2021 CLINICAL DATA:  Stroke. EXAM: PORTABLE CHEST 1 VIEW COMPARISON:  Chest x-ray 12/14/2021.  Chest CT 11/16/2020. FINDINGS: Right chest port catheter tip  projects over the SVC, unchanged. The cardiomediastinal silhouette is stable. Right mid lung rounded mass, right upper lobe pulmonary nodule in left mid lung airspace disease appears similar to prior. There is no pneumothorax or pleural effusion. No acute fractures are seen. IMPRESSION: 1. Unchanged bilateral pulmonary masses and airspace disease. Electronically Signed    By: Darliss Cheney M.D.   On: 12/15/2021 00:14   ECHOCARDIOGRAM COMPLETE  Result Date: 12/15/2021    ECHOCARDIOGRAM REPORT   Patient Name:   Cynthia [name] Date of Exam: 12/15/2021 Medical Rec #:  [id number]           Height:       61.0 in Accession #:    [id number]          Weight:       120.8 lb Date of Birth:  1936/08/07          BSA:          1.525 m Patient Age:    85 years            BP:           145/88 mmHg Patient Gender: F                   HR:           75 bpm. Exam Location:  Inpatient Procedure: 2D Echo, Color Doppler, Cardiac Doppler and Intracardiac            Opacification Agent Indications:    Stroke i63.9  History:        Patient has prior history of Echocardiogram examinations, most                 recent 10/08/2015. Risk Factors:Hypertension.  Sonographer:    Rodrigo Ran RDCS Referring Phys: [id number] ANASTASSIA DOUTOVA IMPRESSIONS  1. Small sub centimeter apical diverticulum seen with Definity contrast. No evidence of thrombus. No evidence of apical hypertrophy.. Left ventricular ejection fraction, by estimation, is 60 to 65%. The left ventricle has normal function. The left ventricle has no regional wall motion abnormalities. Left ventricular diastolic parameters are consistent with Grade I diastolic dysfunction (impaired relaxation).  2. Right ventricular systolic function is normal. The right ventricular size is normal. There is mildly elevated pulmonary artery systolic pressure. The estimated right ventricular systolic pressure is 43.2 mmHg.  3. The mitral valve is normal in structure. No evidence of mitral valve regurgitation. No evidence of mitral stenosis.  4. Tricuspid valve regurgitation is moderate.  5. The aortic valve is tricuspid. There is mild calcification of the aortic valve. There is mild thickening of the aortic valve. Aortic valve regurgitation is trivial. No aortic stenosis is present.  6. The inferior vena cava is normal in size with greater than 50% respiratory  variability, suggesting right atrial pressure of 3 mmHg. Conclusion(s)/Recommendation(s): No intracardiac source of embolism detected on this transthoracic study. Consider a transesophageal echocardiogram to exclude cardiac source of embolism if clinically indicated. FINDINGS  Left Ventricle: Small sub centimeter apical diverticulum seen with Definity contrast. No evidence of thrombus. No evidence of apical hypertrophy. Left ventricular ejection fraction, by estimation, is 60 to 65%. The left ventricle has normal function. The left ventricle has no regional wall motion abnormalities. Definity contrast agent was given IV to delineate the left ventricular endocardial borders. The left ventricular internal cavity size was normal in size. There is no left ventricular hypertrophy. Left ventricular diastolic parameters are consistent with Grade I diastolic dysfunction (impaired relaxation).  Right Ventricle: The right ventricular size is normal. No increase in right ventricular wall thickness. Right ventricular systolic function is normal. There is mildly elevated pulmonary artery systolic pressure. The tricuspid regurgitant velocity is 3.17  m/s, and with an assumed right atrial pressure of 3 mmHg, the estimated right ventricular systolic pressure is 43.2 mmHg. Left Atrium: Left atrial size was normal in size. Right Atrium: Right atrial size was normal in size. Pericardium: There is no evidence of pericardial effusion. Mitral Valve: The mitral valve is normal in structure. No evidence of mitral valve regurgitation. No evidence of mitral valve stenosis. Tricuspid Valve: The tricuspid valve is normal in structure. Tricuspid valve regurgitation is moderate . No evidence of tricuspid stenosis. Aortic Valve: The aortic valve is tricuspid. There is mild calcification of the aortic valve. There is mild thickening of the aortic valve. Aortic valve regurgitation is trivial. No aortic stenosis is present. Aortic valve mean gradient  measures 7.0 mmHg. Aortic valve peak gradient measures 12.4 mmHg. Aortic valve area, by VTI measures 2.23 cm. Pulmonic Valve: The pulmonic valve was normal in structure. Pulmonic valve regurgitation is not visualized. No evidence of pulmonic stenosis. Aorta: The aortic root is normal in size and structure. Venous: The inferior vena cava is normal in size with greater than 50% respiratory variability, suggesting right atrial pressure of 3 mmHg. IAS/Shunts: No atrial level shunt detected by color flow Doppler.  LEFT VENTRICLE PLAX 2D LVIDd:         3.30 cm   Diastology LVIDs:         2.20 cm   LV e' medial:    5.33 cm/s LV PW:         0.70 cm   LV E/e' medial:  14.1 LV IVS:        0.90 cm   LV e' lateral:   6.74 cm/s LVOT diam:     2.00 cm   LV E/e' lateral: 11.2 LV SV:         80 LV SV Index:   53 LVOT Area:     3.14 cm  RIGHT VENTRICLE             IVC RV Basal diam:  3.60 cm     IVC diam: 1.00 cm RV Mid diam:    2.40 cm RV S prime:     15.90 cm/s TAPSE (M-mode): 2.3 cm LEFT ATRIUM             Index LA diam:        2.20 cm 1.44 cm/m LA Vol (A2C):   48.5 ml 31.81 ml/m LA Vol (A4C):   34.4 ml 22.56 ml/m LA Biplane Vol: 41.7 ml 27.35 ml/m  AORTIC VALVE                     PULMONIC VALVE AV Area (Vmax):    2.32 cm      PV Vmax:       0.88 m/s AV Area (Vmean):   2.13 cm      PV Peak grad:  3.1 mmHg AV Area (VTI):     2.23 cm AV Vmax:           176.00 cm/s AV Vmean:          120.000 cm/s AV VTI:            0.359 m AV Peak Grad:      12.4 mmHg AV Mean Grad:      7.0 mmHg  LVOT Vmax:         130.00 cm/s LVOT Vmean:        81.300 cm/s LVOT VTI:          0.255 m LVOT/AV VTI ratio: 0.71  AORTA Ao Root diam: 3.00 cm Ao Asc diam:  3.40 cm MITRAL VALVE               TRICUSPID VALVE MV Area (PHT): 4.93 cm    TR Peak grad:   40.2 mmHg MV Decel Time: 154 msec    TR Vmax:        317.00 cm/s MV E velocity: 75.40 cm/s MV A velocity: 96.70 cm/s  SHUNTS MV E/A ratio:  0.78        Systemic VTI:  0.26 m                             Systemic Diam: 2.00 cm Donato Schultz MD Electronically signed by Donato Schultz MD Signature Date/Time: 12/15/2021/11:31:56 AM    Final     PHYSICAL EXAM General:  Alert, well-developed, well-nourished patient in no acute distress Respiratory;  Regular, unlabored respirations on room air  NEURO:  Mental Status: AA&Ox3  Speech/Language: speech is without dysarthria or aphasia.  Fluency, and comprehension intact.  Cranial Nerves:  II: PERRL.  III, IV, VI: EOMI. Eyelids elevate symmetrically.  V: Sensation is intact to light touch and symmetrical to face.  VII: Smile is symmetrical.   VIII: hearing intact to voice. IX, X:Phonation is normal.  XII: tongue is midline without fasciculations. Motor: 5/5 strength to all muscle groups tested.  Tone: is normal and bulk is normal Sensation- Intact to light touch bilaterally.  Coordination: FTN intact bilaterally.No drift.  Gait- deferred   ASSESSMENT/PLAN [name] is a 86 y.o. female with history of colon cancer with hepatic metastases, cervical cancer=, DVT, GBS, HTN and stroke presenting with  acute onset weakness of her right leg.  MRI reveals acute infarct in the left centrum semiovale and caudate tail.  Stroke:  left MCA infarct in left centrum semoivale and caudate tail likely secondary to small vessel disease source CT head No acute abnormality.  MRI  small acute infarcts of left caudate tail and left centrum semiovale MRA mild left VA distal stenosis Carotid Doppler unremarkable 2D Echo EF 60-65%, grade 1 diastolic dysfunction, no atrial level shunt Given 2 separate location of stroke, will recommend 30-day CardioNet monitor as outpatient to rule out A-fib LDL 141 HgbA1c 5.7 VTE prophylaxis - SCDs No antithrombotic prior to admission, now on aspirin 81 mg daily and clopidogrel 75 mg daily for 3 weeks and then aspirin alone..  Therapy recommendations: Outpatient PT Disposition:  likely home  ? Hx of TIA  12/2014 admitted  for confusion and numbness.  Stroke work-up all negative including MRI, carotid Doppler and MRI.  LDL 164, A1c 5.8.  Discharged on aspirin and Lipitor 40.  Hypertension Home meds:  none Stable Long-term BP goal normotensive  Hyperlipidemia Home meds:  none LDL 141, goal < 70 Add atorvastatin 40 mg daily  Continue statin at discharge  Other Stroke Risk Factors Advanced Age >/= 43   Other Active Problems History of metastatic colon cancer to liver Follow up with outpatient oncologist History of GBS in 10/2015 status post IVIG, residual bilateral feet tingling  Hospital day # 1  Cynthia [name] , MSN, AGACNP-BC [facility] See Amion for schedule and  pager information 12/15/2021 11:56 AM   ATTENDING NOTE: I reviewed above note and agree with the assessment and plan. Pt was seen and examined.   86 year old female with history of colon cancer metastasis to liver s/p surgery with right colostomy and chemotherapy, history of cervical cancer, remote provoked right LE DVT, GBS in 10/2015 with residual bilateral feet tingling, hypertension admitted for right leg weakness.  CT no acute abnormality.  MRI showed left CR and SO small infarcts.  MRI mild left VA distal stenosis.  Carotid Doppler unremarkable.  EF 60 to 65%.  LDL 141, A1c 5.7.  On exam, patient awake alert orientated.  Her daughter at bedside.  Right leg weakness much improved.  No focal neurologic deficit seen.  Etiology for patient stroke likely small vessel disease given subcortical location.  However due to 2 separate infarcts, recommend 30-day cardiac event monitor as outpatient to rule out A-fib.  Continue DAPT for 3 weeks and then aspirin alone.  Continue Lipitor.  PT/OT recommend outpatient PT/OT.  For detailed assessment and plan, please refer to above as I have made changes wherever appropriate.   Neurology will sign off. Please call with questions. Pt will follow up with Dr. Everlena Cooper at [facility] in about 4  weeks. Thanks for the consult.   Marvel Plan, MD PhD Stroke Neurology 12/15/2021 5:32 PM    To contact Stroke Continuity provider, please refer to [email or web address]. After hours, contact General Neurology

## 2021-12-15 NOTE — Progress Notes (Signed)
Carotid duplex bilateral study completed.   Please see CV Proc for preliminary results.   Carsynn Bethune, RDMS, RVT  

## 2021-12-15 NOTE — ED Notes (Addendum)
Patient states "I dont do anything with contrast injections d/t risk of increase of GBS flare-up." Patient refused MRA at this time. RN explained to patient the risks of not getting this imaging. Patient states she understands the risks and the severity of the medical situation. MD Bridgett Larsson made aware of the situation. No new orders from MD at this time. ?

## 2021-12-15 NOTE — Progress Notes (Signed)
?PROGRESS NOTE ? ? ? ?LAPORSCHE HOEGER  WPY:[id number] DOB: 1935/12/10 DOA: 12/14/2021 ?PCP: Jonathon Jordan, MD  ?86/M with history of metastatic colon cancer, no longer on active treatment chronic anemia, history of Guillain-Barr? syndrome, hypertension, prior stroke presented with acute onset right lower extremity weakness. ?-MRI noted acute CVA, neurology consulting ? ? ?Subjective: ?-Feels okay overall, right leg weakness is improving ? ?Assessment and Plan: ? ?Acute ischemic stroke ?-MRI positive for acute left centrum semiovale stroke and left caudate tail ?-Neurology consulting, started on aspirin, Plavix ?-PT eval pending ?-Echo with preserved EF, grade 1 diastolic dysfunction, carotid duplex and MRA pending ?-LDL is 141, HbA1c is 5.7 ? ?Dyslipidemia ?-LDL 141 as noted above, started on statin ? ?Metastatic colon cancer ?-With liver and pulmonary mets ?-Prior history of colon resection, followed by chemo and XRT ?-No longer under treatment, followed by Dr. Benay Spice ? ?Chronic anemia ?-Secondary to above, mild ? ? ? ?Code status: Discussed CODE STATUS with the patient today, she wishes to be a DNR ? ?DVT prophylaxis: SCDs  ?Family Communication: Discussed with patient and daughter at bedside ?Disposition Plan: Home tomorrow if stable ? ?Consultants:  ?Neurology ? ?Procedures:  ? ?Antimicrobials:  ? ? ?Objective: ?Vitals:  ? 12/15/21 0105 12/15/21 0205 12/15/21 0539 12/15/21 0804  ?BP: 105/69 (!) 144/127 (!) 151/85 (!) 145/88  ?Pulse: 77 78 75 73  ?Resp:  '16 17 18  '$ ?Temp:    97.6 ?F (36.4 ?C)  ?TempSrc:    Oral  ?SpO2: 100% 99% 98% 98%  ?Weight:      ?Height:      ? ? ?Intake/Output Summary (Last 24 hours) at 12/15/2021 1416 ?Last data filed at 12/14/2021 1537 ?Gross per 24 hour  ?Intake 503.54 ml  ?Output --  ?Net 503.54 ml  ? ?Filed Weights  ? 12/14/21 1205  ?Weight: 54.8 kg  ? ? ?Examination: ? ?General exam: Appears calm and comfortable, AAOx3 ?HEENT: Port noted ?Respiratory system: Clear to  auscultation ?Cardiovascular system: S1 & S2 heard, RRR.  ?Abd: nondistended, soft and nontender.Normal bowel sounds heard. ?Central nervous system: Alert and oriented.  Minimal right leg weakness ?Extremities: no edema ?Skin: No rashes ?Psychiatry:  Mood & affect appropriate.  ? ? ? ?Data Reviewed:  ? ?CBC: ?Recent Labs  ?Lab 12/14/21 ?1310  ?WBC 5.8  ?NEUTROABS 4.0  ?HGB 11.6*  ?HCT 37.4  ?MCV 89.0  ?PLT 257  ? ?Basic Metabolic Panel: ?Recent Labs  ?Lab 12/14/21 ?1310  ?NA 142  ?K 4.5  ?CL 104  ?CO2 29  ?GLUCOSE 91  ?BUN 17  ?CREATININE 0.94  ?CALCIUM 9.6  ?MG 1.8  ? ?GFR: ?Estimated Creatinine Clearance: 34.2 mL/min (by C-G formula based on SCr of 0.94 mg/dL). ?Liver Function Tests: ?Recent Labs  ?Lab 12/14/21 ?1310  ?AST 20  ?ALT 9  ?ALKPHOS 49  ?BILITOT 0.4  ?PROT 7.2  ?ALBUMIN 3.8  ? ?No results for input(s): LIPASE, AMYLASE in the last 168 hours. ?No results for input(s): AMMONIA in the last 168 hours. ?Coagulation Profile: ?Recent Labs  ?Lab 12/14/21 ?1310  ?INR 1.1  ? ?Cardiac Enzymes: ?No results for input(s): CKTOTAL, CKMB, CKMBINDEX, TROPONINI in the last 168 hours. ?BNP (last 3 results) ?No results for input(s): PROBNP in the last 8760 hours. ?HbA1C: ?Recent Labs  ?  12/15/21 ?0353  ?HGBA1C 5.7*  ? ?CBG: ?No results for input(s): GLUCAP in the last 168 hours. ?Lipid Profile: ?Recent Labs  ?  12/15/21 ?0353  ?CHOL 192  ?HDL 44  ?LDLCALC 141*  ?  TRIG 35  ?CHOLHDL 4.4  ? ?Thyroid Function Tests: ?Recent Labs  ?  12/14/21 ?1310  ?TSH 2.310  ? ?Anemia Panel: ?No results for input(s): VITAMINB12, FOLATE, FERRITIN, TIBC, IRON, RETICCTPCT in the last 72 hours. ?Urine analysis: ?   ?Component Value Date/Time  ? [place] YELLOW 11/07/2017 1115  ? APPEARANCEUR CLEAR 11/07/2017 1115  ? LABSPEC 1.020 11/07/2017 1115  ? PHURINE 5.0 11/07/2017 1115  ? GLUCOSEU NEGATIVE 11/07/2017 1115  ? [place] NEGATIVE 11/07/2017 1115  ? [place] NEGATIVE 11/07/2017 1115  ? [place] NEGATIVE 11/07/2017 1115  ? PROTEINUR NEGATIVE  10/27/2020 1145  ? UROBILINOGEN 0.2 01/11/2015 1725  ? NITRITE NEGATIVE 11/07/2017 1115  ? LEUKOCYTESUR TRACE (A) 11/07/2017 1115  ? ?Sepsis Labs: ?'@LABRCNTIP'$ (procalcitonin:4,lacticidven:4) ? ?)No results found for this or any previous visit (from the past 240 hour(s)).  ? ?Radiology Studies: ?CT Head Wo Contrast ? ?Result Date: 12/14/2021 ?CLINICAL DATA:  Right lower leg numbness since Sunday. History of cancer. EXAM: CT HEAD WITHOUT CONTRAST TECHNIQUE: Contiguous axial images were obtained from the base of the skull through the vertex without intravenous contrast. RADIATION DOSE REDUCTION: This exam was performed according to the departmental dose-optimization program which includes automated exposure control, adjustment of the mA and/or kV according to patient size and/or use of iterative reconstruction technique. COMPARISON:  CT head dated October 24, 2015. FINDINGS: Brain: No evidence of acute infarction, hemorrhage, hydrocephalus, extra-axial collection or mass lesion/mass effect. Stable mild atrophy and chronic microvascular ischemic changes. Vascular: Atherosclerotic vascular calcification of the carotid siphons. No hyperdense vessel. Skull: Normal. Negative for fracture or focal lesion. Sinuses/Orbits: No acute finding. Other: None. IMPRESSION: 1. No acute intracranial abnormality. Electronically Signed   By: Titus Dubin M.D.   On: 12/14/2021 12:54  ? ?MR BRAIN WO CONTRAST ? ?Result Date: 12/14/2021 ?CLINICAL DATA:  Transient ischemic attack EXAM: MRI HEAD WITHOUT CONTRAST TECHNIQUE: Multiplanar, multiecho pulse sequences of the brain and surrounding structures were obtained without intravenous contrast. COMPARISON:  10/06/2015 FINDINGS: Brain: Small acute infarcts of the left caudate tail and anterior left centrum semiovale. No acute or chronic hemorrhage. There is multifocal hyperintense T2-weighted signal within the white matter. Generalized cerebral volume loss. The midline structures are normal.  Vascular: Major flow voids are preserved. Skull and upper cervical spine: Normal calvarium and skull base. Visualized upper cervical spine and soft tissues are normal. Sinuses/Orbits:No paranasal sinus fluid levels or advanced mucosal thickening. No mastoid or middle ear effusion. Normal orbits. IMPRESSION: Small acute infarcts of the left caudate tail and anterior left centrum semiovale. No hemorrhage or mass effect. Electronically Signed   By: Ulyses Jarred M.D.   On: 12/14/2021 21:42  ? ?DG CHEST PORT 1 VIEW ? ?Result Date: 12/15/2021 ?CLINICAL DATA:  Stroke. EXAM: PORTABLE CHEST 1 VIEW COMPARISON:  Chest x-ray 12/14/2021.  Chest CT 11/16/2020. FINDINGS: Right chest port catheter tip projects over the SVC, unchanged. The cardiomediastinal silhouette is stable. Right mid lung rounded mass, right upper lobe pulmonary nodule in left mid lung airspace disease appears similar to prior. There is no pneumothorax or pleural effusion. No acute fractures are seen. IMPRESSION: 1. Unchanged bilateral pulmonary masses and airspace disease. Electronically Signed   By: Ronney Asters M.D.   On: 12/15/2021 00:14  ? ?ECHOCARDIOGRAM COMPLETE ? ?Result Date: 12/15/2021 ?   ECHOCARDIOGRAM REPORT   Patient Name:   ALEXSUS PAPADOPOULOS Date of Exam: 12/15/2021 Medical Rec #:  [id number]           Height:  61.0 in Accession #:    [id number]          Weight:       120.8 lb Date of Birth:  [birth year]          BSA:          1.525 m? Patient Age:    60 years            BP:           145/88 mmHg Patient Gender: F                   HR:           75 bpm. Exam Location:  Inpatient Procedure: 2D Echo, Color Doppler, Cardiac Doppler and Intracardiac            Opacification Agent Indications:    Stroke i63.9  History:        Patient has prior history of Echocardiogram examinations, most                 recent 10/08/2015. Risk Factors:Hypertension.  Sonographer:    Joette Catching RDCS Referring Phys: Wilber  1. Small sub  centimeter apical diverticulum seen with Definity contrast. No evidence of thrombus. No evidence of apical hypertrophy.. Left ventricular ejection fraction, by estimation, is 60 to 65%. The left ventricle has normal function

## 2021-12-15 NOTE — Evaluation (Signed)
Physical Therapy Evaluation ?Patient Details ?Name: Cynthia [name] ?MRN: [id number] ?DOB: Nov 20, 1935 ?Today's Date: 12/15/2021 ? ?History of Present Illness ? 86 y/o female presented to [facility] ED on 12/14/21 for R LE numbness. MRI showed small acute infarcts of L caudate tail and anterior L centrum semiovale. Transferred to [facility]. PMH: hx of Guillian Barre, hx of CVA, hx of colon cancer s/p colectomy with hepatic metastasis, pulmonary nodules.  ?Clinical Impression ? Patient admitted with the above. Patient presents with R LE weakness, impaired balance, and decreased activity tolerance. Patient preferring use of RW at this time due to R LE weakness and fear of falling. Patient ambulating in hallway with RW and min guard initially progressing to supervision. Encouraged continued mobility in hallway with nursing staff. Instructed patient on SLR, hip abduction, heel slides, and ankle pumps to perform while in bed. Patient will benefit from skilled PT services during acute stay to address listed deficits. Recommend OPPT to address strength and balance deficits.    ?   ? ?Recommendations for follow up therapy are one component of a multi-disciplinary discharge planning process, led by the attending physician.  Recommendations may be updated based on patient status, additional functional criteria and insurance authorization. ? ?Follow Up Recommendations Outpatient PT ? ?  ?Assistance Recommended at Discharge Set up Supervision/Assistance  ?Patient can return home with the following ?   ? ?  ?Equipment Recommendations None recommended by PT  ?Recommendations for Other Services ?    ?  ?Functional Status Assessment Patient has had a recent decline in their functional status and demonstrates the ability to make significant improvements in function in a reasonable and predictable amount of time.  ? ?  ?Precautions / Restrictions Precautions ?Precautions: Fall ?Restrictions ?Weight Bearing Restrictions: No  ? ?   ? ?Mobility ? Bed Mobility ?Overal bed mobility: Modified Independent ?  ?  ?  ?  ?  ?  ?  ?  ? ?Transfers ?Overall transfer level: Modified independent ?Equipment used: Rolling Josten [place]uth (2 wheels) ?  ?  ?  ?  ?  ?  ?  ?  ?  ? ?Ambulation/Gait ?Ambulation/Gait assistance: Supervision, Min guard ?Gait Distance (Feet): 75 Feet ?Assistive device: Rolling Hadlie Gipson (2 wheels) ?Gait Pattern/deviations: Step-through pattern, Decreased stride length, Decreased stance time - right ?Gait velocity: decreased ?  ?  ?General Gait Details: min guard progressing to supervision with RW. Patient with heavy reliance on RW due to R LE weakness and fear. ? ?Stairs ?  ?  ?  ?  ?  ? ?Wheelchair Mobility ?  ? ?Modified Rankin (Stroke Patients Only) ?Modified Rankin (Stroke Patients Only) ?Pre-Morbid Rankin Score: Slight disability ?Modified Rankin: Moderate disability ? ?  ? ?Balance Overall balance assessment: Needs assistance ?Sitting-balance support: No upper extremity supported, Feet supported ?Sitting balance-Leahy Scale: Good ?  ?  ?Standing balance support: Bilateral upper extremity supported, Reliant on assistive device for balance ?Standing balance-Leahy Scale: Fair ?  ?  ?  ?  ?  ?  ?  ?  ?  ?  ?  ?  ?   ? ? ? ?Pertinent Vitals/Pain Pain Assessment ?Pain Assessment: No/denies pain  ? ? ?Home Living Family/patient expects to be discharged to:: Private residence ?Living Arrangements: Children ?Available Help at Discharge: Family;Available 24 hours/day ?Type of Home: House ?Home Access: Ramped entrance ?  ?  ?  ?Home Layout: One level ?Home Equipment: Kasandra Knudsen - quad;Cane - single point;BSC/3in1;Shower seat;Wheelchair - manual;Grab bars - tub/shower;Grab bars - toilet;Rolling  Josey Dettmann (2 wheels) ?   ?  ?Prior Function Prior Level of Function : Independent/Modified Independent ?  ?  ?  ?  ?  ?  ?Mobility Comments: normally uses SPC for mobility ?ADLs Comments: independent ADLs, limited IADLs (daughter drives) ?  ? ? ?Hand Dominance  ?  Dominant Hand: Right ? ?  ?Extremity/Trunk Assessment  ? Upper Extremity Assessment ?Upper Extremity Assessment: Defer to OT evaluation ?  ? ?Lower Extremity Assessment ?Lower Extremity Assessment: Generalized weakness;RLE deficits/detail ?RLE Deficits / Details: grossly 4/5 ?  ? ?   ?Communication  ? Communication: No difficulties  ?Cognition Arousal/Alertness: Awake/alert ?Behavior During Therapy: Anxious ?Overall Cognitive Status: Impaired/Different from baseline ?Area of Impairment: Memory, Awareness, Problem solving ?  ?  ?  ?  ?  ?  ?  ?  ?  ?  ?Memory: Decreased short-term memory ?  ?  ?Awareness: Emergent ?Problem Solving: Slow processing, Requires verbal cues ?General Comments: pt with decreased STM, reports this is baseline.  Follows commands and engages appropriately.  Slow processing and anxious with mobility. ?  ?  ? ?  ?General Comments   ? ?  ?Exercises    ? ?Assessment/Plan  ?  ?PT Assessment Patient needs continued PT services  ?PT Problem List Decreased activity tolerance;Decreased strength;Decreased balance ? ?   ?  ?PT Treatment Interventions DME instruction;Gait training;Therapeutic activities;Functional mobility training;Therapeutic exercise;Balance training;Patient/family education   ? ?PT Goals (Current goals can be found in the Care Plan section)  ?Acute Rehab PT Goals ?Patient Stated Goal: to go home ?PT Goal Formulation: With patient ?Time For Goal Achievement: 12/29/21 ?Potential to Achieve Goals: Good ? ?  ?Frequency Min 3X/week ?  ? ? ?Co-evaluation   ?  ?  ?  ?  ? ? ?  ?AM-PAC PT "6 Clicks" Mobility  ?Outcome Measure Help needed turning from your back to your side while in a flat bed without using bedrails?: None ?Help needed moving from lying on your back to sitting on the side of a flat bed without using bedrails?: None ?Help needed moving to and from a bed to a chair (including a wheelchair)?: None ?Help needed standing up from a chair using your arms (e.g., wheelchair or bedside  chair)?: A Little ?Help needed to walk in hospital room?: A Little ?Help needed climbing 3-5 steps with a railing? : A Little ?6 Click Score: 21 ? ?  ?End of Session Equipment Utilized During Treatment: Gait belt ?Activity Tolerance: Patient tolerated treatment well ?Patient left: in bed;with call bell/phone within reach ?Nurse Communication: Mobility status ?PT Visit Diagnosis: Unsteadiness on feet (R26.81);Muscle weakness (generalized) (M62.81) ?  ? ?Time: 4540-9811 ?PT Time Calculation (min) (ACUTE ONLY): 30 min ? ? ?Charges:   PT Evaluation ?$PT Eval Low Complexity: 1 Low ?PT Treatments ?$Gait Training: 8-22 mins ?  ?   ? ? ?Nazar Kuan A. Gilford Rile, PT, DPT ?Acute Rehabilitation Services ?Pager [phone] ?Office [phone] ? ? ?Devarion Mcclanahan A Elysabeth Aust ?12/15/2021, 5:04 PM ? ?

## 2021-12-15 NOTE — Assessment & Plan Note (Signed)
continue neurontin ?

## 2021-12-15 NOTE — Assessment & Plan Note (Signed)
-   will admit based on TIA/CVA protocol,  ?      Monitor on Tele ?      /MRI Resulted - showing acute ischemic CVA ?MRA pending ?      CTA pt refused ?Carotid Doppler ordered ?      Echo to evaluate for possible embolic source,  ?      obtain cardiac enzymes,  ECG,   Lipid panel, TSH.  ?      Order PT/OT evaluation.  ?      keep nothing by mouth until passes swallow eval        Will make sure patient is on antiplatelet ASA 81   and statin  ?      Allow permissive Hypertension keep BP <220/120  ?      Neurology consulted Have seen pt in ER  ? ?

## 2021-12-15 NOTE — Evaluation (Signed)
Occupational Therapy Evaluation ?Patient Details ?Name: Cynthia [name] ?MRN: [id number] ?DOB: 1935-11-09 ?Today's Date: 12/15/2021 ? ? ?History of Present Illness 86 y/o female presented to [facility] ED on 12/14/21 for R LE numbness. MRI showed small acute infarcts of L caudate tail and anterior L centrum semiovale. Transferred to [facility]. PMH: hx of Guillian Barre, hx of CVA, hx of colon cancer s/p colectomy with hepatic metastasis, pulmonary nodules.  ? ?Clinical Impression ?  ?PTA patient reports independent with ADLs, mobility using cane and limited IADLs. Admitted for above and presents with impaired balance, decreased activity tolerance, and generalized weakness.  Patient oriented and follows commands, demonstrates decreased awareness, slow processing and is anxious with mobility.  She completes ADLs with up to min guard assist, transfers and mobility using RW with min guard assist.  Has good support of daughter at home.  Will follow acutely and recommend continue HHOT services at dc to optimize return to PLOF with ADLs and IADLs.  ?   ? ?Recommendations for follow up therapy are one component of a multi-disciplinary discharge planning process, led by the attending physician.  Recommendations may be updated based on patient status, additional functional criteria and insurance authorization.  ? ?Follow Up Recommendations ? Home health OT  ?  ?Assistance Recommended at Discharge Intermittent Supervision/Assistance  ?Patient can return home with the following A little help with walking and/or transfers;A little help with bathing/dressing/bathroom;Assistance with cooking/housework;Direct supervision/assist for medications management;Direct supervision/assist for financial management;Assist for transportation;Help with stairs or ramp for entrance ? ?  ?Functional Status Assessment ? Patient has had a recent decline in their functional status and demonstrates the ability to make significant improvements in function in  a reasonable and predictable amount of time.  ?Equipment Recommendations ? None recommended by OT  ?  ?Recommendations for Other Services   ? ? ?  ?Precautions / Restrictions Precautions ?Precautions: Fall ?Restrictions ?Weight Bearing Restrictions: No  ? ?  ? ?Mobility Bed Mobility ?Overal bed mobility: Modified Independent ?  ?  ?  ?  ?  ?  ?General bed mobility comments: HOB elevated ?  ? ?Transfers ?  ?  ?  ?  ?  ?  ?  ?  ?  ?  ?  ? ?  ?Balance Overall balance assessment: Needs assistance ?Sitting-balance support: No upper extremity supported, Feet supported ?Sitting balance-Leahy Scale: Good ?  ?  ?Standing balance support: No upper extremity supported, During functional activity, Bilateral upper extremity supported ?Standing balance-Leahy Scale: Fair ?Standing balance comment: relies on RW, able to engage in ADLs without UE supportr ?  ?  ?  ?  ?  ?  ?  ?  ?  ?  ?  ?   ? ?ADL either performed or assessed with clinical judgement  ? ?ADL Overall ADL's : Needs assistance/impaired ?  ?  ?Grooming: Min guard;Standing ?  ?  ?  ?  ?  ?Upper Body Dressing : Set up;Sitting ?  ?Lower Body Dressing: Min guard;Sit to/from stand ?  ?Toilet Transfer: Min guard;Ambulation;Rolling walker (2 wheels) ?  ?  ?  ?  ?  ?Functional mobility during ADLs: Min guard;Rolling walker (2 wheels) ?   ? ? ? ?Vision   ?Vision Assessment?: No apparent visual deficits  ?   ?Perception   ?  ?Praxis   ?  ? ?Pertinent Vitals/Pain Pain Assessment ?Pain Assessment: No/denies pain  ? ? ? ?Hand Dominance Right ?  ?Extremity/Trunk Assessment Upper Extremity Assessment ?Upper Extremity Assessment: Generalized  weakness ?  ?Lower Extremity Assessment ?Lower Extremity Assessment: Defer to PT evaluation ?  ?  ?  ?Communication Communication ?Communication: No difficulties ?  ?Cognition Arousal/Alertness: Awake/alert ?Behavior During Therapy: Anxious ?Overall Cognitive Status: Impaired/Different from baseline ?Area of Impairment: Memory, Awareness, Problem  solving ?  ?  ?  ?  ?  ?  ?  ?  ?  ?  ?Memory: Decreased short-term memory ?  ?  ?Awareness: Emergent ?Problem Solving: Slow processing, Requires verbal cues ?General Comments: pt with decreased STM, reports this is baseline.  Follows commands and engages appropriately.  Slow processing and anxious with mobility. ?  ?  ?General Comments  daughter present at end of session ? ?  ?Exercises   ?  ?Shoulder Instructions    ? ? ?Home Living Family/patient expects to be discharged to:: Private residence ?Living Arrangements: Children (daughter) ?Available Help at Discharge: Family;Available 24 hours/day ?Type of Home: House ?Home Access: Ramped entrance ?  ?  ?Home Layout: One level ?  ?  ?Bathroom Shower/Tub: Tub/shower unit ?  ?Bathroom Toilet: Handicapped height ?  ?  ?Home Equipment: Kasandra Knudsen - quad;Cane - single point;BSC/3in1;Shower seat;Wheelchair - manual;Grab bars - tub/shower;Grab bars - toilet;Rolling Walker (2 wheels) ?  ?  ?  ? ?  ?Prior Functioning/Environment Prior Level of Function : Independent/Modified Independent ?  ?  ?  ?  ?  ?  ?Mobility Comments: normally uses SPC for mobility ?ADLs Comments: independent ADLs, limited IADLs (daughter drives) ?  ? ?  ?  ?OT Problem List: Decreased strength;Decreased activity tolerance;Impaired balance (sitting and/or standing);Decreased cognition;Decreased safety awareness;Decreased knowledge of use of DME or AE;Decreased knowledge of precautions ?  ?   ?OT Treatment/Interventions: Self-care/ADL training;DME and/or AE instruction;Therapeutic activities;Cognitive remediation/compensation;Patient/family education;Balance training;Therapeutic exercise  ?  ?OT Goals(Current goals can be found in the care plan section) Acute Rehab OT Goals ?Patient Stated Goal: home ?OT Goal Formulation: With patient ?Time For Goal Achievement: 12/29/21 ?Potential to Achieve Goals: Good  ?OT Frequency: Min 2X/week ?  ? ?Co-evaluation   ?  ?  ?  ?  ? ?  ?AM-PAC OT "6 Clicks" Daily Activity      ?Outcome Measure Help from another person eating meals?: Total (NPO) ?Help from another person taking care of personal grooming?: A Little ?Help from another person toileting, which includes using toliet, bedpan, or urinal?: A Little ?Help from another person bathing (including washing, rinsing, drying)?: A Little ?Help from another person to put on and taking off regular upper body clothing?: A Little ?Help from another person to put on and taking off regular lower body clothing?: A Little ?6 Click Score: 16 ?  ?End of Session Equipment Utilized During Treatment: Gait belt;Rolling walker (2 wheels) ?Nurse Communication: Mobility status ? ?Activity Tolerance: Patient tolerated treatment well ?Patient left: in bed;with call bell/phone within reach;with bed alarm set;with family/visitor present ? ?OT Visit Diagnosis: Other abnormalities of gait and mobility (R26.89);Muscle weakness (generalized) (M62.81)  ?              ?Time: 2355-7322 ?OT Time Calculation (min): 31 min ?Charges:  OT General Charges ?$OT Visit: 1 Visit ?OT Evaluation ?$OT Eval Moderate Complexity: 1 Mod ?OT Treatments ?$Self Care/Home Management : 8-22 mins ? ?Jolaine Artist, OT ?Acute Rehabilitation Services ?Pager [phone] ?Office [phone] ? ? ?Delight Stare ?12/15/2021, 12:05 PM ?

## 2021-12-15 NOTE — Assessment & Plan Note (Signed)
Followed by Oncology

## 2021-12-15 NOTE — Assessment & Plan Note (Signed)
Chronic allow permissive HTN  ?

## 2021-12-15 NOTE — Assessment & Plan Note (Addendum)
Followed by oncology pt states she did not wish to have any more chemotherapy ?CXR Is stable ?

## 2021-12-15 NOTE — TOC Initial Note (Signed)
Transition of Care (TOC) - Initial/Assessment Note  ? ? ?Patient Details  ?Name: Cynthia [name] ?MRN: [id number] ?Date of Birth: May 27, 1936 ? ?Transition of Care (TOC) CM/SW Contact:    ?Pollie Friar, RN ?Phone Number: ?12/15/2021, 3:31 PM ? ?Clinical Narrative:                 ?Patient is from home with her daughter who is able to provide needed supervision.  ?[place] states her daughter does the driving. She manages her own medications and denies any issues.  ?OT recommending [facility] but patient prefers to attend outpatient rehab. She has gone to [facility] before and asked to attend there again. Information on the AVS.  ?TOC following. ? ?Expected Discharge Plan: OP Rehab ?Barriers to Discharge: Continued Medical Work up ? ? ?Patient Goals and CMS Choice ?  ?  ?Choice offered to / list presented to : Patient ? ?Expected Discharge Plan and Services ?Expected Discharge Plan: OP Rehab ?  ?Discharge Planning Services: CM Consult ?  ?Living arrangements for the past 2 months: [place] ?                ?  ?  ?  ?  ?  ?  ?  ?  ?  ?  ? ?Prior Living Arrangements/Services ?Living arrangements for the past 2 months: [place] ?Lives with:: Adult Children ?Patient language and need for interpreter reviewed:: Yes ?Do you feel safe going back to the place where you live?: Yes      ?Need for Family Participation in Patient Care: Yes (Comment) ?Care giver support system in place?: Yes (comment) ?Current home services: DME (wheelchair/ walker/ cane/ shower seat) ?Criminal Activity/Legal Involvement Pertinent to Current Situation/Hospitalization: No - Comment as needed ? ?Activities of Daily Living ?  ?  ? ?Permission Sought/Granted ?  ?  ?   ?   ?   ?   ? ?Emotional Assessment ?Appearance:: Appears stated age ?Attitude/Demeanor/Rapport: Engaged ?Affect (typically observed): Accepting ?Orientation: : Oriented to Self, Oriented to Place, Oriented to  Time, Oriented to Situation ?  ?Psych Involvement: No  (comment) ? ?Admission diagnosis:  Stroke [facility]) [I63.9] ?Acute CVA (cerebrovascular accident) ([place]) [I63.9] ?Weakness of right foot [R29.898] ?Patient Active Problem List  ? Diagnosis Date Noted  ? Pulmonary nodules 12/15/2021  ? Stroke [facility]) 12/14/2021  ? Port-A-Cath in place 08/13/2019  ? Goals of care, counseling/discussion 07/22/2019  ? Clawfoot, acquired, right 10/31/2016  ? Poor appetite   ? Neurogenic bladder   ? Adjustment disorder with mixed anxiety and depressed mood   ? Quadriplegia and quadriparesis ([place])   ? Numbness and tingling   ? Paresthesias   ? UTI (lower urinary tract infection)   ? History of colon cancer   ? Acute blood loss anemia   ? Upper GI bleed   ? GBS (Guillain-Barre syndrome) ([place])   ? Aspiration into airway   ? Endotracheally intubated   ? Acute respiratory failure ([place])   ? Shock ([place]) 10/09/2015  ? Hemoptysis   ? Transverse myelitis ([place])   ? Peripheral neuropathy 10/06/2015  ? Paresthesia of both hands 10/06/2015  ? Paresthesia of both feet 10/06/2015  ? Cerebral embolism with cerebral infarction 10/06/2015  ? Ataxia   ? Colon carcinoma metastatic to liver ([place]) 09/25/2015  ? Metastasis to liver ([place]) 08/28/2015  ? Colon cancer metastasized to liver [facility])   ? Liver lesion   ? Cerebral infarction due to unspecified mechanism 01/29/2015  ?  B12 deficiency 01/29/2015  ? Essential hypertension 01/29/2015  ? Arm numbness left   ? Disorientation   ? Numbness and tingling of left side of face   ? TIA (transient ischemic attack) 01/11/2015  ? Confusion   ? Left arm numbness   ? DVT, lower extremity, distal ([place]) 10/27/2013  ? T4a, N0 09/27/2013  ? Iron deficiency anemia 09/04/2013  ? Symptomatic anemia 09/03/2013  ? Orthostasis 09/03/2013  ? Near syncope 09/03/2013  ? ?PCP:  Jonathon Jordan, MD ?Pharmacy:   ?[facility] [place] ?[place] ?[place] [address] ?Phone: [phone] Fax:  [phone] ? ?[facility] ?[address] ?[place] [address] ?Phone: [phone] Fax: [phone] ? ? ? ? ?Social Determinants of Health (SDOH) Interventions ?  ? ?Readmission Risk Interventions ?   ? View : No data to display.  ?  ?  ?  ? ? ? ?

## 2021-12-15 NOTE — ED Notes (Signed)
Called floor to initate purple man process. Bed is assigned on track board, but still no purple man is present at this tiem ?

## 2021-12-16 ENCOUNTER — Encounter: Payer: Self-pay | Admitting: *Deleted

## 2021-12-16 ENCOUNTER — Other Ambulatory Visit: Payer: Self-pay | Admitting: Student

## 2021-12-16 ENCOUNTER — Encounter: Payer: Self-pay | Admitting: Neurology

## 2021-12-16 DIAGNOSIS — R918 Other nonspecific abnormal finding of lung field: Secondary | ICD-10-CM | POA: Diagnosis not present

## 2021-12-16 DIAGNOSIS — I10 Essential (primary) hypertension: Secondary | ICD-10-CM | POA: Diagnosis not present

## 2021-12-16 DIAGNOSIS — R000 Tachycardia, unspecified: Secondary | ICD-10-CM

## 2021-12-16 DIAGNOSIS — C189 Malignant neoplasm of colon, unspecified: Secondary | ICD-10-CM | POA: Diagnosis not present

## 2021-12-16 DIAGNOSIS — C787 Secondary malignant neoplasm of liver and intrahepatic bile duct: Secondary | ICD-10-CM | POA: Diagnosis not present

## 2021-12-16 DIAGNOSIS — I639 Cerebral infarction, unspecified: Secondary | ICD-10-CM

## 2021-12-16 DIAGNOSIS — I63312 Cerebral infarction due to thrombosis of left middle cerebral artery: Secondary | ICD-10-CM

## 2021-12-16 MED ORDER — CLOPIDOGREL BISULFATE 75 MG PO TABS: 75.0000 mg | ORAL_TABLET | Freq: Every day | ORAL | 0 refills | Status: DC

## 2021-12-16 MED ORDER — ASPIRIN 81 MG PO TBEC: 81.0000 mg | DELAYED_RELEASE_TABLET | Freq: Every day | ORAL | 11 refills | Status: AC

## 2021-12-16 MED ORDER — ATORVASTATIN CALCIUM 40 MG PO TABS: 40.0000 mg | ORAL_TABLET | Freq: Every day | ORAL | 0 refills | Status: DC

## 2021-12-16 NOTE — Progress Notes (Signed)
Went over D/C instruction with pt and her daughter. All questions answered. Pt wheeled off unit by volunteer.  ?

## 2021-12-16 NOTE — Progress Notes (Signed)
Physical Therapy Treatment ?Patient Details ?Name: Cynthia [name] ?MRN: [id number] ?DOB: Jan 18, 1936 ?Today's Date: 12/16/2021 ? ? ?History of Present Illness 86 y/o female presented to [facility] ED on 12/14/21 for R LE numbness. MRI showed small acute infarcts of L caudate tail and anterior L centrum semiovale. Transferred to [facility]. PMH: hx of Guillian Barre, hx of CVA, hx of colon cancer s/p colectomy with hepatic metastasis, pulmonary nodules. ? ?  ?PT Comments  ? ? Pt progressing well towards all goals. Pt with improved ambulation tolerance and stability today compared to yesterday. Pt remains to have STM deficits posing safety deficits with transfers however dtr aware and provides 24/7 assist. Acute PT to cont to follow. ?   ?Recommendations for follow up therapy are one component of a multi-disciplinary discharge planning process, led by the attending physician.  Recommendations may be updated based on patient status, additional functional criteria and insurance authorization. ? ?Follow Up Recommendations ? Outpatient PT ?  ?  ?Assistance Recommended at Discharge Set up Supervision/Assistance  ?Patient can return home with the following   ?  ?Equipment Recommendations ? None recommended by PT (pt has RW at home)  ?  ?Recommendations for Other Services   ? ? ?  ?Precautions / Restrictions Precautions ?Precautions: Fall ?Restrictions ?Weight Bearing Restrictions: No  ?  ? ?Mobility ? Bed Mobility ?Overal bed mobility: Modified Independent ?  ?  ?  ?  ?  ?  ?General bed mobility comments: HOB elevated ?  ? ?Transfers ?Overall transfer level: Needs assistance ?Equipment used: Rolling walker (2 wheels) ?Transfers: Sit to/from Stand ?Sit to Stand: Supervision ?  ?  ?  ?  ?  ?General transfer comment: pt requiring constant verbal cues for safe hand placement to push up from bed/chair and reach back, NOT pull up on walker or leave hands on walker when sitting. Pt with no carry over t/o session ?   ? ?Ambulation/Gait ?Ambulation/Gait assistance: Min guard ?Gait Distance (Feet): 130 Feet ?Assistive device: Rolling walker (2 wheels) ?Gait Pattern/deviations: Step-through pattern, Decreased stride length, Decreased stance time - right ?Gait velocity: decreased ?Gait velocity interpretation: 1.31 - 2.62 ft/sec, indicative of limited community ambulator ?  ?General Gait Details: min guard, pt with less UE dependency today compared to yesterday, pt able to report to therapist of progressive weakness/fatigue within L LE ? ? ?Stairs ?  ?  ?  ?  ?  ? ? ?Wheelchair Mobility ?  ? ?Modified Rankin (Stroke Patients Only) ?Modified Rankin (Stroke Patients Only) ?Pre-Morbid Rankin Score: Slight disability ?Modified Rankin: Moderate disability ? ? ?  ?Balance Overall balance assessment: Needs assistance ?Sitting-balance support: No upper extremity supported, Feet supported ?Sitting balance-Leahy Scale: Good ?  ?  ?Standing balance support: Bilateral upper extremity supported, Reliant on assistive device for balance ?Standing balance-Leahy Scale: Fair ?Standing balance comment: pt requires external support for dynamic standing but able to maintain balance witout UE support statically ?  ?  ?  ?  ?  ?  ?  ?  ?  ?  ?  ?  ? ?  ?Cognition Arousal/Alertness: Awake/alert ?Behavior During Therapy: Impulsive (due to [facility] memory deficits, pt quick to move) ?Overall Cognitive Status: Impaired/Different from baseline ?Area of Impairment: Memory, Awareness, Problem solving ?  ?  ?  ?  ?  ?  ?  ?  ?  ?  ?Memory: Decreased short-term memory ?  ?  ?Awareness: Emergent ?Problem Solving: Slow processing, Requires verbal cues ?General Comments: pt with decreased  STM, dtra reports this is baseline.  Follows commands and engages appropriately. perseverates ?  ?  ? ?  ?Exercises   ? ?  ?General Comments General comments (skin integrity, edema, etc.): daughter present, pt assisted to bathroom, pt indep with hygiene, verbal cues to wash hands after,  pt able to wash hands withnout leaning on sink ?  ?  ? ?Pertinent Vitals/Pain Pain Assessment ?Pain Assessment: No/denies pain  ? ? ?Home Living   ?  ?  ?  ?  ?  ?  ?  ?  ?  ?   ?  ?Prior Function    ?  ?  ?   ? ?PT Goals (current goals can now be found in the care plan section) Acute Rehab PT Goals ?Patient Stated Goal: to go home today ?PT Goal Formulation: With patient ?Time For Goal Achievement: 12/29/21 ?Potential to Achieve Goals: Good ?Progress towards PT goals: Progressing toward goals ? ?  ?Frequency ? ? ? Min 3X/week ? ? ? ?  ?PT Plan Current plan remains appropriate  ? ? ?Co-evaluation   ?  ?  ?  ?  ? ?  ?AM-PAC PT "6 Clicks" Mobility   ?Outcome Measure ? Help needed turning from your back to your side while in a flat bed without using bedrails?: None ?Help needed moving from lying on your back to sitting on the side of a flat bed without using bedrails?: None ?Help needed moving to and from a bed to a chair (including a wheelchair)?: A Little ?Help needed standing up from a chair using your arms (e.g., wheelchair or bedside chair)?: A Little ?Help needed to walk in hospital room?: A Little ?Help needed climbing 3-5 steps with a railing? : A Little ?6 Click Score: 20 ? ?  ?End of Session Equipment Utilized During Treatment: Gait belt ?Activity Tolerance: Patient tolerated treatment well ?Patient left: with call bell/phone within reach;in chair;with chair alarm set;with family/visitor present ?Nurse Communication: Mobility status ?PT Visit Diagnosis: Unsteadiness on feet (R26.81);Muscle weakness (generalized) (M62.81) ?  ? ? ?Time: 6222-9798 ?PT Time Calculation (min) (ACUTE ONLY): 28 min ? ?Charges:  $Gait Training: 23-37 mins          ?          ? ?Kittie Plater, PT, DPT ?Acute Rehabilitation Services ?Secure chat preferred ?Office #: [phone] ? ? ? ?Braxen Dobek M Octavio Matheney ?12/16/2021, 11:41 AM ? ?

## 2021-12-16 NOTE — Progress Notes (Signed)
30 day event for post CVA monitoring - h/o tachycardia ?

## 2021-12-16 NOTE — Progress Notes (Signed)
?  30 day monitor ordered at primary teams request.  ? ?CVA felt to be due to small vessel disease, but given 2 separate strokes, neurology recommended 30 day monitor.   ? ?Follow up scheduled post monitor to discuss results and consider loop recorder.  ? ?Lollie Marrow, PA-C  ?12/16/2021 12:59 PM  ?

## 2021-12-16 NOTE — Discharge Summary (Signed)
?Physician Discharge Summary ?  ?Patient: Cynthia [name] MRN: [id number] DOB: 02/21/1936  ?Admit date:     12/14/2021  ?Discharge date: 12/16/21  ?Discharge Physician: Valeria Batman Mardell Cragg  ? ?PCP: Jonathon Jordan, MD  ? ?Recommendations at discharge:  ?Follow-up with PCP-cardiologist: Neurologyrecommend 30-day CardioNet monitor as outpatient to rule out A-fib ?aspirin 81 mg daily and clopidogrel 75 mg daily for 3 weeks and then aspirin alone..  ?-Continue statins, atorvastatin 40 mg p.o. daily, checking LFTs, BMP in 3-6 weeks ? ?Discharge Diagnoses: ?Principal Problem: ?  Stroke [facility]) ?Active Problems: ?  Essential hypertension ?  GBS (Guillain-Barre syndrome) ([place]) ?  Colon cancer metastasized to liver [facility]) ?  Pulmonary nodules ? ?Resolved Problems: ?  * No resolved hospital problems. * ? ? ?Hospital course: ?CAMDYN LADEN is a 85/M with history of metastatic colon cancer, no longer on active treatment chronic anemia, history of Guillain-Barr? syndrome, hypertension, prior stroke presented with acute onset right lower extremity weakness. ?-MRI noted acute CVA, neurology consulting ?  ?  ?  ?Acute ischemic stroke ?-MRI positive for acute left centrum semiovale stroke and left caudate tail ?-Neurology consulting, started on aspirin 81 mg, Plavix 75 mg for 21 days then continue with aspirin alone ?-PT eval pending ?-Echo with preserved EF, grade 1 diastolic dysfunction, carotid duplex and MRA pending ?-LDL is 141, HbA1c is 5.7 ?-Neurologist recommended loop recorder for 30 days ? ?  ?Dyslipidemia ?-LDL 141 as noted above, started on statin, atorvastatin 40 mg daily ?  ?Metastatic colon cancer ?-With liver and pulmonary mets ?-Prior history of colon resection, followed by chemo and XRT ?-No longer under treatment, followed by Dr. Benay Spice ?  ?Chronic anemia ?-Secondary to above, mild ?  ?  ?  ?Code status: Discussed CODE STATUS with the patient , she wishes to be a DNR ? ?Plan of care discharge to be discussed  with the patient and daughter at bedside. ? ? ?Consultants: Neurology ?Procedures performed: Multiple imaging CT MRI of the brain ?Disposition: Home ?Diet recommendation:  ?Discharge Diet Orders (From admission, onward)  ? ?  Start     Ordered  ? 12/16/21 0000  Diet - low sodium heart healthy       ? 12/16/21 1251  ? ?  ?  ? ?  ? ?Cardiac diet ?DISCHARGE MEDICATION: ?Allergies as of 12/16/2021   ? ?   Reactions  ? Morphine And Related Other (See Comments)  ? Patient has hallucinations  ? ?  ? ?  ?Medication List  ?  ? ?STOP taking these medications   ? ?prochlorperazine 5 MG tablet ?Commonly known as: COMPAZINE ?  ? ?  ? ?TAKE these medications   ? ?acetaminophen 500 MG tablet ?Commonly known as: TYLENOL ?Take 1,000 mg by mouth every 6 (six) hours as needed for mild pain. ?  ?aspirin 81 MG EC tablet ?Take 1 tablet (81 mg total) by mouth daily. Swallow whole. ?Start taking on: Dec 17, 2021 ?  ?atorvastatin 40 MG tablet ?Commonly known as: LIPITOR ?Take 1 tablet (40 mg total) by mouth daily. ?Start taking on: Dec 17, 2021 ?  ?clopidogrel 75 MG tablet ?Commonly known as: PLAVIX ?Take 1 tablet (75 mg total) by mouth daily for 21 days. ?Start taking on: Dec 17, 2021 ?  ?gabapentin 400 MG capsule ?Commonly known as: NEURONTIN ?TAKE 1 CAPSULE BY MOUTH THREE TIMES DAILY. MAY TAKE ADDITIONAL DOSE AT NIGHT AS NEEDED ?  ?lidocaine-prilocaine cream ?Commonly known as: EMLA ?Apply 1 application topically  as directed. Apply to port site 1 hour prior to stick and cover with plastic wrap ?  ?loperamide 2 MG capsule ?Commonly known as: IMODIUM ?Take 2-4 mg by mouth as needed for diarrhea or loose stools. ?  ?metoprolol succinate 50 MG 24 hr tablet ?Commonly known as: TOPROL-XL ?Take 100 mg by mouth daily. Take with or immediately following a meal. ?  ?vitamin B-12 1000 MCG tablet ?Commonly known as: CYANOCOBALAMIN ?1 tablet ?  ?Vitamin D3 25 MCG (1000 UT) Caps ?1 capsule ?  ? ?  ? ? Follow-up Information   ? ? [place]. Schedule an appointment as soon as possible for a visit in 1 week(s).   ?Specialty: Rehabilitation ?Contact information: ?[place] ?[id number] ?[place] ?[phone] ? ?  ?  ? ? Pieter Partridge, DO. Schedule an appointment as soon as possible for a visit in 1 month(s).   ?Specialty: Neurology ?Contact information: ?[address] ?[address] ?[place] [address] ?[phone] ? ? ?  ?  ? ?  ?  ? ?  ? ?Discharge Exam: ?Filed Weights  ? 12/14/21 1205  ?Weight: 54.8 kg  ? ? ? ? ?Physical Exam: ?  ?General:  AAO x 3,  cooperative, no distress;   ?HEENT:  Normocephalic, PERRL, otherwise with in Normal limits   ?Neuro:  CNII-XII intact. , normal motor and sensation, reflexes intact   ?Lungs:   Clear to auscultation BL, Respirations unlabored,  ?No wheezes / crackles  ?Cardio:    S1/S2, RRR, No murmure, No Rubs or Gallops   ?Abdomen:  Soft, non-tender, bowel sounds active all four quadrants, ?no guarding or peritoneal signs.  ?Muscular  ?skeletal:  Limited exam -global generalized weaknesses ?- in bed, able to move all 4 extremities,   ?2+ pulses,  symmetric, No pitting edema  ?Skin:  Dry, warm to touch, negative for any Rashes,  ?Wounds: Please see nursing documentation ?   ? ? ?  ? ? ?Condition at discharge: stable ? ?The results of significant diagnostics from this hospitalization (including imaging, microbiology, ancillary and laboratory) are listed below for reference.  ? ?Imaging Studies: ?CT Head Wo Contrast ? ?Result Date: 12/14/2021 ?CLINICAL DATA:  Right lower leg numbness since Sunday. History of cancer. EXAM: CT HEAD WITHOUT CONTRAST TECHNIQUE: Contiguous axial images were obtained from the base of the skull through the vertex without intravenous contrast. RADIATION DOSE REDUCTION: This exam was performed according to the departmental dose-optimization program which includes automated exposure control, adjustment of the mA and/or kV  according to patient size and/or use of iterative reconstruction technique. COMPARISON:  CT head dated October 24, 2015. FINDINGS: Brain: No evidence of acute infarction, hemorrhage, hydrocephalus, extra-axial collection or mass lesion/mass effect. Stable mild atrophy and chronic microvascular ischemic changes. Vascular: Atherosclerotic vascular calcification of the carotid siphons. No hyperdense vessel. Skull: Normal. Negative for fracture or focal lesion. Sinuses/Orbits: No acute finding. Other: None. IMPRESSION: 1. No acute intracranial abnormality. Electronically Signed   By: Titus Dubin M.D.   On: 12/14/2021 12:54  ? ?MR ANGIO HEAD WO CONTRAST ? ?Result Date: 12/15/2021 ?CLINICAL DATA:  Stroke EXAM: MRA HEAD WITHOUT CONTRAST TECHNIQUE: Angiographic images of the Circle of Willis were acquired using MRA technique without intravenous contrast. COMPARISON:  MRI head 12/14/2021 FINDINGS: Anterior circulation: Internal carotid artery widely patent through the cavernous segment without stenosis. Anterior and middle cerebral arteries patent. No stenosis or large vessel occlusion. Posterior circulation: Right vertebral artery  dominant widely patent. Mild stenosis distal left tibial artery. PICA, AICA, superior cerebellar, posterior cerebral arteries patent bilaterally without stenosis or large vessel occlusion Anatomic variants: Negative for cerebral aneurysm Other: None. IMPRESSION: Mild stenosis distal left vertebral artery. No intracranial stenosis or large vessel occlusion. Electronically Signed   By: Franchot Gallo M.D.   On: 12/15/2021 14:58  ? ?MR BRAIN WO CONTRAST ? ?Result Date: 12/14/2021 ?CLINICAL DATA:  Transient ischemic attack EXAM: MRI HEAD WITHOUT CONTRAST TECHNIQUE: Multiplanar, multiecho pulse sequences of the brain and surrounding structures were obtained without intravenous contrast. COMPARISON:  10/06/2015 FINDINGS: Brain: Small acute infarcts of the left caudate tail and anterior left centrum  semiovale. No acute or chronic hemorrhage. There is multifocal hyperintense T2-weighted signal within the white matter. Generalized cerebral volume loss. The midline structures are normal. Vascular: Major flow v

## 2021-12-16 NOTE — Evaluation (Signed)
Occupational Therapy Evaluation ?Patient Details ?Name: Cynthia [name] ?MRN: [id number] ?DOB: 1936/06/01 ?Today's Date: 12/16/2021 ? ? ?History of Present Illness 86 y/o female presented to [facility] ED on 12/14/21 for R LE numbness. MRI showed small acute infarcts of L caudate tail and anterior L centrum semiovale. Transferred to [facility]. PMH: hx of Guillian Barre, hx of CVA, hx of colon cancer s/p colectomy with hepatic metastasis, pulmonary nodules.  ? ?Clinical Impression ?  ?Patient progressing well towards OT goals.  Completing transfers and mobility using RW with supervision, LB ADLs, toilet transfers and grooming with supervision. Demonstrating difficulty with dual cognitive tasks in hallway requiring min cueing for sequencing, safety and recall.  Daughter plans to assist with med mgmt at dc. Updated dc plan to outpt OT. Will follow.  ?   ? ?Recommendations for follow up therapy are one component of a multi-disciplinary discharge planning process, led by the attending physician.  Recommendations may be updated based on patient status, additional functional criteria and insurance authorization.  ? ?Follow Up Recommendations ? Outpatient OT  ?  ?Assistance Recommended at Discharge Intermittent Supervision/Assistance  ?Patient can return home with the following A little help with walking and/or transfers;A little help with bathing/dressing/bathroom;Assistance with cooking/housework;Direct supervision/assist for medications management;Assist for transportation;Direct supervision/assist for financial management ? ?  ?Functional Status Assessment ?    ?Equipment Recommendations ? None recommended by OT  ?  ?Recommendations for Other Services   ? ? ?  ?Precautions / Restrictions Precautions ?Precautions: Fall ?Restrictions ?Weight Bearing Restrictions: No  ? ?  ? ?Mobility Bed Mobility ?  ?  ?  ?  ?  ?  ?  ?General bed mobility comments: OOB upon entry ?  ? ?Transfers ?Overall transfer level: Needs  assistance ?Equipment used: Rolling walker (2 wheels) ?Transfers: Sit to/from Stand ?Sit to Stand: Supervision ?  ?  ?  ?  ?  ?General transfer comment: good recall of hand placement ?  ? ?  ?Balance Overall balance assessment: Needs assistance ?Sitting-balance support: No upper extremity supported, Feet supported ?Sitting balance-Leahy Scale: Good ?  ?  ?Standing balance support: Bilateral upper extremity supported, During functional activity, No upper extremity supported ?Standing balance-Leahy Scale: Fair ?Standing balance comment: relies on RW ?  ?  ?  ?  ?  ?  ?  ?  ?  ?  ?  ?   ? ?ADL either performed or assessed with clinical judgement  ? ?ADL Overall ADL's : Needs assistance/impaired ?  ?  ?Grooming: Supervision/safety;Standing ?  ?  ?  ?  ?  ?  ?  ?Lower Body Dressing: Supervision/safety;Sit to/from stand ?  ?Toilet Transfer: Supervision/safety;Ambulation;Rolling walker (2 wheels);Grab bars ?  ?Toileting- Clothing Manipulation and Hygiene: Supervision/safety;Sit to/from stand ?  ?  ?  ?Functional mobility during ADLs: Supervision/safety;Rolling walker (2 wheels) ?   ? ? ? ?Vision   ?   ?   ?Perception   ?  ?Praxis   ?  ? ?Pertinent Vitals/Pain Pain Assessment ?Pain Assessment: No/denies pain  ? ? ? ?Hand Dominance   ?  ?Extremity/Trunk Assessment   ?  ?  ?  ?  ?  ?Communication   ?  ?Cognition Arousal/Alertness: Awake/alert ?Behavior During Therapy: [facility] for tasks assessed/performed ?Overall Cognitive Status: Impaired/Different from baseline ?Area of Impairment: Memory, Awareness, Problem solving ?  ?  ?  ?  ?  ?  ?  ?  ?  ?  ?Memory: Decreased short-term memory ?  ?  ?Awareness:  Emergent ?Problem Solving: Slow processing, Requires verbal cues ?General Comments: pt with decreased STM, difficutly with dual cognitive tasking requiring min cueing ?  ?  ?General Comments  daughter present ? ?  ?Exercises   ?  ?Shoulder Instructions    ? ? ?Home Living   ?  ?  ?  ?  ?  ?  ?  ?  ?  ?  ?  ?  ?  ?  ?  ?  ?  ?  ? ?   ?Prior Functioning/Environment   ?  ?  ?  ?  ?  ?  ?  ?  ?  ? ?  ?  ?OT Problem List:   ?  ?   ?OT Treatment/Interventions:    ?  ?OT Goals(Current goals can be found in the care plan section) Acute Rehab OT Goals ?Patient Stated Goal: home ?OT Goal Formulation: With patient ?Time For Goal Achievement: 12/29/21 ?Potential to Achieve Goals: Good  ?OT Frequency: Min 2X/week ?  ? ?Co-evaluation   ?  ?  ?  ?  ? ?  ?AM-PAC OT "6 Clicks" Daily Activity     ?Outcome Measure Help from another person eating meals?: A Little ?Help from another person taking care of personal grooming?: A Little ?Help from another person toileting, which includes using toliet, bedpan, or urinal?: A Little ?Help from another person bathing (including washing, rinsing, drying)?: A Little ?Help from another person to put on and taking off regular upper body clothing?: A Little ?Help from another person to put on and taking off regular lower body clothing?: A Little ?6 Click Score: 18 ?  ?End of Session Equipment Utilized During Treatment: Rolling walker (2 wheels) ?Nurse Communication: Mobility status ? ?Activity Tolerance: Patient tolerated treatment well ?Patient left: in chair;with call bell/phone within reach;with family/visitor present ? ?OT Visit Diagnosis: Other abnormalities of gait and mobility (R26.89);Muscle weakness (generalized) (M62.81)  ?              ?Time: 4128-7867 ?OT Time Calculation (min): 19 min ?Charges:  OT General Charges ?$OT Visit: 1 Visit ?OT Treatments ?$Self Care/Home Management : 8-22 mins ? ?Jolaine Artist, OT ?Acute Rehabilitation Services ?Pager [phone] ?Office [phone] ? ? ?Delight Stare ?12/16/2021, 1:06 PM ?

## 2021-12-16 NOTE — TOC Transition Note (Signed)
Transition of Care (TOC) - CM/SW Discharge Note ? ? ?Patient Details  ?Name: Cynthia [name] ?MRN: [id number] ?Date of Birth: 11-Aug-1936 ? ?Transition of Care (TOC) CM/SW Contact:  ?Pollie Friar, RN ?Phone Number: ?12/16/2021, 12:56 PM ? ? ?Clinical Narrative:    ?Patient is discharging home with outpatient therapy through [facility]. Orders in Epic and information on the AVS.  ?Pt has transport home.  ? ? ?Final next level of care: OP Rehab ?Barriers to Discharge: No Barriers Identified ? ? ?Patient Goals and CMS Choice ?  ?  ?Choice offered to / list presented to : Patient ? ?Discharge Placement ?  ?           ?  ?  ?  ?  ? ?Discharge Plan and Services ?  ?Discharge Planning Services: CM Consult ?           ?  ?  ?  ?  ?  ?  ?  ?  ?  ?  ? ?Social Determinants of Health (SDOH) Interventions ?  ? ? ?Readmission Risk Interventions ?   ? View : No data to display.  ?  ?  ?  ? ? ? ? ? ?

## 2021-12-16 NOTE — Progress Notes (Signed)
Speech Language Pathology Treatment:    ?Patient Details ?Name: Cynthia [name] ?MRN: [id number] ?DOB: 25-Feb-1936 ?Today's Date: 12/16/2021 ?Time:  -  ?  ? ?Pt screened. Language-cognition and speech is within normal limits and full assessment not needed.  ?  ? ? ?Cynthia [name] ? ?12/16/2021, 11:35 AM ?

## 2021-12-20 NOTE — Therapy (Addendum)
?OUTPATIENT OCCUPATIONAL THERAPY NEURO EVALUATION ? ?Patient Name: Cynthia [name] ?MRN: [id number] ?DOB:23-Dec-1935, 86 y.o., female ?Today's Date: 12/21/2021 ? ?PCP: Dr. Jonathon Jordan ?REFERRING PROVIDER: Domenic Polite, MD; will follow up with  Dr. Metta Clines (neurology)   ? ? OT End of Session - 12/21/21 1447   ? ? Visit Number 1   ? Number of Visits 17   ? Date for OT Re-Evaluation 02/19/22   ? Authorization Type [employer]; prior auth required   ? Authorization - Visit Number 1   ? Authorization - Number of Visits 10   ? Progress Note Due on Visit 10   ? OT Start Time 1105   ? OT Stop Time 1145   ? OT Time Calculation (min) 40 min   ? Activity Tolerance Patient tolerated treatment well   ? Behavior During Therapy [facility] for tasks assessed/performed   ? ?  ?  ? ?  ? ? ?Past Medical History:  ?Diagnosis Date  ? Anemia   ? Blood transfusion without reported diagnosis jan 2015  ? 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 Cancer [facility])   ? liver  ? Cervical cancer ([place]) 1972  ? Colon cancer ([place]) JAN 2015  ? Colon cancer ([place])   ? Complication of anesthesia   ? slow to awaken in past, DONE WELL RECENTLY  ? DVT (deep venous thrombosis) ([place]) 10/24/13  ? Right leg  ? Guillain-Barre ([place]) 09/2015  ? Heart murmur   ? Hypertension   ? no bp meds   ? Liver cyst   ? Stroke [facility]) 12/2014  ? NUMBNESS ON LEFT SIDE  ? Tinnitus of both ears ALL THE TIME  ? ?Past Surgical History:  ?Procedure Laterality Date  ? ABDOMINAL HYSTERECTOMY    ? vaginal- partial  ? BACK SURGERY  1981  ? lower lumbar  ? ESOPHAGOGASTRODUODENOSCOPY N/A 10/14/2015  ? Procedure: ESOPHAGOGASTRODUODENOSCOPY (EGD);  Surgeon: Clarene Essex, MD;  Location: [facility] ENDOSCOPY;  Service: Endoscopy;  Laterality: N/A;  Bedside in ICU  ? IR CV LINE INJECTION  07/20/2020  ? IR GENERIC HISTORICAL  09/07/2016  ? IR RADIOLOGIST EVAL & MGMT 09/07/2016 Aletta Edouard, MD [facility] INTERV RAD  ? IR IMAGING GUIDED PORT INSERTION  07/29/2019  ? LAPAROSCOPIC RIGHT  COLECTOMY Right 10/22/2013  ? Procedure: LAPAROSCOPIC RIGHT COLECTOMY ;  Surgeon: [name], MD;  Location: [facility] ORS;  Service: General;  Laterality: Right;  ? SALPINGOOPHORECTOMY  10/22/2013  ? Procedure: BILATERALSALPINGO OOPHORECTOMY;  Surgeon: [name]. [name], MD;  Location: [facility] ORS;  Service: Gynecology;;  ? [place]  ? ?Patient Active Problem List  ? Diagnosis Date Noted  ? Pulmonary nodules 12/15/2021  ? Stroke [facility]) 12/14/2021  ? Port-A-Cath in place 08/13/2019  ? Goals of care, counseling/discussion 07/22/2019  ? [place], acquired, right 10/31/2016  ? Poor appetite   ? Neurogenic bladder   ? Adjustment disorder with mixed anxiety and depressed mood   ? Quadriplegia and quadriparesis ([place])   ? Numbness and tingling   ? Paresthesias   ? UTI (lower urinary tract infection)   ? History of colon cancer   ? Acute blood loss anemia   ? Upper GI bleed   ? GBS (Guillain-Barre syndrome) ([place])   ? Aspiration into airway   ? Endotracheally intubated   ? Acute respiratory failure ([place])   ? Shock ([place]) 10/09/2015  ? Hemoptysis   ? Transverse myelitis ([place])   ? Peripheral neuropathy 10/06/2015  ?  Paresthesia of both hands 10/06/2015  ? Paresthesia of both feet 10/06/2015  ? Cerebral embolism with cerebral infarction 10/06/2015  ? Ataxia   ? Colon carcinoma metastatic to liver ([place]) 09/25/2015  ? Metastasis to liver ([place]) 08/28/2015  ? Colon cancer metastasized to liver [facility])   ? Liver lesion   ? Cerebral infarction due to unspecified mechanism 01/29/2015  ? B12 deficiency 01/29/2015  ? Essential hypertension 01/29/2015  ? Arm numbness left   ? Disorientation   ? Numbness and tingling of left side of face   ? TIA (transient ischemic attack) 01/11/2015  ? Confusion   ? Left arm numbness   ? DVT, lower extremity, distal ([place]) 10/27/2013  ? T4a, N0 09/27/2013  ? Iron deficiency anemia 09/04/2013  ? Symptomatic anemia 09/03/2013  ? Orthostasis 09/03/2013  ? Near syncope 09/03/2013  ? ? ?ONSET DATE: 12/14/21   ? ?REFERRING DIAG: I63.9 (ICD-10-CM) - Acute CVA (cerebrovascular accident) ([place])  ? ?THERAPY DIAG:  ?Other lack of coordination ? ?Unsteadiness on feet ? ?Muscle weakness (generalized) ? ?SUBJECTIVE:  ? ?SUBJECTIVE STATEMENT: ?Pt reports that she has had PT in the past after prior stroke and GBS. ? ?Pt accompanied by: family member and daughter ? ?PERTINENT HISTORY: presented to ED with acute onset of RLE weakness.  MRI showed small acute infarcts of L caudate tail and anterior L centrum semiovale.  ? ?PMH also includes:  hx of Guillian Barre, hx of CVA  (L numbness 2016), hx of colon cancer s/p colectomy with hepatic metastasis, pulmonary nodules,  tinnitus of both ears, paripheral neuropathy ? ?PRECAUTIONS: Fall, 24 hr supervision, active CA (lung--monitoring) ? ?WEIGHT BEARING RESTRICTIONS No ? ?PAIN:  ?Are you having pain? No ? ?FALLS: Has patient fallen in last 6 months? No ? ?LIVING ENVIRONMENT: ?Lives with: lives with their family and daughter ?Lives in: House/apartment ?Stairs: No, ramp from outside ? ?PLOF: Independent with basic ADLs ? ?PATIENT GOALS be able to work in the yard ? ?OBJECTIVE:  ? ?HAND DOMINANCE: Right ? ?ADLs: ? ?Transfers/ambulation related to ADLs: ?Eating: mod I. Difficulty opening bottle of water, opening packages ?Grooming: mod I ?UB Dressing: needs set-up ?LB Dressing: needs assist getting shoes on (can't bend over due to decr balance), needs set-up, incr time for fasteners ?Toileting: close supervision for balance ?Bathing: mod I except transfer ?Tub Shower transfers: tub/shower combo, tub seat with back, grab bars, min A ?Equipment: Shower seat with back, Grab bars, Reacher, Long handled shoe horn, and Long handled sponge ? ? ?IADLs: ?Shopping: daughter performs currently; pt previously went with dtr ?Light housekeeping: independent prior, now daughter is performing; pt enjoys gardening (raking, planting)--unable to perform now ?Meal Prep: independent prior, now daughter is  performing ?Community mobility: did not drive prior ?Medication management: daughter performs  ?Handwriting:  pt reports difficulty with legibility and size  (wrote 1 sentence and address today with good legibility) ? ?MOBILITY STATUS: Needs Assist: ambulating with RW with close supervision now; ambulated with cane in community prior and no device in the home .  Dtr reports pt leans to the R, particularly when leaning over. ? ?ACTIVITY TOLERANCE: ?Activity tolerance: fatigues quickly ? ? ?UE ROM   BUEs grossly WNL ? ? ?UE MMT:   BUE proximal strength grossly 5/5 ? ?HAND FUNCTION: ?Grip strength: Right: 45.1 lbs; Left: 41.6 lbs ? ?COORDINATION: ?9 Hole Peg test: Right: 26.75 sec; Left: 23.07 sec ?Finger to Nose Test:  decr speed and accuracy with RUE ? ?SENSATION: ?Pt reports neuropathy in bilateral  feet and numbness in L side from prior stroke.  Pt denies changes to RUE ? ?COGNITION: ?Overall cognitive status: Impaired: Attention: Impaired: Sustained, Comment: per dtr/pt and Memory: Impaired: per dtr/pt .  "Only small things"--minor changes per pt/dtr report  ? ?VISION: ?Subjective report: WFL per pt report, pt denies changes ?Baseline vision: Wears glasses all the time ? ?TODAY'S TREATMENT:  ?N/a ? ? ?PATIENT EDUCATION: ?Education details: Eval results, POC ?Person educated: Patient and Child(ren) ?Education method: Explanation ?Education comprehension: verbalized understanding ? ? ?HOME EXERCISE PROGRAM: ?Not yet issued ? ? ? ?GOALS: ?Potential Goals reviewed with patient? Yes ? ?SHORT TERM GOALS: Target date: 01/18/2022 ? ?Pt will be independent with initial HEP. ? ?Goal status: INITIAL ? ?2.  Pt will perform BADLs mod I except shower transfer. ? ?Goal status: INITIAL ? ?3.  Pt will perform simple home maintenance task mod I. ? ?Goal status: INITIAL ? ?4.  Pt will improve R hand coordination for ADLs as shown improving  completing 9-hole peg test by at least 3sec. ?Baseline:  26.75sec ?Goal status:  INITIAL ? ? ? ?LONG TERM GOALS: Target date: 02/15/2022 ? ?Pt will be independent with updated HEP. ? ?Goal status: INITIAL ? ?2.  Pt will perform simple cooking task mod I. ? ?Goal status: INITIAL ? ?3.  Pt will perform shower transfer

## 2021-12-21 ENCOUNTER — Ambulatory Visit: Payer: Medicare PPO | Attending: Internal Medicine | Admitting: Occupational Therapy

## 2021-12-21 ENCOUNTER — Encounter: Payer: Self-pay | Admitting: Occupational Therapy

## 2021-12-21 DIAGNOSIS — I69351 Hemiplegia and hemiparesis following cerebral infarction affecting right dominant side: Secondary | ICD-10-CM | POA: Insufficient documentation

## 2021-12-21 DIAGNOSIS — R2681 Unsteadiness on feet: Secondary | ICD-10-CM | POA: Insufficient documentation

## 2021-12-21 DIAGNOSIS — M6281 Muscle weakness (generalized): Secondary | ICD-10-CM | POA: Diagnosis not present

## 2021-12-21 DIAGNOSIS — R2689 Other abnormalities of gait and mobility: Secondary | ICD-10-CM | POA: Insufficient documentation

## 2021-12-21 DIAGNOSIS — R278 Other lack of coordination: Secondary | ICD-10-CM | POA: Diagnosis not present

## 2021-12-22 ENCOUNTER — Telehealth: Payer: Self-pay | Admitting: Student

## 2021-12-22 NOTE — Telephone Encounter (Signed)
Patient's daughter called in very upset because she states she was assured by Oda Kilts, PA that the patient's monitor would arrive by Monday, 5/08. She stated she would come by the office to pick it up if she has to. I tried explaining to her that we do not send the monitors directly from the office, but she became more upset.  ? ?Is anyone able to advise on this? Can the monitor be tracked? Please assist as able. ?

## 2021-12-22 NOTE — Telephone Encounter (Signed)
Contacted [employer] explaining patients daughter calling in very upset because they had not received monitor. ?[employer] attempted to reach out to contact number on enrollment for shipping address verification.  There was no response. ?This information and [employer] contact information was included in letter sent to address on file on 12/16/21. ?Ovid Curd will check on delay and assured monitor would be out in todays shipment.  He will contact patients daughter with that information. ? ?

## 2021-12-23 ENCOUNTER — Ambulatory Visit: Payer: Medicare PPO | Admitting: Physical Therapy

## 2021-12-23 DIAGNOSIS — R278 Other lack of coordination: Secondary | ICD-10-CM | POA: Diagnosis not present

## 2021-12-23 DIAGNOSIS — I69351 Hemiplegia and hemiparesis following cerebral infarction affecting right dominant side: Secondary | ICD-10-CM | POA: Diagnosis not present

## 2021-12-23 DIAGNOSIS — M6281 Muscle weakness (generalized): Secondary | ICD-10-CM | POA: Diagnosis not present

## 2021-12-23 DIAGNOSIS — R2689 Other abnormalities of gait and mobility: Secondary | ICD-10-CM

## 2021-12-23 DIAGNOSIS — R2681 Unsteadiness on feet: Secondary | ICD-10-CM | POA: Diagnosis not present

## 2021-12-23 NOTE — Therapy (Signed)
?OUTPATIENT PHYSICAL THERAPY NEURO EVALUATION ? ? ?Patient Name: Cynthia [name] ?MRN: [id number] ?DOB:[birth year], 86 y.o., female ?Today's Date: 12/23/2021 ? ?PCP: Jonathon Jordan, MD ?REFERRING PROVIDER: Domenic Polite, MD  ? ? PT End of Session - 12/23/21 0804   ? ? Visit Number 1   ? Number of Visits 17   Plus eval  ? Date for PT Re-Evaluation 03/17/22   ? Authorization Type [employer] Medicare   ? Authorization Time Period Waiting on Auth   ? Progress Note Due on Visit 10   ? PT Start Time [phone]   ? PT Stop Time 0848   ? PT Time Calculation (min) 50 min   ? Activity Tolerance Patient tolerated treatment well   ? Behavior During Therapy [facility] for tasks assessed/performed   ? ?  ?  ? ?  ? ? ?Past Medical History:  ?Diagnosis Date  ? Anemia   ? Blood transfusion without reported diagnosis jan 2015  ? 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 Cancer [facility])   ? liver  ? Cervical cancer ([place]) 1972  ? Colon cancer ([place]) JAN 2015  ? Colon cancer ([place])   ? Complication of anesthesia   ? slow to awaken in past, DONE WELL RECENTLY  ? DVT (deep venous thrombosis) ([place]) 10/24/13  ? Right leg  ? Guillain-Barre ([place]) 09/2015  ? Heart murmur   ? Hypertension   ? no bp meds   ? Liver cyst   ? Stroke [facility]) 12/2014  ? NUMBNESS ON LEFT SIDE  ? Tinnitus of both ears ALL THE TIME  ? ?Past Surgical History:  ?Procedure Laterality Date  ? ABDOMINAL HYSTERECTOMY    ? vaginal- partial  ? BACK SURGERY  1981  ? lower lumbar  ? ESOPHAGOGASTRODUODENOSCOPY N/A 10/14/2015  ? Procedure: ESOPHAGOGASTRODUODENOSCOPY (EGD);  Surgeon: Clarene Essex, MD;  Location: [facility] ENDOSCOPY;  Service: Endoscopy;  Laterality: N/A;  Bedside in ICU  ? IR CV LINE INJECTION  07/20/2020  ? IR GENERIC HISTORICAL  09/07/2016  ? IR RADIOLOGIST EVAL & MGMT 09/07/2016 Aletta Edouard, MD [facility] INTERV RAD  ? IR IMAGING GUIDED PORT INSERTION  07/29/2019  ? LAPAROSCOPIC RIGHT COLECTOMY Right 10/22/2013  ? Procedure: LAPAROSCOPIC RIGHT COLECTOMY ;  Surgeon: [name], MD;  Location: [facility] ORS;  Service: General;  Laterality: Right;  ? SALPINGOOPHORECTOMY  10/22/2013  ? Procedure: BILATERALSALPINGO OOPHORECTOMY;  Surgeon: [name]. [name], MD;  Location: [facility] ORS;  Service: Gynecology;;  ? [place]  ? ?Patient Active Problem List  ? Diagnosis Date Noted  ? Pulmonary nodules 12/15/2021  ? Stroke [facility]) 12/14/2021  ? Port-A-Cath in place 08/13/2019  ? Goals of care, counseling/discussion 07/22/2019  ? Clawfoot, acquired, right 10/31/2016  ? Poor appetite   ? Neurogenic bladder   ? Adjustment disorder with mixed anxiety and depressed mood   ? Quadriplegia and quadriparesis ([place])   ? Numbness and tingling   ? Paresthesias   ? UTI (lower urinary tract infection)   ? History of colon cancer   ? Acute blood loss anemia   ? Upper GI bleed   ? GBS (Guillain-Barre syndrome) ([place])   ? Aspiration into airway   ? Endotracheally intubated   ? Acute respiratory failure ([place])   ? Shock ([place]) 10/09/2015  ? Hemoptysis   ? Transverse myelitis ([place])   ? Peripheral neuropathy 10/06/2015  ? Paresthesia of both hands 10/06/2015  ? Paresthesia of both feet 10/06/2015  ? Cerebral embolism  with cerebral infarction 10/06/2015  ? Ataxia   ? Colon carcinoma metastatic to liver ([place]) 09/25/2015  ? Metastasis to liver ([place]) 08/28/2015  ? Colon cancer metastasized to liver [facility])   ? Liver lesion   ? Cerebral infarction due to unspecified mechanism 01/29/2015  ? B12 deficiency 01/29/2015  ? Essential hypertension 01/29/2015  ? Arm numbness left   ? Disorientation   ? Numbness and tingling of left side of face   ? TIA (transient ischemic attack) 01/11/2015  ? Confusion   ? Left arm numbness   ? DVT, lower extremity, distal ([place]) 10/27/2013  ? T4a, N0 09/27/2013  ? Iron deficiency anemia 09/04/2013  ? Symptomatic anemia 09/03/2013  ? Orthostasis 09/03/2013  ? Near syncope 09/03/2013  ? ? ?ONSET DATE: 12/15/2021 (referral) ? ?REFERRING DIAG: I63.9 (ICD-10-CM) - Acute CVA (cerebrovascular accident) ([place])   ? ?THERAPY DIAG:  ?Unsteadiness on feet ? ?Hemiplegia and hemiparesis following cerebral infarction affecting right dominant side ([place]) ? ?Other abnormalities of gait and mobility ? ?SUBJECTIVE:  ?                                                                                                                                                                                           ? ?SUBJECTIVE STATEMENT: ?Pt reports she had her first stroke >6 years ago and then Guillain-Barre in 2017 which left her L hemibody numb. Had a second stroke on 12/12/21 which affected her R side . "I have no sides left so I cannot have another one". Pt reports she cannot stand, walk or move by herself.  ? ?Pt accompanied by:  Daughter, Hilda Blades ? ?PERTINENT HISTORY: metastatic colon cancer, no longer on active treatment chronic anemia, history of Guillain-Barr? syndrome, hypertension, prior stroke presented with acute onset right lower extremity weakness.  ? ?PAIN:  ?Are you having pain? No - "I am never free of the tingling sensation but I do not consider that pain"  ? ?VITALS: All BP readings taken in LUE while seated  ?Following MMT, pt became very hot and sweaty, provided water and checked vitals: 128/79 mmHg, HR 69 bpm  ? ?PRECAUTIONS: Fall ? ?WEIGHT BEARING RESTRICTIONS No ? ?FALLS: Has patient fallen in last 6 months? No, but multiple almost-falls in bathroom in which daughter catches her  ? ?LIVING ENVIRONMENT: ?Lives with: lives with their daughter ?Lives in: House/apartment ?Stairs: No ?Has following equipment at home: Single point cane, Walker - 2 wheeled, Crutches, Wheelchair (manual), shower chair, bed side commode, and Grab bars ? ?PLOF: Independent with basic ADLs and Requires assistive device for independence ? ?PATIENT GOALS "I wanna be able to get  up and go out by myself"  ? ?OBJECTIVE:  ? ?DIAGNOSTIC FINDINGS: MRI positive for acute left centrum semiovale stroke and left caudate tail  ? ?COGNITION: ?Overall cognitive  status: Within functional limits for tasks assessed ?  ?SENSATION: ?Reports baseline tingling of L hemibody 2/2 Guillain-Barre, R hand numbness following new CVA ? ?COORDINATION: ?Heel to shin test: WFL bilaterally  ? ? ?POSTURE: rounded shoulders and posterior pelvic tilt ? ?MMT:   ? ?MMT Right ?12/23/2021 Left ?12/23/2021  ?Hip flexion 4 4+  ?Hip extension    ?Hip abduction 4- 4  ?Hip adduction 4 4+  ?Hip internal rotation    ?Hip external rotation    ?Knee flexion 4- 4+  ?Knee extension 4 4+  ?Ankle dorsiflexion 4 4+  ?Ankle plantarflexion 4- 4+  ?Ankle inversion    ?Ankle eversion    ?(Blank rows = not tested) ? ?BED MOBILITY:  ?Pt reports she learned how to log roll following back surgery and then how to squat pivot w/GB so no issues. Daughter present for safety  ? ?TRANSFERS: ?Assistive device utilized: Environmental consultant - 2 wheeled  ?Sit to stand: SBA ?Stand to sit: SBA ?Chair to chair: SBA ? ?GAIT: ?Gait pattern: step through pattern, decreased hip/knee flexion- Right, decreased ankle dorsiflexion- Right, narrow BOS, poor foot clearance- Right, and poor foot clearance- Left ?Distance walked: Various clinic distances  ?Assistive device utilized: Environmental consultant - 2 wheeled ?Level of assistance: SBA ?Comments: Pt exhibits very slow and cautious gait pattern, but no instability or LOB noted  ? ?FUNCTIONAL TESTs:  ? [facility] PT Assessment - 12/23/21 0836   ? ?  ? Transfers  ? Five time sit to stand comments  24.34s w/BUE support   ? ?  ?  ? ?  ? ? ? ?PATIENT SURVEYS:  ?FOTO Not obtained by front desk staff  ? ?TODAY'S TREATMENT:  ?See below ? ? ?PATIENT EDUCATION: ?Education details: POC, eval findings, initial HEP, beginning walking program, contacting PCP about side effects of Plavix  ?Person educated: Patient and Child(ren) ?Education method: Explanation, Demonstration, Verbal cues, and Handouts ?Education comprehension: verbalized understanding and needs further education ? ? ?HOME EXERCISE PROGRAM: ?Access Code: [email or web address] ?URL:  [email or web address] ?Date: 12/23/2021 ?Prepared by: Mickie Bail [place] ? ?Exercises ?- Sit to Stand Without Arm Support  - 1 x daily - 7 x weekly - 3 sets - 10 reps ? ? ? ?GOALS: ?Goals reviewed with p

## 2021-12-24 ENCOUNTER — Ambulatory Visit (INDEPENDENT_AMBULATORY_CARE_PROVIDER_SITE_OTHER): Payer: Medicare PPO

## 2021-12-24 ENCOUNTER — Other Ambulatory Visit: Payer: Self-pay

## 2021-12-24 DIAGNOSIS — I63312 Cerebral infarction due to thrombosis of left middle cerebral artery: Secondary | ICD-10-CM | POA: Diagnosis not present

## 2021-12-24 DIAGNOSIS — I639 Cerebral infarction, unspecified: Secondary | ICD-10-CM

## 2021-12-24 DIAGNOSIS — R000 Tachycardia, unspecified: Secondary | ICD-10-CM

## 2021-12-24 NOTE — Patient Outreach (Signed)
Received a red flag Emmi stroke notification for Cynthia [name] . ?I have assigned Jon Billings, RN to call for follow up and determine if there are any Case Management needs.  ?  ?Arville Care, CBCS, CMAA ?[place] Management Assistant ?[place] Management ?[phone]   ?

## 2021-12-24 NOTE — Patient Outreach (Signed)
[facility]) Care Management ? ?12/24/2021 ? ?Cynthia [name] ?March 10, 1936 ?[id number] ? ? ?EMMI- Stroke ?RED ON EMMI ALERT ?Day # 6 ?Date: 12/23/21 ?Red Alert Reason: Questions/problems with meds? yes ? ?Outreach attempt: spoke with patient.  Discussed red alert.  Patient states she has no medication questions at this time.  She does add that she is no longer going to see Dr. Stephanie Acre and is working to get a new PCP. Did give patient resources of finding a doctor at [phone].  Patient appreciative of information.   ? ?Discussed [facility] ongoing services and support. Patient declined services right now as she states she has a lot going on.  However, she is agreeable to letter and brochure for future reference.   ? ? ?Plan: ?RN CM will send letter and close case.   ? ?Jone Baseman, RN, MSN ?[facility] Care Management ?Care Management Coordinator ?Direct Line [phone] ?Toll Free: [phone]  ?Fax: [phone] ? ?

## 2021-12-31 ENCOUNTER — Other Ambulatory Visit: Payer: Self-pay

## 2021-12-31 NOTE — Patient Outreach (Signed)
[facility]) Care Management  12/31/2021  Cynthia [name] May 03, 1936 [id number]   EMMI-Stroke RED ON EMMI ALERT Day 13 Date: 12/30/21 Red Alert Reason:  Martin Majestic to follow-up appointment?   No  Scheduled a follow-up appointment?   No    Outreach attempt: Spoke with patient.  She reports she is doing ok.  Addressed red flag.  She states she has done a phone call with PCP but did not go to the office.  She states she is still wanting to change her PCP and has one in mind that her daughter uses.  However, states she is waiting to set the appointment as she has other specialist follow ups.    Discussed [facility] continued services and support. Patient still declines right now but states she did receive letter and brochure in the mail.  She states she will call if she wants care management support.     Plan: RN CM will close case.    Jone Baseman, RN, MSN [facility] Care Management Care Management Coordinator Direct Line [phone] Toll Free: [phone]  Fax: [phone]

## 2021-12-31 NOTE — Therapy (Addendum)
OUTPATIENT OCCUPATIONAL THERAPY NEURO EVALUATION  Patient Name: Cynthia [name] MRN: [id number] DOB:Jul 18, 1936, 86 y.o., female Today's Date: 01/03/2022  PCP: Dr. Jonathon Jordan REFERRING PROVIDER: Domenic Polite, MD; will follow up with  Dr. Metta Clines (neurology)     OT End of Session - 01/03/22 1018     Visit Number 2    Number of Visits 17    Date for OT Re-Evaluation 02/19/22    Authorization Type [employer] Medicare; prior auth required    Authorization - Visit Number 2    Authorization - Number of Visits 10    Progress Note Due on Visit 10    OT Start Time 1017    OT Stop Time 1058    OT Time Calculation (min) 41 min    Activity Tolerance Patient tolerated treatment well    Behavior During Therapy [facility] for tasks assessed/performed              Past Medical History:  Diagnosis Date   Anemia    Blood transfusion without reported diagnosis jan 2015   C. difficile colitis 03/05/14, 03/22/14, 04/05/14   recurrent   Cancer ([place])    liver   Cervical cancer ([place]) 1972   Colon cancer ([place]) JAN 2015   Colon cancer ([place])    Complication of anesthesia    slow to awaken in past, DONE WELL RECENTLY   DVT (deep venous thrombosis) ([place]) 10/24/13   Right leg   Guillain-Barre ([place]) 09/2015   Heart murmur    Hypertension    no bp meds    Liver cyst    Stroke ([place]) 12/2014   NUMBNESS ON LEFT SIDE   Tinnitus of both ears ALL THE TIME   Past Surgical History:  Procedure Laterality Date   ABDOMINAL HYSTERECTOMY     vaginal- partial   BACK SURGERY  1981   lower lumbar   ESOPHAGOGASTRODUODENOSCOPY N/A 10/14/2015   Procedure: ESOPHAGOGASTRODUODENOSCOPY (EGD);  Surgeon: Clarene Essex, MD;  Location: [facility] ENDOSCOPY;  Service: Endoscopy;  Laterality: N/A;  Bedside in ICU   IR CV LINE INJECTION  07/20/2020   IR GENERIC HISTORICAL  09/07/2016   IR RADIOLOGIST EVAL & MGMT 09/07/2016 Aletta Edouard, MD [facility] INTERV RAD   IR IMAGING GUIDED PORT INSERTION  07/29/2019   LAPAROSCOPIC RIGHT  COLECTOMY Right 10/22/2013   Procedure: LAPAROSCOPIC RIGHT COLECTOMY ;  Surgeon: [name], MD;  [facility] ORS;  Service: General;  Laterality: Right;   SALPINGOOPHORECTOMY  10/22/2013   Procedure: Lesly Dukes OOPHORECTOMY;  Surgeon: [name]. [name], MD;  Location: [facility] ORS;  Service: Gynecology;;   TUBAL LIGATION  1968   Patient Active Problem List   Diagnosis Date Noted   Pulmonary nodules 12/15/2021   Stroke ([place]) 12/14/2021   Port-A-Cath in place 08/13/2019   Goals of care, counseling/discussion 07/22/2019   Clawfoot, acquired, right 10/31/2016   Poor appetite    Neurogenic bladder    Adjustment disorder with mixed anxiety and depressed mood    Quadriplegia and quadriparesis (HCC)    Numbness and tingling    Paresthesias    UTI (lower urinary tract infection)    History of colon cancer    Acute blood loss anemia    Upper GI bleed    GBS (Guillain-Barre syndrome) (HCC)    Aspiration into airway    Endotracheally intubated    Acute respiratory failure ([place])    Shock ([place]) 10/09/2015   Hemoptysis    Transverse myelitis ([place])    Peripheral neuropathy 10/06/2015  Paresthesia of both hands 10/06/2015   Paresthesia of both feet 10/06/2015   Cerebral embolism with cerebral infarction 10/06/2015   Ataxia    Colon carcinoma metastatic to liver ([place]) 09/25/2015   Metastasis to liver ([place]) 08/28/2015   Colon cancer metastasized to liver [facility])    Liver lesion    Cerebral infarction due to unspecified mechanism 01/29/2015   B12 deficiency 01/29/2015   Essential hypertension 01/29/2015   Arm numbness left    Disorientation    Numbness and tingling of left side of face    TIA (transient ischemic attack) 01/11/2015   Confusion    Left arm numbness    DVT, lower extremity, distal ([place]) 10/27/2013   T4a, N0 09/27/2013   Iron deficiency anemia 09/04/2013   Symptomatic anemia 09/03/2013   Orthostasis 09/03/2013   Near syncope 09/03/2013    ONSET DATE: 12/14/21    REFERRING DIAG: I63.9 (ICD-10-CM) - Acute CVA (cerebrovascular accident) ([place])   THERAPY DIAG:  Hemiplegia and hemiparesis following cerebral infarction affecting right dominant side (HCC)  Other lack of coordination  Unsteadiness on feet  Muscle weakness (generalized)  SUBJECTIVE:   SUBJECTIVE STATEMENT: Pt reports that writing gets smaller the more she writes.   Pt reports that she is getting stronger and that she can stand without pushing with UEs now and can put on shoes now.   Pt accompanied by: daughter  PERTINENT HISTORY: presented to ED with acute onset of RLE weakness.  MRI showed small acute infarcts of L caudate tail and anterior L centrum semiovale, Wearing heart monitor for 1 month (until 01/22/22)  PMH also includes:  hx of Guillian Barre, hx of CVA  (L numbness 2016), hx of colon cancer s/p colectomy with hepatic metastasis, pulmonary nodules,  tinnitus of both ears, paripheral neuropathy  PRECAUTIONS: Fall, 24 hr supervision, active CA (lung--monitoring)  PAIN:  Are you having pain? No  OBJECTIVE:  TODAY'S TREATMENT:   Practiced writing:   Pt wrote 8 sentences with good legibility, size did decr somewhat as she wrote.  Standing, functional reaching with lateral wt. Shifts, reaching outside base of support with each UE with close supervision for incr activity tolerance and balance, min v.c. for feet further apart.    Standing, Stepping over approx 2' obstacle with UE support and CGA in prep for/to simulate shower transfer (tub/shower combo) with min v.c.  x4, but pt with difficulty/min A with RLE for last rep due to fatigue  Sitting, passing bag under each foot to simulate LB dressing and work on LE strength.  Pt performed x8 each LE, but reports fatigue.     PATIENT EDUCATION: Education details: Coordination HEP--see pt instructions  Person educated: Patient and Child(ren) Education method: Explanation, Demo , Handout Education comprehension: verbalized  understanding, returned demo   HOME EXERCISE PROGRAM: 01/03/22:  Coordination HEP    GOALS: Potential Goals reviewed with patient? Yes  SHORT TERM GOALS: Target date: 01/18/2022  Pt will be independent with initial HEP. Goal status: INITIAL  2.  Pt will perform BADLs mod I except shower transfer. Goal status: INITIAL  3.  Pt will perform simple home maintenance task mod I Goal status: INITIAL  4.  Pt will improve R hand coordination for ADLs as shown improving  completing 9-hole peg test by at least 3sec. Baseline:  26.75sec Goal status: INITIAL    LONG TERM GOALS: Target date: 02/15/2022  Pt will be independent with updated HEP. Goal status: INITIAL  2.  Pt will perform simple  cooking task mod I. Goal status: INITIAL  3.  Pt will perform shower transfer mod I. Goal status: INITIAL  4.  Pt will be able to plant flowers in pot mod I. Goal status: INITIAL  5.  Pt will be able to rake with supervision. Goal status: INITIAL    ASSESSMENT:  CLINICAL IMPRESSION: Pt is progressing towards goals as she reports that she is now able to don shoes.    PERFORMANCE DEFICITS in functional skills including ADLs, IADLs, coordination, dexterity, FMC, GMC, mobility, balance, endurance, decreased knowledge of use of DME, and UE functional use, cognitive skills including memory.  IMPAIRMENTS are limiting patient from ADLs, IADLs, and leisure.   COMORBIDITIES  hx of CA (cervical, colon, liver cyst), hx of Guillain-Barre Syndrome (09/2015), HTN, hx of CVA that affects occupational performance. Patient will benefit from skilled OT to address above impairments and improve overall function.  MODIFICATION OR ASSISTANCE TO COMPLETE EVALUATION: Min-Moderate modification of tasks or assist with assess necessary to complete an evaluation.  OT OCCUPATIONAL PROFILE AND HISTORY: Detailed assessment: Review of records and additional review of physical, cognitive, psychosocial history related to  current functional performance.  CLINICAL DECISION MAKING: Moderate - several treatment options, min-mod task modification necessary  REHAB POTENTIAL: Good  EVALUATION COMPLEXITY: Moderate    PLAN: OT FREQUENCY: 2x/week  OT DURATION: 8 weeks +eval  PLANNED INTERVENTIONS: self care/ADL training, therapeutic exercise, therapeutic activity, neuromuscular re-education, manual therapy, passive range of motion, balance training, functional mobility training, patient/family education, cognitive remediation/compensation, energy conservation, and DME and/or AE instructions  RECOMMENDED OTHER SERVICES: scheduled for PT eval Thurs.  CONSULTED AND AGREED WITH PLAN OF CARE: Patient and family member/caregiver  PLAN FOR NEXT SESSION:  review coordination HEP; standing balance for functional tasks, simple IADL   Vaneta Hammontree, OT 01/03/2022, 12:20 PM  Vianne Bulls, OTR/L [facility] [address] [place], [place]  [address] [phone] phone [phone] 01/03/22 12:20 PM

## 2021-12-31 NOTE — Patient Outreach (Signed)
Received a red flag Emmi stroke notification for Cynthia [name] . I have assigned Jon Billings, RN to call for follow up and determine if there are any Case Management needs.    Arville Care, [place], [place] Management [phone]

## 2022-01-03 ENCOUNTER — Ambulatory Visit: Payer: Medicare PPO | Admitting: Occupational Therapy

## 2022-01-03 ENCOUNTER — Ambulatory Visit: Payer: Medicare PPO | Admitting: Physical Therapy

## 2022-01-03 ENCOUNTER — Encounter: Payer: Self-pay | Admitting: Occupational Therapy

## 2022-01-03 DIAGNOSIS — I69351 Hemiplegia and hemiparesis following cerebral infarction affecting right dominant side: Secondary | ICD-10-CM

## 2022-01-03 DIAGNOSIS — M6281 Muscle weakness (generalized): Secondary | ICD-10-CM

## 2022-01-03 DIAGNOSIS — R2681 Unsteadiness on feet: Secondary | ICD-10-CM

## 2022-01-03 DIAGNOSIS — R278 Other lack of coordination: Secondary | ICD-10-CM | POA: Diagnosis not present

## 2022-01-03 DIAGNOSIS — R2689 Other abnormalities of gait and mobility: Secondary | ICD-10-CM | POA: Diagnosis not present

## 2022-01-03 NOTE — Patient Instructions (Addendum)
    Coordination Activities  Perform the following activities for 15-20 minutes 1 times per day with right hand(s).  Rotate ball in fingertips (clockwise and counter-clockwise). Flip cards 1 at a time as fast as you can. Deal cards with your thumb (Hold deck in hand and push card off top with thumb). Pick up coins and stack. Pick up coins one at a time until you get 5-10 in your hand, then move coins from palm to fingertips to place in coin bank/container one at a time. Practice writing, try using colored pencils to color

## 2022-01-03 NOTE — Therapy (Signed)
OUTPATIENT OCCUPATIONAL THERAPY NEURO EVALUATION  Patient Name: Cynthia [name] MRN: [id number] DOB:01-07-1936, 87 y.o., female Today's Date: 01/06/2022  PCP: Dr. Jonathon Jordan REFERRING PROVIDER: Domenic Polite, MD; will follow up with  Dr. Metta Clines (neurology)     OT End of Session - 01/06/22 1318     Visit Number 3    Number of Visits 17    Date for OT Re-Evaluation 02/19/22    Authorization Type [employer]; prior auth required    Authorization - Visit Number 3    Authorization - Number of Visits 10    Progress Note Due on Visit 10    OT Start Time 1318    OT Stop Time 1400    OT Time Calculation (min) 42 min    Activity Tolerance Patient tolerated treatment well    Behavior During Therapy [facility] for tasks assessed/performed               Past Medical History:  Diagnosis Date   Anemia    Blood transfusion without reported diagnosis jan 2015   C. difficile colitis 03/05/14, 03/22/14, 04/05/14   recurrent   Cancer ([place])    liver   Cervical cancer ([place]) 1972   Colon cancer ([place]) JAN 2015   Colon cancer ([place])    Complication of anesthesia    slow to awaken in past, DONE WELL RECENTLY   DVT (deep venous thrombosis) ([place]) 10/24/13   Right leg   Guillain-Barre ([place]) 09/2015   Heart murmur    Hypertension    no bp meds    Liver cyst    Stroke ([place]) 12/2014   NUMBNESS ON LEFT SIDE   Tinnitus of both ears ALL THE TIME   Past Surgical History:  Procedure Laterality Date   ABDOMINAL HYSTERECTOMY     vaginal- partial   BACK SURGERY  1981   lower lumbar   ESOPHAGOGASTRODUODENOSCOPY N/A 10/14/2015   Procedure: ESOPHAGOGASTRODUODENOSCOPY (EGD);  Surgeon: Clarene Essex, MD;  Location: [facility] ENDOSCOPY;  Service: Endoscopy;  Laterality: N/A;  Bedside in ICU   IR CV LINE INJECTION  07/20/2020   IR GENERIC HISTORICAL  09/07/2016   IR RADIOLOGIST EVAL & MGMT 09/07/2016 Aletta Edouard, MD [facility] INTERV RAD   IR IMAGING GUIDED PORT INSERTION  07/29/2019   LAPAROSCOPIC RIGHT  COLECTOMY Right 10/22/2013   Procedure: LAPAROSCOPIC RIGHT COLECTOMY ;  Surgeon: [name], MD;  Location: [facility] ORS;  Service: General;  Laterality: Right;   SALPINGOOPHORECTOMY  10/22/2013   Procedure: Lesly Dukes OOPHORECTOMY;  Surgeon: [name]. [name], MD;  Location: [facility] ORS;  Service: Gynecology;;   TUBAL LIGATION  1968   Patient Active Problem List   Diagnosis Date Noted   Pulmonary nodules 12/15/2021   Stroke ([place]) 12/14/2021   Port-A-Cath in place 08/13/2019   Goals of care, counseling/discussion 07/22/2019   Clawfoot, acquired, right 10/31/2016   Poor appetite    Neurogenic bladder    Adjustment disorder with mixed anxiety and depressed mood    Quadriplegia and quadriparesis (HCC)    Numbness and tingling    Paresthesias    UTI (lower urinary tract infection)    History of colon cancer    Acute blood loss anemia    Upper GI bleed    GBS (Guillain-Barre syndrome) (HCC)    Aspiration into airway    Endotracheally intubated    Acute respiratory failure ([place])    Shock ([place]) 10/09/2015   Hemoptysis    Transverse myelitis (HCC)    Peripheral neuropathy  10/06/2015   Paresthesia of both hands 10/06/2015   Paresthesia of both feet 10/06/2015   Cerebral embolism with cerebral infarction 10/06/2015   Ataxia    Colon carcinoma metastatic to liver ([place]) 09/25/2015   Metastasis to liver ([place]) 08/28/2015   Colon cancer metastasized to liver [facility])    Liver lesion    Cerebral infarction due to unspecified mechanism 01/29/2015   B12 deficiency 01/29/2015   Essential hypertension 01/29/2015   Arm numbness left    Disorientation    Numbness and tingling of left side of face    TIA (transient ischemic attack) 01/11/2015   Confusion    Left arm numbness    DVT, lower extremity, distal ([place]) 10/27/2013   T4a, N0 09/27/2013   Iron deficiency anemia 09/04/2013   Symptomatic anemia 09/03/2013   Orthostasis 09/03/2013   Near syncope 09/03/2013    ONSET DATE: 12/14/21    REFERRING DIAG: I63.9 (ICD-10-CM) - Acute CVA (cerebrovascular accident) ([place])   THERAPY DIAG:  Unsteadiness on feet  Muscle weakness (generalized)  Other lack of coordination  Hemiplegia and hemiparesis following cerebral infarction affecting right dominant side (HCC)  SUBJECTIVE:   SUBJECTIVE STATEMENT: Pt reports that she walked a lot yesterday at the cancer center "seemed like 100 miles"  Pt accompanied by: daughter  PERTINENT HISTORY: presented to ED with acute onset of RLE weakness.  MRI showed small acute infarcts of L caudate tail and anterior L centrum semiovale, Wearing heart monitor for 1 month (until 01/22/22)  PMH also includes:  hx of Guillian Barre, hx of CVA  (L numbness 2016), hx of colon cancer s/p colectomy with hepatic metastasis, pulmonary nodules,  tinnitus of both ears, paripheral neuropathy  PRECAUTIONS: Fall, 24 hr supervision, active CA (lung--monitoring)  PAIN:  Are you having pain? No  OBJECTIVE:  TODAY'S TREATMENT:    Completing Purdue Pegboard with R hand with min difficulty for incr coordination.  Standing, Placing small pegs in vertical pegboard to copy design for incr coordination and activity tolerance.   Standing, functional step and reach with each UE/LE to the side to flip cards with CGA for balance and min cueing for stepping bigger to target for incr wt. Shift for improved balance.  Ambulating to gather clothespins by color for environmental scanning/navigation and functional reach for clothespins from various heights (lower and upper cabinets) to simulate IADL tasks.  Pt with good RW navigation (with CGA/close supervision) with no LOB.       PATIENT EDUCATION: Education details: Reviewed Artist Person educated: Patient and Child(ren) Education method: Explanation, Demo  Education comprehension: verbalized understanding, returned demo   HOME EXERCISE PROGRAM: 01/03/22:  Coordination HEP    GOALS: Potential  Goals reviewed with patient? Yes  SHORT TERM GOALS: Target date: 01/18/2022  Pt will be independent with initial HEP. Goal status: INITIAL  2.  Pt will perform BADLs mod I except shower transfer. Goal status: INITIAL  3.  Pt will perform simple home maintenance task mod I Goal status: INITIAL  4.  Pt will improve R hand coordination for ADLs as shown improving  completing 9-hole peg test by at least 3sec. Baseline:  26.75sec Goal status: INITIAL    LONG TERM GOALS: Target date: 02/15/2022  Pt will be independent with updated HEP. Goal status: INITIAL  2.  Pt will perform simple cooking task mod I. Goal status: INITIAL  3.  Pt will perform shower transfer mod I. Goal status: INITIAL  4.  Pt will be able to plant  flowers in pot mod I. Goal status: INITIAL  5.  Pt will be able to rake with supervision. Goal status: INITIAL    ASSESSMENT:  CLINICAL IMPRESSION: Pt is progressing towards goals with improving activity tolerance and coordination.    PERFORMANCE DEFICITS in functional skills including ADLs, IADLs, coordination, dexterity, FMC, GMC, mobility, balance, endurance, decreased knowledge of use of DME, and UE functional use, cognitive skills including memory.  IMPAIRMENTS are limiting patient from ADLs, IADLs, and leisure.   COMORBIDITIES  hx of CA (cervical, colon, liver cyst), hx of Guillain-Barre Syndrome (09/2015), HTN, hx of CVA that affects occupational performance. Patient will benefit from skilled OT to address above impairments and improve overall function.  MODIFICATION OR ASSISTANCE TO COMPLETE EVALUATION: Min-Moderate modification of tasks or assist with assess necessary to complete an evaluation.  OT OCCUPATIONAL PROFILE AND HISTORY: Detailed assessment: Review of records and additional review of physical, cognitive, psychosocial history related to current functional performance.  CLINICAL DECISION MAKING: Moderate - several treatment options, min-mod  task modification necessary  REHAB POTENTIAL: Good  EVALUATION COMPLEXITY: Moderate    PLAN: OT FREQUENCY: 2x/week  OT DURATION: 8 weeks +eval  PLANNED INTERVENTIONS: self care/ADL training, therapeutic exercise, therapeutic activity, neuromuscular re-education, manual therapy, passive range of motion, balance training, functional mobility training, patient/family education, cognitive remediation/compensation, energy conservation, and DME and/or AE instructions  RECOMMENDED OTHER SERVICES: scheduled for PT eval Thurs.  CONSULTED AND AGREED WITH PLAN OF CARE: Patient and family member/caregiver  PLAN FOR NEXT SESSION:  standing balance for functional tasks, simple IADL   Antaniya Venuti, OT 01/06/2022, 2:12 PM  Vianne Bulls, OTR/L [facility] [address] [place], [place]  [address] [phone] phone [phone] 01/06/22 2:12 PM

## 2022-01-03 NOTE — Patient Instructions (Signed)
Walking Program:  Begin walking for exercise for 3 minutes, 2 times/day, most days/week.   Progress your walking program by adding 1-2 minutes to your routine each week, as tolerated. Be sure to wear good walking shoes, walk in a safe environment and only progress to your tolerance.

## 2022-01-03 NOTE — Therapy (Addendum)
OUTPATIENT PHYSICAL THERAPY NEURO Treatment   Patient Name: Cynthia [name] MRN: [id number] DOB:04-01-1936, 86 y.o., female Today's Date: 01/03/2022  PCP: Jonathon Jordan, MD REFERRING PROVIDER: Domenic Polite, MD      Past Medical History:  Diagnosis Date   Anemia    Blood transfusion without reported diagnosis jan 2015   C. difficile colitis 03/05/14, 03/22/14, 04/05/14   recurrent   Cancer ([place])    liver   Cervical cancer ([place]) 1972   Colon cancer ([place]) JAN 2015   Colon cancer ([place])    Complication of anesthesia    slow to awaken in past, DONE WELL RECENTLY   DVT (deep venous thrombosis) ([place]) 10/24/13   Right leg   Guillain-Barre ([place]) 09/2015   Heart murmur    Hypertension    no bp meds    Liver cyst    Stroke ([place]) 12/2014   NUMBNESS ON LEFT SIDE   Tinnitus of both ears ALL THE TIME   Past Surgical History:  Procedure Laterality Date   ABDOMINAL HYSTERECTOMY     vaginal- partial   BACK SURGERY  1981   lower lumbar   ESOPHAGOGASTRODUODENOSCOPY N/A 10/14/2015   Procedure: ESOPHAGOGASTRODUODENOSCOPY (EGD);  Surgeon: Clarene Essex, MD;  Location: [facility] ENDOSCOPY;  Service: Endoscopy;  Laterality: N/A;  Bedside in ICU   IR CV LINE INJECTION  07/20/2020   IR GENERIC HISTORICAL  09/07/2016   IR RADIOLOGIST EVAL & MGMT 09/07/2016 Aletta Edouard, MD [facility] INTERV RAD   IR IMAGING GUIDED PORT INSERTION  07/29/2019   LAPAROSCOPIC RIGHT COLECTOMY Right 10/22/2013   Procedure: LAPAROSCOPIC RIGHT COLECTOMY ;  Surgeon: [name], MD;  Location[facility]S;  Service: General;  Laterality: Right;   SALPINGOOPHORECTOMY  10/22/2013   Procedure: Lesly Dukes OOPHORECTOMY;  Surgeon: [name]. [name], MD;  Location: [facility]S;  Service: Gynecology;;   TUBAL LIGATION  1968   Patient Active Problem List   Diagnosis Date Noted   Pulmonary nodules 12/15/2021   Stroke ([place]) 12/14/2021   Port-A-Cath in place 08/13/2019   Goals of care, counseling/discussion 07/22/2019   Clawfoot,  acquired, right 10/31/2016   Poor appetite    Neurogenic bladder    Adjustment disorder with mixed anxiety and depressed mood    Quadriplegia and quadriparesis (HCC)    Numbness and tingling    Paresthesias    UTI (lower urinary tract infection)    History of colon cancer    Acute blood loss anemia    Upper GI bleed    GBS (Guillain-Barre syndrome) (HCC)    Aspiration into airway    Endotracheally intubated    Acute respiratory failure ([place])    Shock ([place]) 10/09/2015   Hemoptysis    Transverse myelitis ([place])    Peripheral neuropathy 10/06/2015   Paresthesia of both hands 10/06/2015   Paresthesia of both feet 10/06/2015   Cerebral embolism with cerebral infarction 10/06/2015   Ataxia    Colon carcinoma metastatic to liver ([place]) 09/25/2015   Metastasis to liver ([place]) 08/28/2015   Colon cancer metastasized to liver [facility])    Liver lesion    Cerebral infarction due to unspecified mechanism 01/29/2015   B12 deficiency 01/29/2015   Essential hypertension 01/29/2015   Arm numbness left    Disorientation    Numbness and tingling of left side of face    TIA (transient ischemic attack) 01/11/2015   Confusion    Left arm numbness    DVT, lower extremity, distal ([place]) 10/27/2013   T4a, N0 09/27/2013  Iron deficiency anemia 09/04/2013   Symptomatic anemia 09/03/2013   Orthostasis 09/03/2013   Near syncope 09/03/2013    ONSET DATE: 12/15/2021 (referral)  REFERRING DIAG: I63.9 (ICD-10-CM) - Acute CVA (cerebrovascular accident) ([place])   THERAPY DIAG:  No diagnosis found.  SUBJECTIVE:                                                                                                                                                                                              SUBJECTIVE STATEMENT: Pt reports having new heart monitor on, new since last visit. Goes to see Cardiologist 6/14.   PAIN:  Are you having pain? No -    PRECAUTIONS: Fall    OBJECTIVE:    TODAY'S  TREATMENT:   [place] Adult PT Treatment/Exercise - 01/03/22 0001       Ambulation/Gait   Ambulation/Gait Yes    Ambulation/Gait Assistance 5: Supervision    Ambulation/Gait Assistance Details training for endurance, working on balance with visual scanning and head  turns. Pt reported needing to sit and rest LEs around 230'.    Ambulation Distance (Feet) 230 Feet    Assistive device Rolling walker             Pt performed LE strengthening exercises (see below) with 3lb weights, 2x5, cues given for technique.  PATIENT EDUCATION: Education details: Updated HEP for seated LE strengthening and gave HEP  handout for walking program. Person educated: Patient and Child(ren) Education method: Explanation, Demonstration, Verbal cues, and Handouts Education comprehension: verbalized understanding and needs further education   HOME EXERCISE PROGRAM: Walking Program: given 01/03/22   Begin walking for exercise for 3 minutes, 2 times/day, most days/week.   Progress your walking program by adding 1-2 minutes to your routine each week, as tolerated. Be sure to wear good walking shoes, walk in a safe environment and only progress to your tolerance.     Access Code: [email or web address] URL: [email or web address] Date: 01/03/2022 Prepared by: Oneita Kras  Exercises - Sit to Stand Without Arm Support  - 1 x daily - 7 x weekly - 3 sets - 10 reps - Seated Long Arc Quad with Ankle Weight  - 1-2 x daily - 5 x weekly - 2 sets - 5 reps - 3 hold - Seated Hip Flexion March with Ankle Weights  - 1-2 x daily - 5 x weekly - 2 sets - 5 reps - 3 hold - Seated Knee Flexion with Ankle weights  - 1-2 x daily - 5 x weekly - 2 sets - 5 reps - 3 hold  GOALS: Goals reviewed with patient? Yes  SHORT TERM GOALS: Target  date: 01/31/2022   (Remove Blue Hyperlink)  Pt will be independent with initial HEP for improved strength, balance, transfers and gait.  Baseline: initiated on eval Goal status: INITIAL  2.  Gait  velocity will be assessed w/LRAD and STG/LTG written Baseline:  Goal status: INITIAL  3.  Berg to be assessed and STG/LTG written Baseline:  Goal status: INITIAL  4.  Pt will improve 5 x STS to less than or equal to 20 seconds without UE support to demonstrate improved functional strength and transfer efficiency.   Baseline: 24.34s w/BUE support Goal status: INITIAL  5.  Pt will ambulate 250' on unlevel surface w/S* and LRAD for improved functional mobility and imitation of home environment  Baseline:  Goal status: INITIAL   LONG TERM GOALS: Target date: 02/28/2022   (Remove Blue Hyperlink)  Pt will be independent with final HEP for improved strength, balance, transfers and gait.  Baseline:  Goal status: INITIAL  2.  Berg goal Baseline:  Goal status: INITIAL  3.  Gait velocity goal  Baseline:  Goal status: INITIAL  4.  Pt will improve 5 x STS to less than or equal to 15 seconds without UE support to demonstrate improved functional strength and transfer efficiency.   Baseline: 24.34s w/BUE support Goal status: INITIAL  5.  Pt will ambulate >500' on level/unlevel surface w/LRAD mod I for improved independence with functional mobility  Baseline:  Goal status: INITIAL  ASSESSMENT:  CLINICAL IMPRESSION: Patient is a    OBJECTIVE IMPAIRMENTS Abnormal gait, decreased activity tolerance, decreased balance, decreased endurance, decreased mobility, decreased strength, and impaired sensation.   ACTIVITY LIMITATIONS community activity, driving, meal prep, laundry, medication management, and yard work.   PERSONAL FACTORS Age, Past/current experiences, Time since onset of injury/illness/exacerbation, and 1 comorbidity: hx of Guillain-Barre  are also affecting patient's functional outcome.    REHAB POTENTIAL: Good  CLINICAL DECISION MAKING: Stable/uncomplicated  EVALUATION COMPLEXITY: Low  PLAN: PT FREQUENCY: 2x/week  PT DURATION: 12 weeks  PLANNED INTERVENTIONS:  Therapeutic exercises, Therapeutic activity, Neuromuscular re-education, Balance training, Gait training, Patient/Family education, Stair training, DME instructions, and Aquatic Therapy  PLAN FOR NEXT SESSION: Assess Berg and gait speed and update STG/LTGs. Add to HEP to address deficits highlighted on Berg and R NMR   Bjorn Loser, PTA  01/03/22, 12:59 PM

## 2022-01-05 ENCOUNTER — Encounter: Payer: Self-pay | Admitting: Nurse Practitioner

## 2022-01-05 ENCOUNTER — Inpatient Hospital Stay: Payer: Medicare PPO | Attending: Oncology

## 2022-01-05 ENCOUNTER — Inpatient Hospital Stay: Payer: Medicare PPO | Admitting: Nurse Practitioner

## 2022-01-05 VITALS — BP 134/77 | HR 73 | Temp 98.2°F | Resp 18 | Ht 61.0 in | Wt 121.6 lb

## 2022-01-05 DIAGNOSIS — C189 Malignant neoplasm of colon, unspecified: Secondary | ICD-10-CM | POA: Diagnosis not present

## 2022-01-05 DIAGNOSIS — Z95828 Presence of other vascular implants and grafts: Secondary | ICD-10-CM

## 2022-01-05 DIAGNOSIS — C7802 Secondary malignant neoplasm of left lung: Secondary | ICD-10-CM | POA: Diagnosis not present

## 2022-01-05 DIAGNOSIS — Z7982 Long term (current) use of aspirin: Secondary | ICD-10-CM | POA: Diagnosis not present

## 2022-01-05 DIAGNOSIS — C180 Malignant neoplasm of cecum: Secondary | ICD-10-CM | POA: Insufficient documentation

## 2022-01-05 DIAGNOSIS — C787 Secondary malignant neoplasm of liver and intrahepatic bile duct: Secondary | ICD-10-CM

## 2022-01-05 DIAGNOSIS — C7801 Secondary malignant neoplasm of right lung: Secondary | ICD-10-CM | POA: Insufficient documentation

## 2022-01-05 MED ORDER — HEPARIN SOD (PORK) LOCK FLUSH 100 UNIT/ML IV SOLN
500.0000 [IU] | Freq: Once | INTRAVENOUS | Status: AC | PRN
  Administered 2022-01-05: 500 [IU]

## 2022-01-05 MED ORDER — SODIUM CHLORIDE 0.9% FLUSH
10.0000 mL | INTRAVENOUS | Status: DC | PRN
  Administered 2022-01-05: 10 mL

## 2022-01-05 NOTE — Progress Notes (Signed)
Cynthia [name]   Diagnosis: Colon cancer  INTERVAL HISTORY:   Cynthia [name] returns as scheduled.  She was hospitalized 12/14/2021 through 12/16/2021 presenting with acute onset right lower extremity weakness.  MRI showed small acute infarcts of the left caudate tail and anterior left centrum semiovale.  She was started on aspirin 81 mg daily and clopidogrel 75 mg daily for 3 weeks, then aspirin alone.  She reports the right leg/foot weakness has improved.  She is working with physical therapy.  She is undergoing evaluation for atrial fibrillation.  She has a good appetite.  No change along the right chest wall.  Bowels are moving.  Objective:  Vital signs in last 24 hours:  Blood pressure 134/77, pulse 73, temperature 98.2 F (36.8 C), temperature source Oral, resp. rate 18, height 5' 1"  (1.549 m), weight 121 lb 9.6 oz (55.2 kg), SpO2 100 %.    Resp: Lungs clear bilaterally. Cardio: Regular rate and rhythm. GI: No hepatosplenomegaly.  Persistent rounded fullness right upper abdomen. Vascular: No leg edema. Neuro: Minimal decrease in strength at the right lower leg. Skin: Mild skin thickening at the right lateral chest wall, no significant induration. Port-A-Cath without erythema.  Lab Results:  Lab Results  Component Value Date   WBC 5.8 12/14/2021   HGB 11.6 (L) 12/14/2021   HCT 37.4 12/14/2021   MCV 89.0 12/14/2021   PLT 257 12/14/2021   NEUTROABS 4.0 12/14/2021    Imaging:  No results found.  Medications: I have reviewed the patient's current medications.  Assessment/Plan: Moderately differentiated adenocarcinoma of the cecum, stage III (T4a, N1), status post a laparoscopic assisted right colectomy 10/22/2013 The tumor returned microsatellite stable with normal mismatch repair protein expression   Cycle 1 adjuvant Xeloda 12/25/2013.   Cycle 2 adjuvant Xeloda 01/15/2014.   Cycle 3 adjuvant Xeloda 02/05/2014.   Cycle 4 adjuvant  Xeloda 03/18/2014, discontinued after 4 days secondary to diarrhea   Cycle 5 adjuvant Xeloda 04/16/2014, Xeloda dose reduced to 1000 mg in the a.m. and 500 mg in the p.m.   Cycle 6 adjuvant Xeloda 05/07/2014. Cycle 7 adjuvant Xeloda 05/28/2014. Cycle 8 adjuvant Xeloda 06/18/2014. Restaging CTs on 07/14/2014 with no evidence of metastatic colon cancer Colonoscopy 10/02/2014 with diverticulosis in the sigmoid colon and in the descending colon. One 4 mm polyp in the sigmoid colon with complete resection. Polyp tissue not retrieved. Internal hemorrhoids. Next colonoscopy in 3 years for surveillance. Restaging CTs 07/16/2015 with 2 new hypodense liver lesions concerning for metastases, no other evidence of disease progression PET scan 07/28/2015 with a single hypermetabolic right liver lesion, no other evidence of metastatic disease Ultrasound-guided biopsy of the right liver lesion 07/30/2015 confirmed metastatic colon cancer, MSS, tumor mutation burden 1, KRAS G12D, PIK3CA, PTEN MRI abdomen 08/20/2015 with similar to slight decrease in size of 2 hepatic metastasis. No new liver lesions or extrahepatic metastatic disease identified. Radiofrequency ablation of 2 liver lesions 09/25/2015 MRI abdomen 02/02/2016-coagulative necrosis at the 2 ablated liver tumor sites, with no enhancement to suggest residual or recurrent liver tumor. No new liver masses or other findings of metastatic disease in the abdomen. MRI abdomen 08/10/2016 revealed stable ablation sites, no evidence of progressive metastatic disease Restaging CT 02/08/2017-stable treated liver lesions, no evidence of progressive metastatic disease, stable borderline enlarged right inguinal node  CEA with stable mild elevation 02/08/2017, 03/01/2017, 03/29/2017, 04/19/2017 CEA further elevated 08/21/2017 CTs 09/05/2017- possible local recurrence of tumor along the ablation site right hepatic lobe lesion.  Cephalad to the ablation site approximately 1.9 x  2.1 cm soft tissue mass progressive compared to the prior exam. Liver SBRT 10/17/2017, 10/19/2017, 10/23/2017, 10/25/2017, 10/27/2017 CT 02/19/2018- progressive ill-defined areas of low attenuation with enhancement surrounding the segment 6 ablation site, stable segment 8 ablation site enlargement of extracapsular lesion posteriorly, no new hepatic lesions, wall thickening/edema at the distal ileum CT 05/22/2018- 2 new lung nodules suspicious for metastases, stable segment 8 ablation zone, slight decrease in the size of delayed perfusion at the segment 6 ablation zone, posterior extracapsular lesion is slightly smaller CT chest 07/13/2018- multiple lung nodules consistent with metastatic disease Cycle 1 Xeloda 07/23/2018 Cycle 2 Xeloda 08/13/2018 Cycle 3 Xeloda 09/03/2018 Cycle 4 Xeloda 09/24/2018 Restaging chest CT 10/12/2018- scattered pulmonary nodules/metastases mildly improved. Cycle 5 Xeloda 10/15/2018 Cycle 6 Xeloda 11/05/2018 Cycle 7 Xeloda 11/26/2018 Cycle 8 Xeloda 12/17/2018 Cycle 9 Xeloda 01/07/2019 CT chest 01/24/2019- stable pulmonary nodules.  No new pulmonary nodules.  Stable left hepatic lobe cysts and right hepatic lobe lesions.  No new hepatic lesions.  Stable right ninth and 10th rib lesions. Xeloda daily dosing schedule beginning 01/28/2019 due to hand-foot syndrome CT 05/27/2019- enlargement of lung metastases, similar right hepatic lobe lesion CT 08/05/2019-enlargement of bilateral lung nodules, no new lesions, stable right hepatic lesion Cycle 1 FOLFIRI 08/13/2019 Cycle 2 FOLFIRI 09/04/2019, Irinotecan dose reduced due to neutropenia Cycle 3 FOLFIRI 09/17/2019 Cycle 4 FOLFIRI 10/08/2019, 5-FU and irinotecan dose reduced CT chest 10/28/2019-bilateral pulmonary metastases with minimal improvement.  Stable ablation zone in the posterior right hepatic lobe. CT chest 03/03/2020-enlargement of pulmonary metastases, decreased right hepatic ablation site CT chest 07/07/2020-enlargement of pulmonary  metastases, stable ablation site involving right hepatic lobe Cycle 1 FOLFIRI/Avastin 07/27/2020 Cycle 2 FOLFIRI/Avastin 08/18/2020 Treatment held 09/08/2020 due to neutropenia Cycle 3 FOLFIRI/Avastin 09/17/2020 (irinotecan dose reduced due to neutropenia) Cycle 4 FOLFIRI/Avastin 10/06/2020 Chemotherapy discontinued per patient request CT chest 11/16/2020-decrease in size of dominant right perihilar nodule, other lung nodules are stable, stable right liver ablation site Right ovary cystadenofibroma, status post a bilateral oophorectomy 10/22/2013 Remote history of cervical cancer. Iron deficiency anemia-resolved. Right calf deep vein thrombosis 10/24/2013-she completed 3 months of xarelto. Indeterminate 12 mm hypermetabolic right pelvic density on the staging PET scan 10/04/2013-CT followup recommended per the GI tumor conference 11/13/2013. No longer visualized on a CT 07/14/2014. Soft tissue implant overlying the posterior right liver on the CT 16/05/9603-VWU hypermetabolic on the PET scan 98/06/9146. C. difficile colitis 03/05/2014. She completed a course of Flagyl, recurrent diarrhea beginning 03/21/2014, positive C. difficile toxin 03/26/2014-resumed Flagyl for planned 10 day course. Vancomycin beginning 04/08/2014. Taper complete 05/21/2014.   9.  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  Xeloda dose/schedule adjusted beginning 01/28/2019.    Disposition: Cynthia [name] has metastatic colon cancer.  There is no evidence of disease progression.  She has indicated she does not wish to receive further treatment with chemotherapy or radiation.  She is being followed with observation.  Plan to continue the same.  She was hospitalized earlier this month with a stroke.  She is undergoing evaluation for atrial fibrillation.  She will continue to follow-up with neurology.  She will return for flush and follow-up in 6 to 8 weeks.  We are available to see her sooner if  needed.    Ned Card ANP/GNP-BC   01/05/2022  10:46 AM

## 2022-01-06 ENCOUNTER — Encounter: Payer: Self-pay | Admitting: Occupational Therapy

## 2022-01-06 ENCOUNTER — Ambulatory Visit: Payer: Medicare PPO | Admitting: Physical Therapy

## 2022-01-06 ENCOUNTER — Ambulatory Visit: Payer: Medicare PPO | Admitting: Occupational Therapy

## 2022-01-06 DIAGNOSIS — M6281 Muscle weakness (generalized): Secondary | ICD-10-CM | POA: Diagnosis not present

## 2022-01-06 DIAGNOSIS — I69351 Hemiplegia and hemiparesis following cerebral infarction affecting right dominant side: Secondary | ICD-10-CM | POA: Diagnosis not present

## 2022-01-06 DIAGNOSIS — R2689 Other abnormalities of gait and mobility: Secondary | ICD-10-CM | POA: Diagnosis not present

## 2022-01-06 DIAGNOSIS — R2681 Unsteadiness on feet: Secondary | ICD-10-CM

## 2022-01-06 DIAGNOSIS — R278 Other lack of coordination: Secondary | ICD-10-CM | POA: Diagnosis not present

## 2022-01-06 NOTE — Therapy (Signed)
OUTPATIENT PHYSICAL THERAPY NEURO TREATMENT   Patient Name: Cynthia [name] MRN: [id number] DOB:23-Dec-1935, 86 y.o., female Today's Date: 01/06/2022  PCP: Jonathon Jordan, MD REFERRING PROVIDER: Domenic Polite, MD    PT End of Session - 01/06/22 1406     Visit Number 3    Number of Visits 17    Date for PT Re-Evaluation 03/17/22    Authorization Type [employer]    Authorization Time Period Waiting on Auth    Progress Note Due on Visit 10    PT Start Time 1405   Received from OT   PT Stop Time 1448    PT Time Calculation (min) 43 min    Equipment Utilized During Treatment Gait belt    Activity Tolerance Patient tolerated treatment well    Behavior During Therapy [facility] for tasks assessed/performed              Past Medical History:  Diagnosis Date   Anemia    Blood transfusion without reported diagnosis jan 2015   C. difficile colitis 03/05/14, 03/22/14, 04/05/14   recurrent   Cancer ([place])    liver   Cervical cancer ([place]) 1972   Colon cancer ([place]) JAN 2015   Colon cancer ([place])    Complication of anesthesia    slow to awaken in past, DONE WELL RECENTLY   DVT (deep venous thrombosis) ([place]) 10/24/13   Right leg   Guillain-Barre ([place]) 09/2015   Heart murmur    Hypertension    no bp meds    Liver cyst    Stroke ([place]) 12/2014   NUMBNESS ON LEFT SIDE   Tinnitus of both ears ALL THE TIME   Past Surgical History:  Procedure Laterality Date   ABDOMINAL HYSTERECTOMY     vaginal- partial   BACK SURGERY  1981   lower lumbar   ESOPHAGOGASTRODUODENOSCOPY N/A 10/14/2015   Procedure: ESOPHAGOGASTRODUODENOSCOPY (EGD);  Surgeon: Clarene Essex, MD;  [facility] ENDOSCOPY;  Service: Endoscopy;  Laterality: N/A;  Bedside in ICU   IR CV LINE INJECTION  07/20/2020   IR GENERIC HISTORICAL  09/07/2016   IR RADIOLOGIST EVAL & MGMT 09/07/2016 Aletta Edouard, MD [facility] INTERV RAD   IR IMAGING GUIDED PORT INSERTION  07/29/2019   LAPAROSCOPIC RIGHT COLECTOMY Right 10/22/2013    Procedure: LAPAROSCOPIC RIGHT COLECTOMY ;  Surgeon: [name], MD;  Location: [facility] ORS;  Service: General;  Laterality: Right;   SALPINGOOPHORECTOMY  10/22/2013   Procedure: Lesly Dukes OOPHORECTOMY;  Surgeon: [name]. [name], MD;  Location: [facility] ORS;  Service: Gynecology;;   TUBAL LIGATION  1968   Patient Active Problem List   Diagnosis Date Noted   Pulmonary nodules 12/15/2021   Stroke ([place]) 12/14/2021   Port-A-Cath in place 08/13/2019   Goals of care, counseling/discussion 07/22/2019   Clawfoot, acquired, right 10/31/2016   Poor appetite    Neurogenic bladder    Adjustment disorder with mixed anxiety and depressed mood    Quadriplegia and quadriparesis (HCC)    Numbness and tingling    Paresthesias    UTI (lower urinary tract infection)    History of colon cancer    Acute blood loss anemia    Upper GI bleed    GBS (Guillain-Barre syndrome) (HCC)    Aspiration into airway    Endotracheally intubated    Acute respiratory failure ([place])    Shock ([place]) 10/09/2015   Hemoptysis    Transverse myelitis ([place])    Peripheral neuropathy 10/06/2015   Paresthesia of both hands 10/06/2015  Paresthesia of both feet 10/06/2015   Cerebral embolism with cerebral infarction 10/06/2015   Ataxia    Colon carcinoma metastatic to liver ([place]) 09/25/2015   Metastasis to liver ([place]) 08/28/2015   Colon cancer metastasized to liver [facility])    Liver lesion    Cerebral infarction due to unspecified mechanism 01/29/2015   B12 deficiency 01/29/2015   Essential hypertension 01/29/2015   Arm numbness left    Disorientation    Numbness and tingling of left side of face    TIA (transient ischemic attack) 01/11/2015   Confusion    Left arm numbness    DVT, lower extremity, distal ([place]) 10/27/2013   T4a, N0 09/27/2013   Iron deficiency anemia 09/04/2013   Symptomatic anemia 09/03/2013   Orthostasis 09/03/2013   Near syncope 09/03/2013    ONSET DATE: 12/15/2021 (referral)  REFERRING DIAG:  I63.9 (ICD-10-CM) - Acute CVA (cerebrovascular accident) ([place])   THERAPY DIAG:  Muscle weakness (generalized)  Unsteadiness on feet  Other abnormalities of gait and mobility  SUBJECTIVE: No new changes to report, exercises going well.                                                                                                                                                                                             PAIN:  Are you having pain? No    PRECAUTIONS: Fall   OBJECTIVE:   TODAY'S TREATMENT:  Ther Ex Updated and demonstrated HEP (see bolded below) for dynamic BLE strengthening in standing position. Pt encouraged to add ankle weights to exercises to increase challenge.   - Standing March with Counter Support  - 10 reps  - Standing Hip Abduction with ankle weight at Ankles and Counter Support - 10 reps. Min cues to minimize lateral lean throughout   - Standing Hip Extension with ankle weight at Ankles and Counter Support - 10 reps. Min cues to minimize forward compensation lean   - Side Stepping with red Resistance band at Thighs and [employer] - down and back in // bars x2. Mod multimodal cues to maintain space between feet at all times. Noted more difficulty moving to L side compared to R side    - Forward Backward Monster Walk with red Band at Thighs and Counter Support  - down and back in // bars x2. Mod multimodal cues to maintain abduction of BLEs throughout.   Gait Training  Gait pattern:  R lateral trunk lean, step through pattern, and decreased arm swing- Right Distance walked: >200' around clinic Assistive device utilized: None Level of assistance:  HHA on LUE Comments: Practiced ambulating a lap around gym  w/HHA only to improve pt's confidence w/gait and balance. Min cues for increased arm swing of RUE throughout, as pt guarded while walking but did not rely on therapist's hand for balance. Pt reported her R foot "felt heavy" as she fatigued. Noted  mild R lateral lean that therapist corrected w/min tactile cues to pelvis. Lengthy discussion regarding pt practice walking inside home without AD to increase confidence and reduce furniture walking (pt reports she is already walking without AD in home but furniture walking due to need to have something in hands).     PATIENT EDUCATION: Education details: Updated HEP for seated LE strengthening, ambulating in home without AD  Person educated: Patient and Child(ren) Education method: Explanation, Demonstration, Verbal cues, and Handouts Education comprehension: verbalized understanding and needs further education   HOME EXERCISE PROGRAM: Walking Program: given 01/03/22   Begin walking for exercise for 3 minutes, 2 times/day, most days/week.   Progress your walking program by adding 1-2 minutes to your routine each week, as tolerated. Be sure to wear good walking shoes, walk in a safe environment and only progress to your tolerance.    Access Code: [email or web address] URL: [email or web address] Date: 01/06/2022 Prepared by: Mickie Bail Jahsiah Carpenter  Exercises - Sit to Stand Without Arm Support  - 1 x daily - 7 x weekly - 3 sets - 10 reps - Standing March with Counter Support  - 1 x daily - 7 x weekly - 3 sets - 10 reps - Standing Hip Abduction with Resistance at Ankles and Counter Support  - 1 x daily - 7 x weekly - 3 sets - 10 reps - Standing Hip Extension with Resistance at Ankles and Counter Support  - 1 x daily - 7 x weekly - 3 sets - 10 reps - Side Stepping with red Resistance band at Thighs and Counter Support  - 1 x daily - 7 x weekly - 3 sets - 10 reps - Forward Backward Monster Walk with red Band at Thighs and Counter Support  - 1 x daily - 7 x weekly - 3 sets - 10 reps  GOALS: Goals reviewed with patient? Yes  SHORT TERM GOALS: Target date: 01/20/2022     Pt will be independent with initial HEP for improved strength, balance, transfers and gait.  Baseline: initiated on eval Goal  status: INITIAL  2.  Gait velocity will be assessed w/LRAD and STG/LTG written Baseline:  Goal status: INITIAL  3.  Pt will improve Berg score to 42/56 for decreased fall risk  Baseline: 37/56 Goal status: INITIAL  4.  Pt will improve 5 x STS to less than or equal to 20 seconds without UE support to demonstrate improved functional strength and transfer efficiency.   Baseline: 24.34s w/BUE support Goal status: INITIAL  5.  Pt will ambulate 250' on unlevel surface w/S* and LRAD for improved functional mobility and imitation of home environment  Baseline:  Goal status: INITIAL   LONG TERM GOALS: Target date: 02/17/2022     Pt will be independent with final HEP for improved strength, balance, transfers and gait.  Baseline:  Goal status: INITIAL  2. Pt will improve Berg score to 47/56 for decreased fall risk  Baseline: 37/56 Goal status: INITIAL  3.  Gait velocity goal  Baseline:  Goal status: INITIAL  4.  Pt will improve 5 x STS to less than or equal to 15 seconds without UE support to demonstrate improved functional strength and transfer efficiency.   Baseline: 24.34s w/BUE support Goal status:  INITIAL  5.  Pt will ambulate >500' on level/unlevel surface w/LRAD mod I for improved independence with functional mobility  Baseline:  Goal status: INITIAL  ASSESSMENT:  CLINICAL IMPRESSION: Emphasis of skilled PT session on BLE strength and gait training without AD. Updated pt's HEP to include standing exercises for improved standing tolerance, single leg stability and strength in multiple planes of motion. Pt demonstrates fear-avoidance behavior w/gait without AD but no LOB or instability noted w/HHA. Will continue to progress confidence w/gait to improve independence. Continue POC.    OBJECTIVE IMPAIRMENTS Abnormal gait, decreased activity tolerance, decreased balance, decreased endurance, decreased mobility, decreased strength, and impaired sensation.   ACTIVITY  LIMITATIONS community activity, driving, meal prep, laundry, medication management, and yard work.   PERSONAL FACTORS Age, Past/current experiences, Time since onset of injury/illness/exacerbation, and 1 comorbidity: hx of Guillain-Barre  are also affecting patient's functional outcome.    REHAB POTENTIAL: Good  CLINICAL DECISION MAKING: Stable/uncomplicated  EVALUATION COMPLEXITY: Low  PLAN: PT FREQUENCY: 2x/week  PT DURATION: 12 weeks  PLANNED INTERVENTIONS: Therapeutic exercises, Therapeutic activity, Neuromuscular re-education, Balance training, Gait training, Patient/Family education, Stair training, DME instructions, and Aquatic Therapy  PLAN FOR NEXT SESSION: How is HEP? Toe taps to 4" step, gait training without AD, SciFit for endurance, single leg stance, turns, obstacle navigation    Jamee Keach E Alpa [place]vo, PT, DPT 01/06/22, 2:49 PM

## 2022-01-12 ENCOUNTER — Encounter: Payer: Self-pay | Admitting: Occupational Therapy

## 2022-01-12 ENCOUNTER — Ambulatory Visit: Payer: Medicare PPO | Admitting: Occupational Therapy

## 2022-01-12 ENCOUNTER — Ambulatory Visit: Payer: Medicare PPO | Admitting: Physical Therapy

## 2022-01-12 DIAGNOSIS — R2681 Unsteadiness on feet: Secondary | ICD-10-CM

## 2022-01-12 DIAGNOSIS — M6281 Muscle weakness (generalized): Secondary | ICD-10-CM

## 2022-01-12 DIAGNOSIS — I69351 Hemiplegia and hemiparesis following cerebral infarction affecting right dominant side: Secondary | ICD-10-CM

## 2022-01-12 DIAGNOSIS — R2689 Other abnormalities of gait and mobility: Secondary | ICD-10-CM

## 2022-01-12 DIAGNOSIS — R278 Other lack of coordination: Secondary | ICD-10-CM

## 2022-01-12 NOTE — Therapy (Signed)
OUTPATIENT OCCUPATIONAL THERAPY NEURO TREATMENT  Patient Name: Cynthia [name] MRN: [id number] DOB:1935-12-31, 86 y.o., female Today's Date: 01/12/2022  PCP: Dr. Jonathon Jordan REFERRING PROVIDER: Domenic Polite, MD; will follow up with  Dr. Metta Clines (neurology)     OT End of Session - 01/12/22 1549     Visit Number 4    Number of Visits 17    Date for OT Re-Evaluation 02/19/22    Authorization Type [employer]; prior auth required    Authorization - Visit Number 4    Authorization - Number of Visits 10    Progress Note Due on Visit 10    OT Start Time 1320    OT Stop Time 1400    OT Time Calculation (min) 40 min    Activity Tolerance Patient tolerated treatment well    Behavior During Therapy [facility] for tasks assessed/performed                Past Medical History:  Diagnosis Date   Anemia    Blood transfusion without reported diagnosis jan 2015   C. difficile colitis 03/05/14, 03/22/14, 04/05/14   recurrent   Cancer ([place])    liver   Cervical cancer ([place]) 1972   Colon cancer ([place]) JAN 2015   Colon cancer ([place])    Complication of anesthesia    slow to awaken in past, DONE WELL RECENTLY   DVT (deep venous thrombosis) ([place]) 10/24/13   Right leg   Guillain-Barre ([place]) 09/2015   Heart murmur    Hypertension    no bp meds    Liver cyst    Stroke ([place]) 12/2014   NUMBNESS ON LEFT SIDE   Tinnitus of both ears ALL THE TIME   Past Surgical History:  Procedure Laterality Date   ABDOMINAL HYSTERECTOMY     vaginal- partial   BACK SURGERY  1981   lower lumbar   ESOPHAGOGASTRODUODENOSCOPY N/A 10/14/2015   Procedure: ESOPHAGOGASTRODUODENOSCOPY (EGD);  Surgeon: Clarene Essex, MD;  Location: [facility] ENDOSCOPY;  Service: Endoscopy;  Laterality: N/A;  Bedside in ICU   IR CV LINE INJECTION  07/20/2020   IR GENERIC HISTORICAL  09/07/2016   IR RADIOLOGIST EVAL & MGMT 09/07/2016 Aletta Edouard, MD [facility] INTERV RAD   IR IMAGING GUIDED PORT INSERTION  07/29/2019   LAPAROSCOPIC RIGHT  COLECTOMY Right 10/22/2013   Procedure: LAPAROSCOPIC RIGHT COLECTOMY ;  Surgeon: [name], MD;  Location: [facility] ORS;  Service: General;  Laterality: Right;   SALPINGOOPHORECTOMY  10/22/2013   Procedure: Lesly Dukes OOPHORECTOMY;  Surgeon: [name]. [name], MD;  Location: [facility] ORS;  Service: Gynecology;;   TUBAL LIGATION  1968   Patient Active Problem List   Diagnosis Date Noted   Pulmonary nodules 12/15/2021   Stroke ([place]) 12/14/2021   Port-A-Cath in place 08/13/2019   Goals of care, counseling/discussion 07/22/2019   Clawfoot, acquired, right 10/31/2016   Poor appetite    Neurogenic bladder    Adjustment disorder with mixed anxiety and depressed mood    Quadriplegia and quadriparesis (HCC)    Numbness and tingling    Paresthesias    UTI (lower urinary tract infection)    History of colon cancer    Acute blood loss anemia    Upper GI bleed    GBS (Guillain-Barre syndrome) (HCC)    Aspiration into airway    Endotracheally intubated    Acute respiratory failure ([place])    Shock ([place]) 10/09/2015   Hemoptysis    Transverse myelitis ([place])    Peripheral  neuropathy 10/06/2015   Paresthesia of both hands 10/06/2015   Paresthesia of both feet 10/06/2015   Cerebral embolism with cerebral infarction 10/06/2015   Ataxia    Colon carcinoma metastatic to liver ([place]) 09/25/2015   Metastasis to liver ([place]) 08/28/2015   Colon cancer metastasized to liver [facility])    Liver lesion    Cerebral infarction due to unspecified mechanism 01/29/2015   B12 deficiency 01/29/2015   Essential hypertension 01/29/2015   Arm numbness left    Disorientation    Numbness and tingling of left side of face    TIA (transient ischemic attack) 01/11/2015   Confusion    Left arm numbness    DVT, lower extremity, distal ([place]) 10/27/2013   T4a, N0 09/27/2013   Iron deficiency anemia 09/04/2013   Symptomatic anemia 09/03/2013   Orthostasis 09/03/2013   Near syncope 09/03/2013    ONSET DATE: 12/14/21    REFERRING DIAG: I63.9 (ICD-10-CM) - Acute CVA (cerebrovascular accident) ([place])   THERAPY DIAG:  Muscle weakness (generalized)  Other lack of coordination  Hemiplegia and hemiparesis following cerebral infarction affecting right dominant side (HCC)  Other abnormalities of gait and mobility  Unsteadiness on feet  SUBJECTIVE:   SUBJECTIVE STATEMENT: Pt reports she goes to the CA center every 6 weeks  Pt accompanied by: daughter  PERTINENT HISTORY: presented to ED with acute onset of RLE weakness.  MRI showed small acute infarcts of L caudate tail and anterior L centrum semiovale, Wearing heart monitor for 1 month (until 01/22/22)  PMH also includes:  hx of Guillian Barre, hx of CVA  (L numbness 2016), hx of colon cancer s/p colectomy with hepatic metastasis, pulmonary nodules,  tinnitus of both ears, paripheral neuropathy  PRECAUTIONS: Fall, 24 hr supervision, active CA (lung--monitoring)  PAIN:  Are you having pain? No  OBJECTIVE:  TODAY'S TREATMENT:    Completing grooved  with R hand with min difficulty for incr coordination, removing with tweezers for sustained pinch  Standing,to fold laundry with bilateral UE's for incr. Balance and  activity tolerance.   Standing, functional step and reach with each RUE to the side to place graded clothespins with close supervision for balance and min cueing for stepping bigger to target for incr wt. Shift for improved balance to left and right sides  Gripper set at level 2 for sustained grip to pick up 1 inch blocks with RUE, min difficulty  Pt was encouraged to fold laundry seated at home, or to wash dishes with dtr providing supervision.        PATIENT EDUCATION: Education details: n/a Person educated:  Education comprehension   HOME EXERCISE PROGRAM: 01/03/22:  Coordination HEP    GOALS: Potential Goals reviewed with patient? Yes  SHORT TERM GOALS: Target date: 01/18/2022  Pt will be independent with initial  HEP. Goal status: ongoing  2.  Pt will perform BADLs mod I except shower transfer. Goal status:ongoing  3.  Pt will perform simple home maintenance task mod I Goal status: onging  4.  Pt will improve R hand coordination for ADLs as shown improving  completing 9-hole peg test by at least 3sec. Baseline:  26.75sec Goal status: ongoing    LONG TERM GOALS: Target date: 02/15/2022  Pt will be independent with updated HEP. Goal status: INITIAL  2.  Pt will perform simple cooking task mod I. Goal status: INITIAL  3.  Pt will perform shower transfer mod I. Goal status: INITIAL  4.  Pt will be able to plant flowers  in pot mod I. Goal status: INITIAL  5.  Pt will be able to rake with supervision. Goal status: INITIAL    ASSESSMENT:  CLINICAL IMPRESSION: .  Pt is progressing towards goals. She demonstrates improving dynamic balance.  PERFORMANCE DEFICITS in functional skills including ADLs, IADLs, coordination, dexterity, FMC, GMC, mobility, balance, endurance, decreased knowledge of use of DME, and UE functional use, cognitive skills including memory.  IMPAIRMENTS are limiting patient from ADLs, IADLs, and leisure.   COMORBIDITIES  hx of CA (cervical, colon, liver cyst), hx of Guillain-Barre Syndrome (09/2015), HTN, hx of CVA that affects occupational performance. Patient will benefit from skilled OT to address above impairments and improve overall function.  MODIFICATION OR ASSISTANCE TO COMPLETE EVALUATION: Min-Moderate modification of tasks or assist with assess necessary to complete an evaluation.  OT OCCUPATIONAL PROFILE AND HISTORY: Detailed assessment: Review of records and additional review of physical, cognitive, psychosocial history related to current functional performance.  CLINICAL DECISION MAKING: Moderate - several treatment options, min-mod task modification necessary  REHAB POTENTIAL: Good  EVALUATION COMPLEXITY: Moderate    PLAN: OT FREQUENCY:  2x/week  OT DURATION: 8 weeks +eval  PLANNED INTERVENTIONS: self care/ADL training, therapeutic exercise, therapeutic activity, neuromuscular re-education, manual therapy, passive range of motion, balance training, functional mobility training, patient/family education, cognitive remediation/compensation, energy conservation, and DME and/or AE instructions  RECOMMENDED OTHER SERVICES: scheduled for PT eval Thurs.  CONSULTED AND AGREED WITH PLAN OF CARE: Patient and family member/caregiver  PLAN FOR NEXT SESSION:  standing balance for functional tasks, simple IADL   Casimira Sutphin, OT 01/12/2022, 3:49 PM  [facility] [address] [place], [place]  [address] [phone] phone [phone] 01/12/22 3:49 PM

## 2022-01-12 NOTE — Therapy (Signed)
OUTPATIENT PHYSICAL THERAPY NEURO TREATMENT   Patient Name: Cynthia [name] MRN: [id number] DOB:Jan 01, 1936, 86 y.o., female Today's Date: 01/12/2022  PCP: Jonathon Jordan, MD REFERRING PROVIDER: Domenic Polite, MD    PT End of Session - 01/12/22 1234     Visit Number 4    Number of Visits 17    Date for PT Re-Evaluation 03/17/22    Authorization Type [employer]    Authorization Time Period Waiting on Auth    Progress Note Due on Visit 10    PT Start Time 1232    PT Stop Time 1315    PT Time Calculation (min) 43 min    Equipment Utilized During Treatment --    Activity Tolerance Patient tolerated treatment well    Behavior During Therapy [facility] for tasks assessed/performed              Past Medical History:  Diagnosis Date   Anemia    Blood transfusion without reported diagnosis jan 2015   C. difficile colitis 03/05/14, 03/22/14, 04/05/14   recurrent   Cancer ([place])    liver   Cervical cancer ([place]) 1972   Colon cancer ([place]) JAN 2015   Colon cancer ([place])    Complication of anesthesia    slow to awaken in past, DONE WELL RECENTLY   DVT (deep venous thrombosis) ([place]) 10/24/13   Right leg   Guillain-Barre ([place]) 09/2015   Heart murmur    Hypertension    no bp meds    Liver cyst    Stroke ([place]) 12/2014   NUMBNESS ON LEFT SIDE   Tinnitus of both ears ALL THE TIME   Past Surgical History:  Procedure Laterality Date   ABDOMINAL HYSTERECTOMY     vaginal- partial   BACK SURGERY  1981   lower lumbar   ESOPHAGOGASTRODUODENOSCOPY N/A 10/14/2015   Procedure: ESOPHAGOGASTRODUODENOSCOPY (EGD);  Surgeon: Clarene Essex, MD;  Location: [facility] ENDOSCOPY;  Service: Endoscopy;  Laterality: N/A;  Bedside in ICU   IR CV LINE INJECTION  07/20/2020   IR GENERIC HISTORICAL  09/07/2016   IR RADIOLOGIST EVAL & MGMT 09/07/2016 Aletta Edouard, MD [facility] INTERV RAD   IR IMAGING GUIDED PORT INSERTION  07/29/2019   LAPAROSCOPIC RIGHT COLECTOMY Right 10/22/2013   Procedure: LAPAROSCOPIC RIGHT  COLECTOMY ;  Surgeon: [name], MD;  [facility] ORS;  Service: General;  Laterality: Right;   SALPINGOOPHORECTOMY  10/22/2013   Procedure: Lesly Dukes OOPHORECTOMY;  Surgeon: [name]. [name], MD;  [facility]S;  Service: Gynecology;;   TUBAL LIGATION  1968   Patient Active Problem List   Diagnosis Date Noted   Pulmonary nodules 12/15/2021   Stroke ([place]) 12/14/2021   Port-A-Cath in place 08/13/2019   Goals of care, counseling/discussion 07/22/2019   Clawfoot, acquired, right 10/31/2016   Poor appetite    Neurogenic bladder    Adjustment disorder with mixed anxiety and depressed mood    Quadriplegia and quadriparesis (HCC)    Numbness and tingling    Paresthesias    UTI (lower urinary tract infection)    History of colon cancer    Acute blood loss anemia    Upper GI bleed    GBS (Guillain-Barre syndrome) (HCC)    Aspiration into airway    Endotracheally intubated    Acute respiratory failure ([place])    Shock ([place]) 10/09/2015   Hemoptysis    Transverse myelitis ([place])    Peripheral neuropathy 10/06/2015   Paresthesia of both hands 10/06/2015   Paresthesia of both  feet 10/06/2015   Cerebral embolism with cerebral infarction 10/06/2015   Ataxia    Colon carcinoma metastatic to liver ([place]) 09/25/2015   Metastasis to liver ([place]) 08/28/2015   Colon cancer metastasized to liver [facility])    Liver lesion    Cerebral infarction due to unspecified mechanism 01/29/2015   B12 deficiency 01/29/2015   Essential hypertension 01/29/2015   Arm numbness left    Disorientation    Numbness and tingling of left side of face    TIA (transient ischemic attack) 01/11/2015   Confusion    Left arm numbness    DVT, lower extremity, distal ([place]) 10/27/2013   T4a, N0 09/27/2013   Iron deficiency anemia 09/04/2013   Symptomatic anemia 09/03/2013   Orthostasis 09/03/2013   Near syncope 09/03/2013    ONSET DATE: 12/15/2021 (referral)  REFERRING DIAG: I63.9 (ICD-10-CM) - Acute CVA  (cerebrovascular accident) ([place])   THERAPY DIAG:  Muscle weakness (generalized)  Unsteadiness on feet  Other abnormalities of gait and mobility  SUBJECTIVE: States she performed her HEP some this past weekend, used the ankle weights more than the bands. No other changes. Still using cane/walker in her house and furniture walking.                                                                                                                                                                                             PAIN:  Are you having pain? No    PRECAUTIONS: Fall   OBJECTIVE:   TODAY'S TREATMENT:  Ther Ex SciFit level 3 for 7 minutes using BUE/BLEs for dynamic cardiovascular warmup, UE/LE coordination and global strength. Total of 409 steps, RPE of 4/10 following activity.    [facility] PT Assessment - 01/12/22 1246       Ambulation/Gait   Gait velocity 32.8' over 20s = 1.64 ft/s w/RW             Gait Training  Gait pattern:  R lateral trunk lean, step through pattern, decreased stride length, decreased hip/knee flexion- Right, decreased hip/knee flexion- Left, decreased ankle dorsiflexion- Right, and decreased ankle dorsiflexion- Left Distance walked: 150' around clinic and >200' outside on sidewalk  Assistive device utilized: Environmental consultant - 4 wheeled Level of assistance: SBA Comments: Practiced using rollator indoors and outdoors for improved gait speed, kinematics and independence in community. Pt able to teach back proper management of brakes and demonstrate throughout session. Noted improved posture, step length and cadence w/rollator compared to RW. Pt reports improved stability and safety w/use of rollator vs RW, provided pt and daughter with information on how to obtain rollator for home use.  PATIENT EDUCATION: Education details: performing HEP without ankle weights until endurance/strength improves, information on obtaining rollator  Person educated: Patient and  Child(ren) Education method: Explanation, Demonstration, Verbal cues, and Handouts Education comprehension: verbalized understanding and needs further education   HOME EXERCISE PROGRAM: Walking Program: given 01/03/22   Begin walking for exercise for 3 minutes, 2 times/day, most days/week.   Progress your walking program by adding 1-2 minutes to your routine each week, as tolerated. Be sure to wear good walking shoes, walk in a safe environment and only progress to your tolerance.    Access Code: [email or web address] URL: [email or web address] Date: 01/06/2022 Prepared by: Mickie Bail Joanathan Affeldt  Exercises - Sit to Stand Without Arm Support  - 1 x daily - 7 x weekly - 3 sets - 10 reps - Standing March with Counter Support  - 1 x daily - 7 x weekly - 3 sets - 10 reps - Standing Hip Abduction with Resistance at Ankles and Counter Support  - 1 x daily - 7 x weekly - 3 sets - 10 reps - Standing Hip Extension with Resistance at Ankles and Counter Support  - 1 x daily - 7 x weekly - 3 sets - 10 reps - Side Stepping with red Resistance band at Thighs and Counter Support  - 1 x daily - 7 x weekly - 3 sets - 10 reps - Forward Backward Monster Walk with red Band at Thighs and Counter Support  - 1 x daily - 7 x weekly - 3 sets - 10 reps  GOALS: Goals reviewed with patient? Yes  SHORT TERM GOALS: Target date: 01/20/2022     Pt will be independent with initial HEP for improved strength, balance, transfers and gait.  Baseline: initiated on eval Goal status: INITIAL  2.  Pt will improve gait velocity to at least 2.0 ft/s with LRAD for improved gait efficiency   Baseline: 1.64 ft/s w/RW  Goal status: INITIAL  3.  Pt will improve Berg score to 42/56 for decreased fall risk  Baseline: 37/56 Goal status: INITIAL  4.  Pt will improve 5 x STS to less than or equal to 20 seconds without UE support to demonstrate improved functional strength and transfer efficiency.   Baseline: 24.34s w/BUE  support Goal status: INITIAL  5.  Pt will ambulate 250' on unlevel surface w/S* and LRAD for improved functional mobility and imitation of home environment  Baseline:  Goal status: INITIAL   LONG TERM GOALS: Target date: 02/17/2022     Pt will be independent with final HEP for improved strength, balance, transfers and gait.  Baseline:  Goal status: INITIAL  2. Pt will improve Berg score to 47/56 for decreased fall risk  Baseline: 37/56 Goal status: INITIAL  3. Pt will improve gait velocity to at least 2.4 ft/s with LRAD for improved gait efficiency   Baseline: 1.64 ft/s w/RW  Goal status: INITIAL  4.  Pt will improve 5 x STS to less than or equal to 15 seconds without UE support to demonstrate improved functional strength and transfer efficiency.   Baseline: 24.34s w/BUE support Goal status: INITIAL  5.  Pt will ambulate >500' on level/unlevel surface w/LRAD mod I for improved independence with functional mobility  Baseline:  Goal status: INITIAL  ASSESSMENT:  CLINICAL IMPRESSION: Emphasis of skilled PT session on gait training w/rollator. Pt demonstrates slow gait speed w/RW and states she does not feel balanced with it outdoors or when playing w/grandkids. Trialed rollator for improved stability on unlevel surfaces  and confidence w/balance for improved gait speed. Pt very satisfied w/rollator, provided information to daughter and pt on how to obtain one. Continue POC.    OBJECTIVE IMPAIRMENTS Abnormal gait, decreased activity tolerance, decreased balance, decreased endurance, decreased mobility, decreased strength, and impaired sensation.   ACTIVITY LIMITATIONS community activity, driving, meal prep, laundry, medication management, and yard work.   PERSONAL FACTORS Age, Past/current experiences, Time since onset of injury/illness/exacerbation, and 1 comorbidity: hx of Guillain-Barre  are also affecting patient's functional outcome.    REHAB POTENTIAL: Good  CLINICAL  DECISION MAKING: Stable/uncomplicated  EVALUATION COMPLEXITY: Low  PLAN: PT FREQUENCY: 2x/week  PT DURATION: 12 weeks  PLANNED INTERVENTIONS: Therapeutic exercises, Therapeutic activity, Neuromuscular re-education, Balance training, Gait training, Patient/Family education, Stair training, DME instructions, and Aquatic Therapy  PLAN FOR NEXT SESSION: How is HEP? Stair training, Toe taps to 4" step, gait training with rollator and without AD, rollator on grass/hills, SciFit for endurance, single leg stance, turns, obstacle navigation    Lacoya Wilbanks E Tristyn Demarest, PT, DPT 01/12/22, 1:20 PM

## 2022-01-14 ENCOUNTER — Ambulatory Visit: Payer: Medicare PPO | Attending: Internal Medicine | Admitting: Occupational Therapy

## 2022-01-14 ENCOUNTER — Ambulatory Visit: Payer: Medicare PPO | Admitting: Physical Therapy

## 2022-01-14 DIAGNOSIS — R278 Other lack of coordination: Secondary | ICD-10-CM | POA: Insufficient documentation

## 2022-01-14 DIAGNOSIS — I69351 Hemiplegia and hemiparesis following cerebral infarction affecting right dominant side: Secondary | ICD-10-CM | POA: Diagnosis not present

## 2022-01-14 DIAGNOSIS — R2681 Unsteadiness on feet: Secondary | ICD-10-CM | POA: Diagnosis not present

## 2022-01-14 DIAGNOSIS — M6281 Muscle weakness (generalized): Secondary | ICD-10-CM | POA: Diagnosis not present

## 2022-01-14 DIAGNOSIS — R2689 Other abnormalities of gait and mobility: Secondary | ICD-10-CM | POA: Diagnosis not present

## 2022-01-14 NOTE — Therapy (Signed)
OUTPATIENT OCCUPATIONAL THERAPY NEURO TREATMENT  Patient Name: Cynthia [name] MRN: [id number] DOB:04-14-1936, 86 y.o., female Today's Date: 01/14/2022  PCP: Dr. Jonathon Jordan REFERRING PROVIDER: Domenic Polite, MD; will follow up with  Dr. Metta Clines (neurology)     OT End of Session - 01/14/22 1504     Visit Number 5    Number of Visits 17    Date for OT Re-Evaluation 02/19/22    Authorization Type [employer]; prior auth required    Authorization - Visit Number 5    Authorization - Number of Visits 10    Progress Note Due on Visit 10    OT Start Time 1020    OT Stop Time 1100    OT Time Calculation (min) 40 min                 Past Medical History:  Diagnosis Date   Anemia    Blood transfusion without reported diagnosis jan 2015   C. difficile colitis 03/05/14, 03/22/14, 04/05/14   recurrent   Cancer ([place])    liver   Cervical cancer ([place]) 1972   Colon cancer ([place]) JAN 2015   Colon cancer ([place])    Complication of anesthesia    slow to awaken in past, DONE WELL RECENTLY   DVT (deep venous thrombosis) ([place]) 10/24/13   Right leg   Guillain-Barre ([place]) 09/2015   Heart murmur    Hypertension    no bp meds    Liver cyst    Stroke ([place]) 12/2014   NUMBNESS ON LEFT SIDE   Tinnitus of both ears ALL THE TIME   Past Surgical History:  Procedure Laterality Date   ABDOMINAL HYSTERECTOMY     vaginal- partial   BACK SURGERY  1981   lower lumbar   ESOPHAGOGASTRODUODENOSCOPY N/A 10/14/2015   Procedure: ESOPHAGOGASTRODUODENOSCOPY (EGD);  Surgeon: Clarene Essex, MD;  Location: [facility] ENDOSCOPY;  Service: Endoscopy;  Laterality: N/A;  Bedside in ICU   IR CV LINE INJECTION  07/20/2020   IR GENERIC HISTORICAL  09/07/2016   IR RADIOLOGIST EVAL & MGMT 09/07/2016 Aletta Edouard, MD [facility] INTERV RAD   IR IMAGING GUIDED PORT INSERTION  07/29/2019   LAPAROSCOPIC RIGHT COLECTOMY Right 10/22/2013   Procedure: LAPAROSCOPIC RIGHT COLECTOMY ;  Surgeon: [name], MD;  Location: [facility]  ORS;  Service: General;  Laterality: Right;   SALPINGOOPHORECTOMY  10/22/2013   Procedure: Lesly Dukes OOPHORECTOMY;  Surgeon: [name]. [name], MD;  Location: [facility] ORS;  Service: Gynecology;;   TUBAL LIGATION  1968   Patient Active Problem List   Diagnosis Date Noted   Pulmonary nodules 12/15/2021   Stroke ([place]) 12/14/2021   Port-A-Cath in place 08/13/2019   Goals of care, counseling/discussion 07/22/2019   Clawfoot, acquired, right 10/31/2016   Poor appetite    Neurogenic bladder    Adjustment disorder with mixed anxiety and depressed mood    Quadriplegia and quadriparesis (HCC)    Numbness and tingling    Paresthesias    UTI (lower urinary tract infection)    History of colon cancer    Acute blood loss anemia    Upper GI bleed    GBS (Guillain-Barre syndrome) (HCC)    Aspiration into airway    Endotracheally intubated    Acute respiratory failure ([place])    Shock ([place]) 10/09/2015   Hemoptysis    Transverse myelitis ([place])    Peripheral neuropathy 10/06/2015   Paresthesia of both hands 10/06/2015   Paresthesia of both feet 10/06/2015  Cerebral embolism with cerebral infarction 10/06/2015   Ataxia    Colon carcinoma metastatic to liver ([place]) 09/25/2015   Metastasis to liver ([place]) 08/28/2015   Colon cancer metastasized to liver [facility])    Liver lesion    Cerebral infarction due to unspecified mechanism 01/29/2015   B12 deficiency 01/29/2015   Essential hypertension 01/29/2015   Arm numbness left    Disorientation    Numbness and tingling of left side of face    TIA (transient ischemic attack) 01/11/2015   Confusion    Left arm numbness    DVT, lower extremity, distal ([place]) 10/27/2013   T4a, N0 09/27/2013   Iron deficiency anemia 09/04/2013   Symptomatic anemia 09/03/2013   Orthostasis 09/03/2013   Near syncope 09/03/2013    ONSET DATE: 12/14/21   REFERRING DIAG: I63.9 (ICD-10-CM) - Acute CVA (cerebrovascular accident) ([place])   THERAPY DIAG:  Muscle weakness  (generalized)  Other lack of coordination  Hemiplegia and hemiparesis following cerebral infarction affecting right dominant side (HCC)  Other abnormalities of gait and mobility  Unsteadiness on feet  SUBJECTIVE:   SUBJECTIVE STATEMENT: Pt reports she goes to the CA center every 6 weeks  Pt accompanied by: daughter  PERTINENT HISTORY: presented to ED with acute onset of RLE weakness.  MRI showed small acute infarcts of L caudate tail and anterior L centrum semiovale, Wearing heart monitor for 1 month (until 01/22/22)  PMH also includes:  hx of Guillian Barre, hx of CVA  (L numbness 2016), hx of colon cancer s/p colectomy with hepatic metastasis, pulmonary nodules,  tinnitus of both ears, paripheral neuropathy  PRECAUTIONS: Fall, 24 hr supervision, active CA (lung--monitoring)  PAIN:  Are you having pain? No  OBJECTIVE:  TODAY'S TREATMENT:    Standing to place washers on various targets with LUE, supervision for balance. Pt practiced donning/ doffing shoe and sock by crossing ankle over knee, min v.c Simple cooking task to retrieve items and scramble an egg. Pt required close supervision and mod v.c for rollator safety. Pt turned off the stove without cueing. Pt required min v.c to locate items, pt does not appear to be scanning effectively.. Recommended pt practices at home only with direct supervision / assistance.P      PATIENT EDUCATION: Education details: n/a Person educated:  Education comprehension   HOME EXERCISE PROGRAM: 01/03/22:  Coordination HEP    GOALS: Potential Goals reviewed with patient? Yes  SHORT TERM GOALS: Target date: 01/18/2022  Pt will be independent with initial HEP. Goal status: ongoing  2.  Pt will perform BADLs mod I except shower transfer. Goal status:ongoing  3.  Pt will perform simple home maintenance task mod I Goal status: onging  4.  Pt will improve R hand coordination for ADLs as shown improving  completing 9-hole peg test  by at least 3sec. Baseline:  26.75sec Goal status: ongoing    LONG TERM GOALS: Target date: 02/15/2022  Pt will be independent with updated HEP. Goal status: INITIAL  2.  Pt will perform simple cooking task mod I. Goal status: INITIAL  3.  Pt will perform shower transfer mod I. Goal status: INITIAL  4.  Pt will be able to plant flowers in pot mod I. Goal status: INITIAL  5.  Pt will be able to rake with supervision. Goal status: INITIAL    ASSESSMENT:  CLINICAL IMPRESSION: .  Pt is progressing towards goals. She demonstrates improving dynamic balance, but needs reinforcement of rollator use.  PERFORMANCE DEFICITS in functional skills  including ADLs, IADLs, coordination, dexterity, [place], GMC, mobility, balance, endurance, decreased knowledge of use of DME, and UE functional use, cognitive skills including memory.  IMPAIRMENTS are limiting patient from ADLs, IADLs, and leisure.   COMORBIDITIES  hx of CA (cervical, colon, liver cyst), hx of Guillain-Barre Syndrome (09/2015), HTN, hx of CVA that affects occupational performance. Patient will benefit from skilled OT to address above impairments and improve overall function.  MODIFICATION OR ASSISTANCE TO COMPLETE EVALUATION: Min-Moderate modification of tasks or assist with assess necessary to complete an evaluation.  OT OCCUPATIONAL PROFILE AND HISTORY: Detailed assessment: Review of records and additional review of physical, cognitive, psychosocial history related to current functional performance.  CLINICAL DECISION MAKING: Moderate - several treatment options, min-mod task modification necessary  REHAB POTENTIAL: Good  EVALUATION COMPLEXITY: Moderate    PLAN: OT FREQUENCY: 2x/week  OT DURATION: 8 weeks +eval  PLANNED INTERVENTIONS: self care/ADL training, therapeutic exercise, therapeutic activity, neuromuscular re-education, manual therapy, passive range of motion, balance training, functional mobility training,  patient/family education, cognitive remediation/compensation, energy conservation, and DME and/or AE instructions  RECOMMENDED OTHER SERVICES: scheduled for PT eval Thurs.  CONSULTED AND AGREED WITH PLAN OF CARE: Patient and family member/caregiver  PLAN FOR NEXT SESSION:  standing balance for functional tasks, simple IADL   Rica Heather, OT 01/14/2022, 3:05 PM  [facility] [place], [place]  [id number] [phone] phone [phone] 01/14/22 3:05 PM

## 2022-01-16 NOTE — Progress Notes (Unsigned)
NEUROLOGY CONSULTATION NOTE  [name] MRN: [id number] DOB: 10-19-1935  Referring provider: Rosalin Hawking, MD (hospital referral) Primary care provider: Jonathon Jordan, MD  Reason for consult:  stroke  Assessment/Plan:   Lft MCA infarcts within left caudate tail and left centrum semiovale, likely secondary to small vessel disease but given involvement of two locations, would evaluate further for cardioembolic source. Hypertension Hyperlipidemia   1  Secondary stroke prevention: ASA '81mg'$  daily Atorvastatin '40mg'$  daily.  LDL goal less than 70 - recheck lipid panel in 2 months. Normotensive blood pressure Hgb A1c goal less than 7 2. Continue 30 day cardiac event monitor.  3  Mediterranean diet 4   Routine exercise 5  Follow up in 6 months.  Subjective:  Cynthia [name] is an 86 year old female with HTN and history of DVT, colon cancer with hepatic metastasis and cervical cancer who presents for stroke.  History supplemented by hospital records.CT head, MRI and MRA of head personally reviewed. She is accompanied by her daughter who supplements history.     On 12/14/2021, she developed acute onset right leg and hand weakness.  Admitted to [facility] where CT head revealed no acute abnormality but MRI showed small acute infarcts in the left caudate tail and left centrum semiovale.  MRA of head showed mild left vertebral artery distal stenosis but otherwise unremarkable.  Carotid ultrasound was unremarkable.  TTE showed EF 60-65% with grade 1 diastolic dysfunction but no no atrial level shunt.  LDL was 141 and Hgb A1c was 5.7.  She was not on antithrombotic therapy prior to admission.  She was discharged on ASA '81mg'$  and Plavix '75mg'$  daily for 3 weeks followed by ASA monotherapy.  She was also started on atorvastatin '40mg'$  daily.  Undergoing outpatient PT/OT.  She no longer drags the right foot.  Still has difficulty using the right hand such as writing.  Still feels numbness  in the left hand from the first stroke.  Currently wearing a 30 day event monitor.      PAST MEDICAL HISTORY: Past Medical History:  Diagnosis Date   Anemia    Blood transfusion without reported diagnosis jan 2015   C. difficile colitis 03/05/14, 03/22/14, 04/05/14   recurrent   Cancer ([place])    liver   Cervical cancer ([place]) 1972   Colon cancer ([place]) JAN 2015   Colon cancer ([place])    Complication of anesthesia    slow to awaken in past, DONE WELL RECENTLY   DVT (deep venous thrombosis) ([place]) 10/24/13   Right leg   Guillain-Barre ([place]) 09/2015   Heart murmur    Hypertension    no bp meds    Liver cyst    Stroke ([place]) 12/2014   NUMBNESS ON LEFT SIDE   Tinnitus of both ears ALL THE TIME    PAST SURGICAL HISTORY: Past Surgical History:  Procedure Laterality Date   ABDOMINAL HYSTERECTOMY     vaginal- partial   BACK SURGERY  1981   lower lumbar   ESOPHAGOGASTRODUODENOSCOPY N/A 10/14/2015   Procedure: ESOPHAGOGASTRODUODENOSCOPY (EGD);  Surgeon: Clarene Essex, MD;  Location: [facility] ENDOSCOPY;  Service: Endoscopy;  Laterality: N/A;  Bedside in ICU   IR CV LINE INJECTION  07/20/2020   IR GENERIC HISTORICAL  09/07/2016   IR RADIOLOGIST EVAL & MGMT 09/07/2016 Aletta Edouard, MD [facility] INTERV RAD   IR IMAGING GUIDED PORT INSERTION  07/29/2019   LAPAROSCOPIC RIGHT COLECTOMY Right 10/22/2013   Procedure: LAPAROSCOPIC RIGHT COLECTOMY ;  Surgeon: [name], MD;  Location[facility] ORS;  Service: General;  Laterality: Right;   SALPINGOOPHORECTOMY  10/22/2013   Procedure: [place] Saa;  Surgeon: [name]. [name], MD;  Location: [facility] ORS;  Service: Gynecology;;   TUBAL LIGATION  1968    MEDICATIONS: Current Outpatient Medications on File Prior to Visit  Medication Sig Dispense Refill   acetaminophen (TYLENOL) 500 MG tablet Take 1,000 mg by mouth every 6 (six) hours as needed for mild pain.      aspirin EC 81 MG EC tablet Take 1 tablet (81 mg total) by mouth daily. Swallow whole. 30 tablet  11   atorvastatin (LIPITOR) 40 MG tablet Take 1 tablet (40 mg total) by mouth daily. 30 tablet 0   Cholecalciferol (VITAMIN D3) 25 MCG (1000 UT) CAPS 1 capsule     gabapentin (NEURONTIN) 400 MG capsule TAKE 1 CAPSULE BY MOUTH THREE TIMES DAILY. MAY TAKE ADDITIONAL DOSE AT NIGHT AS NEEDED 100 capsule 5   lidocaine-prilocaine (EMLA) cream Apply 1 application topically as directed. Apply to port site 1 hour prior to stick and cover with plastic wrap 30 g 2   loperamide (IMODIUM) 2 MG capsule Take 2-4 mg by mouth as needed for diarrhea or loose stools.     metoprolol succinate (TOPROL-XL) 50 MG 24 hr tablet Take 100 mg by mouth daily. Take with or immediately following a meal.     vitamin B-12 (CYANOCOBALAMIN) 1000 MCG tablet 1 tablet     Current Facility-Administered Medications on File Prior to Visit  Medication Dose Route Frequency Provider Last Rate Last Admin   heparin lock flush 100 unit/mL  500 Units Intracatheter Once PRN Ladell Pier, MD       sodium chloride flush (NS) 0.9 % injection 10 mL  10 mL Intracatheter PRN Ladell Pier, MD       sodium chloride flush (NS) 0.9 % injection 10 mL  10 mL Intracatheter PRN Ladell Pier, MD   10 mL at 05/24/21 5053    ALLERGIES: Allergies  Allergen Reactions   Morphine And Related Other (See Comments)    Patient has hallucinations    FAMILY HISTORY: Family History  Problem Relation Age of Onset   Diabetes Sister    Cancer Sister        leukemia   Diabetes Brother    Heart failure Brother    Hypertension Brother    Rheum arthritis Mother    Heart failure Father    Breast cancer Maternal Aunt    Cancer Maternal Grandmother        GIST   Heart failure Maternal Grandfather    Aneurysm Paternal Grandmother     Objective:  Blood pressure (!) 159/77, pulse 62, height 5' (1.524 m), weight 123 lb (55.8 kg), SpO2 97 %. General: No acute distress.  Patient appears well-groomed.   Head:  Normocephalic/atraumatic Eyes:  fundi  examined but not visualized Heart: regular rate and rhythm Neurological Exam: Mental status: alert and oriented to person, place, and time, recent and remote memory intact, fund of knowledge intact, attention and concentration intact, speech fluent and not dysarthric, language intact. Cranial nerves: CN I: not tested CN II: pupils equal, round and reactive to light, visual fields intact CN III, IV, VI:  full range of motion, no nystagmus, no ptosis CN V: facial sensation intact. CN VII: upper and lower face symmetric CN VIII: hearing intact CN IX, X: gag intact, uvula midline CN XI: sternocleidomastoid and trapezius muscles intact  CN XII: tongue midline Bulk & Tone: normal, no fasciculations. Motor:  muscle strength 5/5 throughout Sensation:  Temperature sensation intact.  Vibratory sensation reduced in feet. Deep Tendon Reflexes:  2+ throughout,  toes downgoing.   Finger to nose testing:  Without dysmetria.   Heel to shin:  Without dysmetria.   Gait:  Slight right hemiparetic gait.  Ambulates with walker.  Romberg negative.    Thank you for allowing me to take part in the care of this patient.  Metta Clines, DO  CC:  Jonathon Jordan, MD

## 2022-01-17 ENCOUNTER — Ambulatory Visit: Payer: Medicare PPO | Admitting: Neurology

## 2022-01-17 ENCOUNTER — Encounter: Payer: Self-pay | Admitting: Neurology

## 2022-01-17 VITALS — BP 159/77 | HR 62 | Ht 60.0 in | Wt 123.0 lb

## 2022-01-17 DIAGNOSIS — I10 Essential (primary) hypertension: Secondary | ICD-10-CM

## 2022-01-17 DIAGNOSIS — E785 Hyperlipidemia, unspecified: Secondary | ICD-10-CM | POA: Diagnosis not present

## 2022-01-17 DIAGNOSIS — I6389 Other cerebral infarction: Secondary | ICD-10-CM

## 2022-01-17 MED ORDER — ATORVASTATIN CALCIUM 40 MG PO TABS: 40.0000 mg | ORAL_TABLET | Freq: Every day | ORAL | 2 refills | Status: DC

## 2022-01-17 NOTE — Therapy (Signed)
OUTPATIENT OCCUPATIONAL THERAPY NEURO TREATMENT  Patient Name: Cynthia [name] MRN: [id number] DOB:August 28, 1935, 86 y.o., female Today's Date: 01/18/2022  PCP: Dr. Jonathon Jordan REFERRING PROVIDER: Domenic Polite, MD; will follow up with  Dr. Metta Clines (neurology)     OT End of Session - 01/18/22 1320     Visit Number 6    Number of Visits 17    Date for OT Re-Evaluation 02/19/22    Authorization Type [employer]; prior auth required    Authorization - Visit Number 6    Authorization - Number of Visits 10    Progress Note Due on Visit 10    OT Start Time 8916    OT Stop Time 1400    OT Time Calculation (min) 38 min                  Past Medical History:  Diagnosis Date   Anemia    Blood transfusion without reported diagnosis jan 2015   C. difficile colitis 03/05/14, 03/22/14, 04/05/14   recurrent   Cancer ([place])    liver   Cervical cancer ([place]) 1972   Colon cancer ([place]) JAN 2015   Colon cancer ([place])    Complication of anesthesia    slow to awaken in past, DONE WELL RECENTLY   DVT (deep venous thrombosis) ([place]) 10/24/13   Right leg   Guillain-Barre ([place]) 09/2015   Heart murmur    Hypertension    no bp meds    Liver cyst    Stroke ([place]) 12/2014   NUMBNESS ON LEFT SIDE   Tinnitus of both ears ALL THE TIME   Past Surgical History:  Procedure Laterality Date   ABDOMINAL HYSTERECTOMY     vaginal- partial   BACK SURGERY  1981   lower lumbar   ESOPHAGOGASTRODUODENOSCOPY N/A 10/14/2015   Procedure: ESOPHAGOGASTRODUODENOSCOPY (EGD);  Surgeon: Clarene Essex, MD;  Location: [facility] ENDOSCOPY;  Service: Endoscopy;  Laterality: N/A;  Bedside in ICU   IR CV LINE INJECTION  07/20/2020   IR GENERIC HISTORICAL  09/07/2016   IR RADIOLOGIST EVAL & MGMT 09/07/2016 Aletta Edouard, MD [facility] INTERV RAD   IR IMAGING GUIDED PORT INSERTION  07/29/2019   LAPAROSCOPIC RIGHT COLECTOMY Right 10/22/2013   Procedure: LAPAROSCOPIC RIGHT COLECTOMY ;  Surgeon: [name], MD;  Location:  [facility] ORS;  Service: General;  Laterality: Right;   SALPINGOOPHORECTOMY  10/22/2013   Procedure: Lesly Dukes OOPHORECTOMY;  Surgeon: [name]. [name], MD;  Location: [facility] ORS;  Service: Gynecology;;   TUBAL LIGATION  1968   Patient Active Problem List   Diagnosis Date Noted   Pulmonary nodules 12/15/2021   Stroke ([place]) 12/14/2021   Port-A-Cath in place 08/13/2019   Goals of care, counseling/discussion 07/22/2019   [place]foot, acquired, right 10/31/2016   Poor appetite    Neurogenic bladder    Adjustment disorder with mixed anxiety and depressed mood    Quadriplegia and quadriparesis (HCC)    Numbness and tingling    Paresthesias    UTI (lower urinary tract infection)    History of colon cancer    Acute blood loss anemia    Upper GI bleed    GBS (Guillain-Barre syndrome) (HCC)    Aspiration into airway    Endotracheally intubated    Acute respiratory failure ([place])    Shock ([place]) 10/09/2015   Hemoptysis    Transverse myelitis ([place])    Peripheral neuropathy 10/06/2015   Paresthesia of both hands 10/06/2015   Paresthesia of both feet 10/06/2015  Cerebral embolism with cerebral infarction 10/06/2015   Ataxia    Colon carcinoma metastatic to liver ([place]) 09/25/2015   Metastasis to liver ([place]) 08/28/2015   Colon cancer metastasized to liver [facility])    Liver lesion    Cerebral infarction due to unspecified mechanism 01/29/2015   B12 deficiency 01/29/2015   Essential hypertension 01/29/2015   Arm numbness left    Disorientation    Numbness and tingling of left side of face    TIA (transient ischemic attack) 01/11/2015   Confusion    Left arm numbness    DVT, lower extremity, distal ([place]) 10/27/2013   T4a, N0 09/27/2013   Iron deficiency anemia 09/04/2013   Symptomatic anemia 09/03/2013   Orthostasis 09/03/2013   Near syncope 09/03/2013    ONSET DATE: 12/14/21   REFERRING DIAG: I63.9 (ICD-10-CM) - Acute CVA (cerebrovascular accident) ([place])   THERAPY DIAG:  Muscle weakness  (generalized)  Hemiplegia and hemiparesis following cerebral infarction affecting right dominant side (HCC)  Other lack of coordination  Unsteadiness on feet  SUBJECTIVE:   SUBJECTIVE STATEMENT: Pt has had rollator approx 1 week... I'm learning.  Pt reports that she boiled an egg, made toast, and made coffee.  This afternoon, I'm going to help make oatmeal cookies.  I just don't have much energy.  I wrote 2 checks.  Pt accompanied by: daughter   PERTINENT HISTORY: presented to ED with acute onset of RLE weakness.  MRI showed small acute infarcts of L caudate tail and anterior L centrum semiovale, Wearing heart monitor for 1 month (until 01/22/22)  PMH also includes:  hx of Guillian Barre, hx of CVA  (L numbness 2016), hx of colon cancer s/p colectomy with hepatic metastasis, pulmonary nodules,  tinnitus of both ears, paripheral neuropathy  PRECAUTIONS: Fall, 24 hr supervision, active CA (lung--monitoring)  PAIN:  Are you having pain? No  OBJECTIVE:  TODAY'S TREATMENT:   Standing, performing functional reaching to place/remove cones from lower shelf of lower cabinet and overhead shelf to simulate putting dishes/groceries away with supervision for balance, UE support on counter.  Standing, stepping over 4" object with each LE with functional reach for incr dynamic balance with close supervision, UE support on counter.  Placing [place] pegs in pegboard with tweezers with R hand with min difficulty.  In standing, Placing small pegs in vertical pegboard to copy design with RUE for incr coordination and balance with supervision and UE support.  Began checking goals and discussing progress--see below    PATIENT EDUCATION: Education details: recommended pt begin to incr activity in light IADL tasks (folding clothes, simple kitchen tasks, wiping counters) Person educated:  pt, dtr Education method:  explanation Education comprehension:  verbalized understanding   HOME EXERCISE  PROGRAM: 01/03/22:  Coordination HEP    GOALS: Potential Goals reviewed with patient? Yes  SHORT TERM GOALS: Target date: 01/18/2022  Pt will be independent with initial HEP. Goal status: MET  2.  Pt will perform BADLs mod I except shower transfer. Goal status:  MET 01/18/22  3.  Pt will perform simple home maintenance task mod I.  Goal status:  ONGOING  01/18/22:  Pt reports that she has washed some dishes, but not performing other tasks.  4.  Pt will improve R hand coordination for ADLs as shown improving  completing 9-hole peg test by at least 3sec. Baseline:  26.75sec Goal status: ongoing    LONG TERM GOALS: Target date: 02/15/2022  Pt will be independent with updated HEP. Goal status: INITIAL  2.  Pt will perform simple cooking task mod I. Goal status: INITIAL  3.  Pt will perform shower transfer mod I. Goal status: INITIAL  4.  Pt will be able to plant flowers in pot mod I. Goal status: INITIAL  5.  Pt will be able to rake with supervision. Goal status: INITIAL    ASSESSMENT:  CLINICAL IMPRESSION: Pt is progressing towards goals. She demonstrates improving balance and coordination.  PERFORMANCE DEFICITS in functional skills including ADLs, IADLs, coordination, dexterity, FMC, GMC, mobility, balance, endurance, decreased knowledge of use of DME, and UE functional use, cognitive skills including memory.  IMPAIRMENTS are limiting patient from ADLs, IADLs, and leisure.   COMORBIDITIES  hx of CA (cervical, colon, liver cyst), hx of Guillain-Barre Syndrome (09/2015), HTN, hx of CVA that affects occupational performance. Patient will benefit from skilled OT to address above impairments and improve overall function.  MODIFICATION OR ASSISTANCE TO COMPLETE EVALUATION: Min-Moderate modification of tasks or assist with assess necessary to complete an evaluation.  OT OCCUPATIONAL PROFILE AND HISTORY: Detailed assessment: Review of records and additional review of physical,  cognitive, psychosocial history related to current functional performance.  CLINICAL DECISION MAKING: Moderate - several treatment options, min-mod task modification necessary  REHAB POTENTIAL: Good  EVALUATION COMPLEXITY: Moderate    PLAN: OT FREQUENCY: 2x/week  OT DURATION: 8 weeks +eval  PLANNED INTERVENTIONS: self care/ADL training, therapeutic exercise, therapeutic activity, neuromuscular re-education, manual therapy, passive range of motion, balance training, functional mobility training, patient/family education, cognitive remediation/compensation, energy conservation, and DME and/or AE instructions  RECOMMENDED OTHER SERVICES: scheduled for PT eval Thurs.  CONSULTED AND AGREED WITH PLAN OF CARE: Patient and family member/caregiver  PLAN FOR NEXT SESSION:  check remaining STGs; standing balance for functional tasks, simple IADL task   [facility], [place] 01/18/2022, 2:14 PM  [facility] [address] [place], [place]  [address] [phone] phone [phone] 01/18/22 2:14 PM

## 2022-01-17 NOTE — Patient Instructions (Signed)
Continue aspirin '81mg'$  daily Continue atorvastatin '40mg'$  daily.  Repeat lipid panel in 2 months. Mediterranean diet Routine exercise - continue physical and occupational therapy Continue wearing heart monitor Follow up 6 months.   Mediterranean Diet A Mediterranean diet refers to food and lifestyle choices that are based on the traditions of countries located on the [employer]. It focuses on eating more fruits, vegetables, whole grains, beans, nuts, seeds, and heart-healthy fats, and eating less dairy, meat, eggs, and processed foods with added sugar, salt, and fat. This way of eating has been shown to help prevent certain conditions and improve outcomes for people who have chronic diseases, like kidney disease and heart disease. What are tips for following this plan? Reading food labels Check the serving size of packaged foods. For foods such as rice and pasta, the serving size refers to the amount of cooked product, not dry. Check the total fat in packaged foods. Avoid foods that have saturated fat or trans fats. Check the ingredient list for added sugars, such as corn syrup. Shopping  Buy a variety of foods that offer a balanced diet, including: Fresh fruits and vegetables (produce). Grains, beans, nuts, and seeds. Some of these may be available in unpackaged forms or large amounts (in bulk). Fresh seafood. Poultry and eggs. Low-fat dairy products. Buy whole ingredients instead of prepackaged foods. Buy fresh fruits and vegetables in-season from local farmers markets. Buy plain frozen fruits and vegetables. If you do not have access to quality fresh seafood, buy precooked frozen shrimp or canned fish, such as tuna, salmon, or sardines. Stock your pantry so you always have certain foods on hand, such as olive oil, canned tuna, canned tomatoes, rice, pasta, and beans. Cooking Cook foods with extra-virgin olive oil instead of using butter or other vegetable oils. Have meat as a  side dish, and have vegetables or grains as your main dish. This means having meat in small portions or adding small amounts of meat to foods like pasta or stew. Use beans or vegetables instead of meat in common dishes like chili or lasagna. Experiment with different cooking methods. Try roasting, broiling, steaming, and sauting vegetables. Add frozen vegetables to soups, stews, pasta, or rice. Add nuts or seeds for added healthy fats and plant protein at each meal. You can add these to yogurt, salads, or vegetable dishes. Marinate fish or vegetables using olive oil, lemon juice, garlic, and fresh herbs. Meal planning Plan to eat one vegetarian meal one day each week. Try to work up to two vegetarian meals, if possible. Eat seafood two or more times a week. Have healthy snacks readily available, such as: Vegetable sticks with hummus. Greek yogurt. Fruit and nut trail mix. Eat balanced meals throughout the week. This includes: Fruit: 2-3 servings a day. Vegetables: 4-5 servings a day. Low-fat dairy: 2 servings a day. Fish, poultry, or lean meat: 1 serving a day. Beans and legumes: 2 or more servings a week. Nuts and seeds: 1-2 servings a day. Whole grains: 6-8 servings a day. Extra-virgin olive oil: 3-4 servings a day. Limit red meat and sweets to only a few servings a month. Lifestyle  Cook and eat meals together with your family, when possible. Drink enough fluid to keep your urine pale yellow. Be physically active every day. This includes: Aerobic exercise like running or swimming. Leisure activities like gardening, walking, or housework. Get 7-8 hours of sleep each night. If recommended by your health care provider, drink red wine in moderation. This means 1  glass a day for nonpregnant women and 2 glasses a day for men. A glass of wine equals 5 oz (150 mL). What foods should I eat? Fruits Apples. Apricots. Avocado. Berries. Bananas. Cherries. Dates. Figs. Grapes. Lemons. Melon.  Oranges. Peaches. Plums. Pomegranate. Vegetables Artichokes. Beets. Broccoli. Cabbage. Carrots. Eggplant. Green beans. Chard. Kale. Spinach. Onions. Leeks. Peas. Squash. Tomatoes. Peppers. Radishes. Grains Whole-grain pasta. Brown rice. Bulgur wheat. Polenta. Couscous. Whole-wheat bread. [place]. Meats and other proteins Beans. Almonds. Sunflower seeds. Pine nuts. Peanuts. [place]. Salmon. Scallops. Shrimp. [place]. Tilapia. Clams. Oysters. Eggs. Poultry without skin. Dairy Low-fat milk. Cheese. Greek yogurt. Fats and oils Extra-virgin olive oil. Avocado oil. Grapeseed oil. Beverages Water. Red wine. Herbal tea. Sweets and desserts Greek yogurt with honey. Baked apples. Poached pears. Trail mix. Seasonings and condiments Basil. Cilantro. Coriander. Cumin. Mint. Parsley. Sage. Rosemary. Tarragon. Garlic. Oregano. Thyme. Pepper. Balsamic vinegar. Tahini. Hummus. Tomato sauce. Olives. Mushrooms. The items listed above may not be a complete list of foods and beverages you can eat. Contact a dietitian for more information. What foods should I limit? This is a list of foods that should be eaten rarely or only on special occasions. Fruits Fruit canned in syrup. Vegetables Deep-fried potatoes (french fries). Grains Prepackaged pasta or rice dishes. Prepackaged cereal with added sugar. Prepackaged snacks with added sugar. Meats and other proteins Beef. Pork. Lamb. Poultry with skin. Hot dogs. Berniece Salines. Dairy Ice cream. Sour cream. Whole milk. Fats and oils Butter. Canola oil. Vegetable oil. Beef fat (tallow). Lard. Beverages Juice. Sugar-sweetened soft drinks. Beer. Liquor and spirits. Sweets and desserts Cookies. Cakes. Pies. Candy. Seasonings and condiments Mayonnaise. Pre-made sauces and marinades. The items listed above may not be a complete list of foods and beverages you should limit. Contact a dietitian for more information. Summary The Mediterranean diet includes both food and  lifestyle choices. Eat a variety of fresh fruits and vegetables, beans, nuts, seeds, and whole grains. Limit the amount of red meat and sweets that you eat. If recommended by your health care provider, drink red wine in moderation. This means 1 glass a day for nonpregnant women and 2 glasses a day for men. A glass of wine equals 5 oz (150 mL). This information is not intended to replace advice given to you by your health care provider. Make sure you discuss any questions you have with your health care provider. Document Revised: 09/06/2019 Document Reviewed: 07/04/2019 Elsevier Patient Education  [place].

## 2022-01-18 ENCOUNTER — Ambulatory Visit: Payer: Medicare PPO | Admitting: Occupational Therapy

## 2022-01-18 ENCOUNTER — Encounter: Payer: Self-pay | Admitting: Occupational Therapy

## 2022-01-18 ENCOUNTER — Ambulatory Visit: Payer: Medicare PPO | Admitting: Physical Therapy

## 2022-01-18 DIAGNOSIS — M6281 Muscle weakness (generalized): Secondary | ICD-10-CM | POA: Diagnosis not present

## 2022-01-18 DIAGNOSIS — R278 Other lack of coordination: Secondary | ICD-10-CM | POA: Diagnosis not present

## 2022-01-18 DIAGNOSIS — I69351 Hemiplegia and hemiparesis following cerebral infarction affecting right dominant side: Secondary | ICD-10-CM | POA: Diagnosis not present

## 2022-01-18 DIAGNOSIS — R2681 Unsteadiness on feet: Secondary | ICD-10-CM

## 2022-01-18 DIAGNOSIS — R2689 Other abnormalities of gait and mobility: Secondary | ICD-10-CM

## 2022-01-18 NOTE — Therapy (Signed)
OUTPATIENT PHYSICAL THERAPY NEURO TREATMENT   Patient Name: MARLYN TONDREAU MRN: [id number] DOB:04/01/1936, 86 y.o., female Today's Date: 01/18/2022  PCP: Jonathon Jordan, MD REFERRING PROVIDER: Domenic Polite, MD    PT End of Session - 01/18/22 1403     Visit Number 5    Number of Visits 17    Date for PT Re-Evaluation 03/17/22    Authorization Type [employer]    Authorization Time Period Waiting on Auth    Progress Note Due on Visit 10    PT Start Time 1400    PT Stop Time 1444    PT Time Calculation (min) 44 min    Activity Tolerance Patient tolerated treatment well    Behavior During Therapy [facility] for tasks assessed/performed               Past Medical History:  Diagnosis Date   Anemia    Blood transfusion without reported diagnosis jan 2015   C. difficile colitis 03/05/14, 03/22/14, 04/05/14   recurrent   Cancer ([place])    liver   Cervical cancer ([place]) 1972   Colon cancer ([place]) JAN 2015   Colon cancer ([place])    Complication of anesthesia    slow to awaken in past, DONE WELL RECENTLY   DVT (deep venous thrombosis) ([place]) 10/24/13   Right leg   Guillain-Barre ([place]) 09/2015   Heart murmur    Hypertension    no bp meds    Liver cyst    Stroke ([place]) 12/2014   NUMBNESS ON LEFT SIDE   Tinnitus of both ears ALL THE TIME   Past Surgical History:  Procedure Laterality Date   ABDOMINAL HYSTERECTOMY     vaginal- partial   BACK SURGERY  1981   lower lumbar   ESOPHAGOGASTRODUODENOSCOPY N/A 10/14/2015   Procedure: ESOPHAGOGASTRODUODENOSCOPY (EGD);  Surgeon: Clarene Essex, MD;  Location: [facility] ENDOSCOPY;  Service: Endoscopy;  Laterality: N/A;  Bedside in ICU   IR CV LINE INJECTION  07/20/2020   IR GENERIC HISTORICAL  09/07/2016   IR RADIOLOGIST EVAL & MGMT 09/07/2016 Aletta Edouard, MD [facility] INTERV RAD   IR IMAGING GUIDED PORT INSERTION  07/29/2019   LAPAROSCOPIC RIGHT COLECTOMY Right 10/22/2013   Procedure: LAPAROSCOPIC RIGHT COLECTOMY ;  Surgeon: [name], MD;   Location: [facility] ORS;  Service: General;  Laterality: Right;   SALPINGOOPHORECTOMY  10/22/2013   Procedure: Lesly Dukes OOPHORECTOMY;  Surgeon: [name]. [name], MD;  Location: [facility] ORS;  Service: Gynecology;;   TUBAL LIGATION  1968   Patient Active Problem List   Diagnosis Date Noted   Pulmonary nodules 12/15/2021   Stroke ([place]) 12/14/2021   Port-A-Cath in place 08/13/2019   Goals of care, counseling/discussion 07/22/2019   Clawfoot, acquired, right 10/31/2016   Poor appetite    Neurogenic bladder    Adjustment disorder with mixed anxiety and depressed mood    Quadriplegia and quadriparesis (HCC)    Numbness and tingling    Paresthesias    UTI (lower urinary tract infection)    History of colon cancer    Acute blood loss anemia    Upper GI bleed    GBS (Guillain-Barre syndrome) (HCC)    Aspiration into airway    Endotracheally intubated    Acute respiratory failure ([place])    Shock ([place]) 10/09/2015   Hemoptysis    Transverse myelitis ([place])    Peripheral neuropathy 10/06/2015   Paresthesia of both hands 10/06/2015   Paresthesia of both feet 10/06/2015   Cerebral embolism with  cerebral infarction 10/06/2015   Ataxia    Colon carcinoma metastatic to liver ([place]) 09/25/2015   Metastasis to liver ([place]) 08/28/2015   Colon cancer metastasized to liver [facility])    Liver lesion    Cerebral infarction due to unspecified mechanism 01/29/2015   B12 deficiency 01/29/2015   Essential hypertension 01/29/2015   Arm numbness left    Disorientation    Numbness and tingling of left side of face    TIA (transient ischemic attack) 01/11/2015   Confusion    Left arm numbness    DVT, lower extremity, distal ([place]) 10/27/2013   T4a, N0 09/27/2013   Iron deficiency anemia 09/04/2013   Symptomatic anemia 09/03/2013   Orthostasis 09/03/2013   Near syncope 09/03/2013    ONSET DATE: 12/15/2021 (referral)  REFERRING DIAG: I63.9 (ICD-10-CM) - Acute CVA (cerebrovascular accident) ([place])   THERAPY  DIAG:  Muscle weakness (generalized)  Other abnormalities of gait and mobility  Unsteadiness on feet  SUBJECTIVE: Patient denies any new changes. Exercises are going well.                                                                                                                                                                                             PAIN:  Are you having pain? No    PRECAUTIONS: Fall   OBJECTIVE:   TODAY'S TREATMENT:  Ther Act   [facility] PT Assessment - 01/18/22 1410       Transfers   Five time sit to stand comments  18.22s without UE support      Ambulation/Gait   Gait velocity 32.8' over 17.44s = 1.88 ft/s with rollator             Gait Training  Gait pattern:  Forward trunk lean, step through pattern, decreased stride length, decreased hip/knee flexion- Right, decreased hip/knee flexion- Left, decreased ankle dorsiflexion- Right, and decreased ankle dorsiflexion- Left Distance walked: 150' on sidewalk and >200' outside in grass, 115' inside clinic  Assistive device utilized: Environmental consultant - 4 wheeled Level of assistance: SBA Comments: Practiced ambulating on unlevel surfaces w/rollator and demonstrated good management of AD on unlevel surfaces as well as good balance. Pt very fatigued following gait training and noted pt pushing rollator forward due to improper height. Adjusted rollator to proper height and pt ambulated inside clinic, noted improved posture and pt reported ease of driving rollator compared to shorter height. Lengthy discussion regarding importance of walking program for improved endurance and global strength. Pt and daughter report living close to [employer] park and pt previously walking there. Encouraged pt to resume walking at [place] this week, pt  verbalized understanding.    PATIENT EDUCATION: Education details: performing HEP without weights until endurance/strength improves, walking program  Person educated: Patient and  Child(ren) Education method: Explanation, Demonstration, Verbal cues, and Handouts Education comprehension: verbalized understanding and needs further education   HOME EXERCISE PROGRAM: Access Code: [email or web address] URL: [email or web address] Date: 01/06/2022 Prepared by: Mickie Bail Traci Plemons  Exercises - Sit to Stand Without Arm Support  - 1 x daily - 7 x weekly - 3 sets - 10 reps - Standing March with Counter Support  - 1 x daily - 7 x weekly - 3 sets - 10 reps - Standing Hip Abduction with Resistance at Ankles and Counter Support  - 1 x daily - 7 x weekly - 3 sets - 10 reps - Standing Hip Extension with Resistance at Ankles and Counter Support  - 1 x daily - 7 x weekly - 3 sets - 10 reps - Side Stepping with red Resistance band at Thighs and Counter Support  - 1 x daily - 7 x weekly - 3 sets - 10 reps - Forward Backward Monster Walk with red Band at Thighs and Counter Support  - 1 x daily - 7 x weekly - 3 sets - 10 reps  GOALS: Goals reviewed with patient? Yes  SHORT TERM GOALS: Target date: 01/20/2022     Pt will be independent with initial HEP for improved strength, balance, transfers and gait.  Baseline: initiated on eval Goal status: IN PROGRESS  2.  Pt will improve gait velocity to at least 2.0 ft/s with LRAD for improved gait efficiency   Baseline: 1.64 ft/s w/RW; 1.88 ft/s with rollator  Goal status: NOT MET  3.  Pt will improve Berg score to 42/56 for decreased fall risk  Baseline: 37/56 Goal status: INITIAL  4.  Pt will improve 5 x STS to less than or equal to 20 seconds without UE support to demonstrate improved functional strength and transfer efficiency.   Baseline: 24.34s w/BUE support; 18.22s without UE support  Goal status: MET  5.  Pt will ambulate 250' on unlevel surface w/S* and LRAD for improved functional mobility and imitation of home environment  Baseline:  Goal status: MET   LONG TERM GOALS: Target date: 02/17/2022     Pt will be independent  with final HEP for improved strength, balance, transfers and gait.  Baseline:  Goal status: INITIAL  2. Pt will improve Berg score to 47/56 for decreased fall risk  Baseline: 37/56 Goal status: INITIAL  3. Pt will improve gait velocity to at least 2.2 ft/s with LRAD for improved gait efficiency   Baseline: 1.64 ft/s w/RW; 1.88 ft/s with rollator on 6/6 Goal status: REVISED  4.  Pt will improve 5 x STS to less than or equal to 15 seconds without UE support to demonstrate improved functional strength and transfer efficiency.   Baseline: 24.34s w/BUE support Goal status: INITIAL  5.  Pt will ambulate >500' on level/unlevel surface w/LRAD mod I for improved independence with functional mobility  Baseline:  Goal status: INITIAL  ASSESSMENT:  CLINICAL IMPRESSION: Emphasis of skilled PT session on STG assessment and gait on unlevel surfaces w/rollator. Pt has met 2 of 5 STGs so far, improving her 5x STS time and ambulating on unlevel surfaces w/S* and rollator. Pt is progressing her HEP goal, as she is not performing regularly and has not initiated walking program yet. Pt did not meet her gait speed goal but did improve her speed from baseline w/rollator. Pt fatigued very  quickly w/gait outdoors and encouraged pt to prioritize walking program for global endurance and strength rather than performing HEP consisently w/heavy weights (pt unable to perform many reps w/ankle weights added). Pt verbalized understanding.    OBJECTIVE IMPAIRMENTS Abnormal gait, decreased activity tolerance, decreased balance, decreased endurance, decreased mobility, decreased strength, and impaired sensation.   ACTIVITY LIMITATIONS community activity, driving, meal prep, laundry, medication management, and yard work.   PERSONAL FACTORS Age, Past/current experiences, Time since onset of injury/illness/exacerbation, and 1 comorbidity: hx of Guillain-Barre  are also affecting patient's functional outcome.    REHAB  POTENTIAL: Good  CLINICAL DECISION MAKING: Stable/uncomplicated  EVALUATION COMPLEXITY: Low  PLAN: PT FREQUENCY: 2x/week  PT DURATION: 12 weeks  PLANNED INTERVENTIONS: Therapeutic exercises, Therapeutic activity, Neuromuscular re-education, Balance training, Gait training, Patient/Family education, Stair training, DME instructions, and Aquatic Therapy  PLAN FOR NEXT SESSION: Merrilee Jansky, How is HEP? Stair training, Toe taps to 4" step, gait training with rollator and without AD, rollator on grass/hills, SciFit for endurance, single leg stance, turns, obstacle navigation, eccentric heel taps, hurdles, elevated heels squats   Lamekia Nolden E Nicosha Struve, PT, DPT 01/18/22, 2:54 PM

## 2022-01-20 ENCOUNTER — Ambulatory Visit: Payer: Medicare PPO | Admitting: Occupational Therapy

## 2022-01-20 ENCOUNTER — Ambulatory Visit: Payer: Medicare PPO | Admitting: Physical Therapy

## 2022-01-20 DIAGNOSIS — M6281 Muscle weakness (generalized): Secondary | ICD-10-CM

## 2022-01-20 DIAGNOSIS — R2681 Unsteadiness on feet: Secondary | ICD-10-CM | POA: Diagnosis not present

## 2022-01-20 DIAGNOSIS — R2689 Other abnormalities of gait and mobility: Secondary | ICD-10-CM | POA: Diagnosis not present

## 2022-01-20 DIAGNOSIS — I69351 Hemiplegia and hemiparesis following cerebral infarction affecting right dominant side: Secondary | ICD-10-CM | POA: Diagnosis not present

## 2022-01-20 DIAGNOSIS — R278 Other lack of coordination: Secondary | ICD-10-CM | POA: Diagnosis not present

## 2022-01-20 NOTE — Therapy (Signed)
OUTPATIENT PHYSICAL THERAPY NEURO TREATMENT   Patient Name: LAYLONI FAHRNER MRN: [id number] DOB:[birth year], 86 y.o., female Today's Date: 01/20/2022  PCP: Jonathon Jordan, MD REFERRING PROVIDER: Domenic Polite, MD    PT End of Session - 01/20/22 1453     Visit Number 6    Number of Visits 17    Date for PT Re-Evaluation 02/14/22   [facility] Auth   Authorization Type [employer]    Authorization Time Period 55 PT visits approved 12/20/21 - 02/14/22    Progress Note Due on Visit 10    PT Start Time 1450    PT Stop Time 1533    PT Time Calculation (min) 43 min    Activity Tolerance Patient tolerated treatment well    Behavior During Therapy [facility] for tasks assessed/performed               Past Medical History:  Diagnosis Date   Anemia    Blood transfusion without reported diagnosis jan 2015   C. difficile colitis 03/05/14, 03/22/14, 04/05/14   recurrent   Cancer ([place])    liver   Cervical cancer ([place]) 1972   Colon cancer ([place]) JAN 2015   Colon cancer ([place])    Complication of anesthesia    slow to awaken in past, DONE WELL RECENTLY   DVT (deep venous thrombosis) ([place]) 10/24/13   Right leg   Guillain-Barre ([place]) 09/2015   Heart murmur    Hypertension    no bp meds    Liver cyst    Stroke ([place]) 12/2014   NUMBNESS ON LEFT SIDE   Tinnitus of both ears ALL THE TIME   Past Surgical History:  Procedure Laterality Date   ABDOMINAL HYSTERECTOMY     vaginal- partial   BACK SURGERY  1981   lower lumbar   ESOPHAGOGASTRODUODENOSCOPY N/A 10/14/2015   Procedure: ESOPHAGOGASTRODUODENOSCOPY (EGD);  Surgeon: Clarene Essex, MD;  Location: [facility] ENDOSCOPY;  Service: Endoscopy;  Laterality: N/A;  Bedside in ICU   IR CV LINE INJECTION  07/20/2020   IR GENERIC HISTORICAL  09/07/2016   IR RADIOLOGIST EVAL & MGMT 09/07/2016 Aletta Edouard, MD [facility] INTERV RAD   IR IMAGING GUIDED PORT INSERTION  07/29/2019   LAPAROSCOPIC RIGHT COLECTOMY Right 10/22/2013   Procedure: LAPAROSCOPIC RIGHT  COLECTOMY ;  Surgeon: [name], MD;  Location[facility] ORS;  Service: General;  Laterality: Right;   SALPINGOOPHORECTOMY  10/22/2013   Procedure: Lesly Dukes OOPHORECTOMY;  Surgeon: [name]. [name], MD;  Location: [facility] ORS;  Service: Gynecology;;   TUBAL LIGATION  1968   Patient Active Problem List   Diagnosis Date Noted   Pulmonary nodules 12/15/2021   Stroke ([place]) 12/14/2021   Port-A-Cath in place 08/13/2019   Goals of care, counseling/discussion 07/22/2019   Clawfoot, acquired, right 10/31/2016   Poor appetite    Neurogenic bladder    Adjustment disorder with mixed anxiety and depressed mood    Quadriplegia and quadriparesis (HCC)    Numbness and tingling    Paresthesias    UTI (lower urinary tract infection)    History of colon cancer    Acute blood loss anemia    Upper GI bleed    GBS (Guillain-Barre syndrome) (HCC)    Aspiration into airway    Endotracheally intubated    Acute respiratory failure ([place])    Shock ([place]) 10/09/2015   Hemoptysis    Transverse myelitis ([place])    Peripheral neuropathy 10/06/2015   Paresthesia of both hands 10/06/2015   Paresthesia of both  feet 10/06/2015   Cerebral embolism with cerebral infarction 10/06/2015   Ataxia    Colon carcinoma metastatic to liver ([place]) 09/25/2015   Metastasis to liver ([place]) 08/28/2015   Colon cancer metastasized to liver [facility])    Liver lesion    Cerebral infarction due to unspecified mechanism 01/29/2015   B12 deficiency 01/29/2015   Essential hypertension 01/29/2015   Arm numbness left    Disorientation    Numbness and tingling of left side of face    TIA (transient ischemic attack) 01/11/2015   Confusion    Left arm numbness    DVT, lower extremity, distal ([place]) 10/27/2013   T4a, N0 09/27/2013   Iron deficiency anemia 09/04/2013   Symptomatic anemia 09/03/2013   Orthostasis 09/03/2013   Near syncope 09/03/2013    ONSET DATE: 12/15/2021 (referral)  REFERRING DIAG: I63.9 (ICD-10-CM) - Acute CVA  (cerebrovascular accident) ([place])   THERAPY DIAG:  Muscle weakness (generalized)  Unsteadiness on feet  Other abnormalities of gait and mobility  SUBJECTIVE: Pt reports she will try to walk this weekend if the weather permits and air quality warning gets lifted. Otherwise no new changes                                                                                                                                                                                             PAIN:  Are you having pain? No    PRECAUTIONS: Fall   OBJECTIVE:   TODAY'S TREATMENT:  Ther Act STG assessment   [facility] PT Assessment - 01/20/22 1456       Berg Balance Test   Sit to Stand Able to stand without using hands and stabilize independently    Standing Unsupported Able to stand safely 2 minutes    Sitting with Back Unsupported but Feet Supported on Floor or Stool Able to sit safely and securely 2 minutes    Stand to Sit Sits safely with minimal use of hands    Transfers Able to transfer safely, minor use of hands    Standing Unsupported with Eyes Closed Able to stand 10 seconds safely    Standing Unsupported with Feet Together Able to place feet together independently and stand 1 minute safely    From Standing, Reach Forward with Outstretched Arm Can reach forward >12 cm safely (5")   8"   From Standing Position, Pick up Object from Floor Able to pick up shoe safely and easily    From Standing Position, Turn to Look Behind Over each Shoulder Looks behind from both sides and weight shifts well    Turn 360 Degrees Able to turn  360 degrees safely but slowly    Standing Unsupported, Alternately Place Feet on Step/Stool Able to complete 4 steps without aid or supervision    Standing Unsupported, One Foot in Front Able to take small step independently and hold 30 seconds    Standing on One Leg Tries to lift leg/unable to hold 3 seconds but remains standing independently    Total Score 46    Berg comment:  moderate fall risk             NMR  In // bars for improved ankle strategy, single leg stability and BLE coordination: -Standing on AirEx with EO x3 minutes without UE support, CGA throughout for steadying assist.  -Progressed to single cone taps from airex, x10 per side without UE support. Noted increased difficulty tapping w/RLE>LLE.  -Progressed to double cone taps for increased SLS time, x10 per side. Noted pt bracing against // bars on R side to tap w/LLE. Min cues to focus on lateral weightshift and pt able to perform task without UE support, CGA throughout.    PATIENT EDUCATION: Education details: Continue HEP/walking program, Merrilee Jansky results, walking inside house without AD for short distances  Person educated: Patient and Child(ren) Education method: Explanation, Demonstration, Verbal cues, and Handouts Education comprehension: verbalized understanding and needs further education   HOME EXERCISE PROGRAM: Access Code: [email or web address] URL: [email or web address] Date: 01/06/2022 Prepared by: Mickie Bail Elesia Pemberton  Exercises - Sit to Stand Without Arm Support  - 1 x daily - 7 x weekly - 3 sets - 10 reps - Standing March with Counter Support  - 1 x daily - 7 x weekly - 3 sets - 10 reps - Standing Hip Abduction with Resistance at Ankles and Counter Support  - 1 x daily - 7 x weekly - 3 sets - 10 reps - Standing Hip Extension with Resistance at Ankles and Counter Support  - 1 x daily - 7 x weekly - 3 sets - 10 reps - Side Stepping with red Resistance band at Thighs and Counter Support  - 1 x daily - 7 x weekly - 3 sets - 10 reps - Forward Backward Monster Walk with red Band at Thighs and Counter Support  - 1 x daily - 7 x weekly - 3 sets - 10 reps  GOALS: Goals reviewed with patient? Yes  SHORT TERM GOALS: Target date: 01/20/2022     Pt will be independent with initial HEP for improved strength, balance, transfers and gait.  Baseline: initiated on eval Goal status: IN  PROGRESS  2.  Pt will improve gait velocity to at least 2.0 ft/s with LRAD for improved gait efficiency   Baseline: 1.64 ft/s w/RW; 1.88 ft/s with rollator  Goal status: NOT MET  3.  Pt will improve Berg score to 42/56 for decreased fall risk  Baseline: 37/56; 46/56 Goal status: MET  4.  Pt will improve 5 x STS to less than or equal to 20 seconds without UE support to demonstrate improved functional strength and transfer efficiency.   Baseline: 24.34s w/BUE support; 18.22s without UE support  Goal status: MET  5.  Pt will ambulate 250' on unlevel surface w/S* and LRAD for improved functional mobility and imitation of home environment  Baseline:  Goal status: MET   LONG TERM GOALS: Target date: 02/17/2022     Pt will be independent with final HEP for improved strength, balance, transfers and gait.  Baseline:  Goal status: INITIAL  2. Pt will improve Berg score to 47/56 for  decreased fall risk  Baseline: 37/56 Goal status: INITIAL  3. Pt will improve gait velocity to at least 2.2 ft/s with LRAD for improved gait efficiency   Baseline: 1.64 ft/s w/RW; 1.88 ft/s with rollator on 6/6 Goal status: REVISED  4.  Pt will improve 5 x STS to less than or equal to 15 seconds without UE support to demonstrate improved functional strength and transfer efficiency.   Baseline: 24.34s w/BUE support Goal status: INITIAL  5.  Pt will ambulate >500' on level/unlevel surface w/LRAD mod I for improved independence with functional mobility  Baseline:  Goal status: INITIAL  ASSESSMENT:  CLINICAL IMPRESSION: Emphasis of skilled PT session on STG assessment and dynamic standing balance. Pt has improved her Berg score from a 37/56 to 46/56, indicative of improvements with single leg stability, narrow BOS and dynamic balance. Pt's LTG updated to reflect improvements in balance. Remainder of session spent on standing balance on unlevel surfaces and single leg support. Pt initially demonstrating  difficulty to perform task but w/cues to laterally weight shift, was able to perform without support. Continue POC.    OBJECTIVE IMPAIRMENTS Abnormal gait, decreased activity tolerance, decreased balance, decreased endurance, decreased mobility, decreased strength, and impaired sensation.   ACTIVITY LIMITATIONS community activity, driving, meal prep, laundry, medication management, and yard work.   PERSONAL FACTORS Age, Past/current experiences, Time since onset of injury/illness/exacerbation, and 1 comorbidity: hx of Guillain-Barre  are also affecting patient's functional outcome.    REHAB POTENTIAL: Good  CLINICAL DECISION MAKING: Stable/uncomplicated  EVALUATION COMPLEXITY: Low  PLAN: PT FREQUENCY: 2x/week  PT DURATION: 12 weeks  PLANNED INTERVENTIONS: Therapeutic exercises, Therapeutic activity, Neuromuscular re-education, Balance training, Gait training, Patient/Family education, Stair training, DME instructions, and Aquatic Therapy  PLAN FOR NEXT SESSION: How is HEP? Stair training, Toe taps to 4" step, gait training with rollator and without AD, rollator on grass/hills, SciFit for endurance, single leg stance, turns, obstacle navigation, eccentric heel taps, hurdles, elevated heels squats   Alla Sloma E Evamae Rowen, PT, DPT 01/20/22, 4:04 PM

## 2022-01-20 NOTE — Therapy (Signed)
OUTPATIENT OCCUPATIONAL THERAPY NEURO TREATMENT  Patient Name: Cynthia [name] MRN: [id number] DOB:01/21/1936, 86 y.o., female Today's Date: 01/20/2022  PCP: Dr. Jonathon Jordan REFERRING PROVIDER: Domenic Polite, MD; will follow up with  Dr. Metta Clines (neurology)     OT End of Session - 01/20/22 1551     Visit Number 7    Number of Visits 17    Date for OT Re-Evaluation 02/19/22    Authorization Type [employer]; prior auth required    Authorization - Visit Number 7    Authorization - Number of Visits 10    Progress Note Due on Visit 10    OT Start Time 1275    OT Stop Time 1700    OT Time Calculation (min) 38 min    Activity Tolerance Patient tolerated treatment well    Behavior During Therapy [facility] for tasks assessed/performed                   Past Medical History:  Diagnosis Date   Anemia    Blood transfusion without reported diagnosis jan 2015   C. difficile colitis 03/05/14, 03/22/14, 04/05/14   recurrent   Cancer ([place])    liver   Cervical cancer ([place]) 1972   Colon cancer ([place]) JAN 2015   Colon cancer ([place])    Complication of anesthesia    slow to awaken in past, DONE WELL RECENTLY   DVT (deep venous thrombosis) ([place]) 10/24/13   Right leg   Guillain-Barre ([place]) 09/2015   Heart murmur    Hypertension    no bp meds    Liver cyst    Stroke ([place]) 12/2014   NUMBNESS ON LEFT SIDE   Tinnitus of both ears ALL THE TIME   Past Surgical History:  Procedure Laterality Date   ABDOMINAL HYSTERECTOMY     vaginal- partial   BACK SURGERY  1981   lower lumbar   ESOPHAGOGASTRODUODENOSCOPY N/A 10/14/2015   Procedure: ESOPHAGOGASTRODUODENOSCOPY (EGD);  Surgeon: Clarene Essex, MD;  Location: [facility] ENDOSCOPY;  Service: Endoscopy;  Laterality: N/A;  Bedside in ICU   IR CV LINE INJECTION  07/20/2020   IR GENERIC HISTORICAL  09/07/2016   IR RADIOLOGIST EVAL & MGMT 09/07/2016 Aletta Edouard, MD [facility] INTERV RAD   IR IMAGING GUIDED PORT INSERTION  07/29/2019   LAPAROSCOPIC  RIGHT COLECTOMY Right 10/22/2013   Procedure: LAPAROSCOPIC RIGHT COLECTOMY ;  Surgeon: [name], MD;  Location: [facility] ORS;  Service: General;  Laterality: Right;   SALPINGOOPHORECTOMY  10/22/2013   Procedure: Lesly Dukes OOPHORECTOMY;  Surgeon: [name]. [name], MD;  Location: [facility] ORS;  Service: Gynecology;;   TUBAL LIGATION  1968   Patient Active Problem List   Diagnosis Date Noted   Pulmonary nodules 12/15/2021   Stroke ([place]) 12/14/2021   Port-A-Cath in place 08/13/2019   Goals of care, counseling/discussion 07/22/2019   Clawfoot, acquired, right 10/31/2016   Poor appetite    Neurogenic bladder    Adjustment disorder with mixed anxiety and depressed mood    Quadriplegia and quadriparesis (HCC)    Numbness and tingling    Paresthesias    UTI (lower urinary tract infection)    History of colon cancer    Acute blood loss anemia    Upper GI bleed    GBS (Guillain-Barre syndrome) (HCC)    Aspiration into airway    Endotracheally intubated    Acute respiratory failure ([place])    Shock ([place]) 10/09/2015   Hemoptysis    Transverse myelitis ([place])  Peripheral neuropathy 10/06/2015   Paresthesia of both hands 10/06/2015   Paresthesia of both feet 10/06/2015   Cerebral embolism with cerebral infarction 10/06/2015   Ataxia    Colon carcinoma metastatic to liver ([place]) 09/25/2015   Metastasis to liver ([place]) 08/28/2015   Colon cancer metastasized to liver [facility])    Liver lesion    Cerebral infarction due to unspecified mechanism 01/29/2015   B12 deficiency 01/29/2015   Essential hypertension 01/29/2015   Arm numbness left    Disorientation    Numbness and tingling of left side of face    TIA (transient ischemic attack) 01/11/2015   Confusion    Left arm numbness    DVT, lower extremity, distal ([place]) 10/27/2013   T4a, N0 09/27/2013   Iron deficiency anemia 09/04/2013   Symptomatic anemia 09/03/2013   Orthostasis 09/03/2013   Near syncope 09/03/2013    ONSET DATE: 12/14/21    REFERRING DIAG: I63.9 (ICD-10-CM) - Acute CVA (cerebrovascular accident) ([place])   THERAPY DIAG:  Muscle weakness (generalized)  Hemiplegia and hemiparesis following cerebral infarction affecting right dominant side ([place])  Other lack of coordination  Unsteadiness on feet  SUBJECTIVE:   SUBJECTIVE STATEMENT: "I've done a lot of things - I cooked an egg"  Pt accompanied by: daughter   PERTINENT HISTORY: presented to ED with acute onset of RLE weakness.  MRI showed small acute infarcts of L caudate tail and anterior L centrum semiovale, Wearing heart monitor for 1 month (until 01/22/22)  PMH also includes:  hx of Guillian Barre, hx of CVA  (L numbness 2016), hx of colon cancer s/p colectomy with hepatic metastasis, pulmonary nodules,  tinnitus of both ears, paripheral neuropathy  PRECAUTIONS: Fall, 24 hr supervision, active CA (lung--monitoring)  PAIN:  Are you having pain? No  OBJECTIVE:  TODAY'S TREATMENT:   01/20/22 Pt completd 9 hole peg test - see goals.  Resistance Clothespins Pt completed dynamic standing with reaching up to antenna and to the right with weight shift and then back to place back on poles.  Grooved Pegs with RUE for increased coordination. Pt placed pegs with one at a time and removed with in hand manipulation. Pt completed with min difficulty and min drops. Peg  In hand Manipulation with golf balls. RUE  Hand Gripper: with RUE on level 2 with black spring. Pt picked up 1 inch blocks with gripper with min drops and min difficulty.       HOME EXERCISE PROGRAM: 01/03/22:  Coordination HEP    GOALS: Potential Goals reviewed with patient? Yes  SHORT TERM GOALS: Target date: 01/18/2022  Pt will be independent with initial HEP. Goal status: MET  2.  Pt will perform BADLs mod I except shower transfer. Goal status:  MET 01/18/22  3.  Pt will perform simple home maintenance task mod I.  Goal status:  ONGOING  01/18/22:  pt reports that she has washed  some dishes, but not performing other tasks.  4.  Pt will improve R hand coordination for ADLs as shown improving  completing 9-hole peg test by at least 3sec. Baseline:  26.75sec Goal status: ongoing Rt 25.29s    LONG TERM GOALS: Target date: 02/15/2022  Pt will be independent with updated HEP. Goal status: INITIAL  2.  Pt will perform simple cooking task mod I. Goal status: INITIAL  3.  Pt will perform shower transfer mod I. Goal status: INITIAL  4.  Pt will be able to plant flowers in pot mod I. Goal status:  INITIAL  5.  Pt will be able to rake with supervision. Goal status: INITIAL    ASSESSMENT:  CLINICAL IMPRESSION: Pt is progressing towards goals. She demonstrates improving balance and coordination.  PERFORMANCE DEFICITS in functional skills including ADLs, IADLs, coordination, dexterity, FMC, GMC, mobility, balance, endurance, decreased knowledge of use of DME, and UE functional use, cognitive skills including memory.  IMPAIRMENTS are limiting patient from ADLs, IADLs, and leisure.   COMORBIDITIES  hx of CA (cervical, colon, liver cyst), hx of Guillain-Barre Syndrome (09/2015), HTN, hx of CVA that affects occupational performance. Patient will benefit from skilled OT to address above impairments and improve overall function.  MODIFICATION OR ASSISTANCE TO COMPLETE EVALUATION: Min-Moderate modification of tasks or assist with assess necessary to complete an evaluation.  OT OCCUPATIONAL PROFILE AND HISTORY: Detailed assessment: Review of records and additional review of physical, cognitive, psychosocial history related to current functional performance.  CLINICAL DECISION MAKING: Moderate - several treatment options, min-mod task modification necessary  REHAB POTENTIAL: Good  EVALUATION COMPLEXITY: Moderate    PLAN: OT FREQUENCY: 2x/week  OT DURATION: 8 weeks +eval  PLANNED INTERVENTIONS: self care/ADL training, therapeutic exercise, therapeutic activity,  neuromuscular re-education, manual therapy, passive range of motion, balance training, functional mobility training, patient/family education, cognitive remediation/compensation, energy conservation, and DME and/or AE instructions  RECOMMENDED OTHER SERVICES: scheduled for PT eval Thurs.  CONSULTED AND AGREED WITH PLAN OF CARE: Patient and family member/caregiver  PLAN FOR NEXT SESSION:  continue with standing balance for functional tasks, simple IADL task, practicing sweeping to simulate raking   Zachery Conch, OT 01/20/2022, 4:13 PM  [facility] [address] [place], [place]  [address] [phone] phone [phone] 01/20/22 4:13 PM

## 2022-01-24 ENCOUNTER — Ambulatory Visit: Payer: Medicare PPO | Admitting: Occupational Therapy

## 2022-01-24 ENCOUNTER — Ambulatory Visit: Payer: Medicare PPO | Admitting: Physical Therapy

## 2022-01-24 ENCOUNTER — Encounter: Payer: Self-pay | Admitting: Occupational Therapy

## 2022-01-24 DIAGNOSIS — R2681 Unsteadiness on feet: Secondary | ICD-10-CM | POA: Diagnosis not present

## 2022-01-24 DIAGNOSIS — R278 Other lack of coordination: Secondary | ICD-10-CM

## 2022-01-24 DIAGNOSIS — R2689 Other abnormalities of gait and mobility: Secondary | ICD-10-CM

## 2022-01-24 DIAGNOSIS — M6281 Muscle weakness (generalized): Secondary | ICD-10-CM

## 2022-01-24 DIAGNOSIS — I69351 Hemiplegia and hemiparesis following cerebral infarction affecting right dominant side: Secondary | ICD-10-CM | POA: Diagnosis not present

## 2022-01-24 NOTE — Therapy (Signed)
OUTPATIENT OCCUPATIONAL THERAPY NEURO TREATMENT  Patient Name: Cynthia [name] MRN: [id number] DOB:Jul 14, 1936, 86 y.o., female Today's Date: 01/24/2022  PCP: Dr. Jonathon Jordan REFERRING PROVIDER: Domenic Polite, MD; will follow up with  Dr. Metta Clines (neurology)     OT End of Session - 01/24/22 1021     Visit Number 8    Number of Visits 17    Date for OT Re-Evaluation 02/19/22    Authorization Type [employer]; prior auth required    Authorization - Visit Number 8    Authorization - Number of Visits 10    Progress Note Due on Visit 10    OT Start Time 1020    OT Stop Time 1102    OT Time Calculation (min) 42 min    Activity Tolerance Patient tolerated treatment well    Behavior During Therapy [facility] for tasks assessed/performed                   Past Medical History:  Diagnosis Date   Anemia    Blood transfusion without reported diagnosis jan 2015   C. difficile colitis 03/05/14, 03/22/14, 04/05/14   recurrent   Cancer ([place])    liver   Cervical cancer ([place]) 1972   Colon cancer ([place]) JAN 2015   Colon cancer ([place])    Complication of anesthesia    slow to awaken in past, DONE WELL RECENTLY   DVT (deep venous thrombosis) ([place]) 10/24/13   Right leg   Guillain-Barre ([place]) 09/2015   Heart murmur    Hypertension    no bp meds    Liver cyst    Stroke ([place]) 12/2014   NUMBNESS ON LEFT SIDE   Tinnitus of both ears ALL THE TIME   Past Surgical History:  Procedure Laterality Date   ABDOMINAL HYSTERECTOMY     vaginal- partial   BACK SURGERY  1981   lower lumbar   ESOPHAGOGASTRODUODENOSCOPY N/A 10/14/2015   Procedure: ESOPHAGOGASTRODUODENOSCOPY (EGD);  Surgeon: Clarene Essex, MD;  Location: [facility] ENDOSCOPY;  Service: Endoscopy;  Laterality: N/A;  Bedside in ICU   IR CV LINE INJECTION  07/20/2020   IR GENERIC HISTORICAL  09/07/2016   IR RADIOLOGIST EVAL & MGMT 09/07/2016 Aletta Edouard, MD [facility] INTERV RAD   IR IMAGING GUIDED PORT INSERTION  07/29/2019   LAPAROSCOPIC  RIGHT COLECTOMY Right 10/22/2013   Procedure: LAPAROSCOPIC RIGHT COLECTOMY ;  Surgeon: [name], MD;  Location[facility];  Service: General;  Laterality: Right;   SALPINGOOPHORECTOMY  10/22/2013   Procedure: Lesly Dukes OOPHORECTOMY;  Surgeon: [name]. [name], MD;  Location: [facility];  Service: Gynecology;;   TUBAL LIGATION  1968   Patient Active Problem List   Diagnosis Date Noted   Pulmonary nodules 12/15/2021   Stroke ([place]) 12/14/2021   Port-A-Cath in place 08/13/2019   Goals of care, counseling/discussion 07/22/2019   Clawfoot, acquired, right 10/31/2016   Poor appetite    Neurogenic bladder    Adjustment disorder with mixed anxiety and depressed mood    Quadriplegia and quadriparesis (HCC)    Numbness and tingling    Paresthesias    UTI (lower urinary tract infection)    History of colon cancer    Acute blood loss anemia    Upper GI bleed    GBS (Guillain-Barre syndrome) (HCC)    Aspiration into airway    Endotracheally intubated    Acute respiratory failure ([place])    Shock ([place]) 10/09/2015   Hemoptysis    Transverse myelitis ([place])  Peripheral neuropathy 10/06/2015   Paresthesia of both hands 10/06/2015   Paresthesia of both feet 10/06/2015   Cerebral embolism with cerebral infarction 10/06/2015   Ataxia    Colon carcinoma metastatic to liver ([place]) 09/25/2015   Metastasis to liver ([place]) 08/28/2015   Colon cancer metastasized to liver [facility])    Liver lesion    Cerebral infarction due to unspecified mechanism 01/29/2015   B12 deficiency 01/29/2015   Essential hypertension 01/29/2015   Arm numbness left    Disorientation    Numbness and tingling of left side of face    TIA (transient ischemic attack) 01/11/2015   Confusion    Left arm numbness    DVT, lower extremity, distal ([place]) 10/27/2013   T4a, N0 09/27/2013   Iron deficiency anemia 09/04/2013   Symptomatic anemia 09/03/2013   Orthostasis 09/03/2013   Near syncope 09/03/2013    ONSET DATE: 12/14/21    REFERRING DIAG: I63.9 (ICD-10-CM) - Acute CVA (cerebrovascular accident) ([place])   THERAPY DIAG:  Muscle weakness (generalized)  Unsteadiness on feet  Other abnormalities of gait and mobility  Hemiplegia and hemiparesis following cerebral infarction affecting right dominant side (HCC)  Other lack of coordination  SUBJECTIVE:   SUBJECTIVE STATEMENT: "I've done a lot of things - I cooked an egg"  Pt accompanied by: daughter   PERTINENT HISTORY: presented to ED with acute onset of RLE weakness.  MRI showed small acute infarcts of L caudate tail and anterior L centrum semiovale, Wearing heart monitor for 1 month (until 01/22/22)  PMH also includes:  hx of Guillian Barre, hx of CVA  (L numbness 2016), hx of colon cancer s/p colectomy with hepatic metastasis, pulmonary nodules,  tinnitus of both ears, paripheral neuropathy  PRECAUTIONS: Fall, 24 hr supervision, active CA (lung--monitoring)  PAIN:  Are you having pain? No  OBJECTIVE:  TODAY'S TREATMENT:   Completing [place] Pegboard with R hand with min difficulty/incr time for incr coordination, removing pieces using in-hand manipulation.  Ambulating with Rolator to gather clothespins by color for environmental scanning/navigation and functional reach for clothespins from various heights (lower and upper cabinets) to simulate IADL tasks.  Min cueing for walker safety/navigation (locking brakes, when to use countertop for reaching) initially, then pt returned demo, close supervision provided but no LOB.  Pt with occasional min v.c./incr time for visual scanning.       Simulated making bed:  putting on/spreading sheet and blanket on mat and placing pillow-cases on pillows with supervision for balance.  Pt then removed and folded linens.     Picking up and stacking coins with each finger/thumb with R hand with occasional min difficultly.    Arm bike x55mn level 1 forward/backwards for conditioning without rest    HOME EXERCISE  PROGRAM: 01/03/22:  Coordination HEP    GOALS: Potential Goals reviewed with patient? Yes  SHORT TERM GOALS: Target date: 01/18/2022  Pt will be independent with initial HEP. Goal status: MET  2.  Pt will perform BADLs mod I except shower transfer. Goal status:  MET 01/18/22  3.  Pt will perform simple home maintenance task mod I.  Goal status:  ONGOING  01/18/22:  pt reports that she has washed some dishes, but not performing other tasks.  4.  Pt will improve R hand coordination for ADLs as shown improving  completing 9-hole peg test by at least 3sec. Baseline:  26.75sec Goal status: ongoing Rt 25.29s    LONG TERM GOALS: Target date: 02/15/2022  Pt will be independent  with updated HEP. Goal status: INITIAL  2.  Pt will perform simple cooking task mod I. Goal status: INITIAL  3.  Pt will perform shower transfer mod I. Goal status: INITIAL  4.  Pt will be able to plant flowers in pot mod I. Goal status: INITIAL  5.  Pt will be able to rake with supervision. Goal status: INITIAL    ASSESSMENT:  CLINICAL IMPRESSION: Pt is progressing towards goals. She demonstrates improving balance and coordination and activity tolerance.  PERFORMANCE DEFICITS in functional skills including ADLs, IADLs, coordination, dexterity, FMC, GMC, mobility, balance, endurance, decreased knowledge of use of DME, and UE functional use, cognitive skills including memory.  IMPAIRMENTS are limiting patient from ADLs, IADLs, and leisure.   COMORBIDITIES  hx of CA (cervical, colon, liver cyst), hx of Guillain-Barre Syndrome (09/2015), HTN, hx of CVA that affects occupational performance. Patient will benefit from skilled OT to address above impairments and improve overall function.  MODIFICATION OR ASSISTANCE TO COMPLETE EVALUATION: Min-Moderate modification of tasks or assist with assess necessary to complete an evaluation.  OT OCCUPATIONAL PROFILE AND HISTORY: Detailed assessment: Review of records and  additional review of physical, cognitive, psychosocial history related to current functional performance.  CLINICAL DECISION MAKING: Moderate - several treatment options, min-mod task modification necessary  REHAB POTENTIAL: Good  EVALUATION COMPLEXITY: Moderate    PLAN: OT FREQUENCY: 2x/week  OT DURATION: 8 weeks +eval  PLANNED INTERVENTIONS: self care/ADL training, therapeutic exercise, therapeutic activity, neuromuscular re-education, manual therapy, passive range of motion, balance training, functional mobility training, patient/family education, cognitive remediation/compensation, energy conservation, and DME and/or AE instructions  RECOMMENDED OTHER SERVICES: scheduled for PT eval Thurs.  CONSULTED AND AGREED WITH PLAN OF CARE: Patient and family member/caregiver  PLAN FOR NEXT SESSION:  continue with standing balance for functional tasks, practicing sweeping to simulate raking, simple cooking task   [facility], [place] 01/24/2022, 11:17 AM  [facility] [place]  [address] [phone] phone [phone] 01/24/22 11:17 AM

## 2022-01-24 NOTE — Therapy (Signed)
OUTPATIENT PHYSICAL THERAPY NEURO TREATMENT   Patient Name: Cynthia [name] MRN: [id number] DOB:Sep 20, 1935, 86 y.o., female Today's Date: 01/24/2022  PCP: Jonathon Jordan, MD REFERRING PROVIDER: Domenic Polite, MD    PT End of Session - 01/24/22 1103     Visit Number 7    Number of Visits 17    Date for PT Re-Evaluation 02/14/22   [facility] Auth   Authorization Type [employer]    Authorization Time Period 16 PT visits approved 12/20/21 - 02/14/22    Progress Note Due on Visit 10    PT Start Time 1103    PT Stop Time 1149    PT Time Calculation (min) 46 min    Activity Tolerance Patient tolerated treatment well    Behavior During Therapy [facility] for tasks assessed/performed                Past Medical History:  Diagnosis Date   Anemia    Blood transfusion without reported diagnosis jan 2015   C. difficile colitis 03/05/14, 03/22/14, 04/05/14   recurrent   Cancer ([place])    liver   Cervical cancer ([place]) 1972   Colon cancer ([place]) JAN 2015   Colon cancer ([place])    Complication of anesthesia    slow to awaken in past, DONE WELL RECENTLY   DVT (deep venous thrombosis) (HCC) 10/24/13   Right leg   Guillain-Barre ([place]) 09/2015   Heart murmur    Hypertension    no bp meds    Liver cyst    Stroke ([place]) 12/2014   NUMBNESS ON LEFT SIDE   Tinnitus of both ears ALL THE TIME   Past Surgical History:  Procedure Laterality Date   ABDOMINAL HYSTERECTOMY     vaginal- partial   BACK SURGERY  1981   lower lumbar   ESOPHAGOGASTRODUODENOSCOPY N/A 10/14/2015   Procedure: ESOPHAGOGASTRODUODENOSCOPY (EGD);  Surgeon: Clarene Essex, MD;  Location: [facility] ENDOSCOPY;  Service: Endoscopy;  Laterality: N/A;  Bedside in ICU   IR CV LINE INJECTION  07/20/2020   IR GENERIC HISTORICAL  09/07/2016   IR RADIOLOGIST EVAL & MGMT 09/07/2016 Aletta Edouard, MD [facility] INTERV RAD   IR IMAGING GUIDED PORT INSERTION  07/29/2019   LAPAROSCOPIC RIGHT COLECTOMY Right 10/22/2013   Procedure: LAPAROSCOPIC RIGHT  COLECTOMY ;  Surgeon: [name], MD;  Location: [facility] ORS;  Service: General;  Laterality: Right;   SALPINGOOPHORECTOMY  10/22/2013   Procedure: Lesly Dukes OOPHORECTOMY;  Surgeon: [name]. [name], MD;  Location: [facility] ORS;  Service: Gynecology;;   TUBAL LIGATION  1968   Patient Active Problem List   Diagnosis Date Noted   Pulmonary nodules 12/15/2021   Stroke ([place]) 12/14/2021   Port-A-Cath in place 08/13/2019   Goals of care, counseling/discussion 07/22/2019   Clawfoot, acquired, right 10/31/2016   Poor appetite    Neurogenic bladder    Adjustment disorder with mixed anxiety and depressed mood    Quadriplegia and quadriparesis (HCC)    Numbness and tingling    Paresthesias    UTI (lower urinary tract infection)    History of colon cancer    Acute blood loss anemia    Upper GI bleed    GBS (Guillain-Barre syndrome) (HCC)    Aspiration into airway    Endotracheally intubated    Acute respiratory failure ([place])    Shock ([place]) 10/09/2015   Hemoptysis    Transverse myelitis ([place])    Peripheral neuropathy 10/06/2015   Paresthesia of both hands 10/06/2015   Paresthesia of  both feet 10/06/2015   Cerebral embolism with cerebral infarction 10/06/2015   Ataxia    Colon carcinoma metastatic to liver ([place]) 09/25/2015   Metastasis to liver ([place]) 08/28/2015   Colon cancer metastasized to liver [facility])    Liver lesion    Cerebral infarction due to unspecified mechanism 01/29/2015   B12 deficiency 01/29/2015   Essential hypertension 01/29/2015   Arm numbness left    Disorientation    Numbness and tingling of left side of face    TIA (transient ischemic attack) 01/11/2015   Confusion    Left arm numbness    DVT, lower extremity, distal ([place]) 10/27/2013   T4a, N0 09/27/2013   Iron deficiency anemia 09/04/2013   Symptomatic anemia 09/03/2013   Orthostasis 09/03/2013   Near syncope 09/03/2013    ONSET DATE: 12/15/2021 (referral)  REFERRING DIAG: I63.9 (ICD-10-CM) - Acute CVA  (cerebrovascular accident) ([place])   THERAPY DIAG:  Muscle weakness (generalized)  Unsteadiness on feet  Other abnormalities of gait and mobility  SUBJECTIVE: Pt reports she walked well on Friday at the park but on Saturday fatigued so quickly that daughter had to drive into the park to pick her up. Pt states her L leg went numb and she was unable to move it without dragging it through the park. Spent all day Sunday resting. Otherwise no new changes.                                                                                                                                                                                            PAIN:  Are you having pain? No    PRECAUTIONS: Fall   OBJECTIVE:   TODAY'S TREATMENT:   Self-care/home management  Discussed importance of taking breaks and pacing, as pt fatigued on Saturday likely due to overuse. Pt adamant that she needs to weight her exercises, continued to educate pt on performing exercises for endurance rather than pure strength training and to focus on high reps without weight for now. Pt and daughter state that pt's RLE swells after therapy session (hx of chemo that causes frequent swelling). Encouraged pt to wear compression socks on days following therapy as she likely is not as mobile at home due to fatigue. Pt reports she would like to return to gardening and would like to practice tall kneel > standing transfer in grass. Will perform in later session. Pt's daughter reports pt is not eating enough at home which is likely leading to her poor endurance. Encouraged pt to increase protein intake during day (she likes to eat oatmeal cream pies for breakfast) for increased energy and to provide her  body with the nutrients it needs following therapy sessions. Pt stated she would "make a list and be better".    NMR  In // bars for improved single leg stance, eccentric quad control and BLE strength: -Alt step ups w/contralateral march w/BUE  support, x10 per side. Noted decreased amplitude of march w/LLE and increased difficulty stepping up w/RLE, heavy reliance on BUEs throughout. Pt reported significant numbness/weakness of LLE following intervention.   Ther Ex  SciFit level 1 for 8 minutes w/BLE/BUEs for dynamic cardiovascular conditioning, global strength and endurance. RPE of 5/10 following intervention.   PATIENT EDUCATION: Education details: Continue HEP/walking program WITHOUT WEIGHTS see self-care section  Person educated: Patient and Child(ren) Education method: Explanation, Demonstration, Verbal cues, and Handouts Education comprehension: verbalized understanding and needs further education   HOME EXERCISE PROGRAM: Access Code: [email or web address] URL: [email or web address] Date: 01/06/2022 Prepared by: Mickie Bail Chistopher Mangino  Exercises - Sit to Stand Without Arm Support  - 1 x daily - 7 x weekly - 3 sets - 10 reps - Standing March with Counter Support  - 1 x daily - 7 x weekly - 3 sets - 10 reps - Standing Hip Abduction with Resistance at Ankles and Counter Support  - 1 x daily - 7 x weekly - 3 sets - 10 reps - Standing Hip Extension with Resistance at Ankles and Counter Support  - 1 x daily - 7 x weekly - 3 sets - 10 reps - Side Stepping with red Resistance band at Thighs and Counter Support  - 1 x daily - 7 x weekly - 3 sets - 10 reps - Forward Backward Monster Walk with red Band at Thighs and Counter Support  - 1 x daily - 7 x weekly - 3 sets - 10 reps  GOALS: Goals reviewed with patient? Yes  SHORT TERM GOALS: Target date: 01/20/2022     Pt will be independent with initial HEP for improved strength, balance, transfers and gait.  Baseline: initiated on eval Goal status: IN PROGRESS  2.  Pt will improve gait velocity to at least 2.0 ft/s with LRAD for improved gait efficiency   Baseline: 1.64 ft/s w/RW; 1.88 ft/s with rollator  Goal status: NOT MET  3.  Pt will improve Berg score to 42/56 for decreased  fall risk  Baseline: 37/56; 46/56 Goal status: MET  4.  Pt will improve 5 x STS to less than or equal to 20 seconds without UE support to demonstrate improved functional strength and transfer efficiency.   Baseline: 24.34s w/BUE support; 18.22s without UE support  Goal status: MET  5.  Pt will ambulate 250' on unlevel surface w/S* and LRAD for improved functional mobility and imitation of home environment  Baseline:  Goal status: MET   LONG TERM GOALS: Target date: 02/17/2022     Pt will be independent with final HEP for improved strength, balance, transfers and gait.  Baseline:  Goal status: INITIAL  2. Pt will improve Berg score to 47/56 for decreased fall risk  Baseline: 37/56 Goal status: INITIAL  3. Pt will improve gait velocity to at least 2.2 ft/s with LRAD for improved gait efficiency   Baseline: 1.64 ft/s w/RW; 1.88 ft/s with rollator on 6/6 Goal status: REVISED  4.  Pt will improve 5 x STS to less than or equal to 15 seconds without UE support to demonstrate improved functional strength and transfer efficiency.   Baseline: 24.34s w/BUE support Goal status: INITIAL  5.  Pt will ambulate >500' on level/unlevel  surface w/LRAD mod I for improved independence with functional mobility  Baseline:  Goal status: INITIAL  ASSESSMENT:  CLINICAL IMPRESSION: Emphasis of skilled PT session on single leg stability, BLE strength and endurance. Beginning of session spent educating patient on proper food intake, methods to alleviate LE swelling and reducing intensity on HEP to prioritize endurance training (see self-are section). Pt fatigued very quickly during session and stated her LLE was going numb, which happens when she is fatigued. Will plan on simulating pt working in her garden beds next session, as this is something pt would like to return to. Continue POC.    OBJECTIVE IMPAIRMENTS Abnormal gait, decreased activity tolerance, decreased balance, decreased endurance,  decreased mobility, decreased strength, and impaired sensation.   ACTIVITY LIMITATIONS community activity, driving, meal prep, laundry, medication management, and yard work.   PERSONAL FACTORS Age, Past/current experiences, Time since onset of injury/illness/exacerbation, and 1 comorbidity: hx of Guillain-Barre  are also affecting patient's functional outcome.    REHAB POTENTIAL: Good  CLINICAL DECISION MAKING: Stable/uncomplicated  EVALUATION COMPLEXITY: Low  PLAN: PT FREQUENCY: 2x/week  PT DURATION: 12 weeks  PLANNED INTERVENTIONS: Therapeutic exercises, Therapeutic activity, Neuromuscular re-education, Balance training, Gait training, Patient/Family education, Stair training, DME instructions, and Aquatic Therapy  PLAN FOR NEXT SESSION: How is HEP? Stair training w/SPC, Toe taps to 4" step, gait training with rollator and without AD, rollator on grass/hills, SciFit for endurance, single leg stance, turns, obstacle navigation, eccentric heel taps, hurdles, elevated heels squats. Garden bed simulation.  Cruzita Lederer Jamill Wetmore, PT, DPT 01/24/22, 11:57 AM

## 2022-01-27 ENCOUNTER — Ambulatory Visit: Payer: Medicare PPO | Admitting: Occupational Therapy

## 2022-01-27 ENCOUNTER — Encounter: Payer: Self-pay | Admitting: Occupational Therapy

## 2022-01-27 ENCOUNTER — Ambulatory Visit: Payer: Medicare PPO | Admitting: Physical Therapy

## 2022-01-27 DIAGNOSIS — R2681 Unsteadiness on feet: Secondary | ICD-10-CM | POA: Diagnosis not present

## 2022-01-27 DIAGNOSIS — I69351 Hemiplegia and hemiparesis following cerebral infarction affecting right dominant side: Secondary | ICD-10-CM | POA: Diagnosis not present

## 2022-01-27 DIAGNOSIS — M6281 Muscle weakness (generalized): Secondary | ICD-10-CM | POA: Diagnosis not present

## 2022-01-27 DIAGNOSIS — R278 Other lack of coordination: Secondary | ICD-10-CM | POA: Diagnosis not present

## 2022-01-27 DIAGNOSIS — R2689 Other abnormalities of gait and mobility: Secondary | ICD-10-CM | POA: Diagnosis not present

## 2022-01-27 NOTE — Therapy (Signed)
OUTPATIENT PHYSICAL THERAPY NEURO TREATMENT   Patient Name: Cynthia [name] MRN: [id number] DOB:Nov 16, 1935, 86 y.o., female Today's Date: 01/27/2022  PCP: Jonathon Jordan, MD REFERRING PROVIDER: Domenic Polite, MD    PT End of Session - 01/27/22 1323     Visit Number 8    Number of Visits 17    Date for PT Re-Evaluation 02/14/22   [facility] Auth   Authorization Type [employer]    Authorization Time Period 83 PT visits approved 12/20/21 - 02/14/22    Progress Note Due on Visit 10    PT Start Time 1232    PT Stop Time 2330    PT Time Calculation (min) 43 min    Activity Tolerance Patient tolerated treatment well    Behavior During Therapy [facility] for tasks assessed/performed                 Past Medical History:  Diagnosis Date   Anemia    Blood transfusion without reported diagnosis jan 2015   C. difficile colitis 03/05/14, 03/22/14, 04/05/14   recurrent   Cancer ([place])    liver   Cervical cancer ([place]) 1972   Colon cancer ([place]) JAN 2015   Colon cancer ([place])    Complication of anesthesia    slow to awaken in past, DONE WELL RECENTLY   DVT (deep venous thrombosis) ([place]) 10/24/13   Right leg   Guillain-Barre ([place]) 09/2015   Heart murmur    Hypertension    no bp meds    Liver cyst    Stroke ([place]) 12/2014   NUMBNESS ON LEFT SIDE   Tinnitus of both ears ALL THE TIME   Past Surgical History:  Procedure Laterality Date   ABDOMINAL HYSTERECTOMY     vaginal- partial   BACK SURGERY  1981   lower lumbar   ESOPHAGOGASTRODUODENOSCOPY N/A 10/14/2015   Procedure: ESOPHAGOGASTRODUODENOSCOPY (EGD);  Surgeon: Clarene Essex, MD;  Location: [facility] ENDOSCOPY;  Service: Endoscopy;  Laterality: N/A;  Bedside in ICU   IR CV LINE INJECTION  07/20/2020   IR GENERIC HISTORICAL  09/07/2016   IR RADIOLOGIST EVAL & MGMT 09/07/2016 Aletta Edouard, MD [facility] INTERV RAD   IR IMAGING GUIDED PORT INSERTION  07/29/2019   LAPAROSCOPIC RIGHT COLECTOMY Right 10/22/2013   Procedure: LAPAROSCOPIC RIGHT  COLECTOMY ;  Surgeon: [name], MD;  [facility]S;  Service: General;  Laterality: Right;   SALPINGOOPHORECTOMY  10/22/2013   Procedure: Lesly Dukes OOPHORECTOMY;  Surgeon: [name]. [name], MD;  Location: [facility]S;  Service: Gynecology;;   TUBAL LIGATION  1968   Patient Active Problem List   Diagnosis Date Noted   Pulmonary nodules 12/15/2021   Stroke ([place]) 12/14/2021   Port-A-Cath in place 08/13/2019   Goals of care, counseling/discussion 07/22/2019   Clawfoot, acquired, right 10/31/2016   Poor appetite    Neurogenic bladder    Adjustment disorder with mixed anxiety and depressed mood    Quadriplegia and quadriparesis (HCC)    Numbness and tingling    Paresthesias    UTI (lower urinary tract infection)    History of colon cancer    Acute blood loss anemia    Upper GI bleed    GBS (Guillain-Barre syndrome) (HCC)    Aspiration into airway    Endotracheally intubated    Acute respiratory failure ([place])    Shock ([place]) 10/09/2015   Hemoptysis    Transverse myelitis ([place])    Peripheral neuropathy 10/06/2015   Paresthesia of both hands 10/06/2015   Paresthesia  of both feet 10/06/2015   Cerebral embolism with cerebral infarction 10/06/2015   Ataxia    Colon carcinoma metastatic to liver ([place]) 09/25/2015   Metastasis to liver ([place]) 08/28/2015   Colon cancer metastasized to liver [facility])    Liver lesion    Cerebral infarction due to unspecified mechanism 01/29/2015   B12 deficiency 01/29/2015   Essential hypertension 01/29/2015   Arm numbness left    Disorientation    Numbness and tingling of left side of face    TIA (transient ischemic attack) 01/11/2015   Confusion    Left arm numbness    DVT, lower extremity, distal ([place]) 10/27/2013   T4a, N0 09/27/2013   Iron deficiency anemia 09/04/2013   Symptomatic anemia 09/03/2013   Orthostasis 09/03/2013   Near syncope 09/03/2013    ONSET DATE: 12/15/2021 (referral)  REFERRING DIAG: I63.9 (ICD-10-CM) - Acute CVA  (cerebrovascular accident) ([place])   THERAPY DIAG:  Muscle weakness (generalized)  Unsteadiness on feet  SUBJECTIVE: Pt reports she had another incident in which her LLE went numb while walking (more so than usual) which is very concerning to pt. No other changes to report.                                                                                                                                                                                              PAIN:  Are you having pain? No    PRECAUTIONS: Fall   OBJECTIVE:   TODAY'S TREATMENT:   Self-care/home management  Lengthy discussion regarding potential reasons for the increased numbness in LLE w/exertion (fatigue, history of Guillain-Barre, Hx of CVA, discectomies). Also educated pt on importance of desensitization using PNE principles, as pt very anxious and hyper-aware of numbness. Pt states she does fixate on numbness. Recommended pt alter her walking program to interval training (walking 1 minute at a slow pace, resting for 3 minutes x5) to see if her numbness is being exacerbated by fatigue, as pt is deconditioned. Pt and daughter verbalized understanding.   Ther Ex  STAIRS:  Level of Assistance: SBA Stair Negotiation Technique: Step to Pattern with Bilateral Rails  Number of Stairs: 16   Height of Stairs: 6"  Comments: Pt practiced ascending/descending stairs to determine if it is safe for her to perform at home. Pt demonstrated good foot clearance with each step. Min cues to ascend w/LLE and descend w/RLE, as pt feels as though her LLE is stronger despite it being numb.  GAIT: Gait pattern: step through pattern, decreased stride length, decreased hip/knee flexion- Right, decreased hip/knee flexion- Left, decreased ankle dorsiflexion- Right, decreased ankle dorsiflexion- Left,  and trunk flexed Distance walked: Various clinic distances  Assistive device utilized: Walker - 4 wheeled Level of assistance: SBA Comments: No  cues provided for gait    PATIENT EDUCATION: Education details: Modifying walking program to intervals: walk 1 min, rest for 3 and repeat x5. Keep a log of LLE numbness  Person educated: Patient and Child(ren) Education method: Explanation, Demonstration, and Verbal cues Education comprehension: verbalized understanding and needs further education   HOME EXERCISE PROGRAM: Access Code: [email or web address] URL: [email or web address] Date: 01/06/2022 Prepared by: Mickie Bail Hogan Hoobler  Exercises - Sit to Stand Without Arm Support  - 1 x daily - 7 x weekly - 3 sets - 10 reps - Standing March with Counter Support  - 1 x daily - 7 x weekly - 3 sets - 10 reps - Standing Hip Abduction with Resistance at Ankles and Counter Support  - 1 x daily - 7 x weekly - 3 sets - 10 reps - Standing Hip Extension with Resistance at Ankles and Counter Support  - 1 x daily - 7 x weekly - 3 sets - 10 reps - Side Stepping with red Resistance band at Thighs and Counter Support  - 1 x daily - 7 x weekly - 3 sets - 10 reps - Forward Backward Monster Walk with red Band at Thighs and Counter Support  - 1 x daily - 7 x weekly - 3 sets - 10 reps  GOALS: Goals reviewed with patient? Yes  SHORT TERM GOALS: Target date: 01/20/2022     Pt will be independent with initial HEP for improved strength, balance, transfers and gait.  Baseline: initiated on eval Goal status: IN PROGRESS  2.  Pt will improve gait velocity to at least 2.0 ft/s with LRAD for improved gait efficiency   Baseline: 1.64 ft/s w/RW; 1.88 ft/s with rollator  Goal status: NOT MET  3.  Pt will improve Berg score to 42/56 for decreased fall risk  Baseline: 37/56; 46/56 Goal status: MET  4.  Pt will improve 5 x STS to less than or equal to 20 seconds without UE support to demonstrate improved functional strength and transfer efficiency.   Baseline: 24.34s w/BUE support; 18.22s without UE support  Goal status: MET  5.  Pt will ambulate 250' on  unlevel surface w/S* and LRAD for improved functional mobility and imitation of home environment  Baseline:  Goal status: MET   LONG TERM GOALS: Target date: 02/17/2022     Pt will be independent with final HEP for improved strength, balance, transfers and gait.  Baseline:  Goal status: INITIAL  2. Pt will improve Berg score to 47/56 for decreased fall risk  Baseline: 37/56 Goal status: INITIAL  3. Pt will improve gait velocity to at least 2.2 ft/s with LRAD for improved gait efficiency   Baseline: 1.64 ft/s w/RW; 1.88 ft/s with rollator on 6/6 Goal status: REVISED  4.  Pt will improve 5 x STS to less than or equal to 15 seconds without UE support to demonstrate improved functional strength and transfer efficiency.   Baseline: 24.34s w/BUE support Goal status: INITIAL  5.  Pt will ambulate >500' on level/unlevel surface w/LRAD mod I for improved independence with functional mobility  Baseline:  Goal status: INITIAL  ASSESSMENT:  CLINICAL IMPRESSION: Emphasis of skilled PT session on modifying walking program, stair navigation and educating pt on desensitization using PNE principles. Pt very concerned regarding her LLE numbness increasing w/exertion during exercise. At this time, believe it is due to fatigue  as pt is deconditioned. Modified pt's walking program to prioritize endurance training as pt tends to overdo her exercises. Pt demonstrated good stair navigation and encouraged pt to ascend/descend steps at home for continued strength training and increased confidence. Continue POC.    OBJECTIVE IMPAIRMENTS Abnormal gait, decreased activity tolerance, decreased balance, decreased endurance, decreased mobility, decreased strength, and impaired sensation.   ACTIVITY LIMITATIONS community activity, driving, meal prep, laundry, medication management, and yard work.   PERSONAL FACTORS Age, Past/current experiences, Time since onset of injury/illness/exacerbation, and 1  comorbidity: hx of Guillain-Barre  are also affecting patient's functional outcome.    REHAB POTENTIAL: Good  CLINICAL DECISION MAKING: Stable/uncomplicated  EVALUATION COMPLEXITY: Low  PLAN: PT FREQUENCY: 2x/week  PT DURATION: 12 weeks  PLANNED INTERVENTIONS: Therapeutic exercises, Therapeutic activity, Neuromuscular re-education, Balance training, Gait training, Patient/Family education, Stair training, DME instructions, and Aquatic Therapy  PLAN FOR NEXT SESSION: Any more numbness episodes? How is HEP? Stair training w/SPC, Toe taps to 4" step, gait training with rollator and without AD, rollator on grass/hills, SciFit for endurance, single leg stance, turns, obstacle navigation, eccentric heel taps, hurdles, elevated heels squats. Garden bed simulation.  Cruzita Lederer Doaa Kendzierski, PT, DPT 01/27/22, 1:43 PM

## 2022-01-27 NOTE — Therapy (Signed)
OUTPATIENT OCCUPATIONAL THERAPY NEURO TREATMENT  Patient Name: Cynthia [name] MRN: [id number] DOB:Jan 05, 1936, 86 y.o., female Today's Date: 01/27/2022  PCP: Dr. Jonathon Jordan REFERRING PROVIDER: Domenic Polite, MD; will follow up with  Dr. Metta Clines (neurology)     OT End of Session - 01/27/22 1225     Visit Number 9    Number of Visits 17    Date for OT Re-Evaluation 02/19/22    Authorization Type [employer]; prior auth required    Authorization - Visit Number 9    Authorization - Number of Visits 10    Progress Note Due on Visit 10    OT Start Time 9794    OT Stop Time 1400    OT Time Calculation (min) 43 min    Activity Tolerance Patient tolerated treatment well    Behavior During Therapy [facility] for tasks assessed/performed                   Past Medical History:  Diagnosis Date   Anemia    Blood transfusion without reported diagnosis jan 2015   C. difficile colitis 03/05/14, 03/22/14, 04/05/14   recurrent   Cancer ([place])    liver   Cervical cancer ([place]) 1972   Colon cancer ([place]) JAN 2015   Colon cancer ([place])    Complication of anesthesia    slow to awaken in past, DONE WELL RECENTLY   DVT (deep venous thrombosis) ([place]) 10/24/13   Right leg   Guillain-Barre ([place]) 09/2015   Heart murmur    Hypertension    no bp meds    Liver cyst    Stroke ([place]) 12/2014   NUMBNESS ON LEFT SIDE   Tinnitus of both ears ALL THE TIME   Past Surgical History:  Procedure Laterality Date   ABDOMINAL HYSTERECTOMY     vaginal- partial   BACK SURGERY  1981   lower lumbar   ESOPHAGOGASTRODUODENOSCOPY N/A 10/14/2015   Procedure: ESOPHAGOGASTRODUODENOSCOPY (EGD);  Surgeon: Clarene Essex, MD;  Location: [facility] ENDOSCOPY;  Service: Endoscopy;  Laterality: N/A;  Bedside in ICU   IR CV LINE INJECTION  07/20/2020   IR GENERIC HISTORICAL  09/07/2016   IR RADIOLOGIST EVAL & MGMT 09/07/2016 Aletta Edouard, MD [facility] INTERV RAD   IR IMAGING GUIDED PORT INSERTION  07/29/2019   LAPAROSCOPIC  RIGHT COLECTOMY Right 10/22/2013   Procedure: LAPAROSCOPIC RIGHT COLECTOMY ;  Surgeon: [name], MD;  Location: [facility] ORS;  Service: General;  Laterality: Right;   SALPINGOOPHORECTOMY  10/22/2013   Procedure: Lesly Dukes OOPHORECTOMY;  Surgeon: [name]. [name], MD;  Location: [facility] ORS;  Service: Gynecology;;   TUBAL LIGATION  1968   Patient Active Problem List   Diagnosis Date Noted   Pulmonary nodules 12/15/2021   Stroke ([place]) 12/14/2021   Port-A-Cath in place 08/13/2019   Goals of care, counseling/discussion 07/22/2019   Clawfoot, acquired, right 10/31/2016   Poor appetite    Neurogenic bladder    Adjustment disorder with mixed anxiety and depressed mood    Quadriplegia and quadriparesis (HCC)    Numbness and tingling    Paresthesias    UTI (lower urinary tract infection)    History of colon cancer    Acute blood loss anemia    Upper GI bleed    GBS (Guillain-Barre syndrome) (HCC)    Aspiration into airway    Endotracheally intubated    Acute respiratory failure ([place])    Shock ([place]) 10/09/2015   Hemoptysis    Transverse myelitis ([place])  Peripheral neuropathy 10/06/2015   Paresthesia of both hands 10/06/2015   Paresthesia of both feet 10/06/2015   Cerebral embolism with cerebral infarction 10/06/2015   Ataxia    Colon carcinoma metastatic to liver ([place]) 09/25/2015   Metastasis to liver ([place]) 08/28/2015   Colon cancer metastasized to liver [facility])    Liver lesion    Cerebral infarction due to unspecified mechanism 01/29/2015   B12 deficiency 01/29/2015   Essential hypertension 01/29/2015   Arm numbness left    Disorientation    Numbness and tingling of left side of face    TIA (transient ischemic attack) 01/11/2015   Confusion    Left arm numbness    DVT, lower extremity, distal ([place]) 10/27/2013   T4a, N0 09/27/2013   Iron deficiency anemia 09/04/2013   Symptomatic anemia 09/03/2013   Orthostasis 09/03/2013   Near syncope 09/03/2013    ONSET DATE: 12/14/21    REFERRING DIAG: I63.9 (ICD-10-CM) - Acute CVA (cerebrovascular accident) ([place])   THERAPY DIAG:  Muscle weakness (generalized)  Unsteadiness on feet  Other lack of coordination  Hemiplegia and hemiparesis following cerebral infarction affecting right dominant side (HCC)  SUBJECTIVE:   SUBJECTIVE STATEMENT:  "This leg is acting up"--just incr numbness, no pain.  Pt reports that she has boiled eggs, made egg salad, folded clothes, washed dishes, and can now go down steps to backyard.     Pt accompanied by: daughter   PERTINENT HISTORY: presented to ED with acute onset of RLE weakness.  MRI showed small acute infarcts of L caudate tail and anterior L centrum semiovale, Wearing heart monitor for 1 month (until 01/22/22)  PMH also includes:  hx of Guillian Barre, hx of CVA  (L numbness 2016), hx of colon cancer s/p colectomy with hepatic metastasis, pulmonary nodules,  tinnitus of both ears, paripheral neuropathy  PRECAUTIONS: Fall, 24 hr supervision, active CA (lung--monitoring)  PAIN:  Are you having pain? No  OBJECTIVE:  TODAY'S TREATMENT:   In standing, functional reaching outside base of support laterally with each UE and overhead to place large pegs in vertical pegboard for incr activity tolerance/balance for IADLs with supervision.  Placing beads on golf tees for incr coordination with no significant difficulty.  Practiced writing:  Pt wrote recipe directions with good legibility with incr time.  Ambulating with Rolator to gather items for environmental scanning/navigation from various heights to simulate IADL tasks and for incr activity tolerance.  Only 1 v.c for walker safety/navigation (use countertop for reaching high), supervision provided but no LOB.  Pt with 2/15 items missed initially, but located without cues on 2nd pass.      Arm bike x40mn level 1 forward/backwards for conditioning without rest  Placing grooved pegs in pegboard with R hand with min  difficulty/incr time.      HOME EXERCISE PROGRAM: 01/03/22:  Coordination HEP    GOALS: Potential Goals reviewed with patient? Yes  SHORT TERM GOALS: Target date: 01/18/2022  Pt will be independent with initial HEP. Goal status: MET  2.  Pt will perform BADLs mod I except shower transfer. Goal status:  MET 01/18/22  3.  Pt will perform simple home maintenance task mod I.  Goal status:  ONGOING  01/18/22:  pt reports that she has washed some dishes, but not performing other tasks.  4.  Pt will improve R hand coordination for ADLs as shown improving  completing 9-hole peg test by at least 3sec. Baseline:  26.75sec Goal status: ongoing Rt 25.29s  LONG TERM GOALS: Target date: 02/15/2022  Pt will be independent with updated HEP. Goal status: INITIAL  2.  Pt will perform simple cooking task mod I. Goal status: INITIAL  3.  Pt will perform shower transfer mod I. Goal status: INITIAL  4.  Pt will be able to plant flowers in pot mod I. Goal status: INITIAL  5.  Pt will be able to rake with supervision. Goal status: INITIAL    ASSESSMENT:  CLINICAL IMPRESSION: Pt is progressing towards goals. She demonstrates improving balance and coordination and activity tolerance, but incr LE numbness affected ability to stand for longer periods today and needed to incorporate more seated activities.  PERFORMANCE DEFICITS in functional skills including ADLs, IADLs, coordination, dexterity, FMC, GMC, mobility, balance, endurance, decreased knowledge of use of DME, and UE functional use, cognitive skills including memory.  IMPAIRMENTS are limiting patient from ADLs, IADLs, and leisure.   COMORBIDITIES  hx of CA (cervical, colon, liver cyst), hx of Guillain-Barre Syndrome (09/2015), HTN, hx of CVA that affects occupational performance. Patient will benefit from skilled OT to address above impairments and improve overall function.  MODIFICATION OR ASSISTANCE TO COMPLETE EVALUATION:  Min-Moderate modification of tasks or assist with assess necessary to complete an evaluation.  OT OCCUPATIONAL PROFILE AND HISTORY: Detailed assessment: Review of records and additional review of physical, cognitive, psychosocial history related to current functional performance.  CLINICAL DECISION MAKING: Moderate - several treatment options, min-mod task modification necessary  REHAB POTENTIAL: Good  EVALUATION COMPLEXITY: Moderate    PLAN: OT FREQUENCY: 2x/week  OT DURATION: 8 weeks +eval  PLANNED INTERVENTIONS: self care/ADL training, therapeutic exercise, therapeutic activity, neuromuscular re-education, manual therapy, passive range of motion, balance training, functional mobility training, patient/family education, cognitive remediation/compensation, energy conservation, and DME and/or AE instructions  RECOMMENDED OTHER SERVICES: scheduled for PT eval Thurs.  CONSULTED AND AGREED WITH PLAN OF CARE: Patient and family member/caregiver  PLAN FOR NEXT SESSION:  continue with standing balance for functional tasks, simple cooking task, sweeping/raking, simulate simple gardening task   [facility], [place] 01/27/2022, 1:39 PM  [facility] [address], [place]  [phone] phone [phone] 01/27/22 1:39 PM

## 2022-02-01 ENCOUNTER — Encounter: Payer: Self-pay | Admitting: Physical Therapy

## 2022-02-01 ENCOUNTER — Ambulatory Visit: Payer: Medicare PPO | Admitting: Occupational Therapy

## 2022-02-01 ENCOUNTER — Ambulatory Visit: Payer: Medicare PPO | Admitting: Physical Therapy

## 2022-02-01 DIAGNOSIS — R2681 Unsteadiness on feet: Secondary | ICD-10-CM | POA: Diagnosis not present

## 2022-02-01 DIAGNOSIS — M6281 Muscle weakness (generalized): Secondary | ICD-10-CM | POA: Diagnosis not present

## 2022-02-01 DIAGNOSIS — R278 Other lack of coordination: Secondary | ICD-10-CM

## 2022-02-01 DIAGNOSIS — I69351 Hemiplegia and hemiparesis following cerebral infarction affecting right dominant side: Secondary | ICD-10-CM

## 2022-02-01 DIAGNOSIS — R2689 Other abnormalities of gait and mobility: Secondary | ICD-10-CM

## 2022-02-01 NOTE — Therapy (Unsigned)
OUTPATIENT OCCUPATIONAL THERAPY NEURO TREATMENT  Patient Name: Cynthia [name] MRN: [id number] DOB:1935/11/07, 86 y.o., female Today's Date: 02/01/2022  PCP: Dr. Jonathon Jordan REFERRING PROVIDER: Domenic Polite, MD; will follow up with  Dr. Metta Clines (neurology)     OT End of Session - 02/01/22 1309     Visit Number 10    Number of Visits 17    Date for OT Re-Evaluation 02/19/22    Authorization Type [employer]; prior auth required    Authorization - Visit Number 10    Authorization - Number of Visits 10    Progress Note Due on Visit 10    OT Start Time 1233    OT Stop Time 1313    OT Time Calculation (min) 40 min    Activity Tolerance Patient tolerated treatment well    Behavior During Therapy [facility] for tasks assessed/performed                    Past Medical History:  Diagnosis Date   Anemia    Blood transfusion without reported diagnosis jan 2015   C. difficile colitis 03/05/14, 03/22/14, 04/05/14   recurrent   Cancer ([place])    liver   Cervical cancer ([place]) 1972   Colon cancer ([place]) JAN 2015   Colon cancer ([place])    Complication of anesthesia    slow to awaken in past, DONE WELL RECENTLY   DVT (deep venous thrombosis) ([place]) 10/24/13   Right leg   Guillain-Barre ([place]) 09/2015   Heart murmur    Hypertension    no bp meds    Liver cyst    Stroke ([place]) 12/2014   NUMBNESS ON LEFT SIDE   Tinnitus of both ears ALL THE TIME   Past Surgical History:  Procedure Laterality Date   ABDOMINAL HYSTERECTOMY     vaginal- partial   BACK SURGERY  1981   lower lumbar   ESOPHAGOGASTRODUODENOSCOPY N/A 10/14/2015   Procedure: ESOPHAGOGASTRODUODENOSCOPY (EGD);  Surgeon: Clarene Essex, MD;  Location: [facility] ENDOSCOPY;  Service: Endoscopy;  Laterality: N/A;  Bedside in ICU   IR CV LINE INJECTION  07/20/2020   IR GENERIC HISTORICAL  09/07/2016   IR RADIOLOGIST EVAL & MGMT 09/07/2016 Aletta Edouard, MD [facility] INTERV RAD   IR IMAGING GUIDED PORT INSERTION  07/29/2019    LAPAROSCOPIC RIGHT COLECTOMY Right 10/22/2013   Procedure: LAPAROSCOPIC RIGHT COLECTOMY ;  Surgeon: [name], MD;  Location[facility]S;  Service: General;  Laterality: Right;   SALPINGOOPHORECTOMY  10/22/2013   Procedure: Lesly Dukes OOPHORECTOMY;  Surgeon: [name]. [name], MD;  Location: [facility];  Service: Gynecology;;   TUBAL LIGATION  1968   Patient Active Problem List   Diagnosis Date Noted   Pulmonary nodules 12/15/2021   Stroke ([place]) 12/14/2021   Port-A-Cath in place 08/13/2019   Goals of care, counseling/discussion 07/22/2019   Clawfoot, acquired, right 10/31/2016   Poor appetite    Neurogenic bladder    Adjustment disorder with mixed anxiety and depressed mood    Quadriplegia and quadriparesis (HCC)    Numbness and tingling    Paresthesias    UTI (lower urinary tract infection)    History of colon cancer    Acute blood loss anemia    Upper GI bleed    GBS (Guillain-Barre syndrome) (HCC)    Aspiration into airway    Endotracheally intubated    Acute respiratory failure ([place])    Shock ([place]) 10/09/2015   Hemoptysis    Transverse myelitis ([place])  Peripheral neuropathy 10/06/2015   Paresthesia of both hands 10/06/2015   Paresthesia of both feet 10/06/2015   Cerebral embolism with cerebral infarction 10/06/2015   Ataxia    Colon carcinoma metastatic to liver ([place]) 09/25/2015   Metastasis to liver ([place]) 08/28/2015   Colon cancer metastasized to liver [facility])    Liver lesion    Cerebral infarction due to unspecified mechanism 01/29/2015   B12 deficiency 01/29/2015   Essential hypertension 01/29/2015   Arm numbness left    Disorientation    Numbness and tingling of left side of face    TIA (transient ischemic attack) 01/11/2015   Confusion    Left arm numbness    DVT, lower extremity, distal ([place]) 10/27/2013   T4a, N0 09/27/2013   Iron deficiency anemia 09/04/2013   Symptomatic anemia 09/03/2013   Orthostasis 09/03/2013   Near syncope 09/03/2013    ONSET  DATE: 12/14/21   REFERRING DIAG: I63.9 (ICD-10-CM) - Acute CVA (cerebrovascular accident) ([place])   THERAPY DIAG:  Muscle weakness (generalized)  Unsteadiness on feet  Other lack of coordination  Hemiplegia and hemiparesis following cerebral infarction affecting right dominant side (HCC)  Other abnormalities of gait and mobility  SUBJECTIVE:   SUBJECTIVE STATEMENT:  Pt denies pain , Pt accompanied by: daughter   PERTINENT HISTORY: presented to ED with acute onset of RLE weakness.  MRI showed small acute infarcts of L caudate tail and anterior L centrum semiovale, Wearing heart monitor for 1 month (until 01/22/22)  PMH also includes:  hx of Guillian Barre, hx of CVA  (L numbness 2016), hx of colon cancer s/p colectomy with hepatic metastasis, pulmonary nodules,  tinnitus of both ears, paripheral neuropathy  PRECAUTIONS: Fall, 24 hr supervision, active CA (lung--monitoring)  PAIN:  Are you having pain? No  OBJECTIVE:  TODAY'S TREATMENT:    Ambulating with Rolator to gather items for environmental scanning/navigation to simulate IADL tasks and for incr activity tolerance.   4/12 items missed on first pass, min v.c for rollator safety.  In standing, functional reaching outside base of support laterally and to lower surface with each UE and overhead to place small  pegs in vertical pegboard for incr fine motor coordination and  activity tolerance/balance , supervision provided  Pt reports she cooked an egg at home today with supervision and pt reports no issues.  Pt  has a raised flower bed at home, therapist discussed with pt/ dtr use of the rollator as a seat for planing in raised flower bed.    Gripper set at level 2 to pick up 1 inch blocks with RUE, for sustained grip min difficulty  Arm bike x59mn level  forward/backwards for conditioning without rest      HOME EXERCISE PROGRAM: 01/03/22:  Coordination HEP    GOALS: Potential Goals reviewed with patient?  Yes  SHORT TERM GOALS: Target date: 01/18/2022  Pt will be independent with initial HEP. Goal status: MET  2.  Pt will perform BADLs mod I except shower transfer. Goal status:  MET 01/18/22  3.  Pt will perform simple home maintenance task mod I.  Goal status:  ONGOING  01/18/22:  pt reports that she has washed some dishes, but not performing other tasks.  4.  Pt will improve R hand coordination for ADLs as shown improving  completing 9-hole peg test by at least 3sec. Baseline:  26.75sec Goal status: ongoing Rt 25.29s    LONG TERM GOALS: Target date: 02/15/2022  Pt will be independent with updated HEP.  Goal status:  ongoing  2.  Pt will perform simple cooking task mod I. Goal status: ongoing   3.  Pt will perform shower transfer mod I. Goal status:  ongoing min A  4.  Pt will be able to plant flowers in pot mod I. Goal status: ongoing  5.  Pt will be able to rake with supervision. Goal status: ongoing    ASSESSMENT:  CLINICAL IMPRESSION: For the reporting period of  12/21/21-02/01/22 Pt is progressing towards goals. She demonstrates improving balance and coordination and activity tolerance. Pt can benefit from continued skilled occupational therapy to address the following deficits:decreased activity tolerance, decreased functional mobility, decreased standing balance, decreased coordination which impedes performance of ADLs/IADLs. Pt has achieved 2/4 short term goals. PERFORMANCE DEFICITS in functional skills including ADLs, IADLs, coordination, dexterity, FMC, GMC, mobility, balance, endurance, decreased knowledge of use of DME, and UE functional use, cognitive skills including memory.  IMPAIRMENTS are limiting patient from ADLs, IADLs, and leisure.   COMORBIDITIES  hx of CA (cervical, colon, liver cyst), hx of Guillain-Barre Syndrome (09/2015), HTN, hx of CVA that affects occupational performance. Patient will benefit from skilled OT to address above impairments and improve  overall function.  MODIFICATION OR ASSISTANCE TO COMPLETE EVALUATION: Min-Moderate modification of tasks or assist with assess necessary to complete an evaluation.  OT OCCUPATIONAL PROFILE AND HISTORY: Detailed assessment: Review of records and additional review of physical, cognitive, psychosocial history related to current functional performance.  CLINICAL DECISION MAKING: Moderate - several treatment options, min-mod task modification necessary  REHAB POTENTIAL: Good  EVALUATION COMPLEXITY: Moderate    PLAN: OT FREQUENCY: 2x/week  OT DURATION: 8 weeks +eval  PLANNED INTERVENTIONS: self care/ADL training, therapeutic exercise, therapeutic activity, neuromuscular re-education, manual therapy, passive range of motion, balance training, functional mobility training, patient/family education, cognitive remediation/compensation, energy conservation, and DME and/or AE instructions  RECOMMENDED OTHER SERVICES: scheduled for PT eval Thurs.  CONSULTED AND AGREED WITH PLAN OF CARE: Patient and family member/caregiver  PLAN FOR NEXT SESSION:  continue with standing balance for functional tasks, simple cooking task, sweeping/raking, simulate simple gardening task   Olive Zmuda, OT 02/01/2022, 1:09 PM  [facility] [address] [place], [place]  [address] [phone] phone [phone] 02/01/22 1:09 PM

## 2022-02-01 NOTE — Therapy (Signed)
OUTPATIENT PHYSICAL THERAPY NEURO TREATMENT   Patient Name: Cynthia [name] MRN: [id number] DOB:11-25-1935, 86 y.o., female Today's Date: 02/01/2022  PCP: Jonathon Jordan, MD REFERRING PROVIDER: Domenic Polite, MD    PT End of Session - 02/01/22 1139     Visit Number 9    Number of Visits 17    Date for PT Re-Evaluation 02/14/22   [facility] Auth   Authorization Type [employer]    Authorization Time Period 46 PT visits approved 12/20/21 - 02/14/22    Progress Note Due on Visit 10    PT Start Time 1137    PT Stop Time 1223    PT Time Calculation (min) 46 min    Equipment Utilized During Treatment Gait belt    Activity Tolerance Patient tolerated treatment well    Behavior During Therapy [facility] for tasks assessed/performed                 Past Medical History:  Diagnosis Date   Anemia    Blood transfusion without reported diagnosis jan 2015   C. difficile colitis 03/05/14, 03/22/14, 04/05/14   recurrent   Cancer ([place])    liver   Cervical cancer ([place]) 1972   Colon cancer ([place]) JAN 2015   Colon cancer ([place])    Complication of anesthesia    slow to awaken in past, DONE WELL RECENTLY   DVT (deep venous thrombosis) ([place]) 10/24/13   Right leg   Guillain-Barre ([place]) 09/2015   Heart murmur    Hypertension    no bp meds    Liver cyst    Stroke ([place]) 12/2014   NUMBNESS ON LEFT SIDE   Tinnitus of both ears ALL THE TIME   Past Surgical History:  Procedure Laterality Date   ABDOMINAL HYSTERECTOMY     vaginal- partial   BACK SURGERY  1981   lower lumbar   ESOPHAGOGASTRODUODENOSCOPY N/A 10/14/2015   Procedure: ESOPHAGOGASTRODUODENOSCOPY (EGD);  Surgeon: Clarene Essex, MD;  Location: [facility] ENDOSCOPY;  Service: Endoscopy;  Laterality: N/A;  Bedside in ICU   IR CV LINE INJECTION  07/20/2020   IR GENERIC HISTORICAL  09/07/2016   IR RADIOLOGIST EVAL & MGMT 09/07/2016 Aletta Edouard, MD [facility] INTERV RAD   IR IMAGING GUIDED PORT INSERTION  07/29/2019   LAPAROSCOPIC RIGHT COLECTOMY  Right 10/22/2013   Procedure: LAPAROSCOPIC RIGHT COLECTOMY ;  Surgeon: [name], MD;  Location: [facility] ORS;  Service: General;  Laterality: Right;   SALPINGOOPHORECTOMY  10/22/2013   Procedure: Lesly Dukes OOPHORECTOMY;  Surgeon: [name]. [name], MD;  Location: [facility] ORS;  Service: Gynecology;;   TUBAL LIGATION  1968   Patient Active Problem List   Diagnosis Date Noted   Pulmonary nodules 12/15/2021   Stroke ([place]) 12/14/2021   Port-A-Cath in place 08/13/2019   Goals of care, counseling/discussion 07/22/2019   Clawfoot, acquired, right 10/31/2016   Poor appetite    Neurogenic bladder    Adjustment disorder with mixed anxiety and depressed mood    Quadriplegia and quadriparesis (HCC)    Numbness and tingling    Paresthesias    UTI (lower urinary tract infection)    History of colon cancer    Acute blood loss anemia    Upper GI bleed    GBS (Guillain-Barre syndrome) (HCC)    Aspiration into airway    Endotracheally intubated    Acute respiratory failure ([place])    Shock ([place]) 10/09/2015   Hemoptysis    Transverse myelitis ([place])    Peripheral neuropathy 10/06/2015  Paresthesia of both hands 10/06/2015   Paresthesia of both feet 10/06/2015   Cerebral embolism with cerebral infarction 10/06/2015   Ataxia    Colon carcinoma metastatic to liver ([place]) 09/25/2015   Metastasis to liver ([place]) 08/28/2015   Colon cancer metastasized to liver [facility])    Liver lesion    Cerebral infarction due to unspecified mechanism 01/29/2015   B12 deficiency 01/29/2015   Essential hypertension 01/29/2015   Arm numbness left    Disorientation    Numbness and tingling of left side of face    TIA (transient ischemic attack) 01/11/2015   Confusion    Left arm numbness    DVT, lower extremity, distal ([place]) 10/27/2013   T4a, N0 09/27/2013   Iron deficiency anemia 09/04/2013   Symptomatic anemia 09/03/2013   Orthostasis 09/03/2013   Near syncope 09/03/2013    ONSET DATE: 12/15/2021  (referral)  REFERRING DIAG: I63.9 (ICD-10-CM) - Acute CVA (cerebrovascular accident) ([place])   THERAPY DIAG:  Muscle weakness (generalized)  Unsteadiness on feet  Other lack of coordination  Hemiplegia and hemiparesis following cerebral infarction affecting right dominant side (HCC)  Other abnormalities of gait and mobility  SUBJECTIVE: Pt and daughter report the leaning to the right is worse with fatigue.  Pt states she can tell a big difference in her N/T if she misses her gabapentin dose.  The N/T persists in the LLE particularly.   Have walked a little bit, she tried walking in the park, but feels she overdid it.  She is trying to do walking program in home when not able to go to the park.  HEP is going well.                                                                                                                                                                                            PAIN:  Are you having pain? No  Just N/T  PRECAUTIONS: Fall  OBJECTIVE:   TODAY'S TREATMENT:  STAIRS:  Level of Assistance: SBA Stair Negotiation Technique: Step to Pattern with Single Rail on Right Single Rail on Left  Number of Stairs: 8   Height of Stairs: 6"  Comments: Practiced ascending and descending stairs using SPC on right x1 trial vs on left x1 trial due to pt feeling her LLE is weaker.  No overt differences in safety, LOB, or knee buckling noted.  Pt would benefit from further practice using cane on right side as she prefers with stair negotiation when a rail is not present to simulate community environments as well as daughters house.  GAIT: Gait pattern: step through pattern, decreased stride length,  decreased hip/knee flexion- Right, decreased hip/knee flexion- Left, decreased ankle dorsiflexion- Right, decreased ankle dorsiflexion- Left, and trunk flexed Distance walked: 50' (cane on right) + 70' (cane on left) + 115' (cane on right-pt preference due to perceived worse  weakness on LLE) Assistive device utilized: Single point cane Level of assistance: SBA Comments: No overt LOB or major differences noted when using SPC on left vs right.  Pt demonstrates step-to with greater difficulty sequencing initially when cane on left, able to progress gait pattern w/ inc distance and cane on left.  Edu provided on use of the cane on the left due to most acute episode of stroke and right functional weakness, but to use best judgement if weakness/N/T waiver from day to day.  4" step taps progressed to no UE support 5x10 Static hold on airex normal BOS x75mn > feet together x14m > alt LE cone taps on airex w/ LUE support only  PATIENT EDUCATION: Education details: Re-iterated:  Modifying walking program to intervals: walk 1 min, rest for 3 and repeat x5. Keep a log of LLE numbness.  Discussed use of SPC on in special cases like staircase without rails at daughter's house using opposite handheld assist as needed, otherwise rollator for all unlevel, distracting or far distances. Person educated: Patient and Child(ren) Education method: Explanation, Demonstration, and Verbal cues Education comprehension: verbalized understanding and needs further education   HOME EXERCISE PROGRAM: Access Code: [email or web address]: [email or web address] Date: 01/06/2022 Prepared by: JaMickie Baillaster  Exercises - Sit to Stand Without Arm Support  - 1 x daily - 7 x weekly - 3 sets - 10 reps - Standing March with Counter Support  - 1 x daily - 7 x weekly - 3 sets - 10 reps - Standing Hip Abduction with Resistance at Ankles and Counter Support  - 1 x daily - 7 x weekly - 3 sets - 10 reps - Standing Hip Extension with Resistance at Ankles and Counter Support  - 1 x daily - 7 x weekly - 3 sets - 10 reps - Side Stepping with red Resistance band at Thighs and Counter Support  - 1 x daily - 7 x weekly - 3 sets - 10 reps - Forward Backward Monster Walk with red Band at Thighs and Counter  Support  - 1 x daily - 7 x weekly - 3 sets - 10 reps  GOALS: Goals reviewed with patient? Yes  SHORT TERM GOALS: Target date: 01/20/2022     Pt will be independent with initial HEP for improved strength, balance, transfers and gait.  Baseline: initiated on eval Goal status: IN PROGRESS  2.  Pt will improve gait velocity to at least 2.0 ft/s with LRAD for improved gait efficiency   Baseline: 1.64 ft/s w/RW; 1.88 ft/s with rollator  Goal status: NOT MET  3.  Pt will improve Berg score to 42/56 for decreased fall risk  Baseline: 37/56; 46/56 Goal status: MET  4.  Pt will improve 5 x STS to less than or equal to 20 seconds without UE support to demonstrate improved functional strength and transfer efficiency.   Baseline: 24.34s w/BUE support; 18.22s without UE support  Goal status: MET  5.  Pt will ambulate 250' on unlevel surface w/S* and LRAD for improved functional mobility and imitation of home environment  Baseline:  Goal status: MET   LONG TERM GOALS: Target date: 02/17/2022     Pt will be independent with final HEP for improved strength, balance, transfers and gait.  Baseline:  Goal status: INITIAL  2. Pt will improve Berg score to 47/56 for decreased fall risk  Baseline: 37/56 Goal status: INITIAL  3. Pt will improve gait velocity to at least 2.2 ft/s with LRAD for improved gait efficiency   Baseline: 1.64 ft/s w/RW; 1.88 ft/s with rollator on 6/6 Goal status: REVISED  4.  Pt will improve 5 x STS to less than or equal to 15 seconds without UE support to demonstrate improved functional strength and transfer efficiency.   Baseline: 24.34s w/BUE support Goal status: INITIAL  5.  Pt will ambulate >500' on level/unlevel surface w/LRAD mod I for improved independence with functional mobility  Baseline:  Goal status: INITIAL  ASSESSMENT:  CLINICAL IMPRESSION: Focus of skilled session on gait training with SPC on level surface and stairs to promote safety  primarily in environmentally specific situations like the patient's daughter's house.  Pt did well using SPC on both sides, but PT continued to provide education on proper LE sequencing to promote most functionally safe pattern for stair management.  Will continue to progress activity tolerance, standing balance, and other deficits as able per POC.   OBJECTIVE IMPAIRMENTS Abnormal gait, decreased activity tolerance, decreased balance, decreased endurance, decreased mobility, decreased strength, and impaired sensation.   ACTIVITY LIMITATIONS community activity, driving, meal prep, laundry, medication management, and yard work.   PERSONAL FACTORS Age, Past/current experiences, Time since onset of injury/illness/exacerbation, and 1 comorbidity: hx of Guillain-Barre  are also affecting patient's functional outcome.    REHAB POTENTIAL: Good  CLINICAL DECISION MAKING: Stable/uncomplicated  EVALUATION COMPLEXITY: Low  PLAN: PT FREQUENCY: 2x/week  PT DURATION: 12 weeks  PLANNED INTERVENTIONS: Therapeutic exercises, Therapeutic activity, Neuromuscular re-education, Balance training, Gait training, Patient/Family education, Stair training, DME instructions, and Aquatic Therapy  PLAN FOR NEXT SESSION: Any more numbness episodes? How is HEP? Stair training w/SPC, Toe taps to 4" step, gait training with rollator and without AD, rollator on grass/hills, SciFit for endurance, single leg stance, turns, obstacle navigation, eccentric heel taps, hurdles, elevated heels squats. Garden bed simulation.  Cruzita Lederer Plaster, PT, DPT 02/01/22, 2:21 PM

## 2022-02-03 ENCOUNTER — Ambulatory Visit: Payer: Medicare PPO | Admitting: Physical Therapy

## 2022-02-03 ENCOUNTER — Encounter: Payer: Self-pay | Admitting: Occupational Therapy

## 2022-02-03 ENCOUNTER — Ambulatory Visit: Payer: Medicare PPO | Admitting: Occupational Therapy

## 2022-02-03 DIAGNOSIS — M6281 Muscle weakness (generalized): Secondary | ICD-10-CM

## 2022-02-03 DIAGNOSIS — R2689 Other abnormalities of gait and mobility: Secondary | ICD-10-CM

## 2022-02-03 DIAGNOSIS — R278 Other lack of coordination: Secondary | ICD-10-CM

## 2022-02-03 DIAGNOSIS — R2681 Unsteadiness on feet: Secondary | ICD-10-CM

## 2022-02-03 DIAGNOSIS — I69351 Hemiplegia and hemiparesis following cerebral infarction affecting right dominant side: Secondary | ICD-10-CM | POA: Diagnosis not present

## 2022-02-03 NOTE — Therapy (Signed)
OUTPATIENT PHYSICAL THERAPY NEURO TREATMENT- 10TH VISIT PROGRESS NOTE   Patient Name: Cynthia [name] MRN: [id number] DOB:[birth year], 86 y.o., female Today's Date: 02/03/2022  PCP: Jonathon Jordan, MD REFERRING PROVIDER: Domenic Polite, MD   Physical Therapy Progress Note   Dates of Reporting Period:12/23/21 - 02/03/22  See Note below for Objective Data and Assessment of Progress/Goals.  Thank you for the referral of this patient. Mickie Bail Ulani Degrasse, PT, DPT    PT End of Session - 02/03/22 1103     Visit Number 10    Number of Visits 17    Date for PT Re-Evaluation 02/14/22   [employer] Auth   Authorization Type [employer]    Authorization Time Period 16 PT visits approved 12/20/21 - 02/14/22    Progress Note Due on Visit 10    PT Start Time 1101    PT Stop Time 1144    PT Time Calculation (min) 43 min    Equipment Utilized During Treatment Gait belt    Activity Tolerance Patient tolerated treatment well    Behavior During Therapy WFL for tasks assessed/performed                  Past Medical History:  Diagnosis Date   Anemia    Blood transfusion without reported diagnosis jan 2015   C. difficile colitis 03/05/14, 03/22/14, 04/05/14   recurrent   Cancer ([place])    liver   Cervical cancer ([place]) 1972   Colon cancer ([place]) JAN 2015   Colon cancer ([place])    Complication of anesthesia    slow to awaken in past, DONE WELL RECENTLY   DVT (deep venous thrombosis) (HCC) 10/24/13   Right leg   Guillain-Barre ([place]) 09/2015   Heart murmur    Hypertension    no bp meds    Liver cyst    Stroke ([place]) 12/2014   NUMBNESS ON LEFT SIDE   Tinnitus of both ears ALL THE TIME   Past Surgical History:  Procedure Laterality Date   ABDOMINAL HYSTERECTOMY     vaginal- partial   BACK SURGERY  1981   lower lumbar   ESOPHAGOGASTRODUODENOSCOPY N/A 10/14/2015   Procedure: ESOPHAGOGASTRODUODENOSCOPY (EGD);  Surgeon: Clarene Essex, MD;  Location: [facility] ENDOSCOPY;  Service: Endoscopy;   Laterality: N/A;  Bedside in ICU   IR CV LINE INJECTION  07/20/2020   IR GENERIC HISTORICAL  09/07/2016   IR RADIOLOGIST EVAL & MGMT 09/07/2016 Aletta Edouard, MD [facility] INTERV RAD   IR IMAGING GUIDED PORT INSERTION  07/29/2019   LAPAROSCOPIC RIGHT COLECTOMY Right 10/22/2013   Procedure: LAPAROSCOPIC RIGHT COLECTOMY ;  Surgeon: [name], MD;  Location: [facility] ORS;  Service: General;  Laterality: Right;   SALPINGOOPHORECTOMY  10/22/2013   Procedure: Lesly Dukes OOPHORECTOMY;  Surgeon: [name]. [name], MD;  Location: [facility] ORS;  Service: Gynecology;;   TUBAL LIGATION  1968   Patient Active Problem List   Diagnosis Date Noted   Pulmonary nodules 12/15/2021   Stroke ([place]) 12/14/2021   Port-A-Cath in place 08/13/2019   Goals of care, counseling/discussion 07/22/2019   Clawfoot, acquired, right 10/31/2016   Poor appetite    Neurogenic bladder    Adjustment disorder with mixed anxiety and depressed mood    Quadriplegia and quadriparesis (HCC)    Numbness and tingling    Paresthesias    UTI (lower urinary tract infection)    History of colon cancer    Acute blood loss anemia    Upper GI bleed    GBS (  Guillain-Barre syndrome) (HCC)    Aspiration into airway    Endotracheally intubated    Acute respiratory failure (HCC)    Shock ([place]) 10/09/2015   Hemoptysis    Transverse myelitis (HCC)    Peripheral neuropathy 10/06/2015   Paresthesia of both hands 10/06/2015   Paresthesia of both feet 10/06/2015   Cerebral embolism with cerebral infarction 10/06/2015   Ataxia    Colon carcinoma metastatic to liver ([place]) 09/25/2015   Metastasis to liver ([place]) 08/28/2015   Colon cancer metastasized to liver [facility])    Liver lesion    Cerebral infarction due to unspecified mechanism 01/29/2015   B12 deficiency 01/29/2015   Essential hypertension 01/29/2015   Arm numbness left    Disorientation    Numbness and tingling of left side of face    TIA (transient ischemic attack) 01/11/2015    Confusion    Left arm numbness    DVT, lower extremity, distal ([place]) 10/27/2013   T4a, N0 09/27/2013   Iron deficiency anemia 09/04/2013   Symptomatic anemia 09/03/2013   Orthostasis 09/03/2013   Near syncope 09/03/2013    ONSET DATE: 12/15/2021 (referral)  REFERRING DIAG: I63.9 (ICD-10-CM) - Acute CVA (cerebrovascular accident) ([place])   THERAPY DIAG:  Muscle weakness (generalized)  Unsteadiness on feet  Other lack of coordination  SUBJECTIVE: Pt and daughter report they have not been walking much this week due to weather. Exercises are going well.                                                                                                                                                                                             PAIN:  Are you having pain? No    PRECAUTIONS: Fall  OBJECTIVE:   TODAY'S TREATMENT:  Ther Ex  SciFit level 3 for 7 minutes w/BUE/BLEs for dynamic cardiovascular warmup, endurance and global strengthening. RPE of 5/10 following activity   Gait Training  Gait pattern: step through pattern, decreased arm swing- Right, decreased arm swing- Left, decreased stride length, decreased hip/knee flexion- Right, decreased hip/knee flexion- Left, and lateral lean- Left Distance walked: 115' x2  Assistive device utilized: Single point cane Level of assistance: SBA Comments: Pt ambulated first lap holding cane on R side (her preference). No instability noted. Pt ambulated second lap holding cane on L side per therapist request, as her RLE is weaker than LLE. Pt unable to sequence cane when holding on L side, demonstrated ipsilateral cane placement and step rather than reciprocal pattern. Pt also appeared unstable when holding on L side, predominantly losing balance on turns. Encouraging pt to hold cane on R side due  to pt preference and stability.    NMR  6" hurdle navigation w/4" step up/down using SPC on R side x3 w/CGA for improved obstacle navigation and  practice placement of SPC. Min cues to remind pt to place cane up on step first and to keep cane ahead of her when navigating hurdles. Pt able to clear hurdles w/no instability, alternating leading foot each time.   Cone weaving w/SPC to practice turns and proper foot placement, as pt tends to cross-over step w/turns and throws herself off balance, CGA throughout for safety. Noted pt maintains very narrow BOS w/turns despite multimodal cues. However, pt improved leading w/foot on same direction as turn w/extra practice.    PATIENT EDUCATION: Education details: Re-iterated:  Modifying walking program to intervals: walk 1 min, rest for 3 and repeat x5. Importance of pacing activity and allowing body to rest, as pt does not recover well  Person educated: Patient and Child(ren) Education method: Explanation, Demonstration, and Verbal cues Education comprehension: verbalized understanding and needs further education   HOME EXERCISE PROGRAM: Access Code: [email or web address] URL: [email or web address] Date: 01/06/2022 Prepared by: Mickie Bail Rogenia Werntz  Exercises - Sit to Stand Without Arm Support  - 1 x daily - 7 x weekly - 3 sets - 10 reps - Standing March with Counter Support  - 1 x daily - 7 x weekly - 3 sets - 10 reps - Standing Hip Abduction with Resistance at Ankles and Counter Support  - 1 x daily - 7 x weekly - 3 sets - 10 reps - Standing Hip Extension with Resistance at Ankles and Counter Support  - 1 x daily - 7 x weekly - 3 sets - 10 reps - Side Stepping with red Resistance band at Thighs and Counter Support  - 1 x daily - 7 x weekly - 3 sets - 10 reps - Forward Backward Monster Walk with red Band at Thighs and Counter Support  - 1 x daily - 7 x weekly - 3 sets - 10 reps  GOALS: Goals reviewed with patient? Yes  SHORT TERM GOALS: Target date: 01/20/2022     Pt will be independent with initial HEP for improved strength, balance, transfers and gait.  Baseline: initiated on eval Goal  status: IN PROGRESS  2.  Pt will improve gait velocity to at least 2.0 ft/s with LRAD for improved gait efficiency   Baseline: 1.64 ft/s w/RW; 1.88 ft/s with rollator  Goal status: NOT MET  3.  Pt will improve Berg score to 42/56 for decreased fall risk  Baseline: 37/56; 46/56 Goal status: MET  4.  Pt will improve 5 x STS to less than or equal to 20 seconds without UE support to demonstrate improved functional strength and transfer efficiency.   Baseline: 24.34s w/BUE support; 18.22s without UE support  Goal status: MET  5.  Pt will ambulate 250' on unlevel surface w/S* and LRAD for improved functional mobility and imitation of home environment  Baseline:  Goal status: MET   LONG TERM GOALS: Target date: 02/17/2022     Pt will be independent with final HEP for improved strength, balance, transfers and gait.  Baseline:  Goal status: INITIAL  2. Pt will improve Berg score to 47/56 for decreased fall risk  Baseline: 37/56 Goal status: INITIAL  3. Pt will improve gait velocity to at least 2.2 ft/s with LRAD for improved gait efficiency   Baseline: 1.64 ft/s w/RW; 1.88 ft/s with rollator on 6/6 Goal status: REVISED  4.  Pt will  improve 5 x STS to less than or equal to 15 seconds without UE support to demonstrate improved functional strength and transfer efficiency.   Baseline: 24.34s w/BUE support Goal status: INITIAL  5.  Pt will ambulate >500' on level/unlevel surface w/LRAD mod I for improved independence with functional mobility  Baseline:  Goal status: INITIAL  ASSESSMENT:  CLINICAL IMPRESSION: Emphasis of skilled PT session on endurance and gait training w/SPC over various obstacles. Trialed holding cane on R side vs L, as pt's RLE weaker but pt prefers to hold cane on R side. Pt unable to sequence cane well w/LUE and frequently tripped over feet when cane was on L side. For now, recommending pt hold cane on R side. Pt demonstrates difficulty w/turns, exhibiting  narrow BOS and cross-over steps. Will continue to practice for improved safety. Continue POC.    OBJECTIVE IMPAIRMENTS Abnormal gait, decreased activity tolerance, decreased balance, decreased endurance, decreased mobility, decreased strength, and impaired sensation.   ACTIVITY LIMITATIONS community activity, driving, meal prep, laundry, medication management, and yard work.   PERSONAL FACTORS Age, Past/current experiences, Time since onset of injury/illness/exacerbation, and 1 comorbidity: hx of Guillain-Barre  are also affecting patient's functional outcome.    REHAB POTENTIAL: Good  CLINICAL DECISION MAKING: Stable/uncomplicated  EVALUATION COMPLEXITY: Low  PLAN: PT FREQUENCY: 2x/week  PT DURATION: 12 weeks  PLANNED INTERVENTIONS: Therapeutic exercises, Therapeutic activity, Neuromuscular re-education, Balance training, Gait training, Patient/Family education, Stair training, DME instructions, and Aquatic Therapy  PLAN FOR NEXT SESSION: Any more numbness episodes? How is HEP? Stair training w/SPC, Toe taps to 4" step, gait training with rollator and without AD, rollator on grass/hills, SciFit for endurance, single leg stance, turns, obstacle navigation, eccentric heel taps, hurdles, elevated heels squats. Garden bed simulation. Practice w/turns   Cruzita Lederer Anzleigh Slaven, PT, DPT 02/03/22, 11:48 AM

## 2022-02-03 NOTE — Therapy (Signed)
OUTPATIENT OCCUPATIONAL THERAPY NEURO TREATMENT  Patient Name: Cynthia [name] MRN: [id number] DOB:[birth year], 86 y.o., female Today's Date: 02/03/2022  PCP: Dr. Jonathon Jordan REFERRING PROVIDER: Domenic Polite, MD; will follow up with  Dr. Metta Clines (neurology)     OT End of Session - 02/03/22 1145     Visit Number 11    Number of Visits 17    Date for OT Re-Evaluation 02/19/22    Authorization Type [employer]; prior auth required    Authorization - Visit Number 10    Authorization - Number of Visits 10    Progress Note Due on Visit 10    OT Start Time 1194    OT Stop Time 1225    OT Time Calculation (min) 40 min    Activity Tolerance Patient tolerated treatment well    Behavior During Therapy [facility] for tasks assessed/performed                    Past Medical History:  Diagnosis Date   Anemia    Blood transfusion without reported diagnosis jan 2015   C. difficile colitis 03/05/14, 03/22/14, 04/05/14   recurrent   Cancer ([place])    liver   Cervical cancer ([place]) 1972   Colon cancer ([place]) JAN 2015   Colon cancer ([place])    Complication of anesthesia    slow to awaken in past, DONE WELL RECENTLY   DVT (deep venous thrombosis) ([place]) 10/24/13   Right leg   Guillain-Barre ([place]) 09/2015   Heart murmur    Hypertension    no bp meds    Liver cyst    Stroke ([place]) 12/2014   NUMBNESS ON LEFT SIDE   Tinnitus of both ears ALL THE TIME   Past Surgical History:  Procedure Laterality Date   ABDOMINAL HYSTERECTOMY     vaginal- partial   BACK SURGERY  1981   lower lumbar   ESOPHAGOGASTRODUODENOSCOPY N/A 10/14/2015   Procedure: ESOPHAGOGASTRODUODENOSCOPY (EGD);  Surgeon: Clarene Essex, MD;  Location: [facility] ENDOSCOPY;  Service: Endoscopy;  Laterality: N/A;  Bedside in ICU   IR CV LINE INJECTION  07/20/2020   IR GENERIC HISTORICAL  09/07/2016   IR RADIOLOGIST EVAL & MGMT 09/07/2016 Aletta Edouard, MD [facility] INTERV RAD   IR IMAGING GUIDED PORT INSERTION  07/29/2019    LAPAROSCOPIC RIGHT COLECTOMY Right 10/22/2013   Procedure: LAPAROSCOPIC RIGHT COLECTOMY ;  Surgeon: [name], MD;  Location: [facility] ORS;  Service: General;  Laterality: Right;   SALPINGOOPHORECTOMY  10/22/2013   Procedure: Lesly Dukes OOPHORECTOMY;  Surgeon: [name]. [name], MD;  Location: [facility] ORS;  Service: Gynecology;;   TUBAL LIGATION  1968   Patient Active Problem List   Diagnosis Date Noted   Pulmonary nodules 12/15/2021   Stroke ([place]) 12/14/2021   Port-A-Cath in place 08/13/2019   Goals of care, counseling/discussion 07/22/2019   Clawfoot, acquired, right 10/31/2016   Poor appetite    Neurogenic bladder    Adjustment disorder with mixed anxiety and depressed mood    Quadriplegia and quadriparesis (HCC)    Numbness and tingling    Paresthesias    UTI (lower urinary tract infection)    History of colon cancer    Acute blood loss anemia    Upper GI bleed    GBS (Guillain-Barre syndrome) (HCC)    Aspiration into airway    Endotracheally intubated    Acute respiratory failure ([place])    Shock ([place]) 10/09/2015   Hemoptysis    Transverse myelitis ([place])  Peripheral neuropathy 10/06/2015   Paresthesia of both hands 10/06/2015   Paresthesia of both feet 10/06/2015   Cerebral embolism with cerebral infarction 10/06/2015   Ataxia    Colon carcinoma metastatic to liver ([place]) 09/25/2015   Metastasis to liver ([place]) 08/28/2015   Colon cancer metastasized to liver [facility])    Liver lesion    Cerebral infarction due to unspecified mechanism 01/29/2015   B12 deficiency 01/29/2015   Essential hypertension 01/29/2015   Arm numbness left    Disorientation    Numbness and tingling of left side of face    TIA (transient ischemic attack) 01/11/2015   Confusion    Left arm numbness    DVT, lower extremity, distal ([place]) 10/27/2013   T4a, N0 09/27/2013   Iron deficiency anemia 09/04/2013   Symptomatic anemia 09/03/2013   Orthostasis 09/03/2013   Near syncope 09/03/2013    ONSET  DATE: 12/14/21   REFERRING DIAG: I63.9 (ICD-10-CM) - Acute CVA (cerebrovascular accident) ([place])   THERAPY DIAG:  No diagnosis found.  SUBJECTIVE:   SUBJECTIVE STATEMENT:  Pt denies pain , Pt accompanied by: daughter   PERTINENT HISTORY: presented to ED with acute onset of RLE weakness.  MRI showed small acute infarcts of L caudate tail and anterior L centrum semiovale, Wearing heart monitor for 1 month (until 01/22/22)  PMH also includes:  hx of Guillian Barre, hx of CVA  (L numbness 2016), hx of colon cancer s/p colectomy with hepatic metastasis, pulmonary nodules,  tinnitus of both ears, paripheral neuropathy  PRECAUTIONS: Fall, 24 hr supervision, active CA (lung--monitoring)  PAIN:  Are you having pain? No  OBJECTIVE:  TODAY'S TREATMENT:      Basic cooking task to retrieve items and cook a grilled cheese. Pt performed with supervision and min v.c ambulating with her cane. Therapist cued pt to avoid reaching into pan with fingertips as she reports sensory deficits. Pt required min v.c to hold onto countertop or cane for balance.  Pt  has a raised flower bed at home, therapist discussed with pt/ dtr use of the rollator as a seat for planing in raised flower bed. Pt simulated sitting on rollator seat and leaning forward to place screw pegs into pegboard  with RUE for increased fine motor coordiantion. Standing to remove the screw pegs from pegboard, with RUE for increased activity tolerance and fine motor coordination with RUE.  Arm bike x 6 min level  forward/backwards for conditioning without rest      HOME EXERCISE PROGRAM: 01/03/22:  Coordination HEP    GOALS: Potential Goals reviewed with patient? Yes  SHORT TERM GOALS: Target date: 01/18/2022  Pt will be independent with initial HEP. Goal status: MET  2.  Pt will perform BADLs mod I except shower transfer. Goal status:  MET 01/18/22  3.  Pt will perform simple home maintenance task mod I.  Goal status:  ONGOING   01/18/22:  pt reports that she has washed some dishes, but not performing other tasks., pt cooked grilled cheese with supervision and min v.c  4.  Pt will improve R hand coordination for ADLs as shown improving  completing 9-hole peg test by at least 3sec. Baseline:  26.75sec Goal status: ongoing Rt 25.29s    LONG TERM GOALS: Target date: 02/15/2022  Pt will be independent with updated HEP. Goal status:  ongoing  2.  Pt will perform simple cooking task mod I. Goal status: ongoing , supervision-min v.c on   3.  Pt will perform shower transfer  mod I. Goal status:  ongoing min A  4.  Pt will be able to plant flowers in pot mod I. Goal status: ongoing  5.  Pt will be able to rake with supervision. Goal status: ongoing    ASSESSMENT:  CLINICAL IMPRESSION: Pt demonstrates improving balance during functional activity. She used a cane during cooking task today without LOB. PERFORMANCE DEFICITS in functional skills including ADLs, IADLs, coordination, dexterity, FMC, GMC, mobility, balance, endurance, decreased knowledge of use of DME, and UE functional use, cognitive skills including memory.  IMPAIRMENTS are limiting patient from ADLs, IADLs, and leisure.   COMORBIDITIES  hx of CA (cervical, colon, liver cyst), hx of Guillain-Barre Syndrome (09/2015), HTN, hx of CVA that affects occupational performance. Patient will benefit from skilled OT to address above impairments and improve overall function.  MODIFICATION OR ASSISTANCE TO COMPLETE EVALUATION: Min-Moderate modification of tasks or assist with assess necessary to complete an evaluation.  OT OCCUPATIONAL PROFILE AND HISTORY: Detailed assessment: Review of records and additional review of physical, cognitive, psychosocial history related to current functional performance.  CLINICAL DECISION MAKING: Moderate - several treatment options, min-mod task modification necessary  REHAB POTENTIAL: Good  EVALUATION COMPLEXITY:  Moderate    PLAN: OT FREQUENCY: 2x/week  OT DURATION: 8 weeks +eval  PLANNED INTERVENTIONS: self care/ADL training, therapeutic exercise, therapeutic activity, neuromuscular re-education, manual therapy, passive range of motion, balance training, functional mobility training, patient/family education, cognitive remediation/compensation, energy conservation, and DME and/or AE instructions  RECOMMENDED OTHER SERVICES: scheduled for PT eval Thurs.  CONSULTED AND AGREED WITH PLAN OF CARE: Patient and family member/caregiver  PLAN FOR NEXT SESSION:  continue with standing balance for functional tasks,  sweeping/raking,    Cynthia [name], OT 02/03/2022, 12:03 PM  [facility] [address] [place], [place]  [address] [phone] phone [phone] 02/03/22 12:03 PM

## 2022-02-07 ENCOUNTER — Encounter: Payer: Self-pay | Admitting: Occupational Therapy

## 2022-02-07 ENCOUNTER — Ambulatory Visit: Payer: Medicare PPO | Admitting: Occupational Therapy

## 2022-02-07 ENCOUNTER — Ambulatory Visit: Payer: Medicare PPO | Admitting: Physical Therapy

## 2022-02-07 DIAGNOSIS — M6281 Muscle weakness (generalized): Secondary | ICD-10-CM

## 2022-02-07 DIAGNOSIS — R2681 Unsteadiness on feet: Secondary | ICD-10-CM

## 2022-02-07 DIAGNOSIS — I69351 Hemiplegia and hemiparesis following cerebral infarction affecting right dominant side: Secondary | ICD-10-CM

## 2022-02-07 DIAGNOSIS — R2689 Other abnormalities of gait and mobility: Secondary | ICD-10-CM | POA: Diagnosis not present

## 2022-02-07 DIAGNOSIS — R278 Other lack of coordination: Secondary | ICD-10-CM | POA: Diagnosis not present

## 2022-02-10 ENCOUNTER — Ambulatory Visit: Payer: Medicare PPO | Admitting: Physical Therapy

## 2022-02-10 ENCOUNTER — Ambulatory Visit: Payer: Medicare PPO | Admitting: Occupational Therapy

## 2022-02-10 DIAGNOSIS — M6281 Muscle weakness (generalized): Secondary | ICD-10-CM

## 2022-02-10 DIAGNOSIS — I69351 Hemiplegia and hemiparesis following cerebral infarction affecting right dominant side: Secondary | ICD-10-CM | POA: Diagnosis not present

## 2022-02-10 DIAGNOSIS — R2681 Unsteadiness on feet: Secondary | ICD-10-CM | POA: Diagnosis not present

## 2022-02-10 DIAGNOSIS — R2689 Other abnormalities of gait and mobility: Secondary | ICD-10-CM

## 2022-02-10 DIAGNOSIS — R278 Other lack of coordination: Secondary | ICD-10-CM | POA: Diagnosis not present

## 2022-02-10 NOTE — Therapy (Signed)
OUTPATIENT OCCUPATIONAL THERAPY NEURO TREATMENT  Patient Name: Cynthia [name] MRN: [id number] DOB:Jan 12, 1936, 86 y.o., female Today's Date: 02/10/2022  PCP: Dr. Jonathon Jordan REFERRING PROVIDER: Domenic Polite, MD; will follow up with  Dr. Metta Clines (neurology)     OT End of Session - 02/10/22 1343     Visit Number 13    Number of Visits 17    Date for OT Re-Evaluation 02/19/22    Authorization Type [employer]; prior auth required    Authorization - Visit Number 13    Authorization - Number of Visits 20    Progress Note Due on Visit 20    OT Start Time 1104    OT Stop Time 1145    OT Time Calculation (min) 41 min    Activity Tolerance Patient tolerated treatment well    Behavior During Therapy WFL for tasks assessed/performed             OCCUPATIONAL THERAPY DISCHARGE SUMMARY    Current functional level related to goals / functional outcomes: Pt good overall progress. She id not fully meet several goals as she requires supervision for safety with balance.    Remaining deficits: Decreased balance, decreased coordination, decreased strength   Education / Equipment: Pt  was instructed in safety for ADLs/ IADLs, and HEP. Pt/ dtr verbalize understanding.  Patient agrees to discharge. Patient goals were partially met. Patient is being discharged due to the patient's request..            Past Medical History:  Diagnosis Date   Anemia    Blood transfusion without reported diagnosis jan 2015   C. difficile colitis 03/05/14, 03/22/14, 04/05/14   recurrent   Cancer ([place])    liver   Cervical cancer ([place]) 1972   Colon cancer ([place]) JAN 2015   Colon cancer ([place])    Complication of anesthesia    slow to awaken in past, DONE WELL RECENTLY   DVT (deep venous thrombosis) ([place]) 10/24/13   Right leg   Guillain-Barre ([place]) 09/2015   Heart murmur    Hypertension    no bp meds    Liver cyst    Stroke ([place]) 12/2014   NUMBNESS ON LEFT SIDE   Tinnitus of both  ears ALL THE TIME   Past Surgical History:  Procedure Laterality Date   ABDOMINAL HYSTERECTOMY     vaginal- partial   BACK SURGERY  1981   lower lumbar   ESOPHAGOGASTRODUODENOSCOPY N/A 10/14/2015   Procedure: ESOPHAGOGASTRODUODENOSCOPY (EGD);  Surgeon: Clarene Essex, MD;  Location: [facility] ENDOSCOPY;  Service: Endoscopy;  Laterality: N/A;  Bedside in ICU   IR CV LINE INJECTION  07/20/2020   IR GENERIC HISTORICAL  09/07/2016   IR RADIOLOGIST EVAL & MGMT 09/07/2016 Aletta Edouard, MD [facility] INTERV RAD   IR IMAGING GUIDED PORT INSERTION  07/29/2019   LAPAROSCOPIC RIGHT COLECTOMY Right 10/22/2013   Procedure: LAPAROSCOPIC RIGHT COLECTOMY ;  Surgeon: [name], MD;  [facility]S;  Service: General;  Laterality: Right;   SALPINGOOPHORECTOMY  10/22/2013   Procedure: Lesly Dukes OOPHORECTOMY;  Surgeon: [name]. [name], MD;  Location[facility]S;  Service: Gynecology;;   TUBAL LIGATION  1968   Patient Active Problem List   Diagnosis Date Noted   Pulmonary nodules 12/15/2021   Stroke ([place]) 12/14/2021   Port-A-Cath in place 08/13/2019   Goals of care, counseling/discussion 07/22/2019   Clawfoot, acquired, right 10/31/2016   Poor appetite    Neurogenic bladder    Adjustment disorder with mixed  anxiety and depressed mood    Quadriplegia and quadriparesis (HCC)    Numbness and tingling    Paresthesias    UTI (lower urinary tract infection)    History of colon cancer    Acute blood loss anemia    Upper GI bleed    GBS (Guillain-Barre syndrome) (HCC)    Aspiration into airway    Endotracheally intubated    Acute respiratory failure ([place])    Shock ([place]) 10/09/2015   Hemoptysis    Transverse myelitis (HCC)    Peripheral neuropathy 10/06/2015   Paresthesia of both hands 10/06/2015   Paresthesia of both feet 10/06/2015   Cerebral embolism with cerebral infarction 10/06/2015   Ataxia    Colon carcinoma metastatic to liver ([place]) 09/25/2015   Metastasis to liver ([place]) 08/28/2015   Colon  cancer metastasized to liver [facility])    Liver lesion    Cerebral infarction due to unspecified mechanism 01/29/2015   B12 deficiency 01/29/2015   Essential hypertension 01/29/2015   Arm numbness left    Disorientation    Numbness and tingling of left side of face    TIA (transient ischemic attack) 01/11/2015   Confusion    Left arm numbness    DVT, lower extremity, distal ([place]) 10/27/2013   T4a, N0 09/27/2013   Iron deficiency anemia 09/04/2013   Symptomatic anemia 09/03/2013   Orthostasis 09/03/2013   Near syncope 09/03/2013    ONSET DATE: 12/14/21   REFERRING DIAG: I63.9 (ICD-10-CM) - Acute CVA (cerebrovascular accident) ([place])   THERAPY DIAG:  Unsteadiness on feet  Other lack of coordination  Muscle weakness (generalized)  Hemiplegia and hemiparesis following cerebral infarction affecting right dominant side (HCC)  Other abnormalities of gait and mobility  SUBJECTIVE:   SUBJECTIVE STATEMENT: Pt requests d/c today  Pt accompanied by: daughter   PERTINENT HISTORY: presented to ED with acute onset of RLE weakness.  MRI showed small acute infarcts of L caudate tail and anterior L centrum semiovale, Wearing heart monitor for 1 month (until 01/22/22)  PMH also includes:  hx of Guillian Barre, hx of CVA  (L numbness 2016), hx of colon cancer s/p colectomy with hepatic metastasis, pulmonary nodules,  tinnitus of both ears, paripheral neuropathy  PRECAUTIONS: Fall, 24 hr supervision, active CA (lung--monitoring)  PAIN:  Are you having pain? No  OBJECTIVE:  TODAY'S TREATMENT:                          Patient feels ready for d/c today. Therapist checked progress towards goals and discussed with patient.  Placing grooved pegs in pegboard with R hand with in-hand manipulation with minimal difficulty.                          Simulated raking with broom with supervision, no LOB without device, min v.c for foot positioning (Recommend supervision inially at home for  safety)    Simulated stepping in/out of tub/ shower  with supervision, holding onto wall for support, min v.c/ no LOB. Therapist recommends continued supervision at home for safety. Arm bike x 5 min level, forward/backwards for conditioning without rest     HOME EXERCISE PROGRAM: 01/03/22:  Coordination HEP    GOALS: Potential Goals reviewed with patient? Yes  SHORT TERM GOALS: Target date: 01/18/2022  Pt will be independent with initial HEP. Goal status: MET  2.  Pt will perform BADLs mod I except shower transfer. Goal status:  MET 01/18/22  3.  Pt will perform simple home maintenance task mod I.  Goal status: Marland Kitchen  MET.  02/07/22:  dusted, folding clothes  4.  Pt will improve R hand coordination for ADLs as shown improving  completing 9-hole peg test by at least 3sec. Baseline:  26.75sec Goal status: not met Rt 25.29s, 27.22    LONG TERM GOALS: Target date: 02/15/2022  Pt will be independent with updated HEP. Goal status:  met  2.  Pt will perform simple cooking task mod I. Goal status:not met, supervision  3.  Pt will perform shower transfer mod I. Goal status:  not met supervision, therapist simulated in clinic  4.  Pt will be able to plant flowers in pot mod I. Goal status:partially met simulated but pt has not attempted at home, pt feels as though she can perform  5.  Pt will be able to rake with supervision. Goal status: met  (simulated in clinic)    ASSESSMENT:  CLINICAL IMPRESSION: Pt and dtr agree with plans for d/c. Pt made good overall progress.   PERFORMANCE DEFICITS in functional skills including ADLs, IADLs, coordination, dexterity, FMC, GMC, mobility, balance, endurance, decreased knowledge of use of DME, and UE functional use, cognitive skills including memory.  IMPAIRMENTS are limiting patient from ADLs, IADLs, and leisure.   COMORBIDITIES  hx of CA (cervical, colon, liver cyst), hx of Guillain-Barre Syndrome (09/2015), HTN, hx of CVA that affects  occupational performance. Patient will benefit from skilled OT to address above impairments and improve overall function.  MODIFICATION OR ASSISTANCE TO COMPLETE EVALUATION: Min-Moderate modification of tasks or assist with assess necessary to complete an evaluation.  OT OCCUPATIONAL PROFILE AND HISTORY: Detailed assessment: Review of records and additional review of physical, cognitive, psychosocial history related to current functional performance.  CLINICAL DECISION MAKING: Moderate - several treatment options, min-mod task modification necessary  REHAB POTENTIAL: Good  EVALUATION COMPLEXITY: Moderate    PLAN: OT FREQUENCY: 2x/week  OT DURATION: 8 weeks +eval  PLANNED INTERVENTIONS: self care/ADL training, therapeutic exercise, therapeutic activity, neuromuscular re-education, manual therapy, passive range of motion, balance training, functional mobility training, patient/family education, cognitive remediation/compensation, energy conservation, and DME and/or AE instructions  RECOMMENDED OTHER SERVICES: scheduled for PT eval Thurs.  CONSULTED AND AGREED WITH PLAN OF CARE: Patient and family member/caregiver  PLAN FOR NEXT SESSION:  d/c OT   Danette Weinfeld, OT 02/10/2022, 1:44 PM  [facility] [address] [place], [place]  [address] [phone] phone [phone] 02/10/22 1:44 PM

## 2022-02-10 NOTE — Patient Instructions (Signed)
SCAPULA: Retraction    Hold cane with both hands. Pinch shoulder blades together. Do not shrug shoulders. Hold _5__ seconds. Use_no weight on cane. __10_ reps per set, _1-2_ sets per day, _7__ days per week  Copyright  [employer]. All rights reserved.  SHOULDER: Flexion - Sitting    Hold cane with both hands. Raise arms up. Keep elbows straight. Hold __5_ seconds. Use no weight on cane. __10_ reps per set, _1__ sets per day, __7_ days per week  Copyright  VHI. All rights reserved.

## 2022-02-10 NOTE — Therapy (Signed)
OUTPATIENT PHYSICAL THERAPY NEURO TREATMENT   Patient Name: Cynthia [name] MRN: [id number] DOB:[birth year], 86 y.o., female Today's Date: 02/10/2022  PCP: Jonathon Jordan, MD REFERRING PROVIDER: Domenic Polite, MD       PT End of Session - 02/10/22 1022     Visit Number 12    Number of Visits 17    Date for PT Re-Evaluation 02/14/22   [employer] Auth   Authorization Type [employer] Medicare    Authorization Time Period 16 PT visits approved 12/20/21 - 02/14/22    Progress Note Due on Visit 10    PT Start Time 1020    PT Stop Time 1100    PT Time Calculation (min) 40 min    Equipment Utilized During Treatment --    Activity Tolerance Patient tolerated treatment well    Behavior During Therapy [facility] for tasks assessed/performed                   Past Medical History:  Diagnosis Date   Anemia    Blood transfusion without reported diagnosis jan 2015   C. difficile colitis 03/05/14, 03/22/14, 04/05/14   recurrent   Cancer ([place])    liver   Cervical cancer ([place]) 1972   Colon cancer ([place]) JAN 2015   Colon cancer ([place])    Complication of anesthesia    slow to awaken in past, DONE WELL RECENTLY   DVT (deep venous thrombosis) ([place]) 10/24/13   Right leg   Guillain-Barre ([place]) 09/2015   Heart murmur    Hypertension    no bp meds    Liver cyst    Stroke ([place]) 12/2014   NUMBNESS ON LEFT SIDE   Tinnitus of both ears ALL THE TIME   Past Surgical History:  Procedure Laterality Date   ABDOMINAL HYSTERECTOMY     vaginal- partial   BACK SURGERY  1981   lower lumbar   ESOPHAGOGASTRODUODENOSCOPY N/A 10/14/2015   Procedure: ESOPHAGOGASTRODUODENOSCOPY (EGD);  Surgeon: Clarene Essex, MD;  Location[facility] ENDOSCOPY;  Service: Endoscopy;  Laterality: N/A;  Bedside in ICU   IR CV LINE INJECTION  07/20/2020   IR GENERIC HISTORICAL  09/07/2016   IR RADIOLOGIST EVAL & MGMT 09/07/2016 Aletta Edouard, MD [facility] INTERV RAD   IR IMAGING GUIDED PORT INSERTION  07/29/2019   LAPAROSCOPIC RIGHT  COLECTOMY Right 10/22/2013   Procedure: LAPAROSCOPIC RIGHT COLECTOMY ;  Surgeon: [name], MD;  Location: [facility] ORS;  Service: General;  Laterality: Right;   SALPINGOOPHORECTOMY  10/22/2013   Procedure: Lesly Dukes OOPHORECTOMY;  Surgeon: [name]. [name], MD;  Location: [facility] ORS;  Service: Gynecology;;   TUBAL LIGATION  1968   Patient Active Problem List   Diagnosis Date Noted   Pulmonary nodules 12/15/2021   Stroke ([place]) 12/14/2021   Port-A-Cath in place 08/13/2019   Goals of care, counseling/discussion 07/22/2019   Clawfoot, acquired, right 10/31/2016   Poor appetite    Neurogenic bladder    Adjustment disorder with mixed anxiety and depressed mood    Quadriplegia and quadriparesis (HCC)    Numbness and tingling    Paresthesias    UTI (lower urinary tract infection)    History of colon cancer    Acute blood loss anemia    Upper GI bleed    GBS (Guillain-Barre syndrome) (HCC)    Aspiration into airway    Endotracheally intubated    Acute respiratory failure ([place])    Shock ([place]) 10/09/2015   Hemoptysis    Transverse myelitis ([place])  Peripheral neuropathy 10/06/2015   Paresthesia of both hands 10/06/2015   Paresthesia of both feet 10/06/2015   Cerebral embolism with cerebral infarction 10/06/2015   Ataxia    Colon carcinoma metastatic to liver ([place]) 09/25/2015   Metastasis to liver ([place]) 08/28/2015   Colon cancer metastasized to liver [facility])    Liver lesion    Cerebral infarction due to unspecified mechanism 01/29/2015   B12 deficiency 01/29/2015   Essential hypertension 01/29/2015   Arm numbness left    Disorientation    Numbness and tingling of left side of face    TIA (transient ischemic attack) 01/11/2015   Confusion    Left arm numbness    DVT, lower extremity, distal ([place]) 10/27/2013   T4a, N0 09/27/2013   Iron deficiency anemia 09/04/2013   Symptomatic anemia 09/03/2013   Orthostasis 09/03/2013   Near syncope 09/03/2013    ONSET DATE: 12/15/2021  (referral)  REFERRING DIAG: I63.9 (ICD-10-CM) - Acute CVA (cerebrovascular accident) ([place])   THERAPY DIAG:  Unsteadiness on feet  Other lack of coordination  Muscle weakness (generalized)  SUBJECTIVE: Pt has not been walking much, states she "does not have a timer" to perform intervals. No new changes                                                                                                                                                                                             PAIN:  Are you having pain? No    PRECAUTIONS: Fall  OBJECTIVE:   TODAY'S TREATMENT:  Self-care/home management  Discussed POC moving forward and pt reported she is ready to DC on Monday due to feeling as though she can "do every thing at home". Therapist in agreement to DC Monday w/strong encouragement that pt increase compliance to walking program/HEP for continued functional gains. Will plan to DC Monday.   Ther Act    LTG assessment   [facility] PT Assessment - 02/10/22 1035       Berg Balance Test   Sit to Stand Able to stand without using hands and stabilize independently    Standing Unsupported Able to stand safely 2 minutes    Sitting with Back Unsupported but Feet Supported on Floor or Stool Able to sit safely and securely 2 minutes    Stand to Sit Sits safely with minimal use of hands    Transfers Able to transfer safely, minor use of hands    Standing Unsupported with Eyes Closed Able to stand 10 seconds safely    Standing Unsupported with Feet Together Able to place feet together independently and stand 1 minute safely  From Standing, Reach Forward with Outstretched Arm Can reach confidently >25 cm (10")    From Standing Position, Pick up Object from [place] to pick up shoe safely and easily    From Standing Position, Turn to Look Behind Over each Shoulder Looks behind from both sides and weight shifts well    Turn 360 Degrees Able to turn 360 degrees safely but slowly   5.72s to R,  6s to L   Standing Unsupported, Alternately Place Feet on Step/Stool Able to stand independently and safely and complete 8 steps in 20 seconds   14.97s   Standing Unsupported, One Foot in Front Able to plae foot ahead of the other independently and hold 30 seconds    Standing on One Leg Tries to lift leg/unable to hold 3 seconds but remains standing independently    Total Score 50    Berg comment: moderate fall risk              NMR   RAMP:  Level of Assistance: SBA Assistive device utilized: Single point cane Ramp Comments: Pt demonstrated good technique to ascend ramp but difficulty w/descent due to quad weakness. Encouraged pt to practice walking up/down ramp at home w/SPC for improved eccentric quad strength, pt verbalized understanding   CURB:  Level of Assistance: CGA and Min A Assistive device utilized: Single point cane Curb Comments: Pt practiced ascending/descending curb x2 w/SPC. On first descent, pt required min A to prevent fall as pt's bilateral knees buckled. On second descent, pt required CGA only due to improved quad engagement.    PATIENT EDUCATION: Education details: Re-iterated:  Modifying walking program to intervals: walk 4 laps around tree at church, sit down and read 3-4 pages of book and repeat x5. Plan to DC from PT next week Person educated: Patient and Child(ren) Education method: Explanation, Demonstration, and Verbal cues Education comprehension: verbalized understanding and needs further education   HOME EXERCISE PROGRAM: Access Code: [email or web address] URL: [email or web address] Date: 01/06/2022 Prepared by: Mickie Bail Millena Callins  Exercises - Sit to Stand Without Arm Support  - 1 x daily - 7 x weekly - 3 sets - 10 reps - Standing March with Counter Support  - 1 x daily - 7 x weekly - 3 sets - 10 reps - Standing Hip Abduction with Resistance at Ankles and Counter Support  - 1 x daily - 7 x weekly - 3 sets - 10 reps - Standing Hip Extension with  Resistance at Ankles and Counter Support  - 1 x daily - 7 x weekly - 3 sets - 10 reps - Side Stepping with red Resistance band at Thighs and Counter Support  - 1 x daily - 7 x weekly - 3 sets - 10 reps - Forward Backward Monster Walk with red Band at Thighs and Counter Support  - 1 x daily - 7 x weekly - 3 sets - 10 reps  GOALS: Goals reviewed with patient? Yes  SHORT TERM GOALS: Target date: 01/20/2022     Pt will be independent with initial HEP for improved strength, balance, transfers and gait.  Baseline: initiated on eval Goal status: IN PROGRESS  2.  Pt will improve gait velocity to at least 2.0 ft/s with LRAD for improved gait efficiency   Baseline: 1.64 ft/s w/RW; 1.88 ft/s with rollator  Goal status: NOT MET  3.  Pt will improve Berg score to 42/56 for decreased fall risk  Baseline: 37/56; 46/56 Goal status: MET  4.  Pt will improve  5 x STS to less than or equal to 20 seconds without UE support to demonstrate improved functional strength and transfer efficiency.   Baseline: 24.34s w/BUE support; 18.22s without UE support  Goal status: MET  5.  Pt will ambulate 250' on unlevel surface w/S* and LRAD for improved functional mobility and imitation of home environment  Baseline:  Goal status: MET   LONG TERM GOALS: Target date: 02/17/2022     Pt will be independent with final HEP for improved strength, balance, transfers and gait.  Baseline:  Goal status: INITIAL  2. Pt will improve Berg score to 47/56 for decreased fall risk  Baseline: 37/56; 50/56  Goal status: MET  3. Pt will improve gait velocity to at least 2.2 ft/s with LRAD for improved gait efficiency   Baseline: 1.64 ft/s w/RW; 1.88 ft/s with rollator on 6/6 Goal status: REVISED  4.  Pt will improve 5 x STS to less than or equal to 15 seconds without UE support to demonstrate improved functional strength and transfer efficiency.   Baseline: 24.34s w/BUE support Goal status: INITIAL  5.  Pt will ambulate  >500' on level/unlevel surface w/LRAD mod I for improved independence with functional mobility  Baseline:  Goal status: INITIAL  ASSESSMENT:  CLINICAL IMPRESSION: Emphasis of skilled PT session on beginning LTG assessment and practicing curb/ramp negotiation w/SPC. Pt has met 1/5 LTGs so far, achieving a 50/56 on Berg. Pt continues to require CGA-min A for curb/ramp negotiation w/SPC due to quad weakness. Pt in agreement to DC from PT next session to continue w/exercise and walking program at home. Continue POC.    OBJECTIVE IMPAIRMENTS Abnormal gait, decreased activity tolerance, decreased balance, decreased endurance, decreased mobility, decreased strength, and impaired sensation.   ACTIVITY LIMITATIONS community activity, driving, meal prep, laundry, medication management, and yard work.   PERSONAL FACTORS Age, Past/current experiences, Time since onset of injury/illness/exacerbation, and 1 comorbidity: hx of Guillain-Barre  are also affecting patient's functional outcome.    REHAB POTENTIAL: Good  CLINICAL DECISION MAKING: Stable/uncomplicated  EVALUATION COMPLEXITY: Low  PLAN: PT FREQUENCY: 2x/week  PT DURATION: 12 weeks  PLANNED INTERVENTIONS: Therapeutic exercises, Therapeutic activity, Neuromuscular re-education, Balance training, Gait training, Patient/Family education, Stair training, DME instructions, and Aquatic Therapy  PLAN FOR NEXT SESSION: goal assessment and DC  Eldrick Penick E Vana Arif, PT, DPT 02/10/22, 12:30 PM

## 2022-02-14 ENCOUNTER — Encounter: Payer: Medicare PPO | Admitting: Occupational Therapy

## 2022-02-14 ENCOUNTER — Ambulatory Visit: Payer: Medicare PPO | Attending: Internal Medicine | Admitting: Physical Therapy

## 2022-02-14 DIAGNOSIS — M6281 Muscle weakness (generalized): Secondary | ICD-10-CM

## 2022-02-14 DIAGNOSIS — R2681 Unsteadiness on feet: Secondary | ICD-10-CM | POA: Diagnosis not present

## 2022-02-14 DIAGNOSIS — R2689 Other abnormalities of gait and mobility: Secondary | ICD-10-CM | POA: Diagnosis not present

## 2022-02-14 NOTE — Therapy (Signed)
OUTPATIENT PHYSICAL THERAPY NEURO TREATMENT- DISCHARGE SUMMARY   Patient Name: Cynthia [name] MRN: [id number] DOB:1936-01-21, 86 y.o., female Today's Date: 02/14/2022  PCP: Jonathon Jordan, MD REFERRING PROVIDER: Domenic Polite, MD   PHYSICAL THERAPY DISCHARGE SUMMARY  Visits from Start of Care: 13  Current functional level related to goals / functional outcomes: See below    Remaining deficits: Decreased endurance, decreased strength    Education / Equipment: HEP, walking program    Patient agrees to discharge. Patient goals were met. Patient is being discharged due to being pleased with the current functional level.       PT End of Session - 02/14/22 1015     Visit Number 13    Number of Visits 17    Date for PT Re-Evaluation 02/14/22   [employer] Auth   Authorization Type [employer] Medicare    Authorization Time Period 7 PT visits approved 12/20/21 - 02/14/22    Progress Note Due on Visit 10    PT Start Time 2263    PT Stop Time 1053   DC   PT Time Calculation (min) 38 min    Activity Tolerance Patient tolerated treatment well    Behavior During Therapy WFL for tasks assessed/performed                    Past Medical History:  Diagnosis Date   Anemia    Blood transfusion without reported diagnosis jan 2015   C. difficile colitis 03/05/14, 03/22/14, 04/05/14   recurrent   Cancer ([place])    liver   Cervical cancer ([place]) 1972   Colon cancer ([place]) JAN 2015   Colon cancer ([place])    Complication of anesthesia    slow to awaken in past, DONE WELL RECENTLY   DVT (deep venous thrombosis) (HCC) 10/24/13   Right leg   Guillain-Barre ([place]) 09/2015   Heart murmur    Hypertension    no bp meds    Liver cyst    Stroke ([place]) 12/2014   NUMBNESS ON LEFT SIDE   Tinnitus of both ears ALL THE TIME   Past Surgical History:  Procedure Laterality Date   ABDOMINAL HYSTERECTOMY     vaginal- partial   BACK SURGERY  1981   lower lumbar   ESOPHAGOGASTRODUODENOSCOPY  N/A 10/14/2015   Procedure: ESOPHAGOGASTRODUODENOSCOPY (EGD);  Surgeon: Clarene Essex, MD;  Location[facility] ENDOSCOPY;  Service: Endoscopy;  Laterality: N/A;  Bedside in ICU   IR CV LINE INJECTION  07/20/2020   IR GENERIC HISTORICAL  09/07/2016   IR RADIOLOGIST EVAL & MGMT 09/07/2016 Aletta Edouard, MD [facility] INTERV RAD   IR IMAGING GUIDED PORT INSERTION  07/29/2019   LAPAROSCOPIC RIGHT COLECTOMY Right 10/22/2013   Procedure: LAPAROSCOPIC RIGHT COLECTOMY ;  Surgeon: [name], MD;  [facility]S;  Service: General;  Laterality: Right;   SALPINGOOPHORECTOMY  10/22/2013   Procedure: Lesly Dukes OOPHORECTOMY;  Surgeon: [name]. [name], MD;  Location: [facility]S;  Service: Gynecology;;   TUBAL LIGATION  1968   Patient Active Problem List   Diagnosis Date Noted   Pulmonary nodules 12/15/2021   Stroke ([place]) 12/14/2021   Port-A-Cath in place 08/13/2019   Goals of care, counseling/discussion 07/22/2019   Clawfoot, acquired, right 10/31/2016   Poor appetite    Neurogenic bladder    Adjustment disorder with mixed anxiety and depressed mood    Quadriplegia and quadriparesis (HCC)    Numbness and tingling    Paresthesias    UTI (lower urinary tract  infection)    History of colon cancer    Acute blood loss anemia    Upper GI bleed    GBS (Guillain-Barre syndrome) (HCC)    Aspiration into airway    Endotracheally intubated    Acute respiratory failure (HCC)    Shock ([place]) 10/09/2015   Hemoptysis    Transverse myelitis (HCC)    Peripheral neuropathy 10/06/2015   Paresthesia of both hands 10/06/2015   Paresthesia of both feet 10/06/2015   Cerebral embolism with cerebral infarction 10/06/2015   Ataxia    Colon carcinoma metastatic to liver ([place]) 09/25/2015   Metastasis to liver ([place]) 08/28/2015   Colon cancer metastasized to liver [facility])    Liver lesion    Cerebral infarction due to unspecified mechanism 01/29/2015   B12 deficiency 01/29/2015   Essential hypertension 01/29/2015   Arm  numbness left    Disorientation    Numbness and tingling of left side of face    TIA (transient ischemic attack) 01/11/2015   Confusion    Left arm numbness    DVT, lower extremity, distal ([place]) 10/27/2013   T4a, N0 09/27/2013   Iron deficiency anemia 09/04/2013   Symptomatic anemia 09/03/2013   Orthostasis 09/03/2013   Near syncope 09/03/2013    ONSET DATE: 12/15/2021 (referral)  REFERRING DIAG: I63.9 (ICD-10-CM) - Acute CVA (cerebrovascular accident) ([place])   THERAPY DIAG:  Unsteadiness on feet  Muscle weakness (generalized)  Other abnormalities of gait and mobility  SUBJECTIVE: Pt has not been walking much outside due to heat. Has been doing more at home (ironed her clothes, repotted some plants). Excited to DC today.                                                                                                                                                                                             PAIN:  Are you having pain? No    PRECAUTIONS: Fall  OBJECTIVE:   TODAY'S TREATMENT:  Ther Act    LTG assessment   [facility] PT Assessment - 02/14/22 1020       Transfers   Five time sit to stand comments  19.72s without UE support      Ambulation/Gait   Gait velocity 32.8' over 14.37s = 2.28 ft/s w/rollator            Gait pattern: step through pattern, decreased arm swing- Left, decreased stride length, scissoring, and narrow BOS Distance walked: >500' outside on sidewalk  Assistive device utilized: Single point cane Level of assistance: SBA Comments: Lateral gait deviations to R side requiring min cues to avoid tripping off curb on R side,  increased as pt fatigued. Noted scissoring of RLE, min cues for "feet apart".   PATIENT EDUCATION: Education details: Re-iterated:  Modifying walking program to intervals: walk 4 laps around tree at church, sit down and read 3-4 pages of book and repeat x5. How to obtain new PT referral if mobility needs change  Person  educated: Patient and Child(ren) Education method: Explanation, Demonstration, and Verbal cues Education comprehension: verbalized understanding   HOME EXERCISE PROGRAM: Access Code: [email or web address] URL: [email or web address] Date: 01/06/2022 Prepared by: Mickie Bail Kemauri Musa  Exercises - Sit to Stand Without Arm Support  - 1 x daily - 7 x weekly - 3 sets - 10 reps - Standing March with Counter Support  - 1 x daily - 7 x weekly - 3 sets - 10 reps - Standing Hip Abduction with Resistance at Ankles and Counter Support  - 1 x daily - 7 x weekly - 3 sets - 10 reps - Standing Hip Extension with Resistance at Ankles and Counter Support  - 1 x daily - 7 x weekly - 3 sets - 10 reps - Side Stepping with red Resistance band at Thighs and Counter Support  - 1 x daily - 7 x weekly - 3 sets - 10 reps - Forward Backward Monster Walk with red Band at Thighs and Counter Support  - 1 x daily - 7 x weekly - 3 sets - 10 reps  GOALS: Goals reviewed with patient? Yes  SHORT TERM GOALS: Target date: 01/20/2022     Pt will be independent with initial HEP for improved strength, balance, transfers and gait.  Baseline: initiated on eval Goal status: IN PROGRESS  2.  Pt will improve gait velocity to at least 2.0 ft/s with LRAD for improved gait efficiency   Baseline: 1.64 ft/s w/RW; 1.88 ft/s with rollator  Goal status: NOT MET  3.  Pt will improve Berg score to 42/56 for decreased fall risk  Baseline: 37/56; 46/56 Goal status: MET  4.  Pt will improve 5 x STS to less than or equal to 20 seconds without UE support to demonstrate improved functional strength and transfer efficiency.   Baseline: 24.34s w/BUE support; 18.22s without UE support  Goal status: MET  5.  Pt will ambulate 250' on unlevel surface w/S* and LRAD for improved functional mobility and imitation of home environment  Baseline:  Goal status: MET   LONG TERM GOALS: Target date: 02/17/2022     Pt will be independent with final HEP  for improved strength, balance, transfers and gait.  Baseline:  Goal status: MET  2. Pt will improve Berg score to 47/56 for decreased fall risk  Baseline: 37/56; 50/56  Goal status: MET  3. Pt will improve gait velocity to at least 2.2 ft/s with LRAD for improved gait efficiency   Baseline: 1.64 ft/s w/RW; 1.88 ft/s with rollator on 6/6; 2.28 ft/s w/rollator on 7/3 Goal status: MET  4.  Pt will improve 5 x STS to less than or equal to 15 seconds without UE support to demonstrate improved functional strength and transfer efficiency.   Baseline: 24.34s w/BUE support; 19.72 s without UE support  Goal status: NOT MET  5.  Pt will ambulate >500' on level/unlevel surface w/LRAD mod I for improved independence with functional mobility  Baseline: Pt requires S* w/SPC on unlevel surfaces due to RLE scissoring and lateral deviations  Goal status: NOT MET  ASSESSMENT:  CLINICAL IMPRESSION: Emphasis of skilled PT session on LTG assessment and DC from PT. Pt  has met 3/5 goals and partially met the remainder 2. Pt improved her 5x STS time without use of BUEs from baseline but missed her goal by 4s. Pt also able to walk >500' mod I w/rollator outside but requires S*w/SPC due to mild lateral deviations to R side. Pt in agreement to DC from PT today due to improved balance and strength and plans to continue her exercises at home for functional gains.    OBJECTIVE IMPAIRMENTS Abnormal gait, decreased activity tolerance, decreased balance, decreased endurance, decreased mobility, decreased strength, and impaired sensation.   ACTIVITY LIMITATIONS community activity, driving, meal prep, laundry, medication management, and yard work.   PERSONAL FACTORS Age, Past/current experiences, Time since onset of injury/illness/exacerbation, and 1 comorbidity: hx of Guillain-Barre  are also affecting patient's functional outcome.    REHAB POTENTIAL: Good  CLINICAL DECISION MAKING:  Stable/uncomplicated  EVALUATION COMPLEXITY: Low  PLAN: PT FREQUENCY: 2x/week  PT DURATION: 12 weeks  PLANNED INTERVENTIONS: Therapeutic exercises, Therapeutic activity, Neuromuscular re-education, Balance training, Gait training, Patient/Family education, Stair training, DME instructions, and Aquatic Therapy   Justyce Yeater E Rendell Thivierge, PT, DPT 02/14/22, 10:55 AM

## 2022-02-16 ENCOUNTER — Inpatient Hospital Stay: Payer: Medicare PPO | Admitting: Oncology

## 2022-02-16 ENCOUNTER — Inpatient Hospital Stay: Payer: Medicare PPO | Attending: Oncology

## 2022-02-16 ENCOUNTER — Inpatient Hospital Stay: Payer: Medicare PPO

## 2022-02-16 ENCOUNTER — Encounter: Payer: Self-pay | Admitting: Oncology

## 2022-02-16 VITALS — BP 132/73 | HR 62 | Temp 98.2°F | Ht 60.0 in | Wt 122.6 lb

## 2022-02-16 DIAGNOSIS — R531 Weakness: Secondary | ICD-10-CM | POA: Insufficient documentation

## 2022-02-16 DIAGNOSIS — C7800 Secondary malignant neoplasm of unspecified lung: Secondary | ICD-10-CM | POA: Diagnosis not present

## 2022-02-16 DIAGNOSIS — C787 Secondary malignant neoplasm of liver and intrahepatic bile duct: Secondary | ICD-10-CM

## 2022-02-16 DIAGNOSIS — Z8673 Personal history of transient ischemic attack (TIA), and cerebral infarction without residual deficits: Secondary | ICD-10-CM | POA: Insufficient documentation

## 2022-02-16 DIAGNOSIS — C189 Malignant neoplasm of colon, unspecified: Secondary | ICD-10-CM | POA: Diagnosis not present

## 2022-02-16 DIAGNOSIS — C180 Malignant neoplasm of cecum: Secondary | ICD-10-CM | POA: Insufficient documentation

## 2022-02-16 DIAGNOSIS — Z95828 Presence of other vascular implants and grafts: Secondary | ICD-10-CM

## 2022-02-16 LAB — CEA (ACCESS): CEA (CHCC): 13.93 ng/mL — ABNORMAL HIGH (ref 0.00–5.00)

## 2022-02-16 MED ORDER — SODIUM CHLORIDE 0.9% FLUSH
10.0000 mL | INTRAVENOUS | Status: DC | PRN
  Administered 2022-02-16: 10 mL

## 2022-02-16 MED ORDER — HEPARIN SOD (PORK) LOCK FLUSH 100 UNIT/ML IV SOLN
500.0000 [IU] | Freq: Once | INTRAVENOUS | Status: AC | PRN
  Administered 2022-02-16: 500 [IU]

## 2022-02-16 NOTE — Progress Notes (Signed)
[place] OFFICE PROGRESS NOTE   Diagnosis: Colon cancer  INTERVAL HISTORY:   Cynthia [name] returns as scheduled.  She feels well.  Good appetite.  No pain.  No change at the right chest wall.  Right-sided weakness is improving with physical therapy.  She had a CVA in May.  Objective:  Vital signs in last 24 hours:  Blood pressure 132/73, pulse 62, temperature 98.2 F (36.8 C), temperature source Tympanic, height 5' (1.524 m), weight 122 lb 9.6 oz (55.6 kg), SpO2 98 %.    Resp: Lungs clear bilaterally Cardio: Regular rate and rhythm GI: No hepatosplenomegaly, nontender, no mass Vascular: No leg edema Neuro: Alert and oriented Skin: Mild skin thickening and "puckering "at the mid right lateral chest wall, no discrete mass, nontender  Portacath/PICC-without erythema  Lab Results:  Lab Results  Component Value Date   WBC 5.8 12/14/2021   HGB 11.6 (L) 12/14/2021   HCT 37.4 12/14/2021   MCV 89.0 12/14/2021   PLT 257 12/14/2021   NEUTROABS 4.0 12/14/2021    CMP  Lab Results  Component Value Date   NA 142 12/14/2021   K 4.5 12/14/2021   CL 104 12/14/2021   CO2 29 12/14/2021   GLUCOSE 91 12/14/2021   BUN 17 12/14/2021   CREATININE 0.94 12/14/2021   CALCIUM 9.6 12/14/2021   PROT 7.2 12/14/2021   ALBUMIN 3.8 12/14/2021   AST 20 12/14/2021   ALT 9 12/14/2021   ALKPHOS 49 12/14/2021   BILITOT 0.4 12/14/2021   GFRNONAA >60 12/14/2021   GFRAA >60 10/08/2019    Lab Results  Component Value Date   CEA1 4.27 05/24/2021   CEA 7.53 (H) 11/17/2021     Medications: I have reviewed the patient's current medications.   Assessment/Plan: Moderately differentiated adenocarcinoma of the cecum, stage III (T4a, N1), status post a laparoscopic assisted right colectomy 10/22/2013 The tumor returned microsatellite stable with normal mismatch repair protein expression   Cycle 1 adjuvant Xeloda 12/25/2013.   Cycle 2 adjuvant Xeloda 01/15/2014.   Cycle 3  adjuvant Xeloda 02/05/2014.   Cycle 4 adjuvant Xeloda 03/18/2014, discontinued after 4 days secondary to diarrhea   Cycle 5 adjuvant Xeloda 04/16/2014, Xeloda dose reduced to 1000 mg in the a.m. and 500 mg in the p.m.   Cycle 6 adjuvant Xeloda 05/07/2014. Cycle 7 adjuvant Xeloda 05/28/2014. Cycle 8 adjuvant Xeloda 06/18/2014. Restaging CTs on 07/14/2014 with no evidence of metastatic colon cancer Colonoscopy 10/02/2014 with diverticulosis in the sigmoid colon and in the descending colon. One 4 mm polyp in the sigmoid colon with complete resection. Polyp tissue not retrieved. Internal hemorrhoids. Next colonoscopy in 3 years for surveillance. Restaging CTs 07/16/2015 with 2 new hypodense liver lesions concerning for metastases, no other evidence of disease progression PET scan 07/28/2015 with a single hypermetabolic right liver lesion, no other evidence of metastatic disease Ultrasound-guided biopsy of the right liver lesion 07/30/2015 confirmed metastatic colon cancer, MSS, tumor mutation burden 1, KRAS G12D, PIK3CA, PTEN MRI abdomen 08/20/2015 with similar to slight decrease in size of 2 hepatic metastasis. No new liver lesions or extrahepatic metastatic disease identified. Radiofrequency ablation of 2 liver lesions 09/25/2015 MRI abdomen 02/02/2016-coagulative necrosis at the 2 ablated liver tumor sites, with no enhancement to suggest residual or recurrent liver tumor. No new liver masses or other findings of metastatic disease in the abdomen. MRI abdomen 08/10/2016 revealed stable ablation sites, no evidence of progressive metastatic disease Restaging CT 02/08/2017-stable treated liver lesions, no evidence of progressive metastatic  disease, stable borderline enlarged right inguinal node  CEA with stable mild elevation 02/08/2017, 03/01/2017, 03/29/2017, 04/19/2017 CEA further elevated 08/21/2017 CTs 09/05/2017- possible local recurrence of tumor along the ablation site right hepatic lobe lesion.   Cephalad to the ablation site approximately 1.9 x 2.1 cm soft tissue mass progressive compared to the prior exam. Liver SBRT 10/17/2017, 10/19/2017, 10/23/2017, 10/25/2017, 10/27/2017 CT 02/19/2018- progressive ill-defined areas of low attenuation with enhancement surrounding the segment 6 ablation site, stable segment 8 ablation site enlargement of extracapsular lesion posteriorly, no new hepatic lesions, wall thickening/edema at the distal ileum CT 05/22/2018- 2 new lung nodules suspicious for metastases, stable segment 8 ablation zone, slight decrease in the size of delayed perfusion at the segment 6 ablation zone, posterior extracapsular lesion is slightly smaller CT chest 07/13/2018- multiple lung nodules consistent with metastatic disease Cycle 1 Xeloda 07/23/2018 Cycle 2 Xeloda 08/13/2018 Cycle 3 Xeloda 09/03/2018 Cycle 4 Xeloda 09/24/2018 Restaging chest CT 10/12/2018- scattered pulmonary nodules/metastases mildly improved. Cycle 5 Xeloda 10/15/2018 Cycle 6 Xeloda 11/05/2018 Cycle 7 Xeloda 11/26/2018 Cycle 8 Xeloda 12/17/2018 Cycle 9 Xeloda 01/07/2019 CT chest 01/24/2019- stable pulmonary nodules.  No new pulmonary nodules.  Stable left hepatic lobe cysts and right hepatic lobe lesions.  No new hepatic lesions.  Stable right ninth and 10th rib lesions. Xeloda daily dosing schedule beginning 01/28/2019 due to hand-foot syndrome CT 05/27/2019- enlargement of lung metastases, similar right hepatic lobe lesion CT 08/05/2019-enlargement of bilateral lung nodules, no new lesions, stable right hepatic lesion Cycle 1 FOLFIRI 08/13/2019 Cycle 2 FOLFIRI 09/04/2019, Irinotecan dose reduced due to neutropenia Cycle 3 FOLFIRI 09/17/2019 Cycle 4 FOLFIRI 10/08/2019, 5-FU and irinotecan dose reduced CT chest 10/28/2019-bilateral pulmonary metastases with minimal improvement.  Stable ablation zone in the posterior right hepatic lobe. CT chest 03/03/2020-enlargement of pulmonary metastases, decreased right hepatic ablation  site CT chest 07/07/2020-enlargement of pulmonary metastases, stable ablation site involving right hepatic lobe Cycle 1 FOLFIRI/Avastin 07/27/2020 Cycle 2 FOLFIRI/Avastin 08/18/2020 Treatment held 09/08/2020 due to neutropenia Cycle 3 FOLFIRI/Avastin 09/17/2020 (irinotecan dose reduced due to neutropenia) Cycle 4 FOLFIRI/Avastin 10/06/2020 Chemotherapy discontinued per patient request CT chest 11/16/2020-decrease in size of dominant right perihilar nodule, other lung nodules are stable, stable right liver ablation site Right ovary cystadenofibroma, status post a bilateral oophorectomy 10/22/2013 Remote history of cervical cancer. Iron deficiency anemia-resolved. Right calf deep vein thrombosis 10/24/2013-she completed 3 months of xarelto. Indeterminate 12 mm hypermetabolic right pelvic density on the staging PET scan 10/04/2013-CT followup recommended per the GI tumor conference 11/13/2013. No longer visualized on a CT 07/14/2014. Soft tissue implant overlying the posterior right liver on the CT 94/85/4627-OJJ hypermetabolic on the PET scan 00/93/8182. C. difficile colitis 03/05/2014. She completed a course of Flagyl, recurrent diarrhea beginning 03/21/2014, positive C. difficile toxin 03/26/2014-resumed Flagyl for planned 10 day course. Vancomycin beginning 04/08/2014. Taper complete 05/21/2014.   9.  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  Xeloda dose/schedule adjusted beginning 01/28/2019. 10.  CVA-left centrum semiovale and left caudate 12/14/2021      Disposition: Ms. Olmsted has a history of metastatic colon cancer.  She appears stable.  There is no clinical evidence for progression of the colon cancer.  The plan is to continue observation.  She will return for an office visit and Port-A-Cath flush in 8 weeks.  Cynthia Coder, MD  02/16/2022  8:44 AM

## 2022-02-17 ENCOUNTER — Ambulatory Visit: Payer: Medicare PPO | Admitting: Physical Therapy

## 2022-02-17 ENCOUNTER — Encounter: Payer: Medicare PPO | Admitting: Occupational Therapy

## 2022-02-21 ENCOUNTER — Encounter: Payer: Medicare PPO | Admitting: Occupational Therapy

## 2022-02-21 ENCOUNTER — Ambulatory Visit: Payer: Medicare PPO | Admitting: Physical Therapy

## 2022-02-24 ENCOUNTER — Encounter: Payer: Medicare PPO | Admitting: Occupational Therapy

## 2022-02-24 ENCOUNTER — Ambulatory Visit: Payer: Medicare PPO | Admitting: Physical Therapy

## 2022-03-02 ENCOUNTER — Ambulatory Visit: Payer: Medicare PPO | Admitting: Student

## 2022-03-07 ENCOUNTER — Other Ambulatory Visit: Payer: Self-pay

## 2022-03-20 NOTE — Progress Notes (Deleted)
Electrophysiology Office Note:    Date:  03/20/2022   ID:  Cynthia [name], DOB 04-09-1936, MRN [id number]  PCP:  Cynthia Jordan, MD  [facility] Cardiologist:  None  [facility] Electrophysiologist:  None   Referring MD: Cynthia Jordan, MD   Chief Complaint: stroke  History of Present Illness:    Cynthia [name] is a 86 y.o. female who presents for an evaluation of stroke at the request of Dr Tomi Likens. Their medical history includes HTN, DVT, colon CA, cervical CA. The patient last saw Dr Tomi Likens with neurology 01/17/2022. Her stroke was in May 2023 after she experienced acute RLE/RUE weakness.Imaging showed 2 separate areas of infarct suspicious for cardioembolic source.      Past Medical History:  Diagnosis Date   Anemia    Blood transfusion without reported diagnosis jan 2015   C. difficile colitis 03/05/14, 03/22/14, 04/05/14   recurrent   Cancer ([place])    liver   Cervical cancer ([place]) 1972   Colon cancer ([place]) JAN 2015   Colon cancer ([place])    Complication of anesthesia    slow to awaken in past, DONE WELL RECENTLY   DVT (deep venous thrombosis) ([place]) 10/24/13   Right leg   Guillain-Barre ([place]) 09/2015   Heart murmur    Hypertension    no bp meds    Liver cyst    Stroke ([place]) 12/2014   NUMBNESS ON LEFT SIDE   Tinnitus of both ears ALL THE TIME    Past Surgical History:  Procedure Laterality Date   ABDOMINAL HYSTERECTOMY     vaginal- partial   BACK SURGERY  1981   lower lumbar   ESOPHAGOGASTRODUODENOSCOPY N/A 10/14/2015   Procedure: ESOPHAGOGASTRODUODENOSCOPY (EGD);  Surgeon: Cynthia Essex, MD;  Location: [facility] ENDOSCOPY;  Service: Endoscopy;  Laterality: N/A;  Bedside in ICU   IR CV LINE INJECTION  07/20/2020   IR GENERIC HISTORICAL  09/07/2016   IR RADIOLOGIST EVAL & MGMT 09/07/2016 Cynthia Edouard, MD [facility] INTERV RAD   IR IMAGING GUIDED PORT INSERTION  07/29/2019   LAPAROSCOPIC RIGHT COLECTOMY Right 10/22/2013   Procedure: LAPAROSCOPIC RIGHT COLECTOMY ;   Surgeon: Cynthia Hector, MD;  Location: [facility] ORS;  Service: General;  Laterality: Right;   SALPINGOOPHORECTOMY  10/22/2013   Procedure: Cynthia [name];  Surgeon: Cynthia Lora. [name], MD;  Location: [facility] ORS;  Service: Gynecology;;   TUBAL LIGATION  1968    Current Medications: No outpatient medications have been marked as taking for the 03/21/22 encounter (Appointment) with Cynthia Epley, MD.     Allergies:   Morphine and related   Social History   Socioeconomic History   Marital status: Widowed    Spouse name: Not on file   Number of children: Not on file   Years of education: Not on file   Highest education level: Not on file  Occupational History   Not on file  Tobacco Use   Smoking status: Never   Smokeless tobacco: Never  Substance and Sexual Activity   Alcohol use: No    Alcohol/week: 0.0 standard drinks of alcohol   Drug use: No   Sexual activity: Never  Other Topics Concern   Not on file  Social History Narrative   Not on file   Social Determinants of Health   Financial Resource Strain: Not on file  Food Insecurity: Not on file  Transportation Needs: Not on file  Physical Activity: Not on file  Stress: Not on file  Social Connections: Not on  file     Family History: The patient's family history includes Aneurysm in her paternal grandmother; Breast cancer in her maternal aunt; Cancer in her maternal grandmother and sister; Diabetes in her brother and sister; Heart failure in her brother, father, and maternal grandfather; Hypertension in her brother; Rheum arthritis in her mother.  ROS:   Please see the history of present illness.    All other systems reviewed and are negative.  EKGs/Labs/Other Studies Reviewed:    The following studies were reviewed today:  02/01/2022 [employer] MCT HR 48 - 119bpm, average 66 bpm. No atrial fibrillation detected. Rare supraventricular and ventricular ectopy.  Recent Labs: 12/14/2021: ALT 9; BUN 17;  Creatinine, Ser 0.94; Hemoglobin 11.6; Magnesium 1.8; Platelets 257; Potassium 4.5; Sodium 142; TSH 2.310  Recent Lipid Panel    Component Value Date/Time   CHOL 192 12/15/2021 0353   TRIG 35 12/15/2021 0353   HDL 44 12/15/2021 0353   CHOLHDL 4.4 12/15/2021 0353   VLDL 7 12/15/2021 0353   LDLCALC 141 (H) 12/15/2021 0353    Physical Exam:    VS:  There were no vitals taken for this visit.    Wt Readings from Last 3 Encounters:  02/16/22 122 lb 9.6 oz (55.6 kg)  01/17/22 123 lb (55.8 kg)  01/05/22 121 lb 9.6 oz (55.2 kg)     GEN: *** Well nourished, well developed in no acute distress HEENT: Normal NECK: No JVD; No carotid bruits LYMPHATICS: No lymphadenopathy CARDIAC: ***RRR, no murmurs, rubs, gallops RESPIRATORY:  Clear to auscultation without rales, wheezing or rhonchi  ABDOMEN: Soft, non-tender, non-distended MUSCULOSKELETAL:  No edema; No deformity  SKIN: Warm and dry NEUROLOGIC:  Alert and oriented x 3 PSYCHIATRIC:  Normal affect       ASSESSMENT:    1. Cryptogenic stroke ([place])   2. Essential hypertension    PLAN:    In order of problems listed above:  #Cryptogenic stroke No evidence of AF thus far. Imaging showed separate regions with infarction c/w possible cardioembolic source. I have discussed loop recorder monitoring including the implant procedure and risks and monthly monitorin costs and she wishes to proceed.    #Hypertension *** goal today.  Recommend checking blood pressures 1-2 times per week at home and recording the values.  Recommend bringing these recordings to the primary care physician.     Total time spent with patient today *** minutes. This includes reviewing records, evaluating the patient and coordinating care.  Medication Adjustments/Labs and Tests Ordered: Current medicines are reviewed at length with the patient today.  Concerns regarding medicines are outlined above.  No orders of the defined types were placed in this  encounter.  No orders of the defined types were placed in this encounter.    Signed, Cynthia Cork. Quentin Ore, MD, [facility], [facility] 03/20/2022 3:19 PM    Electrophysiology [facility]   ------------------------  SURGEON:  Cynthia Epley, MD     PREPROCEDURE DIAGNOSIS:  {LOOPDIAGNOSES:27737::"Cryptogenic stroke"}    POSTPROCEDURE DIAGNOSIS: {LOOPDIAGNOSES:27737::"Cryptogenic stroke"}     PROCEDURES:   1. Implantable loop recorder implantation    INTRODUCTION:  NANCEY KREITZ presents with a history of {LOOPDIAGNOSESlowercase:27738::"cryptogenic stroke"} The costs of loop recorder monitoring have been discussed with the patient.    DESCRIPTION OF PROCEDURE:  Informed written consent was obtained.  The patient required no sedation for the procedure today.  Mapping over the patient's chest was performed to identify the area where electrograms were most prominent for ILR recording.  This area was found to be the left parasternal region over the 4th intercostal space. The patients left chest was therefore prepped and draped in the usual sterile fashion. The skin overlying the left parasternal region was infiltrated with lidocaine for local analgesia.  A 0.5-cm incision was made over the left parasternal region over the 3rd intercostal space.  A subcutaneous ILR pocket was fashioned using a combination of sharp and blunt dissection.  A {LOOPLIST:27736} implantable loop recorder was then placed into the pocket  R waves were very prominent and measured >0.46m.  Steri- Strips and a sterile dressing were then applied.  There were no early apparent complications.     CONCLUSIONS:   1. Successful implantation of a implantable loop recorder for {LOOPDIAGNOSES:27737::"Cryptogenic stroke"}  2. No early apparent complications.   CLysbeth GalasT. LQuentin Ore MD, [facility]stown[facility]iac Electrophysiology

## 2022-03-21 ENCOUNTER — Ambulatory Visit: Payer: Medicare PPO | Admitting: Cardiology

## 2022-03-21 ENCOUNTER — Encounter: Payer: Self-pay | Admitting: Cardiology

## 2022-03-21 VITALS — BP 116/78 | HR 67 | Ht 60.0 in | Wt 124.2 lb

## 2022-03-21 DIAGNOSIS — I639 Cerebral infarction, unspecified: Secondary | ICD-10-CM

## 2022-03-21 DIAGNOSIS — I10 Essential (primary) hypertension: Secondary | ICD-10-CM

## 2022-03-21 NOTE — Progress Notes (Signed)
Electrophysiology Office Note:    Date:  03/21/2022   ID:  [name], DOB September 30, 1935, MRN [id number]  PCP:  Jonathon Jordan, MD  [facility] Cardiologist:  None  [facility] Electrophysiologist:  Vickie Epley, MD   Referring MD: Jonathon Jordan, MD   Chief Complaint:  stroke  History of Present Illness:    Cynthia [name] is a 86 y.o. female who presents for an evaluation of stroke at the request of Dr Tomi Likens. Their medical history includes HTN, DVT, colon CA, cervical CA. The patient last saw Dr Tomi Likens with neurology 01/17/2022. Her stroke was in May 2023 after she experienced acute RLE/RUE weakness.Imaging showed 2 separate areas of infarct suspicious for cardioembolic source.  Today, she is accompanied by her daughter. She states that she is doing fine. We reviewed the results of her recent heart monitor.  She notes that her atorvastatin was restarted after her recent hospital admission. She endorses residual numbness due to her prior strokes.  They deny any palpitations, chest pain, shortness of breath, or peripheral edema. No lightheadedness, headaches, syncope, orthopnea, or PND.    Past Medical History:  Diagnosis Date   Anemia    Blood transfusion without reported diagnosis jan 2015   C. difficile colitis 03/05/14, 03/22/14, 04/05/14   recurrent   Cancer ([place])    liver   Cervical cancer ([place]) 1972   Colon cancer ([place]) JAN 2015   Colon cancer ([place])    Complication of anesthesia    slow to awaken in past, DONE WELL RECENTLY   DVT (deep venous thrombosis) ([place]) 10/24/13   Right leg   Guillain-Barre ([place]) 09/2015   Heart murmur    Hypertension    no bp meds    Liver cyst    Stroke ([place]) 12/2014   NUMBNESS ON LEFT SIDE   Tinnitus of both ears ALL THE TIME    Past Surgical History:  Procedure Laterality Date   ABDOMINAL HYSTERECTOMY     vaginal- partial   BACK SURGERY  1981   lower lumbar   ESOPHAGOGASTRODUODENOSCOPY N/A 10/14/2015   Procedure:  ESOPHAGOGASTRODUODENOSCOPY (EGD);  Surgeon: Clarene Essex, MD;  Location: [facility] ENDOSCOPY;  Service: Endoscopy;  Laterality: N/A;  Bedside in ICU   IR CV LINE INJECTION  07/20/2020   IR GENERIC HISTORICAL  09/07/2016   IR RADIOLOGIST EVAL & MGMT 09/07/2016 Aletta Edouard, MD [facility] INTERV RAD   IR IMAGING GUIDED PORT INSERTION  07/29/2019   LAPAROSCOPIC RIGHT COLECTOMY Right 10/22/2013   Procedure: LAPAROSCOPIC RIGHT COLECTOMY ;  Surgeon: [name], MD;  Location: [facility] ORS;  Service: General;  Laterality: Right;   SALPINGOOPHORECTOMY  10/22/2013   Procedure: Lesly Dukes OOPHORECTOMY;  Surgeon: [name]. [name], MD;  Location: [facility] ORS;  Service: Gynecology;;   TUBAL LIGATION  1968    Current Medications: Current Meds  Medication Sig   acetaminophen (TYLENOL) 500 MG tablet Take 1,000 mg by mouth every 6 (six) hours as needed for mild pain.    aspirin EC 81 MG EC tablet Take 1 tablet (81 mg total) by mouth daily. Swallow whole.   atorvastatin (LIPITOR) 40 MG tablet Take 1 tablet (40 mg total) by mouth daily.   Cholecalciferol (VITAMIN D3) 25 MCG (1000 UT) CAPS 1 capsule   gabapentin (NEURONTIN) 400 MG capsule TAKE 1 CAPSULE BY MOUTH THREE TIMES DAILY. MAY TAKE ADDITIONAL DOSE AT NIGHT AS NEEDED   lidocaine-prilocaine (EMLA) cream Apply 1 application topically as directed. Apply to port site 1 hour prior  to stick and cover with plastic wrap   loperamide (IMODIUM) 2 MG capsule Take 2-4 mg by mouth as needed for diarrhea or loose stools.   metoprolol succinate (TOPROL-XL) 50 MG 24 hr tablet Take 100 mg by mouth daily. Take with or immediately following a meal.   vitamin B-12 (CYANOCOBALAMIN) 1000 MCG tablet 1 tablet     Allergies:   Morphine and related   Social History   Socioeconomic History   Marital status: Widowed    Spouse name: Not on file   Number of children: Not on file   Years of education: Not on file   Highest education level: Not on file  Occupational History   Not on file   Tobacco Use   Smoking status: Never   Smokeless tobacco: Never  Substance and Sexual Activity   Alcohol use: No    Alcohol/week: 0.0 standard drinks of alcohol   Drug use: No   Sexual activity: Never  Other Topics Concern   Not on file  Social History Narrative   Not on file   Social Determinants of Health   Financial Resource Strain: Not on file  Food Insecurity: Not on file  Transportation Needs: Not on file  Physical Activity: Not on file  Stress: Not on file  Social Connections: Not on file     Family History: The patient's family history includes Aneurysm in her paternal grandmother; Breast cancer in her maternal aunt; Cancer in her maternal grandmother and sister; Diabetes in her brother and sister; Heart failure in her brother, father, and maternal grandfather; Hypertension in her brother; Rheum arthritis in her mother.  ROS:   Please see the history of present illness.    All other systems reviewed and are negative.  EKGs/Labs/Other Studies Reviewed:    The following studies were reviewed today:  02/01/2022 [employer] MCT HR 48 - 119bpm, average 66 bpm. No atrial fibrillation detected. Rare supraventricular and ventricular ectopy.   EKG:   EKG is personally reviewed.  03/21/2022:  EKG was not ordered.  Recent Labs: 12/14/2021: ALT 9; BUN 17; Creatinine, Ser 0.94; Hemoglobin 11.6; Magnesium 1.8; Platelets 257; Potassium 4.5; Sodium 142; TSH 2.310   Recent Lipid Panel    Component Value Date/Time   CHOL 192 12/15/2021 0353   TRIG 35 12/15/2021 0353   HDL 44 12/15/2021 0353   CHOLHDL 4.4 12/15/2021 0353   VLDL 7 12/15/2021 0353   LDLCALC 141 (H) 12/15/2021 0353    Physical Exam:    VS:  BP 116/78   Pulse 67   Ht 5' (1.524 m)   Wt 124 lb 3.2 oz (56.3 kg)   SpO2 94%   BMI 24.26 kg/m     Wt Readings from Last 3 Encounters:  03/21/22 124 lb 3.2 oz (56.3 kg)  02/16/22 122 lb 9.6 oz (55.6 kg)  01/17/22 123 lb (55.8 kg)     GEN: Well nourished, well  developed in no acute distress HEENT: Normal NECK: No JVD; No carotid bruits LYMPHATICS: No lymphadenopathy CARDIAC: RRR, no murmurs, rubs, gallops RESPIRATORY:  Clear to auscultation without rales, wheezing or rhonchi  ABDOMEN: Soft, non-tender, non-distended MUSCULOSKELETAL:  No edema; No deformity  SKIN: Warm and dry NEUROLOGIC:  Alert and oriented x 3 PSYCHIATRIC:  Normal affect       ASSESSMENT:    1. Cryptogenic stroke ([place])   2. Essential hypertension    PLAN:    In order of problems listed above:  #Cryptogenic stroke No evidence of AF thus  far. Imaging showed separate regions with infarction c/w possible cardioembolic source. I have discussed loop recorder monitoring including the implant procedure and risks and monthly monitorin costs and she wishes to proceed.      #Hypertension At goal today.  Recommend checking blood pressures 1-2 times per week at home and recording the values.  Recommend bringing these recordings to the primary care physician.   Medication Adjustments/Labs and Tests Ordered: Current medicines are reviewed at length with the patient today.  Concerns regarding medicines are outlined above.  No orders of the defined types were placed in this encounter.  No orders of the defined types were placed in this encounter.   I,Mathew Stumpf,acting as a Education administrator for Vickie Epley, MD.,have documented all relevant documentation on the behalf of Vickie Epley, MD,as directed by  Vickie Epley, MD while in the presence of Vickie Epley, MD.  I, Vickie Epley, MD, have reviewed all documentation for this visit. The documentation on 03/21/22 for the exam, diagnosis, procedures, and orders are all accurate and complete.   Signed, Hilton Cork. Quentin Ore, MD, [facility], [facility] 03/21/2022 5:07 PM    Electrophysiology [facility]  ---------  SURGEON:  Vickie Epley, MD     PREPROCEDURE DIAGNOSIS:  Cryptogenic stroke     POSTPROCEDURE DIAGNOSIS: Cryptogenic stroke     PROCEDURES:   1. Implantable loop recorder implantation    INTRODUCTION:  KYLIYAH STIRN presents with a history of cryptogenic stroke The costs of loop recorder monitoring have been discussed with the patient.    DESCRIPTION OF PROCEDURE:  Informed written consent was obtained.  The patient required no sedation for the procedure today.  Mapping over the patient's chest was performed to identify the area where electrograms were most prominent for ILR recording.  This area was found to be the left parasternal region over the 4th intercostal space. The patients left chest was therefore prepped and draped in the usual sterile fashion. The skin overlying the left parasternal region was infiltrated with lidocaine for local analgesia.  A 0.5-cm incision was made over the left parasternal region over the 3rd intercostal space.  A subcutaneous ILR pocket was fashioned using a combination of sharp and blunt dissection.  A Medtronic Reveal [place] II [id number]) implantable loop recorder was then placed into the pocket  R waves were very prominent and measured >0.46m.  Steri- Strips and a sterile dressing were then applied.  There were no early apparent complications.     CONCLUSIONS:   1. Successful implantation of a implantable loop recorder for Cryptogenic stroke  2. No early apparent complications.   CLysbeth GalasT. LQuentin Ore MD, FNorth Vista [facility] FBay State [facility] CentersCardiac Electrophysiology

## 2022-03-21 NOTE — Patient Instructions (Signed)
Medication Instructions:  Your physician recommends that you continue on your current medications as directed. Please refer to the Current Medication list given to you today.  Labwork: None ordered.  Testing/Procedures: None ordered.  Follow-Up:  Your physician wants you to follow-up in: as needed in person. Dr. Quentin Ore will monitor your device remotely.    Implantable Loop Recorder Placement, Care After This sheet gives you information about how to care for yourself after your procedure. Your health care provider may also give you more specific instructions. If you have problems or questions, contact your health care provider. What can I expect after the procedure? After the procedure, it is common to have: Soreness or discomfort near the incision. Some swelling or bruising near the incision.  Follow these instructions at home: Incision care  Monitor your cardiac device site for redness, swelling, and drainage. Call the device clinic at [phone] if you experience these symptoms or fever/chills.  Keep the large square bandage on your site for 24 hours and then you may remove it yourself. Keep the steri-strips underneath in place.   You may shower after 72 hours / 3 days from your procedure with the steri-strips in place. They will usually fall off on their own, or may be removed after 10 days. Pat dry.   Avoid lotions, ointments, or perfumes over your incision until it is well-healed.  Please do not submerge in water until your site is completely healed.   Your device is MRI compatible.   Remote monitoring is used to monitor your cardiac device from home. This monitoring is scheduled every month by our office. It allows [place] to keep an eye on the function of your device to ensure it is working properly.  If your wound site starts to bleed apply pressure.    For help with the monitor please call Med[employer]onic Monitor Support Specialist directly at [phone].    If you have  any questions/concerns please call the device clinic at [phone].  Activity  Return to your normal activities.  General instructions Follow instructions from your health care provider about how to manage your implantable loop recorder and transmit the information. Learn how to activate a recording if this is necessary for your type of device. You may go through a metal detection gate, and you may let someone hold a metal detector over your chest. Show your ID card if needed. Do not have an MRI unless you check with your health care provider first. Take over-the-counter and prescription medicines only as told by your health care provider. Keep all follow-up visits as told by your health care provider. This is important. Contact a health care provider if: You have redness, swelling, or pain around your incision. You have a fever. You have pain that is not relieved by your pain medicine. You have triggered your device because of fainting (syncope) or because of a heartbeat that feels like it is racing, slow, fluttering, or skipping (palpitations). Get help right away if you have: Chest pain. Difficulty breathing. Summary After the procedure, it is common to have soreness or discomfort near the incision. Change your dressing as told by your health care provider. Follow instructions from your health care provider about how to manage your implantable loop recorder and transmit the information. Keep all follow-up visits as told by your health care provider. This is important. This information is not intended to replace advice given to you by your health care provider. Make sure you discuss any questions you have with  your health care provider. Document Released: 07/13/2015 Document Revised: 09/16/2017 Document Reviewed: 09/16/2017 Elsevier Patient Education  2020 [employer].

## 2022-03-26 ENCOUNTER — Other Ambulatory Visit: Payer: Self-pay | Admitting: Neurology

## 2022-04-13 ENCOUNTER — Encounter: Payer: Self-pay | Admitting: Nurse Practitioner

## 2022-04-13 ENCOUNTER — Inpatient Hospital Stay: Payer: Medicare PPO | Admitting: Nurse Practitioner

## 2022-04-13 ENCOUNTER — Inpatient Hospital Stay: Payer: Medicare PPO | Attending: Oncology

## 2022-04-13 VITALS — BP 147/49 | HR 70 | Temp 98.2°F | Resp 20 | Ht 60.0 in | Wt 123.0 lb

## 2022-04-13 DIAGNOSIS — R0600 Dyspnea, unspecified: Secondary | ICD-10-CM | POA: Diagnosis not present

## 2022-04-13 DIAGNOSIS — C189 Malignant neoplasm of colon, unspecified: Secondary | ICD-10-CM | POA: Diagnosis not present

## 2022-04-13 DIAGNOSIS — C787 Secondary malignant neoplasm of liver and intrahepatic bile duct: Secondary | ICD-10-CM

## 2022-04-13 DIAGNOSIS — C180 Malignant neoplasm of cecum: Secondary | ICD-10-CM | POA: Diagnosis not present

## 2022-04-13 DIAGNOSIS — Z8541 Personal history of malignant neoplasm of cervix uteri: Secondary | ICD-10-CM | POA: Insufficient documentation

## 2022-04-13 DIAGNOSIS — Z86718 Personal history of other venous thrombosis and embolism: Secondary | ICD-10-CM | POA: Diagnosis not present

## 2022-04-13 DIAGNOSIS — C7800 Secondary malignant neoplasm of unspecified lung: Secondary | ICD-10-CM | POA: Insufficient documentation

## 2022-04-13 DIAGNOSIS — R059 Cough, unspecified: Secondary | ICD-10-CM | POA: Diagnosis not present

## 2022-04-13 NOTE — Progress Notes (Signed)
[facility] OFFICE PROGRESS NOTE   Diagnosis: Colon cancer  INTERVAL HISTORY:   Cynthia [name] returns as scheduled.  Appetite is good.  Energy level varies.  Limited exercise due to the heat.  Bowels moving regularly.  No change over the right lateral chest wall.  Stable cough and dyspnea, ongoing for about 6 months.  Symptoms tend to occur in the afternoon hours.  Objective:  Vital signs in last 24 hours:  Blood pressure (!) 147/49, pulse 70, temperature 98.2 F (36.8 C), temperature source Oral, resp. rate 20, height 5' (1.524 m), weight 123 lb (55.8 kg), SpO2 98 %.    HEENT: No thrush or ulcers. Lymphatics: No palpable cervical, supraclavicular or axillary lymph nodes. Resp: Lungs clear bilaterally. Cardio: Regular rate and rhythm. GI: No hepatosplenomegaly. Vascular: No leg edema. Neuro: Alert and oriented. Skin: Mild skin thickening mid right lateral chest wall, no erythema, no mass. Port-A-Cath without erythema.   Lab Results:  Lab Results  Component Value Date   WBC 5.8 12/14/2021   HGB 11.6 (L) 12/14/2021   HCT 37.4 12/14/2021   MCV 89.0 12/14/2021   PLT 257 12/14/2021   NEUTROABS 4.0 12/14/2021    Imaging:  No results found.  Medications: I have reviewed the patient's current medications.  Assessment/Plan: Moderately differentiated adenocarcinoma of the cecum, stage III (T4a, N1), status post a laparoscopic assisted right colectomy 10/22/2013 The tumor returned microsatellite stable with normal mismatch repair protein expression   Cycle 1 adjuvant Xeloda 12/25/2013.   Cycle 2 adjuvant Xeloda 01/15/2014.   Cycle 3 adjuvant Xeloda 02/05/2014.   Cycle 4 adjuvant Xeloda 03/18/2014, discontinued after 4 days secondary to diarrhea   Cycle 5 adjuvant Xeloda 04/16/2014, Xeloda dose reduced to 1000 mg in the a.m. and 500 mg in the p.m.   Cycle 6 adjuvant Xeloda 05/07/2014. Cycle 7 adjuvant Xeloda 05/28/2014. Cycle 8 adjuvant Xeloda  06/18/2014. Restaging CTs on 07/14/2014 with no evidence of metastatic colon cancer Colonoscopy 10/02/2014 with diverticulosis in the sigmoid colon and in the descending colon. One 4 mm polyp in the sigmoid colon with complete resection. Polyp tissue not retrieved. Internal hemorrhoids. Next colonoscopy in 3 years for surveillance. Restaging CTs 07/16/2015 with 2 new hypodense liver lesions concerning for metastases, no other evidence of disease progression PET scan 07/28/2015 with a single hypermetabolic right liver lesion, no other evidence of metastatic disease Ultrasound-guided biopsy of the right liver lesion 07/30/2015 confirmed metastatic colon cancer, MSS, tumor mutation burden 1, KRAS G12D, PIK3CA, PTEN MRI abdomen 08/20/2015 with similar to slight decrease in size of 2 hepatic metastasis. No new liver lesions or extrahepatic metastatic disease identified. Radiofrequency ablation of 2 liver lesions 09/25/2015 MRI abdomen 02/02/2016-coagulative necrosis at the 2 ablated liver tumor sites, with no enhancement to suggest residual or recurrent liver tumor. No new liver masses or other findings of metastatic disease in the abdomen. MRI abdomen 08/10/2016 revealed stable ablation sites, no evidence of progressive metastatic disease Restaging CT 02/08/2017-stable treated liver lesions, no evidence of progressive metastatic disease, stable borderline enlarged right inguinal node  CEA with stable mild elevation 02/08/2017, 03/01/2017, 03/29/2017, 04/19/2017 CEA further elevated 08/21/2017 CTs 09/05/2017- possible local recurrence of tumor along the ablation site right hepatic lobe lesion.  Cephalad to the ablation site approximately 1.9 x 2.1 cm soft tissue mass progressive compared to the prior exam. Liver SBRT 10/17/2017, 10/19/2017, 10/23/2017, 10/25/2017, 10/27/2017 CT 02/19/2018- progressive ill-defined areas of low attenuation with enhancement surrounding the segment 6 ablation site, stable segment 8 ablation  site enlargement of extracapsular lesion posteriorly, no new hepatic lesions, wall thickening/edema at the distal ileum CT 05/22/2018- 2 new lung nodules suspicious for metastases, stable segment 8 ablation zone, slight decrease in the size of delayed perfusion at the segment 6 ablation zone, posterior extracapsular lesion is slightly smaller CT chest 07/13/2018- multiple lung nodules consistent with metastatic disease Cycle 1 Xeloda 07/23/2018 Cycle 2 Xeloda 08/13/2018 Cycle 3 Xeloda 09/03/2018 Cycle 4 Xeloda 09/24/2018 Restaging chest CT 10/12/2018- scattered pulmonary nodules/metastases mildly improved. Cycle 5 Xeloda 10/15/2018 Cycle 6 Xeloda 11/05/2018 Cycle 7 Xeloda 11/26/2018 Cycle 8 Xeloda 12/17/2018 Cycle 9 Xeloda 01/07/2019 CT chest 01/24/2019- stable pulmonary nodules.  No new pulmonary nodules.  Stable left hepatic lobe cysts and right hepatic lobe lesions.  No new hepatic lesions.  Stable right ninth and 10th rib lesions. Xeloda daily dosing schedule beginning 01/28/2019 due to hand-foot syndrome CT 05/27/2019- enlargement of lung metastases, similar right hepatic lobe lesion CT 08/05/2019-enlargement of bilateral lung nodules, no new lesions, stable right hepatic lesion Cycle 1 FOLFIRI 08/13/2019 Cycle 2 FOLFIRI 09/04/2019, Irinotecan dose reduced due to neutropenia Cycle 3 FOLFIRI 09/17/2019 Cycle 4 FOLFIRI 10/08/2019, 5-FU and irinotecan dose reduced CT chest 10/28/2019-bilateral pulmonary metastases with minimal improvement.  Stable ablation zone in the posterior right hepatic lobe. CT chest 03/03/2020-enlargement of pulmonary metastases, decreased right hepatic ablation site CT chest 07/07/2020-enlargement of pulmonary metastases, stable ablation site involving right hepatic lobe Cycle 1 FOLFIRI/Avastin 07/27/2020 Cycle 2 FOLFIRI/Avastin 08/18/2020 Treatment held 09/08/2020 due to neutropenia Cycle 3 FOLFIRI/Avastin 09/17/2020 (irinotecan dose reduced due to neutropenia) Cycle 4 FOLFIRI/Avastin  10/06/2020 Chemotherapy discontinued per patient request CT chest 11/16/2020-decrease in size of dominant right perihilar nodule, other lung nodules are stable, stable right liver ablation site Right ovary cystadenofibroma, status post a bilateral oophorectomy 10/22/2013 Remote history of cervical cancer. Iron deficiency anemia-resolved. Right calf deep vein thrombosis 10/24/2013-she completed 3 months of xarelto. Indeterminate 12 mm hypermetabolic right pelvic density on the staging PET scan 10/04/2013-CT followup recommended per the GI tumor conference 11/13/2013. No longer visualized on a CT 07/14/2014. Soft tissue implant overlying the posterior right liver on the CT 16/05/9603-VWU hypermetabolic on the PET scan 98/06/9146. C. difficile colitis 03/05/2014. She completed a course of Flagyl, recurrent diarrhea beginning 03/21/2014, positive C. difficile toxin 03/26/2014-resumed Flagyl for planned 10 day course. Vancomycin beginning 04/08/2014. Taper complete 05/21/2014.   9.  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  Xeloda dose/schedule adjusted beginning 01/28/2019. 10.  CVA-left centrum semiovale and left caudate 12/14/2021 11.  03/21/2022-implantation of loop recorder      Disposition: Ms. Blasing appears unchanged.  There is no clinical evidence for progression of the colon cancer.  The plan is to continue observation.  She would like to have a CEA checked next office visit.  She is not interested in restaging CT scans.  She will return for Port-A-Cath flush/CEA and follow-up visit in 8 weeks.    Ned Card ANP/GNP-BC   04/13/2022  10:47 AM

## 2022-04-25 ENCOUNTER — Ambulatory Visit (INDEPENDENT_AMBULATORY_CARE_PROVIDER_SITE_OTHER): Payer: Medicare PPO

## 2022-04-25 DIAGNOSIS — I639 Cerebral infarction, unspecified: Secondary | ICD-10-CM | POA: Diagnosis not present

## 2022-04-26 LAB — CUP PACEART REMOTE DEVICE CHECK
Date Time Interrogation Session: 20230910020957
Implantable Pulse Generator Implant Date: 20230807

## 2022-05-12 NOTE — Progress Notes (Signed)
[facility] Summary Report / Loop Recorder 

## 2022-05-30 ENCOUNTER — Ambulatory Visit (INDEPENDENT_AMBULATORY_CARE_PROVIDER_SITE_OTHER): Payer: Medicare PPO

## 2022-05-30 DIAGNOSIS — I639 Cerebral infarction, unspecified: Secondary | ICD-10-CM

## 2022-05-31 LAB — CUP PACEART REMOTE DEVICE CHECK
Date Time Interrogation Session: 20231013020910
Implantable Pulse Generator Implant Date: 20230807

## 2022-06-08 ENCOUNTER — Inpatient Hospital Stay: Payer: Medicare PPO | Attending: Oncology

## 2022-06-08 ENCOUNTER — Inpatient Hospital Stay: Payer: Medicare PPO | Admitting: Oncology

## 2022-06-08 ENCOUNTER — Inpatient Hospital Stay: Payer: Medicare PPO

## 2022-06-08 VITALS — BP 139/76 | HR 75 | Temp 98.2°F | Resp 18 | Ht 60.0 in | Wt 122.0 lb

## 2022-06-08 DIAGNOSIS — C180 Malignant neoplasm of cecum: Secondary | ICD-10-CM | POA: Insufficient documentation

## 2022-06-08 DIAGNOSIS — C189 Malignant neoplasm of colon, unspecified: Secondary | ICD-10-CM

## 2022-06-08 DIAGNOSIS — R970 Elevated carcinoembryonic antigen [CEA]: Secondary | ICD-10-CM | POA: Diagnosis not present

## 2022-06-08 DIAGNOSIS — C7800 Secondary malignant neoplasm of unspecified lung: Secondary | ICD-10-CM | POA: Diagnosis not present

## 2022-06-08 DIAGNOSIS — C787 Secondary malignant neoplasm of liver and intrahepatic bile duct: Secondary | ICD-10-CM

## 2022-06-08 LAB — CEA (ACCESS): CEA (CHCC): 11.03 ng/mL — ABNORMAL HIGH (ref 0.00–5.00)

## 2022-06-08 NOTE — Progress Notes (Signed)
[facility] OFFICE PROGRESS NOTE   Diagnosis: Colon cancer  INTERVAL HISTORY:   Ms. Cooperwood returns as scheduled.  She is here with her daughter.  She feels well.  Persistent tingling in the legs.  This is improved with gabapentin.  She noted increased erythema at the right lateral chest wall for the past 3-4 days.  This has improved today.  No pain.  Objective:  Vital signs in last 24 hours:  Blood pressure 139/76, pulse 75, temperature 98.2 F (36.8 C), temperature source Oral, resp. rate 18, height 5' (1.524 m), weight 122 lb (55.3 kg), SpO2 98 %.     Lymphatics: No cervical, supraclavicular, axillary, or inguinal nodes Resp: Lungs clear bilaterally Cardio: Regular rate and rhythm GI: No hepatosplenomegaly, nontender Vascular: No leg edema  Skin: Area of skin thickening and smooth induration at the right lateral chest wall.  No erythema.  No discrete mass.  Nontender.  Portacath/PICC-without erythema  Lab Results:  Lab Results  Component Value Date   WBC 5.8 12/14/2021   HGB 11.6 (L) 12/14/2021   HCT 37.4 12/14/2021   MCV 89.0 12/14/2021   PLT 257 12/14/2021   NEUTROABS 4.0 12/14/2021    CMP  Lab Results  Component Value Date   NA 142 12/14/2021   K 4.5 12/14/2021   CL 104 12/14/2021   CO2 29 12/14/2021   GLUCOSE 91 12/14/2021   BUN 17 12/14/2021   CREATININE 0.94 12/14/2021   CALCIUM 9.6 12/14/2021   PROT 7.2 12/14/2021   ALBUMIN 3.8 12/14/2021   AST 20 12/14/2021   ALT 9 12/14/2021   ALKPHOS 49 12/14/2021   BILITOT 0.4 12/14/2021   GFRNONAA >60 12/14/2021   GFRAA >60 10/08/2019    Lab Results  Component Value Date   CEA1 4.27 05/24/2021   CEA 11.03 (H) 06/08/2022    Medications: I have reviewed the patient's current medications.   Assessment/Plan:  Moderately differentiated adenocarcinoma of the cecum, stage III (T4a, N1), status post a laparoscopic assisted right colectomy 10/22/2013 The tumor returned microsatellite  stable with normal mismatch repair protein expression   Cycle 1 adjuvant Xeloda 12/25/2013.   Cycle 2 adjuvant Xeloda 01/15/2014.   Cycle 3 adjuvant Xeloda 02/05/2014.   Cycle 4 adjuvant Xeloda 03/18/2014, discontinued after 4 days secondary to diarrhea   Cycle 5 adjuvant Xeloda 04/16/2014, Xeloda dose reduced to 1000 mg in the a.m. and 500 mg in the p.m.   Cycle 6 adjuvant Xeloda 05/07/2014. Cycle 7 adjuvant Xeloda 05/28/2014. Cycle 8 adjuvant Xeloda 06/18/2014. Restaging CTs on 07/14/2014 with no evidence of metastatic colon cancer Colonoscopy 10/02/2014 with diverticulosis in the sigmoid colon and in the descending colon. One 4 mm polyp in the sigmoid colon with complete resection. Polyp tissue not retrieved. Internal hemorrhoids. Next colonoscopy in 3 years for surveillance. Restaging CTs 07/16/2015 with 2 new hypodense liver lesions concerning for metastases, no other evidence of disease progression PET scan 07/28/2015 with a single hypermetabolic right liver lesion, no other evidence of metastatic disease Ultrasound-guided biopsy of the right liver lesion 07/30/2015 confirmed metastatic colon cancer, MSS, tumor mutation burden 1, KRAS G12D, PIK3CA, PTEN MRI abdomen 08/20/2015 with similar to slight decrease in size of 2 hepatic metastasis. No new liver lesions or extrahepatic metastatic disease identified. Radiofrequency ablation of 2 liver lesions 09/25/2015 MRI abdomen 02/02/2016-coagulative necrosis at the 2 ablated liver tumor sites, with no enhancement to suggest residual or recurrent liver tumor. No new liver masses or other findings of metastatic disease in the  abdomen. MRI abdomen 08/10/2016 revealed stable ablation sites, no evidence of progressive metastatic disease Restaging CT 02/08/2017-stable treated liver lesions, no evidence of progressive metastatic disease, stable borderline enlarged right inguinal node  CEA with stable mild elevation 02/08/2017, 03/01/2017, 03/29/2017,  04/19/2017 CEA further elevated 08/21/2017 CTs 09/05/2017- possible local recurrence of tumor along the ablation site right hepatic lobe lesion.  Cephalad to the ablation site approximately 1.9 x 2.1 cm soft tissue mass progressive compared to the prior exam. Liver SBRT 10/17/2017, 10/19/2017, 10/23/2017, 10/25/2017, 10/27/2017 CT 02/19/2018- progressive ill-defined areas of low attenuation with enhancement surrounding the segment 6 ablation site, stable segment 8 ablation site enlargement of extracapsular lesion posteriorly, no new hepatic lesions, wall thickening/edema at the distal ileum CT 05/22/2018- 2 new lung nodules suspicious for metastases, stable segment 8 ablation zone, slight decrease in the size of delayed perfusion at the segment 6 ablation zone, posterior extracapsular lesion is slightly smaller CT chest 07/13/2018- multiple lung nodules consistent with metastatic disease Cycle 1 Xeloda 07/23/2018 Cycle 2 Xeloda 08/13/2018 Cycle 3 Xeloda 09/03/2018 Cycle 4 Xeloda 09/24/2018 Restaging chest CT 10/12/2018- scattered pulmonary nodules/metastases mildly improved. Cycle 5 Xeloda 10/15/2018 Cycle 6 Xeloda 11/05/2018 Cycle 7 Xeloda 11/26/2018 Cycle 8 Xeloda 12/17/2018 Cycle 9 Xeloda 01/07/2019 CT chest 01/24/2019- stable pulmonary nodules.  No new pulmonary nodules.  Stable left hepatic lobe cysts and right hepatic lobe lesions.  No new hepatic lesions.  Stable right ninth and 10th rib lesions. Xeloda daily dosing schedule beginning 01/28/2019 due to hand-foot syndrome CT 05/27/2019- enlargement of lung metastases, similar right hepatic lobe lesion CT 08/05/2019-enlargement of bilateral lung nodules, no new lesions, stable right hepatic lesion Cycle 1 FOLFIRI 08/13/2019 Cycle 2 FOLFIRI 09/04/2019, Irinotecan dose reduced due to neutropenia Cycle 3 FOLFIRI 09/17/2019 Cycle 4 FOLFIRI 10/08/2019, 5-FU and irinotecan dose reduced CT chest 10/28/2019-bilateral pulmonary metastases with minimal improvement.  Stable  ablation zone in the posterior right hepatic lobe. CT chest 03/03/2020-enlargement of pulmonary metastases, decreased right hepatic ablation site CT chest 07/07/2020-enlargement of pulmonary metastases, stable ablation site involving right hepatic lobe Cycle 1 FOLFIRI/Avastin 07/27/2020 Cycle 2 FOLFIRI/Avastin 08/18/2020 Treatment held 09/08/2020 due to neutropenia Cycle 3 FOLFIRI/Avastin 09/17/2020 (irinotecan dose reduced due to neutropenia) Cycle 4 FOLFIRI/Avastin 10/06/2020 Chemotherapy discontinued per patient request CT chest 11/16/2020-decrease in size of dominant right perihilar nodule, other lung nodules are stable, stable right liver ablation site Right ovary cystadenofibroma, status post a bilateral oophorectomy 10/22/2013 Remote history of cervical cancer. Iron deficiency anemia-resolved. Right calf deep vein thrombosis 10/24/2013-she completed 3 months of xarelto. Indeterminate 12 mm hypermetabolic right pelvic density on the staging PET scan 10/04/2013-CT followup recommended per the GI tumor conference 11/13/2013. No longer visualized on a CT 07/14/2014. Soft tissue implant overlying the posterior right liver on the CT 42/70/6237-SEG hypermetabolic on the PET scan 31/51/7616. C. difficile colitis 03/05/2014. She completed a course of Flagyl, recurrent diarrhea beginning 03/21/2014, positive C. difficile toxin 03/26/2014-resumed Flagyl for planned 10 day course. Vancomycin beginning 04/08/2014. Taper complete 05/21/2014.   9.  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  Xeloda dose/schedule adjusted beginning 01/28/2019. 10.  CVA-left centrum semiovale and left caudate 12/14/2021 11.  03/21/2022-implantation of loop recorder      Disposition: Cynthia [name] has static colon cancer.  She remains off of it for colon cancer.  She reconfirmed her decision to not receive further chemotherapy.  The area of induration at the right chest wall could represent recurrent  disease on a biopsy site.  She will call for  pain or enlargement of the area of induration.  She not wish to proceed with a CT at present.  There is chronic mild elevation of the CEA.  She will return for an office visit and Port-A-Cath flush in 7 weeks.  [name], MD  06/08/2022  12:14 PM

## 2022-06-23 NOTE — Progress Notes (Signed)
[facility] Summary Report / Loop Recorder 

## 2022-07-04 ENCOUNTER — Ambulatory Visit (INDEPENDENT_AMBULATORY_CARE_PROVIDER_SITE_OTHER): Payer: Medicare PPO

## 2022-07-04 DIAGNOSIS — I639 Cerebral infarction, unspecified: Secondary | ICD-10-CM

## 2022-07-05 LAB — CUP PACEART REMOTE DEVICE CHECK
Date Time Interrogation Session: 20231119230504
Implantable Pulse Generator Implant Date: 20230807

## 2022-07-12 ENCOUNTER — Other Ambulatory Visit: Payer: Self-pay | Admitting: Neurology

## 2022-07-15 ENCOUNTER — Other Ambulatory Visit: Payer: Self-pay | Admitting: Neurology

## 2022-07-15 ENCOUNTER — Other Ambulatory Visit: Payer: Self-pay

## 2022-07-15 ENCOUNTER — Telehealth: Payer: Self-pay | Admitting: Neurology

## 2022-07-15 MED ORDER — ATORVASTATIN CALCIUM 40 MG PO TABS: ORAL_TABLET | ORAL | 0 refills | Status: DC

## 2022-07-15 NOTE — Telephone Encounter (Signed)
I spoke with the patient herself and she was very sweet and understanding. She was advised that we had a opening for Dr Tomi Likens 07-19-22 but she was good at seeing him on 07-20-22. She asked that we call in her lipitor to the [facility] on [address]. After Sheena called in the 30 day supply I called patient back to let her know it was called in for her and if she had not heard anything from the [facility] by 4 to please call and let us know

## 2022-07-15 NOTE — Telephone Encounter (Signed)
Patients daughter, [name] is very ill. She was crying on the phone. Stated her mother had a stroke and she doesn't want her to end up back in the ER with another one. She was taken off of her atorvastatin and she needs to know why and if it is okay she stays off of it. She has been trying to get her mother in to see jaffe before her 12/6 appt but no one has called her regarding that or the medicine problem. She said the only time she has received a call was today from [place]. She said her mother is sick. She doesn't know where the disconnect is, but theres an issue and she needs it resolved. Her atorvastatin has not been refilled and she doesn't understand what the issue is.

## 2022-07-15 NOTE — Telephone Encounter (Signed)
LMOVm for patient daughter not sure who took the patient off the Lipitor but I looks like the patient per her last visit note 01/2022 patient is to have lipid panel done in 2 months from that visit and the patient did not do that.    Patient daughter called back, Daughter was advised that the Pharmacy never sent a request or the request was never recevied. Her phone calls are not noted or never receved.  Per Patient daughter she wants her mother medication called in and her appt needs to be moved up. Advsie the daughter we will have to run that by [facility] first. The daughter was rude and ask what fucking good are you then. I advised the daugther there is no need for that type of languge and or no need to be rude. I will have you call back when you are more calm Daugter states yes it is and hung up.  Per Dr.Jaffe Front to advised that she may move her appt up to 07/19/22 if she like and one refill of Lipitor may be called in.  One lipitor called in for patient.

## 2022-07-15 NOTE — Telephone Encounter (Signed)
Pt's daughter called in and left a message wanting this prescription refilled. She says she is getting frustrated. The pharmacy has been trying to get a hold of [place] with no response. The pt needs the medication and cannot wait until her appointment on 07/19/22. She did not state the name of the medication.

## 2022-07-15 NOTE — Telephone Encounter (Signed)
Spoke to [employer], per rep Rachael they have the script and waiting on the pharmacist to fill it out.  Advised daughter of this and was confronted with this "shit" is stupid.  Advised daughter I do not know why this is happening but this to be taken up with [facility] now we have done what was asked on our end. Per daughter she will call again on Tomorrow if not resolved by tonight advised her we are closed please call [facility].  I was advised by front desk patient daughter was rude and disrespectful to her.

## 2022-07-18 NOTE — Progress Notes (Unsigned)
NEUROLOGY FOLLOW UP OFFICE NOTE  Cynthia [name] [id number]  Assessment/Plan:   Left middle cerebral artery territory infarcts within left caudate tail and left centrum semiovale, likely secondary to small vessel disease but given involvement of two locations, would evaluate further for cardioembolic source. Hypertension Hyperlipidemia     1  Secondary stroke prevention: ASA '81mg'$  daily Atorvastatin '40mg'$  daily.  LDL goal less than 70 - recheck lipid panel in 2 months. Normotensive blood pressure Hgb A1c goal less than 7 2. Continue 30 day cardiac event monitor.  3  Mediterranean diet 4   Routine exercise 5  Follow up in 6 months.   Subjective:  Cynthia [name] is an 86 year old female with HTN and history of DVT, colon cancer with hepatic metastasis and cervical cancer who follows up for stroke. She is accompanied by her daughter who supplements history.  UPDATE: Current medications:  ASA '81mg'$  daily, atorvastatin '40mg'$  daily  Long term cardiac monitoring did not reveal a fib.  She is now with a loop recorder.  No a fib has been identified, thus far.     HISTORY: On 12/14/2021, she developed acute onset right leg and hand weakness.  Admitted to [facility] where CT head revealed no acute abnormality but MRI showed small acute infarcts in the left caudate tail and left centrum semiovale.  MRA of head showed mild left vertebral artery distal stenosis but otherwise unremarkable.  Carotid ultrasound was unremarkable.  TTE showed EF 60-65% with grade 1 diastolic dysfunction but no no atrial level shunt.  LDL was 141 and Hgb A1c was 5.7.  She was not on antithrombotic therapy prior to admission.  She was discharged on ASA '81mg'$  and Plavix '75mg'$  daily for 3 weeks followed by ASA monotherapy.  She was also started on atorvastatin '40mg'$  daily.  Undergoing outpatient PT/OT.  She no longer drags the right foot.  Still has difficulty using the right hand such as writing.  Still feels  numbness in the left hand from the first stroke.  Currently wearing a 30 day event monitor.    PAST MEDICAL HISTORY: Past Medical History:  Diagnosis Date   Anemia    Blood transfusion without reported diagnosis jan 2015   C. difficile colitis 03/05/14, 03/22/14, 04/05/14   recurrent   Cancer ([place])    liver   Cervical cancer ([place]) 1972   Colon cancer ([place]) JAN 2015   Colon cancer ([place])    Complication of anesthesia    slow to awaken in past, DONE WELL RECENTLY   DVT (deep venous thrombosis) ([facility]) 10/24/13   Right leg   Guillain-Barre ([place]) 09/2015   Heart murmur    Hypertension    no bp meds    Liver cyst    Stroke ([place]) 12/2014   NUMBNESS ON LEFT SIDE   Tinnitus of both ears ALL THE TIME    MEDICATIONS: Current Outpatient Medications on File Prior to Visit  Medication Sig Dispense Refill   acetaminophen (TYLENOL) 500 MG tablet Take 1,000 mg by mouth every 6 (six) hours as needed for mild pain.      aspirin EC 81 MG EC tablet Take 1 tablet (81 mg total) by mouth daily. Swallow whole. 30 tablet 11   atorvastatin (LIPITOR) 40 MG tablet TAKE 1 TABLET(40 MG) BY MOUTH DAILY 30 tablet 0   Cholecalciferol (VITAMIN D3) 25 MCG (1000 UT) CAPS 1 capsule     gabapentin (NEURONTIN) 400 MG capsule TAKE 1 CAPSULE BY MOUTH  THREE TIMES DAILY. MAY TAKE ADDITIONAL DOSE AT NIGHT AS NEEDED 100 capsule 5   lidocaine-prilocaine (EMLA) cream Apply 1 application topically as directed. Apply to port site 1 hour prior to stick and cover with plastic wrap 30 g 2   loperamide (IMODIUM) 2 MG capsule Take 2-4 mg by mouth as needed for diarrhea or loose stools. (Patient not taking: Reported on 06/08/2022)     metoprolol succinate (TOPROL-XL) 50 MG 24 hr tablet Take 100 mg by mouth daily. Take with or immediately following a meal.     vitamin B-12 (CYANOCOBALAMIN) 1000 MCG tablet 1 tablet     Current Facility-Administered Medications on File Prior to Visit  Medication Dose Route Frequency Provider Last Rate Last  Admin   heparin lock flush 100 unit/mL  500 Units Intracatheter Once PRN Ladell Pier, MD       sodium chloride flush (NS) 0.9 % injection 10 mL  10 mL Intracatheter PRN Ladell Pier, MD       sodium chloride flush (NS) 0.9 % injection 10 mL  10 mL Intracatheter PRN Ladell Pier, MD   10 mL at 05/24/21 0175    ALLERGIES: Allergies  Allergen Reactions   Morphine And Related Other (See Comments)    Patient has hallucinations    FAMILY HISTORY: Family History  Problem Relation Age of Onset   Rheum arthritis Mother    Heart failure Father    Diabetes Sister    Cancer Sister        leukemia   Diabetes Brother    Heart failure Brother    Hypertension Brother    Breast cancer Maternal Aunt    Cancer Maternal Grandmother        GIST   Heart failure Maternal Grandfather    Aneurysm Paternal Grandmother       Objective:  *** General: No acute distress.  Patient appears ***-groomed.   Head:  Normocephalic/atraumatic Eyes:  Fundi examined but not visualized Neck: supple, no paraspinal tenderness, full range of motion Heart:  Regular rate and rhythm Lungs:  Clear to auscultation bilaterally Back: No paraspinal tenderness Neurological Exam: alert and oriented to person, place, and time.  Speech fluent and not dysarthric, language intact.  CN II-XII intact. Bulk and tone normal, muscle strength 5/5 throughout.  Sensation to light touch intact.  Deep tendon reflexes 2+ throughout, toes downgoing.  Finger to nose testing intact.  Gait normal, Romberg negative.   Metta Clines, DO  CC: ***

## 2022-07-20 ENCOUNTER — Other Ambulatory Visit (INDEPENDENT_AMBULATORY_CARE_PROVIDER_SITE_OTHER): Payer: Medicare PPO

## 2022-07-20 ENCOUNTER — Ambulatory Visit: Payer: Medicare PPO | Admitting: Neurology

## 2022-07-20 ENCOUNTER — Encounter: Payer: Self-pay | Admitting: Neurology

## 2022-07-20 VITALS — BP 146/77 | HR 77 | Ht 61.0 in | Wt 120.8 lb

## 2022-07-20 DIAGNOSIS — E785 Hyperlipidemia, unspecified: Secondary | ICD-10-CM

## 2022-07-20 DIAGNOSIS — I10 Essential (primary) hypertension: Secondary | ICD-10-CM | POA: Diagnosis not present

## 2022-07-20 DIAGNOSIS — I6389 Other cerebral infarction: Secondary | ICD-10-CM | POA: Diagnosis not present

## 2022-07-20 DIAGNOSIS — Z5181 Encounter for therapeutic drug level monitoring: Secondary | ICD-10-CM | POA: Diagnosis not present

## 2022-07-20 LAB — LIPID PANEL
Cholesterol: 136 mg/dL (ref 0–200)
HDL: 46.1 mg/dL (ref >39.00)
LDL Cholesterol: 71 mg/dL (ref 0–99)
NonHDL: 89.89
Total CHOL/HDL Ratio: 3
Triglycerides: 95 mg/dL (ref 0.0–149.0)
VLDL: 19 mg/dL (ref 0.0–40.0)

## 2022-07-20 LAB — HEPATIC FUNCTION PANEL
ALT: 15 U/L (ref 0–35)
AST: 29 U/L (ref 0–37)
Albumin: 3.6 g/dL (ref 3.5–5.2)
Alkaline Phosphatase: 72 U/L (ref 39–117)
Bilirubin, Direct: 0.1 mg/dL (ref 0.0–0.3)
Total Bilirubin: 0.4 mg/dL (ref 0.2–1.2)
Total Protein: 7.2 g/dL (ref 6.0–8.3)

## 2022-07-20 NOTE — Patient Instructions (Signed)
Continue aspirin '81mg'$  daily Continue atorvastatin '40mg'$  daily.  Check lipid panel and liver function tests Continue metoprolol Exercises as per physical therapy Follow up 6 months.

## 2022-07-27 ENCOUNTER — Inpatient Hospital Stay: Payer: Medicare PPO

## 2022-07-27 ENCOUNTER — Inpatient Hospital Stay: Payer: Medicare PPO | Admitting: Nurse Practitioner

## 2022-07-27 ENCOUNTER — Encounter: Payer: Self-pay | Admitting: Nurse Practitioner

## 2022-07-27 ENCOUNTER — Inpatient Hospital Stay: Payer: Medicare PPO | Attending: Oncology

## 2022-07-27 VITALS — BP 144/69 | HR 77 | Temp 97.6°F | Resp 18 | Wt 115.6 lb

## 2022-07-27 DIAGNOSIS — Z95828 Presence of other vascular implants and grafts: Secondary | ICD-10-CM

## 2022-07-27 DIAGNOSIS — C7951 Secondary malignant neoplasm of bone: Secondary | ICD-10-CM | POA: Insufficient documentation

## 2022-07-27 DIAGNOSIS — Z5111 Encounter for antineoplastic chemotherapy: Secondary | ICD-10-CM | POA: Diagnosis not present

## 2022-07-27 DIAGNOSIS — R630 Anorexia: Secondary | ICD-10-CM | POA: Insufficient documentation

## 2022-07-27 DIAGNOSIS — Z452 Encounter for adjustment and management of vascular access device: Secondary | ICD-10-CM | POA: Diagnosis not present

## 2022-07-27 DIAGNOSIS — R634 Abnormal weight loss: Secondary | ICD-10-CM | POA: Diagnosis not present

## 2022-07-27 DIAGNOSIS — C787 Secondary malignant neoplasm of liver and intrahepatic bile duct: Secondary | ICD-10-CM | POA: Diagnosis not present

## 2022-07-27 DIAGNOSIS — C180 Malignant neoplasm of cecum: Secondary | ICD-10-CM | POA: Diagnosis not present

## 2022-07-27 DIAGNOSIS — C7800 Secondary malignant neoplasm of unspecified lung: Secondary | ICD-10-CM | POA: Diagnosis not present

## 2022-07-27 DIAGNOSIS — C189 Malignant neoplasm of colon, unspecified: Secondary | ICD-10-CM

## 2022-07-27 DIAGNOSIS — Z5112 Encounter for antineoplastic immunotherapy: Secondary | ICD-10-CM | POA: Insufficient documentation

## 2022-07-27 DIAGNOSIS — R109 Unspecified abdominal pain: Secondary | ICD-10-CM | POA: Insufficient documentation

## 2022-07-27 LAB — CEA (ACCESS): CEA (CHCC): 12.38 ng/mL — ABNORMAL HIGH (ref 0.00–5.00)

## 2022-07-27 MED ORDER — SODIUM CHLORIDE 0.9% FLUSH
10.0000 mL | INTRAVENOUS | Status: DC | PRN
  Administered 2022-07-27: 10 mL via INTRAVENOUS

## 2022-07-27 MED ORDER — HEPARIN SOD (PORK) LOCK FLUSH 100 UNIT/ML IV SOLN
500.0000 [IU] | Freq: Once | INTRAVENOUS | Status: AC
  Administered 2022-07-27: 500 [IU] via INTRAVENOUS

## 2022-07-27 NOTE — Progress Notes (Signed)
[facility] OFFICE PROGRESS NOTE   Diagnosis: Colon cancer  INTERVAL HISTORY:   Cynthia [name] returns as scheduled.  She reports a decreased appetite and weight loss.  3+ weeks ago she began experiencing left lateral abdominal pain.  The pain moves across the abdomen anteriorly.  The pain worsens at times after eating but can also occur unrelated to oral intake.  Pain is described as "dull and achy".  She notes that she burps a lot.  Some nausea.  No vomiting.  Bowels are moving but she wonders if she may be a little more constipated than usual.  She takes Tylenol as needed with partial relief.  Objective:  Vital signs in last 24 hours:  Blood pressure (!) 144/69, pulse 77, temperature 97.6 F (36.4 C), temperature source Tympanic, resp. rate 18, weight 115 lb 9.6 oz (52.4 kg), SpO2 95 %.    HEENT: No thrush or ulcers. Lymphatics: No palpable cervical, supraclavicular or axillary lymph nodes. Resp: Lungs clear bilaterally. Cardio: Regular rate and rhythm. GI: Abdomen soft and nontender.  No hepatosplenomegaly.  No mass. Vascular: No leg edema. Skin: Skin thickening/fullness right lateral chest wall.  No erythema. Port-A-Cath without erythema.  Lab Results:  Lab Results  Component Value Date   WBC 5.8 12/14/2021   HGB 11.6 (L) 12/14/2021   HCT 37.4 12/14/2021   MCV 89.0 12/14/2021   PLT 257 12/14/2021   NEUTROABS 4.0 12/14/2021    Imaging:  No results found.  Medications: I have reviewed the patient's current medications.  Assessment/Plan: Moderately differentiated adenocarcinoma of the cecum, stage III (T4a, N1), status post a laparoscopic assisted right colectomy 10/22/2013 The tumor returned microsatellite stable with normal mismatch repair protein expression   Cycle 1 adjuvant Xeloda 12/25/2013.   Cycle 2 adjuvant Xeloda 01/15/2014.   Cycle 3 adjuvant Xeloda 02/05/2014.   Cycle 4 adjuvant Xeloda 03/18/2014, discontinued after 4 days secondary to  diarrhea   Cycle 5 adjuvant Xeloda 04/16/2014, Xeloda dose reduced to 1000 mg in the a.m. and 500 mg in the p.m.   Cycle 6 adjuvant Xeloda 05/07/2014. Cycle 7 adjuvant Xeloda 05/28/2014. Cycle 8 adjuvant Xeloda 06/18/2014. Restaging CTs on 07/14/2014 with no evidence of metastatic colon cancer Colonoscopy 10/02/2014 with diverticulosis in the sigmoid colon and in the descending colon. One 4 mm polyp in the sigmoid colon with complete resection. Polyp tissue not retrieved. Internal hemorrhoids. Next colonoscopy in 3 years for surveillance. Restaging CTs 07/16/2015 with 2 new hypodense liver lesions concerning for metastases, no other evidence of disease progression PET scan 07/28/2015 with a single hypermetabolic right liver lesion, no other evidence of metastatic disease Ultrasound-guided biopsy of the right liver lesion 07/30/2015 confirmed metastatic colon cancer, MSS, tumor mutation burden 1, KRAS G12D, PIK3CA, PTEN MRI abdomen 08/20/2015 with similar to slight decrease in size of 2 hepatic metastasis. No new liver lesions or extrahepatic metastatic disease identified. Radiofrequency ablation of 2 liver lesions 09/25/2015 MRI abdomen 02/02/2016-coagulative necrosis at the 2 ablated liver tumor sites, with no enhancement to suggest residual or recurrent liver tumor. No new liver masses or other findings of metastatic disease in the abdomen. MRI abdomen 08/10/2016 revealed stable ablation sites, no evidence of progressive metastatic disease Restaging CT 02/08/2017-stable treated liver lesions, no evidence of progressive metastatic disease, stable borderline enlarged right inguinal node  CEA with stable mild elevation 02/08/2017, 03/01/2017, 03/29/2017, 04/19/2017 CEA further elevated 08/21/2017 CTs 09/05/2017- possible local recurrence of tumor along the ablation site right hepatic lobe lesion.  Cephalad to the  ablation site approximately 1.9 x 2.1 cm soft tissue mass progressive compared to the prior  exam. Liver SBRT 10/17/2017, 10/19/2017, 10/23/2017, 10/25/2017, 10/27/2017 CT 02/19/2018- progressive ill-defined areas of low attenuation with enhancement surrounding the segment 6 ablation site, stable segment 8 ablation site enlargement of extracapsular lesion posteriorly, no new hepatic lesions, wall thickening/edema at the distal ileum CT 05/22/2018- 2 new lung nodules suspicious for metastases, stable segment 8 ablation zone, slight decrease in the size of delayed perfusion at the segment 6 ablation zone, posterior extracapsular lesion is slightly smaller CT chest 07/13/2018- multiple lung nodules consistent with metastatic disease Cycle 1 Xeloda 07/23/2018 Cycle 2 Xeloda 08/13/2018 Cycle 3 Xeloda 09/03/2018 Cycle 4 Xeloda 09/24/2018 Restaging chest CT 10/12/2018- scattered pulmonary nodules/metastases mildly improved. Cycle 5 Xeloda 10/15/2018 Cycle 6 Xeloda 11/05/2018 Cycle 7 Xeloda 11/26/2018 Cycle 8 Xeloda 12/17/2018 Cycle 9 Xeloda 01/07/2019 CT chest 01/24/2019- stable pulmonary nodules.  No new pulmonary nodules.  Stable left hepatic lobe cysts and right hepatic lobe lesions.  No new hepatic lesions.  Stable right ninth and 10th rib lesions. Xeloda daily dosing schedule beginning 01/28/2019 due to hand-foot syndrome CT 05/27/2019- enlargement of lung metastases, similar right hepatic lobe lesion CT 08/05/2019-enlargement of bilateral lung nodules, no new lesions, stable right hepatic lesion Cycle 1 FOLFIRI 08/13/2019 Cycle 2 FOLFIRI 09/04/2019, Irinotecan dose reduced due to neutropenia Cycle 3 FOLFIRI 09/17/2019 Cycle 4 FOLFIRI 10/08/2019, 5-FU and irinotecan dose reduced CT chest 10/28/2019-bilateral pulmonary metastases with minimal improvement.  Stable ablation zone in the posterior right hepatic lobe. CT chest 03/03/2020-enlargement of pulmonary metastases, decreased right hepatic ablation site CT chest 07/07/2020-enlargement of pulmonary metastases, stable ablation site involving right hepatic  lobe Cycle 1 FOLFIRI/Avastin 07/27/2020 Cycle 2 FOLFIRI/Avastin 08/18/2020 Treatment held 09/08/2020 due to neutropenia Cycle 3 FOLFIRI/Avastin 09/17/2020 (irinotecan dose reduced due to neutropenia) Cycle 4 FOLFIRI/Avastin 10/06/2020 Chemotherapy discontinued per patient request CT chest 11/16/2020-decrease in size of dominant right perihilar nodule, other lung nodules are stable, stable right liver ablation site Right ovary cystadenofibroma, status post a bilateral oophorectomy 10/22/2013 Remote history of cervical cancer. Iron deficiency anemia-resolved. Right calf deep vein thrombosis 10/24/2013-she completed 3 months of xarelto. Indeterminate 12 mm hypermetabolic right pelvic density on the staging PET scan 10/04/2013-CT followup recommended per the GI tumor conference 11/13/2013. No longer visualized on a CT 07/14/2014. Soft tissue implant overlying the posterior right liver on the CT 63/14/9702-OVZ hypermetabolic on the PET scan 85/88/5027. C. difficile colitis 03/05/2014. She completed a course of Flagyl, recurrent diarrhea beginning 03/21/2014, positive C. difficile toxin 03/26/2014-resumed Flagyl for planned 10 day course. Vancomycin beginning 04/08/2014. Taper complete 05/21/2014.   9.  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  Xeloda dose/schedule adjusted beginning 01/28/2019. 10.  CVA-left centrum semiovale and left caudate 12/14/2021 11.  03/21/2022-implantation of loop recorder    Disposition: [name] has metastatic colon cancer.  She has been maintained off of specific therapy for close to 2 years per her request.  CEA has been stable with mild elevation.  Value from today is pending.  She has been experiencing increased abdominal pain for the past several weeks.  This is affecting her appetite.  She is losing weight.  We discussed restaging CT scans.  She agrees with CTs but reconfirms her decision to not receive further chemotherapy.  She will return  for a follow-up visit early next week to review CT results.  She understands to contact the office in the interim with worsening pain or other new symptoms.  Ned Card ANP/GNP-BC   07/27/2022  10:22 AM

## 2022-07-28 ENCOUNTER — Ambulatory Visit (HOSPITAL_COMMUNITY)
Admission: RE | Admit: 2022-07-28 | Discharge: 2022-07-28 | Disposition: A | Discharge status: Home / Self Care | Payer: Medicare PPO | Source: Ambulatory Visit | Attending: Nurse Practitioner | Admitting: Nurse Practitioner

## 2022-07-28 ENCOUNTER — Other Ambulatory Visit: Payer: Self-pay | Admitting: Nurse Practitioner

## 2022-07-28 DIAGNOSIS — C189 Malignant neoplasm of colon, unspecified: Secondary | ICD-10-CM | POA: Insufficient documentation

## 2022-07-28 DIAGNOSIS — C7951 Secondary malignant neoplasm of bone: Secondary | ICD-10-CM | POA: Diagnosis not present

## 2022-07-28 DIAGNOSIS — C7800 Secondary malignant neoplasm of unspecified lung: Secondary | ICD-10-CM | POA: Diagnosis not present

## 2022-07-28 DIAGNOSIS — C787 Secondary malignant neoplasm of liver and intrahepatic bile duct: Secondary | ICD-10-CM | POA: Insufficient documentation

## 2022-07-28 MED ORDER — TRAMADOL HCL 50 MG PO TABS: 25.0000 mg | ORAL_TABLET | Freq: Three times a day (TID) | ORAL | 0 refills | Status: DC | PRN

## 2022-08-01 ENCOUNTER — Inpatient Hospital Stay: Payer: Medicare PPO | Admitting: Nurse Practitioner

## 2022-08-01 ENCOUNTER — Encounter: Payer: Self-pay | Admitting: Nurse Practitioner

## 2022-08-01 VITALS — BP 130/71 | HR 68 | Temp 98.1°F | Resp 18 | Ht 61.0 in | Wt 123.8 lb

## 2022-08-01 DIAGNOSIS — C787 Secondary malignant neoplasm of liver and intrahepatic bile duct: Secondary | ICD-10-CM | POA: Diagnosis not present

## 2022-08-01 DIAGNOSIS — C180 Malignant neoplasm of cecum: Secondary | ICD-10-CM | POA: Diagnosis not present

## 2022-08-01 DIAGNOSIS — R109 Unspecified abdominal pain: Secondary | ICD-10-CM | POA: Diagnosis not present

## 2022-08-01 DIAGNOSIS — Z5111 Encounter for antineoplastic chemotherapy: Secondary | ICD-10-CM | POA: Diagnosis not present

## 2022-08-01 DIAGNOSIS — R634 Abnormal weight loss: Secondary | ICD-10-CM | POA: Diagnosis not present

## 2022-08-01 DIAGNOSIS — Z5112 Encounter for antineoplastic immunotherapy: Secondary | ICD-10-CM | POA: Diagnosis not present

## 2022-08-01 DIAGNOSIS — Z452 Encounter for adjustment and management of vascular access device: Secondary | ICD-10-CM | POA: Diagnosis not present

## 2022-08-01 DIAGNOSIS — C189 Malignant neoplasm of colon, unspecified: Secondary | ICD-10-CM | POA: Diagnosis not present

## 2022-08-01 DIAGNOSIS — C7951 Secondary malignant neoplasm of bone: Secondary | ICD-10-CM | POA: Diagnosis not present

## 2022-08-01 DIAGNOSIS — C7800 Secondary malignant neoplasm of unspecified lung: Secondary | ICD-10-CM | POA: Diagnosis not present

## 2022-08-01 MED ORDER — PROCHLORPERAZINE MALEATE 5 MG PO TABS: 5.0000 mg | ORAL_TABLET | Freq: Four times a day (QID) | ORAL | 0 refills | Status: AC | PRN

## 2022-08-01 NOTE — Progress Notes (Signed)
DISCONTINUE ON PATHWAY REGIMEN - Colorectal     A cycle is every 14 days:     Irinotecan      Leucovorin      Fluorouracil      Fluorouracil   **Always confirm dose/schedule in your pharmacy ordering system**  REASON: Disease Progression PRIOR TREATMENT: [id number]: FOLFIRI TREATMENT RESPONSE: Partial Response (PR)  START ON PATHWAY REGIMEN - Colorectal     A cycle is every 14 days:     Bevacizumab-xxxx      Irinotecan      Leucovorin      Fluorouracil      Fluorouracil   **Always confirm dose/schedule in your pharmacy ordering system**  Patient Characteristics: Distant Metastases, Nonsurgical Candidate, KRAS/NRAS Mutation Positive/Unknown (BRAF V600 Wild-Type/Unknown), Standard Cytotoxic Therapy, First Line Standard Cytotoxic Therapy, Bevacizumab Eligible, PS = 0,1 Tumor Location: Colon Therapeutic Status: Distant Metastases Microsatellite/Mismatch Repair Status: MSS/pMMR BRAF Mutation Status: Wild-Type (no mutation) KRAS/NRAS Mutation Status: Mutation Positive Preferred Therapy Approach: Standard Cytotoxic Therapy Standard Cytotoxic Line of Therapy: First Line Standard Cytotoxic Therapy ECOG Performance Status: 1 Bevacizumab Eligibility: Eligible Intent of Therapy: Non-Curative / Palliative Intent, Discussed with Patient

## 2022-08-01 NOTE — Progress Notes (Signed)
[facility] OFFICE PROGRESS NOTE   Diagnosis: Colon cancer  INTERVAL HISTORY:   Cynthia [name] returns as scheduled.  She was seen last week with increased pain at the left abdomen, anorexia and weight loss.  She was referred for restaging CTs.  She continues to have pain which seems to originate at the left flank/lateral abdomen and radiates anteriorly.  She tried a half tramadol tablet which helped with the pain.  Unfortunately she developed nausea/vomiting.  She had a fall this morning.  She has a bruise at the right lower forehead.  Objective:  Vital signs in last 24 hours:  Blood pressure 130/71, pulse 68, temperature 98.1 F (36.7 C), temperature source Oral, resp. rate 18, height _0  (1.549 m), weight 123 lb 12.8 oz (56.2 kg), SpO2 96 %.    Resp: Lungs clear bilaterally. Cardio: Regular rate and rhythm. GI: Abdomen soft and nontender.  No hepatosplenomegaly. Vascular: No leg edema. Neuro: Alert and oriented. Skin: Ecchymosis/associated edema right lower forehead.   Lab Results:  Lab Results  Component Value Date   WBC 5.8 12/14/2021   HGB 11.6 (L) 12/14/2021   HCT 37.4 12/14/2021   MCV 89.0 12/14/2021   PLT 257 12/14/2021   NEUTROABS 4.0 12/14/2021    Imaging:  No results found.  Medications: I have reviewed the patient's current medications.  Assessment/Plan: Moderately differentiated adenocarcinoma of the cecum, stage III (T4a, N1), status post a laparoscopic assisted right colectomy 10/22/2013 The tumor returned microsatellite stable with normal mismatch repair protein expression   Cycle 1 adjuvant Xeloda 12/25/2013.   Cycle 2 adjuvant Xeloda 01/15/2014.   Cycle 3 adjuvant Xeloda 02/05/2014.   Cycle 4 adjuvant Xeloda 03/18/2014, discontinued after 4 days secondary to diarrhea   Cycle 5 adjuvant Xeloda 04/16/2014, Xeloda dose reduced to 1000 mg in the a.m. and 500 mg in the p.m.   Cycle 6 adjuvant Xeloda 05/07/2014. Cycle 7 adjuvant  Xeloda 05/28/2014. Cycle 8 adjuvant Xeloda 06/18/2014. Restaging CTs on 07/14/2014 with no evidence of metastatic colon cancer Colonoscopy 10/02/2014 with diverticulosis in the sigmoid colon and in the descending colon. One 4 mm polyp in the sigmoid colon with complete resection. Polyp tissue not retrieved. Internal hemorrhoids. Next colonoscopy in 3 years for surveillance. Restaging CTs 07/16/2015 with 2 new hypodense liver lesions concerning for metastases, no other evidence of disease progression PET scan 07/28/2015 with a single hypermetabolic right liver lesion, no other evidence of metastatic disease Ultrasound-guided biopsy of the right liver lesion 07/30/2015 confirmed metastatic colon cancer, MSS, tumor mutation burden 1, KRAS G12D, PIK3CA, PTEN MRI abdomen 08/20/2015 with similar to slight decrease in size of 2 hepatic metastasis. No new liver lesions or extrahepatic metastatic disease identified. Radiofrequency ablation of 2 liver lesions 09/25/2015 MRI abdomen 02/02/2016-coagulative necrosis at the 2 ablated liver tumor sites, with no enhancement to suggest residual or recurrent liver tumor. No new liver masses or other findings of metastatic disease in the abdomen. MRI abdomen 08/10/2016 revealed stable ablation sites, no evidence of progressive metastatic disease Restaging CT 02/08/2017-stable treated liver lesions, no evidence of progressive metastatic disease, stable borderline enlarged right inguinal node  CEA with stable mild elevation 02/08/2017, 03/01/2017, 03/29/2017, 04/19/2017 CEA further elevated 08/21/2017 CTs 09/05/2017- possible local recurrence of tumor along the ablation site right hepatic lobe lesion.  Cephalad to the ablation site approximately 1.9 x 2.1 cm soft tissue mass progressive compared to the prior exam. Liver SBRT 10/17/2017, 10/19/2017, 10/23/2017, 10/25/2017, 10/27/2017 CT 02/19/2018- progressive ill-defined areas of low attenuation  with enhancement surrounding the  segment 6 ablation site, stable segment 8 ablation site enlargement of extracapsular lesion posteriorly, no new hepatic lesions, wall thickening/edema at the distal ileum CT 05/22/2018- 2 new lung nodules suspicious for metastases, stable segment 8 ablation zone, slight decrease in the size of delayed perfusion at the segment 6 ablation zone, posterior extracapsular lesion is slightly smaller CT chest 07/13/2018- multiple lung nodules consistent with metastatic disease Cycle 1 Xeloda 07/23/2018 Cycle 2 Xeloda 08/13/2018 Cycle 3 Xeloda 09/03/2018 Cycle 4 Xeloda 09/24/2018 Restaging chest CT 10/12/2018- scattered pulmonary nodules/metastases mildly improved. Cycle 5 Xeloda 10/15/2018 Cycle 6 Xeloda 11/05/2018 Cycle 7 Xeloda 11/26/2018 Cycle 8 Xeloda 12/17/2018 Cycle 9 Xeloda 01/07/2019 CT chest 01/24/2019- stable pulmonary nodules.  No new pulmonary nodules.  Stable left hepatic lobe cysts and right hepatic lobe lesions.  No new hepatic lesions.  Stable right ninth and 10th rib lesions. Xeloda daily dosing schedule beginning 01/28/2019 due to hand-foot syndrome CT 05/27/2019- enlargement of lung metastases, similar right hepatic lobe lesion CT 08/05/2019-enlargement of bilateral lung nodules, no new lesions, stable right hepatic lesion Cycle 1 FOLFIRI 08/13/2019 Cycle 2 FOLFIRI 09/04/2019, Irinotecan dose reduced due to neutropenia Cycle 3 FOLFIRI 09/17/2019 Cycle 4 FOLFIRI 10/08/2019, 5-FU and irinotecan dose reduced CT chest 10/28/2019-bilateral pulmonary metastases with minimal improvement.  Stable ablation zone in the posterior right hepatic lobe. CT chest 03/03/2020-enlargement of pulmonary metastases, decreased right hepatic ablation site CT chest 07/07/2020-enlargement of pulmonary metastases, stable ablation site involving right hepatic lobe Cycle 1 FOLFIRI/Avastin 07/27/2020 Cycle 2 FOLFIRI/Avastin 08/18/2020 Treatment held 09/08/2020 due to neutropenia Cycle 3 FOLFIRI/Avastin 09/17/2020 (irinotecan dose  reduced due to neutropenia) Cycle 4 FOLFIRI/Avastin 10/06/2020 Chemotherapy discontinued per patient request CT chest 11/16/2020-decrease in size of dominant right perihilar nodule, other lung nodules are stable, stable right liver ablation site CTs 07/28/2022-increase size of numerous pulmonary metastases, increased number and size of numerous bone lesions; mixed lucency and sclerosis throughout the T9 vertebral body, mild compression and approximately 20% loss of central vertebral body height most compatible with a metastatic lesion with pathologic compression fracture; expansile sclerotic lesions noted in the lateral aspect of the right ninth and 10th ribs as well as the anterolateral aspect of the left sixth rib which also has surrounding soft tissue component. Right ovary cystadenofibroma, status post a bilateral oophorectomy 10/22/2013 Remote history of cervical cancer. Iron deficiency anemia-resolved. Right calf deep vein thrombosis 10/24/2013-she completed 3 months of xarelto. Indeterminate 12 mm hypermetabolic right pelvic density on the staging PET scan 10/04/2013-CT followup recommended per the GI tumor conference 11/13/2013. No longer visualized on a CT 07/14/2014. Soft tissue implant overlying the posterior right liver on the CT 03/47/4259-DGL hypermetabolic on the PET scan 87/56/4332. C. difficile colitis 03/05/2014. She completed a course of Flagyl, recurrent diarrhea beginning 03/21/2014, positive C. difficile toxin 03/26/2014-resumed Flagyl for planned 10 day course. Vancomycin beginning 04/08/2014. Taper complete 05/21/2014.   9.  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  Xeloda dose/schedule adjusted beginning 01/28/2019. 10.  CVA-left centrum semiovale and left caudate 12/14/2021 11.  03/21/2022-implantation of loop recorder  Disposition: Cynthia [name] appears unchanged.  She continues to have pain at the right lateral abdomen extending anteriorly.  The  recent CTs show evidence of disease progression involving the lungs and bones.  The compression fracture at T9 is likely pathologic and is the likely source of her pain.  Dr. Benay Spice reviewed treatment options to include systemic therapy versus a course of radiation directed at T9.  She is not  interested in radiation.  She has pre-existing neuropathy and declines oxaliplatin.  We discussed retreatment with FOLFIRI/Avastin.  She was last treated with this regimen in February 2022 which she discontinued due to poor tolerance mainly related to her energy.  We reviewed potential toxicities.  She would like to proceed with FOLFIRI/Avastin.  She will return for cycle 1 around 08/09/2022.  We will see her in follow-up prior to cycle 2.  Patient seen with Dr. Benay Spice.  Ned Card ANP/GNP-BC   08/01/2022  2:18 PM This was a shared visit with Ned Card.  Cynthia [name] was interviewed and examined.  We reviewed the CT findings and images with her.  She has clinical and radiologic evidence of disease progression.  We discussed treatment options including palliative radiation to the thoracic spine lesion and resuming systemic therapy.  She has been maintained off of systemic therapy since last being treated with FOLFIRI/bevacizumab in February 2022.  She agrees to resume FOLFIRI/bevacizumab.  She does not wish to receive oxaliplatin due to the potential for developing neuropathy.  We reviewed potential toxicities associated with the FOLFIRI/bevacizumab regimen.  She agrees to proceed.  A chemotherapy plan was entered today.  I was present for greater than 50% of today's visit.  I performed medical decision making.  Julieanne Manson, MD

## 2022-08-01 NOTE — Progress Notes (Signed)
ON PATHWAY REGIMEN - Colorectal  No Change  Continue With Treatment as Ordered.  Original Decision Date/Time: 07/22/2019 10:13     A cycle is every 14 days:     Irinotecan      Leucovorin      Fluorouracil      Fluorouracil   **Always confirm dose/schedule in your pharmacy ordering system**  Patient Characteristics: Distant Metastases, Nonsurgical Candidate, KRAS/NRAS Mutation Positive/Unknown (BRAF V600 Wild-Type/Unknown), Standard Cytotoxic Therapy, First Line Standard Cytotoxic Therapy, Bevacizumab Ineligible, PS = 0,1 Tumor Location: Colon Therapeutic Status: Distant Metastases Microsatellite/Mismatch Repair Status: MSS/pMMR BRAF Mutation Status: Wild-Type (no mutation) KRAS/NRAS Mutation Status: Mutation Positive Standard Cytotoxic Line of Therapy: First Line Standard Cytotoxic Therapy ECOG Performance Status: 1 Bevacizumab Eligibility: Ineligible Intent of Therapy: Non-Curative / Palliative Intent, Discussed with Patient

## 2022-08-02 ENCOUNTER — Other Ambulatory Visit: Payer: Self-pay

## 2022-08-02 NOTE — Progress Notes (Signed)
Pharmacist Chemotherapy Monitoring - Initial Assessment    Anticipated start date: 08/09/22   The following has been reviewed per standard work regarding the patient's treatment regimen: The patient's diagnosis, treatment plan and drug doses, and organ/hematologic function Lab orders and baseline tests specific to treatment regimen  The treatment plan start date, drug sequencing, and pre-medications Prior authorization status  Patient's documented medication list, including drug-drug interaction screen and prescriptions for anti-emetics and supportive care specific to the treatment regimen The drug concentrations, fluid compatibility, administration routes, and timing of the medications to be used The patient's access for treatment and lifetime cumulative dose history, if applicable  The patient's medication allergies and previous infusion related reactions, if applicable   Changes made to treatment plan:  N/A  Follow up needed:  Pending authorization for treatment    Judge Stall, [place], 08/02/2022  2:18 PM

## 2022-08-05 ENCOUNTER — Encounter (HOSPITAL_BASED_OUTPATIENT_CLINIC_OR_DEPARTMENT_OTHER): Payer: Self-pay | Admitting: Emergency Medicine

## 2022-08-05 ENCOUNTER — Emergency Department (HOSPITAL_BASED_OUTPATIENT_CLINIC_OR_DEPARTMENT_OTHER)
Admission: EM | Admit: 2022-08-05 | Discharge: 2022-08-05 | Discharge status: Against Medical Advice | Payer: Medicare PPO | Attending: Emergency Medicine | Admitting: Emergency Medicine

## 2022-08-05 ENCOUNTER — Other Ambulatory Visit: Payer: Self-pay

## 2022-08-05 ENCOUNTER — Emergency Department (HOSPITAL_BASED_OUTPATIENT_CLINIC_OR_DEPARTMENT_OTHER): Payer: Medicare PPO | Admitting: Radiology

## 2022-08-05 DIAGNOSIS — R0609 Other forms of dyspnea: Secondary | ICD-10-CM | POA: Diagnosis not present

## 2022-08-05 DIAGNOSIS — Z5321 Procedure and treatment not carried out due to patient leaving prior to being seen by health care provider: Secondary | ICD-10-CM | POA: Insufficient documentation

## 2022-08-05 DIAGNOSIS — K5900 Constipation, unspecified: Secondary | ICD-10-CM | POA: Insufficient documentation

## 2022-08-05 DIAGNOSIS — Z859 Personal history of malignant neoplasm, unspecified: Secondary | ICD-10-CM | POA: Insufficient documentation

## 2022-08-05 DIAGNOSIS — M7989 Other specified soft tissue disorders: Secondary | ICD-10-CM | POA: Insufficient documentation

## 2022-08-05 LAB — CBC WITH DIFFERENTIAL/PLATELET
Abs Immature Granulocytes: 0.03 10*3/uL (ref 0.00–0.07)
Basophils Absolute: 0.1 10*3/uL (ref 0.0–0.1)
Basophils Relative: 1 %
Eosinophils Absolute: 0.4 10*3/uL (ref 0.0–0.5)
Eosinophils Relative: 5 %
HCT: 32.1 % — ABNORMAL LOW (ref 36.0–46.0)
Hemoglobin: 10 g/dL — ABNORMAL LOW (ref 12.0–15.0)
Immature Granulocytes: 0 %
Lymphocytes Relative: 17 %
Lymphs Abs: 1.4 10*3/uL (ref 0.7–4.0)
MCH: 26 pg (ref 26.0–34.0)
MCHC: 31.2 g/dL (ref 30.0–36.0)
MCV: 83.4 fL (ref 80.0–100.0)
Monocytes Absolute: 0.6 10*3/uL (ref 0.1–1.0)
Monocytes Relative: 7 %
Neutro Abs: 5.9 10*3/uL (ref 1.7–7.7)
Neutrophils Relative %: 70 %
Platelets: 373 10*3/uL (ref 150–400)
RBC: 3.85 MIL/uL — ABNORMAL LOW (ref 3.87–5.11)
RDW: 16.6 % — ABNORMAL HIGH (ref 11.5–15.5)
WBC: 8.5 10*3/uL (ref 4.0–10.5)
nRBC: 0 % (ref 0.0–0.2)

## 2022-08-05 LAB — COMPREHENSIVE METABOLIC PANEL
ALT: 15 U/L (ref 0–44)
AST: 27 U/L (ref 15–41)
Albumin: 3.5 g/dL (ref 3.5–5.0)
Alkaline Phosphatase: 71 U/L (ref 38–126)
Anion gap: 10 (ref 5–15)
BUN: 17 mg/dL (ref 8–23)
CO2: 28 mmol/L (ref 22–32)
Calcium: 9.1 mg/dL (ref 8.9–10.3)
Chloride: 101 mmol/L (ref 98–111)
Creatinine, Ser: 0.89 mg/dL (ref 0.44–1.00)
GFR, Estimated: >60 mL/min (ref >60)
Glucose, Bld: 101 mg/dL — ABNORMAL HIGH (ref 70–99)
Potassium: 4.4 mmol/L (ref 3.5–5.1)
Sodium: 139 mmol/L (ref 135–145)
Total Bilirubin: 0.4 mg/dL (ref 0.3–1.2)
Total Protein: 7.3 g/dL (ref 6.5–8.1)

## 2022-08-05 NOTE — ED Triage Notes (Signed)
Bilateral leg swelling x 3 days. Reports she recently had a recurrence of cancer and has been on multiple medications. Also reports constipation and dyspnea on exertion.

## 2022-08-08 ENCOUNTER — Other Ambulatory Visit: Payer: Self-pay | Admitting: Oncology

## 2022-08-09 ENCOUNTER — Ambulatory Visit (INDEPENDENT_AMBULATORY_CARE_PROVIDER_SITE_OTHER): Payer: Medicare PPO

## 2022-08-09 ENCOUNTER — Inpatient Hospital Stay: Payer: Medicare PPO

## 2022-08-09 VITALS — BP 137/75 | HR 65 | Temp 98.1°F | Resp 18 | Ht 60.0 in | Wt 119.0 lb

## 2022-08-09 VITALS — BP 133/72

## 2022-08-09 DIAGNOSIS — Z95828 Presence of other vascular implants and grafts: Secondary | ICD-10-CM

## 2022-08-09 DIAGNOSIS — C787 Secondary malignant neoplasm of liver and intrahepatic bile duct: Secondary | ICD-10-CM | POA: Diagnosis not present

## 2022-08-09 DIAGNOSIS — R109 Unspecified abdominal pain: Secondary | ICD-10-CM | POA: Diagnosis not present

## 2022-08-09 DIAGNOSIS — C189 Malignant neoplasm of colon, unspecified: Secondary | ICD-10-CM

## 2022-08-09 DIAGNOSIS — Z5112 Encounter for antineoplastic immunotherapy: Secondary | ICD-10-CM | POA: Diagnosis not present

## 2022-08-09 DIAGNOSIS — I639 Cerebral infarction, unspecified: Secondary | ICD-10-CM | POA: Diagnosis not present

## 2022-08-09 DIAGNOSIS — C7951 Secondary malignant neoplasm of bone: Secondary | ICD-10-CM | POA: Diagnosis not present

## 2022-08-09 DIAGNOSIS — Z5111 Encounter for antineoplastic chemotherapy: Secondary | ICD-10-CM | POA: Diagnosis not present

## 2022-08-09 DIAGNOSIS — Z452 Encounter for adjustment and management of vascular access device: Secondary | ICD-10-CM | POA: Diagnosis not present

## 2022-08-09 DIAGNOSIS — C7800 Secondary malignant neoplasm of unspecified lung: Secondary | ICD-10-CM | POA: Diagnosis not present

## 2022-08-09 DIAGNOSIS — C180 Malignant neoplasm of cecum: Secondary | ICD-10-CM | POA: Diagnosis not present

## 2022-08-09 DIAGNOSIS — R634 Abnormal weight loss: Secondary | ICD-10-CM | POA: Diagnosis not present

## 2022-08-09 LAB — CMP (CANCER CENTER ONLY)
ALT: 18 U/L (ref 0–44)
AST: 37 U/L (ref 15–41)
Albumin: 3.4 g/dL — ABNORMAL LOW (ref 3.5–5.0)
Alkaline Phosphatase: 67 U/L (ref 38–126)
Anion gap: 9 (ref 5–15)
BUN: 16 mg/dL (ref 8–23)
CO2: 29 mmol/L (ref 22–32)
Calcium: 9.2 mg/dL (ref 8.9–10.3)
Chloride: 103 mmol/L (ref 98–111)
Creatinine: 0.82 mg/dL (ref 0.44–1.00)
GFR, Estimated: >60 mL/min (ref >60)
Glucose, Bld: 85 mg/dL (ref 70–99)
Potassium: 4.4 mmol/L (ref 3.5–5.1)
Sodium: 141 mmol/L (ref 135–145)
Total Bilirubin: 0.5 mg/dL (ref 0.3–1.2)
Total Protein: 6.9 g/dL (ref 6.5–8.1)

## 2022-08-09 LAB — CBC WITH DIFFERENTIAL (CANCER CENTER ONLY)
Abs Immature Granulocytes: 0.03 10*3/uL (ref 0.00–0.07)
Basophils Absolute: 0 10*3/uL (ref 0.0–0.1)
Basophils Relative: 0 %
Eosinophils Absolute: 0.3 10*3/uL (ref 0.0–0.5)
Eosinophils Relative: 4 %
HCT: 30.4 % — ABNORMAL LOW (ref 36.0–46.0)
Hemoglobin: 9.5 g/dL — ABNORMAL LOW (ref 12.0–15.0)
Immature Granulocytes: 0 %
Lymphocytes Relative: 14 %
Lymphs Abs: 1.2 10*3/uL (ref 0.7–4.0)
MCH: 25.8 pg — ABNORMAL LOW (ref 26.0–34.0)
MCHC: 31.3 g/dL (ref 30.0–36.0)
MCV: 82.6 fL (ref 80.0–100.0)
Monocytes Absolute: 0.6 10*3/uL (ref 0.1–1.0)
Monocytes Relative: 7 %
Neutro Abs: 6.5 10*3/uL (ref 1.7–7.7)
Neutrophils Relative %: 75 %
Platelet Count: 385 10*3/uL (ref 150–400)
RBC: 3.68 MIL/uL — ABNORMAL LOW (ref 3.87–5.11)
RDW: 16.7 % — ABNORMAL HIGH (ref 11.5–15.5)
WBC Count: 8.7 10*3/uL (ref 4.0–10.5)
nRBC: 0 % (ref 0.0–0.2)

## 2022-08-09 LAB — TOTAL PROTEIN, URINE DIPSTICK: Protein, ur: NEGATIVE mg/dL

## 2022-08-09 LAB — CEA (ACCESS): CEA (CHCC): 11.88 ng/mL — ABNORMAL HIGH (ref 0.00–5.00)

## 2022-08-09 LAB — CUP PACEART REMOTE DEVICE CHECK
Date Time Interrogation Session: 20231225230701
Implantable Pulse Generator Implant Date: 20230807

## 2022-08-09 MED ORDER — SODIUM CHLORIDE 0.9 % IV SOLN
5.0000 mg/kg | Freq: Once | INTRAVENOUS | Status: AC
  Administered 2022-08-09: 300 mg via INTRAVENOUS
  Filled 2022-08-09: qty 12

## 2022-08-09 MED ORDER — FLUOROURACIL CHEMO INJECTION 500 MG/10ML
200.0000 mg/m2 | Freq: Once | INTRAVENOUS | Status: AC
  Administered 2022-08-09: 300 mg via INTRAVENOUS
  Filled 2022-08-09: qty 6

## 2022-08-09 MED ORDER — SODIUM CHLORIDE 0.9 % IV SOLN: Freq: Once | INTRAVENOUS | Status: AC

## 2022-08-09 MED ORDER — SODIUM CHLORIDE 0.9 % IV SOLN
200.0000 mg/m2 | Freq: Once | INTRAVENOUS | Status: AC
  Administered 2022-08-09: 312 mg via INTRAVENOUS
  Filled 2022-08-09: qty 15.6

## 2022-08-09 MED ORDER — SODIUM CHLORIDE 0.9% FLUSH
10.0000 mL | INTRAVENOUS | Status: DC | PRN
  Administered 2022-08-09: 10 mL via INTRAVENOUS

## 2022-08-09 MED ORDER — SODIUM CHLORIDE 0.9 % IV SOLN
100.0000 mg/m2 | Freq: Once | INTRAVENOUS | Status: AC
  Administered 2022-08-09: 160 mg via INTRAVENOUS
  Filled 2022-08-09: qty 8

## 2022-08-09 MED ORDER — SODIUM CHLORIDE 0.9 % IV SOLN
1600.0000 mg/m2 | INTRAVENOUS | Status: DC
  Administered 2022-08-09: 2500 mg via INTRAVENOUS
  Filled 2022-08-09: qty 50

## 2022-08-09 MED ORDER — PALONOSETRON HCL INJECTION 0.25 MG/5ML
0.2500 mg | Freq: Once | INTRAVENOUS | Status: AC
  Administered 2022-08-09: 0.25 mg via INTRAVENOUS
  Filled 2022-08-09: qty 5

## 2022-08-09 MED ORDER — ATROPINE SULFATE 1 MG/ML IV SOLN
0.5000 mg | Freq: Once | INTRAVENOUS | Status: AC | PRN
  Administered 2022-08-09: 0.5 mg via INTRAVENOUS
  Filled 2022-08-09: qty 1

## 2022-08-09 MED ORDER — SODIUM CHLORIDE 0.9 % IV SOLN
10.0000 mg | Freq: Once | INTRAVENOUS | Status: AC
  Administered 2022-08-09: 10 mg via INTRAVENOUS
  Filled 2022-08-09: qty 10

## 2022-08-09 NOTE — Patient Instructions (Signed)
[facility]  Discharge Instructions: Thank you for choosing [facility] to provide your oncology and hematology care.   If you have a lab appointment with the [facility], please go directly to the [facility] and check in at the registration area.   Wear comfortable clothing and clothing appropriate for easy access to any Portacath or PICC line.   We strive to give you quality time with your provider. You may need to reschedule your appointment if you arrive late (15 or more minutes).  Arriving late affects you and other patients whose appointments are after yours.  Also, if you miss three or more appointments without notifying the office, you may be dismissed from the clinic at the provider's discretion.      For prescription refill requests, have your pharmacy contact our office and allow 72 hours for refills to be completed.    Today you received the following chemotherapy and/or immunotherapy agents Avastin, Irinotecan, Leucovorin and Adrucil/5FU      To help prevent nausea and vomiting after your treatment, we encourage you to take your nausea medication as directed.  BELOW ARE SYMPTOMS THAT SHOULD BE REPORTED IMMEDIATELY: *FEVER GREATER THAN 100.4 F (38 C) OR HIGHER *CHILLS OR SWEATING *NAUSEA AND VOMITING THAT IS NOT CONTROLLED WITH YOUR NAUSEA MEDICATION *UNUSUAL SHORTNESS OF BREATH *UNUSUAL BRUISING OR BLEEDING *URINARY PROBLEMS (pain or burning when urinating, or frequent urination) *BOWEL PROBLEMS (unusual diarrhea, constipation, pain near the anus) TENDERNESS IN MOUTH AND THROAT WITH OR WITHOUT PRESENCE OF ULCERS (sore throat, sores in mouth, or a toothache) UNUSUAL RASH, SWELLING OR PAIN  UNUSUAL VAGINAL DISCHARGE OR ITCHING   Items with * indicate a potential emergency and should be followed up as soon as possible or go to the Emergency Department if any problems should occur.  Please show the CHEMOTHERAPY ALERT CARD or  IMMUNOTHERAPY ALERT CARD at check-in to the Emergency Department and triage nurse.  Should you have questions after your visit or need to cancel or reschedule your appointment, please contact [facility]  Dept: [phone]  and follow the prompts.  Office hours are 8:00 a.m. to 4:30 p.m. Monday - Friday. Please note that voicemails left after 4:00 p.m. may not be returned until the following business day.  We are closed weekends and major holidays. You have access to a nurse at all times for urgent questions. Please call the main number to the clinic Dept: [phone] and follow the prompts.   For any non-urgent questions, you may also contact your provider using MyChart. We now offer e-Visits for anyone 38 and older to request care online for non-urgent symptoms. For details visit [email or web address].   Also download the MyChart app! Go to the app store, search "MyChart", open the app, select [facility], and log in with your MyChart username and password.  Masks are optional in the cancer centers. If you would like for your care team to wear a mask while they are taking care of you, please let them know. You may have one support person who is at least 84 years old accompany you for your appointments.

## 2022-08-09 NOTE — Progress Notes (Addendum)
Patient is restarting chemotherapy.  MD increasing Irinotecan slightly from '80mg'$ /m2 to 100 mg/m2. Include 5FU bolus as well.  Raul Del Willcox, [place], BCPS, BCOP 08/09/2022 11:31 AM

## 2022-08-11 ENCOUNTER — Inpatient Hospital Stay: Payer: Medicare PPO

## 2022-08-11 VITALS — BP 160/78 | HR 58 | Temp 97.5°F | Resp 18

## 2022-08-11 DIAGNOSIS — Z5111 Encounter for antineoplastic chemotherapy: Secondary | ICD-10-CM | POA: Diagnosis not present

## 2022-08-11 DIAGNOSIS — R634 Abnormal weight loss: Secondary | ICD-10-CM | POA: Diagnosis not present

## 2022-08-11 DIAGNOSIS — Z5112 Encounter for antineoplastic immunotherapy: Secondary | ICD-10-CM | POA: Diagnosis not present

## 2022-08-11 DIAGNOSIS — C787 Secondary malignant neoplasm of liver and intrahepatic bile duct: Secondary | ICD-10-CM | POA: Diagnosis not present

## 2022-08-11 DIAGNOSIS — C180 Malignant neoplasm of cecum: Secondary | ICD-10-CM | POA: Diagnosis not present

## 2022-08-11 DIAGNOSIS — R109 Unspecified abdominal pain: Secondary | ICD-10-CM | POA: Diagnosis not present

## 2022-08-11 DIAGNOSIS — C7951 Secondary malignant neoplasm of bone: Secondary | ICD-10-CM | POA: Diagnosis not present

## 2022-08-11 DIAGNOSIS — C7800 Secondary malignant neoplasm of unspecified lung: Secondary | ICD-10-CM | POA: Diagnosis not present

## 2022-08-11 DIAGNOSIS — C189 Malignant neoplasm of colon, unspecified: Secondary | ICD-10-CM

## 2022-08-11 DIAGNOSIS — Z452 Encounter for adjustment and management of vascular access device: Secondary | ICD-10-CM | POA: Diagnosis not present

## 2022-08-11 MED ORDER — SODIUM CHLORIDE 0.9% FLUSH
10.0000 mL | INTRAVENOUS | Status: DC | PRN
  Administered 2022-08-11: 10 mL

## 2022-08-11 MED ORDER — HEPARIN SOD (PORK) LOCK FLUSH 100 UNIT/ML IV SOLN
500.0000 [IU] | Freq: Once | INTRAVENOUS | Status: AC | PRN
  Administered 2022-08-11: 500 [IU]

## 2022-08-12 NOTE — Progress Notes (Signed)
[facility] Summary Report / Loop Recorder 

## 2022-08-14 ENCOUNTER — Other Ambulatory Visit: Payer: Self-pay | Admitting: Neurology

## 2022-08-21 ENCOUNTER — Other Ambulatory Visit: Payer: Self-pay | Admitting: Oncology

## 2022-08-22 ENCOUNTER — Inpatient Hospital Stay: Payer: Medicare PPO

## 2022-08-22 ENCOUNTER — Inpatient Hospital Stay: Payer: Medicare PPO | Attending: Oncology | Admitting: Nurse Practitioner

## 2022-08-22 ENCOUNTER — Encounter: Payer: Self-pay | Admitting: Nurse Practitioner

## 2022-08-22 VITALS — BP 135/77 | HR 67 | Temp 98.1°F | Resp 20 | Ht 60.0 in | Wt 115.2 lb

## 2022-08-22 VITALS — BP 159/85 | HR 62 | Temp 98.1°F | Resp 18

## 2022-08-22 DIAGNOSIS — C189 Malignant neoplasm of colon, unspecified: Secondary | ICD-10-CM

## 2022-08-22 DIAGNOSIS — Z5112 Encounter for antineoplastic immunotherapy: Secondary | ICD-10-CM | POA: Insufficient documentation

## 2022-08-22 DIAGNOSIS — C787 Secondary malignant neoplasm of liver and intrahepatic bile duct: Secondary | ICD-10-CM

## 2022-08-22 DIAGNOSIS — Z5111 Encounter for antineoplastic chemotherapy: Secondary | ICD-10-CM | POA: Insufficient documentation

## 2022-08-22 DIAGNOSIS — C7800 Secondary malignant neoplasm of unspecified lung: Secondary | ICD-10-CM | POA: Insufficient documentation

## 2022-08-22 DIAGNOSIS — Z452 Encounter for adjustment and management of vascular access device: Secondary | ICD-10-CM | POA: Insufficient documentation

## 2022-08-22 DIAGNOSIS — C180 Malignant neoplasm of cecum: Secondary | ICD-10-CM | POA: Insufficient documentation

## 2022-08-22 LAB — CBC WITH DIFFERENTIAL (CANCER CENTER ONLY)
Abs Immature Granulocytes: 0.01 K/uL (ref 0.00–0.07)
Basophils Absolute: 0 K/uL (ref 0.0–0.1)
Basophils Relative: 0 %
Eosinophils Absolute: 0.4 K/uL (ref 0.0–0.5)
Eosinophils Relative: 8 %
HCT: 31 % — ABNORMAL LOW (ref 36.0–46.0)
Hemoglobin: 9.7 g/dL — ABNORMAL LOW (ref 12.0–15.0)
Immature Granulocytes: 0 %
Lymphocytes Relative: 21 %
Lymphs Abs: 1.1 K/uL (ref 0.7–4.0)
MCH: 26.1 pg (ref 26.0–34.0)
MCHC: 31.3 g/dL (ref 30.0–36.0)
MCV: 83.6 fL (ref 80.0–100.0)
Monocytes Absolute: 0.3 K/uL (ref 0.1–1.0)
Monocytes Relative: 7 %
Neutro Abs: 3.1 K/uL (ref 1.7–7.7)
Neutrophils Relative %: 64 %
Platelet Count: 250 K/uL (ref 150–400)
RBC: 3.71 MIL/uL — ABNORMAL LOW (ref 3.87–5.11)
RDW: 17.2 % — ABNORMAL HIGH (ref 11.5–15.5)
WBC Count: 4.9 K/uL (ref 4.0–10.5)
nRBC: 0 % (ref 0.0–0.2)

## 2022-08-22 LAB — CMP (CANCER CENTER ONLY)
ALT: 11 U/L (ref 0–44)
AST: 23 U/L (ref 15–41)
Albumin: 3.3 g/dL — ABNORMAL LOW (ref 3.5–5.0)
Alkaline Phosphatase: 67 U/L (ref 38–126)
Anion gap: 8 (ref 5–15)
BUN: 13 mg/dL (ref 8–23)
CO2: 28 mmol/L (ref 22–32)
Calcium: 8.8 mg/dL — ABNORMAL LOW (ref 8.9–10.3)
Chloride: 103 mmol/L (ref 98–111)
Creatinine: 0.77 mg/dL (ref 0.44–1.00)
GFR, Estimated: >60 mL/min (ref >60)
Glucose, Bld: 86 mg/dL (ref 70–99)
Potassium: 3.9 mmol/L (ref 3.5–5.1)
Sodium: 139 mmol/L (ref 135–145)
Total Bilirubin: 0.6 mg/dL (ref 0.3–1.2)
Total Protein: 6.4 g/dL — ABNORMAL LOW (ref 6.5–8.1)

## 2022-08-22 MED ORDER — SODIUM CHLORIDE 0.9% FLUSH
10.0000 mL | Freq: Once | INTRAVENOUS | Status: AC
  Administered 2022-08-22: 10 mL via INTRAVENOUS

## 2022-08-22 MED ORDER — HEPARIN SOD (PORK) LOCK FLUSH 100 UNIT/ML IV SOLN
500.0000 [IU] | Freq: Once | INTRAVENOUS | Status: AC
  Administered 2022-08-22: 500 [IU] via INTRAVENOUS

## 2022-08-22 MED ORDER — SODIUM CHLORIDE 0.9 % IV SOLN: INTRAVENOUS | Status: AC

## 2022-08-22 MED ORDER — ONDANSETRON HCL 8 MG PO TABS: 8.0000 mg | ORAL_TABLET | Freq: Three times a day (TID) | ORAL | 1 refills | Status: AC | PRN

## 2022-08-22 NOTE — Progress Notes (Signed)
[facility] OFFICE PROGRESS NOTE   Diagnosis: Colon cancer  INTERVAL HISTORY:   Cynthia [name] returns as scheduled.  She completed cycle 1 FOLFIRI/Avastin 08/09/2022.  She developed dry heaves at the beginning of the day 3.  She then had nausea/vomiting which has been occurring intermittently since.  She developed diarrhea for 3 days beginning day 3.  Imodium relieved temporarily.  She noted lower extremity edema after treatment.  This has improved.  She feels weak.  Oral intake is poor, especially fluids.  No mouth sores.  No bleeding.  She is no longer having pain.  She attributes this to relief of constipation.  Objective:  Vital signs in last 24 hours:  Blood pressure 135/77, pulse 67, temperature 98.1 F (36.7 C), temperature source Oral, resp. rate 20, height 5' (1.524 m), weight 115 lb 3.2 oz (52.3 kg), SpO2 98 %.    HEENT: No thrush or ulcers. Resp: Lungs clear bilaterally. Cardio: Regular rate and rhythm. GI: Abdomen soft and nontender.  No hepatosplenomegaly. Vascular: Trace pitting edema lower leg bilaterally. Neuro: Alert and oriented. Skin: Skin turgor is intact.  Left great toe with faint erythema, nontender, no drainage.   Lab Results:  Lab Results  Component Value Date   WBC 4.9 08/22/2022   HGB 9.7 (L) 08/22/2022   HCT 31.0 (L) 08/22/2022   MCV 83.6 08/22/2022   PLT 250 08/22/2022   NEUTROABS 3.1 08/22/2022    Imaging:  No results found.  Medications: I have reviewed the patient's current medications.  Assessment/Plan: Moderately differentiated adenocarcinoma of the cecum, stage III (T4a, N1), status post a laparoscopic assisted right colectomy 10/22/2013 The tumor returned microsatellite stable with normal mismatch repair protein expression   Cycle 1 adjuvant Xeloda 12/25/2013.   Cycle 2 adjuvant Xeloda 01/15/2014.   Cycle 3 adjuvant Xeloda 02/05/2014.   Cycle 4 adjuvant Xeloda 03/18/2014, discontinued after 4 days secondary to  diarrhea   Cycle 5 adjuvant Xeloda 04/16/2014, Xeloda dose reduced to 1000 mg in the a.m. and 500 mg in the p.m.   Cycle 6 adjuvant Xeloda 05/07/2014. Cycle 7 adjuvant Xeloda 05/28/2014. Cycle 8 adjuvant Xeloda 06/18/2014. Restaging CTs on 07/14/2014 with no evidence of metastatic colon cancer Colonoscopy 10/02/2014 with diverticulosis in the sigmoid colon and in the descending colon. One 4 mm polyp in the sigmoid colon with complete resection. Polyp tissue not retrieved. Internal hemorrhoids. Next colonoscopy in 3 years for surveillance. Restaging CTs 07/16/2015 with 2 new hypodense liver lesions concerning for metastases, no other evidence of disease progression PET scan 07/28/2015 with a single hypermetabolic right liver lesion, no other evidence of metastatic disease Ultrasound-guided biopsy of the right liver lesion 07/30/2015 confirmed metastatic colon cancer, MSS, tumor mutation burden 1, KRAS G12D, PIK3CA, PTEN MRI abdomen 08/20/2015 with similar to slight decrease in size of 2 hepatic metastasis. No new liver lesions or extrahepatic metastatic disease identified. Radiofrequency ablation of 2 liver lesions 09/25/2015 MRI abdomen 02/02/2016-coagulative necrosis at the 2 ablated liver tumor sites, with no enhancement to suggest residual or recurrent liver tumor. No new liver masses or other findings of metastatic disease in the abdomen. MRI abdomen 08/10/2016 revealed stable ablation sites, no evidence of progressive metastatic disease Restaging CT 02/08/2017-stable treated liver lesions, no evidence of progressive metastatic disease, stable borderline enlarged right inguinal node  CEA with stable mild elevation 02/08/2017, 03/01/2017, 03/29/2017, 04/19/2017 CEA further elevated 08/21/2017 CTs 09/05/2017- possible local recurrence of tumor along the ablation site right hepatic lobe lesion.  Cephalad to the ablation  site approximately 1.9 x 2.1 cm soft tissue mass progressive compared to the prior  exam. Liver SBRT 10/17/2017, 10/19/2017, 10/23/2017, 10/25/2017, 10/27/2017 CT 02/19/2018- progressive ill-defined areas of low attenuation with enhancement surrounding the segment 6 ablation site, stable segment 8 ablation site enlargement of extracapsular lesion posteriorly, no new hepatic lesions, wall thickening/edema at the distal ileum CT 05/22/2018- 2 new lung nodules suspicious for metastases, stable segment 8 ablation zone, slight decrease in the size of delayed perfusion at the segment 6 ablation zone, posterior extracapsular lesion is slightly smaller CT chest 07/13/2018- multiple lung nodules consistent with metastatic disease Cycle 1 Xeloda 07/23/2018 Cycle 2 Xeloda 08/13/2018 Cycle 3 Xeloda 09/03/2018 Cycle 4 Xeloda 09/24/2018 Restaging chest CT 10/12/2018- scattered pulmonary nodules/metastases mildly improved. Cycle 5 Xeloda 10/15/2018 Cycle 6 Xeloda 11/05/2018 Cycle 7 Xeloda 11/26/2018 Cycle 8 Xeloda 12/17/2018 Cycle 9 Xeloda 01/07/2019 CT chest 01/24/2019- stable pulmonary nodules.  No new pulmonary nodules.  Stable left hepatic lobe cysts and right hepatic lobe lesions.  No new hepatic lesions.  Stable right ninth and 10th rib lesions. Xeloda daily dosing schedule beginning 01/28/2019 due to hand-foot syndrome CT 05/27/2019- enlargement of lung metastases, similar right hepatic lobe lesion CT 08/05/2019-enlargement of bilateral lung nodules, no new lesions, stable right hepatic lesion Cycle 1 FOLFIRI 08/13/2019 Cycle 2 FOLFIRI 09/04/2019, Irinotecan dose reduced due to neutropenia Cycle 3 FOLFIRI 09/17/2019 Cycle 4 FOLFIRI 10/08/2019, 5-FU and irinotecan dose reduced CT chest 10/28/2019-bilateral pulmonary metastases with minimal improvement.  Stable ablation zone in the posterior right hepatic lobe. CT chest 03/03/2020-enlargement of pulmonary metastases, decreased right hepatic ablation site CT chest 07/07/2020-enlargement of pulmonary metastases, stable ablation site involving right hepatic  lobe Cycle 1 FOLFIRI/Avastin 07/27/2020 Cycle 2 FOLFIRI/Avastin 08/18/2020 Treatment held 09/08/2020 due to neutropenia Cycle 3 FOLFIRI/Avastin 09/17/2020 (irinotecan dose reduced due to neutropenia) Cycle 4 FOLFIRI/Avastin 10/06/2020 Chemotherapy discontinued per patient request CT chest 11/16/2020-decrease in size of dominant right perihilar nodule, other lung nodules are stable, stable right liver ablation site CTs 07/28/2022-increase size of numerous pulmonary metastases, increased number and size of numerous bone lesions; mixed lucency and sclerosis throughout the T9 vertebral body, mild compression and approximately 20% loss of central vertebral body height most compatible with a metastatic lesion with pathologic compression fracture; expansile sclerotic lesions noted in the lateral aspect of the right ninth and 10th ribs as well as the anterolateral aspect of the left sixth rib which also has surrounding soft tissue component. Cycle 1 FOLFIRI/Avastin 08/09/2022 Right ovary cystadenofibroma, status post a bilateral oophorectomy 10/22/2013 Remote history of cervical cancer. Iron deficiency anemia-resolved. Right calf deep vein thrombosis 10/24/2013-she completed 3 months of xarelto. Indeterminate 12 mm hypermetabolic right pelvic density on the staging PET scan 10/04/2013-CT followup recommended per the GI tumor conference 11/13/2013. No longer visualized on a CT 07/14/2014. Soft tissue implant overlying the posterior right liver on the CT 24/40/1027-OZD hypermetabolic on the PET scan 66/44/0347. C. difficile colitis 03/05/2014. She completed a course of Flagyl, recurrent diarrhea beginning 03/21/2014, positive C. difficile toxin 03/26/2014-resumed Flagyl for planned 10 day course. Vancomycin beginning 04/08/2014. Taper complete 05/21/2014.   9.  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  Xeloda dose/schedule adjusted beginning 01/28/2019. 10.  CVA-left centrum semiovale  and left caudate 12/14/2021 11.  03/21/2022-implantation of loop recorder  Disposition: Ms. Trieu has metastatic colon cancer.  She completed cycle 1 FOLFIRI/Avastin 08/09/2022.  She had delayed nausea and diarrhea.  Symptoms only improved recently.  We decided to hold treatment today and reschedule for 1  week.  We discussed adding Emend and prophylactic dexamethasone.  She is unsure if she will proceed with additional chemotherapy.  We will discuss further at her visit next week.  Prescription for Zofran 8 mg every 8 hours as needed sent to her pharmacy.  She is likely dehydrated.  She will receive a liter of IV fluids today.  She will return for lab, follow-up, possible chemotherapy in 1 week.  We are available to see her sooner if needed.    Ned Card ANP/GNP-BC   08/22/2022  11:39 AM

## 2022-08-22 NOTE — Patient Instructions (Signed)

## 2022-08-24 ENCOUNTER — Inpatient Hospital Stay: Payer: Medicare PPO

## 2022-08-29 ENCOUNTER — Encounter: Payer: Self-pay | Admitting: Nurse Practitioner

## 2022-08-29 ENCOUNTER — Inpatient Hospital Stay: Payer: Medicare PPO

## 2022-08-29 ENCOUNTER — Inpatient Hospital Stay: Payer: Medicare PPO | Admitting: Nurse Practitioner

## 2022-08-29 VITALS — BP 155/70 | HR 70 | Temp 98.6°F | Resp 19

## 2022-08-29 VITALS — BP 134/79 | HR 63 | Temp 98.1°F | Resp 18 | Ht 60.0 in | Wt 116.6 lb

## 2022-08-29 DIAGNOSIS — C189 Malignant neoplasm of colon, unspecified: Secondary | ICD-10-CM

## 2022-08-29 DIAGNOSIS — Z5111 Encounter for antineoplastic chemotherapy: Secondary | ICD-10-CM | POA: Diagnosis not present

## 2022-08-29 DIAGNOSIS — Z5112 Encounter for antineoplastic immunotherapy: Secondary | ICD-10-CM | POA: Diagnosis not present

## 2022-08-29 DIAGNOSIS — Z452 Encounter for adjustment and management of vascular access device: Secondary | ICD-10-CM | POA: Diagnosis not present

## 2022-08-29 DIAGNOSIS — C180 Malignant neoplasm of cecum: Secondary | ICD-10-CM | POA: Diagnosis not present

## 2022-08-29 DIAGNOSIS — C787 Secondary malignant neoplasm of liver and intrahepatic bile duct: Secondary | ICD-10-CM

## 2022-08-29 DIAGNOSIS — C7800 Secondary malignant neoplasm of unspecified lung: Secondary | ICD-10-CM | POA: Diagnosis not present

## 2022-08-29 LAB — CMP (CANCER CENTER ONLY)
ALT: 17 U/L (ref 0–44)
AST: 30 U/L (ref 15–41)
Albumin: 3.5 g/dL (ref 3.5–5.0)
Alkaline Phosphatase: 76 U/L (ref 38–126)
Anion gap: 9 (ref 5–15)
BUN: 14 mg/dL (ref 8–23)
CO2: 28 mmol/L (ref 22–32)
Calcium: 9.2 mg/dL (ref 8.9–10.3)
Chloride: 103 mmol/L (ref 98–111)
Creatinine: 0.86 mg/dL (ref 0.44–1.00)
GFR, Estimated: >60 mL/min (ref >60)
Glucose, Bld: 79 mg/dL (ref 70–99)
Potassium: 4.3 mmol/L (ref 3.5–5.1)
Sodium: 140 mmol/L (ref 135–145)
Total Bilirubin: 0.5 mg/dL (ref 0.3–1.2)
Total Protein: 6.5 g/dL (ref 6.5–8.1)

## 2022-08-29 LAB — CBC WITH DIFFERENTIAL (CANCER CENTER ONLY)
Abs Immature Granulocytes: 0.01 10*3/uL (ref 0.00–0.07)
Basophils Absolute: 0.1 10*3/uL (ref 0.0–0.1)
Basophils Relative: 1 %
Eosinophils Absolute: 0.5 10*3/uL (ref 0.0–0.5)
Eosinophils Relative: 11 %
HCT: 32.3 % — ABNORMAL LOW (ref 36.0–46.0)
Hemoglobin: 10.1 g/dL — ABNORMAL LOW (ref 12.0–15.0)
Immature Granulocytes: 0 %
Lymphocytes Relative: 28 %
Lymphs Abs: 1.3 10*3/uL (ref 0.7–4.0)
MCH: 26 pg (ref 26.0–34.0)
MCHC: 31.3 g/dL (ref 30.0–36.0)
MCV: 83.2 fL (ref 80.0–100.0)
Monocytes Absolute: 0.5 10*3/uL (ref 0.1–1.0)
Monocytes Relative: 12 %
Neutro Abs: 2.2 10*3/uL (ref 1.7–7.7)
Neutrophils Relative %: 48 %
Platelet Count: 293 10*3/uL (ref 150–400)
RBC: 3.88 MIL/uL (ref 3.87–5.11)
RDW: 18.8 % — ABNORMAL HIGH (ref 11.5–15.5)
WBC Count: 4.6 10*3/uL (ref 4.0–10.5)
nRBC: 0 % (ref 0.0–0.2)

## 2022-08-29 LAB — CEA (ACCESS): CEA (CHCC): 27.74 ng/mL — ABNORMAL HIGH (ref 0.00–5.00)

## 2022-08-29 MED ORDER — ATROPINE SULFATE 1 MG/ML IV SOLN
0.5000 mg | Freq: Once | INTRAVENOUS | Status: AC | PRN
  Administered 2022-08-29: 0.5 mg via INTRAVENOUS
  Filled 2022-08-29: qty 1

## 2022-08-29 MED ORDER — HEPARIN SOD (PORK) LOCK FLUSH 100 UNIT/ML IV SOLN: 500.0000 [IU] | Freq: Once | INTRAVENOUS | Status: DC | PRN

## 2022-08-29 MED ORDER — GABAPENTIN 400 MG PO CAPS: ORAL_CAPSULE | ORAL | 5 refills | Status: DC

## 2022-08-29 MED ORDER — SODIUM CHLORIDE 0.9 % IV SOLN
100.0000 mg/m2 | Freq: Once | INTRAVENOUS | Status: AC
  Administered 2022-08-29: 160 mg via INTRAVENOUS
  Filled 2022-08-29: qty 8

## 2022-08-29 MED ORDER — SODIUM CHLORIDE 0.9 % IV SOLN
150.0000 mg | Freq: Once | INTRAVENOUS | Status: AC
  Administered 2022-08-29: 150 mg via INTRAVENOUS
  Filled 2022-08-29: qty 150

## 2022-08-29 MED ORDER — SODIUM CHLORIDE 0.9% FLUSH: 10.0000 mL | INTRAVENOUS | Status: DC | PRN

## 2022-08-29 MED ORDER — SODIUM CHLORIDE 0.9 % IV SOLN
10.0000 mg | Freq: Once | INTRAVENOUS | Status: AC
  Administered 2022-08-29: 10 mg via INTRAVENOUS
  Filled 2022-08-29: qty 10

## 2022-08-29 MED ORDER — SODIUM CHLORIDE 0.9 % IV SOLN
1600.0000 mg/m2 | INTRAVENOUS | Status: DC
  Administered 2022-08-29: 2500 mg via INTRAVENOUS
  Filled 2022-08-29: qty 50

## 2022-08-29 MED ORDER — FLUOROURACIL CHEMO INJECTION 500 MG/10ML
200.0000 mg/m2 | Freq: Once | INTRAVENOUS | Status: AC
  Administered 2022-08-29: 300 mg via INTRAVENOUS
  Filled 2022-08-29: qty 6

## 2022-08-29 MED ORDER — DEXAMETHASONE 4 MG PO TABS: 4.0000 mg | ORAL_TABLET | Freq: Two times a day (BID) | ORAL | 0 refills | Status: DC

## 2022-08-29 MED ORDER — SODIUM CHLORIDE 0.9 % IV SOLN: Freq: Once | INTRAVENOUS | Status: AC

## 2022-08-29 MED ORDER — SODIUM CHLORIDE 0.9 % IV SOLN
200.0000 mg/m2 | Freq: Once | INTRAVENOUS | Status: AC
  Administered 2022-08-29: 312 mg via INTRAVENOUS
  Filled 2022-08-29: qty 15.6

## 2022-08-29 MED ORDER — PALONOSETRON HCL INJECTION 0.25 MG/5ML
0.2500 mg | Freq: Once | INTRAVENOUS | Status: AC
  Administered 2022-08-29: 0.25 mg via INTRAVENOUS
  Filled 2022-08-29: qty 5

## 2022-08-29 MED ORDER — SODIUM CHLORIDE 0.9 % IV SOLN
275.0000 mg | Freq: Once | INTRAVENOUS | Status: AC
  Administered 2022-08-29: 275 mg via INTRAVENOUS
  Filled 2022-08-29: qty 11

## 2022-08-29 NOTE — Patient Instructions (Addendum)
[place]  The chemotherapy medication bag should finish at 46 hours, 96 hours, or 7 days. For example, if your pump is scheduled for 46 hours and it was put on at 4:00 p.m., it should finish at 2:00 p.m. the day it is scheduled to come off regardless of your appointment time.     Estimated time to finish at 1:15  Wednesday 08/31/22.   If the display on your pump reads "Low Volume" and it is beeping, take the batteries out of the pump and come to the cancer center for it to be taken off.   If the pump alarms go off prior to the pump reading "Low Volume" then call [phone] and someone can assist you.  If the plunger comes out and the chemotherapy medication is leaking out, please use your home chemo spill kit to clean up the spill. Do NOT use paper towels or other household products.  If you have problems or questions regarding your pump, please call either [phone] (24 hours a day) or the cancer center Monday-Friday 8:00 a.m.- 4:30 p.m. at the clinic number and we will assist you. If you are unable to get assistance, then go to the nearest Emergency Department and ask the staff to contact the IV team for assistance.   Discharge Instructions: Thank you for choosing [facility] to provide your oncology and hematology care.   If you have a lab appointment with the [facility], please go directly to the [facility] and check in at the registration area.   Wear comfortable clothing and clothing appropriate for easy access to any Portacath or PICC line.   We strive to give you quality time with your provider. You may need to reschedule your appointment if you arrive late (15 or more minutes).  Arriving late affects you and other patients whose appointments are after yours.  Also, if you miss three or more appointments without notifying the office, you may be dismissed from the clinic at the provider's discretion.      For prescription refill  requests, have your pharmacy contact our office and allow 72 hours for refills to be completed.    Today you received the following chemotherapy and/or immunotherapy agents Avastin, Irinotecan, Leucovorin, Fluorouracil.      To help prevent nausea and vomiting after your treatment, we encourage you to take your nausea medication as directed.  BELOW ARE SYMPTOMS THAT SHOULD BE REPORTED IMMEDIATELY: *FEVER GREATER THAN 100.4 F (38 C) OR HIGHER *CHILLS OR SWEATING *NAUSEA AND VOMITING THAT IS NOT CONTROLLED WITH YOUR NAUSEA MEDICATION *UNUSUAL SHORTNESS OF BREATH *UNUSUAL BRUISING OR BLEEDING *URINARY PROBLEMS (pain or burning when urinating, or frequent urination) *BOWEL PROBLEMS (unusual diarrhea, constipation, pain near the anus) TENDERNESS IN MOUTH AND THROAT WITH OR WITHOUT PRESENCE OF ULCERS (sore throat, sores in mouth, or a toothache) UNUSUAL RASH, SWELLING OR PAIN  UNUSUAL VAGINAL DISCHARGE OR ITCHING   Items with * indicate a potential emergency and should be followed up as soon as possible or go to the Emergency Department if any problems should occur.  Please show the CHEMOTHERAPY ALERT CARD or IMMUNOTHERAPY ALERT CARD at check-in to the Emergency Department and triage nurse.  Should you have questions after your visit or need to cancel or reschedule your appointment, please contact [facility]  Dept: [phone]  and follow the prompts.  Office hours are 8:00 a.m. to 4:30 p.m. Monday - Friday. Please note that voicemails  left after 4:00 p.m. may not be returned until the following business day.  We are closed weekends and major holidays. You have access to a nurse at all times for urgent questions. Please call the main number to the clinic Dept: [phone] and follow the prompts.   For any non-urgent questions, you may also contact your provider using MyChart. We now offer e-Visits for anyone 54 and older to request care online for non-urgent  symptoms. For details visit [email or web address].   Also download the MyChart app! Go to the app store, search "MyChart", open the app, select [facility], and log in with your MyChart username and password.  Bevacizumab Injection What is this medication? BEVACIZUMAB (be va SIZ yoo mab) treats some types of cancer. It works by blocking a protein that causes cancer cells to grow and multiply. This helps to slow or stop the spread of cancer cells. It is a monoclonal antibody. This medicine may be used for other purposes; ask your health care provider or pharmacist if you have questions. COMMON BRAND NAME(S): Alymsys, Avastin, MVASI, Noah Charon What should I tell my care team before I take this medication? They need to know if you have any of these conditions: Blood clots Coughing up blood Having or recent surgery Heart failure High blood pressure History of a connection between 2 or more body parts that do not usually connect (fistula) History of a tear in your stomach or intestines Protein in your urine An unusual or allergic reaction to bevacizumab, other medications, foods, dyes, or preservatives Pregnant or trying to get pregnant Breast-feeding How should I use this medication? This medication is injected into a vein. It is given by your care team in a hospital or clinic setting. Talk to your care team the use of this medication in children. Special care may be needed. Overdosage: If you think you have taken too much of this medicine contact a poison control center or emergency room at once. NOTE: This medicine is only for you. Do not share this medicine with others. What if I miss a dose? Keep appointments for follow-up doses. It is important not to miss your dose. Call your care team if you are unable to keep an appointment. What may interact with this medication? Interactions are not expected. This list may not describe all possible interactions. Give your health care provider a  list of all the medicines, herbs, non-prescription drugs, or dietary supplements you use. Also tell them if you smoke, drink alcohol, or use illegal drugs. Some items may interact with your medicine. What should I watch for while using this medication? Your condition will be monitored carefully while you are receiving this medication. You may need blood work while taking this medication. This medication may make you feel generally unwell. This is not uncommon as chemotherapy can affect healthy cells as well as cancer cells. Report any side effects. Continue your course of treatment even though you feel ill unless your care team tells you to stop. This medication may increase your risk to bruise or bleed. Call your care team if you notice any unusual bleeding. Before having surgery, talk to your care team to make sure it is ok. This medication can increase the risk of poor healing of your surgical site or wound. You will need to stop this medication for 28 days before surgery. After surgery, wait at least 28 days before restarting this medication. Make sure the surgical site or wound is healed enough before restarting this medication. Talk  to your care team if questions. Talk to your care team if you may be pregnant. Serious birth defects can occur if you take this medication during pregnancy and for 6 months after the last dose. Contraception is recommended while taking this medication and for 6 months after the last dose. Your care team can help you find the option that works for you. Do not breastfeed while taking this medication and for 6 months after the last dose. This medication can cause infertility. Talk to your care team if you are concerned about your fertility. What side effects may I notice from receiving this medication? Side effects that you should report to your care team as soon as possible: Allergic reactions--skin rash, itching, hives, swelling of the face, lips, tongue, or  throat Bleeding--bloody or black, tar-like stools, vomiting blood or brown material that looks like coffee grounds, red or dark brown urine, small red or purple spots on skin, unusual bruising or bleeding Blood clot--pain, swelling, or warmth in the leg, shortness of breath, chest pain Heart attack--pain or tightness in the chest, shoulders, arms, or jaw, nausea, shortness of breath, cold or clammy skin, feeling faint or lightheaded Heart failure--shortness of breath, swelling of the ankles, feet, or hands, sudden weight gain, unusual weakness or fatigue Increase in blood pressure Infection--fever, chills, cough, sore throat, wounds that don't heal, pain or trouble when passing urine, general feeling of discomfort or being unwell Infusion reactions--chest pain, shortness of breath or trouble breathing, feeling faint or lightheaded Kidney injury--decrease in the amount of urine, swelling of the ankles, hands, or feet Stomach pain that is severe, does not go away, or gets worse Stroke--sudden numbness or weakness of the face, arm, or leg, trouble speaking, confusion, trouble walking, loss of balance or coordination, dizziness, severe headache, change in vision Sudden and severe headache, confusion, change in vision, seizures, which may be signs of posterior reversible encephalopathy syndrome (PRES) Side effects that usually do not require medical attention (report to your care team if they continue or are bothersome): Back pain Change in taste Diarrhea Dry skin Increased tears Nosebleed This list may not describe all possible side effects. Call your doctor for medical advice about side effects. You may report side effects to FDA at 1-800-FDA-1088. Where should I keep my medication? This medication is given in a hospital or clinic. It will not be stored at home. NOTE: This sheet is a summary. It may not cover all possible information. If you have questions about this medicine, talk to your doctor,  pharmacist, or health care provider.  2023 Elsevier/Gold Standard (2021-12-03 00:00:00) Irinotecan Injection What is this medication? IRINOTECAN (ir in oh TEE kan) treats some types of cancer. It works by slowing down the growth of cancer cells. This medicine may be used for other purposes; ask your health care provider or pharmacist if you have questions. COMMON BRAND NAME(S): Camptosar What should I tell my care team before I take this medication? They need to know if you have any of these conditions: Dehydration Diarrhea Infection, especially a viral infection, such as chickenpox, cold sores, herpes Liver disease Low blood cell levels (white cells, red cells, and platelets) Low levels of electrolytes, such as calcium, magnesium, or potassium in your blood Recent or ongoing radiation An unusual or allergic reaction to irinotecan, other medications, foods, dyes, or preservatives If you or your partner are pregnant or trying to get pregnant Breast-feeding How should I use this medication? This medication is injected into a vein. It  is given by your care team in a hospital or clinic setting. Talk to your care team about the use of this medication in children. Special care may be needed. Overdosage: If you think you have taken too much of this medicine contact a poison control center or emergency room at once. NOTE: This medicine is only for you. Do not share this medicine with others. What if I miss a dose? Keep appointments for follow-up doses. It is important not to miss your dose. Call your care team if you are unable to keep an appointment. What may interact with this medication? Do not take this medication with any of the following: Cobicistat Itraconazole This medication may also interact with the following: Certain antibiotics, such as clarithromycin, rifampin, rifabutin Certain antivirals for HIV or AIDS Certain medications for fungal infections, such as ketoconazole,  posaconazole, voriconazole Certain medications for seizures, such as carbamazepine, phenobarbital, phenytoin Gemfibrozil Nefazodone St. John's wort This list may not describe all possible interactions. Give your health care provider a list of all the medicines, herbs, non-prescription drugs, or dietary supplements you use. Also tell them if you smoke, drink alcohol, or use illegal drugs. Some items may interact with your medicine. What should I watch for while using this medication? Your condition will be monitored carefully while you are receiving this medication. You may need blood work while taking this medication. This medication may make you feel generally unwell. This is not uncommon as chemotherapy can affect healthy cells as well as cancer cells. Report any side effects. Continue your course of treatment even though you feel ill unless your care team tells you to stop. This medication can cause serious side effects. To reduce the risk, your care team may give you other medications to take before receiving this one. Be sure to follow the directions from your care team. This medication may affect your coordination, reaction time, or judgement. Do not drive or operate machinery until you know how this medication affects you. Sit up or stand slowly to reduce the risk of dizzy or fainting spells. Drinking alcohol with this medication can increase the risk of these side effects. This medication may increase your risk of getting an infection. Call your care team for advice if you get a fever, chills, sore throat, or other symptoms of a cold or flu. Do not treat yourself. Try to avoid being around people who are sick. Avoid taking medications that contain aspirin, acetaminophen, ibuprofen, naproxen, or ketoprofen unless instructed by your care team. These medications may hide a fever. This medication may increase your risk to bruise or bleed. Call your care team if you notice any unusual bleeding. Be  careful brushing or flossing your teeth or using a toothpick because you may get an infection or bleed more easily. If you have any dental work done, tell your dentist you are receiving this medication. Talk to your care team if you or your partner are pregnant or think either of you might be pregnant. This medication can cause serious birth defects if taken during pregnancy and for 6 months after the last dose. You will need a negative pregnancy test before starting this medication. Contraception is recommended while taking this medication and for 6 months after the last dose. Your care team can help you find the option that works for you. Do not father a child while taking this medication and for 3 months after the last dose. Use a condom for contraception during this time period. Do not breastfeed  while taking this medication and for 7 days after the last dose. This medication may cause infertility. Talk to your care team if you are concerned about your fertility. What side effects may I notice from receiving this medication? Side effects that you should report to your care team as soon as possible: Allergic reactions--skin rash, itching, hives, swelling of the face, lips, tongue, or throat Dry cough, shortness of breath or trouble breathing Increased saliva or tears, increased sweating, stomach cramping, diarrhea, small pupils, unusual weakness or fatigue, slow heartbeat Infection--fever, chills, cough, sore throat, wounds that don't heal, pain or trouble when passing urine, general feeling of discomfort or being unwell Kidney injury--decrease in the amount of urine, swelling of the ankles, hands, or feet Low red blood cell level--unusual weakness or fatigue, dizziness, headache, trouble breathing Severe or prolonged diarrhea Unusual bruising or bleeding Side effects that usually do not require medical attention (report to your care team if they continue or are  bothersome): Constipation Diarrhea Hair loss Loss of appetite Nausea Stomach pain This list may not describe all possible side effects. Call your doctor for medical advice about side effects. You may report side effects to FDA at 1-800-FDA-1088. Where should I keep my medication? This medication is given in a hospital or clinic. It will not be stored at home. NOTE: This sheet is a summary. It may not cover all possible information. If you have questions about this medicine, talk to your doctor, pharmacist, or health care provider.  2023 Elsevier/Gold Standard (2021-12-09 00:00:00) Leucovorin Injection What is this medication? LEUCOVORIN (loo koe VOR in) prevents side effects from certain medications, such as methotrexate. It works by increasing folate levels. This helps protect healthy cells in your body. It may also be used to treat anemia caused by low levels of folate. It can also be used with fluorouracil, a type of chemotherapy, to treat colorectal cancer. It works by increasing the effects of fluorouracil in the body. This medicine may be used for other purposes; ask your health care provider or pharmacist if you have questions. What should I tell my care team before I take this medication? They need to know if you have any of these conditions: Anemia from low levels of vitamin B12 in the blood An unusual or allergic reaction to leucovorin, folic acid, other medications, foods, dyes, or preservatives Pregnant or trying to get pregnant Breastfeeding How should I use this medication? This medication is injected into a vein or a muscle. It is given by your care team in a hospital or clinic setting. Talk to your care team about the use of this medication in children. Special care may be needed. Overdosage: If you think you have taken too much of this medicine contact a poison control center or emergency room at once. NOTE: This medicine is only for you. Do not share this medicine with  others. What if I miss a dose? Keep appointments for follow-up doses. It is important not to miss your dose. Call your care team if you are unable to keep an appointment. What may interact with this medication? Capecitabine Fluorouracil Phenobarbital Phenytoin Primidone Trimethoprim;sulfamethoxazole This list may not describe all possible interactions. Give your health care provider a list of all the medicines, herbs, non-prescription drugs, or dietary supplements you use. Also tell them if you smoke, drink alcohol, or use illegal drugs. Some items may interact with your medicine. What should I watch for while using this medication? Your condition will be monitored carefully while you  are receiving this medication. This medication may increase the side effects of 5-fluorouracil. Tell your care team if you have diarrhea or mouth sores that do not get better or that get worse. What side effects may I notice from receiving this medication? Side effects that you should report to your care team as soon as possible: Allergic reactions--skin rash, itching, hives, swelling of the face, lips, tongue, or throat This list may not describe all possible side effects. Call your doctor for medical advice about side effects. You may report side effects to FDA at 1-800-FDA-1088. Where should I keep my medication? This medication is given in a hospital or clinic. It will not be stored at home. NOTE: This sheet is a summary. It may not cover all possible information. If you have questions about this medicine, talk to your doctor, pharmacist, or health care provider.  2023 Elsevier/Gold Standard (2022-01-04 00:00:00) Fluorouracil Injection What is this medication? FLUOROURACIL (flure oh YOOR a sil) treats some types of cancer. It works by slowing down the growth of cancer cells. This medicine may be used for other purposes; ask your health care provider or pharmacist if you have questions. COMMON BRAND  NAME(S): Adrucil What should I tell my care team before I take this medication? They need to know if you have any of these conditions: Blood disorders Dihydropyrimidine dehydrogenase (DPD) deficiency Infection, such as chickenpox, cold sores, herpes Kidney disease Liver disease Poor nutrition Recent or ongoing radiation therapy An unusual or allergic reaction to fluorouracil, other medications, foods, dyes, or preservatives If you or your partner are pregnant or trying to get pregnant Breast-feeding How should I use this medication? This medication is injected into a vein. It is administered by your care team in a hospital or clinic setting. Talk to your care team about the use of this medication in children. Special care may be needed. Overdosage: If you think you have taken too much of this medicine contact a poison control center or emergency room at once. NOTE: This medicine is only for you. Do not share this medicine with others. What if I miss a dose? Keep appointments for follow-up doses. It is important not to miss your dose. Call your care team if you are unable to keep an appointment. What may interact with this medication? Do not take this medication with any of the following: Live virus vaccines This medication may also interact with the following: Medications that treat or prevent blood clots, such as warfarin, enoxaparin, dalteparin This list may not describe all possible interactions. Give your health care provider a list of all the medicines, herbs, non-prescription drugs, or dietary supplements you use. Also tell them if you smoke, drink alcohol, or use illegal drugs. Some items may interact with your medicine. What should I watch for while using this medication? Your condition will be monitored carefully while you are receiving this medication. This medication may make you feel generally unwell. This is not uncommon as chemotherapy can affect healthy cells as well as  cancer cells. Report any side effects. Continue your course of treatment even though you feel ill unless your care team tells you to stop. In some cases, you may be given additional medications to help with side effects. Follow all directions for their use. This medication may increase your risk of getting an infection. Call your care team for advice if you get a fever, chills, sore throat, or other symptoms of a cold or flu. Do not treat yourself. Try to avoid  being around people who are sick. This medication may increase your risk to bruise or bleed. Call your care team if you notice any unusual bleeding. Be careful brushing or flossing your teeth or using a toothpick because you may get an infection or bleed more easily. If you have any dental work done, tell your dentist you are receiving this medication. Avoid taking medications that contain aspirin, acetaminophen, ibuprofen, naproxen, or ketoprofen unless instructed by your care team. These medications may hide a fever. Do not treat diarrhea with over the counter products. Contact your care team if you have diarrhea that lasts more than 2 days or if it is severe and watery. This medication can make you more sensitive to the sun. Keep out of the sun. If you cannot avoid being in the sun, wear protective clothing and sunscreen. Do not use sun lamps, tanning beds, or tanning booths. Talk to your care team if you or your partner wish to become pregnant or think you might be pregnant. This medication can cause serious birth defects if taken during pregnancy and for 3 months after the last dose. A reliable form of contraception is recommended while taking this medication and for 3 months after the last dose. Talk to your care team about effective forms of contraception. Do not father a child while taking this medication and for 3 months after the last dose. Use a condom while having sex during this time period. Do not breastfeed while taking this  medication. This medication may cause infertility. Talk to your care team if you are concerned about your fertility. What side effects may I notice from receiving this medication? Side effects that you should report to your care team as soon as possible: Allergic reactions--skin rash, itching, hives, swelling of the face, lips, tongue, or throat Heart attack--pain or tightness in the chest, shoulders, arms, or jaw, nausea, shortness of breath, cold or clammy skin, feeling faint or lightheaded Heart failure--shortness of breath, swelling of the ankles, feet, or hands, sudden weight gain, unusual weakness or fatigue Heart rhythm changes--fast or irregular heartbeat, dizziness, feeling faint or lightheaded, chest pain, trouble breathing High ammonia level--unusual weakness or fatigue, confusion, loss of appetite, nausea, vomiting, seizures Infection--fever, chills, cough, sore throat, wounds that don't heal, pain or trouble when passing urine, general feeling of discomfort or being unwell Low red blood cell level--unusual weakness or fatigue, dizziness, headache, trouble breathing Pain, tingling, or numbness in the hands or feet, muscle weakness, change in vision, confusion or trouble speaking, loss of balance or coordination, trouble walking, seizures Redness, swelling, and blistering of the skin over hands and feet Severe or prolonged diarrhea Unusual bruising or bleeding Side effects that usually do not require medical attention (report to your care team if they continue or are bothersome): Dry skin Headache Increased tears Nausea Pain, redness, or swelling with sores inside the mouth or throat Sensitivity to light Vomiting This list may not describe all possible side effects. Call your doctor for medical advice about side effects. You may report side effects to FDA at 1-800-FDA-1088. Where should I keep my medication? This medication is given in a hospital or clinic. It will not be stored  at home. NOTE: This sheet is a summary. It may not cover all possible information. If you have questions about this medicine, talk to your doctor, pharmacist, or health care provider.  2023 Elsevier/Gold Standard (2021-11-30 00:00:00)

## 2022-08-29 NOTE — Progress Notes (Signed)
[facility] OFFICE PROGRESS NOTE   Diagnosis: Colon cancer  INTERVAL HISTORY:   Cynthia [name] returns as scheduled.  Cycle 2 FOLFIRI/Avastin was held 08/22/2022 due to poor tolerance with nausea and diarrhea.  She received IV fluids.  She reports feeling better soon after the IV fluids.  No further nausea, vomiting, diarrhea.  She notes lower extremity swelling intermittently.  The swelling improves with elevation.  She has not tried support stockings.  She has intermittent discomfort at the right lateral chest.  Objective:  Vital signs in last 24 hours:  Blood pressure 134/79, pulse 63, temperature 98.1 F (36.7 C), temperature source Oral, resp. rate 18, height 5' (1.524 m), weight 116 lb 9.6 oz (52.9 kg), SpO2 96 %.    HEENT: No thrush or ulcers. Resp: Lungs clear bilaterally. Cardio: Regular rate and rhythm. GI: No hepatomegaly. Vascular: Trace edema lower leg bilaterally.  Calves soft and nontender. Skin: Stable skin thickening right lateral chest wall.  No erythema. Port-A-Cath without erythema.  Lab Results:  Lab Results  Component Value Date   WBC 4.6 08/29/2022   HGB 10.1 (L) 08/29/2022   HCT 32.3 (L) 08/29/2022   MCV 83.2 08/29/2022   PLT 293 08/29/2022   NEUTROABS 2.2 08/29/2022    Imaging:  No results found.  Medications: I have reviewed the patient's current medications.  Assessment/Plan: Moderately differentiated adenocarcinoma of the cecum, stage III (T4a, N1), status post a laparoscopic assisted right colectomy 10/22/2013 The tumor returned microsatellite stable with normal mismatch repair protein expression   Cycle 1 adjuvant Xeloda 12/25/2013.   Cycle 2 adjuvant Xeloda 01/15/2014.   Cycle 3 adjuvant Xeloda 02/05/2014.   Cycle 4 adjuvant Xeloda 03/18/2014, discontinued after 4 days secondary to diarrhea   Cycle 5 adjuvant Xeloda 04/16/2014, Xeloda dose reduced to 1000 mg in the a.m. and 500 mg in the p.m.   Cycle 6 adjuvant Xeloda  05/07/2014. Cycle 7 adjuvant Xeloda 05/28/2014. Cycle 8 adjuvant Xeloda 06/18/2014. Restaging CTs on 07/14/2014 with no evidence of metastatic colon cancer Colonoscopy 10/02/2014 with diverticulosis in the sigmoid colon and in the descending colon. One 4 mm polyp in the sigmoid colon with complete resection. Polyp tissue not retrieved. Internal hemorrhoids. Next colonoscopy in 3 years for surveillance. Restaging CTs 07/16/2015 with 2 new hypodense liver lesions concerning for metastases, no other evidence of disease progression PET scan 07/28/2015 with a single hypermetabolic right liver lesion, no other evidence of metastatic disease Ultrasound-guided biopsy of the right liver lesion 07/30/2015 confirmed metastatic colon cancer, MSS, tumor mutation burden 1, KRAS G12D, PIK3CA, PTEN MRI abdomen 08/20/2015 with similar to slight decrease in size of 2 hepatic metastasis. No new liver lesions or extrahepatic metastatic disease identified. Radiofrequency ablation of 2 liver lesions 09/25/2015 MRI abdomen 02/02/2016-coagulative necrosis at the 2 ablated liver tumor sites, with no enhancement to suggest residual or recurrent liver tumor. No new liver masses or other findings of metastatic disease in the abdomen. MRI abdomen 08/10/2016 revealed stable ablation sites, no evidence of progressive metastatic disease Restaging CT 02/08/2017-stable treated liver lesions, no evidence of progressive metastatic disease, stable borderline enlarged right inguinal node  CEA with stable mild elevation 02/08/2017, 03/01/2017, 03/29/2017, 04/19/2017 CEA further elevated 08/21/2017 CTs 09/05/2017- possible local recurrence of tumor along the ablation site right hepatic lobe lesion.  Cephalad to the ablation site approximately 1.9 x 2.1 cm soft tissue mass progressive compared to the prior exam. Liver SBRT 10/17/2017, 10/19/2017, 10/23/2017, 10/25/2017, 10/27/2017 CT 02/19/2018- progressive ill-defined areas of low attenuation  with  enhancement surrounding the segment 6 ablation site, stable segment 8 ablation site enlargement of extracapsular lesion posteriorly, no new hepatic lesions, wall thickening/edema at the distal ileum CT 05/22/2018- 2 new lung nodules suspicious for metastases, stable segment 8 ablation zone, slight decrease in the size of delayed perfusion at the segment 6 ablation zone, posterior extracapsular lesion is slightly smaller CT chest 07/13/2018- multiple lung nodules consistent with metastatic disease Cycle 1 Xeloda 07/23/2018 Cycle 2 Xeloda 08/13/2018 Cycle 3 Xeloda 09/03/2018 Cycle 4 Xeloda 09/24/2018 Restaging chest CT 10/12/2018- scattered pulmonary nodules/metastases mildly improved. Cycle 5 Xeloda 10/15/2018 Cycle 6 Xeloda 11/05/2018 Cycle 7 Xeloda 11/26/2018 Cycle 8 Xeloda 12/17/2018 Cycle 9 Xeloda 01/07/2019 CT chest 01/24/2019- stable pulmonary nodules.  No new pulmonary nodules.  Stable left hepatic lobe cysts and right hepatic lobe lesions.  No new hepatic lesions.  Stable right ninth and 10th rib lesions. Xeloda daily dosing schedule beginning 01/28/2019 due to hand-foot syndrome CT 05/27/2019- enlargement of lung metastases, similar right hepatic lobe lesion CT 08/05/2019-enlargement of bilateral lung nodules, no new lesions, stable right hepatic lesion Cycle 1 FOLFIRI 08/13/2019 Cycle 2 FOLFIRI 09/04/2019, Irinotecan dose reduced due to neutropenia Cycle 3 FOLFIRI 09/17/2019 Cycle 4 FOLFIRI 10/08/2019, 5-FU and irinotecan dose reduced CT chest 10/28/2019-bilateral pulmonary metastases with minimal improvement.  Stable ablation zone in the posterior right hepatic lobe. CT chest 03/03/2020-enlargement of pulmonary metastases, decreased right hepatic ablation site CT chest 07/07/2020-enlargement of pulmonary metastases, stable ablation site involving right hepatic lobe Cycle 1 FOLFIRI/Avastin 07/27/2020 Cycle 2 FOLFIRI/Avastin 08/18/2020 Treatment held 09/08/2020 due to neutropenia Cycle 3 FOLFIRI/Avastin  09/17/2020 (irinotecan dose reduced due to neutropenia) Cycle 4 FOLFIRI/Avastin 10/06/2020 Chemotherapy discontinued per patient request CT chest 11/16/2020-decrease in size of dominant right perihilar nodule, other lung nodules are stable, stable right liver ablation site CTs 07/28/2022-increase size of numerous pulmonary metastases, increased number and size of numerous bone lesions; mixed lucency and sclerosis throughout the T9 vertebral body, mild compression and approximately 20% loss of central vertebral body height most compatible with a metastatic lesion with pathologic compression fracture; expansile sclerotic lesions noted in the lateral aspect of the right ninth and 10th ribs as well as the anterolateral aspect of the left sixth rib which also has surrounding soft tissue component. Cycle 1 FOLFIRI/Avastin 08/09/2022 Cycle 2 FOLFIRI/Avastin 08/29/2022, Emend and prophylactic dexamethasone added Right ovary cystadenofibroma, status post a bilateral oophorectomy 10/22/2013 Remote history of cervical cancer. Iron deficiency anemia-resolved. Right calf deep vein thrombosis 10/24/2013-she completed 3 months of xarelto. Indeterminate 12 mm hypermetabolic right pelvic density on the staging PET scan 10/04/2013-CT followup recommended per the GI tumor conference 11/13/2013. No longer visualized on a CT 07/14/2014. Soft tissue implant overlying the posterior right liver on the CT 18/29/9371-IRC hypermetabolic on the PET scan 78/93/8101. C. difficile colitis 03/05/2014. She completed a course of Flagyl, recurrent diarrhea beginning 03/21/2014, positive C. difficile toxin 03/26/2014-resumed Flagyl for planned 10 day course. Vancomycin beginning 04/08/2014. Taper complete 05/21/2014.   9.  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  Xeloda dose/schedule adjusted beginning 01/28/2019. 10.  CVA-left centrum semiovale and left caudate 12/14/2021 11.  03/21/2022-implantation of loop  recorder  Disposition: Ms. Dohrmann appears stable.  She has completed 1 cycle of FOLFIRI/Avastin.  She had delayed nausea and mild diarrhea.  Cycle 2 was held last week.  She would like to proceed with treatment today as scheduled.  Emend has been added to the premedication regimen.  We discussed the possibility of an allergic reaction.  She will begin prophylactic dexamethasone 08/30/2022, 4 mg twice daily for 3 days.  We discussed potential side effects including insomnia, emotional lability, increased blood sugar.  We also discussed potential side effects if steroids are taken long-term.  She agrees to proceed.  Plan to proceed with cycle 2 FOLFIRI/Avastin today as scheduled.  CBC and chemistry panel reviewed.  Labs adequate to proceed as above.  We discussed the CEA is more elevated.  We will repeat in 2 weeks.  She will return for lab, follow-up, cycle 3 FOLFIRI/Avastin in 2 weeks.  We are available to see her sooner if needed.    Ned Card ANP/GNP-BC   08/29/2022  10:59 AM

## 2022-08-29 NOTE — Progress Notes (Signed)
Patient seen by Ned Card, NP  today  Vitals are within treatment parameters.   Labs reviewed by Ned Card, NP and are within treatment parameters. Per Ned Card, NP ok to proceed with treatment with pending urine protein.   Per physician team, patient is ready for treatment. Please note, Emend has been added to the premedications.

## 2022-08-29 NOTE — Progress Notes (Signed)
Decrease Mvasi to '275mg'$  per MD due to weight change.  Raul Del Hale, [place], BCPS, BCOP 08/29/2022 12:08 PM

## 2022-08-31 ENCOUNTER — Inpatient Hospital Stay: Payer: Medicare PPO

## 2022-08-31 VITALS — BP 160/77 | HR 60 | Temp 98.0°F | Resp 20

## 2022-08-31 DIAGNOSIS — Z5111 Encounter for antineoplastic chemotherapy: Secondary | ICD-10-CM | POA: Diagnosis not present

## 2022-08-31 DIAGNOSIS — Z452 Encounter for adjustment and management of vascular access device: Secondary | ICD-10-CM | POA: Diagnosis not present

## 2022-08-31 DIAGNOSIS — C189 Malignant neoplasm of colon, unspecified: Secondary | ICD-10-CM

## 2022-08-31 DIAGNOSIS — C7800 Secondary malignant neoplasm of unspecified lung: Secondary | ICD-10-CM | POA: Diagnosis not present

## 2022-08-31 DIAGNOSIS — C180 Malignant neoplasm of cecum: Secondary | ICD-10-CM | POA: Diagnosis not present

## 2022-08-31 DIAGNOSIS — Z5112 Encounter for antineoplastic immunotherapy: Secondary | ICD-10-CM | POA: Diagnosis not present

## 2022-08-31 MED ORDER — HEPARIN SOD (PORK) LOCK FLUSH 100 UNIT/ML IV SOLN
500.0000 [IU] | Freq: Once | INTRAVENOUS | Status: AC | PRN
  Administered 2022-08-31: 500 [IU]

## 2022-08-31 MED ORDER — SODIUM CHLORIDE 0.9% FLUSH
10.0000 mL | INTRAVENOUS | Status: DC | PRN
  Administered 2022-08-31: 10 mL

## 2022-08-31 NOTE — Patient Instructions (Signed)

## 2022-09-05 NOTE — Progress Notes (Signed)
[facility] Summary Report / Loop Recorder

## 2022-09-11 ENCOUNTER — Other Ambulatory Visit: Payer: Self-pay | Admitting: Oncology

## 2022-09-11 DIAGNOSIS — C787 Secondary malignant neoplasm of liver and intrahepatic bile duct: Secondary | ICD-10-CM

## 2022-09-12 ENCOUNTER — Ambulatory Visit: Payer: Medicare PPO

## 2022-09-12 DIAGNOSIS — I639 Cerebral infarction, unspecified: Secondary | ICD-10-CM | POA: Diagnosis not present

## 2022-09-13 ENCOUNTER — Inpatient Hospital Stay: Payer: Medicare PPO

## 2022-09-13 ENCOUNTER — Inpatient Hospital Stay: Payer: Medicare PPO | Admitting: Oncology

## 2022-09-13 ENCOUNTER — Telehealth: Payer: Self-pay | Admitting: *Deleted

## 2022-09-13 VITALS — BP 155/81 | HR 59 | Temp 97.9°F | Resp 18 | Ht 60.0 in | Wt 119.0 lb

## 2022-09-13 DIAGNOSIS — C787 Secondary malignant neoplasm of liver and intrahepatic bile duct: Secondary | ICD-10-CM

## 2022-09-13 DIAGNOSIS — Z5111 Encounter for antineoplastic chemotherapy: Secondary | ICD-10-CM | POA: Diagnosis not present

## 2022-09-13 DIAGNOSIS — Z95828 Presence of other vascular implants and grafts: Secondary | ICD-10-CM | POA: Diagnosis not present

## 2022-09-13 DIAGNOSIS — C180 Malignant neoplasm of cecum: Secondary | ICD-10-CM | POA: Diagnosis not present

## 2022-09-13 DIAGNOSIS — C189 Malignant neoplasm of colon, unspecified: Secondary | ICD-10-CM

## 2022-09-13 DIAGNOSIS — Z5112 Encounter for antineoplastic immunotherapy: Secondary | ICD-10-CM | POA: Diagnosis not present

## 2022-09-13 DIAGNOSIS — C7800 Secondary malignant neoplasm of unspecified lung: Secondary | ICD-10-CM | POA: Diagnosis not present

## 2022-09-13 DIAGNOSIS — Z452 Encounter for adjustment and management of vascular access device: Secondary | ICD-10-CM | POA: Diagnosis not present

## 2022-09-13 LAB — CMP (CANCER CENTER ONLY)
ALT: 38 U/L (ref 0–44)
AST: 36 U/L (ref 15–41)
Albumin: 3.4 g/dL — ABNORMAL LOW (ref 3.5–5.0)
Alkaline Phosphatase: 56 U/L (ref 38–126)
Anion gap: 6 (ref 5–15)
BUN: 21 mg/dL (ref 8–23)
CO2: 28 mmol/L (ref 22–32)
Calcium: 8.8 mg/dL — ABNORMAL LOW (ref 8.9–10.3)
Chloride: 105 mmol/L (ref 98–111)
Creatinine: 0.84 mg/dL (ref 0.44–1.00)
GFR, Estimated: >60 mL/min (ref >60)
Glucose, Bld: 73 mg/dL (ref 70–99)
Potassium: 3.8 mmol/L (ref 3.5–5.1)
Sodium: 139 mmol/L (ref 135–145)
Total Bilirubin: 0.3 mg/dL (ref 0.3–1.2)
Total Protein: 6.2 g/dL — ABNORMAL LOW (ref 6.5–8.1)

## 2022-09-13 LAB — CBC WITH DIFFERENTIAL (CANCER CENTER ONLY)
Abs Immature Granulocytes: 0.01 10*3/uL (ref 0.00–0.07)
Basophils Absolute: 0 10*3/uL (ref 0.0–0.1)
Basophils Relative: 0 %
Eosinophils Absolute: 0.2 10*3/uL (ref 0.0–0.5)
Eosinophils Relative: 5 %
HCT: 31.9 % — ABNORMAL LOW (ref 36.0–46.0)
Hemoglobin: 10.2 g/dL — ABNORMAL LOW (ref 12.0–15.0)
Immature Granulocytes: 0 %
Lymphocytes Relative: 59 %
Lymphs Abs: 2.3 10*3/uL (ref 0.7–4.0)
MCH: 27.1 pg (ref 26.0–34.0)
MCHC: 32 g/dL (ref 30.0–36.0)
MCV: 84.6 fL (ref 80.0–100.0)
Monocytes Absolute: 0.6 10*3/uL (ref 0.1–1.0)
Monocytes Relative: 16 %
Neutro Abs: 0.8 10*3/uL — ABNORMAL LOW (ref 1.7–7.7)
Neutrophils Relative %: 20 %
Platelet Count: 256 10*3/uL (ref 150–400)
RBC: 3.77 MIL/uL — ABNORMAL LOW (ref 3.87–5.11)
RDW: 20.3 % — ABNORMAL HIGH (ref 11.5–15.5)
WBC Count: 4 10*3/uL (ref 4.0–10.5)
nRBC: 0 % (ref 0.0–0.2)

## 2022-09-13 LAB — CEA (ACCESS): CEA (CHCC): 12.51 ng/mL — ABNORMAL HIGH (ref 0.00–5.00)

## 2022-09-13 MED ORDER — SODIUM CHLORIDE 0.9% FLUSH
10.0000 mL | INTRAVENOUS | Status: DC | PRN
  Administered 2022-09-13: 10 mL

## 2022-09-13 MED ORDER — HEPARIN SOD (PORK) LOCK FLUSH 100 UNIT/ML IV SOLN
500.0000 [IU] | Freq: Once | INTRAVENOUS | Status: AC | PRN
  Administered 2022-09-13: 500 [IU]

## 2022-09-13 NOTE — Progress Notes (Signed)
[facility] OFFICE PROGRESS NOTE   Diagnosis: Colon cancer  INTERVAL HISTORY:   Cynthia [name] completed another cycle of FOLFIRI/bevacizumab on 08/29/2022.  She did not have nausea following this cycle of chemotherapy.  She did not have significant diarrhea.  She complains of increased malaise.  Her mouth feels "raw ".  No discrete ulcer.  Objective:  Vital signs in last 24 hours:  Blood pressure (!) 155/81, pulse (!) 59, temperature 97.9 F (36.6 C), temperature source Oral, resp. rate 18, height 5' (1.524 m), weight 119 lb (54 kg), SpO2 100 %.    HEENT: No thrush or ulcers Resp: Lungs clear bilaterally Cardio: Regular rate and rhythm GI: No hepatosplenomegaly, nontender Vascular: No leg edema   Portacath/PICC-without erythema  Lab Results:  Lab Results  Component Value Date   WBC 4.0 09/13/2022   HGB 10.2 (L) 09/13/2022   HCT 31.9 (L) 09/13/2022   MCV 84.6 09/13/2022   PLT 256 09/13/2022   NEUTROABS 0.8 (L) 09/13/2022    CMP  Lab Results  Component Value Date   NA 140 08/29/2022   K 4.3 08/29/2022   CL 103 08/29/2022   CO2 28 08/29/2022   GLUCOSE 79 08/29/2022   BUN 14 08/29/2022   CREATININE 0.86 08/29/2022   CALCIUM 9.2 08/29/2022   PROT 6.5 08/29/2022   ALBUMIN 3.5 08/29/2022   AST 30 08/29/2022   ALT 17 08/29/2022   ALKPHOS 76 08/29/2022   BILITOT 0.5 08/29/2022   GFRNONAA >60 08/29/2022   GFRAA >60 10/08/2019    Lab Results  Component Value Date   CEA1 4.27 05/24/2021   CEA 27.74 (H) 08/29/2022    Lab Results  Component Value Date   INR 1.1 12/14/2021   LABPROT 13.9 12/14/2021    Imaging:  No results found.  Medications: I have reviewed the patient's current medications.   Assessment/Plan: Moderately differentiated adenocarcinoma of the cecum, stage III (T4a, N1), status post a laparoscopic assisted right colectomy 10/22/2013 The tumor returned microsatellite stable with normal mismatch repair protein expression    Cycle 1 adjuvant Xeloda 12/25/2013.   Cycle 2 adjuvant Xeloda 01/15/2014.   Cycle 3 adjuvant Xeloda 02/05/2014.   Cycle 4 adjuvant Xeloda 03/18/2014, discontinued after 4 days secondary to diarrhea   Cycle 5 adjuvant Xeloda 04/16/2014, Xeloda dose reduced to 1000 mg in the a.m. and 500 mg in the p.m.   Cycle 6 adjuvant Xeloda 05/07/2014. Cycle 7 adjuvant Xeloda 05/28/2014. Cycle 8 adjuvant Xeloda 06/18/2014. Restaging CTs on 07/14/2014 with no evidence of metastatic colon cancer Colonoscopy 10/02/2014 with diverticulosis in the sigmoid colon and in the descending colon. One 4 mm polyp in the sigmoid colon with complete resection. Polyp tissue not retrieved. Internal hemorrhoids. Next colonoscopy in 3 years for surveillance. Restaging CTs 07/16/2015 with 2 new hypodense liver lesions concerning for metastases, no other evidence of disease progression PET scan 07/28/2015 with a single hypermetabolic right liver lesion, no other evidence of metastatic disease Ultrasound-guided biopsy of the right liver lesion 07/30/2015 confirmed metastatic colon cancer, MSS, tumor mutation burden 1, KRAS G12D, PIK3CA, PTEN MRI abdomen 08/20/2015 with similar to slight decrease in size of 2 hepatic metastasis. No new liver lesions or extrahepatic metastatic disease identified. Radiofrequency ablation of 2 liver lesions 09/25/2015 MRI abdomen 02/02/2016-coagulative necrosis at the 2 ablated liver tumor sites, with no enhancement to suggest residual or recurrent liver tumor. No new liver masses or other findings of metastatic disease in the abdomen. MRI abdomen 08/10/2016 revealed stable ablation sites,  no evidence of progressive metastatic disease Restaging CT 02/08/2017-stable treated liver lesions, no evidence of progressive metastatic disease, stable borderline enlarged right inguinal node  CEA with stable mild elevation 02/08/2017, 03/01/2017, 03/29/2017, 04/19/2017 CEA further elevated 08/21/2017 CTs 09/05/2017-  possible local recurrence of tumor along the ablation site right hepatic lobe lesion.  Cephalad to the ablation site approximately 1.9 x 2.1 cm soft tissue mass progressive compared to the prior exam. Liver SBRT 10/17/2017, 10/19/2017, 10/23/2017, 10/25/2017, 10/27/2017 CT 02/19/2018- progressive ill-defined areas of low attenuation with enhancement surrounding the segment 6 ablation site, stable segment 8 ablation site enlargement of extracapsular lesion posteriorly, no new hepatic lesions, wall thickening/edema at the distal ileum CT 05/22/2018- 2 new lung nodules suspicious for metastases, stable segment 8 ablation zone, slight decrease in the size of delayed perfusion at the segment 6 ablation zone, posterior extracapsular lesion is slightly smaller CT chest 07/13/2018- multiple lung nodules consistent with metastatic disease Cycle 1 Xeloda 07/23/2018 Cycle 2 Xeloda 08/13/2018 Cycle 3 Xeloda 09/03/2018 Cycle 4 Xeloda 09/24/2018 Restaging chest CT 10/12/2018- scattered pulmonary nodules/metastases mildly improved. Cycle 5 Xeloda 10/15/2018 Cycle 6 Xeloda 11/05/2018 Cycle 7 Xeloda 11/26/2018 Cycle 8 Xeloda 12/17/2018 Cycle 9 Xeloda 01/07/2019 CT chest 01/24/2019- stable pulmonary nodules.  No new pulmonary nodules.  Stable left hepatic lobe cysts and right hepatic lobe lesions.  No new hepatic lesions.  Stable right ninth and 10th rib lesions. Xeloda daily dosing schedule beginning 01/28/2019 due to hand-foot syndrome CT 05/27/2019- enlargement of lung metastases, similar right hepatic lobe lesion CT 08/05/2019-enlargement of bilateral lung nodules, no new lesions, stable right hepatic lesion Cycle 1 FOLFIRI 08/13/2019 Cycle 2 FOLFIRI 09/04/2019, Irinotecan dose reduced due to neutropenia Cycle 3 FOLFIRI 09/17/2019 Cycle 4 FOLFIRI 10/08/2019, 5-FU and irinotecan dose reduced CT chest 10/28/2019-bilateral pulmonary metastases with minimal improvement.  Stable ablation zone in the posterior right hepatic lobe. CT chest  03/03/2020-enlargement of pulmonary metastases, decreased right hepatic ablation site CT chest 07/07/2020-enlargement of pulmonary metastases, stable ablation site involving right hepatic lobe Cycle 1 FOLFIRI/Avastin 07/27/2020 Cycle 2 FOLFIRI/Avastin 08/18/2020 Treatment held 09/08/2020 due to neutropenia Cycle 3 FOLFIRI/Avastin 09/17/2020 (irinotecan dose reduced due to neutropenia) Cycle 4 FOLFIRI/Avastin 10/06/2020 Chemotherapy discontinued per patient request CT chest 11/16/2020-decrease in size of dominant right perihilar nodule, other lung nodules are stable, stable right liver ablation site CTs 07/28/2022-increase size of numerous pulmonary metastases, increased number and size of numerous bone lesions; mixed lucency and sclerosis throughout the T9 vertebral body, mild compression and approximately 20% loss of central vertebral body height most compatible with a metastatic lesion with pathologic compression fracture; expansile sclerotic lesions noted in the lateral aspect of the right ninth and 10th ribs as well as the anterolateral aspect of the left sixth rib which also has surrounding soft tissue component. Cycle 1 FOLFIRI/Avastin 08/09/2022 Cycle 2 FOLFIRI/Avastin 08/29/2022, Emend and prophylactic dexamethasone added Right ovary cystadenofibroma, status post a bilateral oophorectomy 10/22/2013 Remote history of cervical cancer. Iron deficiency anemia-resolved. Right calf deep vein thrombosis 10/24/2013-she completed 3 months of xarelto. Indeterminate 12 mm hypermetabolic right pelvic density on the staging PET scan 10/04/2013-CT followup recommended per the GI tumor conference 11/13/2013. No longer visualized on a CT 07/14/2014. Soft tissue implant overlying the posterior right liver on the CT 47/82/9562-ZHY hypermetabolic on the PET scan 86/57/8469. C. difficile colitis 03/05/2014. She completed a course of Flagyl, recurrent diarrhea beginning 03/21/2014, positive C. difficile toxin  03/26/2014-resumed Flagyl for planned 10 day course. Vancomycin beginning 04/08/2014. Taper complete 05/21/2014.   9.  Hand-foot  syndrome (skin dryness and paronychia) secondary to Xeloda.  Xeloda dose/schedule adjusted beginning 01/28/2019. 10.  CVA-left centrum semiovale and left caudate 12/14/2021 11.  03/21/2022-implantation of loop recorder    Disposition: Ms. Kulesza has completed 2 cycles of FOLFIRI/bevacizumab.  The last cycle was better tolerated with the addition of Emend and Decadron prophylaxis.  She has neutropenia.  Chemotherapy will be held today.  She will return for the next cycle of chemotherapy in 1 week.  We will follow-up on the CEA from today. I recommend adding G-CSF support with the neck cycle of chemotherapy.  We reviewed potential toxicities associated with G-CSF including the chance of bone pain, rash, and splenic rupture.  She agrees to proceed.  [name], MD  09/13/2022  8:30 AM

## 2022-09-13 NOTE — Telephone Encounter (Signed)
Was able to inform Ms. Mallis that her CEA has decreased from 27 to 12.

## 2022-09-13 NOTE — Progress Notes (Signed)
Patient seen by Dr. Benay Spice today  Vitals are within treatment parameters.  Labs reviewed by Dr. Benay Spice and are not all within treatment parameters. ANC 0.8 K/uL  Per physician team, patient will not be receiving treatment today.

## 2022-09-14 LAB — CUP PACEART REMOTE DEVICE CHECK
Date Time Interrogation Session: 20240127230400
Implantable Pulse Generator Implant Date: 20230807

## 2022-09-15 ENCOUNTER — Inpatient Hospital Stay: Payer: Medicare PPO

## 2022-09-18 ENCOUNTER — Other Ambulatory Visit: Payer: Self-pay | Admitting: Oncology

## 2022-09-20 ENCOUNTER — Inpatient Hospital Stay: Payer: Medicare PPO | Admitting: Nurse Practitioner

## 2022-09-20 ENCOUNTER — Encounter: Payer: Self-pay | Admitting: Nurse Practitioner

## 2022-09-20 ENCOUNTER — Inpatient Hospital Stay: Payer: Medicare PPO

## 2022-09-20 ENCOUNTER — Inpatient Hospital Stay: Payer: Medicare PPO | Attending: Oncology

## 2022-09-20 VITALS — BP 128/74 | HR 81 | Temp 98.1°F | Resp 18 | Ht 60.0 in | Wt 115.0 lb

## 2022-09-20 DIAGNOSIS — C7801 Secondary malignant neoplasm of right lung: Secondary | ICD-10-CM | POA: Diagnosis not present

## 2022-09-20 DIAGNOSIS — C787 Secondary malignant neoplasm of liver and intrahepatic bile duct: Secondary | ICD-10-CM | POA: Diagnosis not present

## 2022-09-20 DIAGNOSIS — Z95828 Presence of other vascular implants and grafts: Secondary | ICD-10-CM | POA: Diagnosis not present

## 2022-09-20 DIAGNOSIS — R530 Neoplastic (malignant) related fatigue: Secondary | ICD-10-CM | POA: Insufficient documentation

## 2022-09-20 DIAGNOSIS — C189 Malignant neoplasm of colon, unspecified: Secondary | ICD-10-CM | POA: Diagnosis not present

## 2022-09-20 DIAGNOSIS — C180 Malignant neoplasm of cecum: Secondary | ICD-10-CM | POA: Insufficient documentation

## 2022-09-20 DIAGNOSIS — C7802 Secondary malignant neoplasm of left lung: Secondary | ICD-10-CM | POA: Diagnosis not present

## 2022-09-20 LAB — CBC WITH DIFFERENTIAL (CANCER CENTER ONLY)
Abs Immature Granulocytes: 0.03 10*3/uL (ref 0.00–0.07)
Basophils Absolute: 0.1 10*3/uL (ref 0.0–0.1)
Basophils Relative: 1 %
Eosinophils Absolute: 0.3 10*3/uL (ref 0.0–0.5)
Eosinophils Relative: 4 %
HCT: 32.8 % — ABNORMAL LOW (ref 36.0–46.0)
Hemoglobin: 10.4 g/dL — ABNORMAL LOW (ref 12.0–15.0)
Immature Granulocytes: 0 %
Lymphocytes Relative: 16 %
Lymphs Abs: 1.4 10*3/uL (ref 0.7–4.0)
MCH: 27.1 pg (ref 26.0–34.0)
MCHC: 31.7 g/dL (ref 30.0–36.0)
MCV: 85.4 fL (ref 80.0–100.0)
Monocytes Absolute: 0.7 10*3/uL (ref 0.1–1.0)
Monocytes Relative: 8 %
Neutro Abs: 6.1 10*3/uL (ref 1.7–7.7)
Neutrophils Relative %: 71 %
Platelet Count: 312 10*3/uL (ref 150–400)
RBC: 3.84 MIL/uL — ABNORMAL LOW (ref 3.87–5.11)
RDW: 21.6 % — ABNORMAL HIGH (ref 11.5–15.5)
WBC Count: 8.6 10*3/uL (ref 4.0–10.5)
nRBC: 0 % (ref 0.0–0.2)

## 2022-09-20 LAB — CMP (CANCER CENTER ONLY)
ALT: 21 U/L (ref 0–44)
AST: 23 U/L (ref 15–41)
Albumin: 3.6 g/dL (ref 3.5–5.0)
Alkaline Phosphatase: 54 U/L (ref 38–126)
Anion gap: 9 (ref 5–15)
BUN: 14 mg/dL (ref 8–23)
CO2: 28 mmol/L (ref 22–32)
Calcium: 9.2 mg/dL (ref 8.9–10.3)
Chloride: 102 mmol/L (ref 98–111)
Creatinine: 0.93 mg/dL (ref 0.44–1.00)
GFR, Estimated: >60 mL/min (ref >60)
Glucose, Bld: 126 mg/dL — ABNORMAL HIGH (ref 70–99)
Potassium: 3.9 mmol/L (ref 3.5–5.1)
Sodium: 139 mmol/L (ref 135–145)
Total Bilirubin: 0.3 mg/dL (ref 0.3–1.2)
Total Protein: 6.8 g/dL (ref 6.5–8.1)

## 2022-09-20 LAB — TOTAL PROTEIN, URINE DIPSTICK: Protein, ur: NEGATIVE mg/dL

## 2022-09-20 MED ORDER — SODIUM CHLORIDE 0.9% FLUSH
10.0000 mL | INTRAVENOUS | Status: DC | PRN
  Administered 2022-09-20: 10 mL

## 2022-09-20 MED ORDER — HEPARIN SOD (PORK) LOCK FLUSH 100 UNIT/ML IV SOLN
500.0000 [IU] | Freq: Once | INTRAVENOUS | Status: AC | PRN
  Administered 2022-09-20: 500 [IU]

## 2022-09-20 NOTE — Progress Notes (Signed)
Patient seen by Ned Card NP today  Vitals are within treatment parameters.  Labs reviewed by Ned Card NP CBC diff reviewed and within treatment parameters, CMP pending.  Per physician team, patient will not be receiving treatment today.

## 2022-09-20 NOTE — Progress Notes (Signed)
[place] OFFICE PROGRESS NOTE   Diagnosis: Colon cancer  INTERVAL HISTORY:   Cynthia [name] returns as scheduled.  She completed cycle 2 FOLFIRI/Avastin 08/29/2022.  Chemotherapy was held 09/13/2022 due to neutropenia.  At present she reports feeling well.  No nausea or vomiting.  No mouth sores.  No diarrhea.  She notes a marked decline in her energy level following each treatment, lasting for about a week.  Objective:  Vital signs in last 24 hours:  Blood pressure 128/74, pulse 81, temperature 98.1 F (36.7 C), temperature source Oral, resp. rate 18, height 5' (1.524 m), weight 115 lb (52.2 kg), SpO2 97 %.    HEENT: No thrush or ulcers. Resp: Lungs clear bilaterally. Cardio: Regular rate and rhythm. GI: Abdomen soft and nontender.  No hepatosplenomegaly. Vascular: No leg edema. Neuro: Alert and oriented. Skin: Palms without erythema. Port-A-Cath without erythema.  Lab Results:  Lab Results  Component Value Date   WBC 8.6 09/20/2022   HGB 10.4 (L) 09/20/2022   HCT 32.8 (L) 09/20/2022   MCV 85.4 09/20/2022   PLT 312 09/20/2022   NEUTROABS 6.1 09/20/2022    Imaging:  No results found.  Medications: I have reviewed the patient's current medications.  Assessment/Plan: Moderately differentiated adenocarcinoma of the cecum, stage III (T4a, N1), status post a laparoscopic assisted right colectomy 10/22/2013 The tumor returned microsatellite stable with normal mismatch repair protein expression   Cycle 1 adjuvant Xeloda 12/25/2013.   Cycle 2 adjuvant Xeloda 01/15/2014.   Cycle 3 adjuvant Xeloda 02/05/2014.   Cycle 4 adjuvant Xeloda 03/18/2014, discontinued after 4 days secondary to diarrhea   Cycle 5 adjuvant Xeloda 04/16/2014, Xeloda dose reduced to 1000 mg in the a.m. and 500 mg in the p.m.   Cycle 6 adjuvant Xeloda 05/07/2014. Cycle 7 adjuvant Xeloda 05/28/2014. Cycle 8 adjuvant Xeloda 06/18/2014. Restaging CTs on 07/14/2014 with no evidence of  metastatic colon cancer Colonoscopy 10/02/2014 with diverticulosis in the sigmoid colon and in the descending colon. One 4 mm polyp in the sigmoid colon with complete resection. Polyp tissue not retrieved. Internal hemorrhoids. Next colonoscopy in 3 years for surveillance. Restaging CTs 07/16/2015 with 2 new hypodense liver lesions concerning for metastases, no other evidence of disease progression PET scan 07/28/2015 with a single hypermetabolic right liver lesion, no other evidence of metastatic disease Ultrasound-guided biopsy of the right liver lesion 07/30/2015 confirmed metastatic colon cancer, MSS, tumor mutation burden 1, KRAS G12D, PIK3CA, PTEN MRI abdomen 08/20/2015 with similar to slight decrease in size of 2 hepatic metastasis. No new liver lesions or extrahepatic metastatic disease identified. Radiofrequency ablation of 2 liver lesions 09/25/2015 MRI abdomen 02/02/2016-coagulative necrosis at the 2 ablated liver tumor sites, with no enhancement to suggest residual or recurrent liver tumor. No new liver masses or other findings of metastatic disease in the abdomen. MRI abdomen 08/10/2016 revealed stable ablation sites, no evidence of progressive metastatic disease Restaging CT 02/08/2017-stable treated liver lesions, no evidence of progressive metastatic disease, stable borderline enlarged right inguinal node  CEA with stable mild elevation 02/08/2017, 03/01/2017, 03/29/2017, 04/19/2017 CEA further elevated 08/21/2017 CTs 09/05/2017- possible local recurrence of tumor along the ablation site right hepatic lobe lesion.  Cephalad to the ablation site approximately 1.9 x 2.1 cm soft tissue mass progressive compared to the prior exam. Liver SBRT 10/17/2017, 10/19/2017, 10/23/2017, 10/25/2017, 10/27/2017 CT 02/19/2018- progressive ill-defined areas of low attenuation with enhancement surrounding the segment 6 ablation site, stable segment 8 ablation site enlargement of extracapsular lesion posteriorly, no new  hepatic lesions, wall thickening/edema at the distal ileum CT 05/22/2018- 2 new lung nodules suspicious for metastases, stable segment 8 ablation zone, slight decrease in the size of delayed perfusion at the segment 6 ablation zone, posterior extracapsular lesion is slightly smaller CT chest 07/13/2018- multiple lung nodules consistent with metastatic disease Cycle 1 Xeloda 07/23/2018 Cycle 2 Xeloda 08/13/2018 Cycle 3 Xeloda 09/03/2018 Cycle 4 Xeloda 09/24/2018 Restaging chest CT 10/12/2018- scattered pulmonary nodules/metastases mildly improved. Cycle 5 Xeloda 10/15/2018 Cycle 6 Xeloda 11/05/2018 Cycle 7 Xeloda 11/26/2018 Cycle 8 Xeloda 12/17/2018 Cycle 9 Xeloda 01/07/2019 CT chest 01/24/2019- stable pulmonary nodules.  No new pulmonary nodules.  Stable left hepatic lobe cysts and right hepatic lobe lesions.  No new hepatic lesions.  Stable right ninth and 10th rib lesions. Xeloda daily dosing schedule beginning 01/28/2019 due to hand-foot syndrome CT 05/27/2019- enlargement of lung metastases, similar right hepatic lobe lesion CT 08/05/2019-enlargement of bilateral lung nodules, no new lesions, stable right hepatic lesion Cycle 1 FOLFIRI 08/13/2019 Cycle 2 FOLFIRI 09/04/2019, Irinotecan dose reduced due to neutropenia Cycle 3 FOLFIRI 09/17/2019 Cycle 4 FOLFIRI 10/08/2019, 5-FU and irinotecan dose reduced CT chest 10/28/2019-bilateral pulmonary metastases with minimal improvement.  Stable ablation zone in the posterior right hepatic lobe. CT chest 03/03/2020-enlargement of pulmonary metastases, decreased right hepatic ablation site CT chest 07/07/2020-enlargement of pulmonary metastases, stable ablation site involving right hepatic lobe Cycle 1 FOLFIRI/Avastin 07/27/2020 Cycle 2 FOLFIRI/Avastin 08/18/2020 Treatment held 09/08/2020 due to neutropenia Cycle 3 FOLFIRI/Avastin 09/17/2020 (irinotecan dose reduced due to neutropenia) Cycle 4 FOLFIRI/Avastin 10/06/2020 Chemotherapy discontinued per patient request CT  chest 11/16/2020-decrease in size of dominant right perihilar nodule, other lung nodules are stable, stable right liver ablation site CTs 07/28/2022-increase size of numerous pulmonary metastases, increased number and size of numerous bone lesions; mixed lucency and sclerosis throughout the T9 vertebral body, mild compression and approximately 20% loss of central vertebral body height most compatible with a metastatic lesion with pathologic compression fracture; expansile sclerotic lesions noted in the lateral aspect of the right ninth and 10th ribs as well as the anterolateral aspect of the left sixth rib which also has surrounding soft tissue component. Cycle 1 FOLFIRI/Avastin 08/09/2022 Cycle 2 FOLFIRI/Avastin 08/29/2022, Emend and prophylactic dexamethasone added No further chemotherapy per patient due to poor tolerance Right ovary cystadenofibroma, status post a bilateral oophorectomy 10/22/2013 Remote history of cervical cancer. Iron deficiency anemia-resolved. Right calf deep vein thrombosis 10/24/2013-she completed 3 months of xarelto. Indeterminate 12 mm hypermetabolic right pelvic density on the staging PET scan 10/04/2013-CT followup recommended per the GI tumor conference 11/13/2013. No longer visualized on a CT 07/14/2014. Soft tissue implant overlying the posterior right liver on the CT 35/00/9381-WEX hypermetabolic on the PET scan 93/71/6967. C. difficile colitis 03/05/2014. She completed a course of Flagyl, recurrent diarrhea beginning 03/21/2014, positive C. difficile toxin 03/26/2014-resumed Flagyl for planned 10 day course. Vancomycin beginning 04/08/2014. Taper complete 05/21/2014.   9.  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  Xeloda dose/schedule adjusted beginning 01/28/2019. 10.  CVA-left centrum semiovale and left caudate 12/14/2021 11.  03/21/2022-implantation of loop recorder    Disposition: Cynthia [name] appears stable.  She is accompanied by her  daughter.  She has completed 2 cycles of FOLFIRI/Avastin.  She tolerated cycle 2 without significant acute toxicity.  Mainly she cites fatigue/malaise as the major side effect.  She has decided to discontinue treatment.  We discussed chemotherapy dose reductions, change in the schedule from 2 weeks to 3 weeks.  She does not want to  continue treatment.  We discussed a CT scan which she declines as it would not change her decision regardless of results.  We discussed a hospice referral.  She does not want to proceed with a hospice referral at this time.  For the poor energy level we discussed a trial of prednisone.  She declines this at present.  She agrees to return for a follow-up appointment in 2 weeks.  Patient seen with Dr. Benay Spice.    Ned Card ANP/GNP-BC   09/20/2022  9:39 AM This was a shared visit with Ned Card.  Cynthia [name] reports persistent malaise following chemotherapy.  She has decided against further chemotherapy.  We discussed treatment and restaging options with Cynthia [name] and her daughter.  She repeatedly stated she does not wish to receive further treatment or imaging.  We discussed hospice care.    I was present for greater than 50% of today's visit.  I performed medical decision making.  Julieanne Manson, MD

## 2022-09-20 NOTE — Patient Instructions (Signed)

## 2022-09-21 ENCOUNTER — Telehealth: Payer: Self-pay | Admitting: *Deleted

## 2022-09-21 NOTE — Telephone Encounter (Signed)
Daughter left VM that Ioana is constipated and in pain. Called back and she reports small BM yesterday, but having abdominal discomfort w/sensation of being constipated. Mild nausea. She was given a Women's laxative tablet earlier today without success. Suggested she start MiraLax 17 grams bid until good BM and then back down to daily. Daughter requesting something that can provide immediate relief. Suggested 1/2 bottle of magnesium citrate and wait several hours, then repeat other half if no BM. She can take her prn Tramadol for pain that she is having in abdomen, back and side. Instructed her to call in the morning with update.

## 2022-09-22 ENCOUNTER — Inpatient Hospital Stay: Payer: Medicare PPO

## 2022-09-23 ENCOUNTER — Other Ambulatory Visit: Payer: Self-pay | Admitting: *Deleted

## 2022-09-23 DIAGNOSIS — C787 Secondary malignant neoplasm of liver and intrahepatic bile duct: Secondary | ICD-10-CM

## 2022-09-28 ENCOUNTER — Telehealth: Payer: Self-pay

## 2022-09-28 NOTE — Telephone Encounter (Signed)
Daughter called and states that Cynthia [name] is still constipation and having  pain. She only had a bottle citrate magnesium, without success. The patient refuse to take a fleet emma or MiraLax daily. Mild nausea and pain in the lower back around to her navel. I advice the daughter to start MiraLax twice day until she have a bm then go back daily. I also advice to take one tramadol every 8 hours as needed for pain.

## 2022-09-29 ENCOUNTER — Telehealth: Payer: Self-pay | Admitting: *Deleted

## 2022-09-29 NOTE — Telephone Encounter (Signed)
Patient had left VM earlier today reporting diarrhea and sharp pain right abdomen. Called back and she reports having diarrhea x 4 today. Took Imodium and has not had a BM in 2 hours. Right abdominal pain has improved. She does not want to take the Tramadol due to it causing her constipation. No fever or nausea. Informed her the diarrhea may still be the residual of all she took to get her constipation to resolve. Push clear liquids today caffiene free and then bland diet for next 24 hours and then slowly advance. Call for diarrhea that does not respond to Imodium or abdominal pain that worsens.

## 2022-10-04 ENCOUNTER — Other Ambulatory Visit: Payer: Self-pay | Admitting: Nurse Practitioner

## 2022-10-04 DIAGNOSIS — C189 Malignant neoplasm of colon, unspecified: Secondary | ICD-10-CM

## 2022-10-10 ENCOUNTER — Inpatient Hospital Stay: Payer: Medicare PPO

## 2022-10-10 ENCOUNTER — Inpatient Hospital Stay: Payer: Medicare PPO | Admitting: Oncology

## 2022-10-10 VITALS — BP 132/70 | HR 72 | Temp 98.2°F | Resp 17 | Ht 60.0 in | Wt 115.6 lb

## 2022-10-10 DIAGNOSIS — C189 Malignant neoplasm of colon, unspecified: Secondary | ICD-10-CM

## 2022-10-10 DIAGNOSIS — R530 Neoplastic (malignant) related fatigue: Secondary | ICD-10-CM | POA: Diagnosis not present

## 2022-10-10 DIAGNOSIS — C180 Malignant neoplasm of cecum: Secondary | ICD-10-CM | POA: Diagnosis not present

## 2022-10-10 DIAGNOSIS — C7802 Secondary malignant neoplasm of left lung: Secondary | ICD-10-CM | POA: Diagnosis not present

## 2022-10-10 DIAGNOSIS — Z95828 Presence of other vascular implants and grafts: Secondary | ICD-10-CM

## 2022-10-10 DIAGNOSIS — C787 Secondary malignant neoplasm of liver and intrahepatic bile duct: Secondary | ICD-10-CM

## 2022-10-10 DIAGNOSIS — C7801 Secondary malignant neoplasm of right lung: Secondary | ICD-10-CM | POA: Diagnosis not present

## 2022-10-10 LAB — CEA (ACCESS): CEA (CHCC): 15.74 ng/mL — ABNORMAL HIGH (ref 0.00–5.00)

## 2022-10-10 MED ORDER — SODIUM CHLORIDE 0.9% FLUSH
10.0000 mL | INTRAVENOUS | Status: DC | PRN
  Administered 2022-10-10: 10 mL

## 2022-10-10 MED ORDER — HEPARIN SOD (PORK) LOCK FLUSH 100 UNIT/ML IV SOLN
500.0000 [IU] | Freq: Once | INTRAVENOUS | Status: AC | PRN
  Administered 2022-10-10: 500 [IU]

## 2022-10-10 NOTE — Progress Notes (Signed)
[place] OFFICE PROGRESS NOTE   Diagnosis: Colon cancer  INTERVAL HISTORY:   Cynthia [name] is as scheduled.  She reports an improved appetite.  She has intermittent discomfort at the right posterior lateral chest.  She takes tramadol as needed.  She fell on her deck 10/06/2022 and has a laceration at the right forehead.  She declined an emergency room visit.  She is using Neosporin on the wound.  Objective:  Vital signs in last 24 hours:  Blood pressure 132/70, pulse 72, temperature 98.2 F (36.8 C), temperature source Oral, resp. rate 17, height 5' (1.524 m), weight 115 lb 9.6 oz (52.4 kg), SpO2 100 %.    HEENT: 2-3 cm laceration at the right supra orbital region.  The wound is open approximately 1 cm with early granulation tissue.  No surrounding erythema.  No drainage. Resp: Lungs clear bilaterally Cardio: Regular rate and rhythm GI: Nontender, no hepatosplenomegaly Vascular: No leg edema  Musculoskeletal: No mass of the right chest wall  Portacath/PICC-without erythema  Lab Results:  Lab Results  Component Value Date   WBC 8.6 09/20/2022   HGB 10.4 (L) 09/20/2022   HCT 32.8 (L) 09/20/2022   MCV 85.4 09/20/2022   PLT 312 09/20/2022   NEUTROABS 6.1 09/20/2022    CMP  Lab Results  Component Value Date   NA 139 09/20/2022   K 3.9 09/20/2022   CL 102 09/20/2022   CO2 28 09/20/2022   GLUCOSE 126 (H) 09/20/2022   BUN 14 09/20/2022   CREATININE 0.93 09/20/2022   CALCIUM 9.2 09/20/2022   PROT 6.8 09/20/2022   ALBUMIN 3.6 09/20/2022   AST 23 09/20/2022   ALT 21 09/20/2022   ALKPHOS 54 09/20/2022   BILITOT 0.3 09/20/2022   GFRNONAA >60 09/20/2022   GFRAA >60 10/08/2019    Lab Results  Component Value Date   CEA1 4.27 05/24/2021   CEA 12.51 (H) 09/13/2022      Medications: I have reviewed the patient's current medications.   Assessment/Plan: Moderately differentiated adenocarcinoma of the cecum, stage III (T4a, N1), status post a  laparoscopic assisted right colectomy 10/22/2013 The tumor returned microsatellite stable with normal mismatch repair protein expression   Cycle 1 adjuvant Xeloda 12/25/2013.   Cycle 2 adjuvant Xeloda 01/15/2014.   Cycle 3 adjuvant Xeloda 02/05/2014.   Cycle 4 adjuvant Xeloda 03/18/2014, discontinued after 4 days secondary to diarrhea   Cycle 5 adjuvant Xeloda 04/16/2014, Xeloda dose reduced to 1000 mg in the a.m. and 500 mg in the p.m.   Cycle 6 adjuvant Xeloda 05/07/2014. Cycle 7 adjuvant Xeloda 05/28/2014. Cycle 8 adjuvant Xeloda 06/18/2014. Restaging CTs on 07/14/2014 with no evidence of metastatic colon cancer Colonoscopy 10/02/2014 with diverticulosis in the sigmoid colon and in the descending colon. One 4 mm polyp in the sigmoid colon with complete resection. Polyp tissue not retrieved. Internal hemorrhoids. Next colonoscopy in 3 years for surveillance. Restaging CTs 07/16/2015 with 2 new hypodense liver lesions concerning for metastases, no other evidence of disease progression PET scan 07/28/2015 with a single hypermetabolic right liver lesion, no other evidence of metastatic disease Ultrasound-guided biopsy of the right liver lesion 07/30/2015 confirmed metastatic colon cancer, MSS, tumor mutation burden 1, KRAS G12D, PIK3CA, PTEN MRI abdomen 08/20/2015 with similar to slight decrease in size of 2 hepatic metastasis. No new liver lesions or extrahepatic metastatic disease identified. Radiofrequency ablation of 2 liver lesions 09/25/2015 MRI abdomen 02/02/2016-coagulative necrosis at the 2 ablated liver tumor sites, with no enhancement to suggest residual or  recurrent liver tumor. No new liver masses or other findings of metastatic disease in the abdomen. MRI abdomen 08/10/2016 revealed stable ablation sites, no evidence of progressive metastatic disease Restaging CT 02/08/2017-stable treated liver lesions, no evidence of progressive metastatic disease, stable borderline enlarged right  inguinal node  CEA with stable mild elevation 02/08/2017, 03/01/2017, 03/29/2017, 04/19/2017 CEA further elevated 08/21/2017 CTs 09/05/2017- possible local recurrence of tumor along the ablation site right hepatic lobe lesion.  Cephalad to the ablation site approximately 1.9 x 2.1 cm soft tissue mass progressive compared to the prior exam. Liver SBRT 10/17/2017, 10/19/2017, 10/23/2017, 10/25/2017, 10/27/2017 CT 02/19/2018- progressive ill-defined areas of low attenuation with enhancement surrounding the segment 6 ablation site, stable segment 8 ablation site enlargement of extracapsular lesion posteriorly, no new hepatic lesions, wall thickening/edema at the distal ileum CT 05/22/2018- 2 new lung nodules suspicious for metastases, stable segment 8 ablation zone, slight decrease in the size of delayed perfusion at the segment 6 ablation zone, posterior extracapsular lesion is slightly smaller CT chest 07/13/2018- multiple lung nodules consistent with metastatic disease Cycle 1 Xeloda 07/23/2018 Cycle 2 Xeloda 08/13/2018 Cycle 3 Xeloda 09/03/2018 Cycle 4 Xeloda 09/24/2018 Restaging chest CT 10/12/2018- scattered pulmonary nodules/metastases mildly improved. Cycle 5 Xeloda 10/15/2018 Cycle 6 Xeloda 11/05/2018 Cycle 7 Xeloda 11/26/2018 Cycle 8 Xeloda 12/17/2018 Cycle 9 Xeloda 01/07/2019 CT chest 01/24/2019- stable pulmonary nodules.  No new pulmonary nodules.  Stable left hepatic lobe cysts and right hepatic lobe lesions.  No new hepatic lesions.  Stable right ninth and 10th rib lesions. Xeloda daily dosing schedule beginning 01/28/2019 due to hand-foot syndrome CT 05/27/2019- enlargement of lung metastases, similar right hepatic lobe lesion CT 08/05/2019-enlargement of bilateral lung nodules, no new lesions, stable right hepatic lesion Cycle 1 FOLFIRI 08/13/2019 Cycle 2 FOLFIRI 09/04/2019, Irinotecan dose reduced due to neutropenia Cycle 3 FOLFIRI 09/17/2019 Cycle 4 FOLFIRI 10/08/2019, 5-FU and irinotecan dose reduced CT  chest 10/28/2019-bilateral pulmonary metastases with minimal improvement.  Stable ablation zone in the posterior right hepatic lobe. CT chest 03/03/2020-enlargement of pulmonary metastases, decreased right hepatic ablation site CT chest 07/07/2020-enlargement of pulmonary metastases, stable ablation site involving right hepatic lobe Cycle 1 FOLFIRI/Avastin 07/27/2020 Cycle 2 FOLFIRI/Avastin 08/18/2020 Treatment held 09/08/2020 due to neutropenia Cycle 3 FOLFIRI/Avastin 09/17/2020 (irinotecan dose reduced due to neutropenia) Cycle 4 FOLFIRI/Avastin 10/06/2020 Chemotherapy discontinued per patient request CT chest 11/16/2020-decrease in size of dominant right perihilar nodule, other lung nodules are stable, stable right liver ablation site CTs 07/28/2022-increase size of numerous pulmonary metastases, increased number and size of numerous bone lesions; mixed lucency and sclerosis throughout the T9 vertebral body, mild compression and approximately 20% loss of central vertebral body height most compatible with a metastatic lesion with pathologic compression fracture; expansile sclerotic lesions noted in the lateral aspect of the right ninth and 10th ribs as well as the anterolateral aspect of the left sixth rib which also has surrounding soft tissue component. Cycle 1 FOLFIRI/Avastin 08/09/2022 Cycle 2 FOLFIRI/Avastin 08/29/2022, Emend and prophylactic dexamethasone added No further chemotherapy per patient due to poor tolerance Right ovary cystadenofibroma, status post a bilateral oophorectomy 10/22/2013 Remote history of cervical cancer. Iron deficiency anemia-resolved. Right calf deep vein thrombosis 10/24/2013-she completed 3 months of xarelto. Indeterminate 12 mm hypermetabolic right pelvic density on the staging PET scan 10/04/2013-CT followup recommended per the GI tumor conference 11/13/2013. No longer visualized on a CT 07/14/2014. Soft tissue implant overlying the posterior right liver on the CT  [id number] hypermetabolic on the PET scan [id number]. C. difficile colitis 03/05/2014.  She completed a course of Flagyl, recurrent diarrhea beginning 03/21/2014, positive C. difficile toxin 03/26/2014-resumed Flagyl for planned 10 day course. Vancomycin beginning 04/08/2014. Taper complete 05/21/2014.   9.  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  Xeloda dose/schedule adjusted beginning 01/28/2019. 10.  CVA-left centrum semiovale and left caudate 12/14/2021 11.  03/21/2022-implantation of loop recorder 12.  Fall 10/06/2022 with right forehead laceration     Disposition: Ms. Oien appears stable.  She has metastatic colon cancer.  She reconfirmed her decision against further treatment.  She declines a hospice referral.  She declined a trial of prednisone when she was here 09/20/2022.  She will now use her walker for ambulation.  She will call if the right forehead laceration develops evidence of infection.  She will return for an office visit in approximately 5 weeks.  We are available to see her sooner as needed.  [name], MD  10/10/2022  10:06 AM

## 2022-10-12 ENCOUNTER — Other Ambulatory Visit: Payer: Self-pay | Admitting: Oncology

## 2022-10-12 ENCOUNTER — Inpatient Hospital Stay: Payer: Medicare PPO

## 2022-10-17 ENCOUNTER — Ambulatory Visit (INDEPENDENT_AMBULATORY_CARE_PROVIDER_SITE_OTHER): Payer: Medicare PPO

## 2022-10-17 DIAGNOSIS — I639 Cerebral infarction, unspecified: Secondary | ICD-10-CM | POA: Diagnosis not present

## 2022-10-18 LAB — CUP PACEART REMOTE DEVICE CHECK
Date Time Interrogation Session: 20240303230227
Implantable Pulse Generator Implant Date: 20230807

## 2022-10-28 NOTE — Progress Notes (Signed)
[facility] Summary Report / Loop Recorder 

## 2022-11-14 ENCOUNTER — Inpatient Hospital Stay: Payer: Medicare PPO | Attending: Oncology

## 2022-11-14 ENCOUNTER — Inpatient Hospital Stay: Payer: Medicare PPO | Admitting: Nurse Practitioner

## 2022-11-14 ENCOUNTER — Encounter: Payer: Self-pay | Admitting: Nurse Practitioner

## 2022-11-14 VITALS — BP 123/79 | HR 71 | Temp 98.1°F | Ht 60.0 in | Wt 113.0 lb

## 2022-11-14 DIAGNOSIS — Z95828 Presence of other vascular implants and grafts: Secondary | ICD-10-CM

## 2022-11-14 DIAGNOSIS — C180 Malignant neoplasm of cecum: Secondary | ICD-10-CM | POA: Diagnosis not present

## 2022-11-14 DIAGNOSIS — C7801 Secondary malignant neoplasm of right lung: Secondary | ICD-10-CM | POA: Diagnosis not present

## 2022-11-14 DIAGNOSIS — C787 Secondary malignant neoplasm of liver and intrahepatic bile duct: Secondary | ICD-10-CM

## 2022-11-14 DIAGNOSIS — C7802 Secondary malignant neoplasm of left lung: Secondary | ICD-10-CM | POA: Insufficient documentation

## 2022-11-14 DIAGNOSIS — C189 Malignant neoplasm of colon, unspecified: Secondary | ICD-10-CM | POA: Diagnosis not present

## 2022-11-14 MED ORDER — HEPARIN SOD (PORK) LOCK FLUSH 100 UNIT/ML IV SOLN
500.0000 [IU] | Freq: Once | INTRAVENOUS | Status: AC | PRN
  Administered 2022-11-14: 500 [IU]

## 2022-11-14 MED ORDER — GABAPENTIN 400 MG PO CAPS
ORAL_CAPSULE | ORAL | 5 refills | Status: AC
Start: 2022-11-14 — End: ?

## 2022-11-14 MED ORDER — SODIUM CHLORIDE 0.9% FLUSH
10.0000 mL | INTRAVENOUS | Status: DC | PRN
  Administered 2022-11-14: 10 mL

## 2022-11-14 MED ORDER — TRAMADOL HCL 50 MG PO TABS
ORAL_TABLET | ORAL | 0 refills | Status: DC
Start: 2022-11-14 — End: 2022-12-09

## 2022-11-14 NOTE — Progress Notes (Signed)
[facility] OFFICE PROGRESS NOTE   Diagnosis: Colon cancer  INTERVAL HISTORY:   Cynthia [name] returns as scheduled.  No further falls.  She is ambulating with a walker.  No nausea or vomiting.  Intermittent constipation.  She takes a laxative as needed.  She intermittently notes diarrhea after taking the laxative.  Pain is controlled with a combination of scheduled gabapentin and as needed tramadol.  Objective:  Vital signs in last 24 hours:  Blood pressure 123/79, pulse 71, temperature 98.1 F (36.7 C), temperature source Oral, height 5' (1.524 m), weight 113 lb (51.3 kg), SpO2 98 %.    Resp: Lungs clear bilaterally. Cardio: Regular rate and rhythm. GI: No hepatosplenomegaly. Vascular: No leg edema. Skin: No mass at the right chest wall. Port-A-Cath without erythema.  Lab Results:  Lab Results  Component Value Date   WBC 8.6 09/20/2022   HGB 10.4 (L) 09/20/2022   HCT 32.8 (L) 09/20/2022   MCV 85.4 09/20/2022   PLT 312 09/20/2022   NEUTROABS 6.1 09/20/2022    Imaging:  No results found.  Medications: I have reviewed the patient's current medications.  Assessment/Plan: Moderately differentiated adenocarcinoma of the cecum, stage III (T4a, N1), status post a laparoscopic assisted right colectomy 10/22/2013 The tumor returned microsatellite stable with normal mismatch repair protein expression   Cycle 1 adjuvant Xeloda 12/25/2013.   Cycle 2 adjuvant Xeloda 01/15/2014.   Cycle 3 adjuvant Xeloda 02/05/2014.   Cycle 4 adjuvant Xeloda 03/18/2014, discontinued after 4 days secondary to diarrhea   Cycle 5 adjuvant Xeloda 04/16/2014, Xeloda dose reduced to 1000 mg in the a.m. and 500 mg in the p.m.   Cycle 6 adjuvant Xeloda 05/07/2014. Cycle 7 adjuvant Xeloda 05/28/2014. Cycle 8 adjuvant Xeloda 06/18/2014. Restaging CTs on 07/14/2014 with no evidence of metastatic colon cancer Colonoscopy 10/02/2014 with diverticulosis in the sigmoid colon and in the  descending colon. One 4 mm polyp in the sigmoid colon with complete resection. Polyp tissue not retrieved. Internal hemorrhoids. Next colonoscopy in 3 years for surveillance. Restaging CTs 07/16/2015 with 2 new hypodense liver lesions concerning for metastases, no other evidence of disease progression PET scan 07/28/2015 with a single hypermetabolic right liver lesion, no other evidence of metastatic disease Ultrasound-guided biopsy of the right liver lesion 07/30/2015 confirmed metastatic colon cancer, MSS, tumor mutation burden 1, KRAS G12D, PIK3CA, PTEN MRI abdomen 08/20/2015 with similar to slight decrease in size of 2 hepatic metastasis. No new liver lesions or extrahepatic metastatic disease identified. Radiofrequency ablation of 2 liver lesions 09/25/2015 MRI abdomen 02/02/2016-coagulative necrosis at the 2 ablated liver tumor sites, with no enhancement to suggest residual or recurrent liver tumor. No new liver masses or other findings of metastatic disease in the abdomen. MRI abdomen 08/10/2016 revealed stable ablation sites, no evidence of progressive metastatic disease Restaging CT 02/08/2017-stable treated liver lesions, no evidence of progressive metastatic disease, stable borderline enlarged right inguinal node  CEA with stable mild elevation 02/08/2017, 03/01/2017, 03/29/2017, 04/19/2017 CEA further elevated 08/21/2017 CTs 09/05/2017- possible local recurrence of tumor along the ablation site right hepatic lobe lesion.  Cephalad to the ablation site approximately 1.9 x 2.1 cm soft tissue mass progressive compared to the prior exam. Liver SBRT 10/17/2017, 10/19/2017, 10/23/2017, 10/25/2017, 10/27/2017 CT 02/19/2018- progressive ill-defined areas of low attenuation with enhancement surrounding the segment 6 ablation site, stable segment 8 ablation site enlargement of extracapsular lesion posteriorly, no new hepatic lesions, wall thickening/edema at the distal ileum CT 05/22/2018- 2 new lung nodules  suspicious for  metastases, stable segment 8 ablation zone, slight decrease in the size of delayed perfusion at the segment 6 ablation zone, posterior extracapsular lesion is slightly smaller CT chest 07/13/2018- multiple lung nodules consistent with metastatic disease Cycle 1 Xeloda 07/23/2018 Cycle 2 Xeloda 08/13/2018 Cycle 3 Xeloda 09/03/2018 Cycle 4 Xeloda 09/24/2018 Restaging chest CT 10/12/2018- scattered pulmonary nodules/metastases mildly improved. Cycle 5 Xeloda 10/15/2018 Cycle 6 Xeloda 11/05/2018 Cycle 7 Xeloda 11/26/2018 Cycle 8 Xeloda 12/17/2018 Cycle 9 Xeloda 01/07/2019 CT chest 01/24/2019- stable pulmonary nodules.  No new pulmonary nodules.  Stable left hepatic lobe cysts and right hepatic lobe lesions.  No new hepatic lesions.  Stable right ninth and 10th rib lesions. Xeloda daily dosing schedule beginning 01/28/2019 due to hand-foot syndrome CT 05/27/2019- enlargement of lung metastases, similar right hepatic lobe lesion CT 08/05/2019-enlargement of bilateral lung nodules, no new lesions, stable right hepatic lesion Cycle 1 FOLFIRI 08/13/2019 Cycle 2 FOLFIRI 09/04/2019, Irinotecan dose reduced due to neutropenia Cycle 3 FOLFIRI 09/17/2019 Cycle 4 FOLFIRI 10/08/2019, 5-FU and irinotecan dose reduced CT chest 10/28/2019-bilateral pulmonary metastases with minimal improvement.  Stable ablation zone in the posterior right hepatic lobe. CT chest 03/03/2020-enlargement of pulmonary metastases, decreased right hepatic ablation site CT chest 07/07/2020-enlargement of pulmonary metastases, stable ablation site involving right hepatic lobe Cycle 1 FOLFIRI/Avastin 07/27/2020 Cycle 2 FOLFIRI/Avastin 08/18/2020 Treatment held 09/08/2020 due to neutropenia Cycle 3 FOLFIRI/Avastin 09/17/2020 (irinotecan dose reduced due to neutropenia) Cycle 4 FOLFIRI/Avastin 10/06/2020 Chemotherapy discontinued per patient request CT chest 11/16/2020-decrease in size of dominant right perihilar nodule, other lung nodules are  stable, stable right liver ablation site CTs 07/28/2022-increase size of numerous pulmonary metastases, increased number and size of numerous bone lesions; mixed lucency and sclerosis throughout the T9 vertebral body, mild compression and approximately 20% loss of central vertebral body height most compatible with a metastatic lesion with pathologic compression fracture; expansile sclerotic lesions noted in the lateral aspect of the right ninth and 10th ribs as well as the anterolateral aspect of the left sixth rib which also has surrounding soft tissue component. Cycle 1 FOLFIRI/Avastin 08/09/2022 Cycle 2 FOLFIRI/Avastin 08/29/2022, Emend and prophylactic dexamethasone added No further chemotherapy per patient due to poor tolerance Right ovary cystadenofibroma, status post a bilateral oophorectomy 10/22/2013 Remote history of cervical cancer. Iron deficiency anemia-resolved. Right calf deep vein thrombosis 10/24/2013-she completed 3 months of xarelto. Indeterminate 12 mm hypermetabolic right pelvic density on the staging PET scan 10/04/2013-CT followup recommended per the GI tumor conference 11/13/2013. No longer visualized on a CT 07/14/2014. Soft tissue implant overlying the posterior right liver on the CT [id number] hypermetabolic on the PET scan [id number]. C. difficile colitis 03/05/2014. She completed a course of Flagyl, recurrent diarrhea beginning 03/21/2014, positive C. difficile toxin 03/26/2014-resumed Flagyl for planned 10 day course. Vancomycin beginning 04/08/2014. Taper complete 05/21/2014.   9.  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  Xeloda dose/schedule adjusted beginning 01/28/2019. 10.  CVA-left centrum semiovale and left caudate 12/14/2021 11.  03/21/2022-implantation of loop recorder 12.  Fall 10/06/2022 with right forehead laceration    Disposition: Cynthia [name] appears unchanged.  Plan to continue to follow on a supportive care approach.  She  does not feel she is ready for hospice.  She will return for a port flush and follow-up visit in 6 weeks.  She understands she can contact the office in the interim with any problems.    Ned Card ANP/GNP-BC   11/14/2022  9:46 AM

## 2022-11-21 ENCOUNTER — Ambulatory Visit (INDEPENDENT_AMBULATORY_CARE_PROVIDER_SITE_OTHER): Payer: Medicare PPO

## 2022-11-21 DIAGNOSIS — I639 Cerebral infarction, unspecified: Secondary | ICD-10-CM

## 2022-11-21 LAB — CUP PACEART REMOTE DEVICE CHECK
Date Time Interrogation Session: 20240405230024
Implantable Pulse Generator Implant Date: 20230807

## 2022-11-25 NOTE — Progress Notes (Signed)
[facility] Summary Report / Loop Recorder 

## 2022-11-29 DIAGNOSIS — I6523 Occlusion and stenosis of bilateral carotid arteries: Secondary | ICD-10-CM | POA: Diagnosis not present

## 2022-11-29 DIAGNOSIS — I700 Atherosclerosis of aorta: Secondary | ICD-10-CM | POA: Diagnosis not present

## 2022-11-29 DIAGNOSIS — E46 Unspecified protein-calorie malnutrition: Secondary | ICD-10-CM | POA: Diagnosis not present

## 2022-11-29 DIAGNOSIS — E785 Hyperlipidemia, unspecified: Secondary | ICD-10-CM | POA: Diagnosis not present

## 2022-11-29 DIAGNOSIS — R112 Nausea with vomiting, unspecified: Secondary | ICD-10-CM | POA: Diagnosis not present

## 2022-11-29 DIAGNOSIS — I69354 Hemiplegia and hemiparesis following cerebral infarction affecting left non-dominant side: Secondary | ICD-10-CM | POA: Diagnosis not present

## 2022-11-29 DIAGNOSIS — I10 Essential (primary) hypertension: Secondary | ICD-10-CM | POA: Diagnosis not present

## 2022-11-29 DIAGNOSIS — I7121 Aneurysm of the ascending aorta, without rupture: Secondary | ICD-10-CM | POA: Diagnosis not present

## 2022-11-29 DIAGNOSIS — Z0000 Encounter for general adult medical examination without abnormal findings: Secondary | ICD-10-CM | POA: Diagnosis not present

## 2022-12-08 ENCOUNTER — Other Ambulatory Visit: Payer: Self-pay | Admitting: Nurse Practitioner

## 2022-12-08 DIAGNOSIS — C189 Malignant neoplasm of colon, unspecified: Secondary | ICD-10-CM

## 2022-12-09 ENCOUNTER — Other Ambulatory Visit: Payer: Self-pay | Admitting: Nurse Practitioner

## 2022-12-09 DIAGNOSIS — C189 Malignant neoplasm of colon, unspecified: Secondary | ICD-10-CM

## 2022-12-09 MED ORDER — TRAMADOL HCL 50 MG PO TABS
ORAL_TABLET | ORAL | 0 refills | Status: DC
Start: 2022-12-09 — End: 2022-12-26

## 2022-12-11 NOTE — Telephone Encounter (Signed)
Refilled on 12/09/2022

## 2022-12-22 LAB — CUP PACEART REMOTE DEVICE CHECK
Date Time Interrogation Session: 20240508230342
Implantable Pulse Generator Implant Date: 20230807

## 2022-12-23 ENCOUNTER — Inpatient Hospital Stay: Payer: Medicare PPO | Admitting: Oncology

## 2022-12-23 ENCOUNTER — Telehealth: Payer: Self-pay | Admitting: Oncology

## 2022-12-23 ENCOUNTER — Inpatient Hospital Stay: Payer: Medicare PPO | Attending: Oncology

## 2022-12-23 ENCOUNTER — Encounter: Payer: Self-pay | Admitting: *Deleted

## 2022-12-23 NOTE — Progress Notes (Signed)
"  No show" for flush/OV today. She reports she was not aware of the appointment. Scheduling message sent to reschedule for late May.

## 2022-12-23 NOTE — Telephone Encounter (Signed)
Left patient a vm regarding upcoming appointment  

## 2022-12-26 ENCOUNTER — Other Ambulatory Visit: Payer: Self-pay | Admitting: Nurse Practitioner

## 2022-12-26 ENCOUNTER — Ambulatory Visit (INDEPENDENT_AMBULATORY_CARE_PROVIDER_SITE_OTHER): Payer: Medicare PPO

## 2022-12-26 ENCOUNTER — Telehealth: Payer: Self-pay | Admitting: *Deleted

## 2022-12-26 DIAGNOSIS — I639 Cerebral infarction, unspecified: Secondary | ICD-10-CM | POA: Diagnosis not present

## 2022-12-26 DIAGNOSIS — C189 Malignant neoplasm of colon, unspecified: Secondary | ICD-10-CM

## 2022-12-26 MED ORDER — TRAMADOL HCL 50 MG PO TABS
50.0000 mg | ORAL_TABLET | Freq: Three times a day (TID) | ORAL | 0 refills | Status: DC | PRN
Start: 2022-12-26 — End: 2023-04-06

## 2022-12-26 MED ORDER — TRAMADOL HCL 50 MG PO TABS
50.0000 mg | ORAL_TABLET | Freq: Three times a day (TID) | ORAL | 0 refills | Status: DC | PRN
Start: 2022-12-26 — End: 2022-12-26

## 2022-12-26 NOTE — Telephone Encounter (Addendum)
Received refill request for Tramadol. She reports on occasion she will need 1-2 tablets/day. Does not take 1/2 tablet any longer--not effective.  Pain is across her abdomen--a tightness that she thinks is related to her Guillain-Barre.

## 2022-12-28 NOTE — Progress Notes (Signed)
[facility] Summary Report / Loop Recorder 

## 2023-01-13 ENCOUNTER — Other Ambulatory Visit: Payer: Self-pay | Admitting: Neurology

## 2023-01-18 NOTE — Progress Notes (Signed)
NEUROLOGY FOLLOW UP OFFICE NOTE  Cynthia [name] [id number]  Assessment/Plan:   Left middle cerebral artery territory infarcts within left caudate tail and left centrum semiovale, likely secondary to small vessel disease but given involvement of two locations, would also consider cardioembolic source. Hypertension Hyperlipidemia     1  Secondary stroke prevention: Plavix 75mg  daily Atorvastatin 40mg  daily.  LDL goal less than 70 - recheck lipid panel and LFTs today. Normotensive blood pressure Hgb A1c goal less than 7 2.   With implantable loop recorder monitored by cardiology for evaluation of a fib 3.  Mediterranean diet 4.   Routine exercise 5.  Follow up as needed.   Subjective:  Cynthia [name] is an 87 year old female with HTN and history of DVT, Giullain Barre syndrome, colon cancer with hepatic metastasis and cervical cancer who follows up for stroke. She is accompanied by her daughter who supplements history.  UPDATE: Current medications:  Plavix 75mg  daily, atorvastatin 40mg  daily, metoprolol succinate    She is now with an implantable loop recorder.  Thus far, a fib has not been detected. Overall, she does not feel well.  She feels fatigued.  She naps during the day.  Legs feel weak.  She has no appetite.      HISTORY: On 12/14/2021, she developed acute onset right leg and hand weakness.  Admitted to [facility] where CT head revealed no acute abnormality but MRI showed small acute infarcts in the left caudate tail and left centrum semiovale.  MRA of head showed mild left vertebral artery distal stenosis but otherwise unremarkable.  Carotid ultrasound was unremarkable.  TTE showed EF 60-65% with grade 1 diastolic dysfunction but no no atrial level shunt.  LDL was 141 and Hgb A1c was 5.7.  She was not on antithrombotic therapy prior to admission.  She was discharged on ASA 81mg  and Plavix 75mg  daily for 3 weeks followed by ASA monotherapy.  She was also  started on atorvastatin 40mg  daily.  Undergoing outpatient PT/OT.  She no longer drags the right foot.  Still has difficulty using the right hand such as writing.  Still feels numbness in the left hand from the first stroke.  Long term cardiac monitoring did not reveal a fib.  PAST MEDICAL HISTORY: Past Medical History:  Diagnosis Date   Anemia    Blood transfusion without reported diagnosis jan 2015   C. difficile colitis 03/05/14, 03/22/14, 04/05/14   recurrent   Cancer (HCC)    liver   Cervical cancer (HCC) 1972   Colon cancer (HCC) JAN 2015   Colon cancer (HCC)    Complication of anesthesia    slow to awaken in past, DONE WELL RECENTLY   DVT (deep venous thrombosis) (HCC) 10/24/13   Right leg   Guillain-Barre (HCC) 09/2015   Heart murmur    Hypertension    no bp meds    Liver cyst    Stroke (HCC) 12/2014   NUMBNESS ON LEFT SIDE   Tinnitus of both ears ALL THE TIME    MEDICATIONS: Current Outpatient Medications on File Prior to Visit  Medication Sig Dispense Refill   acetaminophen (TYLENOL) 500 MG tablet Take 1,000 mg by mouth every 6 (six) hours as needed for mild pain.      atorvastatin (LIPITOR) 40 MG tablet TAKE 1 TABLET(40 MG) BY MOUTH DAILY 30 tablet 1   Cholecalciferol (VITAMIN D3) 25 MCG (1000 UT) CAPS 1 capsule     clopidogrel (PLAVIX) 75  MG tablet 1 tablet Orally Once a day     dexamethasone (DECADRON) 4 MG tablet Take 1 tablet (4 mg total) by mouth 2 (two) times daily. For 3 days, begin day 2 after chemo (Patient not taking: Reported on 10/10/2022) 30 tablet 0   gabapentin (NEURONTIN) 400 MG capsule TAKE 1 CAPSULE BY MOUTH THREE TIMES DAILY. MAY TAKE ADDITIONAL DOSE AT NIGHT AS NEEDED 100 capsule 5   lidocaine-prilocaine (EMLA) cream Apply 1 application topically as directed. Apply to port site 1 hour prior to stick and cover with plastic wrap 30 g 2   loperamide (IMODIUM) 2 MG capsule Take 2-4 mg by mouth as needed for diarrhea or loose stools.     metoprolol succinate  (TOPROL-XL) 100 MG 24 hr tablet Take 100 mg by mouth every evening.     ondansetron (ZOFRAN) 8 MG tablet Take 1 tablet (8 mg total) by mouth every 8 (eight) hours as needed for nausea or vomiting. (Patient not taking: Reported on 08/29/2022) 30 tablet 1   prochlorperazine (COMPAZINE) 5 MG tablet Take 1 tablet (5 mg total) by mouth every 6 (six) hours as needed for nausea or vomiting. 30 tablet 0   traMADol (ULTRAM) 50 MG tablet Take 1 tablet (50 mg total) by mouth every 8 (eight) hours as needed. 60 tablet 0   vitamin B-12 (CYANOCOBALAMIN) 1000 MCG tablet 1 tablet     Current Facility-Administered Medications on File Prior to Visit  Medication Dose Route Frequency Provider Last Rate Last Admin   heparin lock flush 100 unit/mL  500 Units Intracatheter Once PRN Ladene Artist, MD       sodium chloride flush (NS) 0.9 % injection 10 mL  10 mL Intracatheter PRN Ladene Artist, MD       sodium chloride flush (NS) 0.9 % injection 10 mL  10 mL Intracatheter PRN Ladene Artist, MD   10 mL at 05/24/21 [id number]    ALLERGIES: Allergies  Allergen Reactions   Morphine And Codeine Other (See Comments)    Patient has hallucinations    FAMILY HISTORY: Family History  Problem Relation Age of Onset   Rheum arthritis Mother    Heart failure Father    Diabetes Sister    Cancer Sister        leukemia   Diabetes Brother    Heart failure Brother    Hypertension Brother    Breast cancer Maternal Aunt    Cancer Maternal Grandmother        GIST   Heart failure Maternal Grandfather    Aneurysm Paternal Grandmother       Objective:  Blood pressure 122/66, pulse (!) 50, height 5' (1.524 m), weight 107 lb (48.5 kg), SpO2 99 %. General: No acute distress.  Patient appears well-groomed.   Head:  Normocephalic/atraumatic Eyes:  Fundi examined but not visualized Neck: supple, no paraspinal tenderness, full range of motion Heart:  Regular rate and rhythm Neurological Exam: alert and oriented.  Speech  fluent and not dysarthric, language intact.  CN II-XII intact. Bulk and tone normal, muscle strength 5-/5 throughout.  Sensation to pinprick intact.  Vibratory sensation reduced in feet.  Deep tendon reflexes 2+ throughout.  Finger to nose testing intact.  Cautious wide-based gait.  Uses walker.  Romberg positive.     Shon Millet, DO  CC: Mila Palmer, MD

## 2023-01-20 ENCOUNTER — Ambulatory Visit: Payer: Medicare PPO | Admitting: Neurology

## 2023-01-20 ENCOUNTER — Encounter: Payer: Self-pay | Admitting: Neurology

## 2023-01-20 VITALS — BP 122/66 | HR 50 | Ht 60.0 in | Wt 107.0 lb

## 2023-01-20 DIAGNOSIS — I6389 Other cerebral infarction: Secondary | ICD-10-CM

## 2023-01-20 DIAGNOSIS — E785 Hyperlipidemia, unspecified: Secondary | ICD-10-CM | POA: Diagnosis not present

## 2023-01-20 DIAGNOSIS — I10 Essential (primary) hypertension: Secondary | ICD-10-CM | POA: Diagnosis not present

## 2023-01-20 NOTE — Patient Instructions (Signed)
Continue Plavix, atorvastatin, metoprolol Continue monitoring for a fib with cardiology

## 2023-01-23 ENCOUNTER — Ambulatory Visit (INDEPENDENT_AMBULATORY_CARE_PROVIDER_SITE_OTHER): Payer: Medicare PPO

## 2023-01-23 DIAGNOSIS — I639 Cerebral infarction, unspecified: Secondary | ICD-10-CM

## 2023-01-23 NOTE — Progress Notes (Signed)
[facility] Summary Report / Loop Recorder 

## 2023-01-24 LAB — CUP PACEART REMOTE DEVICE CHECK
Date Time Interrogation Session: 20240610230149
Implantable Pulse Generator Implant Date: 20230807

## 2023-01-26 ENCOUNTER — Inpatient Hospital Stay: Payer: Medicare PPO | Admitting: Oncology

## 2023-01-26 ENCOUNTER — Ambulatory Visit (HOSPITAL_BASED_OUTPATIENT_CLINIC_OR_DEPARTMENT_OTHER)
Admission: RE | Admit: 2023-01-26 | Discharge: 2023-01-26 | Disposition: A | Discharge status: Home / Self Care | Payer: Medicare PPO | Source: Ambulatory Visit | Attending: Oncology | Admitting: Oncology

## 2023-01-26 ENCOUNTER — Inpatient Hospital Stay: Payer: Medicare PPO | Attending: Oncology

## 2023-01-26 VITALS — BP 116/67 | HR 67 | Temp 98.2°F | Resp 18 | Ht 60.0 in | Wt 106.6 lb

## 2023-01-26 DIAGNOSIS — C7800 Secondary malignant neoplasm of unspecified lung: Secondary | ICD-10-CM | POA: Insufficient documentation

## 2023-01-26 DIAGNOSIS — R531 Weakness: Secondary | ICD-10-CM | POA: Insufficient documentation

## 2023-01-26 DIAGNOSIS — C189 Malignant neoplasm of colon, unspecified: Secondary | ICD-10-CM | POA: Diagnosis not present

## 2023-01-26 DIAGNOSIS — C180 Malignant neoplasm of cecum: Secondary | ICD-10-CM | POA: Insufficient documentation

## 2023-01-26 DIAGNOSIS — C787 Secondary malignant neoplasm of liver and intrahepatic bile duct: Secondary | ICD-10-CM

## 2023-01-26 DIAGNOSIS — Z95828 Presence of other vascular implants and grafts: Secondary | ICD-10-CM

## 2023-01-26 DIAGNOSIS — M549 Dorsalgia, unspecified: Secondary | ICD-10-CM | POA: Diagnosis not present

## 2023-01-26 MED ORDER — HEPARIN SOD (PORK) LOCK FLUSH 100 UNIT/ML IV SOLN
500.0000 [IU] | Freq: Once | INTRAVENOUS | Status: AC | PRN
  Administered 2023-01-26: 500 [IU]

## 2023-01-26 MED ORDER — SODIUM CHLORIDE 0.9% FLUSH
10.0000 mL | INTRAVENOUS | Status: DC | PRN
  Administered 2023-01-26: 10 mL

## 2023-01-26 MED ORDER — DEXAMETHASONE 4 MG PO TABS: 4.0000 mg | ORAL_TABLET | Freq: Two times a day (BID) | ORAL | 0 refills | Status: DC

## 2023-01-26 NOTE — Progress Notes (Signed)
[facility] OFFICE PROGRESS NOTE   Diagnosis: Colon cancer  INTERVAL HISTORY:   Cynthia [name] returns for a scheduled visit.  She reports mid back discomfort for the past several weeks.  The pain radiates to the bilateral anterior upper abdomen/lower chest wall.  Tramadol helps.  She noted leg weakness beginning today.  She is unable to ambulate due to the leg weakness.  No numbness.  No difficulty with bowel or bladder control.  Objective:  Vital signs in last 24 hours:  Blood pressure 116/67, pulse 67, temperature 98.2 F (36.8 C), temperature source Oral, resp. rate 18, height 5' (1.524 m), SpO2 96 %.    Resp: Lungs clear bilaterally Cardio: Regular rate and rhythm GI: Nontender, no hepatosplenomegaly Vascular: No leg edema Neuro: Alert and oriented.  The motor exam appears intact in the upper extremities bilaterally.  4/5 strength with flexion at the right hip, the motor exam otherwise appears intact in the legs and feet.  She is able to stand and ambulate with assistance.  Station is intact to light touch over the abdomen and legs Skin: Slight induration/nodularity at the right inferior lateral chest wall  Portacath/PICC-without erythema  Lab Results:  Lab Results  Component Value Date   WBC 8.6 09/20/2022   HGB 10.4 (L) 09/20/2022   HCT 32.8 (L) 09/20/2022   MCV 85.4 09/20/2022   PLT 312 09/20/2022   NEUTROABS 6.1 09/20/2022    CMP  Lab Results  Component Value Date   NA 139 09/20/2022   K 3.9 09/20/2022   CL 102 09/20/2022   CO2 28 09/20/2022   GLUCOSE 126 (H) 09/20/2022   BUN 14 09/20/2022   CREATININE 0.93 09/20/2022   CALCIUM 9.2 09/20/2022   PROT 6.8 09/20/2022   ALBUMIN 3.6 09/20/2022   AST 23 09/20/2022   ALT 21 09/20/2022   ALKPHOS 54 09/20/2022   BILITOT 0.3 09/20/2022   GFRNONAA >60 09/20/2022   GFRAA >60 10/08/2019    Lab Results  Component Value Date   CEA1 4.27 05/24/2021   CEA 15.74 (H) 10/10/2022    Lab Results   Component Value Date   INR 1.1 12/14/2021   LABPROT 13.9 12/14/2021    Imaging:  CUP PACEART REMOTE DEVICE CHECK  Result Date: 01/24/2023 ILR summary report received. Battery status OK. Normal device function. No new symptom, tachy, brady, or pause episodes. No new AF episodes. Monthly summary reports and ROV/PRN LA, CVRS   Medications: I have reviewed the patient's current medications.   Assessment/Plan: Moderately differentiated adenocarcinoma of the cecum, stage III (T4a, N1), status post a laparoscopic assisted right colectomy 10/22/2013 The tumor returned microsatellite stable with normal mismatch repair protein expression   Cycle 1 adjuvant Xeloda 12/25/2013.   Cycle 2 adjuvant Xeloda 01/15/2014.   Cycle 3 adjuvant Xeloda 02/05/2014.   Cycle 4 adjuvant Xeloda 03/18/2014, discontinued after 4 days secondary to diarrhea   Cycle 5 adjuvant Xeloda 04/16/2014, Xeloda dose reduced to 1000 mg in the a.m. and 500 mg in the p.m.   Cycle 6 adjuvant Xeloda 05/07/2014. Cycle 7 adjuvant Xeloda 05/28/2014. Cycle 8 adjuvant Xeloda 06/18/2014. Restaging CTs on 07/14/2014 with no evidence of metastatic colon cancer Colonoscopy 10/02/2014 with diverticulosis in the sigmoid colon and in the descending colon. One 4 mm polyp in the sigmoid colon with complete resection. Polyp tissue not retrieved. Internal hemorrhoids. Next colonoscopy in 3 years for surveillance. Restaging CTs 07/16/2015 with 2 new hypodense liver lesions concerning for metastases, no other evidence of disease progression  PET scan 07/28/2015 with a single hypermetabolic right liver lesion, no other evidence of metastatic disease Ultrasound-guided biopsy of the right liver lesion 07/30/2015 confirmed metastatic colon cancer, MSS, tumor mutation burden 1, KRAS G12D, PIK3CA, PTEN MRI abdomen 08/20/2015 with similar to slight decrease in size of 2 hepatic metastasis. No new liver lesions or extrahepatic metastatic disease  identified. Radiofrequency ablation of 2 liver lesions 09/25/2015 MRI abdomen 02/02/2016-coagulative necrosis at the 2 ablated liver tumor sites, with no enhancement to suggest residual or recurrent liver tumor. No new liver masses or other findings of metastatic disease in the abdomen. MRI abdomen 08/10/2016 revealed stable ablation sites, no evidence of progressive metastatic disease Restaging CT 02/08/2017-stable treated liver lesions, no evidence of progressive metastatic disease, stable borderline enlarged right inguinal node  CEA with stable mild elevation 02/08/2017, 03/01/2017, 03/29/2017, 04/19/2017 CEA further elevated 08/21/2017 CTs 09/05/2017- possible local recurrence of tumor along the ablation site right hepatic lobe lesion.  Cephalad to the ablation site approximately 1.9 x 2.1 cm soft tissue mass progressive compared to the prior exam. Liver SBRT 10/17/2017, 10/19/2017, 10/23/2017, 10/25/2017, 10/27/2017 CT 02/19/2018- progressive ill-defined areas of low attenuation with enhancement surrounding the segment 6 ablation site, stable segment 8 ablation site enlargement of extracapsular lesion posteriorly, no new hepatic lesions, wall thickening/edema at the distal ileum CT 05/22/2018- 2 new lung nodules suspicious for metastases, stable segment 8 ablation zone, slight decrease in the size of delayed perfusion at the segment 6 ablation zone, posterior extracapsular lesion is slightly smaller CT chest 07/13/2018- multiple lung nodules consistent with metastatic disease Cycle 1 Xeloda 07/23/2018 Cycle 2 Xeloda 08/13/2018 Cycle 3 Xeloda 09/03/2018 Cycle 4 Xeloda 09/24/2018 Restaging chest CT 10/12/2018- scattered pulmonary nodules/metastases mildly improved. Cycle 5 Xeloda 10/15/2018 Cycle 6 Xeloda 11/05/2018 Cycle 7 Xeloda 11/26/2018 Cycle 8 Xeloda 12/17/2018 Cycle 9 Xeloda 01/07/2019 CT chest 01/24/2019- stable pulmonary nodules.  No new pulmonary nodules.  Stable left hepatic lobe cysts and right hepatic  lobe lesions.  No new hepatic lesions.  Stable right ninth and 10th rib lesions. Xeloda daily dosing schedule beginning 01/28/2019 due to hand-foot syndrome CT 05/27/2019- enlargement of lung metastases, similar right hepatic lobe lesion CT 08/05/2019-enlargement of bilateral lung nodules, no new lesions, stable right hepatic lesion Cycle 1 FOLFIRI 08/13/2019 Cycle 2 FOLFIRI 09/04/2019, Irinotecan dose reduced due to neutropenia Cycle 3 FOLFIRI 09/17/2019 Cycle 4 FOLFIRI 10/08/2019, 5-FU and irinotecan dose reduced CT chest 10/28/2019-bilateral pulmonary metastases with minimal improvement.  Stable ablation zone in the posterior right hepatic lobe. CT chest 03/03/2020-enlargement of pulmonary metastases, decreased right hepatic ablation site CT chest 07/07/2020-enlargement of pulmonary metastases, stable ablation site involving right hepatic lobe Cycle 1 FOLFIRI/Avastin 07/27/2020 Cycle 2 FOLFIRI/Avastin 08/18/2020 Treatment held 09/08/2020 due to neutropenia Cycle 3 FOLFIRI/Avastin 09/17/2020 (irinotecan dose reduced due to neutropenia) Cycle 4 FOLFIRI/Avastin 10/06/2020 Chemotherapy discontinued per patient request CT chest 11/16/2020-decrease in size of dominant right perihilar nodule, other lung nodules are stable, stable right liver ablation site CTs 07/28/2022-increase size of numerous pulmonary metastases, increased number and size of numerous bone lesions; mixed lucency and sclerosis throughout the T9 vertebral body, mild compression and approximately 20% loss of central vertebral body height most compatible with a metastatic lesion with pathologic compression fracture; expansile sclerotic lesions noted in the lateral aspect of the right ninth and 10th ribs as well as the anterolateral aspect of the left sixth rib which also has surrounding soft tissue component. Cycle 1 FOLFIRI/Avastin 08/09/2022 Cycle 2 FOLFIRI/Avastin 08/29/2022, Emend and prophylactic dexamethasone added No further chemotherapy  per  patient due to poor tolerance Right ovary cystadenofibroma, status post a bilateral oophorectomy 10/22/2013 Remote history of cervical cancer. Iron deficiency anemia-resolved. Right calf deep vein thrombosis 10/24/2013-she completed 3 months of xarelto. Indeterminate 12 mm hypermetabolic right pelvic density on the staging PET scan 10/04/2013-CT followup recommended per the GI tumor conference 11/13/2013. No longer visualized on a CT 07/14/2014. Soft tissue implant overlying the posterior right liver on the CT 09/20/2013-not hypermetabolic on the PET scan 10/04/2013. C. difficile colitis 03/05/2014. She completed a course of Flagyl, recurrent diarrhea beginning 03/21/2014, positive C. difficile toxin 03/26/2014-resumed Flagyl for planned 10 day course. Vancomycin beginning 04/08/2014. Taper complete 05/21/2014.   9.  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  Xeloda dose/schedule adjusted beginning 01/28/2019. 10.  CVA-left centrum semiovale and left caudate 12/14/2021 11.  03/21/2022-implantation of loop recorder 12.  Fall 10/06/2022 with right forehead laceration 13.  01/26/2023-back pain/new onset leg weakness-preliminary review of CT thoracic spine consistent with a destructive process at T9      Disposition: Cynthia [name] has metastatic colon cancer.  She is currently maintained off of treatment.  She has indicated repeatedly she does not wish to consider further systemic therapy.  She presents today with new onset back pain and leg weakness.  Her symptoms appear to be related to a metastasis at the lower thoracic spine.  I reviewed the CT images from 07/28/2022 and today with Ms. Pall.  I explained the chance of developing progressive leg weakness and cord compression syndromes in the absence of treatment.  She does not wish to consider surgery, but will agree to a course of palliative radiation.  I contacted Dr. Mitzi Hansen and he will see her to begin radiation to  the thoracic spine.  Ms. Claes will begin Decadron today.  She will call for progressive leg weakness or new symptoms.  She requested a referral for home hospice care.  We made a referral to [facility].  We discussed CPR and ACLS.  She did not make a decision on CODE STATUS today.  She will be scheduled for follow-up appointment in approximately 2 weeks.  Thornton Papas, MD  01/26/2023  11:16 AM

## 2023-01-27 ENCOUNTER — Other Ambulatory Visit: Payer: Self-pay

## 2023-01-27 ENCOUNTER — Ambulatory Visit
Admission: RE | Admit: 2023-01-27 | Discharge: 2023-01-27 | Disposition: A | Discharge status: Home / Self Care | Payer: Medicare PPO | Source: Ambulatory Visit | Attending: Radiation Oncology | Admitting: Radiation Oncology

## 2023-01-27 ENCOUNTER — Encounter: Payer: Self-pay | Admitting: Cardiology

## 2023-01-27 DIAGNOSIS — C787 Secondary malignant neoplasm of liver and intrahepatic bile duct: Secondary | ICD-10-CM | POA: Diagnosis not present

## 2023-01-27 DIAGNOSIS — C182 Malignant neoplasm of ascending colon: Secondary | ICD-10-CM | POA: Diagnosis not present

## 2023-01-27 DIAGNOSIS — Z510 Encounter for antineoplastic radiation therapy: Secondary | ICD-10-CM | POA: Diagnosis not present

## 2023-01-27 DIAGNOSIS — C7951 Secondary malignant neoplasm of bone: Secondary | ICD-10-CM | POA: Diagnosis not present

## 2023-01-27 DIAGNOSIS — C189 Malignant neoplasm of colon, unspecified: Secondary | ICD-10-CM

## 2023-01-27 DIAGNOSIS — C180 Malignant neoplasm of cecum: Secondary | ICD-10-CM | POA: Diagnosis not present

## 2023-01-27 LAB — RAD ONC ARIA SESSION SUMMARY
Course Elapsed Days: 0
Plan Fractions Treated to Date: 1
Plan Prescribed Dose Per Fraction: 3 Gy
Plan Total Fractions Prescribed: 10
Plan Total Prescribed Dose: 30 Gy
Reference Point Dosage Given to Date: 3 Gy
Reference Point Session Dosage Given: 3 Gy
Session Number: 1

## 2023-01-27 MED ORDER — SONAFINE EX EMUL
1.0000 | Freq: Two times a day (BID) | CUTANEOUS | Status: DC
  Administered 2023-01-27: 1 via TOPICAL

## 2023-01-27 NOTE — Progress Notes (Addendum)
Sondra Come, RN sent to [facility] Device Notice of Potential Device Damage  Patient Name: Cynthia [name]  Date of Birth:  Aug 11, 1936  Device Information:  Loop-recorder: Implant Date: [id number] Pulse Gen Model: [id number] II Pulse Generator Manufacturer: [employer] Implantable Pulse Generator Type: ICM/ILR Pulse Gen Serial Number: [id number]  Programmable hepatic pump: n/a  Intrathecal pain pump: n/a  Neurostimulator: n/a   The patient listed above is scheduled to receive radiation therapy, which may adversely affect the device listed above. Every reasonable effort will be made to minimize the radiation dose to the device, including exposure to neutrons. Though unlikely, possible damage to the battery and electronic components, such as memory storage and programming, is possible.  Your office may consider preserving valuable patient monitoring information stored on the device prior to the start of radiation therapy and checking the device programming after the patient completes radiation therapy.   TO BE COMPLETED BY RADIATION ONCOLOGIST'S OFFICE  Scheduled start date of radiation therapy: 01/27/2023  Scheduled end date of radiation therapy: 02/08/2023  Site to be treated: T Spine  Radiation Oncologist: Dr. Mitzi Hansen   TO BE COMPLETED BY RECIPIENT'S OFFICE  **By signing you are indicating that you've received this notice and no further action is needed:  Physician's signature and date:__Cameron Lalla Brothers, MD  01/27/2023 **Please call [phone] if you have questions for Radiation Oncologist and are unable to sign form  Fax form back to [phone]

## 2023-01-27 NOTE — Progress Notes (Signed)
Notice of Potential Device Damage  Patient Name: Cynthia [name]  Date of Birth:  16-Feb-1936  Device Information:  Loop-recorder: Implant Date: [id number] Pulse Gen Model: [id number] II Pulse Generator Manufacturer: [employer] Implantable Pulse Generator Type: ICM/ILR Pulse Gen Serial Number: [id number]  Programmable hepatic pump: n/a  Intrathecal pain pump: n/a  Neurostimulator: n/a   The patient listed above is scheduled to receive radiation therapy, which may adversely affect the device listed above. Every reasonable effort will be made to minimize the radiation dose to the device, including exposure to neutrons. Though unlikely, possible damage to the battery and electronic components, such as memory storage and programming, is possible.  Your office may consider preserving valuable patient monitoring information stored on the device prior to the start of radiation therapy and checking the device programming after the patient completes radiation therapy.   TO BE COMPLETED BY RADIATION ONCOLOGIST'S OFFICE  Scheduled start date of radiation therapy: 01/27/2023  Scheduled end date of radiation therapy: 02/08/2023  Site to be treated: T Spine  Radiation Oncologist: Dr. Mitzi Hansen   TO BE COMPLETED BY RECIPIENT'S OFFICE  **By signing you are indicating that you've received this notice and no further action is needed:  Physician's signature and date:__Cameron Lalla Brothers, MD  01/27/2023 **Please call [phone] if you have questions for Radiation Oncologist and are unable to sign form  Fax form back to [phone]

## 2023-01-28 ENCOUNTER — Ambulatory Visit
Admission: RE | Admit: 2023-01-28 | Discharge: 2023-01-28 | Disposition: A | Discharge status: Home / Self Care | Payer: Medicare PPO | Source: Ambulatory Visit | Attending: Radiation Oncology | Admitting: Radiation Oncology

## 2023-01-28 ENCOUNTER — Other Ambulatory Visit: Payer: Self-pay

## 2023-01-28 DIAGNOSIS — Z510 Encounter for antineoplastic radiation therapy: Secondary | ICD-10-CM | POA: Diagnosis not present

## 2023-01-28 DIAGNOSIS — C787 Secondary malignant neoplasm of liver and intrahepatic bile duct: Secondary | ICD-10-CM | POA: Diagnosis not present

## 2023-01-28 DIAGNOSIS — C182 Malignant neoplasm of ascending colon: Secondary | ICD-10-CM | POA: Diagnosis not present

## 2023-01-28 LAB — RAD ONC ARIA SESSION SUMMARY
Course Elapsed Days: 1
Plan Fractions Treated to Date: 2
Plan Prescribed Dose Per Fraction: 3 Gy
Plan Total Fractions Prescribed: 10
Plan Total Prescribed Dose: 30 Gy
Reference Point Dosage Given to Date: 6 Gy
Reference Point Session Dosage Given: 3 Gy
Session Number: 2

## 2023-01-30 ENCOUNTER — Other Ambulatory Visit: Payer: Self-pay

## 2023-01-30 ENCOUNTER — Ambulatory Visit
Admission: RE | Admit: 2023-01-30 | Discharge: 2023-01-30 | Disposition: A | Discharge status: Home / Self Care | Payer: Medicare PPO | Source: Ambulatory Visit | Attending: Radiation Oncology | Admitting: Radiation Oncology

## 2023-01-30 DIAGNOSIS — C787 Secondary malignant neoplasm of liver and intrahepatic bile duct: Secondary | ICD-10-CM | POA: Diagnosis not present

## 2023-01-30 DIAGNOSIS — Z510 Encounter for antineoplastic radiation therapy: Secondary | ICD-10-CM | POA: Diagnosis not present

## 2023-01-30 DIAGNOSIS — C182 Malignant neoplasm of ascending colon: Secondary | ICD-10-CM | POA: Diagnosis not present

## 2023-01-30 LAB — RAD ONC ARIA SESSION SUMMARY
Course Elapsed Days: 3
Plan Fractions Treated to Date: 3
Plan Prescribed Dose Per Fraction: 3 Gy
Plan Total Fractions Prescribed: 10
Plan Total Prescribed Dose: 30 Gy
Reference Point Dosage Given to Date: 9 Gy
Reference Point Session Dosage Given: 3 Gy
Session Number: 3

## 2023-01-31 ENCOUNTER — Ambulatory Visit
Admission: RE | Admit: 2023-01-31 | Discharge: 2023-01-31 | Disposition: A | Discharge status: Home / Self Care | Payer: Medicare PPO | Source: Ambulatory Visit | Attending: Radiation Oncology | Admitting: Radiation Oncology

## 2023-01-31 ENCOUNTER — Other Ambulatory Visit: Payer: Self-pay

## 2023-01-31 DIAGNOSIS — C182 Malignant neoplasm of ascending colon: Secondary | ICD-10-CM | POA: Diagnosis not present

## 2023-01-31 DIAGNOSIS — C787 Secondary malignant neoplasm of liver and intrahepatic bile duct: Secondary | ICD-10-CM | POA: Diagnosis not present

## 2023-01-31 DIAGNOSIS — Z510 Encounter for antineoplastic radiation therapy: Secondary | ICD-10-CM | POA: Diagnosis not present

## 2023-01-31 LAB — RAD ONC ARIA SESSION SUMMARY
Course Elapsed Days: 4
Plan Fractions Treated to Date: 4
Plan Prescribed Dose Per Fraction: 3 Gy
Plan Total Fractions Prescribed: 10
Plan Total Prescribed Dose: 30 Gy
Reference Point Dosage Given to Date: 12 Gy
Reference Point Session Dosage Given: 3 Gy
Session Number: 4

## 2023-02-01 ENCOUNTER — Ambulatory Visit
Admission: RE | Admit: 2023-02-01 | Discharge: 2023-02-01 | Disposition: A | Discharge status: Home / Self Care | Payer: Medicare PPO | Source: Ambulatory Visit | Attending: Radiation Oncology | Admitting: Radiation Oncology

## 2023-02-01 ENCOUNTER — Other Ambulatory Visit: Payer: Self-pay

## 2023-02-01 DIAGNOSIS — C182 Malignant neoplasm of ascending colon: Secondary | ICD-10-CM | POA: Diagnosis not present

## 2023-02-01 DIAGNOSIS — Z510 Encounter for antineoplastic radiation therapy: Secondary | ICD-10-CM | POA: Diagnosis not present

## 2023-02-01 DIAGNOSIS — C787 Secondary malignant neoplasm of liver and intrahepatic bile duct: Secondary | ICD-10-CM | POA: Diagnosis not present

## 2023-02-01 LAB — RAD ONC ARIA SESSION SUMMARY
Course Elapsed Days: 5
Plan Fractions Treated to Date: 5
Plan Prescribed Dose Per Fraction: 3 Gy
Plan Total Fractions Prescribed: 10
Plan Total Prescribed Dose: 30 Gy
Reference Point Dosage Given to Date: 15 Gy
Reference Point Session Dosage Given: 3 Gy
Session Number: 5

## 2023-02-02 ENCOUNTER — Other Ambulatory Visit: Payer: Self-pay

## 2023-02-02 ENCOUNTER — Ambulatory Visit
Admission: RE | Admit: 2023-02-02 | Discharge: 2023-02-02 | Disposition: A | Discharge status: Home / Self Care | Payer: Medicare PPO | Source: Ambulatory Visit | Attending: Radiation Oncology | Admitting: Radiation Oncology

## 2023-02-02 DIAGNOSIS — C7951 Secondary malignant neoplasm of bone: Secondary | ICD-10-CM | POA: Diagnosis not present

## 2023-02-02 DIAGNOSIS — C180 Malignant neoplasm of cecum: Secondary | ICD-10-CM | POA: Diagnosis not present

## 2023-02-02 DIAGNOSIS — Z510 Encounter for antineoplastic radiation therapy: Secondary | ICD-10-CM | POA: Diagnosis not present

## 2023-02-02 DIAGNOSIS — C182 Malignant neoplasm of ascending colon: Secondary | ICD-10-CM | POA: Diagnosis not present

## 2023-02-02 DIAGNOSIS — C787 Secondary malignant neoplasm of liver and intrahepatic bile duct: Secondary | ICD-10-CM | POA: Diagnosis not present

## 2023-02-02 LAB — RAD ONC ARIA SESSION SUMMARY
Course Elapsed Days: 6
Plan Fractions Treated to Date: 6
Plan Prescribed Dose Per Fraction: 3 Gy
Plan Total Fractions Prescribed: 10
Plan Total Prescribed Dose: 30 Gy
Reference Point Dosage Given to Date: 18 Gy
Reference Point Session Dosage Given: 3 Gy
Session Number: 6

## 2023-02-03 ENCOUNTER — Other Ambulatory Visit: Payer: Self-pay

## 2023-02-03 ENCOUNTER — Telehealth: Payer: Self-pay | Admitting: Nurse Practitioner

## 2023-02-03 ENCOUNTER — Ambulatory Visit
Admission: RE | Admit: 2023-02-03 | Discharge: 2023-02-03 | Disposition: A | Discharge status: Home / Self Care | Payer: Medicare PPO | Source: Ambulatory Visit | Attending: Radiation Oncology | Admitting: Radiation Oncology

## 2023-02-03 DIAGNOSIS — C787 Secondary malignant neoplasm of liver and intrahepatic bile duct: Secondary | ICD-10-CM | POA: Diagnosis not present

## 2023-02-03 DIAGNOSIS — C182 Malignant neoplasm of ascending colon: Secondary | ICD-10-CM | POA: Diagnosis not present

## 2023-02-03 DIAGNOSIS — Z510 Encounter for antineoplastic radiation therapy: Secondary | ICD-10-CM | POA: Diagnosis not present

## 2023-02-03 LAB — RAD ONC ARIA SESSION SUMMARY
Course Elapsed Days: 7
Plan Fractions Treated to Date: 7
Plan Prescribed Dose Per Fraction: 3 Gy
Plan Total Fractions Prescribed: 10
Plan Total Prescribed Dose: 30 Gy
Reference Point Dosage Given to Date: 21 Gy
Reference Point Session Dosage Given: 3 Gy
Session Number: 7

## 2023-02-03 NOTE — Telephone Encounter (Signed)
Patient daughter called to change day and appointment time due to her mother having RADONC.

## 2023-02-06 ENCOUNTER — Other Ambulatory Visit: Payer: Self-pay

## 2023-02-06 ENCOUNTER — Ambulatory Visit
Admission: RE | Admit: 2023-02-06 | Discharge: 2023-02-06 | Disposition: A | Discharge status: Home / Self Care | Payer: Medicare PPO | Source: Ambulatory Visit | Attending: Radiation Oncology | Admitting: Radiation Oncology

## 2023-02-06 DIAGNOSIS — C787 Secondary malignant neoplasm of liver and intrahepatic bile duct: Secondary | ICD-10-CM | POA: Diagnosis not present

## 2023-02-06 DIAGNOSIS — C182 Malignant neoplasm of ascending colon: Secondary | ICD-10-CM | POA: Diagnosis not present

## 2023-02-06 DIAGNOSIS — Z510 Encounter for antineoplastic radiation therapy: Secondary | ICD-10-CM | POA: Diagnosis not present

## 2023-02-06 LAB — RAD ONC ARIA SESSION SUMMARY
Course Elapsed Days: 10
Plan Fractions Treated to Date: 8
Plan Prescribed Dose Per Fraction: 3 Gy
Plan Total Fractions Prescribed: 10
Plan Total Prescribed Dose: 30 Gy
Reference Point Dosage Given to Date: 24 Gy
Reference Point Session Dosage Given: 3 Gy
Session Number: 8

## 2023-02-07 ENCOUNTER — Other Ambulatory Visit: Payer: Self-pay

## 2023-02-07 ENCOUNTER — Ambulatory Visit
Admission: RE | Admit: 2023-02-07 | Discharge: 2023-02-07 | Disposition: A | Discharge status: Home / Self Care | Payer: Medicare PPO | Source: Ambulatory Visit | Attending: Radiation Oncology | Admitting: Radiation Oncology

## 2023-02-07 DIAGNOSIS — C787 Secondary malignant neoplasm of liver and intrahepatic bile duct: Secondary | ICD-10-CM | POA: Diagnosis not present

## 2023-02-07 DIAGNOSIS — C182 Malignant neoplasm of ascending colon: Secondary | ICD-10-CM | POA: Diagnosis not present

## 2023-02-07 DIAGNOSIS — Z510 Encounter for antineoplastic radiation therapy: Secondary | ICD-10-CM | POA: Diagnosis not present

## 2023-02-07 LAB — RAD ONC ARIA SESSION SUMMARY
Course Elapsed Days: 11
Plan Fractions Treated to Date: 9
Plan Prescribed Dose Per Fraction: 3 Gy
Plan Total Fractions Prescribed: 10
Plan Total Prescribed Dose: 30 Gy
Reference Point Dosage Given to Date: 27 Gy
Reference Point Session Dosage Given: 3 Gy
Session Number: 9

## 2023-02-08 ENCOUNTER — Ambulatory Visit
Admission: RE | Admit: 2023-02-08 | Discharge: 2023-02-08 | Disposition: A | Discharge status: Home / Self Care | Payer: Medicare PPO | Source: Ambulatory Visit | Attending: Radiation Oncology | Admitting: Radiation Oncology

## 2023-02-08 ENCOUNTER — Other Ambulatory Visit: Payer: Self-pay

## 2023-02-08 ENCOUNTER — Inpatient Hospital Stay: Payer: Medicare PPO | Admitting: Nurse Practitioner

## 2023-02-08 DIAGNOSIS — Z510 Encounter for antineoplastic radiation therapy: Secondary | ICD-10-CM | POA: Diagnosis not present

## 2023-02-08 DIAGNOSIS — C787 Secondary malignant neoplasm of liver and intrahepatic bile duct: Secondary | ICD-10-CM | POA: Diagnosis not present

## 2023-02-08 DIAGNOSIS — C182 Malignant neoplasm of ascending colon: Secondary | ICD-10-CM | POA: Diagnosis not present

## 2023-02-08 LAB — RAD ONC ARIA SESSION SUMMARY
Course Elapsed Days: 12
Plan Fractions Treated to Date: 10
Plan Prescribed Dose Per Fraction: 3 Gy
Plan Total Fractions Prescribed: 10
Plan Total Prescribed Dose: 30 Gy
Reference Point Dosage Given to Date: 30 Gy
Reference Point Session Dosage Given: 3 Gy
Session Number: 10

## 2023-02-09 ENCOUNTER — Ambulatory Visit: Payer: Medicare PPO

## 2023-02-09 NOTE — Radiation Completion Notes (Addendum)
  Radiation Oncology         [phone] ________________________________  Name: Cynthia [name] MRN: [id number]  Date of Service: 02/08/2023  DOB: 1936-05-05  End of Treatment Note    Diagnosis:   Stage IV colon cancer with metastasis to the thoracic spine.   Intent: Palliative     ==========DELIVERED PLANS==========  First Treatment Date: 2023-01-27 - Last Treatment Date: 2023-02-08   Plan Name: Spine_T Site: Thoracic Spine Technique: Isodose Plan Mode: Photon Dose Per Fraction: 3 Gy Prescribed Dose (Delivered / Prescribed): 30 Gy / 30 Gy Prescribed Fxs (Delivered / Prescribed): 10 / 10     ==========ON TREATMENT VISIT DATES========== 2023-01-27, 2023-02-03   See weekly On Treatment Notes in Epic for details. The patient tolerated radiation. She developed fatigue and anticipated skin changes in the treatment field. Her pain has improved significantly.  The patient will receive a call in about one month from the radiation oncology department. She will continue follow up with Dr. Truett Perna as well.      Osker Mason, PAC

## 2023-02-10 DIAGNOSIS — C189 Malignant neoplasm of colon, unspecified: Secondary | ICD-10-CM | POA: Insufficient documentation

## 2023-02-10 NOTE — Progress Notes (Signed)
Radiation Oncology         [phone] ________________________________  Name: Cynthia [name]        MRN: [id number]  Date of Service: 01/27/2023 DOB: [birth year]  NW:GNFAOZH, Jasmine December, MD  Mila Palmer, MD     REFERRING PHYSICIAN: Mila [facility], MD   DIAGNOSIS: The encounter diagnosis was Colon cancer metastasized to bone [facility]).   C79.51   HISTORY OF PRESENT ILLNESS: Cynthia [name] is a 87 y.o. female seen at the request of Dr. Truett Perna. She has metastatic colon cancer that is currently maintained off treatment. She does not wish to consider further systemic care.  She most recently presented to Dr. Truett Perna for new back pain an leg weakness. CT of the thoracic spine on 01/26/23 showed destruction of the T9 vertebral body that has progressed since 07/28/22; extraosseous extension of the tumor into the surrounding paraspinal soft tissues; worsening epidural tumor in the spinal canal resulting in severe spinal canal stenosis with likely cord compression; tumor likely involves the bilateral neural foramina affecting the exiting T9 nerve roots; possible pathologic fractures through the bilateral lamina/inferior articulating processes at T9; interval increase in size of the right infrahilar mass and additional metastases in both upper lobes; new small right pleural effusion.   Dr. Truett Perna reviewed these images with the patient. She does not wish to consider surgery, but was willing to consider radiation.  She was kindly referred to [place] today to discuss radiation therapy.  Today, she reports progressive leg weakness. She is using a wheelchair to ambulate. She endorses mid back pain that has been on going for several weeks.    PREVIOUS RADIATION THERAPY: Yes   Indication for treatment:  Curative        Radiation treatment dates:   35/19, 10/19/17, 10/23/17, 10/25/17 & 10/27/17   Site/dose:   Liver SBRT treated to 50 Gy in 5 fractions of 10 Gy each   PAST MEDICAL HISTORY:  Past  Medical History:  Diagnosis Date   Anemia    Blood transfusion without reported diagnosis jan 2015   C. difficile colitis 03/05/14, 03/22/14, 04/05/14   recurrent   Cancer (HCC)    liver   Cervical cancer (HCC) 1972   Colon cancer (HCC) JAN 2015   Colon cancer (HCC)    Complication of anesthesia    slow to awaken in past, DONE WELL RECENTLY   DVT (deep venous thrombosis) (HCC) 10/24/13   Right leg   Guillain-Barre (HCC) 09/2015   Heart murmur    Hypertension    no bp meds    Liver cyst    Stroke (HCC) 12/2014   NUMBNESS ON LEFT SIDE   Tinnitus of both ears ALL THE TIME       PAST SURGICAL HISTORY: Past Surgical History:  Procedure Laterality Date   ABDOMINAL HYSTERECTOMY     vaginal- partial   BACK SURGERY  1981   lower lumbar   ESOPHAGOGASTRODUODENOSCOPY N/A 10/14/2015   Procedure: ESOPHAGOGASTRODUODENOSCOPY (EGD);  Surgeon: Vida Rigger, MD;  Location: [facility] ENDOSCOPY;  Service: Endoscopy;  Laterality: N/A;  Bedside in ICU   IR CV LINE INJECTION  07/20/2020   IR GENERIC HISTORICAL  09/07/2016   IR RADIOLOGIST EVAL & MGMT 09/07/2016 Irish Lack, MD [facility] INTERV RAD   IR IMAGING GUIDED PORT INSERTION  07/29/2019   LAPAROSCOPIC RIGHT COLECTOMY Right 10/22/2013   Procedure: LAPAROSCOPIC RIGHT COLECTOMY ;  Surgeon: Ernestene Mention, MD;  Location: [facility] ORS;  Service: General;  Laterality: Right;   SALPINGOOPHORECTOMY  10/22/2013   Procedure: Ulyses Amor OOPHORECTOMY;  Surgeon: Bernita Buffy. Duard Brady, MD;  Location: [facility] ORS;  Service: Gynecology;;   TUBAL LIGATION  1968     FAMILY HISTORY:  Family History  Problem Relation Age of Onset   Rheum arthritis Mother    Heart failure Father    Diabetes Sister    Cancer Sister        leukemia   Diabetes Brother    Heart failure Brother    Hypertension Brother    Breast cancer Maternal Aunt    Cancer Maternal Grandmother        GIST   Heart failure Maternal Grandfather    Aneurysm Paternal Grandmother      SOCIAL HISTORY:  reports  that she has never smoked. She has never used smokeless tobacco. She reports that she does not drink alcohol and does not use drugs.   ALLERGIES: Morphine and codeine   MEDICATIONS:  Current Outpatient Medications  Medication Sig Dispense Refill   acetaminophen (TYLENOL) 500 MG tablet Take 1,000 mg by mouth every 6 (six) hours as needed for mild pain.      atorvastatin (LIPITOR) 40 MG tablet TAKE 1 TABLET(40 MG) BY MOUTH DAILY 30 tablet 1   Cholecalciferol (VITAMIN D3) 25 MCG (1000 UT) CAPS 1 capsule     clopidogrel (PLAVIX) 75 MG tablet 1 tablet Orally Once a day     dexamethasone (DECADRON) 4 MG tablet Take 1 tablet (4 mg total) by mouth 2 (two) times daily with a meal. 28 tablet 0   gabapentin (NEURONTIN) 400 MG capsule TAKE 1 CAPSULE BY MOUTH THREE TIMES DAILY. MAY TAKE ADDITIONAL DOSE AT NIGHT AS NEEDED 100 capsule 5   lidocaine-prilocaine (EMLA) cream Apply 1 application topically as directed. Apply to port site 1 hour prior to stick and cover with plastic wrap 30 g 2   loperamide (IMODIUM) 2 MG capsule Take 2-4 mg by mouth as needed for diarrhea or loose stools.     metoprolol succinate (TOPROL-XL) 100 MG 24 hr tablet Take 100 mg by mouth every evening.     ondansetron (ZOFRAN) 8 MG tablet Take 1 tablet (8 mg total) by mouth every 8 (eight) hours as needed for nausea or vomiting. (Patient not taking: Reported on 01/26/2023) 30 tablet 1   prochlorperazine (COMPAZINE) 5 MG tablet Take 1 tablet (5 mg total) by mouth every 6 (six) hours as needed for nausea or vomiting. (Patient not taking: Reported on 01/26/2023) 30 tablet 0   traMADol (ULTRAM) 50 MG tablet Take 1 tablet (50 mg total) by mouth every 8 (eight) hours as needed. 60 tablet 0   vitamin B-12 (CYANOCOBALAMIN) 1000 MCG tablet 1 tablet     No current facility-administered medications for this encounter.   Facility-Administered Medications Ordered in Other Encounters  Medication Dose Route Frequency Provider Last Rate Last Admin    heparin lock flush 100 unit/mL  500 Units Intracatheter Once PRN Ladene Artist, MD       sodium chloride flush (NS) 0.9 % injection 10 mL  10 mL Intracatheter PRN Ladene Artist, MD       sodium chloride flush (NS) 0.9 % injection 10 mL  10 mL Intracatheter PRN Ladene Artist, MD   10 mL at 05/24/21 0916     REVIEW OF SYSTEMS: Notable for that above.      PHYSICAL EXAM:  Wt Readings from Last 3 Encounters:  01/26/23 106 lb 9.6 oz (48.4 kg)  01/20/23 107 lb (48.5  kg)  11/14/22 113 lb (51.3 kg)   Temp Readings from Last 3 Encounters:  01/26/23 98.2 F (36.8 C) (Oral)  11/14/22 98.1 F (36.7 C) (Oral)  10/10/22 98.2 F (36.8 C) (Oral)   BP Readings from Last 3 Encounters:  01/26/23 116/67  01/20/23 122/66  11/14/22 123/79   Pulse Readings from Last 3 Encounters:  01/26/23 67  01/20/23 (!) 50  11/14/22 71    /10  In general this is a well appearing female in no acute distress. She's alert and oriented x4 and appropriate throughout the examination. Cardiopulmonary assessment is negative for acute distress and she exhibits normal effort.     ECOG = 2  0 - Asymptomatic (Fully active, able to carry on all predisease activities without restriction)  1 - Symptomatic but completely ambulatory (Restricted in physically strenuous activity but ambulatory and able to carry out work of a light or sedentary nature. For example, light housework, office work)  2 - Symptomatic, <50% in bed during the day (Ambulatory and capable of all self care but unable to carry out any work activities. Up and about more than 50% of waking hours)  3 - Symptomatic, >50% in bed, but not bedbound (Capable of only limited self-care, confined to bed or chair 50% or more of waking hours)  4 - Bedbound (Completely disabled. Cannot carry on any self-care. Totally confined to bed or chair)  5 - Death   Santiago Glad MM, Creech RH, Tormey DC, et al. [phone]). "Toxicity and response criteria of the [facility]". Am. Evlyn Clines. Oncol. 5 (6): 649-55    LABORATORY DATA:  Lab Results  Component Value Date   WBC 8.6 09/20/2022   HGB 10.4 (L) 09/20/2022   HCT 32.8 (L) 09/20/2022   MCV 85.4 09/20/2022   PLT 312 09/20/2022   Lab Results  Component Value Date   NA 139 09/20/2022   K 3.9 09/20/2022   CL 102 09/20/2022   CO2 28 09/20/2022   Lab Results  Component Value Date   ALT 21 09/20/2022   AST 23 09/20/2022   ALKPHOS 54 09/20/2022   BILITOT 0.3 09/20/2022      RADIOGRAPHY: CT Thoracic Spine Wo Contrast  Result Date: 01/26/2023 CLINICAL DATA:  Back pain, history of colon cancer. EXAM: CT THORACIC SPINE WITHOUT CONTRAST TECHNIQUE: Multidetector CT images of the thoracic were obtained using the standard protocol without intravenous contrast. RADIATION DOSE REDUCTION: This exam was performed according to the departmental dose-optimization program which includes automated exposure control, adjustment of the mA and/or kV according to patient size and/or use of iterative reconstruction technique. COMPARISON:  CT chest/abdomen/pelvis 07/28/2022 FINDINGS: Alignment: Normal. Vertebrae: There is permeative destruction of the entirety of the T9 vertebral body extending into the pedicles, superior and inferior articulating processes, and spinous process. The destruction of the posterior elements has progressed since 07/28/2022. There is extraosseous extension of tumor into the surrounding paraspinal soft tissues as well as epidural tumor in the spinal canal measuring at least 6 mm in thickness (6-36, 4-105). Extraosseous tumor has also progressed since the prior study. The epidural tumor resulting in severe spinal canal stenosis with likely cord compression. Tumor also likely involves the bilateral neural foramina affecting the exiting T9 nerve roots. There are possible pathologic fractures through the bilateral lamina/inferior articulating processes (6-41, 6-29). Mild compression  deformity of the T9 vertebral body is unchanged. There is no other definite evidence of osseous metastatic disease in the thoracic spine. Paraspinal and other soft  tissues: As above, there is extraosseous tumor at T9 which is progressed since the prior CT chest/abdomen/pelvis. The large right infrahilar mass measures up to 8.5 cm AP, previously measured up to 7.0 cm. Additional metastatic lesions in the right upper lobe measuring 3.1 cm x 2.8 cm and 3.2 cm x 2.2 cm, and in the left upper lobe measuring up to 2.3 cm also appear increased in size. There is a small right pleural effusion which is new since the prior study. A right renal cyst is unchanged. Disc levels: There is overall mild degenerative change of the thoracic spine. There is no significant spinal canal or neural foraminal stenosis, aside from the findings at T9-T10 described above. IMPRESSION: 1. Permeative destruction of the entirety of the T9 vertebral body extending into the posterior elements has progressed since 07/28/2022. Extraosseous extension of tumor into the surrounding paraspinal soft tissues as well as epidural tumor in the spinal canal has also worsened, resulting in severe spinal canal stenosis with likely cord compression. Tumor also likely involves the bilateral neural foramina affecting the exiting T9 nerve roots. Recommend MRI to better evaluate cord compression. Spine surgery consult is also suggested. 2. Possible pathologic fractures through the bilateral lamina/inferior articulating processes at T9. 3. Interval increase in size of the right infrahilar mass and additional metastases in both upper lobes. 4. New small right pleural effusion. Electronically Signed   By: Lesia Hausen M.D.   On: 01/26/2023 13:48   CUP PACEART REMOTE DEVICE CHECK  Result Date: 01/24/2023 ILR summary report received. Battery status OK. Normal device function. No new symptom, tachy, brady, or pause episodes. No new AF episodes. Monthly summary reports  and ROV/PRN LA, CVRS      IMPRESSION/PLAN: 1. Stage IV colon cancer with metastasis to the thoracic spine.  Today we discussed the workup and natural course of bony metastasis, highlighting the role of radiotherapy in the management. Most recent imaging indicates disease spread to the lower thoracic spine with possibility of cord compression. Patient is experiencing leg weakness and back pain due to progression of disease. She is a good candidate for palliative radiotherapy to improve her symptoms and decrease risk of further disease spread. Accordingly, Dr. Mitzi Hansen recommends 30 Gy in 10 fractions to the spinal metastasis. We discussed the available radiation techniques, and focused on the details and logistics of delivery. We discussed and outlined the risks, benefits, short and long-term effects associated with radiotherapy. She was encouraged to ask questions that were answered to his stated satisfaction.   Patient expressed understanding of the treatment that is of palliative intent and would like to proceed with treatment. A consent form was signed today and placed in her chart today. She is scheduled for CT simulation later today. She will begin treatment tomorrow.    In a visit lasting 40 minutes, greater than 50% of the time was spent face to face discussing the patient's condition, in preparation for the discussion, and coordinating the patient's care.   The above documentation reflects my direct findings during this shared patient visit. Please see the separate note by Dr. Mitzi Hansen on this date for the remainder of the patient's plan of care.    Joyice Faster, PA-C    **Disclaimer: This note was dictated with voice recognition software. Similar sounding words can inadvertently be transcribed and this note may contain transcription errors which may not have been corrected upon publication of note.**

## 2023-02-15 ENCOUNTER — Inpatient Hospital Stay: Attending: Oncology | Admitting: Nurse Practitioner

## 2023-02-15 ENCOUNTER — Encounter: Payer: Self-pay | Admitting: Nurse Practitioner

## 2023-02-15 VITALS — BP 119/80 | HR 87 | Temp 98.1°F | Resp 20 | Ht 60.0 in | Wt 104.1 lb

## 2023-02-15 DIAGNOSIS — C787 Secondary malignant neoplasm of liver and intrahepatic bile duct: Secondary | ICD-10-CM | POA: Diagnosis not present

## 2023-02-15 DIAGNOSIS — C189 Malignant neoplasm of colon, unspecified: Secondary | ICD-10-CM | POA: Diagnosis not present

## 2023-02-15 DIAGNOSIS — C7800 Secondary malignant neoplasm of unspecified lung: Secondary | ICD-10-CM | POA: Insufficient documentation

## 2023-02-15 DIAGNOSIS — Z923 Personal history of irradiation: Secondary | ICD-10-CM | POA: Diagnosis not present

## 2023-02-15 DIAGNOSIS — C180 Malignant neoplasm of cecum: Secondary | ICD-10-CM | POA: Diagnosis present

## 2023-02-15 NOTE — Progress Notes (Signed)
[facility] Summary Report / Loop Recorder 

## 2023-02-15 NOTE — Progress Notes (Signed)
[facility] OFFICE PROGRESS NOTE   Diagnosis: Colon cancer  INTERVAL HISTORY:   Ms. Gerner returns for follow-up.  She completed the course of radiation on 02/08/2023.  She is no longer having back pain.  Leg weakness is better.  She can walk a few steps independently, does well with assistance.  She is enrolled in the hospice program.  Objective:  Vital signs in last 24 hours:  Blood pressure 119/80, pulse 87, temperature 98.1 F (36.7 C), temperature source Oral, resp. rate 20, height 5' (1.524 m), weight 104 lb 1.6 oz (47.2 kg), SpO2 98 %.    Lymphatics: No palpable cervical or supraclavicular lymph nodes. Resp: Lungs clear bilaterally. Cardio: Regular rate and rhythm. GI: No hepatosplenomegaly. Vascular: No leg edema. Neuro: Alert and oriented.  Lower extremity motor strength overall appears intact, maybe slight weakness at the right foot. Port-A-Cath without erythema.   Lab Results:  Lab Results  Component Value Date   WBC 8.6 09/20/2022   HGB 10.4 (L) 09/20/2022   HCT 32.8 (L) 09/20/2022   MCV 85.4 09/20/2022   PLT 312 09/20/2022   NEUTROABS 6.1 09/20/2022    Imaging:  No results found.  Medications: I have reviewed the patient's current medications.  Assessment/Plan: Moderately differentiated adenocarcinoma of the cecum, stage III (T4a, N1), status post a laparoscopic assisted right colectomy 10/22/2013 The tumor returned microsatellite stable with normal mismatch repair protein expression   Cycle 1 adjuvant Xeloda 12/25/2013.   Cycle 2 adjuvant Xeloda 01/15/2014.   Cycle 3 adjuvant Xeloda 02/05/2014.   Cycle 4 adjuvant Xeloda 03/18/2014, discontinued after 4 days secondary to diarrhea   Cycle 5 adjuvant Xeloda 04/16/2014, Xeloda dose reduced to 1000 mg in the a.m. and 500 mg in the p.m.   Cycle 6 adjuvant Xeloda 05/07/2014. Cycle 7 adjuvant Xeloda 05/28/2014. Cycle 8 adjuvant Xeloda 06/18/2014. Restaging CTs on 07/14/2014 with no  evidence of metastatic colon cancer Colonoscopy 10/02/2014 with diverticulosis in the sigmoid colon and in the descending colon. One 4 mm polyp in the sigmoid colon with complete resection. Polyp tissue not retrieved. Internal hemorrhoids. Next colonoscopy in 3 years for surveillance. Restaging CTs 07/16/2015 with 2 new hypodense liver lesions concerning for metastases, no other evidence of disease progression PET scan 07/28/2015 with a single hypermetabolic right liver lesion, no other evidence of metastatic disease Ultrasound-guided biopsy of the right liver lesion 07/30/2015 confirmed metastatic colon cancer, MSS, tumor mutation burden 1, KRAS G12D, PIK3CA, PTEN MRI abdomen 08/20/2015 with similar to slight decrease in size of 2 hepatic metastasis. No new liver lesions or extrahepatic metastatic disease identified. Radiofrequency ablation of 2 liver lesions 09/25/2015 MRI abdomen 02/02/2016-coagulative necrosis at the 2 ablated liver tumor sites, with no enhancement to suggest residual or recurrent liver tumor. No new liver masses or other findings of metastatic disease in the abdomen. MRI abdomen 08/10/2016 revealed stable ablation sites, no evidence of progressive metastatic disease Restaging CT 02/08/2017-stable treated liver lesions, no evidence of progressive metastatic disease, stable borderline enlarged right inguinal node  CEA with stable mild elevation 02/08/2017, 03/01/2017, 03/29/2017, 04/19/2017 CEA further elevated 08/21/2017 CTs 09/05/2017- possible local recurrence of tumor along the ablation site right hepatic lobe lesion.  Cephalad to the ablation site approximately 1.9 x 2.1 cm soft tissue mass progressive compared to the prior exam. Liver SBRT 10/17/2017, 10/19/2017, 10/23/2017, 10/25/2017, 10/27/2017 CT 02/19/2018- progressive ill-defined areas of low attenuation with enhancement surrounding the segment 6 ablation site, stable segment 8 ablation site enlargement of extracapsular lesion  posteriorly,  no new hepatic lesions, wall thickening/edema at the distal ileum CT 05/22/2018- 2 new lung nodules suspicious for metastases, stable segment 8 ablation zone, slight decrease in the size of delayed perfusion at the segment 6 ablation zone, posterior extracapsular lesion is slightly smaller CT chest 07/13/2018- multiple lung nodules consistent with metastatic disease Cycle 1 Xeloda 07/23/2018 Cycle 2 Xeloda 08/13/2018 Cycle 3 Xeloda 09/03/2018 Cycle 4 Xeloda 09/24/2018 Restaging chest CT 10/12/2018- scattered pulmonary nodules/metastases mildly improved. Cycle 5 Xeloda 10/15/2018 Cycle 6 Xeloda 11/05/2018 Cycle 7 Xeloda 11/26/2018 Cycle 8 Xeloda 12/17/2018 Cycle 9 Xeloda 01/07/2019 CT chest 01/24/2019- stable pulmonary nodules.  No new pulmonary nodules.  Stable left hepatic lobe cysts and right hepatic lobe lesions.  No new hepatic lesions.  Stable right ninth and 10th rib lesions. Xeloda daily dosing schedule beginning 01/28/2019 due to hand-foot syndrome CT 05/27/2019- enlargement of lung metastases, similar right hepatic lobe lesion CT 08/05/2019-enlargement of bilateral lung nodules, no new lesions, stable right hepatic lesion Cycle 1 FOLFIRI 08/13/2019 Cycle 2 FOLFIRI 09/04/2019, Irinotecan dose reduced due to neutropenia Cycle 3 FOLFIRI 09/17/2019 Cycle 4 FOLFIRI 10/08/2019, 5-FU and irinotecan dose reduced CT chest 10/28/2019-bilateral pulmonary metastases with minimal improvement.  Stable ablation zone in the posterior right hepatic lobe. CT chest 03/03/2020-enlargement of pulmonary metastases, decreased right hepatic ablation site CT chest 07/07/2020-enlargement of pulmonary metastases, stable ablation site involving right hepatic lobe Cycle 1 FOLFIRI/Avastin 07/27/2020 Cycle 2 FOLFIRI/Avastin 08/18/2020 Treatment held 09/08/2020 due to neutropenia Cycle 3 FOLFIRI/Avastin 09/17/2020 (irinotecan dose reduced due to neutropenia) Cycle 4 FOLFIRI/Avastin 10/06/2020 Chemotherapy discontinued per  patient request CT chest 11/16/2020-decrease in size of dominant right perihilar nodule, other lung nodules are stable, stable right liver ablation site CTs 07/28/2022-increase size of numerous pulmonary metastases, increased number and size of numerous bone lesions; mixed lucency and sclerosis throughout the T9 vertebral body, mild compression and approximately 20% loss of central vertebral body height most compatible with a metastatic lesion with pathologic compression fracture; expansile sclerotic lesions noted in the lateral aspect of the right ninth and 10th ribs as well as the anterolateral aspect of the left sixth rib which also has surrounding soft tissue component. Cycle 1 FOLFIRI/Avastin 08/09/2022 Cycle 2 FOLFIRI/Avastin 08/29/2022, Emend and prophylactic dexamethasone added No further chemotherapy per patient due to poor tolerance Right ovary cystadenofibroma, status post a bilateral oophorectomy 10/22/2013 Remote history of cervical cancer. Iron deficiency anemia-resolved. Right calf deep vein thrombosis 10/24/2013-she completed 3 months of xarelto. Indeterminate 12 mm hypermetabolic right pelvic density on the staging PET scan 10/04/2013-CT followup recommended per the GI tumor conference 11/13/2013. No longer visualized on a CT 07/14/2014. Soft tissue implant overlying the posterior right liver on the CT 09/20/2013-not hypermetabolic on the PET scan 10/04/2013. C. difficile colitis 03/05/2014. She completed a course of Flagyl, recurrent diarrhea beginning 03/21/2014, positive C. difficile toxin 03/26/2014-resumed Flagyl for planned 10 day course. Vancomycin beginning 04/08/2014. Taper complete 05/21/2014.   9.  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  Xeloda dose/schedule adjusted beginning 01/28/2019. 10.  CVA-left centrum semiovale and left caudate 12/14/2021 11.  03/21/2022-implantation of loop recorder 12.  Fall 10/06/2022 with right forehead laceration 13.   01/26/2023-back pain/new onset leg weakness-CT thoracic spine with permeative destruction of the entirety of the T9 vertebral body; extraosseous extension of tumor into the surrounding paraspinal soft tissues as well as epidural tumor in the spinal canal, severe spinal canal stenosis with likely cord compression; possible pathologic fractures to the bilateral lamina/inferior articulating processes at T9; increase in size  of right infrahilar mass and additional metastases in both upper lobes; new small right pleural effusion.  Completed palliative radiation 01/27/2023 - 02/08/2023  Disposition: [name] has metastatic colon cancer.  She recently completed palliative radiation to the lower thoracic spine.  Pain and leg weakness have improved.  She will taper dexamethasone to 4 mg daily.  She is enrolled in the [facility] program.  Plan to continue supportive/comfort care approach.  She will return for a port flush and office visit in 4 to 5 weeks.  We are available to see her sooner if needed.  Patient seen with Dr. Truett Perna.    Lonna Cobb ANP/GNP-BC   02/15/2023  1:50 PM  This was a shared visit with Lonna Cobb.  Ms. Troyan was interviewed and examined.  She completed a course of palliative radiation to the pathologic T9 lesion.  Her back pain and leg weakness have improved.  She will continue with Decadron taper.  She has enrolled in home hospice care.  I was present for greater than 50% of today's visit.  I performed medical decision making.  Mancel Bale, MD

## 2023-02-27 ENCOUNTER — Ambulatory Visit (INDEPENDENT_AMBULATORY_CARE_PROVIDER_SITE_OTHER)

## 2023-02-27 DIAGNOSIS — I639 Cerebral infarction, unspecified: Secondary | ICD-10-CM | POA: Diagnosis not present

## 2023-02-28 LAB — CUP PACEART REMOTE DEVICE CHECK
Date Time Interrogation Session: 20240714231107
Implantable Pulse Generator Implant Date: 20230807

## 2023-03-14 NOTE — Progress Notes (Signed)
[facility] Summary Report / Loop Recorder 

## 2023-03-19 ENCOUNTER — Other Ambulatory Visit: Payer: Self-pay | Admitting: Neurology

## 2023-03-22 ENCOUNTER — Inpatient Hospital Stay: Admitting: Oncology

## 2023-03-22 ENCOUNTER — Inpatient Hospital Stay: Attending: Oncology

## 2023-03-22 VITALS — BP 122/73 | HR 84 | Temp 98.2°F | Resp 18 | Ht 60.0 in | Wt 105.0 lb

## 2023-03-22 DIAGNOSIS — C7800 Secondary malignant neoplasm of unspecified lung: Secondary | ICD-10-CM | POA: Insufficient documentation

## 2023-03-22 DIAGNOSIS — R0789 Other chest pain: Secondary | ICD-10-CM | POA: Insufficient documentation

## 2023-03-22 DIAGNOSIS — Z95828 Presence of other vascular implants and grafts: Secondary | ICD-10-CM

## 2023-03-22 DIAGNOSIS — C189 Malignant neoplasm of colon, unspecified: Secondary | ICD-10-CM | POA: Diagnosis not present

## 2023-03-22 DIAGNOSIS — C180 Malignant neoplasm of cecum: Secondary | ICD-10-CM | POA: Diagnosis present

## 2023-03-22 DIAGNOSIS — C787 Secondary malignant neoplasm of liver and intrahepatic bile duct: Secondary | ICD-10-CM

## 2023-03-22 MED ORDER — HEPARIN SOD (PORK) LOCK FLUSH 100 UNIT/ML IV SOLN
500.0000 [IU] | Freq: Once | INTRAVENOUS | Status: AC | PRN
  Administered 2023-03-22: 500 [IU]

## 2023-03-22 MED ORDER — SODIUM CHLORIDE 0.9% FLUSH
10.0000 mL | INTRAVENOUS | Status: DC | PRN
  Administered 2023-03-22: 10 mL

## 2023-03-22 NOTE — Progress Notes (Signed)
[facility] OFFICE PROGRESS NOTE   Diagnosis: Colon cancer  INTERVAL HISTORY:   Cynthia [name] returns as scheduled.  She has enrolled in home hospice care.  The home hospice nurse and nursing assistant are visiting regularly.  She is ambulatory in the home.  Good appetite.  She continues to have leg weakness.  She was evaluated by physical therapy and is performing home exercises.  She complains of tightness at both sides of the chest and discomfort in the left lateral chest.  She wonders whether the discomfort is related to the loop recorder.  Objective:  Vital signs in last 24 hours:  Blood pressure 122/73, pulse 84, temperature 98.2 F (36.8 C), temperature source Oral, resp. rate 18, height 5' (1.524 m), weight 105 lb (47.6 kg), SpO2 100%.    Resp: Decreased breath sounds at the right lower chest, no respiratory distress Cardio: Regular rate and rhythm GI: No hepatosplenomegaly Vascular: No leg edema Neuro: The motor exam appears intact in the legs bilaterally. Musculoskeletal: No tenderness at the chest wall, bony prominence at the right mid lateral chest, no mass or tenderness at the left lateral chest, no rash.  The loop recorder is present at the upper anterior left chest wall  Portacath/PICC-without erythema  Lab Results:  Lab Results  Component Value Date   WBC 8.6 09/20/2022   HGB 10.4 (L) 09/20/2022   HCT 32.8 (L) 09/20/2022   MCV 85.4 09/20/2022   PLT 312 09/20/2022   NEUTROABS 6.1 09/20/2022    CMP  Lab Results  Component Value Date   NA 139 09/20/2022   K 3.9 09/20/2022   CL 102 09/20/2022   CO2 28 09/20/2022   GLUCOSE 126 (H) 09/20/2022   BUN 14 09/20/2022   CREATININE 0.93 09/20/2022   CALCIUM 9.2 09/20/2022   PROT 6.8 09/20/2022   ALBUMIN 3.6 09/20/2022   AST 23 09/20/2022   ALT 21 09/20/2022   ALKPHOS 54 09/20/2022   BILITOT 0.3 09/20/2022   GFRNONAA >60 09/20/2022   GFRAA >60 10/08/2019    Lab Results  Component Value  Date   CEA1 4.27 05/24/2021   CEA 15.74 (H) 10/10/2022    Medications: I have reviewed the patient's current medications.   Assessment/Plan: Moderately differentiated adenocarcinoma of the cecum, stage III (T4a, N1), status post a laparoscopic assisted right colectomy 10/22/2013 The tumor returned microsatellite stable with normal mismatch repair protein expression   Cycle 1 adjuvant Xeloda 12/25/2013.   Cycle 2 adjuvant Xeloda 01/15/2014.   Cycle 3 adjuvant Xeloda 02/05/2014.   Cycle 4 adjuvant Xeloda 03/18/2014, discontinued after 4 days secondary to diarrhea   Cycle 5 adjuvant Xeloda 04/16/2014, Xeloda dose reduced to 1000 mg in the a.m. and 500 mg in the p.m.   Cycle 6 adjuvant Xeloda 05/07/2014. Cycle 7 adjuvant Xeloda 05/28/2014. Cycle 8 adjuvant Xeloda 06/18/2014. Restaging CTs on 07/14/2014 with no evidence of metastatic colon cancer Colonoscopy 10/02/2014 with diverticulosis in the sigmoid colon and in the descending colon. One 4 mm polyp in the sigmoid colon with complete resection. Polyp tissue not retrieved. Internal hemorrhoids. Next colonoscopy in 3 years for surveillance. Restaging CTs 07/16/2015 with 2 new hypodense liver lesions concerning for metastases, no other evidence of disease progression PET scan 07/28/2015 with a single hypermetabolic right liver lesion, no other evidence of metastatic disease Ultrasound-guided biopsy of the right liver lesion 07/30/2015 confirmed metastatic colon cancer, MSS, tumor mutation burden 1, KRAS G12D, PIK3CA, PTEN MRI abdomen 08/20/2015 with similar to slight decrease in  size of 2 hepatic metastasis. No new liver lesions or extrahepatic metastatic disease identified. Radiofrequency ablation of 2 liver lesions 09/25/2015 MRI abdomen 02/02/2016-coagulative necrosis at the 2 ablated liver tumor sites, with no enhancement to suggest residual or recurrent liver tumor. No new liver masses or other findings of metastatic disease in the  abdomen. MRI abdomen 08/10/2016 revealed stable ablation sites, no evidence of progressive metastatic disease Restaging CT 02/08/2017-stable treated liver lesions, no evidence of progressive metastatic disease, stable borderline enlarged right inguinal node  CEA with stable mild elevation 02/08/2017, 03/01/2017, 03/29/2017, 04/19/2017 CEA further elevated 08/21/2017 CTs 09/05/2017- possible local recurrence of tumor along the ablation site right hepatic lobe lesion.  Cephalad to the ablation site approximately 1.9 x 2.1 cm soft tissue mass progressive compared to the prior exam. Liver SBRT 10/17/2017, 10/19/2017, 10/23/2017, 10/25/2017, 10/27/2017 CT 02/19/2018- progressive ill-defined areas of low attenuation with enhancement surrounding the segment 6 ablation site, stable segment 8 ablation site enlargement of extracapsular lesion posteriorly, no new hepatic lesions, wall thickening/edema at the distal ileum CT 05/22/2018- 2 new lung nodules suspicious for metastases, stable segment 8 ablation zone, slight decrease in the size of delayed perfusion at the segment 6 ablation zone, posterior extracapsular lesion is slightly smaller CT chest 07/13/2018- multiple lung nodules consistent with metastatic disease Cycle 1 Xeloda 07/23/2018 Cycle 2 Xeloda 08/13/2018 Cycle 3 Xeloda 09/03/2018 Cycle 4 Xeloda 09/24/2018 Restaging chest CT 10/12/2018- scattered pulmonary nodules/metastases mildly improved. Cycle 5 Xeloda 10/15/2018 Cycle 6 Xeloda 11/05/2018 Cycle 7 Xeloda 11/26/2018 Cycle 8 Xeloda 12/17/2018 Cycle 9 Xeloda 01/07/2019 CT chest 01/24/2019- stable pulmonary nodules.  No new pulmonary nodules.  Stable left hepatic lobe cysts and right hepatic lobe lesions.  No new hepatic lesions.  Stable right ninth and 10th rib lesions. Xeloda daily dosing schedule beginning 01/28/2019 due to hand-foot syndrome CT 05/27/2019- enlargement of lung metastases, similar right hepatic lobe lesion CT 08/05/2019-enlargement of bilateral lung  nodules, no new lesions, stable right hepatic lesion Cycle 1 FOLFIRI 08/13/2019 Cycle 2 FOLFIRI 09/04/2019, Irinotecan dose reduced due to neutropenia Cycle 3 FOLFIRI 09/17/2019 Cycle 4 FOLFIRI 10/08/2019, 5-FU and irinotecan dose reduced CT chest 10/28/2019-bilateral pulmonary metastases with minimal improvement.  Stable ablation zone in the posterior right hepatic lobe. CT chest 03/03/2020-enlargement of pulmonary metastases, decreased right hepatic ablation site CT chest 07/07/2020-enlargement of pulmonary metastases, stable ablation site involving right hepatic lobe Cycle 1 FOLFIRI/Avastin 07/27/2020 Cycle 2 FOLFIRI/Avastin 08/18/2020 Treatment held 09/08/2020 due to neutropenia Cycle 3 FOLFIRI/Avastin 09/17/2020 (irinotecan dose reduced due to neutropenia) Cycle 4 FOLFIRI/Avastin 10/06/2020 Chemotherapy discontinued per patient request CT chest 11/16/2020-decrease in size of dominant right perihilar nodule, other lung nodules are stable, stable right liver ablation site CTs 07/28/2022-increase size of numerous pulmonary metastases, increased number and size of numerous bone lesions; mixed lucency and sclerosis throughout the T9 vertebral body, mild compression and approximately 20% loss of central vertebral body height most compatible with a metastatic lesion with pathologic compression fracture; expansile sclerotic lesions noted in the lateral aspect of the right ninth and 10th ribs as well as the anterolateral aspect of the left sixth rib which also has surrounding soft tissue component. Cycle 1 FOLFIRI/Avastin 08/09/2022 Cycle 2 FOLFIRI/Avastin 08/29/2022, Emend and prophylactic dexamethasone added No further chemotherapy per patient due to poor tolerance Right ovary cystadenofibroma, status post a bilateral oophorectomy 10/22/2013 Remote history of cervical cancer. Iron deficiency anemia-resolved. Right calf deep vein thrombosis 10/24/2013-she completed 3 months of xarelto. Indeterminate 12 mm  hypermetabolic right pelvic density on the staging PET  scan 10/04/2013-CT followup recommended per the GI tumor conference 11/13/2013. No longer visualized on a CT 07/14/2014. Soft tissue implant overlying the posterior right liver on the CT 09/20/2013-not hypermetabolic on the PET scan 10/04/2013. C. difficile colitis 03/05/2014. She completed a course of Flagyl, recurrent diarrhea beginning 03/21/2014, positive C. difficile toxin 03/26/2014-resumed Flagyl for planned 10 day course. Vancomycin beginning 04/08/2014. Taper complete 05/21/2014.   9.  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  Xeloda dose/schedule adjusted beginning 01/28/2019. 10.  CVA-left centrum semiovale and left caudate 12/14/2021 11.  03/21/2022-implantation of loop recorder 12.  Fall 10/06/2022 with right forehead laceration 13.  01/26/2023-back pain/new onset leg weakness-CT thoracic spine with permeative destruction of the entirety of the T9 vertebral body; extraosseous extension of tumor into the surrounding paraspinal soft tissues as well as epidural tumor in the spinal canal, severe spinal canal stenosis with likely cord compression; possible pathologic fractures to the bilateral lamina/inferior articulating processes at T9; increase in size of right infrahilar mass and additional metastases in both upper lobes; new small right pleural effusion.  Completed palliative radiation 01/27/2023 - 02/08/2023    Disposition: Cynthia [name] has metastatic colon cancer.  She has enrolled in home hospice care.  Her overall status appears stable.  The discomfort at the chest wall with likely radicular pain from the thoracic spine tumor.  This area was treated with radiation in June.  She feels gabapentin helps the discomfort.  She will continue gabapentin.  She will call for increased pain or new neurologic symptoms.  I will recommend resuming Decadron if her symptoms progress.  She will return for an office visit and  Port-A-Cath flush in 6 weeks.  Thornton Papas, MD  03/22/2023  9:52 AM

## 2023-03-29 ENCOUNTER — Encounter: Payer: Self-pay | Admitting: Oncology

## 2023-04-03 ENCOUNTER — Ambulatory Visit (INDEPENDENT_AMBULATORY_CARE_PROVIDER_SITE_OTHER)

## 2023-04-03 DIAGNOSIS — I639 Cerebral infarction, unspecified: Secondary | ICD-10-CM

## 2023-04-03 LAB — CUP PACEART REMOTE DEVICE CHECK
Date Time Interrogation Session: 20240818230746
Implantable Pulse Generator Implant Date: 20230807

## 2023-04-05 ENCOUNTER — Telehealth: Payer: Self-pay | Admitting: *Deleted

## 2023-04-05 NOTE — Telephone Encounter (Signed)
Daughter left VM to report the Tramadol is no longer controlling her pain. Asking for stronger medication; had a rough night. Called patient back and she informed nurse that Hospice nurse saw her and have started her on oxycodone. She took her 1st dose and is already feeling better.

## 2023-04-06 ENCOUNTER — Telehealth: Payer: Self-pay | Admitting: *Deleted

## 2023-04-06 MED ORDER — DEXAMETHASONE 4 MG PO TABS: 4.0000 mg | ORAL_TABLET | Freq: Two times a day (BID) | ORAL | 1 refills | Status: DC

## 2023-04-06 NOTE — Telephone Encounter (Signed)
Daughter reports Cynthia [name] is having severe squeezing pain from her back around to chest area. Hospice ordered oxycodone 5 mg every 4 hours and has increased today to 10 mg every 4 hours and still not getting any relief. Also feels as if she can't catch her breath. Has been coughing more, but no fever. Asking if she needs to be seen?

## 2023-04-06 NOTE — Telephone Encounter (Signed)
Per Dr. Truett Perna: Take dexamethasone 8 mg now, then 4 mg twice daily and may increase the oxycodone to 20 mg every 4 hours as needed. Informed her she has a tumor pressing on her spine causing all this pain. She will call in am with update on her status. [facility] nurse, Arlys John with above interventions.

## 2023-04-10 ENCOUNTER — Ambulatory Visit
Admission: RE | Admit: 2023-04-10 | Discharge: 2023-04-10 | Disposition: A | Discharge status: Home / Self Care | Payer: Medicare PPO | Source: Ambulatory Visit | Attending: Radiation Oncology | Admitting: Radiation Oncology

## 2023-04-10 NOTE — Progress Notes (Signed)
  Radiation Oncology         ([phone]) [phone] ________________________________  Name: Cynthia [name] MRN: [id number]  Date of Service: 04/10/2023  DOB: October 03, 1935  Post Treatment Telephone Note  Diagnosis:  Stage IV colon cancer with metastasis to the thoracic spine.   (as documented in provider EOT note)   The patient was available for call today.  The patient did  note fatigue during radiation but has since improved. The patient did not note skin changes in the field of radiation during therapy. The patient has not noticed improvement in pain in the area(s) treated with radiation.   The patient is not taking dexamethasone. She took 2 doses but did not finish the RX, stating "It made her feel out of sorts." Dr. Truett Perna is aware that pt. self-stopped this RX.   The patient does not have symptoms of  weakness or loss of control of the extremities. The patient does not have symptoms of headache. The patient does not have symptoms of seizure or uncontrolled movement. The patient does not have symptoms of changes in vision. The patient does not have changes in speech. The patient does not have confusion.   The patient is scheduled for ongoing care with Dr. Truett Perna  in medical oncology. The patient was encouraged to call if shedevelops concerns or questions regarding radiation.   This concludes the interaction.  Ruel Favors, LPN

## 2023-04-14 NOTE — Progress Notes (Signed)
[facility] Summary Report / Loop Recorder 

## 2023-05-02 ENCOUNTER — Inpatient Hospital Stay: Admitting: Oncology

## 2023-05-02 ENCOUNTER — Inpatient Hospital Stay: Attending: Oncology

## 2023-05-02 VITALS — BP 109/67 | HR 76 | Temp 98.2°F | Resp 18 | Ht 60.0 in | Wt 105.0 lb

## 2023-05-02 DIAGNOSIS — C787 Secondary malignant neoplasm of liver and intrahepatic bile duct: Secondary | ICD-10-CM

## 2023-05-02 DIAGNOSIS — C189 Malignant neoplasm of colon, unspecified: Secondary | ICD-10-CM

## 2023-05-02 DIAGNOSIS — C180 Malignant neoplasm of cecum: Secondary | ICD-10-CM | POA: Insufficient documentation

## 2023-05-02 DIAGNOSIS — C7800 Secondary malignant neoplasm of unspecified lung: Secondary | ICD-10-CM | POA: Insufficient documentation

## 2023-05-02 DIAGNOSIS — Z95828 Presence of other vascular implants and grafts: Secondary | ICD-10-CM

## 2023-05-02 MED ORDER — HEPARIN SOD (PORK) LOCK FLUSH 100 UNIT/ML IV SOLN
500.0000 [IU] | Freq: Once | INTRAVENOUS | Status: AC | PRN
  Administered 2023-05-02: 500 [IU]

## 2023-05-02 MED ORDER — SODIUM CHLORIDE 0.9% FLUSH
10.0000 mL | INTRAVENOUS | Status: DC | PRN
  Administered 2023-05-02: 10 mL

## 2023-05-02 NOTE — Progress Notes (Signed)
[facility] OFFICE PROGRESS NOTE   Diagnosis: Colon cancer  INTERVAL HISTORY:   Cynthia [name] returns as scheduled.  She is here with her daughter and son-in-law.  She is followed by the home hospice program.  She continues gabapentin and oxycodone for pain at the lower chest/upper abdomen.  She feels a tightness across the chest.  She became severely constipated approximately 10 days ago.  She has not been taking MiraLAX.  She stays in bed most of the time.  Her ambulation is limited by leg weakness.  Objective:  Vital signs in last 24 hours:  Blood pressure 109/67, pulse 76, temperature 98.2 F (36.8 C), temperature source Temporal, resp. rate 18, height 5' (1.524 m), weight 105 lb (47.6 kg), SpO2 99%.    HEENT: No thrush Resp: Distant breath sounds, no respiratory distress Cardio: Regular rate and rhythm Vascular: No leg edema Neuro: The motor exam appears intact in the upper and lower extremities bilaterally with 4+/5 strength with flexion at the right hip.  She is able to stand independently and takes a few steps with an unsteady gait. Skin: No mass at the right chest wall  Portacath/PICC-without erythema  Lab Results:  Lab Results  Component Value Date   WBC 8.6 09/20/2022   HGB 10.4 (L) 09/20/2022   HCT 32.8 (L) 09/20/2022   MCV 85.4 09/20/2022   PLT 312 09/20/2022   NEUTROABS 6.1 09/20/2022    CMP  Lab Results  Component Value Date   NA 139 09/20/2022   K 3.9 09/20/2022   CL 102 09/20/2022   CO2 28 09/20/2022   GLUCOSE 126 (H) 09/20/2022   BUN 14 09/20/2022   CREATININE 0.93 09/20/2022   CALCIUM 9.2 09/20/2022   PROT 6.8 09/20/2022   ALBUMIN 3.6 09/20/2022   AST 23 09/20/2022   ALT 21 09/20/2022   ALKPHOS 54 09/20/2022   BILITOT 0.3 09/20/2022   GFRNONAA >60 09/20/2022   GFRAA >60 10/08/2019    Lab Results  Component Value Date   CEA1 4.27 05/24/2021   CEA 15.74 (H) 10/10/2022   Medications: I have reviewed the patient's current  medications.   Assessment/Plan: Moderately differentiated adenocarcinoma of the cecum, stage III (T4a, N1), status post a laparoscopic assisted right colectomy 10/22/2013 The tumor returned microsatellite stable with normal mismatch repair protein expression   Cycle 1 adjuvant Xeloda 12/25/2013.   Cycle 2 adjuvant Xeloda 01/15/2014.   Cycle 3 adjuvant Xeloda 02/05/2014.   Cycle 4 adjuvant Xeloda 03/18/2014, discontinued after 4 days secondary to diarrhea   Cycle 5 adjuvant Xeloda 04/16/2014, Xeloda dose reduced to 1000 mg in the a.m. and 500 mg in the p.m.   Cycle 6 adjuvant Xeloda 05/07/2014. Cycle 7 adjuvant Xeloda 05/28/2014. Cycle 8 adjuvant Xeloda 06/18/2014. Restaging CTs on 07/14/2014 with no evidence of metastatic colon cancer Colonoscopy 10/02/2014 with diverticulosis in the sigmoid colon and in the descending colon. One 4 mm polyp in the sigmoid colon with complete resection. Polyp tissue not retrieved. Internal hemorrhoids. Next colonoscopy in 3 years for surveillance. Restaging CTs 07/16/2015 with 2 new hypodense liver lesions concerning for metastases, no other evidence of disease progression PET scan 07/28/2015 with a single hypermetabolic right liver lesion, no other evidence of metastatic disease Ultrasound-guided biopsy of the right liver lesion 07/30/2015 confirmed metastatic colon cancer, MSS, tumor mutation burden 1, KRAS G12D, PIK3CA, PTEN MRI abdomen 08/20/2015 with similar to slight decrease in size of 2 hepatic metastasis. No new liver lesions or extrahepatic metastatic disease identified. Radiofrequency  ablation of 2 liver lesions 09/25/2015 MRI abdomen 02/02/2016-coagulative necrosis at the 2 ablated liver tumor sites, with no enhancement to suggest residual or recurrent liver tumor. No new liver masses or other findings of metastatic disease in the abdomen. MRI abdomen 08/10/2016 revealed stable ablation sites, no evidence of progressive metastatic disease Restaging  CT 02/08/2017-stable treated liver lesions, no evidence of progressive metastatic disease, stable borderline enlarged right inguinal node  CEA with stable mild elevation 02/08/2017, 03/01/2017, 03/29/2017, 04/19/2017 CEA further elevated 08/21/2017 CTs 09/05/2017- possible local recurrence of tumor along the ablation site right hepatic lobe lesion.  Cephalad to the ablation site approximately 1.9 x 2.1 cm soft tissue mass progressive compared to the prior exam. Liver SBRT 10/17/2017, 10/19/2017, 10/23/2017, 10/25/2017, 10/27/2017 CT 02/19/2018- progressive ill-defined areas of low attenuation with enhancement surrounding the segment 6 ablation site, stable segment 8 ablation site enlargement of extracapsular lesion posteriorly, no new hepatic lesions, wall thickening/edema at the distal ileum CT 05/22/2018- 2 new lung nodules suspicious for metastases, stable segment 8 ablation zone, slight decrease in the size of delayed perfusion at the segment 6 ablation zone, posterior extracapsular lesion is slightly smaller CT chest 07/13/2018- multiple lung nodules consistent with metastatic disease Cycle 1 Xeloda 07/23/2018 Cycle 2 Xeloda 08/13/2018 Cycle 3 Xeloda 09/03/2018 Cycle 4 Xeloda 09/24/2018 Restaging chest CT 10/12/2018- scattered pulmonary nodules/metastases mildly improved. Cycle 5 Xeloda 10/15/2018 Cycle 6 Xeloda 11/05/2018 Cycle 7 Xeloda 11/26/2018 Cycle 8 Xeloda 12/17/2018 Cycle 9 Xeloda 01/07/2019 CT chest 01/24/2019- stable pulmonary nodules.  No new pulmonary nodules.  Stable left hepatic lobe cysts and right hepatic lobe lesions.  No new hepatic lesions.  Stable right ninth and 10th rib lesions. Xeloda daily dosing schedule beginning 01/28/2019 due to hand-foot syndrome CT 05/27/2019- enlargement of lung metastases, similar right hepatic lobe lesion CT 08/05/2019-enlargement of bilateral lung nodules, no new lesions, stable right hepatic lesion Cycle 1 FOLFIRI 08/13/2019 Cycle 2 FOLFIRI 09/04/2019, Irinotecan  dose reduced due to neutropenia Cycle 3 FOLFIRI 09/17/2019 Cycle 4 FOLFIRI 10/08/2019, 5-FU and irinotecan dose reduced CT chest 10/28/2019-bilateral pulmonary metastases with minimal improvement.  Stable ablation zone in the posterior right hepatic lobe. CT chest 03/03/2020-enlargement of pulmonary metastases, decreased right hepatic ablation site CT chest 07/07/2020-enlargement of pulmonary metastases, stable ablation site involving right hepatic lobe Cycle 1 FOLFIRI/Avastin 07/27/2020 Cycle 2 FOLFIRI/Avastin 08/18/2020 Treatment held 09/08/2020 due to neutropenia Cycle 3 FOLFIRI/Avastin 09/17/2020 (irinotecan dose reduced due to neutropenia) Cycle 4 FOLFIRI/Avastin 10/06/2020 Chemotherapy discontinued per patient request CT chest 11/16/2020-decrease in size of dominant right perihilar nodule, other lung nodules are stable, stable right liver ablation site CTs 07/28/2022-increase size of numerous pulmonary metastases, increased number and size of numerous bone lesions; mixed lucency and sclerosis throughout the T9 vertebral body, mild compression and approximately 20% loss of central vertebral body height most compatible with a metastatic lesion with pathologic compression fracture; expansile sclerotic lesions noted in the lateral aspect of the right ninth and 10th ribs as well as the anterolateral aspect of the left sixth rib which also has surrounding soft tissue component. Cycle 1 FOLFIRI/Avastin 08/09/2022 Cycle 2 FOLFIRI/Avastin 08/29/2022, Emend and prophylactic dexamethasone added No further chemotherapy per patient due to poor tolerance Right ovary cystadenofibroma, status post a bilateral oophorectomy 10/22/2013 Remote history of cervical cancer. Iron deficiency anemia-resolved. Right calf deep vein thrombosis 10/24/2013-she completed 3 months of xarelto. Indeterminate 12 mm hypermetabolic right pelvic density on the staging PET scan 10/04/2013-CT followup recommended per the GI tumor conference  11/13/2013. No longer visualized on a  CT 07/14/2014. Soft tissue implant overlying the posterior right liver on the CT 09/20/2013-not hypermetabolic on the PET scan 10/04/2013. C. difficile colitis 03/05/2014. She completed a course of Flagyl, recurrent diarrhea beginning 03/21/2014, positive C. difficile toxin 03/26/2014-resumed Flagyl for planned 10 day course. Vancomycin beginning 04/08/2014. Taper complete 05/21/2014.   9.  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  Xeloda dose/schedule adjusted beginning 01/28/2019. 10.  CVA-left centrum semiovale and left caudate 12/14/2021 11.  03/21/2022-implantation of loop recorder 12.  Fall 10/06/2022 with right forehead laceration 13.  01/26/2023-back pain/new onset leg weakness-CT thoracic spine with permeative destruction of the entirety of the T9 vertebral body; extraosseous extension of tumor into the surrounding paraspinal soft tissues as well as epidural tumor in the spinal canal, severe spinal canal stenosis with likely cord compression; possible pathologic fractures to the bilateral lamina/inferior articulating processes at T9; increase in size of right infrahilar mass and additional metastases in both upper lobes; new small right pleural effusion.  Completed palliative radiation 01/27/2023 - 02/08/2023      Disposition: [name] has metastatic colon cancer.  She is enrolled in home hospice care.  Her overall clinical status appears unchanged.  Constipation is likely related to narcotic use.  She will begin daily MiraLAX in addition to a stool softener.  Leg weakness is likely secondary to tumor at T9.  She understands treatment options are limited for the chronic cord compression.  She will attempt to ambulate by using a walker and only with assistance.  She would like to continue follow-up at the cancer center.  She will return for an office visit and Port-A-Cath flush in 6 weeks.  I am available to see her sooner as  needed.  She will contact us for increased leg weakness or new neurologic symptoms.  We will plan for a trial of Decadron for increased pain or weakness.  Thornton Papas, MD  05/02/2023  10:51 AM

## 2023-05-04 ENCOUNTER — Telehealth: Payer: Self-pay | Admitting: *Deleted

## 2023-05-04 MED ORDER — DEXAMETHASONE 4 MG PO TABS: 4.0000 mg | ORAL_TABLET | Freq: Two times a day (BID) | ORAL | 0 refills | Status: AC

## 2023-05-04 NOTE — Telephone Encounter (Addendum)
Called patient to clarify her current problems and what she wants. She is having pain left torso area and more nausea. She does not want chemotherapy. She is willing to try decadron for her pain. Informed her this may also help her nausea and appetite. Daughter is pushing treatment because "she wants to fix me and I can't be fixed".  Dr. Truett Perna has ordered dexamethasone 4 mg bid x 1 week, then 4 mg daily. Call back in 1 week w/update on pain/nausea. Daughter called back to request an appointment to be seen and examined since she has a new lump on her back that is new. Asking if she can have chemo and Hospice at the same time and was told no. Reminded her that Cynthia [name] does not want chemo. Scheduling message sent to have her come to be seen and talk.

## 2023-05-04 NOTE — Telephone Encounter (Addendum)
Due to increase in pain, patient is requesting to have treatment again. Asking what would be the treatment and when could it start? Please call back at [phone]

## 2023-05-08 ENCOUNTER — Ambulatory Visit (INDEPENDENT_AMBULATORY_CARE_PROVIDER_SITE_OTHER)

## 2023-05-08 DIAGNOSIS — I639 Cerebral infarction, unspecified: Secondary | ICD-10-CM

## 2023-05-09 LAB — CUP PACEART REMOTE DEVICE CHECK
Date Time Interrogation Session: 20240920230101
Implantable Pulse Generator Implant Date: 20230807

## 2023-05-12 ENCOUNTER — Telehealth: Payer: Self-pay | Admitting: Oncology

## 2023-05-12 ENCOUNTER — Inpatient Hospital Stay: Admitting: Nurse Practitioner

## 2023-05-12 NOTE — Telephone Encounter (Signed)
Patient daughter called mother not feeling good

## 2023-05-21 ENCOUNTER — Other Ambulatory Visit: Payer: Self-pay | Admitting: Neurology

## 2023-05-22 NOTE — Telephone Encounter (Signed)
Called and left message that she needs to call PCP to get Atorvastatin refilled per Dr. Everlena Cooper.

## 2023-05-23 NOTE — Progress Notes (Signed)
[facility] Summary Report / Loop Recorder 

## 2023-05-25 ENCOUNTER — Encounter: Payer: Self-pay | Admitting: Oncology

## 2023-05-31 ENCOUNTER — Encounter: Payer: Self-pay | Admitting: Oncology

## 2023-06-01 ENCOUNTER — Encounter: Payer: Self-pay | Admitting: Oncology

## 2023-06-12 ENCOUNTER — Ambulatory Visit (INDEPENDENT_AMBULATORY_CARE_PROVIDER_SITE_OTHER)

## 2023-06-12 DIAGNOSIS — I639 Cerebral infarction, unspecified: Secondary | ICD-10-CM | POA: Diagnosis not present

## 2023-06-12 LAB — CUP PACEART REMOTE DEVICE CHECK
Date Time Interrogation Session: 20241027231349
Implantable Pulse Generator Implant Date: 20230807

## 2023-06-15 ENCOUNTER — Inpatient Hospital Stay: Payer: Medicare PPO | Admitting: Oncology

## 2023-06-15 ENCOUNTER — Inpatient Hospital Stay: Payer: Medicare PPO | Attending: Oncology

## 2023-06-16 DEATH — deceased

## 2023-06-21 ENCOUNTER — Telehealth: Payer: Self-pay | Admitting: *Deleted

## 2023-06-21 NOTE — Telephone Encounter (Signed)
Called to f/u on "no show" for MD visit last week. Was informed by daughter that she died at [facility] on 2023/06/27. MD notified.

## 2023-07-03 NOTE — Progress Notes (Signed)
[facility] Summary Report / Loop Recorder 

## 2023-07-05 ENCOUNTER — Encounter: Payer: Self-pay | Admitting: Oncology

## 2023-07-05 NOTE — Telephone Encounter (Signed)
Telephone call  

## 2023-07-10 ENCOUNTER — Encounter: Payer: Self-pay | Admitting: Oncology

## 2023-12-05 ENCOUNTER — Encounter: Payer: Self-pay | Admitting: Oncology
